# Patient Record
Sex: Female | Born: 1950 | Race: White | Hispanic: No | Smoking: Never smoker
Health system: Southern US, Community
[De-identification: ages and names within clinical notes are randomized; demographics above are authoritative.]

## PROBLEM LIST (undated history)

## (undated) DIAGNOSIS — K589 Irritable bowel syndrome without diarrhea: Secondary | ICD-10-CM

## (undated) DIAGNOSIS — E7800 Pure hypercholesterolemia, unspecified: Secondary | ICD-10-CM

## (undated) DIAGNOSIS — K209 Esophagitis, unspecified without bleeding: Secondary | ICD-10-CM

## (undated) DIAGNOSIS — G4730 Sleep apnea, unspecified: Secondary | ICD-10-CM

## (undated) DIAGNOSIS — R011 Cardiac murmur, unspecified: Secondary | ICD-10-CM

## (undated) DIAGNOSIS — I739 Peripheral vascular disease, unspecified: Secondary | ICD-10-CM

## (undated) DIAGNOSIS — J45909 Unspecified asthma, uncomplicated: Secondary | ICD-10-CM

## (undated) DIAGNOSIS — T4145XA Adverse effect of unspecified anesthetic, initial encounter: Secondary | ICD-10-CM

## (undated) DIAGNOSIS — Z86010 Personal history of colonic polyps: Secondary | ICD-10-CM

## (undated) DIAGNOSIS — T8859XA Other complications of anesthesia, initial encounter: Secondary | ICD-10-CM

## (undated) DIAGNOSIS — D510 Vitamin B12 deficiency anemia due to intrinsic factor deficiency: Secondary | ICD-10-CM

## (undated) DIAGNOSIS — J449 Chronic obstructive pulmonary disease, unspecified: Secondary | ICD-10-CM

## (undated) DIAGNOSIS — I10 Essential (primary) hypertension: Secondary | ICD-10-CM

## (undated) DIAGNOSIS — R634 Abnormal weight loss: Secondary | ICD-10-CM

## (undated) DIAGNOSIS — H269 Unspecified cataract: Secondary | ICD-10-CM

## (undated) DIAGNOSIS — Z87442 Personal history of urinary calculi: Secondary | ICD-10-CM

## (undated) DIAGNOSIS — K529 Noninfective gastroenteritis and colitis, unspecified: Secondary | ICD-10-CM

## (undated) DIAGNOSIS — K219 Gastro-esophageal reflux disease without esophagitis: Secondary | ICD-10-CM

## (undated) DIAGNOSIS — L4050 Arthropathic psoriasis, unspecified: Secondary | ICD-10-CM

## (undated) DIAGNOSIS — Z8774 Personal history of (corrected) congenital malformations of heart and circulatory system: Secondary | ICD-10-CM

## (undated) DIAGNOSIS — D649 Anemia, unspecified: Secondary | ICD-10-CM

## (undated) DIAGNOSIS — E119 Type 2 diabetes mellitus without complications: Secondary | ICD-10-CM

## (undated) DIAGNOSIS — J189 Pneumonia, unspecified organism: Secondary | ICD-10-CM

## (undated) DIAGNOSIS — Z8719 Personal history of other diseases of the digestive system: Secondary | ICD-10-CM

## (undated) DIAGNOSIS — R945 Abnormal results of liver function studies: Secondary | ICD-10-CM

## (undated) DIAGNOSIS — G709 Myoneural disorder, unspecified: Secondary | ICD-10-CM

## (undated) DIAGNOSIS — T7840XA Allergy, unspecified, initial encounter: Secondary | ICD-10-CM

## (undated) DIAGNOSIS — N200 Calculus of kidney: Secondary | ICD-10-CM

## (undated) DIAGNOSIS — D689 Coagulation defect, unspecified: Secondary | ICD-10-CM

## (undated) HISTORY — DX: Abnormal results of liver function studies: R94.5

## (undated) HISTORY — DX: Personal history of colonic polyps: Z86.010

## (undated) HISTORY — PX: CHOLECYSTECTOMY: SHX55

## (undated) HISTORY — DX: Pure hypercholesterolemia, unspecified: E78.00

## (undated) HISTORY — DX: Noninfective gastroenteritis and colitis, unspecified: K52.9

## (undated) HISTORY — DX: Arthropathic psoriasis, unspecified: L40.50

## (undated) HISTORY — PX: UMBILICAL HERNIA REPAIR: SHX196

## (undated) HISTORY — DX: Chronic obstructive pulmonary disease, unspecified: J44.9

## (undated) HISTORY — PX: NOSE SURGERY: SHX723

## (undated) HISTORY — DX: Type 2 diabetes mellitus without complications: E11.9

## (undated) HISTORY — DX: Allergy, unspecified, initial encounter: T78.40XA

## (undated) HISTORY — DX: Personal history of (corrected) congenital malformations of heart and circulatory system: Z87.74

## (undated) HISTORY — DX: Unspecified cataract: H26.9

## (undated) HISTORY — PX: ABDOMINAL HYSTERECTOMY: SHX81

## (undated) HISTORY — DX: Myoneural disorder, unspecified: G70.9

## (undated) HISTORY — PX: HAND SURGERY: SHX662

## (undated) HISTORY — PX: BUNIONECTOMY: SHX129

## (undated) HISTORY — DX: Esophagitis, unspecified without bleeding: K20.90

## (undated) HISTORY — DX: Irritable bowel syndrome, unspecified: K58.9

## (undated) HISTORY — DX: Vitamin B12 deficiency anemia due to intrinsic factor deficiency: D51.0

## (undated) HISTORY — DX: Essential (primary) hypertension: I10

## (undated) HISTORY — DX: Calculus of kidney: N20.0

## (undated) HISTORY — PX: ANKLE SURGERY: SHX546

## (undated) HISTORY — PX: BRAIN SURGERY: SHX531

## (undated) HISTORY — PX: HERNIA REPAIR: SHX51

## (undated) HISTORY — DX: Coagulation defect, unspecified: D68.9

## (undated) HISTORY — DX: Personal history of other diseases of the digestive system: Z87.19

## (undated) HISTORY — DX: Anemia, unspecified: D64.9

## (undated) HISTORY — DX: Peripheral vascular disease, unspecified: I73.9

## (undated) HISTORY — DX: Esophagitis, unspecified: K20.9

## (undated) HISTORY — DX: Sleep apnea, unspecified: G47.30

## (undated) HISTORY — PX: HEEL SPUR SURGERY: SHX665

## (undated) HISTORY — DX: Abnormal weight loss: R63.4

## (undated) HISTORY — PX: EYE SURGERY: SHX253

## (undated) MED FILL — Ferric Derisomaltose (One Dose) IV Sol 1000 MG/10ML (Fe Eq): INTRAVENOUS | Qty: 10 | Status: AC

---

## 1979-11-03 HISTORY — PX: ABDOMINAL HYSTERECTOMY: SHX81

## 1987-11-03 HISTORY — PX: CHOLECYSTECTOMY: SHX55

## 2004-09-10 ENCOUNTER — Ambulatory Visit: Payer: Self-pay

## 2004-09-11 ENCOUNTER — Ambulatory Visit: Payer: Self-pay

## 2005-09-07 ENCOUNTER — Ambulatory Visit: Payer: Self-pay | Admitting: Internal Medicine

## 2006-04-30 ENCOUNTER — Ambulatory Visit: Payer: Self-pay | Admitting: Podiatry

## 2007-01-10 ENCOUNTER — Ambulatory Visit: Payer: Self-pay | Admitting: Internal Medicine

## 2007-01-20 ENCOUNTER — Ambulatory Visit: Payer: Self-pay | Admitting: Internal Medicine

## 2007-11-29 ENCOUNTER — Ambulatory Visit: Payer: Self-pay | Admitting: Ophthalmology

## 2007-12-05 ENCOUNTER — Ambulatory Visit: Payer: Self-pay | Admitting: Ophthalmology

## 2008-01-23 ENCOUNTER — Ambulatory Visit: Payer: Self-pay | Admitting: Internal Medicine

## 2008-05-22 ENCOUNTER — Ambulatory Visit: Payer: Self-pay | Admitting: Family Medicine

## 2008-05-24 ENCOUNTER — Ambulatory Visit: Payer: Self-pay | Admitting: Internal Medicine

## 2008-05-29 ENCOUNTER — Ambulatory Visit: Payer: Self-pay | Admitting: Rheumatology

## 2008-12-26 ENCOUNTER — Ambulatory Visit: Payer: Self-pay | Admitting: Internal Medicine

## 2009-03-18 ENCOUNTER — Ambulatory Visit: Payer: Self-pay | Admitting: Internal Medicine

## 2009-04-02 ENCOUNTER — Ambulatory Visit: Payer: Self-pay | Admitting: Internal Medicine

## 2010-03-31 ENCOUNTER — Ambulatory Visit: Payer: Self-pay | Admitting: Internal Medicine

## 2010-11-26 ENCOUNTER — Encounter: Payer: Self-pay | Admitting: Rheumatology

## 2010-12-03 ENCOUNTER — Encounter: Payer: Self-pay | Admitting: Rheumatology

## 2011-04-23 ENCOUNTER — Ambulatory Visit: Payer: Self-pay | Admitting: Internal Medicine

## 2012-04-28 ENCOUNTER — Ambulatory Visit: Payer: Self-pay | Admitting: Internal Medicine

## 2012-04-29 ENCOUNTER — Ambulatory Visit: Payer: Self-pay | Admitting: Specialist

## 2012-05-10 ENCOUNTER — Encounter: Payer: Self-pay | Admitting: Specialist

## 2012-06-02 ENCOUNTER — Encounter: Payer: Self-pay | Admitting: Specialist

## 2012-06-29 DIAGNOSIS — K8050 Calculus of bile duct without cholangitis or cholecystitis without obstruction: Secondary | ICD-10-CM

## 2012-06-29 DIAGNOSIS — L4050 Arthropathic psoriasis, unspecified: Secondary | ICD-10-CM | POA: Insufficient documentation

## 2012-06-29 DIAGNOSIS — R748 Abnormal levels of other serum enzymes: Secondary | ICD-10-CM | POA: Insufficient documentation

## 2012-06-29 HISTORY — DX: Calculus of bile duct without cholangitis or cholecystitis without obstruction: K80.50

## 2012-07-03 ENCOUNTER — Encounter: Payer: Self-pay | Admitting: Specialist

## 2012-09-28 ENCOUNTER — Telehealth: Payer: Self-pay | Admitting: Internal Medicine

## 2012-09-28 NOTE — Telephone Encounter (Signed)
Refill request for accu check comp drum tes #102 Sig: check blood sugar 2 to 3 times a day Patient has an appt on 10/06/12

## 2012-10-04 ENCOUNTER — Encounter: Payer: Self-pay | Admitting: Internal Medicine

## 2012-10-06 ENCOUNTER — Encounter: Payer: Self-pay | Admitting: Internal Medicine

## 2012-10-06 ENCOUNTER — Ambulatory Visit (INDEPENDENT_AMBULATORY_CARE_PROVIDER_SITE_OTHER): Payer: Medicare Other | Admitting: Internal Medicine

## 2012-10-06 VITALS — BP 109/74 | HR 90 | Temp 98.0°F | Ht 65.0 in | Wt 167.0 lb

## 2012-10-06 DIAGNOSIS — K589 Irritable bowel syndrome without diarrhea: Secondary | ICD-10-CM

## 2012-10-06 DIAGNOSIS — R945 Abnormal results of liver function studies: Secondary | ICD-10-CM

## 2012-10-06 DIAGNOSIS — E119 Type 2 diabetes mellitus without complications: Secondary | ICD-10-CM

## 2012-10-06 DIAGNOSIS — K5520 Angiodysplasia of colon without hemorrhage: Secondary | ICD-10-CM

## 2012-10-06 DIAGNOSIS — D649 Anemia, unspecified: Secondary | ICD-10-CM

## 2012-10-06 DIAGNOSIS — R7989 Other specified abnormal findings of blood chemistry: Secondary | ICD-10-CM

## 2012-10-06 DIAGNOSIS — I10 Essential (primary) hypertension: Secondary | ICD-10-CM

## 2012-10-06 DIAGNOSIS — M8580 Other specified disorders of bone density and structure, unspecified site: Secondary | ICD-10-CM

## 2012-10-06 DIAGNOSIS — E7800 Pure hypercholesterolemia, unspecified: Secondary | ICD-10-CM

## 2012-10-06 DIAGNOSIS — K219 Gastro-esophageal reflux disease without esophagitis: Secondary | ICD-10-CM

## 2012-10-06 DIAGNOSIS — Q2733 Arteriovenous malformation of digestive system vessel: Secondary | ICD-10-CM

## 2012-10-06 DIAGNOSIS — M899 Disorder of bone, unspecified: Secondary | ICD-10-CM

## 2012-10-06 HISTORY — DX: Abnormal results of liver function studies: R94.5

## 2012-10-06 HISTORY — DX: Other specified abnormal findings of blood chemistry: R79.89

## 2012-10-06 MED ORDER — TRIAMTERENE-HCTZ 37.5-25 MG PO TABS: 1.0000 | ORAL_TABLET | Freq: Every day | ORAL | Status: DC

## 2012-10-06 MED ORDER — PIOGLITAZONE HCL 15 MG PO TABS: 15.0000 mg | ORAL_TABLET | Freq: Every day | ORAL | Status: DC

## 2012-10-06 MED ORDER — METFORMIN HCL 1000 MG PO TABS: 1000.0000 mg | ORAL_TABLET | Freq: Two times a day (BID) | ORAL | Status: DC

## 2012-10-06 MED ORDER — COLESEVELAM HCL 625 MG PO TABS: ORAL_TABLET | ORAL | Status: DC

## 2012-10-06 MED ORDER — OMEPRAZOLE 20 MG PO CPDR: 20.0000 mg | DELAYED_RELEASE_CAPSULE | Freq: Every day | ORAL | Status: DC

## 2012-10-06 MED ORDER — AMLODIPINE BESYLATE 5 MG PO TABS: 5.0000 mg | ORAL_TABLET | Freq: Two times a day (BID) | ORAL | Status: DC

## 2012-10-06 MED ORDER — CITALOPRAM HYDROBROMIDE 10 MG PO TABS: 10.0000 mg | ORAL_TABLET | Freq: Every day | ORAL | Status: DC

## 2012-10-06 MED ORDER — LISINOPRIL 40 MG PO TABS: 40.0000 mg | ORAL_TABLET | Freq: Every day | ORAL | Status: DC

## 2012-10-06 MED ORDER — GLIPIZIDE ER 5 MG PO TB24: 5.0000 mg | ORAL_TABLET | Freq: Every day | ORAL | Status: DC

## 2012-10-06 NOTE — Assessment & Plan Note (Addendum)
Had abdominal ultrasound previously.  Discussed with Dr Wilson.  Follow liver panel.  Diagnosed with fatty liver.    

## 2012-10-06 NOTE — Patient Instructions (Addendum)
It was nice seeing you today.  I want you to decrease the glucotrol (glipizide) to 5mg  q day.  Record sugars and send to me in a few days.  Let me know if problems.

## 2012-10-06 NOTE — Progress Notes (Signed)
Subjective:    Patient ID: Ashley Gross, female    DOB: 03/24/1951, 61 y.o.   MRN: [id number]  HPI 61 year old female with past history of hypertension, diabetes and psoriatic arthritis who comes in today for a scheduled follow up.  She saw Dr Dickie La for an abnormal ultrasound finding.  Diagnosed with fatty liver.  No obstruction found.  Off Humira.  Starting a new medication for her arthritis (Aprava). She is trying to watch what she eats.  Sugars have been doing well.  (am sugars averaging 110-120 and pm sugars 120).  Some lows at times.  Breathing stable.  Bowels stable.  Eating and drinking ok.  No abdominal pain or cramping.    Past Medical History  Diagnosis Date  . Pure hypercholesterolemia   . Pernicious anemia   . Psoriatic arthritis     s/p penicillamine, plaquenil, MTX, sulfasalazine.  s/p Embrel, Humira,.  Iritis.    . Hypertension   . Anemia, unspecified   . IBS (irritable bowel syndrome)   . Nephrolithiasis   . H/O hiatal hernia   . Colitis   . Hx of arteriovenous malformation (AVM)   . Diabetes mellitus   . Esophagitis      Current Outpatient Prescriptions on File Prior to Visit  Medication Sig Dispense Refill  . albuterol (PROVENTIL HFA;VENTOLIN HFA) 108 (90 BASE) MCG/ACT inhaler Inhale 2 puffs into the lungs every 6 (six) hours as needed.      Marland Kitchen amLODipine (NORVASC) 5 MG tablet Take 1 tablet (5 mg total) by mouth 2 (two) times daily.  180 tablet  3  . Calcium Carbonate-Vitamin D (CALTRATE 600+D) 600-400 MG-UNIT per tablet Take 1 tablet by mouth 2 (two) times daily.      . citalopram (CELEXA) 10 MG tablet Take 1 tablet (10 mg total) by mouth daily.  90 tablet  3  . colesevelam (WELCHOL) 625 MG tablet Three tablets bid  540 tablet  3  . fluticasone (FLONASE) 50 MCG/ACT nasal spray Place 2 sprays into the nose daily.      . Fluticasone-Salmeterol (ADVAIR DISKUS) 250-50 MCG/DOSE AEPB Inhale 1 puff into the lungs every 12 (twelve) hours.      Marland Kitchen ipratropium (ATROVENT HFA)  17 MCG/ACT inhaler Inhale 2 puffs into the lungs every 6 (six) hours.      Marland Kitchen leflunomide (ARAVA) 10 MG tablet Take 10 mg by mouth daily.      Marland Kitchen lisinopril (PRINIVIL,ZESTRIL) 40 MG tablet Take 1 tablet (40 mg total) by mouth daily.  90 tablet  3  . metFORMIN (GLUCOPHAGE) 1000 MG tablet Take 1 tablet (1,000 mg total) by mouth 2 (two) times daily with a meal.  180 tablet  3  . omeprazole (PRILOSEC) 20 MG capsule Take 1 capsule (20 mg total) by mouth daily.  90 capsule  3  . pioglitazone (ACTOS) 15 MG tablet Take 1 tablet (15 mg total) by mouth daily.  90 tablet  3  . triamterene-hydrochlorothiazide (MAXZIDE-25) 37.5-25 MG per tablet Take 1 each (1 tablet total) by mouth daily.  90 tablet  3  . montelukast (SINGULAIR) 10 MG tablet Take 10 mg by mouth daily.        Review of Systems Patient denies any headache, lightheadedness or dizziness. No sinus or allergy symptoms.   No chest pain, tightness or palpitations.  No increased shortness of breath, cough or congestion.  No nausea or vomiting.  No abdominal pain or cramping.  No bowel change.  Bowels stable.  No  BRBPR or melana.  No urine change.        Objective:   Physical Exam Filed Vitals:   10/06/12 1403  BP: 109/74  Pulse: 90  Temp: 98 F (62.77 C)   61 year old female in no acute distress.   HEENT:  Nares - clear.  OP- without lesions or erythema.  NECK:  Supple, nontender.  No audible abdominal bruit.   HEART:  Appears to be regular. LUNGS:  Without crackles or wheezing audible.  Respirations even and unlabored.   RADIAL PULSE:  Equal bilaterally.  ABDOMEN:  Soft, nontender.  No audible abdominal bruit.   EXTREMITIES:  No increased edema to be present.                  Assessment & Plan:  CARDIOVASCULAR.  Stable.  Currently asymptomatic. Continue risk factor modification.   PULMONARY.  Breathing stable.  Follow.    HEALTH MAINTENANCE.  Physical 04/14/12.  She is s/p hysterectomy.  Need results of latest mammogram.  Sees Dr Andrey Campanile  and is up to date with her colonoscopy.

## 2012-10-06 NOTE — Assessment & Plan Note (Signed)
Follow cbc.  

## 2012-10-06 NOTE — Assessment & Plan Note (Signed)
Last bone density revealed osteopenia with no significant change form previous.  Continue calcium and vitamin D.   

## 2012-10-09 ENCOUNTER — Encounter: Payer: Self-pay | Admitting: Internal Medicine

## 2012-10-09 NOTE — Assessment & Plan Note (Signed)
Reflux controlled.  On omeprazole.  Follow.   

## 2012-10-09 NOTE — Assessment & Plan Note (Signed)
Some lows at times.  Will decrease Glipizide XL to 5mg  q day.  Follow sugars.  Eat regular meals.  Record sugars.

## 2012-10-09 NOTE — Assessment & Plan Note (Signed)
Stable.  Continue to follow up with Dr Andrey Campanile.  Currently stable.

## 2012-10-09 NOTE — Assessment & Plan Note (Signed)
Low cholesterol diet and exercise.  Follow lipid panel.   

## 2012-10-09 NOTE — Assessment & Plan Note (Signed)
Blood pressure under good control.  Same medication.  Follow metabolic panel.   

## 2012-10-09 NOTE — Assessment & Plan Note (Signed)
Stable.  Follow.   

## 2012-10-13 ENCOUNTER — Other Ambulatory Visit: Payer: Medicare Other

## 2012-10-14 ENCOUNTER — Encounter: Payer: Self-pay | Admitting: *Deleted

## 2012-10-14 ENCOUNTER — Other Ambulatory Visit: Payer: Self-pay | Admitting: *Deleted

## 2012-10-14 NOTE — Telephone Encounter (Signed)
Opened in error

## 2012-10-14 NOTE — Telephone Encounter (Signed)
Left message on husbands vm for patient to call office

## 2013-01-09 ENCOUNTER — Ambulatory Visit (INDEPENDENT_AMBULATORY_CARE_PROVIDER_SITE_OTHER): Payer: Medicare Other | Admitting: Internal Medicine

## 2013-01-09 ENCOUNTER — Encounter: Payer: Self-pay | Admitting: Internal Medicine

## 2013-01-09 VITALS — BP 112/64 | HR 93 | Temp 98.5°F | Ht 65.0 in | Wt 168.0 lb

## 2013-01-09 DIAGNOSIS — I10 Essential (primary) hypertension: Secondary | ICD-10-CM

## 2013-01-09 DIAGNOSIS — D649 Anemia, unspecified: Secondary | ICD-10-CM

## 2013-01-09 DIAGNOSIS — R197 Diarrhea, unspecified: Secondary | ICD-10-CM

## 2013-01-09 DIAGNOSIS — E119 Type 2 diabetes mellitus without complications: Secondary | ICD-10-CM

## 2013-01-09 DIAGNOSIS — R7989 Other specified abnormal findings of blood chemistry: Secondary | ICD-10-CM

## 2013-01-09 DIAGNOSIS — E7800 Pure hypercholesterolemia, unspecified: Secondary | ICD-10-CM

## 2013-01-09 DIAGNOSIS — M8580 Other specified disorders of bone density and structure, unspecified site: Secondary | ICD-10-CM

## 2013-01-09 DIAGNOSIS — K589 Irritable bowel syndrome without diarrhea: Secondary | ICD-10-CM

## 2013-01-09 DIAGNOSIS — Q2733 Arteriovenous malformation of digestive system vessel: Secondary | ICD-10-CM

## 2013-01-09 DIAGNOSIS — M949 Disorder of cartilage, unspecified: Secondary | ICD-10-CM

## 2013-01-09 DIAGNOSIS — R945 Abnormal results of liver function studies: Secondary | ICD-10-CM

## 2013-01-09 DIAGNOSIS — K219 Gastro-esophageal reflux disease without esophagitis: Secondary | ICD-10-CM

## 2013-01-09 DIAGNOSIS — M899 Disorder of bone, unspecified: Secondary | ICD-10-CM

## 2013-01-09 DIAGNOSIS — K5520 Angiodysplasia of colon without hemorrhage: Secondary | ICD-10-CM

## 2013-01-09 NOTE — Assessment & Plan Note (Addendum)
Decreased Glipizide XL to 5mg  q day last visit.  No problems with lows now.  Follow sugars.  Eat regular meals.  Check metabolic panel and a1c.

## 2013-01-09 NOTE — Progress Notes (Signed)
Subjective:    Patient ID: Ashley Gross, female    DOB: Aug 29, 1951, 62 y.o.   MRN: [id number]  HPI  62 year old female with past history of hypertension, diabetes and psoriatic arthritis who comes in today for a scheduled follow up.  She saw Dr Dickie La for an abnormal ultrasound finding.  Diagnosed with fatty liver.  No obstruction found.  Off Humira.  Starting a new medication for her arthritis (Aprava). She is trying to watch what she eats.  Sugars have been doing well.  (am sugars averaging 110-120 and pm sugars 120).  Some lows at times.  Breathing stable.  Bowels stable.  Eating and drinking ok.  No abdominal pain or cramping.    Past Medical History  Diagnosis Date  . Pure hypercholesterolemia   . Pernicious anemia   . Psoriatic arthritis     s/p penicillamine, plaquenil, MTX, sulfasalazine.  s/p Embrel, Humira,.  Iritis.    . Hypertension   . Anemia, unspecified   . IBS (irritable bowel syndrome)   . Nephrolithiasis   . H/O hiatal hernia   . Colitis   . Hx of arteriovenous malformation (AVM)   . Diabetes mellitus   . Esophagitis      Current Outpatient Prescriptions on File Prior to Visit  Medication Sig Dispense Refill  . albuterol (PROVENTIL HFA;VENTOLIN HFA) 108 (90 BASE) MCG/ACT inhaler Inhale 2 puffs into the lungs every 6 (six) hours as needed.      Marland Kitchen amLODipine (NORVASC) 5 MG tablet Take 1 tablet (5 mg total) by mouth 2 (two) times daily.  180 tablet  3  . Calcium Carbonate-Vitamin D (CALTRATE 600+D) 600-400 MG-UNIT per tablet Take 1 tablet by mouth 2 (two) times daily.      . Cholecalciferol (VITAMIN D-3) 1000 UNITS CAPS Take 1 capsule by mouth daily.      . citalopram (CELEXA) 10 MG tablet Take 1 tablet (10 mg total) by mouth daily.  90 tablet  3  . colesevelam (WELCHOL) 625 MG tablet Three tablets bid  540 tablet  3  . Fluticasone-Salmeterol (ADVAIR DISKUS) 250-50 MCG/DOSE AEPB Inhale 1 puff into the lungs every 12 (twelve) hours.      Marland Kitchen glipiZIDE (GLIPIZIDE XL) 5  MG 24 hr tablet Take 1 tablet (5 mg total) by mouth daily.  90 tablet  3  . glucose blood test strip accu-chek compact test strip Check blood sugar tid      . ipratropium (ATROVENT HFA) 17 MCG/ACT inhaler Inhale 2 puffs into the lungs every 6 (six) hours.      Marland Kitchen leflunomide (ARAVA) 10 MG tablet Take 10 mg by mouth daily.      Marland Kitchen lisinopril (PRINIVIL,ZESTRIL) 40 MG tablet Take 1 tablet (40 mg total) by mouth daily.  90 tablet  3  . metFORMIN (GLUCOPHAGE) 1000 MG tablet Take 1 tablet (1,000 mg total) by mouth 2 (two) times daily with a meal.  180 tablet  3  . omeprazole (PRILOSEC) 20 MG capsule Take 1 capsule (20 mg total) by mouth daily.  90 capsule  3  . pioglitazone (ACTOS) 15 MG tablet Take 1 tablet (15 mg total) by mouth daily.  90 tablet  3  . triamterene-hydrochlorothiazide (MAXZIDE-25) 37.5-25 MG per tablet Take 1 each (1 tablet total) by mouth daily.  90 tablet  3  . cyclobenzaprine (FLEXERIL) 10 MG tablet Take 10 mg by mouth 2 (two) times daily as needed.      . fluticasone (FLONASE) 50 MCG/ACT nasal spray  Place 2 sprays into the nose daily.      . montelukast (SINGULAIR) 10 MG tablet Take 10 mg by mouth daily.       No current facility-administered medications on file prior to visit.    Review of Systems Patient denies any headache, lightheadedness or dizziness. No sinus or allergy symptoms.   No chest pain, tightness or palpitations.  No increased shortness of breath, cough or congestion.  No nausea or vomiting.  Increased flares with her bowels.  Increased loose stool.  Multiple episodes of loose stool per day.  Taking immodium.   No BRBPR or melana.  No urine change.  Sugars doing well.  No significant lows.  AM sugar averaging 120-130 and PM sugars 120-130s.        Objective:   Physical Exam  Filed Vitals:   01/09/13 1426  BP: 112/64  Pulse: 93  Temp: 98.5 F (36.9 C)   Blood pressure recheck:  122/78, pulse 83  62 year old female in no acute distress.   HEENT:  Nares -  clear.  OP- without lesions or erythema.  NECK:  Supple, nontender.  No audible abdominal bruit.   HEART:  Appears to be regular. LUNGS:  Without crackles or wheezing audible.  Respirations even and unlabored.   RADIAL PULSE:  Equal bilaterally.  ABDOMEN:  Soft, nontender.  No audible abdominal bruit.   EXTREMITIES:  No increased edema to be present.                  Assessment & Plan:  CARDIOVASCULAR.  Stable.  Currently asymptomatic. Continue risk factor modification.   PULMONARY.  Breathing stable.  Follow.    HEALTH MAINTENANCE.  Physical 04/14/12.  She is s/p hysterectomy.  Need results of latest mammogram.  Sees Dr Andrey Campanile and states she is due a colonoscopy.  Schedule an appt.

## 2013-01-09 NOTE — Assessment & Plan Note (Signed)
Had abdominal ultrasound previously.  Discussed with Dr Wilson.  Follow liver panel.  Diagnosed with fatty liver.    

## 2013-01-09 NOTE — Assessment & Plan Note (Signed)
Reflux controlled.  On omeprazole.  Follow.   

## 2013-01-09 NOTE — Assessment & Plan Note (Signed)
Increased loose stool as outlined. States due a follow up colonoscopy.  Will schedule an appt with Dr Andrey Campanile.  Will also discuss with Dr Gavin Potters - regarding her arthritis treatment.  She thought bowels better on Humira.

## 2013-01-09 NOTE — Assessment & Plan Note (Addendum)
Last bone density revealed osteopenia with no significant change form previous.  Continue calcium and vitamin D.  Check vitamin D level.

## 2013-01-09 NOTE — Assessment & Plan Note (Signed)
Stable.  Follow.   

## 2013-01-09 NOTE — Assessment & Plan Note (Signed)
Low cholesterol diet and exercise.  Follow lipid panel.   

## 2013-01-09 NOTE — Assessment & Plan Note (Signed)
Follow cbc.  

## 2013-01-09 NOTE — Assessment & Plan Note (Signed)
Blood pressure under good control.  Same medication.  Follow metabolic panel.   

## 2013-01-11 ENCOUNTER — Other Ambulatory Visit (INDEPENDENT_AMBULATORY_CARE_PROVIDER_SITE_OTHER): Payer: Medicare Other

## 2013-01-11 DIAGNOSIS — E119 Type 2 diabetes mellitus without complications: Secondary | ICD-10-CM

## 2013-01-11 DIAGNOSIS — M8580 Other specified disorders of bone density and structure, unspecified site: Secondary | ICD-10-CM

## 2013-01-11 DIAGNOSIS — R945 Abnormal results of liver function studies: Secondary | ICD-10-CM

## 2013-01-11 DIAGNOSIS — D649 Anemia, unspecified: Secondary | ICD-10-CM

## 2013-01-11 DIAGNOSIS — R7989 Other specified abnormal findings of blood chemistry: Secondary | ICD-10-CM

## 2013-01-11 DIAGNOSIS — E7800 Pure hypercholesterolemia, unspecified: Secondary | ICD-10-CM

## 2013-01-11 DIAGNOSIS — M899 Disorder of bone, unspecified: Secondary | ICD-10-CM

## 2013-01-11 LAB — CBC WITH DIFFERENTIAL/PLATELET
Basophils Relative: 1.1 % (ref 0.0–3.0)
Eosinophils Absolute: 0.1 10*3/uL (ref 0.0–0.7)
Eosinophils Relative: 2.2 % (ref 0.0–5.0)
HCT: 36.9 % (ref 36.0–46.0)
Hemoglobin: 12.2 g/dL (ref 12.0–15.0)
Lymphs Abs: 1.3 10*3/uL (ref 0.7–4.0)
MCHC: 33 g/dL (ref 30.0–36.0)
MCV: 86.5 fl (ref 78.0–100.0)
Monocytes Absolute: 0.5 10*3/uL (ref 0.1–1.0)
Neutro Abs: 3.9 10*3/uL (ref 1.4–7.7)
Neutrophils Relative %: 65.8 % (ref 43.0–77.0)
RBC: 4.27 Mil/uL (ref 3.87–5.11)
WBC: 5.9 10*3/uL (ref 4.5–10.5)

## 2013-01-11 LAB — LIPID PANEL
HDL: 65.7 mg/dL (ref >39.00)
Total CHOL/HDL Ratio: 4
Triglycerides: 164 mg/dL — ABNORMAL HIGH (ref 0.0–149.0)
VLDL: 32.8 mg/dL (ref 0.0–40.0)

## 2013-01-11 LAB — BASIC METABOLIC PANEL
BUN: 17 mg/dL (ref 6–23)
CO2: 30 mEq/L (ref 19–32)
GFR: 52.94 mL/min — ABNORMAL LOW (ref >60.00)
Glucose, Bld: 170 mg/dL — ABNORMAL HIGH (ref 70–99)
Potassium: 4 mEq/L (ref 3.5–5.1)
Sodium: 139 mEq/L (ref 135–145)

## 2013-01-11 LAB — LDL CHOLESTEROL, DIRECT: Direct LDL: 171.3 mg/dL

## 2013-01-11 LAB — HEPATIC FUNCTION PANEL
Bilirubin, Direct: 0.1 mg/dL (ref 0.0–0.3)
Total Bilirubin: 0.8 mg/dL (ref 0.3–1.2)

## 2013-01-11 LAB — MICROALBUMIN / CREATININE URINE RATIO
Creatinine,U: 177.8 mg/dL
Microalb Creat Ratio: 0.3 mg/g (ref 0.0–30.0)

## 2013-01-19 ENCOUNTER — Telehealth: Payer: Self-pay | Admitting: Internal Medicine

## 2013-01-19 NOTE — Telephone Encounter (Signed)
Asking for Dr. Lorin Picket to call her.  Had an office visit recently and Dr. Lorin Picket was going to be getting in touch with her gastrologist at [facility].  Pt asking to talk to Dr. Lorin Picket about some ongoing problems.  Please advise.

## 2013-01-20 NOTE — Telephone Encounter (Signed)
Patient states that her diarrhea is worse. She said she has been going to the bathroom 12 to 13 times a day and it has been up to 20 times. First thing in the morning her stool is normal color then goes from dark green to black by evening.Patient states that stool has mucus in it but has not seen any blood.  Patient says that when she eats she is having stomach cramps. Patient has been using imodium and feels that it is not helping.

## 2013-01-20 NOTE — Telephone Encounter (Signed)
I wrote a prescription for stool studies for her to pick up the cups and take back to the hospital.  They will run at the hospital.  We can fax order.  She may want to wait until Monday to pick up the cups - so if there is a question they can call our office.  If any acute problems,  Needs eval over the weekend.

## 2013-01-20 NOTE — Telephone Encounter (Signed)
Please notify pt that I have not heard from Dr Andrey Campanile.  Please find out what her ongoing issues are.  Thanks.

## 2013-01-20 NOTE — Telephone Encounter (Signed)
Pt advised Dr. Lorin Picket has not spoken with Dr. Andrey Campanile at this time.  Pt states that her symptoms have gotten worse since she was seen on the 10th.

## 2013-01-20 NOTE — Telephone Encounter (Signed)
Let her know that I will let her know something when I hear from Dr Andrey Campanile.  If she is acutely worse or feels getting dehydrated - needs eval - would recommend acute care or ER.  Also can obtain stool studies - to confirm no other infectious cause.  I can place order for stool studies if she would like to do.  Would have her do these at the hopital.  I can send order to the hospital if desires.

## 2013-01-20 NOTE — Telephone Encounter (Signed)
Patient notified. Patient stated that she would like to have the stool studies.

## 2013-01-20 NOTE — Telephone Encounter (Signed)
Faxed order

## 2013-01-30 ENCOUNTER — Telehealth: Payer: Self-pay | Admitting: *Deleted

## 2013-01-30 NOTE — Telephone Encounter (Signed)
Called patient to let her know that stool studies order has been faxed. Gave patient directions to lab in [facility]. Also told her that Dr. Gavin Potters said that she can go back on Humira after she checks with her insurance company. Gave patient office number to Dr. Tawana Scale who have been trying to get in touch with her. [phone].

## 2013-02-02 ENCOUNTER — Other Ambulatory Visit: Payer: Self-pay | Admitting: Internal Medicine

## 2013-02-02 ENCOUNTER — Telehealth: Payer: Self-pay | Admitting: Internal Medicine

## 2013-02-02 NOTE — Telephone Encounter (Signed)
[facility] labs in box °

## 2013-02-03 ENCOUNTER — Telehealth: Payer: Self-pay | Admitting: *Deleted

## 2013-02-03 ENCOUNTER — Telehealth: Payer: Self-pay | Admitting: Internal Medicine

## 2013-02-03 LAB — WBCS, STOOL

## 2013-02-03 NOTE — Telephone Encounter (Signed)
Called patient with her c-diff results. Patient wanted you to know that she is due for colonoscopy 4/9 with Dr. Lucretia Roers.

## 2013-02-03 NOTE — Telephone Encounter (Signed)
Labs from [facility] in box °

## 2013-02-03 NOTE — Telephone Encounter (Signed)
noted 

## 2013-02-06 ENCOUNTER — Telehealth: Payer: Self-pay | Admitting: *Deleted

## 2013-02-06 ENCOUNTER — Telehealth: Payer: Self-pay | Admitting: Internal Medicine

## 2013-02-06 NOTE — Telephone Encounter (Signed)
[facility] labs in box °

## 2013-02-06 NOTE — Telephone Encounter (Signed)
Called patient with results for her stool study.

## 2013-02-06 NOTE — Telephone Encounter (Signed)
noted 

## 2013-02-10 ENCOUNTER — Encounter: Payer: Self-pay | Admitting: Internal Medicine

## 2013-02-10 DIAGNOSIS — Z860100 Personal history of colon polyps, unspecified: Secondary | ICD-10-CM | POA: Insufficient documentation

## 2013-02-10 HISTORY — DX: Personal history of colonic polyps: Z86.010

## 2013-02-10 HISTORY — DX: Personal history of colon polyps, unspecified: Z86.0100

## 2013-02-16 ENCOUNTER — Encounter: Payer: Self-pay | Admitting: Internal Medicine

## 2013-02-16 DIAGNOSIS — K529 Noninfective gastroenteritis and colitis, unspecified: Secondary | ICD-10-CM | POA: Insufficient documentation

## 2013-02-16 DIAGNOSIS — K5140 Inflammatory polyps of colon without complications: Secondary | ICD-10-CM | POA: Insufficient documentation

## 2013-02-16 HISTORY — DX: Noninfective gastroenteritis and colitis, unspecified: K52.9

## 2013-02-22 ENCOUNTER — Encounter: Payer: Self-pay | Admitting: Internal Medicine

## 2013-03-01 ENCOUNTER — Encounter: Payer: Self-pay | Admitting: Internal Medicine

## 2013-03-14 ENCOUNTER — Encounter: Payer: Self-pay | Admitting: Internal Medicine

## 2013-04-19 ENCOUNTER — Encounter: Payer: Medicare Other | Admitting: Internal Medicine

## 2013-05-15 ENCOUNTER — Other Ambulatory Visit: Payer: Self-pay | Admitting: *Deleted

## 2013-05-15 ENCOUNTER — Telehealth: Payer: Self-pay | Admitting: Internal Medicine

## 2013-05-15 MED ORDER — GLUCOSE BLOOD VI STRP: ORAL_STRIP | Status: DC

## 2013-05-15 NOTE — Telephone Encounter (Signed)
Pt called asking if we could call her in some refills on her accu-check strips. Please advise. Please use the [facility] on [address]

## 2013-05-15 NOTE — Telephone Encounter (Signed)
Refilled electronically 

## 2013-05-15 NOTE — Telephone Encounter (Signed)
rx written - in your box.

## 2013-05-23 ENCOUNTER — Ambulatory Visit (INDEPENDENT_AMBULATORY_CARE_PROVIDER_SITE_OTHER): Payer: Medicare Other | Admitting: Internal Medicine

## 2013-05-23 ENCOUNTER — Encounter: Payer: Self-pay | Admitting: Internal Medicine

## 2013-05-23 VITALS — BP 120/70 | HR 71 | Temp 98.4°F | Ht 64.0 in | Wt 159.2 lb

## 2013-05-23 DIAGNOSIS — R7989 Other specified abnormal findings of blood chemistry: Secondary | ICD-10-CM

## 2013-05-23 DIAGNOSIS — K589 Irritable bowel syndrome without diarrhea: Secondary | ICD-10-CM

## 2013-05-23 DIAGNOSIS — K219 Gastro-esophageal reflux disease without esophagitis: Secondary | ICD-10-CM

## 2013-05-23 DIAGNOSIS — Q2733 Arteriovenous malformation of digestive system vessel: Secondary | ICD-10-CM

## 2013-05-23 DIAGNOSIS — K769 Liver disease, unspecified: Secondary | ICD-10-CM

## 2013-05-23 DIAGNOSIS — M8580 Other specified disorders of bone density and structure, unspecified site: Secondary | ICD-10-CM

## 2013-05-23 DIAGNOSIS — R945 Abnormal results of liver function studies: Secondary | ICD-10-CM

## 2013-05-23 DIAGNOSIS — E119 Type 2 diabetes mellitus without complications: Secondary | ICD-10-CM

## 2013-05-23 DIAGNOSIS — D649 Anemia, unspecified: Secondary | ICD-10-CM

## 2013-05-23 DIAGNOSIS — Z86010 Personal history of colonic polyps: Secondary | ICD-10-CM

## 2013-05-23 DIAGNOSIS — I10 Essential (primary) hypertension: Secondary | ICD-10-CM

## 2013-05-23 DIAGNOSIS — E7800 Pure hypercholesterolemia, unspecified: Secondary | ICD-10-CM

## 2013-05-23 DIAGNOSIS — K5520 Angiodysplasia of colon without hemorrhage: Secondary | ICD-10-CM

## 2013-05-23 DIAGNOSIS — M899 Disorder of bone, unspecified: Secondary | ICD-10-CM

## 2013-05-24 ENCOUNTER — Encounter: Payer: Self-pay | Admitting: Internal Medicine

## 2013-05-24 NOTE — Assessment & Plan Note (Signed)
Blood pressure under good control.  Same medication.  Follow metabolic panel.   

## 2013-05-24 NOTE — Assessment & Plan Note (Signed)
Low cholesterol diet and exercise.  Follow lipid panel.   

## 2013-05-24 NOTE — Assessment & Plan Note (Signed)
Increased loose stool as outlined.  Just had colonoscopy as outlined.   Has follow up planned with Dr Andrey Campanile this week.

## 2013-05-24 NOTE — Assessment & Plan Note (Signed)
No significant problems with lows now.  Follow sugars.  Eat regular meals.  Check metabolic panel and a1c.

## 2013-05-24 NOTE — Assessment & Plan Note (Signed)
Colonoscopy as outlined.  Continue to follow up with Dr Wilson.   

## 2013-05-24 NOTE — Assessment & Plan Note (Signed)
Had abdominal ultrasound previously.  Discussed with Dr Wilson.  Follow liver panel.  Diagnosed with fatty liver.    

## 2013-05-24 NOTE — Assessment & Plan Note (Signed)
Last bone density revealed osteopenia with no significant change form previous.  Continue calcium and vitamin D.  Check vitamin D level.

## 2013-05-24 NOTE — Assessment & Plan Note (Signed)
Reflux controlled.  On omeprazole.  Follow.   

## 2013-05-24 NOTE — Assessment & Plan Note (Signed)
Follow cbc.  

## 2013-05-24 NOTE — Progress Notes (Signed)
Subjective:    Patient ID: Ashley Gross, female    DOB: 10/05/1951, 62 y.o.   MRN: [id number]  HPI 62 year old female with past history of hypertension, diabetes and psoriatic arthritis who comes in today to follow up on these issues as well as for a complete physical exam.  She saw Dr Dickie La for an abnormal ultrasound finding.  Diagnosed with fatty liver.  No obstruction found.  Off Humira.  She is trying to watch what she eats.  Sugars have been doing well.  Some lows at times, but states this appears to only occur if she does not eat and then gets active.   Breathing stable.  Eating and drinking ok.  She is still having the bowel issues.  Due to follow up with Dr Andrey Campanile this week.  Has had some weight loss.  She just recently performed a colonoscopy.  Waiting on breath test results.  If unrevealing, may need MRCP.  Has f/u planned with Dr Gavin Potters in 9/14.      Past Medical History  Diagnosis Date  . Pure hypercholesterolemia   . Pernicious anemia   . Psoriatic arthritis     s/p penicillamine, plaquenil, MTX, sulfasalazine.  s/p Embrel, Humira,.  Iritis.    . Hypertension   . Anemia, unspecified   . IBS (irritable bowel syndrome)   . Nephrolithiasis   . H/O hiatal hernia   . Colitis   . Hx of arteriovenous malformation (AVM)   . Diabetes mellitus   . Esophagitis      Current Outpatient Prescriptions on File Prior to Visit  Medication Sig Dispense Refill  . albuterol (PROVENTIL HFA;VENTOLIN HFA) 108 (90 BASE) MCG/ACT inhaler Inhale 2 puffs into the lungs every 6 (six) hours as needed.      Marland Kitchen amLODipine (NORVASC) 5 MG tablet Take 1 tablet (5 mg total) by mouth 2 (two) times daily.  180 tablet  3  . Calcium Carbonate-Vitamin D (CALTRATE 600+D) 600-400 MG-UNIT per tablet Take 1 tablet by mouth 2 (two) times daily.      . Cholecalciferol (VITAMIN D-3) 1000 UNITS CAPS Take 1 capsule by mouth daily.      . citalopram (CELEXA) 10 MG tablet Take 1 tablet (10 mg total) by mouth daily.  90  tablet  3  . fluticasone (FLONASE) 50 MCG/ACT nasal spray Place 2 sprays into the nose daily.      . Fluticasone-Salmeterol (ADVAIR DISKUS) 250-50 MCG/DOSE AEPB Inhale 1 puff into the lungs every 12 (twelve) hours.      Marland Kitchen glipiZIDE (GLIPIZIDE XL) 5 MG 24 hr tablet Take 1 tablet (5 mg total) by mouth daily.  90 tablet  3  . glucose blood test strip accu-chek compact test strip Check blood sugars daily  100 each  12  . ipratropium (ATROVENT HFA) 17 MCG/ACT inhaler Inhale 2 puffs into the lungs every 6 (six) hours.      Marland Kitchen lisinopril (PRINIVIL,ZESTRIL) 40 MG tablet Take 1 tablet (40 mg total) by mouth daily.  90 tablet  3  . metFORMIN (GLUCOPHAGE) 1000 MG tablet Take 1 tablet (1,000 mg total) by mouth 2 (two) times daily with a meal.  180 tablet  3  . omeprazole (PRILOSEC) 20 MG capsule Take 1 capsule (20 mg total) by mouth daily.  90 capsule  3  . pioglitazone (ACTOS) 15 MG tablet Take 1 tablet (15 mg total) by mouth daily.  90 tablet  3  . triamterene-hydrochlorothiazide (MAXZIDE-25) 37.5-25 MG per tablet Take 1 each (  1 tablet total) by mouth daily.  90 tablet  3   No current facility-administered medications on file prior to visit.    Review of Systems Patient denies any headache, lightheadedness or dizziness. No sinus or allergy symptoms.   No chest pain, tightness or palpitations.  No increased shortness of breath, cough or congestion.  No nausea or vomiting.  Increased flares with her bowels.  Increased loose stool.  Multiple episodes of loose stool per day.  Taking immodium.   No BRBPR or melana.  No urine change.  Sugars as outlined.       Objective:   Physical Exam  Filed Vitals:   05/23/13 1146  BP: 120/70  Pulse: 71  Temp: 98.4 F (36.9 C)   Blood pressure recheck:  122/78, pulse 87  62 year old female in no acute distress.   HEENT:  Nares- clear.  Oropharynx - without lesions. NECK:  Supple.  Nontender.  No audible bruit.  HEART:  Appears to be regular. LUNGS:  No crackles  or wheezing audible.  Respirations even and unlabored.  RADIAL PULSE:  Equal bilaterally.    BREASTS:  No nipple discharge or nipple retraction present.  Could not appreciate any distinct nodules or axillary adenopathy.  ABDOMEN:  Soft, nontender.  Bowel sounds present and normal.  No audible abdominal bruit.  GU:  Not performed.     EXTREMITIES:  No increased edema present.  DP pulses palpable and equal bilaterally.  SKIN:  Feet without lesions.           Assessment & Plan:  CARDIOVASCULAR.  Stable.  Currently asymptomatic. Continue risk factor modification.   PULMONARY.  Breathing stable.  Follow.    HEALTH MAINTENANCE.  Physical today.  She is s/p hysterectomy.  Just had colonoscopy.  See report.  Information given for her to schedule her mammogram.

## 2013-05-24 NOTE — Assessment & Plan Note (Signed)
Stable.  Follow.   

## 2013-06-01 ENCOUNTER — Other Ambulatory Visit (INDEPENDENT_AMBULATORY_CARE_PROVIDER_SITE_OTHER): Payer: Medicare Other

## 2013-06-01 DIAGNOSIS — R945 Abnormal results of liver function studies: Secondary | ICD-10-CM

## 2013-06-01 DIAGNOSIS — K769 Liver disease, unspecified: Secondary | ICD-10-CM

## 2013-06-01 DIAGNOSIS — E7800 Pure hypercholesterolemia, unspecified: Secondary | ICD-10-CM

## 2013-06-01 DIAGNOSIS — E119 Type 2 diabetes mellitus without complications: Secondary | ICD-10-CM

## 2013-06-01 LAB — MICROALBUMIN / CREATININE URINE RATIO: Microalb, Ur: 1.2 mg/dL (ref 0.0–1.9)

## 2013-06-01 LAB — HEPATIC FUNCTION PANEL
ALT: 19 U/L (ref 0–35)
AST: 22 U/L (ref 0–37)
Albumin: 3.5 g/dL (ref 3.5–5.2)
Alkaline Phosphatase: 57 U/L (ref 39–117)
Bilirubin, Direct: 0.1 mg/dL (ref 0.0–0.3)
Total Protein: 6.1 g/dL (ref 6.0–8.3)

## 2013-06-01 LAB — BASIC METABOLIC PANEL
BUN: 19 mg/dL (ref 6–23)
Calcium: 8.7 mg/dL (ref 8.4–10.5)
Creatinine, Ser: 1.1 mg/dL (ref 0.4–1.2)
GFR: 56.38 mL/min — ABNORMAL LOW (ref >60.00)
Glucose, Bld: 109 mg/dL — ABNORMAL HIGH (ref 70–99)

## 2013-06-01 LAB — LIPID PANEL
Cholesterol: 210 mg/dL — ABNORMAL HIGH (ref 0–200)
VLDL: 24 mg/dL (ref 0.0–40.0)

## 2013-06-02 ENCOUNTER — Encounter: Payer: Self-pay | Admitting: Internal Medicine

## 2013-06-02 LAB — HM DIABETES FOOT EXAM

## 2013-06-07 ENCOUNTER — Encounter: Payer: Self-pay | Admitting: *Deleted

## 2013-06-09 ENCOUNTER — Encounter: Payer: Self-pay | Admitting: *Deleted

## 2013-06-09 ENCOUNTER — Encounter: Payer: Self-pay | Admitting: Internal Medicine

## 2013-06-12 ENCOUNTER — Encounter: Payer: Self-pay | Admitting: Internal Medicine

## 2013-06-12 ENCOUNTER — Ambulatory Visit: Payer: Self-pay | Admitting: Ophthalmology

## 2013-06-12 ENCOUNTER — Ambulatory Visit: Payer: Self-pay | Admitting: Internal Medicine

## 2013-06-12 LAB — POTASSIUM: Potassium: 4.1 mmol/L (ref 3.5–5.1)

## 2013-06-19 ENCOUNTER — Ambulatory Visit: Payer: Self-pay | Admitting: Ophthalmology

## 2013-09-26 ENCOUNTER — Ambulatory Visit: Payer: Medicare Other | Admitting: Internal Medicine

## 2013-12-04 ENCOUNTER — Encounter: Payer: Self-pay | Admitting: Adult Health

## 2013-12-04 ENCOUNTER — Ambulatory Visit (INDEPENDENT_AMBULATORY_CARE_PROVIDER_SITE_OTHER): Payer: Medicare HMO | Admitting: Adult Health

## 2013-12-04 VITALS — BP 110/60 | HR 83 | Temp 99.2°F | Resp 12 | Wt 163.5 lb

## 2013-12-04 DIAGNOSIS — J209 Acute bronchitis, unspecified: Secondary | ICD-10-CM

## 2013-12-04 DIAGNOSIS — R509 Fever, unspecified: Secondary | ICD-10-CM

## 2013-12-04 LAB — POCT RAPID STREP A (OFFICE): RAPID STREP A SCREEN: NEGATIVE

## 2013-12-04 LAB — POCT INFLUENZA A/B
Influenza A, POC: NEGATIVE
Influenza B, POC: NEGATIVE

## 2013-12-04 MED ORDER — AMOXICILLIN-POT CLAVULANATE 875-125 MG PO TABS: 1.0000 | ORAL_TABLET | Freq: Two times a day (BID) | ORAL | Status: DC

## 2013-12-04 MED ORDER — GUAIFENESIN-CODEINE 100-10 MG/5ML PO SOLN: 5.0000 mL | Freq: Three times a day (TID) | ORAL | Status: DC | PRN

## 2013-12-04 NOTE — Patient Instructions (Signed)
  Augmentin 1 tablet twice a day for 10 days.   You may continue with Mucinex for your symptoms.  Robitussin a.c. with codeine for cough at bedtime. This medication may cause sedation. Do not drive while taking this medication.  Continue fluids to maintain hydration.

## 2013-12-04 NOTE — Progress Notes (Signed)
Subjective:    Patient ID: Ashley Gross, female    DOB: May 12, 1951, 63 y.o.   MRN: [id number]  HPI  Patient is a 63 year old female who presents to clinic with cough, general body aches, low-grade fever, some sinus congestion, sore throat, post nasal drip. She reports taking care of her granddaughter who recently had the flu and also her grandson with strep. She has been taking over the counter medication for her symptoms. Mucinex during the day and NyQuil at bedtime. Cough is keeping her up. She will get ~ 3 hours of sleep with the NyQuil but then she is up coughing.   Current Outpatient Prescriptions on File Prior to Visit  Medication Sig Dispense Refill  . albuterol (PROVENTIL HFA;VENTOLIN HFA) 108 (90 BASE) MCG/ACT inhaler Inhale 2 puffs into the lungs every 6 (six) hours as needed.      Marland Kitchen amLODipine (NORVASC) 5 MG tablet Take 1 tablet (5 mg total) by mouth 2 (two) times daily.  180 tablet  3  . Calcium Carbonate-Vitamin D (CALTRATE 600+D) 600-400 MG-UNIT per tablet Take 1 tablet by mouth 2 (two) times daily.      . Cholecalciferol (VITAMIN D-3) 1000 UNITS CAPS Take 1 capsule by mouth daily.      . citalopram (CELEXA) 10 MG tablet Take 1 tablet (10 mg total) by mouth daily.  90 tablet  3  . glipiZIDE (GLIPIZIDE XL) 5 MG 24 hr tablet Take 1 tablet (5 mg total) by mouth daily.  90 tablet  3  . glucose blood test strip accu-chek compact test strip Check blood sugars daily  100 each  12  . lisinopril (PRINIVIL,ZESTRIL) 40 MG tablet Take 1 tablet (40 mg total) by mouth daily.  90 tablet  3  . metFORMIN (GLUCOPHAGE) 1000 MG tablet Take 1 tablet (1,000 mg total) by mouth 2 (two) times daily with a meal.  180 tablet  3  . omeprazole (PRILOSEC) 20 MG capsule Take 1 capsule (20 mg total) by mouth daily.  90 capsule  3  . pioglitazone (ACTOS) 15 MG tablet Take 1 tablet (15 mg total) by mouth daily.  90 tablet  3  . triamterene-hydrochlorothiazide (MAXZIDE-25) 37.5-25 MG per tablet Take 1 each (1  tablet total) by mouth daily.  90 tablet  3  . ipratropium (ATROVENT HFA) 17 MCG/ACT inhaler Inhale 2 puffs into the lungs every 6 (six) hours.       No current facility-administered medications on file prior to visit.    Review of Systems  Constitutional: Positive for fever and chills.  HENT: Positive for congestion, postnasal drip and sore throat.   Respiratory: Positive for cough.   Cardiovascular: Negative.   Gastrointestinal: Negative.   Neurological: Negative.   Psychiatric/Behavioral: Negative.   All other systems reviewed and are negative.       Objective:   Physical Exam  Constitutional: She is oriented to person, place, and time.  63 year old female appears acutely ill  HENT:  Head: Normocephalic and atraumatic.  Right Ear: External ear normal.  Left Ear: External ear normal.  Pharyngeal erythema.  Cardiovascular: Normal rate, regular rhythm and normal heart sounds.  Exam reveals no gallop and no friction rub.   No murmur heard. Pulmonary/Chest: Breath sounds normal. No respiratory distress. She has no wheezes. She has no rales.  Neurological: She is alert and oriented to person, place, and time.  Skin: Skin is warm and dry.  Psychiatric: She has a normal mood and affect. Her behavior is normal. Judgment  and thought content normal.    BP 110/60  Pulse 83  Temp(Src) 99.2 F (37.3 C) (Oral)  Resp 12  Wt 163 lb 8 oz (74.163 kg)  SpO2 98%       Assessment & Plan:

## 2013-12-04 NOTE — Assessment & Plan Note (Addendum)
Negative for strep and influenza. Start Augmentin 1 tablet twice a day x10 days. Increase fluids. Robitussin a.c. for severe cough at bedtime. May continue Mucinex during the day for symptom control. RTC if symptoms are not improved within 4-5 days or sooner if necessary.

## 2013-12-04 NOTE — Progress Notes (Signed)
Pre visit review using our clinic review tool, if applicable. No additional management support is needed unless otherwise documented below in the visit note. 

## 2013-12-05 ENCOUNTER — Encounter: Payer: Self-pay | Admitting: Internal Medicine

## 2013-12-05 ENCOUNTER — Other Ambulatory Visit: Payer: Self-pay | Admitting: *Deleted

## 2013-12-05 MED ORDER — CITALOPRAM HYDROBROMIDE 10 MG PO TABS: 10.0000 mg | ORAL_TABLET | Freq: Every day | ORAL | Status: DC

## 2013-12-05 MED ORDER — TRIAMTERENE-HCTZ 37.5-25 MG PO TABS: 1.0000 | ORAL_TABLET | Freq: Every day | ORAL | Status: DC

## 2013-12-05 MED ORDER — OMEPRAZOLE 20 MG PO CPDR: 20.0000 mg | DELAYED_RELEASE_CAPSULE | Freq: Every day | ORAL | Status: DC

## 2013-12-05 MED ORDER — LISINOPRIL 40 MG PO TABS: 40.0000 mg | ORAL_TABLET | Freq: Every day | ORAL | Status: DC

## 2013-12-07 ENCOUNTER — Other Ambulatory Visit: Payer: Self-pay | Admitting: *Deleted

## 2013-12-07 ENCOUNTER — Encounter: Payer: Self-pay | Admitting: Internal Medicine

## 2013-12-07 MED ORDER — PIOGLITAZONE HCL 15 MG PO TABS: 15.0000 mg | ORAL_TABLET | Freq: Every day | ORAL | Status: DC

## 2013-12-07 MED ORDER — GLIPIZIDE ER 5 MG PO TB24: 5.0000 mg | ORAL_TABLET | Freq: Every day | ORAL | Status: DC

## 2013-12-07 MED ORDER — AMLODIPINE BESYLATE 5 MG PO TABS: 5.0000 mg | ORAL_TABLET | Freq: Two times a day (BID) | ORAL | Status: DC

## 2014-01-02 ENCOUNTER — Encounter: Payer: Self-pay | Admitting: Internal Medicine

## 2014-01-02 ENCOUNTER — Ambulatory Visit (INDEPENDENT_AMBULATORY_CARE_PROVIDER_SITE_OTHER)
Admission: RE | Admit: 2014-01-02 | Discharge: 2014-01-02 | Disposition: A | Discharge status: Home / Self Care | Payer: Commercial Managed Care - HMO | Source: Ambulatory Visit | Attending: Internal Medicine | Admitting: Internal Medicine

## 2014-01-02 ENCOUNTER — Ambulatory Visit (INDEPENDENT_AMBULATORY_CARE_PROVIDER_SITE_OTHER): Payer: Commercial Managed Care - HMO | Admitting: Internal Medicine

## 2014-01-02 VITALS — BP 126/70 | HR 91 | Temp 98.6°F | Resp 16 | Ht 64.0 in | Wt 168.5 lb

## 2014-01-02 DIAGNOSIS — J209 Acute bronchitis, unspecified: Secondary | ICD-10-CM

## 2014-01-02 DIAGNOSIS — R7989 Other specified abnormal findings of blood chemistry: Secondary | ICD-10-CM

## 2014-01-02 DIAGNOSIS — E119 Type 2 diabetes mellitus without complications: Secondary | ICD-10-CM

## 2014-01-02 DIAGNOSIS — R05 Cough: Secondary | ICD-10-CM

## 2014-01-02 DIAGNOSIS — R059 Cough, unspecified: Secondary | ICD-10-CM

## 2014-01-02 DIAGNOSIS — R0781 Pleurodynia: Secondary | ICD-10-CM

## 2014-01-02 DIAGNOSIS — R079 Chest pain, unspecified: Secondary | ICD-10-CM

## 2014-01-02 DIAGNOSIS — D649 Anemia, unspecified: Secondary | ICD-10-CM

## 2014-01-02 DIAGNOSIS — R945 Abnormal results of liver function studies: Secondary | ICD-10-CM

## 2014-01-02 DIAGNOSIS — E7800 Pure hypercholesterolemia, unspecified: Secondary | ICD-10-CM

## 2014-01-02 DIAGNOSIS — I10 Essential (primary) hypertension: Secondary | ICD-10-CM

## 2014-01-02 MED ORDER — PREDNISONE 10 MG PO TABS: ORAL_TABLET | ORAL | Status: DC

## 2014-01-02 MED ORDER — AMOXICILLIN-POT CLAVULANATE 875-125 MG PO TABS: 1.0000 | ORAL_TABLET | Freq: Two times a day (BID) | ORAL | Status: DC

## 2014-01-02 NOTE — Progress Notes (Signed)
Pre visit review using our clinic review tool, if applicable. No additional management support is needed unless otherwise documented below in the visit note. 

## 2014-01-06 ENCOUNTER — Encounter: Payer: Self-pay | Admitting: Internal Medicine

## 2014-01-06 DIAGNOSIS — R6889 Other general symptoms and signs: Secondary | ICD-10-CM | POA: Insufficient documentation

## 2014-01-06 NOTE — Progress Notes (Signed)
Subjective:    Patient ID: Ashley Gross, female    DOB: 1951/08/18, 63 y.o.   MRN: [id number]  Cough  62 year old female with past history of hypertension, diabetes and psoriatic arthritis who comes in today as a work in with concerns regarding persistent cough and congestion.  Saw Raquel a few weeks ago.  Was treated with augmentin.  Symptoms better.  The last week, she has noticed worsening symptoms.  Increased congestion and cough.  Hurts her left side when she coughs.  She is wheezing.  Some chest tightness  Has used the nebulizer machine 3-4x/two weeks.  Increased nasal congestion.  Productive cough - yellow mucus.  Some decreased appetite.  No vomiting.       Past Medical History  Diagnosis Date  . Pure hypercholesterolemia   . Pernicious anemia   . Psoriatic arthritis     s/p penicillamine, plaquenil, MTX, sulfasalazine.  s/p Embrel, Humira,.  Iritis.    . Hypertension   . Anemia, unspecified   . IBS (irritable bowel syndrome)   . Nephrolithiasis   . H/O hiatal hernia   . Colitis   . Hx of arteriovenous malformation (AVM)   . Diabetes mellitus   . Esophagitis      Current Outpatient Prescriptions on File Prior to Visit  Medication Sig Dispense Refill  . albuterol (ACCUNEB) 0.63 MG/3ML nebulizer solution Take 1 ampule by nebulization every 6 (six) hours as needed for wheezing.      Marland Kitchen albuterol (PROVENTIL HFA;VENTOLIN HFA) 108 (90 BASE) MCG/ACT inhaler Inhale 2 puffs into the lungs every 6 (six) hours as needed.      Marland Kitchen amLODipine (NORVASC) 5 MG tablet Take 1 tablet (5 mg total) by mouth 2 (two) times daily.  180 tablet  1  . Calcium Carbonate-Vitamin D (CALTRATE 600+D) 600-400 MG-UNIT per tablet Take 1 tablet by mouth 2 (two) times daily.      . Cholecalciferol (VITAMIN D-3) 1000 UNITS CAPS Take 1 capsule by mouth daily.      . citalopram (CELEXA) 10 MG tablet Take 1 tablet (10 mg total) by mouth daily.  90 tablet  0  . glipiZIDE (GLIPIZIDE XL) 5 MG 24 hr tablet Take 1 tablet  (5 mg total) by mouth daily.  90 tablet  1  . glucose blood test strip accu-chek compact test strip Check blood sugars daily  100 each  12  . ipratropium (ATROVENT HFA) 17 MCG/ACT inhaler Inhale 2 puffs into the lungs every 6 (six) hours.      Marland Kitchen lisinopril (PRINIVIL,ZESTRIL) 40 MG tablet Take 1 tablet (40 mg total) by mouth daily.  90 tablet  1  . metFORMIN (GLUCOPHAGE) 1000 MG tablet Take 1 tablet (1,000 mg total) by mouth 2 (two) times daily with a meal.  180 tablet  3  . omeprazole (PRILOSEC) 20 MG capsule Take 1 capsule (20 mg total) by mouth daily.  90 capsule  3  . Pancrelipase, Lip-Prot-Amyl, 24000 UNITS CPEP Take 1 capsule by mouth 4 (four) times daily.      . pioglitazone (ACTOS) 15 MG tablet Take 1 tablet (15 mg total) by mouth daily.  90 tablet  1  . triamterene-hydrochlorothiazide (MAXZIDE-25) 37.5-25 MG per tablet Take 1 tablet by mouth daily.  90 tablet  1   No current facility-administered medications on file prior to visit.    Review of Systems  Respiratory: Positive for cough.   Patient denies any headache, lightheadedness or dizziness.  Some nasal congestion.  No chest  pain, tightness or palpitations.  Increased cough and congestion.  Increased wheezing.  Cough productive - yellow mucus.   No nausea or vomiting.  Increased flares with her bowels.  Increased loose stool.  Multiple episodes of loose stool per day.  Taking immodium.   No BRBPR or melana.  No urine change.  Sugars doing well.  Highest reading - 140s.       Objective:   Physical Exam  Filed Vitals:   01/02/14 1113  BP: 126/70  Pulse: 91  Temp: 98.6 F (37 C)  Resp: 59   63 year old female in no acute distress.   HEENT:  Nares- slightly erythematous turbinates.  Oropharynx - without lesions.  No significant tenderness to palpation over the sinus.   NECK:  Supple.  Nontender.   HEART:  Appears to be regular. LUNGS:  No crackles.  Some increased cough with forced expiration.  Some increased congestion -  cleared with coughing.   RADIAL PULSE:  Equal bilaterally. ABDOMEN:  Soft, nontender.  Bowel sounds present and normal.  No audible abdominal bruit.            Assessment & Plan:  CARDIOVASCULAR.  Stable.  Currently asymptomatic. Continue risk factor modification.   HEALTH MAINTENANCE.  Physical 05/23/13.  She is s/p hysterectomy.  Just had colonoscopy.  See report.  Mammogram 06/12/13 - Birads I.

## 2014-01-06 NOTE — Assessment & Plan Note (Signed)
Blood pressure under good control.  Same medication.  Follow metabolic panel.   

## 2014-01-06 NOTE — Assessment & Plan Note (Signed)
Treat with augmentin as directed.  Prednisone as directed.  Continue inhalers as directed.  Mucinex DM in the am and robitussin DM in the evening.  Follow.  Check cxr.

## 2014-01-06 NOTE — Assessment & Plan Note (Signed)
Persistent rib pain.  Check rib xray.

## 2014-01-17 ENCOUNTER — Telehealth: Payer: Self-pay | Admitting: *Deleted

## 2014-01-17 ENCOUNTER — Other Ambulatory Visit (INDEPENDENT_AMBULATORY_CARE_PROVIDER_SITE_OTHER): Payer: Commercial Managed Care - HMO

## 2014-01-17 DIAGNOSIS — R945 Abnormal results of liver function studies: Secondary | ICD-10-CM

## 2014-01-17 DIAGNOSIS — E119 Type 2 diabetes mellitus without complications: Secondary | ICD-10-CM

## 2014-01-17 DIAGNOSIS — I10 Essential (primary) hypertension: Secondary | ICD-10-CM

## 2014-01-17 DIAGNOSIS — E7800 Pure hypercholesterolemia, unspecified: Secondary | ICD-10-CM

## 2014-01-17 DIAGNOSIS — R7989 Other specified abnormal findings of blood chemistry: Secondary | ICD-10-CM

## 2014-01-17 LAB — LIPID PANEL
CHOLESTEROL: 263 mg/dL — AB (ref 0–200)
HDL: 73.7 mg/dL (ref >39.00)
LDL CALC: 128 mg/dL — AB (ref 0–99)
TRIGLYCERIDES: 309 mg/dL — AB (ref 0.0–149.0)
Total CHOL/HDL Ratio: 4
VLDL: 61.8 mg/dL — ABNORMAL HIGH (ref 0.0–40.0)

## 2014-01-17 LAB — HEPATIC FUNCTION PANEL
ALT: 22 U/L (ref 0–35)
AST: 21 U/L (ref 0–37)
Albumin: 3.6 g/dL (ref 3.5–5.2)
Alkaline Phosphatase: 105 U/L (ref 39–117)
Bilirubin, Direct: 0 mg/dL (ref 0.0–0.3)
TOTAL PROTEIN: 6.4 g/dL (ref 6.0–8.3)
Total Bilirubin: 0.7 mg/dL (ref 0.3–1.2)

## 2014-01-17 LAB — BASIC METABOLIC PANEL
BUN: 29 mg/dL — ABNORMAL HIGH (ref 6–23)
CHLORIDE: 103 meq/L (ref 96–112)
CO2: 24 mEq/L (ref 19–32)
Calcium: 8.8 mg/dL (ref 8.4–10.5)
Creatinine, Ser: 1.2 mg/dL (ref 0.4–1.2)
GFR: 48.23 mL/min — ABNORMAL LOW (ref >60.00)
Glucose, Bld: 176 mg/dL — ABNORMAL HIGH (ref 70–99)
POTASSIUM: 4.3 meq/L (ref 3.5–5.1)
Sodium: 136 mEq/L (ref 135–145)

## 2014-01-17 LAB — TSH: TSH: 0.99 u[IU]/mL (ref 0.35–5.50)

## 2014-01-17 NOTE — Telephone Encounter (Signed)
Realized after pt left that i didn't get a purple top, I have tired calling pt but it goes straight to v-mail, everything will be resulted but the CBC and A1C, sorry about that

## 2014-01-17 NOTE — Telephone Encounter (Signed)
Noted.  Please try her again or leave her a message to return.  Thanks.    Dr Nicki Reaper

## 2014-01-22 ENCOUNTER — Other Ambulatory Visit: Payer: Self-pay | Admitting: *Deleted

## 2014-01-30 ENCOUNTER — Encounter: Payer: Self-pay | Admitting: Internal Medicine

## 2014-02-01 NOTE — Telephone Encounter (Signed)
Mailed unread message to pt  

## 2014-02-05 ENCOUNTER — Other Ambulatory Visit: Payer: Self-pay | Admitting: *Deleted

## 2014-02-05 MED ORDER — CITALOPRAM HYDROBROMIDE 10 MG PO TABS: 10.0000 mg | ORAL_TABLET | Freq: Every day | ORAL | Status: DC

## 2014-02-06 ENCOUNTER — Other Ambulatory Visit (INDEPENDENT_AMBULATORY_CARE_PROVIDER_SITE_OTHER): Payer: Commercial Managed Care - HMO

## 2014-02-06 DIAGNOSIS — D649 Anemia, unspecified: Secondary | ICD-10-CM

## 2014-02-06 DIAGNOSIS — E119 Type 2 diabetes mellitus without complications: Secondary | ICD-10-CM

## 2014-02-06 LAB — CBC WITH DIFFERENTIAL/PLATELET
BASOS ABS: 0 10*3/uL (ref 0.0–0.1)
Basophils Relative: 0.8 % (ref 0.0–3.0)
EOS ABS: 0.1 10*3/uL (ref 0.0–0.7)
Eosinophils Relative: 2.1 % (ref 0.0–5.0)
HCT: 35 % — ABNORMAL LOW (ref 36.0–46.0)
Hemoglobin: 11.6 g/dL — ABNORMAL LOW (ref 12.0–15.0)
Lymphocytes Relative: 33.6 % (ref 12.0–46.0)
Lymphs Abs: 1.9 10*3/uL (ref 0.7–4.0)
MCHC: 33.2 g/dL (ref 30.0–36.0)
MCV: 88.5 fl (ref 78.0–100.0)
MONO ABS: 0.5 10*3/uL (ref 0.1–1.0)
Monocytes Relative: 8.9 % (ref 3.0–12.0)
NEUTROS PCT: 54.6 % (ref 43.0–77.0)
Neutro Abs: 3.1 10*3/uL (ref 1.4–7.7)
PLATELETS: 300 10*3/uL (ref 150.0–400.0)
RBC: 3.96 Mil/uL (ref 3.87–5.11)
RDW: 14.7 % — AB (ref 11.5–14.6)
WBC: 5.7 10*3/uL (ref 4.5–10.5)

## 2014-02-06 LAB — HEMOGLOBIN A1C: HEMOGLOBIN A1C: 7 % — AB (ref 4.6–6.5)

## 2014-02-11 ENCOUNTER — Encounter: Payer: Self-pay | Admitting: Internal Medicine

## 2014-02-15 ENCOUNTER — Encounter: Payer: Self-pay | Admitting: Internal Medicine

## 2014-02-15 ENCOUNTER — Ambulatory Visit (INDEPENDENT_AMBULATORY_CARE_PROVIDER_SITE_OTHER): Payer: Commercial Managed Care - HMO | Admitting: Internal Medicine

## 2014-02-15 VITALS — BP 130/78 | HR 103 | Temp 98.3°F | Ht 64.0 in | Wt 170.8 lb

## 2014-02-15 DIAGNOSIS — K589 Irritable bowel syndrome without diarrhea: Secondary | ICD-10-CM

## 2014-02-15 DIAGNOSIS — D649 Anemia, unspecified: Secondary | ICD-10-CM

## 2014-02-15 DIAGNOSIS — K219 Gastro-esophageal reflux disease without esophagitis: Secondary | ICD-10-CM

## 2014-02-15 DIAGNOSIS — R7989 Other specified abnormal findings of blood chemistry: Secondary | ICD-10-CM

## 2014-02-15 DIAGNOSIS — K5520 Angiodysplasia of colon without hemorrhage: Secondary | ICD-10-CM

## 2014-02-15 DIAGNOSIS — J209 Acute bronchitis, unspecified: Secondary | ICD-10-CM

## 2014-02-15 DIAGNOSIS — Q2733 Arteriovenous malformation of digestive system vessel: Secondary | ICD-10-CM

## 2014-02-15 DIAGNOSIS — M949 Disorder of cartilage, unspecified: Secondary | ICD-10-CM

## 2014-02-15 DIAGNOSIS — E7800 Pure hypercholesterolemia, unspecified: Secondary | ICD-10-CM

## 2014-02-15 DIAGNOSIS — M899 Disorder of bone, unspecified: Secondary | ICD-10-CM

## 2014-02-15 DIAGNOSIS — E119 Type 2 diabetes mellitus without complications: Secondary | ICD-10-CM

## 2014-02-15 DIAGNOSIS — M8580 Other specified disorders of bone density and structure, unspecified site: Secondary | ICD-10-CM

## 2014-02-15 DIAGNOSIS — I10 Essential (primary) hypertension: Secondary | ICD-10-CM

## 2014-02-15 DIAGNOSIS — R945 Abnormal results of liver function studies: Secondary | ICD-10-CM

## 2014-02-15 DIAGNOSIS — Z86010 Personal history of colonic polyps: Secondary | ICD-10-CM

## 2014-02-18 ENCOUNTER — Encounter: Payer: Self-pay | Admitting: Internal Medicine

## 2014-02-18 NOTE — Assessment & Plan Note (Signed)
Last bone density revealed osteopenia with no significant change form previous.  Continue calcium and vitamin D.   

## 2014-02-18 NOTE — Assessment & Plan Note (Signed)
Low cholesterol diet and exercise.  Follow lipid panel.  Triglycerides just checked and elevated.  States she ate a lot of ice cream when sick.  Adjusting her diet now.  Follow.

## 2014-02-18 NOTE — Assessment & Plan Note (Signed)
No significant problems with lows now.  Follow sugars.  Eat regular meals.  Check metabolic panel and N8M.  We discussed adjusting medication.  She wants to work on diet and exercise.  Feels she can get it down.  Follow.

## 2014-02-18 NOTE — Progress Notes (Signed)
Subjective:    Patient ID: Ashley Gross, female    DOB: September 04, 1951, 63 y.o.   MRN: [id number]  HPI 63 year old female with past history of hypertension, diabetes and psoriatic arthritis who comes in today for a scheduled follow up.  She saw Dr Maurene Capes for an abnormal ultrasound finding.  Diagnosed with fatty liver.  No obstruction found.  Off Humira.  She is trying to watch what she eats.  Sugars are doing better now.  Over her respiratory infection.  Brought in no recorded sugar readings.  States averaging 118-140 in the am and 130 in the pm.  Breathing stable.  Eating and drinking ok.  She is still having the bowel issues.  Seeing Dr Redmond Pulling.  She just recently performed a colonoscopy.  Increased stress with her husband's medical issues.  Feels she is handling things relatively well.      Past Medical History  Diagnosis Date  . Pure hypercholesterolemia   . Pernicious anemia   . Psoriatic arthritis     s/p penicillamine, plaquenil, MTX, sulfasalazine.  s/p Embrel, Humira,.  Iritis.    . Hypertension   . Anemia, unspecified   . IBS (irritable bowel syndrome)   . Nephrolithiasis   . H/O hiatal hernia   . Colitis   . Hx of arteriovenous malformation (AVM)   . Diabetes mellitus   . Esophagitis      Current Outpatient Prescriptions on File Prior to Visit  Medication Sig Dispense Refill  . albuterol (ACCUNEB) 0.63 MG/3ML nebulizer solution Take 1 ampule by nebulization every 6 (six) hours as needed for wheezing.      Marland Kitchen albuterol (PROVENTIL HFA;VENTOLIN HFA) 108 (90 BASE) MCG/ACT inhaler Inhale 2 puffs into the lungs every 6 (six) hours as needed.      Marland Kitchen amLODipine (NORVASC) 5 MG tablet Take 1 tablet (5 mg total) by mouth 2 (two) times daily.  180 tablet  1  . Calcium Carbonate-Vitamin D (CALTRATE 600+D) 600-400 MG-UNIT per tablet Take 1 tablet by mouth 2 (two) times daily.      . Cholecalciferol (VITAMIN D-3) 1000 UNITS CAPS Take 1 capsule by mouth daily.      . citalopram (CELEXA) 10 MG  tablet Take 1 tablet (10 mg total) by mouth daily.  90 tablet  0  . glipiZIDE (GLIPIZIDE XL) 5 MG 24 hr tablet Take 1 tablet (5 mg total) by mouth daily.  90 tablet  1  . glucose blood test strip accu-chek compact test strip Check blood sugars daily  100 each  12  . ipratropium (ATROVENT HFA) 17 MCG/ACT inhaler Inhale 2 puffs into the lungs every 6 (six) hours.      Marland Kitchen lisinopril (PRINIVIL,ZESTRIL) 40 MG tablet Take 1 tablet (40 mg total) by mouth daily.  90 tablet  1  . metFORMIN (GLUCOPHAGE) 1000 MG tablet Take 1 tablet (1,000 mg total) by mouth 2 (two) times daily with a meal.  180 tablet  3  . omeprazole (PRILOSEC) 20 MG capsule Take 1 capsule (20 mg total) by mouth daily.  90 capsule  3  . Pancrelipase, Lip-Prot-Amyl, 24000 UNITS CPEP Take 1 capsule by mouth 4 (four) times daily.      . pioglitazone (ACTOS) 15 MG tablet Take 1 tablet (15 mg total) by mouth daily.  90 tablet  1  . triamterene-hydrochlorothiazide (MAXZIDE-25) 37.5-25 MG per tablet Take 1 tablet by mouth daily.  90 tablet  1   No current facility-administered medications on file prior to visit.  Review of Systems Patient denies any headache, lightheadedness or dizziness. No sinus or allergy symptoms.   No chest pain, tightness or palpitations.  No increased shortness of breath, cough or congestion.  No nausea or vomiting.  Increased flares with her bowels.  Increased loose stool.  Multiple episodes of loose stool per day.  Taking immodium.   No BRBPR or melana.  No urine change.  Sugars as outlined.       Objective:   Physical Exam  Filed Vitals:   02/15/14 1400  BP: 130/78  Pulse: 103  Temp: 98.3 F (36.8 C)   pulse 44  63 year old female in no acute distress.   HEENT:  Nares- clear.  Oropharynx - without lesions. NECK:  Supple.  Nontender.  No audible bruit.  HEART:  Appears to be regular. LUNGS:  No crackles or wheezing audible.  Respirations even and unlabored.  RADIAL PULSE:  Equal bilaterally.   ABDOMEN:   Soft, nontender.  Bowel sounds present and normal.  No audible abdominal bruit.     EXTREMITIES:  No increased edema present.  DP pulses palpable and equal bilaterally.  SKIN:  Feet without lesions.           Assessment & Plan:  CARDIOVASCULAR.  Stable.  Currently asymptomatic. Continue risk factor modification.   PULMONARY.  Breathing stable.  Follow.    HEALTH MAINTENANCE.  Physical 05/23/13.  She is s/p hysterectomy.  Just had colonoscopy.  See report.  Mammogram 06/12/13 - Birads I.    I spent 25 minutes with the patient and more than 50% of the time was spent in consultation regarding the above.

## 2014-02-18 NOTE — Assessment & Plan Note (Signed)
Reflux controlled.  On omeprazole.  Follow.   

## 2014-02-18 NOTE — Assessment & Plan Note (Signed)
Follow cbc.  

## 2014-02-18 NOTE — Assessment & Plan Note (Signed)
Stable.  Follow.   

## 2014-02-18 NOTE — Assessment & Plan Note (Signed)
Had abdominal ultrasound previously.  Discussed with Dr Wilson.  Follow liver panel.  Diagnosed with fatty liver.    

## 2014-02-18 NOTE — Assessment & Plan Note (Signed)
Colonoscopy as outlined.  Continue to follow up with Dr Wilson.   

## 2014-02-18 NOTE — Assessment & Plan Note (Signed)
Resolved

## 2014-02-18 NOTE — Assessment & Plan Note (Signed)
Blood pressure under good control.  Same medication.  Follow metabolic panel.   

## 2014-02-18 NOTE — Assessment & Plan Note (Signed)
Increased loose stool as outlined.  Just had colonoscopy as outlined.   Flares intermittently.  Continue to f/u with Dr Wilson.   

## 2014-03-14 ENCOUNTER — Telehealth: Payer: Self-pay | Admitting: Internal Medicine

## 2014-03-14 MED ORDER — METFORMIN HCL 1000 MG PO TABS: 1000.0000 mg | ORAL_TABLET | Freq: Two times a day (BID) | ORAL | Status: DC

## 2014-03-14 MED ORDER — PIOGLITAZONE HCL 15 MG PO TABS: 15.0000 mg | ORAL_TABLET | Freq: Every day | ORAL | Status: DC

## 2014-03-14 MED ORDER — TRIAMTERENE-HCTZ 37.5-25 MG PO TABS: 1.0000 | ORAL_TABLET | Freq: Every day | ORAL | Status: DC

## 2014-03-14 MED ORDER — AMLODIPINE BESYLATE 5 MG PO TABS: 5.0000 mg | ORAL_TABLET | Freq: Two times a day (BID) | ORAL | Status: DC

## 2014-03-14 MED ORDER — GLIPIZIDE ER 5 MG PO TB24: 5.0000 mg | ORAL_TABLET | Freq: Every day | ORAL | Status: DC

## 2014-03-14 MED ORDER — LISINOPRIL 40 MG PO TABS: 40.0000 mg | ORAL_TABLET | Freq: Every day | ORAL | Status: DC

## 2014-03-14 NOTE — Telephone Encounter (Signed)
Refilled amlodipine, glipizide, lisinopril, metformin, actos and triam/hctz.

## 2014-04-20 ENCOUNTER — Ambulatory Visit: Payer: Commercial Managed Care - HMO | Admitting: Internal Medicine

## 2014-05-25 ENCOUNTER — Telehealth: Payer: Self-pay | Admitting: *Deleted

## 2014-05-28 ENCOUNTER — Other Ambulatory Visit (INDEPENDENT_AMBULATORY_CARE_PROVIDER_SITE_OTHER): Payer: Commercial Managed Care - HMO

## 2014-05-28 DIAGNOSIS — R7989 Other specified abnormal findings of blood chemistry: Secondary | ICD-10-CM

## 2014-05-28 DIAGNOSIS — I10 Essential (primary) hypertension: Secondary | ICD-10-CM

## 2014-05-28 DIAGNOSIS — D649 Anemia, unspecified: Secondary | ICD-10-CM

## 2014-05-28 DIAGNOSIS — E7800 Pure hypercholesterolemia, unspecified: Secondary | ICD-10-CM

## 2014-05-28 DIAGNOSIS — R945 Abnormal results of liver function studies: Secondary | ICD-10-CM

## 2014-05-28 DIAGNOSIS — E119 Type 2 diabetes mellitus without complications: Secondary | ICD-10-CM

## 2014-05-28 LAB — FERRITIN: Ferritin: 20.6 ng/mL (ref 10.0–291.0)

## 2014-05-28 LAB — BASIC METABOLIC PANEL
BUN: 15 mg/dL (ref 6–23)
CO2: 26 mEq/L (ref 19–32)
Calcium: 9 mg/dL (ref 8.4–10.5)
Chloride: 102 mEq/L (ref 96–112)
Creatinine, Ser: 1.1 mg/dL (ref 0.4–1.2)
GFR: 53.83 mL/min — AB (ref >60.00)
Glucose, Bld: 98 mg/dL (ref 70–99)
Potassium: 4.2 mEq/L (ref 3.5–5.1)
SODIUM: 137 meq/L (ref 135–145)

## 2014-05-28 LAB — CBC WITH DIFFERENTIAL/PLATELET
BASOS ABS: 0 10*3/uL (ref 0.0–0.1)
BASOS PCT: 0.4 % (ref 0.0–3.0)
Eosinophils Absolute: 0.1 10*3/uL (ref 0.0–0.7)
Eosinophils Relative: 2.7 % (ref 0.0–5.0)
HCT: 34.3 % — ABNORMAL LOW (ref 36.0–46.0)
Hemoglobin: 11.2 g/dL — ABNORMAL LOW (ref 12.0–15.0)
LYMPHS PCT: 34.6 % (ref 12.0–46.0)
Lymphs Abs: 1.6 10*3/uL (ref 0.7–4.0)
MCHC: 32.6 g/dL (ref 30.0–36.0)
MCV: 86.8 fl (ref 78.0–100.0)
MONO ABS: 0.3 10*3/uL (ref 0.1–1.0)
Monocytes Relative: 7.1 % (ref 3.0–12.0)
NEUTROS PCT: 55.2 % (ref 43.0–77.0)
Neutro Abs: 2.6 10*3/uL (ref 1.4–7.7)
PLATELETS: 249 10*3/uL (ref 150.0–400.0)
RBC: 3.95 Mil/uL (ref 3.87–5.11)
RDW: 14.4 % (ref 11.5–15.5)
WBC: 4.7 10*3/uL (ref 4.0–10.5)

## 2014-05-28 LAB — MICROALBUMIN / CREATININE URINE RATIO
CREATININE, U: 204.6 mg/dL
MICROALB UR: 1.2 mg/dL (ref 0.0–1.9)
Microalb Creat Ratio: 0.6 mg/g (ref 0.0–30.0)

## 2014-05-28 LAB — HEPATIC FUNCTION PANEL
ALBUMIN: 3.5 g/dL (ref 3.5–5.2)
ALK PHOS: 81 U/L (ref 39–117)
ALT: 21 U/L (ref 0–35)
AST: 27 U/L (ref 0–37)
Bilirubin, Direct: 0.1 mg/dL (ref 0.0–0.3)
TOTAL PROTEIN: 6.1 g/dL (ref 6.0–8.3)
Total Bilirubin: 0.5 mg/dL (ref 0.2–1.2)

## 2014-05-28 LAB — IBC PANEL
Iron: 51 ug/dL (ref 42–145)
Saturation Ratios: 11.8 % — ABNORMAL LOW (ref 20.0–50.0)
Transferrin: 309.4 mg/dL (ref 212.0–360.0)

## 2014-05-28 LAB — LIPID PANEL
CHOLESTEROL: 189 mg/dL (ref 0–200)
HDL: 47.6 mg/dL (ref >39.00)
NonHDL: 141.4
TRIGLYCERIDES: 219 mg/dL — AB (ref 0.0–149.0)
Total CHOL/HDL Ratio: 4
VLDL: 43.8 mg/dL — ABNORMAL HIGH (ref 0.0–40.0)

## 2014-05-28 LAB — LDL CHOLESTEROL, DIRECT: LDL DIRECT: 106.1 mg/dL

## 2014-05-28 LAB — HEMOGLOBIN A1C: Hgb A1c MFr Bld: 6.3 % (ref 4.6–6.5)

## 2014-05-29 ENCOUNTER — Encounter: Payer: Self-pay | Admitting: Internal Medicine

## 2014-05-31 ENCOUNTER — Encounter: Payer: Self-pay | Admitting: Internal Medicine

## 2014-05-31 ENCOUNTER — Ambulatory Visit (INDEPENDENT_AMBULATORY_CARE_PROVIDER_SITE_OTHER): Payer: Commercial Managed Care - HMO | Admitting: Internal Medicine

## 2014-05-31 VITALS — BP 110/70 | HR 82 | Temp 98.2°F | Ht 64.0 in | Wt 163.5 lb

## 2014-05-31 DIAGNOSIS — Z860100 Personal history of colon polyps, unspecified: Secondary | ICD-10-CM

## 2014-05-31 DIAGNOSIS — R7989 Other specified abnormal findings of blood chemistry: Secondary | ICD-10-CM

## 2014-05-31 DIAGNOSIS — Z1239 Encounter for other screening for malignant neoplasm of breast: Secondary | ICD-10-CM

## 2014-05-31 DIAGNOSIS — R6889 Other general symptoms and signs: Secondary | ICD-10-CM

## 2014-05-31 DIAGNOSIS — K219 Gastro-esophageal reflux disease without esophagitis: Secondary | ICD-10-CM

## 2014-05-31 DIAGNOSIS — M949 Disorder of cartilage, unspecified: Secondary | ICD-10-CM

## 2014-05-31 DIAGNOSIS — E7800 Pure hypercholesterolemia, unspecified: Secondary | ICD-10-CM

## 2014-05-31 DIAGNOSIS — Z0000 Encounter for general adult medical examination without abnormal findings: Secondary | ICD-10-CM

## 2014-05-31 DIAGNOSIS — Q2733 Arteriovenous malformation of digestive system vessel: Secondary | ICD-10-CM

## 2014-05-31 DIAGNOSIS — M8580 Other specified disorders of bone density and structure, unspecified site: Secondary | ICD-10-CM

## 2014-05-31 DIAGNOSIS — R945 Abnormal results of liver function studies: Secondary | ICD-10-CM

## 2014-05-31 DIAGNOSIS — M899 Disorder of bone, unspecified: Secondary | ICD-10-CM

## 2014-05-31 DIAGNOSIS — K589 Irritable bowel syndrome without diarrhea: Secondary | ICD-10-CM

## 2014-05-31 DIAGNOSIS — D649 Anemia, unspecified: Secondary | ICD-10-CM

## 2014-05-31 DIAGNOSIS — E119 Type 2 diabetes mellitus without complications: Secondary | ICD-10-CM

## 2014-05-31 DIAGNOSIS — K5520 Angiodysplasia of colon without hemorrhage: Secondary | ICD-10-CM

## 2014-05-31 DIAGNOSIS — I10 Essential (primary) hypertension: Secondary | ICD-10-CM

## 2014-05-31 NOTE — Telephone Encounter (Signed)
Unread mychart message mailed to patient 

## 2014-05-31 NOTE — Patient Instructions (Signed)
Flonase nasal spray or nasacort nasal spray - 2 sprays each nostril one time per day.  Do this in the evening.

## 2014-05-31 NOTE — Progress Notes (Signed)
Pre visit review using our clinic review tool, if applicable. No additional management support is needed unless otherwise documented below in the visit note. 

## 2014-06-03 ENCOUNTER — Encounter: Payer: Self-pay | Admitting: Internal Medicine

## 2014-06-03 NOTE — Assessment & Plan Note (Signed)
Described the increased drainage and sore throat.  Started nyquil and mucinex.  Better.  No chest congestion or significant cough.  No sob.  Continue using inhalers regularly.  Continue mucinex and robitussin as directed.  nasacort or flonase as directed.  Follow.  Hold on abx.

## 2014-06-03 NOTE — Assessment & Plan Note (Signed)
Had abdominal ultrasound previously.  Discussed with Dr Redmond Pulling.  Follow liver panel.  Diagnosed with fatty liver.

## 2014-06-03 NOTE — Assessment & Plan Note (Signed)
Follow cbc.  

## 2014-06-03 NOTE — Assessment & Plan Note (Signed)
Reflux controlled.  On omeprazole.  Follow.

## 2014-06-03 NOTE — Assessment & Plan Note (Signed)
Increased loose stool as outlined.  Just had colonoscopy as outlined.   Flares intermittently.  Continue to f/u with Dr Redmond Pulling.

## 2014-06-03 NOTE — Progress Notes (Signed)
Subjective:    Patient ID: Ashley Gross, female    DOB: 1951-06-06, 62 y.o.   MRN: [id number]  HPI 63 year old female with past history of hypertension, diabetes and psoriatic arthritis who comes in today to follow up on these issues as well as for a complete physical exam.   She saw Dr Maurene Capes for an abnormal ultrasound finding.  Diagnosed with fatty liver.  No obstruction found.  Off Humira.  She is trying to watch what she eats.  Sugars have been doing better.  Brought in no recorded sugar readings.   Breathing stable.  Eating and drinking ok.  She is still having the bowel issues.  Seeing Dr Redmond Pulling.  She just recently performed a colonoscopy.  Increased stress with her husband's medical issues.  Feels she is handling things relatively well.  Did develop increased drainage and sore throat.  Started taking mucinex and nyquil.  Is better now.  No chest tightness or sob.  No chest congestion.  No fever.      Past Medical History  Diagnosis Date  . Pure hypercholesterolemia   . Pernicious anemia   . Psoriatic arthritis     s/p penicillamine, plaquenil, MTX, sulfasalazine.  s/p Embrel, Humira,.  Iritis.    . Hypertension   . Anemia, unspecified   . IBS (irritable bowel syndrome)   . Nephrolithiasis   . H/O hiatal hernia   . Colitis   . Hx of arteriovenous malformation (AVM)   . Diabetes mellitus   . Esophagitis      Current Outpatient Prescriptions on File Prior to Visit  Medication Sig Dispense Refill  . albuterol (ACCUNEB) 0.63 MG/3ML nebulizer solution Take 1 ampule by nebulization every 6 (six) hours as needed for wheezing.      Marland Kitchen albuterol (PROVENTIL HFA;VENTOLIN HFA) 108 (90 BASE) MCG/ACT inhaler Inhale 2 puffs into the lungs every 6 (six) hours as needed.      Marland Kitchen amLODipine (NORVASC) 5 MG tablet Take 1 tablet (5 mg total) by mouth 2 (two) times daily.  180 tablet  1  . Calcium Carbonate-Vitamin D (CALTRATE 600+D) 600-400 MG-UNIT per tablet Take 1 tablet by mouth 2 (two) times  daily.      . Cholecalciferol (VITAMIN D-3) 1000 UNITS CAPS Take 1 capsule by mouth daily.      . citalopram (CELEXA) 10 MG tablet Take 1 tablet (10 mg total) by mouth daily.  90 tablet  0  . glipiZIDE (GLIPIZIDE XL) 5 MG 24 hr tablet Take 1 tablet (5 mg total) by mouth daily.  90 tablet  1  . glucose blood test strip accu-chek compact test strip Check blood sugars daily  100 each  12  . ipratropium (ATROVENT HFA) 17 MCG/ACT inhaler Inhale 2 puffs into the lungs every 6 (six) hours.      Marland Kitchen lisinopril (PRINIVIL,ZESTRIL) 40 MG tablet Take 1 tablet (40 mg total) by mouth daily.  90 tablet  1  . metFORMIN (GLUCOPHAGE) 1000 MG tablet Take 1 tablet (1,000 mg total) by mouth 2 (two) times daily with a meal.  180 tablet  3  . omeprazole (PRILOSEC) 20 MG capsule Take 1 capsule (20 mg total) by mouth daily.  90 capsule  3  . Pancrelipase, Lip-Prot-Amyl, 24000 UNITS CPEP Take 1 capsule by mouth 4 (four) times daily.      . pioglitazone (ACTOS) 15 MG tablet Take 1 tablet (15 mg total) by mouth daily.  90 tablet  1  . triamterene-hydrochlorothiazide (MAXZIDE-25) 37.5-25  MG per tablet Take 1 tablet by mouth daily.  90 tablet  1   No current facility-administered medications on file prior to visit.    Review of Systems Patient denies any headache, lightheadedness or dizziness. No sinus pressure.  Some increased drainage.   No chest pain, tightness or palpitations.  No increased shortness of breath or significant cough or congestion.  No nausea or vomiting.  Increased flares with her bowels.  Increased loose stool.  Multiple episodes of loose stool per day.  overall bowels stable.  Taking immodium.   No BRBPR or melana.  No urine change.  Started mucinex and nyquil.  Better.       Objective:   Physical Exam  Filed Vitals:   05/31/14 1550  BP: 110/70  Pulse: 82  Temp: 98.2 F (36.8 C)   Pulse recheck:  29  63 year old female in no acute distress.   HEENT:  Nares- clear.  Oropharynx - without  lesions. NECK:  Supple.  Nontender.  No audible bruit.  HEART:  Appears to be regular. LUNGS:  No crackles or wheezing audible.  Respirations even and unlabored.  RADIAL PULSE:  Equal bilaterally.    BREASTS:  No nipple discharge or nipple retraction present.  Could not appreciate any distinct nodules or axillary adenopathy.  ABDOMEN:  Soft, nontender.  Bowel sounds present and normal.  No audible abdominal bruit.  GU:  Not performed.     EXTREMITIES:  No increased edema present.  DP pulses palpable and equal bilaterally.      FEET:  No lesions.        Assessment & Plan:  CARDIOVASCULAR.  Stable.  Currently asymptomatic. Continue risk factor modification.   PULMONARY.  Breathing stable.  Follow.    HEALTH MAINTENANCE.  Physical today.  She is s/p hysterectomy.  Just had colonoscopy.  See report.  Mammogram 06/12/13 - Birads I.  Schedule f/u mammogram.    I spent 25 minutes with the patient and more than 50% of the time was spent in consultation regarding the above.

## 2014-06-03 NOTE — Assessment & Plan Note (Signed)
Low cholesterol diet and exercise.  Follow lipid panel.  Adjusting her diet.  Follow.

## 2014-06-03 NOTE — Assessment & Plan Note (Signed)
No significant problems with lows now.  Follow sugars.  Eat regular meals.  Check metabolic panel and M2X.   Follow.  Sugars improved.

## 2014-06-03 NOTE — Assessment & Plan Note (Signed)
Stable.  Follow.   

## 2014-06-03 NOTE — Assessment & Plan Note (Signed)
Blood pressure under good control.  Same medication.  Follow metabolic panel.

## 2014-06-03 NOTE — Assessment & Plan Note (Signed)
Colonoscopy as outlined.  Continue to follow up with Dr Redmond Pulling.

## 2014-06-03 NOTE — Assessment & Plan Note (Signed)
Last bone density revealed osteopenia with no significant change form previous.  Continue calcium and vitamin D.

## 2014-06-13 NOTE — Telephone Encounter (Signed)
error 

## 2014-07-17 ENCOUNTER — Other Ambulatory Visit: Payer: Self-pay | Admitting: Internal Medicine

## 2014-12-05 ENCOUNTER — Encounter: Payer: Self-pay | Admitting: Internal Medicine

## 2014-12-11 ENCOUNTER — Ambulatory Visit (INDEPENDENT_AMBULATORY_CARE_PROVIDER_SITE_OTHER): Payer: PPO | Admitting: Internal Medicine

## 2014-12-11 ENCOUNTER — Encounter: Payer: Self-pay | Admitting: Internal Medicine

## 2014-12-11 VITALS — BP 120/70 | HR 71 | Temp 98.2°F | Ht 64.0 in | Wt 170.0 lb

## 2014-12-11 DIAGNOSIS — I10 Essential (primary) hypertension: Secondary | ICD-10-CM

## 2014-12-11 DIAGNOSIS — E7800 Pure hypercholesterolemia, unspecified: Secondary | ICD-10-CM

## 2014-12-11 DIAGNOSIS — Z658 Other specified problems related to psychosocial circumstances: Secondary | ICD-10-CM

## 2014-12-11 DIAGNOSIS — R945 Abnormal results of liver function studies: Secondary | ICD-10-CM

## 2014-12-11 DIAGNOSIS — K589 Irritable bowel syndrome without diarrhea: Secondary | ICD-10-CM

## 2014-12-11 DIAGNOSIS — Z86010 Personal history of colonic polyps: Secondary | ICD-10-CM

## 2014-12-11 DIAGNOSIS — D649 Anemia, unspecified: Secondary | ICD-10-CM

## 2014-12-11 DIAGNOSIS — R7989 Other specified abnormal findings of blood chemistry: Secondary | ICD-10-CM

## 2014-12-11 DIAGNOSIS — L989 Disorder of the skin and subcutaneous tissue, unspecified: Secondary | ICD-10-CM

## 2014-12-11 DIAGNOSIS — F439 Reaction to severe stress, unspecified: Secondary | ICD-10-CM

## 2014-12-11 DIAGNOSIS — E119 Type 2 diabetes mellitus without complications: Secondary | ICD-10-CM

## 2014-12-11 DIAGNOSIS — K219 Gastro-esophageal reflux disease without esophagitis: Secondary | ICD-10-CM

## 2014-12-11 DIAGNOSIS — Z0000 Encounter for general adult medical examination without abnormal findings: Secondary | ICD-10-CM

## 2014-12-11 MED ORDER — TRIAMTERENE-HCTZ 37.5-25 MG PO TABS: 1.0000 | ORAL_TABLET | Freq: Every day | ORAL | Status: DC

## 2014-12-11 MED ORDER — LISINOPRIL 40 MG PO TABS: 40.0000 mg | ORAL_TABLET | Freq: Every day | ORAL | Status: DC

## 2014-12-11 MED ORDER — AMLODIPINE BESYLATE 5 MG PO TABS: 5.0000 mg | ORAL_TABLET | Freq: Two times a day (BID) | ORAL | Status: DC

## 2014-12-11 MED ORDER — CITALOPRAM HYDROBROMIDE 10 MG PO TABS: 10.0000 mg | ORAL_TABLET | Freq: Every day | ORAL | Status: DC

## 2014-12-11 MED ORDER — PIOGLITAZONE HCL 15 MG PO TABS: 15.0000 mg | ORAL_TABLET | Freq: Every day | ORAL | Status: DC

## 2014-12-11 MED ORDER — GLIPIZIDE ER 5 MG PO TB24: 5.0000 mg | ORAL_TABLET | Freq: Every day | ORAL | Status: DC

## 2014-12-11 MED ORDER — OMEPRAZOLE 20 MG PO CPDR: 20.0000 mg | DELAYED_RELEASE_CAPSULE | Freq: Every day | ORAL | Status: DC

## 2014-12-11 MED ORDER — METFORMIN HCL 1000 MG PO TABS: 1000.0000 mg | ORAL_TABLET | Freq: Two times a day (BID) | ORAL | Status: DC

## 2014-12-11 NOTE — Progress Notes (Signed)
Pre visit review using our clinic review tool, if applicable. No additional management support is needed unless otherwise documented below in the visit note. 

## 2014-12-11 NOTE — Progress Notes (Signed)
Patient ID: Ashley Gross, female   DOB: [birth year], 64 y.o.   MRN: [id number]   Subjective:    Patient ID: Ashley Gross, female    DOB: [birth year], 64 y.o.   MRN: [id number]  HPI  Patient here for a scheduled follow up.  Has been under increased stress recently with her sister's health issues.  She has been in [facility] taking care of her.  She is now back home.  Also increased stress with her husband's medical issues.  Overall she feels she is handling things relatively well.  She does report increased pain in her left wrist.  Hurts with flexion of her wrist.  She has been out of her lisinopril and amlodipine for the last few days.  Out of actos for a few weeks.  States am sugars averaging 130s.  No cardiac symptoms with increased activity and exertion.  Persistent back lesion.     Past Medical History  Diagnosis Date  . Pure hypercholesterolemia   . Pernicious anemia   . Psoriatic arthritis     s/p penicillamine, plaquenil, MTX, sulfasalazine.  s/p Embrel, Humira,.  Iritis.    . Hypertension   . Anemia, unspecified   . IBS (irritable bowel syndrome)   . Nephrolithiasis   . H/O hiatal hernia   . Colitis   . Hx of arteriovenous malformation (AVM)   . Diabetes mellitus   . Esophagitis     Current Outpatient Prescriptions on File Prior to Visit  Medication Sig Dispense Refill  . ACCU-CHEK COMPACT PLUS test strip CHECK BLOOD SUGAR ONCE DAILY AS DIRECTED 100 each 5  . albuterol (ACCUNEB) 0.63 MG/3ML nebulizer solution Take 1 ampule by nebulization every 6 (six) hours as needed for wheezing.    Marland Kitchen albuterol (PROVENTIL HFA;VENTOLIN HFA) 108 (90 BASE) MCG/ACT inhaler Inhale 2 puffs into the lungs every 6 (six) hours as needed.    . Calcium Carbonate-Vitamin D (CALTRATE 600+D) 600-400 MG-UNIT per tablet Take 1 tablet by mouth 2 (two) times daily.    . Cholecalciferol (VITAMIN D-3) 1000 UNITS CAPS Take 1 capsule by mouth daily.    Marland Kitchen ipratropium (ATROVENT HFA) 17 MCG/ACT inhaler Inhale 2 puffs  into the lungs every 6 (six) hours.    . Pancrelipase, Lip-Prot-Amyl, 24000 UNITS CPEP Take 1 capsule by mouth 4 (four) times daily.     No current facility-administered medications on file prior to visit.    Review of Systems  Constitutional: Positive for fatigue (increased fatigue with the increased stress.  ). Negative for unexpected weight change.  HENT: Negative for congestion and sinus pressure.   Respiratory: Negative for cough, chest tightness and shortness of breath.   Cardiovascular: Negative for chest pain, palpitations and leg swelling.  Gastrointestinal: Positive for diarrhea (bowels stable.  still flares at times.  ). Negative for nausea, vomiting and abdominal pain.  Musculoskeletal: Negative for joint swelling.       Increased left wrist pain as outlined.  Increased pain with flexion of her left wrist.    Skin: Negative for color change and rash.  Neurological: Negative for dizziness, light-headedness and headaches.  Psychiatric/Behavioral:       Increased stress.  Feels she is coping relatively well.         Objective:    Physical Exam  HENT:  Nose: Nose normal.  Mouth/Throat: Oropharynx is clear and moist.  Neck: Neck supple. No thyromegaly present.  Cardiovascular: Normal rate and regular rhythm.   Pulmonary/Chest: Breath sounds normal. No respiratory distress.  She has no wheezes.  Abdominal: Soft. Bowel sounds are normal. There is no tenderness.  Musculoskeletal: She exhibits no edema or tenderness.  Lymphadenopathy:    She has no cervical adenopathy.  Skin: No erythema.  Persistent right side back lesion.     BP 120/70 mmHg  Pulse 71  Temp(Src) 98.2 F (36.8 C) (Oral)  Ht 5\' 4"  (1.626 m)  Wt 170 lb (77.111 kg)  BMI 29.17 kg/m2  SpO2 97% Wt Readings from Last 3 Encounters:  12/11/14 170 lb (77.111 kg)  05/31/14 163 lb 8 oz (74.163 kg)  02/15/14 170 lb 12 oz (77.452 kg)     Lab Results  Component Value Date   WBC 4.7 05/28/2014   HGB 11.2*  05/28/2014   HCT 34.3* 05/28/2014   PLT 249.0 05/28/2014   GLUCOSE 98 05/28/2014   CHOL 189 05/28/2014   TRIG 219.0* 05/28/2014   HDL 47.60 05/28/2014   LDLDIRECT 106.1 05/28/2014   LDLCALC 128* 01/17/2014   ALT 21 05/28/2014   AST 27 05/28/2014   NA 137 05/28/2014   K 4.2 05/28/2014   CL 102 05/28/2014   CREATININE 1.1 05/28/2014   BUN 15 05/28/2014   CO2 26 05/28/2014   TSH 0.99 01/17/2014   HGBA1C 6.3 05/28/2014   MICROALBUR 1.2 05/28/2014        Assessment & Plan:   Problem List Items Addressed This Visit    Abnormal liver function test    Has had abdominal ultrasound previously.  Discussed with Dr Redmond Pulling.  Follow liver panel.  Diagnosed with fatty liver.        Anemia    Check cbc.        Diabetes mellitus    Sugars as outlined.  Off actos.  Plans to restart.  Check metabolic panel and K9T.        Relevant Medications   glipiZIDE (GLUCOTROL XL) 24 hr tablet   lisinopril (PRINIVIL,ZESTRIL) tablet   metFORMIN (GLUCOPHAGE) tablet   pioglitazone (ACTOS) tablet   GERD (gastroesophageal reflux disease)    Symptoms controlled on current regimen.        Relevant Medications   omeprazole (PRILOSEC) capsule   Health care maintenance    Schedule mammogram.  Overdue.        History of colonic polyps    02/08/13 - colonoscopy as outlined in overview.  Bowels stable.  Follow.        Hypercholesterolemia    Low cholesterol diet and exercise.  Check lipid panel.        Relevant Medications   amLODIpine (NORVASC) tablet   lisinopril (PRINIVIL,ZESTRIL) tablet   triamterene-hydrochlorothiazide (MAXZIDE-25) 37.5-25 MG per tablet   Hypertension    Blood pressure has been doing well.  Same medication regimen.  Check metabolic panel.       Relevant Medications   amLODIpine (NORVASC) tablet   lisinopril (PRINIVIL,ZESTRIL) tablet   triamterene-hydrochlorothiazide (MAXZIDE-25) 37.5-25 MG per tablet   IBS (irritable bowel syndrome)    Flares intermittently.  Has  previously been followed by Dr Evalee Mutton.       Relevant Medications   omeprazole (PRILOSEC) capsule   Stress    She feels she is handling stress relatively well.  Follow.         Other Visit Diagnoses    Skin lesion of back    -  Primary    Relevant Orders    Ambulatory referral to Dermatology      I spent 25 minutes with the patient  and more than 50% of the time was spent in consultation regarding the above.     Einar Pheasant, MD

## 2014-12-12 ENCOUNTER — Encounter: Payer: Self-pay | Admitting: Internal Medicine

## 2014-12-12 ENCOUNTER — Ambulatory Visit: Payer: Self-pay | Admitting: Internal Medicine

## 2014-12-12 DIAGNOSIS — F439 Reaction to severe stress, unspecified: Secondary | ICD-10-CM | POA: Insufficient documentation

## 2014-12-12 LAB — HM MAMMOGRAPHY: HM Mammogram: NEGATIVE

## 2014-12-12 NOTE — Assessment & Plan Note (Signed)
Symptoms controlled on current regimen.   

## 2014-12-12 NOTE — Assessment & Plan Note (Signed)
02/08/13 - colonoscopy as outlined in overview.  Bowels stable.  Follow.

## 2014-12-12 NOTE — Assessment & Plan Note (Signed)
Check cbc 

## 2014-12-12 NOTE — Assessment & Plan Note (Signed)
Flares intermittently.  Has previously been followed by Dr Evalee Mutton.

## 2014-12-12 NOTE — Assessment & Plan Note (Signed)
Schedule mammogram.  Overdue.

## 2014-12-12 NOTE — Assessment & Plan Note (Signed)
Blood pressure has been doing well.  Same medication regimen.  Check metabolic panel.

## 2014-12-12 NOTE — Assessment & Plan Note (Signed)
Low cholesterol diet and exercise.  Check lipid panel.   

## 2014-12-12 NOTE — Assessment & Plan Note (Signed)
Has had abdominal ultrasound previously.  Discussed with Dr Redmond Pulling.  Follow liver panel.  Diagnosed with fatty liver.

## 2014-12-12 NOTE — Assessment & Plan Note (Signed)
She feels she is handling stress relatively well.  Follow.

## 2014-12-12 NOTE — Assessment & Plan Note (Signed)
Sugars as outlined.  Off actos.  Plans to restart.  Check metabolic panel and F1M.

## 2014-12-18 ENCOUNTER — Other Ambulatory Visit (INDEPENDENT_AMBULATORY_CARE_PROVIDER_SITE_OTHER): Payer: PPO

## 2014-12-18 DIAGNOSIS — R7989 Other specified abnormal findings of blood chemistry: Secondary | ICD-10-CM | POA: Diagnosis not present

## 2014-12-18 DIAGNOSIS — D649 Anemia, unspecified: Secondary | ICD-10-CM

## 2014-12-18 DIAGNOSIS — R945 Abnormal results of liver function studies: Secondary | ICD-10-CM

## 2014-12-18 DIAGNOSIS — E7800 Pure hypercholesterolemia, unspecified: Secondary | ICD-10-CM

## 2014-12-18 DIAGNOSIS — E119 Type 2 diabetes mellitus without complications: Secondary | ICD-10-CM

## 2014-12-18 LAB — BASIC METABOLIC PANEL
BUN: 17 mg/dL (ref 6–23)
CO2: 28 meq/L (ref 19–32)
CREATININE: 1.17 mg/dL (ref 0.40–1.20)
Calcium: 8.8 mg/dL (ref 8.4–10.5)
Chloride: 104 mEq/L (ref 96–112)
GFR: 49.51 mL/min — ABNORMAL LOW (ref >60.00)
GLUCOSE: 112 mg/dL — AB (ref 70–99)
Potassium: 4.4 mEq/L (ref 3.5–5.1)
SODIUM: 137 meq/L (ref 135–145)

## 2014-12-18 LAB — CBC WITH DIFFERENTIAL/PLATELET
BASOS ABS: 0 10*3/uL (ref 0.0–0.1)
Basophils Relative: 0.7 % (ref 0.0–3.0)
Eosinophils Absolute: 0.1 10*3/uL (ref 0.0–0.7)
Eosinophils Relative: 1.7 % (ref 0.0–5.0)
HCT: 39 % (ref 36.0–46.0)
HEMOGLOBIN: 12.9 g/dL (ref 12.0–15.0)
Lymphocytes Relative: 32.4 % (ref 12.0–46.0)
Lymphs Abs: 2 10*3/uL (ref 0.7–4.0)
MCHC: 33.1 g/dL (ref 30.0–36.0)
MCV: 88 fl (ref 78.0–100.0)
MONOS PCT: 6.7 % (ref 3.0–12.0)
Monocytes Absolute: 0.4 10*3/uL (ref 0.1–1.0)
NEUTROS PCT: 58.5 % (ref 43.0–77.0)
Neutro Abs: 3.7 10*3/uL (ref 1.4–7.7)
PLATELETS: 229 10*3/uL (ref 150.0–400.0)
RBC: 4.43 Mil/uL (ref 3.87–5.11)
RDW: 14.1 % (ref 11.5–15.5)
WBC: 6.3 10*3/uL (ref 4.0–10.5)

## 2014-12-18 LAB — HEPATIC FUNCTION PANEL
ALK PHOS: 82 U/L (ref 39–117)
ALT: 13 U/L (ref 0–35)
AST: 16 U/L (ref 0–37)
Albumin: 3.7 g/dL (ref 3.5–5.2)
BILIRUBIN DIRECT: 0 mg/dL (ref 0.0–0.3)
BILIRUBIN TOTAL: 0.4 mg/dL (ref 0.2–1.2)
TOTAL PROTEIN: 6.4 g/dL (ref 6.0–8.3)

## 2014-12-18 LAB — LIPID PANEL
Cholesterol: 265 mg/dL — ABNORMAL HIGH (ref 0–200)
HDL: 50.1 mg/dL (ref >39.00)
NonHDL: 214.9
TRIGLYCERIDES: 353 mg/dL — AB (ref 0.0–149.0)
Total CHOL/HDL Ratio: 5
VLDL: 70.6 mg/dL — ABNORMAL HIGH (ref 0.0–40.0)

## 2014-12-18 LAB — LDL CHOLESTEROL, DIRECT: Direct LDL: 129 mg/dL

## 2014-12-18 LAB — FERRITIN: FERRITIN: 12.2 ng/mL (ref 10.0–291.0)

## 2014-12-18 LAB — HEMOGLOBIN A1C: Hgb A1c MFr Bld: 6.3 % (ref 4.6–6.5)

## 2014-12-19 ENCOUNTER — Encounter: Payer: Self-pay | Admitting: Internal Medicine

## 2015-02-22 NOTE — Op Note (Signed)
PATIENT NAME:  Ashley Gross, Ashley Gross MR#:  [id number] DATE OF BIRTH:  06-27-1951  DATE OF PROCEDURE:  06/19/2013  PREOPERATIVE DIAGNOSIS:  Cataract, left eye.    POSTOPERATIVE DIAGNOSIS:  Cataract, left eye.  PROCEDURE PERFORMED:  Extracapsular cataract extraction using phacoemulsification with placement of an Alcon [id number], 19.5-diopter posterior chamber lens, serial [id number].  SURGEON:  Loura Back. Porshe Fleagle, MD  ASSISTANT:  None.  ANESTHESIA:  4% lidocaine and 0.75% Marcaine in a 50/50 mixture with 10 units/mL of Hylenex added, given as a peribulbar.   ANESTHESIOLOGIST:  Dr. Kayleen Memos.  COMPLICATIONS:  None.  ESTIMATED BLOOD LOSS:  Less than 1 ml.  DESCRIPTION OF PROCEDURE:  The patient was brought to the operating room and given a peribulbar block.  The patient was then prepped and draped in the usual fashion.  The vertical rectus muscles were imbricated using 5-0 silk sutures.  These sutures were then clamped to the sterile drapes as bridle sutures.  A limbal peritomy was performed extending two clock hours and hemostasis was obtained with cautery.  A partial thickness scleral groove was made at the surgical limbus and dissected anteriorly in a lamellar dissection using an Alcon crescent knife.  The anterior chamber was entered supero-temporally with a Superblade and through the lamellar dissection with a 2.6 mm keratome.  DisCoVisc was used to replace the aqueous and a continuous tear capsulorrhexis was carried out.  Hydrodissection and hydrodelineation were carried out with balanced salt and a 27 gauge canula.  The nucleus was rotated to confirm the effectiveness of the hydrodissection.  Phacoemulsification was carried out using a divide-and-conquer technique.  Total ultrasound time was 1 minute and 9 seconds with an average power of 22.4 percent and CDE of 31.87.  Irrigation/aspiration was used to remove the residual cortex.  DisCoVisc was used to inflate the capsule and the internal  incision was enlarged to 3 mm with the crescent knife.  The intraocular lens was folded and inserted into the capsular bag using the AcrySert delivery system.  Irrigation/aspiration was used to remove the residual DisCoVisc.  Miostat was injected into the anterior chamber through the paracentesis track to inflate the anterior chamber and induce miosis.  The wound was checked for leaks and none were found. The conjunctiva was closed with cautery and the bridle sutures were removed.  Two drops of 0.3% Vigamox were placed on the eye.   An eye shield was placed on the eye.  The patient was discharged to the recovery room in good condition. ____________________________ Loura Back Deforrest Bogle, MD sad:sb D: 06/19/2013 13:56:16 ET T: 06/19/2013 15:43:49 ET JOB#: [id number]  cc: Remo Lipps A. Benjiman Sedgwick, MD, <Dictator> Ashley Lee MD ELECTRONICALLY SIGNED 06/21/2013 15:23

## 2015-02-24 NOTE — Op Note (Signed)
PATIENT NAME:  Ashley Gross, Ashley Gross MR#:  [id number] DATE OF BIRTH:  Jan 17, 1951  DATE OF PROCEDURE:  04/29/2012  PREOPERATIVE DIAGNOSIS: Ruptured extensor tendon, right long finger.   POSTOPERATIVE DIAGNOSIS: Ruptured extensor tendon, right long finger.   PROCEDURES: 1. Tenolysis extensor tendon, right long finger.  2. Direct repair extensor tendon, right long finger.   SURGEON: Lucas Mallow, MD  ANESTHESIA: General.   COMPLICATIONS: None.   TOURNIQUET TIME: 76 minutes.   DESCRIPTION OF PROCEDURE: After adequate induction of general anesthesia and intravenous administration of 1 gram of Ancef, the right upper extremity is thoroughly prepped with alcohol and ChloraPrep and draped in standard sterile fashion. Extremity is wrapped out with the Esmarch bandage and pneumatic tourniquet elevated to 250 mmHg. Under loupe magnification curved incision is made over the dorsum of the MP joint of the right long finger extending proximally across the area of the tendon rupture. The dissection is carefully carried down with loupe magnification. There is seen to be extensive tenosynovium hypertrophy and this is all excised. There also is seen to be a cystic area in the hood of the MP joint extensor mechanism which is fluid filled and this all evacuated and debrided. The junctura from the adjacent tendons are cut off of the right long finger tendon and preserved. The area of scar tissue at the tendon rupture is completely excised. The normal tendon tissue is then advanced and sewn to the extensor tendon hood using a modified Kessler type suture with a 3-0 Mersilene suture.  The wings of the extensor hood are then repaired to the sides of the tendon using modified Kessler suture with 4-0 Mersilene suture. The junctura are repaired back anatomically to the tendon from the adjacent tendons using 5-0 Vicryl. The wound is thoroughly irrigated multiple times. Skin edges are infiltrated with 0.5% plain Marcaine.  The passive position of the tendon and the tension in the tendon is seen to be appropriate with slight tightness as compared to the opposite tendon. Skin is closed with multiple 4-0 nylon sutures. Soft bulky dressing is applied with a volar splint keeping the finger at the MP joint flexed approximately 20 degrees with about 40 degrees of wrist dorsiflexion. Tourniquet is released. Patient is returned to recovery room in satisfactory condition having tolerated the procedure quite well.    ____________________________ Lucas Mallow, MD ces:cms D: 04/29/2012 09:38:58 ET T: 04/29/2012 11:31:48 ET JOB#: [id number]  cc: Lucas Mallow, MD, <Dictator> Lucas Mallow MD ELECTRONICALLY SIGNED 05/05/2012 8:35

## 2015-03-06 ENCOUNTER — Other Ambulatory Visit: Payer: Self-pay | Admitting: *Deleted

## 2015-03-06 MED ORDER — CITALOPRAM HYDROBROMIDE 10 MG PO TABS: 10.0000 mg | ORAL_TABLET | Freq: Every day | ORAL | Status: DC

## 2015-03-12 ENCOUNTER — Ambulatory Visit (INDEPENDENT_AMBULATORY_CARE_PROVIDER_SITE_OTHER): Payer: PPO | Admitting: Internal Medicine

## 2015-03-12 ENCOUNTER — Encounter: Payer: Self-pay | Admitting: Internal Medicine

## 2015-03-12 ENCOUNTER — Ambulatory Visit (INDEPENDENT_AMBULATORY_CARE_PROVIDER_SITE_OTHER)
Admission: RE | Admit: 2015-03-12 | Discharge: 2015-03-12 | Disposition: A | Discharge status: Home / Self Care | Payer: PPO | Source: Ambulatory Visit | Attending: Internal Medicine | Admitting: Internal Medicine

## 2015-03-12 VITALS — BP 102/67 | HR 88 | Temp 98.1°F | Ht 64.0 in | Wt 174.5 lb

## 2015-03-12 DIAGNOSIS — R945 Abnormal results of liver function studies: Secondary | ICD-10-CM

## 2015-03-12 DIAGNOSIS — M25511 Pain in right shoulder: Secondary | ICD-10-CM

## 2015-03-12 DIAGNOSIS — K219 Gastro-esophageal reflux disease without esophagitis: Secondary | ICD-10-CM

## 2015-03-12 DIAGNOSIS — Z658 Other specified problems related to psychosocial circumstances: Secondary | ICD-10-CM

## 2015-03-12 DIAGNOSIS — M25571 Pain in right ankle and joints of right foot: Secondary | ICD-10-CM

## 2015-03-12 DIAGNOSIS — F439 Reaction to severe stress, unspecified: Secondary | ICD-10-CM

## 2015-03-12 DIAGNOSIS — I10 Essential (primary) hypertension: Secondary | ICD-10-CM

## 2015-03-12 DIAGNOSIS — M8580 Other specified disorders of bone density and structure, unspecified site: Secondary | ICD-10-CM

## 2015-03-12 DIAGNOSIS — K589 Irritable bowel syndrome without diarrhea: Secondary | ICD-10-CM

## 2015-03-12 DIAGNOSIS — Z86010 Personal history of colonic polyps: Secondary | ICD-10-CM

## 2015-03-12 DIAGNOSIS — Z0000 Encounter for general adult medical examination without abnormal findings: Secondary | ICD-10-CM

## 2015-03-12 DIAGNOSIS — D649 Anemia, unspecified: Secondary | ICD-10-CM

## 2015-03-12 DIAGNOSIS — R7989 Other specified abnormal findings of blood chemistry: Secondary | ICD-10-CM

## 2015-03-12 DIAGNOSIS — E7800 Pure hypercholesterolemia, unspecified: Secondary | ICD-10-CM

## 2015-03-12 DIAGNOSIS — E119 Type 2 diabetes mellitus without complications: Secondary | ICD-10-CM

## 2015-03-12 MED ORDER — HYDROCODONE-ACETAMINOPHEN 5-325 MG PO TABS: 1.0000 | ORAL_TABLET | Freq: Two times a day (BID) | ORAL | Status: DC | PRN

## 2015-03-12 MED ORDER — AMOXICILLIN-POT CLAVULANATE 875-125 MG PO TABS: 1.0000 | ORAL_TABLET | Freq: Two times a day (BID) | ORAL | Status: DC

## 2015-03-12 MED ORDER — GLUCOSE BLOOD VI STRP: ORAL_STRIP | Status: DC

## 2015-03-12 NOTE — Progress Notes (Signed)
Pre visit review using our clinic review tool, if applicable. No additional management support is needed unless otherwise documented below in the visit note. 

## 2015-03-12 NOTE — Progress Notes (Signed)
Patient ID: Ashley Gross, female   DOB: 25-Apr-1951, 64 y.o.   MRN: [id number]   Subjective:    Patient ID: Ashley Gross, female    DOB: February 24, 1951, 64 y.o.   MRN: [id number]  HPI  Patient here for a scheduled follow up.  States that 10 days ago, she fell.  Twisted her right ankle.  Right shoulder hit wall.  Still with pain. Limited rom of right shoulder - especially if above 90 degrees.  Ankle will swell if on.  Increased pain.  No head injury.  Also broke a tooth recently.  Planning to see the dentist next week.  Left cheek swollen.  Increased pain.  Has had chills and fever.  Some nausea yesterday.  Some decreased appetite since this started with her tooth.  States sugars have been doing well.  Am sugars 118 to highest 134.  No significant problems with low blood sugars.     Past Medical History  Diagnosis Date  . Pure hypercholesterolemia   . Pernicious anemia   . Psoriatic arthritis     s/p penicillamine, plaquenil, MTX, sulfasalazine.  s/p Embrel, Humira,.  Iritis.    . Hypertension   . Anemia, unspecified   . IBS (irritable bowel syndrome)   . Nephrolithiasis   . H/O hiatal hernia   . Colitis   . Hx of arteriovenous malformation (AVM)   . Diabetes mellitus   . Esophagitis     Current Outpatient Prescriptions on File Prior to Visit  Medication Sig Dispense Refill  . albuterol (ACCUNEB) 0.63 MG/3ML nebulizer solution Take 1 ampule by nebulization every 6 (six) hours as needed for wheezing.    Marland Kitchen albuterol (PROVENTIL HFA;VENTOLIN HFA) 108 (90 BASE) MCG/ACT inhaler Inhale 2 puffs into the lungs every 6 (six) hours as needed.    Marland Kitchen amLODipine (NORVASC) 5 MG tablet Take 1 tablet (5 mg total) by mouth 2 (two) times daily. 60 tablet 5  . Calcium Carbonate-Vitamin D (CALTRATE 600+D) 600-400 MG-UNIT per tablet Take 1 tablet by mouth 2 (two) times daily.    . Cholecalciferol (VITAMIN D-3) 1000 UNITS CAPS Take 1 capsule by mouth daily.    . citalopram (CELEXA) 10 MG tablet Take 1  tablet (10 mg total) by mouth daily. 30 tablet 2  . glipiZIDE (GLIPIZIDE XL) 5 MG 24 hr tablet Take 1 tablet (5 mg total) by mouth daily. 90 tablet 1  . ipratropium (ATROVENT HFA) 17 MCG/ACT inhaler Inhale 2 puffs into the lungs every 6 (six) hours.    Marland Kitchen lisinopril (PRINIVIL,ZESTRIL) 40 MG tablet Take 1 tablet (40 mg total) by mouth daily. 30 tablet 5  . metFORMIN (GLUCOPHAGE) 1000 MG tablet Take 1 tablet (1,000 mg total) by mouth 2 (two) times daily with a meal. 60 tablet 5  . omeprazole (PRILOSEC) 20 MG capsule Take 1 capsule (20 mg total) by mouth daily. 30 capsule 11  . Pancrelipase, Lip-Prot-Amyl, 24000 UNITS CPEP Take 1 capsule by mouth 4 (four) times daily.    . pioglitazone (ACTOS) 15 MG tablet Take 1 tablet (15 mg total) by mouth daily. 30 tablet 5  . triamterene-hydrochlorothiazide (MAXZIDE-25) 37.5-25 MG per tablet Take 1 tablet by mouth daily. 30 tablet 5   No current facility-administered medications on file prior to visit.    Review of Systems  Constitutional: Positive for fever, chills and appetite change. Negative for unexpected weight change.  HENT: Negative for congestion and sinus pressure.        Tooth pain and jaw swelling  as outlined.    Respiratory: Negative for cough, chest tightness and shortness of breath.   Cardiovascular: Negative for chest pain, palpitations and leg swelling.  Gastrointestinal: Positive for nausea. Negative for vomiting and abdominal pain.       Bowels still flare intermittently.    Musculoskeletal:       Persistent right shoulder pain with pain with rom - especially above 90 degrees.  Persistent right ankle pain and swelling.   Skin: Negative for color change and rash.  Neurological: Negative for dizziness, light-headedness and headaches.  Hematological: Negative for adenopathy. Does not bruise/bleed easily.  Psychiatric/Behavioral: Negative for dysphoric mood and agitation.       Objective:     Blood pressure recheck:  112/62  Physical  Exam  Constitutional: She appears well-developed and well-nourished. No distress.  HENT:  Nose: Nose normal.  Mouth/Throat: Oropharynx is clear and moist.  Increased swelling - left jaw.  Increased pain - gum/tooth and over left jaw.   Neck: Neck supple. No thyromegaly present.  Cardiovascular: Normal rate and regular rhythm.   Pulmonary/Chest: Breath sounds normal. No respiratory distress. She has no wheezes.  Abdominal: Soft. Bowel sounds are normal. There is no tenderness.  Musculoskeletal: She exhibits no edema.  Increased pain with abduction when approaching 90 degrees - right arm.  Also increased soft tissue swelling and pain to palpation - right ankle.  Increased pain with inversion of right foot.    Lymphadenopathy:    She has no cervical adenopathy.  Skin: No rash noted. No erythema.  Psychiatric: She has a normal mood and affect. Her behavior is normal.    BP 102/67 mmHg  Pulse 88  Temp(Src) 98.1 F (36.7 C) (Oral)  Ht '5\' 4"'  (1.626 m)  Wt 174 lb 8 oz (79.153 kg)  BMI 29.94 kg/m2  SpO2 97% Wt Readings from Last 3 Encounters:  03/12/15 174 lb 8 oz (79.153 kg)  12/11/14 170 lb (77.111 kg)  05/31/14 163 lb 8 oz (74.163 kg)     Lab Results  Component Value Date   WBC 6.3 12/18/2014   HGB 12.9 12/18/2014   HCT 39.0 12/18/2014   PLT 229.0 12/18/2014   GLUCOSE 112* 12/18/2014   CHOL 265* 12/18/2014   TRIG 353.0* 12/18/2014   HDL 50.10 12/18/2014   LDLDIRECT 129.0 12/18/2014   LDLCALC 128* 01/17/2014   ALT 13 12/18/2014   AST 16 12/18/2014   NA 137 12/18/2014   K 4.4 12/18/2014   CL 104 12/18/2014   CREATININE 1.17 12/18/2014   BUN 17 12/18/2014   CO2 28 12/18/2014   TSH 0.99 01/17/2014   HGBA1C 6.3 12/18/2014   MICROALBUR 1.2 05/28/2014       Assessment & Plan:   Problem List Items Addressed This Visit    Abnormal liver function test    Is s/p abdominal ultrasound.  Discussed with Dr Redmond Pulling.  Follow liver panel.  Diagnosed with fatty liver.         Relevant Orders   Hepatic function panel   Anemia    Follow cbc.       Diabetes mellitus    Sugars as outlined.  Follow met b and a1c.  Diabetic diet.        Relevant Orders   Hemoglobin A1c   GERD (gastroesophageal reflux disease)    Symptoms controlled.        Health care maintenance    Physical 05/31/14.  S/p hysterectomy.  Mammogram 12/12/14 - birads I.  History of colonic polyps    Colonoscopy 02/08/13 as outlined.        Hypercholesterolemia    Low cholesterol diet and exercise.  Follow lipid panel.       Relevant Orders   Lipid panel   Hypertension    Blood pressure has been doing well.  Same medication regimen.  Follow pressures.  Follow metabolic panel.       Relevant Orders   Basic metabolic panel   IBS (irritable bowel syndrome)    Sees Dr Evalee Mutton.  Flares intermittently.  Stable.  Follow.       Osteopenia    Last bone density - osteopenia - no significant change.  Continue calcium and vitamin D.       Relevant Orders   Vit D  25 hydroxy (rtn osteoporosis monitoring)   Right ankle pain    Persistent pain s/p injury.  Check xray.  May need ortho referral.  Laced ankle support.       Relevant Orders   DG Ankle Complete Right (Completed)   Right shoulder pain - Primary    Persistent pain and limited rom with abduction as outlined.  S/p fall/injury. Check xray.  May need ortho referral.       Relevant Orders   DG Shoulder Right (Completed)   Stress    Appears to be handling stress relatively well.  Follow.         I spent 25 minutes with the patient and more than 50% of the time was spent in consultation regarding the above.     Einar Pheasant, MD

## 2015-03-13 ENCOUNTER — Encounter: Payer: Self-pay | Admitting: *Deleted

## 2015-03-13 DIAGNOSIS — M25511 Pain in right shoulder: Secondary | ICD-10-CM

## 2015-03-13 DIAGNOSIS — S82891A Other fracture of right lower leg, initial encounter for closed fracture: Secondary | ICD-10-CM

## 2015-03-13 DIAGNOSIS — M25579 Pain in unspecified ankle and joints of unspecified foot: Secondary | ICD-10-CM

## 2015-03-13 NOTE — Addendum Note (Signed)
Addended by: Alisa Graff on: 03/13/2015 03:44 PM   Modules accepted: Orders

## 2015-03-13 NOTE — Telephone Encounter (Signed)
Order placed for ortho referral.   

## 2015-03-14 NOTE — Telephone Encounter (Signed)
Unread mychart message mailed to patient 

## 2015-03-17 ENCOUNTER — Encounter: Payer: Self-pay | Admitting: Internal Medicine

## 2015-03-17 NOTE — Assessment & Plan Note (Signed)
Sugars as outlined.  Follow met b and a1c.  Diabetic diet.

## 2015-03-17 NOTE — Assessment & Plan Note (Signed)
Persistent pain s/p injury.  Check xray.  May need ortho referral.  Laced ankle support.

## 2015-03-17 NOTE — Assessment & Plan Note (Signed)
Appears to be handling stress relatively well.  Follow.

## 2015-03-17 NOTE — Assessment & Plan Note (Signed)
Sees Dr Evalee Mutton.  Flares intermittently.  Stable.  Follow.

## 2015-03-17 NOTE — Assessment & Plan Note (Signed)
Symptoms controlled

## 2015-03-17 NOTE — Assessment & Plan Note (Signed)
Low cholesterol diet and exercise.  Follow lipid panel.   

## 2015-03-17 NOTE — Assessment & Plan Note (Signed)
Physical 05/31/14.  S/p hysterectomy.  Mammogram 12/12/14 - birads I.

## 2015-03-17 NOTE — Assessment & Plan Note (Signed)
Persistent pain and limited rom with abduction as outlined.  S/p fall/injury. Check xray.  May need ortho referral.

## 2015-03-17 NOTE — Assessment & Plan Note (Signed)
Last bone density - osteopenia - no significant change.  Continue calcium and vitamin D.

## 2015-03-17 NOTE — Assessment & Plan Note (Signed)
Blood pressure has been doing well.  Same medication regimen.  Follow pressures.  Follow metabolic panel.   

## 2015-03-17 NOTE — Assessment & Plan Note (Signed)
Is s/p abdominal ultrasound.  Discussed with Dr Redmond Pulling.  Follow liver panel.  Diagnosed with fatty liver.

## 2015-03-17 NOTE — Assessment & Plan Note (Signed)
Colonoscopy 02/08/13 as outlined.

## 2015-03-17 NOTE — Assessment & Plan Note (Signed)
Follow cbc.  

## 2015-03-19 NOTE — Telephone Encounter (Signed)
Order placed for podiatry referral.  Pt notified via my chart.  ?

## 2015-03-19 NOTE — Addendum Note (Signed)
Addended by: Alisa Graff on: 03/19/2015 01:05 PM   Modules accepted: Orders

## 2015-04-26 ENCOUNTER — Other Ambulatory Visit (INDEPENDENT_AMBULATORY_CARE_PROVIDER_SITE_OTHER): Payer: PPO

## 2015-04-26 DIAGNOSIS — M8580 Other specified disorders of bone density and structure, unspecified site: Secondary | ICD-10-CM | POA: Diagnosis not present

## 2015-04-26 DIAGNOSIS — E119 Type 2 diabetes mellitus without complications: Secondary | ICD-10-CM | POA: Diagnosis not present

## 2015-04-26 DIAGNOSIS — E7800 Pure hypercholesterolemia, unspecified: Secondary | ICD-10-CM

## 2015-04-26 DIAGNOSIS — I10 Essential (primary) hypertension: Secondary | ICD-10-CM | POA: Diagnosis not present

## 2015-04-26 DIAGNOSIS — R7989 Other specified abnormal findings of blood chemistry: Secondary | ICD-10-CM

## 2015-04-26 DIAGNOSIS — R945 Abnormal results of liver function studies: Secondary | ICD-10-CM

## 2015-04-26 LAB — BASIC METABOLIC PANEL
BUN: 14 mg/dL (ref 6–23)
CALCIUM: 8.7 mg/dL (ref 8.4–10.5)
CO2: 29 mEq/L (ref 19–32)
CREATININE: 1.05 mg/dL (ref 0.40–1.20)
Chloride: 103 mEq/L (ref 96–112)
GFR: 56.04 mL/min — AB (ref >60.00)
Glucose, Bld: 124 mg/dL — ABNORMAL HIGH (ref 70–99)
Potassium: 3.5 mEq/L (ref 3.5–5.1)
SODIUM: 139 meq/L (ref 135–145)

## 2015-04-26 LAB — HEPATIC FUNCTION PANEL
ALT: 14 U/L (ref 0–35)
AST: 16 U/L (ref 0–37)
Albumin: 3.7 g/dL (ref 3.5–5.2)
Alkaline Phosphatase: 72 U/L (ref 39–117)
BILIRUBIN DIRECT: 0.1 mg/dL (ref 0.0–0.3)
TOTAL PROTEIN: 6.1 g/dL (ref 6.0–8.3)
Total Bilirubin: 0.4 mg/dL (ref 0.2–1.2)

## 2015-04-26 LAB — LIPID PANEL
Cholesterol: 231 mg/dL — ABNORMAL HIGH (ref 0–200)
HDL: 48.4 mg/dL (ref >39.00)
NonHDL: 182.6
Total CHOL/HDL Ratio: 5
Triglycerides: 300 mg/dL — ABNORMAL HIGH (ref 0.0–149.0)
VLDL: 60 mg/dL — ABNORMAL HIGH (ref 0.0–40.0)

## 2015-04-26 LAB — VITAMIN D 25 HYDROXY (VIT D DEFICIENCY, FRACTURES): VITD: 12.28 ng/mL — ABNORMAL LOW (ref 30.00–100.00)

## 2015-04-26 LAB — LDL CHOLESTEROL, DIRECT: Direct LDL: 115 mg/dL

## 2015-04-26 LAB — HEMOGLOBIN A1C: Hgb A1c MFr Bld: 6.4 % (ref 4.6–6.5)

## 2015-04-29 ENCOUNTER — Other Ambulatory Visit: Payer: Self-pay | Admitting: Internal Medicine

## 2015-04-29 ENCOUNTER — Encounter: Payer: Self-pay | Admitting: Internal Medicine

## 2015-04-29 MED ORDER — VITAMIN D (ERGOCALCIFEROL) 1.25 MG (50000 UNIT) PO CAPS: 50000.0000 [IU] | ORAL_CAPSULE | ORAL | Status: DC

## 2015-04-29 NOTE — Progress Notes (Signed)
rx sent in for ergocalciferol 50,000 units q week #4 with one refill.

## 2015-04-30 ENCOUNTER — Encounter: Payer: Self-pay | Admitting: Internal Medicine

## 2015-04-30 ENCOUNTER — Ambulatory Visit (INDEPENDENT_AMBULATORY_CARE_PROVIDER_SITE_OTHER): Payer: PPO | Admitting: Internal Medicine

## 2015-04-30 VITALS — BP 110/78 | HR 86 | Temp 98.3°F | Ht 64.0 in | Wt 170.4 lb

## 2015-04-30 DIAGNOSIS — Z86010 Personal history of colonic polyps: Secondary | ICD-10-CM

## 2015-04-30 DIAGNOSIS — K589 Irritable bowel syndrome without diarrhea: Secondary | ICD-10-CM

## 2015-04-30 DIAGNOSIS — I10 Essential (primary) hypertension: Secondary | ICD-10-CM | POA: Diagnosis not present

## 2015-04-30 DIAGNOSIS — D649 Anemia, unspecified: Secondary | ICD-10-CM

## 2015-04-30 DIAGNOSIS — F439 Reaction to severe stress, unspecified: Secondary | ICD-10-CM

## 2015-04-30 DIAGNOSIS — E119 Type 2 diabetes mellitus without complications: Secondary | ICD-10-CM

## 2015-04-30 DIAGNOSIS — S82899D Other fracture of unspecified lower leg, subsequent encounter for closed fracture with routine healing: Secondary | ICD-10-CM

## 2015-04-30 DIAGNOSIS — Z0000 Encounter for general adult medical examination without abnormal findings: Secondary | ICD-10-CM

## 2015-04-30 DIAGNOSIS — K219 Gastro-esophageal reflux disease without esophagitis: Secondary | ICD-10-CM

## 2015-04-30 DIAGNOSIS — R945 Abnormal results of liver function studies: Secondary | ICD-10-CM

## 2015-04-30 DIAGNOSIS — R7989 Other specified abnormal findings of blood chemistry: Secondary | ICD-10-CM

## 2015-04-30 DIAGNOSIS — Z658 Other specified problems related to psychosocial circumstances: Secondary | ICD-10-CM

## 2015-04-30 DIAGNOSIS — E7800 Pure hypercholesterolemia, unspecified: Secondary | ICD-10-CM

## 2015-04-30 DIAGNOSIS — J209 Acute bronchitis, unspecified: Secondary | ICD-10-CM

## 2015-04-30 MED ORDER — PREDNISONE 10 MG PO TABS: ORAL_TABLET | ORAL | Status: DC

## 2015-04-30 MED ORDER — ALBUTEROL SULFATE HFA 108 (90 BASE) MCG/ACT IN AERS: 2.0000 | INHALATION_SPRAY | Freq: Four times a day (QID) | RESPIRATORY_TRACT | Status: DC | PRN

## 2015-04-30 MED ORDER — FLUTICASONE PROPIONATE HFA 110 MCG/ACT IN AERO: 2.0000 | INHALATION_SPRAY | Freq: Two times a day (BID) | RESPIRATORY_TRACT | Status: DC

## 2015-04-30 MED ORDER — AMOXICILLIN-POT CLAVULANATE 875-125 MG PO TABS: 1.0000 | ORAL_TABLET | Freq: Two times a day (BID) | ORAL | Status: DC

## 2015-04-30 MED ORDER — ALBUTEROL SULFATE 0.63 MG/3ML IN NEBU: 1.0000 | INHALATION_SOLUTION | Freq: Four times a day (QID) | RESPIRATORY_TRACT | Status: DC | PRN

## 2015-04-30 NOTE — Progress Notes (Signed)
Pre visit review using our clinic review tool, if applicable. No additional management support is needed unless otherwise documented below in the visit note. 

## 2015-04-30 NOTE — Progress Notes (Signed)
Patient ID: Ashley Gross, female   DOB: 03-May-1951, 64 y.o.   MRN: [id number]   Subjective:    Patient ID: Ashley Gross, female    DOB: 08/27/1951, 64 y.o.   MRN: [id number]  HPI  Patient here for a scheduled follow up.  Started having increased head fullness and cough within the last week.  Increased sinus pressure.  Increased chest congestion.  Increased cough.  Will have "coughing fits".  Fever - with tmax 101.4.  Decreased appetite.  No increased sob. Some wheezing.  Has been using her granddaughter's nebulizer.  Husband has been sick as well.   Decreased appetite.  Increased stress with family medical issues and the death of her mother-n-law.  Does not feel she needs any further intervention at this time.  She suffered a recent ankle fracture.  In ankle support now.  Seeing Dr Cleda Mccreedy.  Still some issues with her right shoulder.  Doing exercises.   Saw ortho.  Better.  Discussed diet and exercise.     Past Medical History  Diagnosis Date  . Pure hypercholesterolemia   . Pernicious anemia   . Psoriatic arthritis     s/p penicillamine, plaquenil, MTX, sulfasalazine.  s/p Embrel, Humira,.  Iritis.    . Hypertension   . Anemia, unspecified   . IBS (irritable bowel syndrome)   . Nephrolithiasis   . H/O hiatal hernia   . Colitis   . Hx of arteriovenous malformation (AVM)   . Diabetes mellitus   . Esophagitis     Current Outpatient Prescriptions on File Prior to Visit  Medication Sig Dispense Refill  . amLODipine (NORVASC) 5 MG tablet Take 1 tablet (5 mg total) by mouth 2 (two) times daily. 60 tablet 5  . Calcium Carbonate-Vitamin D (CALTRATE 600+D) 600-400 MG-UNIT per tablet Take 1 tablet by mouth 2 (two) times daily.    . Cholecalciferol (VITAMIN D-3) 1000 UNITS CAPS Take 1 capsule by mouth daily.    . citalopram (CELEXA) 10 MG tablet Take 1 tablet (10 mg total) by mouth daily. 30 tablet 2  . glipiZIDE (GLIPIZIDE XL) 5 MG 24 hr tablet Take 1 tablet (5 mg total) by mouth daily. 90  tablet 1  . glucose blood (ACCU-CHEK COMPACT PLUS) test strip CHECK BLOOD SUGAR ONCE DAILY AS DIRECTED 100 each 5  . ipratropium (ATROVENT HFA) 17 MCG/ACT inhaler Inhale 2 puffs into the lungs every 6 (six) hours.    Marland Kitchen lisinopril (PRINIVIL,ZESTRIL) 40 MG tablet Take 1 tablet (40 mg total) by mouth daily. 30 tablet 5  . metFORMIN (GLUCOPHAGE) 1000 MG tablet Take 1 tablet (1,000 mg total) by mouth 2 (two) times daily with a meal. 60 tablet 5  . omeprazole (PRILOSEC) 20 MG capsule Take 1 capsule (20 mg total) by mouth daily. 30 capsule 11  . Pancrelipase, Lip-Prot-Amyl, 24000 UNITS CPEP Take 1 capsule by mouth 4 (four) times daily.    . pioglitazone (ACTOS) 15 MG tablet Take 1 tablet (15 mg total) by mouth daily. 30 tablet 5  . triamterene-hydrochlorothiazide (MAXZIDE-25) 37.5-25 MG per tablet Take 1 tablet by mouth daily. 30 tablet 5  . Vitamin D, Ergocalciferol, (DRISDOL) 50000 UNITS CAPS capsule Take 1 capsule (50,000 Units total) by mouth every 7 (seven) days. 4 capsule 1   No current facility-administered medications on file prior to visit.    Review of Systems  Constitutional: Positive for fatigue. Negative for unexpected weight change.       Decreased appetite with the current infection.  HENT: Positive for congestion and sinus pressure.   Respiratory: Positive for cough (chest congestion and productive cough. ) and wheezing. Negative for chest tightness and shortness of breath.   Cardiovascular: Negative for chest pain, palpitations and leg swelling.  Gastrointestinal: Positive for diarrhea (chronic). Negative for nausea, vomiting and abdominal pain.  Skin: Negative for color change and rash.  Neurological: Negative for dizziness and light-headedness.  Psychiatric/Behavioral: Negative for dysphoric mood and agitation.       Increased stress       Objective:    Physical Exam  Constitutional: She appears well-developed and well-nourished. No distress.  HENT:  Mouth/Throat:  Oropharynx is clear and moist.  Nares - slightly erythematous turbinates.    Neck: Neck supple. No thyromegaly present.  Cardiovascular: Normal rate and regular rhythm.   Pulmonary/Chest: Breath sounds normal. No respiratory distress.  Increased cough with forced expiration.   Abdominal: Soft. Bowel sounds are normal. There is no tenderness.  Musculoskeletal: She exhibits no edema or tenderness.  Lymphadenopathy:    She has no cervical adenopathy.  Skin: No rash noted. No erythema.  Psychiatric: She has a normal mood and affect. Her behavior is normal.    BP 110/78 mmHg  Pulse 86  Temp(Src) 98.3 F (36.8 C) (Oral)  Ht '5\' 4"'  (1.626 m)  Wt 170 lb 6 oz (77.282 kg)  BMI 29.23 kg/m2  SpO2 97% Wt Readings from Last 3 Encounters:  04/30/15 170 lb 6 oz (77.282 kg)  03/12/15 174 lb 8 oz (79.153 kg)  12/11/14 170 lb (77.111 kg)     Lab Results  Component Value Date   WBC 6.3 12/18/2014   HGB 12.9 12/18/2014   HCT 39.0 12/18/2014   PLT 229.0 12/18/2014   GLUCOSE 124* 04/26/2015   CHOL 231* 04/26/2015   TRIG 300.0* 04/26/2015   HDL 48.40 04/26/2015   LDLDIRECT 115.0 04/26/2015   LDLCALC 128* 01/17/2014   ALT 14 04/26/2015   AST 16 04/26/2015   NA 139 04/26/2015   K 3.5 04/26/2015   CL 103 04/26/2015   CREATININE 1.05 04/26/2015   BUN 14 04/26/2015   CO2 29 04/26/2015   TSH 0.99 01/17/2014   HGBA1C 6.4 04/26/2015   MICROALBUR 1.2 05/28/2014    Dg Shoulder Right  03/12/2015   CLINICAL DATA:  Right shoulder pain, fell on right side 1 week ago  EXAM: RIGHT SHOULDER - 2+ VIEW  COMPARISON:  None.  FINDINGS: Three views of the right shoulder submitted. No acute fracture or subluxation. Moderate degenerative changes AC joint. There is spurring of distal clavicle.  IMPRESSION: No acute fracture or subluxation. Degenerative changes AC joint. There is spurring of distal clavicle.   Electronically Signed   By: Lahoma Crocker M.D.   On: 03/12/2015 15:28   Dg Ankle Complete  Right  03/12/2015   CLINICAL DATA:  Acute right ankle pain and swelling after twisting injury and fall 1 week ago. Initial encounter.  EXAM: RIGHT ANKLE - COMPLETE 3+ VIEW  COMPARISON:  None.  FINDINGS: Minimally displaced fracture is seen involving the lateral malleolus with overlying soft tissue swelling. This appears closed and posttraumatic. Talar dome appears intact. Distal tibia appears normal.  IMPRESSION: Minimally displaced fracture seen involving inferior tip of lateral malleolus.   Electronically Signed   By: Marijo Conception, M.D.   On: 03/12/2015 15:25       Assessment & Plan:   Problem List Items Addressed This Visit    Abnormal liver function test  S/p abdominal ultrasound.  Sees Dr Redmond Pulling.  Last check wnl.       Acute bronchitis    Increased cough and congestion as outlined.  "coughing fits".  Treat with augmentin as directed.  Prednisone taper.  Flovent inhaler and albuterol inhaler as directed.  Continue mucinex.  Follow closely.  Notify me if persistent problems.        Anemia    Follow cbc.       Ankle fracture    Wearing an ankle support.  Seeing Dr Cleda Mccreedy.  Follow up scheduled 05/13/15.        Diabetes mellitus    Sugars have been under good control.  A1c just checked 6.4.  Sees opthalmology.  Follow met b and a1c.       GERD (gastroesophageal reflux disease)    Symptoms controlled.       Health care maintenance    Physical 05/31/14.  S/p hysterectomy.  Mammogram 12/12/14 - Boirads I.       History of colonic polyps    Colonoscopy as outlined.  Sees Dr Redmond Pulling.       Hypercholesterolemia    Low cholesterol diet and exercise.  Follow lipid panel.        Hypertension - Primary    Blood pressure doing well.  Same medication regimen.  Follow pressures.  Follow metabolic panel.       IBS (irritable bowel syndrome)    Sees Dr Evalee Mutton.  Symptoms overall stable.       Stress    Does not feel she needs any further intervention at this point.  Follow.          I spent 25 minutes with the patient and more than 50% of the time was spent in consultation regarding the above.     Einar Pheasant, MD

## 2015-05-02 ENCOUNTER — Encounter: Payer: Self-pay | Admitting: Internal Medicine

## 2015-05-02 ENCOUNTER — Telehealth: Payer: Self-pay | Admitting: *Deleted

## 2015-05-02 NOTE — Assessment & Plan Note (Signed)
Sees Dr Evalee Mutton.  Symptoms overall stable.

## 2015-05-02 NOTE — Assessment & Plan Note (Signed)
Increased cough and congestion as outlined.  "coughing fits".  Treat with augmentin as directed.  Prednisone taper.  Flovent inhaler and albuterol inhaler as directed.  Continue mucinex.  Follow closely.  Notify me if persistent problems.

## 2015-05-02 NOTE — Assessment & Plan Note (Signed)
Colonoscopy as outlined.  Sees Dr Redmond Pulling.

## 2015-05-02 NOTE — Assessment & Plan Note (Signed)
Physical 05/31/14.  S/p hysterectomy.  Mammogram 12/12/14 - [facility] I.

## 2015-05-02 NOTE — Assessment & Plan Note (Signed)
Wearing an ankle support.  Seeing Dr Cleda Mccreedy.  Follow up scheduled 05/13/15.

## 2015-05-02 NOTE — Assessment & Plan Note (Signed)
Blood pressure doing well.  Same medication regimen.  Follow pressures.  Follow metabolic panel.   

## 2015-05-02 NOTE — Telephone Encounter (Signed)
Fax from pharmacy, needing PA for Albuterol nebulizer solution. Started online, pending response.

## 2015-05-02 NOTE — Assessment & Plan Note (Signed)
S/p abdominal ultrasound.  Sees Dr Redmond Pulling.  Last check wnl.

## 2015-05-02 NOTE — Assessment & Plan Note (Signed)
Does not feel she needs any further intervention at this point.  Follow.

## 2015-05-02 NOTE — Assessment & Plan Note (Signed)
Sugars have been under good control.  A1c just checked 6.4.  Sees opthalmology.  Follow met b and a1c.

## 2015-05-02 NOTE — Assessment & Plan Note (Signed)
Low cholesterol diet and exercise.  Follow lipid panel.   

## 2015-05-02 NOTE — Assessment & Plan Note (Signed)
Follow cbc.  

## 2015-05-02 NOTE — Assessment & Plan Note (Signed)
Symptoms controlled

## 2015-06-17 ENCOUNTER — Other Ambulatory Visit: Payer: Self-pay | Admitting: *Deleted

## 2015-06-17 MED ORDER — LISINOPRIL 40 MG PO TABS: 40.0000 mg | ORAL_TABLET | Freq: Every day | ORAL | Status: DC

## 2015-06-17 MED ORDER — PIOGLITAZONE HCL 15 MG PO TABS: 15.0000 mg | ORAL_TABLET | Freq: Every day | ORAL | Status: DC

## 2015-06-17 MED ORDER — TRIAMTERENE-HCTZ 37.5-25 MG PO TABS: 1.0000 | ORAL_TABLET | Freq: Every day | ORAL | Status: DC

## 2015-06-17 MED ORDER — METFORMIN HCL 1000 MG PO TABS: 1000.0000 mg | ORAL_TABLET | Freq: Two times a day (BID) | ORAL | Status: DC

## 2015-06-17 MED ORDER — GLIPIZIDE ER 5 MG PO TB24: 5.0000 mg | ORAL_TABLET | Freq: Every day | ORAL | Status: DC

## 2015-06-17 MED ORDER — CITALOPRAM HYDROBROMIDE 10 MG PO TABS: 10.0000 mg | ORAL_TABLET | Freq: Every day | ORAL | Status: DC

## 2015-06-17 MED ORDER — AMLODIPINE BESYLATE 5 MG PO TABS: 5.0000 mg | ORAL_TABLET | Freq: Two times a day (BID) | ORAL | Status: DC

## 2015-07-15 ENCOUNTER — Ambulatory Visit: Payer: PPO | Admitting: Internal Medicine

## 2015-07-30 ENCOUNTER — Other Ambulatory Visit: Payer: Self-pay | Admitting: Internal Medicine

## 2015-07-30 NOTE — Telephone Encounter (Signed)
Pt wants one touch test strip, but has a accu chek. Can i still refill?

## 2015-07-31 NOTE — Telephone Encounter (Signed)
Ok to refill if this is what she needs.  Ok to refill x 1 year.

## 2015-08-08 ENCOUNTER — Ambulatory Visit (INDEPENDENT_AMBULATORY_CARE_PROVIDER_SITE_OTHER): Payer: PPO | Admitting: Internal Medicine

## 2015-08-08 ENCOUNTER — Encounter: Payer: Self-pay | Admitting: Internal Medicine

## 2015-08-08 VITALS — BP 100/60 | HR 87 | Temp 98.2°F | Ht 64.0 in | Wt 166.2 lb

## 2015-08-08 DIAGNOSIS — R7989 Other specified abnormal findings of blood chemistry: Secondary | ICD-10-CM

## 2015-08-08 DIAGNOSIS — E119 Type 2 diabetes mellitus without complications: Secondary | ICD-10-CM | POA: Diagnosis not present

## 2015-08-08 DIAGNOSIS — K219 Gastro-esophageal reflux disease without esophagitis: Secondary | ICD-10-CM

## 2015-08-08 DIAGNOSIS — D649 Anemia, unspecified: Secondary | ICD-10-CM

## 2015-08-08 DIAGNOSIS — E559 Vitamin D deficiency, unspecified: Secondary | ICD-10-CM

## 2015-08-08 DIAGNOSIS — Z658 Other specified problems related to psychosocial circumstances: Secondary | ICD-10-CM

## 2015-08-08 DIAGNOSIS — R945 Abnormal results of liver function studies: Secondary | ICD-10-CM

## 2015-08-08 DIAGNOSIS — I10 Essential (primary) hypertension: Secondary | ICD-10-CM

## 2015-08-08 DIAGNOSIS — F439 Reaction to severe stress, unspecified: Secondary | ICD-10-CM

## 2015-08-08 DIAGNOSIS — Z86010 Personal history of colonic polyps: Secondary | ICD-10-CM

## 2015-08-08 DIAGNOSIS — K589 Irritable bowel syndrome without diarrhea: Secondary | ICD-10-CM | POA: Diagnosis not present

## 2015-08-08 DIAGNOSIS — E7800 Pure hypercholesterolemia, unspecified: Secondary | ICD-10-CM

## 2015-08-08 NOTE — Progress Notes (Signed)
Pre-visit discussion using our clinic review tool. No additional management support is needed unless otherwise documented below in the visit note.  

## 2015-08-08 NOTE — Progress Notes (Signed)
Patient ID: Ashley Gross, female   DOB: 1951-02-09, 64 y.o.   MRN: [id number]   Subjective:    Patient ID: Ashley Gross, female    DOB: Jul 30, 1951, 64 y.o.   MRN: [id number]  HPI  Patient with past history of hypertension, GERD, diabetes and hypercholesterolemia.  She comes in today to follow up on these issues.  She has been under increased stress recently.  Dealing with her husband's medical issues.  her father is in Hospice.  Sister passed away.  Also, they are moving into a new house.  She is trying to get things packed and trying to finalize things for closing.  Increased stress related to this.   She feels she is handling things relatively well.  Breathing overall stable.  No increased cough or congestion.  Uses inhalers.  Nebulizer used prn.  No chest pain or tightness.  No abdominal pain.  She does report persistent problems with her bowels.  Persistent loose stool.  She has been seeing Dr Evalee Mutton.  States needs to change to GI within [place] (secondary to insurance).  No blood.  She is eating and drinking.     Past Medical History  Diagnosis Date  . Pure hypercholesterolemia   . Pernicious anemia   . Psoriatic arthritis (HCC)     s/p penicillamine, plaquenil, MTX, sulfasalazine.  s/p Embrel, Humira,.  Iritis.    . Hypertension   . Anemia, unspecified   . IBS (irritable bowel syndrome)   . Nephrolithiasis   . H/O hiatal hernia   . Colitis   . Hx of arteriovenous malformation (AVM)   . Diabetes mellitus ([place])   . Esophagitis    Past Surgical History  Procedure Laterality Date  . Cholecystectomy  1989  . Heel spur surgery    . Umbilical hernia repair    . Bunionectomy    . Hand surgery      right  . Ankle surgery      right  . Nose surgery      x2  . Abdominal hysterectomy  1981    ovaries left in place   Family History  Problem Relation Age of Onset  . Diabetes Other   . Hypertension Other   . Breast cancer Neg Hx   . Colon cancer Neg Hx    Social  History   Social History  . Marital Status: Married    Spouse Name: N/A  . Number of Children: 2  . Years of Education: N/A   Social History Main Topics  . Smoking status: Never Smoker   . Smokeless tobacco: Never Used  . Alcohol Use: No  . Drug Use: No  . Sexual Activity: Not Asked   Other Topics Concern  . None   Social History Narrative   She is married and has two children    Outpatient Encounter Prescriptions as of 08/08/2015  Medication Sig  . albuterol (ACCUNEB) 0.63 MG/3ML nebulizer solution Take 3 mLs (0.63 mg total) by nebulization every 6 (six) hours as needed for wheezing.  Marland Kitchen albuterol (PROVENTIL HFA;VENTOLIN HFA) 108 (90 BASE) MCG/ACT inhaler Inhale 2 puffs into the lungs every 6 (six) hours as needed.  Marland Kitchen amLODipine (NORVASC) 5 MG tablet Take 1 tablet (5 mg total) by mouth 2 (two) times daily.  . Calcium Carbonate-Vitamin D (CALTRATE 600+D) 600-400 MG-UNIT per tablet Take 1 tablet by mouth 2 (two) times daily.  . Cholecalciferol (VITAMIN D-3) 1000 UNITS CAPS Take 1 capsule by mouth daily.  Marland Kitchen  citalopram (CELEXA) 10 MG tablet Take 1 tablet (10 mg total) by mouth daily.  . fluticasone (FLOVENT HFA) 110 MCG/ACT inhaler Inhale 2 puffs into the lungs 2 (two) times daily.  Marland Kitchen glipiZIDE (GLIPIZIDE XL) 5 MG 24 hr tablet Take 1 tablet (5 mg total) by mouth daily.  Marland Kitchen ipratropium (ATROVENT HFA) 17 MCG/ACT inhaler Inhale 2 puffs into the lungs every 6 (six) hours.  Marland Kitchen lisinopril (PRINIVIL,ZESTRIL) 40 MG tablet Take 1 tablet (40 mg total) by mouth daily.  . metFORMIN (GLUCOPHAGE) 1000 MG tablet Take 1 tablet (1,000 mg total) by mouth 2 (two) times daily with a meal.  . omeprazole (PRILOSEC) 20 MG capsule Take 1 capsule (20 mg total) by mouth daily.  . ONE TOUCH ULTRA TEST test strip PATIENT NEEDS NEW METER STRIPS AND LANCETS FOR ONE TOUCH. HER INSURANCE NO LONGER COVERS ACCUCHEK.  . Pancrelipase, Lip-Prot-Amyl, 24000 UNITS CPEP Take 1 capsule by mouth 4 (four) times daily.  .  pioglitazone (ACTOS) 15 MG tablet Take 1 tablet (15 mg total) by mouth daily.  Marland Kitchen triamterene-hydrochlorothiazide (MAXZIDE-25) 37.5-25 MG per tablet Take 1 tablet by mouth daily.  . Vitamin D, Ergocalciferol, (DRISDOL) 50000 UNITS CAPS capsule Take 1 capsule (50,000 Units total) by mouth every 7 (seven) days.  . [DISCONTINUED] amoxicillin-clavulanate (AUGMENTIN) 875-125 MG per tablet Take 1 tablet by mouth 2 (two) times daily.  . [DISCONTINUED] predniSONE (DELTASONE) 10 MG tablet Take 6 tablets x 1 day and then decreased by 1/2 tablet per day until down to zero mg.   No facility-administered encounter medications on file as of 08/08/2015.    Review of Systems  Constitutional: Negative for appetite change.       Has lost some weight.  Has adjusted her diet.    HENT: Negative for congestion and sinus pressure.   Eyes: Negative for discharge and visual disturbance.  Respiratory: Negative for cough, chest tightness and shortness of breath.   Cardiovascular: Negative for chest pain, palpitations and leg swelling.  Gastrointestinal: Positive for diarrhea. Negative for nausea, vomiting and abdominal pain.  Genitourinary: Negative for dysuria and difficulty urinating.  Musculoskeletal: Negative for back pain and joint swelling.  Skin: Negative for color change and rash.  Neurological: Negative for dizziness, light-headedness and headaches.  Psychiatric/Behavioral: Negative for dysphoric mood and agitation.       Objective:     Blood pressure rechecked by me:  118/68  Physical Exam  Constitutional: She appears well-developed and well-nourished. No distress.  HENT:  Nose: Nose normal.  Mouth/Throat: Oropharynx is clear and moist.  Eyes: Conjunctivae are normal. Right eye exhibits no discharge. Left eye exhibits no discharge.  Neck: Neck supple. No thyromegaly present.  Cardiovascular: Normal rate and regular rhythm.   Pulmonary/Chest: Breath sounds normal. No respiratory distress. She has no  wheezes.  Abdominal: Soft. Bowel sounds are normal. There is no tenderness.  Musculoskeletal: She exhibits no edema or tenderness.  Lymphadenopathy:    She has no cervical adenopathy.  Skin: No rash noted. No erythema.  Psychiatric: She has a normal mood and affect. Her behavior is normal.    BP 100/60 mmHg  Pulse 87  Temp(Src) 98.2 F (36.8 C) (Oral)  Ht '5\' 4"'  (1.626 m)  Wt 166 lb 4 oz (75.411 kg)  BMI 28.52 kg/m2  SpO2 97% Wt Readings from Last 3 Encounters:  08/08/15 166 lb 4 oz (75.411 kg)  04/30/15 170 lb 6 oz (77.282 kg)  03/12/15 174 lb 8 oz (79.153 kg)     Lab  Results  Component Value Date   WBC 6.3 12/18/2014   HGB 12.9 12/18/2014   HCT 39.0 12/18/2014   PLT 229.0 12/18/2014   GLUCOSE 124* 04/26/2015   CHOL 231* 04/26/2015   TRIG 300.0* 04/26/2015   HDL 48.40 04/26/2015   LDLDIRECT 115.0 04/26/2015   LDLCALC 128* 01/17/2014   ALT 14 04/26/2015   AST 16 04/26/2015   NA 139 04/26/2015   K 3.5 04/26/2015   CL 103 04/26/2015   CREATININE 1.05 04/26/2015   BUN 14 04/26/2015   CO2 29 04/26/2015   TSH 0.99 01/17/2014   HGBA1C 6.4 04/26/2015   MICROALBUR 1.2 05/28/2014       Assessment & Plan:   Problem List Items Addressed This Visit    Abnormal liver function test    Previous abdominal ultrasound.  Had been followed by Dr Redmond Pulling.  Recent liver panel wnl.        Relevant Orders   Hepatic function panel   Anemia    Hgb 12/2014 - 12.9.  Follow.       Relevant Orders   CBC with Differential/Platelet   Diabetes mellitus ([place]) - Primary    Sugars have been under good control.  She did not bring in any sugar readings.  She has adjusted her diet.  Check sugars.  Follow met b and a1c.  Up to date with eye exams.        Relevant Orders   Hemoglobin A1c   Microalbumin / creatinine urine ratio   GERD (gastroesophageal reflux disease)    Upper symptoms controlled.        History of colonic polyps    02/08/13 colonoscopy - as outlined in overview.  Has  been followed by Dr Evalee Mutton.  Request referral to [facility].  Follow.        Hypercholesterolemia    Low cholesterol diet and exercise.  Follow lipid panel.        Relevant Orders   Lipid panel   Hypertension    Blood pressure under good control.  Continue same medication regimen.  Follow pressures.  Follow metabolic panel.        Relevant Orders   TSH   Basic metabolic panel   IBS (irritable bowel syndrome)    Has been seeing Dr Evalee Mutton.  Intermittent flares of loose stool.  She request referral to GI in [place].  Has been worked up previously.  Symptoms persistent (possibly worsening) - refer to GI.        Relevant Orders   Ambulatory referral to Gastroenterology   Stress    Increased stress as outlined.  She does not feel she needs any further intervention at this time.  Follow.        Other Visit Diagnoses    Vitamin D deficiency        Relevant Orders    Vit D  25 hydroxy (rtn osteoporosis monitoring)        Einar Pheasant, MD

## 2015-08-09 ENCOUNTER — Encounter: Payer: Self-pay | Admitting: Internal Medicine

## 2015-08-09 ENCOUNTER — Telehealth: Payer: Self-pay | Admitting: *Deleted

## 2015-08-09 NOTE — Assessment & Plan Note (Signed)
Upper symptoms controlled.  

## 2015-08-09 NOTE — Assessment & Plan Note (Signed)
Has been seeing Dr Evalee Mutton.  Intermittent flares of loose stool.  She request referral to GI in [place].  Has been worked up previously.  Symptoms persistent (possibly worsening) - refer to GI.

## 2015-08-09 NOTE — Telephone Encounter (Signed)
I spoke with the insurance company & a PA was not required. They only needed a determination of which plan to file it under. They have already determined that it is to be filled under plan B. The copay will be $6.48 & they pharmacy should be aware that it can be filled. Pt notified of this as well.

## 2015-08-09 NOTE — Assessment & Plan Note (Signed)
Low cholesterol diet and exercise.  Follow lipid panel.   

## 2015-08-09 NOTE — Assessment & Plan Note (Signed)
Sugars have been under good control.  She did not bring in any sugar readings.  She has adjusted her diet.  Check sugars.  Follow met b and a1c.  Up to date with eye exams.

## 2015-08-09 NOTE — Assessment & Plan Note (Signed)
Hgb 12/2014 - 12.9.  Follow.

## 2015-08-09 NOTE — Assessment & Plan Note (Signed)
02/08/13 colonoscopy - as outlined in overview.  Has been followed by Dr Evalee Mutton.  Request referral to [facility].  Follow.

## 2015-08-09 NOTE — Assessment & Plan Note (Signed)
Blood pressure under good control.  Continue same medication regimen.  Follow pressures.  Follow metabolic panel.   

## 2015-08-09 NOTE — Assessment & Plan Note (Signed)
Previous abdominal ultrasound.  Had been followed by Dr Redmond Pulling.  Recent liver panel wnl.

## 2015-08-09 NOTE — Assessment & Plan Note (Signed)
Increased stress as outlined.  She does not feel she needs any further intervention at this time.  Follow.

## 2015-08-09 NOTE — Telephone Encounter (Signed)
-----   Message from Einar Pheasant, MD sent at 08/09/2015  6:19 AM EDT ----- Regarding: prior authorization Pt was inquiring about a prior authorization for her albuterol nebs.  It appears Jacqlyn Larsen started it previously.  Need to find out where we stand and if the process needs to be restarted.  Uses inhalers.  Still needs intermittent nebs.     Dr Nicki Reaper

## 2015-09-10 ENCOUNTER — Other Ambulatory Visit: Payer: PPO

## 2015-09-15 ENCOUNTER — Other Ambulatory Visit: Payer: Self-pay | Admitting: Internal Medicine

## 2015-09-16 ENCOUNTER — Other Ambulatory Visit: Payer: Self-pay | Admitting: Internal Medicine

## 2015-09-16 NOTE — Telephone Encounter (Signed)
Notify pt that given she is on citalopram that omeprazole can interact with this medication.  I would like to change her omeprazole to protonix 40mg  q day.  If agreeable, can send in rx for protonix and then ok to refill citalopram #30 with 2 refills.

## 2015-09-16 NOTE — Telephone Encounter (Signed)
Celexa last refilled 06/17/15 for #30 with 2 refills. Ok to refill this medication?

## 2015-09-17 NOTE — Telephone Encounter (Signed)
Left message for patient to return my call.

## 2015-09-23 ENCOUNTER — Encounter: Payer: Self-pay | Admitting: *Deleted

## 2015-09-23 NOTE — Telephone Encounter (Signed)
LMTCB & sent mychart message 

## 2015-09-25 NOTE — Telephone Encounter (Signed)
Pt states that she barely takes the Citalopram & would rather not change anything right now. She is going to try to stay off of the Citalopram & discuss this further at her next appt.  It looks like Tanya filled the medication prior to [employer] I getting in touch with her.

## 2015-11-18 ENCOUNTER — Ambulatory Visit (INDEPENDENT_AMBULATORY_CARE_PROVIDER_SITE_OTHER): Payer: PPO | Admitting: Internal Medicine

## 2015-11-18 ENCOUNTER — Encounter: Payer: Self-pay | Admitting: Internal Medicine

## 2015-11-18 VITALS — BP 110/70 | HR 97 | Temp 98.0°F | Resp 18 | Ht 63.0 in | Wt 165.0 lb

## 2015-11-18 DIAGNOSIS — Q2733 Arteriovenous malformation of digestive system vessel: Secondary | ICD-10-CM | POA: Diagnosis not present

## 2015-11-18 DIAGNOSIS — R945 Abnormal results of liver function studies: Secondary | ICD-10-CM

## 2015-11-18 DIAGNOSIS — K5520 Angiodysplasia of colon without hemorrhage: Secondary | ICD-10-CM

## 2015-11-18 DIAGNOSIS — K219 Gastro-esophageal reflux disease without esophagitis: Secondary | ICD-10-CM

## 2015-11-18 DIAGNOSIS — F439 Reaction to severe stress, unspecified: Secondary | ICD-10-CM

## 2015-11-18 DIAGNOSIS — Z1239 Encounter for other screening for malignant neoplasm of breast: Secondary | ICD-10-CM

## 2015-11-18 DIAGNOSIS — Z0000 Encounter for general adult medical examination without abnormal findings: Secondary | ICD-10-CM

## 2015-11-18 DIAGNOSIS — I10 Essential (primary) hypertension: Secondary | ICD-10-CM

## 2015-11-18 DIAGNOSIS — M8580 Other specified disorders of bone density and structure, unspecified site: Secondary | ICD-10-CM

## 2015-11-18 DIAGNOSIS — E119 Type 2 diabetes mellitus without complications: Secondary | ICD-10-CM

## 2015-11-18 DIAGNOSIS — Z658 Other specified problems related to psychosocial circumstances: Secondary | ICD-10-CM

## 2015-11-18 DIAGNOSIS — E7800 Pure hypercholesterolemia, unspecified: Secondary | ICD-10-CM

## 2015-11-18 DIAGNOSIS — R7989 Other specified abnormal findings of blood chemistry: Secondary | ICD-10-CM

## 2015-11-18 DIAGNOSIS — K589 Irritable bowel syndrome without diarrhea: Secondary | ICD-10-CM

## 2015-11-18 DIAGNOSIS — Z86010 Personal history of colonic polyps: Secondary | ICD-10-CM

## 2015-11-18 DIAGNOSIS — D649 Anemia, unspecified: Secondary | ICD-10-CM

## 2015-11-18 NOTE — Progress Notes (Signed)
Patient ID: Ashley Gross, female   DOB: August 24, 1951, 65 y.o.   MRN: [id number]   Subjective:    Patient ID: Ashley Gross, female    DOB: 20-Jun-1951, 65 y.o.   MRN: [id number]  HPI  Patient with past history of hypercholesterolemia, PA, diabetes, IBS and hypercholesterolemia.  She comes in today to follow up on these issues as well as for a complete physical exam.   Increased stress recently.  Doing better.  Trying to watch her diet.  No chest pain or tightness.  No increased sob.  Breathing overall stable.  No abdominal pain or cramping.  No acid reflux.  Still with intermittent bowel flares.     Past Medical History  Diagnosis Date  . Pure hypercholesterolemia   . Pernicious anemia   . Psoriatic arthritis (HCC)     s/p penicillamine, plaquenil, MTX, sulfasalazine.  s/p Embrel, Humira,.  Iritis.    . Hypertension   . Anemia, unspecified   . IBS (irritable bowel syndrome)   . Nephrolithiasis   . H/O hiatal hernia   . Colitis   . Hx of arteriovenous malformation (AVM)   . Diabetes mellitus ([place])   . Esophagitis    Past Surgical History  Procedure Laterality Date  . Cholecystectomy  1989  . Heel spur surgery    . Umbilical hernia repair    . Bunionectomy    . Hand surgery      right  . Ankle surgery      right  . Nose surgery      x2  . Abdominal hysterectomy  1981    ovaries left in place   Family History  Problem Relation Age of Onset  . Diabetes Other   . Hypertension Other   . Breast cancer Neg Hx   . Colon cancer Neg Hx    Social History   Social History  . Marital Status: Married    Spouse Name: N/A  . Number of Children: 2  . Years of Education: N/A   Social History Main Topics  . Smoking status: Never Smoker   . Smokeless tobacco: Never Used  . Alcohol Use: No  . Drug Use: No  . Sexual Activity: Not Asked   Other Topics Concern  . None   Social History Narrative   She is married and has two children    Outpatient Encounter Prescriptions  as of 11/18/2015  Medication Sig  . albuterol (ACCUNEB) 0.63 MG/3ML nebulizer solution Take 3 mLs (0.63 mg total) by nebulization every 6 (six) hours as needed for wheezing.  Marland Kitchen albuterol (PROVENTIL HFA;VENTOLIN HFA) 108 (90 BASE) MCG/ACT inhaler Inhale 2 puffs into the lungs every 6 (six) hours as needed.  Marland Kitchen amLODipine (NORVASC) 5 MG tablet Take 1 tablet (5 mg total) by mouth 2 (two) times daily.  . Calcium Carbonate-Vitamin D (CALTRATE 600+D) 600-400 MG-UNIT per tablet Take 1 tablet by mouth 2 (two) times daily.  . Cholecalciferol (VITAMIN D-3) 1000 UNITS CAPS Take 1 capsule by mouth daily.  . citalopram (CELEXA) 10 MG tablet take 1 tablet by mouth once daily  . fluticasone (FLOVENT HFA) 110 MCG/ACT inhaler Inhale 2 puffs into the lungs 2 (two) times daily.  Marland Kitchen glipiZIDE (GLIPIZIDE XL) 5 MG 24 hr tablet Take 1 tablet (5 mg total) by mouth daily.  Marland Kitchen ipratropium (ATROVENT HFA) 17 MCG/ACT inhaler Inhale 2 puffs into the lungs every 6 (six) hours.  Marland Kitchen lisinopril (PRINIVIL,ZESTRIL) 40 MG tablet Take 1 tablet (40 mg total) by  mouth daily.  . metFORMIN (GLUCOPHAGE) 1000 MG tablet Take 1 tablet (1,000 mg total) by mouth 2 (two) times daily with a meal.  . omeprazole (PRILOSEC) 20 MG capsule Take 1 capsule (20 mg total) by mouth daily.  . ONE TOUCH ULTRA TEST test strip PATIENT NEEDS NEW METER STRIPS AND LANCETS FOR ONE TOUCH. HER INSURANCE NO LONGER COVERS ACCUCHEK.  . Pancrelipase, Lip-Prot-Amyl, 24000 UNITS CPEP Take 1 capsule by mouth 4 (four) times daily.  . pioglitazone (ACTOS) 15 MG tablet Take 1 tablet (15 mg total) by mouth daily.  Marland Kitchen triamterene-hydrochlorothiazide (MAXZIDE-25) 37.5-25 MG per tablet Take 1 tablet by mouth daily.  . Vitamin D, Ergocalciferol, (DRISDOL) 50000 UNITS CAPS capsule Take 1 capsule (50,000 Units total) by mouth every 7 (seven) days.   No facility-administered encounter medications on file as of 11/18/2015.    Review of Systems  Constitutional: Negative for appetite  change and unexpected weight change.  HENT: Negative for congestion and sinus pressure.   Eyes: Negative for pain and visual disturbance.  Respiratory: Negative for cough, chest tightness and shortness of breath.   Cardiovascular: Negative for chest pain, palpitations and leg swelling.  Gastrointestinal: Negative for nausea, vomiting and abdominal pain.  Genitourinary: Negative for dysuria and difficulty urinating.  Musculoskeletal: Negative for back pain and joint swelling.  Skin: Negative for color change and rash.  Neurological: Negative for dizziness, light-headedness and headaches.  Hematological: Negative for adenopathy. Does not bruise/bleed easily.  Psychiatric/Behavioral: Negative for dysphoric mood and agitation.       Objective:    Physical Exam  Constitutional: She is oriented to person, place, and time. She appears well-developed and well-nourished. No distress.  HENT:  Nose: Nose normal.  Mouth/Throat: Oropharynx is clear and moist.  Eyes: Right eye exhibits no discharge. Left eye exhibits no discharge. No scleral icterus.  Neck: Neck supple. No thyromegaly present.  Cardiovascular: Normal rate and regular rhythm.   Pulmonary/Chest: Breath sounds normal. No accessory muscle usage. No tachypnea. No respiratory distress. She has no decreased breath sounds. She has no wheezes. She has no rhonchi. Right breast exhibits no inverted nipple, no mass, no nipple discharge and no tenderness (no axillary adenopathy). Left breast exhibits no inverted nipple, no mass, no nipple discharge and no tenderness (no axilarry adenopathy).  Abdominal: Soft. Bowel sounds are normal. There is no tenderness.  Musculoskeletal: She exhibits no edema or tenderness.  Lymphadenopathy:    She has no cervical adenopathy.  Neurological: She is alert and oriented to person, place, and time.  Skin: Skin is warm. No rash noted. No erythema.  Psychiatric: She has a normal mood and affect. Her behavior is  normal.    BP 110/70 mmHg  Pulse 97  Temp(Src) 98 F (36.7 C) (Oral)  Resp 18  Ht '5\' 3"'  (1.6 m)  Wt 165 lb (74.844 kg)  BMI 29.24 kg/m2  SpO2 96% Wt Readings from Last 3 Encounters:  11/18/15 165 lb (74.844 kg)  08/08/15 166 lb 4 oz (75.411 kg)  04/30/15 170 lb 6 oz (77.282 kg)     Lab Results  Component Value Date   WBC 6.3 12/18/2014   HGB 12.9 12/18/2014   HCT 39.0 12/18/2014   PLT 229.0 12/18/2014   GLUCOSE 124* 04/26/2015   CHOL 231* 04/26/2015   TRIG 300.0* 04/26/2015   HDL 48.40 04/26/2015   LDLDIRECT 115.0 04/26/2015   LDLCALC 128* 01/17/2014   ALT 14 04/26/2015   AST 16 04/26/2015   NA 139 04/26/2015  K 3.5 04/26/2015   CL 103 04/26/2015   CREATININE 1.05 04/26/2015   BUN 14 04/26/2015   CO2 29 04/26/2015   TSH 0.99 01/17/2014   HGBA1C 6.4 04/26/2015   MICROALBUR 1.2 05/28/2014    Dg Shoulder Right  03/12/2015  CLINICAL DATA:  Right shoulder pain, fell on right side 1 week ago EXAM: RIGHT SHOULDER - 2+ VIEW COMPARISON:  None. FINDINGS: Three views of the right shoulder submitted. No acute fracture or subluxation. Moderate degenerative changes AC joint. There is spurring of distal clavicle. IMPRESSION: No acute fracture or subluxation. Degenerative changes AC joint. There is spurring of distal clavicle. Electronically Signed   By: Lahoma Crocker M.D.   On: 03/12/2015 15:28   Dg Ankle Complete Right  03/12/2015  CLINICAL DATA:  Acute right ankle pain and swelling after twisting injury and fall 1 week ago. Initial encounter. EXAM: RIGHT ANKLE - COMPLETE 3+ VIEW COMPARISON:  None. FINDINGS: Minimally displaced fracture is seen involving the lateral malleolus with overlying soft tissue swelling. This appears closed and posttraumatic. Talar dome appears intact. Distal tibia appears normal. IMPRESSION: Minimally displaced fracture seen involving inferior tip of lateral malleolus. Electronically Signed   By: Marijo Conception, M.D.   On: 03/12/2015 15:25         Assessment & Plan:   Problem List Items Addressed This Visit    Abnormal liver function test    Had previous abdominal ultrasound.  Evaluated by Dr Redmond Pulling.  Last liver panel wnl.  Follow.        Anemia    Follow cbc.       AVM (arteriovenous malformation) of colon    Stable.  Follow.        Diabetes mellitus ([place])    Sugars have been under good control.  Follow met b and a1c.  Up to date with eye exam.        GERD (gastroesophageal reflux disease)    Upper symptoms controlled.  Follow.        Health care maintenance    Physical today 11/18/15.  S/p hysterectomy.  Mammogram 12/12/14 - Birads I.  Schedule f/u mammogram.  Colonoscopy 2014 as outlined.        History of colonic polyps    02/18/13 colonoscopy as outlined in overview.  Previously followed by Dr Evalee Mutton.  Request referral to [facility].        Hypercholesterolemia    Low cholesterol diet and exercise.  Follow lipid panel.        Hypertension    Blood pressure under good control.  Continue same medication regimen.  Follow pressures.  Follow metabolic panel.        IBS (irritable bowel syndrome)    Has seen Dr Evalee Mutton.  Intermittent flares of loose stool.  She desires to be followed at [facility].  Will schedule to get established with [facility].        Osteopenia    Continue vitamin D and calcium and weight bearing exercise.        Stress    Increased stress as outlined.  Doing better now.  Follow.  Does not feel needs any further intervention.         Other Visit Diagnoses    Screening breast examination    -  Primary    Relevant Orders    MM DIGITAL SCREENING BILATERAL        Einar Pheasant, MD

## 2015-11-18 NOTE — Progress Notes (Signed)
Pre-visit discussion using our clinic review tool. No additional management support is needed unless otherwise documented below in the visit note.  

## 2015-11-24 ENCOUNTER — Encounter: Payer: Self-pay | Admitting: Internal Medicine

## 2015-11-24 NOTE — Assessment & Plan Note (Signed)
02/18/13 colonoscopy as outlined in overview.  Previously followed by Dr Evalee Mutton.  Request referral to [facility].

## 2015-11-24 NOTE — Assessment & Plan Note (Signed)
Sugars have been under good control.  Follow met b and a1c.  Up to date with eye exam.

## 2015-11-24 NOTE — Assessment & Plan Note (Signed)
Stable.  Follow.   

## 2015-11-24 NOTE — Assessment & Plan Note (Signed)
Follow cbc.  

## 2015-11-24 NOTE — Assessment & Plan Note (Signed)
Blood pressure under good control.  Continue same medication regimen.  Follow pressures.  Follow metabolic panel.   

## 2015-11-24 NOTE — Assessment & Plan Note (Signed)
Continue vitamin D and calcium and weight bearing exercise.

## 2015-11-24 NOTE — Assessment & Plan Note (Signed)
Had previous abdominal ultrasound.  Evaluated by Dr Redmond Pulling.  Last liver panel wnl.  Follow.

## 2015-11-24 NOTE — Assessment & Plan Note (Signed)
Increased stress as outlined.  Doing better now.  Follow.  Does not feel needs any further intervention.

## 2015-11-24 NOTE — Assessment & Plan Note (Signed)
Upper symptoms controlled.  Follow.  

## 2015-11-24 NOTE — Assessment & Plan Note (Signed)
Low cholesterol diet and exercise.  Follow lipid panel.   

## 2015-11-24 NOTE — Assessment & Plan Note (Signed)
Physical today 11/18/15.  S/p hysterectomy.  Mammogram 12/12/14 - Birads I.  Schedule f/u mammogram.  Colonoscopy 2014 as outlined.

## 2015-11-24 NOTE — Assessment & Plan Note (Signed)
Has seen Dr Evalee Mutton.  Intermittent flares of loose stool.  She desires to be followed at [facility].  Will schedule to get established with [facility].

## 2015-11-28 ENCOUNTER — Other Ambulatory Visit (INDEPENDENT_AMBULATORY_CARE_PROVIDER_SITE_OTHER): Payer: PPO

## 2015-11-28 DIAGNOSIS — E119 Type 2 diabetes mellitus without complications: Secondary | ICD-10-CM

## 2015-11-28 DIAGNOSIS — I10 Essential (primary) hypertension: Secondary | ICD-10-CM

## 2015-11-28 DIAGNOSIS — R7989 Other specified abnormal findings of blood chemistry: Secondary | ICD-10-CM

## 2015-11-28 DIAGNOSIS — E7800 Pure hypercholesterolemia, unspecified: Secondary | ICD-10-CM | POA: Diagnosis not present

## 2015-11-28 DIAGNOSIS — E559 Vitamin D deficiency, unspecified: Secondary | ICD-10-CM | POA: Diagnosis not present

## 2015-11-28 DIAGNOSIS — D649 Anemia, unspecified: Secondary | ICD-10-CM | POA: Diagnosis not present

## 2015-11-28 DIAGNOSIS — R945 Abnormal results of liver function studies: Secondary | ICD-10-CM

## 2015-11-28 LAB — CBC WITH DIFFERENTIAL/PLATELET
BASOS ABS: 0 10*3/uL (ref 0.0–0.1)
Basophils Relative: 0.8 % (ref 0.0–3.0)
EOS ABS: 0.1 10*3/uL (ref 0.0–0.7)
Eosinophils Relative: 1.5 % (ref 0.0–5.0)
HEMATOCRIT: 39.7 % (ref 36.0–46.0)
HEMOGLOBIN: 12.8 g/dL (ref 12.0–15.0)
LYMPHS PCT: 30.1 % (ref 12.0–46.0)
Lymphs Abs: 1.9 10*3/uL (ref 0.7–4.0)
MCHC: 32.3 g/dL (ref 30.0–36.0)
MCV: 90 fl (ref 78.0–100.0)
MONO ABS: 0.3 10*3/uL (ref 0.1–1.0)
Monocytes Relative: 5.3 % (ref 3.0–12.0)
Neutro Abs: 3.9 10*3/uL (ref 1.4–7.7)
Neutrophils Relative %: 62.3 % (ref 43.0–77.0)
Platelets: 213 10*3/uL (ref 150.0–400.0)
RBC: 4.41 Mil/uL (ref 3.87–5.11)
RDW: 13.9 % (ref 11.5–15.5)
WBC: 6.3 10*3/uL (ref 4.0–10.5)

## 2015-11-28 LAB — HEPATIC FUNCTION PANEL
ALBUMIN: 3.8 g/dL (ref 3.5–5.2)
ALT: 12 U/L (ref 0–35)
AST: 17 U/L (ref 0–37)
Alkaline Phosphatase: 65 U/L (ref 39–117)
BILIRUBIN DIRECT: 0 mg/dL (ref 0.0–0.3)
TOTAL PROTEIN: 6.2 g/dL (ref 6.0–8.3)
Total Bilirubin: 0.5 mg/dL (ref 0.2–1.2)

## 2015-11-28 LAB — BASIC METABOLIC PANEL
BUN: 17 mg/dL (ref 6–23)
CALCIUM: 8.9 mg/dL (ref 8.4–10.5)
CO2: 28 mEq/L (ref 19–32)
Chloride: 103 mEq/L (ref 96–112)
Creatinine, Ser: 0.99 mg/dL (ref 0.40–1.20)
GFR: 59.86 mL/min — AB (ref >60.00)
GLUCOSE: 133 mg/dL — AB (ref 70–99)
Potassium: 3.7 mEq/L (ref 3.5–5.1)
SODIUM: 140 meq/L (ref 135–145)

## 2015-11-28 LAB — VITAMIN D 25 HYDROXY (VIT D DEFICIENCY, FRACTURES): VITD: 16.82 ng/mL — AB (ref 30.00–100.00)

## 2015-11-28 LAB — LIPID PANEL
CHOL/HDL RATIO: 4
CHOLESTEROL: 235 mg/dL — AB (ref 0–200)
HDL: 55.9 mg/dL (ref >39.00)
NonHDL: 179.03
TRIGLYCERIDES: 281 mg/dL — AB (ref 0.0–149.0)
VLDL: 56.2 mg/dL — AB (ref 0.0–40.0)

## 2015-11-28 LAB — MICROALBUMIN / CREATININE URINE RATIO
CREATININE, U: 223.7 mg/dL
Microalb Creat Ratio: 1.3 mg/g (ref 0.0–30.0)
Microalb, Ur: 2.9 mg/dL — ABNORMAL HIGH (ref 0.0–1.9)

## 2015-11-28 LAB — HEMOGLOBIN A1C: Hgb A1c MFr Bld: 6.7 % — ABNORMAL HIGH (ref 4.6–6.5)

## 2015-11-28 LAB — LDL CHOLESTEROL, DIRECT: Direct LDL: 126 mg/dL

## 2015-11-28 LAB — TSH: TSH: 2.1 u[IU]/mL (ref 0.35–4.50)

## 2015-11-29 ENCOUNTER — Other Ambulatory Visit: Payer: Self-pay | Admitting: Internal Medicine

## 2015-12-01 ENCOUNTER — Telehealth: Payer: Self-pay | Admitting: Internal Medicine

## 2015-12-01 MED ORDER — VITAMIN D (ERGOCALCIFEROL) 1.25 MG (50000 UNIT) PO CAPS: 50000.0000 [IU] | ORAL_CAPSULE | ORAL | Status: DC

## 2015-12-01 NOTE — Telephone Encounter (Signed)
Will start ergocalcerifol 50000 units q week x 3 months.  Then restart otc vitamin D3 as outlined.  rx sent if for prescription vitamin D.  Pt notified via my chart.

## 2015-12-04 ENCOUNTER — Other Ambulatory Visit: Payer: Self-pay | Admitting: Internal Medicine

## 2015-12-16 ENCOUNTER — Encounter: Payer: Self-pay | Admitting: Internal Medicine

## 2015-12-16 ENCOUNTER — Other Ambulatory Visit: Payer: Self-pay | Admitting: Internal Medicine

## 2015-12-17 ENCOUNTER — Ambulatory Visit
Admission: RE | Admit: 2015-12-17 | Discharge: 2015-12-17 | Disposition: A | Discharge status: Home / Self Care | Payer: PPO | Source: Ambulatory Visit | Attending: Internal Medicine | Admitting: Internal Medicine

## 2015-12-17 DIAGNOSIS — Z1231 Encounter for screening mammogram for malignant neoplasm of breast: Secondary | ICD-10-CM | POA: Insufficient documentation

## 2015-12-17 DIAGNOSIS — Z1239 Encounter for other screening for malignant neoplasm of breast: Secondary | ICD-10-CM

## 2015-12-18 ENCOUNTER — Other Ambulatory Visit: Payer: Self-pay | Admitting: Internal Medicine

## 2015-12-18 DIAGNOSIS — K589 Irritable bowel syndrome without diarrhea: Secondary | ICD-10-CM

## 2015-12-18 DIAGNOSIS — R195 Other fecal abnormalities: Secondary | ICD-10-CM

## 2015-12-18 NOTE — Progress Notes (Signed)
Order placed for referral - GI

## 2015-12-19 ENCOUNTER — Encounter: Payer: Self-pay | Admitting: Family Medicine

## 2015-12-19 ENCOUNTER — Ambulatory Visit (INDEPENDENT_AMBULATORY_CARE_PROVIDER_SITE_OTHER)
Admission: RE | Admit: 2015-12-19 | Discharge: 2015-12-19 | Disposition: A | Discharge status: Home / Self Care | Payer: PPO | Source: Ambulatory Visit | Attending: Family Medicine | Admitting: Family Medicine

## 2015-12-19 ENCOUNTER — Ambulatory Visit (INDEPENDENT_AMBULATORY_CARE_PROVIDER_SITE_OTHER): Payer: PPO | Admitting: Family Medicine

## 2015-12-19 VITALS — BP 122/72 | HR 94 | Temp 98.6°F | Wt 166.1 lb

## 2015-12-19 DIAGNOSIS — J0100 Acute maxillary sinusitis, unspecified: Secondary | ICD-10-CM | POA: Diagnosis not present

## 2015-12-19 DIAGNOSIS — R059 Cough, unspecified: Secondary | ICD-10-CM

## 2015-12-19 DIAGNOSIS — R05 Cough: Secondary | ICD-10-CM

## 2015-12-19 DIAGNOSIS — J019 Acute sinusitis, unspecified: Secondary | ICD-10-CM | POA: Insufficient documentation

## 2015-12-19 LAB — POCT INFLUENZA A/B
Influenza A, POC: NEGATIVE
Influenza B, POC: NEGATIVE

## 2015-12-19 MED ORDER — GUAIFENESIN-CODEINE 100-10 MG/5ML PO SOLN: 10.0000 mL | Freq: Three times a day (TID) | ORAL | Status: DC | PRN

## 2015-12-19 MED ORDER — PREDNISONE 20 MG PO TABS: 40.0000 mg | ORAL_TABLET | Freq: Every day | ORAL | Status: DC

## 2015-12-19 MED ORDER — ALBUTEROL SULFATE (2.5 MG/3ML) 0.083% IN NEBU
2.5000 mg | INHALATION_SOLUTION | Freq: Once | RESPIRATORY_TRACT | Status: AC
  Administered 2015-12-19: 2.5 mg via RESPIRATORY_TRACT

## 2015-12-19 MED ORDER — AMOXICILLIN-POT CLAVULANATE 875-125 MG PO TABS: 1.0000 | ORAL_TABLET | Freq: Two times a day (BID) | ORAL | Status: DC

## 2015-12-19 NOTE — Assessment & Plan Note (Signed)
Patient's symptoms most consistent with sinusitis, though could have some measure bronchitis. Doubt pneumonia given lack of focal lung findings, though with fever will obtain chest x-ray to rule out pneumonia as an underlying cause of this issue. We'll start on prednisone given wheezes. We will treat with Augmentin. Continue albuterol nebulizer. Given return precautions.

## 2015-12-19 NOTE — Progress Notes (Signed)
Patient ID: Ashley Gross, female   DOB: [birth year], 65 y.o.   MRN: [id number]  Tommi Rumps, MD Phone: [phone]  Ashley Gross is a 65 y.o. female who presents today for same-day visit.  Patient notes a week ago started with rhinorrhea and nasal and sinus congestion. Had a dry cough at that time. Progressively has gotten worse and yesterday felt poorly. Felt achy and had chills. Temperature of 100.29F and then 101.34F. Nonproductive cough. No shortness of breath. Mild gasping for air last night when laying flat, though this improved with sitting up and taking a nebulizer treatment. No chest pain. Mild soreness in her chest with cough. She has been around sick people for the last several months. No shortness of breath at this time.  PMH: nonsmoker.   ROS see history of present illness  Objective  Physical Exam Filed Vitals:   12/19/15 1419  BP: 122/72  Pulse: 94  Temp: 98.6 F (37 C)    BP Readings from Last 3 Encounters:  12/19/15 122/72  11/18/15 110/70  08/08/15 100/60   Wt Readings from Last 3 Encounters:  12/19/15 166 lb 2 oz (75.354 kg)  11/18/15 165 lb (74.844 kg)  08/08/15 166 lb 4 oz (75.411 kg)    Physical Exam  Constitutional: No distress.  HENT:  Head: Normocephalic and atraumatic.  Right Ear: External ear normal.  Left Ear: External ear normal.  Mouth/Throat: Oropharynx is clear and moist. No oropharyngeal exudate.  Eyes: Conjunctivae are normal. Pupils are equal, round, and reactive to light.  Neck: Neck supple.  Cardiovascular: Normal rate, regular rhythm and normal heart sounds.  Exam reveals no gallop and no friction rub.   No murmur heard. Pulmonary/Chest: Effort normal. No respiratory distress. She has wheezes ( mild scattered expiratory wheezes). She has no rales.  Coarse breath sounds throughout  Lymphadenopathy:    She has no cervical adenopathy.  Neurological: She is alert. Gait normal.  Skin: Skin is warm and dry. She is not  diaphoretic.     Assessment/Plan: Please see individual problem list.  Sinusitis, acute, maxillary Patient's symptoms most consistent with sinusitis, though could have some measure bronchitis. Doubt pneumonia given lack of focal lung findings, though with fever will obtain chest x-ray to rule out pneumonia as an underlying cause of this issue. We'll start on prednisone given wheezes. We will treat with Augmentin. Continue albuterol nebulizer. Given return precautions.   cough suppressant provided.  Orders Placed This Encounter  Procedures  . DG Chest 2 View    Standing Status: Future     Number of Occurrences:      Standing Expiration Date: 02/15/2017    Order Specific Question:  Reason for Exam (SYMPTOM  OR DIAGNOSIS REQUIRED)    Answer:  cough, fever    Order Specific Question:  Preferred imaging location?    Answer:  [facility]  . POCT Influenza A/B    Meds ordered this encounter  Medications  . albuterol (PROVENTIL) (2.5 MG/3ML) 0.083% nebulizer solution 2.5 mg    Sig:   . predniSONE (DELTASONE) 20 MG tablet    Sig: Take 2 tablets (40 mg total) by mouth daily with breakfast.    Dispense:  10 tablet    Refill:  0  . amoxicillin-clavulanate (AUGMENTIN) 875-125 MG tablet    Sig: Take 1 tablet by mouth 2 (two) times daily.    Dispense:  14 tablet    Refill:  0  . guaiFENesin-codeine 100-10 MG/5ML syrup    Sig:  Take 10 mLs by mouth 3 (three) times daily as needed for cough.    Dispense:  120 mL    Refill:  0     Tommi Rumps

## 2015-12-19 NOTE — Progress Notes (Signed)
Pre visit review using our clinic review tool, if applicable. No additional management support is needed unless otherwise documented below in the visit note. 

## 2015-12-19 NOTE — Patient Instructions (Signed)
Nice to meet you. You likely have a sinus infection and possible bronchitis. Please go get the chest x-ray to evaluate for pneumonia. We will start her on Augmentin and prednisone to treat this. Please continue to use her albuterol nebulizer every 6 hours as needed for wheezing and shortness of breath. If you develop chest pain, shortness of breath, cough productive of blood, persistent fevers, or any new or changing symptoms please seek medical attention.

## 2016-01-15 DIAGNOSIS — M7750 Other enthesopathy of unspecified foot: Secondary | ICD-10-CM | POA: Diagnosis not present

## 2016-01-15 DIAGNOSIS — Q6689 Other  specified congenital deformities of feet: Secondary | ICD-10-CM | POA: Diagnosis not present

## 2016-02-24 ENCOUNTER — Other Ambulatory Visit: Payer: Self-pay | Admitting: Internal Medicine

## 2016-03-17 ENCOUNTER — Encounter: Payer: Self-pay | Admitting: Internal Medicine

## 2016-03-17 ENCOUNTER — Ambulatory Visit (INDEPENDENT_AMBULATORY_CARE_PROVIDER_SITE_OTHER): Payer: PPO | Admitting: Internal Medicine

## 2016-03-17 VITALS — BP 112/60 | HR 85 | Temp 97.9°F | Resp 18 | Ht 63.0 in | Wt 162.5 lb

## 2016-03-17 DIAGNOSIS — I10 Essential (primary) hypertension: Secondary | ICD-10-CM

## 2016-03-17 DIAGNOSIS — K219 Gastro-esophageal reflux disease without esophagitis: Secondary | ICD-10-CM | POA: Diagnosis not present

## 2016-03-17 DIAGNOSIS — R7989 Other specified abnormal findings of blood chemistry: Secondary | ICD-10-CM

## 2016-03-17 DIAGNOSIS — K589 Irritable bowel syndrome without diarrhea: Secondary | ICD-10-CM

## 2016-03-17 DIAGNOSIS — E119 Type 2 diabetes mellitus without complications: Secondary | ICD-10-CM

## 2016-03-17 DIAGNOSIS — E7800 Pure hypercholesterolemia, unspecified: Secondary | ICD-10-CM

## 2016-03-17 DIAGNOSIS — M545 Low back pain, unspecified: Secondary | ICD-10-CM

## 2016-03-17 DIAGNOSIS — Z658 Other specified problems related to psychosocial circumstances: Secondary | ICD-10-CM

## 2016-03-17 DIAGNOSIS — E559 Vitamin D deficiency, unspecified: Secondary | ICD-10-CM

## 2016-03-17 DIAGNOSIS — F439 Reaction to severe stress, unspecified: Secondary | ICD-10-CM

## 2016-03-17 DIAGNOSIS — R945 Abnormal results of liver function studies: Secondary | ICD-10-CM

## 2016-03-17 MED ORDER — TIZANIDINE HCL 2 MG PO CAPS: 2.0000 mg | ORAL_CAPSULE | Freq: Every evening | ORAL | Status: DC | PRN

## 2016-03-17 NOTE — Progress Notes (Signed)
Patient ID: Ashley Gross, female   DOB: February 13, 1951, 65 y.o.   MRN: [id number]   Subjective:    Patient ID: Ashley Gross, female    DOB: 03-14-1951, 65 y.o.   MRN: [id number]  HPI  Patient here for a scheduled follow up.  She has been under increased stress recently with family medical issues.  Discussed with her today.  She feels she is handling things relatively well.  Does not feel needs anything more at this point.  Breathing stable.  No acid reflux.  Bowels stable.  Does report some right low back discomfort - feels like a pulled muscle.  Has taken flexeril previously and tolerated.     Past Medical History  Diagnosis Date  . Pure hypercholesterolemia   . Pernicious anemia   . Psoriatic arthritis (HCC)     s/p penicillamine, plaquenil, MTX, sulfasalazine.  s/p Embrel, Humira,.  Iritis.    . Hypertension   . Anemia, unspecified   . IBS (irritable bowel syndrome)   . Nephrolithiasis   . H/O hiatal hernia   . Colitis   . Hx of arteriovenous malformation (AVM)   . Diabetes mellitus ([place])   . Esophagitis    Past Surgical History  Procedure Laterality Date  . Cholecystectomy  1989  . Heel spur surgery    . Umbilical hernia repair    . Bunionectomy    . Hand surgery      right  . Ankle surgery      right  . Nose surgery      x2  . Abdominal hysterectomy  1981    ovaries left in place   Family History  Problem Relation Age of Onset  . Diabetes Other   . Hypertension Other   . Breast cancer Neg Hx   . Colon cancer Neg Hx    Social History   Social History  . Marital Status: Married    Spouse Name: N/A  . Number of Children: 2  . Years of Education: N/A   Social History Main Topics  . Smoking status: Never Smoker   . Smokeless tobacco: Never Used  . Alcohol Use: No  . Drug Use: No  . Sexual Activity: Not Asked   Other Topics Concern  . None   Social History Narrative   She is married and has two children    Outpatient Encounter Prescriptions as of  03/17/2016  Medication Sig  . albuterol (ACCUNEB) 0.63 MG/3ML nebulizer solution Take 3 mLs (0.63 mg total) by nebulization every 6 (six) hours as needed for wheezing.  Marland Kitchen albuterol (PROVENTIL HFA;VENTOLIN HFA) 108 (90 BASE) MCG/ACT inhaler Inhale 2 puffs into the lungs every 6 (six) hours as needed.  Marland Kitchen amLODipine (NORVASC) 5 MG tablet take 1 tablet by mouth twice a day  . Calcium Carbonate-Vitamin D (CALTRATE 600+D) 600-400 MG-UNIT per tablet Take 1 tablet by mouth 2 (two) times daily.  . Cholecalciferol (VITAMIN D-3) 1000 UNITS CAPS Take 1 capsule by mouth daily.  . citalopram (CELEXA) 10 MG tablet take 1 tablet by mouth once daily  . fluticasone (FLOVENT HFA) 110 MCG/ACT inhaler Inhale 2 puffs into the lungs 2 (two) times daily.  Marland Kitchen glipiZIDE (GLUCOTROL XL) 5 MG 24 hr tablet take 1 tablet by mouth once daily  . ipratropium (ATROVENT HFA) 17 MCG/ACT inhaler Inhale 2 puffs into the lungs every 6 (six) hours.  Marland Kitchen lisinopril (PRINIVIL,ZESTRIL) 40 MG tablet take 1 tablet by mouth once daily  . metFORMIN (GLUCOPHAGE) 1000 MG  tablet take 1 tablet by mouth twice a day with meals  . omeprazole (PRILOSEC) 20 MG capsule take 1 capsule by mouth once daily  . ONE TOUCH ULTRA TEST test strip PATIENT NEEDS NEW METER STRIPS AND LANCETS FOR ONE TOUCH. HER INSURANCE NO LONGER COVERS ACCUCHEK.  . pioglitazone (ACTOS) 15 MG tablet take 1 tablet by mouth once daily  . triamterene-hydrochlorothiazide (MAXZIDE-25) 37.5-25 MG tablet take 1 tablet by mouth once daily  . [DISCONTINUED] amoxicillin-clavulanate (AUGMENTIN) 875-125 MG tablet Take 1 tablet by mouth 2 (two) times daily.  . [DISCONTINUED] guaiFENesin-codeine 100-10 MG/5ML syrup Take 10 mLs by mouth 3 (three) times daily as needed for cough.  . [DISCONTINUED] Pancrelipase, Lip-Prot-Amyl, 24000 UNITS CPEP Take 1 capsule by mouth 4 (four) times daily.  . [DISCONTINUED] predniSONE (DELTASONE) 20 MG tablet Take 2 tablets (40 mg total) by mouth daily with breakfast.    . [DISCONTINUED] Vitamin D, Ergocalciferol, (DRISDOL) 50000 units CAPS capsule TAKE 1 CAPSULE BY MOUTH EVERY 7 DAYS  . [DISCONTINUED] tizanidine (ZANAFLEX) 2 MG capsule Take 1 capsule (2 mg total) by mouth at bedtime as needed for muscle spasms.   No facility-administered encounter medications on file as of 03/17/2016.    Review of Systems  Constitutional: Negative for appetite change and unexpected weight change.  HENT: Negative for congestion and sinus pressure.   Respiratory: Negative for cough, chest tightness and shortness of breath.   Cardiovascular: Negative for chest pain, palpitations and leg swelling.  Gastrointestinal: Negative for nausea, vomiting and abdominal pain.  Genitourinary: Negative for dysuria and difficulty urinating.  Musculoskeletal: Positive for back pain (right lower back pain. ). Negative for joint swelling.  Neurological: Negative for dizziness, light-headedness and headaches.  Psychiatric/Behavioral: Negative for dysphoric mood and agitation.       Objective:    Physical Exam  Constitutional: She appears well-developed and well-nourished. No distress.  HENT:  Nose: Nose normal.  Mouth/Throat: Oropharynx is clear and moist.  Neck: Neck supple. No thyromegaly present.  Cardiovascular: Normal rate and regular rhythm.   Pulmonary/Chest: Breath sounds normal. No respiratory distress. She has no wheezes.  Abdominal: Soft. Bowel sounds are normal. There is no tenderness.  Musculoskeletal: She exhibits no edema or tenderness.  Lymphadenopathy:    She has no cervical adenopathy.  Skin: No rash noted. No erythema.  Psychiatric: She has a normal mood and affect. Her behavior is normal.    BP 112/60 mmHg  Pulse 85  Temp(Src) 97.9 F (36.6 C) (Oral)  Resp 18  Ht _0  (1.6 m)  Wt 162 lb 8 oz (73.71 kg)  BMI 28.79 kg/m2  SpO2 96% Wt Readings from Last 3 Encounters:  03/17/16 162 lb 8 oz (73.71 kg)  12/19/15 166 lb 2 oz (75.354 kg)  11/18/15 165 lb  (74.844 kg)     Lab Results  Component Value Date   WBC 6.3 11/28/2015   HGB 12.8 11/28/2015   HCT 39.7 11/28/2015   PLT 213.0 11/28/2015   GLUCOSE 133* 11/28/2015   CHOL 235* 11/28/2015   TRIG 281.0* 11/28/2015   HDL 55.90 11/28/2015   LDLDIRECT 126.0 11/28/2015   LDLCALC 128* 01/17/2014   ALT 12 11/28/2015   AST 17 11/28/2015   NA 140 11/28/2015   K 3.7 11/28/2015   CL 103 11/28/2015   CREATININE 0.99 11/28/2015   BUN 17 11/28/2015   CO2 28 11/28/2015   TSH 2.10 11/28/2015   HGBA1C 6.7* 11/28/2015   MICROALBUR 2.9* 11/28/2015    Dg Chest 2  View  12/19/2015  CLINICAL DATA:  65 year old female with cough EXAM: CHEST  2 VIEW COMPARISON:  Prior chest x-ray 01/03/2012 FINDINGS: The lungs are clear and negative for focal airspace consolidation, pulmonary edema or suspicious pulmonary nodule. No pleural effusion or pneumothorax. Cardiac and mediastinal contours are within normal limits. Trace atherosclerotic calcification in the transverse aorta. No acute fracture or lytic or blastic osseous lesions. The visualized upper abdominal bowel gas pattern is unremarkable. Surgical clips in the right upper quadrant suggest prior cholecystectomy. IMPRESSION: No active cardiopulmonary disease. Electronically Signed   By: Jacqulynn Cadet M.D.   On: 12/19/2015 16:15       Assessment & Plan:   Problem List Items Addressed This Visit    Abnormal liver function test - Primary    Had previous abdominal ultrasound.  Evaluated by Dr Redmond Pulling.  Recheck liver panel with next labs.        Relevant Orders   Hepatic function panel   Diabetes mellitus ([place])    Low carb diet and exercise.  Follow met b and a1c.  Sugars have been doing well.        Relevant Orders   Hemoglobin A1c   GERD (gastroesophageal reflux disease)    Upper symptoms controlled.  On prilosec.       Hypercholesterolemia    Low cholesterol diet and exercise.  Follow lipid panel.        Relevant Orders   Lipid panel    Hypertension    Blood pressure under good control.  Continue same medication regimen.  Follow pressures.  Follow metabolic panel.        Relevant Orders   Basic metabolic panel   IBS (irritable bowel syndrome)    Bowels stable.  Sees Dr Redmond Pulling.       Low back pain    Low back pain as outlined.  No radicular symptoms.  Treat with zanaflex.  Follow.  Notify me if persistent.       Stress    Increased stress.  Discussed with her today.  She does not feel needs anything further at this point.  Follow.         Other Visit Diagnoses    Vitamin D deficiency        Relevant Orders    VITAMIN D 25 Hydroxy (Vit-D Deficiency, Fractures)        Einar Pheasant, MD

## 2016-03-17 NOTE — Progress Notes (Signed)
Pre-visit discussion using our clinic review tool. No additional management support is needed unless otherwise documented below in the visit note.  

## 2016-03-23 ENCOUNTER — Telehealth: Payer: Self-pay | Admitting: Internal Medicine

## 2016-03-23 MED ORDER — TIZANIDINE HCL 2 MG PO TABS: 2.0000 mg | ORAL_TABLET | Freq: Every evening | ORAL | Status: DC | PRN

## 2016-03-23 NOTE — Telephone Encounter (Signed)
Kasey from [facility] is calling. She wants the ok to change pt's tizanidine (ZANAFLEX) 2 MG capsule to tablets so pt's insurance covers the cost.

## 2016-03-23 NOTE — Telephone Encounter (Signed)
Tizanidine rx changed to tablets.

## 2016-03-29 ENCOUNTER — Encounter: Payer: Self-pay | Admitting: Internal Medicine

## 2016-03-29 DIAGNOSIS — M545 Low back pain, unspecified: Secondary | ICD-10-CM | POA: Insufficient documentation

## 2016-03-29 NOTE — Assessment & Plan Note (Signed)
Blood pressure under good control.  Continue same medication regimen.  Follow pressures.  Follow metabolic panel.   

## 2016-03-29 NOTE — Assessment & Plan Note (Signed)
Upper symptoms controlled.  On prilosec.   

## 2016-03-29 NOTE — Assessment & Plan Note (Signed)
Low cholesterol diet and exercise.  Follow lipid panel.   

## 2016-03-29 NOTE — Assessment & Plan Note (Signed)
Low back pain as outlined.  No radicular symptoms.  Treat with zanaflex.  Follow.  Notify me if persistent.

## 2016-03-29 NOTE — Assessment & Plan Note (Signed)
Low carb diet and exercise.  Follow met b and a1c.  Sugars have been doing well.

## 2016-03-29 NOTE — Assessment & Plan Note (Signed)
Increased stress.  Discussed with her today.  She does not feel needs anything further at this point.  Follow.

## 2016-03-29 NOTE — Assessment & Plan Note (Signed)
Had previous abdominal ultrasound.  Evaluated by Dr Redmond Pulling.  Recheck liver panel with next labs.

## 2016-03-29 NOTE — Assessment & Plan Note (Signed)
Bowels stable.  Sees Dr Redmond Pulling.

## 2016-04-15 ENCOUNTER — Other Ambulatory Visit (INDEPENDENT_AMBULATORY_CARE_PROVIDER_SITE_OTHER): Payer: PPO

## 2016-04-15 DIAGNOSIS — R7989 Other specified abnormal findings of blood chemistry: Secondary | ICD-10-CM

## 2016-04-15 DIAGNOSIS — E7800 Pure hypercholesterolemia, unspecified: Secondary | ICD-10-CM

## 2016-04-15 DIAGNOSIS — R945 Abnormal results of liver function studies: Secondary | ICD-10-CM

## 2016-04-15 DIAGNOSIS — E559 Vitamin D deficiency, unspecified: Secondary | ICD-10-CM

## 2016-04-15 DIAGNOSIS — E119 Type 2 diabetes mellitus without complications: Secondary | ICD-10-CM | POA: Diagnosis not present

## 2016-04-15 DIAGNOSIS — I10 Essential (primary) hypertension: Secondary | ICD-10-CM | POA: Diagnosis not present

## 2016-04-15 LAB — HEPATIC FUNCTION PANEL
ALT: 18 U/L (ref 0–35)
AST: 17 U/L (ref 0–37)
Albumin: 4 g/dL (ref 3.5–5.2)
Alkaline Phosphatase: 55 U/L (ref 39–117)
BILIRUBIN TOTAL: 0.4 mg/dL (ref 0.2–1.2)
Bilirubin, Direct: 0 mg/dL (ref 0.0–0.3)
Total Protein: 6.4 g/dL (ref 6.0–8.3)

## 2016-04-15 LAB — BASIC METABOLIC PANEL
BUN: 29 mg/dL — AB (ref 6–23)
CALCIUM: 9.1 mg/dL (ref 8.4–10.5)
CO2: 24 mEq/L (ref 19–32)
CREATININE: 1.25 mg/dL — AB (ref 0.40–1.20)
Chloride: 103 mEq/L (ref 96–112)
GFR: 45.68 mL/min — AB (ref >60.00)
Glucose, Bld: 147 mg/dL — ABNORMAL HIGH (ref 70–99)
POTASSIUM: 3.2 meq/L — AB (ref 3.5–5.1)
Sodium: 140 mEq/L (ref 135–145)

## 2016-04-15 LAB — LIPID PANEL
CHOL/HDL RATIO: 5
Cholesterol: 239 mg/dL — ABNORMAL HIGH (ref 0–200)
HDL: 48.1 mg/dL (ref >39.00)
NONHDL: 191.16
Triglycerides: 249 mg/dL — ABNORMAL HIGH (ref 0.0–149.0)
VLDL: 49.8 mg/dL — AB (ref 0.0–40.0)

## 2016-04-15 LAB — VITAMIN D 25 HYDROXY (VIT D DEFICIENCY, FRACTURES): VITD: 24.51 ng/mL — AB (ref 30.00–100.00)

## 2016-04-15 LAB — HEMOGLOBIN A1C: HEMOGLOBIN A1C: 6.1 % (ref 4.6–6.5)

## 2016-04-15 LAB — LDL CHOLESTEROL, DIRECT: Direct LDL: 142 mg/dL

## 2016-04-16 ENCOUNTER — Other Ambulatory Visit: Payer: Self-pay | Admitting: *Deleted

## 2016-04-16 ENCOUNTER — Encounter: Payer: Self-pay | Admitting: *Deleted

## 2016-04-16 MED ORDER — TRIAMTERENE-HCTZ 37.5-25 MG PO TABS
0.5000 | ORAL_TABLET | Freq: Every day | ORAL | Status: DC
Start: 2016-04-16 — End: 2019-02-06

## 2016-04-20 ENCOUNTER — Other Ambulatory Visit: Payer: PPO

## 2016-04-22 ENCOUNTER — Other Ambulatory Visit: Payer: Self-pay | Admitting: Internal Medicine

## 2016-04-22 ENCOUNTER — Other Ambulatory Visit (INDEPENDENT_AMBULATORY_CARE_PROVIDER_SITE_OTHER): Payer: PPO

## 2016-04-22 ENCOUNTER — Telehealth: Payer: Self-pay | Admitting: *Deleted

## 2016-04-22 DIAGNOSIS — R748 Abnormal levels of other serum enzymes: Secondary | ICD-10-CM | POA: Diagnosis not present

## 2016-04-22 DIAGNOSIS — R7989 Other specified abnormal findings of blood chemistry: Secondary | ICD-10-CM

## 2016-04-22 LAB — BASIC METABOLIC PANEL
BUN: 30 mg/dL — ABNORMAL HIGH (ref 6–23)
CALCIUM: 9 mg/dL (ref 8.4–10.5)
CHLORIDE: 104 meq/L (ref 96–112)
CO2: 26 mEq/L (ref 19–32)
CREATININE: 1.24 mg/dL — AB (ref 0.40–1.20)
GFR: 46.11 mL/min — ABNORMAL LOW (ref >60.00)
Glucose, Bld: 126 mg/dL — ABNORMAL HIGH (ref 70–99)
POTASSIUM: 3.5 meq/L (ref 3.5–5.1)
SODIUM: 140 meq/L (ref 135–145)

## 2016-04-22 NOTE — Telephone Encounter (Signed)
Labs and dx?  

## 2016-04-22 NOTE — Telephone Encounter (Signed)
Labs and dX?  

## 2016-04-22 NOTE — Progress Notes (Signed)
Orders placed for f/u met b 

## 2016-04-22 NOTE — Telephone Encounter (Signed)
Order placed for lab 

## 2016-04-23 ENCOUNTER — Encounter: Payer: Self-pay | Admitting: *Deleted

## 2016-05-04 ENCOUNTER — Other Ambulatory Visit (INDEPENDENT_AMBULATORY_CARE_PROVIDER_SITE_OTHER): Payer: PPO

## 2016-05-04 DIAGNOSIS — R748 Abnormal levels of other serum enzymes: Secondary | ICD-10-CM

## 2016-05-04 DIAGNOSIS — R7989 Other specified abnormal findings of blood chemistry: Secondary | ICD-10-CM

## 2016-05-04 LAB — BASIC METABOLIC PANEL
BUN: 15 mg/dL (ref 6–23)
CO2: 26 meq/L (ref 19–32)
Calcium: 8.6 mg/dL (ref 8.4–10.5)
Chloride: 111 mEq/L (ref 96–112)
Creatinine, Ser: 1.01 mg/dL (ref 0.40–1.20)
GFR: 58.42 mL/min — AB (ref >60.00)
GLUCOSE: 145 mg/dL — AB (ref 70–99)
POTASSIUM: 3.2 meq/L — AB (ref 3.5–5.1)
SODIUM: 137 meq/L (ref 135–145)

## 2016-05-06 ENCOUNTER — Encounter: Payer: Self-pay | Admitting: *Deleted

## 2016-05-06 ENCOUNTER — Other Ambulatory Visit: Payer: Self-pay | Admitting: Internal Medicine

## 2016-05-06 DIAGNOSIS — E876 Hypokalemia: Secondary | ICD-10-CM

## 2016-05-06 NOTE — Progress Notes (Signed)
Order placed for f/u potassium.  

## 2016-05-07 ENCOUNTER — Other Ambulatory Visit: Payer: PPO

## 2016-05-29 DIAGNOSIS — Q6689 Other  specified congenital deformities of feet: Secondary | ICD-10-CM | POA: Diagnosis not present

## 2016-05-29 DIAGNOSIS — M7750 Other enthesopathy of unspecified foot: Secondary | ICD-10-CM | POA: Diagnosis not present

## 2016-07-20 ENCOUNTER — Ambulatory Visit: Payer: PPO | Admitting: Internal Medicine

## 2016-08-06 ENCOUNTER — Ambulatory Visit: Payer: PPO | Admitting: Internal Medicine

## 2016-08-18 DIAGNOSIS — Q6689 Other  specified congenital deformities of feet: Secondary | ICD-10-CM | POA: Diagnosis not present

## 2016-08-18 DIAGNOSIS — M7750 Other enthesopathy of unspecified foot: Secondary | ICD-10-CM | POA: Diagnosis not present

## 2016-08-28 ENCOUNTER — Telehealth: Payer: Self-pay | Admitting: Internal Medicine

## 2016-08-28 ENCOUNTER — Encounter: Payer: Self-pay | Admitting: Family Medicine

## 2016-08-28 ENCOUNTER — Ambulatory Visit (INDEPENDENT_AMBULATORY_CARE_PROVIDER_SITE_OTHER): Payer: PPO | Admitting: Family Medicine

## 2016-08-28 ENCOUNTER — Ambulatory Visit
Admission: RE | Admit: 2016-08-28 | Discharge: 2016-08-28 | Disposition: A | Discharge status: Home / Self Care | Payer: PPO | Source: Ambulatory Visit | Attending: Family Medicine | Admitting: Family Medicine

## 2016-08-28 VITALS — BP 128/66 | HR 83 | Temp 97.8°F | Wt 156.2 lb

## 2016-08-28 DIAGNOSIS — R600 Localized edema: Secondary | ICD-10-CM | POA: Diagnosis not present

## 2016-08-28 LAB — CBC
HEMATOCRIT: 33.8 % — AB (ref 36.0–46.0)
HEMOGLOBIN: 11 g/dL — AB (ref 12.0–15.0)
MCHC: 32.7 g/dL (ref 30.0–36.0)
MCV: 89.9 fl (ref 78.0–100.0)
Platelets: 231 10*3/uL (ref 150.0–400.0)
RBC: 3.76 Mil/uL — ABNORMAL LOW (ref 3.87–5.11)
RDW: 14.6 % (ref 11.5–15.5)
WBC: 6.1 10*3/uL (ref 4.0–10.5)

## 2016-08-28 LAB — COMPREHENSIVE METABOLIC PANEL
ALBUMIN: 3.7 g/dL (ref 3.5–5.2)
ALK PHOS: 71 U/L (ref 39–117)
ALT: 9 U/L (ref 0–35)
AST: 15 U/L (ref 0–37)
BUN: 25 mg/dL — AB (ref 6–23)
CALCIUM: 9.1 mg/dL (ref 8.4–10.5)
CO2: 29 mEq/L (ref 19–32)
CREATININE: 1.28 mg/dL — AB (ref 0.40–1.20)
Chloride: 103 mEq/L (ref 96–112)
GFR: 44.4 mL/min — ABNORMAL LOW (ref >60.00)
Glucose, Bld: 118 mg/dL — ABNORMAL HIGH (ref 70–99)
POTASSIUM: 4.3 meq/L (ref 3.5–5.1)
SODIUM: 138 meq/L (ref 135–145)
TOTAL PROTEIN: 5.9 g/dL — AB (ref 6.0–8.3)
Total Bilirubin: 0.4 mg/dL (ref 0.2–1.2)

## 2016-08-28 NOTE — Telephone Encounter (Signed)
[facility] Patient Name: Ashley Gross DOB: [birth year] Initial Comment caller states she has extreme swelling in calves of her legs Nurse Assessment Nurse: Dimas Chyle, RN, Dellis Filbert Date/Time Eilene Ghazi Time): 08/28/2016 8:35:25 AM Confirm and document reason for call. If symptomatic, describe symptoms. You must click the next button to save text entered. ---Caller states she has extreme swelling in calves of her legs. Symptoms started on Sunday. Inside of calves are sore. Bilateral swelling in calves that goes down at night. Has the patient traveled out of the country within the last 30 days? ---No Does the patient have any new or worsening symptoms? ---Yes Will a triage be completed? ---Yes Related visit to physician within the last 2 weeks? ---No Does the PT have any chronic conditions? (i.e. diabetes, asthma, etc.) ---Yes List chronic conditions. ---Diabetes type 2, HTN Is this a behavioral health or substance abuse call? ---No Guidelines Guideline Title Affirmed Question Affirmed Notes Leg Swelling and Edema [1] MODERATE leg swelling (e.g., swelling extends up to knees) AND [2] new onset or worsening Final Disposition User See Physician within 24 Hours Dimas Chyle, RN, [employer] Referrals REFERRED TO PCP OFFICE Disagree/Comply: Leta Baptist

## 2016-08-28 NOTE — Telephone Encounter (Signed)
LM for patient to return call.

## 2016-08-28 NOTE — Telephone Encounter (Signed)
Noted thanks °

## 2016-08-28 NOTE — Telephone Encounter (Signed)
Phone call received from [place] at [facility] ultrasound regarding results for patient.  Results are NEGATIVE for DVT.  Verbal order received from lead physician for patient to go home. Ordering physician made aware.

## 2016-08-28 NOTE — Progress Notes (Signed)
  Tommi Rumps, MD Phone: [phone]  Ashley Gross is a 65 y.o. female who presents today for same day visit.  Bilateral leg swelling: patient notes for the last several days she's had swelling in her bilateral calves. She notes they go down at night though resumes swelling fairly quickly in the morning. She notes they're sore to touch on the inside. She notes some sock indentation. Notes by the end of the day they've swollen so much that feel like they are going to bust. She notes no chest pain, shortness of breath, orthopnea, PND, history of blood clot, recent trips, recent surgeries, recent hospitalizations, history of cancer, and fevers.  PMH: nonsmoker.   ROS see history of present illness  Objective  Physical Exam Vitals:   08/28/16 0947  BP: 128/66  Pulse: 83  Temp: 97.8 F (36.6 C)    BP Readings from Last 3 Encounters:  08/28/16 128/66  03/17/16 112/60  12/19/15 122/72   Wt Readings from Last 3 Encounters:  08/28/16 156 lb 3.2 oz (70.9 kg)  03/17/16 162 lb 8 oz (73.7 kg)  12/19/15 166 lb 2 oz (75.4 kg)    Physical Exam  Constitutional: She is well-developed, well-nourished, and in no distress.  Cardiovascular: Normal rate, regular rhythm and normal heart sounds.   Pulmonary/Chest: Effort normal and breath sounds normal.  Musculoskeletal:  Bilateral calves with mild swelling, right calf 38.5 cm, left calf 38 cm, there are mild venous stasis dermatitis changes bilateral medial calves, there is tenderness over the posterior medial calf bilaterally, there are no cords palpated and the legs are warm and well perfused  Neurological: She is alert. Gait normal.  Skin: Skin is warm and dry.     Assessment/Plan: Please see individual problem list.  Bilateral lower extremity edema New-onset bilateral lower extremity leg swelling. No CHF symptoms. Unlikely to be DVT given bilateral nature though with tenderness and swelling we will obtain an ultrasound to rule  out DVT. We'll obtain lab work to rule out other causes. If workup negative suspect venous insufficiency particularly given dermatitis changes. We will determine management once lab work and ultrasound returns.   Orders Placed This Encounter  Procedures  . US Venous Img Lower Bilateral    Standing Status:   Future    Standing Expiration Date:   10/28/2017    Order Specific Question:   Reason for Exam (SYMPTOM  OR DIAGNOSIS REQUIRED)    Answer:   bilateral calf swelling with tenderness    Order Specific Question:   Preferred imaging location?    Answer:   [facility]  . Comp Met (CMET)  . CBC  . TSH    Tommi Rumps, MD [place]

## 2016-08-28 NOTE — Telephone Encounter (Signed)
Notified patient of US results.

## 2016-08-28 NOTE — Assessment & Plan Note (Signed)
New-onset bilateral lower extremity leg swelling. No CHF symptoms. Unlikely to be DVT given bilateral nature though with tenderness and swelling we will obtain an ultrasound to rule out DVT. We'll obtain lab work to rule out other causes. If workup negative suspect venous insufficiency particularly given dermatitis changes. We will determine management once lab work and ultrasound returns.

## 2016-08-28 NOTE — Patient Instructions (Signed)
Nice to see you. We are going to get some lab work today to evaluate a cause of your swelling. We are also going to get an ultrasound of your legs. If you develop chest pain, shortness of breath, or any or change in symptoms please seek medical attention immediately.

## 2016-08-28 NOTE — Progress Notes (Signed)
Pre visit review using our clinic review tool, if applicable. No additional management support is needed unless otherwise documented below in the visit note. 

## 2016-08-28 NOTE — Telephone Encounter (Signed)
Pt called requesting an apportionment for excessive swelling in both calves. Transferred pt's call to [facility] triage. Thank you!

## 2016-08-28 NOTE — Telephone Encounter (Signed)
Please let the patient know her [place] was negative. See result note for other instructions.

## 2016-08-28 NOTE — Telephone Encounter (Signed)
Patient has appointment with you this morning

## 2016-08-31 LAB — TSH: TSH: 1.59 u[IU]/mL (ref 0.35–4.50)

## 2016-09-01 ENCOUNTER — Telehealth: Payer: Self-pay | Admitting: Internal Medicine

## 2016-09-01 NOTE — Telephone Encounter (Signed)
LM for patient to return call.

## 2016-09-01 NOTE — Telephone Encounter (Signed)
Pt called requesting lab results. Thank you!  Call pt @ [phone] if not home she requested you call her cell [phone]

## 2016-09-16 ENCOUNTER — Other Ambulatory Visit: Payer: Self-pay | Admitting: Internal Medicine

## 2016-09-16 ENCOUNTER — Other Ambulatory Visit (INDEPENDENT_AMBULATORY_CARE_PROVIDER_SITE_OTHER): Payer: PPO

## 2016-09-16 DIAGNOSIS — E876 Hypokalemia: Secondary | ICD-10-CM

## 2016-09-16 DIAGNOSIS — R7989 Other specified abnormal findings of blood chemistry: Secondary | ICD-10-CM

## 2016-09-16 LAB — POTASSIUM: Potassium: 4.3 mEq/L (ref 3.5–5.1)

## 2016-09-16 LAB — BASIC METABOLIC PANEL
BUN: 24 mg/dL — AB (ref 6–23)
CO2: 27 mEq/L (ref 19–32)
CREATININE: 1.24 mg/dL — AB (ref 0.40–1.20)
Calcium: 8.8 mg/dL (ref 8.4–10.5)
Chloride: 103 mEq/L (ref 96–112)
GFR: 46.05 mL/min — AB (ref >60.00)
GLUCOSE: 106 mg/dL — AB (ref 70–99)
Potassium: 4.4 mEq/L (ref 3.5–5.1)
Sodium: 140 mEq/L (ref 135–145)

## 2016-09-16 NOTE — Progress Notes (Signed)
Order placed for f/u met b.  

## 2016-09-22 ENCOUNTER — Ambulatory Visit (INDEPENDENT_AMBULATORY_CARE_PROVIDER_SITE_OTHER): Payer: PPO | Admitting: Internal Medicine

## 2016-09-22 ENCOUNTER — Encounter: Payer: Self-pay | Admitting: Internal Medicine

## 2016-09-22 VITALS — BP 120/62 | HR 84 | Temp 98.4°F | Wt 161.8 lb

## 2016-09-22 DIAGNOSIS — D649 Anemia, unspecified: Secondary | ICD-10-CM

## 2016-09-22 DIAGNOSIS — R945 Abnormal results of liver function studies: Secondary | ICD-10-CM

## 2016-09-22 DIAGNOSIS — E119 Type 2 diabetes mellitus without complications: Secondary | ICD-10-CM

## 2016-09-22 DIAGNOSIS — R7989 Other specified abnormal findings of blood chemistry: Secondary | ICD-10-CM | POA: Diagnosis not present

## 2016-09-22 DIAGNOSIS — K219 Gastro-esophageal reflux disease without esophagitis: Secondary | ICD-10-CM

## 2016-09-22 DIAGNOSIS — I10 Essential (primary) hypertension: Secondary | ICD-10-CM | POA: Diagnosis not present

## 2016-09-22 DIAGNOSIS — R600 Localized edema: Secondary | ICD-10-CM

## 2016-09-22 DIAGNOSIS — J209 Acute bronchitis, unspecified: Secondary | ICD-10-CM

## 2016-09-22 LAB — CBC WITH DIFFERENTIAL/PLATELET
BASOS ABS: 0 10*3/uL (ref 0.0–0.1)
Basophils Relative: 0.9 % (ref 0.0–3.0)
EOS ABS: 0.2 10*3/uL (ref 0.0–0.7)
EOS PCT: 3.1 % (ref 0.0–5.0)
HCT: 31.4 % — ABNORMAL LOW (ref 36.0–46.0)
HEMOGLOBIN: 10.5 g/dL — AB (ref 12.0–15.0)
LYMPHS ABS: 1.5 10*3/uL (ref 0.7–4.0)
Lymphocytes Relative: 28.1 % (ref 12.0–46.0)
MCHC: 33.3 g/dL (ref 30.0–36.0)
MCV: 90.1 fl (ref 78.0–100.0)
MONO ABS: 0.4 10*3/uL (ref 0.1–1.0)
Monocytes Relative: 8.5 % (ref 3.0–12.0)
NEUTROS PCT: 59.4 % (ref 43.0–77.0)
Neutro Abs: 3.1 10*3/uL (ref 1.4–7.7)
Platelets: 209 10*3/uL (ref 150.0–400.0)
RBC: 3.49 Mil/uL — AB (ref 3.87–5.11)
RDW: 14.9 % (ref 11.5–15.5)
WBC: 5.2 10*3/uL (ref 4.0–10.5)

## 2016-09-22 LAB — HEPATIC FUNCTION PANEL
ALBUMIN: 3.4 g/dL — AB (ref 3.5–5.2)
ALT: 10 U/L (ref 0–35)
AST: 17 U/L (ref 0–37)
Alkaline Phosphatase: 75 U/L (ref 39–117)
Bilirubin, Direct: 0 mg/dL (ref 0.0–0.3)
TOTAL PROTEIN: 5.8 g/dL — AB (ref 6.0–8.3)
Total Bilirubin: 0.3 mg/dL (ref 0.2–1.2)

## 2016-09-22 LAB — BASIC METABOLIC PANEL
BUN: 14 mg/dL (ref 6–23)
CALCIUM: 8.6 mg/dL (ref 8.4–10.5)
CHLORIDE: 104 meq/L (ref 96–112)
CO2: 29 meq/L (ref 19–32)
Creatinine, Ser: 1.17 mg/dL (ref 0.40–1.20)
GFR: 49.24 mL/min — ABNORMAL LOW (ref >60.00)
Glucose, Bld: 224 mg/dL — ABNORMAL HIGH (ref 70–99)
Potassium: 4.3 mEq/L (ref 3.5–5.1)
SODIUM: 139 meq/L (ref 135–145)

## 2016-09-22 MED ORDER — PREDNISONE 10 MG PO TABS: ORAL_TABLET | ORAL | 0 refills | Status: DC

## 2016-09-22 MED ORDER — AMOXICILLIN-POT CLAVULANATE 875-125 MG PO TABS
1.0000 | ORAL_TABLET | Freq: Two times a day (BID) | ORAL | 0 refills | Status: DC
Start: 2016-09-22 — End: 2016-10-09

## 2016-09-22 MED ORDER — ALBUTEROL SULFATE 0.63 MG/3ML IN NEBU: 1.0000 | INHALATION_SOLUTION | Freq: Four times a day (QID) | RESPIRATORY_TRACT | 1 refills | Status: DC | PRN

## 2016-09-22 NOTE — Progress Notes (Signed)
Patient ID: Ashley Gross, female   DOB: [birth year], 65 y.o.   MRN: [id number]   Subjective:    Patient ID: Ashley Gross, female    DOB: 1951/09/28, 65 y.o.   MRN: [id number]  HPI  Patient here as a work in with concerns regarding persistent problems with leg swelling and some congestion.  Saw Dr Caryl Bis on 08/28/16.  Had lower extremity swelling.  Lower extremity ultrasound negative for DVT.  She had been taking one whole triam/hctz per day.  Creatinine increased.  Had her hold her medication.  She has been spot checking her blood pressure.  Blood pressure averaging 116-130/60-70s.  She reports she has been working at [employer].  Up on her feet a lot.  Notices swelling worsens after she wakes up in the morning and is worse by the end of the day.  Better in the am.  She also reports recent fever - 101.4 - Tmax.   Low grade fever last night.  Increased head and chest congestion.  Left ear stopped up.  Hears a roaring sound.  Increased sinus congestion.  Productive cough.  No sore throat.  Some wheezing.  No nausea or vomiting.  Bowels stable.  On recent labs hgb decreased slightly.    Past Medical History:  Diagnosis Date  . Anemia, unspecified   . Colitis   . Diabetes mellitus ([place])   . Esophagitis   . H/O hiatal hernia   . Hx of arteriovenous malformation (AVM)   . Hypertension   . IBS (irritable bowel syndrome)   . Nephrolithiasis   . Pernicious anemia   . Psoriatic arthritis (H[facility])    s/p penicillamine, plaquenil, MTX, sulfasalazine.  s/p Embrel, Humira,.  Iritis.    . Pure hypercholesterolemia    Past Surgical History:  Procedure Laterality Date  . ABDOMINAL HYSTERECTOMY  1981   ovaries left in place  . ANKLE SURGERY     right  . BUNIONECTOMY    . CHOLECYSTECTOMY  1989  . HAND SURGERY     right  . HEEL SPUR SURGERY    . NOSE SURGERY     x2  . UMBILICAL HERNIA REPAIR     Family History  Problem Relation Age of Onset  . Diabetes Other   . Hypertension Other   . Breast  cancer Neg Hx   . Colon cancer Neg Hx    Social History   Social History  . Marital status: Married    Spouse name: N/A  . Number of children: 2  . Years of education: N/A   Social History Main Topics  . Smoking status: Never Smoker  . Smokeless tobacco: Never Used  . Alcohol use No  . Drug use: No  . Sexual activity: Not Asked   Other Topics Concern  . None   Social History Narrative   She is married and has two children    Outpatient Encounter Prescriptions as of 09/22/2016  Medication Sig  . albuterol (ACCUNEB) 0.63 MG/3ML nebulizer solution Take 3 mLs (0.63 mg total) by nebulization every 6 (six) hours as needed for wheezing.  Marland Kitchen albuterol (PROVENTIL HFA;VENTOLIN HFA) 108 (90 BASE) MCG/ACT inhaler Inhale 2 puffs into the lungs every 6 (six) hours as needed.  Marland Kitchen amLODipine (NORVASC) 5 MG tablet take 1 tablet by mouth twice a day  . Calcium Carbonate-Vitamin D (CALTRATE 600+D) 600-400 MG-UNIT per tablet Take 1 tablet by mouth 2 (two) times daily.  . Cholecalciferol (VITAMIN D-3) 1000 UNITS CAPS Take 1 capsule  by mouth daily.  . citalopram (CELEXA) 10 MG tablet take 1 tablet by mouth once daily  . fluticasone (FLOVENT HFA) 110 MCG/ACT inhaler Inhale 2 puffs into the lungs 2 (two) times daily.  Marland Kitchen glipiZIDE (GLUCOTROL XL) 5 MG 24 hr tablet take 1 tablet by mouth once daily  . ipratropium (ATROVENT HFA) 17 MCG/ACT inhaler Inhale 2 puffs into the lungs every 6 (six) hours.  Marland Kitchen lisinopril (PRINIVIL,ZESTRIL) 40 MG tablet take 1 tablet by mouth once daily  . metFORMIN (GLUCOPHAGE) 1000 MG tablet take 1 tablet by mouth twice a day with meals  . omeprazole (PRILOSEC) 20 MG capsule take 1 capsule by mouth once daily  . ONE TOUCH ULTRA TEST test strip PATIENT NEEDS NEW METER STRIPS AND LANCETS FOR ONE TOUCH. HER INSURANCE NO LONGER COVERS ACCUCHEK.  . pioglitazone (ACTOS) 15 MG tablet take 1 tablet by mouth once daily  . tiZANidine (ZANAFLEX) 2 MG tablet Take 1 tablet (2 mg total) by  mouth at bedtime as needed for muscle spasms.  Marland Kitchen triamterene-hydrochlorothiazide (MAXZIDE-25) 37.5-25 MG tablet Take 0.5 tablets by mouth daily.  . [DISCONTINUED] albuterol (ACCUNEB) 0.63 MG/3ML nebulizer solution Take 3 mLs (0.63 mg total) by nebulization every 6 (six) hours as needed for wheezing.  Marland Kitchen amoxicillin-clavulanate (AUGMENTIN) 875-125 MG tablet Take 1 tablet by mouth 2 (two) times daily.  . predniSONE (DELTASONE) 10 MG tablet Take 4 tablets x 1 day and then decreased by 1/2 tablet per day until down to zero mg.   No facility-administered encounter medications on file as of 09/22/2016.     Review of Systems  Constitutional: Positive for fever. Negative for appetite change and unexpected weight change.  HENT: Positive for congestion and sinus pressure.        Left ear fullness.   Respiratory: Positive for cough and wheezing. Negative for chest tightness and shortness of breath.   Cardiovascular: Positive for leg swelling. Negative for chest pain and palpitations.  Gastrointestinal: Negative for abdominal pain, nausea and vomiting.  Genitourinary: Negative for difficulty urinating and dysuria.  Musculoskeletal: Negative for joint swelling and myalgias.  Skin:       Some redness - lower extremities.    Neurological: Negative for dizziness, light-headedness and headaches.  Psychiatric/Behavioral: Negative for agitation and dysphoric mood.       Objective:    Physical Exam  Constitutional: She appears well-developed and well-nourished. No distress.  HENT:  Mouth/Throat: Oropharynx is clear and moist.  Increased tenderness to palpation over her sinuses.  Nares - slightly erythematous turbinates.    Neck: Neck supple.  Cardiovascular: Normal rate and regular rhythm.   Pulmonary/Chest: Breath sounds normal. No respiratory distress. She has no wheezes.  Increased cough with forced expiration.   Abdominal: Soft. Bowel sounds are normal. There is no tenderness.  Musculoskeletal:  She exhibits no tenderness.  Pedal and lower extremity edema.   Lymphadenopathy:    She has no cervical adenopathy.  Skin:  Some increased erythema - lower extremity.    Psychiatric: She has a normal mood and affect. Her behavior is normal.    BP 120/62   Pulse 84   Temp 98.4 F (36.9 C) (Oral)   Wt 161 lb 12.8 oz (73.4 kg)   SpO2 96%   BMI 28.66 kg/m  Wt Readings from Last 3 Encounters:  09/22/16 161 lb 12.8 oz (73.4 kg)  08/28/16 156 lb 3.2 oz (70.9 kg)  03/17/16 162 lb 8 oz (73.7 kg)     Lab Results  Component  Value Date   WBC 5.2 09/22/2016   HGB 10.5 (L) 09/22/2016   HCT 31.4 (L) 09/22/2016   PLT 209.0 09/22/2016   GLUCOSE 224 (H) 09/22/2016   CHOL 239 (H) 04/15/2016   TRIG 249.0 (H) 04/15/2016   HDL 48.10 04/15/2016   LDLDIRECT 142.0 04/15/2016   LDLCALC 128 (H) 01/17/2014   ALT 10 09/22/2016   AST 17 09/22/2016   NA 139 09/22/2016   K 4.3 09/22/2016   CL 104 09/22/2016   CREATININE 1.17 09/22/2016   BUN 14 09/22/2016   CO2 29 09/22/2016   TSH 1.59 08/28/2016   HGBA1C 6.1 04/15/2016   MICROALBUR 2.9 (H) 11/28/2015    US Venous Img Lower Bilateral  Result Date: 08/28/2016 CLINICAL DATA:  Bilateral lower extremity edema for 6 days EXAM: BILATERAL LOWER EXTREMITY VENOUS DUPLEX ULTRASOUND TECHNIQUE: Doppler venous assessment of the bilateral lower extremity deep venous system was performed, including characterization of spectral flow, compressibility, and phasicity. COMPARISON:  None. FINDINGS: There is complete compressibility of the bilateral common femoral, femoral, and popliteal veins. Doppler analysis demonstrates respiratory phasicity and augmentation of flow with calf compression. No obvious superficial vein or calf vein thrombosis. IMPRESSION: No evidence of DVT. Electronically Signed   By: Marybelle Killings M.D.   On: 08/28/2016 11:43       Assessment & Plan:   Problem List Items Addressed This Visit    Abnormal liver function test - Primary    Had  previous abdominal ultrasound.  Follow liver panel.        Relevant Orders   Hepatic function panel (Completed)   Acute bronchitis    Increased congestion, cough and fever as outlined.  Increased cough with expiration.  Also with sinus symptoms as well.  Treat with augmentin and prednisone taper as directed.  Saline nasal spray, steroid nasal spray as directed.  Continue inhalers and nebs as directed.  Rest.  Fluids.  Follow closely.        Anemia    Recent cbc with slightly decreased hgb.  Recheck to confirm stable.        Relevant Orders   CBC with Differential/Platelet (Completed)   Bilateral lower extremity edema    Had recent lower extremity ultrasound - negative for DVT.  Worsened recently with increased work and standing.  Is better in the am. Compression hose.  Follow.  If persistent, may get vascular surgery to evaluate.        Diabetes mellitus ([employer])    Sugars per her report have been doing well.  Follow.  Will need to monitor closely while on prednisone.  Stay hydrated.        GERD (gastroesophageal reflux disease)    Controlled on prilosec.        Hypertension    Blood pressure is doing well off triam/hctz.  Remain off.  Recheck metabolic panel today.         Other Visit Diagnoses    Elevated serum creatinine       Relevant Orders   Basic metabolic panel (Completed)       Einar Pheasant, MD

## 2016-09-22 NOTE — Progress Notes (Signed)
Pre visit review using our clinic review tool, if applicable. No additional management support is needed unless otherwise documented below in the visit note. 

## 2016-09-23 ENCOUNTER — Other Ambulatory Visit: Payer: Self-pay | Admitting: Internal Medicine

## 2016-09-23 ENCOUNTER — Other Ambulatory Visit (INDEPENDENT_AMBULATORY_CARE_PROVIDER_SITE_OTHER): Payer: PPO

## 2016-09-23 DIAGNOSIS — D649 Anemia, unspecified: Secondary | ICD-10-CM

## 2016-09-23 LAB — FERRITIN: FERRITIN: 31.7 ng/mL (ref 10.0–291.0)

## 2016-09-23 LAB — VITAMIN B12: VITAMIN B 12: 149 pg/mL — AB (ref 211–911)

## 2016-09-23 LAB — IBC PANEL
IRON: 29 ug/dL — AB (ref 42–145)
Saturation Ratios: 7.3 % — ABNORMAL LOW (ref 20.0–50.0)
TRANSFERRIN: 284 mg/dL (ref 212.0–360.0)

## 2016-09-23 NOTE — Progress Notes (Signed)
Order placed for f/u labs.  

## 2016-09-26 ENCOUNTER — Encounter: Payer: Self-pay | Admitting: Internal Medicine

## 2016-09-26 NOTE — Assessment & Plan Note (Signed)
Blood pressure is doing well off triam/hctz.  Remain off.  Recheck metabolic panel today.

## 2016-09-26 NOTE — Assessment & Plan Note (Signed)
Had recent lower extremity ultrasound - negative for DVT.  Worsened recently with increased work and standing.  Is better in the am. Compression hose.  Follow.  If persistent, may get vascular surgery to evaluate.

## 2016-09-26 NOTE — Assessment & Plan Note (Signed)
Recent cbc with slightly decreased hgb.  Recheck to confirm stable.

## 2016-09-26 NOTE — Assessment & Plan Note (Signed)
Increased congestion, cough and fever as outlined.  Increased cough with expiration.  Also with sinus symptoms as well.  Treat with augmentin and prednisone taper as directed.  Saline nasal spray, steroid nasal spray as directed.  Continue inhalers and nebs as directed.  Rest.  Fluids.  Follow closely.

## 2016-09-26 NOTE — Assessment & Plan Note (Signed)
Controlled on prilosec.   

## 2016-09-26 NOTE — Assessment & Plan Note (Signed)
Sugars per her report have been doing well.  Follow.  Will need to monitor closely while on prednisone.  Stay hydrated.

## 2016-09-26 NOTE — Assessment & Plan Note (Signed)
Had previous abdominal ultrasound.  Follow liver panel.

## 2016-09-28 ENCOUNTER — Other Ambulatory Visit: Payer: Self-pay | Admitting: Internal Medicine

## 2016-09-28 DIAGNOSIS — D649 Anemia, unspecified: Secondary | ICD-10-CM

## 2016-09-28 NOTE — Progress Notes (Signed)
Orders placed for f/u labs.  

## 2016-09-29 ENCOUNTER — Telehealth: Payer: Self-pay

## 2016-09-29 DIAGNOSIS — D509 Iron deficiency anemia, unspecified: Secondary | ICD-10-CM

## 2016-09-29 DIAGNOSIS — R198 Other specified symptoms and signs involving the digestive system and abdomen: Secondary | ICD-10-CM

## 2016-09-29 NOTE — Telephone Encounter (Signed)
-----   Message from Einar Pheasant, MD sent at 09/28/2016  5:28 AM EST ----- Notify pt that her B12 is low.  I would like to start B12 injections.  Start 1038mcg q week x 4 and then 1085mcg q month.  Will follow.  Her iron is also low.  Would like to have her start ferrous sulfate 325mg  q day.  If any problems taking this iron, we can change her to a prescription iron.  Recheck cbc in 3 weeks.  Also given iron deficient anemia, I would like to refer her back to GI for evaluation.  Let me know if agreeable and let me know who she wants to see.  Previously evaluated by Dr Evalee Mutton at [facility].

## 2016-09-29 NOTE — Telephone Encounter (Signed)
Patient notified, lab and nurse visit scheduled. Patient would like to see GI at [facility] if possible after the new year.

## 2016-09-30 NOTE — Telephone Encounter (Signed)
Order placed for GI referral.   

## 2016-10-05 ENCOUNTER — Ambulatory Visit (INDEPENDENT_AMBULATORY_CARE_PROVIDER_SITE_OTHER): Payer: PPO

## 2016-10-05 DIAGNOSIS — E538 Deficiency of other specified B group vitamins: Secondary | ICD-10-CM | POA: Diagnosis not present

## 2016-10-05 MED ORDER — CYANOCOBALAMIN 1000 MCG/ML IJ SOLN
1000.0000 ug | Freq: Once | INTRAMUSCULAR | Status: AC
  Administered 2016-10-05: 1000 ug via INTRAMUSCULAR

## 2016-10-05 NOTE — Progress Notes (Addendum)
Patient comes in for B 12 injection.  Injected left deltoid .  Patient tolerated injection well. Before administering injection patient denied allergy.   In ordering the B 12 injection I noticed where it flagged cobalamine as an allergy with respiratory symptoms at [facility].   I called and spoke with patient she stated she was doing fine and had taken B 12 injections in the past with no problems.     Please call pt and see if she has ever had problems taking B12 in the past.  Reviewed above.    Dr Nicki Reaper

## 2016-10-06 ENCOUNTER — Telehealth: Payer: Self-pay | Admitting: Internal Medicine

## 2016-10-06 NOTE — Progress Notes (Signed)
Patient states that she has taken B 12 injections  in the past you have ordered them per the patient while at [facility].  She states she did fine after injection.

## 2016-10-06 NOTE — Telephone Encounter (Signed)
Left message to return call 

## 2016-10-06 NOTE — Telephone Encounter (Signed)
Duplicate. See other note.

## 2016-10-06 NOTE — Telephone Encounter (Signed)
Pt called back returning your call. Thank you!  Call pt @ [phone]

## 2016-10-06 NOTE — Telephone Encounter (Signed)
If persistent increased symptoms, she needs to be reevaluated.  I can see her Friday pm or can see if someone can see her earlier if needed.  Let me know if a problem.

## 2016-10-06 NOTE — Telephone Encounter (Signed)
Patient is coming in [place] at 55

## 2016-10-06 NOTE — Telephone Encounter (Signed)
Patient states wheezing with activity and at bedtime  , cough is  non productive.   Still using nebulizer as directed please advise.  Taking Nyquil Cold and Cough. Please advise.

## 2016-10-06 NOTE — Telephone Encounter (Signed)
Pt called wanting to let Dr Nicki Reaper know that she has taken all of the medication and still using the nebulizer a lot. Pt is still coughing. Pt is feeling just a little better. Please advise? Pt wants to know if pt needs to be seen again or if something else will be called in?  Call pt @ [phone]. Thank you!

## 2016-10-06 NOTE — Telephone Encounter (Signed)
Please advise 

## 2016-10-09 ENCOUNTER — Encounter: Payer: Self-pay | Admitting: Internal Medicine

## 2016-10-09 ENCOUNTER — Ambulatory Visit (INDEPENDENT_AMBULATORY_CARE_PROVIDER_SITE_OTHER): Payer: PPO

## 2016-10-09 ENCOUNTER — Ambulatory Visit (INDEPENDENT_AMBULATORY_CARE_PROVIDER_SITE_OTHER): Payer: PPO | Admitting: Internal Medicine

## 2016-10-09 VITALS — BP 150/80 | HR 88 | Temp 98.9°F | Ht 63.0 in | Wt 167.6 lb

## 2016-10-09 DIAGNOSIS — I10 Essential (primary) hypertension: Secondary | ICD-10-CM | POA: Diagnosis not present

## 2016-10-09 DIAGNOSIS — R05 Cough: Secondary | ICD-10-CM

## 2016-10-09 DIAGNOSIS — R600 Localized edema: Secondary | ICD-10-CM

## 2016-10-09 DIAGNOSIS — E119 Type 2 diabetes mellitus without complications: Secondary | ICD-10-CM

## 2016-10-09 DIAGNOSIS — R059 Cough, unspecified: Secondary | ICD-10-CM

## 2016-10-09 DIAGNOSIS — R945 Abnormal results of liver function studies: Secondary | ICD-10-CM

## 2016-10-09 DIAGNOSIS — J90 Pleural effusion, not elsewhere classified: Secondary | ICD-10-CM | POA: Diagnosis not present

## 2016-10-09 DIAGNOSIS — J209 Acute bronchitis, unspecified: Secondary | ICD-10-CM

## 2016-10-09 DIAGNOSIS — R7989 Other specified abnormal findings of blood chemistry: Secondary | ICD-10-CM

## 2016-10-09 MED ORDER — GUAIFENESIN-CODEINE 100-10 MG/5ML PO SYRP: 5.0000 mL | ORAL_SOLUTION | Freq: Every evening | ORAL | 0 refills | Status: DC | PRN

## 2016-10-09 MED ORDER — PREDNISONE 10 MG PO TABS: ORAL_TABLET | ORAL | 0 refills | Status: DC

## 2016-10-09 MED ORDER — FLUTICASONE PROPIONATE HFA 110 MCG/ACT IN AERO: 2.0000 | INHALATION_SPRAY | Freq: Two times a day (BID) | RESPIRATORY_TRACT | 1 refills | Status: DC

## 2016-10-09 NOTE — Progress Notes (Signed)
Pre visit review using our clinic review tool, if applicable. No additional management support is needed unless otherwise documented below in the visit note. 

## 2016-10-09 NOTE — Progress Notes (Signed)
Patient ID: Ashley Gross, female   DOB: Aug 21, 1951, 65 y.o.   MRN: [id number]   Subjective:    Patient ID: Ashley Gross, female    DOB: [birth year], 65 y.o.   MRN: [id number]  HPI  Patient here as a work in with concerns regarding persistent cough and congestion.  She was sent recently and treated with prednisone and augmentin.  States may have noticed some slight improvement in her cough.  She still reports increased cough.  Keeping her up at night.  Using nebs regularly.  Sing her inhaler.  Has been taking nyquil.  Not helping.  Did take mucinex last night.  No chest pain.  No fever.  Eating.  No vomiting.  No abdominal pain or cramping.  Bowels stable.  Does report leg swelling persists.  Tried wearing the knee high compression hose.  Feels needs thigh high hose.     Past Medical History:  Diagnosis Date  . Anemia, unspecified   . Colitis   . Diabetes mellitus ([place])   . Esophagitis   . H/O hiatal hernia   . Hx of arteriovenous malformation (AVM)   . Hypertension   . IBS (irritable bowel syndrome)   . Nephrolithiasis   . Pernicious anemia   . Psoriatic arthritis ([facility])    s/p penicillamine, plaquenil, MTX, sulfasalazine.  s/p Embrel, Humira,.  Iritis.    . Pure hypercholesterolemia    Past Surgical History:  Procedure Laterality Date  . ABDOMINAL HYSTERECTOMY  1981   ovaries left in place  . ANKLE SURGERY     right  . BUNIONECTOMY    . CHOLECYSTECTOMY  1989  . HAND SURGERY     right  . HEEL SPUR SURGERY    . NOSE SURGERY     x2  . UMBILICAL HERNIA REPAIR     Family History  Problem Relation Age of Onset  . Diabetes Other   . Hypertension Other   . Breast cancer Neg Hx   . Colon cancer Neg Hx    Social History   Social History  . Marital status: Married    Spouse name: N/A  . Number of children: 2  . Years of education: N/A   Social History Main Topics  . Smoking status: Never Smoker  . Smokeless tobacco: Never Used  . Alcohol use No  . Drug use: No    . Sexual activity: Not Asked   Other Topics Concern  . None   Social History Narrative   She is married and has two children    Outpatient Encounter Prescriptions as of 10/09/2016  Medication Sig  . albuterol (ACCUNEB) 0.63 MG/3ML nebulizer solution Take 3 mLs (0.63 mg total) by nebulization every 6 (six) hours as needed for wheezing.  Marland Kitchen albuterol (PROVENTIL HFA;VENTOLIN HFA) 108 (90 BASE) MCG/ACT inhaler Inhale 2 puffs into the lungs every 6 (six) hours as needed.  Marland Kitchen amLODipine (NORVASC) 5 MG tablet take 1 tablet by mouth twice a day  . Calcium Carbonate-Vitamin D (CALTRATE 600+D) 600-400 MG-UNIT per tablet Take 1 tablet by mouth 2 (two) times daily.  . Cholecalciferol (VITAMIN D-3) 1000 UNITS CAPS Take 1 capsule by mouth daily.  . citalopram (CELEXA) 10 MG tablet take 1 tablet by mouth once daily  . fluticasone (FLOVENT HFA) 110 MCG/ACT inhaler Inhale 2 puffs into the lungs 2 (two) times daily.  Marland Kitchen glipiZIDE (GLUCOTROL XL) 5 MG 24 hr tablet take 1 tablet by mouth once daily  . ipratropium (ATROVENT HFA) 17 MCG/ACT  inhaler Inhale 2 puffs into the lungs every 6 (six) hours.  Marland Kitchen lisinopril (PRINIVIL,ZESTRIL) 40 MG tablet take 1 tablet by mouth once daily  . metFORMIN (GLUCOPHAGE) 1000 MG tablet take 1 tablet by mouth twice a day with meals  . omeprazole (PRILOSEC) 20 MG capsule take 1 capsule by mouth once daily  . ONE TOUCH ULTRA TEST test strip PATIENT NEEDS NEW METER STRIPS AND LANCETS FOR ONE TOUCH. HER INSURANCE NO LONGER COVERS ACCUCHEK.  . pioglitazone (ACTOS) 15 MG tablet take 1 tablet by mouth once daily  . tiZANidine (ZANAFLEX) 2 MG tablet Take 1 tablet (2 mg total) by mouth at bedtime as needed for muscle spasms.  Marland Kitchen triamterene-hydrochlorothiazide (MAXZIDE-25) 37.5-25 MG tablet Take 0.5 tablets by mouth daily.  . [DISCONTINUED] fluticasone (FLOVENT HFA) 110 MCG/ACT inhaler Inhale 2 puffs into the lungs 2 (two) times daily.  . fluticasone (FLOVENT HFA) 110 MCG/ACT inhaler Inhale 2  puffs into the lungs 2 (two) times daily. Rinse mouth after use  . guaiFENesin-codeine (CHERATUSSIN AC) 100-10 MG/5ML syrup Take 5 mLs by mouth at bedtime as needed for cough.  . predniSONE (DELTASONE) 10 MG tablet Take 6 tablets x 1 day and then decreased by 1/2 tablet per day until down to zero mg.  . [DISCONTINUED] amoxicillin-clavulanate (AUGMENTIN) 875-125 MG tablet Take 1 tablet by mouth 2 (two) times daily.  . [DISCONTINUED] predniSONE (DELTASONE) 10 MG tablet Take 4 tablets x 1 day and then decreased by 1/2 tablet per day until down to zero mg.   No facility-administered encounter medications on file as of 10/09/2016.     Review of Systems  Constitutional: Positive for fatigue. Negative for appetite change and unexpected weight change.  HENT: Positive for congestion. Negative for sinus pressure.   Respiratory: Positive for cough and wheezing. Negative for chest tightness and shortness of breath.   Cardiovascular: Positive for leg swelling. Negative for chest pain and palpitations.  Gastrointestinal: Negative for abdominal pain, diarrhea, nausea and vomiting.  Genitourinary: Negative for difficulty urinating and dysuria.  Musculoskeletal: Negative for back pain and joint swelling.  Skin: Negative for color change and rash.  Neurological: Negative for dizziness, light-headedness and headaches.  Psychiatric/Behavioral: Negative for agitation and dysphoric mood.       Objective:    Physical Exam  Constitutional: She appears well-developed and well-nourished. No distress.  HENT:  Nose: Nose normal.  Mouth/Throat: Oropharynx is clear and moist.  Neck: Neck supple. No thyromegaly present.  Cardiovascular: Normal rate and regular rhythm.   Pulmonary/Chest: Breath sounds normal. No respiratory distress.  Increased cough.  Increased cough with expiration.    Abdominal: Soft. Bowel sounds are normal. There is no tenderness.  Musculoskeletal:  Swelling - pedal and bilateral lower  extremity  Lymphadenopathy:    She has no cervical adenopathy.  Skin: No rash noted.  Psychiatric: She has a normal mood and affect. Her behavior is normal.    BP (!) 150/80   Pulse 88   Temp 98.9 F (37.2 C) (Oral)   Ht 5\' 3"  (1.6 m)   Wt 167 lb 9.6 oz (76 kg)   SpO2 98%   BMI 29.69 kg/m  Wt Readings from Last 3 Encounters:  10/09/16 167 lb 9.6 oz (76 kg)  09/22/16 161 lb 12.8 oz (73.4 kg)  08/28/16 156 lb 3.2 oz (70.9 kg)     Lab Results  Component Value Date   WBC 5.2 09/22/2016   HGB 10.5 (L) 09/22/2016   HCT 31.4 (L) 09/22/2016  PLT 209.0 09/22/2016   GLUCOSE 224 (H) 09/22/2016   CHOL 239 (H) 04/15/2016   TRIG 249.0 (H) 04/15/2016   HDL 48.10 04/15/2016   LDLDIRECT 142.0 04/15/2016   LDLCALC 128 (H) 01/17/2014   ALT 10 09/22/2016   AST 17 09/22/2016   NA 139 09/22/2016   K 4.3 09/22/2016   CL 104 09/22/2016   CREATININE 1.17 09/22/2016   BUN 14 09/22/2016   CO2 29 09/22/2016   TSH 1.59 08/28/2016   HGBA1C 6.1 04/15/2016   MICROALBUR 2.9 (H) 11/28/2015    US Venous Img Lower Bilateral  Result Date: 08/28/2016 CLINICAL DATA:  Bilateral lower extremity edema for 6 days EXAM: BILATERAL LOWER EXTREMITY VENOUS DUPLEX ULTRASOUND TECHNIQUE: Doppler venous assessment of the bilateral lower extremity deep venous system was performed, including characterization of spectral flow, compressibility, and phasicity. COMPARISON:  None. FINDINGS: There is complete compressibility of the bilateral common femoral, femoral, and popliteal veins. Doppler analysis demonstrates respiratory phasicity and augmentation of flow with calf compression. No obvious superficial vein or calf vein thrombosis. IMPRESSION: No evidence of DVT. Electronically Signed   By: Marybelle Killings M.D.   On: 08/28/2016 11:43       Assessment & Plan:   Problem List Items Addressed This Visit    Abnormal liver function test    Follow liver panel.        Acute bronchitis    Increased cough and congestion  persist.  Recently treated with abx and prednisone.  Hold on further abx.  Check cxr.  Start prednisone at 60mg  and decrease by 5mg  daily until off.  flovent inhaler and continue albuterol inhaler and nebs.  Continue mucinex.  Follow closely.  May need pulmonary evaluation.        Bilateral lower extremity edema    Had a recent lower extremity ultrasound that was negative for DVT.  Worsened recently with working and with her current illness.  Is on amlodipine and actos.  Has been on these medications for a long time. Not new.  Doubt related.  Will hold on changing.  Treat infection.  Elevate legs.  Thigh high compression hose.  Follow.        Diabetes mellitus ([place])    States sugars are doing well.  Continue to monitor.  Follow sugars.  Follow metabolic panel and [id number].        Elevated serum creatinine    Stopped her triam/hctz.  Renal function improved.  Follow.  Hold on restarting.        Hypertension    Blood pressure has been under good control.  Increased today.  recheck improved - 144/78.  Continue same medication regimen.  Follow pressures.  Follow metabolic panel.          Other Visit Diagnoses    Cough    -  Primary   Relevant Orders   DG Chest 2 View (Completed)       Einar Pheasant, MD

## 2016-10-11 ENCOUNTER — Encounter: Payer: Self-pay | Admitting: Internal Medicine

## 2016-10-11 NOTE — Assessment & Plan Note (Signed)
Blood pressure has been under good control.  Increased today.  recheck improved - 144/78.  Continue same medication regimen.  Follow pressures.  Follow metabolic panel.

## 2016-10-11 NOTE — Assessment & Plan Note (Signed)
Follow liver panel.  

## 2016-10-11 NOTE — Assessment & Plan Note (Signed)
Had a recent lower extremity ultrasound that was negative for DVT.  Worsened recently with working and with her current illness.  Is on amlodipine and actos.  Has been on these medications for a long time. Not new.  Doubt related.  Will hold on changing.  Treat infection.  Elevate legs.  Thigh high compression hose.  Follow.

## 2016-10-11 NOTE — Assessment & Plan Note (Signed)
States sugars are doing well.  Continue to monitor.  Follow sugars.  Follow metabolic panel and A999333.

## 2016-10-11 NOTE — Assessment & Plan Note (Signed)
Increased cough and congestion persist.  Recently treated with abx and prednisone.  Hold on further abx.  Check cxr.  Start prednisone at 60mg  and decrease by 5mg  daily until off.  flovent inhaler and continue albuterol inhaler and nebs.  Continue mucinex.  Follow closely.  May need pulmonary evaluation.

## 2016-10-11 NOTE — Assessment & Plan Note (Signed)
Stopped her triam/hctz.  Renal function improved.  Follow.  Hold on restarting.

## 2016-10-12 ENCOUNTER — Ambulatory Visit (INDEPENDENT_AMBULATORY_CARE_PROVIDER_SITE_OTHER): Payer: PPO

## 2016-10-12 DIAGNOSIS — E538 Deficiency of other specified B group vitamins: Secondary | ICD-10-CM

## 2016-10-12 MED ORDER — CYANOCOBALAMIN 1000 MCG/ML IJ SOLN
1000.0000 ug | Freq: Once | INTRAMUSCULAR | Status: AC
  Administered 2016-10-12: 1000 ug via INTRAMUSCULAR

## 2016-10-12 NOTE — Progress Notes (Addendum)
Patient comes in for B 12 injection.  Injected right deltoid.  Patient tolerated injection well.   Reviewed.  Dr Scott 

## 2016-10-14 ENCOUNTER — Other Ambulatory Visit: Payer: Self-pay | Admitting: Internal Medicine

## 2016-10-14 DIAGNOSIS — R05 Cough: Secondary | ICD-10-CM

## 2016-10-14 DIAGNOSIS — J90 Pleural effusion, not elsewhere classified: Secondary | ICD-10-CM

## 2016-10-14 DIAGNOSIS — R059 Cough, unspecified: Secondary | ICD-10-CM

## 2016-10-14 NOTE — Progress Notes (Signed)
Order placed for pulmonary referral.  

## 2016-10-19 ENCOUNTER — Ambulatory Visit (INDEPENDENT_AMBULATORY_CARE_PROVIDER_SITE_OTHER): Payer: PPO

## 2016-10-19 DIAGNOSIS — E538 Deficiency of other specified B group vitamins: Secondary | ICD-10-CM | POA: Diagnosis not present

## 2016-10-19 MED ORDER — CYANOCOBALAMIN 1000 MCG/ML IJ SOLN
1000.0000 ug | Freq: Once | INTRAMUSCULAR | Status: AC
  Administered 2016-10-19: 1000 ug via INTRAMUSCULAR

## 2016-10-19 NOTE — Progress Notes (Addendum)
Patient comes in for B 12.  Injected left deltoid.  Patient tolerated injection well.    Reviewed.   Dr Nicki Reaper

## 2016-10-20 ENCOUNTER — Ambulatory Visit (INDEPENDENT_AMBULATORY_CARE_PROVIDER_SITE_OTHER): Payer: PPO | Admitting: Internal Medicine

## 2016-10-20 ENCOUNTER — Encounter: Payer: Self-pay | Admitting: Internal Medicine

## 2016-10-20 VITALS — BP 140/82 | HR 72 | Ht 64.0 in | Wt 159.0 lb

## 2016-10-20 DIAGNOSIS — J4550 Severe persistent asthma, uncomplicated: Secondary | ICD-10-CM | POA: Diagnosis not present

## 2016-10-20 DIAGNOSIS — J4551 Severe persistent asthma with (acute) exacerbation: Secondary | ICD-10-CM

## 2016-10-20 MED ORDER — INDACATEROL-GLYCOPYRROLATE 27.5-15.6 MCG IN CAPS: 1.0000 | ORAL_CAPSULE | Freq: Every day | RESPIRATORY_TRACT | 0 refills | Status: AC

## 2016-10-20 MED ORDER — BENZONATATE 200 MG PO CAPS: 200.0000 mg | ORAL_CAPSULE | Freq: Three times a day (TID) | ORAL | 0 refills | Status: DC | PRN

## 2016-10-20 NOTE — Progress Notes (Signed)
[facility] Consultation      Assessment and Plan:  Asthmatic bronchitis.  -Acute bronchitis, likely viral in etiology, with prolonged episode of asthmatic bronchitis. -Continue Flovent inhaler, will add utibron inhaler to be used once daily for the duration of the sample.  -She is asked to call back in 3-4 weeks. If not doing better for earlier follow-up.  Cough. -Secondary to above, will add Tessalon 200 mg 3 times daily for 1 month. -I've advised her that this episode may last up to 3 months after the initial insult, but should be trailing off over the next few weeks.  Asthma.  -Overall her chronic asthma appears to be well controlled, however, she appears to have flareups with acute bronchitis a few times a year, we'll need to workup further. Once this acute episode is resolved. I have asked her to follow-up in approximately 3 months time.  Date: 10/20/2016  MRN# [id number] DERICK AVITIA 1951-09-16    [name] is a 65 y.o. old female seen in consultation for chief complaint of:    Chief Complaint  Patient presents with  . pulmonary consult    per Dr. Nicki Reaper. pt states she was dx with URI in 09/2016. pt has had 2 rounds prednisone and 1 round of abx. pt c/o lingering non prod cough & occ sob with coughing spell    HPI:   Patient presents with complaints of chronic cough. She has been treated with the course of prednisone and Augmentin with little improvement. Her history includes esophagitis with hiatal hernia, psoriatic arthritis. Patient notes that she coughs throughout the day, and is often coughing worse at night. She notes that the symptoms improved with inhaler, cough medicines, nebulizer. Symptoms are made worse by exertion and fragrances.  She started out with a chest cold about a month ago, she received abx and steroids as above. She was given another course of prednisone. She has a nebulizer because of asthma, she used to see Dr. Raul Del  more than 10 years ago.  Over the years she feels that her asthma has been well controlled, she occasionally gets a bout of bronchitis, can also be made worse by strong cologne. This can happen about 3 times per year.   Thus far she has been taking a codeine cough medicine at night which helps, she takes delsym during the day which helps. Her cough is better now than it was a week ago. She is currently taking flovent 110;  2 puffs twice daily and feels that it helps. She has taken tessalon in the past. Before she got sick she did not use the nebulizer.  She has no pets at home, non smoker.   In addition her husband has noted that she wakes up choking and takes a few seconds before she can catch her breath. This happens a few times per year. She had a sleep study in the past a few years ago, which was normal. Denies reflux or heartburn.     PMHX:   Past Medical History:  Diagnosis Date  . Anemia, unspecified   . Colitis   . Diabetes mellitus ([place])   . Esophagitis   . H/O hiatal hernia   . Hx of arteriovenous malformation (AVM)   . Hypertension   . IBS (irritable bowel syndrome)   . Nephrolithiasis   . Pernicious anemia   . Psoriatic arthritis ([facility])    s/p penicillamine, plaquenil, MTX, sulfasalazine.  s/p Embrel, Humira,.  Iritis.    Marland Kitchen  Pure hypercholesterolemia    Surgical Hx:  Past Surgical History:  Procedure Laterality Date  . ABDOMINAL HYSTERECTOMY  1981   ovaries left in place  . ANKLE SURGERY     right  . BUNIONECTOMY    . CHOLECYSTECTOMY  1989  . HAND SURGERY     right  . HEEL SPUR SURGERY    . NOSE SURGERY     x2  . UMBILICAL HERNIA REPAIR     Family Hx:  Family History  Problem Relation Age of Onset  . Diabetes Other   . Hypertension Other   . Breast cancer Neg Hx   . Colon cancer Neg Hx    Social Hx:   Social History  Substance Use Topics  . Smoking status: Never Smoker  . Smokeless tobacco: Never Used  . Alcohol use No   Medication:   Reviewed     Allergies:  Aspirin; Beta adrenergic blockers; Cobalamine combinations; Daypro [oxaprozin]; Enalapril maleate; Erythromycin; Indocin [indomethacin]; Mtx support [cobalamine combinations]; Vasotec [enalapril]; Verapamil; and Voltaren [diclofenac sodium]  Review of Systems: Gen:  Denies  fever, sweats, chills HEENT: Denies blurred vision, double vision. bleeds, sore throat Cvc:  No dizziness, chest pain. Resp:   Denies cough or sputum production, shortness of breath Gi: Denies swallowing difficulty, stomach pain. Gu:  Denies bladder incontinence, burning urine Ext:   No Joint pain, stiffness. Skin: No skin rash,  hives  Endoc:  No polyuria, polydipsia. Psych: No depression, insomnia. Other:  All other systems were reviewed with the patient and were negative other that what is mentioned in the HPI.   Physical Examination:   VS: There were no vitals taken for this visit.  General Appearance: No distress  Neuro:without focal findings,  speech normal,  HEENT: PERRLA, EOM intact.   Pulmonary: normal breath sounds, No wheezing.  CardiovascularNormal S1,S2.  No m/r/g.   Abdomen: Benign, Soft, non-tender. Renal:  No costovertebral tenderness  GU:  No performed at this time. Endoc: No evident thyromegaly, no signs of acromegaly. Skin:   warm, no rashes, no ecchymosis  Extremities: normal, no cyanosis, clubbing.  Other findings:    LABORATORY PANEL:   CBC No results for input(s): WBC, HGB, HCT, PLT in the last 168 hours. ------------------------------------------------------------------------------------------------------------------  Chemistries  No results for input(s): NA, K, CL, CO2, GLUCOSE, BUN, CREATININE, CALCIUM, MG, AST, ALT, ALKPHOS, BILITOT in the last 168 hours.  Invalid input(s): GFRCGP ------------------------------------------------------------------------------------------------------------------  Cardiac Enzymes No results for input(s): TROPONINI in the last  168 hours. ------------------------------------------------------------  RADIOLOGY:  No results found.     Thank  you for the consultation and for allowing [facility] to assist in the care of your patient. Our recommendations are noted above.  Please contact us if we can be of further service.   Marda Stalker, MD.  Board Certified in Internal Medicine, Pulmonary Medicine, [place], and Sleep Medicine.  [facility] Office Number: [phone]  Patricia Pesa, M.D.  Vilinda Boehringer, M.D.  Merton Border, M.D  10/20/2016

## 2016-10-20 NOTE — Patient Instructions (Addendum)
Start tessalon 200 mg 3 times daily for one month.   Start Utibron inhaler sample once daily.   --Follow up in 3 months, but call back in 3-4 weeks if not doing better.

## 2016-10-23 ENCOUNTER — Encounter: Payer: Self-pay | Admitting: Internal Medicine

## 2016-10-23 ENCOUNTER — Ambulatory Visit (INDEPENDENT_AMBULATORY_CARE_PROVIDER_SITE_OTHER): Payer: PPO | Admitting: Internal Medicine

## 2016-10-23 ENCOUNTER — Other Ambulatory Visit: Payer: PPO

## 2016-10-23 DIAGNOSIS — K589 Irritable bowel syndrome without diarrhea: Secondary | ICD-10-CM | POA: Diagnosis not present

## 2016-10-23 DIAGNOSIS — D649 Anemia, unspecified: Secondary | ICD-10-CM | POA: Diagnosis not present

## 2016-10-23 DIAGNOSIS — E7800 Pure hypercholesterolemia, unspecified: Secondary | ICD-10-CM | POA: Diagnosis not present

## 2016-10-23 DIAGNOSIS — K219 Gastro-esophageal reflux disease without esophagitis: Secondary | ICD-10-CM

## 2016-10-23 DIAGNOSIS — J209 Acute bronchitis, unspecified: Secondary | ICD-10-CM

## 2016-10-23 DIAGNOSIS — I10 Essential (primary) hypertension: Secondary | ICD-10-CM

## 2016-10-23 DIAGNOSIS — E119 Type 2 diabetes mellitus without complications: Secondary | ICD-10-CM

## 2016-10-23 DIAGNOSIS — R600 Localized edema: Secondary | ICD-10-CM

## 2016-10-23 LAB — CBC WITH DIFFERENTIAL/PLATELET
BASOS PCT: 0.7 % (ref 0.0–3.0)
Basophils Absolute: 0 10*3/uL (ref 0.0–0.1)
EOS ABS: 0.1 10*3/uL (ref 0.0–0.7)
EOS PCT: 1.5 % (ref 0.0–5.0)
HCT: 34.4 % — ABNORMAL LOW (ref 36.0–46.0)
Hemoglobin: 11.4 g/dL — ABNORMAL LOW (ref 12.0–15.0)
LYMPHS ABS: 1.7 10*3/uL (ref 0.7–4.0)
Lymphocytes Relative: 27 % (ref 12.0–46.0)
MCHC: 33.2 g/dL (ref 30.0–36.0)
MCV: 89.1 fl (ref 78.0–100.0)
MONO ABS: 0.4 10*3/uL (ref 0.1–1.0)
Monocytes Relative: 6.5 % (ref 3.0–12.0)
Neutro Abs: 4.1 10*3/uL (ref 1.4–7.7)
Neutrophils Relative %: 64.3 % (ref 43.0–77.0)
PLATELETS: 255 10*3/uL (ref 150.0–400.0)
RBC: 3.86 Mil/uL — ABNORMAL LOW (ref 3.87–5.11)
RDW: 14.3 % (ref 11.5–15.5)
WBC: 6.4 10*3/uL (ref 4.0–10.5)

## 2016-10-23 LAB — FERRITIN: FERRITIN: 33.1 ng/mL (ref 10.0–291.0)

## 2016-10-23 MED ORDER — AMOXICILLIN-POT CLAVULANATE 875-125 MG PO TABS: 1.0000 | ORAL_TABLET | Freq: Two times a day (BID) | ORAL | 0 refills | Status: DC

## 2016-10-23 NOTE — Progress Notes (Signed)
Pre-visit discussion using our clinic review tool. No additional management support is needed unless otherwise documented below in the visit note.  

## 2016-10-23 NOTE — Progress Notes (Signed)
Patient ID: Ashley Gross, female   DOB: 1951/03/01, 65 y.o.   MRN: [id number]   Subjective:    Patient ID: Ashley Gross, female    DOB: Nov 26, 1950, 65 y.o.   MRN: [id number]  HPI  Patient here for scheduled follow up.  Has been having issues with increased cough/congestion and lower extremity swelling recently.  See previous notes.  Has been treated with prednisone, abx, inhlaers and nebs.  Saw pulmonary 10/20/16.  Diagnosed with acute bronchitis felt to likely be viral in etiology.  Recommended continuing flovent and added utibron inhaler.  Sample given.  Also added tessalon perles.  Still with some coughing.  Worse at night.  Using delsym.  Did not feel the tessalon perles helped.  Increased drainage.  Some soreness over her left eye.  No vision change.  Using her inhalers.  Have not needed the nebs.  Eating.  No nausea or vomiting.  Bowels overall stable.  Still with swelling in her lower extremities.  Blood pressure has been elevated.    Past Medical History:  Diagnosis Date  . Anemia, unspecified   . Colitis   . Diabetes mellitus ([place])   . Esophagitis   . H/O hiatal hernia   . Hx of arteriovenous malformation (AVM)   . Hypertension   . IBS (irritable bowel syndrome)   . Nephrolithiasis   . Pernicious anemia   . Psoriatic arthritis ([facility])    s/p penicillamine, plaquenil, MTX, sulfasalazine.  s/p Embrel, Humira,.  Iritis.    . Pure hypercholesterolemia    Past Surgical History:  Procedure Laterality Date  . ABDOMINAL HYSTERECTOMY  1981   ovaries left in place  . ANKLE SURGERY     right  . BUNIONECTOMY    . CHOLECYSTECTOMY  1989  . HAND SURGERY     right  . HEEL SPUR SURGERY    . NOSE SURGERY     x2  . UMBILICAL HERNIA REPAIR     Family History  Problem Relation Age of Onset  . Diabetes Other   . Hypertension Other   . Breast cancer Neg Hx   . Colon cancer Neg Hx    Social History   Social History  . Marital status: Married    Spouse name: N/A  . Number of  children: 2  . Years of education: N/A   Social History Main Topics  . Smoking status: Never Smoker  . Smokeless tobacco: Never Used  . Alcohol use No  . Drug use: No  . Sexual activity: Not Asked   Other Topics Concern  . None   Social History Narrative   She is married and has two children    Outpatient Encounter Prescriptions as of 10/23/2016  Medication Sig  . albuterol (ACCUNEB) 0.63 MG/3ML nebulizer solution Take 3 mLs (0.63 mg total) by nebulization every 6 (six) hours as needed for wheezing.  Marland Kitchen albuterol (PROVENTIL HFA;VENTOLIN HFA) 108 (90 BASE) MCG/ACT inhaler Inhale 2 puffs into the lungs every 6 (six) hours as needed.  Marland Kitchen amLODipine (NORVASC) 5 MG tablet take 1 tablet by mouth twice a day  . benzonatate (TESSALON) 200 MG capsule Take 1 capsule (200 mg total) by mouth 3 (three) times daily as needed for cough.  . Calcium Carbonate-Vitamin D (CALTRATE 600+D) 600-400 MG-UNIT per tablet Take 1 tablet by mouth 2 (two) times daily.  . Cholecalciferol (VITAMIN D-3) 1000 UNITS CAPS Take 1 capsule by mouth daily.  . citalopram (CELEXA) 10 MG tablet take 1 tablet by  mouth once daily  . fluticasone (FLOVENT HFA) 110 MCG/ACT inhaler Inhale 2 puffs into the lungs 2 (two) times daily.  Marland Kitchen glipiZIDE (GLUCOTROL XL) 5 MG 24 hr tablet take 1 tablet by mouth once daily  . ipratropium (ATROVENT HFA) 17 MCG/ACT inhaler Inhale 2 puffs into the lungs every 6 (six) hours.  Marland Kitchen lisinopril (PRINIVIL,ZESTRIL) 40 MG tablet take 1 tablet by mouth once daily  . metFORMIN (GLUCOPHAGE) 1000 MG tablet take 1 tablet by mouth twice a day with meals  . omeprazole (PRILOSEC) 20 MG capsule take 1 capsule by mouth once daily  . ONE TOUCH ULTRA TEST test strip PATIENT NEEDS NEW METER STRIPS AND LANCETS FOR ONE TOUCH. HER INSURANCE NO LONGER COVERS ACCUCHEK.  . pioglitazone (ACTOS) 15 MG tablet take 1 tablet by mouth once daily  . predniSONE (DELTASONE) 10 MG tablet Take 6 tablets x 1 day and then decreased by 1/2  tablet per day until down to zero mg.  Marland Kitchen tiZANidine (ZANAFLEX) 2 MG tablet Take 1 tablet (2 mg total) by mouth at bedtime as needed for muscle spasms.  Marland Kitchen triamterene-hydrochlorothiazide (MAXZIDE-25) 37.5-25 MG tablet Take 0.5 tablets by mouth daily.  Marland Kitchen amoxicillin-clavulanate (AUGMENTIN) 875-125 MG tablet Take 1 tablet by mouth 2 (two) times daily.  . [DISCONTINUED] amoxicillin-clavulanate (AUGMENTIN) 875-125 MG tablet Take 1 tablet by mouth 2 (two) times daily.   No facility-administered encounter medications on file as of 10/23/2016.     Review of Systems  Constitutional: Negative for appetite change and unexpected weight change.  HENT: Positive for congestion and postnasal drip.   Eyes: Negative for redness.       Pain over left eye.   Respiratory: Positive for cough. Negative for chest tightness and shortness of breath.   Cardiovascular: Positive for leg swelling. Negative for chest pain and palpitations.  Gastrointestinal: Negative for abdominal pain, nausea and vomiting.  Genitourinary: Negative for difficulty urinating and dysuria.  Musculoskeletal: Negative for joint swelling and myalgias.  Skin: Negative for color change and rash.  Neurological: Negative for dizziness, light-headedness and headaches.  Psychiatric/Behavioral: Negative for agitation and dysphoric mood.       Objective:    Physical Exam  Constitutional: She appears well-developed and well-nourished. No distress.  HENT:  Nose: Nose normal.  Mouth/Throat: Oropharynx is clear and moist.  Eyes: EOM are normal. Right eye exhibits no discharge. Left eye exhibits no discharge.  Neck: Neck supple. No thyromegaly present.  Cardiovascular: Normal rate and regular rhythm.   Pulmonary/Chest: Breath sounds normal. No respiratory distress. She has no wheezes.  Increased air movement.  No increased cough with taking deep breath.    Abdominal: Soft. Bowel sounds are normal. There is no tenderness.  Musculoskeletal: She  exhibits no tenderness.  Some pedal and lower extremity edema.    Lymphadenopathy:    She has no cervical adenopathy.  Skin: No rash noted. No erythema.  Psychiatric: She has a normal mood and affect. Her behavior is normal.    BP (!) 142/84   Pulse 73   Temp 98.1 F (36.7 C) (Oral)   Resp 12   Ht _0  (1.626 m)   Wt 159 lb 8 oz (72.3 kg)   SpO2 98%   BMI 27.38 kg/m  Wt Readings from Last 3 Encounters:  10/23/16 159 lb 8 oz (72.3 kg)  10/20/16 159 lb (72.1 kg)  10/09/16 167 lb 9.6 oz (76 kg)     Lab Results  Component Value Date   WBC 6.4 10/23/2016  HGB 11.4 (L) 10/23/2016   HCT 34.4 (L) 10/23/2016   PLT 255.0 10/23/2016   GLUCOSE 224 (H) 09/22/2016   CHOL 239 (H) 04/15/2016   TRIG 249.0 (H) 04/15/2016   HDL 48.10 04/15/2016   LDLDIRECT 142.0 04/15/2016   LDLCALC 128 (H) 01/17/2014   ALT 10 09/22/2016   AST 17 09/22/2016   NA 139 09/22/2016   K 4.3 09/22/2016   CL 104 09/22/2016   CREATININE 1.17 09/22/2016   BUN 14 09/22/2016   CO2 29 09/22/2016   TSH 1.59 08/28/2016   HGBA1C 6.1 04/15/2016   MICROALBUR 2.9 (H) 11/28/2015    US Venous Img Lower Bilateral  Result Date: 08/28/2016 CLINICAL DATA:  Bilateral lower extremity edema for 6 days EXAM: BILATERAL LOWER EXTREMITY VENOUS DUPLEX ULTRASOUND TECHNIQUE: Doppler venous assessment of the bilateral lower extremity deep venous system was performed, including characterization of spectral flow, compressibility, and phasicity. COMPARISON:  None. FINDINGS: There is complete compressibility of the bilateral common femoral, femoral, and popliteal veins. Doppler analysis demonstrates respiratory phasicity and augmentation of flow with calf compression. No obvious superficial vein or calf vein thrombosis. IMPRESSION: No evidence of DVT. Electronically Signed   By: Marybelle Killings M.D.   On: 08/28/2016 11:43       Assessment & Plan:   Problem List Items Addressed This Visit    Acute bronchitis    Saw pulmonary as  outlined.  Still with some persistent cough.  Is better.  Continue inhalers.  Continue treatment for reflux.  Saline nasal spray and steroid nasal spray as directed.  Follow.        Anemia    Recheck cbc and ferritin.        Bilateral lower extremity edema    Persistent swelling.  Some improvement.  Compression hose.  Restart triam/hctz 1/2 tablet per day.  Follow.  Is on amlodipine and actos.  Has been on these medications for a while.  Doubt related.  Will hold on making changes.  Follow.        Diabetes mellitus ([place])    Low carb diet and exercise.  Follow met b and a1c.        GERD (gastroesophageal reflux disease)    On prilosec.  Appears to be controlled.        Hypercholesterolemia    Low cholesterol diet and exercise.  Follow lipid panel.        Hypertension    Blood pressure elevated recently.  Will restart triam/hctz - 1/2 tablet per day.  Follow pressures.  Follow metabolic panel.        IBS (irritable bowel syndrome)    Bowels overall stable.            Einar Pheasant, MD

## 2016-10-23 NOTE — Patient Instructions (Signed)
Use the nasal sprays and restart mucinex as we discussed.    Restart 1/2 of the fluid pill

## 2016-10-27 ENCOUNTER — Ambulatory Visit: Payer: PPO

## 2016-10-27 ENCOUNTER — Ambulatory Visit (INDEPENDENT_AMBULATORY_CARE_PROVIDER_SITE_OTHER): Payer: PPO

## 2016-10-27 ENCOUNTER — Encounter: Payer: Self-pay | Admitting: Internal Medicine

## 2016-10-27 DIAGNOSIS — E538 Deficiency of other specified B group vitamins: Secondary | ICD-10-CM

## 2016-10-27 MED ORDER — CYANOCOBALAMIN 1000 MCG/ML IJ SOLN
1000.0000 ug | Freq: Once | INTRAMUSCULAR | Status: AC
  Administered 2016-10-27: 1000 ug via INTRAMUSCULAR

## 2016-10-27 NOTE — Progress Notes (Signed)
Patient comes in for 4 th weekly B 12 injection.   Injected right deltoid.  Patient tolerated injection well.

## 2016-10-30 NOTE — Progress Notes (Signed)
I have reviewed the above note and agree.  Deisi Salonga, M.D.  

## 2016-11-03 ENCOUNTER — Encounter: Payer: Self-pay | Admitting: Internal Medicine

## 2016-11-03 NOTE — Assessment & Plan Note (Signed)
Low cholesterol diet and exercise.  Follow lipid panel.   

## 2016-11-03 NOTE — Assessment & Plan Note (Signed)
Bowels overall stable.

## 2016-11-03 NOTE — Assessment & Plan Note (Signed)
Low carb diet and exercise.  Follow met b and a1c.   

## 2016-11-03 NOTE — Assessment & Plan Note (Signed)
Recheck cbc and ferritin.   

## 2016-11-03 NOTE — Assessment & Plan Note (Signed)
On prilosec.  Appears to be controlled.

## 2016-11-03 NOTE — Assessment & Plan Note (Signed)
Saw pulmonary as outlined.  Still with some persistent cough.  Is better.  Continue inhalers.  Continue treatment for reflux.  Saline nasal spray and steroid nasal spray as directed.  Follow.

## 2016-11-03 NOTE — Assessment & Plan Note (Signed)
Blood pressure elevated recently.  Will restart triam/hctz - 1/2 tablet per day.  Follow pressures.  Follow metabolic panel.

## 2016-11-03 NOTE — Assessment & Plan Note (Signed)
Persistent swelling.  Some improvement.  Compression hose.  Restart triam/hctz 1/2 tablet per day.  Follow.  Is on amlodipine and actos.  Has been on these medications for a while.  Doubt related.  Will hold on making changes.  Follow.

## 2016-11-24 ENCOUNTER — Other Ambulatory Visit: Payer: Self-pay | Admitting: *Deleted

## 2016-11-24 MED ORDER — GLIPIZIDE ER 5 MG PO TB24: 5.0000 mg | ORAL_TABLET | Freq: Every day | ORAL | 3 refills | Status: DC

## 2016-12-02 ENCOUNTER — Ambulatory Visit (INDEPENDENT_AMBULATORY_CARE_PROVIDER_SITE_OTHER): Payer: PPO | Admitting: Internal Medicine

## 2016-12-02 ENCOUNTER — Encounter: Payer: Self-pay | Admitting: Internal Medicine

## 2016-12-02 VITALS — BP 124/68 | HR 66 | Temp 98.5°F | Resp 14 | Wt 165.0 lb

## 2016-12-02 DIAGNOSIS — I10 Essential (primary) hypertension: Secondary | ICD-10-CM

## 2016-12-02 DIAGNOSIS — E119 Type 2 diabetes mellitus without complications: Secondary | ICD-10-CM

## 2016-12-02 DIAGNOSIS — D649 Anemia, unspecified: Secondary | ICD-10-CM

## 2016-12-02 DIAGNOSIS — E7800 Pure hypercholesterolemia, unspecified: Secondary | ICD-10-CM

## 2016-12-02 DIAGNOSIS — K589 Irritable bowel syndrome without diarrhea: Secondary | ICD-10-CM

## 2016-12-02 DIAGNOSIS — F439 Reaction to severe stress, unspecified: Secondary | ICD-10-CM | POA: Diagnosis not present

## 2016-12-02 DIAGNOSIS — K219 Gastro-esophageal reflux disease without esophagitis: Secondary | ICD-10-CM

## 2016-12-02 DIAGNOSIS — E2839 Other primary ovarian failure: Secondary | ICD-10-CM

## 2016-12-02 DIAGNOSIS — R945 Abnormal results of liver function studies: Secondary | ICD-10-CM

## 2016-12-02 DIAGNOSIS — R600 Localized edema: Secondary | ICD-10-CM

## 2016-12-02 DIAGNOSIS — R7989 Other specified abnormal findings of blood chemistry: Secondary | ICD-10-CM

## 2016-12-02 DIAGNOSIS — Q6689 Other  specified congenital deformities of feet: Secondary | ICD-10-CM | POA: Diagnosis not present

## 2016-12-02 DIAGNOSIS — M7750 Other enthesopathy of unspecified foot: Secondary | ICD-10-CM | POA: Diagnosis not present

## 2016-12-02 LAB — CBC WITH DIFFERENTIAL/PLATELET
BASOS ABS: 0.1 10*3/uL (ref 0.0–0.1)
Basophils Relative: 1.3 % (ref 0.0–3.0)
Eosinophils Absolute: 0.2 10*3/uL (ref 0.0–0.7)
Eosinophils Relative: 2.6 % (ref 0.0–5.0)
HCT: 38.9 % (ref 36.0–46.0)
Hemoglobin: 12.7 g/dL (ref 12.0–15.0)
LYMPHS ABS: 2.2 10*3/uL (ref 0.7–4.0)
Lymphocytes Relative: 34.1 % (ref 12.0–46.0)
MCHC: 32.6 g/dL (ref 30.0–36.0)
MCV: 89.8 fl (ref 78.0–100.0)
Monocytes Absolute: 0.6 10*3/uL (ref 0.1–1.0)
Monocytes Relative: 9.6 % (ref 3.0–12.0)
NEUTROS ABS: 3.3 10*3/uL (ref 1.4–7.7)
NEUTROS PCT: 52.4 % (ref 43.0–77.0)
PLATELETS: 245 10*3/uL (ref 150.0–400.0)
RBC: 4.34 Mil/uL (ref 3.87–5.11)
RDW: 14.4 % (ref 11.5–15.5)
WBC: 6.3 10*3/uL (ref 4.0–10.5)

## 2016-12-02 LAB — BASIC METABOLIC PANEL
BUN: 22 mg/dL (ref 6–23)
CO2: 28 mEq/L (ref 19–32)
Calcium: 9.1 mg/dL (ref 8.4–10.5)
Chloride: 107 mEq/L (ref 96–112)
Creatinine, Ser: 1.03 mg/dL (ref 0.40–1.20)
GFR: 57.01 mL/min — AB (ref >60.00)
Glucose, Bld: 104 mg/dL — ABNORMAL HIGH (ref 70–99)
POTASSIUM: 3.7 meq/L (ref 3.5–5.1)
SODIUM: 141 meq/L (ref 135–145)

## 2016-12-02 LAB — FERRITIN: Ferritin: 27.9 ng/mL (ref 10.0–291.0)

## 2016-12-02 NOTE — Progress Notes (Signed)
Pre visit review using our clinic review tool, if applicable. No additional management support is needed unless otherwise documented below in the visit note. 

## 2016-12-02 NOTE — Progress Notes (Signed)
Patient ID: Ashley Gross, female   DOB: 03-Dec-1950, 66 y.o.   MRN: [id number]   Subjective:    Patient ID: Ashley Gross, female    DOB: 20-Feb-1951, 66 y.o.   MRN: [id number]  HPI  Patient here for a scheduled follow up.  She is feeling better.  Breathing better.  Decreased cough.  Swelling has resolved.  No chest pain.  No acid reflux.  No abdominal pain or cramping.  Bowels stable.  Back on triam/hctz.  Tolerating.  Blood pressure doing well.     Past Medical History:  Diagnosis Date  . Anemia, unspecified   . Colitis   . Diabetes mellitus ([place])   . Esophagitis   . H/O hiatal hernia   . Hx of arteriovenous malformation (AVM)   . Hypertension   . IBS (irritable bowel syndrome)   . Nephrolithiasis   . Pernicious anemia   . Psoriatic arthritis ([facility])    s/p penicillamine, plaquenil, MTX, sulfasalazine.  s/p Embrel, Humira,.  Iritis.    . Pure hypercholesterolemia    Past Surgical History:  Procedure Laterality Date  . ABDOMINAL HYSTERECTOMY  1981   ovaries left in place  . ANKLE SURGERY     right  . BUNIONECTOMY    . CHOLECYSTECTOMY  1989  . HAND SURGERY     right  . HEEL SPUR SURGERY    . NOSE SURGERY     x2  . UMBILICAL HERNIA REPAIR     Family History  Problem Relation Age of Onset  . Diabetes Other   . Hypertension Other   . Breast cancer Neg Hx   . Colon cancer Neg Hx    Social History   Social History  . Marital status: Married    Spouse name: N/A  . Number of children: 2  . Years of education: N/A   Social History Main Topics  . Smoking status: Never Smoker  . Smokeless tobacco: Never Used  . Alcohol use No  . Drug use: No  . Sexual activity: Not Asked   Other Topics Concern  . None   Social History Narrative   She is married and has two children    Outpatient Encounter Prescriptions as of 12/02/2016  Medication Sig  . albuterol (ACCUNEB) 0.63 MG/3ML nebulizer solution Take 3 mLs (0.63 mg total) by nebulization every 6 (six) hours as  needed for wheezing.  Marland Kitchen albuterol (PROVENTIL HFA;VENTOLIN HFA) 108 (90 BASE) MCG/ACT inhaler Inhale 2 puffs into the lungs every 6 (six) hours as needed.  Marland Kitchen amLODipine (NORVASC) 5 MG tablet take 1 tablet by mouth twice a day  . benzonatate (TESSALON) 200 MG capsule Take 1 capsule (200 mg total) by mouth 3 (three) times daily as needed for cough.  . Calcium Carbonate-Vitamin D (CALTRATE 600+D) 600-400 MG-UNIT per tablet Take 1 tablet by mouth 2 (two) times daily.  . Cholecalciferol (VITAMIN D-3) 1000 UNITS CAPS Take 1 capsule by mouth daily.  . citalopram (CELEXA) 10 MG tablet take 1 tablet by mouth once daily  . fluticasone (FLOVENT HFA) 110 MCG/ACT inhaler Inhale 2 puffs into the lungs 2 (two) times daily.  Marland Kitchen glipiZIDE (GLUCOTROL XL) 5 MG 24 hr tablet Take 1 tablet (5 mg total) by mouth daily.  Marland Kitchen ipratropium (ATROVENT HFA) 17 MCG/ACT inhaler Inhale 2 puffs into the lungs every 6 (six) hours.  Marland Kitchen lisinopril (PRINIVIL,ZESTRIL) 40 MG tablet take 1 tablet by mouth once daily  . metFORMIN (GLUCOPHAGE) 1000 MG tablet take 1 tablet by  mouth twice a day with meals  . omeprazole (PRILOSEC) 20 MG capsule take 1 capsule by mouth once daily  . ONE TOUCH ULTRA TEST test strip PATIENT NEEDS NEW METER STRIPS AND LANCETS FOR ONE TOUCH. HER INSURANCE NO LONGER COVERS ACCUCHEK.  . pioglitazone (ACTOS) 15 MG tablet take 1 tablet by mouth once daily  . predniSONE (DELTASONE) 10 MG tablet Take 6 tablets x 1 day and then decreased by 1/2 tablet per day until down to zero mg.  Marland Kitchen tiZANidine (ZANAFLEX) 2 MG tablet Take 1 tablet (2 mg total) by mouth at bedtime as needed for muscle spasms.  Marland Kitchen triamterene-hydrochlorothiazide (MAXZIDE-25) 37.5-25 MG tablet Take 0.5 tablets by mouth daily.  Marland Kitchen amoxicillin-clavulanate (AUGMENTIN) 875-125 MG tablet Take 1 tablet by mouth 2 (two) times daily. (Patient not taking: Reported on 12/02/2016)   No facility-administered encounter medications on file as of 12/02/2016.     Review of  Systems  Constitutional: Negative for appetite change and unexpected weight change.  HENT: Negative for congestion and sinus pressure.   Respiratory: Negative for chest tightness and shortness of breath.        Cough has improved.    Cardiovascular: Negative for chest pain, palpitations and leg swelling.  Gastrointestinal: Negative for abdominal pain, nausea and vomiting.  Genitourinary: Negative for difficulty urinating and dysuria.  Musculoskeletal: Negative for joint swelling and myalgias.  Skin: Negative for color change and rash.  Neurological: Negative for dizziness, light-headedness and headaches.  Psychiatric/Behavioral: Negative for agitation and dysphoric mood.       Objective:    Physical Exam  Constitutional: She appears well-developed and well-nourished. No distress.  HENT:  Nose: Nose normal.  Mouth/Throat: Oropharynx is clear and moist.  Neck: Neck supple. No thyromegaly present.  Cardiovascular: Normal rate and regular rhythm.   Pulmonary/Chest: Breath sounds normal. No respiratory distress. She has no wheezes.  Abdominal: Soft. Bowel sounds are normal. There is no tenderness.  Musculoskeletal: She exhibits no edema or tenderness.  Lymphadenopathy:    She has no cervical adenopathy.  Skin: No rash noted. No erythema.  Psychiatric: She has a normal mood and affect. Her behavior is normal.    BP 124/68 (BP Location: Left Arm, Patient Position: Sitting, Cuff Size: Normal)   Pulse 66   Temp 98.5 F (36.9 C) (Oral)   Resp 14   Wt 165 lb (74.8 kg)   SpO2 98%   BMI 28.32 kg/m  Wt Readings from Last 3 Encounters:  12/02/16 165 lb (74.8 kg)  10/23/16 159 lb 8 oz (72.3 kg)  10/20/16 159 lb (72.1 kg)     Lab Results  Component Value Date   WBC 6.3 12/02/2016   HGB 12.7 12/02/2016   HCT 38.9 12/02/2016   PLT 245.0 12/02/2016   GLUCOSE 104 (H) 12/02/2016   CHOL 239 (H) 04/15/2016   TRIG 249.0 (H) 04/15/2016   HDL 48.10 04/15/2016   LDLDIRECT 142.0  04/15/2016   LDLCALC 128 (H) 01/17/2014   ALT 10 09/22/2016   AST 17 09/22/2016   NA 141 12/02/2016   K 3.7 12/02/2016   CL 107 12/02/2016   CREATININE 1.03 12/02/2016   BUN 22 12/02/2016   CO2 28 12/02/2016   TSH 1.59 08/28/2016   HGBA1C 6.1 04/15/2016   MICROALBUR 2.9 (H) 11/28/2015    US Venous Img Lower Bilateral  Result Date: 08/28/2016 CLINICAL DATA:  Bilateral lower extremity edema for 6 days EXAM: BILATERAL LOWER EXTREMITY VENOUS DUPLEX ULTRASOUND TECHNIQUE: Doppler venous assessment of the  bilateral lower extremity deep venous system was performed, including characterization of spectral flow, compressibility, and phasicity. COMPARISON:  None. FINDINGS: There is complete compressibility of the bilateral common femoral, femoral, and popliteal veins. Doppler analysis demonstrates respiratory phasicity and augmentation of flow with calf compression. No obvious superficial vein or calf vein thrombosis. IMPRESSION: No evidence of DVT. Electronically Signed   By: Marybelle Killings M.D.   On: 08/28/2016 11:43       Assessment & Plan:   Problem List Items Addressed This Visit    Abnormal liver function test    Follow liver panel.        Anemia - Primary    Follow cbc and ferritin.        Relevant Orders   CBC with Differential/Platelet (Completed)   Ferritin (Completed)   Bilateral lower extremity edema    Swelling improved.  Follow.        Diabetes mellitus ([place])    Low carb diet and exercise.  Follow met b and a1c.        GERD (gastroesophageal reflux disease)    On prilosec.  Upper symptoms controlled.        Hypercholesterolemia    Low cholesterol diet and exercise.  Follow lipid panel.        Hypertension    Blood pressure under good control.  Continue same medication regimen.  Follow pressures.  Follow metabolic panel.        Relevant Orders   Basic metabolic panel (Completed)   IBS (irritable bowel syndrome)    Bowels stable.        Stress    Handling  stress.  Doing better.  Follow.         Other Visit Diagnoses    Estrogen deficiency       Relevant Orders   DG Bone Density       Einar Pheasant, MD

## 2016-12-03 ENCOUNTER — Encounter: Payer: Self-pay | Admitting: Internal Medicine

## 2016-12-04 ENCOUNTER — Telehealth: Payer: Self-pay | Admitting: *Deleted

## 2016-12-04 ENCOUNTER — Encounter: Payer: Self-pay | Admitting: Internal Medicine

## 2016-12-04 MED ORDER — CYCLOBENZAPRINE HCL 5 MG PO TABS: 5.0000 mg | ORAL_TABLET | Freq: Every evening | ORAL | 0 refills | Status: DC | PRN

## 2016-12-04 NOTE — Telephone Encounter (Signed)
Sent in rx for flexeril.  Please notify pt.  Thanks

## 2016-12-04 NOTE — Telephone Encounter (Signed)
Please advise regarding below script for Flexeril patient just seen 12/02/16.   Thanks.

## 2016-12-04 NOTE — Telephone Encounter (Signed)
The last muscle relaxer we gave her was zanaflex.  Would she be willing to take this muscle relaxer.  That is what we gave her in May.

## 2016-12-04 NOTE — Telephone Encounter (Signed)
Pt returned call, she would rather have the generic of flexeril Pt contact [phone]

## 2016-12-04 NOTE — Telephone Encounter (Signed)
Pt was to receive flexeril, however the pharmacy did not receive the Rx Pharmacy [facility] in [place]

## 2016-12-04 NOTE — Telephone Encounter (Signed)
Left message to call.

## 2016-12-04 NOTE — Telephone Encounter (Signed)
Patient advised.

## 2016-12-06 NOTE — Assessment & Plan Note (Signed)
Bowels stable.  

## 2016-12-06 NOTE — Assessment & Plan Note (Signed)
Follow liver panel.  

## 2016-12-06 NOTE — Assessment & Plan Note (Signed)
Handling stress.  Doing better.  Follow.   

## 2016-12-06 NOTE — Assessment & Plan Note (Signed)
Blood pressure under good control.  Continue same medication regimen.  Follow pressures.  Follow metabolic panel.   

## 2016-12-06 NOTE — Assessment & Plan Note (Signed)
Low cholesterol diet and exercise.  Follow lipid panel.   

## 2016-12-06 NOTE — Assessment & Plan Note (Signed)
On prilosec.  Upper symptoms controlled.   

## 2016-12-06 NOTE — Assessment & Plan Note (Signed)
Swelling improved.  Follow.   

## 2016-12-06 NOTE — Assessment & Plan Note (Signed)
Low carb diet and exercise.  Follow met b and a1c.   

## 2016-12-06 NOTE — Assessment & Plan Note (Signed)
Follow cbc and ferritin.  

## 2016-12-12 ENCOUNTER — Other Ambulatory Visit: Payer: Self-pay | Admitting: Internal Medicine

## 2017-01-05 ENCOUNTER — Ambulatory Visit (INDEPENDENT_AMBULATORY_CARE_PROVIDER_SITE_OTHER): Payer: PPO | Admitting: *Deleted

## 2017-01-05 DIAGNOSIS — E538 Deficiency of other specified B group vitamins: Secondary | ICD-10-CM

## 2017-01-05 MED ORDER — CYANOCOBALAMIN 1000 MCG/ML IJ SOLN
1000.0000 ug | Freq: Once | INTRAMUSCULAR | Status: AC
  Administered 2017-01-05: 1000 ug via INTRAMUSCULAR

## 2017-01-05 NOTE — Progress Notes (Addendum)
Patient presented for B 12 injection to right deltoid, patient tolerated well.  Reviewed.  Dr Nicki Reaper

## 2017-01-07 ENCOUNTER — Telehealth: Payer: Self-pay | Admitting: Internal Medicine

## 2017-01-07 NOTE — Telephone Encounter (Signed)
Left pt message asking to call Allison back directly at [phone] to schedule AWV. Thanks! °

## 2017-01-11 ENCOUNTER — Ambulatory Visit
Admission: RE | Admit: 2017-01-11 | Discharge: 2017-01-11 | Disposition: A | Discharge status: Home / Self Care | Payer: PPO | Source: Ambulatory Visit | Attending: Internal Medicine | Admitting: Internal Medicine

## 2017-01-11 ENCOUNTER — Other Ambulatory Visit: Payer: Self-pay | Admitting: Internal Medicine

## 2017-01-11 DIAGNOSIS — Z1231 Encounter for screening mammogram for malignant neoplasm of breast: Secondary | ICD-10-CM | POA: Diagnosis not present

## 2017-01-11 DIAGNOSIS — E2839 Other primary ovarian failure: Secondary | ICD-10-CM | POA: Diagnosis not present

## 2017-01-11 DIAGNOSIS — M810 Age-related osteoporosis without current pathological fracture: Secondary | ICD-10-CM | POA: Diagnosis not present

## 2017-01-11 DIAGNOSIS — M818 Other osteoporosis without current pathological fracture: Secondary | ICD-10-CM | POA: Diagnosis not present

## 2017-01-11 LAB — HM MAMMOGRAPHY

## 2017-02-08 DIAGNOSIS — M2041 Other hammer toe(s) (acquired), right foot: Secondary | ICD-10-CM | POA: Diagnosis not present

## 2017-02-08 DIAGNOSIS — M2042 Other hammer toe(s) (acquired), left foot: Secondary | ICD-10-CM | POA: Diagnosis not present

## 2017-02-08 DIAGNOSIS — M7750 Other enthesopathy of unspecified foot: Secondary | ICD-10-CM | POA: Diagnosis not present

## 2017-02-08 DIAGNOSIS — Q6689 Other  specified congenital deformities of feet: Secondary | ICD-10-CM | POA: Diagnosis not present

## 2017-02-09 ENCOUNTER — Ambulatory Visit (INDEPENDENT_AMBULATORY_CARE_PROVIDER_SITE_OTHER): Payer: PPO | Admitting: *Deleted

## 2017-02-09 DIAGNOSIS — E538 Deficiency of other specified B group vitamins: Secondary | ICD-10-CM | POA: Diagnosis not present

## 2017-02-09 MED ORDER — CYANOCOBALAMIN 1000 MCG/ML IJ SOLN
1000.0000 ug | Freq: Once | INTRAMUSCULAR | Status: AC
  Administered 2017-02-09: 1000 ug via INTRAMUSCULAR

## 2017-02-09 NOTE — Progress Notes (Addendum)
Patient presented for monthly B 12 injection to left deltoid patient voiced no concerns or showed any signs of distress during injection.  Reviewed.  Dr Nicki Reaper

## 2017-02-24 NOTE — Telephone Encounter (Signed)
Left pt message asking to call Allison back directly at [phone] to schedule AWV. Thanks! °

## 2017-03-01 ENCOUNTER — Encounter: Payer: PPO | Admitting: Internal Medicine

## 2017-03-02 ENCOUNTER — Encounter: Payer: Self-pay | Admitting: Internal Medicine

## 2017-03-02 ENCOUNTER — Ambulatory Visit (INDEPENDENT_AMBULATORY_CARE_PROVIDER_SITE_OTHER): Payer: PPO | Admitting: Internal Medicine

## 2017-03-02 VITALS — BP 124/68 | HR 78 | Temp 98.6°F | Resp 12 | Ht 64.0 in | Wt 172.4 lb

## 2017-03-02 DIAGNOSIS — E119 Type 2 diabetes mellitus without complications: Secondary | ICD-10-CM | POA: Diagnosis not present

## 2017-03-02 DIAGNOSIS — R945 Abnormal results of liver function studies: Secondary | ICD-10-CM | POA: Diagnosis not present

## 2017-03-02 DIAGNOSIS — M810 Age-related osteoporosis without current pathological fracture: Secondary | ICD-10-CM

## 2017-03-02 DIAGNOSIS — K219 Gastro-esophageal reflux disease without esophagitis: Secondary | ICD-10-CM | POA: Diagnosis not present

## 2017-03-02 DIAGNOSIS — F439 Reaction to severe stress, unspecified: Secondary | ICD-10-CM | POA: Diagnosis not present

## 2017-03-02 DIAGNOSIS — E7800 Pure hypercholesterolemia, unspecified: Secondary | ICD-10-CM

## 2017-03-02 DIAGNOSIS — R600 Localized edema: Secondary | ICD-10-CM | POA: Diagnosis not present

## 2017-03-02 DIAGNOSIS — Z0000 Encounter for general adult medical examination without abnormal findings: Secondary | ICD-10-CM | POA: Diagnosis not present

## 2017-03-02 DIAGNOSIS — R7989 Other specified abnormal findings of blood chemistry: Secondary | ICD-10-CM

## 2017-03-02 DIAGNOSIS — R21 Rash and other nonspecific skin eruption: Secondary | ICD-10-CM | POA: Diagnosis not present

## 2017-03-02 DIAGNOSIS — I10 Essential (primary) hypertension: Secondary | ICD-10-CM | POA: Diagnosis not present

## 2017-03-02 NOTE — Progress Notes (Signed)
Pre-visit discussion using our clinic review tool. No additional management support is needed unless otherwise documented below in the visit note.  

## 2017-03-02 NOTE — Progress Notes (Addendum)
Patient ID: Ashley Gross, female   DOB: [birth year], 66 y.o.   MRN: [id number]   Subjective:    Patient ID: Ashley Gross, female    DOB: 11-Oct-1951, 66 y.o.   MRN: [id number]  HPI  Patient with past history of hypercholesterolemia, psoriatic arthritis, hypertension and diabetes.  She comes in today to follow up on these issues as well as for a complete physical exam.  She reports she has been doing relatively well.  Increased stress recently.  Discussed with her today.  She feels she is handling things relatively well.  Does not feel needs any further intervention.  Tries to stay active.  Is working.  No chest pain.  No sob.  No acid reflux.  No abdominal pain.  Bowel stable.  Discussed bone density results.  Discussed treatment.  She is agreeable for reclast.  Discussed risk and possible side effects.     Past Medical History:  Diagnosis Date  . Anemia, unspecified   . Colitis   . Diabetes mellitus ([place])   . Esophagitis   . H/O hiatal hernia   . Hx of arteriovenous malformation (AVM)   . Hypertension   . IBS (irritable bowel syndrome)   . Nephrolithiasis   . Pernicious anemia   . Psoriatic arthritis ([facility])    s/p penicillamine, plaquenil, MTX, sulfasalazine.  s/p Embrel, Humira,.  Iritis.    . Pure hypercholesterolemia    Past Surgical History:  Procedure Laterality Date  . ABDOMINAL HYSTERECTOMY  1981   ovaries left in place  . ANKLE SURGERY     right  . BUNIONECTOMY    . CHOLECYSTECTOMY  1989  . HAND SURGERY     right  . HEEL SPUR SURGERY    . NOSE SURGERY     x2  . UMBILICAL HERNIA REPAIR     Family History  Problem Relation Age of Onset  . Diabetes Other   . Hypertension Other   . Breast cancer Neg Hx   . Colon cancer Neg Hx    Social History   Social History  . Marital status: Married    Spouse name: N/A  . Number of children: 2  . Years of education: N/A   Social History Main Topics  . Smoking status: Never Smoker  . Smokeless tobacco: Never Used    . Alcohol use No  . Drug use: No  . Sexual activity: Not Asked   Other Topics Concern  . None   Social History Narrative   She is married and has two children    Outpatient Encounter Prescriptions as of 03/02/2017  Medication Sig  . albuterol (ACCUNEB) 0.63 MG/3ML nebulizer solution Take 3 mLs (0.63 mg total) by nebulization every 6 (six) hours as needed for wheezing.  Marland Kitchen albuterol (PROVENTIL HFA;VENTOLIN HFA) 108 (90 BASE) MCG/ACT inhaler Inhale 2 puffs into the lungs every 6 (six) hours as needed.  Marland Kitchen amLODipine (NORVASC) 5 MG tablet take 1 tablet by mouth twice a day  . benzonatate (TESSALON) 200 MG capsule Take 1 capsule (200 mg total) by mouth 3 (three) times daily as needed for cough.  . Calcium Carbonate-Vitamin D (CALTRATE 600+D) 600-400 MG-UNIT per tablet Take 1 tablet by mouth 2 (two) times daily.  . Cholecalciferol (VITAMIN D-3) 1000 UNITS CAPS Take 1 capsule by mouth daily.  . citalopram (CELEXA) 10 MG tablet take 1 tablet by mouth once daily  . cyclobenzaprine (FLEXERIL) 5 MG tablet Take 1 tablet (5 mg total) by mouth at bedtime  as needed for muscle spasms.  . fluticasone (FLOVENT HFA) 110 MCG/ACT inhaler Inhale 2 puffs into the lungs 2 (two) times daily.  Marland Kitchen glipiZIDE (GLUCOTROL XL) 5 MG 24 hr tablet Take 1 tablet (5 mg total) by mouth daily.  Marland Kitchen ipratropium (ATROVENT HFA) 17 MCG/ACT inhaler Inhale 2 puffs into the lungs every 6 (six) hours.  Marland Kitchen lisinopril (PRINIVIL,ZESTRIL) 40 MG tablet take 1 tablet by mouth once daily  . metFORMIN (GLUCOPHAGE) 1000 MG tablet take 1 tablet by mouth twice a day with meals  . omeprazole (PRILOSEC) 20 MG capsule take 1 capsule by mouth once daily  . ONE TOUCH ULTRA TEST test strip PATIENT NEEDS NEW METER STRIPS AND LANCETS FOR ONE TOUCH. HER INSURANCE NO LONGER COVERS ACCUCHEK.  . pioglitazone (ACTOS) 15 MG tablet take 1 tablet by mouth once daily  . predniSONE (DELTASONE) 10 MG tablet Take 6 tablets x 1 day and then decreased by 1/2 tablet per  day until down to zero mg.  Marland Kitchen tiZANidine (ZANAFLEX) 2 MG tablet Take 1 tablet (2 mg total) by mouth at bedtime as needed for muscle spasms.  Marland Kitchen triamterene-hydrochlorothiazide (MAXZIDE-25) 37.5-25 MG tablet Take 0.5 tablets by mouth daily.  Marland Kitchen triamterene-hydrochlorothiazide (MAXZIDE-25) 37.5-25 MG tablet take 1 tablet by mouth once daily  . [DISCONTINUED] amoxicillin-clavulanate (AUGMENTIN) 875-125 MG tablet Take 1 tablet by mouth 2 (two) times daily.   No facility-administered encounter medications on file as of 03/02/2017.     Review of Systems  Constitutional: Negative for appetite change and unexpected weight change.  HENT: Negative for congestion and sinus pressure.   Eyes: Negative for pain and visual disturbance.  Respiratory: Negative for cough, chest tightness and shortness of breath.   Cardiovascular: Negative for chest pain, palpitations and leg swelling.  Gastrointestinal: Negative for abdominal pain, nausea and vomiting.  Genitourinary: Negative for difficulty urinating and dysuria.  Musculoskeletal: Negative for back pain and joint swelling.  Skin: Negative for color change and rash.  Neurological: Negative for dizziness, light-headedness and headaches.  Hematological: Negative for adenopathy. Does not bruise/bleed easily.  Psychiatric/Behavioral: Negative for agitation and dysphoric mood.       Objective:    Physical Exam  Constitutional: She is oriented to person, place, and time. She appears well-developed and well-nourished. No distress.  HENT:  Nose: Nose normal.  Mouth/Throat: Oropharynx is clear and moist.  Eyes: Right eye exhibits no discharge. Left eye exhibits no discharge. No scleral icterus.  Neck: Neck supple. No thyromegaly present.  Cardiovascular: Normal rate and regular rhythm.   Pulmonary/Chest: Breath sounds normal. No accessory muscle usage. No tachypnea. No respiratory distress. She has no decreased breath sounds. She has no wheezes. She has no  rhonchi. Right breast exhibits no inverted nipple, no mass, no nipple discharge and no tenderness (no axillary adenopathy). Left breast exhibits no inverted nipple, no mass, no nipple discharge and no tenderness (no axilarry adenopathy).  Abdominal: Soft. Bowel sounds are normal. There is no tenderness.  Musculoskeletal: She exhibits no edema or tenderness.  Lymphadenopathy:    She has no cervical adenopathy.  Neurological: She is alert and oriented to person, place, and time.  Skin: Skin is warm. No rash noted. No erythema.  Psychiatric: She has a normal mood and affect. Her behavior is normal.    BP 124/68 (BP Location: Left Arm, Patient Position: Sitting, Cuff Size: Normal)   Pulse 78   Temp 98.6 F (37 C) (Oral)   Resp 12   Ht _0  (1.626 m)  Wt 172 lb 6.4 oz (78.2 kg)   SpO2 96%   BMI 29.59 kg/m  Wt Readings from Last 3 Encounters:  03/02/17 172 lb 6.4 oz (78.2 kg)  12/02/16 165 lb (74.8 kg)  10/23/16 159 lb 8 oz (72.3 kg)     Lab Results  Component Value Date   WBC 6.3 12/02/2016   HGB 12.7 12/02/2016   HCT 38.9 12/02/2016   PLT 245.0 12/02/2016   GLUCOSE 104 (H) 12/02/2016   CHOL 239 (H) 04/15/2016   TRIG 249.0 (H) 04/15/2016   HDL 48.10 04/15/2016   LDLDIRECT 142.0 04/15/2016   LDLCALC 128 (H) 01/17/2014   ALT 10 09/22/2016   AST 17 09/22/2016   NA 141 12/02/2016   K 3.7 12/02/2016   CL 107 12/02/2016   CREATININE 1.03 12/02/2016   BUN 22 12/02/2016   CO2 28 12/02/2016   TSH 1.59 08/28/2016   HGBA1C 6.1 04/15/2016   MICROALBUR 2.9 (H) 11/28/2015    Dg Bone Density  Result Date: 01/11/2017 EXAM: DUAL X-RAY ABSORPTIOMETRY (DXA) FOR BONE MINERAL DENSITY IMPRESSION: Dear Dr. Nicki Reaper, Your patient Kliebert completed a BMD test on 01/11/2017 using the Steele (analysis version: 14.10) manufactured by [employer]. The following summarizes the results of our evaluation. PATIENT BIOGRAPHICAL: Name: Aeriel, Boulay Patient ID: [id number] Birth Date:  November 01, 1951 Height: 64.0 in. Gender: Female Exam Date: 01/11/2017 Weight: 171.0 lbs. Indications: asthma, Caucasian, Diabetic, History of Fracture (Adult), Hysterectomy, Postmenopausal Fractures: Right foot Treatments: actos, Albuterol, glipizide, Metformin, omeprazole, ventolin HFA, Vitamin D ASSESSMENT: The BMD measured at Femur Neck Left is 0.697 g/cm2 with a T-score of -2.5. This patient is considered osteoporotic according to [facility] criteria. Site Region Measured Measured WHO Young Adult BMD Date       Age      Classification T-score AP Spine L1-L4 01/11/2017 65.9 Osteopenia -1.8 0.976 g/cm2 DualFemur Neck Left 01/11/2017 65.9 Osteoporosis -2.5 0.697 g/cm2 World Health Organization [facility] criteria for post-menopausal, Caucasian Women: Normal:       T-score at or above -1 SD Osteopenia:   T-score between -1 and -2.5 SD Osteoporosis: T-score at or below -2.5 SD RECOMMENDATIONS: [place] recommends that FDA-approved medical therapies be considered in postmenopausal women and men age 56 or older with a: 1. Hip or vertebral (clinical or morphometric) fracture. 2. T-score of < -2.5 at the spine or hip. 3. Ten-year fracture probability by FRAX of 3% or greater for hip fracture or 20% or greater for major osteoporotic fracture. All treatment decisions require clinical judgment and consideration of individual patient factors, including patient preferences, co-morbidities, previous drug use, risk factors not captured in the FRAX model (e.g. falls, vitamin D deficiency, increased bone turnover, interval significant decline in bone density) and possible under - or over-estimation of fracture risk by FRAX. All patients should ensure an adequate intake of dietary calcium (1200 mg/d) and vitamin D (800 IU daily) unless contraindicated. FOLLOW-UP: People with diagnosed cases of osteoporosis or at high risk for fracture should have regular bone mineral density tests. For patients  eligible for Medicare, routine testing is allowed once every 2 years. The testing frequency can be increased to one year for patients who have rapidly progressing disease, those who are receiving or discontinuing medical therapy to restore bone mass, or have additional risk factors. I have reviewed this report, and agree with the above findings. [facility] Electronically Signed   By: Claudie Revering M.D.   On: 01/11/2017 09:45  Mm Digital Screening Bilateral  Result Date: 01/11/2017 CLINICAL DATA:  Screening. EXAM: DIGITAL SCREENING BILATERAL MAMMOGRAM WITH CAD COMPARISON:  Previous exam(s). ACR Breast Density Category b: There are scattered areas of fibroglandular density. FINDINGS: There are no findings suspicious for malignancy. Images were processed with CAD. IMPRESSION: No mammographic evidence of malignancy. A result letter of this screening mammogram will be mailed directly to the patient. RECOMMENDATION: Screening mammogram in one year. (Code:SM-B-01Y) BI-RADS CATEGORY  1: Negative. Electronically Signed   By: Lajean Manes M.D.   On: 01/11/2017 12:14       Assessment & Plan:   Problem List Items Addressed This Visit    Abnormal liver function test    Recheck liver panel with next labs.        Bilateral lower extremity edema    Swelling significantly improved.  Follow.        Diabetes mellitus ([place])    Low carb diet and exercise.  Follow met b and a1c.  She is up to date with eye checks.  Unsure when last.        Relevant Orders   Hemoglobin A1c   Microalbumin / creatinine urine ratio   GERD (gastroesophageal reflux disease)    Controlled on omeprazole.        Health care maintenance    Physical today 03/02/17.  s/p hysterectomy.  Mammogram 01/11/17 - Birads I.  Colonoscopy 2014 as outlined.        Hypercholesterolemia    Low cholesterol diet and exercise.  Follow lipid panel.        Relevant Orders   Lipid panel   Hepatic function panel   Hypertension    Blood  pressure under good control.  Continue same medication regimen.  Follow pressures.  Follow metabolic panel.        Relevant Orders   Basic metabolic panel   Osteoporosis    Discussed recent bone density results.  Discussed treatment options.  She agreed to reclast.  Will order labs and start PA.        Relevant Orders   VITAMIN D 25 Hydroxy (Vit-D Deficiency, Fractures)   Urinalysis, Routine w reflex microscopic   Stress    Discussed with her today.  Overall she feels she is handling things relatively well.  Follow.  Desires no further intervention.         Other Visit Diagnoses    Rash    -  Primary   persistent leg rash.  refer to Dr Phillip Heal.     Relevant Orders   Ambulatory referral to Dermatology       Einar Pheasant, MD

## 2017-03-02 NOTE — Assessment & Plan Note (Signed)
Physical today 03/02/17.  s/p hysterectomy.  Mammogram 01/11/17 - Birads I.  Colonoscopy 2014 as outlined.

## 2017-03-07 ENCOUNTER — Encounter: Payer: Self-pay | Admitting: Internal Medicine

## 2017-03-07 NOTE — Assessment & Plan Note (Signed)
Low cholesterol diet and exercise.  Follow lipid panel.   

## 2017-03-07 NOTE — Assessment & Plan Note (Signed)
Blood pressure under good control.  Continue same medication regimen.  Follow pressures.  Follow metabolic panel.   

## 2017-03-07 NOTE — Assessment & Plan Note (Signed)
Low carb diet and exercise.  Follow met b and a1c.  She is up to date with eye checks.  Unsure when last.

## 2017-03-07 NOTE — Assessment & Plan Note (Signed)
Discussed with her today.  Overall she feels she is handling things relatively well.  Follow.  Desires no further intervention.

## 2017-03-07 NOTE — Assessment & Plan Note (Signed)
Recheck liver panel with next labs.

## 2017-03-07 NOTE — Assessment & Plan Note (Signed)
Controlled on omeprazole.   

## 2017-03-07 NOTE — Addendum Note (Signed)
Addended by: Alisa Graff on: 03/07/2017 05:55 PM   Modules accepted: Orders

## 2017-03-07 NOTE — Assessment & Plan Note (Signed)
Swelling significantly improved.  Follow.

## 2017-03-07 NOTE — Assessment & Plan Note (Signed)
Discussed recent bone density results.  Discussed treatment options.  She agreed to reclast.  Will order labs and start PA.

## 2017-03-15 ENCOUNTER — Telehealth: Payer: Self-pay | Admitting: Internal Medicine

## 2017-03-15 NOTE — Telephone Encounter (Signed)
Pt called and stated that she has an appt tomorrow for labs an d a b12. Pt would like to know if Dr. Nicki Reaper would leave a stool card up front for her. Pt states that she has noticed some noticed some blood in her stool for several weeks. Please advise, thank you!  Call pt @ [phone]

## 2017-03-15 NOTE — Telephone Encounter (Signed)
If she is seeing blood, no need to do stool cards.  If noticing mucus stool and blood, needs evaluation.  If I do not have opening, schedule for acute visit or acute care.

## 2017-03-15 NOTE — Telephone Encounter (Signed)
Patient wanting stool cards when she comes in for labs, B 12 injection she is noticing blood bright red blood a  tablespoon amount  in the water 3-4 a week , noticing bloody  mucus when she wipes.  History of IBS and anemia.   Chronic abdominal pain not worsened.   Please advise.

## 2017-03-15 NOTE — Telephone Encounter (Signed)
Left voice mail to call back 

## 2017-03-15 NOTE — Telephone Encounter (Signed)
Spoke with patient advised of below. Advised she could schedule visit here with one of our providers or go to acute care.  She will call back and let us know.

## 2017-03-16 ENCOUNTER — Other Ambulatory Visit (INDEPENDENT_AMBULATORY_CARE_PROVIDER_SITE_OTHER): Payer: PPO

## 2017-03-16 ENCOUNTER — Ambulatory Visit (INDEPENDENT_AMBULATORY_CARE_PROVIDER_SITE_OTHER): Payer: PPO | Admitting: *Deleted

## 2017-03-16 DIAGNOSIS — D518 Other vitamin B12 deficiency anemias: Secondary | ICD-10-CM | POA: Diagnosis not present

## 2017-03-16 DIAGNOSIS — I10 Essential (primary) hypertension: Secondary | ICD-10-CM | POA: Diagnosis not present

## 2017-03-16 DIAGNOSIS — R7989 Other specified abnormal findings of blood chemistry: Secondary | ICD-10-CM

## 2017-03-16 DIAGNOSIS — E7800 Pure hypercholesterolemia, unspecified: Secondary | ICD-10-CM

## 2017-03-16 DIAGNOSIS — M810 Age-related osteoporosis without current pathological fracture: Secondary | ICD-10-CM

## 2017-03-16 DIAGNOSIS — E119 Type 2 diabetes mellitus without complications: Secondary | ICD-10-CM | POA: Diagnosis not present

## 2017-03-16 LAB — URINALYSIS, ROUTINE W REFLEX MICROSCOPIC
Bilirubin Urine: NEGATIVE
Hgb urine dipstick: NEGATIVE
Ketones, ur: NEGATIVE
Nitrite: NEGATIVE
PH: 6 (ref 5.0–8.0)
SPECIFIC GRAVITY, URINE: 1.02 (ref 1.000–1.030)
TOTAL PROTEIN, URINE-UPE24: NEGATIVE
Urine Glucose: NEGATIVE
Urobilinogen, UA: 0.2 (ref 0.0–1.0)

## 2017-03-16 LAB — LIPID PANEL
CHOL/HDL RATIO: 4
CHOLESTEROL: 213 mg/dL — AB (ref 0–200)
HDL: 52.4 mg/dL (ref >39.00)
NonHDL: 160.56
TRIGLYCERIDES: 213 mg/dL — AB (ref 0.0–149.0)
VLDL: 42.6 mg/dL — AB (ref 0.0–40.0)

## 2017-03-16 LAB — LDL CHOLESTEROL, DIRECT: Direct LDL: 112 mg/dL

## 2017-03-16 LAB — BASIC METABOLIC PANEL
BUN: 17 mg/dL (ref 6–23)
CALCIUM: 8.7 mg/dL (ref 8.4–10.5)
CO2: 29 meq/L (ref 19–32)
Chloride: 106 mEq/L (ref 96–112)
Creatinine, Ser: 1.07 mg/dL (ref 0.40–1.20)
GFR: 54.51 mL/min — ABNORMAL LOW (ref >60.00)
GLUCOSE: 138 mg/dL — AB (ref 70–99)
POTASSIUM: 3.7 meq/L (ref 3.5–5.1)
SODIUM: 140 meq/L (ref 135–145)

## 2017-03-16 LAB — MICROALBUMIN / CREATININE URINE RATIO
Creatinine,U: 122.6 mg/dL
MICROALB/CREAT RATIO: 2.9 mg/g (ref 0.0–30.0)
Microalb, Ur: 3.5 mg/dL — ABNORMAL HIGH (ref 0.0–1.9)

## 2017-03-16 LAB — HEPATIC FUNCTION PANEL
ALT: 13 U/L (ref 0–35)
AST: 19 U/L (ref 0–37)
Albumin: 3.7 g/dL (ref 3.5–5.2)
Alkaline Phosphatase: 94 U/L (ref 39–117)
Bilirubin, Direct: 0 mg/dL (ref 0.0–0.3)
Total Bilirubin: 0.3 mg/dL (ref 0.2–1.2)
Total Protein: 6.2 g/dL (ref 6.0–8.3)

## 2017-03-16 LAB — VITAMIN D 25 HYDROXY (VIT D DEFICIENCY, FRACTURES): VITD: 18.67 ng/mL — ABNORMAL LOW (ref 30.00–100.00)

## 2017-03-16 LAB — HEMOGLOBIN A1C: HEMOGLOBIN A1C: 6.9 % — AB (ref 4.6–6.5)

## 2017-03-16 MED ORDER — CYANOCOBALAMIN 1000 MCG/ML IJ SOLN
1000.0000 ug | Freq: Once | INTRAMUSCULAR | Status: AC
  Administered 2017-03-16: 1000 ug via INTRAMUSCULAR

## 2017-03-16 NOTE — Progress Notes (Addendum)
Patient presented for B 12 injection to right deltoid, Patient voiced no concern during injection or showed any sign of distress.   Reviewed.  Dr Scott 

## 2017-03-18 ENCOUNTER — Other Ambulatory Visit: Payer: Self-pay

## 2017-03-18 DIAGNOSIS — E7800 Pure hypercholesterolemia, unspecified: Secondary | ICD-10-CM

## 2017-03-18 MED ORDER — ROSUVASTATIN CALCIUM 5 MG PO TABS: 5.0000 mg | ORAL_TABLET | Freq: Every day | ORAL | 0 refills | Status: DC

## 2017-03-18 NOTE — Addendum Note (Signed)
Addended by: Francella Solian on: 03/18/2017 08:59 AM   Modules accepted: Orders

## 2017-04-10 ENCOUNTER — Other Ambulatory Visit: Payer: Self-pay | Admitting: Internal Medicine

## 2017-04-12 ENCOUNTER — Other Ambulatory Visit (INDEPENDENT_AMBULATORY_CARE_PROVIDER_SITE_OTHER): Payer: PPO

## 2017-04-12 ENCOUNTER — Other Ambulatory Visit: Payer: Self-pay | Admitting: Internal Medicine

## 2017-04-12 DIAGNOSIS — E7800 Pure hypercholesterolemia, unspecified: Secondary | ICD-10-CM

## 2017-04-12 LAB — HEPATIC FUNCTION PANEL
ALK PHOS: 83 U/L (ref 39–117)
ALT: 13 U/L (ref 0–35)
AST: 18 U/L (ref 0–37)
Albumin: 3.8 g/dL (ref 3.5–5.2)
BILIRUBIN DIRECT: 0 mg/dL (ref 0.0–0.3)
BILIRUBIN TOTAL: 0.4 mg/dL (ref 0.2–1.2)
TOTAL PROTEIN: 6.2 g/dL (ref 6.0–8.3)

## 2017-04-12 LAB — LIPID PANEL
CHOL/HDL RATIO: 3
Cholesterol: 157 mg/dL (ref 0–200)
HDL: 62.4 mg/dL (ref >39.00)
LDL CALC: 61 mg/dL (ref 0–99)
NonHDL: 94.16
TRIGLYCERIDES: 168 mg/dL — AB (ref 0.0–149.0)
VLDL: 33.6 mg/dL (ref 0.0–40.0)

## 2017-04-12 NOTE — Progress Notes (Signed)
Order placed for lab 

## 2017-04-13 ENCOUNTER — Other Ambulatory Visit: Payer: Self-pay | Admitting: Internal Medicine

## 2017-04-13 DIAGNOSIS — E7800 Pure hypercholesterolemia, unspecified: Secondary | ICD-10-CM

## 2017-04-13 NOTE — Progress Notes (Signed)
Order placed for f/u liver panel.  

## 2017-04-20 ENCOUNTER — Ambulatory Visit: Payer: PPO

## 2017-04-21 ENCOUNTER — Ambulatory Visit (INDEPENDENT_AMBULATORY_CARE_PROVIDER_SITE_OTHER): Payer: PPO

## 2017-04-21 DIAGNOSIS — E538 Deficiency of other specified B group vitamins: Secondary | ICD-10-CM | POA: Diagnosis not present

## 2017-04-21 MED ORDER — CYANOCOBALAMIN 1000 MCG/ML IJ SOLN
1000.0000 ug | Freq: Once | INTRAMUSCULAR | Status: AC
  Administered 2017-04-21: 1000 ug via INTRAMUSCULAR

## 2017-04-21 NOTE — Progress Notes (Signed)
Patient presented for B 12 injection to Left deltoid, Patient voiced no concern during injection or showed any sign of distress.   Please advise in the absence of PCP

## 2017-04-22 NOTE — Progress Notes (Signed)
I don't think I can sign since not in the office, but if I am able - just send back to me and I will.  Thanks

## 2017-05-17 ENCOUNTER — Ambulatory Visit (INDEPENDENT_AMBULATORY_CARE_PROVIDER_SITE_OTHER): Payer: PPO

## 2017-05-17 ENCOUNTER — Other Ambulatory Visit (INDEPENDENT_AMBULATORY_CARE_PROVIDER_SITE_OTHER): Payer: PPO

## 2017-05-17 VITALS — BP 110/70 | HR 80 | Temp 98.7°F | Resp 14 | Ht 64.0 in | Wt 173.4 lb

## 2017-05-17 DIAGNOSIS — E7800 Pure hypercholesterolemia, unspecified: Secondary | ICD-10-CM

## 2017-05-17 DIAGNOSIS — Z0000 Encounter for general adult medical examination without abnormal findings: Secondary | ICD-10-CM | POA: Diagnosis not present

## 2017-05-17 DIAGNOSIS — Z23 Encounter for immunization: Secondary | ICD-10-CM

## 2017-05-17 LAB — HEPATIC FUNCTION PANEL
ALBUMIN: 3.7 g/dL (ref 3.5–5.2)
ALK PHOS: 78 U/L (ref 39–117)
ALT: 19 U/L (ref 0–35)
AST: 24 U/L (ref 0–37)
Bilirubin, Direct: 0.1 mg/dL (ref 0.0–0.3)
TOTAL PROTEIN: 6.1 g/dL (ref 6.0–8.3)
Total Bilirubin: 0.4 mg/dL (ref 0.2–1.2)

## 2017-05-17 NOTE — Patient Instructions (Addendum)
  Ashley Gross , Thank you for taking time to come for your Medicare Wellness Visit. I appreciate your ongoing commitment to your health goals. Please review the following plan we discussed and let me know if I can assist you in the future.   Follow up with Dr. Nicki Reaper as needed.    Have a great day!  These are the goals we discussed: Goals    . Healthy Lifestyle          Low carb foods Stay hydrated and drink plenty of water Stay active and walk for exercise       This is a list of the screening recommended for you and due dates:  Health Maintenance  Topic Date Due  .  Hepatitis C: One time screening is recommended by Center for Disease Control  (CDC) for  adults born from [birth year] through 1965.   [birth year]  . Tetanus Vaccine  01/24/1970  . Eye exam for diabetics  05/17/2014  . Flu Shot  06/02/2017  . Hemoglobin A1C  09/16/2017  . Complete foot exam   02/16/2018  . Pneumonia vaccines (2 of 2 - PPSV23) 05/17/2018  . Mammogram  01/12/2019  . Colon Cancer Screening  02/09/2023  . DEXA scan (bone density measurement)  Completed

## 2017-05-17 NOTE — Progress Notes (Signed)
Subjective:   Ashley Gross is a 66 y.o. female who presents for an Initial Medicare Annual Wellness Visit.  Review of Systems    No ROS.  Medicare Wellness Visit. Additional risk factors are reflected in the social history.  Cardiac Risk Factors include: hypertension;diabetes mellitus     Objective:    Today's Vitals   05/17/17 1107  BP: 110/70  Pulse: 80  Resp: 14  Temp: 98.7 F (37.1 C)  TempSrc: Oral  SpO2: 98%  Weight: 173 lb 6.4 oz (78.7 kg)  Height: 5\' 4"  (1.626 m)   Body mass index is 29.76 kg/m.   Current Medications (verified) Outpatient Encounter Prescriptions as of 05/17/2017  Medication Sig  . albuterol (ACCUNEB) 0.63 MG/3ML nebulizer solution Take 3 mLs (0.63 mg total) by nebulization every 6 (six) hours as needed for wheezing.  Marland Kitchen albuterol (PROVENTIL HFA;VENTOLIN HFA) 108 (90 BASE) MCG/ACT inhaler Inhale 2 puffs into the lungs every 6 (six) hours as needed.  Marland Kitchen amLODipine (NORVASC) 5 MG tablet take 1 tablet by mouth twice a day  . Calcium Carbonate-Vitamin D (CALTRATE 600+D) 600-400 MG-UNIT per tablet Take 1 tablet by mouth 2 (two) times daily.  . Cholecalciferol (VITAMIN D-3) 1000 UNITS CAPS Take 1 capsule by mouth daily.  . citalopram (CELEXA) 10 MG tablet take 1 tablet by mouth once daily  . cyclobenzaprine (FLEXERIL) 5 MG tablet Take 1 tablet (5 mg total) by mouth at bedtime as needed for muscle spasms.  . fluticasone (FLOVENT HFA) 110 MCG/ACT inhaler Inhale 2 puffs into the lungs 2 (two) times daily.  Marland Kitchen glipiZIDE (GLUCOTROL XL) 5 MG 24 hr tablet Take 1 tablet (5 mg total) by mouth daily.  Marland Kitchen ipratropium (ATROVENT HFA) 17 MCG/ACT inhaler Inhale 2 puffs into the lungs every 6 (six) hours.  Marland Kitchen lisinopril (PRINIVIL,ZESTRIL) 40 MG tablet take 1 tablet by mouth once daily  . metFORMIN (GLUCOPHAGE) 1000 MG tablet take 1 tablet by mouth twice a day with meals  . omeprazole (PRILOSEC) 20 MG capsule take 1 capsule by mouth once daily  . ONE TOUCH ULTRA TEST  test strip PATIENT NEEDS NEW METER STRIPS AND LANCETS FOR ONE TOUCH. HER INSURANCE NO LONGER COVERS ACCUCHEK.  . pioglitazone (ACTOS) 15 MG tablet take 1 tablet by mouth once daily  . rosuvastatin (CRESTOR) 5 MG tablet Take 1 tablet (5 mg total) by mouth daily.  Marland Kitchen tiZANidine (ZANAFLEX) 2 MG tablet Take 1 tablet (2 mg total) by mouth at bedtime as needed for muscle spasms.  Marland Kitchen triamterene-hydrochlorothiazide (MAXZIDE-25) 37.5-25 MG tablet Take 0.5 tablets by mouth daily.  Marland Kitchen triamterene-hydrochlorothiazide (MAXZIDE-25) 37.5-25 MG tablet take 1 tablet by mouth once daily  . benzonatate (TESSALON) 200 MG capsule Take 1 capsule (200 mg total) by mouth 3 (three) times daily as needed for cough. (Patient not taking: Reported on 05/17/2017)  . predniSONE (DELTASONE) 10 MG tablet Take 6 tablets x 1 day and then decreased by 1/2 tablet per day until down to zero mg. (Patient not taking: Reported on 05/17/2017)   No facility-administered encounter medications on file as of 05/17/2017.     Allergies (verified) Aspirin; Beta adrenergic blockers; Cobalamine combinations; Daypro [oxaprozin]; Enalapril maleate; Erythromycin; Indocin [indomethacin]; Mtx support [cobalamine combinations]; Vasotec [enalapril]; Verapamil; and Voltaren [diclofenac sodium]   History: Past Medical History:  Diagnosis Date  . Anemia, unspecified   . Colitis   . Diabetes mellitus ([place])   . Esophagitis   . H/O hiatal hernia   . Hx of arteriovenous malformation (AVM)   .  Hypertension   . IBS (irritable bowel syndrome)   . Nephrolithiasis   . Pernicious anemia   . Psoriatic arthritis ([facility])    s/p penicillamine, plaquenil, MTX, sulfasalazine.  s/p Embrel, Humira,.  Iritis.    . Pure hypercholesterolemia    Past Surgical History:  Procedure Laterality Date  . ABDOMINAL HYSTERECTOMY  1981   ovaries left in place  . ANKLE SURGERY     right  . BUNIONECTOMY    . CHOLECYSTECTOMY  1989  . HAND SURGERY     right  . HEEL SPUR  SURGERY    . NOSE SURGERY     x2  . UMBILICAL HERNIA REPAIR     Family History  Problem Relation Age of Onset  . Diabetes Other   . Hypertension Other   . Breast cancer Neg Hx   . Colon cancer Neg Hx    Social History   Occupational History  . Not on file.   Social History Main Topics  . Smoking status: Never Smoker  . Smokeless tobacco: Never Used  . Alcohol use No  . Drug use: No  . Sexual activity: Not on file    Tobacco Counseling Counseling given: Not Answered   Activities of Daily Living In your present state of health, do you have any difficulty performing the following activities: 05/17/2017  Hearing? N  Vision? N  Difficulty concentrating or making decisions? N  Walking or climbing stairs? N  Dressing or bathing? N  Doing errands, shopping? N  Preparing Food and eating ? N  Using the Toilet? N  In the past six months, have you accidently leaked urine? N  Do you have problems with loss of bowel control? N  Managing your Medications? N  Managing your Finances? N  Housekeeping or managing your Housekeeping? N  Some recent data might be hidden    Immunizations and Health Maintenance Immunization History  Administered Date(s) Administered  . Influenza Whole 07/03/2012  . Influenza-Unspecified 07/17/2014, 07/31/2015, 07/15/2016  . Pneumococcal Conjugate-13 05/17/2017   Health Maintenance Due  Topic Date Due  . Hepatitis C Screening  [birth year]  . TETANUS/TDAP  01/24/1970  . OPHTHALMOLOGY EXAM  05/17/2014    Patient Care Team: Einar Pheasant, MD as PCP - General (Internal Medicine)  Indicate any recent Medical Services you may have received from other than [facility] providers in the past year (date may be approximate).     Assessment:   This is a routine wellness examination for Ashley Gross. The goal of the wellness visit is to assist the patient how to close the gaps in care and create a preventative care plan for the patient.   The roster of all  physicians providing medical care to patient is listed in the Snapshot section of the chart.  Taking calcium VIT D as appropriate/Osteoporosis reviewed.    Taking B12 injections as directed.  Safety issues reviewed; lives with daughter and husband.  Smoke and carbon monoxide detectors in the home. No firearms in the home. Wears seatbelts when driving or riding with others. Patient does wear sunscreen or protective clothing when in direct sunlight. No violence in the home.  Patient is alert, normal appearance, oriented to person/place/and time.  Correctly identified the president of the [place], recall of 3/3 words, and performing simple calculations. Displays appropriate judgement and can read correct time from watch face.   No new identified risk were noted.  No failures at ADL's or IADL's.    BMI- discussed the importance of a  healthy diet, water intake and the benefits of aerobic exercise. Educational material provided.   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  Rotates from sitting to standing as needed.  24 hour diet recall: Breakfast: Cheese nip crackers Lunch: Vegetables, lean meat Dinner: Pasta  Daily fluid intake: 1 cups of caffeine,  2 cups of water  Dental- every 6 months. Dr. Ruthy Dick.  Sleep patterns- Sleeps 6-7 hours at night. Nocturia 1-2. Takes melatonin as directed.   Prevnar 13 administered L deltoid, tolerated well. Educational material provided.  TDAP vaccine deferred per patient preference.  Follow up with insurance.  Educational material provided.  Foot exam- Every 3 months, Sharlotte Alamo.  Hepatitis C Screening discussed.  Future order placed. Educational material provided.    Patient Concerns: None at this time. Follow up with PCP as needed.  Hearing/Vision screen Hearing Screening Comments: Patient is able to hear conversational tones without difficulty.  No issues reported.   Vision Screening Comments: Followed by [facility] (Dr.  Jeni Salles) Wears corrective lenses Last OV 2017 Cataract extraction, bilateral Visual acuity not assessed per patient preference since they have regular follow up with the ophthalmologist  Dietary issues and exercise activities discussed: Current Exercise Habits: Home exercise routine, Time (Minutes): 20, Frequency (Times/Week): 4, Weekly Exercise (Minutes/Week): 80, Intensity: Mild  Goals    . Healthy Lifestyle          Low carb foods Stay hydrated and drink plenty of water Stay active and walk for exercise      Depression Screen PHQ 2/9 Scores 05/17/2017 10/09/2016 09/22/2016 11/18/2015 04/30/2015 05/31/2014  PHQ - 2 Score 0 0 0 0 0 0    Fall Risk Fall Risk  05/17/2017 10/09/2016 09/22/2016 11/18/2015 04/30/2015  Falls in the past year? No No No Yes Yes  Number falls in past yr: - - - 1 1  Injury with Fall? - - - Yes Yes    Cognitive Function: MMSE - Mini Mental State Exam 05/17/2017  Orientation to time 5  Orientation to Place 5  Registration 3  Attention/ Calculation 5  Recall 3  Language- name 2 objects 2  Language- repeat 1  Language- follow 3 step command 3  Language- read & follow direction 1  Write a sentence 1  Copy design 1  Total score 30        Screening Tests Health Maintenance  Topic Date Due  . Hepatitis C Screening  1951/10/12  . TETANUS/TDAP  01/24/1970  . OPHTHALMOLOGY EXAM  05/17/2014  . INFLUENZA VACCINE  06/02/2017  . HEMOGLOBIN A1C  09/16/2017  . FOOT EXAM  02/16/2018  . PNA vac Low Risk Adult (2 of 2 - PPSV23) 05/17/2018  . MAMMOGRAM  01/12/2019  . COLONOSCOPY  02/09/2023  . DEXA SCAN  Completed      Plan:   End of life planning; Advanced aging; Advanced directives discussed.  No HCPOA/Living Will.  Additional information declined at this time.  I have personally reviewed and noted the following in the patient's chart:   . Medical and social history . Use of alcohol, tobacco or illicit drugs  . Current medications and  supplements . Functional ability and status . Nutritional status . Physical activity . Advanced directives . List of other physicians . Hospitalizations, surgeries, and ER visits in previous 12 months . Vitals . Screenings to include cognitive, depression, and falls . Referrals and appointments  In addition, I have reviewed and discussed with patient certain preventive protocols, quality metrics, and best  practice recommendations. A written personalized care plan for preventive services as well as general preventive health recommendations were provided to patient.     Varney Biles, LPN   07/17/412

## 2017-05-17 NOTE — Progress Notes (Signed)
Care was provided under my supervision. I agree with the management as indicated in the note.  Omunique Pederson DO  

## 2017-05-18 ENCOUNTER — Other Ambulatory Visit: Payer: Self-pay | Admitting: Internal Medicine

## 2017-05-18 ENCOUNTER — Encounter: Payer: Self-pay | Admitting: *Deleted

## 2017-05-18 DIAGNOSIS — Q6689 Other  specified congenital deformities of feet: Secondary | ICD-10-CM | POA: Diagnosis not present

## 2017-05-18 DIAGNOSIS — I10 Essential (primary) hypertension: Secondary | ICD-10-CM

## 2017-05-18 DIAGNOSIS — E7800 Pure hypercholesterolemia, unspecified: Secondary | ICD-10-CM

## 2017-05-18 DIAGNOSIS — E119 Type 2 diabetes mellitus without complications: Secondary | ICD-10-CM

## 2017-05-18 DIAGNOSIS — M7750 Other enthesopathy of unspecified foot: Secondary | ICD-10-CM | POA: Diagnosis not present

## 2017-05-18 NOTE — Progress Notes (Signed)
Order placed for f/u labs.  

## 2017-05-25 ENCOUNTER — Ambulatory Visit (INDEPENDENT_AMBULATORY_CARE_PROVIDER_SITE_OTHER): Payer: PPO | Admitting: *Deleted

## 2017-05-25 DIAGNOSIS — E538 Deficiency of other specified B group vitamins: Secondary | ICD-10-CM

## 2017-05-25 MED ORDER — CYANOCOBALAMIN 1000 MCG/ML IJ SOLN
1000.0000 ug | Freq: Once | INTRAMUSCULAR | Status: AC
  Administered 2017-05-25: 1000 ug via INTRAMUSCULAR

## 2017-05-25 NOTE — Progress Notes (Addendum)
Patient presented for B 12 injection to left deltoid, patient voiced no concerns nor showed any signs of distress during injection.  Reviewed.  Dr Scott 

## 2017-06-14 ENCOUNTER — Other Ambulatory Visit: Payer: Self-pay | Admitting: Internal Medicine

## 2017-06-15 NOTE — Telephone Encounter (Signed)
Patient was last seen 10/2016 1st visit. Patient was to f/u in 3 mos. Patient needs appt. Tessalon Perles denied.

## 2017-06-29 ENCOUNTER — Ambulatory Visit (INDEPENDENT_AMBULATORY_CARE_PROVIDER_SITE_OTHER): Payer: PPO

## 2017-06-29 DIAGNOSIS — E538 Deficiency of other specified B group vitamins: Secondary | ICD-10-CM

## 2017-06-29 MED ORDER — CYANOCOBALAMIN 1000 MCG/ML IJ SOLN
1000.0000 ug | Freq: Once | INTRAMUSCULAR | Status: AC
  Administered 2017-06-29: 1000 ug via INTRAMUSCULAR

## 2017-06-29 NOTE — Progress Notes (Signed)
Patient comes in for B 12 injection.  Injection left deltoid.  Patient tolerated injection well.     Reviewed.  Dr Scott 

## 2017-07-02 DIAGNOSIS — Q6689 Other  specified congenital deformities of feet: Secondary | ICD-10-CM | POA: Diagnosis not present

## 2017-07-02 DIAGNOSIS — M7750 Other enthesopathy of unspecified foot: Secondary | ICD-10-CM | POA: Diagnosis not present

## 2017-07-08 ENCOUNTER — Other Ambulatory Visit (INDEPENDENT_AMBULATORY_CARE_PROVIDER_SITE_OTHER): Payer: PPO

## 2017-07-08 DIAGNOSIS — E119 Type 2 diabetes mellitus without complications: Secondary | ICD-10-CM

## 2017-07-08 DIAGNOSIS — E7800 Pure hypercholesterolemia, unspecified: Secondary | ICD-10-CM

## 2017-07-08 DIAGNOSIS — I10 Essential (primary) hypertension: Secondary | ICD-10-CM | POA: Diagnosis not present

## 2017-07-08 LAB — TSH: TSH: 2.68 u[IU]/mL (ref 0.35–4.50)

## 2017-07-08 LAB — BASIC METABOLIC PANEL
BUN: 24 mg/dL — AB (ref 6–23)
CO2: 26 mEq/L (ref 19–32)
CREATININE: 1.08 mg/dL (ref 0.40–1.20)
Calcium: 8.8 mg/dL (ref 8.4–10.5)
Chloride: 106 mEq/L (ref 96–112)
GFR: 53.87 mL/min — AB (ref >60.00)
Glucose, Bld: 152 mg/dL — ABNORMAL HIGH (ref 70–99)
POTASSIUM: 3.3 meq/L — AB (ref 3.5–5.1)
SODIUM: 140 meq/L (ref 135–145)

## 2017-07-08 LAB — HEPATIC FUNCTION PANEL
ALBUMIN: 3.6 g/dL (ref 3.5–5.2)
ALK PHOS: 77 U/L (ref 39–117)
ALT: 16 U/L (ref 0–35)
AST: 22 U/L (ref 0–37)
BILIRUBIN DIRECT: 0.1 mg/dL (ref 0.0–0.3)
TOTAL PROTEIN: 6 g/dL (ref 6.0–8.3)
Total Bilirubin: 0.4 mg/dL (ref 0.2–1.2)

## 2017-07-08 LAB — LIPID PANEL
CHOLESTEROL: 215 mg/dL — AB (ref 0–200)
HDL: 50.4 mg/dL (ref >39.00)
LDL Cholesterol: 124 mg/dL — ABNORMAL HIGH (ref 0–99)
NonHDL: 164.18
TRIGLYCERIDES: 199 mg/dL — AB (ref 0.0–149.0)
Total CHOL/HDL Ratio: 4
VLDL: 39.8 mg/dL (ref 0.0–40.0)

## 2017-07-08 LAB — HEMOGLOBIN A1C: Hgb A1c MFr Bld: 7 % — ABNORMAL HIGH (ref 4.6–6.5)

## 2017-07-12 ENCOUNTER — Ambulatory Visit (INDEPENDENT_AMBULATORY_CARE_PROVIDER_SITE_OTHER): Payer: PPO | Admitting: Internal Medicine

## 2017-07-12 ENCOUNTER — Encounter: Payer: Self-pay | Admitting: Internal Medicine

## 2017-07-12 VITALS — BP 118/76 | HR 64 | Temp 98.6°F | Resp 14 | Ht 64.0 in | Wt 175.6 lb

## 2017-07-12 DIAGNOSIS — K589 Irritable bowel syndrome without diarrhea: Secondary | ICD-10-CM

## 2017-07-12 DIAGNOSIS — I10 Essential (primary) hypertension: Secondary | ICD-10-CM

## 2017-07-12 DIAGNOSIS — M810 Age-related osteoporosis without current pathological fracture: Secondary | ICD-10-CM

## 2017-07-12 DIAGNOSIS — D518 Other vitamin B12 deficiency anemias: Secondary | ICD-10-CM

## 2017-07-12 DIAGNOSIS — K625 Hemorrhage of anus and rectum: Secondary | ICD-10-CM

## 2017-07-12 DIAGNOSIS — E7800 Pure hypercholesterolemia, unspecified: Secondary | ICD-10-CM | POA: Diagnosis not present

## 2017-07-12 DIAGNOSIS — K219 Gastro-esophageal reflux disease without esophagitis: Secondary | ICD-10-CM

## 2017-07-12 DIAGNOSIS — R945 Abnormal results of liver function studies: Secondary | ICD-10-CM | POA: Diagnosis not present

## 2017-07-12 DIAGNOSIS — Z23 Encounter for immunization: Secondary | ICD-10-CM

## 2017-07-12 DIAGNOSIS — E119 Type 2 diabetes mellitus without complications: Secondary | ICD-10-CM | POA: Diagnosis not present

## 2017-07-12 DIAGNOSIS — E876 Hypokalemia: Secondary | ICD-10-CM | POA: Diagnosis not present

## 2017-07-12 DIAGNOSIS — F439 Reaction to severe stress, unspecified: Secondary | ICD-10-CM

## 2017-07-12 DIAGNOSIS — M79645 Pain in left finger(s): Secondary | ICD-10-CM

## 2017-07-12 DIAGNOSIS — R7989 Other specified abnormal findings of blood chemistry: Secondary | ICD-10-CM

## 2017-07-12 LAB — CBC WITH DIFFERENTIAL/PLATELET
BASOS PCT: 1 % (ref 0.0–3.0)
Basophils Absolute: 0.1 10*3/uL (ref 0.0–0.1)
EOS ABS: 0.1 10*3/uL (ref 0.0–0.7)
EOS PCT: 1.8 % (ref 0.0–5.0)
HEMATOCRIT: 37.4 % (ref 36.0–46.0)
HEMOGLOBIN: 12.3 g/dL (ref 12.0–15.0)
LYMPHS PCT: 29.8 % (ref 12.0–46.0)
Lymphs Abs: 1.6 10*3/uL (ref 0.7–4.0)
MCHC: 32.8 g/dL (ref 30.0–36.0)
MCV: 93 fl (ref 78.0–100.0)
Monocytes Absolute: 0.4 10*3/uL (ref 0.1–1.0)
Monocytes Relative: 7.1 % (ref 3.0–12.0)
Neutro Abs: 3.2 10*3/uL (ref 1.4–7.7)
Neutrophils Relative %: 60.3 % (ref 43.0–77.0)
Platelets: 238 10*3/uL (ref 150.0–400.0)
RBC: 4.03 Mil/uL (ref 3.87–5.11)
RDW: 14 % (ref 11.5–15.5)
WBC: 5.3 10*3/uL (ref 4.0–10.5)

## 2017-07-12 LAB — VITAMIN B12: VITAMIN B 12: 648 pg/mL (ref 211–911)

## 2017-07-12 LAB — IBC PANEL
Iron: 178 ug/dL — ABNORMAL HIGH (ref 42–145)
SATURATION RATIOS: 48 % (ref 20.0–50.0)
Transferrin: 265 mg/dL (ref 212.0–360.0)

## 2017-07-12 LAB — FERRITIN: Ferritin: 47.7 ng/mL (ref 10.0–291.0)

## 2017-07-12 LAB — POTASSIUM: POTASSIUM: 3.6 meq/L (ref 3.5–5.1)

## 2017-07-12 MED ORDER — ROSUVASTATIN CALCIUM 5 MG PO TABS: 5.0000 mg | ORAL_TABLET | Freq: Every day | ORAL | 0 refills | Status: DC

## 2017-07-12 NOTE — Progress Notes (Signed)
Patient ID: Ashley Gross, female   DOB: January 17, 1951, 66 y.o.   MRN: [id number]   Subjective:    Patient ID: Ashley Gross, female    DOB: 29-Mar-1951, 66 y.o.   MRN: [id number]  HPI  Patient here for a scheduled follow up.  She reports has been under increased stress recently.  Has moved.  Still trying to get unpacked, etc.  Also, still dealing with her husband's health issues. Discussed with her today.  Overall she feels she is handling things relatively well.  Does not feel needs any further intervention.  No chest pain.  No sob.  No acid reflux.  She does report persistent intermittent rectal bleeding.  Worse when she has been lifting.  Occurs at different times.  No clear triggers.  She also reports left thumb pain - base of thumb.  Splint not helping.  Request referral to ortho.  Discussed labs.  Discussed diet and exercise.     Past Medical History:  Diagnosis Date  . Anemia, unspecified   . Colitis   . Diabetes mellitus ([place])   . Esophagitis   . H/O hiatal hernia   . Hx of arteriovenous malformation (AVM)   . Hypertension   . IBS (irritable bowel syndrome)   . Nephrolithiasis   . Pernicious anemia   . Psoriatic arthritis ([facility])    s/p penicillamine, plaquenil, MTX, sulfasalazine.  s/p Embrel, Humira,.  Iritis.    . Pure hypercholesterolemia    Past Surgical History:  Procedure Laterality Date  . ABDOMINAL HYSTERECTOMY  1981   ovaries left in place  . ANKLE SURGERY     right  . BUNIONECTOMY    . CHOLECYSTECTOMY  1989  . HAND SURGERY     right  . HEEL SPUR SURGERY    . NOSE SURGERY     x2  . UMBILICAL HERNIA REPAIR     Family History  Problem Relation Age of Onset  . Diabetes Other   . Hypertension Other   . Breast cancer Neg Hx   . Colon cancer Neg Hx    Social History   Social History  . Marital status: Married    Spouse name: N/A  . Number of children: 2  . Years of education: N/A   Social History Main Topics  . Smoking status: Never Smoker  .  Smokeless tobacco: Never Used  . Alcohol use No  . Drug use: No  . Sexual activity: Not Asked   Other Topics Concern  . None   Social History Narrative   She is married and has two children    Outpatient Encounter Prescriptions as of 07/12/2017  Medication Sig  . albuterol (ACCUNEB) 0.63 MG/3ML nebulizer solution Take 3 mLs (0.63 mg total) by nebulization every 6 (six) hours as needed for wheezing.  Marland Kitchen albuterol (PROVENTIL HFA;VENTOLIN HFA) 108 (90 BASE) MCG/ACT inhaler Inhale 2 puffs into the lungs every 6 (six) hours as needed.  Marland Kitchen amLODipine (NORVASC) 5 MG tablet take 1 tablet by mouth twice a day  . benzonatate (TESSALON) 200 MG capsule Take 1 capsule (200 mg total) by mouth 3 (three) times daily as needed for cough.  . Calcium Carbonate-Vitamin D (CALTRATE 600+D) 600-400 MG-UNIT per tablet Take 1 tablet by mouth 2 (two) times daily.  . Cholecalciferol (VITAMIN D-3) 1000 UNITS CAPS Take 1 capsule by mouth daily.  . citalopram (CELEXA) 10 MG tablet take 1 tablet by mouth once daily  . cyclobenzaprine (FLEXERIL) 5 MG tablet Take 1 tablet (  5 mg total) by mouth at bedtime as needed for muscle spasms.  . fluticasone (FLOVENT HFA) 110 MCG/ACT inhaler Inhale 2 puffs into the lungs 2 (two) times daily.  Marland Kitchen glipiZIDE (GLUCOTROL XL) 5 MG 24 hr tablet Take 1 tablet (5 mg total) by mouth daily.  Marland Kitchen ipratropium (ATROVENT HFA) 17 MCG/ACT inhaler Inhale 2 puffs into the lungs every 6 (six) hours.  Marland Kitchen lisinopril (PRINIVIL,ZESTRIL) 40 MG tablet take 1 tablet by mouth once daily  . metFORMIN (GLUCOPHAGE) 1000 MG tablet take 1 tablet by mouth twice a day with meals  . omeprazole (PRILOSEC) 20 MG capsule take 1 capsule by mouth once daily  . ONE TOUCH ULTRA TEST test strip PATIENT NEEDS NEW METER STRIPS AND LANCETS FOR ONE TOUCH. HER INSURANCE NO LONGER COVERS ACCUCHEK.  . pioglitazone (ACTOS) 15 MG tablet take 1 tablet by mouth once daily  . rosuvastatin (CRESTOR) 5 MG tablet Take 1 tablet (5 mg total) by  mouth daily.  Marland Kitchen tiZANidine (ZANAFLEX) 2 MG tablet Take 1 tablet (2 mg total) by mouth at bedtime as needed for muscle spasms.  Marland Kitchen triamterene-hydrochlorothiazide (MAXZIDE-25) 37.5-25 MG tablet Take 0.5 tablets by mouth daily.  Marland Kitchen triamterene-hydrochlorothiazide (MAXZIDE-25) 37.5-25 MG tablet take 1 tablet by mouth once daily  . [DISCONTINUED] predniSONE (DELTASONE) 10 MG tablet Take 6 tablets x 1 day and then decreased by 1/2 tablet per day until down to zero mg.  . [DISCONTINUED] rosuvastatin (CRESTOR) 5 MG tablet Take 1 tablet (5 mg total) by mouth daily.   No facility-administered encounter medications on file as of 07/12/2017.     Review of Systems  Constitutional: Negative for appetite change and unexpected weight change.  HENT: Negative for congestion and sinus pressure.   Respiratory: Negative for cough, chest tightness and shortness of breath.   Cardiovascular: Negative for chest pain, palpitations and leg swelling.  Gastrointestinal: Positive for blood in stool. Negative for abdominal pain, nausea and vomiting.  Genitourinary: Negative for difficulty urinating and dysuria.  Musculoskeletal: Negative for myalgias.       Left thumb pain as outlined.    Skin: Negative for color change and rash.  Neurological: Negative for dizziness, light-headedness and headaches.  Psychiatric/Behavioral: Negative for agitation and dysphoric mood.       Increased stress as outlined.         Objective:    Physical Exam  Constitutional: She appears well-developed and well-nourished. No distress.  HENT:  Nose: Nose normal.  Mouth/Throat: Oropharynx is clear and moist.  Neck: Neck supple. No thyromegaly present.  Cardiovascular: Normal rate and regular rhythm.   Pulmonary/Chest: Breath sounds normal. No respiratory distress. She has no wheezes.  Abdominal: Soft. Bowel sounds are normal. There is no tenderness.  Musculoskeletal: She exhibits no edema or tenderness.  Pain - base of left thumb.      Lymphadenopathy:    She has no cervical adenopathy.  Skin: No rash noted. No erythema.  Psychiatric: She has a normal mood and affect. Her behavior is normal.    BP 118/76 (BP Location: Left Arm, Patient Position: Sitting, Cuff Size: Normal)   Pulse 64   Temp 98.6 F (37 C) (Oral)   Resp 14   Ht '5\' 4"'  (1.626 m)   Wt 175 lb 9.6 oz (79.7 kg)   SpO2 96%   BMI 30.14 kg/m  Wt Readings from Last 3 Encounters:  07/12/17 175 lb 9.6 oz (79.7 kg)  05/17/17 173 lb 6.4 oz (78.7 kg)  03/02/17 172 lb 6.4  oz (78.2 kg)     Lab Results  Component Value Date   WBC 5.3 07/12/2017   HGB 12.3 07/12/2017   HCT 37.4 07/12/2017   PLT 238.0 07/12/2017   GLUCOSE 152 (H) 07/08/2017   CHOL 215 (H) 07/08/2017   TRIG 199.0 (H) 07/08/2017   HDL 50.40 07/08/2017   LDLDIRECT 112.0 03/16/2017   LDLCALC 124 (H) 07/08/2017   ALT 16 07/08/2017   AST 22 07/08/2017   NA 140 07/08/2017   K 3.6 07/12/2017   CL 106 07/08/2017   CREATININE 1.08 07/08/2017   BUN 24 (H) 07/08/2017   CO2 26 07/08/2017   TSH 2.68 07/08/2017   HGBA1C 7.0 (H) 07/08/2017   MICROALBUR 3.5 (H) 03/16/2017    Dg Bone Density  Result Date: 01/11/2017 EXAM: DUAL X-RAY ABSORPTIOMETRY (DXA) FOR BONE MINERAL DENSITY IMPRESSION: Dear Dr. Nicki Reaper, Your patient Castleman completed a BMD test on 01/11/2017 using the [place] (analysis version: 14.10) manufactured by [employer]. The following summarizes the results of our evaluation. PATIENT BIOGRAPHICAL: Name: Syerra, Abdelrahman Patient ID: [id number] Birth Date: August 12, 1951 Height: 64.0 in. Gender: Female Exam Date: 01/11/2017 Weight: 171.0 lbs. Indications: asthma, Caucasian, Diabetic, History of Fracture (Adult), Hysterectomy, Postmenopausal Fractures: Right foot Treatments: actos, Albuterol, glipizide, Metformin, omeprazole, ventolin HFA, Vitamin D ASSESSMENT: The BMD measured at Femur Neck Left is 0.697 g/cm2 with a T-score of -2.5. This patient is considered osteoporotic according to  [facility]) criteria. Site Region Measured Measured WHO Young Adult BMD Date       Age      Classification T-score AP Spine L1-L4 01/11/2017 65.9 Osteopenia -1.8 0.976 g/cm2 DualFemur Neck Left 01/11/2017 65.9 Osteoporosis -2.5 0.697 g/cm2 World Health Organization [facility]) criteria for post-menopausal, Caucasian Women: Normal:       T-score at or above -1 SD Osteopenia:   T-score between -1 and -2.5 SD Osteoporosis: T-score at or below -2.5 SD RECOMMENDATIONS: [place] recommends that FDA-approved medical therapies be considered in postmenopausal women and men age 81 or older with a: 1. Hip or vertebral (clinical or morphometric) fracture. 2. T-score of < -2.5 at the spine or hip. 3. Ten-year fracture probability by FRAX of 3% or greater for hip fracture or 20% or greater for major osteoporotic fracture. All treatment decisions require clinical judgment and consideration of individual patient factors, including patient preferences, co-morbidities, previous drug use, risk factors not captured in the FRAX model (e.g. falls, vitamin D deficiency, increased bone turnover, interval significant decline in bone density) and possible under - or over-estimation of fracture risk by FRAX. All patients should ensure an adequate intake of dietary calcium (1200 mg/d) and vitamin D (800 IU daily) unless contraindicated. FOLLOW-UP: People with diagnosed cases of osteoporosis or at high risk for fracture should have regular bone mineral density tests. For patients eligible for Medicare, routine testing is allowed once every 2 years. The testing frequency can be increased to one year for patients who have rapidly progressing disease, those who are receiving or discontinuing medical therapy to restore bone mass, or have additional risk factors. I have reviewed this report, and agree with the above findings. [facility] Radiology Electronically Signed   By: Claudie Revering M.D.   On: 01/11/2017  09:45   Mm Digital Screening Bilateral  Result Date: 01/11/2017 CLINICAL DATA:  Screening. EXAM: DIGITAL SCREENING BILATERAL MAMMOGRAM WITH CAD COMPARISON:  Previous exam(s). ACR Breast Density Category b: There are scattered areas of fibroglandular density. FINDINGS: There are  no findings suspicious for malignancy. Images were processed with CAD. IMPRESSION: No mammographic evidence of malignancy. A result letter of this screening mammogram will be mailed directly to the patient. RECOMMENDATION: Screening mammogram in one year. (Code:SM-B-01Y) BI-RADS CATEGORY  1: Negative. Electronically Signed   By: Lajean Manes M.D.   On: 01/11/2017 12:14       Assessment & Plan:   Problem List Items Addressed This Visit    Abnormal liver function test    Recent liver panel wnl.        Anemia - Primary    With the rectal bleeding as outlined.  Check cbc and ferritin.        Relevant Orders   CBC with Differential/Platelet (Completed)   Ferritin (Completed)   Vitamin B12 (Completed)   IBC panel (Completed)   Diabetes mellitus ([place])    Low carb diet and exercise.  She has been out of her regular routine with moving, etc.  Will hold on making changes in medication.  Discussed low carb diet and exercise.  Follow met b and a1c.  Up to date with eye exam.        Relevant Medications   rosuvastatin (CRESTOR) 5 MG tablet   GERD (gastroesophageal reflux disease)    Controlled on omeprazole.        Hypercholesterolemia    On crestor.  Low cholesterol diet and exercise.  LDL 124.  Follow lipid panel and liver function tests.        Relevant Medications   rosuvastatin (CRESTOR) 5 MG tablet   Hypertension    Blood pressure under good control.  Continue same medication regimen.  Follow pressures.  Follow metabolic panel.        Relevant Medications   rosuvastatin (CRESTOR) 5 MG tablet   IBS (irritable bowel syndrome)    Has a history of IBS.  Now with persistent intermittent bleeding.  Refer to  GI as outlined.  Check cbc.       Osteoporosis    Recent bone density as outlined.  Reclast.       Rectal bleeding    Persistent intermittent bleeding as outlined.  Discussed with her today.  Eating.  No vomiting.  Check cbc.  Refer to GI as outlined.        Relevant Orders   Ambulatory referral to Gastroenterology   Stress    Discussed with her today.  She does not feel needs anything more at this time.  Follow         Other Visit Diagnoses    Encounter for immunization       Relevant Medications   rosuvastatin (CRESTOR) 5 MG tablet   Other Relevant Orders   Flu vaccine HIGH DOSE PF (Completed)   CBC with Differential/Platelet (Completed)   Ferritin (Completed)   Vitamin B12 (Completed)   IBC panel (Completed)   Potassium (Completed)   Ambulatory referral to Orthopedic Surgery   Hypokalemia       Relevant Orders   Potassium (Completed)   Thumb pain, left       Pain base of left thumb.  Persistent.  Splint not helping.  Refer to ortho.     Relevant Orders   Ambulatory referral to Orthopedic Surgery       Einar Pheasant, MD

## 2017-07-13 ENCOUNTER — Encounter: Payer: Self-pay | Admitting: Internal Medicine

## 2017-07-15 ENCOUNTER — Encounter: Payer: Self-pay | Admitting: Internal Medicine

## 2017-07-15 DIAGNOSIS — K625 Hemorrhage of anus and rectum: Secondary | ICD-10-CM

## 2017-07-15 HISTORY — DX: Hemorrhage of anus and rectum: K62.5

## 2017-07-15 NOTE — Assessment & Plan Note (Signed)
Recent bone density as outlined.  Reclast.

## 2017-07-15 NOTE — Assessment & Plan Note (Signed)
Persistent intermittent bleeding as outlined.  Discussed with her today.  Eating.  No vomiting.  Check cbc.  Refer to GI as outlined.

## 2017-07-15 NOTE — Assessment & Plan Note (Signed)
Blood pressure under good control.  Continue same medication regimen.  Follow pressures.  Follow metabolic panel.   

## 2017-07-15 NOTE — Assessment & Plan Note (Signed)
On crestor.  Low cholesterol diet and exercise.  LDL 124.  Follow lipid panel and liver function tests.

## 2017-07-15 NOTE — Assessment & Plan Note (Signed)
Recent liver panel wnl.  

## 2017-07-15 NOTE — Assessment & Plan Note (Signed)
With the rectal bleeding as outlined.  Check cbc and ferritin.

## 2017-07-15 NOTE — Assessment & Plan Note (Signed)
Has a history of IBS.  Now with persistent intermittent bleeding.  Refer to GI as outlined.  Check cbc.

## 2017-07-15 NOTE — Assessment & Plan Note (Signed)
Low carb diet and exercise.  She has been out of her regular routine with moving, etc.  Will hold on making changes in medication.  Discussed low carb diet and exercise.  Follow met b and a1c.  Up to date with eye exam.

## 2017-07-15 NOTE — Assessment & Plan Note (Signed)
Discussed with her today.  She does not feel needs anything more at this time.  Follow.   

## 2017-07-15 NOTE — Assessment & Plan Note (Signed)
Controlled on omeprazole.   

## 2017-07-26 DIAGNOSIS — M19032 Primary osteoarthritis, left wrist: Secondary | ICD-10-CM | POA: Diagnosis not present

## 2017-07-26 DIAGNOSIS — M19039 Primary osteoarthritis, unspecified wrist: Secondary | ICD-10-CM | POA: Insufficient documentation

## 2017-08-03 ENCOUNTER — Ambulatory Visit (INDEPENDENT_AMBULATORY_CARE_PROVIDER_SITE_OTHER): Payer: PPO

## 2017-08-03 DIAGNOSIS — E538 Deficiency of other specified B group vitamins: Secondary | ICD-10-CM | POA: Diagnosis not present

## 2017-08-03 MED ORDER — CYANOCOBALAMIN 1000 MCG/ML IJ SOLN
1000.0000 ug | Freq: Once | INTRAMUSCULAR | Status: AC
  Administered 2017-08-03: 1000 ug via INTRAMUSCULAR

## 2017-08-03 NOTE — Progress Notes (Signed)
Patient comes in for B 12 injection.  Injected right deltoid.  Patient tolerated injection well.   Reviewed.  Dr Scott 

## 2017-08-10 ENCOUNTER — Encounter: Payer: Self-pay | Admitting: Gastroenterology

## 2017-08-10 ENCOUNTER — Ambulatory Visit (INDEPENDENT_AMBULATORY_CARE_PROVIDER_SITE_OTHER): Payer: PPO | Admitting: Gastroenterology

## 2017-08-10 VITALS — BP 119/74 | HR 66 | Temp 98.5°F | Ht 64.0 in | Wt 177.0 lb

## 2017-08-10 DIAGNOSIS — K625 Hemorrhage of anus and rectum: Secondary | ICD-10-CM

## 2017-08-10 DIAGNOSIS — R1084 Generalized abdominal pain: Secondary | ICD-10-CM

## 2017-08-10 DIAGNOSIS — R197 Diarrhea, unspecified: Secondary | ICD-10-CM

## 2017-08-10 DIAGNOSIS — K909 Intestinal malabsorption, unspecified: Secondary | ICD-10-CM

## 2017-08-10 MED ORDER — DICYCLOMINE HCL 20 MG PO TABS: 20.0000 mg | ORAL_TABLET | Freq: Four times a day (QID) | ORAL | 0 refills | Status: DC

## 2017-08-10 MED ORDER — PANCRELIPASE (LIP-PROT-AMYL) 36000-114000 UNITS PO CPEP
36000.0000 [IU] | ORAL_CAPSULE | Freq: Three times a day (TID) | ORAL | 0 refills | Status: DC
Start: 2017-08-10 — End: 2017-09-13

## 2017-08-10 NOTE — Progress Notes (Signed)
Cephas Darby, MD [address]  [place], [place] [address]  Main: [phone]  Fax: [phone]    Gastroenterology Consultation  Referring Provider:     Einar Pheasant, MD Primary Care Physician:  Einar Pheasant, MD Primary Gastroenterologist:  Dr. Cephas Darby Reason for Consultation:     Rectal bleeding        HPI:   Ashley Gross is a 66 y.o. white female referred by Dr. Einar Pheasant, MD  for consultation & management of chronic diarrhea and rectal bleeding. She has known h/o moderate exocrine EPI, IBS-D, inflammatory polyps in colon was originally seeing Dr Redmond Pulling at [facility] until 2014. She is referred here due to change in her insurance. Has not seen Dr Redmond Pulling since 2014. She had extensive work up including EGD/colonoscopy/VCE and unremarkable biopsies. TTG IgA negative, stool WBCs negative, infectious work up negative, normal serum gastrin. Pancreatic elastase levels were 149 in 01/2013. She was on creon in the past which helped, stopped several years ago. Other medications she tried were cholestyramine, imodium, levsin, dicyclomine 20mg .  She had cholecystectomy several years ago. She had MRI pancreas protocol in 05/2013 which showed normal pancreas  She reports several years h/o chronic non-bloody diarrhea anywhere from 5to10BMs/day or upto 20/day during exacerbations. Denies nocturnal diarrhea or leakage or incontinence. She has been experiencing rectal bleeding since Feb or march 2018, once every 1-2 weeks. Currently, experiencing rectal bleeding every day since 3 months. Seeing blood mixed with stools and sometimes only with mucus. She showed pictures of these episodes on her phone. Her BMs are always loose, 5-6 times/day or even more. She does have severe abdominal cramps a/w diarrhea and rectal bleeding. She lost weight in the past, stable in last 1-2 yrs. She denies n/v/f/c. Her diabetes is controlled. She can't tolerate most of the foods, has BM  immediately after eating. She denies bloating. She has not tried low-FODMAPs diet She had symptomatic hemorrhoids in the past, previously itchy, painful. She denies these symptoms at present other than internal hemorrhoids. She takes PPI as needed only. She denies tobacco, ETOH, NSAID use She does not have anemia  She has h/o psoriatic arthritis, previously on remicade (developed severe myalgias), plaquenil, pencillamine, humira, embrel. Off therapy since 04/2013  GI Procedures:  Colonoscopy 04/06/2007 Indications:last colonoscopy 2001, anemia. Impression: -Congested mucosa in the entire colon. -Nodules in the proximal ascending colon, biopsied. -Nodule in the distal ascending colon, biopsied. -Congested erythematous and friable (with contact bleeding) mucosa in the  ascending colon and in the cecum. . "CECUM" (ENDOSCOPIC BIOPSY) COLONIC MUCOSA WITH NO PATHOLOGIC DIAGNOSIS. NO ACTIVE OR CHRONIC COLITIS IS SEEN. NO EVIDENCE OF LYMPHOCYTIC OR COLLAGENOUS COLITIS IS SEEN. B. "RIGHT COLON NODULES" (ENDOSCOPIC BIOPSY): INFLAMMATORY POLYPS. C. "RECTOSIGMOID COLON" (ENDOSCOPIC BIOPSY): COLONIC MUCOSA WITH NO PATHOLOGIC DIAGNOSIS. NO ACTIVE OR CHRONIC COLITIS IS SEEN. NO EVIDENCE OF LYMPHOCYTIC OR COLLAGENOUS COLITIS IS SEEN. D. "DISTAL ASCENDING COLON" (ENDOSCOPIC BIOPSY): INFLAMMATORY POLYPS. ------------------------------------------------------------------------------------------------- Colonoscopy 2014  A. "ILEUM" (ENDOSCOPIC BIOPSY): SMALL INTESTINAL MUCOSA WITH NO PATHOLOGIC DIAGNOSIS. NO ACTIVE OR CHRONIC ENTERITIS IS SEEN. NO GRANULOMATOUS INFLAMMATION IS SEEN. B. "COLON, ILEOCECAL VALVE NODULE" (BIOPSY): COLONIC MUCOSA, WITH REACTIVE CHANGES. NO ADENOMATOUS MUCOSA IS IDENTIFIED. C. "RIGHT COLON" (ENDOSCOPIC BIOPSY): COLONIC MUCOSA WITH NO PATHOLOGIC DIAGNOSIS. NO ACTIVE OR CHRONIC COLITIS IS SEEN. NO EVIDENCE OF  LYMPHOCYTIC OR COLLAGENOUS COLITIS IS SEEN. D. "HEPATIC FLEXURE MASS" (ENDOSCOPIC BIOPSY): INFLAMMATORY POLYP. NO ADENOMATOUS MUCOSA IS SEEN. E. "TRANSVERSE COLON" (ENDOSCOPIC BIOPSY): COLONIC MUCOSA WITH NO  PATHOLOGIC DIAGNOSIS. NO ACTIVE OR CHRONIC COLITIS IS SEEN. NO EVIDENCE OF LYMPHOCYTIC OR COLLAGENOUS COLITIS IS SEEN. F. "LEFT COLON" (ENDOSCOPIC BIOPSY): COLONIC MUCOSA WITH NO PATHOLOGIC DIAGNOSIS. NO ACTIVE OR CHRONIC COLITIS IS SEEN. NO EVIDENCE OF LYMPHOCYTIC OR COLLAGENOUS COLITIS IS SEEN. A MINUTE HYPERPLASTIC POLYP IS ALSO PRESENT. G. "LEFT COLON POLYP(S)" (ENDOSCOPIC POLYPECTOMY): TUBULAR ADENOMA. NO HIGH GRADE DYSPLASIA OR CARCINOMA IS SEEN. Past Medical History:  Diagnosis Date  . Anemia, unspecified   . Colitis   . Diabetes mellitus ([place])   . Esophagitis   . H/O hiatal hernia   . Hx of arteriovenous malformation (AVM)   . Hypertension   . IBS (irritable bowel syndrome)   . Nephrolithiasis   . Pernicious anemia   . Psoriatic arthritis ([facility])    s/p penicillamine, plaquenil, MTX, sulfasalazine.  s/p Embrel, Humira,.  Iritis.    . Pure hypercholesterolemia     Past Surgical History:  Procedure Laterality Date  . ABDOMINAL HYSTERECTOMY  1981   ovaries left in place  . ANKLE SURGERY     right  . BUNIONECTOMY    . CHOLECYSTECTOMY  1989  . HAND SURGERY     right  . HEEL SPUR SURGERY    . NOSE SURGERY     x2  . UMBILICAL HERNIA REPAIR      Prior to Admission medications   Medication Sig Start Date End Date Taking? Authorizing Provider  albuterol (ACCUNEB) 0.63 MG/3ML nebulizer solution Take 3 mLs (0.63 mg total) by nebulization every 6 (six) hours as needed for wheezing. 09/22/16  Yes Einar Pheasant, MD  albuterol (PROVENTIL HFA;VENTOLIN HFA) 108 (90 BASE) MCG/ACT inhaler Inhale 2 puffs into the lungs every 6 (six) hours as needed. 04/30/15  Yes Einar Pheasant, MD  amLODipine (NORVASC) 5 MG tablet take 1 tablet  by mouth twice a day 12/14/16  Yes Einar Pheasant, MD  benzonatate (TESSALON) 200 MG capsule Take 1 capsule (200 mg total) by mouth 3 (three) times daily as needed for cough. 10/20/16  Yes Laverle Hobby, MD  Calcium Carbonate-Vitamin D (CALTRATE 600+D) 600-400 MG-UNIT per tablet Take 1 tablet by mouth 2 (two) times daily.   Yes [provider]  Cholecalciferol (VITAMIN D-3) 1000 UNITS CAPS Take 1 capsule by mouth daily.   Yes [provider]  cyclobenzaprine (FLEXERIL) 5 MG tablet Take 1 tablet (5 mg total) by mouth at bedtime as needed for muscle spasms. 12/04/16  Yes Einar Pheasant, MD  glipiZIDE (GLUCOTROL XL) 5 MG 24 hr tablet Take 1 tablet (5 mg total) by mouth daily. 11/24/16  Yes Einar Pheasant, MD  ipratropium (ATROVENT HFA) 17 MCG/ACT inhaler Inhale 2 puffs into the lungs every 6 (six) hours.   Yes [provider]  lisinopril (PRINIVIL,ZESTRIL) 40 MG tablet take 1 tablet by mouth once daily 04/12/17  Yes Einar Pheasant, MD  metFORMIN (GLUCOPHAGE) 1000 MG tablet take 1 tablet by mouth twice a day with meals 12/14/16  Yes Einar Pheasant, MD  omeprazole (PRILOSEC) 20 MG capsule take 1 capsule by mouth once daily 12/14/16  Yes Einar Pheasant, MD  ONE TOUCH ULTRA TEST test strip PATIENT NEEDS NEW METER STRIPS AND LANCETS FOR ONE TOUCH. HER INSURANCE NO LONGER COVERS ACCUCHEK. 07/31/15  Yes Einar Pheasant, MD  pioglitazone (ACTOS) 15 MG tablet take 1 tablet by mouth once daily 12/14/16  Yes Einar Pheasant, MD  rosuvastatin (CRESTOR) 5 MG tablet Take 1 tablet (5 mg total) by mouth daily. 07/12/17  Yes Einar Pheasant, MD  tiZANidine (ZANAFLEX)  2 MG tablet Take 1 tablet (2 mg total) by mouth at bedtime as needed for muscle spasms. 03/23/16  Yes Einar Pheasant, MD  triamterene-hydrochlorothiazide (MAXZIDE-25) 37.5-25 MG tablet Take 0.5 tablets by mouth daily. 04/16/16  Yes Einar Pheasant, MD  triamterene-hydrochlorothiazide [facility]) 37.5-25 MG tablet take 1 tablet  by mouth once daily 12/14/16  Yes Einar Pheasant, MD    Family History  Problem Relation Age of Onset  . Diabetes Other   . Hypertension Other   . Breast cancer Neg Hx   . Colon cancer Neg Hx      Social History  Substance Use Topics  . Smoking status: Never Smoker  . Smokeless tobacco: Never Used  . Alcohol use No    Allergies as of 08/10/2017 - Review Complete 08/10/2017  Allergen Reaction Noted  . Aspirin Other (See Comments) 10/06/2012  . Beta adrenergic blockers  10/06/2012  . Cobalamine combinations Other (See Comments) 11/18/2015  . Daypro [oxaprozin]  10/06/2012  . Enalapril maleate Other (See Comments) 11/18/2015  . Erythromycin Other (See Comments) 10/06/2012  . Indocin [indomethacin]  10/06/2012  . Mtx support [cobalamine combinations] Other (See Comments) 10/06/2012  . Vasotec [enalapril] Cough 10/06/2012  . Verapamil Other (See Comments) 10/06/2012  . Voltaren [diclofenac sodium]  10/06/2012    Review of Systems:    All systems reviewed and negative except where noted in HPI.   Physical Exam:  BP 119/74   Pulse 66   Temp 98.5 F (36.9 C) (Oral)   Ht 5\' 4"  (1.626 m)   Wt 177 lb (80.3 kg)   BMI 30.38 kg/m  No LMP recorded. Patient has had a hysterectomy.  General:   Alert,  Well-developed, well-nourished, pleasant and cooperative in NAD Head:  Normocephalic and atraumatic. Eyes:  Sclera clear, no icterus.   Conjunctiva pink. Ears:  Normal auditory acuity. Nose:  No deformity, discharge, or lesions. Mouth:  No deformity or lesions,oropharynx pink & moist. Neck:  Supple; no masses or thyromegaly. Lungs:  Respirations even and unlabored.  Clear throughout to auscultation.   No wheezes, crackles, or rhonchi. No acute distress. Heart:  Regular rate and rhythm; no murmurs, clicks, rubs, or gallops. Abdomen:  Normal bowel sounds.  Obese, Soft, non-tender and non-distended without masses, hepatosplenomegaly or hernias noted.  No guarding or rebound  tenderness.   Rectal: revealed large left lateral internal hemorrhoid, brown stool on glove Msk:  Symmetrical without gross deformities. Good, equal movement & strength bilaterally. Pulses:  Normal pulses noted. Extremities:  No clubbing or edema.  No cyanosis. Neurologic:  Alert and oriented x3;  grossly normal neurologically. Skin:  Intact without significant lesions or rashes. No jaundice. Psych:  Alert and cooperative. Normal mood and affect.  Imaging Studies: Reviewed  Assessment and Plan:   CAMRI MOLLOY is a 66 y.o. white female with h/o psoriatic arthritis, moderate EPI, IBS-D, inflammatory polyps in colon presents with 3 months h/o rectal bleeding and chronic diarrhea a/w severe abdominal cramps. She does have large internal hemorrhoid. Had extensive workup in the past which is negative for gastrinoma, IBD, microscopic colitis, celiac disease, chronic infections.   - Check C Diff and GI pathogen panel - CRP - Start creon 36K, 2 pills with major meals, 1pill with snack - Levsin not covered by her insurance, prescribed dicyclomine - Schedule hemorrhoid clinic visit for hemorrhoid banding due to significant rectal bleeding after stool studies are negative   Follow up in 4weeks   Cephas Darby, MD

## 2017-08-11 ENCOUNTER — Other Ambulatory Visit: Payer: Self-pay

## 2017-08-11 DIAGNOSIS — M810 Age-related osteoporosis without current pathological fracture: Secondary | ICD-10-CM | POA: Insufficient documentation

## 2017-08-11 DIAGNOSIS — K909 Intestinal malabsorption, unspecified: Secondary | ICD-10-CM

## 2017-08-11 DIAGNOSIS — R197 Diarrhea, unspecified: Secondary | ICD-10-CM

## 2017-08-11 DIAGNOSIS — K625 Hemorrhage of anus and rectum: Secondary | ICD-10-CM

## 2017-08-13 ENCOUNTER — Ambulatory Visit (INDEPENDENT_AMBULATORY_CARE_PROVIDER_SITE_OTHER): Payer: PPO | Admitting: Internal Medicine

## 2017-08-13 ENCOUNTER — Encounter: Payer: Self-pay | Admitting: Internal Medicine

## 2017-08-13 ENCOUNTER — Ambulatory Visit (INDEPENDENT_AMBULATORY_CARE_PROVIDER_SITE_OTHER): Payer: PPO

## 2017-08-13 VITALS — BP 110/84 | HR 100 | Temp 101.7°F | Resp 14 | Wt 174.2 lb

## 2017-08-13 DIAGNOSIS — R059 Cough, unspecified: Secondary | ICD-10-CM

## 2017-08-13 DIAGNOSIS — R509 Fever, unspecified: Secondary | ICD-10-CM

## 2017-08-13 DIAGNOSIS — R05 Cough: Secondary | ICD-10-CM

## 2017-08-13 DIAGNOSIS — R6889 Other general symptoms and signs: Secondary | ICD-10-CM

## 2017-08-13 LAB — POCT INFLUENZA A/B
Influenza A, POC: NEGATIVE
Influenza B, POC: NEGATIVE

## 2017-08-13 MED ORDER — DOXYCYCLINE HYCLATE 100 MG PO TABS: 100.0000 mg | ORAL_TABLET | Freq: Two times a day (BID) | ORAL | 0 refills | Status: DC

## 2017-08-13 MED ORDER — OSELTAMIVIR PHOSPHATE 75 MG PO CAPS: 75.0000 mg | ORAL_CAPSULE | Freq: Two times a day (BID) | ORAL | 0 refills | Status: DC

## 2017-08-13 NOTE — Progress Notes (Signed)
Patient ID: [name], female   DOB: [birth year], 66 y.o.   MRN: [id number]   Subjective:    Patient ID: [name], female    DOB: 08-24-1951, 66 y.o.   MRN: [id number]  HPI  Patient here as a work in with concerns regarding increased cough, congestion, aching and fever.  She reports that symptoms started over the last two days.  First developed chills and felt cold.  Chills persistent yesterday.  Last pm - fever - 102.8.  Took tylenol.  100.3 this am.  Emesis.  Headache.  Some drainage.  No sinus pressure.  Scratchy throat.  Increased cough - non productive.  Increased tightness in her chest.  Decreased appetite.  Is drinking liquids.  Saw GI for her bowels.  Planning to have hemorrhoid procedure 09/13/17.  No bites.  No rash.    Past Medical History:  Diagnosis Date  . Anemia, unspecified   . Colitis   . Diabetes mellitus ([place])   . Esophagitis   . H/O hiatal hernia   . Hx of arteriovenous malformation (AVM)   . Hypertension   . IBS (irritable bowel syndrome)   . Nephrolithiasis   . Pernicious anemia   . Psoriatic arthritis ([facility])    s/p penicillamine, plaquenil, MTX, sulfasalazine.  s/p Embrel, Humira,.  Iritis.    . Pure hypercholesterolemia    Past Surgical History:  Procedure Laterality Date  . ABDOMINAL HYSTERECTOMY  1981   ovaries left in place  . ANKLE SURGERY     right  . BUNIONECTOMY    . CHOLECYSTECTOMY  1989  . HAND SURGERY     right  . HEEL SPUR SURGERY    . NOSE SURGERY     x2  . UMBILICAL HERNIA REPAIR     Family History  Problem Relation Age of Onset  . Diabetes Other   . Hypertension Other   . Breast cancer Neg Hx   . Colon cancer Neg Hx    Social History   Social History  . Marital status: Married    Spouse name: N/A  . Number of children: 2  . Years of education: N/A   Social History Main Topics  . Smoking status: Never Smoker  . Smokeless tobacco: Never Used  . Alcohol use No  . Drug use: No  . Sexual activity: Not Asked    Other Topics Concern  . None   Social History Narrative   She is married and has two children    Outpatient Encounter Prescriptions as of 08/13/2017  Medication Sig  . albuterol (ACCUNEB) 0.63 MG/3ML nebulizer solution Take 3 mLs (0.63 mg total) by nebulization every 6 (six) hours as needed for wheezing.  Marland Kitchen albuterol (PROVENTIL HFA;VENTOLIN HFA) 108 (90 BASE) MCG/ACT inhaler Inhale 2 puffs into the lungs every 6 (six) hours as needed.  Marland Kitchen amLODipine (NORVASC) 5 MG tablet take 1 tablet by mouth twice a day  . benzonatate (TESSALON) 200 MG capsule Take 1 capsule (200 mg total) by mouth 3 (three) times daily as needed for cough.  . Calcium Carbonate-Vitamin D (CALTRATE 600+D) 600-400 MG-UNIT per tablet Take 1 tablet by mouth 2 (two) times daily.  . Cholecalciferol (VITAMIN D-3) 1000 UNITS CAPS Take 1 capsule by mouth daily.  . cyclobenzaprine (FLEXERIL) 5 MG tablet Take 1 tablet (5 mg total) by mouth at bedtime as needed for muscle spasms.  Marland Kitchen dicyclomine (BENTYL) 20 MG tablet Take 1 tablet (20 mg total) by mouth every 6 (six) hours.  Marland Kitchen  glipiZIDE (GLUCOTROL XL) 5 MG 24 hr tablet Take 1 tablet (5 mg total) by mouth daily.  Marland Kitchen ipratropium (ATROVENT HFA) 17 MCG/ACT inhaler Inhale 2 puffs into the lungs every 6 (six) hours.  . lipase/protease/amylase (CREON) 36000 UNITS CPEP capsule Take 1 capsule (36,000 Units total) by mouth 3 (three) times daily with meals. Take additional 1 capsule with snack  . lisinopril (PRINIVIL,ZESTRIL) 40 MG tablet take 1 tablet by mouth once daily  . metFORMIN (GLUCOPHAGE) 1000 MG tablet take 1 tablet by mouth twice a day with meals  . omeprazole (PRILOSEC) 20 MG capsule take 1 capsule by mouth once daily  . ONE TOUCH ULTRA TEST test strip PATIENT NEEDS NEW METER STRIPS AND LANCETS FOR ONE TOUCH. HER INSURANCE NO LONGER COVERS ACCUCHEK.  . pioglitazone (ACTOS) 15 MG tablet take 1 tablet by mouth once daily  . rosuvastatin (CRESTOR) 5 MG tablet Take 1 tablet (5 mg total)  by mouth daily.  Marland Kitchen tiZANidine (ZANAFLEX) 2 MG tablet Take 1 tablet (2 mg total) by mouth at bedtime as needed for muscle spasms.  Marland Kitchen triamterene-hydrochlorothiazide (MAXZIDE-25) 37.5-25 MG tablet Take 0.5 tablets by mouth daily.  Marland Kitchen triamterene-hydrochlorothiazide (MAXZIDE-25) 37.5-25 MG tablet take 1 tablet by mouth once daily  . doxycycline (VIBRA-TABS) 100 MG tablet Take 1 tablet (100 mg total) by mouth 2 (two) times daily.  Marland Kitchen oseltamivir (TAMIFLU) 75 MG capsule Take 1 capsule (75 mg total) by mouth 2 (two) times daily.   No facility-administered encounter medications on file as of 08/13/2017.     Review of Systems  Constitutional: Positive for fever.       Decreased appetite.    HENT: Positive for congestion, postnasal drip and sore throat.   Respiratory: Positive for cough and chest tightness. Negative for shortness of breath.   Cardiovascular: Negative for chest pain, palpitations and leg swelling.  Gastrointestinal: Positive for nausea and vomiting. Negative for abdominal pain.  Genitourinary: Negative for dysuria.  Skin: Negative for color change and rash.  Neurological: Positive for headaches. Negative for dizziness.       Objective:    Physical Exam  Constitutional: She appears well-developed and well-nourished. No distress.  HENT:  Nose: Nose normal.  Mouth/Throat: Oropharynx is clear and moist.  Neck: Neck supple.  Cardiovascular: Normal rate and regular rhythm.   Pulmonary/Chest: Breath sounds normal. No respiratory distress. She has no wheezes.  Some increased cough with forced expiration.    Abdominal: Soft. Bowel sounds are normal. There is no tenderness.  Musculoskeletal: She exhibits no edema or tenderness.  Lymphadenopathy:    She has no cervical adenopathy.  Skin: No rash noted. No erythema.  Psychiatric: She has a normal mood and affect. Her behavior is normal.    BP 110/84 (BP Location: Left Arm, Patient Position: Sitting, Cuff Size: Normal)   Pulse 100    Temp (!) 101.7 F (38.7 C) (Oral)   Resp 14   Wt 174 lb 3.2 oz (79 kg)   SpO2 96%   BMI 29.90 kg/m  Wt Readings from Last 3 Encounters:  08/13/17 174 lb 3.2 oz (79 kg)  08/10/17 177 lb (80.3 kg)  07/12/17 175 lb 9.6 oz (79.7 kg)     Lab Results  Component Value Date   WBC 5.3 07/12/2017   HGB 12.3 07/12/2017   HCT 37.4 07/12/2017   PLT 238.0 07/12/2017   GLUCOSE 152 (H) 07/08/2017   CHOL 215 (H) 07/08/2017   TRIG 199.0 (H) 07/08/2017   HDL 50.40 07/08/2017  LDLDIRECT 112.0 03/16/2017   LDLCALC 124 (H) 07/08/2017   ALT 16 07/08/2017   AST 22 07/08/2017   NA 140 07/08/2017   K 3.6 07/12/2017   CL 106 07/08/2017   CREATININE 1.08 07/08/2017   BUN 24 (H) 07/08/2017   CO2 26 07/08/2017   TSH 2.68 07/08/2017   HGBA1C 7.0 (H) 07/08/2017   MICROALBUR 3.5 (H) 03/16/2017    Mm Digital Screening Bilateral  Result Date: 01/11/2017 CLINICAL DATA:  Screening. EXAM: DIGITAL SCREENING BILATERAL MAMMOGRAM WITH CAD COMPARISON:  Previous exam(s). ACR Breast Density Category b: There are scattered areas of fibroglandular density. FINDINGS: There are no findings suspicious for malignancy. Images were processed with CAD. IMPRESSION: No mammographic evidence of malignancy. A result letter of this screening mammogram will be mailed directly to the patient. RECOMMENDATION: Screening mammogram in one year. (Code:SM-B-01Y) BI-RADS CATEGORY  1: Negative. Electronically Signed   By: Lajean Manes M.D.   On: 01/11/2017 12:14       Assessment & Plan:   Problem List Items Addressed This Visit    Acute febrile illness    Flu swab negative.  Concern regarding acute onset, fever, aching - regarding acute influenza.  Treat with tamiflu as directed.  She also has a history of frequent respiratory infections.  Will also cover with doxycycline to cover for possible acute bacterial infection as well.  Check cxr.  Use inhalers as discussed.  Nasal sprays as directed.  Rest. Fluids.  Follow closely.  If  any change or worsening symptoms, will be reevaluated.  Call with update.         Other Visit Diagnoses    Flu-like symptoms    -  Primary   Relevant Orders   POCT Influenza A/B (Completed)   Fever, unspecified fever cause       Relevant Orders   DG Chest 2 View (Completed)   Cough       Relevant Orders   DG Chest 2 View (Completed)       Einar Pheasant, MD

## 2017-08-13 NOTE — Patient Instructions (Signed)
Start your inhalers.    Robitussin DM as directed.    Take a probiotic daily while you are on your antibiotics and for two weeks after completing the antibiotic.

## 2017-08-15 ENCOUNTER — Encounter: Payer: Self-pay | Admitting: Internal Medicine

## 2017-08-15 DIAGNOSIS — R509 Fever, unspecified: Secondary | ICD-10-CM | POA: Insufficient documentation

## 2017-08-15 NOTE — Assessment & Plan Note (Signed)
Flu swab negative.  Concern regarding acute onset, fever, aching - regarding acute influenza.  Treat with tamiflu as directed.  She also has a history of frequent respiratory infections.  Will also cover with doxycycline to cover for possible acute bacterial infection as well.  Check cxr.  Use inhalers as discussed.  Nasal sprays as directed.  Rest. Fluids.  Follow closely.  If any change or worsening symptoms, will be reevaluated.  Call with update.

## 2017-09-07 ENCOUNTER — Ambulatory Visit (INDEPENDENT_AMBULATORY_CARE_PROVIDER_SITE_OTHER): Payer: PPO

## 2017-09-07 DIAGNOSIS — E538 Deficiency of other specified B group vitamins: Secondary | ICD-10-CM | POA: Diagnosis not present

## 2017-09-07 MED ORDER — CYANOCOBALAMIN 1000 MCG/ML IJ SOLN
1000.0000 ug | Freq: Once | INTRAMUSCULAR | Status: AC
  Administered 2017-09-07: 1000 ug via INTRAMUSCULAR

## 2017-09-07 NOTE — Progress Notes (Addendum)
Patient comes in for B 12 injection,.  Injected left deltoid.  Patient tolerated injection well.   Reviewed.  Dr Scott 

## 2017-09-10 ENCOUNTER — Other Ambulatory Visit: Payer: Self-pay

## 2017-09-13 ENCOUNTER — Other Ambulatory Visit: Payer: Self-pay

## 2017-09-13 ENCOUNTER — Encounter: Payer: Self-pay | Admitting: Gastroenterology

## 2017-09-13 ENCOUNTER — Ambulatory Visit (INDEPENDENT_AMBULATORY_CARE_PROVIDER_SITE_OTHER): Payer: PPO | Admitting: Gastroenterology

## 2017-09-13 VITALS — BP 131/82 | HR 81 | Temp 98.2°F | Ht 64.0 in | Wt 174.4 lb

## 2017-09-13 DIAGNOSIS — K648 Other hemorrhoids: Secondary | ICD-10-CM | POA: Insufficient documentation

## 2017-09-13 DIAGNOSIS — K529 Noninfective gastroenteritis and colitis, unspecified: Secondary | ICD-10-CM

## 2017-09-13 DIAGNOSIS — K625 Hemorrhage of anus and rectum: Secondary | ICD-10-CM

## 2017-09-13 DIAGNOSIS — K8681 Exocrine pancreatic insufficiency: Secondary | ICD-10-CM

## 2017-09-13 DIAGNOSIS — K641 Second degree hemorrhoids: Secondary | ICD-10-CM

## 2017-09-13 HISTORY — DX: Other hemorrhoids: K64.8

## 2017-09-13 MED ORDER — HYDROCORTISONE ACETATE 25 MG RE SUPP: 25.0000 mg | Freq: Two times a day (BID) | RECTAL | 0 refills | Status: DC

## 2017-09-13 MED ORDER — HYDROCORTISONE 2.5 % RE CREA: 1.0000 "application " | TOPICAL_CREAM | Freq: Two times a day (BID) | RECTAL | 1 refills | Status: DC

## 2017-09-13 MED ORDER — PANCRELIPASE (LIP-PROT-AMYL) 36000-114000 UNITS PO CPEP: ORAL_CAPSULE | ORAL | 2 refills | Status: DC

## 2017-09-13 NOTE — Progress Notes (Signed)
Rubber band ligation for symptomatic hemorrhoids was performed.  Please refer to the scanned visit note under media.  She has ongoing diarrhea, apparently she has been taking Creon 36,000 units with each meal.  Therefore, I suggested her to increase to 72,000 units with each meal and 30,000 units with snack  Cephas Darby, MD [address]  [place],  [address]  Main: [phone]  Fax: [phone] Pager: [phone]

## 2017-09-14 ENCOUNTER — Other Ambulatory Visit: Payer: Self-pay | Admitting: Gastroenterology

## 2017-09-20 DIAGNOSIS — Q6689 Other  specified congenital deformities of feet: Secondary | ICD-10-CM | POA: Diagnosis not present

## 2017-09-20 DIAGNOSIS — M7752 Other enthesopathy of left foot: Secondary | ICD-10-CM | POA: Diagnosis not present

## 2017-09-20 DIAGNOSIS — M7751 Other enthesopathy of right foot: Secondary | ICD-10-CM | POA: Diagnosis not present

## 2017-09-21 ENCOUNTER — Encounter: Payer: Self-pay | Admitting: Internal Medicine

## 2017-09-21 ENCOUNTER — Ambulatory Visit: Payer: PPO | Admitting: Internal Medicine

## 2017-09-21 VITALS — BP 124/68 | HR 86 | Temp 98.6°F | Resp 16 | Wt 176.2 lb

## 2017-09-21 DIAGNOSIS — R509 Fever, unspecified: Secondary | ICD-10-CM

## 2017-09-21 DIAGNOSIS — J069 Acute upper respiratory infection, unspecified: Secondary | ICD-10-CM | POA: Diagnosis not present

## 2017-09-21 DIAGNOSIS — E119 Type 2 diabetes mellitus without complications: Secondary | ICD-10-CM

## 2017-09-21 LAB — POC INFLUENZA A&B (BINAX/QUICKVUE)
INFLUENZA A, POC: NEGATIVE
Influenza B, POC: NEGATIVE

## 2017-09-21 MED ORDER — PREDNISONE 10 MG PO TABS
ORAL_TABLET | ORAL | 0 refills | Status: DC
Start: 2017-09-21 — End: 2017-12-02

## 2017-09-21 MED ORDER — DOXYCYCLINE HYCLATE 100 MG PO TABS: 100.0000 mg | ORAL_TABLET | Freq: Two times a day (BID) | ORAL | 0 refills | Status: DC

## 2017-09-21 NOTE — Patient Instructions (Signed)
Saline nasal spray - flush nose at least 2-3x/day  nasacort (or flonase) nasal spray - 2 sprays each nostril one time per day.  Do this in the evening.    mucinex in the am and robitussin DM in the pm.  Take the probiotic as we discussed.

## 2017-09-21 NOTE — Progress Notes (Signed)
Patient ID: Ashley Gross, female   DOB: Jul 19, 1951, 67 y.o.   MRN: [id number]   Subjective:    Patient ID: Ashley Gross, female    DOB: 02-23-1951, 66 y.o.   MRN: [id number]  HPI  Patient here as a work in with concerns regarding increased cough and congestion.  She was seen 08/13/17 and treated for flu like symptoms.  See note.  She reports symptoms completely resolved.  Over this past week, she started having some head fullness and congestion.  Chest feels tight.  Increased cough.  Tmax last night - 100.1.  Some sore throat from coughing.  Eating.  Some dry heaves with increased cough.  No other vomiting.  Diarrhea persistent and unchanged.  Saw Dr Marius Ditch.  Took nyquil last night.  Used neb this am.  Using inhalers regularly.     Past Medical History:  Diagnosis Date  . Anemia, unspecified   . Colitis   . Diabetes mellitus ([place])   . Esophagitis   . H/O hiatal hernia   . Hx of arteriovenous malformation (AVM)   . Hypertension   . IBS (irritable bowel syndrome)   . Nephrolithiasis   . Pernicious anemia   . Psoriatic arthritis ([facility])    s/p penicillamine, plaquenil, MTX, sulfasalazine.  s/p Embrel, Humira,.  Iritis.    . Pure hypercholesterolemia    Past Surgical History:  Procedure Laterality Date  . ABDOMINAL HYSTERECTOMY  1981   ovaries left in place  . ANKLE SURGERY     right  . BUNIONECTOMY    . CHOLECYSTECTOMY  1989  . HAND SURGERY     right  . HEEL SPUR SURGERY    . NOSE SURGERY     x2  . UMBILICAL HERNIA REPAIR     Family History  Problem Relation Age of Onset  . Diabetes Other   . Hypertension Other   . Breast cancer Neg Hx   . Colon cancer Neg Hx    Social History   Socioeconomic History  . Marital status: Married    Spouse name: None  . Number of children: 2  . Years of education: None  . Highest education level: None  Social Needs  . Financial resource strain: None  . Food insecurity - worry: None  . Food insecurity - inability: None  .  Transportation needs - medical: None  . Transportation needs - non-medical: None  Occupational History  . None  Tobacco Use  . Smoking status: Never Smoker  . Smokeless tobacco: Never Used  Substance and Sexual Activity  . Alcohol use: No    Alcohol/week: 0.0 oz  . Drug use: No  . Sexual activity: None  Other Topics Concern  . None  Social History Narrative   She is married and has two children    Outpatient Encounter Medications as of 09/21/2017  Medication Sig  . albuterol (ACCUNEB) 0.63 MG/3ML nebulizer solution Take 3 mLs (0.63 mg total) by nebulization every 6 (six) hours as needed for wheezing.  Marland Kitchen albuterol (PROVENTIL HFA;VENTOLIN HFA) 108 (90 BASE) MCG/ACT inhaler Inhale 2 puffs into the lungs every 6 (six) hours as needed.  Marland Kitchen amLODipine (NORVASC) 5 MG tablet take 1 tablet by mouth twice a day  . Calcium Carbonate-Vit D-Min (CALCIUM 600+D PLUS MINERALS) 600-400 MG-UNIT TABS Take by mouth.  . Cholecalciferol (VITAMIN D-3) 1000 UNITS CAPS Take 1 capsule by mouth daily.  . citalopram (CELEXA) 10 MG tablet TK 1 T PO ONCE D  . cyclobenzaprine (FLEXERIL)  5 MG tablet Take 1 tablet (5 mg total) by mouth at bedtime as needed for muscle spasms.  . diphenhydrAMINE (BENADRYL) 25 mg capsule Take by mouth.  . DOCOSAHEXAENOIC ACID PO Take by mouth.  . Fluticasone-Salmeterol (ADVAIR DISKUS) 250-50 MCG/DOSE AEPB Inhale into the lungs.  Marland Kitchen glipiZIDE (GLUCOTROL XL) 5 MG 24 hr tablet Take 1 tablet (5 mg total) by mouth daily.  Marland Kitchen ipratropium (ATROVENT HFA) 17 MCG/ACT inhaler Inhale 2 puffs into the lungs every 6 (six) hours.  . lipase/protease/amylase (CREON) 36000 UNITS CPEP capsule 2 capsules TID with meals 1 with snack  . lisinopril (PRINIVIL,ZESTRIL) 40 MG tablet take 1 tablet by mouth once daily  . loperamide (IMODIUM) 2 MG capsule Take by mouth.  . metFORMIN (GLUCOPHAGE) 1000 MG tablet take 1 tablet by mouth twice a day with meals  . omeprazole (PRILOSEC) 20 MG capsule take 1 capsule by  mouth once daily  . ONE TOUCH ULTRA TEST test strip PATIENT NEEDS NEW METER STRIPS AND LANCETS FOR ONE TOUCH. HER INSURANCE NO LONGER COVERS ACCUCHEK.  . pioglitazone (ACTOS) 15 MG tablet take 1 tablet by mouth once daily  . rosuvastatin (CRESTOR) 5 MG tablet Take 1 tablet (5 mg total) by mouth daily.  Marland Kitchen tiZANidine (ZANAFLEX) 2 MG tablet Take 1 tablet (2 mg total) by mouth at bedtime as needed for muscle spasms.  Marland Kitchen triamterene-hydrochlorothiazide (MAXZIDE-25) 37.5-25 MG tablet Take 0.5 tablets by mouth daily.  . [DISCONTINUED] hydrocortisone (ANUSOL-HC) 2.5 % rectal cream Place 1 application 2 (two) times daily rectally.  . dicyclomine (BENTYL) 20 MG tablet Take 1 tablet (20 mg total) by mouth every 6 (six) hours.  Marland Kitchen doxycycline (VIBRA-TABS) 100 MG tablet Take 1 tablet (100 mg total) 2 (two) times daily by mouth.  . predniSONE (DELTASONE) 10 MG tablet Take 6 tablets x 1 day and then decrease by 1/2 tablet per day until down to zero mg.  . [DISCONTINUED] benzonatate (TESSALON) 200 MG capsule Take 1 capsule (200 mg total) by mouth 3 (three) times daily as needed for cough.  . [DISCONTINUED] Calcium Carbonate-Vitamin D (CALTRATE 600+D) 600-400 MG-UNIT per tablet Take 1 tablet by mouth 2 (two) times daily.  . [DISCONTINUED] doxycycline (VIBRA-TABS) 100 MG tablet Take 1 tablet (100 mg total) by mouth 2 (two) times daily.  . [DISCONTINUED] hydrocortisone (ANUSOL-HC) 25 MG suppository Place 1 suppository (25 mg total) 2 (two) times daily rectally.  . [DISCONTINUED] oseltamivir (TAMIFLU) 75 MG capsule Take 1 capsule (75 mg total) by mouth 2 (two) times daily.  . [DISCONTINUED] triamterene-hydrochlorothiazide (MAXZIDE-25) 37.5-25 MG tablet take 1 tablet by mouth once daily   No facility-administered encounter medications on file as of 09/21/2017.     Review of Systems  Constitutional: Positive for fever. Negative for appetite change and unexpected weight change.  HENT: Positive for congestion, postnasal  drip, sinus pressure and sore throat.   Respiratory: Positive for cough and chest tightness. Negative for shortness of breath.   Cardiovascular: Negative for chest pain, palpitations and leg swelling.  Gastrointestinal: Positive for diarrhea. Negative for abdominal pain, nausea and vomiting.  Skin: Negative for color change and rash.  Neurological: Negative for dizziness.       Head fullness with increased congestion.         Objective:    Physical Exam  Constitutional: She appears well-developed and well-nourished. No distress.  HENT:  Mouth/Throat: Oropharynx is clear and moist.  Nares:  Slightly erythematous turbinates.    Neck: Neck supple.  Cardiovascular: Normal rate and  regular rhythm.  Pulmonary/Chest: Breath sounds normal. No respiratory distress. She has no wheezes.  Increased cough with expiration.    Abdominal: Soft. Bowel sounds are normal. There is no tenderness.  Musculoskeletal: She exhibits no edema or tenderness.  Lymphadenopathy:    She has no cervical adenopathy.  Skin: No rash noted. No erythema.  Psychiatric: She has a normal mood and affect. Her behavior is normal.    BP 124/68 (BP Location: Left Arm, Patient Position: Sitting, Cuff Size: Large)   Pulse 86   Temp 98.6 F (37 C) (Oral)   Resp 16   Wt 176 lb 3.2 oz (79.9 kg)   SpO2 98%   BMI 30.24 kg/m  Wt Readings from Last 3 Encounters:  09/21/17 176 lb 3.2 oz (79.9 kg)  09/13/17 174 lb 6.4 oz (79.1 kg)  08/13/17 174 lb 3.2 oz (79 kg)     Lab Results  Component Value Date   WBC 5.3 07/12/2017   HGB 12.3 07/12/2017   HCT 37.4 07/12/2017   PLT 238.0 07/12/2017   GLUCOSE 152 (H) 07/08/2017   CHOL 215 (H) 07/08/2017   TRIG 199.0 (H) 07/08/2017   HDL 50.40 07/08/2017   LDLDIRECT 112.0 03/16/2017   LDLCALC 124 (H) 07/08/2017   ALT 16 07/08/2017   AST 22 07/08/2017   NA 140 07/08/2017   K 3.6 07/12/2017   CL 106 07/08/2017   CREATININE 1.08 07/08/2017   BUN 24 (H) 07/08/2017   CO2 26  07/08/2017   TSH 2.68 07/08/2017   HGBA1C 7.0 (H) 07/08/2017   MICROALBUR 3.5 (H) 03/16/2017       Assessment & Plan:   Problem List Items Addressed This Visit    Diabetes mellitus ([place])    Discussed elevated sugars with prednisone.  Stay hydrated.  Follow sugars.         Other Visit Diagnoses    Fever, unspecified fever cause    -  Primary   Relevant Orders   POC Influenza A&B (Binax test) (Completed)   Upper respiratory tract infection, unspecified type       Symptoms as outlined.  Treat with prednisone taper and doxycycline as directed.  Continue nasal sprays and inhalers.  Neb as needed.  Mucinex.        Einar Pheasant, MD

## 2017-09-24 ENCOUNTER — Encounter: Payer: Self-pay | Admitting: Internal Medicine

## 2017-09-24 NOTE — Assessment & Plan Note (Signed)
Discussed elevated sugars with prednisone.  Stay hydrated.  Follow sugars.

## 2017-10-04 ENCOUNTER — Telehealth: Payer: Self-pay

## 2017-10-04 ENCOUNTER — Ambulatory Visit (INDEPENDENT_AMBULATORY_CARE_PROVIDER_SITE_OTHER): Payer: PPO | Admitting: Gastroenterology

## 2017-10-04 ENCOUNTER — Encounter: Payer: Self-pay | Admitting: Gastroenterology

## 2017-10-04 VITALS — BP 117/78 | HR 89 | Temp 99.2°F | Ht 64.0 in | Wt 177.0 lb

## 2017-10-04 DIAGNOSIS — K8681 Exocrine pancreatic insufficiency: Secondary | ICD-10-CM

## 2017-10-04 DIAGNOSIS — K529 Noninfective gastroenteritis and colitis, unspecified: Secondary | ICD-10-CM | POA: Diagnosis not present

## 2017-10-04 MED ORDER — DIPHENOXYLATE-ATROPINE 2.5-0.025 MG PO TABS: 2.0000 | ORAL_TABLET | Freq: Four times a day (QID) | ORAL | 0 refills | Status: AC

## 2017-10-04 NOTE — Telephone Encounter (Signed)
Patient has been given samples of zenpep (Purple Box) 2 boxes to try to see if this helps with her diarrhea.   Rx for Lomotil has been called to pharmacy 2 tablets 4 times qty 240.  Rx for Nitroglycerin has been called to [facility].  Thanks [employer]

## 2017-10-04 NOTE — Progress Notes (Signed)
PROCEDURE NOTE: The patient presents with symptomatic grade 2 hemorrhoids, unresponsive to maximal medical therapy, requesting rubber band ligation of his/her hemorrhoidal disease.  All risks, benefits and alternative forms of therapy were described and informed consent was obtained.   The decision was made to band the RA internal hemorrhoid, and the [place] was used to perform band ligation without complication.  Digital anorectal examination was then performed to assure proper positioning of the band, and to adjust the banded tissue as required.  The patient was discharged home without pain or other issues.  Dietary and behavioral recommendations were given and (if necessary - prescriptions were given), along with follow-up instructions.  The patient will return 6 weeks for follow-up and possible additional banding as required.  No complications were encountered and the patient tolerated the procedure well.  Cephas Darby, MD [address]  [place]  [place], [place] [address]  Main: [phone]  Fax: [phone] Pager: [phone]

## 2017-10-04 NOTE — Progress Notes (Signed)
Pt increased creon to 72K TID with meal and 36K with snack. She is still c/o diarrhea. Following low fat diet  Will try zenpep Start lomotil 2pills QID Last colonoscopy with biopsies normal F/u in 6weeks  Cephas Darby, MD [address]  [place]  [place], [place] [address]  Main: [phone]  Fax: [phone] Pager: [phone]

## 2017-10-07 ENCOUNTER — Other Ambulatory Visit: Payer: Self-pay | Admitting: Internal Medicine

## 2017-10-12 ENCOUNTER — Ambulatory Visit: Payer: PPO

## 2017-10-13 ENCOUNTER — Other Ambulatory Visit: Payer: Self-pay

## 2017-10-13 ENCOUNTER — Other Ambulatory Visit: Payer: Self-pay | Admitting: Internal Medicine

## 2017-10-13 MED ORDER — PANCRELIPASE (LIP-PROT-AMYL) 40000-126000 UNITS PO CPEP: 1.0000 | ORAL_CAPSULE | ORAL | 2 refills | Status: DC

## 2017-10-19 ENCOUNTER — Ambulatory Visit (INDEPENDENT_AMBULATORY_CARE_PROVIDER_SITE_OTHER): Payer: PPO

## 2017-10-19 DIAGNOSIS — E538 Deficiency of other specified B group vitamins: Secondary | ICD-10-CM | POA: Diagnosis not present

## 2017-10-19 MED ORDER — CYANOCOBALAMIN 1000 MCG/ML IJ SOLN
1000.0000 ug | Freq: Once | INTRAMUSCULAR | Status: AC
  Administered 2017-10-19: 1000 ug via INTRAMUSCULAR

## 2017-10-19 NOTE — Progress Notes (Addendum)
Pt presented today for a B12 injection. Right deltoid. Pt voiced no concerns nor showed any signs of distress during the injection.   Reviewed.  Dr Nicki Reaper

## 2017-10-20 ENCOUNTER — Ambulatory Visit: Payer: PPO | Admitting: Internal Medicine

## 2017-11-15 NOTE — Progress Notes (Deleted)
[facility] Consultation      Assessment and Plan:  Asthmatic bronchitis.  -Acute bronchitis, likely viral in etiology, with prolonged episode of asthmatic bronchitis. -Continue Flovent inhaler, will add utibron inhaler to be used once daily for the duration of the sample.  -She is asked to call back in 3-4 weeks. If not doing better for earlier follow-up.  Cough. -Secondary to above, will add Tessalon 200 mg 3 times daily for 1 month. -I've advised her that this episode may last up to 3 months after the initial insult, but should be trailing off over the next few weeks.  Asthma.  -Overall her chronic asthma appears to be well controlled, however, she appears to have flareups with acute bronchitis a few times a year, we'll need to workup further. Once this acute episode is resolved. I have asked her to follow-up in approximately 3 months time.  Excessive daytime sleepiness --Symptoms and signs of obstructive sleep apnea.   Date: 11/15/2017  MRN# [id number] Ashley Gross 1951/03/02    Ashley Gross is a 67 y.o. old female seen in consultation for chief complaint of:    No chief complaint on file.   HPI:  The patient is a 67 yo female who was last seen with symptoms of acute on chronic bronchitis. She was having cough, taking codeine cough syrup at night for sleep. She is currently taking flovent 110;  2 puffs twice daily and feels that it helps. She has taken tessalon in the past. Before she got sick she did not use the nebulizer.  She has no pets at home, non smoker.   In addition her husband has noted that she wakes up choking and takes a few seconds before she can catch her breath. This happens a few times per year. She had a sleep study in the past a few years ago, which was normal. Denies reflux or heartburn.   Social Hx:   Social History   Tobacco Use  . Smoking status: Never Smoker  . Smokeless tobacco: Never Used  Substance Use Topics  .  Alcohol use: No    Alcohol/week: 0.0 oz  . Drug use: No   Medication:   Reviewed    Allergies:  Aspirin; Beta adrenergic blockers; Cobalamine combinations; Daypro [oxaprozin]; Enalapril maleate; Erythromycin; Indocin [indomethacin]; Mtx support [cobalamine combinations]; Vasotec [enalapril]; Verapamil; and Voltaren [diclofenac sodium]  Review of Systems: Gen:  Denies  fever, sweats, chills HEENT: Denies blurred vision, double vision. bleeds, sore throat Cvc:  No dizziness, chest pain. Resp:   Denies cough or sputum production, shortness of breath Gi: Denies swallowing difficulty, stomach pain. Gu:  Denies bladder incontinence, burning urine Ext:   No Joint pain, stiffness. Skin: No skin rash,  hives  Endoc:  No polyuria, polydipsia. Psych: No depression, insomnia. Other:  All other systems were reviewed with the patient and were negative other that what is mentioned in the HPI.   Physical Examination:   VS: There were no vitals taken for this visit.  General Appearance: No distress  Neuro:without focal findings,  speech normal,  HEENT: PERRLA, EOM intact.   Pulmonary: normal breath sounds, No wheezing.  CardiovascularNormal S1,S2.  No m/r/g.   Abdomen: Benign, Soft, non-tender. Renal:  No costovertebral tenderness  GU:  No performed at this time. Endoc: No evident thyromegaly, no signs of acromegaly. Skin:   warm, no rashes, no ecchymosis  Extremities: normal, no cyanosis, clubbing.  Other findings:    LABORATORY PANEL:  CBC No results for input(s): WBC, HGB, HCT, PLT in the last 168 hours. ------------------------------------------------------------------------------------------------------------------  Chemistries  No results for input(s): NA, K, CL, CO2, GLUCOSE, BUN, CREATININE, CALCIUM, MG, AST, ALT, ALKPHOS, BILITOT in the last 168 hours.  Invalid input(s):  GFRCGP ------------------------------------------------------------------------------------------------------------------  Cardiac Enzymes No results for input(s): TROPONINI in the last 168 hours. ------------------------------------------------------------  RADIOLOGY:  No results found.     Thank  you for the consultation and for allowing [facility] to assist in the care of your patient. Our recommendations are noted above.  Please contact us if we can be of further service.   Marda Stalker, MD.  Board Certified in Internal Medicine, Pulmonary Medicine, [place], and Sleep Medicine.  [facility] Office Number: [phone]  Patricia Pesa, M.D.  Merton Border, M.D  11/15/2017

## 2017-11-16 ENCOUNTER — Ambulatory Visit: Payer: PPO | Admitting: Internal Medicine

## 2017-11-18 ENCOUNTER — Other Ambulatory Visit: Payer: Self-pay

## 2017-11-19 ENCOUNTER — Encounter: Payer: Self-pay | Admitting: Gastroenterology

## 2017-11-19 ENCOUNTER — Other Ambulatory Visit: Payer: Self-pay

## 2017-11-19 ENCOUNTER — Ambulatory Visit: Payer: PPO | Admitting: Gastroenterology

## 2017-11-19 VITALS — BP 123/85 | HR 81 | Temp 98.2°F | Ht 64.0 in | Wt 177.6 lb

## 2017-11-19 DIAGNOSIS — R197 Diarrhea, unspecified: Secondary | ICD-10-CM

## 2017-11-19 DIAGNOSIS — K8681 Exocrine pancreatic insufficiency: Secondary | ICD-10-CM | POA: Diagnosis not present

## 2017-11-19 DIAGNOSIS — K642 Third degree hemorrhoids: Secondary | ICD-10-CM | POA: Diagnosis not present

## 2017-11-19 DIAGNOSIS — K625 Hemorrhage of anus and rectum: Secondary | ICD-10-CM

## 2017-11-19 MED ORDER — PANCRELIPASE (LIP-PROT-AMYL) 40000-126000 UNITS PO CPEP: 2.0000 | ORAL_CAPSULE | ORAL | 2 refills | Status: DC

## 2017-11-19 NOTE — Progress Notes (Signed)
PROCEDURE NOTE: The patient presents with symptomatic grade 3 hemorrhoids, unresponsive to maximal medical therapy, requesting rubber band ligation of his/her hemorrhoidal disease.  All risks, benefits and alternative forms of therapy were described and informed consent was obtained.   The decision was made to band the RP internal hemorrhoid, and the [place] was used to perform band ligation without complication.  Digital anorectal examination was then performed to assure proper positioning of the band, and to adjust the banded tissue as required.  The patient was discharged home without pain or other issues.  Dietary and behavioral recommendations were given and (if necessary - prescriptions were given), along with follow-up instructions.  The patient will return 4 weeks for follow-up and possible additional banding as required.  No complications were encountered and the patient tolerated the procedure well.   Cephas Darby, MD [address]  [place]  [place], [place] [address]  Main: [phone]  Fax: [phone] Pager: [phone]

## 2017-11-19 NOTE — Progress Notes (Signed)
Cephas Darby, MD [address]  [place]  [place], [place] [address]  Main: [phone]  Fax: [phone]    Gastroenterology Consultation  Referring Provider:     Einar Pheasant, MD Primary Care Physician:  Einar Pheasant, MD Primary Gastroenterologist:  Dr. Cephas Darby Reason for Consultation:     Rectal bleeding and chronic diarrhea        HPI:   Ashley Gross is a 67 y.o. white female referred by Dr. Einar Pheasant, MD  for consultation & management of chronic diarrhea and rectal bleeding. She has known h/o moderate exocrine EPI, IBS-D, inflammatory polyps in colon was originally seeing Dr Redmond Pulling at [facility] until 2014. She is referred here due to change in her insurance. Has not seen Dr Redmond Pulling since 2014. She had extensive work up including EGD/colonoscopy/VCE and unremarkable biopsies. TTG IgA negative, stool WBCs negative, infectious work up negative, normal serum gastrin. Pancreatic elastase levels were 149 in 01/2013. She was on creon in the past which helped, stopped several years ago. Other medications she tried were cholestyramine, imodium, levsin, dicyclomine 20mg .  She had cholecystectomy several years ago. She had MRI pancreas protocol in 05/2013 which showed normal pancreas  She reports several years h/o chronic non-bloody diarrhea anywhere from 5to10BMs/day or upto 20/day during exacerbations. Denies nocturnal diarrhea or leakage or incontinence. She has been experiencing rectal bleeding since Feb or march 2018, once every 1-2 weeks. Currently, experiencing rectal bleeding every day since 3 months. Seeing blood mixed with stools and sometimes only with mucus. She showed pictures of these episodes on her phone. Her BMs are always loose, 5-6 times/day or even more. She does have severe abdominal cramps a/w diarrhea and rectal bleeding. She lost weight in the past, stable in last 1-2 yrs. She denies n/v/f/c. Her diabetes is controlled. She can't tolerate most of the  foods, has BM immediately after eating. She denies bloating. She has not tried low-FODMAPs diet She had symptomatic hemorrhoids in the past, previously itchy, painful. She denies these symptoms at present other than internal hemorrhoids. She takes PPI as needed only. She denies tobacco, ETOH, NSAID use She does not have anemia  She has h/o psoriatic arthritis, previously on remicade (developed severe myalgias), plaquenil, pencillamine, humira, embrel. Off therapy since 04/2013  Follow-up visit 11/19/2017: She is currently being treated for rectal bleeding with ligation of internal hemorrhoids. I changed from Creon to zenpep, also added Lomotil 2pills 4 times daily. She is here today for follow-up of banding. She continues to have about 5-8 times of nonbloody bowel movements per day. Reports that rectal bleeding is significantly better, still occurs about twice daily.  GI Procedures:  Colonoscopy 04/06/2007 Indications:last colonoscopy 2001, anemia. Impression: -Congested mucosa in the entire colon. -Nodules in the proximal ascending colon, biopsied. -Nodule in the distal ascending colon, biopsied. -Congested erythematous and friable (with contact bleeding) mucosa in the  ascending colon and in the cecum. . "CECUM" (ENDOSCOPIC BIOPSY) COLONIC MUCOSA WITH NO PATHOLOGIC DIAGNOSIS. NO ACTIVE OR CHRONIC COLITIS IS SEEN. NO EVIDENCE OF LYMPHOCYTIC OR COLLAGENOUS COLITIS IS SEEN. B. "RIGHT COLON NODULES" (ENDOSCOPIC BIOPSY): INFLAMMATORY POLYPS. C. "RECTOSIGMOID COLON" (ENDOSCOPIC BIOPSY): COLONIC MUCOSA WITH NO PATHOLOGIC DIAGNOSIS. NO ACTIVE OR CHRONIC COLITIS IS SEEN. NO EVIDENCE OF LYMPHOCYTIC OR COLLAGENOUS COLITIS IS SEEN. D. "DISTAL ASCENDING COLON" (ENDOSCOPIC BIOPSY): INFLAMMATORY POLYPS. ------------------------------------------------------------------------------------------------- Colonoscopy 2014  A. "ILEUM" (ENDOSCOPIC BIOPSY): SMALL  INTESTINAL MUCOSA WITH NO PATHOLOGIC DIAGNOSIS. NO ACTIVE OR CHRONIC ENTERITIS IS SEEN. NO GRANULOMATOUS INFLAMMATION IS  SEEN. B. "COLON, ILEOCECAL VALVE NODULE" (BIOPSY): COLONIC MUCOSA, WITH REACTIVE CHANGES. NO ADENOMATOUS MUCOSA IS IDENTIFIED. C. "RIGHT COLON" (ENDOSCOPIC BIOPSY): COLONIC MUCOSA WITH NO PATHOLOGIC DIAGNOSIS. NO ACTIVE OR CHRONIC COLITIS IS SEEN. NO EVIDENCE OF LYMPHOCYTIC OR COLLAGENOUS COLITIS IS SEEN. D. "HEPATIC FLEXURE MASS" (ENDOSCOPIC BIOPSY): INFLAMMATORY POLYP. NO ADENOMATOUS MUCOSA IS SEEN. E. "TRANSVERSE COLON" (ENDOSCOPIC BIOPSY): COLONIC MUCOSA WITH NO PATHOLOGIC DIAGNOSIS. NO ACTIVE OR CHRONIC COLITIS IS SEEN. NO EVIDENCE OF LYMPHOCYTIC OR COLLAGENOUS COLITIS IS SEEN. F. "LEFT COLON" (ENDOSCOPIC BIOPSY): COLONIC MUCOSA WITH NO PATHOLOGIC DIAGNOSIS. NO ACTIVE OR CHRONIC COLITIS IS SEEN. NO EVIDENCE OF LYMPHOCYTIC OR COLLAGENOUS COLITIS IS SEEN. A MINUTE HYPERPLASTIC POLYP IS ALSO PRESENT. G. "LEFT COLON POLYP(S)" (ENDOSCOPIC POLYPECTOMY): TUBULAR ADENOMA. NO HIGH GRADE DYSPLASIA OR CARCINOMA IS SEEN. Past Medical History:  Diagnosis Date  . Anemia, unspecified   . Colitis   . Diabetes mellitus ([place])   . Esophagitis   . H/O hiatal hernia   . Hx of arteriovenous malformation (AVM)   . Hypertension   . IBS (irritable bowel syndrome)   . Nephrolithiasis   . Pernicious anemia   . Psoriatic arthritis ([facility])    s/p penicillamine, plaquenil, MTX, sulfasalazine.  s/p Embrel, Humira,.  Iritis.    . Pure hypercholesterolemia     Past Surgical History:  Procedure Laterality Date  . ABDOMINAL HYSTERECTOMY  1981   ovaries left in place  . ANKLE SURGERY     right  . BUNIONECTOMY    . CHOLECYSTECTOMY  1989  . HAND SURGERY     right  . HEEL SPUR SURGERY    . NOSE SURGERY     x2  . UMBILICAL HERNIA REPAIR       Current Outpatient Medications:  .  albuterol (ACCUNEB) 0.63  MG/3ML nebulizer solution, Take 3 mLs (0.63 mg total) by nebulization every 6 (six) hours as needed for wheezing., Disp: 75 mL, Rfl: 1 .  amLODipine (NORVASC) 5 MG tablet, take 1 tablet by mouth twice a day, Disp: 60 tablet, Rfl: 11 .  Calcium Carbonate-Vit D-Min (CALCIUM 600+D PLUS MINERALS) 600-400 MG-UNIT TABS, Take by mouth., Disp: , Rfl:  .  Cholecalciferol (VITAMIN D-3) 1000 UNITS CAPS, Take 1 capsule by mouth daily., Disp: , Rfl:  .  citalopram (CELEXA) 10 MG tablet, TK 1 T PO ONCE D, Disp: , Rfl: 1 .  cyclobenzaprine (FLEXERIL) 5 MG tablet, Take 1 tablet (5 mg total) by mouth at bedtime as needed for muscle spasms., Disp: 30 tablet, Rfl: 0 .  dicyclomine (BENTYL) 20 MG tablet, Take 1 tablet (20 mg total) by mouth every 6 (six) hours., Disp: 120 tablet, Rfl: 0 .  diphenhydrAMINE (BENADRYL) 25 mg capsule, Take by mouth., Disp: , Rfl:  .  DOCOSAHEXAENOIC ACID PO, Take by mouth., Disp: , Rfl:  .  doxycycline (VIBRA-TABS) 100 MG tablet, Take 1 tablet (100 mg total) 2 (two) times daily by mouth., Disp: 20 tablet, Rfl: 0 .  Fluticasone-Salmeterol (ADVAIR DISKUS) 250-50 MCG/DOSE AEPB, Inhale into the lungs., Disp: , Rfl:  .  glipiZIDE (GLUCOTROL XL) 5 MG 24 hr tablet, Take 1 tablet (5 mg total) by mouth daily., Disp: 90 tablet, Rfl: 3 .  ipratropium (ATROVENT HFA) 17 MCG/ACT inhaler, Inhale 2 puffs into the lungs every 6 (six) hours., Disp: , Rfl:  .  lisinopril (PRINIVIL,ZESTRIL) 40 MG tablet, TAKE 1 TABLET BY MOUTH ONCE DAILY, Disp: 30 tablet, Rfl: 0 .  loperamide (IMODIUM) 2 MG capsule, Take by mouth., Disp: , Rfl:  .  metFORMIN (GLUCOPHAGE) 1000 MG tablet, take 1 tablet by mouth twice a day with meals, Disp: 60 tablet, Rfl: 11 .  omeprazole (PRILOSEC) 20 MG capsule, take 1 capsule by mouth once daily, Disp: 30 capsule, Rfl: 11 .  ONE TOUCH ULTRA TEST test strip, PATIENT NEEDS NEW METER STRIPS AND LANCETS FOR ONE TOUCH. HER INSURANCE NO LONGER COVERS ACCUCHEK., Disp: 100 each, Rfl: PRN .   Pancrelipase, Lip-Prot-Amyl, 40000-126000 units CPEP, Take 2 capsules by mouth as directed. 2 cap with first bite, 1 cap with snack, Disp: 200 capsule, Rfl: 2 .  pioglitazone (ACTOS) 15 MG tablet, take 1 tablet by mouth once daily, Disp: 30 tablet, Rfl: 11 .  predniSONE (DELTASONE) 10 MG tablet, Take 6 tablets x 1 day and then decrease by 1/2 tablet per day until down to zero mg., Disp: 39 tablet, Rfl: 0 .  PROCTOZONE-HC 2.5 % rectal cream, , Disp: , Rfl:  .  rosuvastatin (CRESTOR) 5 MG tablet, TAKE 1 TABLET(5 MG) BY MOUTH DAILY, Disp: 90 tablet, Rfl: 0 .  tiZANidine (ZANAFLEX) 2 MG tablet, Take 1 tablet (2 mg total) by mouth at bedtime as needed for muscle spasms., Disp: 30 tablet, Rfl: 0 .  triamterene-hydrochlorothiazide (MAXZIDE-25) 37.5-25 MG tablet, Take 0.5 tablets by mouth daily., Disp: 30 tablet, Rfl: 11   Family History  Problem Relation Age of Onset  . Diabetes Other   . Hypertension Other   . Breast cancer Neg Hx   . Colon cancer Neg Hx      Social History   Tobacco Use  . Smoking status: Never Smoker  . Smokeless tobacco: Never Used  Substance Use Topics  . Alcohol use: No    Alcohol/week: 0.0 oz  . Drug use: No    Allergies as of 11/19/2017 - Review Complete 11/19/2017  Allergen Reaction Noted  . Aspirin Other (See Comments) 10/06/2012  . Beta adrenergic blockers  10/06/2012  . Cobalamine combinations Other (See Comments) 11/18/2015  . Daypro [oxaprozin]  10/06/2012  . Enalapril maleate Other (See Comments) 11/18/2015  . Erythromycin Other (See Comments) 10/06/2012  . Indocin [indomethacin]  10/06/2012  . Mtx support [cobalamine combinations] Other (See Comments) 10/06/2012  . Vasotec [enalapril] Cough 10/06/2012  . Verapamil Other (See Comments) 10/06/2012  . Voltaren [diclofenac sodium]  10/06/2012    Review of Systems:    All systems reviewed and negative except where noted in HPI.   Physical Exam:  BP 123/85   Pulse 81   Temp 98.2 F (36.8 C) (Oral)    Ht 5\' 4"  (1.626 m)   Wt 177 lb 9.6 oz (80.6 kg)   BMI 30.48 kg/m  No LMP recorded. Patient has had a hysterectomy.  General:   Alert,  Well-developed, well-nourished, pleasant and cooperative in NAD Head:  Normocephalic and atraumatic. Eyes:  Sclera clear, no icterus.   Conjunctiva pink. Ears:  Normal auditory acuity. Nose:  No deformity, discharge, or lesions. Mouth:  No deformity or lesions,oropharynx pink & moist. Neck:  Supple; no masses or thyromegaly. Lungs:  Respirations even and unlabored.  Clear throughout to auscultation.   No wheezes, crackles, or rhonchi. No acute distress. Heart:  Regular rate and rhythm; no murmurs, clicks, rubs, or gallops. Abdomen:  Normal bowel sounds.  Obese, Soft, non-tender and non-distended without masses, hepatosplenomegaly or hernias noted.  No guarding or rebound tenderness.   Rectal: revealed large internal hemorrhoids, brown stool on glove Msk:  Symmetrical without gross deformities. Good, equal movement & strength bilaterally. Pulses:  Normal  pulses noted. Extremities:  No clubbing or edema.  No cyanosis. Neurologic:  Alert and oriented x3;  grossly normal neurologically. Skin:  Intact without significant lesions or rashes. No jaundice. Psych:  Alert and cooperative. Normal mood and affect.  Imaging Studies: Reviewed  Assessment and Plan:   Ashley Gross is a 67 y.o. white female with h/o psoriatic arthritis, moderate EPI, IBS-D, inflammatory polyps in colon presents for f/u of rectal bleeding and chronic diarrhea. She does have large internal hemorrhoids for which she is undergoing treatment. Had extensive workup in the past which is negative for gastrinoma, IBD, microscopic colitis, celiac disease, chronic infections. Still with ongoing diarrhea, slightly improved since changing from creon to zenpep and added lomotil  - Recheck C Diff and GI pathogen panel - CRP - Zenpep 80K TID and 40K with snack - Continue lomotil 2pills 3-4  times daily - Dicyclomine as needed - If stool studies are negative, I'll give her empiric trial of rifaximin 550 mg 3 times daily for 2 weeks for possible SIBO - Perform hemorrhoid banding today   Follow up in 4weeks   Cephas Darby, MD

## 2017-11-19 NOTE — Progress Notes (Deleted)
Cephas Darby, MD [address]  [place]  [place], [place] [address]  Main: [phone]  Fax: [phone] Pager: [phone]   Primary Care Physician: Einar Pheasant, MD  Primary Gastroenterologist:  Dr. Cephas Darby  Chief Complaint  Patient presents with  . Hemorrhoids    Banding procedure #3    HPI: KRISSI WILLAIMS is a 67 y.o. female  Current Outpatient Medications  Medication Sig Dispense Refill  . albuterol (ACCUNEB) 0.63 MG/3ML nebulizer solution Take 3 mLs (0.63 mg total) by nebulization every 6 (six) hours as needed for wheezing. 75 mL 1  . amLODipine (NORVASC) 5 MG tablet take 1 tablet by mouth twice a day 60 tablet 11  . Calcium Carbonate-Vit D-Min (CALCIUM 600+D PLUS MINERALS) 600-400 MG-UNIT TABS Take by mouth.    . Cholecalciferol (VITAMIN D-3) 1000 UNITS CAPS Take 1 capsule by mouth daily.    . citalopram (CELEXA) 10 MG tablet TK 1 T PO ONCE D  1  . cyclobenzaprine (FLEXERIL) 5 MG tablet Take 1 tablet (5 mg total) by mouth at bedtime as needed for muscle spasms. 30 tablet 0  . dicyclomine (BENTYL) 20 MG tablet Take 1 tablet (20 mg total) by mouth every 6 (six) hours. 120 tablet 0  . diphenhydrAMINE (BENADRYL) 25 mg capsule Take by mouth.    . DOCOSAHEXAENOIC ACID PO Take by mouth.    . doxycycline (VIBRA-TABS) 100 MG tablet Take 1 tablet (100 mg total) 2 (two) times daily by mouth. 20 tablet 0  . Fluticasone-Salmeterol (ADVAIR DISKUS) 250-50 MCG/DOSE AEPB Inhale into the lungs.    Marland Kitchen glipiZIDE (GLUCOTROL XL) 5 MG 24 hr tablet Take 1 tablet (5 mg total) by mouth daily. 90 tablet 3  . ipratropium (ATROVENT HFA) 17 MCG/ACT inhaler Inhale 2 puffs into the lungs every 6 (six) hours.    Marland Kitchen lisinopril (PRINIVIL,ZESTRIL) 40 MG tablet TAKE 1 TABLET BY MOUTH ONCE DAILY 30 tablet 0  . loperamide (IMODIUM) 2 MG capsule Take by mouth.    . metFORMIN (GLUCOPHAGE) 1000 MG tablet take 1 tablet by mouth twice a day with meals 60 tablet 11  . omeprazole (PRILOSEC)  20 MG capsule take 1 capsule by mouth once daily 30 capsule 11  . ONE TOUCH ULTRA TEST test strip PATIENT NEEDS NEW METER STRIPS AND LANCETS FOR ONE TOUCH. HER INSURANCE NO LONGER COVERS ACCUCHEK. 100 each PRN  . Pancrelipase, Lip-Prot-Amyl, 40000-126000 units CPEP Take 1 capsule by mouth as directed. 1 cap Before meals, 1/2 tab. Before snacks 120 capsule 2  . pioglitazone (ACTOS) 15 MG tablet take 1 tablet by mouth once daily 30 tablet 11  . predniSONE (DELTASONE) 10 MG tablet Take 6 tablets x 1 day and then decrease by 1/2 tablet per day until down to zero mg. 39 tablet 0  . PROCTOZONE-HC 2.5 % rectal cream     . rosuvastatin (CRESTOR) 5 MG tablet TAKE 1 TABLET(5 MG) BY MOUTH DAILY 90 tablet 0  . tiZANidine (ZANAFLEX) 2 MG tablet Take 1 tablet (2 mg total) by mouth at bedtime as needed for muscle spasms. 30 tablet 0  . triamterene-hydrochlorothiazide (MAXZIDE-25) 37.5-25 MG tablet Take 0.5 tablets by mouth daily. 30 tablet 11   No current facility-administered medications for this visit.     Allergies as of 11/19/2017 - Review Complete 11/19/2017  Allergen Reaction Noted  . Aspirin Other (See Comments) 10/06/2012  . Beta adrenergic blockers  10/06/2012  . Cobalamine combinations Other (See Comments) 11/18/2015  . Daypro [  oxaprozin]  10/06/2012  . Enalapril maleate Other (See Comments) 11/18/2015  . Erythromycin Other (See Comments) 10/06/2012  . Indocin [indomethacin]  10/06/2012  . Mtx support [cobalamine combinations] Other (See Comments) 10/06/2012  . Vasotec [enalapril] Cough 10/06/2012  . Verapamil Other (See Comments) 10/06/2012  . Voltaren [diclofenac sodium]  10/06/2012    NSAIDs: ***  Antiplts/Anticoagulants/Anti thrombotics: ***  GI procedures: ***  ROS:  General: Negative for anorexia, weight loss, fever, chills, fatigue, weakness. ENT: Negative for hoarseness, difficulty swallowing , nasal congestion. CV: Negative for chest pain, angina, palpitations, dyspnea on  exertion, peripheral edema.  Respiratory: Negative for dyspnea at rest, dyspnea on exertion, cough, sputum, wheezing.  GI: See history of present illness. GU:  Negative for dysuria, hematuria, urinary incontinence, urinary frequency, nocturnal urination.  Endo: Negative for unusual weight change.    Physical Examination:   BP 123/85   Pulse 81   Temp 98.2 F (36.8 C) (Oral)   Ht 5\' 4"  (1.626 m)   Wt 177 lb 9.6 oz (80.6 kg)   BMI 30.48 kg/m   General: Well-nourished, well-developed in no acute distress.  Eyes: No icterus. Conjunctivae pink. Mouth: Oropharyngeal mucosa moist and pink , no lesions erythema or exudate. Lungs: Clear to auscultation bilaterally. Non-labored. Heart: Regular rate and rhythm, no murmurs rubs or gallops.  Abdomen: Bowel sounds are normal, nontender, nondistended, no hepatosplenomegaly or masses, no hernia , no rebound or guarding.   Extremities: No lower extremity edema. No clubbing or deformities. Neuro: Alert and oriented x 3.  Grossly intact. Skin: Warm and dry, no jaundice.   Psych: Alert and cooperative, normal mood and affect.   Imaging Studies: No results found.  Assessment and Plan:   SENTORIA BRENT is a 67 y.o. female ***  Follow up in ***   Dr Sherri Sear, MD

## 2017-11-23 ENCOUNTER — Ambulatory Visit (INDEPENDENT_AMBULATORY_CARE_PROVIDER_SITE_OTHER): Payer: PPO | Admitting: *Deleted

## 2017-11-23 DIAGNOSIS — E538 Deficiency of other specified B group vitamins: Secondary | ICD-10-CM | POA: Diagnosis not present

## 2017-11-23 MED ORDER — CYANOCOBALAMIN 1000 MCG/ML IJ SOLN
1000.0000 ug | Freq: Once | INTRAMUSCULAR | Status: AC
  Administered 2017-11-23: 1000 ug via INTRAMUSCULAR

## 2017-11-23 NOTE — Progress Notes (Addendum)
Patient presented for B 12 injection to left deltoid, patient voiced no concerns nor showed any signs of distress during injection.  Reviewed.  Dr Scott 

## 2017-11-29 DIAGNOSIS — R197 Diarrhea, unspecified: Secondary | ICD-10-CM | POA: Diagnosis not present

## 2017-11-30 ENCOUNTER — Other Ambulatory Visit: Payer: Self-pay | Admitting: Gastroenterology

## 2017-11-30 DIAGNOSIS — Z7689 Persons encountering health services in other specified circumstances: Secondary | ICD-10-CM | POA: Diagnosis not present

## 2017-12-01 LAB — C-REACTIVE PROTEIN: CRP: 3.2 mg/L (ref 0.0–4.9)

## 2017-12-02 ENCOUNTER — Telehealth: Payer: Self-pay | Admitting: Gastroenterology

## 2017-12-02 ENCOUNTER — Encounter: Payer: Self-pay | Admitting: Gastroenterology

## 2017-12-02 DIAGNOSIS — K580 Irritable bowel syndrome with diarrhea: Secondary | ICD-10-CM

## 2017-12-02 LAB — CLOSTRIDIUM DIFFICILE EIA: C DIFFICILE TOXINS A+ B, EIA: NEGATIVE

## 2017-12-02 LAB — PANCREATIC ELASTASE, FECAL: Pancreatic Elastase, Fecal: >500 ug Elast./g (ref >200)

## 2017-12-02 MED ORDER — RIFAXIMIN 550 MG PO TABS: 550.0000 mg | ORAL_TABLET | Freq: Three times a day (TID) | ORAL | 0 refills | Status: AC

## 2017-12-02 NOTE — Telephone Encounter (Addendum)
Called patient to discuss about results. Her pancreatic fecal elastase came back normal. That means she does not have exocrine pancreatic insufficiency. Her pancreatic fecal elastase was indeed within normal limits back in 2014. CRP, stool for C. Difficile came back normal. She continues to have diarrhea with abdominal cramps as well as rectal bleeding. She is taking probiotics, Lomotil, dicyclomine, pancreatic enzymes to control diarrhea. She stopped artificial sweeteners. she is on glipizide and pioglitazone for diabetes. She stopped metformin long time ago.  My recommendations are -Stop pancreatic enzymes -stop probiotics -increase Lomotil to 4 times a day -Start Imodium and alternate with Lomotil -Continue dicyclomine at least twice daily -Avoid pork, asked her to continue chicken and fish -I will prescribe her rifaximin 550 mg 3 times daily for 2 weeks for IBS-diarrhea -perform EGD with gastric and duodenal biopsies -Performed colonoscopy given history of 40 mm inflammatory polyp in right colon that was not resected and history of tubular adenoma in 2014.  Patient voiced understanding of my recommendations  Sherri Sear, M.D.

## 2017-12-03 ENCOUNTER — Telehealth: Payer: Self-pay

## 2017-12-03 DIAGNOSIS — R945 Abnormal results of liver function studies: Secondary | ICD-10-CM

## 2017-12-03 DIAGNOSIS — E7800 Pure hypercholesterolemia, unspecified: Secondary | ICD-10-CM

## 2017-12-03 DIAGNOSIS — E119 Type 2 diabetes mellitus without complications: Secondary | ICD-10-CM

## 2017-12-03 DIAGNOSIS — R7989 Other specified abnormal findings of blood chemistry: Secondary | ICD-10-CM

## 2017-12-03 DIAGNOSIS — I10 Essential (primary) hypertension: Secondary | ICD-10-CM

## 2017-12-03 DIAGNOSIS — D518 Other vitamin B12 deficiency anemias: Secondary | ICD-10-CM

## 2017-12-03 NOTE — Telephone Encounter (Signed)
Copied from [place]. Topic: General - Other >> Dec 03, 2017 11:52 AM Synthia Innocent wrote: Reason for CRM: Patient scheduled to see Dr Nicki Reaper on 12/06/17 @2 :31, requesting to come in the AM for lab work before appt. Dr Marius Ditch, GI dr is requesting hemoglobin. Please advise

## 2017-12-06 ENCOUNTER — Ambulatory Visit (INDEPENDENT_AMBULATORY_CARE_PROVIDER_SITE_OTHER): Payer: PPO | Admitting: Internal Medicine

## 2017-12-06 ENCOUNTER — Other Ambulatory Visit: Payer: Self-pay | Admitting: Internal Medicine

## 2017-12-06 ENCOUNTER — Encounter: Payer: Self-pay | Admitting: Internal Medicine

## 2017-12-06 ENCOUNTER — Other Ambulatory Visit (INDEPENDENT_AMBULATORY_CARE_PROVIDER_SITE_OTHER): Payer: PPO

## 2017-12-06 VITALS — BP 114/70 | HR 87 | Temp 98.4°F | Resp 16 | Wt 176.0 lb

## 2017-12-06 DIAGNOSIS — E119 Type 2 diabetes mellitus without complications: Secondary | ICD-10-CM

## 2017-12-06 DIAGNOSIS — K648 Other hemorrhoids: Secondary | ICD-10-CM | POA: Diagnosis not present

## 2017-12-06 DIAGNOSIS — K219 Gastro-esophageal reflux disease without esophagitis: Secondary | ICD-10-CM | POA: Diagnosis not present

## 2017-12-06 DIAGNOSIS — R7989 Other specified abnormal findings of blood chemistry: Secondary | ICD-10-CM

## 2017-12-06 DIAGNOSIS — I10 Essential (primary) hypertension: Secondary | ICD-10-CM | POA: Diagnosis not present

## 2017-12-06 DIAGNOSIS — D518 Other vitamin B12 deficiency anemias: Secondary | ICD-10-CM | POA: Diagnosis not present

## 2017-12-06 DIAGNOSIS — K529 Noninfective gastroenteritis and colitis, unspecified: Secondary | ICD-10-CM | POA: Diagnosis not present

## 2017-12-06 DIAGNOSIS — Z1231 Encounter for screening mammogram for malignant neoplasm of breast: Secondary | ICD-10-CM

## 2017-12-06 DIAGNOSIS — F439 Reaction to severe stress, unspecified: Secondary | ICD-10-CM

## 2017-12-06 DIAGNOSIS — R945 Abnormal results of liver function studies: Secondary | ICD-10-CM

## 2017-12-06 DIAGNOSIS — L4050 Arthropathic psoriasis, unspecified: Secondary | ICD-10-CM

## 2017-12-06 DIAGNOSIS — R600 Localized edema: Secondary | ICD-10-CM | POA: Diagnosis not present

## 2017-12-06 DIAGNOSIS — E7800 Pure hypercholesterolemia, unspecified: Secondary | ICD-10-CM

## 2017-12-06 DIAGNOSIS — M19039 Primary osteoarthritis, unspecified wrist: Secondary | ICD-10-CM | POA: Diagnosis not present

## 2017-12-06 DIAGNOSIS — Z1239 Encounter for other screening for malignant neoplasm of breast: Secondary | ICD-10-CM

## 2017-12-06 LAB — BASIC METABOLIC PANEL
BUN: 14 mg/dL (ref 6–23)
CALCIUM: 8.4 mg/dL (ref 8.4–10.5)
CO2: 26 mEq/L (ref 19–32)
Chloride: 104 mEq/L (ref 96–112)
Creatinine, Ser: 0.97 mg/dL (ref 0.40–1.20)
GFR: 60.91 mL/min (ref >60.00)
Glucose, Bld: 198 mg/dL — ABNORMAL HIGH (ref 70–99)
Potassium: 2.9 mEq/L — ABNORMAL LOW (ref 3.5–5.1)
Sodium: 140 mEq/L (ref 135–145)

## 2017-12-06 LAB — LDL CHOLESTEROL, DIRECT: Direct LDL: 120 mg/dL

## 2017-12-06 LAB — LIPID PANEL
CHOLESTEROL: 233 mg/dL — AB (ref 0–200)
HDL: 60.1 mg/dL (ref >39.00)
NONHDL: 173.22
TRIGLYCERIDES: 327 mg/dL — AB (ref 0.0–149.0)
Total CHOL/HDL Ratio: 4
VLDL: 65.4 mg/dL — ABNORMAL HIGH (ref 0.0–40.0)

## 2017-12-06 LAB — CBC WITH DIFFERENTIAL/PLATELET
Basophils Absolute: 0.1 10*3/uL (ref 0.0–0.1)
Basophils Relative: 1.2 % (ref 0.0–3.0)
EOS PCT: 2.6 % (ref 0.0–5.0)
Eosinophils Absolute: 0.2 10*3/uL (ref 0.0–0.7)
HEMATOCRIT: 40.2 % (ref 36.0–46.0)
Hemoglobin: 13.2 g/dL (ref 12.0–15.0)
LYMPHS ABS: 2.4 10*3/uL (ref 0.7–4.0)
LYMPHS PCT: 35.4 % (ref 12.0–46.0)
MCHC: 32.9 g/dL (ref 30.0–36.0)
MCV: 92.2 fl (ref 78.0–100.0)
MONOS PCT: 6.2 % (ref 3.0–12.0)
Monocytes Absolute: 0.4 10*3/uL (ref 0.1–1.0)
NEUTROS ABS: 3.7 10*3/uL (ref 1.4–7.7)
NEUTROS PCT: 54.6 % (ref 43.0–77.0)
PLATELETS: 245 10*3/uL (ref 150.0–400.0)
RBC: 4.36 Mil/uL (ref 3.87–5.11)
RDW: 13.4 % (ref 11.5–15.5)
WBC: 6.9 10*3/uL (ref 4.0–10.5)

## 2017-12-06 LAB — HEPATIC FUNCTION PANEL
ALK PHOS: 65 U/L (ref 39–117)
ALT: 22 U/L (ref 0–35)
AST: 25 U/L (ref 0–37)
Albumin: 3.7 g/dL (ref 3.5–5.2)
BILIRUBIN DIRECT: 0.1 mg/dL (ref 0.0–0.3)
BILIRUBIN TOTAL: 0.4 mg/dL (ref 0.2–1.2)
Total Protein: 6.1 g/dL (ref 6.0–8.3)

## 2017-12-06 LAB — HM DIABETES FOOT EXAM

## 2017-12-06 LAB — FERRITIN: Ferritin: 31.8 ng/mL (ref 10.0–291.0)

## 2017-12-06 LAB — HEMOGLOBIN A1C: Hgb A1c MFr Bld: 8.3 % — ABNORMAL HIGH (ref 4.6–6.5)

## 2017-12-06 NOTE — Telephone Encounter (Signed)
Order placed for labs.

## 2017-12-06 NOTE — Telephone Encounter (Signed)
See last unrouted msg 

## 2017-12-06 NOTE — Telephone Encounter (Signed)
Pt came in for labs with no orders. Pt was drawn for basic labs. Please place future orders. Thank you

## 2017-12-06 NOTE — Telephone Encounter (Signed)
Spoke with patient. She stated she had not had anything to eat or drink today and would like to come in before her appt to have labs done. Please place orders and add hgb as well. Thanks.

## 2017-12-06 NOTE — Telephone Encounter (Signed)
OK for patient to come in for labs

## 2017-12-06 NOTE — Progress Notes (Signed)
Patient ID: Ashley Gross, female   DOB: 02-16-1951, 67 y.o.   MRN: [id number]   Subjective:    Patient ID: Ashley Gross, female    DOB: 1951/06/14, 67 y.o.   MRN: [id number]  HPI  Patient here for a scheduled follow up.  Is being evaluated by GI for chronic diarrhea and rectal bleeding.  Seeing Dr Marius Ditch.  W/up in progress.  S/p ligation of hemorrhoids.  Was told did not have enzyme deficiency.  Planning for EGD/colonoscopy.  Started on rifaximin.  Off iron.  Still with persistent loose stool.  Plans to f/u with GI regarding the persistent symptoms.  She is eating.  Trying to watch her sugar.  No chest pain.  Breathing stable.  No acid reflux reported.  Increased stress with her husband's medical issues.  Overall she feels she is handling things relatively well.  Does not feel needs any further intervention.     Past Medical History:  Diagnosis Date  . Anemia, unspecified   . Colitis   . Diabetes mellitus ([place])   . Esophagitis   . H/O hiatal hernia   . Hx of arteriovenous malformation (AVM)   . Hypertension   . IBS (irritable bowel syndrome)   . Nephrolithiasis   . Pernicious anemia   . Psoriatic arthritis ([facility])    s/p penicillamine, plaquenil, MTX, sulfasalazine.  s/p Embrel, Humira,.  Iritis.    . Pure hypercholesterolemia    Past Surgical History:  Procedure Laterality Date  . ABDOMINAL HYSTERECTOMY  1981   ovaries left in place  . ANKLE SURGERY     right  . BUNIONECTOMY    . CHOLECYSTECTOMY  1989  . HAND SURGERY     right  . HEEL SPUR SURGERY    . NOSE SURGERY     x2  . UMBILICAL HERNIA REPAIR     Family History  Problem Relation Age of Onset  . Diabetes Other   . Hypertension Other   . Breast cancer Neg Hx   . Colon cancer Neg Hx    Social History   Socioeconomic History  . Marital status: Married    Spouse name: None  . Number of children: 2  . Years of education: None  . Highest education level: None  Social Needs  . Financial resource strain: None   . Food insecurity - worry: None  . Food insecurity - inability: None  . Transportation needs - medical: None  . Transportation needs - non-medical: None  Occupational History  . None  Tobacco Use  . Smoking status: Never Smoker  . Smokeless tobacco: Never Used  Substance and Sexual Activity  . Alcohol use: No    Alcohol/week: 0.0 oz  . Drug use: No  . Sexual activity: Not Currently  Other Topics Concern  . None  Social History Narrative   She is married and has two children    Outpatient Encounter Medications as of 12/06/2017  Medication Sig  . albuterol (ACCUNEB) 0.63 MG/3ML nebulizer solution Take 3 mLs (0.63 mg total) by nebulization every 6 (six) hours as needed for wheezing.  Marland Kitchen amLODipine (NORVASC) 5 MG tablet take 1 tablet by mouth twice a day  . Calcium Carbonate-Vit D-Min (CALCIUM 600+D PLUS MINERALS) 600-400 MG-UNIT TABS Take by mouth.  . Cholecalciferol (VITAMIN D-3) 1000 UNITS CAPS Take 1 capsule by mouth daily.  . citalopram (CELEXA) 10 MG tablet TK 1 T PO ONCE D  . dicyclomine (BENTYL) 20 MG tablet Take 1 tablet (20 mg  total) by mouth every 6 (six) hours.  . diphenhydrAMINE (BENADRYL) 25 mg capsule Take by mouth.  . DOCOSAHEXAENOIC ACID PO Take by mouth.  . Fluticasone-Salmeterol (ADVAIR DISKUS) 250-50 MCG/DOSE AEPB Inhale into the lungs.  Marland Kitchen glipiZIDE (GLUCOTROL XL) 5 MG 24 hr tablet Take 1 tablet (5 mg total) by mouth daily.  Marland Kitchen ipratropium (ATROVENT HFA) 17 MCG/ACT inhaler Inhale 2 puffs into the lungs every 6 (six) hours.  Marland Kitchen lisinopril (PRINIVIL,ZESTRIL) 40 MG tablet TAKE 1 TABLET BY MOUTH ONCE DAILY  . loperamide (IMODIUM) 2 MG capsule Take by mouth.  Marland Kitchen omeprazole (PRILOSEC) 20 MG capsule take 1 capsule by mouth once daily  . ONE TOUCH ULTRA TEST test strip PATIENT NEEDS NEW METER STRIPS AND LANCETS FOR ONE TOUCH. HER INSURANCE NO LONGER COVERS ACCUCHEK.  Marland Kitchen PROCTOZONE-HC 2.5 % rectal cream   . rifaximin (XIFAXAN) 550 MG TABS tablet Take 1 tablet (550 mg total) by  mouth 3 (three) times daily for 14 days.  Marland Kitchen triamterene-hydrochlorothiazide (MAXZIDE-25) 37.5-25 MG tablet Take 0.5 tablets by mouth daily.  . [DISCONTINUED] cyclobenzaprine (FLEXERIL) 5 MG tablet Take 1 tablet (5 mg total) by mouth at bedtime as needed for muscle spasms.  . [DISCONTINUED] pioglitazone (ACTOS) 15 MG tablet take 1 tablet by mouth once daily  . [DISCONTINUED] rosuvastatin (CRESTOR) 5 MG tablet TAKE 1 TABLET(5 MG) BY MOUTH DAILY  . [DISCONTINUED] tiZANidine (ZANAFLEX) 2 MG tablet Take 1 tablet (2 mg total) by mouth at bedtime as needed for muscle spasms.   No facility-administered encounter medications on file as of 12/06/2017.     Review of Systems  Constitutional: Negative for appetite change and unexpected weight change.  HENT: Negative for congestion and sinus pressure.   Respiratory: Negative for cough, chest tightness and shortness of breath.   Cardiovascular: Negative for chest pain, palpitations and leg swelling.  Gastrointestinal: Negative for nausea and vomiting.       Persistent loose stool as outlined.  Persistent rectal bleeding.  Some abdominal discomfort.   Genitourinary: Negative for difficulty urinating and dysuria.  Musculoskeletal: Negative for myalgias.       S/p injection of wrist - ortho.   Skin: Negative for color change and rash.  Neurological: Negative for dizziness, light-headedness and headaches.  Psychiatric/Behavioral: Negative for agitation and dysphoric mood.       Objective:     Blood pressure rechecked by me:  122/76  Physical Exam  Constitutional: She appears well-developed and well-nourished. No distress.  HENT:  Nose: Nose normal.  Mouth/Throat: Oropharynx is clear and moist.  Neck: Neck supple. No thyromegaly present.  Cardiovascular: Normal rate and regular rhythm.  Pulmonary/Chest: Breath sounds normal. No respiratory distress. She has no wheezes.  Abdominal: Soft. Bowel sounds are normal.  No increased tenderness to palpation.    Musculoskeletal: She exhibits no edema or tenderness.  Feet:  No lesions.  Intact to light touch and pin prick.   Lymphadenopathy:    She has no cervical adenopathy.  Skin: No rash noted. No erythema.  Psychiatric: She has a normal mood and affect. Her behavior is normal.    BP 114/70 (BP Location: Left Arm, Patient Position: Sitting, Cuff Size: Normal)   Pulse 87   Temp 98.4 F (36.9 C) (Oral)   Resp 16   Wt 176 lb (79.8 kg)   SpO2 96%   BMI 30.21 kg/m  Wt Readings from Last 3 Encounters:  12/06/17 176 lb (79.8 kg)  11/19/17 177 lb 9.6 oz (80.6 kg)  10/04/17 177  lb (80.3 kg)     Lab Results  Component Value Date   WBC 6.9 12/06/2017   HGB 13.2 12/06/2017   HCT 40.2 12/06/2017   PLT 245.0 12/06/2017   GLUCOSE 198 (H) 12/06/2017   CHOL 233 (H) 12/06/2017   TRIG 327.0 (H) 12/06/2017   HDL 60.10 12/06/2017   LDLDIRECT 120.0 12/06/2017   LDLCALC 124 (H) 07/08/2017   ALT 22 12/06/2017   AST 25 12/06/2017   NA 140 12/06/2017   K 2.9 (L) 12/06/2017   CL 104 12/06/2017   CREATININE 0.97 12/06/2017   BUN 14 12/06/2017   CO2 26 12/06/2017   TSH 2.68 07/08/2017   HGBA1C 8.3 (H) 12/06/2017   MICROALBUR 3.5 (H) 03/16/2017    Dg Bone Density  Result Date: 01/11/2017 EXAM: DUAL X-RAY ABSORPTIOMETRY (DXA) FOR BONE MINERAL DENSITY IMPRESSION: Dear Dr. Nicki Reaper, Your patient Hostler completed a BMD test on 01/11/2017 using the [place] (analysis version: 14.10) manufactured by [employer]. The following summarizes the results of our evaluation. PATIENT BIOGRAPHICAL: Name: Ashley Gross, Ashley Gross Patient ID: [id number] Birth Date: Oct 24, 1951 Height: 64.0 in. Gender: Female Exam Date: 01/11/2017 Weight: 171.0 lbs. Indications: asthma, Caucasian, Diabetic, History of Fracture (Adult), Hysterectomy, Postmenopausal Fractures: Right foot Treatments: actos, Albuterol, glipizide, Metformin, omeprazole, ventolin HFA, Vitamin D ASSESSMENT: The BMD measured at Femur Neck Left is 0.697 g/cm2  with a T-score of -2.5. This patient is considered osteoporotic according to [facility]) criteria. Site Region Measured Measured WHO Young Adult BMD Date       Age      Classification T-score AP Spine L1-L4 01/11/2017 65.9 Osteopenia -1.8 0.976 g/cm2 DualFemur Neck Left 01/11/2017 65.9 Osteoporosis -2.5 0.697 g/cm2 World Health Organization [facility]) criteria for post-menopausal, Caucasian Women: Normal:       T-score at or above -1 SD Osteopenia:   T-score between -1 and -2.5 SD Osteoporosis: T-score at or below -2.5 SD RECOMMENDATIONS: [facility] recommends that FDA-approved medical therapies be considered in postmenopausal women and men age 56 or older with a: 1. Hip or vertebral (clinical or morphometric) fracture. 2. T-score of < -2.5 at the spine or hip. 3. Ten-year fracture probability by FRAX of 3% or greater for hip fracture or 20% or greater for major osteoporotic fracture. All treatment decisions require clinical judgment and consideration of individual patient factors, including patient preferences, co-morbidities, previous drug use, risk factors not captured in the FRAX model (e.g. falls, vitamin D deficiency, increased bone turnover, interval significant decline in bone density) and possible under - or over-estimation of fracture risk by FRAX. All patients should ensure an adequate intake of dietary calcium (1200 mg/d) and vitamin D (800 IU daily) unless contraindicated. FOLLOW-UP: People with diagnosed cases of osteoporosis or at high risk for fracture should have regular bone mineral density tests. For patients eligible for Medicare, routine testing is allowed once every 2 years. The testing frequency can be increased to one year for patients who have rapidly progressing disease, those who are receiving or discontinuing medical therapy to restore bone mass, or have additional risk factors. I have reviewed this report, and agree with the above findings. [facility]  Radiology Electronically Signed   By: Claudie Revering M.D.   On: 01/11/2017 09:45   Mm Digital Screening Bilateral  Result Date: 01/11/2017 CLINICAL DATA:  Screening. EXAM: DIGITAL SCREENING BILATERAL MAMMOGRAM WITH CAD COMPARISON:  Previous exam(s). ACR Breast Density Category b: There are scattered areas of fibroglandular density. FINDINGS: There are  no findings suspicious for malignancy. Images were processed with CAD. IMPRESSION: No mammographic evidence of malignancy. A result letter of this screening mammogram will be mailed directly to the patient. RECOMMENDATION: Screening mammogram in one year. (Code:SM-B-01Y) BI-RADS CATEGORY  1: Negative. Electronically Signed   By: Lajean Manes M.D.   On: 01/11/2017 12:14       Assessment & Plan:   Problem List Items Addressed This Visit    Abnormal liver function test    Recent liver panel wnl.       Anemia    hgb 12/06/17 - wnl.       Bilateral lower extremity edema    Improved.        Bleeding internal hemorrhoids    S/p recent ligation.        Chronic diarrhea    Undergoing GI w/up as outlined.  planning for EGD and colonoscopy.        Diabetes mellitus ([place])    Low carb diet and exercise.  Off metformin.  Follow met b and a1c. Discussed the need for eye exam.        GERD (gastroesophageal reflux disease)    Upper symptoms controlled.        Hypercholesterolemia    On crestor.  Low cholesterol diet and exercise.  Follow.       Hypertension    Blood pressure under good control.  Continue same medication regimen.  Follow pressures.  Follow metabolic panel.        Osteoarthritis of wrist    Saw ortho.  S/p injection.       Psoriatic arthritis ([place])    Followed by Dr Jefm Bryant.       Stress    Overall she feels she is handling things relatively well.  Follow.  Does not feel needs any further intervention.         Other Visit Diagnoses    Breast cancer screening    -  Primary   Relevant Orders   MM DIGITAL  SCREENING BILATERAL       Einar Pheasant, MD

## 2017-12-06 NOTE — Telephone Encounter (Signed)
She is actually due for fasting labs also.  Is she wanting to just get the hgb checked and then come back in for fasting labs?  I do not mind her coming in early for lab appt, but just wanted to make sure she was aware would need fasting labs.  Actually this original message was from three days ago. She may have wanted to come in fasting.  Just need to clarify.

## 2017-12-07 ENCOUNTER — Other Ambulatory Visit: Payer: Self-pay | Admitting: Internal Medicine

## 2017-12-07 ENCOUNTER — Telehealth: Payer: Self-pay | Admitting: Internal Medicine

## 2017-12-07 MED ORDER — POTASSIUM CHLORIDE ER 10 MEQ PO TBCR: EXTENDED_RELEASE_TABLET | ORAL | 0 refills | Status: DC

## 2017-12-07 MED ORDER — ROSUVASTATIN CALCIUM 10 MG PO TABS: 10.0000 mg | ORAL_TABLET | Freq: Every day | ORAL | 3 refills | Status: DC

## 2017-12-07 NOTE — Progress Notes (Signed)
rx sent in for kcl 56meq bid x  3 days and then q day and crestor 10mg .

## 2017-12-07 NOTE — Telephone Encounter (Signed)
FYI

## 2017-12-07 NOTE — Telephone Encounter (Signed)
Copied from [place] [id number]. Topic: Quick Communication - See Telephone Encounter >> Dec 07, 2017  9:45 AM Bea Graff, NT wrote: CRM for notification. See Telephone encounter for: Pt calling and states she would not like a referral to a endocrinologist at this time. She would like to try to get her sugars under control on her own at this time.   12/07/17.

## 2017-12-08 ENCOUNTER — Other Ambulatory Visit: Payer: Self-pay

## 2017-12-08 DIAGNOSIS — Z860100 Personal history of colon polyps, unspecified: Secondary | ICD-10-CM

## 2017-12-08 DIAGNOSIS — K529 Noninfective gastroenteritis and colitis, unspecified: Secondary | ICD-10-CM

## 2017-12-09 ENCOUNTER — Encounter: Payer: Self-pay | Admitting: Internal Medicine

## 2017-12-09 NOTE — Assessment & Plan Note (Signed)
Improved

## 2017-12-09 NOTE — Assessment & Plan Note (Signed)
Overall she feels she is handling things relatively well.  Follow.  Does not feel needs any further intervention.

## 2017-12-09 NOTE — Assessment & Plan Note (Signed)
Upper symptoms controlled.  

## 2017-12-09 NOTE — Assessment & Plan Note (Signed)
S/p recent ligation.

## 2017-12-09 NOTE — Assessment & Plan Note (Signed)
Undergoing GI w/up as outlined.  planning for EGD and colonoscopy.

## 2017-12-09 NOTE — Assessment & Plan Note (Signed)
hgb 12/06/17 - wnl.

## 2017-12-09 NOTE — Assessment & Plan Note (Signed)
On crestor.  Low cholesterol diet and exercise.  Follow.

## 2017-12-09 NOTE — Assessment & Plan Note (Signed)
Low carb diet and exercise.  Off metformin.  Follow met b and a1c. Discussed the need for eye exam.

## 2017-12-09 NOTE — Assessment & Plan Note (Signed)
Followed by Dr Kernodle.   

## 2017-12-09 NOTE — Assessment & Plan Note (Signed)
Blood pressure under good control.  Continue same medication regimen.  Follow pressures.  Follow metabolic panel.   

## 2017-12-09 NOTE — Assessment & Plan Note (Signed)
Recent liver panel wnl.  

## 2017-12-09 NOTE — Assessment & Plan Note (Signed)
Saw ortho.  S/p injection.  

## 2017-12-10 ENCOUNTER — Other Ambulatory Visit: Payer: Self-pay | Admitting: Internal Medicine

## 2017-12-13 ENCOUNTER — Telehealth: Payer: Self-pay | Admitting: Radiology

## 2017-12-13 ENCOUNTER — Other Ambulatory Visit: Payer: Self-pay | Admitting: Internal Medicine

## 2017-12-13 ENCOUNTER — Ambulatory Visit: Payer: PPO | Admitting: Gastroenterology

## 2017-12-13 DIAGNOSIS — E876 Hypokalemia: Secondary | ICD-10-CM

## 2017-12-13 NOTE — Progress Notes (Signed)
Order placed for f/u potassium.  

## 2017-12-13 NOTE — Telephone Encounter (Signed)
Pt coming in for labs tomorrow, please place future orders. Thank you.  

## 2017-12-13 NOTE — Telephone Encounter (Signed)
Order placed for f/u potassium.  

## 2017-12-14 ENCOUNTER — Other Ambulatory Visit: Payer: Self-pay

## 2017-12-14 ENCOUNTER — Other Ambulatory Visit (INDEPENDENT_AMBULATORY_CARE_PROVIDER_SITE_OTHER): Payer: PPO

## 2017-12-14 DIAGNOSIS — E876 Hypokalemia: Secondary | ICD-10-CM

## 2017-12-14 LAB — POTASSIUM: POTASSIUM: 3.1 meq/L — AB (ref 3.5–5.1)

## 2017-12-15 ENCOUNTER — Other Ambulatory Visit: Payer: Self-pay | Admitting: Internal Medicine

## 2017-12-15 DIAGNOSIS — E876 Hypokalemia: Secondary | ICD-10-CM

## 2017-12-15 DIAGNOSIS — K529 Noninfective gastroenteritis and colitis, unspecified: Secondary | ICD-10-CM

## 2017-12-15 NOTE — Progress Notes (Signed)
Order placed for f/u met b.  

## 2017-12-24 ENCOUNTER — Encounter: Payer: Self-pay | Admitting: Student

## 2017-12-27 ENCOUNTER — Ambulatory Visit: Payer: PPO | Admitting: Anesthesiology

## 2017-12-27 ENCOUNTER — Ambulatory Visit
Admission: RE | Admit: 2017-12-27 | Discharge: 2017-12-27 | Disposition: A | Discharge status: Home / Self Care | Payer: PPO | Source: Ambulatory Visit | Attending: Gastroenterology | Admitting: Gastroenterology

## 2017-12-27 ENCOUNTER — Other Ambulatory Visit: Payer: Self-pay | Admitting: Internal Medicine

## 2017-12-27 ENCOUNTER — Encounter
Admission: RE | Disposition: A | Discharge status: Home / Self Care | Payer: Self-pay | Source: Ambulatory Visit | Attending: Gastroenterology

## 2017-12-27 ENCOUNTER — Encounter: Payer: Self-pay | Admitting: *Deleted

## 2017-12-27 ENCOUNTER — Telehealth: Payer: Self-pay

## 2017-12-27 DIAGNOSIS — Z79899 Other long term (current) drug therapy: Secondary | ICD-10-CM | POA: Insufficient documentation

## 2017-12-27 DIAGNOSIS — K921 Melena: Secondary | ICD-10-CM | POA: Insufficient documentation

## 2017-12-27 DIAGNOSIS — K2960 Other gastritis without bleeding: Secondary | ICD-10-CM | POA: Diagnosis not present

## 2017-12-27 DIAGNOSIS — Z7984 Long term (current) use of oral hypoglycemic drugs: Secondary | ICD-10-CM | POA: Diagnosis not present

## 2017-12-27 DIAGNOSIS — Z7951 Long term (current) use of inhaled steroids: Secondary | ICD-10-CM | POA: Diagnosis not present

## 2017-12-27 DIAGNOSIS — J45909 Unspecified asthma, uncomplicated: Secondary | ICD-10-CM | POA: Insufficient documentation

## 2017-12-27 DIAGNOSIS — L4050 Arthropathic psoriasis, unspecified: Secondary | ICD-10-CM | POA: Insufficient documentation

## 2017-12-27 DIAGNOSIS — R197 Diarrhea, unspecified: Secondary | ICD-10-CM | POA: Diagnosis not present

## 2017-12-27 DIAGNOSIS — D125 Benign neoplasm of sigmoid colon: Secondary | ICD-10-CM | POA: Diagnosis not present

## 2017-12-27 DIAGNOSIS — K219 Gastro-esophageal reflux disease without esophagitis: Secondary | ICD-10-CM | POA: Insufficient documentation

## 2017-12-27 DIAGNOSIS — I10 Essential (primary) hypertension: Secondary | ICD-10-CM | POA: Diagnosis not present

## 2017-12-27 DIAGNOSIS — K2950 Unspecified chronic gastritis without bleeding: Secondary | ICD-10-CM | POA: Insufficient documentation

## 2017-12-27 DIAGNOSIS — D123 Benign neoplasm of transverse colon: Secondary | ICD-10-CM

## 2017-12-27 DIAGNOSIS — Z86010 Personal history of colonic polyps: Secondary | ICD-10-CM | POA: Insufficient documentation

## 2017-12-27 DIAGNOSIS — K580 Irritable bowel syndrome with diarrhea: Secondary | ICD-10-CM | POA: Insufficient documentation

## 2017-12-27 DIAGNOSIS — E119 Type 2 diabetes mellitus without complications: Secondary | ICD-10-CM | POA: Insufficient documentation

## 2017-12-27 DIAGNOSIS — K635 Polyp of colon: Secondary | ICD-10-CM | POA: Insufficient documentation

## 2017-12-27 DIAGNOSIS — K3189 Other diseases of stomach and duodenum: Secondary | ICD-10-CM | POA: Diagnosis not present

## 2017-12-27 DIAGNOSIS — E7800 Pure hypercholesterolemia, unspecified: Secondary | ICD-10-CM | POA: Insufficient documentation

## 2017-12-27 DIAGNOSIS — K529 Noninfective gastroenteritis and colitis, unspecified: Secondary | ICD-10-CM | POA: Diagnosis not present

## 2017-12-27 DIAGNOSIS — D649 Anemia, unspecified: Secondary | ICD-10-CM | POA: Diagnosis not present

## 2017-12-27 DIAGNOSIS — K5790 Diverticulosis of intestine, part unspecified, without perforation or abscess without bleeding: Secondary | ICD-10-CM | POA: Diagnosis not present

## 2017-12-27 DIAGNOSIS — K51418 Inflammatory polyps of colon with other complication: Secondary | ICD-10-CM | POA: Diagnosis not present

## 2017-12-27 DIAGNOSIS — D122 Benign neoplasm of ascending colon: Secondary | ICD-10-CM | POA: Diagnosis not present

## 2017-12-27 DIAGNOSIS — L539 Erythematous condition, unspecified: Secondary | ICD-10-CM | POA: Diagnosis not present

## 2017-12-27 HISTORY — PX: ESOPHAGOGASTRODUODENOSCOPY (EGD) WITH PROPOFOL: SHX5813

## 2017-12-27 HISTORY — PX: COLONOSCOPY WITH PROPOFOL: SHX5780

## 2017-12-27 HISTORY — DX: Unspecified asthma, uncomplicated: J45.909

## 2017-12-27 LAB — GLUCOSE, CAPILLARY: Glucose-Capillary: 134 mg/dL — ABNORMAL HIGH (ref 65–99)

## 2017-12-27 SURGERY — COLONOSCOPY WITH PROPOFOL
Anesthesia: General

## 2017-12-27 MED ORDER — PROPOFOL 500 MG/50ML IV EMUL
INTRAVENOUS | Status: DC | PRN
  Administered 2017-12-27: 75 ug/kg/min via INTRAVENOUS

## 2017-12-27 MED ORDER — LIDOCAINE HCL (PF) 1 % IJ SOLN
INTRAMUSCULAR | Status: AC
  Administered 2017-12-27: 0.3 mL via INTRADERMAL
  Filled 2017-12-27: qty 2

## 2017-12-27 MED ORDER — PROPOFOL 500 MG/50ML IV EMUL
INTRAVENOUS | Status: AC
  Filled 2017-12-27: qty 50

## 2017-12-27 MED ORDER — IPRATROPIUM-ALBUTEROL 0.5-2.5 (3) MG/3ML IN SOLN
3.0000 mL | Freq: Once | RESPIRATORY_TRACT | Status: AC
  Administered 2017-12-27: 3 mL via RESPIRATORY_TRACT

## 2017-12-27 MED ORDER — IPRATROPIUM-ALBUTEROL 0.5-2.5 (3) MG/3ML IN SOLN
RESPIRATORY_TRACT | Status: AC
  Administered 2017-12-27: 3 mL via RESPIRATORY_TRACT
  Filled 2017-12-27: qty 3

## 2017-12-27 MED ORDER — LIDOCAINE HCL (PF) 1 % IJ SOLN
2.0000 mL | Freq: Once | INTRAMUSCULAR | Status: AC
  Administered 2017-12-27: 0.3 mL via INTRADERMAL

## 2017-12-27 MED ORDER — SODIUM CHLORIDE 0.9 % IV SOLN
INTRAVENOUS | Status: DC
  Administered 2017-12-27: 1000 mL via INTRAVENOUS
  Administered 2017-12-27: 08:00:00 via INTRAVENOUS

## 2017-12-27 MED ORDER — PHENYLEPHRINE HCL 10 MG/ML IJ SOLN
INTRAMUSCULAR | Status: DC | PRN
  Administered 2017-12-27 (×3): 100 ug via INTRAVENOUS

## 2017-12-27 MED ORDER — PROPOFOL 10 MG/ML IV BOLUS
INTRAVENOUS | Status: DC | PRN
  Administered 2017-12-27: 20 mg via INTRAVENOUS
  Administered 2017-12-27: 30 mg via INTRAVENOUS
  Administered 2017-12-27: 20 mg via INTRAVENOUS

## 2017-12-27 NOTE — H&P (Signed)
Ashley Darby, MD [address]  [place]  [place], [place] [address]  Main: [phone]  Fax: [phone] Pager: [phone]  Primary Care Physician:  Ashley Pheasant, MD Primary Gastroenterologist:  Dr. Cephas Gross  Pre-Procedure History & Physical: HPI:  Ashley Gross is a 67 y.o. female is here for an endoscopy and colonoscopy.   Past Medical History:  Diagnosis Date  . Anemia, unspecified   . Asthma   . Colitis   . Diabetes mellitus ([place])   . Esophagitis   . H/O hiatal hernia   . Hx of arteriovenous malformation (AVM)   . Hypertension   . IBS (irritable bowel syndrome)   . Nephrolithiasis   . Pernicious anemia   . Psoriatic arthritis ([facility])    s/p penicillamine, plaquenil, MTX, sulfasalazine.  s/p Embrel, Humira,.  Iritis.    . Pure hypercholesterolemia     Past Surgical History:  Procedure Laterality Date  . ABDOMINAL HYSTERECTOMY  1981   ovaries left in place  . ANKLE SURGERY     right  . BUNIONECTOMY    . CHOLECYSTECTOMY  1989  . HAND SURGERY     right  . HEEL SPUR SURGERY    . NOSE SURGERY     x2  . UMBILICAL HERNIA REPAIR      Prior to Admission medications   Medication Sig Start Date End Date Taking? Authorizing Provider  albuterol (ACCUNEB) 0.63 MG/3ML nebulizer solution Take 3 mLs (0.63 mg total) by nebulization every 6 (six) hours as needed for wheezing. 09/22/16   Ashley Pheasant, MD  amLODipine (NORVASC) 5 MG tablet take 1 tablet by mouth twice a day 12/14/16   Ashley Pheasant, MD  Calcium Carbonate-Vit D-Min (CALCIUM 600+D PLUS MINERALS) 600-400 MG-UNIT TABS Take by mouth.    [provider]  Cholecalciferol (VITAMIN D-3) 1000 UNITS CAPS Take 1 capsule by mouth daily.    [provider]  citalopram (CELEXA) 10 MG tablet TAKE 1 TABLET BY MOUTH ONCE DAILY 12/10/17   Ashley Pheasant, MD  dicyclomine (BENTYL) 20 MG tablet Take 1 tablet (20 mg total) by mouth every 6 (six) hours. 08/10/17 09/09/17  Ashley Landsman, MD    diphenhydrAMINE (BENADRYL) 25 mg capsule Take by mouth.    [provider]  DOCOSAHEXAENOIC ACID PO Take by mouth.    [provider]  Fluticasone-Salmeterol (ADVAIR DISKUS) 250-50 MCG/DOSE AEPB Inhale into the lungs.    [provider]  glipiZIDE (GLUCOTROL XL) 5 MG 24 hr tablet TAKE 1 TABLET BY MOUTH EVERY DAY 12/13/17   Ashley Pheasant, MD  ipratropium (ATROVENT HFA) 17 MCG/ACT inhaler Inhale 2 puffs into the lungs every 6 (six) hours.    [provider]  lisinopril (PRINIVIL,ZESTRIL) 40 MG tablet TAKE 1 TABLET BY MOUTH ONCE DAILY 10/13/17   Ashley Pheasant, MD  loperamide (IMODIUM) 2 MG capsule Take by mouth.    [provider]  metFORMIN (GLUCOPHAGE) 1000 MG tablet TAKE 1 TABLET BY MOUTH TWICE A DAY WITH MEALS 12/10/17   Ashley Pheasant, MD  omeprazole (PRILOSEC) 20 MG capsule TAKE 1 CAPSULE BY MOUTH ONCE DAILY 12/10/17   Ashley Pheasant, MD  ONE TOUCH ULTRA TEST test strip PATIENT NEEDS NEW METER STRIPS AND LANCETS FOR ONE TOUCH. HER INSURANCE NO LONGER COVERS ACCUCHEK. 07/31/15   Ashley Pheasant, MD  pioglitazone (ACTOS) 15 MG tablet TAKE 1 TABLET BY MOUTH ONCE DAILY 12/08/17   Ashley Pheasant, MD  potassium chloride (K-DUR) 10 MEQ tablet Take 1 tablet bid  x 3 days and then q day. 12/07/17   Ashley Pheasant, MD  PROCTOZONE-HC 2.5 % rectal cream  09/21/17   [provider]  rosuvastatin (CRESTOR) 10 MG tablet Take 1 tablet (10 mg total) by mouth daily. 12/07/17   Ashley Pheasant, MD  triamterene-hydrochlorothiazide (MAXZIDE-25) 37.5-25 MG tablet Take 0.5 tablets by mouth daily. 04/16/16   Ashley Pheasant, MD    Allergies as of 12/08/2017 - Review Complete 12/06/2017  Allergen Reaction Noted  . Aspirin Other (See Comments) 10/06/2012  . Beta adrenergic blockers  10/06/2012  . Cobalamine combinations Other (See Comments) 11/18/2015  . Daypro [oxaprozin]  10/06/2012  . Enalapril maleate Other (See Comments) 11/18/2015  . Erythromycin Other (See  Comments) 10/06/2012  . Indocin [indomethacin]  10/06/2012  . Mtx support [cobalamine combinations] Other (See Comments) 10/06/2012  . Vasotec [enalapril] Cough 10/06/2012  . Verapamil Other (See Comments) 10/06/2012  . Voltaren [diclofenac sodium]  10/06/2012    Family History  Problem Relation Age of Onset  . Diabetes Other   . Hypertension Other   . Breast cancer Neg Hx   . Colon cancer Neg Hx     Social History   Socioeconomic History  . Marital status: Married    Spouse name: Not on file  . Number of children: 2  . Years of education: Not on file  . Highest education level: Not on file  Social Needs  . Financial resource strain: Not on file  . Food insecurity - worry: Not on file  . Food insecurity - inability: Not on file  . Transportation needs - medical: Not on file  . Transportation needs - non-medical: Not on file  Occupational History  . Not on file  Tobacco Use  . Smoking status: Never Smoker  . Smokeless tobacco: Never Used  Substance and Sexual Activity  . Alcohol use: No    Alcohol/week: 0.0 oz  . Drug use: No  . Sexual activity: Not Currently  Other Topics Concern  . Not on file  Social History Narrative   She is married and has two children    Review of Systems: See HPI, otherwise negative ROS  Physical Exam: BP 115/65   Pulse 76   Temp (!) 97 F (36.1 C) (Tympanic)   Resp 17   Ht 5\' 4"  (1.626 m)   Wt 176 lb (79.8 kg)   SpO2 97%   BMI 30.21 kg/m  General:   Alert,  pleasant and cooperative in NAD Head:  Normocephalic and atraumatic. Neck:  Supple; no masses or thyromegaly. Lungs:  Clear throughout to auscultation.    Heart:  Regular rate and rhythm. Abdomen:  Soft, nontender and nondistended. Normal bowel sounds, without guarding, and without rebound.   Neurologic:  Alert and  oriented x4;  grossly normal neurologically.  Impression/Plan: Ashley Gross is here for an endoscopy and colonoscopy to be performed for chronic  diarrhea, and h/o adenoma  Risks, benefits, limitations, and alternatives regarding  endoscopy and colonoscopy have been reviewed with the patient.  Questions have been answered.  All parties agreeable.   Ashley Sear, MD  12/27/2017, 8:04 AM

## 2017-12-27 NOTE — Anesthesia Postprocedure Evaluation (Signed)
Anesthesia Post Note  Patient: Ashley Gross  Procedure(s) Performed: COLONOSCOPY WITH PROPOFOL (N/A ) ESOPHAGOGASTRODUODENOSCOPY (EGD) WITH PROPOFOL (N/A )  Patient location during evaluation: Endoscopy Anesthesia Type: General Level of consciousness: awake and alert Pain management: pain level controlled Vital Signs Assessment: post-procedure vital signs reviewed and stable Respiratory status: spontaneous breathing, nonlabored ventilation, respiratory function stable and patient connected to nasal cannula oxygen Cardiovascular status: blood pressure returned to baseline and stable Postop Assessment: no apparent nausea or vomiting Anesthetic complications: no     Last Vitals:  Vitals:   12/27/17 0940 12/27/17 0950  BP: (!) 110/44 90/75  Pulse: 80 74  Resp: 17 11  Temp:    SpO2: 97% 100%    Last Pain:  Vitals:   12/27/17 0930  TempSrc: Tympanic                 Precious Haws Alishah Schulte

## 2017-12-27 NOTE — Op Note (Signed)
[facility] Gastroenterology Patient Name: Ashley Gross Procedure Date: 12/27/2017 8:12 AM MRN: [id number] Account #: [id number] Date of Birth: 08-Jun-1951 Admit Type: Outpatient Age: 67 Room: [facility] ENDO ROOM 2 Gender: Female Note Status: Finalized Procedure:            Colonoscopy Indications:          Chronic diarrhea, Hematochezia, h/o tubular adenoma and                        inflammatory polyps Providers:            Lin Landsman MD, MD Referring MD:         Einar Pheasant, MD (Referring MD) Medicines:            Monitored Anesthesia Care Complications:        No immediate complications. Estimated blood loss:                        Minimal. Procedure:            Pre-Anesthesia Assessment:                       - Prior to the procedure, a History and Physical was                        performed, and patient medications and allergies were                        reviewed. The patient is competent. The risks and                        benefits of the procedure and the sedation options and                        risks were discussed with the patient. All questions                        were answered and informed consent was obtained.                        Patient identification and proposed procedure were                        verified by the physician, the nurse, the                        anesthesiologist, the anesthetist and the technician in                        the pre-procedure area in the procedure room in the                        endoscopy suite. Mental Status Examination: alert and                        oriented. Airway Examination: normal oropharyngeal                        airway and neck mobility. Respiratory Examination:  clear to auscultation. CV Examination: normal.                        Prophylactic Antibiotics: The patient does not require                        prophylactic antibiotics. Prior Anticoagulants:  The                        patient has taken no previous anticoagulant or                        antiplatelet agents. ASA Grade Assessment: III - A                        patient with severe systemic disease. After reviewing                        the risks and benefits, the patient was deemed in                        satisfactory condition to undergo the procedure. The                        anesthesia plan was to use monitored anesthesia care                        (MAC). Immediately prior to administration of                        medications, the patient was re-assessed for adequacy                        to receive sedatives. The heart rate, respiratory rate,                        oxygen saturations, blood pressure, adequacy of                        pulmonary ventilation, and response to care were                        monitored throughout the procedure. The physical status                        of the patient was re-assessed after the procedure.                       After obtaining informed consent, the colonoscope was                        passed under direct vision. Throughout the procedure,                        the patient's blood pressure, pulse, and oxygen                        saturations were monitored continuously. The                        Colonoscope  was introduced through the anus and                        advanced to the 5 cm into the ileum. The colonoscopy                        was performed without difficulty. The patient tolerated                        the procedure well. The quality of the bowel                        preparation was evaluated using the BBPS [facility] Bowel                        Preparation Scale) with scores of: Right Colon = 3,                        Transverse Colon = 3 and Left Colon = 3 (entire mucosa                        seen well with no residual staining, small fragments of                        stool or opaque liquid). The  total BBPS score equals 9. Findings:      The perianal and digital rectal examinations were normal. Pertinent       negatives include normal sphincter tone and no palpable rectal lesions.      The terminal ileum appeared normal. Biopsies were taken with a cold       forceps for histology.      A 12 mm polyp was found in the ascending colon. The polyp was       semi-pedunculated. The polyp was removed with a hot snare. Resection and       retrieval were complete.      A 40 mm polyp was found in the transverse colon. The polyp was       pedunculated. The polyp was removed with a hot snare. Polyp resection       was incomplete. The resected tissue was retrieved. Previously placed       tattoo is noticed      Random colon biopsies were performed      A few semi-pedunculated, non-bleeding polyps were found in the sigmoid       colon. The polyps were 8 to 15 mm in size. Polypectomy was not attempted       due to the patient's condition which required stopping the procedure.      The retroflexed view of the distal rectum and anal verge was normal and       showed no anal or rectal abnormalities. Impression:           - The examined portion of the ileum was normal.                        Biopsied.                       - One 12 mm polyp in the ascending colon, removed with  a hot snare. Resected and retrieved.                       - One 40 mm polyp in the transverse colon, removed with                        a hot snare. Incomplete resection. Resected tissue                        retrieved.                       - A few 8 to 15 mm, non-bleeding polyps in the sigmoid                        colon. Resection not attempted.                       - The distal rectum and anal verge are normal on                        retroflexion view. Recommendation:       - Discharge patient to home.                       - Await pathology results.                       - Repeat  colonoscopy in 43month - 1 year for                        retreatment.                       - Return to my office as previously scheduled. Procedure Code(s):    --- Professional ---                       [id number] Colonoscopy, flexible; with removal of tumor(s),                        polyp(s), or other lesion(s) by snare technique                       45380, 576 Colonoscopy, flexible; with biopsy, single                        or multiple Diagnosis Code(s):    --- Professional ---                       D12.2, Benign neoplasm of ascending colon                       D12.3, Benign neoplasm of transverse colon (hepatic                        flexure or splenic flexure)                       D12.5, Benign neoplasm of sigmoid colon                       K52.9, Noninfective  gastroenteritis and colitis,                        unspecified                       K92.1, Melena (includes Hematochezia) CPT copyright 2016 American Medical Association. All rights reserved. The codes documented in this report are preliminary and upon coder review may  be revised to meet current compliance requirements. Dr. Ulyess Mort Lin Landsman MD, MD 12/27/2017 9:35:16 AM This report has been signed electronically. Number of Addenda: 0 Note Initiated On: 12/27/2017 8:12 AM Scope Withdrawal Time: 0 hours 58 minutes 6 seconds  Total Procedure Duration: 1 hour 1 minute 24 seconds       [facility]

## 2017-12-27 NOTE — Telephone Encounter (Signed)
Tried to reach patient by phone no answer and no voicemail full.

## 2017-12-27 NOTE — Telephone Encounter (Signed)
Copied from [place] [phone]. Topic: Inquiry >> Dec 27, 2017  2:28 PM Pricilla Handler wrote: Reason for CRM: Patient called stating that she has not received her new prescriptions for Potassium chloride (K-DUR) 10 MEQ tablet and Rosuvastatin (CRESTOR) 10 MG tablet. Patient states that both were increased by Dr. Nicki Reaper recently. Patient's preferred pharmacy is [facility], [place] [phone] (Phone) [phone] (Fax).

## 2017-12-27 NOTE — Anesthesia Preprocedure Evaluation (Signed)
Anesthesia Evaluation  Patient identified by MRN, date of birth, ID band Patient awake    Reviewed: Allergy & Precautions, H&P , NPO status , Patient's Chart, lab work & pertinent test results  History of Anesthesia Complications Negative for: history of anesthetic complications  Airway Mallampati: III  TM Distance: <3 FB Neck ROM: full    Dental  (+) Chipped, Poor Dentition, Missing   Pulmonary neg shortness of breath, asthma ,           Cardiovascular Exercise Tolerance: Good hypertension, (-) angina(-) Past MI and (-) DOE      Neuro/Psych negative neurological ROS  negative psych ROS   GI/Hepatic Neg liver ROS, hiatal hernia, GERD  Medicated and Controlled,  Endo/Other  diabetes, Type 2  Renal/GU Renal disease  negative genitourinary   Musculoskeletal   Abdominal   Peds  Hematology negative hematology ROS (+) anemia ,   Anesthesia Other Findings Past Medical History: No date: Anemia, unspecified No date: Asthma No date: Colitis No date: Diabetes mellitus (HCC) No date: Esophagitis No date: H/O hiatal hernia No date: Hx of arteriovenous malformation (AVM) No date: Hypertension No date: IBS (irritable bowel syndrome) No date: Nephrolithiasis No date: Pernicious anemia No date: Psoriatic arthritis (HCC)     Comment:  s/p penicillamine, plaquenil, MTX, sulfasalazine.  s/p               Embrel, Humira,.  Iritis.   No date: Pure hypercholesterolemia  Past Surgical History: 1981: ABDOMINAL HYSTERECTOMY     Comment:  ovaries left in place No date: ANKLE SURGERY     Comment:  right No date: BUNIONECTOMY 1989: CHOLECYSTECTOMY No date: HAND SURGERY     Comment:  right No date: HEEL SPUR SURGERY No date: NOSE SURGERY     Comment:  x2 No date: UMBILICAL HERNIA REPAIR  BMI    Body Mass Index:  30.21 kg/m      Reproductive/Obstetrics negative OB ROS                              Anesthesia Physical Anesthesia Plan  ASA: III  Anesthesia Plan: General   Post-op Pain Management:    Induction: Intravenous  PONV Risk Score and Plan: Propofol infusion and TIVA  Airway Management Planned: Natural Airway and Nasal Cannula  Additional Equipment:   Intra-op Plan:   Post-operative Plan:   Informed Consent: I have reviewed the patients History and Physical, chart, labs and discussed the procedure including the risks, benefits and alternatives for the proposed anesthesia with the patient or authorized representative who has indicated his/her understanding and acceptance.   Dental Advisory Given  Plan Discussed with: Anesthesiologist, CRNA and Surgeon  Anesthesia Plan Comments: (Patient consented for risks of anesthesia including but not limited to:  - adverse reactions to medications - risk of intubation if required - damage to teeth, lips or other oral mucosa - sore throat or hoarseness - Damage to heart, brain, lungs or loss of life  Patient voiced understanding.)        Anesthesia Quick Evaluation

## 2017-12-27 NOTE — Op Note (Signed)
[facility] Gastroenterology Patient Name: Ashley Gross Procedure Date: 12/27/2017 8:13 AM MRN: [id number] Account #: [id number] Date of Birth: 1951-02-13 Admit Type: Outpatient Age: 67 Room: [facility] ENDO ROOM 2 Gender: Female Note Status: Finalized Procedure:            Upper GI endoscopy Indications:          Diarrhea Providers:            Lin Landsman MD, MD Referring MD:         Einar Pheasant, MD (Referring MD) Medicines:            Monitored Anesthesia Care Complications:        No immediate complications. Estimated blood loss:                        Minimal. Procedure:            Pre-Anesthesia Assessment:                       - Prior to the procedure, a History and Physical was                        performed, and patient medications and allergies were                        reviewed. The patient is competent. The risks and                        benefits of the procedure and the sedation options and                        risks were discussed with the patient. All questions                        were answered and informed consent was obtained.                        Patient identification and proposed procedure were                        verified by the physician, the nurse, the                        anesthesiologist, the anesthetist and the technician in                        the pre-procedure area in the procedure room in the                        endoscopy suite. Mental Status Examination: alert and                        oriented. Airway Examination: normal oropharyngeal                        airway and neck mobility. Respiratory Examination:                        clear to auscultation. CV Examination: normal.  Prophylactic Antibiotics: The patient does not require                        prophylactic antibiotics. Prior Anticoagulants: The                        patient has taken no previous anticoagulant or                  antiplatelet agents. ASA Grade Assessment: III - A                        patient with severe systemic disease. After reviewing                        the risks and benefits, the patient was deemed in                        satisfactory condition to undergo the procedure. The                        anesthesia plan was to use monitored anesthesia care                        (MAC). Immediately prior to administration of                        medications, the patient was re-assessed for adequacy                        to receive sedatives. The heart rate, respiratory rate,                        oxygen saturations, blood pressure, adequacy of                        pulmonary ventilation, and response to care were                        monitored throughout the procedure. The physical status                        of the patient was re-assessed after the procedure.                       After obtaining informed consent, the endoscope was                        passed under direct vision. Throughout the procedure,                        the patient's blood pressure, pulse, and oxygen                        saturations were monitored continuously. The Endoscope                        was introduced through the mouth, and advanced to the                        second part of duodenum.  The upper GI endoscopy was                        accomplished without difficulty. The patient tolerated                        the procedure fairly well. Findings:      The duodenal bulb and second portion of the duodenum were normal.       Biopsies for histology were taken with a cold forceps for evaluation of       celiac disease.      Diffuse mildly erythematous mucosa without bleeding was found in the       gastric body and in the gastric antrum.      The cardia, gastric fundus and incisura were normal. Biopsies were taken       with a cold forceps for Helicobacter pylori testing. Estimated  blood       loss was minimal.      The gastroesophageal junction and examined esophagus were normal. Impression:           - Normal duodenal bulb and second portion of the                        duodenum. Biopsied.                       - Erythematous mucosa in the gastric body and antrum.                       - Normal cardia, gastric fundus and incisura. Biopsied.                       - Normal gastroesophageal junction and esophagus. Recommendation:       - Await pathology results.                       - Proceed with colonoscopy as scheduled                       See colonoscopy report Procedure Code(s):    --- Professional ---                       [phone], Esophagogastroduodenoscopy, flexible, transoral;                        with biopsy, single or multiple Diagnosis Code(s):    --- Professional ---                       K31.89, Other diseases of stomach and duodenum                       R19.7, Diarrhea, unspecified CPT copyright 2016 American Medical Association. All rights reserved. The codes documented in this report are preliminary and upon coder review may  be revised to meet current compliance requirements. Dr. Ulyess Mort Lin Landsman MD, MD 12/27/2017 8:24:10 AM This report has been signed electronically. Number of Addenda: 0 Note Initiated On: 12/27/2017 8:13 AM      [facility]

## 2017-12-27 NOTE — Anesthesia Post-op Follow-up Note (Signed)
Anesthesia QCDR form completed.        

## 2017-12-27 NOTE — Transfer of Care (Signed)
Immediate Anesthesia Transfer of Care Note  Patient: Ashley Gross  Procedure(s) Performed: COLONOSCOPY WITH PROPOFOL (N/A ) ESOPHAGOGASTRODUODENOSCOPY (EGD) WITH PROPOFOL (N/A )  Patient Location: PACU and Endoscopy Unit  Anesthesia Type:General  Level of Consciousness: awake, alert  and oriented  Airway & Oxygen Therapy: Patient Spontanous Breathing  Post-op Assessment: Report given to RN and Post -op Vital signs reviewed and stable  Post vital signs: Reviewed and stable  Last Vitals:  Vitals:   12/27/17 0738 12/27/17 0930  BP: 115/65 (!) 102/40  Pulse: 76 77  Resp: 17 17  Temp: (!) 36.1 C (!) 35.8 C  SpO2: 97% 97%    Last Pain:  Vitals:   12/27/17 0930  TempSrc: Tympanic         Complications: No apparent anesthesia complications

## 2017-12-27 NOTE — Telephone Encounter (Signed)
Copied from [place] [phone]. Topic: Inquiry >> Dec 27, 2017  2:28 PM Pricilla Handler wrote: Reason for CRM: Patient called stating that she has not received her new prescriptions for Potassium chloride (K-DUR) 10 MEQ tablet and Rosuvastatin (CRESTOR) 10 MG tablet. Patient states that both were increased by Dr. Nicki Reaper recently. Patient's preferred pharmacy is [facility], [place] [phone] (Phone) [phone] (Fax).

## 2017-12-28 ENCOUNTER — Encounter: Payer: Self-pay | Admitting: Gastroenterology

## 2017-12-28 ENCOUNTER — Other Ambulatory Visit (INDEPENDENT_AMBULATORY_CARE_PROVIDER_SITE_OTHER): Payer: PPO

## 2017-12-28 ENCOUNTER — Other Ambulatory Visit: Payer: Self-pay

## 2017-12-28 ENCOUNTER — Ambulatory Visit (INDEPENDENT_AMBULATORY_CARE_PROVIDER_SITE_OTHER): Payer: PPO | Admitting: *Deleted

## 2017-12-28 DIAGNOSIS — E538 Deficiency of other specified B group vitamins: Secondary | ICD-10-CM

## 2017-12-28 DIAGNOSIS — E876 Hypokalemia: Secondary | ICD-10-CM

## 2017-12-28 DIAGNOSIS — K529 Noninfective gastroenteritis and colitis, unspecified: Secondary | ICD-10-CM | POA: Diagnosis not present

## 2017-12-28 LAB — BASIC METABOLIC PANEL
BUN: 14 mg/dL (ref 6–23)
CHLORIDE: 109 meq/L (ref 96–112)
CO2: 24 meq/L (ref 19–32)
CREATININE: 1.11 mg/dL (ref 0.40–1.20)
Calcium: 8.8 mg/dL (ref 8.4–10.5)
GFR: 52.12 mL/min — ABNORMAL LOW (ref >60.00)
GLUCOSE: 118 mg/dL — AB (ref 70–99)
Potassium: 3.3 mEq/L — ABNORMAL LOW (ref 3.5–5.1)
SODIUM: 141 meq/L (ref 135–145)

## 2017-12-28 MED ORDER — CYANOCOBALAMIN 1000 MCG/ML IJ SOLN
1000.0000 ug | Freq: Once | INTRAMUSCULAR | Status: AC
  Administered 2017-12-28: 1000 ug via INTRAMUSCULAR

## 2017-12-28 MED ORDER — ROSUVASTATIN CALCIUM 10 MG PO TABS: 10.0000 mg | ORAL_TABLET | Freq: Every day | ORAL | 3 refills | Status: DC

## 2017-12-28 MED ORDER — POTASSIUM CHLORIDE ER 10 MEQ PO TBCR: EXTENDED_RELEASE_TABLET | ORAL | 0 refills | Status: DC

## 2017-12-28 NOTE — Telephone Encounter (Signed)
[facility] reports did not receive K-Dur or Crestor - resent.

## 2017-12-28 NOTE — Telephone Encounter (Signed)
Spoke with pt and will update med list while pt is here tomorrow. Pt has picked up medication

## 2017-12-28 NOTE — Progress Notes (Addendum)
Patient presented for B 12 injection to right deltoid, patient voiced no concerns nor showed any signs of distress during injection.  Reviewed.  Dr Scott 

## 2017-12-29 ENCOUNTER — Ambulatory Visit (INDEPENDENT_AMBULATORY_CARE_PROVIDER_SITE_OTHER): Payer: PPO | Admitting: Internal Medicine

## 2017-12-29 ENCOUNTER — Other Ambulatory Visit: Payer: Self-pay | Admitting: Internal Medicine

## 2017-12-29 DIAGNOSIS — R7989 Other specified abnormal findings of blood chemistry: Secondary | ICD-10-CM

## 2017-12-29 DIAGNOSIS — E876 Hypokalemia: Secondary | ICD-10-CM

## 2017-12-29 DIAGNOSIS — E7800 Pure hypercholesterolemia, unspecified: Secondary | ICD-10-CM

## 2017-12-29 DIAGNOSIS — F439 Reaction to severe stress, unspecified: Secondary | ICD-10-CM | POA: Diagnosis not present

## 2017-12-29 DIAGNOSIS — I10 Essential (primary) hypertension: Secondary | ICD-10-CM

## 2017-12-29 DIAGNOSIS — D518 Other vitamin B12 deficiency anemias: Secondary | ICD-10-CM | POA: Diagnosis not present

## 2017-12-29 DIAGNOSIS — K219 Gastro-esophageal reflux disease without esophagitis: Secondary | ICD-10-CM | POA: Diagnosis not present

## 2017-12-29 DIAGNOSIS — R945 Abnormal results of liver function studies: Secondary | ICD-10-CM

## 2017-12-29 DIAGNOSIS — L4050 Arthropathic psoriasis, unspecified: Secondary | ICD-10-CM | POA: Diagnosis not present

## 2017-12-29 DIAGNOSIS — K529 Noninfective gastroenteritis and colitis, unspecified: Secondary | ICD-10-CM | POA: Diagnosis not present

## 2017-12-29 DIAGNOSIS — E119 Type 2 diabetes mellitus without complications: Secondary | ICD-10-CM | POA: Diagnosis not present

## 2017-12-29 LAB — SURGICAL PATHOLOGY

## 2017-12-29 MED ORDER — POTASSIUM CHLORIDE ER 10 MEQ PO TBCR: 10.0000 meq | EXTENDED_RELEASE_TABLET | Freq: Two times a day (BID) | ORAL | 0 refills | Status: DC

## 2017-12-29 MED ORDER — ROSUVASTATIN CALCIUM 10 MG PO TABS: 10.0000 mg | ORAL_TABLET | Freq: Every day | ORAL | 0 refills | Status: DC

## 2017-12-29 NOTE — Progress Notes (Signed)
Patient ID: Ashley Gross, female   DOB: [birth year], 67 y.o.   MRN: [id number]   Subjective:    Patient ID: Ashley Gross, female    DOB: Apr 15, 1951, 66 y.o.   MRN: [id number]  HPI  Patient here for a scheduled follow up.  She is feeling better.  No increased cough or congestion.  No acid reflux.  No chest pain.  Breathing stable.  Still having bowel issues.  Still with diarrhea.  Seeing GI.  Colonoscopy recently as outlined.  Planning to f/u regarding results.  Recent a1c elevated.  She has adjusted her diet.  Sugars much improved.  Reviewed attached readings.  Sugars now averaging 100-130s.  Overall feels she is doing better.     Past Medical History:  Diagnosis Date  . Anemia, unspecified   . Asthma   . Colitis   . Diabetes mellitus ([place])   . Esophagitis   . H/O hiatal hernia   . Hx of arteriovenous malformation (AVM)   . Hypertension   . IBS (irritable bowel syndrome)   . Nephrolithiasis   . Pernicious anemia   . Psoriatic arthritis ([facility])    s/p penicillamine, plaquenil, MTX, sulfasalazine.  s/p Embrel, Humira,.  Iritis.    . Pure hypercholesterolemia    Past Surgical History:  Procedure Laterality Date  . ABDOMINAL HYSTERECTOMY  1981   ovaries left in place  . ANKLE SURGERY     right  . BUNIONECTOMY    . CHOLECYSTECTOMY  1989  . COLONOSCOPY WITH PROPOFOL N/A 12/27/2017   Procedure: COLONOSCOPY WITH PROPOFOL;  Surgeon: Lin Landsman, MD;  Location: [facility] ENDOSCOPY;  Service: Gastroenterology;  Laterality: N/A;  . ESOPHAGOGASTRODUODENOSCOPY (EGD) WITH PROPOFOL N/A 12/27/2017   Procedure: ESOPHAGOGASTRODUODENOSCOPY (EGD) WITH PROPOFOL;  Surgeon: Lin Landsman, MD;  Location: [facility] ENDOSCOPY;  Service: Gastroenterology;  Laterality: N/A;  . HAND SURGERY     right  . HEEL SPUR SURGERY    . NOSE SURGERY     x2  . UMBILICAL HERNIA REPAIR     Family History  Problem Relation Age of Onset  . Diabetes Other   . Hypertension Other   . Breast cancer Neg Hx   .  Colon cancer Neg Hx    Social History   Socioeconomic History  . Marital status: Married    Spouse name: None  . Number of children: 2  . Years of education: None  . Highest education level: None  Social Needs  . Financial resource strain: None  . Food insecurity - worry: None  . Food insecurity - inability: None  . Transportation needs - medical: None  . Transportation needs - non-medical: None  Occupational History  . None  Tobacco Use  . Smoking status: Never Smoker  . Smokeless tobacco: Never Used  Substance and Sexual Activity  . Alcohol use: No    Alcohol/week: 0.0 oz  . Drug use: No  . Sexual activity: Not Currently  Other Topics Concern  . None  Social History Narrative   She is married and has two children    Outpatient Encounter Medications as of 12/29/2017  Medication Sig  . albuterol (ACCUNEB) 0.63 MG/3ML nebulizer solution Take 3 mLs (0.63 mg total) by nebulization every 6 (six) hours as needed for wheezing.  Marland Kitchen amLODipine (NORVASC) 5 MG tablet take 1 tablet by mouth twice a day  . Calcium Carbonate-Vit D-Min (CALCIUM 600+D PLUS MINERALS) 600-400 MG-UNIT TABS Take by mouth.  . Cholecalciferol (VITAMIN D-3) 1000 UNITS CAPS  Take 1 capsule by mouth daily.  . citalopram (CELEXA) 10 MG tablet TAKE 1 TABLET BY MOUTH ONCE DAILY  . dicyclomine (BENTYL) 20 MG tablet Take 1 tablet (20 mg total) by mouth every 6 (six) hours.  . diphenhydrAMINE (BENADRYL) 25 mg capsule Take by mouth.  . DOCOSAHEXAENOIC ACID PO Take by mouth.  . Fluticasone-Salmeterol (ADVAIR DISKUS) 250-50 MCG/DOSE AEPB Inhale into the lungs.  Marland Kitchen glipiZIDE (GLUCOTROL XL) 5 MG 24 hr tablet TAKE 1 TABLET BY MOUTH EVERY DAY  . ipratropium (ATROVENT HFA) 17 MCG/ACT inhaler Inhale 2 puffs into the lungs every 6 (six) hours.  Marland Kitchen lisinopril (PRINIVIL,ZESTRIL) 40 MG tablet TAKE 1 TABLET BY MOUTH ONCE DAILY  . loperamide (IMODIUM) 2 MG capsule Take by mouth.  . metFORMIN (GLUCOPHAGE) 1000 MG tablet TAKE 1 TABLET BY  MOUTH TWICE A DAY WITH MEALS  . omeprazole (PRILOSEC) 20 MG capsule TAKE 1 CAPSULE BY MOUTH ONCE DAILY  . ONE TOUCH ULTRA TEST test strip PATIENT NEEDS NEW METER STRIPS AND LANCETS FOR ONE TOUCH. HER INSURANCE NO LONGER COVERS ACCUCHEK.  . pioglitazone (ACTOS) 15 MG tablet TAKE 1 TABLET BY MOUTH ONCE DAILY  . potassium chloride (K-DUR) 10 MEQ tablet Take 1 tablet (10 mEq total) by mouth 2 (two) times daily.  Marland Kitchen PROCTOZONE-HC 2.5 % rectal cream   . rosuvastatin (CRESTOR) 10 MG tablet Take 1 tablet (10 mg total) by mouth daily.  Marland Kitchen triamterene-hydrochlorothiazide (MAXZIDE-25) 37.5-25 MG tablet Take 0.5 tablets by mouth daily.  . [DISCONTINUED] potassium chloride (K-DUR) 10 MEQ tablet Take 1 tablet bid x 3 days and then q day. (Patient taking differently: Take 10 mEq by mouth daily. Take 1 tablet daily.)  . [DISCONTINUED] rosuvastatin (CRESTOR) 10 MG tablet Take 1 tablet (10 mg total) by mouth daily. (Patient taking differently: Take 20 mg by mouth daily. )   No facility-administered encounter medications on file as of 12/29/2017.     Review of Systems  Constitutional: Negative for appetite change and unexpected weight change.  HENT: Negative for congestion and sinus pressure.   Respiratory: Negative for cough, chest tightness and shortness of breath.   Cardiovascular: Negative for chest pain, palpitations and leg swelling.  Gastrointestinal: Positive for diarrhea. Negative for nausea and vomiting.  Genitourinary: Negative for difficulty urinating and dysuria.  Musculoskeletal: Negative for joint swelling and myalgias.  Skin: Negative for color change and rash.  Neurological: Negative for dizziness, light-headedness and headaches.  Psychiatric/Behavioral: Negative for agitation and dysphoric mood.       Objective:    Physical Exam  Constitutional: She appears well-developed and well-nourished. No distress.  HENT:  Nose: Nose normal.  Mouth/Throat: Oropharynx is clear and moist.  Neck:  Neck supple. No thyromegaly present.  Cardiovascular: Normal rate and regular rhythm.  Pulmonary/Chest: Breath sounds normal. No respiratory distress. She has no wheezes.  Abdominal: Soft. Bowel sounds are normal. There is no tenderness.  Musculoskeletal: She exhibits no edema or tenderness.  Lymphadenopathy:    She has no cervical adenopathy.  Skin: No rash noted. No erythema.  Psychiatric: She has a normal mood and affect. Her behavior is normal.    BP 118/68 (BP Location: Left Arm, Patient Position: Sitting, Cuff Size: Normal)   Pulse 72   Temp 98.4 F (36.9 C) (Oral)   Resp 20   Wt 175 lb 6.4 oz (79.6 kg)   SpO2 98%   BMI 30.11 kg/m  Wt Readings from Last 3 Encounters:  12/29/17 175 lb 6.4 oz (79.6 kg)  12/27/17  176 lb (79.8 kg)  12/06/17 176 lb (79.8 kg)     Lab Results  Component Value Date   WBC 6.9 12/06/2017   HGB 13.2 12/06/2017   HCT 40.2 12/06/2017   PLT 245.0 12/06/2017   GLUCOSE 118 (H) 12/28/2017   CHOL 233 (H) 12/06/2017   TRIG 327.0 (H) 12/06/2017   HDL 60.10 12/06/2017   LDLDIRECT 120.0 12/06/2017   LDLCALC 124 (H) 07/08/2017   ALT 22 12/06/2017   AST 25 12/06/2017   NA 141 12/28/2017   K 3.3 (L) 12/28/2017   CL 109 12/28/2017   CREATININE 1.11 12/28/2017   BUN 14 12/28/2017   CO2 24 12/28/2017   TSH 2.68 07/08/2017   HGBA1C 8.3 (H) 12/06/2017   MICROALBUR 3.5 (H) 03/16/2017       Assessment & Plan:   Problem List Items Addressed This Visit    Abnormal liver function test    Follow liver function tests.        Anemia    Follow cbc.       Chronic diarrhea    Persistent.  Seeing GI.  Just had colonoscopy.  Planning for f/u.        Diabetes mellitus ([place])    Low carb diet and exercise.  Follow met b and a1c.  Recent a1c elevated.  She has adjusted her diet.  Sugars much improved.  Hold on making any changes.  Follow.       Relevant Medications   rosuvastatin (CRESTOR) 10 MG tablet   GERD (gastroesophageal reflux disease)     Upper symptoms controlled.        Hypercholesterolemia    On crestor.  Low cholesterol diet and exercise.  Follow lipid panel and liver function tests.        Relevant Medications   rosuvastatin (CRESTOR) 10 MG tablet   Hypertension    Blood pressure under good control.  Continue same medication regimen.  Follow pressures.  Follow metabolic panel.        Relevant Medications   rosuvastatin (CRESTOR) 10 MG tablet   Psoriatic arthritis ([place])    Followed by Dr Jefm Bryant.        Stress    Overall handling things well.  Follow.  Does not feel needs anything more at this time.  Follow.            Einar Pheasant, MD

## 2017-12-29 NOTE — Progress Notes (Signed)
Order placed for f/u labs.  

## 2018-01-01 ENCOUNTER — Encounter: Payer: Self-pay | Admitting: Internal Medicine

## 2018-01-01 NOTE — Assessment & Plan Note (Signed)
Follow cbc.  

## 2018-01-01 NOTE — Assessment & Plan Note (Signed)
Upper symptoms controlled.  

## 2018-01-01 NOTE — Assessment & Plan Note (Signed)
Low carb diet and exercise.  Follow met b and a1c.  Recent a1c elevated.  She has adjusted her diet.  Sugars much improved.  Hold on making any changes.  Follow.

## 2018-01-01 NOTE — Assessment & Plan Note (Signed)
On crestor.  Low cholesterol diet and exercise.  Follow lipid panel and liver function tests.   

## 2018-01-01 NOTE — Assessment & Plan Note (Signed)
Blood pressure under good control.  Continue same medication regimen.  Follow pressures.  Follow metabolic panel.   

## 2018-01-01 NOTE — Assessment & Plan Note (Signed)
Follow liver function tests.   

## 2018-01-01 NOTE — Assessment & Plan Note (Signed)
Persistent.  Seeing GI.  Just had colonoscopy.  Planning for f/u.

## 2018-01-01 NOTE — Assessment & Plan Note (Signed)
Overall handling things well.  Follow.  Does not feel needs anything more at this time.  Follow.

## 2018-01-01 NOTE — Assessment & Plan Note (Signed)
Followed by Dr Kernodle.   

## 2018-01-03 ENCOUNTER — Ambulatory Visit: Payer: Self-pay | Admitting: *Deleted

## 2018-01-03 ENCOUNTER — Encounter: Payer: Self-pay | Admitting: Internal Medicine

## 2018-01-03 DIAGNOSIS — R6889 Other general symptoms and signs: Secondary | ICD-10-CM | POA: Diagnosis not present

## 2018-01-03 DIAGNOSIS — R05 Cough: Secondary | ICD-10-CM | POA: Diagnosis not present

## 2018-01-03 DIAGNOSIS — J45901 Unspecified asthma with (acute) exacerbation: Secondary | ICD-10-CM | POA: Diagnosis not present

## 2018-01-03 DIAGNOSIS — J209 Acute bronchitis, unspecified: Secondary | ICD-10-CM | POA: Diagnosis not present

## 2018-01-03 NOTE — Telephone Encounter (Signed)
Patient stated she would go to [facility] to be evaluated.

## 2018-01-03 NOTE — Telephone Encounter (Signed)
If temp 101,with her history, she needs to go ahead and be evaluated.

## 2018-01-03 NOTE — Telephone Encounter (Signed)
Pt evaluated at [facility].

## 2018-01-03 NOTE — Telephone Encounter (Signed)
Patient was triaged by [facility] and scheduled for an appointment with Almyra Free on Thursday.

## 2018-01-03 NOTE — Telephone Encounter (Signed)
If having fever of 101.8, needs to be evaluated.  Recommend acute care today - then can f/u here if needed.

## 2018-01-03 NOTE — Telephone Encounter (Signed)
PEC may triage patient and advise PCP of symptoms

## 2018-01-03 NOTE — Telephone Encounter (Signed)
Tried to reach patient by phone no answer

## 2018-01-03 NOTE — Telephone Encounter (Signed)
Pt states she has been having an elevated temp that happens at night and then she has night sweats. She also states she has a cough that seems to be worst at night. Also she has been having a headache. She has no n/v. No other symptoms. She is a diabetic and stated her blood sugars have been good.  She did have a colonoscopy on Monday and the fever started on Wednesday evening.  Appointment made for Thursday.  Home care advice given to patient with verbal understanding.  Reason for Disposition . [1] Intermittent fever > 100.0 F (37.8 C) AND [2] lasts > 3 weeks  Answer Assessment - Initial Assessment Questions 1. TEMPERATURE: "What is the most recent temperature?"  "How was it measured?"      98.1 with an oral thermometer, usually starts climbing when 4:30 and 5:30 2. ONSET: "When did the fever start?"      Last Wednesday 3. SYMPTOMS: "Do you have any other symptoms besides the fever?"  (e.g., colds, headache, sore throat, earache, cough, rash, diarrhea, vomiting, abdominal pain)     Headache, cough 4. CAUSE: If there are no symptoms, ask: "What do you think is causing the fever?"      Not sure 5. CONTACTS: "Does anyone else in the family have an infection?"     no 6. TREATMENT: "What have you done so far to treat this fever?" (e.g., medications)     Took tylenol 7. IMMUNOCOMPROMISE: "Do you have of the following: diabetes, HIV positive, splenectomy, cancer chemotherapy, chronic steroid treatment, transplant patient, etc."     Diabetes 8. PREGNANCY: "Is there any chance you are pregnant?" "When was your last menstrual period?"     no 9. TRAVEL: "Have you traveled out of the country in the last month?" (e.g., travel history, exposures)     no  Protocols used: FEVER-A-AH

## 2018-01-06 ENCOUNTER — Ambulatory Visit: Payer: PPO | Admitting: Family Medicine

## 2018-01-10 ENCOUNTER — Other Ambulatory Visit: Payer: Self-pay | Admitting: Internal Medicine

## 2018-01-11 ENCOUNTER — Ambulatory Visit: Payer: Self-pay

## 2018-01-11 ENCOUNTER — Encounter: Payer: Self-pay | Admitting: Family Medicine

## 2018-01-11 ENCOUNTER — Ambulatory Visit (INDEPENDENT_AMBULATORY_CARE_PROVIDER_SITE_OTHER): Payer: PPO | Admitting: Family Medicine

## 2018-01-11 VITALS — BP 124/80 | HR 92 | Temp 98.0°F | Wt 177.8 lb

## 2018-01-11 DIAGNOSIS — J4520 Mild intermittent asthma, uncomplicated: Secondary | ICD-10-CM | POA: Diagnosis not present

## 2018-01-11 MED ORDER — ALBUTEROL SULFATE 0.63 MG/3ML IN NEBU: 1.0000 | INHALATION_SOLUTION | Freq: Four times a day (QID) | RESPIRATORY_TRACT | 1 refills | Status: DC | PRN

## 2018-01-11 MED ORDER — HYDROCODONE-HOMATROPINE 5-1.5 MG/5ML PO SYRP
5.0000 mL | ORAL_SOLUTION | Freq: Three times a day (TID) | ORAL | 0 refills | Status: DC | PRN
Start: 2018-01-11 — End: 2018-03-14

## 2018-01-11 NOTE — Patient Instructions (Addendum)
Please refill Advair. Cough syrup has been provided for use at night. Please use this sparingly as this can make you sleepy as we discussed.  You can use your nebulizer if you develop wheezing.  If symptoms do not improve with treatment, worsen, or you develop new symptoms, please follow up for further evaluation and treatment.  Please keep your scheduled follow up appointment with Dr. Nicki Reaper.   Acute Bronchitis, Adult Acute bronchitis is when air tubes (bronchi) in the lungs suddenly get swollen. The condition can make it hard to breathe. It can also cause these symptoms:  A cough.  Coughing up clear, yellow, or green mucus.  Wheezing.  Chest congestion.  Shortness of breath.  A fever.  Body aches.  Chills.  A sore throat.  Follow these instructions at home: Medicines  Take over-the-counter and prescription medicines only as told by your doctor.  If you were prescribed an antibiotic medicine, take it as told by your doctor. Do not stop taking the antibiotic even if you start to feel better. General instructions  Rest.  Drink enough fluids to keep your pee (urine) clear or pale yellow.  Avoid smoking and secondhand smoke. If you smoke and you need help quitting, ask your doctor. Quitting will help your lungs heal faster.  Use an inhaler, cool mist vaporizer, or humidifier as told by your doctor.  Keep all follow-up visits as told by your doctor. This is important. How is this prevented? To lower your risk of getting this condition again:  Wash your hands often with soap and water. If you cannot use soap and water, use hand sanitizer.  Avoid contact with people who have cold symptoms.  Try not to touch your hands to your mouth, nose, or eyes.  Make sure to get the flu shot every year.  Contact a doctor if:  Your symptoms do not get better in 2 weeks. Get help right away if:  You cough up blood.  You have chest pain.  You have very bad shortness of  breath.  You become dehydrated.  You faint (pass out) or keep feeling like you are going to pass out.  You keep throwing up (vomiting).  You have a very bad headache.  Your fever or chills gets worse. This information is not intended to replace advice given to you by your health care provider. Make sure you discuss any questions you have with your health care provider. Document Released: 04/06/2008 Document Revised: 05/27/2016 Document Reviewed: 04/08/2016 Elsevier Interactive Patient Education  [employer].

## 2018-01-11 NOTE — Progress Notes (Signed)
Patient ID: Ashley Gross, female   DOB: [birth year], 67 y.o.   MRN: [id number]  PCP: Einar Pheasant, MD  Subjective:  Ashley Gross is a 67 y.o. year old very pleasant female patient who presents with  symptoms including nasal congestion, productive cough, and  chest congestion.  - does not have wheeze today; reports one episode of wheezing last night. -started: 2 weeks ago , symptoms are improving however she reported one episode of wheezing that was relieved with one nebulizer treatment. -previous treatments: Prednisone taper, Robitussin day and night have provided moderate benefit. She reports using her albuterol inhaler 2 times/day. She has completed Augmentin also -sick contacts/travel/risks: denies flu exposure. No sick contact exposure. Influenza is UTD.  She was evaluated and treated on 01/03/18,  which related to an asthma exacerbation. She was provided Augmentin, and prednisone which have provided benefit  She has not been taking her Atrovent stating she needs to get a refill and will do so today. She stated she was not sure if she should continue this medication with those that were prescribed on 01/03/18.   ROS-denies fever, NVD, abdominal pain, tooth pain, chest pain, palpitations, stridor. Denies significant shortness of breath.   Pertinent Past Medical History- Asthma Patient Active Problem List   Diagnosis Date Noted  . Bleeding internal hemorrhoids 09/13/2017  . 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 Rectal bleeding 07/15/2017  . Elevated serum creatinine 10/11/2016  . Bilateral lower extremity edema 08/28/2016  . Low back pain 03/29/2016  . Ankle fracture 05/02/2015  . Right shoulder pain 03/12/2015  . Right ankle pain 03/12/2015  . Stress 12/12/2014  . Health care maintenance 12/12/2014  . Exocrine pancreatic insufficiency 05/25/2013  . Weight loss, non-intentional 05/25/2013  . Abdominal pain 02/16/2013  . Chronic  diarrhea 02/16/2013  . 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 Personal history of colonic polyps 02/10/2013  . Osteoporosis 10/06/2012  . Anemia 10/06/2012  . Abnormal liver function test 10/06/2012  . Hypertension 10/06/2012  . GERD (gastroesophageal reflux disease) 10/06/2012  . AVM (arteriovenous malformation) of colon 10/06/2012  . Hypercholesterolemia 10/06/2012  . Diabetes mellitus ([place]) 10/06/2012  . Choledocholithiasis 06/29/2012  . Elevated liver enzymes 06/29/2012  . Psoriatic arthritis ([place]) 06/29/2012    Medications- reviewed  Current Outpatient Medications  Medication Sig Dispense Refill  . albuterol (ACCUNEB) 0.63 MG/3ML nebulizer solution Take 3 mLs (0.63 mg total) by nebulization every 6 (six) hours as needed for wheezing. 75 mL 1  . amLODipine (NORVASC) 5 MG tablet take 1 tablet by mouth twice a day 60 tablet 11  . Calcium Carbonate-Vit D-Min (CALCIUM 600+D PLUS MINERALS) 600-400 MG-UNIT TABS Take by mouth.    . Cholecalciferol (VITAMIN D-3) 1000 UNITS CAPS Take 1 capsule by mouth daily.    . citalopram (CELEXA) 10 MG tablet TAKE 1 TABLET BY MOUTH ONCE DAILY 90 tablet 0  . diphenhydrAMINE (BENADRYL) 25 mg capsule Take by mouth.    . DOCOSAHEXAENOIC ACID PO Take by mouth.    . Fluticasone-Salmeterol (ADVAIR DISKUS) 250-50 MCG/DOSE AEPB Inhale into the lungs.    Marland Kitchen glipiZIDE (GLUCOTROL XL) 5 MG 24 hr tablet TAKE 1 TABLET BY MOUTH EVERY DAY 90 tablet 0  . ipratropium (ATROVENT HFA) 17 MCG/ACT inhaler Inhale 2 puffs into the lungs every 6 (six) hours.    Marland Kitchen lisinopril (PRINIVIL,ZESTRIL) 40 MG tablet TAKE 1 TABLET BY MOUTH ONCE DAILY 30 tablet 0  . loperamide (IMODIUM) 2 MG capsule Take by  mouth.    . metFORMIN (GLUCOPHAGE) 1000 MG tablet TAKE 1 TABLET BY MOUTH TWICE A DAY WITH MEALS 180 tablet 0  . omeprazole (PRILOSEC) 20 MG capsule TAKE 1 CAPSULE BY MOUTH ONCE DAILY 90 capsule 0  . ONE TOUCH ULTRA TEST test strip PATIENT NEEDS NEW METER STRIPS AND LANCETS FOR  ONE TOUCH. HER INSURANCE NO LONGER COVERS ACCUCHEK. 100 each PRN  . pioglitazone (ACTOS) 15 MG tablet TAKE 1 TABLET BY MOUTH ONCE DAILY 90 tablet 0  . potassium chloride (K-DUR) 10 MEQ tablet Take 1 tablet (10 mEq total) by mouth 2 (two) times daily. 60 tablet 0  . PROCTOZONE-HC 2.5 % rectal cream     . rosuvastatin (CRESTOR) 10 MG tablet Take 1 tablet (10 mg total) by mouth daily. 90 tablet 0  . triamterene-hydrochlorothiazide (MAXZIDE-25) 37.5-25 MG tablet Take 0.5 tablets by mouth daily. 30 tablet 11  . dicyclomine (BENTYL) 20 MG tablet Take 1 tablet (20 mg total) by mouth every 6 (six) hours. 120 tablet 0   No current facility-administered medications for this visit.     Objective: BP 124/80 (BP Location: Left Arm, Patient Position: Sitting, Cuff Size: Normal)   Pulse 92   Temp 98 F (36.7 C) (Oral)   Wt 177 lb 12.8 oz (80.6 kg)   BMI 30.52 kg/m  Gen: NAD, resting comfortably HEENT: oropharynx clear and moist, no sinus tenderness CV: RRR no murmurs rubs or gallops Lungs: CTAB no crackles, wheeze, rhonchi  Ext: no edema Skin: warm, dry, no rash  Assessment/Plan: 1. Mild intermittent asthmatic bronchitis without complication Symptoms have improved with prednisone taper and Augmentin.  VSS, symptoms improving, and benign lung exam make pneumonia unlikely.  Suspect that her cough and one episode of wheezing is likely related to her discontinuing Advair. Reviewed mechanism of action of Advair and daily use today. She will refill and restart Advair. Bronchitis is improving. Provided refill for nebulizer to use if needed. Further reviewed the importance of close monitoring and follow up. If she is using albuterol more than twice weekly, advise follow up with PCP. Provided cough suppressant and advised to use this sparingly as this causes drowsiness.  Return precautions provided. She will keep follow up appointment with PCP    - albuterol (ACCUNEB) 0.63 MG/3ML nebulizer solution; Take 3  mLs (0.63 mg total) by nebulization every 6 (six) hours as needed for wheezing.  Dispense: 75 mL; Refill: 1 - HYDROcodone-homatropine (HYCODAN) 5-1.5 MG/5ML syrup; Take 5 mLs by mouth every 8 (eight) hours as needed for cough.  Dispense: 120 mL; Refill: [place], FNP

## 2018-01-11 NOTE — Telephone Encounter (Signed)
Pt calling with c/o persistent wheezing despite Albuterol nebulizer every four hours. Pt denies chest pain  Pt c/o frequent cough due to chest tightness. She denies fever. Pt was seen at [facility] on 01/03/18 and was rx Augnmentin and tessalon Perles and prednisone. Due to the wheezing, protocol advises the UCC. Called Flow Coordinator  Kerin Salen to discuss UCC vs OV. Juliann Pulse was able to work pt in with Almira Coaster PA today at 4:00 pm.  Reason for Disposition . Wheezing is present    Soke with Kerin Salen at PCP office who would like her to come to PCP for OV. Appt made today at 4:00 pm.  Answer Assessment - Initial Assessment Questions 1. ONSET: "When did the cough begin?"      12/31/17 2. SEVERITY: "How bad is the cough today?"  Frequent cough 3. RESPIRATORY DISTRESS: "Describe your breathing."      Wheezing, pt feels that her chest is tight 4. FEVER: "Do you have a fever?" If so, ask: "What is your temperature, how was it measured, and when did it start?"     No fever today 5. SPUTUM: "Describe the color of your sputum" (clear, white, yellow, green)      white 6. HEMOPTYSIS: "Are you coughing up any blood?" If so ask: "How much?" (flecks, streaks, tablespoons, etc.)     no 7. CARDIAC HISTORY: "Do you have any history of heart disease?" (e.g., heart attack, congestive heart failure)      HTN, h/o tachycardia 8. LUNG HISTORY: "Do you have any history of lung disease?"  (e.g., pulmonary embolus, asthma, emphysema)     asthma 9. PE RISK FACTORS: "Do you have a history of blood clots?" (or: recent major surgery, recent prolonged travel, bedridden ) no 10. OTHER SYMPTOMS: "Do you have any other symptoms?" (e.g., runny nose, wheezing, chest pain)       Wheezing using Albuterol neb Q 4 hours 11. PREGNANCY: "Is there any chance you are pregnant?" "When was your last menstrual period?"       n/a 12. TRAVEL: "Have you traveled out of the country in the last month?" (e.g., travel history,  exposures)       no  Protocols used: [place]

## 2018-01-12 ENCOUNTER — Encounter: Payer: Self-pay | Admitting: Internal Medicine

## 2018-01-12 ENCOUNTER — Telehealth: Payer: Self-pay | Admitting: Internal Medicine

## 2018-01-12 NOTE — Telephone Encounter (Signed)
Copied from [place] [id number]. Topic: Quick Communication - Rx Refill/Question >> Jan 12, 2018 10:14 AM Ahmed Prima L wrote: Medication: Advair 250 inhaler  Has the patient contacted their pharmacy? yes   (Agent: If no, request that the patient contact the pharmacy for the refill.)   Preferred Pharmacy (with phone number or street name): [id number] - , [place] - [address]   Agent: Please be advised that RX refills may take up to 3 business days. We ask that you follow-up with your pharmacy.

## 2018-01-12 NOTE — Telephone Encounter (Signed)
Patient instructed to refill Advair at appointment yesterday- Rx is historical and does not have current provider  LOV: 01/11/18     PCP: [place]: verified

## 2018-01-13 MED ORDER — FLUTICASONE-SALMETEROL 250-50 MCG/DOSE IN AEPB: 1.0000 | INHALATION_SPRAY | Freq: Two times a day (BID) | RESPIRATORY_TRACT | 3 refills | Status: DC

## 2018-01-13 NOTE — Telephone Encounter (Signed)
Pt checking status on Advair being called in to the pharmacy per request of Delano Metz.

## 2018-01-13 NOTE — Telephone Encounter (Signed)
I have sent in rx for advair to [facility].

## 2018-01-14 NOTE — Telephone Encounter (Signed)
Agree with evaluation, especially given persistent fever after colonoscopy.

## 2018-01-14 NOTE — Telephone Encounter (Signed)
Patient agreed to go be re-evaluated at the walk-in. Patient also stated that she has been running low grade fevers since 2 days after her colonoscopy. Advised patient to go be re-evaluated and I would follow up with her on Monday to see how she is feeling.

## 2018-01-14 NOTE — Telephone Encounter (Signed)
Given persistent symptoms (fever, etc), I feel she needs to be reevaluated.  Today is Friday and I am unable to work in today due to schedule.  I can work her in next week, but given fever, etc - would recommend reevaluation now and confirm no persistent infection or if something more needed.

## 2018-01-14 NOTE — Telephone Encounter (Signed)
Spoke with patient. She has picked up her advair and is using it but wanted you to be aware that she is still running fevers every night over 100. She is taking the cough syrup that was given to her by Almyra Free. States she is not having any new symptoms. She says the cough syrup is helping but is a little concerned about why she is running fevers every night. She was seen on 01/11/2018.

## 2018-01-17 ENCOUNTER — Ambulatory Visit
Admission: RE | Admit: 2018-01-17 | Discharge: 2018-01-17 | Disposition: A | Discharge status: Home / Self Care | Payer: PPO | Source: Ambulatory Visit | Attending: Internal Medicine | Admitting: Internal Medicine

## 2018-01-17 DIAGNOSIS — Z1239 Encounter for other screening for malignant neoplasm of breast: Secondary | ICD-10-CM

## 2018-01-17 DIAGNOSIS — Z1231 Encounter for screening mammogram for malignant neoplasm of breast: Secondary | ICD-10-CM | POA: Diagnosis not present

## 2018-01-17 NOTE — Telephone Encounter (Signed)
Called patient to follow up. She did not go to UC over the weekend. NO new symptoms. Still only running a fever at night. Advised patient that I would call her back and let her know if Dr. Nicki Reaper wanted her to be seen. Advised patient that it could be in the morning and she should follow up with the MD that did her colonoscopy due to fevers.

## 2018-01-17 NOTE — Telephone Encounter (Signed)
Yes.  Agree with need for reevaluation and needs to call GI back to inform of fevers.  Respiratory symptoms improved.  No other symptoms.  Low grade fever.  Agree with need to let GI know and agree with need for evaluation as outlined.

## 2018-01-18 NOTE — Telephone Encounter (Signed)
LMTCB

## 2018-02-01 ENCOUNTER — Ambulatory Visit (INDEPENDENT_AMBULATORY_CARE_PROVIDER_SITE_OTHER): Payer: PPO

## 2018-02-01 DIAGNOSIS — D518 Other vitamin B12 deficiency anemias: Secondary | ICD-10-CM | POA: Diagnosis not present

## 2018-02-01 MED ORDER — CYANOCOBALAMIN 1000 MCG/ML IJ SOLN
1000.0000 ug | Freq: Once | INTRAMUSCULAR | Status: AC
  Administered 2018-02-01: 1000 ug via INTRAMUSCULAR

## 2018-02-01 NOTE — Progress Notes (Addendum)
b 12 injection given in left arm Patient tolerated well.  Reviewed.  Dr Nicki Reaper

## 2018-02-07 ENCOUNTER — Encounter: Payer: Self-pay | Admitting: Gastroenterology

## 2018-02-08 DIAGNOSIS — E119 Type 2 diabetes mellitus without complications: Secondary | ICD-10-CM | POA: Diagnosis not present

## 2018-02-09 ENCOUNTER — Other Ambulatory Visit: Payer: Self-pay | Admitting: Internal Medicine

## 2018-02-11 ENCOUNTER — Other Ambulatory Visit: Payer: Self-pay | Admitting: Internal Medicine

## 2018-02-15 DIAGNOSIS — M7752 Other enthesopathy of left foot: Secondary | ICD-10-CM | POA: Diagnosis not present

## 2018-02-15 DIAGNOSIS — Q6689 Other  specified congenital deformities of feet: Secondary | ICD-10-CM | POA: Diagnosis not present

## 2018-02-15 DIAGNOSIS — M7751 Other enthesopathy of right foot: Secondary | ICD-10-CM | POA: Diagnosis not present

## 2018-02-15 LAB — HM DIABETES EYE EXAM

## 2018-02-17 ENCOUNTER — Other Ambulatory Visit: Payer: Self-pay | Admitting: Internal Medicine

## 2018-03-07 ENCOUNTER — Encounter: Payer: Self-pay | Admitting: Internal Medicine

## 2018-03-07 ENCOUNTER — Other Ambulatory Visit (INDEPENDENT_AMBULATORY_CARE_PROVIDER_SITE_OTHER): Payer: PPO

## 2018-03-07 DIAGNOSIS — R7989 Other specified abnormal findings of blood chemistry: Secondary | ICD-10-CM

## 2018-03-07 DIAGNOSIS — E876 Hypokalemia: Secondary | ICD-10-CM | POA: Diagnosis not present

## 2018-03-07 LAB — BASIC METABOLIC PANEL
BUN: 21 mg/dL (ref 6–23)
CHLORIDE: 107 meq/L (ref 96–112)
CO2: 26 mEq/L (ref 19–32)
Calcium: 8.5 mg/dL (ref 8.4–10.5)
Creatinine, Ser: 1.12 mg/dL (ref 0.40–1.20)
GFR: 51.56 mL/min — ABNORMAL LOW (ref >60.00)
GLUCOSE: 150 mg/dL — AB (ref 70–99)
POTASSIUM: 3.7 meq/L (ref 3.5–5.1)
SODIUM: 142 meq/L (ref 135–145)

## 2018-03-08 ENCOUNTER — Ambulatory Visit (INDEPENDENT_AMBULATORY_CARE_PROVIDER_SITE_OTHER): Payer: PPO

## 2018-03-08 ENCOUNTER — Encounter: Payer: PPO | Admitting: Internal Medicine

## 2018-03-08 ENCOUNTER — Telehealth: Payer: Self-pay

## 2018-03-08 DIAGNOSIS — E538 Deficiency of other specified B group vitamins: Secondary | ICD-10-CM | POA: Diagnosis not present

## 2018-03-08 MED ORDER — CYANOCOBALAMIN 1000 MCG/ML IJ SOLN
1000.0000 ug | Freq: Once | INTRAMUSCULAR | Status: AC
Start: 2018-03-08 — End: 2018-03-08
  Administered 2018-03-08: 1000 ug via INTRAMUSCULAR

## 2018-03-08 NOTE — Progress Notes (Signed)
Pt came into for monthly B12 injection. Given in Right Deltoid. Tolerated well.

## 2018-03-08 NOTE — Telephone Encounter (Signed)
Called and spoke to pt and scheduled nurse visit for today

## 2018-03-08 NOTE — Telephone Encounter (Signed)
Copied from [place] [id number]. Topic: Quick Communication - See Telephone Encounter >> Mar 08, 2018  8:09 AM Hewitt Shorts wrote: Pt states that she was talking with Caryl Pina and has more questions for her   Best number [phone]

## 2018-03-09 ENCOUNTER — Encounter: Payer: Self-pay | Admitting: Internal Medicine

## 2018-03-09 ENCOUNTER — Telehealth: Payer: Self-pay | Admitting: Internal Medicine

## 2018-03-09 NOTE — Telephone Encounter (Signed)
3 attempts to schedule fu appt from recall list.   Deleting recall.  °Mailed Letter  °

## 2018-03-10 ENCOUNTER — Other Ambulatory Visit: Payer: Self-pay | Admitting: Internal Medicine

## 2018-03-14 ENCOUNTER — Other Ambulatory Visit: Payer: Self-pay | Admitting: Internal Medicine

## 2018-03-14 ENCOUNTER — Ambulatory Visit (INDEPENDENT_AMBULATORY_CARE_PROVIDER_SITE_OTHER): Payer: PPO | Admitting: Internal Medicine

## 2018-03-14 VITALS — BP 118/76 | HR 75 | Temp 98.9°F | Resp 18 | Ht 64.0 in | Wt 180.8 lb

## 2018-03-14 DIAGNOSIS — M810 Age-related osteoporosis without current pathological fracture: Secondary | ICD-10-CM

## 2018-03-14 DIAGNOSIS — Z860100 Personal history of colon polyps, unspecified: Secondary | ICD-10-CM

## 2018-03-14 DIAGNOSIS — E7800 Pure hypercholesterolemia, unspecified: Secondary | ICD-10-CM | POA: Diagnosis not present

## 2018-03-14 DIAGNOSIS — Z0000 Encounter for general adult medical examination without abnormal findings: Secondary | ICD-10-CM | POA: Diagnosis not present

## 2018-03-14 DIAGNOSIS — K219 Gastro-esophageal reflux disease without esophagitis: Secondary | ICD-10-CM | POA: Diagnosis not present

## 2018-03-14 DIAGNOSIS — E119 Type 2 diabetes mellitus without complications: Secondary | ICD-10-CM | POA: Diagnosis not present

## 2018-03-14 DIAGNOSIS — R945 Abnormal results of liver function studies: Secondary | ICD-10-CM | POA: Diagnosis not present

## 2018-03-14 DIAGNOSIS — L4050 Arthropathic psoriasis, unspecified: Secondary | ICD-10-CM | POA: Diagnosis not present

## 2018-03-14 DIAGNOSIS — K529 Noninfective gastroenteritis and colitis, unspecified: Secondary | ICD-10-CM

## 2018-03-14 DIAGNOSIS — D518 Other vitamin B12 deficiency anemias: Secondary | ICD-10-CM

## 2018-03-14 DIAGNOSIS — R7989 Other specified abnormal findings of blood chemistry: Secondary | ICD-10-CM

## 2018-03-14 MED ORDER — POTASSIUM CHLORIDE ER 10 MEQ PO TBCR: 10.0000 meq | EXTENDED_RELEASE_TABLET | Freq: Two times a day (BID) | ORAL | 1 refills | Status: DC

## 2018-03-14 NOTE — Assessment & Plan Note (Signed)
Physical today 03/14/18.  S/p hysterectomy.  Mammogram 01/17/18 - birads I.

## 2018-03-14 NOTE — Progress Notes (Signed)
Patient ID: Ashley Gross, female   DOB: October 15, 1951, 67 y.o.   MRN: [id number]   Subjective:    Patient ID: Ashley Gross, female    DOB: August 04, 1951, 67 y.o.   MRN: [id number]  HPI  Patient with past history of diabetes, asthma, anemia and hypertension.  She comes in today to follow up on these issues as well as for a complete physical exam.  She reports she is doing relatively well.  Some increased stress with her husband's and her granddaughter's health issues.  Overall she feels she is doing relatively well.  No chest pain.  Breathing overall stable.  Some allergy issues.  Generic benadryl helps.  No acid reflux.  No abdominal pain.  No vomiting.  Persistent bowel issue. Discussed recent labs.  Blood sugars averaging - 120-130s.    Past Medical History:  Diagnosis Date  . Anemia, unspecified   . Asthma   . Colitis   . Diabetes mellitus ([place])   . Esophagitis   . H/O hiatal hernia   . Hx of arteriovenous malformation (AVM)   . Hypertension   . IBS (irritable bowel syndrome)   . Nephrolithiasis   . Pernicious anemia   . Psoriatic arthritis ([facility])    s/p penicillamine, plaquenil, MTX, sulfasalazine.  s/p Embrel, Humira,.  Iritis.    . Pure hypercholesterolemia    Past Surgical History:  Procedure Laterality Date  . ABDOMINAL HYSTERECTOMY  1981   ovaries left in place  . ANKLE SURGERY     right  . BUNIONECTOMY    . CHOLECYSTECTOMY  1989  . COLONOSCOPY WITH PROPOFOL N/A 12/27/2017   Procedure: COLONOSCOPY WITH PROPOFOL;  Surgeon: Lin Landsman, MD;  Location: [facility] ENDOSCOPY;  Service: Gastroenterology;  Laterality: N/A;  . ESOPHAGOGASTRODUODENOSCOPY (EGD) WITH PROPOFOL N/A 12/27/2017   Procedure: ESOPHAGOGASTRODUODENOSCOPY (EGD) WITH PROPOFOL;  Surgeon: Lin Landsman, MD;  Location: [facility] ENDOSCOPY;  Service: Gastroenterology;  Laterality: N/A;  . HAND SURGERY     right  . HEEL SPUR SURGERY    . NOSE SURGERY     x2  . UMBILICAL HERNIA REPAIR     Family History    Problem Relation Age of Onset  . Diabetes Other   . Hypertension Other   . Breast cancer Neg Hx   . Colon cancer Neg Hx    Social History   Socioeconomic History  . Marital status: Married    Spouse name: Not on file  . Number of children: 2  . Years of education: Not on file  . Highest education level: Not on file  Occupational History  . Not on file  Social Needs  . Financial resource strain: Not on file  . Food insecurity:    Worry: Not on file    Inability: Not on file  . Transportation needs:    Medical: Not on file    Non-medical: Not on file  Tobacco Use  . Smoking status: Never Smoker  . Smokeless tobacco: Never Used  Substance and Sexual Activity  . Alcohol use: No    Alcohol/week: 0.0 oz  . Drug use: No  . Sexual activity: Not Currently  Lifestyle  . Physical activity:    Days per week: Not on file    Minutes per session: Not on file  . Stress: Not on file  Relationships  . Social connections:    Talks on phone: Not on file    Gets together: Not on file    Attends religious service: Not  on file    Active member of club or organization: Not on file    Attends meetings of clubs or organizations: Not on file    Relationship status: Not on file  Other Topics Concern  . Not on file  Social History Narrative   She is married and has two children    Outpatient Encounter Medications as of 03/14/2018  Medication Sig  . albuterol (ACCUNEB) 0.63 MG/3ML nebulizer solution Take 3 mLs (0.63 mg total) by nebulization every 6 (six) hours as needed for wheezing.  Marland Kitchen amLODipine (NORVASC) 5 MG tablet TAKE 1 TABLET BY MOUTH TWICE DAILY  . Calcium Carbonate-Vit D-Min (CALCIUM 600+D PLUS MINERALS) 600-400 MG-UNIT TABS Take by mouth.  . Cholecalciferol (VITAMIN D-3) 1000 UNITS CAPS Take 1 capsule by mouth daily.  . citalopram (CELEXA) 10 MG tablet TAKE 1 TABLET BY MOUTH ONCE DAILY  . dicyclomine (BENTYL) 20 MG tablet Take 1 tablet (20 mg total) by mouth every 6 (six)  hours.  . diphenhydrAMINE (BENADRYL) 25 mg capsule Take by mouth.  . DOCOSAHEXAENOIC ACID PO Take by mouth.  . Fluticasone-Salmeterol (ADVAIR DISKUS) 250-50 MCG/DOSE AEPB Inhale 1 puff into the lungs 2 (two) times daily.  Marland Kitchen ipratropium (ATROVENT HFA) 17 MCG/ACT inhaler Inhale 2 puffs into the lungs every 6 (six) hours.  Marland Kitchen loperamide (IMODIUM) 2 MG capsule Take by mouth.  Marland Kitchen omeprazole (PRILOSEC) 20 MG capsule TAKE 1 CAPSULE BY MOUTH ONCE DAILY  . ONE TOUCH ULTRA TEST test strip PATIENT NEEDS NEW METER STRIPS AND LANCETS FOR ONE TOUCH. HER INSURANCE NO LONGER COVERS ACCUCHEK.  . pioglitazone (ACTOS) 15 MG tablet TAKE 1 TABLET BY MOUTH ONCE DAILY  . potassium chloride (K-DUR) 10 MEQ tablet Take 1 tablet (10 mEq total) by mouth 2 (two) times daily.  . rosuvastatin (CRESTOR) 10 MG tablet Take 1 tablet (10 mg total) by mouth daily.  . rosuvastatin (CRESTOR) 10 MG tablet TAKE 1 TABLET(10 MG) BY MOUTH DAILY  . triamterene-hydrochlorothiazide (MAXZIDE-25) 37.5-25 MG tablet Take 0.5 tablets by mouth daily.  . [DISCONTINUED] glipiZIDE (GLUCOTROL XL) 5 MG 24 hr tablet TAKE 1 TABLET BY MOUTH EVERY DAY  . [DISCONTINUED] HYDROcodone-homatropine (HYCODAN) 5-1.5 MG/5ML syrup Take 5 mLs by mouth every 8 (eight) hours as needed for cough.  . [DISCONTINUED] lisinopril (PRINIVIL,ZESTRIL) 40 MG tablet TAKE 1 TABLET BY MOUTH EVERY DAY  . [DISCONTINUED] metFORMIN (GLUCOPHAGE) 1000 MG tablet TAKE 1 TABLET BY MOUTH TWICE A DAY WITH MEALS  . [DISCONTINUED] potassium chloride (K-DUR) 10 MEQ tablet Take 1 tablet (10 mEq total) by mouth 2 (two) times daily.  . [DISCONTINUED] PROCTOZONE-HC 2.5 % rectal cream    No facility-administered encounter medications on file as of 03/14/2018.     Review of Systems  Constitutional: Negative for appetite change and unexpected weight change.  HENT: Negative for sinus pressure.        Some allergy issues.    Eyes: Negative for pain and visual disturbance.  Respiratory: Negative for  cough, chest tightness and shortness of breath.   Cardiovascular: Negative for chest pain, palpitations and leg swelling.  Gastrointestinal: Negative for abdominal pain, nausea and vomiting.       Persistent bowel issues.    Genitourinary: Negative for difficulty urinating and dysuria.  Musculoskeletal: Negative for joint swelling and myalgias.  Skin: Negative for color change and rash.  Neurological: Negative for dizziness, light-headedness and headaches.  Hematological: Negative for adenopathy. Does not bruise/bleed easily.  Psychiatric/Behavioral: Negative for agitation and dysphoric mood.  Objective:    Physical Exam  Constitutional: She is oriented to person, place, and time. She appears well-developed and well-nourished. No distress.  HENT:  Nose: Nose normal.  Mouth/Throat: Oropharynx is clear and moist.  Eyes: Right eye exhibits no discharge. Left eye exhibits no discharge. No scleral icterus.  Neck: Neck supple. No thyromegaly present.  Cardiovascular: Normal rate and regular rhythm.  Pulmonary/Chest: Breath sounds normal. No accessory muscle usage. No tachypnea. No respiratory distress. She has no decreased breath sounds. She has no wheezes. She has no rhonchi. Right breast exhibits no inverted nipple, no mass, no nipple discharge and no tenderness (no axillary adenopathy). Left breast exhibits no inverted nipple, no mass, no nipple discharge and no tenderness (no axilarry adenopathy).  Abdominal: Soft. Bowel sounds are normal. There is no tenderness.  Musculoskeletal: She exhibits no edema or tenderness.  Lymphadenopathy:    She has no cervical adenopathy.  Neurological: She is alert and oriented to person, place, and time.  Skin: No rash noted. No erythema.  Psychiatric: She has a normal mood and affect. Her behavior is normal.    BP 118/76 (BP Location: Left Arm, Patient Position: Sitting, Cuff Size: Large)   Pulse 75   Temp 98.9 F (37.2 C) (Oral)   Resp 18    Ht '5\' 4"'  (1.626 m)   Wt 180 lb 12.8 oz (82 kg)   SpO2 98%   BMI 31.03 kg/m  Wt Readings from Last 3 Encounters:  03/14/18 180 lb 12.8 oz (82 kg)  01/11/18 177 lb 12.8 oz (80.6 kg)  12/29/17 175 lb 6.4 oz (79.6 kg)     Lab Results  Component Value Date   WBC 6.9 12/06/2017   HGB 13.2 12/06/2017   HCT 40.2 12/06/2017   PLT 245.0 12/06/2017   GLUCOSE 150 (H) 03/07/2018   CHOL 233 (H) 12/06/2017   TRIG 327.0 (H) 12/06/2017   HDL 60.10 12/06/2017   LDLDIRECT 120.0 12/06/2017   LDLCALC 124 (H) 07/08/2017   ALT 22 12/06/2017   AST 25 12/06/2017   NA 142 03/07/2018   K 3.7 03/07/2018   CL 107 03/07/2018   CREATININE 1.12 03/07/2018   BUN 21 03/07/2018   CO2 26 03/07/2018   TSH 2.68 07/08/2017   HGBA1C 8.3 (H) 12/06/2017   MICROALBUR 3.5 (H) 03/16/2017    Mm Digital Screening Bilateral  Result Date: 01/17/2018 CLINICAL DATA:  Screening. EXAM: DIGITAL SCREENING BILATERAL MAMMOGRAM WITH CAD COMPARISON:  Previous exam(s). ACR Breast Density Category b: There are scattered areas of fibroglandular density. FINDINGS: There are no findings suspicious for malignancy. Images were processed with CAD. IMPRESSION: No mammographic evidence of malignancy. A result letter of this screening mammogram will be mailed directly to the patient. RECOMMENDATION: Screening mammogram in one year. (Code:SM-B-01Y) BI-RADS CATEGORY  1: Negative. Electronically Signed   By: Nolon Nations M.D.   On: 01/17/2018 09:52       Assessment & Plan:   Problem List Items Addressed This Visit    Abnormal liver function test    Follow liver function tests.        Anemia    Follow cbc.        Chronic diarrhea    Being followed by GI.  Still with persistent loose stool.        Diabetes mellitus ([place])    Low carb diet and exercise.  Follow met b and a1c.  Last a1c increased as outlined.  Reports sugars better - as outlined.  Follow.  Schedule for f/u labs.   Lab Results  Component Value Date   HGBA1C 8.3  (H) 12/06/2017        Relevant Orders   Hemoglobin H8I   Basic metabolic panel   Microalbumin / creatinine urine ratio   GERD (gastroesophageal reflux disease)    Upper symptoms controlled.        Health care maintenance    Physical today 03/14/18.  S/p hysterectomy.  Mammogram 01/17/18 - birads I.       Hypercholesterolemia    On crestor.  Low cholesterol diet and exercise.  Follow lipid panel and liver function tests.        Relevant Orders   Hepatic function panel   Lipid panel   TSH   Osteoporosis    Reclast.  Check vitamin D level.       Relevant Orders   VITAMIN D 25 Hydroxy (Vit-D Deficiency, Fractures)   Personal history of colonic polyps    Colonoscopy 12/2017.  Polyps.  Seeing GI.        Psoriatic arthritis ([place])    Followed by Dr Jefm Bryant. Stable.        Other Visit Diagnoses    Routine general medical examination at a health care facility    -  Primary       Einar Pheasant, MD

## 2018-03-15 ENCOUNTER — Telehealth: Payer: Self-pay | Admitting: Internal Medicine

## 2018-03-15 ENCOUNTER — Other Ambulatory Visit: Payer: Self-pay | Admitting: *Deleted

## 2018-03-15 MED ORDER — LISINOPRIL 40 MG PO TABS: 40.0000 mg | ORAL_TABLET | Freq: Every day | ORAL | 0 refills | Status: DC

## 2018-03-15 MED ORDER — GLIPIZIDE ER 5 MG PO TB24: 5.0000 mg | ORAL_TABLET | Freq: Every day | ORAL | 0 refills | Status: DC

## 2018-03-15 NOTE — Telephone Encounter (Signed)
Rx refilled per protocol. Patient in office 03/14/18.

## 2018-03-15 NOTE — Telephone Encounter (Signed)
Copied from [place] [id number]. Topic: Quick Communication - Rx Refill/Question >> Mar 15, 2018 11:17 AM Ahmed Prima L wrote: Medication: glipiZIDE (GLUCOTROL XL) 5 MG 24 hr tablet lisinopril (PRINIVIL,ZESTRIL) 40 MG tablet 90 days on both Has the patient contacted their pharmacy? yes (Agent: If no, request that the patient contact the pharmacy for the refill.) Preferred Pharmacy (with phone number or street name): [facility] [phone] - [place], [place] AT [facility] [address] [place] Agent: Please be advised that RX refills may take up to 3 business days. We ask that you follow-up with your pharmacy.

## 2018-03-21 ENCOUNTER — Encounter: Payer: Self-pay | Admitting: Internal Medicine

## 2018-03-21 ENCOUNTER — Other Ambulatory Visit: Payer: Self-pay | Admitting: Internal Medicine

## 2018-03-21 NOTE — Assessment & Plan Note (Signed)
Reclast.  Check vitamin D level.

## 2018-03-21 NOTE — Assessment & Plan Note (Signed)
Follow liver function tests.   

## 2018-03-21 NOTE — Assessment & Plan Note (Signed)
Blood pressure under good control.  Continue same medication regimen.  Follow pressures.  Follow metabolic panel.   

## 2018-03-21 NOTE — Assessment & Plan Note (Signed)
Being followed by GI.  Still with persistent loose stool.

## 2018-03-21 NOTE — Assessment & Plan Note (Signed)
Followed by Dr Kernodle.  Stable.  

## 2018-03-21 NOTE — Assessment & Plan Note (Signed)
Follow cbc.  

## 2018-03-21 NOTE — Assessment & Plan Note (Signed)
Colonoscopy 12/2017.  Polyps.  Seeing GI.   

## 2018-03-21 NOTE — Assessment & Plan Note (Addendum)
Low carb diet and exercise.  Follow met b and a1c.  Last a1c increased as outlined.  Reports sugars better - as outlined.  Follow.  Schedule for f/u labs.   Lab Results  Component Value Date   HGBA1C 8.3 (H) 12/06/2017

## 2018-03-21 NOTE — Assessment & Plan Note (Signed)
Upper symptoms controlled.  

## 2018-03-21 NOTE — Assessment & Plan Note (Signed)
On crestor.  Low cholesterol diet and exercise.  Follow lipid panel and liver function tests.   

## 2018-04-12 ENCOUNTER — Ambulatory Visit (INDEPENDENT_AMBULATORY_CARE_PROVIDER_SITE_OTHER): Payer: PPO | Admitting: *Deleted

## 2018-04-12 DIAGNOSIS — D518 Other vitamin B12 deficiency anemias: Secondary | ICD-10-CM

## 2018-04-12 MED ORDER — CYANOCOBALAMIN 1000 MCG/ML IJ SOLN
1000.0000 ug | Freq: Once | INTRAMUSCULAR | Status: AC
  Administered 2018-04-12: 1000 ug via INTRAMUSCULAR

## 2018-04-12 NOTE — Progress Notes (Signed)
Patient presented for B 12 injection to right deltoid, patient voiced no concerns nor showed any signs of distress during injection. 

## 2018-04-20 ENCOUNTER — Other Ambulatory Visit: Payer: Self-pay | Admitting: Internal Medicine

## 2018-04-26 DIAGNOSIS — H04203 Unspecified epiphora, bilateral lacrimal glands: Secondary | ICD-10-CM | POA: Diagnosis not present

## 2018-05-09 ENCOUNTER — Other Ambulatory Visit: Payer: Self-pay | Admitting: Internal Medicine

## 2018-05-09 DIAGNOSIS — Q6689 Other  specified congenital deformities of feet: Secondary | ICD-10-CM | POA: Diagnosis not present

## 2018-05-09 DIAGNOSIS — M779 Enthesopathy, unspecified: Secondary | ICD-10-CM | POA: Diagnosis not present

## 2018-05-10 ENCOUNTER — Other Ambulatory Visit: Payer: Self-pay | Admitting: Ophthalmology

## 2018-05-10 DIAGNOSIS — H04203 Unspecified epiphora, bilateral lacrimal glands: Secondary | ICD-10-CM | POA: Diagnosis not present

## 2018-05-17 ENCOUNTER — Ambulatory Visit
Admission: RE | Admit: 2018-05-17 | Discharge: 2018-05-17 | Disposition: A | Discharge status: Home / Self Care | Payer: PPO | Source: Ambulatory Visit | Attending: Ophthalmology | Admitting: Ophthalmology

## 2018-05-17 ENCOUNTER — Ambulatory Visit (INDEPENDENT_AMBULATORY_CARE_PROVIDER_SITE_OTHER): Payer: PPO | Admitting: *Deleted

## 2018-05-17 DIAGNOSIS — D518 Other vitamin B12 deficiency anemias: Secondary | ICD-10-CM

## 2018-05-17 DIAGNOSIS — H04203 Unspecified epiphora, bilateral lacrimal glands: Secondary | ICD-10-CM | POA: Insufficient documentation

## 2018-05-17 MED ORDER — SODIUM PERTECHNETATE TC 99M INJECTION
371.4000 | Freq: Once | INTRAVENOUS | Status: AC | PRN
  Administered 2018-05-17: 371.4 via INTRAVENOUS

## 2018-05-17 MED ORDER — CYANOCOBALAMIN 1000 MCG/ML IJ SOLN
1000.0000 ug | Freq: Once | INTRAMUSCULAR | Status: AC
  Administered 2018-05-17: 1000 ug via INTRAMUSCULAR

## 2018-05-17 NOTE — Progress Notes (Signed)
Patient presented for B 12 injection to left deltoid, patient voiced no concerns nor showed any signs of distress during injection. 

## 2018-05-18 ENCOUNTER — Ambulatory Visit: Payer: PPO

## 2018-06-09 ENCOUNTER — Other Ambulatory Visit: Payer: Self-pay | Admitting: Internal Medicine

## 2018-06-12 ENCOUNTER — Other Ambulatory Visit: Payer: Self-pay | Admitting: Internal Medicine

## 2018-06-14 ENCOUNTER — Ambulatory Visit (INDEPENDENT_AMBULATORY_CARE_PROVIDER_SITE_OTHER): Payer: PPO | Admitting: Internal Medicine

## 2018-06-14 ENCOUNTER — Ambulatory Visit (INDEPENDENT_AMBULATORY_CARE_PROVIDER_SITE_OTHER): Payer: PPO

## 2018-06-14 ENCOUNTER — Ambulatory Visit: Payer: PPO | Admitting: Internal Medicine

## 2018-06-14 ENCOUNTER — Encounter: Payer: Self-pay | Admitting: Internal Medicine

## 2018-06-14 VITALS — BP 136/82 | HR 77 | Temp 98.8°F | Ht 64.0 in | Wt 185.4 lb

## 2018-06-14 DIAGNOSIS — K625 Hemorrhage of anus and rectum: Secondary | ICD-10-CM

## 2018-06-14 DIAGNOSIS — I10 Essential (primary) hypertension: Secondary | ICD-10-CM | POA: Diagnosis not present

## 2018-06-14 DIAGNOSIS — M25519 Pain in unspecified shoulder: Secondary | ICD-10-CM | POA: Diagnosis not present

## 2018-06-14 DIAGNOSIS — L4050 Arthropathic psoriasis, unspecified: Secondary | ICD-10-CM

## 2018-06-14 DIAGNOSIS — D518 Other vitamin B12 deficiency anemias: Secondary | ICD-10-CM

## 2018-06-14 DIAGNOSIS — E119 Type 2 diabetes mellitus without complications: Secondary | ICD-10-CM

## 2018-06-14 DIAGNOSIS — R05 Cough: Secondary | ICD-10-CM

## 2018-06-14 DIAGNOSIS — M19012 Primary osteoarthritis, left shoulder: Secondary | ICD-10-CM | POA: Diagnosis not present

## 2018-06-14 DIAGNOSIS — K219 Gastro-esophageal reflux disease without esophagitis: Secondary | ICD-10-CM | POA: Diagnosis not present

## 2018-06-14 DIAGNOSIS — E7800 Pure hypercholesterolemia, unspecified: Secondary | ICD-10-CM | POA: Diagnosis not present

## 2018-06-14 DIAGNOSIS — R600 Localized edema: Secondary | ICD-10-CM | POA: Diagnosis not present

## 2018-06-14 DIAGNOSIS — R945 Abnormal results of liver function studies: Secondary | ICD-10-CM | POA: Diagnosis not present

## 2018-06-14 DIAGNOSIS — R7989 Other specified abnormal findings of blood chemistry: Secondary | ICD-10-CM

## 2018-06-14 DIAGNOSIS — M25512 Pain in left shoulder: Secondary | ICD-10-CM | POA: Diagnosis not present

## 2018-06-14 DIAGNOSIS — R059 Cough, unspecified: Secondary | ICD-10-CM

## 2018-06-14 MED ORDER — LOSARTAN POTASSIUM 100 MG PO TABS: 100.0000 mg | ORAL_TABLET | Freq: Every day | ORAL | 2 refills | Status: DC

## 2018-06-14 NOTE — Progress Notes (Signed)
Patient ID: Ashley Gross, female   DOB: Dec 08, 1950, 67 y.o.   MRN: [id number]   Subjective:    Patient ID: Ashley Gross, female    DOB: 02-27-1951, 67 y.o.   MRN: [id number]  HPI  Patient here for a scheduled follow up.  Has been having problems with her tear ducts being blocked.  Has appt for evaluation 09/01/18.  Reports occasional sinus drainage, but nothing significant.  Some cough.  Persistent cough.  No chest congestion.  No sob.  No fever.  Taking cough drops.  No chest pain.  No acid reflux.  No abdominal pain.  Bowels moving.  States sugars mostly averaging 120s.  Also reports left shoulder pain after lifting microwave last week.  Has had problems with lifting her arm.     Past Medical History:  Diagnosis Date  . Anemia, unspecified   . Asthma   . Colitis   . Diabetes mellitus ([place])   . Esophagitis   . H/O hiatal hernia   . Hx of arteriovenous malformation (AVM)   . Hypertension   . IBS (irritable bowel syndrome)   . Nephrolithiasis   . Pernicious anemia   . Psoriatic arthritis ([facility])    s/p penicillamine, plaquenil, MTX, sulfasalazine.  s/p Embrel, Humira,.  Iritis.    . Pure hypercholesterolemia    Past Surgical History:  Procedure Laterality Date  . ABDOMINAL HYSTERECTOMY  1981   ovaries left in place  . ANKLE SURGERY     right  . BUNIONECTOMY    . CHOLECYSTECTOMY  1989  . COLONOSCOPY WITH PROPOFOL N/A 12/27/2017   Procedure: COLONOSCOPY WITH PROPOFOL;  Surgeon: Lin Landsman, MD;  Location: [facility] ENDOSCOPY;  Service: Gastroenterology;  Laterality: N/A;  . ESOPHAGOGASTRODUODENOSCOPY (EGD) WITH PROPOFOL N/A 12/27/2017   Procedure: ESOPHAGOGASTRODUODENOSCOPY (EGD) WITH PROPOFOL;  Surgeon: Lin Landsman, MD;  Location: [facility] ENDOSCOPY;  Service: Gastroenterology;  Laterality: N/A;  . HAND SURGERY     right  . HEEL SPUR SURGERY    . NOSE SURGERY     x2  . UMBILICAL HERNIA REPAIR     Family History  Problem Relation Age of Onset  . Diabetes Other     . Hypertension Other   . Breast cancer Neg Hx   . Colon cancer Neg Hx    Social History   Socioeconomic History  . Marital status: Married    Spouse name: Not on file  . Number of children: 2  . Years of education: Not on file  . Highest education level: Not on file  Occupational History  . Not on file  Social Needs  . Financial resource strain: Not on file  . Food insecurity:    Worry: Not on file    Inability: Not on file  . Transportation needs:    Medical: Not on file    Non-medical: Not on file  Tobacco Use  . Smoking status: Never Smoker  . Smokeless tobacco: Never Used  Substance and Sexual Activity  . Alcohol use: No    Alcohol/week: 0.0 standard drinks  . Drug use: No  . Sexual activity: Not Currently  Lifestyle  . Physical activity:    Days per week: Not on file    Minutes per session: Not on file  . Stress: Not on file  Relationships  . Social connections:    Talks on phone: Not on file    Gets together: Not on file    Attends religious service: Not on file  Active member of club or organization: Not on file    Attends meetings of clubs or organizations: Not on file    Relationship status: Not on file  Other Topics Concern  . Not on file  Social History Narrative   She is married and has two children    Outpatient Encounter Medications as of 06/14/2018  Medication Sig  . albuterol (ACCUNEB) 0.63 MG/3ML nebulizer solution Take 3 mLs (0.63 mg total) by nebulization every 6 (six) hours as needed for wheezing.  Marland Kitchen amLODipine (NORVASC) 5 MG tablet TAKE 1 TABLET BY MOUTH TWICE DAILY  . Calcium Carbonate-Vit D-Min (CALCIUM 600+D PLUS MINERALS) 600-400 MG-UNIT TABS Take by mouth.  . Cholecalciferol (VITAMIN D-3) 1000 UNITS CAPS Take 1 capsule by mouth daily.  . citalopram (CELEXA) 10 MG tablet TAKE 1 TABLET BY MOUTH ONCE DAILY  . diphenhydrAMINE (BENADRYL) 25 mg capsule Take by mouth.  . DOCOSAHEXAENOIC ACID PO Take by mouth.  . Fluticasone-Salmeterol  (ADVAIR DISKUS) 250-50 MCG/DOSE AEPB Inhale 1 puff into the lungs 2 (two) times daily.  Marland Kitchen glipiZIDE (GLUCOTROL XL) 5 MG 24 hr tablet Take 1 tablet (5 mg total) by mouth daily.  Marland Kitchen glipiZIDE (GLUCOTROL XL) 5 MG 24 hr tablet TAKE 1 TABLET BY MOUTH EVERY DAY  . ipratropium (ATROVENT HFA) 17 MCG/ACT inhaler Inhale 2 puffs into the lungs every 6 (six) hours.  Marland Kitchen loperamide (IMODIUM) 2 MG capsule Take by mouth.  Marland Kitchen omeprazole (PRILOSEC) 20 MG capsule TAKE 1 CAPSULE BY MOUTH ONCE DAILY  . ONE TOUCH ULTRA TEST test strip PATIENT NEEDS NEW METER STRIPS AND LANCETS FOR ONE TOUCH. HER INSURANCE NO LONGER COVERS ACCUCHEK.  . pioglitazone (ACTOS) 15 MG tablet TAKE 1 TABLET BY MOUTH ONCE DAILY  . potassium chloride (K-DUR) 10 MEQ tablet TAKE 1 TABLET(10 MEQ) BY MOUTH TWICE DAILY  . rosuvastatin (CRESTOR) 10 MG tablet Take 1 tablet (10 mg total) by mouth daily.  . rosuvastatin (CRESTOR) 10 MG tablet TAKE 1 TABLET(10 MG) BY MOUTH DAILY  . triamterene-hydrochlorothiazide (MAXZIDE-25) 37.5-25 MG tablet Take 0.5 tablets by mouth daily.  . [DISCONTINUED] lisinopril (PRINIVIL,ZESTRIL) 40 MG tablet TAKE 1 TABLET BY MOUTH DAILY  . dicyclomine (BENTYL) 20 MG tablet Take 1 tablet (20 mg total) by mouth every 6 (six) hours.  Marland Kitchen losartan (COZAAR) 100 MG tablet Take 1 tablet (100 mg total) by mouth daily.   No facility-administered encounter medications on file as of 06/14/2018.     Review of Systems  Constitutional: Negative for appetite change and unexpected weight change.  HENT: Negative for congestion and sinus pressure.   Respiratory: Positive for cough. Negative for chest tightness and shortness of breath.   Cardiovascular: Negative for chest pain, palpitations and leg swelling.  Gastrointestinal: Negative for abdominal pain, nausea and vomiting.  Genitourinary: Negative for difficulty urinating and dysuria.  Musculoskeletal: Negative for joint swelling and myalgias.       Left shoulder pain as outlined.    Skin:  Negative for color change and rash.  Neurological: Negative for dizziness, light-headedness and headaches.  Psychiatric/Behavioral: Negative for agitation and dysphoric mood.       Objective:    Physical Exam  Constitutional: She appears well-developed and well-nourished. No distress.  HENT:  Nose: Nose normal.  Mouth/Throat: Oropharynx is clear and moist.  Neck: Neck supple. No thyromegaly present.  Cardiovascular: Normal rate and regular rhythm.  Pulmonary/Chest: Breath sounds normal. No respiratory distress. She has no wheezes.  Abdominal: Soft. Bowel sounds are normal. There is no tenderness.  Musculoskeletal: She exhibits no edema or tenderness.  Increased pain shoulder - increased with abducting her arm at or above 90 degrees.  Increased pain with reaching over and posteriorly.    Lymphadenopathy:    She has no cervical adenopathy.  Skin: No rash noted. No erythema.  Psychiatric: She has a normal mood and affect. Her behavior is normal.    BP 136/82   Pulse 77   Temp 98.8 F (37.1 C) (Oral)   Ht _0  (1.626 m)   Wt 185 lb 6.4 oz (84.1 kg)   SpO2 98%   BMI 31.82 kg/m  Wt Readings from Last 3 Encounters:  06/14/18 185 lb 6.4 oz (84.1 kg)  03/14/18 180 lb 12.8 oz (82 kg)  01/11/18 177 lb 12.8 oz (80.6 kg)     Lab Results  Component Value Date   WBC 6.9 12/06/2017   HGB 13.2 12/06/2017   HCT 40.2 12/06/2017   PLT 245.0 12/06/2017   GLUCOSE 150 (H) 03/07/2018   CHOL 233 (H) 12/06/2017   TRIG 327.0 (H) 12/06/2017   HDL 60.10 12/06/2017   LDLDIRECT 120.0 12/06/2017   LDLCALC 124 (H) 07/08/2017   ALT 22 12/06/2017   AST 25 12/06/2017   NA 142 03/07/2018   K 3.7 03/07/2018   CL 107 03/07/2018   CREATININE 1.12 03/07/2018   BUN 21 03/07/2018   CO2 26 03/07/2018   TSH 2.68 07/08/2017   HGBA1C 8.3 (H) 12/06/2017   MICROALBUR 3.5 (H) 03/16/2017    Nm Dacryocystogram  Result Date: 05/17/2018 CLINICAL DATA:  BILATERAL epiphora for 4-5 months, finished a 10  day course of antibiotics 1 week ago, no previous eye surgery EXAM: NUCLEAR MEDICINE DACROCYSTOGRAM TECHNIQUE: Three drops of tracer replaced in each eye. Imaging was performed for 15 minutes. RADIOPHARMACEUTICALS:  371.4 microcuries Tc-47mpertechnetate O.U. COMPARISON:  None. FINDINGS: Tracer moved to medial canthus of both eyes. On the RIGHT, a small amount of tracer is seen at the proximal portion of the nasolacrimal duct but does not pass caudally into the nasal passage. On the LEFT, no tracer is seen entering the nasolacrimal duct or nasal passage. 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 Electronically Signed   By: MLavonia DanaM.D.   On: 05/17/2018 13:35       Assessment & Plan:   Problem List Items Addressed This Visit    Abnormal liver function test    Follow liver function tests.        Anemia    Follow cbc.       Bilateral lower extremity edema    Improved.        Cough    Persistent cough.  Dry.  No chest congestion.  Does not feel bad.  Will stop ace inhibitor.  Start losartan.  Lungs clear.  Follow.        Diabetes mellitus ([place]    Low carb diet and exercise.  Sugars as outlined.  Follow met b and a1c.         Relevant Medications   losartan (COZAAR) 100 MG tablet   GERD (gastroesophageal reflux disease)    Upper symptoms controlled.        Hypercholesterolemia    On crestor.  Low cholesterol diet and exercise.  Follow lipid panel and liver function tests.        Relevant Medications   losartan (COZAAR) 100 MG  tablet   Hypertension    Blood pressure under good control.  With the cough, need to confirm not related to the ace inhibitor.  Will stop lisinopril.  start losartan 126m q day.  Follow pressures.  Follow metabolic panel.        Relevant Medications   losartan (COZAAR) 100 MG tablet    Psoriatic arthritis ([place]    Followed by Dr KJefm Bryant  Stable.        Rectal bleeding    Persistent intermittent bleeding.  Seeing GI.        Shoulder pain - Primary    Persistent shoulder pain s/p injury.  Limited rom. Will xray.  May need ortho referral.        Relevant Orders   DG Shoulder Left (Completed)       CEinar Pheasant MD

## 2018-06-14 NOTE — Patient Instructions (Signed)
Stop lisinopril.  Start losartan 100mg  per day

## 2018-06-15 ENCOUNTER — Encounter: Payer: Self-pay | Admitting: *Deleted

## 2018-06-15 ENCOUNTER — Other Ambulatory Visit: Payer: Self-pay | Admitting: Internal Medicine

## 2018-06-15 DIAGNOSIS — M25512 Pain in left shoulder: Secondary | ICD-10-CM

## 2018-06-15 NOTE — Progress Notes (Signed)
Order placed for ortho referral.   

## 2018-06-19 ENCOUNTER — Other Ambulatory Visit: Payer: Self-pay | Admitting: Internal Medicine

## 2018-06-19 ENCOUNTER — Encounter: Payer: Self-pay | Admitting: Internal Medicine

## 2018-06-19 NOTE — Assessment & Plan Note (Signed)
On crestor.  Low cholesterol diet and exercise.  Follow lipid panel and liver function tests.   

## 2018-06-19 NOTE — Assessment & Plan Note (Signed)
Follow cbc.  

## 2018-06-19 NOTE — Assessment & Plan Note (Signed)
Persistent shoulder pain s/p injury.  Limited rom. Will xray.  May need ortho referral.

## 2018-06-19 NOTE — Assessment & Plan Note (Addendum)
Blood pressure under good control.  With the cough, need to confirm not related to the ace inhibitor.  Will stop lisinopril.  start losartan 100mg  q day.  Follow pressures.  Follow metabolic panel.

## 2018-06-19 NOTE — Assessment & Plan Note (Addendum)
Persistent cough.  Dry.  No chest congestion.  Does not feel bad.  Will stop ace inhibitor.  Start losartan.  Lungs clear.  Follow.

## 2018-06-19 NOTE — Assessment & Plan Note (Signed)
Persistent intermittent bleeding.  Seeing GI.

## 2018-06-19 NOTE — Assessment & Plan Note (Addendum)
Low carb diet and exercise.  Sugars as outlined.  Follow met b and a1c.   

## 2018-06-19 NOTE — Assessment & Plan Note (Signed)
Upper symptoms controlled.  

## 2018-06-19 NOTE — Assessment & Plan Note (Signed)
Followed by Dr Kernodle.  Stable.  

## 2018-06-19 NOTE — Assessment & Plan Note (Signed)
Follow liver function tests.   

## 2018-06-19 NOTE — Assessment & Plan Note (Signed)
Improved

## 2018-06-21 ENCOUNTER — Other Ambulatory Visit (INDEPENDENT_AMBULATORY_CARE_PROVIDER_SITE_OTHER): Payer: PPO

## 2018-06-21 ENCOUNTER — Ambulatory Visit (INDEPENDENT_AMBULATORY_CARE_PROVIDER_SITE_OTHER): Payer: PPO

## 2018-06-21 DIAGNOSIS — D518 Other vitamin B12 deficiency anemias: Secondary | ICD-10-CM | POA: Diagnosis not present

## 2018-06-21 DIAGNOSIS — E119 Type 2 diabetes mellitus without complications: Secondary | ICD-10-CM

## 2018-06-21 DIAGNOSIS — E7800 Pure hypercholesterolemia, unspecified: Secondary | ICD-10-CM

## 2018-06-21 DIAGNOSIS — M810 Age-related osteoporosis without current pathological fracture: Secondary | ICD-10-CM | POA: Diagnosis not present

## 2018-06-21 LAB — BASIC METABOLIC PANEL
BUN: 28 mg/dL — AB (ref 6–23)
CHLORIDE: 104 meq/L (ref 96–112)
CO2: 28 meq/L (ref 19–32)
CREATININE: 1.15 mg/dL (ref 0.40–1.20)
Calcium: 9 mg/dL (ref 8.4–10.5)
GFR: 49.96 mL/min — ABNORMAL LOW (ref >60.00)
Glucose, Bld: 226 mg/dL — ABNORMAL HIGH (ref 70–99)
POTASSIUM: 3.6 meq/L (ref 3.5–5.1)
Sodium: 139 mEq/L (ref 135–145)

## 2018-06-21 LAB — TSH: TSH: 2.83 u[IU]/mL (ref 0.35–4.50)

## 2018-06-21 LAB — HEPATIC FUNCTION PANEL
ALBUMIN: 3.8 g/dL (ref 3.5–5.2)
ALT: 14 U/L (ref 0–35)
AST: 16 U/L (ref 0–37)
Alkaline Phosphatase: 66 U/L (ref 39–117)
Bilirubin, Direct: 0.1 mg/dL (ref 0.0–0.3)
TOTAL PROTEIN: 6.5 g/dL (ref 6.0–8.3)
Total Bilirubin: 0.4 mg/dL (ref 0.2–1.2)

## 2018-06-21 LAB — MICROALBUMIN / CREATININE URINE RATIO
Creatinine,U: 146.9 mg/dL
Microalb Creat Ratio: 0.6 mg/g (ref 0.0–30.0)
Microalb, Ur: 0.9 mg/dL (ref 0.0–1.9)

## 2018-06-21 LAB — LIPID PANEL
Cholesterol: 143 mg/dL (ref 0–200)
HDL: 55 mg/dL (ref >39.00)
LDL CALC: 53 mg/dL (ref 0–99)
NONHDL: 87.62
Total CHOL/HDL Ratio: 3
Triglycerides: 175 mg/dL — ABNORMAL HIGH (ref 0.0–149.0)
VLDL: 35 mg/dL (ref 0.0–40.0)

## 2018-06-21 LAB — VITAMIN D 25 HYDROXY (VIT D DEFICIENCY, FRACTURES): VITD: 8.96 ng/mL — ABNORMAL LOW (ref 30.00–100.00)

## 2018-06-21 LAB — HEMOGLOBIN A1C: Hgb A1c MFr Bld: 8.2 % — ABNORMAL HIGH (ref 4.6–6.5)

## 2018-06-21 MED ORDER — CYANOCOBALAMIN 1000 MCG/ML IJ SOLN
1000.0000 ug | Freq: Once | INTRAMUSCULAR | Status: AC
  Administered 2018-06-21: 1000 ug via INTRAMUSCULAR

## 2018-06-21 NOTE — Progress Notes (Signed)
Patient presented for B 12 injection to left deltoid, patient voiced no concerns nor showed any signs of distress during injection. 

## 2018-06-24 ENCOUNTER — Other Ambulatory Visit: Payer: Self-pay | Admitting: Internal Medicine

## 2018-06-24 ENCOUNTER — Other Ambulatory Visit: Payer: Self-pay

## 2018-06-24 DIAGNOSIS — R944 Abnormal results of kidney function studies: Secondary | ICD-10-CM

## 2018-06-24 MED ORDER — VITAMIN D (ERGOCALCIFEROL) 1.25 MG (50000 UNIT) PO CAPS: 50000.0000 [IU] | ORAL_CAPSULE | ORAL | 0 refills | Status: DC

## 2018-06-24 NOTE — Progress Notes (Signed)
Order placed for f/u labs.  

## 2018-06-26 ENCOUNTER — Encounter: Payer: Self-pay | Admitting: Gastroenterology

## 2018-06-29 NOTE — Telephone Encounter (Signed)
Please schedule her an appt to see Katie (pharmacy) here in the office.  See notes.

## 2018-06-30 DIAGNOSIS — M779 Enthesopathy, unspecified: Secondary | ICD-10-CM | POA: Diagnosis not present

## 2018-06-30 DIAGNOSIS — L600 Ingrowing nail: Secondary | ICD-10-CM | POA: Diagnosis not present

## 2018-06-30 DIAGNOSIS — Q6689 Other  specified congenital deformities of feet: Secondary | ICD-10-CM | POA: Diagnosis not present

## 2018-07-01 DIAGNOSIS — M7502 Adhesive capsulitis of left shoulder: Secondary | ICD-10-CM | POA: Diagnosis not present

## 2018-07-13 DIAGNOSIS — M6281 Muscle weakness (generalized): Secondary | ICD-10-CM | POA: Diagnosis not present

## 2018-07-13 DIAGNOSIS — M25512 Pain in left shoulder: Secondary | ICD-10-CM | POA: Diagnosis not present

## 2018-07-13 DIAGNOSIS — M7502 Adhesive capsulitis of left shoulder: Secondary | ICD-10-CM | POA: Diagnosis not present

## 2018-07-13 DIAGNOSIS — M25612 Stiffness of left shoulder, not elsewhere classified: Secondary | ICD-10-CM | POA: Diagnosis not present

## 2018-07-14 ENCOUNTER — Telehealth: Payer: Self-pay

## 2018-07-14 NOTE — Telephone Encounter (Signed)
OK to place orders for lab?

## 2018-07-14 NOTE — Telephone Encounter (Signed)
Patient says she needs her hemoglobin checked due to rectal bleeding. She has polyps that are bleeding. She is staying tired and wondered if her hemoglobin was low. Patient stated that Dr. Marius Ditch is aware that she has bleeding due to inflammed polyps.

## 2018-07-14 NOTE — Telephone Encounter (Signed)
Copied from [place] [id number]. Topic: General - Other >> Jul 14, 2018  8:59 AM Valla Leaver wrote: Reason for CRM: Patient neds lab orders for hemoglobin check. Please call once they are placed.

## 2018-07-14 NOTE — Telephone Encounter (Signed)
Need more information.  Per lab note, she was to come back for met b which is ordered.  She is mentioning a hemoglobin.  Need to know why hemoglobin needs to be checked.  Was she told needed to be checked?

## 2018-07-15 ENCOUNTER — Other Ambulatory Visit (INDEPENDENT_AMBULATORY_CARE_PROVIDER_SITE_OTHER): Payer: PPO

## 2018-07-15 ENCOUNTER — Other Ambulatory Visit: Payer: Self-pay | Admitting: Internal Medicine

## 2018-07-15 DIAGNOSIS — K625 Hemorrhage of anus and rectum: Secondary | ICD-10-CM

## 2018-07-15 DIAGNOSIS — R944 Abnormal results of kidney function studies: Secondary | ICD-10-CM | POA: Diagnosis not present

## 2018-07-15 LAB — CBC WITH DIFFERENTIAL/PLATELET
Basophils Absolute: 0.1 10*3/uL (ref 0.0–0.1)
Basophils Relative: 0.6 % (ref 0.0–3.0)
EOS ABS: 0.1 10*3/uL (ref 0.0–0.7)
Eosinophils Relative: 1.5 % (ref 0.0–5.0)
HEMATOCRIT: 37.6 % (ref 36.0–46.0)
HEMOGLOBIN: 12.2 g/dL (ref 12.0–15.0)
LYMPHS PCT: 23.7 % (ref 12.0–46.0)
Lymphs Abs: 2.2 10*3/uL (ref 0.7–4.0)
MCHC: 32.6 g/dL (ref 30.0–36.0)
MCV: 90.2 fl (ref 78.0–100.0)
Monocytes Absolute: 0.5 10*3/uL (ref 0.1–1.0)
Monocytes Relative: 5.4 % (ref 3.0–12.0)
NEUTROS ABS: 6.4 10*3/uL (ref 1.4–7.7)
Neutrophils Relative %: 68.8 % (ref 43.0–77.0)
PLATELETS: 250 10*3/uL (ref 150.0–400.0)
RBC: 4.17 Mil/uL (ref 3.87–5.11)
RDW: 13.5 % (ref 11.5–15.5)
WBC: 9.2 10*3/uL (ref 4.0–10.5)

## 2018-07-15 LAB — BASIC METABOLIC PANEL
BUN: 17 mg/dL (ref 6–23)
CALCIUM: 8.4 mg/dL (ref 8.4–10.5)
CO2: 25 mEq/L (ref 19–32)
Chloride: 106 mEq/L (ref 96–112)
Creatinine, Ser: 1.16 mg/dL (ref 0.40–1.20)
GFR: 49.46 mL/min — ABNORMAL LOW (ref >60.00)
Glucose, Bld: 202 mg/dL — ABNORMAL HIGH (ref 70–99)
Potassium: 3.3 mEq/L — ABNORMAL LOW (ref 3.5–5.1)
SODIUM: 141 meq/L (ref 135–145)

## 2018-07-15 LAB — FERRITIN: Ferritin: 17.4 ng/mL (ref 10.0–291.0)

## 2018-07-15 NOTE — Telephone Encounter (Signed)
Orders placed for labs.  Please schedule non fasting lab appt.

## 2018-07-15 NOTE — Addendum Note (Signed)
Addended by: Leeanne Rio on: 07/15/2018 02:11 PM   Modules accepted: Orders

## 2018-07-15 NOTE — Progress Notes (Signed)
Orders placed for labs

## 2018-07-15 NOTE — Telephone Encounter (Signed)
Pt was already scheduled

## 2018-07-18 ENCOUNTER — Other Ambulatory Visit: Payer: Self-pay | Admitting: Internal Medicine

## 2018-07-18 DIAGNOSIS — E876 Hypokalemia: Secondary | ICD-10-CM

## 2018-07-18 NOTE — Progress Notes (Signed)
Order placed for f/u potassium.  

## 2018-07-20 DIAGNOSIS — M6281 Muscle weakness (generalized): Secondary | ICD-10-CM | POA: Diagnosis not present

## 2018-07-20 DIAGNOSIS — M7502 Adhesive capsulitis of left shoulder: Secondary | ICD-10-CM | POA: Diagnosis not present

## 2018-07-20 DIAGNOSIS — M25512 Pain in left shoulder: Secondary | ICD-10-CM | POA: Diagnosis not present

## 2018-07-20 DIAGNOSIS — M25612 Stiffness of left shoulder, not elsewhere classified: Secondary | ICD-10-CM | POA: Diagnosis not present

## 2018-07-25 ENCOUNTER — Ambulatory Visit (INDEPENDENT_AMBULATORY_CARE_PROVIDER_SITE_OTHER): Payer: PPO | Admitting: Pharmacist

## 2018-07-25 ENCOUNTER — Encounter: Payer: Self-pay | Admitting: Pharmacist

## 2018-07-25 VITALS — BP 125/80 | HR 67 | Wt 182.8 lb

## 2018-07-25 DIAGNOSIS — E7800 Pure hypercholesterolemia, unspecified: Secondary | ICD-10-CM

## 2018-07-25 DIAGNOSIS — I10 Essential (primary) hypertension: Secondary | ICD-10-CM | POA: Diagnosis not present

## 2018-07-25 DIAGNOSIS — E1121 Type 2 diabetes mellitus with diabetic nephropathy: Secondary | ICD-10-CM

## 2018-07-25 MED ORDER — INSULIN PEN NEEDLE 32G X 4 MM MISC: 3 refills | Status: DC

## 2018-07-25 MED ORDER — INSULIN DEGLUDEC 100 UNIT/ML ~~LOC~~ SOPN: 10.0000 [IU] | PEN_INJECTOR | Freq: Every day | SUBCUTANEOUS | 1 refills | Status: DC

## 2018-07-25 NOTE — Patient Instructions (Addendum)
It was great to meet you today!   We are going to start a daily insulin: Tresiba (insulin degludec) 10 units daily. Increase by 1 unit every 4 days if fasting blood sugars are still greater than 130 mg/dL. Once fasting sugars are less than 130, stay on that dose.   Once we see how you tolerate this medication and we find a stable dose, we can submit to the manufacturer [employer]) for Patient Assistance.   Discontinue glipizide. Continue taking pioglitazone 15 mg daily.    Keep checking fasting blood sugars daily. A few days a week, check 2 hours after the largest meal.    Feel free to contact me with any questions. Please schedule follow up in ~3-4 weeks.    [name] Darnelle Maffucci, PharmD

## 2018-07-25 NOTE — Assessment & Plan Note (Signed)
#  ASCVD risk - primary prevention in patient aged 67-75 with DM; 10 year ASCVD risk <20%; moderate intensity statin indicated; LDL at goal on rosuvastatin 10 mg daily. - Continue rosuvastatin 10 mg daily

## 2018-07-25 NOTE — Assessment & Plan Note (Addendum)
#  Diabetes - Currently uncontrolled, most recent A1c 8.2% on 06/21/2018, stable from 8.3% on 12/06/2017; Goal A1c <7% Patient denies hypoglycemic events. Patient denies adherence with medication. Though GLP1 agents would be a preferred option, patient's underlying GI disease limits tolerability. Current renal function does not allow for SGLT2 initiation. Basal insulin is preferred. Patient is concerned about brand name medications putting her into the donut hole; Patient Assistance Programs can be utilized to reduce financial burden once a stable dose is determined - Initiate Tresiba (insulin degludec) 10 units daily. Increase by 1 unit every 4 days if fasting SMBG >130. Once SMBG consistently <130, maintain that dose - Discontinue glipizide to minimize hypoglycemia, as patient is taking it in the morning and not eating breakfast - Continue pioglitazone 15 mg daily - Encouraged patient to incorporate 2 hour post prandial SMBG checks

## 2018-07-25 NOTE — Assessment & Plan Note (Signed)
#  Hypertension currently controlled. Goal BP <140/90. Patient reports adherence with medication. - Continue losartan 100 mg daily, triamterene-HCTZ 37.5/25 mg 1/2 tablet daily, amlodipine 5 mg daily.

## 2018-07-25 NOTE — Progress Notes (Addendum)
S:     Chief Complaint  Patient presents with  . Medication Management    Diabetes    Patient arrives in good spirits, ambulating without assisatnce.  Presents for diabetes evaluation, education, and management at the request of Dr. Nicki Reaper  (referred in patient message on 06/26/2018). Last seen by primary care provider on 06/14/2018.   Patient reports diabetes was diagnosed in her late 57s. She notes that she had been well controlled on metformin + glipizide + pioglitazone for years, until her renal function declined and metformin was discontinued. She notes that she consistently has diarrhea daily d/t concurrent GI disease. She expresses concerns about taking medications that will put her into the donut hole, as she previously had this issue and had a difficult time affording all of her medications.   Family/Social History: Works at [employer]; Husband is on dialysis, does at [employer] coverage/medication affordability:   Patient reports adherence with medications.  - Was on metformin previously, but renal fx decline Current diabetes medications include: Glipizide XL 5 mg daily, pioglitazone 15 mg daily Current hypertension medications include: losartan 100 mg daily, triamterene-HCTZ 37.5/25 mg 1/2 tablet daily, amlodipine 5 mg daily.   Patient denies hypoglycemic s/sx including dizziness, shakiness, sweating. Patient reports hyperglycemic symptoms including nocturia.  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  Vitals reviewed.   Review of Systems  All other systems reviewed and are negative.   Lab Results  Component Value Date   HGBA1C 8.2 (H) 06/21/2018   Vitals:   07/25/18 1033  BP: 125/80  Pulse: 67    Basic Metabolic Panel BMP Latest Ref Rng & Units 07/15/2018 06/21/2018 03/07/2018  Glucose 70 - 99 mg/dL 202(H) 226(H) 150(H)  BUN 6 - 23 mg/dL 17 28(H) 21  Creatinine 0.40 - 1.20 mg/dL 1.16 1.15 1.12  Sodium 135 - 145 mEq/L 141 139 142  Potassium 3.5 - 5.1 mEq/L 3.3(L) 3.6 3.7  Chloride 96 - 112 mEq/L 106 104 107  CO2 19 - 32 mEq/L 25 28 26   Calcium 8.4 - 10.5 mg/dL 8.4 9.0 8.5    Lipid Panel     Component Value Date/Time   CHOL 143 06/21/2018 0958   TRIG 175.0 (H) 06/21/2018 0958   HDL 55.00 06/21/2018 0958   CHOLHDL 3 06/21/2018 0958   VLDL 35.0 06/21/2018 0958   LDLCALC 53 06/21/2018 0958   LDLDIRECT 120.0 12/06/2017 1344    Fasting SMBG: 180s-low 200s  Clinical ASCVD: No ;  ASCVD risk factors: age 65-75 10 year ASCVD risk: <15%   A/P: Following discussion and approval by Dr. Nicki Reaper, the following medication changes were made:   #Diabetes - Currently uncontrolled, most recent A1c 8.2% on 06/21/2018, stable from 8.3% on 12/06/2017; Goal A1c <7% Patient denies hypoglycemic events. Patient denies adherence with medication. Though GLP1 agents would be a preferred option, patient's underlying GI disease limits tolerability. Current renal function does not allow for SGLT2 initiation. Basal insulin is preferred. Patient is concerned about brand name medications putting her into the donut hole;  Patient Assistance Programs can be utilized to reduce financial burden once a stable dose is determined - Initiate Tresiba (insulin degludec) 10 units daily. Increase by 1 unit every 4 days if fasting SMBG >130. Once SMBG consistently <130, maintain that dose - Discontinue glipizide to minimize hypoglycemia, as patient is taking it in the morning and not eating  breakfast - Continue pioglitazone 15 mg daily - Encouraged patient to incorporate 2 hour post prandial SMBG checks  #Hypertension currently controlled. Goal BP <140/90. Patient reports adherence with medication. - Continue losartan 100 mg daily, triamterene-HCTZ 37.5/25 mg 1/2 tablet daily, amlodipine 5 mg daily.  #ASCVD risk - primary prevention in patient aged 70-75 with DM; 10 year ASCVD risk <20%; moderate intensity statin indicated; LDL at goal on rosuvastatin 10 mg daily. - Continue rosuvastatin 10 mg daily  Written patient instructions provided.  Total time in face to face counseling 40 minutes.    Follow up in Pharmacist Clinic Visit 4 weeks.   De Hollingshead, PharmD PGY2 [facility] Pharmacy Resident Phone: [phone]   Reviewed above information.  Agree with assessment and plan.    Dr Nicki Reaper

## 2018-07-26 ENCOUNTER — Ambulatory Visit (INDEPENDENT_AMBULATORY_CARE_PROVIDER_SITE_OTHER): Payer: PPO | Admitting: *Deleted

## 2018-07-26 DIAGNOSIS — D518 Other vitamin B12 deficiency anemias: Secondary | ICD-10-CM | POA: Diagnosis not present

## 2018-07-26 DIAGNOSIS — M6281 Muscle weakness (generalized): Secondary | ICD-10-CM | POA: Diagnosis not present

## 2018-07-26 DIAGNOSIS — M25612 Stiffness of left shoulder, not elsewhere classified: Secondary | ICD-10-CM | POA: Diagnosis not present

## 2018-07-26 DIAGNOSIS — M7502 Adhesive capsulitis of left shoulder: Secondary | ICD-10-CM | POA: Diagnosis not present

## 2018-07-26 MED ORDER — CYANOCOBALAMIN 1000 MCG/ML IJ SOLN
1000.0000 ug | Freq: Once | INTRAMUSCULAR | Status: AC
  Administered 2018-07-26: 1000 ug via INTRAMUSCULAR

## 2018-07-26 NOTE — Progress Notes (Signed)
Patient presented for B 12 injection to right deltoid, patient voiced no concerns nor showed any signs of distress during injection. 

## 2018-07-29 DIAGNOSIS — M7502 Adhesive capsulitis of left shoulder: Secondary | ICD-10-CM | POA: Diagnosis not present

## 2018-08-01 ENCOUNTER — Ambulatory Visit (INDEPENDENT_AMBULATORY_CARE_PROVIDER_SITE_OTHER): Payer: PPO

## 2018-08-01 DIAGNOSIS — Z23 Encounter for immunization: Secondary | ICD-10-CM | POA: Diagnosis not present

## 2018-08-02 DIAGNOSIS — M7502 Adhesive capsulitis of left shoulder: Secondary | ICD-10-CM | POA: Diagnosis not present

## 2018-08-07 ENCOUNTER — Other Ambulatory Visit: Payer: Self-pay | Admitting: Internal Medicine

## 2018-08-08 ENCOUNTER — Other Ambulatory Visit (INDEPENDENT_AMBULATORY_CARE_PROVIDER_SITE_OTHER): Payer: PPO

## 2018-08-08 ENCOUNTER — Encounter: Payer: Self-pay | Admitting: Internal Medicine

## 2018-08-08 DIAGNOSIS — E876 Hypokalemia: Secondary | ICD-10-CM | POA: Diagnosis not present

## 2018-08-08 LAB — POTASSIUM: POTASSIUM: 3.9 meq/L (ref 3.5–5.1)

## 2018-08-09 ENCOUNTER — Other Ambulatory Visit: Payer: PPO

## 2018-08-09 DIAGNOSIS — M25512 Pain in left shoulder: Secondary | ICD-10-CM | POA: Diagnosis not present

## 2018-08-09 DIAGNOSIS — M6281 Muscle weakness (generalized): Secondary | ICD-10-CM | POA: Diagnosis not present

## 2018-08-09 DIAGNOSIS — M7502 Adhesive capsulitis of left shoulder: Secondary | ICD-10-CM | POA: Diagnosis not present

## 2018-08-09 DIAGNOSIS — M25612 Stiffness of left shoulder, not elsewhere classified: Secondary | ICD-10-CM | POA: Diagnosis not present

## 2018-08-15 ENCOUNTER — Other Ambulatory Visit: Payer: Self-pay | Admitting: Student

## 2018-08-15 DIAGNOSIS — M7522 Bicipital tendinitis, left shoulder: Secondary | ICD-10-CM | POA: Diagnosis not present

## 2018-08-15 DIAGNOSIS — M7582 Other shoulder lesions, left shoulder: Secondary | ICD-10-CM | POA: Diagnosis not present

## 2018-08-15 DIAGNOSIS — M7502 Adhesive capsulitis of left shoulder: Secondary | ICD-10-CM | POA: Diagnosis not present

## 2018-08-22 ENCOUNTER — Ambulatory Visit (INDEPENDENT_AMBULATORY_CARE_PROVIDER_SITE_OTHER): Payer: PPO | Admitting: Pharmacist

## 2018-08-22 ENCOUNTER — Encounter: Payer: Self-pay | Admitting: Pharmacist

## 2018-08-22 VITALS — BP 114/73 | HR 68 | Wt 182.6 lb

## 2018-08-22 DIAGNOSIS — E1121 Type 2 diabetes mellitus with diabetic nephropathy: Secondary | ICD-10-CM

## 2018-08-22 MED ORDER — GLIPIZIDE 5 MG PO TABS: 5.0000 mg | ORAL_TABLET | Freq: Every day | ORAL | 1 refills | Status: DC

## 2018-08-22 NOTE — Assessment & Plan Note (Signed)
#  Diabetes - Currently uncontrolled but significantly improved per SMBG, most recent A1c 8.2% on 06/21/2018, stable from 8.3% on 12/06/2017; Goal A1c <7%. Though GLP1 agents would be a preferred option, patient's underlying GI disease limits tolerability. Current renal function does not allow for SGLT2 initiation. - Continue Tresiba titration. Increase by 1 unit every 4 days if fasting SMBG >130. Once SMBG consistently <130, maintain that dose - Restart glipizide 5 mg with lunch or supper, whichever meal will be bigger - Continue pioglitazone 15 mg daily - Encouraged patient to increase 2 hour post prandial SMBG checks

## 2018-08-22 NOTE — Patient Instructions (Addendum)
It was great to see you today! Thank you for all of the great data on blood sugars.   1) Keep increasing your [name] as directed. Increase by 1 unit every 4 days until fasting blood sugars are consistently less than 130.  2) Restart glipizide 5 mg. Take this ~30 minutes before whichever you plan to be your biggest meal. Continue checking your blood sugars about 2 hours after meals and recording these values to bring to clinic.    I will reach out to Dr. Nicki Reaper and see if she needs to see you on Wednesday. Larena Glassman will be in touch.    For these patient assistance programs, I will need you to bring in a copy of your 2018 tax returns. I can get your out of pocket spend amount directly from [facility].    Feel free to reach out with any questions or concerns. Schedule follow up with me in 5-6 weeks.    [name] Darnelle Maffucci, PharmD

## 2018-08-22 NOTE — Progress Notes (Addendum)
S:     Chief Complaint  Patient presents with  . Medication Management    Diabetes    Patient arrives in good spirits, ambulating without assisatnce.  Presents for diabetes evaluation, education, and management at the request of Dr. Nicki Reaper  (referred in patient message on 06/26/2018). Last seen by primary care provider on 06/14/2018. Last Pharmacy visit on 07/25/2018 - at that time, we started [name] with instructions to titrate to fastings <130.  Patient reports diabetes was diagnosed in her late 76s. She notes that she had been well controlled on metformin + glipizide + pioglitazone for years, until her renal function declined and metformin was discontinued. She notes that she consistently has diarrhea daily d/t concurrent GI disease.   Family/Social History: Works at [employer]; Husband is on home dialysis.  Insurance coverage/medication affordability: [employer];  - Expresses concerns with medications that will force her into the coverage gap  Patient reports adherence with medications.  - Was on metformin previously, but renal fx decline Current diabetes medications include: Pioglitazone 15 mg daily, Tresiba 16 units daily  Current hypertension medications include: losartan 100 mg daily, triamterene-HCTZ 37.5/25 mg 1/2 tablet daily, amlodipine 5 mg daily.   Patient denies hypoglycemic s/sx including dizziness, shakiness, sweating. Patient reports hyperglycemic symptoms including nocturia, but improvement.   Patient reported dietary habits: Eats 2 meals/day Breakfast: Usually doesn't eat breakfast Lunch: typically 12:30-1 pm; occasionally eats some samples at work ([employer]); sometimes [employer] as she has ~30 minutes for lunch and cannot bring lunch to work; When she's off, they will eat a bigger meal for lunch Dinner: typically ~ 6:30 pm; panko-battered fish, pork chops, chicken in air fryer; adheres to kidney function diet; will occasionally eat salads but  has a hard time digesting      O:  Physical Exam  Constitutional: She appears well-developed and well-nourished.  Vitals reviewed.   Review of Systems  All other systems reviewed and are negative.   Lab Results  Component Value Date   HGBA1C 8.2 (H) 06/21/2018   Vitals:   08/22/18 1051  BP: 114/73  Pulse: 68    Basic Metabolic Panel BMP Latest Ref Rng & Units 08/08/2018 07/15/2018 06/21/2018  Glucose 70 - 99 mg/dL - 202(H) 226(H)  BUN 6 - 23 mg/dL - 17 28(H)  Creatinine 0.40 - 1.20 mg/dL - 1.16 1.15  Sodium 135 - 145 mEq/L - 141 139  Potassium 3.5 - 5.1 mEq/L 3.9 3.3(L) 3.6  Chloride 96 - 112 mEq/L - 106 104  CO2 19 - 32 mEq/L - 25 28  Calcium 8.4 - 10.5 mg/dL - 8.4 9.0    Lipid Panel     Component Value Date/Time   CHOL 143 06/21/2018 0958   TRIG 175.0 (H) 06/21/2018 0958   HDL 55.00 06/21/2018 0958   CHOLHDL 3 06/21/2018 0958   VLDL 35.0 06/21/2018 0958   LDLCALC 53 06/21/2018 0958   LDLDIRECT 120.0 12/06/2017 1344    Fasting SMBG on 16 units: 123, 130, 132, 125 2 hour post prandial on 15 units: 289, 278   Clinical ASCVD: No ;  ASCVD risk factors: age 61-75 10 year ASCVD risk: <15%  A/P: Following discussion and approval by Dr. Derrel Nip, the following medication changes were made:   #Diabetes - Currently uncontrolled but significantly improved per SMBG, most recent A1c 8.2% on 06/21/2018, stable from 8.3% on 12/06/2017; Goal A1c <7%. Though GLP1 agents would be a preferred option, patient's underlying GI disease limits  tolerability. Current renal function does not allow for SGLT2 initiation. - Continue Tresiba titration. Increase by 1 unit every 4 days if fasting SMBG >130. Once SMBG consistently <130, maintain that dose - Restart glipizide 5 mg with lunch or supper, whichever meal will be bigger - Continue pioglitazone 15 mg daily - Encouraged patient to increase 2 hour post prandial SMBG checks  #Patient Assistance - Completing applications for Atrovent  ([employer]), Advair (GSK, requires $600 out of pocket spend), and Tresiba [employer], requires $1000 out of pocket spend).  - Instructed patient to bring copy of 2018 tax return by clinic.  - I will reach out to [employer] for out of pocket spend total.  Written patient instructions provided.  Total time in face to face counseling 40 minutes.    Follow up in Pharmacist Clinic Visit 5-6 weeks.   De Hollingshead, PharmD PGY2 Ambulatory Care Pharmacy Resident Phone: [phone]   I have reviewed the above information and agree with above.   Deborra Medina, MD

## 2018-08-23 ENCOUNTER — Encounter: Payer: Self-pay | Admitting: Internal Medicine

## 2018-08-24 ENCOUNTER — Other Ambulatory Visit: Payer: Self-pay | Admitting: Internal Medicine

## 2018-08-24 ENCOUNTER — Ambulatory Visit: Payer: PPO | Admitting: Internal Medicine

## 2018-08-24 MED ORDER — FLUTICASONE-SALMETEROL 250-50 MCG/DOSE IN AEPB
1.0000 | INHALATION_SPRAY | Freq: Two times a day (BID) | RESPIRATORY_TRACT | 1 refills | Status: DC
Start: 2018-08-24 — End: 2019-09-13

## 2018-08-24 NOTE — Progress Notes (Signed)
rx ok'd for advair #180 with 1 refill.

## 2018-08-24 NOTE — Telephone Encounter (Signed)
Patient did not come in for appt today, was just seen by [name] and meds were adjusted. Advised patient that she did not have to come in today unless she felt necessary. How soon would you like for me to reschedule her?

## 2018-08-24 NOTE — Telephone Encounter (Signed)
She sees [name] in several weeks, so schedule with me in 2 months.  Thanks

## 2018-08-29 NOTE — Telephone Encounter (Signed)
Pt scheduled  

## 2018-08-30 ENCOUNTER — Ambulatory Visit (INDEPENDENT_AMBULATORY_CARE_PROVIDER_SITE_OTHER): Payer: PPO

## 2018-08-30 DIAGNOSIS — D518 Other vitamin B12 deficiency anemias: Secondary | ICD-10-CM | POA: Diagnosis not present

## 2018-08-30 MED ORDER — CYANOCOBALAMIN 1000 MCG/ML IJ SOLN
1000.0000 ug | Freq: Once | INTRAMUSCULAR | Status: AC
Start: 2018-08-30 — End: 2018-08-30
  Administered 2018-08-30: 1000 ug via INTRAMUSCULAR

## 2018-08-30 NOTE — Progress Notes (Signed)
Pt was seen today for a NV for a b-12 shot given in RD. Pt tolerated well.

## 2018-09-01 DIAGNOSIS — H02132 Senile ectropion of right lower eyelid: Secondary | ICD-10-CM | POA: Diagnosis not present

## 2018-09-01 DIAGNOSIS — H02135 Senile ectropion of left lower eyelid: Secondary | ICD-10-CM | POA: Diagnosis not present

## 2018-09-03 ENCOUNTER — Ambulatory Visit
Admission: RE | Admit: 2018-09-03 | Discharge: 2018-09-03 | Disposition: A | Discharge status: Home / Self Care | Payer: PPO | Source: Ambulatory Visit | Attending: Student | Admitting: Student

## 2018-09-03 DIAGNOSIS — M7522 Bicipital tendinitis, left shoulder: Secondary | ICD-10-CM | POA: Insufficient documentation

## 2018-09-03 DIAGNOSIS — M7552 Bursitis of left shoulder: Secondary | ICD-10-CM | POA: Diagnosis not present

## 2018-09-03 DIAGNOSIS — M19012 Primary osteoarthritis, left shoulder: Secondary | ICD-10-CM | POA: Insufficient documentation

## 2018-09-03 DIAGNOSIS — S46012A Strain of muscle(s) and tendon(s) of the rotator cuff of left shoulder, initial encounter: Secondary | ICD-10-CM | POA: Insufficient documentation

## 2018-09-03 DIAGNOSIS — M7502 Adhesive capsulitis of left shoulder: Secondary | ICD-10-CM | POA: Insufficient documentation

## 2018-09-03 DIAGNOSIS — X58XXXA Exposure to other specified factors, initial encounter: Secondary | ICD-10-CM | POA: Diagnosis not present

## 2018-09-03 DIAGNOSIS — M7582 Other shoulder lesions, left shoulder: Secondary | ICD-10-CM | POA: Insufficient documentation

## 2018-09-05 ENCOUNTER — Telehealth: Payer: Self-pay | Admitting: Pharmacist

## 2018-09-05 ENCOUNTER — Telehealth: Payer: Self-pay | Admitting: Internal Medicine

## 2018-09-05 NOTE — Telephone Encounter (Signed)
Received patient's financial statements and out of pocket spend. Unfortunately, she will not qualify for [name] or Advair patient assistance due to her low out of pocket spend this calendar year. However, Atrovent ([employer]gle[employer]) does not require and out of pocket spend. Have faxed this in to [facility], and will follow up in 7-10 business days on status.    Contacted patient; for 2020, we may consider switching within class to medications whose manufacturers do not require out of pocket spend, to prevent patient from going into the Medicare Coverage Gap. Patient verbalized understanding.   Diabetes f/u appointment scheduled later this month.   [name] Darnelle Maffucci, PharmD PGY2 Ambulatory Care Pharmacy Resident, [place] [facility] Phone: [phone]

## 2018-09-05 NOTE — Telephone Encounter (Signed)
Copied from [place] [phone]. Topic: Referral - Medical Records >> Sep 05, 2018 12:03 PM Scherrie Gerlach wrote: Reason for CRM: Ashley Gross With Dr Jefm Bryant office calling to ask for the pt's last labs for  Vit D,  Calcium, and creatine. It is time for her reclast infusion Fax: [phone]

## 2018-09-05 NOTE — Telephone Encounter (Signed)
Reviewed.    Dr Torian Quintero 

## 2018-09-06 NOTE — Telephone Encounter (Signed)
Labs faxed to [facility]. 

## 2018-09-08 ENCOUNTER — Other Ambulatory Visit: Payer: Self-pay | Admitting: Internal Medicine

## 2018-09-09 DIAGNOSIS — M19012 Primary osteoarthritis, left shoulder: Secondary | ICD-10-CM | POA: Diagnosis not present

## 2018-09-09 DIAGNOSIS — M7582 Other shoulder lesions, left shoulder: Secondary | ICD-10-CM | POA: Diagnosis not present

## 2018-09-09 DIAGNOSIS — S46012D Strain of muscle(s) and tendon(s) of the rotator cuff of left shoulder, subsequent encounter: Secondary | ICD-10-CM | POA: Diagnosis not present

## 2018-09-09 DIAGNOSIS — S46012A Strain of muscle(s) and tendon(s) of the rotator cuff of left shoulder, initial encounter: Secondary | ICD-10-CM | POA: Insufficient documentation

## 2018-09-11 IMAGING — DX DG SHOULDER 2+V*L*
3 series · 3 of 3 positions shown · non-contrast
Comparison: Chest radiograph 08/13/2017

CLINICAL DATA: Left shoulder pain after picking up a heavy object
on 06/08/2018.

EXAM:
LEFT SHOULDER - 2+ VIEW

[shoulder (grashey) ap]
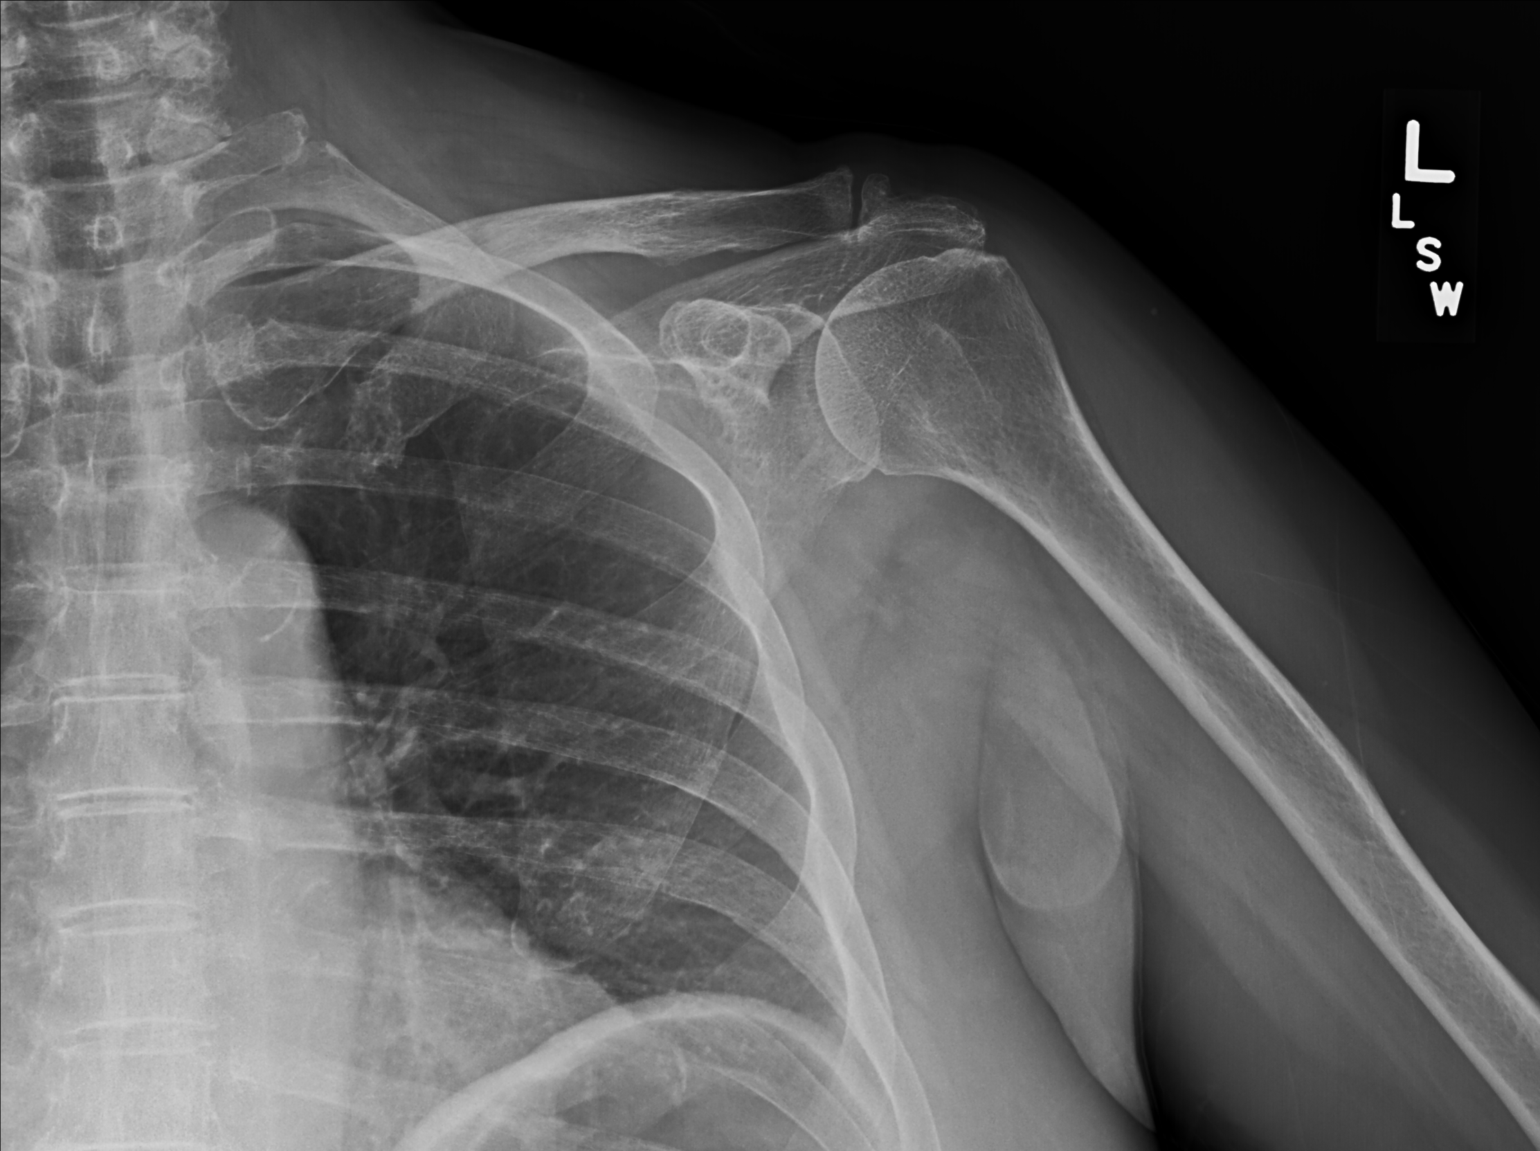

[shoulder y view]
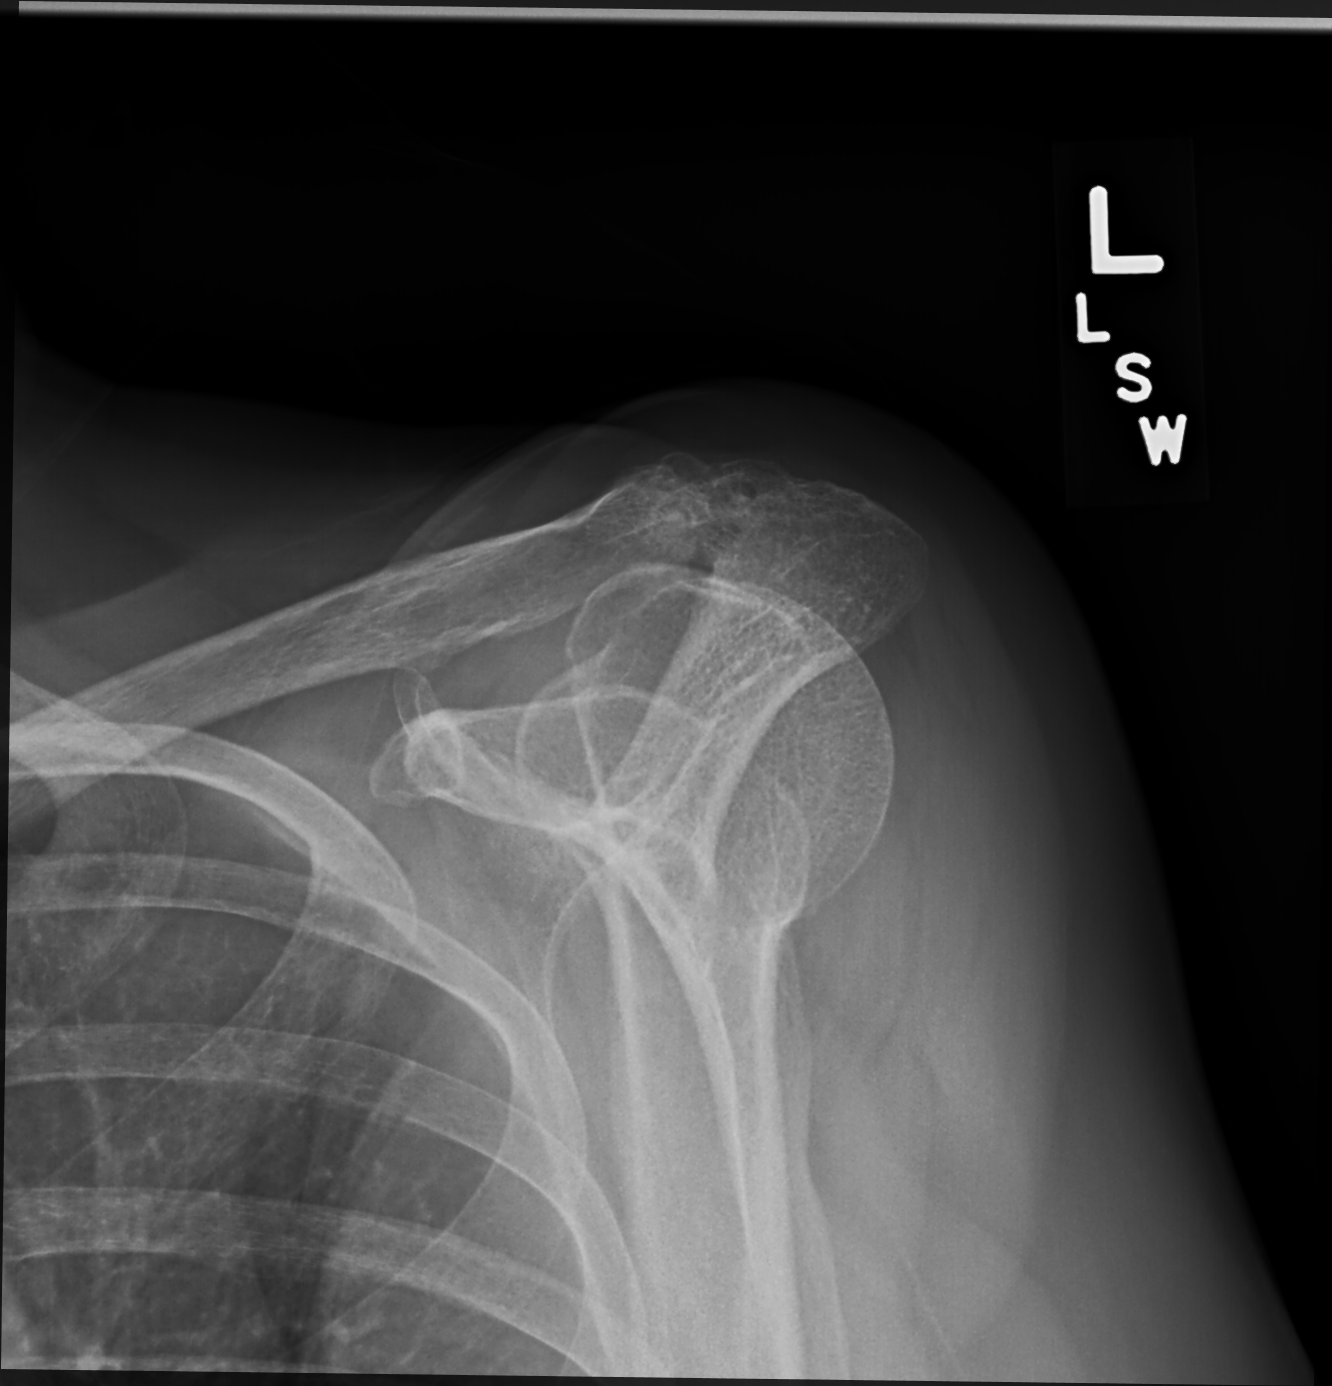

[shoulder axillary pa]
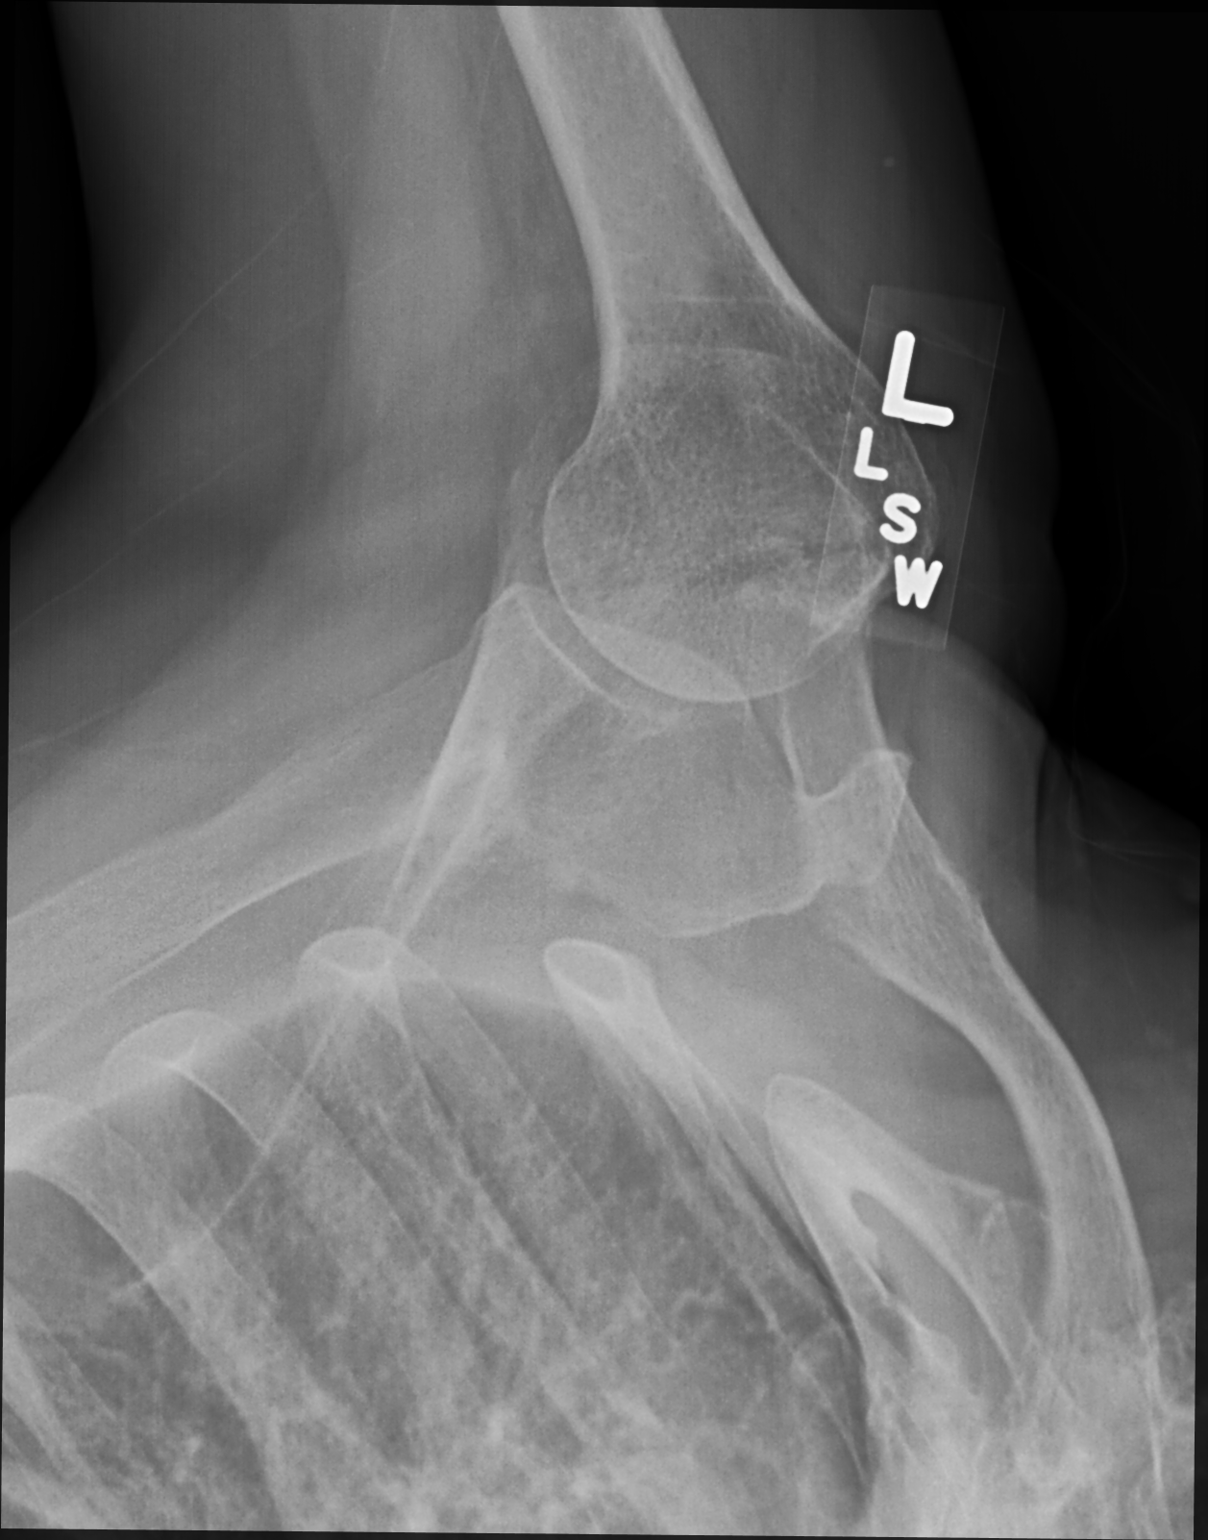

[3 of 3 positions shown; findings below may reference images not displayed]

FINDINGS: Left shoulder is located without a fracture. Degenerative changes at
the left AC joint. Alignment of the left AC joint is normal.
Visualized left ribs are intact.
IMPRESSION: No acute abnormality to left shoulder.

Degenerative disease at the left AC joint.

## 2018-09-13 ENCOUNTER — Other Ambulatory Visit: Payer: Self-pay | Admitting: Internal Medicine

## 2018-09-15 DIAGNOSIS — E785 Hyperlipidemia, unspecified: Secondary | ICD-10-CM | POA: Diagnosis not present

## 2018-09-15 DIAGNOSIS — I10 Essential (primary) hypertension: Secondary | ICD-10-CM | POA: Diagnosis not present

## 2018-09-15 DIAGNOSIS — E118 Type 2 diabetes mellitus with unspecified complications: Secondary | ICD-10-CM | POA: Diagnosis not present

## 2018-09-15 DIAGNOSIS — F320 Major depressive disorder, single episode, mild: Secondary | ICD-10-CM | POA: Insufficient documentation

## 2018-09-15 DIAGNOSIS — Z794 Long term (current) use of insulin: Secondary | ICD-10-CM | POA: Insufficient documentation

## 2018-09-15 DIAGNOSIS — J4520 Mild intermittent asthma, uncomplicated: Secondary | ICD-10-CM | POA: Insufficient documentation

## 2018-09-15 DIAGNOSIS — K210 Gastro-esophageal reflux disease with esophagitis: Secondary | ICD-10-CM | POA: Diagnosis not present

## 2018-09-15 DIAGNOSIS — F32A Depression, unspecified: Secondary | ICD-10-CM | POA: Insufficient documentation

## 2018-09-15 DIAGNOSIS — E119 Type 2 diabetes mellitus without complications: Secondary | ICD-10-CM | POA: Insufficient documentation

## 2018-09-15 DIAGNOSIS — F329 Major depressive disorder, single episode, unspecified: Secondary | ICD-10-CM | POA: Diagnosis not present

## 2018-09-15 DIAGNOSIS — E1165 Type 2 diabetes mellitus with hyperglycemia: Secondary | ICD-10-CM | POA: Insufficient documentation

## 2018-09-16 ENCOUNTER — Encounter
Admission: RE | Admit: 2018-09-16 | Discharge: 2018-09-16 | Disposition: A | Discharge status: Home / Self Care | Payer: PPO | Source: Ambulatory Visit | Attending: Surgery | Admitting: Surgery

## 2018-09-16 ENCOUNTER — Other Ambulatory Visit: Payer: Self-pay

## 2018-09-16 ENCOUNTER — Telehealth: Payer: Self-pay | Admitting: Pharmacist

## 2018-09-16 DIAGNOSIS — I440 Atrioventricular block, first degree: Secondary | ICD-10-CM | POA: Diagnosis not present

## 2018-09-16 DIAGNOSIS — J45909 Unspecified asthma, uncomplicated: Secondary | ICD-10-CM | POA: Diagnosis not present

## 2018-09-16 DIAGNOSIS — Z01810 Encounter for preprocedural cardiovascular examination: Secondary | ICD-10-CM | POA: Insufficient documentation

## 2018-09-16 DIAGNOSIS — I10 Essential (primary) hypertension: Secondary | ICD-10-CM | POA: Diagnosis not present

## 2018-09-16 DIAGNOSIS — E119 Type 2 diabetes mellitus without complications: Secondary | ICD-10-CM | POA: Diagnosis not present

## 2018-09-16 HISTORY — DX: Cardiac murmur, unspecified: R01.1

## 2018-09-16 HISTORY — DX: Pneumonia, unspecified organism: J18.9

## 2018-09-16 NOTE — Patient Instructions (Addendum)
  Your procedure is scheduled on: Tuesday September 20, 2018 Report to Same Day Surgery 2nd floor [facility] Entrance-take elevator on left to 2nd floor.  Check in with surgery information desk.) To find out your arrival time, call [phone] 1:00-3:00 PM on Monday September 19, 2018  Remember: Instructions that are not followed completely may result in serious medical risk, up to and including death, or upon the discretion of your surgeon and anesthesiologist your surgery may need to be rescheduled.    __x__ 1. Do not eat food (including mints, candies, chewing gum) after midnight the night before your procedure. You may drink water up to 2 hours before you are scheduled to arrive at the hospital for your procedure.  Do not drink anything within 2 hours of your scheduled arrival to the hospital.    __x__ 2. No Alcohol for 24 hours before or after surgery.   __x__ 3. No Smoking or e-cigarettes for 24 hours before surgery.  Do not use any chewable tobacco products for at least 6 hours before surgery.   __x__ 4. Notify your doctor if there is any change in your medical condition (cold, fever, infections).   __x__ 5. On the morning of surgery brush your teeth with toothpaste and water.  You may rinse your mouth with mouthwash if you wish.  Do not swallow any toothpaste or mouthwash.  Please read over the following fact sheets that you were given:   [facility] Preparing for Surgery and/or MRSA Information    __x__ Use CHG Soap or Sage wipes as directed on instruction sheet   Do not wear jewelry, make-up, hairpins, clips or nail polish on the day of surgery.  Do not wear lotions, powders, deodorant, or perfumes.   Do not shave below the face/neck 48 hours prior to surgery.   Do not bring valuables to the hospital.    [facility] is not responsible for any belongings or valuables.               Contacts, dentures or bridgework may not be worn into surgery.  For patients  discharged on the day of surgery, you will NOT be permitted to drive yourself home.  You must have a responsible adult with you for 24 hours after surgery.  __x__ Take anti-hypertensive listed below, cardiac, seizure, asthma, anti-reflux and psychiatric medicines. These include:  1. Amlodipine/Norvasc on the morning of surgery  2. Inhalers- Advair  __x__ Take no insulin on the morning of surgery.   __x__ Follow recommendations from Cardiologist, Pulmonologist or PCP regarding stopping Aspirin, Coumadin, Plavix, Eliquis, Effient, Pradaxa, and Pletal.  __x__  Stop Anti-inflammatories such as Advil, Ibuprofen, Motrin, Aleve, Naproxen, Naprosyn, BC/Goodies powders or aspirin products. You may continue to take Tylenol and Celebrex.   __x__  Stop supplements until after surgery. You may continue to take Vitamin D, Vitamin B, and multivitamin.

## 2018-09-16 NOTE — Telephone Encounter (Signed)
Contacted [employer] patient assistance. Unfortunately, patient was denied for Atrovent assistance due to being over the income limit.   Contacted patient, left HIPAA compliant message informing of this. We have an appointment scheduled later this month for diabetes f/u.   [name] Darnelle Maffucci, PharmD PGY2 Ambulatory Care Pharmacy Resident, [place] [facility] Phone: [phone]

## 2018-09-19 MED ORDER — CEFAZOLIN SODIUM-DEXTROSE 2-4 GM/100ML-% IV SOLN
2.0000 g | Freq: Once | INTRAVENOUS | Status: AC
  Administered 2018-09-20: 2 g via INTRAVENOUS

## 2018-09-20 ENCOUNTER — Encounter
Admission: RE | Disposition: A | Discharge status: Home / Self Care | Payer: Self-pay | Source: Ambulatory Visit | Attending: Surgery

## 2018-09-20 ENCOUNTER — Ambulatory Visit: Payer: PPO | Admitting: Anesthesiology

## 2018-09-20 ENCOUNTER — Ambulatory Visit
Admission: RE | Admit: 2018-09-20 | Discharge: 2018-09-20 | Disposition: A | Discharge status: Home / Self Care | Payer: PPO | Source: Ambulatory Visit | Attending: Surgery | Admitting: Surgery

## 2018-09-20 ENCOUNTER — Other Ambulatory Visit: Payer: Self-pay

## 2018-09-20 DIAGNOSIS — M25512 Pain in left shoulder: Secondary | ICD-10-CM | POA: Diagnosis not present

## 2018-09-20 DIAGNOSIS — L4050 Arthropathic psoriasis, unspecified: Secondary | ICD-10-CM | POA: Insufficient documentation

## 2018-09-20 DIAGNOSIS — E119 Type 2 diabetes mellitus without complications: Secondary | ICD-10-CM | POA: Diagnosis not present

## 2018-09-20 DIAGNOSIS — M7522 Bicipital tendinitis, left shoulder: Secondary | ICD-10-CM | POA: Diagnosis not present

## 2018-09-20 DIAGNOSIS — M7582 Other shoulder lesions, left shoulder: Secondary | ICD-10-CM | POA: Diagnosis not present

## 2018-09-20 DIAGNOSIS — D649 Anemia, unspecified: Secondary | ICD-10-CM | POA: Insufficient documentation

## 2018-09-20 DIAGNOSIS — Y990 Civilian activity done for income or pay: Secondary | ICD-10-CM | POA: Insufficient documentation

## 2018-09-20 DIAGNOSIS — S46012A Strain of muscle(s) and tendon(s) of the rotator cuff of left shoulder, initial encounter: Secondary | ICD-10-CM | POA: Insufficient documentation

## 2018-09-20 DIAGNOSIS — J45909 Unspecified asthma, uncomplicated: Secondary | ICD-10-CM | POA: Diagnosis not present

## 2018-09-20 DIAGNOSIS — Z886 Allergy status to analgesic agent status: Secondary | ICD-10-CM | POA: Diagnosis not present

## 2018-09-20 DIAGNOSIS — M7542 Impingement syndrome of left shoulder: Secondary | ICD-10-CM | POA: Diagnosis not present

## 2018-09-20 DIAGNOSIS — Y9289 Other specified places as the place of occurrence of the external cause: Secondary | ICD-10-CM | POA: Insufficient documentation

## 2018-09-20 DIAGNOSIS — M779 Enthesopathy, unspecified: Secondary | ICD-10-CM | POA: Diagnosis not present

## 2018-09-20 DIAGNOSIS — X509XXA Other and unspecified overexertion or strenuous movements or postures, initial encounter: Secondary | ICD-10-CM | POA: Diagnosis not present

## 2018-09-20 DIAGNOSIS — E7800 Pure hypercholesterolemia, unspecified: Secondary | ICD-10-CM | POA: Diagnosis not present

## 2018-09-20 DIAGNOSIS — M19012 Primary osteoarthritis, left shoulder: Secondary | ICD-10-CM | POA: Insufficient documentation

## 2018-09-20 DIAGNOSIS — Z79899 Other long term (current) drug therapy: Secondary | ICD-10-CM | POA: Diagnosis not present

## 2018-09-20 DIAGNOSIS — Z7951 Long term (current) use of inhaled steroids: Secondary | ICD-10-CM | POA: Insufficient documentation

## 2018-09-20 DIAGNOSIS — Z888 Allergy status to other drugs, medicaments and biological substances status: Secondary | ICD-10-CM | POA: Diagnosis not present

## 2018-09-20 DIAGNOSIS — I10 Essential (primary) hypertension: Secondary | ICD-10-CM | POA: Insufficient documentation

## 2018-09-20 DIAGNOSIS — Z881 Allergy status to other antibiotic agents status: Secondary | ICD-10-CM | POA: Insufficient documentation

## 2018-09-20 DIAGNOSIS — Z794 Long term (current) use of insulin: Secondary | ICD-10-CM | POA: Insufficient documentation

## 2018-09-20 DIAGNOSIS — G8918 Other acute postprocedural pain: Secondary | ICD-10-CM | POA: Diagnosis not present

## 2018-09-20 HISTORY — PX: SHOULDER ARTHROSCOPY WITH OPEN ROTATOR CUFF REPAIR: SHX6092

## 2018-09-20 LAB — GLUCOSE, CAPILLARY
GLUCOSE-CAPILLARY: 92 mg/dL (ref 70–99)
Glucose-Capillary: 83 mg/dL (ref 70–99)
Glucose-Capillary: 118 mg/dL — ABNORMAL HIGH (ref 70–99)

## 2018-09-20 SURGERY — ARTHROSCOPY, SHOULDER WITH REPAIR, ROTATOR CUFF, OPEN
Anesthesia: General | Site: Shoulder | Laterality: Left

## 2018-09-20 MED ORDER — DEXAMETHASONE SODIUM PHOSPHATE 4 MG/ML IJ SOLN
INTRAMUSCULAR | Status: DC | PRN
  Administered 2018-09-20: 5 mg via INTRAVENOUS

## 2018-09-20 MED ORDER — GLYCOPYRROLATE 0.2 MG/ML IJ SOLN
INTRAMUSCULAR | Status: DC | PRN
  Administered 2018-09-20: .2 mg via INTRAVENOUS
  Administered 2018-09-20: 0.2 mg via INTRAVENOUS

## 2018-09-20 MED ORDER — GLYCOPYRROLATE 0.2 MG/ML IJ SOLN
INTRAMUSCULAR | Status: AC
  Filled 2018-09-20: qty 1

## 2018-09-20 MED ORDER — PROPOFOL 10 MG/ML IV BOLUS
INTRAVENOUS | Status: AC
  Filled 2018-09-20: qty 20

## 2018-09-20 MED ORDER — VASOPRESSIN 20 UNIT/ML IV SOLN
INTRAVENOUS | Status: AC
  Filled 2018-09-20: qty 1

## 2018-09-20 MED ORDER — BUPIVACAINE-EPINEPHRINE 0.5% -1:200000 IJ SOLN
INTRAMUSCULAR | Status: DC | PRN
  Administered 2018-09-20: 30 mL

## 2018-09-20 MED ORDER — BUPIVACAINE LIPOSOME 1.3 % IJ SUSP
INTRAMUSCULAR | Status: DC | PRN
  Administered 2018-09-20: 7 mL via PERINEURAL
  Administered 2018-09-20: 13 mL via PERINEURAL

## 2018-09-20 MED ORDER — LIDOCAINE HCL (PF) 1 % IJ SOLN
INTRAMUSCULAR | Status: AC
  Filled 2018-09-20: qty 5

## 2018-09-20 MED ORDER — LACTATED RINGERS IV SOLN
INTRAVENOUS | Status: DC | PRN
  Administered 2018-09-20: 2 mL

## 2018-09-20 MED ORDER — BUPIVACAINE-EPINEPHRINE (PF) 0.5% -1:200000 IJ SOLN
INTRAMUSCULAR | Status: AC
  Filled 2018-09-20: qty 30

## 2018-09-20 MED ORDER — BUPIVACAINE HCL (PF) 0.5 % IJ SOLN
INTRAMUSCULAR | Status: AC
  Filled 2018-09-20: qty 10

## 2018-09-20 MED ORDER — IPRATROPIUM-ALBUTEROL 0.5-2.5 (3) MG/3ML IN SOLN
3.0000 mL | Freq: Once | RESPIRATORY_TRACT | Status: AC
  Administered 2018-09-20: 3 mL via RESPIRATORY_TRACT

## 2018-09-20 MED ORDER — EPINEPHRINE PF 1 MG/ML IJ SOLN
INTRAMUSCULAR | Status: AC
  Filled 2018-09-20: qty 2

## 2018-09-20 MED ORDER — POTASSIUM CHLORIDE IN NACL 20-0.9 MEQ/L-% IV SOLN
INTRAVENOUS | Status: DC
  Filled 2018-09-20: qty 1000

## 2018-09-20 MED ORDER — OXYCODONE HCL 5 MG PO TABS: 5.0000 mg | ORAL_TABLET | ORAL | Status: DC | PRN

## 2018-09-20 MED ORDER — OXYCODONE HCL 5 MG PO TABS: 5.0000 mg | ORAL_TABLET | ORAL | 0 refills | Status: DC | PRN

## 2018-09-20 MED ORDER — FENTANYL CITRATE (PF) 100 MCG/2ML IJ SOLN
25.0000 ug | INTRAMUSCULAR | Status: DC | PRN
  Administered 2018-09-20 (×2): 50 ug via INTRAVENOUS

## 2018-09-20 MED ORDER — MIDAZOLAM HCL 2 MG/2ML IJ SOLN
INTRAMUSCULAR | Status: AC
  Administered 2018-09-20: 1 mg
  Filled 2018-09-20: qty 2

## 2018-09-20 MED ORDER — ROCURONIUM BROMIDE 100 MG/10ML IV SOLN
INTRAVENOUS | Status: DC | PRN
  Administered 2018-09-20: 75 mg via INTRAVENOUS

## 2018-09-20 MED ORDER — PROPOFOL 10 MG/ML IV BOLUS
INTRAVENOUS | Status: DC | PRN
  Administered 2018-09-20: 150 mg via INTRAVENOUS

## 2018-09-20 MED ORDER — BUPIVACAINE HCL (PF) 0.5 % IJ SOLN
INTRAMUSCULAR | Status: DC | PRN
  Administered 2018-09-20: 7 mL via PERINEURAL
  Administered 2018-09-20: 3 mL via PERINEURAL

## 2018-09-20 MED ORDER — FENTANYL CITRATE (PF) 100 MCG/2ML IJ SOLN
INTRAMUSCULAR | Status: DC | PRN
  Administered 2018-09-20 (×4): 50 ug via INTRAVENOUS

## 2018-09-20 MED ORDER — ACETAMINOPHEN 10 MG/ML IV SOLN
INTRAVENOUS | Status: AC
  Filled 2018-09-20: qty 100

## 2018-09-20 MED ORDER — FENTANYL CITRATE (PF) 250 MCG/5ML IJ SOLN
INTRAMUSCULAR | Status: AC
  Filled 2018-09-20: qty 5

## 2018-09-20 MED ORDER — FENTANYL CITRATE (PF) 100 MCG/2ML IJ SOLN
INTRAMUSCULAR | Status: AC
  Administered 2018-09-20: 50 ug
  Filled 2018-09-20: qty 2

## 2018-09-20 MED ORDER — OXYCODONE HCL 5 MG PO TABS
ORAL_TABLET | ORAL | Status: AC
  Filled 2018-09-20: qty 1

## 2018-09-20 MED ORDER — IPRATROPIUM-ALBUTEROL 0.5-2.5 (3) MG/3ML IN SOLN
RESPIRATORY_TRACT | Status: AC
  Filled 2018-09-20: qty 3

## 2018-09-20 MED ORDER — ONDANSETRON HCL 4 MG/2ML IJ SOLN: 4.0000 mg | Freq: Four times a day (QID) | INTRAMUSCULAR | Status: DC | PRN

## 2018-09-20 MED ORDER — FENTANYL CITRATE (PF) 100 MCG/2ML IJ SOLN: 25.0000 ug | Freq: Once | INTRAMUSCULAR | Status: DC

## 2018-09-20 MED ORDER — ACETAMINOPHEN 10 MG/ML IV SOLN
INTRAVENOUS | Status: DC | PRN
  Administered 2018-09-20: 1000 mg via INTRAVENOUS

## 2018-09-20 MED ORDER — LIDOCAINE HCL (CARDIAC) PF 100 MG/5ML IV SOSY
PREFILLED_SYRINGE | INTRAVENOUS | Status: DC | PRN
  Administered 2018-09-20: 100 mg via INTRAVENOUS

## 2018-09-20 MED ORDER — FENTANYL CITRATE (PF) 100 MCG/2ML IJ SOLN
INTRAMUSCULAR | Status: AC
  Administered 2018-09-20: 50 ug via INTRAVENOUS
  Filled 2018-09-20: qty 2

## 2018-09-20 MED ORDER — LACTATED RINGERS IV SOLN
INTRAVENOUS | Status: DC | PRN
  Administered 2018-09-20: 12:00:00 via INTRAVENOUS

## 2018-09-20 MED ORDER — METOCLOPRAMIDE HCL 10 MG PO TABS: 5.0000 mg | ORAL_TABLET | Freq: Three times a day (TID) | ORAL | Status: DC | PRN

## 2018-09-20 MED ORDER — OXYCODONE HCL 5 MG/5ML PO SOLN: 5.0000 mg | Freq: Once | ORAL | Status: AC | PRN

## 2018-09-20 MED ORDER — OXYCODONE HCL 5 MG PO TABS
5.0000 mg | ORAL_TABLET | Freq: Once | ORAL | Status: AC | PRN
  Administered 2018-09-20: 5 mg via ORAL

## 2018-09-20 MED ORDER — BUPIVACAINE LIPOSOME 1.3 % IJ SUSP
INTRAMUSCULAR | Status: AC
  Filled 2018-09-20: qty 20

## 2018-09-20 MED ORDER — CEFAZOLIN SODIUM-DEXTROSE 2-4 GM/100ML-% IV SOLN
INTRAVENOUS | Status: AC
  Filled 2018-09-20: qty 100

## 2018-09-20 MED ORDER — SUGAMMADEX SODIUM 200 MG/2ML IV SOLN
INTRAVENOUS | Status: DC | PRN
  Administered 2018-09-20: 200 mg via INTRAVENOUS

## 2018-09-20 MED ORDER — MIDAZOLAM HCL 2 MG/2ML IJ SOLN: 1.0000 mg | Freq: Once | INTRAMUSCULAR | Status: DC

## 2018-09-20 MED ORDER — METOCLOPRAMIDE HCL 5 MG/ML IJ SOLN: 5.0000 mg | Freq: Three times a day (TID) | INTRAMUSCULAR | Status: DC | PRN

## 2018-09-20 MED ORDER — ROCURONIUM BROMIDE 50 MG/5ML IV SOLN
INTRAVENOUS | Status: AC
  Filled 2018-09-20: qty 2

## 2018-09-20 MED ORDER — ONDANSETRON HCL 4 MG/2ML IJ SOLN
INTRAMUSCULAR | Status: DC | PRN
  Administered 2018-09-20: 4 mg via INTRAVENOUS

## 2018-09-20 MED ORDER — LIDOCAINE HCL (PF) 2 % IJ SOLN
INTRAMUSCULAR | Status: AC
  Filled 2018-09-20: qty 10

## 2018-09-20 MED ORDER — PHENYLEPHRINE HCL 10 MG/ML IJ SOLN
INTRAMUSCULAR | Status: DC | PRN
  Administered 2018-09-20: 300 ug via INTRAVENOUS
  Administered 2018-09-20: 200 ug via INTRAVENOUS
  Administered 2018-09-20: 100 ug via INTRAVENOUS

## 2018-09-20 MED ORDER — SODIUM CHLORIDE 0.9 % IV SOLN
INTRAVENOUS | Status: DC
  Administered 2018-09-20: 09:00:00 via INTRAVENOUS

## 2018-09-20 MED ORDER — PHENYLEPHRINE HCL 10 MG/ML IJ SOLN
INTRAVENOUS | Status: DC | PRN
  Administered 2018-09-20: 40 ug/min via INTRAVENOUS

## 2018-09-20 MED ORDER — ONDANSETRON HCL 4 MG PO TABS: 4.0000 mg | ORAL_TABLET | Freq: Four times a day (QID) | ORAL | Status: DC | PRN

## 2018-09-20 SURGICAL SUPPLY — 44 items
ANCHOR JUGGERKNOT WTAP NDL 2.9 (Anchor) ×4 IMPLANT
ANCHOR SUT QUATTRO KNTLS 4.5 (Anchor) ×2 IMPLANT
BIT DRILL JUGRKNT W/NDL BIT2.9 (DRILL) ×1 IMPLANT
BLADE FULL RADIUS 3.5 (BLADE) ×2 IMPLANT
BUR ACROMIONIZER 4.0 (BURR) ×2 IMPLANT
CANNULA SHAVER 8MMX76MM (CANNULA) ×2 IMPLANT
CHLORAPREP W/TINT 26ML (MISCELLANEOUS) ×2 IMPLANT
COVER MAYO STAND STRL (DRAPES) ×2 IMPLANT
COVER WAND RF STERILE (DRAPES) IMPLANT
DRAPE IMP U-DRAPE 54X76 (DRAPES) ×4 IMPLANT
DRILL JUGGERKNOT W/NDL BIT 2.9 (DRILL) ×2
ELECT REM PT RETURN 9FT ADLT (ELECTROSURGICAL) ×2
ELECTRODE REM PT RTRN 9FT ADLT (ELECTROSURGICAL) ×1 IMPLANT
GAUZE PETRO XEROFOAM 1X8 (MISCELLANEOUS) ×2 IMPLANT
GAUZE SPONGE 4X4 12PLY STRL (GAUZE/BANDAGES/DRESSINGS) ×2 IMPLANT
GLOVE BIO SURGEON STRL SZ7.5 (GLOVE) ×4 IMPLANT
GLOVE BIO SURGEON STRL SZ8 (GLOVE) ×4 IMPLANT
GLOVE BIOGEL PI IND STRL 8 (GLOVE) ×1 IMPLANT
GLOVE BIOGEL PI INDICATOR 8 (GLOVE) ×1
GLOVE INDICATOR 8.0 STRL GRN (GLOVE) ×2 IMPLANT
GOWN STRL REUS W/ TWL LRG LVL3 (GOWN DISPOSABLE) ×1 IMPLANT
GOWN STRL REUS W/ TWL XL LVL3 (GOWN DISPOSABLE) ×1 IMPLANT
GOWN STRL REUS W/TWL LRG LVL3 (GOWN DISPOSABLE) ×1
GOWN STRL REUS W/TWL XL LVL3 (GOWN DISPOSABLE) ×1
GRASPER SUT 15 45D LOW PRO (SUTURE) IMPLANT
IV LACTATED RINGER IRRG 3000ML (IV SOLUTION) ×2
IV LR IRRIG 3000ML ARTHROMATIC (IV SOLUTION) ×2 IMPLANT
MANIFOLD NEPTUNE II (INSTRUMENTS) ×2 IMPLANT
MASK FACE SPIDER DISP (MASK) ×2 IMPLANT
MAT ABSORB  FLUID 56X50 GRAY (MISCELLANEOUS) ×1
MAT ABSORB FLUID 56X50 GRAY (MISCELLANEOUS) ×1 IMPLANT
PACK ARTHROSCOPY SHOULDER (MISCELLANEOUS) ×2 IMPLANT
SLING ARM LRG DEEP (SOFTGOODS) IMPLANT
SLING ULTRA II LG (MISCELLANEOUS) IMPLANT
STAPLER SKIN PROX 35W (STAPLE) ×2 IMPLANT
STRAP SAFETY 5IN WIDE (MISCELLANEOUS) ×2 IMPLANT
SUT ETHIBOND 0 MO6 C/R (SUTURE) ×2 IMPLANT
SUT VIC AB 2-0 CT1 27 (SUTURE) ×2
SUT VIC AB 2-0 CT1 TAPERPNT 27 (SUTURE) ×2 IMPLANT
TAPE MICROFOAM 4IN (TAPE) ×2 IMPLANT
TUBING ARTHRO INFLOW-ONLY STRL (TUBING) ×2 IMPLANT
TUBING CONNECTING 10 (TUBING) ×2 IMPLANT
WAND HAND CNTRL MULTIVAC 90 (MISCELLANEOUS) ×2 IMPLANT
WAND WEREWOLF FLOW 90D (MISCELLANEOUS) ×2 IMPLANT

## 2018-09-20 NOTE — Anesthesia Preprocedure Evaluation (Signed)
Anesthesia Evaluation  Patient identified by MRN, date of birth, ID band Patient awake    Reviewed: Allergy & Precautions, H&P , NPO status , Patient's Chart, lab work & pertinent test results  History of Anesthesia Complications Negative for: history of anesthetic complications  Airway Mallampati: III  TM Distance: <3 FB Neck ROM: limited    Dental  (+) Chipped   Pulmonary neg shortness of breath, asthma , pneumonia,           Cardiovascular Exercise Tolerance: Good hypertension, (-) Past MI and (-) DOE + Valvular Problems/Murmurs      Neuro/Psych negative neurological ROS  negative psych ROS   GI/Hepatic Neg liver ROS, hiatal hernia, GERD  Medicated and Controlled,  Endo/Other  diabetes, Type 2, Insulin Dependent  Renal/GU Renal disease     Musculoskeletal  (+) Arthritis ,   Abdominal   Peds  Hematology negative hematology ROS (+)   Anesthesia Other Findings Past Medical History: No date: Anemia, unspecified No date: Asthma No date: Colitis No date: Diabetes mellitus (HCC) No date: Esophagitis No date: H/O hiatal hernia No date: Heart murmur No date: Hx of arteriovenous malformation (AVM) No date: Hypertension No date: IBS (irritable bowel syndrome) No date: Nephrolithiasis No date: Pernicious anemia No date: Pneumonia No date: Psoriatic arthritis (HCC)     Comment:  s/p penicillamine, plaquenil, MTX, sulfasalazine.  s/p               Embrel, Humira,.  Iritis.   No date: Pure hypercholesterolemia  Past Surgical History: 1981: ABDOMINAL HYSTERECTOMY     Comment:  ovaries left in place No date: ANKLE SURGERY     Comment:  right No date: BUNIONECTOMY 1989: CHOLECYSTECTOMY 12/27/2017: COLONOSCOPY WITH PROPOFOL; N/A     Comment:  Procedure: COLONOSCOPY WITH PROPOFOL;  Surgeon: Lin Landsman, MD;  Location: [facility] ENDOSCOPY;  Service:               Gastroenterology;  Laterality:  N/A; 12/27/2017: ESOPHAGOGASTRODUODENOSCOPY (EGD) WITH PROPOFOL; N/A     Comment:  Procedure: ESOPHAGOGASTRODUODENOSCOPY (EGD) WITH               PROPOFOL;  Surgeon: Lin Landsman, MD;  Location:               [facility] ENDOSCOPY;  Service: Gastroenterology;  Laterality:               N/A; No date: HAND SURGERY     Comment:  right No date: HEEL SPUR SURGERY No date: NOSE SURGERY     Comment:  x2 No date: UMBILICAL HERNIA REPAIR  BMI    Body Mass Index:  31.48 kg/m      Reproductive/Obstetrics negative OB ROS                             Anesthesia Physical Anesthesia Plan  ASA: III  Anesthesia Plan: General ETT   Post-op Pain Management: GA combined w/ Regional for post-op pain   Induction: Intravenous  PONV Risk Score and Plan: Ondansetron, Dexamethasone, Midazolam and Treatment may vary due to age or medical condition  Airway Management Planned: Oral ETT  Additional Equipment:   Intra-op Plan:   Post-operative Plan: Extubation in OR  Informed Consent: I have reviewed the patients History and Physical, chart, labs and discussed the procedure including the risks, benefits and alternatives for the  proposed anesthesia with the patient or authorized representative who has indicated his/her understanding and acceptance.   Dental Advisory Given  Plan Discussed with: Anesthesiologist, CRNA and Surgeon  Anesthesia Plan Comments: (Patient consented for risks of anesthesia including but not limited to:  - adverse reactions to medications - damage to teeth, lips or other oral mucosa - sore throat or hoarseness - Damage to heart, brain, lungs or loss of life  Patient voiced understanding.)        Anesthesia Quick Evaluation

## 2018-09-20 NOTE — Anesthesia Procedure Notes (Signed)
Anesthesia Regional Block: Interscalene brachial plexus block   Pre-Anesthetic Checklist: ,, timeout performed, Correct Patient, Correct Site, Correct Laterality, Correct Procedure, Correct Position, site marked, Risks and benefits discussed,  Surgical consent,  Pre-op evaluation,  At surgeon's request and post-op pain management  Laterality: Upper and Left  Prep: chloraprep       Needles:  Injection technique: Single-shot  Needle Type: Stimiplex     Needle Length: 5cm  Needle Gauge: 22     Additional Needles:   Procedures:,,,, ultrasound used (permanent image in chart),,,,  Narrative:  Start time: 09/20/2018 9:10 AM End time: 09/20/2018 9:14 AM Injection made incrementally with aspirations every 5 mL.  Performed by: Personally  Anesthesiologist: Josealberto Montalto, Precious Haws, MD  Additional Notes: Functioning IV was confirmed and monitors were applied.  A 90mm 22ga Stimuplex needle was used. Sterile prep,hand hygiene and sterile gloves were used.  Minimal sedation used for procedure.  No paresthesia endorsed by patient during the procedure.  Negative aspiration and negative test dose prior to incremental administration of local anesthetic. The patient tolerated the procedure well with no immediate complications.

## 2018-09-20 NOTE — Transfer of Care (Signed)
Immediate Anesthesia Transfer of Care Note  Patient: Ashley Gross  Procedure(s) Performed: SHOULDER ARTHROSCOPY WITH OPEN ROTATOR CUFF REPAIR (Left Shoulder)  Patient Location: PACU  Anesthesia Type:General  Level of Consciousness: awake, alert , oriented and patient cooperative  Airway & Oxygen Therapy: Patient Spontanous Breathing and Patient connected to nasal cannula oxygen  Post-op Assessment: Report given to RN and Post -op Vital signs reviewed and stable  Post vital signs: Reviewed and stable  Last Vitals:  Vitals Value Taken Time  BP 106/57 09/20/2018 12:26 PM  Temp    Pulse 89 09/20/2018 12:27 PM  Resp 27 09/20/2018 12:27 PM  SpO2 99 % 09/20/2018 12:27 PM  Vitals shown include unvalidated device data.  Last Pain:  Vitals:   09/20/18 0817  TempSrc: Temporal  PainSc: 0-No pain         Complications: No apparent anesthesia complications

## 2018-09-20 NOTE — Progress Notes (Signed)
Patient having complaints of shortness of breath, informed Dr. Amie Critchley; ordered a duoneb and at bedside with patient. No concerns at this time, send patient home if O2 sats on room air above 92.

## 2018-09-20 NOTE — Anesthesia Postprocedure Evaluation (Signed)
Anesthesia Post Note  Patient: Ashley Gross  Procedure(s) Performed: SHOULDER ARTHROSCOPY WITH OPEN ROTATOR CUFF REPAIR (Left Shoulder)  Patient location during evaluation: PACU Anesthesia Type: General Level of consciousness: awake and alert Pain management: pain level controlled Vital Signs Assessment: post-procedure vital signs reviewed and stable Respiratory status: spontaneous breathing, nonlabored ventilation, respiratory function stable and patient connected to nasal cannula oxygen Cardiovascular status: blood pressure returned to baseline and stable Postop Assessment: no apparent nausea or vomiting Anesthetic complications: no     Last Vitals:  Vitals:   09/20/18 1305 09/20/18 1311  BP:  (!) 102/55  Pulse:  85  Resp:  10  Temp:    SpO2: 100% 97%    Last Pain:  Vitals:   09/20/18 1305  TempSrc:   PainSc: 3                  Precious Haws Jari Carollo

## 2018-09-20 NOTE — Anesthesia Post-op Follow-up Note (Signed)
Anesthesia QCDR form completed.        

## 2018-09-20 NOTE — H&P (Signed)
Paper H&P to be scanned into permanent record. H&P reviewed and patient re-examined. No changes. 

## 2018-09-20 NOTE — Discharge Instructions (Addendum)
Orthopedic discharge instructions: Keep dressing dry and intact.  May shower after dressing changed on post-op day #4 (Saturday).  Cover staples with Band-Aids after drying off. Apply ice frequently to shoulder. Take oxycodone as prescribed when needed.  May supplement with ES Tylenol if necessary. Keep shoulder immobilizer on at all times except may remove for bathing purposes. Follow-up in 10-14 days or as scheduled.    Interscalene Nerve Block, Care After This sheet gives you information about how to care for yourself after your procedure. Your health care provider may also give you more specific instructions. If you have problems or questions, contact your health care provider. What can I expect after the procedure? After the procedure, it is common to have:  Soreness or tenderness in your neck.  Numbness in your shoulder, upper arm, and some fingers.  Weakness in your shoulder and arm muscles.  The feeling and strength in your shoulder, arm, and fingers should return to normal within hours after your procedure. Follow these instructions at home: For at least 24 hours after the procedure:  Do not: ? Participate in activities in which you could fall or become injured. ? Drive. ? Use heavy machinery. ? Drink alcohol. ? Take sleeping pills or medicines that cause drowsiness. ? Make important decisions or sign legal documents. ? Take care of children on your own.  Rest. Eating and drinking  If you vomit, drink water, juice, or soup when you can drink without vomiting.  Make sure you have little or no nausea before eating solid foods.  Follow the diet that is recommended by your health care provider. If you have a sling:  Wear it as told by your health care provider. Remove it only as told by your health care provider.  Loosen the sling if your fingers tingle, become numb, or turn cold and blue.  Make sure that your entire arm, including your wrist, is supported. Do  not allow your wrist to dangle over the end of the sling.  Do not let your sling get wet if it is not waterproof.  Keep the sling clean. Bathing  Do not take baths, swim, or use a hot tub until your health care provider approves.  If you have a nerve block catheter in place, keep the incision site and tubing dry. Injection site care  Wash your hands with soap and water before you change your bandage (dressing). If soap and water are not available, use hand sanitizer.  Change your dressing as told by your health care provider.  Keep your dressing dry.  Check your nerve block injection site every day for signs of infection. Check for: ? Redness, swelling, or pain. ? Fluid or blood. ? Warmth. Activity  Do not perform complex or risky activities while taking prescription pain medicine and until you have fully recovered.  Return to your normal activities as told by your health care provider and as you can tolerate them. Ask your health care provider what activities are safe for you.  Rest and take it easy. This will help you heal and recover more quickly and fully.  Be very cautious until you have regained strength and sensation. General instructions  Have a responsible adult stay with you until you are awake and alert.  Do not drive or use heavy machinery while taking prescription pain medicine and until you have fully recovered. Ask your health care provider when it is safe to drive.  Take over-the-counter and prescription medicines only as told by your  health care provider.  If you smoke, do not smoke without supervision.  Do not expose your arm or shoulder to very cold or very hot temperatures until you have full feeling back.  If you have a nerve block catheter in place: ? Try to keep the catheter from getting kinked or pinched. ? Avoid pulling or tugging on the catheter.  Keep all follow-up visits as told by your health care provider. This is important. Contact a  health care provider if:  You have chills or fever.  You have redness, swelling, or pain around your injection site.  You have fluid or blood coming from the injection site.  The skin around the injection site is warm to the touch.  There is a bad smell coming from your dressing.  You have hoarseness or a drooping or dry eye that lasts more than a few days.  You have pain that is poorly controlled with the block or with pain medicine.  You have numbness, tingling, or weakness in your shoulder or arm that lasts for more than one week. Get help right away if:  You have severe pain.  You lose or do not regain strength and sensation in your arm even after the nerve block medicine has stopped.  You have trouble breathing.  You have a nerve block catheter still in place and you begin to shiver.  You have a nerve block catheter still in place and you are getting more and more numb or weak. This information is not intended to replace advice given to you by your health care provider. Make sure you discuss any questions you have with your health care provider. Document Released: 10/11/2015 Document Revised: 06/19/2016 Document Reviewed: 06/19/2016 Elsevier Interactive Patient Education  2018 [place]   1) The drugs that you were given will stay in your system until tomorrow so for the next 24 hours you should not:  A) Drive an automobile B) Make any legal decisions C) Drink any alcoholic beverage   2) You may resume regular meals tomorrow.  Today it is better to start with liquids and gradually work up to solid foods.  You may eat anything you prefer, but it is better to start with liquids, then soup and crackers, and gradually work up to solid foods.   3) Please notify your doctor immediately if you have any unusual bleeding, trouble breathing, redness and pain at the surgery site, drainage, fever, or pain not relieved by  medication.    4) Additional Instructions:        Please contact your physician with any problems or Same Day Surgery at [phone], Monday through Friday 6 am to 4 pm, or [place] at [facility] number at [phone].

## 2018-09-20 NOTE — Anesthesia Procedure Notes (Signed)
Procedure Name: Intubation Date/Time: 09/20/2018 10:38 AM Performed by: Bernardo Heater, CRNA Pre-anesthesia Checklist: Patient identified, Emergency Drugs available and Suction available Patient Re-evaluated:Patient Re-evaluated prior to induction Oxygen Delivery Method: Circle system utilized Preoxygenation: Pre-oxygenation with 100% oxygen Induction Type: IV induction Laryngoscope Size: Mac and 3 Grade View: Grade I Tube size: 7.0 mm Number of attempts: 1 Placement Confirmation: ETT inserted through vocal cords under direct vision,  positive ETCO2 and breath sounds checked- equal and bilateral Secured at: 21 cm Tube secured with: Tape Dental Injury: Teeth and Oropharynx as per pre-operative assessment

## 2018-09-20 NOTE — Op Note (Signed)
09/20/2018  12:28 PM  Patient:   Ashley Gross  Pre-Op Diagnosis:   Impingement/tendinopathy with partial-thickness rotator cuff tear, left shoulder.  Post-Op Diagnosis:   Impingement/tendinopathy with full-thickness rotator cuff tear, degenerative joint disease, and biceps tendinopathy, left shoulder.  Procedure:   Extensive arthroscopic debridement with abrasion chondroplasty of grade 3 chondromalacial changes of humeral head, arthroscopic subacromial decompression, mini-open rotator cuff repair, and mini-open biceps tenodesis, left shoulder.  Anesthesia:   General endotracheal with interscalene block placed preoperatively by the anesthesiologist.  Surgeon:   Pascal Lux, MD  Assistant:   Phoebe Sharps, PA-S  Findings:   As above. There was mild labral fraying superiorly without frank detachment of the labrum from the glenoid. There was a focal near full-thickness bursal surface tear involving the anterior insertional fibers of the supra spinatus tendon. The remainder of the rotator cuff was in excellent condition. There were focal grade 3 chondromalacial changes involving the mid-articular surface of the humeral head. The articular surface of the glenoid was in satisfactory condition. The biceps tendon demonstrated moderate inflammation involving the intra-articular portion without fraying or partial tearing.  Complications:   None  Fluids:   1200 cc  Estimated blood loss:   5 cc  Tourniquet time:   None  Drains:   None  Closure:   Staples      Brief clinical note:   The patient is a 67 year old female with a history of left shoulder pain. The patient's symptoms have progressed despite medications, activity modification, etc. The patient's history and examination are consistent with impingement/tendinopathy with a rotator cuff tear. These findings were confirmed by MRI scan. The patient presents at this time for definitive management of these shoulder  symptoms.  Procedure:   The patient underwent placement of an interscalene block by the anesthesiologist in the preoperative holding area before being brought into the operating room and lain in the supine position. The patient then underwent general endotracheal intubation and anesthesia before being repositioned in the beach chair position using the beach chair positioner. The left shoulder and upper extremity were prepped with ChloraPrep solution before being draped sterilely. Preoperative antibiotics were administered. A timeout was performed to confirm the proper surgical site before the expected portal sites and incision site were injected with 0.5% Sensorcaine with epinephrine. A posterior portal was created and the glenohumeral joint thoroughly inspected with the findings as described above. An anterior portal was created using an outside-in technique. The labrum and rotator cuff were further probed, again confirming the above-noted findings. The areas of labral fraying, synovitis, and loose humeral articular cartilage each were debrided back to stable margins using the full-radius resector. The ArthroCare wand was inserted and used to release the biceps from its labral anchor.  It also was used to obtain hemostasis as well as to "anneal" the labrum superiorly and anteriorly. The instruments were removed from the joint after suctioning the excess fluid.  The camera was repositioned through the posterior portal into the subacromial space. A separate lateral portal was created using an outside-in technique. The 3.5 mm full-radius resector was introduced and used to perform a subtotal bursectomy. The ArthroCare wand was then inserted and used to remove the periosteal tissue off the undersurface of the anterior third of the acromion as well as to recess the coracoacromial ligament from its attachment along the anterior and lateral margins of the acromion. The 4.0 mm acromionizing bur was introduced and used  to complete the decompression by removing the undersurface of  the anterior third of the acromion. The full radius resector was reintroduced to remove any residual bony debris before the ArthroCare wand was reintroduced to obtain hemostasis. The instruments were then removed from the subacromial space after suctioning the excess fluid.  An approximately 4-5 cm incision was made over the anterolateral aspect of the shoulder beginning at the anterolateral corner of the acromion and extending distally in line with the bicipital groove. This incision was carried down through the subcutaneous tissues to expose the deltoid fascia. The raphae between the anterior and middle thirds was identified and this plane developed to provide access into the subacromial space. Additional bursal tissues were debrided sharply using Metzenbaum scissors. The rotator cuff was carefully inspected. A focal bursal surface tear involving the anterior insertional fibers of the supraspinatus tendon was identified. The tear was carefully completed with a #15 blade and the exposed greater tuberosity roughened with a rongeur. The tear was repaired using a single Biomet 2.9 mm JuggerKnot anchor. These sutures were then brought back laterally and secured using one [place] QuatroLink anchor to create a two-layer closure. An apparent watertight closure was obtained.  The bicipital groove was identified by palpation and opened for 1-1.5 cm. The biceps tendon stump was retrieved through this defect. The floor of the bicipital groove was roughened with a curet before another Biomet 2.9 mm JuggerKnot anchor was inserted. Both sets of sutures were passed through the biceps tendon and tied securely to effect the tenodesis. The bicipital sheath was reapproximated using two #0 Ethibond interrupted sutures, incorporating the biceps tendon to further reinforce the tenodesis.  The wound was copiously irrigated with sterile saline solution before the deltoid  raphae was reapproximated using 2-0 Vicryl interrupted sutures. The subcutaneous tissues were closed in two layers using 2-0 Vicryl interrupted sutures before the skin was closed using staples. The portal sites also were closed using staples. A sterile bulky dressing was applied to the shoulder before the arm was placed into a shoulder immobilizer. The patient was then awakened, extubated, and returned to the recovery room in satisfactory condition after tolerating the procedure well.

## 2018-09-21 ENCOUNTER — Encounter: Payer: Self-pay | Admitting: Surgery

## 2018-09-26 ENCOUNTER — Ambulatory Visit: Payer: PPO | Admitting: Pharmacist

## 2018-09-26 DIAGNOSIS — M25612 Stiffness of left shoulder, not elsewhere classified: Secondary | ICD-10-CM | POA: Diagnosis not present

## 2018-09-26 DIAGNOSIS — M6281 Muscle weakness (generalized): Secondary | ICD-10-CM | POA: Diagnosis not present

## 2018-09-26 DIAGNOSIS — Z9889 Other specified postprocedural states: Secondary | ICD-10-CM | POA: Diagnosis not present

## 2018-09-26 DIAGNOSIS — M25512 Pain in left shoulder: Secondary | ICD-10-CM | POA: Diagnosis not present

## 2018-10-03 DIAGNOSIS — M25512 Pain in left shoulder: Secondary | ICD-10-CM | POA: Diagnosis not present

## 2018-10-04 ENCOUNTER — Ambulatory Visit (INDEPENDENT_AMBULATORY_CARE_PROVIDER_SITE_OTHER): Payer: PPO

## 2018-10-04 DIAGNOSIS — D518 Other vitamin B12 deficiency anemias: Secondary | ICD-10-CM

## 2018-10-04 MED ORDER — CYANOCOBALAMIN 1000 MCG/ML IJ SOLN
1000.0000 ug | Freq: Once | INTRAMUSCULAR | Status: AC
  Administered 2018-10-04: 1000 ug via INTRAMUSCULAR

## 2018-10-04 NOTE — Progress Notes (Signed)
Pt was seen today for NV for B-12 shot. Shot was given IM in RD as request by patient due to left arm being in a brace. Pt tolerated well.

## 2018-10-07 DIAGNOSIS — E119 Type 2 diabetes mellitus without complications: Secondary | ICD-10-CM | POA: Diagnosis not present

## 2018-10-07 DIAGNOSIS — H02132 Senile ectropion of right lower eyelid: Secondary | ICD-10-CM | POA: Diagnosis not present

## 2018-10-07 DIAGNOSIS — Z7951 Long term (current) use of inhaled steroids: Secondary | ICD-10-CM | POA: Diagnosis not present

## 2018-10-07 DIAGNOSIS — H02135 Senile ectropion of left lower eyelid: Secondary | ICD-10-CM | POA: Diagnosis not present

## 2018-10-07 DIAGNOSIS — K219 Gastro-esophageal reflux disease without esophagitis: Secondary | ICD-10-CM | POA: Diagnosis not present

## 2018-10-07 DIAGNOSIS — H02102 Unspecified ectropion of right lower eyelid: Secondary | ICD-10-CM | POA: Diagnosis not present

## 2018-10-07 DIAGNOSIS — Z79899 Other long term (current) drug therapy: Secondary | ICD-10-CM | POA: Diagnosis not present

## 2018-10-07 DIAGNOSIS — E785 Hyperlipidemia, unspecified: Secondary | ICD-10-CM | POA: Diagnosis not present

## 2018-10-07 DIAGNOSIS — J45909 Unspecified asthma, uncomplicated: Secondary | ICD-10-CM | POA: Diagnosis not present

## 2018-10-07 DIAGNOSIS — F329 Major depressive disorder, single episode, unspecified: Secondary | ICD-10-CM | POA: Diagnosis not present

## 2018-10-07 DIAGNOSIS — I10 Essential (primary) hypertension: Secondary | ICD-10-CM | POA: Diagnosis not present

## 2018-10-07 DIAGNOSIS — H02105 Unspecified ectropion of left lower eyelid: Secondary | ICD-10-CM | POA: Diagnosis not present

## 2018-10-09 ENCOUNTER — Other Ambulatory Visit: Payer: Self-pay | Admitting: Internal Medicine

## 2018-10-10 DIAGNOSIS — Z9889 Other specified postprocedural states: Secondary | ICD-10-CM | POA: Diagnosis not present

## 2018-10-10 DIAGNOSIS — M25612 Stiffness of left shoulder, not elsewhere classified: Secondary | ICD-10-CM | POA: Diagnosis not present

## 2018-10-10 DIAGNOSIS — M6281 Muscle weakness (generalized): Secondary | ICD-10-CM | POA: Diagnosis not present

## 2018-10-11 ENCOUNTER — Telehealth: Payer: Self-pay | Admitting: Internal Medicine

## 2018-10-11 NOTE — Telephone Encounter (Signed)
Copied from [place] [id number]. Topic: Quick Communication - See Telephone Encounter >> Oct 11, 2018 11:29 AM Bea Graff, NT wrote: CRM for notification. See Telephone encounter for: 10/11/18. Pt states she is needing her glipiZIDE (GLUCOTROL) 5 MG tablet changed from a ER to a regular glipizide. She states she has a few days left of the ER but is needing the new rx sent in.  [facility] [place], [place] [facility] [address] Shari Prows [phone] (Phone) [phone] (Fax)

## 2018-10-12 ENCOUNTER — Other Ambulatory Visit: Payer: Self-pay

## 2018-10-12 DIAGNOSIS — E1121 Type 2 diabetes mellitus with diabetic nephropathy: Secondary | ICD-10-CM

## 2018-10-12 MED ORDER — GLIPIZIDE 5 MG PO TABS: 5.0000 mg | ORAL_TABLET | Freq: Every day | ORAL | 1 refills | Status: DC

## 2018-10-12 NOTE — Telephone Encounter (Signed)
rx sent in. Discontinued glucotrol xl

## 2018-10-18 DIAGNOSIS — M25612 Stiffness of left shoulder, not elsewhere classified: Secondary | ICD-10-CM | POA: Diagnosis not present

## 2018-10-18 DIAGNOSIS — M6281 Muscle weakness (generalized): Secondary | ICD-10-CM | POA: Diagnosis not present

## 2018-10-18 DIAGNOSIS — M25512 Pain in left shoulder: Secondary | ICD-10-CM | POA: Diagnosis not present

## 2018-10-18 DIAGNOSIS — Z9889 Other specified postprocedural states: Secondary | ICD-10-CM | POA: Diagnosis not present

## 2018-10-19 ENCOUNTER — Telehealth: Payer: Self-pay | Admitting: *Deleted

## 2018-10-19 NOTE — Telephone Encounter (Signed)
Copied from [place] [id number]. Topic: General - Other >> Oct 19, 2018  9:04 AM Carolyn Stare wrote:  RX was written for 10 units and she increased every 4 days and now she is taking 20 units and need a new RX that say 20 units    nsulin degludec (TRESIBA FLEXTOUCH) 100 UNIT/ML SOPN FlexTouch Pen   [facility]

## 2018-10-20 ENCOUNTER — Other Ambulatory Visit: Payer: Self-pay | Admitting: Internal Medicine

## 2018-10-20 NOTE — Telephone Encounter (Signed)
LMTCB

## 2018-10-21 ENCOUNTER — Other Ambulatory Visit: Payer: Self-pay

## 2018-10-21 DIAGNOSIS — E1121 Type 2 diabetes mellitus with diabetic nephropathy: Secondary | ICD-10-CM

## 2018-10-21 MED ORDER — POTASSIUM CHLORIDE ER 10 MEQ PO TBCR: EXTENDED_RELEASE_TABLET | ORAL | 1 refills | Status: DC

## 2018-10-21 MED ORDER — INSULIN DEGLUDEC 100 UNIT/ML ~~LOC~~ SOPN: 20.0000 [IU] | PEN_INJECTOR | Freq: Every day | SUBCUTANEOUS | 2 refills | Status: DC

## 2018-10-21 NOTE — Telephone Encounter (Signed)
Rx sent in for Tresiba and Potassium

## 2018-10-21 NOTE — Telephone Encounter (Signed)
Patient is returning [place] call. She states to contact her cell phone if can not reach on home number.   Cell# [phone]

## 2018-10-27 DIAGNOSIS — M25512 Pain in left shoulder: Secondary | ICD-10-CM | POA: Diagnosis not present

## 2018-10-27 DIAGNOSIS — M25612 Stiffness of left shoulder, not elsewhere classified: Secondary | ICD-10-CM | POA: Diagnosis not present

## 2018-11-01 ENCOUNTER — Ambulatory Visit (INDEPENDENT_AMBULATORY_CARE_PROVIDER_SITE_OTHER): Payer: PPO | Admitting: Internal Medicine

## 2018-11-01 ENCOUNTER — Encounter: Payer: Self-pay | Admitting: Internal Medicine

## 2018-11-01 VITALS — BP 118/66 | HR 83 | Temp 98.9°F | Ht 64.0 in | Wt 186.2 lb

## 2018-11-01 DIAGNOSIS — R945 Abnormal results of liver function studies: Secondary | ICD-10-CM

## 2018-11-01 DIAGNOSIS — E7800 Pure hypercholesterolemia, unspecified: Secondary | ICD-10-CM | POA: Diagnosis not present

## 2018-11-01 DIAGNOSIS — F439 Reaction to severe stress, unspecified: Secondary | ICD-10-CM | POA: Diagnosis not present

## 2018-11-01 DIAGNOSIS — L4050 Arthropathic psoriasis, unspecified: Secondary | ICD-10-CM | POA: Diagnosis not present

## 2018-11-01 DIAGNOSIS — E1121 Type 2 diabetes mellitus with diabetic nephropathy: Secondary | ICD-10-CM

## 2018-11-01 DIAGNOSIS — K529 Noninfective gastroenteritis and colitis, unspecified: Secondary | ICD-10-CM | POA: Diagnosis not present

## 2018-11-01 DIAGNOSIS — M810 Age-related osteoporosis without current pathological fracture: Secondary | ICD-10-CM | POA: Diagnosis not present

## 2018-11-01 DIAGNOSIS — E559 Vitamin D deficiency, unspecified: Secondary | ICD-10-CM | POA: Diagnosis not present

## 2018-11-01 DIAGNOSIS — I10 Essential (primary) hypertension: Secondary | ICD-10-CM | POA: Diagnosis not present

## 2018-11-01 DIAGNOSIS — D518 Other vitamin B12 deficiency anemias: Secondary | ICD-10-CM

## 2018-11-01 DIAGNOSIS — R7989 Other specified abnormal findings of blood chemistry: Secondary | ICD-10-CM

## 2018-11-01 DIAGNOSIS — K219 Gastro-esophageal reflux disease without esophagitis: Secondary | ICD-10-CM

## 2018-11-01 NOTE — Progress Notes (Signed)
Pre visit review using our clinic review tool, if applicable. No additional management support is needed unless otherwise documented below in the visit note. 

## 2018-11-01 NOTE — Progress Notes (Signed)
Patient ID: Ashley Gross, female   DOB: [birth year], 67 y.o.   MRN: [id number]   Subjective:    Patient ID: Ashley Gross, female    DOB: 04/10/1951, 67 y.o.   MRN: [id number]  HPI  Patient here for a scheduled follow up.  She is s/p shoulder surgery (left).  Still going to therapy.  Still with increased pain and limited rom.  She also recently had eye surgery - bilateral.  Has f/u with ophthalmology next week.  F/u with ortho this week.  No chest pain.  Breathing stable.  No acid reflux.  No abdominal pain.  Still issues with her bowels.  Taking imodium.  States sugars have been doing well.  Reports morning sugars averaging 80-90s and 2 hours after eating (pm) - 140s.  Increased stress with her husband's health issues and her health issues.  Discussed with her today.  Overall she feels she is handling things relatively well.     Past Medical History:  Diagnosis Date  . Anemia, unspecified   . Asthma   . Colitis   . Diabetes mellitus ([place])   . Esophagitis   . H/O hiatal hernia   . Heart murmur   . Hx of arteriovenous malformation (AVM)   . Hypertension   . IBS (irritable bowel syndrome)   . Nephrolithiasis   . Pernicious anemia   . Pneumonia   . Psoriatic arthritis ([facility])    s/p penicillamine, plaquenil, MTX, sulfasalazine.  s/p Embrel, Humira,.  Iritis.    . Pure hypercholesterolemia    Past Surgical History:  Procedure Laterality Date  . ABDOMINAL HYSTERECTOMY  1981   ovaries left in place  . ANKLE SURGERY     right  . BUNIONECTOMY    . CHOLECYSTECTOMY  1989  . COLONOSCOPY WITH PROPOFOL N/A 12/27/2017   Procedure: COLONOSCOPY WITH PROPOFOL;  Surgeon: Lin Landsman, MD;  Location: [facility] ENDOSCOPY;  Service: Gastroenterology;  Laterality: N/A;  . ESOPHAGOGASTRODUODENOSCOPY (EGD) WITH PROPOFOL N/A 12/27/2017   Procedure: ESOPHAGOGASTRODUODENOSCOPY (EGD) WITH PROPOFOL;  Surgeon: Lin Landsman, MD;  Location: [facility] ENDOSCOPY;  Service: Gastroenterology;  Laterality:  N/A;  . HAND SURGERY     right  . HEEL SPUR SURGERY    . NOSE SURGERY     x2  . SHOULDER ARTHROSCOPY WITH OPEN ROTATOR CUFF REPAIR Left 09/20/2018   Procedure: SHOULDER ARTHROSCOPY WITH OPEN ROTATOR CUFF REPAIR;  Surgeon: Corky Mull, MD;  Location: [facility] ORS;  Service: Orthopedics;  Laterality: Left;  . UMBILICAL HERNIA REPAIR     Family History  Problem Relation Age of Onset  . Diabetes Other   . Hypertension Other   . Breast cancer Neg Hx   . Colon cancer Neg Hx    Social History   Socioeconomic History  . Marital status: Married    Spouse name: Not on file  . Number of children: 2  . Years of education: Not on file  . Highest education level: Not on file  Occupational History  . Not on file  Social Needs  . Financial resource strain: Not on file  . Food insecurity:    Worry: Not on file    Inability: Not on file  . Transportation needs:    Medical: Not on file    Non-medical: Not on file  Tobacco Use  . Smoking status: Never Smoker  . Smokeless tobacco: Never Used  Substance and Sexual Activity  . Alcohol use: No    Alcohol/week: 0.0 standard drinks  .  Drug use: No  . Sexual activity: Not Currently  Lifestyle  . Physical activity:    Days per week: Not on file    Minutes per session: Not on file  . Stress: Not on file  Relationships  . Social connections:    Talks on phone: Not on file    Gets together: Not on file    Attends religious service: Not on file    Active member of club or organization: Not on file    Attends meetings of clubs or organizations: Not on file    Relationship status: Not on file  Other Topics Concern  . Not on file  Social History Narrative   She is married and has two children    Outpatient Encounter Medications as of 11/01/2018  Medication Sig  . albuterol (ACCUNEB) 0.63 MG/3ML nebulizer solution Take 3 mLs (0.63 mg total) by nebulization every 6 (six) hours as needed for wheezing.  Marland Kitchen amLODipine (NORVASC) 5 MG tablet TAKE  1 TABLET BY MOUTH TWICE DAILY  . Calcium Carbonate-Vit D-Min (CALCIUM 600+D PLUS MINERALS) 600-400 MG-UNIT TABS Take by mouth.  . Cholecalciferol (VITAMIN D-3) 1000 UNITS CAPS Take 1 capsule by mouth daily.  . citalopram (CELEXA) 10 MG tablet TAKE 1 TABLET BY MOUTH ONCE DAILY  . diphenhydrAMINE (BENADRYL) 25 mg capsule Take by mouth.  . DOCOSAHEXAENOIC ACID PO Take by mouth.  . Fluticasone-Salmeterol (ADVAIR DISKUS) 250-50 MCG/DOSE AEPB Inhale 1 puff into the lungs 2 (two) times daily.  Marland Kitchen glipiZIDE (GLUCOTROL) 5 MG tablet Take 1 tablet (5 mg total) by mouth daily.  . insulin degludec (TRESIBA FLEXTOUCH) 100 UNIT/ML SOPN FlexTouch Pen Inject 0.2 mLs (20 Units total) into the skin daily.  . Insulin Pen Needle (BD PEN NEEDLE NANO U/F) 32G X 4 MM MISC Use to inject insulin daily  . ipratropium (ATROVENT HFA) 17 MCG/ACT inhaler Inhale 2 puffs into the lungs every 6 (six) hours.  Marland Kitchen loperamide (IMODIUM) 2 MG capsule Take by mouth.  . losartan (COZAAR) 100 MG tablet TAKE 1 TABLET(100 MG) BY MOUTH DAILY  . omeprazole (PRILOSEC) 20 MG capsule TAKE 1 CAPSULE BY MOUTH ONCE DAILY  . ONE TOUCH ULTRA TEST test strip PATIENT NEEDS NEW METER STRIPS AND LANCETS FOR ONE TOUCH. HER INSURANCE NO LONGER COVERS ACCUCHEK.  Marland Kitchen oxyCODONE (ROXICODONE) 5 MG immediate release tablet Take 1-2 tablets (5-10 mg total) by mouth every 4 (four) hours as needed for moderate pain or severe pain.  . pioglitazone (ACTOS) 15 MG tablet TAKE 1 TABLET BY MOUTH ONCE DAILY  . potassium chloride (K-DUR) 10 MEQ tablet TAKE 1 TABLET(10 MEQ) BY MOUTH TWICE DAILY  . rosuvastatin (CRESTOR) 10 MG tablet Take 1 tablet (10 mg total) by mouth daily.  Marland Kitchen triamterene-hydrochlorothiazide (MAXZIDE-25) 37.5-25 MG tablet Take 0.5 tablets by mouth daily.  . Vitamin D, Ergocalciferol, (DRISDOL) 50000 units CAPS capsule Take 1 capsule (50,000 Units total) by mouth every 7 (seven) days.  Marland Kitchen dicyclomine (BENTYL) 20 MG tablet Take 1 tablet (20 mg total) by mouth  every 6 (six) hours.   No facility-administered encounter medications on file as of 11/01/2018.     Review of Systems  Constitutional: Negative for appetite change and unexpected weight change.  HENT: Negative for congestion and sinus pressure.   Respiratory: Negative for cough, chest tightness and shortness of breath.   Cardiovascular: Negative for chest pain, palpitations and leg swelling.  Gastrointestinal: Negative for abdominal pain, diarrhea, nausea and vomiting.  Genitourinary: Negative for difficulty urinating and dysuria.  Musculoskeletal: Negative for myalgias.       Shoulder pain as outlined.    Skin: Negative for color change and rash.  Neurological: Negative for dizziness, light-headedness and headaches.  Psychiatric/Behavioral: Negative for agitation and dysphoric mood.       Objective:    Physical Exam Constitutional:      General: She is not in acute distress.    Appearance: Normal appearance.  HENT:     Nose: Nose normal. No congestion.     Mouth/Throat:     Pharynx: No oropharyngeal exudate or posterior oropharyngeal erythema.  Neck:     Musculoskeletal: Neck supple. No muscular tenderness.     Thyroid: No thyromegaly.  Cardiovascular:     Rate and Rhythm: Normal rate and regular rhythm.  Pulmonary:     Effort: No respiratory distress.     Breath sounds: Normal breath sounds. No wheezing.  Abdominal:     General: Bowel sounds are normal.     Palpations: Abdomen is soft.     Tenderness: There is no abdominal tenderness.  Musculoskeletal:        General: No swelling or tenderness.  Lymphadenopathy:     Cervical: No cervical adenopathy.  Skin:    Findings: No erythema or rash.  Neurological:     Mental Status: She is alert.  Psychiatric:        Mood and Affect: Mood normal.        Behavior: Behavior normal.     BP 118/66   Pulse 83   Temp 98.9 F (37.2 C) (Oral)   Ht '5\' 4"'  (1.626 m)   Wt 186 lb 3.2 oz (84.5 kg)   SpO2 97%   BMI 31.96 kg/m   Wt Readings from Last 3 Encounters:  11/01/18 186 lb 3.2 oz (84.5 kg)  09/20/18 183 lb 6.8 oz (83.2 kg)  09/16/18 183 lb 8 oz (83.2 kg)     Lab Results  Component Value Date   WBC 9.2 07/15/2018   HGB 12.2 07/15/2018   HCT 37.6 07/15/2018   PLT 250.0 07/15/2018   GLUCOSE 173 (H) 11/01/2018   CHOL 130 11/01/2018   TRIG 134 11/01/2018   HDL 52 11/01/2018   LDLDIRECT 120.0 12/06/2017   LDLCALC 56 11/01/2018   ALT 12 11/01/2018   AST 17 11/01/2018   NA 140 11/01/2018   K 3.7 11/01/2018   CL 107 11/01/2018   CREATININE 1.00 (H) 11/01/2018   BUN 18 11/01/2018   CO2 22 11/01/2018   TSH 2.83 06/21/2018   HGBA1C 7.1 (H) 11/01/2018   MICROALBUR 0.9 06/21/2018       Assessment & Plan:   Problem List Items Addressed This Visit    Abnormal liver function test    Follow liver function tests.        Anemia    Follow cbc.       Chronic diarrhea    Being followed by GI.  Planning for colonoscopy once recovered from surgery.        Diabetes mellitus ([place])    Sugars as outlined.  Doing better.  [name] - doing well.  Follow met b and a1c.        Relevant Orders   Hemoglobin A1c (Completed)   Basic metabolic panel (Completed)   GERD (gastroesophageal reflux disease)    Upper symptoms controlled.        Hypercholesterolemia - Primary    On crestor.  Low cholesterol diet and exercise.  Follow lipid panel and liver  function tests.        Relevant Orders   Lipid panel (Completed)   Hepatic function panel (Completed)   Hypertension    Blood pressure under good control.  Continue same medication regimen.  Follow pressures.  Follow metabolic panel.        Osteoporosis    Reclast.  Follow vitamin D level.        Psoriatic arthritis ([place])    Followed by Dr Jefm Bryant.  Stable.        Stress    Increased stress as outlined.  Discussed with her today.  She does not feel she needs anything more at this time.  Follow.        Vitamin D deficiency    Follow vitamin D  level.        Relevant Orders   VITAMIN D 25 Hydroxy (Vit-D Deficiency, Fractures) (Completed)       Einar Pheasant, MD

## 2018-11-02 ENCOUNTER — Encounter: Payer: Self-pay | Admitting: Internal Medicine

## 2018-11-02 LAB — LIPID PANEL
CHOLESTEROL: 130 mg/dL (ref <200)
HDL: 52 mg/dL (ref >50)
LDL Cholesterol (Calc): 56 mg/dL (calc)
NON-HDL CHOLESTEROL (CALC): 78 mg/dL (ref <130)
TRIGLYCERIDES: 134 mg/dL (ref <150)
Total CHOL/HDL Ratio: 2.5 (calc) (ref <5.0)

## 2018-11-02 LAB — HEMOGLOBIN A1C
EAG (MMOL/L): 8.7 (calc)
Hgb A1c MFr Bld: 7.1 % of total Hgb — ABNORMAL HIGH (ref <5.7)
Mean Plasma Glucose: 157 (calc)

## 2018-11-02 LAB — HEPATIC FUNCTION PANEL
AG RATIO: 1.5 (calc) (ref 1.0–2.5)
ALBUMIN MSPROF: 3.5 g/dL — AB (ref 3.6–5.1)
ALT: 12 U/L (ref 6–29)
AST: 17 U/L (ref 10–35)
Alkaline phosphatase (APISO): 61 U/L (ref 33–130)
BILIRUBIN DIRECT: 0.1 mg/dL (ref 0.0–0.2)
BILIRUBIN INDIRECT: 0.2 mg/dL (ref 0.2–1.2)
BILIRUBIN TOTAL: 0.3 mg/dL (ref 0.2–1.2)
GLOBULIN: 2.3 g/dL (ref 1.9–3.7)
Total Protein: 5.8 g/dL — ABNORMAL LOW (ref 6.1–8.1)

## 2018-11-02 LAB — BASIC METABOLIC PANEL
BUN / CREAT RATIO: 18 (calc) (ref 6–22)
BUN: 18 mg/dL (ref 7–25)
CALCIUM: 8.3 mg/dL — AB (ref 8.6–10.4)
CHLORIDE: 107 mmol/L (ref 98–110)
CO2: 22 mmol/L (ref 20–32)
Creat: 1 mg/dL — ABNORMAL HIGH (ref 0.50–0.99)
Glucose, Bld: 173 mg/dL — ABNORMAL HIGH (ref 65–99)
POTASSIUM: 3.7 mmol/L (ref 3.5–5.3)
SODIUM: 140 mmol/L (ref 135–146)

## 2018-11-02 LAB — VITAMIN D 25 HYDROXY (VIT D DEFICIENCY, FRACTURES): Vit D, 25-Hydroxy: 24 ng/mL — ABNORMAL LOW (ref 30–100)

## 2018-11-02 NOTE — Assessment & Plan Note (Signed)
Reclast.  Follow vitamin D level.

## 2018-11-02 NOTE — Assessment & Plan Note (Signed)
Upper symptoms controlled.  

## 2018-11-02 NOTE — Assessment & Plan Note (Signed)
Followed by Dr Kernodle.  Stable.  

## 2018-11-02 NOTE — Assessment & Plan Note (Signed)
Increased stress as outlined.  Discussed with her today.  She does not feel she needs anything more at this time.  Follow.

## 2018-11-02 NOTE — Assessment & Plan Note (Signed)
Follow vitamin D level.  

## 2018-11-02 NOTE — Assessment & Plan Note (Signed)
Sugars as outlined.  Doing better.  Ashley Gross - doing well.  Follow met b and a1c.

## 2018-11-02 NOTE — Assessment & Plan Note (Signed)
Blood pressure under good control.  Continue same medication regimen.  Follow pressures.  Follow metabolic panel.   

## 2018-11-02 NOTE — Assessment & Plan Note (Signed)
Follow cbc.  

## 2018-11-02 NOTE — Assessment & Plan Note (Signed)
Follow liver function tests.   

## 2018-11-02 NOTE — Assessment & Plan Note (Signed)
On crestor.  Low cholesterol diet and exercise.  Follow lipid panel and liver function tests.   

## 2018-11-02 NOTE — Assessment & Plan Note (Signed)
Being followed by GI.  Planning for colonoscopy once recovered from surgery.

## 2018-11-03 DIAGNOSIS — M6281 Muscle weakness (generalized): Secondary | ICD-10-CM | POA: Diagnosis not present

## 2018-11-03 DIAGNOSIS — Z9889 Other specified postprocedural states: Secondary | ICD-10-CM | POA: Diagnosis not present

## 2018-11-03 DIAGNOSIS — M25612 Stiffness of left shoulder, not elsewhere classified: Secondary | ICD-10-CM | POA: Diagnosis not present

## 2018-11-03 DIAGNOSIS — M25512 Pain in left shoulder: Secondary | ICD-10-CM | POA: Diagnosis not present

## 2018-11-07 ENCOUNTER — Other Ambulatory Visit: Payer: Self-pay | Admitting: Internal Medicine

## 2018-11-07 DIAGNOSIS — M25512 Pain in left shoulder: Secondary | ICD-10-CM | POA: Diagnosis not present

## 2018-11-08 ENCOUNTER — Ambulatory Visit (INDEPENDENT_AMBULATORY_CARE_PROVIDER_SITE_OTHER): Payer: PPO

## 2018-11-08 ENCOUNTER — Ambulatory Visit: Payer: Self-pay

## 2018-11-08 DIAGNOSIS — D518 Other vitamin B12 deficiency anemias: Secondary | ICD-10-CM | POA: Diagnosis not present

## 2018-11-08 MED ORDER — CYANOCOBALAMIN 1000 MCG/ML IJ SOLN
1000.0000 ug | Freq: Once | INTRAMUSCULAR | Status: AC
  Administered 2018-11-08: 1000 ug via INTRAMUSCULAR

## 2018-11-08 NOTE — Progress Notes (Addendum)
Patient presents for monthly B12 injection per doctor's order. Administered RD per patient request; notes recent surgery to L arm.  No grimacing or verbal complaint during or post receiving. Schedule next injection as directed.   Reviewed.  Dr Nicki Reaper

## 2018-11-11 DIAGNOSIS — M6281 Muscle weakness (generalized): Secondary | ICD-10-CM | POA: Diagnosis not present

## 2018-11-11 DIAGNOSIS — M25512 Pain in left shoulder: Secondary | ICD-10-CM | POA: Diagnosis not present

## 2018-11-11 DIAGNOSIS — M25612 Stiffness of left shoulder, not elsewhere classified: Secondary | ICD-10-CM | POA: Diagnosis not present

## 2018-11-11 DIAGNOSIS — Z9889 Other specified postprocedural states: Secondary | ICD-10-CM | POA: Diagnosis not present

## 2018-11-13 ENCOUNTER — Other Ambulatory Visit: Payer: Self-pay | Admitting: Internal Medicine

## 2018-11-14 DIAGNOSIS — Z9889 Other specified postprocedural states: Secondary | ICD-10-CM | POA: Diagnosis not present

## 2018-11-14 DIAGNOSIS — M6281 Muscle weakness (generalized): Secondary | ICD-10-CM | POA: Diagnosis not present

## 2018-11-14 DIAGNOSIS — M25512 Pain in left shoulder: Secondary | ICD-10-CM | POA: Diagnosis not present

## 2018-11-14 DIAGNOSIS — M25612 Stiffness of left shoulder, not elsewhere classified: Secondary | ICD-10-CM | POA: Diagnosis not present

## 2018-11-17 DIAGNOSIS — M25512 Pain in left shoulder: Secondary | ICD-10-CM | POA: Diagnosis not present

## 2018-11-17 DIAGNOSIS — M6281 Muscle weakness (generalized): Secondary | ICD-10-CM | POA: Diagnosis not present

## 2018-11-17 DIAGNOSIS — Z9889 Other specified postprocedural states: Secondary | ICD-10-CM | POA: Diagnosis not present

## 2018-11-17 DIAGNOSIS — M25612 Stiffness of left shoulder, not elsewhere classified: Secondary | ICD-10-CM | POA: Diagnosis not present

## 2018-11-24 DIAGNOSIS — M6281 Muscle weakness (generalized): Secondary | ICD-10-CM | POA: Diagnosis not present

## 2018-11-24 DIAGNOSIS — M25512 Pain in left shoulder: Secondary | ICD-10-CM | POA: Diagnosis not present

## 2018-11-24 DIAGNOSIS — Z9889 Other specified postprocedural states: Secondary | ICD-10-CM | POA: Diagnosis not present

## 2018-11-24 DIAGNOSIS — M25612 Stiffness of left shoulder, not elsewhere classified: Secondary | ICD-10-CM | POA: Diagnosis not present

## 2018-11-28 DIAGNOSIS — M25512 Pain in left shoulder: Secondary | ICD-10-CM | POA: Diagnosis not present

## 2018-11-30 DIAGNOSIS — Z9889 Other specified postprocedural states: Secondary | ICD-10-CM | POA: Diagnosis not present

## 2018-11-30 DIAGNOSIS — M6281 Muscle weakness (generalized): Secondary | ICD-10-CM | POA: Diagnosis not present

## 2018-11-30 DIAGNOSIS — M25512 Pain in left shoulder: Secondary | ICD-10-CM | POA: Diagnosis not present

## 2018-11-30 DIAGNOSIS — M25612 Stiffness of left shoulder, not elsewhere classified: Secondary | ICD-10-CM | POA: Diagnosis not present

## 2018-12-05 DIAGNOSIS — Z9889 Other specified postprocedural states: Secondary | ICD-10-CM | POA: Diagnosis not present

## 2018-12-05 DIAGNOSIS — M6281 Muscle weakness (generalized): Secondary | ICD-10-CM | POA: Diagnosis not present

## 2018-12-05 DIAGNOSIS — M25612 Stiffness of left shoulder, not elsewhere classified: Secondary | ICD-10-CM | POA: Diagnosis not present

## 2018-12-05 DIAGNOSIS — M25512 Pain in left shoulder: Secondary | ICD-10-CM | POA: Diagnosis not present

## 2018-12-07 ENCOUNTER — Other Ambulatory Visit: Payer: Self-pay | Admitting: Internal Medicine

## 2018-12-07 DIAGNOSIS — M25512 Pain in left shoulder: Secondary | ICD-10-CM | POA: Diagnosis not present

## 2018-12-07 DIAGNOSIS — M25612 Stiffness of left shoulder, not elsewhere classified: Secondary | ICD-10-CM | POA: Diagnosis not present

## 2018-12-07 DIAGNOSIS — M6281 Muscle weakness (generalized): Secondary | ICD-10-CM | POA: Diagnosis not present

## 2018-12-07 DIAGNOSIS — Z9889 Other specified postprocedural states: Secondary | ICD-10-CM | POA: Diagnosis not present

## 2018-12-10 ENCOUNTER — Other Ambulatory Visit: Payer: Self-pay | Admitting: Internal Medicine

## 2018-12-12 DIAGNOSIS — M7502 Adhesive capsulitis of left shoulder: Secondary | ICD-10-CM | POA: Insufficient documentation

## 2018-12-13 ENCOUNTER — Telehealth: Payer: Self-pay | Admitting: Internal Medicine

## 2018-12-13 ENCOUNTER — Ambulatory Visit (INDEPENDENT_AMBULATORY_CARE_PROVIDER_SITE_OTHER): Payer: PPO

## 2018-12-13 DIAGNOSIS — D518 Other vitamin B12 deficiency anemias: Secondary | ICD-10-CM | POA: Diagnosis not present

## 2018-12-13 DIAGNOSIS — M25512 Pain in left shoulder: Secondary | ICD-10-CM | POA: Diagnosis not present

## 2018-12-13 MED ORDER — CYANOCOBALAMIN 1000 MCG/ML IJ SOLN
1000.0000 ug | Freq: Once | INTRAMUSCULAR | Status: AC
  Administered 2018-12-13: 1000 ug via INTRAMUSCULAR

## 2018-12-13 NOTE — Progress Notes (Signed)
Pt was seen today for a B-12 shot given Im in the RD. Pt tolerated well.

## 2018-12-15 ENCOUNTER — Encounter: Payer: Self-pay | Admitting: *Deleted

## 2018-12-15 ENCOUNTER — Other Ambulatory Visit: Payer: Self-pay

## 2018-12-21 ENCOUNTER — Other Ambulatory Visit: Payer: Self-pay | Admitting: Gastroenterology

## 2018-12-21 ENCOUNTER — Ambulatory Visit
Admission: RE | Admit: 2018-12-21 | Discharge: 2018-12-21 | Disposition: A | Discharge status: Home / Self Care | Payer: PPO | Attending: Surgery | Admitting: Surgery

## 2018-12-21 ENCOUNTER — Ambulatory Visit: Payer: PPO | Admitting: Anesthesiology

## 2018-12-21 ENCOUNTER — Encounter
Admission: RE | Disposition: A | Discharge status: Home / Self Care | Payer: Self-pay | Source: Home / Self Care | Attending: Surgery

## 2018-12-21 DIAGNOSIS — I442 Atrioventricular block, complete: Secondary | ICD-10-CM | POA: Insufficient documentation

## 2018-12-21 DIAGNOSIS — M069 Rheumatoid arthritis, unspecified: Secondary | ICD-10-CM | POA: Insufficient documentation

## 2018-12-21 DIAGNOSIS — G8918 Other acute postprocedural pain: Secondary | ICD-10-CM | POA: Diagnosis not present

## 2018-12-21 DIAGNOSIS — Z888 Allergy status to other drugs, medicaments and biological substances status: Secondary | ICD-10-CM | POA: Insufficient documentation

## 2018-12-21 DIAGNOSIS — Z8249 Family history of ischemic heart disease and other diseases of the circulatory system: Secondary | ICD-10-CM | POA: Insufficient documentation

## 2018-12-21 DIAGNOSIS — M19012 Primary osteoarthritis, left shoulder: Secondary | ICD-10-CM | POA: Diagnosis not present

## 2018-12-21 DIAGNOSIS — E119 Type 2 diabetes mellitus without complications: Secondary | ICD-10-CM | POA: Diagnosis not present

## 2018-12-21 DIAGNOSIS — M25512 Pain in left shoulder: Secondary | ICD-10-CM | POA: Diagnosis not present

## 2018-12-21 DIAGNOSIS — K219 Gastro-esophageal reflux disease without esophagitis: Secondary | ICD-10-CM | POA: Insufficient documentation

## 2018-12-21 DIAGNOSIS — L4050 Arthropathic psoriasis, unspecified: Secondary | ICD-10-CM | POA: Insufficient documentation

## 2018-12-21 DIAGNOSIS — Z886 Allergy status to analgesic agent status: Secondary | ICD-10-CM | POA: Diagnosis not present

## 2018-12-21 DIAGNOSIS — Z881 Allergy status to other antibiotic agents status: Secondary | ICD-10-CM | POA: Insufficient documentation

## 2018-12-21 DIAGNOSIS — K589 Irritable bowel syndrome without diarrhea: Secondary | ICD-10-CM | POA: Insufficient documentation

## 2018-12-21 DIAGNOSIS — Z7951 Long term (current) use of inhaled steroids: Secondary | ICD-10-CM | POA: Insufficient documentation

## 2018-12-21 DIAGNOSIS — M7582 Other shoulder lesions, left shoulder: Secondary | ICD-10-CM | POA: Diagnosis not present

## 2018-12-21 DIAGNOSIS — Z90710 Acquired absence of both cervix and uterus: Secondary | ICD-10-CM | POA: Insufficient documentation

## 2018-12-21 DIAGNOSIS — I10 Essential (primary) hypertension: Secondary | ICD-10-CM | POA: Diagnosis not present

## 2018-12-21 DIAGNOSIS — Z794 Long term (current) use of insulin: Secondary | ICD-10-CM | POA: Insufficient documentation

## 2018-12-21 DIAGNOSIS — M7502 Adhesive capsulitis of left shoulder: Secondary | ICD-10-CM | POA: Diagnosis not present

## 2018-12-21 DIAGNOSIS — J45909 Unspecified asthma, uncomplicated: Secondary | ICD-10-CM | POA: Diagnosis not present

## 2018-12-21 DIAGNOSIS — Z9049 Acquired absence of other specified parts of digestive tract: Secondary | ICD-10-CM | POA: Insufficient documentation

## 2018-12-21 DIAGNOSIS — Z79899 Other long term (current) drug therapy: Secondary | ICD-10-CM | POA: Diagnosis not present

## 2018-12-21 DIAGNOSIS — K449 Diaphragmatic hernia without obstruction or gangrene: Secondary | ICD-10-CM | POA: Diagnosis not present

## 2018-12-21 HISTORY — DX: Adverse effect of unspecified anesthetic, initial encounter: T41.45XA

## 2018-12-21 HISTORY — PX: SHOULDER CLOSED REDUCTION: SHX1051

## 2018-12-21 HISTORY — DX: Other complications of anesthesia, initial encounter: T88.59XA

## 2018-12-21 LAB — GLUCOSE, CAPILLARY
Glucose-Capillary: 122 mg/dL — ABNORMAL HIGH (ref 70–99)
Glucose-Capillary: 110 mg/dL — ABNORMAL HIGH (ref 70–99)

## 2018-12-21 SURGERY — MANIPULATION, JOINT, SHOULDER, WITH ANESTHESIA
Anesthesia: General | Site: Shoulder | Laterality: Left

## 2018-12-21 MED ORDER — ROPIVACAINE HCL 5 MG/ML IJ SOLN
INTRAMUSCULAR | Status: DC | PRN
  Administered 2018-12-21: 30 mL via EPIDURAL

## 2018-12-21 MED ORDER — TRIAMCINOLONE ACETONIDE 40 MG/ML IJ SUSP
INTRAMUSCULAR | Status: DC | PRN
  Administered 2018-12-21: 40 mg

## 2018-12-21 MED ORDER — OXYCODONE HCL 5 MG PO TABS: 5.0000 mg | ORAL_TABLET | ORAL | Status: DC | PRN

## 2018-12-21 MED ORDER — PROPOFOL 10 MG/ML IV BOLUS
INTRAVENOUS | Status: DC | PRN
  Administered 2018-12-21: 70 mg via INTRAVENOUS

## 2018-12-21 MED ORDER — LACTATED RINGERS IV SOLN
INTRAVENOUS | Status: DC
  Administered 2018-12-21: 10:00:00 via INTRAVENOUS

## 2018-12-21 MED ORDER — LIDOCAINE HCL (CARDIAC) PF 100 MG/5ML IV SOSY
PREFILLED_SYRINGE | INTRAVENOUS | Status: DC | PRN
  Administered 2018-12-21: 30 mg via INTRAVENOUS

## 2018-12-21 MED ORDER — ONDANSETRON HCL 4 MG/2ML IJ SOLN: 4.0000 mg | Freq: Four times a day (QID) | INTRAMUSCULAR | Status: DC | PRN

## 2018-12-21 MED ORDER — ACETAMINOPHEN 160 MG/5ML PO SOLN: 325.0000 mg | ORAL | Status: DC | PRN

## 2018-12-21 MED ORDER — ONDANSETRON HCL 4 MG PO TABS: 4.0000 mg | ORAL_TABLET | Freq: Four times a day (QID) | ORAL | Status: DC | PRN

## 2018-12-21 MED ORDER — MIDAZOLAM HCL 2 MG/2ML IJ SOLN
INTRAMUSCULAR | Status: DC | PRN
  Administered 2018-12-21: 2 mg via INTRAVENOUS

## 2018-12-21 MED ORDER — BUPIVACAINE HCL 0.25 % IJ SOLN
INTRAMUSCULAR | Status: DC | PRN
  Administered 2018-12-21: 9 mL

## 2018-12-21 MED ORDER — METOCLOPRAMIDE HCL 5 MG PO TABS: 5.0000 mg | ORAL_TABLET | Freq: Three times a day (TID) | ORAL | Status: DC | PRN

## 2018-12-21 MED ORDER — ACETAMINOPHEN 325 MG PO TABS: 325.0000 mg | ORAL_TABLET | ORAL | Status: DC | PRN

## 2018-12-21 MED ORDER — FENTANYL CITRATE (PF) 100 MCG/2ML IJ SOLN
INTRAMUSCULAR | Status: DC | PRN
  Administered 2018-12-21: 50 ug via INTRAVENOUS

## 2018-12-21 MED ORDER — POTASSIUM CHLORIDE IN NACL 20-0.9 MEQ/L-% IV SOLN: INTRAVENOUS | Status: DC

## 2018-12-21 MED ORDER — METOCLOPRAMIDE HCL 5 MG/ML IJ SOLN: 5.0000 mg | Freq: Three times a day (TID) | INTRAMUSCULAR | Status: DC | PRN

## 2018-12-21 SURGICAL SUPPLY — 9 items
BNDG ADH 2 X3.75 FABRIC TAN LF (GAUZE/BANDAGES/DRESSINGS) ×2 IMPLANT
KIT TURNOVER KIT A (KITS) ×2 IMPLANT
NEEDLE HYPO 21X1.5 SAFETY (NEEDLE) ×2 IMPLANT
PAD ALCOHOL SWAB (MISCELLANEOUS) ×2 IMPLANT
SLING ARM LRG DEEP (SOFTGOODS) IMPLANT
SLING ARM M TX990204 (SOFTGOODS) ×2 IMPLANT
SLING ARM S TX990203 (SOFTGOODS) IMPLANT
STRAP BODY AND KNEE 60X3 (MISCELLANEOUS) IMPLANT
SYR 10ML LL (SYRINGE) ×2 IMPLANT

## 2018-12-21 NOTE — Transfer of Care (Signed)
Immediate Anesthesia Transfer of Care Note  Patient: Ashley Gross  Procedure(s) Performed: MANIPULATION UNDER ANESTHESIA WITH STEROID INJECTION (Left Shoulder)  Patient Location: PACU  Anesthesia Type: General  Level of Consciousness: awake, alert  and patient cooperative  Airway and Oxygen Therapy: Patient Spontanous Breathing and Patient connected to supplemental oxygen  Post-op Assessment: Post-op Vital signs reviewed, Patient's Cardiovascular Status Stable, Respiratory Function Stable, Patent Airway and No signs of Nausea or vomiting  Post-op Vital Signs: Reviewed and stable  Complications: No apparent anesthesia complications

## 2018-12-21 NOTE — Anesthesia Procedure Notes (Signed)
Anesthesia Regional Block: Interscalene brachial plexus block   Pre-Anesthetic Checklist: ,, timeout performed, Correct Patient, Correct Site, Correct Laterality, Correct Procedure, Correct Position, site marked, Risks and benefits discussed,  Surgical consent,  Pre-op evaluation,  At surgeon's request and post-op pain management  Laterality: Left  Prep: chloraprep       Needles:  Injection technique: Single-shot  Needle Type: Stimiplex     Needle Length: 10cm  Needle Gauge: 21     Additional Needles:   Procedures:,,,, ultrasound used (permanent image in chart),,,,  Narrative:  Start time: 12/21/2018 9:55 AM End time: 12/21/2018 10:00 AM Injection made incrementally with aspirations every 5 mL.  Performed by: Personally  Anesthesiologist: Rochel Brome, MD  Additional Notes: Functioning IV was confirmed and monitors applied. Ultrasound guidance: relevant anatomy identified, needle position confirmed, local anesthetic spread visualized around nerve(s)., vascular puncture avoided.  Image printed for medical record.  Negative aspiration and no paresthesias; incremental administration of local anesthetic. The patient tolerated the procedure well. Vitals signes recorded in RN notes.

## 2018-12-21 NOTE — Progress Notes (Signed)
Assisted Rochel Brome ANMD with left, ultrasound guided interscalene block. Side rails up, monitors on throughout procedure. See vital signs in flow sheet. Tolerated Procedure well.

## 2018-12-21 NOTE — Anesthesia Procedure Notes (Signed)
Date/Time: 12/21/2018 10:54 AM Performed by: Cameron Ali, CRNA Pre-anesthesia Checklist: Patient identified, Emergency Drugs available, Suction available, Timeout performed and Patient being monitored Patient Re-evaluated:Patient Re-evaluated prior to induction Oxygen Delivery Method: Nasal cannula Placement Confirmation: positive ETCO2

## 2018-12-21 NOTE — H&P (Signed)
Paper H&P to be scanned into permanent record. H&P reviewed and patient re-examined. No changes. 

## 2018-12-21 NOTE — Anesthesia Preprocedure Evaluation (Addendum)
Anesthesia Evaluation  Patient identified by MRN, date of birth, ID band Patient awake    Reviewed: Allergy & Precautions, H&P , NPO status , Patient's Chart, lab work & pertinent test results, reviewed documented beta blocker date and time   History of Anesthesia Complications (+) history of anesthetic complications  Airway Mallampati: I  TM Distance: >3 FB Neck ROM: full    Dental no notable dental hx.    Pulmonary asthma ,    Pulmonary exam normal breath sounds clear to auscultation       Cardiovascular Exercise Tolerance: Good hypertension, Normal cardiovascular exam Rhythm:regular Rate:Normal     Neuro/Psych negative neurological ROS  negative psych ROS   GI/Hepatic Neg liver ROS, hiatal hernia, GERD  ,  Endo/Other  diabetes  Renal/GU negative Renal ROS  negative genitourinary   Musculoskeletal   Abdominal   Peds  Hematology negative hematology ROS (+)   Anesthesia Other Findings   Reproductive/Obstetrics negative OB ROS                           Anesthesia Physical Anesthesia Plan  ASA: II  Anesthesia Plan: General   Post-op Pain Management:  Regional for Post-op pain   Induction:   PONV Risk Score and Plan:   Airway Management Planned:   Additional Equipment:   Intra-op Plan:   Post-operative Plan:   Informed Consent: I have reviewed the patients History and Physical, chart, labs and discussed the procedure including the risks, benefits and alternatives for the proposed anesthesia with the patient or authorized representative who has indicated his/her understanding and acceptance.     Dental Advisory Given  Plan Discussed with: CRNA and Anesthesiologist  Anesthesia Plan Comments:         Anesthesia Quick Evaluation

## 2018-12-21 NOTE — Anesthesia Postprocedure Evaluation (Signed)
Anesthesia Post Note  Patient: Ashley Gross  Procedure(s) Performed: MANIPULATION UNDER ANESTHESIA WITH STEROID INJECTION (Left Shoulder)  Patient location during evaluation: PACU Anesthesia Type: General Level of consciousness: awake and alert Pain management: pain level controlled Vital Signs Assessment: post-procedure vital signs reviewed and stable Respiratory status: spontaneous breathing, nonlabored ventilation, respiratory function stable and patient connected to nasal cannula oxygen Cardiovascular status: blood pressure returned to baseline and stable Postop Assessment: no apparent nausea or vomiting Anesthetic complications: no    Trecia Rogers

## 2018-12-21 NOTE — Op Note (Signed)
12/21/2018  11:05 AM  Patient:   Ashley Gross  Pre-Op Diagnosis:   Secondary adhesive capsulitis, left shoulder.  Post-Op Diagnosis:   Same  Procedure:   Manipulation under anesthesia with steroid injection, left shoulder.  Surgeon:   Pascal Lux, MD  Assistant:   None  Anesthesia:   IV sedation with interscalene block placed preoperatively by anesthesiologist.  Findings:   As above. Prior to manipulation, the left shoulder could be forward flexed to 135 and abducted to 130. At 90 of abduction, the shoulder could be externally rotated to 60 and internally rotated to 50. Following manipulation, the shoulder could be forward flexed to 165, abducted to 160 and, at 90 of abduction, externally rotated to 90 and internally rotated to 75.  Complications:   None  EBL:   0 cc  Fluids:   250 cc crystalloid  TT:   None  Drains:   None  Closure:   None  Brief Clinical Note:   The patient is a 68 year old female who is now 3 months status post a left shoulder arthroscopy with debridement, decompression, and rotator cuff repair. Despite extensive physical therapy, the patient continues to have difficulty regaining shoulder range of motion. The patient's history and examination are consistent with adhesive capsulitis. The patient presents at this time for a manipulation under anesthesia with steroid injection of the left shoulder.  Procedure:   The patient underwent placement of an interscalene block in the preoperative holding area before being brought into the operating room and lain in the supine position. After adequate IV sedation was achieved, a timeout was performed to verify the correct surgical site. The left shoulder was gently manipulated in both abduction and external rotation, as well as adduction and internal rotation. Several palpable and audible pops were heard as the scar tissue released, permitting full range of motion of the shoulder. The glenohumeral joint was  injected sterilely using 1 cc of Kenalog-40 and 9 cc of 0.25% Sensorcaine with epinephrine before the patient was placed into a sling. The patient was then awakened and returned to the recovery room in satisfactory condition after tolerating the procedure well.

## 2018-12-21 NOTE — Discharge Instructions (Signed)
General Anesthesia, Adult, Care After °This sheet gives you information about how to care for yourself after your procedure. Your health care provider may also give you more specific instructions. If you have problems or questions, contact your health care provider. °What can I expect after the procedure? °After the procedure, the following side effects are common: °· Pain or discomfort at the IV site. °· Nausea. °· Vomiting. °· Sore throat. °· Trouble concentrating. °· Feeling cold or chills. °· Weak or tired. °· Sleepiness and fatigue. °· Soreness and body aches. These side effects can affect parts of the body that were not involved in surgery. °Follow these instructions at home: ° °For at least 24 hours after the procedure: °· Have a responsible adult stay with you. It is important to have someone help care for you until you are awake and alert. °· Rest as needed. °· Do not: °? Participate in activities in which you could fall or become injured. °? Drive. °? Use heavy machinery. °? Drink alcohol. °? Take sleeping pills or medicines that cause drowsiness. °? Make important decisions or sign legal documents. °? Take care of children on your own. °Eating and drinking °· Follow any instructions from your health care provider about eating or drinking restrictions. °· When you feel hungry, start by eating small amounts of foods that are soft and easy to digest (bland), such as toast. Gradually return to your regular diet. °· Drink enough fluid to keep your urine pale yellow. °· If you vomit, rehydrate by drinking water, juice, or clear broth. °General instructions °· If you have sleep apnea, surgery and certain medicines can increase your risk for breathing problems. Follow instructions from your health care provider about wearing your sleep device: °? Anytime you are sleeping, including during daytime naps. °? While taking prescription pain medicines, sleeping medicines, or medicines that make you drowsy. °· Return to  your normal activities as told by your health care provider. Ask your health care provider what activities are safe for you. °· Take over-the-counter and prescription medicines only as told by your health care provider. °· If you smoke, do not smoke without supervision. °· Keep all follow-up visits as told by your health care provider. This is important. °Contact a health care provider if: °· You have nausea or vomiting that does not get better with medicine. °· You cannot eat or drink without vomiting. °· You have pain that does not get better with medicine. °· You are unable to pass urine. °· You develop a skin rash. °· You have a fever. °· You have redness around your IV site that gets worse. °Get help right away if: °· You have difficulty breathing. °· You have chest pain. °· You have blood in your urine or stool, or you vomit blood. °Summary °· After the procedure, it is common to have a sore throat or nausea. It is also common to feel tired. °· Have a responsible adult stay with you for the first 24 hours after general anesthesia. It is important to have someone help care for you until you are awake and alert. °· When you feel hungry, start by eating small amounts of foods that are soft and easy to digest (bland), such as toast. Gradually return to your regular diet. °· Drink enough fluid to keep your urine pale yellow. °· Return to your normal activities as told by your health care provider. Ask your health care provider what activities are safe for you. °This information is not   intended to replace advice given to you by your health care provider. Make sure you discuss any questions you have with your health care provider. Document Released: 01/25/2001 Document Revised: 06/04/2017 Document Reviewed: 06/04/2017 Elsevier Interactive Patient Education  2019 [place] discharge instructions: Use sling only until block wears off then remove sling and resume normal daily activities. May  shower tomorrow once block has worn off.  Apply ice frequently to shoulder. Take ibuprofen 600 mg TID with meals for 5-7 days, then as necessary. Take extra strength Tylenol or pain medication as necessary. Begin physical therapy tomorrow as scheduled. Follow-up in 10-14 days or as scheduled.

## 2018-12-22 ENCOUNTER — Other Ambulatory Visit: Payer: Self-pay | Admitting: Internal Medicine

## 2018-12-22 ENCOUNTER — Telehealth: Payer: Self-pay | Admitting: Gastroenterology

## 2018-12-22 DIAGNOSIS — M25512 Pain in left shoulder: Secondary | ICD-10-CM | POA: Diagnosis not present

## 2018-12-22 DIAGNOSIS — M25612 Stiffness of left shoulder, not elsewhere classified: Secondary | ICD-10-CM | POA: Diagnosis not present

## 2018-12-22 DIAGNOSIS — M6281 Muscle weakness (generalized): Secondary | ICD-10-CM | POA: Diagnosis not present

## 2018-12-22 MED ORDER — LOSARTAN POTASSIUM 100 MG PO TABS: ORAL_TABLET | ORAL | 0 refills | Status: DC

## 2018-12-22 NOTE — Telephone Encounter (Signed)
Copied from [place] [id number]. Topic: Quick Communication - Rx Refill/Question >> Dec 22, 2018 10:44 AM Rayann Heman wrote: Medication: losartan (COZAAR) 100 MG tablet [[id number]]  would like 90 day supply   Has the patient contacted their pharmacy? yes Preferred Pharmacy (with phone number or street name): [id number] - [address], [phone] (Phone) [phone] (Fax)   Agent: Please be advised that RX refills may take up to 3 business days. We ask that you follow-up with your pharmacy.

## 2018-12-22 NOTE — Telephone Encounter (Signed)
Patient called in stating Dr Marius Ditch told her she needed to have a repeat colonoscopy in a yr her last one was done 12-27-17. She also needs a refill on dicyclomine (BENTYL) 20 MG tablet  Medication please call into [facility] on [address] in [place]. She called the pharmacy 1st & they advised her to call doctor's office. Please call patient.

## 2018-12-22 NOTE — Telephone Encounter (Signed)
Patient is requesting 90 day supply of medication. Sent for PCP review

## 2018-12-23 ENCOUNTER — Other Ambulatory Visit: Payer: Self-pay

## 2018-12-23 DIAGNOSIS — Z860100 Personal history of colon polyps, unspecified: Secondary | ICD-10-CM

## 2018-12-23 NOTE — Telephone Encounter (Signed)
Patient has been scheduled for colonoscopy on 01/04/2019 in [place], pt has been notified and verbalized understanding

## 2018-12-26 ENCOUNTER — Encounter: Payer: Self-pay | Admitting: *Deleted

## 2018-12-26 ENCOUNTER — Other Ambulatory Visit: Payer: Self-pay

## 2018-12-26 ENCOUNTER — Telehealth: Payer: Self-pay | Admitting: Gastroenterology

## 2018-12-26 DIAGNOSIS — M25512 Pain in left shoulder: Secondary | ICD-10-CM | POA: Diagnosis not present

## 2018-12-26 DIAGNOSIS — M25612 Stiffness of left shoulder, not elsewhere classified: Secondary | ICD-10-CM | POA: Diagnosis not present

## 2018-12-26 DIAGNOSIS — Z9889 Other specified postprocedural states: Secondary | ICD-10-CM | POA: Diagnosis not present

## 2018-12-26 DIAGNOSIS — M6281 Muscle weakness (generalized): Secondary | ICD-10-CM | POA: Diagnosis not present

## 2018-12-26 NOTE — Telephone Encounter (Signed)
Called Ms. Mohammad about her concerns regarding ongoing rectal bleeding.  She wanted to discuss about colonoscopy and removal of rest of the inflammatory polyps in her colon that may help to minimize rectal bleeding.  She mailed me a letter requesting a follow-up appointment prior to the colonoscopy.  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  The treatment options are limited including colonoscopy with removal of large symptomatic polyps and trial of immunosuppressive therapy.  I will discuss with her about vedolizumab in addition to colonoscopy with removal of large polyps We will also recheck CBC, iron studies, vitamin B12 and folate levels due to ongoing rectal bleeding  Cephas Darby, MD [place]  [place], [place] [address]  Main: [phone]  Fax: [phone] Pager: [phone]

## 2018-12-28 ENCOUNTER — Ambulatory Visit (INDEPENDENT_AMBULATORY_CARE_PROVIDER_SITE_OTHER): Payer: PPO | Admitting: Gastroenterology

## 2018-12-28 ENCOUNTER — Encounter: Payer: Self-pay | Admitting: Gastroenterology

## 2018-12-28 ENCOUNTER — Other Ambulatory Visit: Payer: Self-pay

## 2018-12-28 VITALS — BP 120/75 | HR 78 | Resp 17 | Ht 64.0 in | Wt 185.0 lb

## 2018-12-28 DIAGNOSIS — M25612 Stiffness of left shoulder, not elsewhere classified: Secondary | ICD-10-CM | POA: Diagnosis not present

## 2018-12-28 DIAGNOSIS — R197 Diarrhea, unspecified: Secondary | ICD-10-CM

## 2018-12-28 DIAGNOSIS — M6281 Muscle weakness (generalized): Secondary | ICD-10-CM | POA: Diagnosis not present

## 2018-12-28 DIAGNOSIS — K625 Hemorrhage of anus and rectum: Secondary | ICD-10-CM

## 2018-12-28 DIAGNOSIS — Z9889 Other specified postprocedural states: Secondary | ICD-10-CM | POA: Diagnosis not present

## 2018-12-28 DIAGNOSIS — J45909 Unspecified asthma, uncomplicated: Secondary | ICD-10-CM | POA: Insufficient documentation

## 2018-12-28 DIAGNOSIS — M25512 Pain in left shoulder: Secondary | ICD-10-CM | POA: Diagnosis not present

## 2018-12-28 MED ORDER — FUSION PLUS PO CAPS: 1.0000 | ORAL_CAPSULE | Freq: Every day | ORAL | 2 refills | Status: AC

## 2018-12-28 MED ORDER — DICYCLOMINE HCL 20 MG PO TABS: 20.0000 mg | ORAL_TABLET | Freq: Three times a day (TID) | ORAL | 0 refills | Status: DC

## 2018-12-28 NOTE — Progress Notes (Signed)
Cephas Darby, MD [address]  [place]  [place], [place] [address]  Main: [phone]  Fax: [phone]    Gastroenterology Consultation  Referring Provider:     Einar Pheasant, MD Primary Care Physician:  Einar Pheasant, MD Primary Gastroenterologist:  Dr. Cephas Darby Reason for Consultation:     Rectal bleeding and chronic diarrhea, inflammatory polyposis of the colon        HPI:   Ashley Gross is a 68 y.o. white female referred by Dr. Einar Pheasant, MD  for consultation & management of chronic diarrhea and rectal bleeding. She has known h/o moderate exocrine EPI, IBS-D, inflammatory polyps in colon was originally seeing Dr Redmond Pulling at [facility] until 2014. She is referred here due to change in her insurance. Has not seen Dr Redmond Pulling since 2014. She had extensive work up including EGD/colonoscopy/VCE and unremarkable biopsies. TTG IgA negative, stool WBCs negative, infectious work up negative, normal serum gastrin. Pancreatic elastase levels were 149 in 01/2013. She was on creon in the past which helped, stopped several years ago. Other medications she tried were cholestyramine, imodium, levsin, dicyclomine 20mg .  She had cholecystectomy several years ago. She had MRI pancreas protocol in 05/2013 which showed normal pancreas  She reports several years h/o chronic non-bloody diarrhea anywhere from 5to10BMs/day or upto 20/day during exacerbations. Denies nocturnal diarrhea or leakage or incontinence. She has been experiencing rectal bleeding since Feb or march 2018, once every 1-2 weeks. Currently, experiencing rectal bleeding every day since 3 months. Seeing blood mixed with stools and sometimes only with mucus. She showed pictures of these episodes on her phone. Her BMs are always loose, 5-6 times/day or even more. She does have severe abdominal cramps a/w diarrhea and rectal bleeding. She lost weight in the past, stable in last 1-2 yrs. She denies n/v/f/c. Her diabetes is  controlled. She can't tolerate most of the foods, has BM immediately after eating. She denies bloating. She has not tried low-FODMAPs diet She had symptomatic hemorrhoids in the past, previously itchy, painful. She denies these symptoms at present other than internal hemorrhoids. She takes PPI as needed only. She denies tobacco, ETOH, NSAID use She does not have anemia  She has h/o psoriatic arthritis, previously on remicade (developed severe myalgias), plaquenil, pencillamine, humira, embrel. Off therapy since 04/2013  Follow-up visit 11/19/2017: She is currently being treated for rectal bleeding with ligation of internal hemorrhoids. I changed from Creon to zenpep, also added Lomotil 2pills 4 times daily. She is here today for follow-up of banding. She continues to have about 5-8 times of nonbloody bowel movements per day. Reports that rectal bleeding is significantly better, still occurs about twice daily.  Follow-up visit 12/28/2018 She has ongoing rectal bleeding with or without a bowel movement, she does have few episodes even without warning.  This has been very frustrating and affecting her quality of life.  I did her colonoscopy last year and removed large inflammatory polyp from the right colon.  She does have few inflammatory large polyps in her left colon.  She is scheduled for colonoscopy to remove most of the polyps.  She continues to have diarrhea which is manageable with Imodium.  She is taking Bentyl as needed for cramps.  She also underwent outpatient hemorrhoid ligation of all 3 hemorrhoids.  She does not have pancreatic insufficiency, therefore I stopped her pancreatic enzymes.  GI Procedures:  Colonoscopy 04/06/2007 Indications:last colonoscopy 2001, anemia. Impression: -Congested mucosa in the entire colon. -Nodules in the  proximal ascending colon, biopsied. -Nodule in the distal ascending colon, biopsied. -Congested erythematous and friable (with contact bleeding) mucosa in  the  ascending colon and in the cecum. . "CECUM" (ENDOSCOPIC BIOPSY) COLONIC MUCOSA WITH NO PATHOLOGIC DIAGNOSIS. NO ACTIVE OR CHRONIC COLITIS IS SEEN. NO EVIDENCE OF LYMPHOCYTIC OR COLLAGENOUS COLITIS IS SEEN. B. "RIGHT COLON NODULES" (ENDOSCOPIC BIOPSY): INFLAMMATORY POLYPS. C. "RECTOSIGMOID COLON" (ENDOSCOPIC BIOPSY): COLONIC MUCOSA WITH NO PATHOLOGIC DIAGNOSIS. NO ACTIVE OR CHRONIC COLITIS IS SEEN. NO EVIDENCE OF LYMPHOCYTIC OR COLLAGENOUS COLITIS IS SEEN. D. "DISTAL ASCENDING COLON" (ENDOSCOPIC BIOPSY): INFLAMMATORY POLYPS. ------------------------------------------------------------------------------------------------- Colonoscopy 2014  A. "ILEUM" (ENDOSCOPIC BIOPSY): SMALL INTESTINAL MUCOSA WITH NO PATHOLOGIC DIAGNOSIS. NO ACTIVE OR CHRONIC ENTERITIS IS SEEN. NO GRANULOMATOUS INFLAMMATION IS SEEN. B. "COLON, ILEOCECAL VALVE NODULE" (BIOPSY): COLONIC MUCOSA, WITH REACTIVE CHANGES. NO ADENOMATOUS MUCOSA IS IDENTIFIED. C. "RIGHT COLON" (ENDOSCOPIC BIOPSY): COLONIC MUCOSA WITH NO PATHOLOGIC DIAGNOSIS. NO ACTIVE OR CHRONIC COLITIS IS SEEN. NO EVIDENCE OF LYMPHOCYTIC OR COLLAGENOUS COLITIS IS SEEN. D. "HEPATIC FLEXURE MASS" (ENDOSCOPIC BIOPSY): INFLAMMATORY POLYP. NO ADENOMATOUS MUCOSA IS SEEN. E. "TRANSVERSE COLON" (ENDOSCOPIC BIOPSY): COLONIC MUCOSA WITH NO PATHOLOGIC DIAGNOSIS. NO ACTIVE OR CHRONIC COLITIS IS SEEN. NO EVIDENCE OF LYMPHOCYTIC OR COLLAGENOUS COLITIS IS SEEN. F. "LEFT COLON" (ENDOSCOPIC BIOPSY): COLONIC MUCOSA WITH NO PATHOLOGIC DIAGNOSIS. NO ACTIVE OR CHRONIC COLITIS IS SEEN. NO EVIDENCE OF LYMPHOCYTIC OR COLLAGENOUS COLITIS IS SEEN. A MINUTE HYPERPLASTIC POLYP IS ALSO PRESENT. G. "LEFT COLON POLYP(S)" (ENDOSCOPIC POLYPECTOMY): TUBULAR ADENOMA. NO HIGH GRADE DYSPLASIA OR CARCINOMA IS SEEN.  Colonoscopy 12/27/2017 - The examined portion of the ileum  was normal. Biopsied. - One 12 mm polyp in the ascending colon, removed with a hot snare. Resected and retrieved. - One 40 mm polyp in the transverse colon, removed with a hot snare. Incomplete resection. Resected tissue retrieved. - A few 8 to 15 mm, non-bleeding polyps in the sigmoid colon. Resection not attempted. - The distal rectum and anal verge are normal on retroflexion view.   Upper endoscopy 12/27/2017 - Normal duodenal bulb and second portion of the duodenum. Biopsied. - Erythematous mucosa in the gastric body and antrum. - Normal cardia, gastric fundus and incisura. Biopsied. - Normal gastroesophageal junction and esophagus.  DIAGNOSIS:  A. DUODENUM; COLD BIOPSY:  - DUODENAL MUCOSA WITH PRESERVED VILLOUS ARCHITECTURE AND BRUNNER'S  GLAND HYPERPLASIA.  - NEGATIVE FOR INTRA-EPITHELIAL LYMPHOCYTOSIS, DYSPLASIA AND MALIGNANCY.   B. STOMACH; COLD BIOPSY:  - ANTRAL AND OXYNTIC MUCOSA WITH MILD CHRONIC GASTRITIS.  - NEGATIVE FOR H. PYLORI, DYSPLASIA AND MALIGNANCY.   C. TERMINAL ILEUM; COLD BIOPSY:  - SMALL BOWEL MUCOSA WITH INTACT VILLOUS ARCHITECTURE.  - NEGATIVEFOR INTRAEPITHELIAL LYMPHOCYTOSIS, DYSPLASIA AND MALIGNANCY.   D. RANDOM RIGHT COLON; COLD BIOPSY:  - COLONIC MUCOSA NEGATIVE FOR MICROSCOPIC COLITIS, DYSPLASIA AND  MALIGNANCY.    E. COLON POLYP, ASCENDING; HOT SNARE:  - INFLAMMATORY-TYPE POLYP.   F. COLON POLYP, TRANSVERSE; HOT SNARE:  - INFLAMMATORY-TYPE POLYP.   G. RANDOM TRANSVERSE COLON; COLD BIOPSY:  - COLONIC MUCOSA NEGATIVE FOR MICROSCOPIC COLITIS, DYSPLASIA AND  MALIGNANCY.   Past Medical History:  Diagnosis Date  . Anemia, unspecified   . Asthma   . Colitis   . Complication of anesthesia    asthma attack after shoulder surgery  . Diabetes mellitus ([place])   . Esophagitis   . H/O hiatal hernia   . Heart murmur   . Hx of arteriovenous malformation (AVM)   . Hypertension   . IBS (irritable bowel syndrome)   . Nephrolithiasis   .  Pernicious  anemia   . Pneumonia   . Psoriatic arthritis ([facility])    s/p penicillamine, plaquenil, MTX, sulfasalazine.  s/p Embrel, Humira,.  Iritis.    . Pure hypercholesterolemia     Past Surgical History:  Procedure Laterality Date  . ABDOMINAL HYSTERECTOMY  1981   ovaries left in place  . ANKLE SURGERY     right  . BUNIONECTOMY    . CHOLECYSTECTOMY  1989  . COLONOSCOPY WITH PROPOFOL N/A 12/27/2017   Procedure: COLONOSCOPY WITH PROPOFOL;  Surgeon: Lin Landsman, MD;  Location: [facility] ENDOSCOPY;  Service: Gastroenterology;  Laterality: N/A;  . ESOPHAGOGASTRODUODENOSCOPY (EGD) WITH PROPOFOL N/A 12/27/2017   Procedure: ESOPHAGOGASTRODUODENOSCOPY (EGD) WITH PROPOFOL;  Surgeon: Lin Landsman, MD;  Location: [facility] ENDOSCOPY;  Service: Gastroenterology;  Laterality: N/A;  . HAND SURGERY     right  . HEEL SPUR SURGERY    . NOSE SURGERY     x2  . SHOULDER ARTHROSCOPY WITH OPEN ROTATOR CUFF REPAIR Left 09/20/2018   Procedure: SHOULDER ARTHROSCOPY WITH OPEN ROTATOR CUFF REPAIR;  Surgeon: Corky Mull, MD;  Location: [facility] ORS;  Service: Orthopedics;  Laterality: Left;  . SHOULDER CLOSED REDUCTION Left 12/21/2018   Procedure: MANIPULATION UNDER ANESTHESIA WITH STEROID INJECTION;  Surgeon: Corky Mull, MD;  Location: [place];  Service: Orthopedics;  Laterality: Left;  Diabetic - insulin and oral meds  . UMBILICAL HERNIA REPAIR       Current Outpatient Medications:  .  albuterol (ACCUNEB) 0.63 MG/3ML nebulizer solution, Take 3 mLs (0.63 mg total) by nebulization every 6 (six) hours as needed for wheezing., Disp: 75 mL, Rfl: 1 .  amLODipine (NORVASC) 5 MG tablet, TAKE 1 TABLET BY MOUTH TWICE DAILY, Disp: 180 tablet, Rfl: 0 .  Calcium Carbonate-Vit D-Min (CALCIUM 600+D PLUS MINERALS) 600-400 MG-UNIT TABS, Take by mouth., Disp: , Rfl:  .  diphenhydrAMINE (BENADRYL) 25 mg capsule, Take by mouth., Disp: , Rfl:  .  Ergocalciferol (VITAMIN D2) 50 MCG (2000 UT) TABS, Take by mouth  daily., Disp: , Rfl:  .  erythromycin ophthalmic ointment, , Disp: , Rfl:  .  Fluticasone-Salmeterol (ADVAIR DISKUS) 250-50 MCG/DOSE AEPB, Inhale 1 puff into the lungs 2 (two) times daily., Disp: 180 each, Rfl: 1 .  glipiZIDE (GLUCOTROL) 5 MG tablet, Take 1 tablet (5 mg total) by mouth daily., Disp: 90 tablet, Rfl: 1 .  insulin degludec (TRESIBA FLEXTOUCH) 100 UNIT/ML SOPN FlexTouch Pen, Inject 0.2 mLs (20 Units total) into the skin daily., Disp: 12 mL, Rfl: 2 .  Insulin Pen Needle (BD PEN NEEDLE NANO U/F) 32G X 4 MM MISC, Use to inject insulin daily, Disp: 100 each, Rfl: 3 .  ipratropium (ATROVENT HFA) 17 MCG/ACT inhaler, Inhale 2 puffs into the lungs every 6 (six) hours., Disp: , Rfl:  .  loperamide (IMODIUM) 2 MG capsule, Take by mouth., Disp: , Rfl:  .  losartan (COZAAR) 100 MG tablet, TAKE 1 TABLET(100 MG) BY MOUTH DAILY, Disp: 90 tablet, Rfl: 0 .  omeprazole (PRILOSEC) 20 MG capsule, TAKE 1 CAPSULE BY MOUTH ONCE DAILY, Disp: 90 capsule, Rfl: 0 .  ONE TOUCH ULTRA TEST test strip, PATIENT NEEDS NEW METER STRIPS AND LANCETS FOR ONE TOUCH. HER INSURANCE NO LONGER COVERS ACCUCHEK., Disp: 100 each, Rfl: PRN .  pioglitazone (ACTOS) 15 MG tablet, TAKE 1 TABLET BY MOUTH ONCE DAILY, Disp: 90 tablet, Rfl: 0 .  potassium chloride (K-DUR) 10 MEQ tablet, TAKE 1 TABLET(10 MEQ) BY MOUTH TWICE DAILY, Disp: 180 tablet, Rfl: 1 .  rosuvastatin (CRESTOR) 10 MG tablet, Take 1 tablet (10 mg total) by mouth daily., Disp: 90 tablet, Rfl: 0 .  triamterene-hydrochlorothiazide (MAXZIDE-25) 37.5-25 MG tablet, Take 0.5 tablets by mouth daily., Disp: 30 tablet, Rfl: 11 .  Cholecalciferol (VITAMIN D-3) 1000 UNITS CAPS, Take 1 capsule by mouth daily., Disp: , Rfl:  .  citalopram (CELEXA) 10 MG tablet, Take by mouth., Disp: , Rfl:  .  dicyclomine (BENTYL) 20 MG tablet, Take 1 tablet (20 mg total) by mouth 4 (four) times daily -  before meals and at bedtime for 30 days., Disp: 120 tablet, Rfl: 0 .  Iron-FA-B  Cmp-C-Biot-Probiotic (FUSION PLUS) CAPS, Take 1 capsule by mouth daily for 30 days., Disp: 30 capsule, Rfl: 2   Family History  Problem Relation Age of Onset  . Diabetes Other   . Hypertension Other   . Breast cancer Neg Hx   . Colon cancer Neg Hx      Social History   Tobacco Use  . Smoking status: Never Smoker  . Smokeless tobacco: Never Used  Substance Use Topics  . Alcohol use: No    Alcohol/week: 0.0 standard drinks  . Drug use: No    Allergies as of 12/28/2018 - Review Complete 12/28/2018  Allergen Reaction Noted  . Aspirin Other (See Comments) 10/06/2012  . Beta adrenergic blockers  10/06/2012  . Lisinopril  12/21/2018  . Mtx support [cobalamine combinations] Other (See Comments) 10/06/2012  . Vasotec [enalapril] Cough 10/06/2012  . Verapamil Other (See Comments) 10/06/2012  . Voltaren [diclofenac sodium]  10/06/2012  . Erythromycin Other (See Comments) 10/06/2012  . Indocin [indomethacin]  10/06/2012    Review of Systems:    All systems reviewed and negative except where noted in HPI.   Physical Exam:  BP 120/75 (BP Location: Left Arm, Patient Position: Sitting, Cuff Size: Large)   Pulse 78   Resp 17   Ht 5\' 4"  (1.626 m)   Wt 185 lb (83.9 kg)   BMI 31.76 kg/m  No LMP recorded. Patient has had a hysterectomy.  General:   Alert,  Well-developed, well-nourished, pleasant and cooperative in NAD Head:  Normocephalic and atraumatic. Eyes:  Sclera clear, no icterus.   Conjunctiva pink. Ears:  Normal auditory acuity. Nose:  No deformity, discharge, or lesions. Mouth:  No deformity or lesions,oropharynx pink & moist. Neck:  Supple; no masses or thyromegaly. Lungs:  Respirations even and unlabored.  Clear throughout to auscultation.   No wheezes, crackles, or rhonchi. No acute distress. Heart:  Regular rate and rhythm; no murmurs, clicks, rubs, or gallops. Abdomen:  Normal bowel sounds.  Obese, Soft, non-tender and non-distended without masses,  hepatosplenomegaly or hernias noted.  No guarding or rebound tenderness.   Rectal: Not done today Msk:  Symmetrical without gross deformities. Good, equal movement & strength bilaterally. Pulses:  Normal pulses noted. Extremities:  No clubbing or edema.  No cyanosis. Neurologic:  Alert and oriented x3;  grossly normal neurologically. Skin:  Intact without significant lesions or rashes. No jaundice. Psych:  Alert and cooperative. Normal mood and affect.  Imaging Studies: Reviewed  Assessment and Plan:   ZOIE SARIN is a 68 y.o. white female with h/o psoriatic arthritis, chronic diarrhea, inflammatory polyps in colon presents for f/u of rectal bleeding and chronic diarrhea. She underwent ligation of RA, RP. LL internal hemorrhoids. Had extensive workup in the past which is negative for gastrinoma, IBD, microscopic colitis, celiac disease, chronic infections.  Rectal bleeding has improved temporarily after removing large right-sided  inflammatory polyps.  She continues to have rectal bleeding and chronic diarrhea secondary to inflammatory polyposis of the colon.  Inflammatory polyposis of the colon are inflammatory Polyposis is a rare phenomenon which can lead to rectal bleeding and chronic diarrhea.  This may or may not be associated with inflammatory bowel disease.  Her colon biopsies were negative for inflammatory bowel disease.  Few case reports have been mentioned in the literature.  In addition to removal of the large polyps, there might be a role for immunosuppressive therapy such as Biologics.  Given her age, I recommend trial of vedolizumab and patient is agreeable.  I will start the paperwork after colonoscopy next week   Check CBC, iron studies, B12 and folate levels Recommend to start oral iron as her serum ferritin was <30 in 07/2018 with ongoing rectal bleeding Colonoscopy scheduled for next week to remove left-sided large inflammatory polyps Will have pathologist review the slides  to evaluate for inflammatory cap Polyposis  Follow up in 2 to 3 months   Cephas Darby, MD

## 2018-12-29 LAB — IRON AND TIBC
Iron Saturation: 17 % (ref 15–55)
Iron: 73 ug/dL (ref 27–139)
Total Iron Binding Capacity: 420 ug/dL (ref 250–450)
UIBC: 347 ug/dL (ref 118–369)

## 2018-12-29 LAB — B12 AND FOLATE PANEL
Folate: 10.1 ng/mL (ref >3.0)
Vitamin B-12: 679 pg/mL (ref 232–1245)

## 2018-12-29 LAB — CBC
Hematocrit: 37.6 % (ref 34.0–46.6)
Hemoglobin: 12.2 g/dL (ref 11.1–15.9)
MCH: 29.5 pg (ref 26.6–33.0)
MCHC: 32.4 g/dL (ref 31.5–35.7)
MCV: 91 fL (ref 79–97)
Platelets: 291 10*3/uL (ref 150–450)
RBC: 4.14 x10E6/uL (ref 3.77–5.28)
RDW: 13.6 % (ref 11.7–15.4)
WBC: 9.9 10*3/uL (ref 3.4–10.8)

## 2018-12-29 LAB — FERRITIN: Ferritin: 17 ng/mL (ref 15–150)

## 2018-12-30 ENCOUNTER — Ambulatory Visit: Payer: Self-pay | Admitting: Gastroenterology

## 2019-01-02 DIAGNOSIS — M25612 Stiffness of left shoulder, not elsewhere classified: Secondary | ICD-10-CM | POA: Diagnosis not present

## 2019-01-02 DIAGNOSIS — Z9889 Other specified postprocedural states: Secondary | ICD-10-CM | POA: Diagnosis not present

## 2019-01-02 DIAGNOSIS — M25512 Pain in left shoulder: Secondary | ICD-10-CM | POA: Diagnosis not present

## 2019-01-03 NOTE — Discharge Instructions (Signed)
General Anesthesia, Adult, Care After  This sheet gives you information about how to care for yourself after your procedure. Your health care provider may also give you more specific instructions. If you have problems or questions, contact your health care provider.  What can I expect after the procedure?  After the procedure, the following side effects are common:  Pain or discomfort at the IV site.  Nausea.  Vomiting.  Sore throat.  Trouble concentrating.  Feeling cold or chills.  Weak or tired.  Sleepiness and fatigue.  Soreness and body aches. These side effects can affect parts of the body that were not involved in surgery.  Follow these instructions at home:    For at least 24 hours after the procedure:  Have a responsible adult stay with you. It is important to have someone help care for you until you are awake and alert.  Rest as needed.  Do not:  Participate in activities in which you could fall or become injured.  Drive.  Use heavy machinery.  Drink alcohol.  Take sleeping pills or medicines that cause drowsiness.  Make important decisions or sign legal documents.  Take care of children on your own.  Eating and drinking  Follow any instructions from your health care provider about eating or drinking restrictions.  When you feel hungry, start by eating small amounts of foods that are soft and easy to digest (bland), such as toast. Gradually return to your regular diet.  Drink enough fluid to keep your urine pale yellow.  If you vomit, rehydrate by drinking water, juice, or clear broth.  General instructions  If you have sleep apnea, surgery and certain medicines can increase your risk for breathing problems. Follow instructions from your health care provider about wearing your sleep device:  Anytime you are sleeping, including during daytime naps.  While taking prescription pain medicines, sleeping medicines, or medicines that make you drowsy.  Return to your normal activities as told by your health care  provider. Ask your health care provider what activities are safe for you.  Take over-the-counter and prescription medicines only as told by your health care provider.  If you smoke, do not smoke without supervision.  Keep all follow-up visits as told by your health care provider. This is important.  Contact a health care provider if:  You have nausea or vomiting that does not get better with medicine.  You cannot eat or drink without vomiting.  You have pain that does not get better with medicine.  You are unable to pass urine.  You develop a skin rash.  You have a fever.  You have redness around your IV site that gets worse.  Get help right away if:  You have difficulty breathing.  You have chest pain.  You have blood in your urine or stool, or you vomit blood.  Summary  After the procedure, it is common to have a sore throat or nausea. It is also common to feel tired.  Have a responsible adult stay with you for the first 24 hours after general anesthesia. It is important to have someone help care for you until you are awake and alert.  When you feel hungry, start by eating small amounts of foods that are soft and easy to digest (bland), such as toast. Gradually return to your regular diet.  Drink enough fluid to keep your urine pale yellow.  Return to your normal activities as told by your health care provider. Ask your health care   provider what activities are safe for you.  This information is not intended to replace advice given to you by your health care provider. Make sure you discuss any questions you have with your health care provider.  Document Released: 01/25/2001 Document Revised: 06/04/2017 Document Reviewed: 06/04/2017  [employer]vier Interactive Patient Education  2019 [employer]

## 2019-01-04 ENCOUNTER — Ambulatory Visit: Payer: PPO | Admitting: Anesthesiology

## 2019-01-04 ENCOUNTER — Ambulatory Visit
Admission: RE | Admit: 2019-01-04 | Discharge: 2019-01-04 | Disposition: A | Discharge status: Home / Self Care | Payer: PPO | Attending: Gastroenterology | Admitting: Gastroenterology

## 2019-01-04 ENCOUNTER — Encounter
Admission: RE | Disposition: A | Discharge status: Home / Self Care | Payer: Self-pay | Source: Home / Self Care | Attending: Gastroenterology

## 2019-01-04 DIAGNOSIS — Z7951 Long term (current) use of inhaled steroids: Secondary | ICD-10-CM | POA: Diagnosis not present

## 2019-01-04 DIAGNOSIS — K449 Diaphragmatic hernia without obstruction or gangrene: Secondary | ICD-10-CM | POA: Insufficient documentation

## 2019-01-04 DIAGNOSIS — D124 Benign neoplasm of descending colon: Secondary | ICD-10-CM | POA: Diagnosis not present

## 2019-01-04 DIAGNOSIS — Z8249 Family history of ischemic heart disease and other diseases of the circulatory system: Secondary | ICD-10-CM | POA: Diagnosis not present

## 2019-01-04 DIAGNOSIS — K648 Other hemorrhoids: Secondary | ICD-10-CM | POA: Diagnosis not present

## 2019-01-04 DIAGNOSIS — Z881 Allergy status to other antibiotic agents status: Secondary | ICD-10-CM | POA: Diagnosis not present

## 2019-01-04 DIAGNOSIS — Z7982 Long term (current) use of aspirin: Secondary | ICD-10-CM | POA: Insufficient documentation

## 2019-01-04 DIAGNOSIS — Z79899 Other long term (current) drug therapy: Secondary | ICD-10-CM | POA: Diagnosis not present

## 2019-01-04 DIAGNOSIS — Z90710 Acquired absence of both cervix and uterus: Secondary | ICD-10-CM | POA: Insufficient documentation

## 2019-01-04 DIAGNOSIS — Z886 Allergy status to analgesic agent status: Secondary | ICD-10-CM | POA: Insufficient documentation

## 2019-01-04 DIAGNOSIS — Z860100 Personal history of colon polyps, unspecified: Secondary | ICD-10-CM

## 2019-01-04 DIAGNOSIS — Z888 Allergy status to other drugs, medicaments and biological substances status: Secondary | ICD-10-CM | POA: Insufficient documentation

## 2019-01-04 DIAGNOSIS — J45909 Unspecified asthma, uncomplicated: Secondary | ICD-10-CM | POA: Insufficient documentation

## 2019-01-04 DIAGNOSIS — Z791 Long term (current) use of non-steroidal anti-inflammatories (NSAID): Secondary | ICD-10-CM | POA: Insufficient documentation

## 2019-01-04 DIAGNOSIS — L4050 Arthropathic psoriasis, unspecified: Secondary | ICD-10-CM | POA: Diagnosis not present

## 2019-01-04 DIAGNOSIS — K649 Unspecified hemorrhoids: Secondary | ICD-10-CM | POA: Diagnosis not present

## 2019-01-04 DIAGNOSIS — D125 Benign neoplasm of sigmoid colon: Secondary | ICD-10-CM | POA: Diagnosis not present

## 2019-01-04 DIAGNOSIS — K589 Irritable bowel syndrome without diarrhea: Secondary | ICD-10-CM | POA: Insufficient documentation

## 2019-01-04 DIAGNOSIS — Z794 Long term (current) use of insulin: Secondary | ICD-10-CM | POA: Diagnosis not present

## 2019-01-04 DIAGNOSIS — E7800 Pure hypercholesterolemia, unspecified: Secondary | ICD-10-CM | POA: Insufficient documentation

## 2019-01-04 DIAGNOSIS — Z9049 Acquired absence of other specified parts of digestive tract: Secondary | ICD-10-CM | POA: Diagnosis not present

## 2019-01-04 DIAGNOSIS — E119 Type 2 diabetes mellitus without complications: Secondary | ICD-10-CM | POA: Insufficient documentation

## 2019-01-04 DIAGNOSIS — K5140 Inflammatory polyps of colon without complications: Secondary | ICD-10-CM | POA: Diagnosis not present

## 2019-01-04 DIAGNOSIS — Z833 Family history of diabetes mellitus: Secondary | ICD-10-CM | POA: Diagnosis not present

## 2019-01-04 DIAGNOSIS — I10 Essential (primary) hypertension: Secondary | ICD-10-CM | POA: Insufficient documentation

## 2019-01-04 DIAGNOSIS — K51411 Inflammatory polyps of colon with rectal bleeding: Secondary | ICD-10-CM | POA: Diagnosis not present

## 2019-01-04 DIAGNOSIS — K625 Hemorrhage of anus and rectum: Secondary | ICD-10-CM | POA: Diagnosis present

## 2019-01-04 HISTORY — PX: COLONOSCOPY WITH PROPOFOL: SHX5780

## 2019-01-04 HISTORY — PX: POLYPECTOMY: SHX5525

## 2019-01-04 LAB — GLUCOSE, CAPILLARY
Glucose-Capillary: 107 mg/dL — ABNORMAL HIGH (ref 70–99)
Glucose-Capillary: 177 mg/dL — ABNORMAL HIGH (ref 70–99)

## 2019-01-04 SURGERY — COLONOSCOPY WITH PROPOFOL
Anesthesia: General | Site: Rectum

## 2019-01-04 MED ORDER — GLYCOPYRROLATE 0.2 MG/ML IJ SOLN
INTRAMUSCULAR | Status: DC | PRN
  Administered 2019-01-04: 0.2 mg via INTRAVENOUS

## 2019-01-04 MED ORDER — SODIUM CHLORIDE 0.9 % IV SOLN: INTRAVENOUS | Status: DC

## 2019-01-04 MED ORDER — OXYCODONE HCL 5 MG/5ML PO SOLN: 5.0000 mg | Freq: Once | ORAL | Status: DC | PRN

## 2019-01-04 MED ORDER — OXYCODONE HCL 5 MG PO TABS: 5.0000 mg | ORAL_TABLET | Freq: Once | ORAL | Status: DC | PRN

## 2019-01-04 MED ORDER — STERILE WATER FOR IRRIGATION IR SOLN
Status: DC | PRN
  Administered 2019-01-04: .05 mL

## 2019-01-04 MED ORDER — LACTATED RINGERS IV SOLN
INTRAVENOUS | Status: DC
  Administered 2019-01-04: 07:00:00 via INTRAVENOUS

## 2019-01-04 MED ORDER — LIDOCAINE HCL (CARDIAC) PF 100 MG/5ML IV SOSY
PREFILLED_SYRINGE | INTRAVENOUS | Status: DC | PRN
  Administered 2019-01-04: 40 mg via INTRAVENOUS

## 2019-01-04 MED ORDER — DEXAMETHASONE SODIUM PHOSPHATE 10 MG/ML IJ SOLN
INTRAMUSCULAR | Status: DC | PRN
  Administered 2019-01-04: 4 mg via INTRAVENOUS

## 2019-01-04 MED ORDER — ONDANSETRON HCL 4 MG/2ML IJ SOLN
INTRAMUSCULAR | Status: DC | PRN
  Administered 2019-01-04: 4 mg via INTRAVENOUS

## 2019-01-04 MED ORDER — PROPOFOL 10 MG/ML IV BOLUS
INTRAVENOUS | Status: DC | PRN
  Administered 2019-01-04: 80 mg via INTRAVENOUS
  Administered 2019-01-04: 100 mg via INTRAVENOUS
  Administered 2019-01-04: 50 mg via INTRAVENOUS
  Administered 2019-01-04: 20 mg via INTRAVENOUS

## 2019-01-04 SURGICAL SUPPLY — 12 items
CANISTER SUCT 1200ML W/VALVE (MISCELLANEOUS) ×3 IMPLANT
COLOWRAP REGULAR (MISCELLANEOUS) ×3
COMPRESSION COLOWRAP REGULAR (MISCELLANEOUS) ×2 IMPLANT
ELECT REM PT RETURN 9FT ADLT (ELECTROSURGICAL) ×3
ELECTRODE REM PT RTRN 9FT ADLT (ELECTROSURGICAL) ×2 IMPLANT
GOWN CVR UNV OPN BCK APRN NK (MISCELLANEOUS) ×4 IMPLANT
GOWN ISOL THUMB LOOP REG UNIV (MISCELLANEOUS) ×2
KIT ENDO PROCEDURE OLY (KITS) ×3 IMPLANT
RETRIEVER NET ROTH 2.5X230 LF (MISCELLANEOUS) ×3 IMPLANT
SNARE LASSO HEX 3 IN 1 (INSTRUMENTS) ×3 IMPLANT
TRAP ETRAP POLY (MISCELLANEOUS) ×3 IMPLANT
WATER STERILE IRR 250ML POUR (IV SOLUTION) ×3 IMPLANT

## 2019-01-04 NOTE — Transfer of Care (Signed)
Immediate Anesthesia Transfer of Care Note  Patient: Ashley Gross  Procedure(s) Performed: COLONOSCOPY WITH PROPOFOL (N/A Rectum) POLYPECTOMY (Rectum)  Patient Location: PACU  Anesthesia Type: General  Level of Consciousness: awake, alert  and patient cooperative  Airway and Oxygen Therapy: Patient Spontanous Breathing and Patient connected to supplemental oxygen  Post-op Assessment: Post-op Vital signs reviewed, Patient's Cardiovascular Status Stable, Respiratory Function Stable, Patent Airway and No signs of Nausea or vomiting  Post-op Vital Signs: Reviewed and stable  Complications: No apparent anesthesia complications

## 2019-01-04 NOTE — H&P (Signed)
Cephas Darby, MD [address]  [place]  [place], [place] [address]  Main: [phone]  Fax: [phone] Pager: [phone]  Primary Care Physician:  Einar Pheasant, MD Primary Gastroenterologist:  Dr. Cephas Darby  Pre-Procedure History & Physical: HPI:  Ashley Gross is a 68 y.o. female is here for an colonoscopy.   Past Medical History:  Diagnosis Date  . Abnormal liver function test 10/06/2012  . Anemia, unspecified   . Asthma   . Colitis   . Complication of anesthesia    asthma attack after shoulder surgery  . Diabetes mellitus ([place])   . Esophagitis   . H/O hiatal hernia   . Heart murmur   . Hx of arteriovenous malformation (AVM)   . Hypertension   . IBS (irritable bowel syndrome)   . Nephrolithiasis   . Pernicious anemia   . Personal history of colonic polyps 02/10/2013   02/08/13 colonoscopy - question of rectal varices, two 3-69mm polyps in the sigmoid colon, mass at the hepatic flexure, one 26mm polyp at the hepatic flexure, nodule at the ileocecal valve, congested mucusa in the entire colon, flattened villi mucosa in the terminal ileum.    . Pneumonia   . Psoriatic arthritis (HCC)    s/p penicillamine, plaquenil, MTX, sulfasalazine.  s/p Embrel, Humira,.  Iritis.    . Pure hypercholesterolemia     Past Surgical History:  Procedure Laterality Date  . ABDOMINAL HYSTERECTOMY  1981   ovaries left in place  . ANKLE SURGERY     right  . BUNIONECTOMY    . CHOLECYSTECTOMY  1989  . COLONOSCOPY WITH PROPOFOL N/A 12/27/2017   Procedure: COLONOSCOPY WITH PROPOFOL;  Surgeon: Lin Landsman, MD;  Location: [facility] ENDOSCOPY;  Service: Gastroenterology;  Laterality: N/A;  . ESOPHAGOGASTRODUODENOSCOPY (EGD) WITH PROPOFOL N/A 12/27/2017   Procedure: ESOPHAGOGASTRODUODENOSCOPY (EGD) WITH PROPOFOL;  Surgeon: Lin Landsman, MD;  Location: [facility] ENDOSCOPY;  Service: Gastroenterology;  Laterality: N/A;  . HAND SURGERY     right  . HEEL SPUR SURGERY    . NOSE  SURGERY     x2  . SHOULDER ARTHROSCOPY WITH OPEN ROTATOR CUFF REPAIR Left 09/20/2018   Procedure: SHOULDER ARTHROSCOPY WITH OPEN ROTATOR CUFF REPAIR;  Surgeon: Corky Mull, MD;  Location: [facility] ORS;  Service: Orthopedics;  Laterality: Left;  . SHOULDER CLOSED REDUCTION Left 12/21/2018   Procedure: MANIPULATION UNDER ANESTHESIA WITH STEROID INJECTION;  Surgeon: Corky Mull, MD;  Location: [facility];  Service: Orthopedics;  Laterality: Left;  Diabetic - insulin and oral meds  . UMBILICAL HERNIA REPAIR      Prior to Admission medications   Medication Sig Start Date End Date Taking? Authorizing Provider  albuterol (ACCUNEB) 0.63 MG/3ML nebulizer solution Take 3 mLs (0.63 mg total) by nebulization every 6 (six) hours as needed for wheezing. 01/11/18  Yes Kordsmeier, Gregary Signs, FNP  amLODipine (NORVASC) 5 MG tablet TAKE 1 TABLET BY MOUTH TWICE DAILY 11/08/18  Yes Einar Pheasant, MD  Calcium Carbonate-Vit D-Min (CALCIUM 600+D PLUS MINERALS) 600-400 MG-UNIT TABS Take by mouth.   Yes [provider]  Cholecalciferol (VITAMIN D-3) 1000 UNITS CAPS Take 1 capsule by mouth daily.   Yes [provider]  dicyclomine (BENTYL) 20 MG tablet Take 1 tablet (20 mg total) by mouth 4 (four) times daily -  before meals and at bedtime for 30 days. 12/28/18 01/27/19 Yes , Tally Due, MD  diphenhydrAMINE (BENADRYL) 25 mg capsule Take by mouth.   Yes [provider]  Ergocalciferol (VITAMIN D2) 50 MCG (2000 UT) TABS Take by mouth daily.   Yes [provider]  Fluticasone-Salmeterol (ADVAIR DISKUS) 250-50 MCG/DOSE AEPB Inhale 1 puff into the lungs 2 (two) times daily. 08/24/18  Yes Einar Pheasant, MD  glipiZIDE (GLUCOTROL) 5 MG tablet Take 1 tablet (5 mg total) by mouth daily. 10/12/18  Yes Einar Pheasant, MD  insulin degludec (TRESIBA FLEXTOUCH) 100 UNIT/ML SOPN FlexTouch Pen Inject 0.2 mLs (20 Units total) into the skin daily. 10/21/18  Yes Einar Pheasant, MD  ipratropium  (ATROVENT HFA) 17 MCG/ACT inhaler Inhale 2 puffs into the lungs every 6 (six) hours.   Yes [provider]  loperamide (IMODIUM) 2 MG capsule Take by mouth.   Yes [provider]  losartan (COZAAR) 100 MG tablet TAKE 1 TABLET(100 MG) BY MOUTH DAILY 12/22/18  Yes Einar Pheasant, MD  omeprazole (PRILOSEC) 20 MG capsule TAKE 1 CAPSULE BY MOUTH ONCE DAILY 12/07/18  Yes Einar Pheasant, MD  pioglitazone (ACTOS) 15 MG tablet TAKE 1 TABLET BY MOUTH ONCE DAILY 12/07/18  Yes Einar Pheasant, MD  potassium chloride (K-DUR) 10 MEQ tablet TAKE 1 TABLET(10 MEQ) BY MOUTH TWICE DAILY 10/21/18  Yes Einar Pheasant, MD  rosuvastatin (CRESTOR) 10 MG tablet Take 1 tablet (10 mg total) by mouth daily. 12/29/17  Yes Einar Pheasant, MD  triamterene-hydrochlorothiazide (MAXZIDE-25) 37.5-25 MG tablet Take 0.5 tablets by mouth daily. 04/16/16  Yes Einar Pheasant, MD  erythromycin ophthalmic ointment  10/07/18   [provider]  Insulin Pen Needle (BD PEN NEEDLE NANO U/F) 32G X 4 MM MISC Use to inject insulin daily 07/25/18   Einar Pheasant, MD  Iron-FA-B Cmp-C-Biot-Probiotic (FUSION PLUS) CAPS Take 1 capsule by mouth daily for 30 days. 12/28/18 01/27/19  Lin Landsman, MD  ONE TOUCH ULTRA TEST test strip PATIENT NEEDS NEW METER STRIPS AND LANCETS FOR ONE TOUCH. HER INSURANCE NO LONGER COVERS ACCUCHEK. 07/31/15   Einar Pheasant, MD    Allergies as of 12/23/2018 - Review Complete 12/21/2018  Allergen Reaction Noted  . Aspirin Other (See Comments) 10/06/2012  . Beta adrenergic blockers  10/06/2012  . Lisinopril  12/21/2018  . Mtx support [cobalamine combinations] Other (See Comments) 10/06/2012  . Vasotec [enalapril] Cough 10/06/2012  . Verapamil Other (See Comments) 10/06/2012  . Voltaren [diclofenac sodium]  10/06/2012  . Erythromycin Other (See Comments) 10/06/2012  . Indocin [indomethacin]  10/06/2012    Family History  Problem Relation Age of Onset  . Diabetes Other   . Hypertension  Other   . Breast cancer Neg Hx   . Colon cancer Neg Hx     Social History   Socioeconomic History  . Marital status: Married    Spouse name: Not on file  . Number of children: 2  . Years of education: Not on file  . Highest education level: Not on file  Occupational History  . Not on file  Social Needs  . Financial resource strain: Not on file  . Food insecurity:    Worry: Not on file    Inability: Not on file  . Transportation needs:    Medical: Not on file    Non-medical: Not on file  Tobacco Use  . Smoking status: Never Smoker  . Smokeless tobacco: Never Used  Substance and Sexual Activity  . Alcohol use: No    Alcohol/week: 0.0 standard drinks  . Drug use: No  . Sexual activity: Not Currently  Lifestyle  . Physical activity:    Days per week: Not on  file    Minutes per session: Not on file  . Stress: Not on file  Relationships  . Social connections:    Talks on phone: Not on file    Gets together: Not on file    Attends religious service: Not on file    Active member of club or organization: Not on file    Attends meetings of clubs or organizations: Not on file    Relationship status: Not on file  . Intimate partner violence:    Fear of current or ex partner: Not on file    Emotionally abused: Not on file    Physically abused: Not on file    Forced sexual activity: Not on file  Other Topics Concern  . Not on file  Social History Narrative   She is married and has two children    Review of Systems: See HPI, otherwise negative ROS  Physical Exam: BP 126/67   Pulse 78   Temp 97.9 F (36.6 C) (Temporal)   Resp 16   Ht 5\' 4"  (1.626 m)   Wt 81.6 kg   SpO2 96%   BMI 30.90 kg/m  General:   Alert,  pleasant and cooperative in NAD Head:  Normocephalic and atraumatic. Neck:  Supple; no masses or thyromegaly. Lungs:  Clear throughout to auscultation.    Heart:  Regular rate and rhythm. Abdomen:  Soft, nontender and nondistended. Normal bowel sounds,  without guarding, and without rebound.   Neurologic:  Alert and  oriented x4;  grossly normal neurologically.  Impression/Plan: Ashley Gross is here for an colonoscopy to be performed for h/o inflammatory colon polyps  Risks, benefits, limitations, and alternatives regarding  colonoscopy have been reviewed with the patient.  Questions have been answered.  All parties agreeable.   Sherri Sear, MD  01/04/2019, 7:38 AM

## 2019-01-04 NOTE — Anesthesia Postprocedure Evaluation (Signed)
Anesthesia Post Note  Patient: MURL ZOGG  Procedure(s) Performed: COLONOSCOPY WITH PROPOFOL (N/A Rectum) POLYPECTOMY (Rectum)  Patient location during evaluation: PACU Anesthesia Type: General Level of consciousness: awake and alert Pain management: pain level controlled Vital Signs Assessment: post-procedure vital signs reviewed and stable Respiratory status: spontaneous breathing Cardiovascular status: stable Anesthetic complications: no    Fernado Brigante, III,  Ahmya Bernick D

## 2019-01-04 NOTE — Op Note (Signed)
[facility] Gastroenterology Patient Name: Ashley Gross Procedure Date: 01/04/2019 7:39 AM MRN: [id number] Account #: [id number] Date of Birth: 1951/07/23 Admit Type: Outpatient Age: 68 Room: [facility] OR ROOM 01 Gender: Female Note Status: Finalized Procedure:            Colonoscopy Indications:          Last colonoscopy: February 2019, Inflammatory polyps in                        the colon, For therapy of inflammatory polyps in the                        colon, chronic diarrhea and rectal bleeding Providers:            Lin Landsman MD, MD Referring MD:         Einar Pheasant, MD (Referring MD) Medicines:            General Anesthesia Complications:        No immediate complications. Estimated blood loss:                        Minimal. Procedure:            Pre-Anesthesia Assessment:                       - Prior to the procedure, a History and Physical was                        performed, and patient medications and allergies were                        reviewed. The patient is competent. The risks and                        benefits of the procedure and the sedation options and                        risks were discussed with the patient. All questions                        were answered and informed consent was obtained.                        Patient identification and proposed procedure were                        verified by the physician, the nurse, the                        anesthesiologist, the anesthetist and the technician in                        the pre-procedure area in the procedure room in the                        endoscopy suite. Mental Status Examination: alert and                        oriented. Airway Examination: normal oropharyngeal  airway and neck mobility. Respiratory Examination:                        clear to auscultation. CV Examination: normal.                        Prophylactic Antibiotics: The  patient does not require                        prophylactic antibiotics. Prior Anticoagulants: The                        patient has taken no previous anticoagulant or                        antiplatelet agents. ASA Grade Assessment: II - A                        patient with mild systemic disease. After reviewing the                        risks and benefits, the patient was deemed in                        satisfactory condition to undergo the procedure. The                        anesthesia plan was to use general anesthesia.                        Immediately prior to administration of medications, the                        patient was re-assessed for adequacy to receive                        sedatives. The heart rate, respiratory rate, oxygen                        saturations, blood pressure, adequacy of pulmonary                        ventilation, and response to care were monitored                        throughout the procedure. The physical status of the                        patient was re-assessed after the procedure.                       After obtaining informed consent, the colonoscope was                        passed under direct vision. Throughout the procedure,                        the patient's blood pressure, pulse, and oxygen                        saturations were  monitored continuously. The was                        introduced through the anus and advanced to the the                        terminal ileum, with identification of the appendiceal                        orifice and IC valve. The colonoscopy was performed                        without difficulty. The patient tolerated the procedure                        well. The quality of the bowel preparation was good.                        The quality of the bowel preparation was evaluated                        using the BBPS [facility] Bowel Preparation Scale) with                        scores of: Right  Colon = 3, Transverse Colon = 3 and                        Left Colon = 3 (entire mucosa seen well with no                        residual staining, small fragments of stool or opaque                        liquid). The total BBPS score equals 9. Findings:      Hemorrhoids were found on perianal exam.      The terminal ileum appeared normal.      Multiple inflammtory pedunculated and semi-pedunculated, recently       bleeding polyps (based on stigmata of bleeding) were found in the       sigmoid colon and descending colon. The polyps were 10 to 40 mm in size.       These polyps were removed with a hot snare. Polyp resection was       complete, majorty of the polyps were retrieved wth roth net and had the       patient defecate the polyps in a stool specimen collection hat. Impression:           - Hemorrhoids found on perianal exam.                       - The examined portion of the ileum was normal.                       - Multiple 10 to 30 mm, recently bleeding polyps in the                        sigmoid colon and in the descending colon, removed with  a hot snare. Polyp resection was incomplete, and the                        resected tissue was partially retrieved. Recommendation:       - Discharge patient to home (with escort).                       - Clear liquid diet today.                       - Continue present medications.                       - Await pathology results.                       - Return to my office as previously scheduled.                       - Plan for entivyo                       - Expect some rectal bleeding Procedure Code(s):    --- Professional ---                       [phone], Colonoscopy, flexible; with removal of tumor(s),                        polyp(s), or other lesion(s) by snare technique Diagnosis Code(s):    --- Professional ---                       K64.9, Unspecified hemorrhoids                       D12.5, Benign  neoplasm of sigmoid colon                       D12.4, Benign neoplasm of descending colon                       K51.40, Inflammatory polyps of colon without                        complications CPT copyright 2018 American Medical Association. All rights reserved. The codes documented in this report are preliminary and upon coder review may  be revised to meet current compliance requirements. Dr. Ulyess Mort Lin Landsman MD, MD 01/04/2019 9:55:47 AM This report has been signed electronically. Number of Addenda: 0 Note Initiated On: 01/04/2019 7:39 AM Scope Withdrawal Time: 1 hour 31 minutes 1 second  Total Procedure Duration: 1 hour 34 minutes 35 seconds       [facility]

## 2019-01-04 NOTE — Anesthesia Procedure Notes (Signed)
Procedure Name: LMA Insertion Date/Time: 01/04/2019 8:12 AM Performed by: Janna Arch, CRNA Pre-anesthesia Checklist: Patient identified, Emergency Drugs available, Suction available, Timeout performed and Patient being monitored Patient Re-evaluated:Patient Re-evaluated prior to induction Oxygen Delivery Method: Circle system utilized Preoxygenation: Pre-oxygenation with 100% oxygen Induction Type: IV induction LMA: LMA inserted LMA Size: 4.0 Number of attempts: 1 Placement Confirmation: positive ETCO2 and breath sounds checked- equal and bilateral Tube secured with: Tape

## 2019-01-04 NOTE — Anesthesia Preprocedure Evaluation (Signed)
Anesthesia Evaluation  Patient identified by MRN, date of birth, ID band Patient awake    Reviewed: Allergy & Precautions, H&P , NPO status , Patient's Chart, lab work & pertinent test results  History of Anesthesia Complications Negative for: history of anesthetic complications  Airway Mallampati: II  TM Distance: >3 FB Neck ROM: full    Dental no notable dental hx.    Pulmonary asthma ,    Pulmonary exam normal breath sounds clear to auscultation       Cardiovascular hypertension, Normal cardiovascular exam Rhythm:regular Rate:Normal     Neuro/Psych negative neurological ROS     GI/Hepatic Neg liver ROS, hiatal hernia, Medicated,  Endo/Other  diabetes, Well Controlled, Type 2, Insulin Dependent  Renal/GU   negative genitourinary   Musculoskeletal   Abdominal   Peds  Hematology   Anesthesia Other Findings   Reproductive/Obstetrics                             Anesthesia Physical Anesthesia Plan  ASA: II  Anesthesia Plan: General   Post-op Pain Management:    Induction:   PONV Risk Score and Plan:   Airway Management Planned:   Additional Equipment:   Intra-op Plan:   Post-operative Plan:   Informed Consent: I have reviewed the patients History and Physical, chart, labs and discussed the procedure including the risks, benefits and alternatives for the proposed anesthesia with the patient or authorized representative who has indicated his/her understanding and acceptance.       Plan Discussed with:   Anesthesia Plan Comments:         Anesthesia Quick Evaluation

## 2019-01-04 NOTE — Anesthesia Procedure Notes (Signed)
Procedure Name: MAC Date/Time: 01/04/2019 7:55 AM Performed by: Janna Arch, CRNA Pre-anesthesia Checklist: Patient identified, Emergency Drugs available, Suction available and Patient being monitored Oxygen Delivery Method: Nasal cannula

## 2019-01-05 ENCOUNTER — Encounter: Payer: Self-pay | Admitting: Gastroenterology

## 2019-01-05 DIAGNOSIS — K635 Polyp of colon: Secondary | ICD-10-CM | POA: Diagnosis not present

## 2019-01-06 ENCOUNTER — Encounter: Payer: Self-pay | Admitting: Gastroenterology

## 2019-01-06 DIAGNOSIS — M25612 Stiffness of left shoulder, not elsewhere classified: Secondary | ICD-10-CM | POA: Diagnosis not present

## 2019-01-06 DIAGNOSIS — Z9889 Other specified postprocedural states: Secondary | ICD-10-CM | POA: Diagnosis not present

## 2019-01-06 DIAGNOSIS — M6281 Muscle weakness (generalized): Secondary | ICD-10-CM | POA: Diagnosis not present

## 2019-01-10 DIAGNOSIS — M6281 Muscle weakness (generalized): Secondary | ICD-10-CM | POA: Diagnosis not present

## 2019-01-10 DIAGNOSIS — M25512 Pain in left shoulder: Secondary | ICD-10-CM | POA: Diagnosis not present

## 2019-01-10 DIAGNOSIS — M25612 Stiffness of left shoulder, not elsewhere classified: Secondary | ICD-10-CM | POA: Diagnosis not present

## 2019-01-10 DIAGNOSIS — Z9889 Other specified postprocedural states: Secondary | ICD-10-CM | POA: Diagnosis not present

## 2019-01-12 ENCOUNTER — Ambulatory Visit: Payer: Self-pay

## 2019-01-17 DIAGNOSIS — M25512 Pain in left shoulder: Secondary | ICD-10-CM | POA: Diagnosis not present

## 2019-01-17 DIAGNOSIS — M25612 Stiffness of left shoulder, not elsewhere classified: Secondary | ICD-10-CM | POA: Diagnosis not present

## 2019-01-17 DIAGNOSIS — Z9889 Other specified postprocedural states: Secondary | ICD-10-CM | POA: Diagnosis not present

## 2019-01-17 DIAGNOSIS — M6281 Muscle weakness (generalized): Secondary | ICD-10-CM | POA: Diagnosis not present

## 2019-02-06 ENCOUNTER — Encounter: Payer: Self-pay | Admitting: Internal Medicine

## 2019-02-06 ENCOUNTER — Ambulatory Visit (INDEPENDENT_AMBULATORY_CARE_PROVIDER_SITE_OTHER): Payer: PPO | Admitting: Internal Medicine

## 2019-02-06 DIAGNOSIS — L4050 Arthropathic psoriasis, unspecified: Secondary | ICD-10-CM

## 2019-02-06 DIAGNOSIS — E7800 Pure hypercholesterolemia, unspecified: Secondary | ICD-10-CM | POA: Diagnosis not present

## 2019-02-06 DIAGNOSIS — M7502 Adhesive capsulitis of left shoulder: Secondary | ICD-10-CM | POA: Diagnosis not present

## 2019-02-06 DIAGNOSIS — K5140 Inflammatory polyps of colon without complications: Secondary | ICD-10-CM | POA: Diagnosis not present

## 2019-02-06 DIAGNOSIS — E611 Iron deficiency: Secondary | ICD-10-CM | POA: Diagnosis not present

## 2019-02-06 DIAGNOSIS — K648 Other hemorrhoids: Secondary | ICD-10-CM | POA: Diagnosis not present

## 2019-02-06 DIAGNOSIS — I10 Essential (primary) hypertension: Secondary | ICD-10-CM

## 2019-02-06 DIAGNOSIS — R0981 Nasal congestion: Secondary | ICD-10-CM

## 2019-02-06 DIAGNOSIS — E1121 Type 2 diabetes mellitus with diabetic nephropathy: Secondary | ICD-10-CM | POA: Diagnosis not present

## 2019-02-06 DIAGNOSIS — E538 Deficiency of other specified B group vitamins: Secondary | ICD-10-CM

## 2019-02-06 DIAGNOSIS — K625 Hemorrhage of anus and rectum: Secondary | ICD-10-CM | POA: Diagnosis not present

## 2019-02-06 DIAGNOSIS — K529 Noninfective gastroenteritis and colitis, unspecified: Secondary | ICD-10-CM | POA: Diagnosis not present

## 2019-02-06 DIAGNOSIS — K219 Gastro-esophageal reflux disease without esophagitis: Secondary | ICD-10-CM

## 2019-02-06 MED ORDER — AMLODIPINE BESYLATE 5 MG PO TABS: 5.0000 mg | ORAL_TABLET | Freq: Two times a day (BID) | ORAL | 1 refills | Status: DC

## 2019-02-06 MED ORDER — PIOGLITAZONE HCL 15 MG PO TABS: 15.0000 mg | ORAL_TABLET | Freq: Every day | ORAL | 1 refills | Status: DC

## 2019-02-06 MED ORDER — TRIAMTERENE-HCTZ 37.5-25 MG PO TABS: 0.5000 | ORAL_TABLET | Freq: Every day | ORAL | 1 refills | Status: DC

## 2019-02-06 MED ORDER — POTASSIUM CHLORIDE ER 10 MEQ PO TBCR: EXTENDED_RELEASE_TABLET | ORAL | 1 refills | Status: DC

## 2019-02-06 MED ORDER — LOSARTAN POTASSIUM 100 MG PO TABS: ORAL_TABLET | ORAL | 1 refills | Status: DC

## 2019-02-06 MED ORDER — OMEPRAZOLE 20 MG PO CPDR: 20.0000 mg | DELAYED_RELEASE_CAPSULE | Freq: Two times a day (BID) | ORAL | 1 refills | Status: DC

## 2019-02-06 MED ORDER — GLIPIZIDE 5 MG PO TABS: 5.0000 mg | ORAL_TABLET | Freq: Every day | ORAL | 1 refills | Status: DC

## 2019-02-06 MED ORDER — ROSUVASTATIN CALCIUM 10 MG PO TABS: 10.0000 mg | ORAL_TABLET | Freq: Every day | ORAL | 1 refills | Status: DC

## 2019-02-06 NOTE — Progress Notes (Addendum)
Patient ID: Ashley Gross, female   DOB: 04-04-1951, 68 y.o.   MRN: [id number] Virtual Visit via Video Note  I connected with Candyce Churn on 02/06/19 at 10:30 AM EDT by a video enabled telemedicine application and verified that I am speaking with the correct person using two identifiers.  Location patient: home Location provider:work Persons participating in the virtual visit: patient, provider  I discussed the limitations of evaluation and management by telemedicine.  This visit type was conducted due to national recommendations for restrictions regarding the COVID-19 pandemic.  This format is felt to be most appropriate for this patient at this time.  The patient expressed understanding and agreed to proceed.   HPI: Scheduled for a follow up visit.  She reports she is doing relatively well.  Is s/p shoulder surgery and subsequent shoulder manipulation.  Had been doing physical therapy.  Doing exercises at home now.  Also s/p eye surgery.  No tearing now.  She has been checking her sugars.  States her am sugars are averaging 120s.  Two hours after eating - 130.  No low sugars.  Discussed diet and exercise.  No chest pain.  Does report having issues with her glands being sore.  Some cough with previous tightness.  Using her inhaler.  Drainage. No sob.  Is better.  Cough is dry.  No chest congestion.  Using flonase.  No acid reflux.  No abdominal pain.  Has seen GI.  S/p hemorrhoid ligation.  Started on oral iron.  Had colonoscopy.  Decreased bleeding.  Decreased diarrhea.  Receiving B12 injections.     ROS: See pertinent positives and negatives per HPI.  Past Medical History:  Diagnosis Date  . Abnormal liver function test 10/06/2012  . Anemia, unspecified   . Asthma   . Colitis   . Complication of anesthesia    asthma attack after shoulder surgery  . Diabetes mellitus ([place])   . Esophagitis   . H/O hiatal hernia   . Heart murmur   . Hx of arteriovenous malformation (AVM)   .  Hypertension   . IBS (irritable bowel syndrome)   . Nephrolithiasis   . Pernicious anemia   . Personal history of colonic polyps 02/10/2013   02/08/13 colonoscopy - question of rectal varices, two 3-52mm polyps in the sigmoid colon, mass at the hepatic flexure, one 12mm polyp at the hepatic flexure, nodule at the ileocecal valve, congested mucusa in the entire colon, flattened villi mucosa in the terminal ileum.    . Pneumonia   . Psoriatic arthritis (HCC)    s/p penicillamine, plaquenil, MTX, sulfasalazine.  s/p Embrel, Humira,.  Iritis.    . Pure hypercholesterolemia     Past Surgical History:  Procedure Laterality Date  . ABDOMINAL HYSTERECTOMY  1981   ovaries left in place  . ANKLE SURGERY     right  . BUNIONECTOMY    . CHOLECYSTECTOMY  1989  . COLONOSCOPY WITH PROPOFOL N/A 12/27/2017   Procedure: COLONOSCOPY WITH PROPOFOL;  Surgeon: Lin Landsman, MD;  Location: [facility] END[facility]COPY;  Service: Gastroenterology;  Laterality: N/A;  . COLONOSCOPY WITH PROPOFOL N/A 01/04/2019   Procedure: COLONOSCOPY WITH PROPOFOL;  Surgeon: Lin Landsman, MD;  Location: [place];  Service: Endoscopy;  Laterality: N/A;  Diabetic - oral and injectable  . ESOPHAGOGASTRODUODENOSCOPY (EGD) WITH PROPOFOL N/A 12/27/2017   Procedure: ESOPHAGOGASTRODUODENOSCOPY (EGD) WITH PROPOFOL;  Surgeon: Lin Landsman, MD;  Location: [place];  Service: Gastroenterology;  Laterality: N/A;  . HAND SURGERY  right  . HEEL SPUR SURGERY    . NOSE SURGERY     x2  . POLYPECTOMY  01/04/2019   Procedure: POLYPECTOMY;  Surgeon: Lin Landsman, MD;  Location: [place];  Service: Endoscopy;;  . SHOULDER ARTHROSCOPY WITH OPEN ROTATOR CUFF REPAIR Left 09/20/2018   Procedure: SHOULDER ARTHROSCOPY WITH OPEN ROTATOR CUFF REPAIR;  Surgeon: Corky Mull, MD;  Location: [facility];  Service: Orthopedics;  Laterality: Left;  . SHOULDER CLOSED REDUCTION Left 12/21/2018   Procedure: MANIPULATION UNDER  ANESTHESIA WITH STEROID INJECTION;  Surgeon: Corky Mull, MD;  Location: [place];  Service: Orthopedics;  Laterality: Left;  Diabetic - insulin and oral meds  . UMBILICAL HERNIA REPAIR      Family History  Problem Relation Age of Onset  . Diabetes Other   . Hypertension Other   . Breast cancer Neg Hx   . Colon cancer Neg Hx     SOCIAL HX: reviewed.    Current Outpatient Medications:  .  albuterol (ACCUNEB) 0.63 MG/3ML nebulizer solution, Take 3 mLs (0.63 mg total) by nebulization every 6 (six) hours as needed for wheezing., Disp: 75 mL, Rfl: 1 .  amLODipine (NORVASC) 5 MG tablet, Take 1 tablet (5 mg total) by mouth 2 (two) times daily., Disp: 180 tablet, Rfl: 1 .  Calcium Carbonate-Vit D-Min (CALCIUM 600+D PLUS MINERALS) 600-400 MG-UNIT TABS, Take by mouth., Disp: , Rfl:  .  Cholecalciferol (VITAMIN D-3) 1000 UNITS CAPS, Take 1 capsule by mouth daily., Disp: , Rfl:  .  dicyclomine (BENTYL) 20 MG tablet, Take 1 tablet (20 mg total) by mouth 4 (four) times daily -  before meals and at bedtime for 30 days., Disp: 120 tablet, Rfl: 0 .  diphenhydrAMINE (BENADRYL) 25 mg capsule, Take by mouth., Disp: , Rfl:  .  Ergocalciferol (VITAMIN D2) 50 MCG (2000 UT) TABS, Take by mouth daily., Disp: , Rfl:  .  erythromycin ophthalmic ointment, , Disp: , Rfl:  .  Fluticasone-Salmeterol (ADVAIR DISKUS) 250-50 MCG/DOSE AEPB, Inhale 1 puff into the lungs 2 (two) times daily., Disp: 180 each, Rfl: 1 .  glipiZIDE (GLUCOTROL) 5 MG tablet, Take 1 tablet (5 mg total) by mouth daily., Disp: 90 tablet, Rfl: 1 .  insulin degludec (TRESIBA FLEXTOUCH) 100 UNIT/ML SOPN FlexTouch Pen, Inject 0.2 mLs (20 Units total) into the skin daily., Disp: 12 mL, Rfl: 2 .  Insulin Pen Needle (BD PEN NEEDLE NANO U/F) 32G X 4 MM MISC, Use to inject insulin daily, Disp: 100 each, Rfl: 3 .  ipratropium (ATROVENT HFA) 17 MCG/ACT inhaler, Inhale 2 puffs into the lungs every 6 (six) hours., Disp: , Rfl:  .  loperamide  (IMODIUM) 2 MG capsule, Take by mouth., Disp: , Rfl:  .  losartan (COZAAR) 100 MG tablet, TAKE 1 TABLET(100 MG) BY MOUTH DAILY, Disp: 90 tablet, Rfl: 1 .  omeprazole (PRILOSEC) 20 MG capsule, Take 1 capsule (20 mg total) by mouth 2 (two) times daily., Disp: 180 capsule, Rfl: 1 .  ONE TOUCH ULTRA TEST test strip, PATIENT NEEDS NEW METER STRIPS AND LANCETS FOR ONE TOUCH. HER INSURANCE NO LONGER COVERS ACCUCHEK., Disp: 100 each, Rfl: PRN .  pioglitazone (ACTOS) 15 MG tablet, Take 1 tablet (15 mg total) by mouth daily., Disp: 90 tablet, Rfl: 1 .  potassium chloride (K-DUR) 10 MEQ tablet, TAKE 1 TABLET(10 MEQ) BY MOUTH TWICE DAILY, Disp: 180 tablet, Rfl: 1 .  rosuvastatin (CRESTOR) 10 MG tablet, Take 1 tablet (10 mg total) by mouth  daily., Disp: 90 tablet, Rfl: 1 .  triamterene-hydrochlorothiazide (MAXZIDE-25) 37.5-25 MG tablet, Take 0.5 tablets by mouth daily., Disp: 45 tablet, Rfl: 1  EXAM:  VITALS per patient if applicable:  250/03 (checked by pt)  GENERAL: alert, oriented, appears well and in no acute distress  HEENT: atraumatic, conjunttiva clear, no obvious abnormalities on inspection of external nose and ears  NECK: normal movements of the head and neck  LUNGS: on inspection no signs of respiratory distress, breathing rate appears normal, no obvious gross SOB, gasping or wheezing  CV: no obvious cyanosis  PSYCH/NEURO: pleasant and cooperative, no obvious depression or anxiety, speech and thought processing grossly intact  ASSESSMENT AND PLAN:  Discussed the following assessment and plan:  Iron deficiency - Plan: CBC with Differential/Platelet, Ferritin  Type 2 diabetes mellitus with diabetic nephropathy, without long-term current use of insulin ([place]) - Plan: Hemoglobin B0W, Basic metabolic panel  Adhesive capsulitis of left shoulder  Bleeding internal hemorrhoids  Chronic diarrhea  Gastroesophageal reflux disease, esophagitis presence not specified  Hypercholesterolemia -  Plan: Hepatic function panel, Lipid panel  Essential hypertension  Pseudopolyposis of colon, unspecified complication status, unspecified part of colon (HCC)  Psoriatic arthritis ([place])  Rectal bleeding  Sinus congestion  B12 deficiency     I discussed the assessment and treatment plan with the patient. The patient was provided an opportunity to ask questions and all were answered. The patient agreed with the plan and demonstrated an understanding of the instructions.   The patient was advised to call back or seek an in-person evaluation if the symptoms worsen or if the condition fails to improve as anticipated.    Einar Pheasant, MD

## 2019-02-11 ENCOUNTER — Encounter: Payer: Self-pay | Admitting: Internal Medicine

## 2019-02-11 DIAGNOSIS — E538 Deficiency of other specified B group vitamins: Secondary | ICD-10-CM | POA: Insufficient documentation

## 2019-02-11 DIAGNOSIS — R0981 Nasal congestion: Secondary | ICD-10-CM | POA: Insufficient documentation

## 2019-02-11 HISTORY — DX: Deficiency of other specified B group vitamins: E53.8

## 2019-02-11 NOTE — Assessment & Plan Note (Signed)
Continue b12 injections.  

## 2019-02-11 NOTE — Assessment & Plan Note (Signed)
S/p recent colonoscopy.  Multiple polyps removed.  In process of trying to get approval for entivyo.

## 2019-02-11 NOTE — Assessment & Plan Note (Signed)
On crestor.  Low cholesterol diet and exercise.  Follow lipid panel and liver function tests.   

## 2019-02-11 NOTE — Assessment & Plan Note (Signed)
Symptoms as outlined.  Flonase, mucinex/robitussin as directed.  Continue inhalers.  Follow.

## 2019-02-11 NOTE — Assessment & Plan Note (Signed)
S/p colonoscopy with removal of multiple polyps as outlined.  S/p hemorrhoid ligation.  Decreased bleeding.

## 2019-02-11 NOTE — Assessment & Plan Note (Signed)
Followed by Dr Kernodle.  Stable.  

## 2019-02-11 NOTE — Assessment & Plan Note (Signed)
S/p shoulder surgery and then s/p subsequent shoulder manipulation.  Therapy.  Continue exercise.

## 2019-02-11 NOTE — Assessment & Plan Note (Signed)
Saw Dr Marius Ditch.  Recent colonoscopy.  On oral iron.  Decreased diarrhea and decreased bleeding recently.  Follow.

## 2019-02-11 NOTE — Assessment & Plan Note (Signed)
Controlled.  No acid reflux reported.

## 2019-02-11 NOTE — Assessment & Plan Note (Signed)
S/p ligation.

## 2019-02-11 NOTE — Assessment & Plan Note (Signed)
Blood pressure under good control.  Continue same medication regimen.  Follow pressures.  Follow metabolic panel.   

## 2019-02-14 ENCOUNTER — Ambulatory Visit (INDEPENDENT_AMBULATORY_CARE_PROVIDER_SITE_OTHER): Payer: PPO

## 2019-02-14 ENCOUNTER — Other Ambulatory Visit: Payer: Self-pay

## 2019-02-14 DIAGNOSIS — E538 Deficiency of other specified B group vitamins: Secondary | ICD-10-CM

## 2019-02-14 MED ORDER — CYANOCOBALAMIN 1000 MCG/ML IJ SOLN
1000.0000 ug | Freq: Once | INTRAMUSCULAR | Status: AC
  Administered 2019-02-14: 1000 ug via INTRAMUSCULAR

## 2019-02-14 NOTE — Progress Notes (Addendum)
Patient presented today for B-12 injection.  Administered IM in left deltoid.  Patient tolerated well.    Reviewed.  Dr Nicki Reaper

## 2019-03-16 DIAGNOSIS — B351 Tinea unguium: Secondary | ICD-10-CM | POA: Diagnosis not present

## 2019-03-16 DIAGNOSIS — M79674 Pain in right toe(s): Secondary | ICD-10-CM | POA: Diagnosis not present

## 2019-03-16 DIAGNOSIS — M79675 Pain in left toe(s): Secondary | ICD-10-CM | POA: Diagnosis not present

## 2019-03-21 ENCOUNTER — Ambulatory Visit: Payer: Self-pay

## 2019-03-23 ENCOUNTER — Encounter: Payer: Self-pay | Admitting: Gastroenterology

## 2019-04-13 ENCOUNTER — Other Ambulatory Visit: Payer: Self-pay

## 2019-04-13 ENCOUNTER — Ambulatory Visit (INDEPENDENT_AMBULATORY_CARE_PROVIDER_SITE_OTHER): Payer: PPO

## 2019-04-13 DIAGNOSIS — E538 Deficiency of other specified B group vitamins: Secondary | ICD-10-CM | POA: Diagnosis not present

## 2019-04-13 MED ORDER — CYANOCOBALAMIN 1000 MCG/ML IJ SOLN
1000.0000 ug | Freq: Once | INTRAMUSCULAR | Status: AC
  Administered 2019-04-13: 1000 ug via INTRAMUSCULAR

## 2019-04-13 NOTE — Progress Notes (Addendum)
Patient presented today for B12 injection.  Administered IM in right deltoid.  Patient tolerated well with no signs of distress.  Reviewed.  Dr Scott    

## 2019-04-19 ENCOUNTER — Other Ambulatory Visit (INDEPENDENT_AMBULATORY_CARE_PROVIDER_SITE_OTHER): Payer: PPO

## 2019-04-19 ENCOUNTER — Other Ambulatory Visit: Payer: Self-pay

## 2019-04-19 DIAGNOSIS — E611 Iron deficiency: Secondary | ICD-10-CM | POA: Diagnosis not present

## 2019-04-19 DIAGNOSIS — E7800 Pure hypercholesterolemia, unspecified: Secondary | ICD-10-CM | POA: Diagnosis not present

## 2019-04-19 DIAGNOSIS — E1121 Type 2 diabetes mellitus with diabetic nephropathy: Secondary | ICD-10-CM

## 2019-04-19 LAB — BASIC METABOLIC PANEL
BUN: 24 mg/dL — ABNORMAL HIGH (ref 6–23)
CO2: 24 mEq/L (ref 19–32)
Calcium: 8.8 mg/dL (ref 8.4–10.5)
Chloride: 105 mEq/L (ref 96–112)
Creatinine, Ser: 1.13 mg/dL (ref 0.40–1.20)
GFR: 47.85 mL/min — ABNORMAL LOW (ref >60.00)
Glucose, Bld: 159 mg/dL — ABNORMAL HIGH (ref 70–99)
Potassium: 3.6 mEq/L (ref 3.5–5.1)
Sodium: 139 mEq/L (ref 135–145)

## 2019-04-19 LAB — HEPATIC FUNCTION PANEL
ALT: 18 U/L (ref 0–35)
AST: 23 U/L (ref 0–37)
Albumin: 4 g/dL (ref 3.5–5.2)
Alkaline Phosphatase: 92 U/L (ref 39–117)
Bilirubin, Direct: 0.1 mg/dL (ref 0.0–0.3)
Total Bilirubin: 0.5 mg/dL (ref 0.2–1.2)
Total Protein: 6.2 g/dL (ref 6.0–8.3)

## 2019-04-19 LAB — HEMOGLOBIN A1C: Hgb A1c MFr Bld: 7.5 % — ABNORMAL HIGH (ref 4.6–6.5)

## 2019-04-19 LAB — LIPID PANEL
Cholesterol: 169 mg/dL (ref 0–200)
HDL: 58.3 mg/dL (ref >39.00)
NonHDL: 110.53
Total CHOL/HDL Ratio: 3
Triglycerides: 216 mg/dL — ABNORMAL HIGH (ref 0.0–149.0)
VLDL: 43.2 mg/dL — ABNORMAL HIGH (ref 0.0–40.0)

## 2019-04-19 LAB — CBC WITH DIFFERENTIAL/PLATELET
Basophils Absolute: 0.1 10*3/uL (ref 0.0–0.1)
Basophils Relative: 0.9 % (ref 0.0–3.0)
Eosinophils Absolute: 0.1 10*3/uL (ref 0.0–0.7)
Eosinophils Relative: 2 % (ref 0.0–5.0)
HCT: 39.2 % (ref 36.0–46.0)
Hemoglobin: 12.9 g/dL (ref 12.0–15.0)
Lymphocytes Relative: 27 % (ref 12.0–46.0)
Lymphs Abs: 1.6 10*3/uL (ref 0.7–4.0)
MCHC: 32.8 g/dL (ref 30.0–36.0)
MCV: 91.4 fl (ref 78.0–100.0)
Monocytes Absolute: 0.4 10*3/uL (ref 0.1–1.0)
Monocytes Relative: 7.2 % (ref 3.0–12.0)
Neutro Abs: 3.8 10*3/uL (ref 1.4–7.7)
Neutrophils Relative %: 62.9 % (ref 43.0–77.0)
Platelets: 240 10*3/uL (ref 150.0–400.0)
RBC: 4.29 Mil/uL (ref 3.87–5.11)
RDW: 14.4 % (ref 11.5–15.5)
WBC: 6 10*3/uL (ref 4.0–10.5)

## 2019-04-19 LAB — FERRITIN: Ferritin: 29.8 ng/mL (ref 10.0–291.0)

## 2019-04-19 LAB — LDL CHOLESTEROL, DIRECT: Direct LDL: 74 mg/dL

## 2019-05-03 ENCOUNTER — Other Ambulatory Visit: Payer: PPO

## 2019-05-16 ENCOUNTER — Ambulatory Visit (INDEPENDENT_AMBULATORY_CARE_PROVIDER_SITE_OTHER): Payer: PPO

## 2019-05-16 ENCOUNTER — Other Ambulatory Visit: Payer: Self-pay

## 2019-05-16 DIAGNOSIS — E538 Deficiency of other specified B group vitamins: Secondary | ICD-10-CM | POA: Diagnosis not present

## 2019-05-16 MED ORDER — CYANOCOBALAMIN 1000 MCG/ML IJ SOLN
1000.0000 ug | Freq: Once | INTRAMUSCULAR | Status: AC
  Administered 2019-05-16: 1000 ug via INTRAMUSCULAR

## 2019-05-16 NOTE — Progress Notes (Signed)
Patient presented today for B12 injection.  Administered IM in right deltoid per pt preference.  Patient tolerated well with no signs of distress.

## 2019-05-19 ENCOUNTER — Telehealth: Payer: Self-pay | Admitting: Internal Medicine

## 2019-05-19 MED ORDER — TRIAMTERENE-HCTZ 37.5-25 MG PO TABS: 0.5000 | ORAL_TABLET | Freq: Every day | ORAL | 1 refills | Status: DC

## 2019-05-19 NOTE — Telephone Encounter (Signed)
Pharmacy did not receive RX for triamterene-hydrochlorothiazide (MAXZIDE-25) 37.5-25 MG tablet [[id number]]    Please resend.

## 2019-05-19 NOTE — Telephone Encounter (Signed)
Medication has been filled. Script was sent initially no print resent normal.

## 2019-06-20 ENCOUNTER — Ambulatory Visit (INDEPENDENT_AMBULATORY_CARE_PROVIDER_SITE_OTHER): Payer: PPO

## 2019-06-20 ENCOUNTER — Other Ambulatory Visit: Payer: Self-pay

## 2019-06-20 DIAGNOSIS — E538 Deficiency of other specified B group vitamins: Secondary | ICD-10-CM

## 2019-06-20 MED ORDER — CYANOCOBALAMIN 1000 MCG/ML IJ SOLN
1000.0000 ug | Freq: Once | INTRAMUSCULAR | Status: AC
  Administered 2019-06-20: 1000 ug via INTRAMUSCULAR

## 2019-06-20 NOTE — Progress Notes (Addendum)
Pt presents for B12 injection. Right deltoid(per pt request), IM. Pt voiced no concerns nor showed any signs of distress during injection.   Reviewed.  Dr Nicki Reaper

## 2019-06-23 ENCOUNTER — Other Ambulatory Visit: Payer: Self-pay | Admitting: Internal Medicine

## 2019-06-23 DIAGNOSIS — E1121 Type 2 diabetes mellitus with diabetic nephropathy: Secondary | ICD-10-CM

## 2019-06-23 MED ORDER — TRESIBA FLEXTOUCH 100 UNIT/ML ~~LOC~~ SOPN: 20.0000 [IU] | PEN_INJECTOR | Freq: Every day | SUBCUTANEOUS | 2 refills | Status: DC

## 2019-06-23 NOTE — Telephone Encounter (Signed)
err

## 2019-06-23 NOTE — Telephone Encounter (Signed)
Refill sent left message for patient.

## 2019-06-23 NOTE — Telephone Encounter (Signed)
Pt called in to follow up on her refill request for insulin degludec (TRESIBA FLEXTOUCH) 100 UNIT/ML SOPN FlexTouch Pen  Pt says that she has been requesting through her pharmacy but is having a time. Pt says that she is out of her insulin now.   Pt would like this sent in to :  [facility], [place] [phone] (Phone) [phone] (Fax)

## 2019-06-26 DIAGNOSIS — M779 Enthesopathy, unspecified: Secondary | ICD-10-CM | POA: Diagnosis not present

## 2019-06-26 DIAGNOSIS — Q6689 Other  specified congenital deformities of feet: Secondary | ICD-10-CM | POA: Diagnosis not present

## 2019-06-26 DIAGNOSIS — L600 Ingrowing nail: Secondary | ICD-10-CM | POA: Diagnosis not present

## 2019-07-18 ENCOUNTER — Encounter: Payer: Self-pay | Admitting: Internal Medicine

## 2019-07-20 ENCOUNTER — Encounter: Payer: Self-pay | Admitting: Internal Medicine

## 2019-07-20 ENCOUNTER — Ambulatory Visit (INDEPENDENT_AMBULATORY_CARE_PROVIDER_SITE_OTHER): Payer: PPO | Admitting: Internal Medicine

## 2019-07-20 ENCOUNTER — Other Ambulatory Visit: Payer: Self-pay

## 2019-07-20 VITALS — BP 118/70 | HR 60 | Temp 97.7°F | Ht 64.0 in | Wt 193.0 lb

## 2019-07-20 DIAGNOSIS — L4050 Arthropathic psoriasis, unspecified: Secondary | ICD-10-CM | POA: Diagnosis not present

## 2019-07-20 DIAGNOSIS — E538 Deficiency of other specified B group vitamins: Secondary | ICD-10-CM

## 2019-07-20 DIAGNOSIS — E7800 Pure hypercholesterolemia, unspecified: Secondary | ICD-10-CM

## 2019-07-20 DIAGNOSIS — Z1239 Encounter for other screening for malignant neoplasm of breast: Secondary | ICD-10-CM | POA: Diagnosis not present

## 2019-07-20 DIAGNOSIS — E118 Type 2 diabetes mellitus with unspecified complications: Secondary | ICD-10-CM

## 2019-07-20 DIAGNOSIS — Z23 Encounter for immunization: Secondary | ICD-10-CM

## 2019-07-20 DIAGNOSIS — F439 Reaction to severe stress, unspecified: Secondary | ICD-10-CM | POA: Diagnosis not present

## 2019-07-20 DIAGNOSIS — Z794 Long term (current) use of insulin: Secondary | ICD-10-CM

## 2019-07-20 DIAGNOSIS — K529 Noninfective gastroenteritis and colitis, unspecified: Secondary | ICD-10-CM

## 2019-07-20 DIAGNOSIS — K219 Gastro-esophageal reflux disease without esophagitis: Secondary | ICD-10-CM | POA: Diagnosis not present

## 2019-07-20 DIAGNOSIS — I10 Essential (primary) hypertension: Secondary | ICD-10-CM | POA: Diagnosis not present

## 2019-07-20 DIAGNOSIS — Z0000 Encounter for general adult medical examination without abnormal findings: Secondary | ICD-10-CM | POA: Diagnosis not present

## 2019-07-20 DIAGNOSIS — K5140 Inflammatory polyps of colon without complications: Secondary | ICD-10-CM

## 2019-07-20 MED ORDER — CYANOCOBALAMIN 1000 MCG/ML IJ SOLN
1000.0000 ug | Freq: Once | INTRAMUSCULAR | Status: AC
  Administered 2019-07-20: 1000 ug via INTRAMUSCULAR

## 2019-07-20 NOTE — Progress Notes (Signed)
Patient ID: Ashley Gross, female   DOB: 28-Jul-1951, 68 y.o.   MRN: [id number]   Subjective:    Patient ID: Ashley Gross, female    DOB: 05/18/1951, 68 y.o.   MRN: [id number]  HPI  Patient here for her physical exam.  She reports she is doing relatively well.  Trying to stay active.  No chest pain.  Breathing stable.  No increased cough or congestion.  No acid reflux.  Has been seeing Dr Marius Ditch.  S/p colonoscopy 01/2019.  Multiple polyps removed.  States bowels have been overall better.  Eating well.  Trying to watch what she eats.  States sugars averaging 100-130.  Due mammogram.     Past Medical History:  Diagnosis Date  . Abnormal liver function test 10/06/2012  . Anemia, unspecified   . Asthma   . Colitis   . Complication of anesthesia    asthma attack after shoulder surgery  . Diabetes mellitus ([place])   . Esophagitis   . H/O hiatal hernia   . Heart murmur   . Hx of arteriovenous malformation (AVM)   . Hypertension   . IBS (irritable bowel syndrome)   . Nephrolithiasis   . Pernicious anemia   . Personal history of colonic polyps 02/10/2013   02/08/13 colonoscopy - question of rectal varices, two 3-39m polyps in the sigmoid colon, mass at the hepatic flexure, one 572mpolyp at the hepatic flexure, nodule at the ileocecal valve, congested mucusa in the entire colon, flattened villi mucosa in the terminal ileum.    . Pneumonia   . Psoriatic arthritis (HCC)    s/p penicillamine, plaquenil, MTX, sulfasalazine.  s/p Embrel, Humira,.  Iritis.    . Pure hypercholesterolemia    Past Surgical History:  Procedure Laterality Date  . ABDOMINAL HYSTERECTOMY  1981   ovaries left in place  . ANKLE SURGERY     right  . BUNIONECTOMY    . CHOLECYSTECTOMY  1989  . COLONOSCOPY WITH PROPOFOL N/A 12/27/2017   Procedure: COLONOSCOPY WITH PROPOFOL;  Surgeon: VaLin LandsmanMD;  [facility];  Service: Gastroenterology;  Laterality: N/A;  . COLONOSCOPY WITH PROPOFOL N/A 01/04/2019    Procedure: COLONOSCOPY WITH PROPOFOL;  Surgeon: VaLin LandsmanMD;  [place] [facility] Service: [place];  Laterality: N/A;  Diabetic - oral and injectable  . ESOPHAGOGASTRODUODENOSCOPY (EGD) WITH PROPOFOL N/A 12/27/2017   Procedure: ESOPHAGOGASTRODUODENOSCOPY (EGD) WITH PROPOFOL;  Surgeon: VaLin LandsmanMD;  [place] [facility] Service: G[facility];  Laterality: N/A;  . HAND SURGERY     right  . HEEL SPUR SURGERY    . NOSE SURGERY     x2  . POLYPECTOMY  01/04/2019   Procedure: POLYPECTOMY;  Surgeon: VaLin LandsmanMD;  [place] Service: [place]oscopy;;  . SHOULDER ARTHROSCOPY WITH OPEN ROTATOR CUFF REPAIR Left 09/20/2018   Procedure: SHOULDER ARTHROSCOPY WITH OPEN ROTATOR CUFF REPAIR;  Surgeon: PoCorky MullMD;  Location: [facility];  Service: Orthopedics;  Laterality: Left;  . SHOULDER CLOSED REDUCTION Left 12/21/2018   Procedure: MANIPULATION UNDER ANESTHESIA WITH STEROID INJECTION;  Surgeon: PoCorky MullMD;  [facility]: [facility];  Laterality: Left;  Diabetic - insulin and oral meds  . UMBILICAL HERNIA REPAIR     Family History  Problem Relation Age of Onset  . Diabetes Other   . Hypertension Other   . Breast cancer Neg Hx   . Colon cancer Neg Hx  Social History   Socioeconomic History  . Marital status: Married    Spouse name: Not on file  . Number of children: 2  . Years of education: Not on file  . Highest education level: Not on file  Occupational History  . Not on file  Social Needs  . Financial resource strain: Not on file  . Food insecurity    Worry: Not on file    Inability: Not on file  . Transportation needs    Medical: Not on file    Non-medical: Not on file  Tobacco Use  . Smoking status: Never Smoker  . Smokeless tobacco: Never Used  Substance and Sexual Activity  . Alcohol use: No    Alcohol/week: 0.0 standard drinks  . Drug use: No  . Sexual activity:  Not Currently  Lifestyle  . Physical activity    Days per week: Not on file    Minutes per session: Not on file  . Stress: Not on file  Relationships  . Social Herbalist on phone: Not on file    Gets together: Not on file    Attends religious service: Not on file    Active member of club or organization: Not on file    Attends meetings of clubs or organizations: Not on file    Relationship status: Not on file  Other Topics Concern  . Not on file  Social History Narrative   She is married and has two children    Outpatient Encounter Medications as of 07/20/2019  Medication Sig  . albuterol (ACCUNEB) 0.63 MG/3ML nebulizer solution Take 3 mLs (0.63 mg total) by nebulization every 6 (six) hours as needed for wheezing.  Marland Kitchen amLODipine (NORVASC) 5 MG tablet Take 1 tablet (5 mg total) by mouth 2 (two) times daily.  . Calcium Carbonate-Vit D-Min (CALCIUM 600+D PLUS MINERALS) 600-400 MG-UNIT TABS Take by mouth.  . Cholecalciferol (VITAMIN D-3) 1000 UNITS CAPS Take 1 capsule by mouth daily.  . diphenhydrAMINE (BENADRYL) 25 mg capsule Take by mouth.  . Ergocalciferol (VITAMIN D2) 50 MCG (2000 UT) TABS Take by mouth daily.  . Fluticasone-Salmeterol (ADVAIR DISKUS) 250-50 MCG/DOSE AEPB Inhale 1 puff into the lungs 2 (two) times daily.  Marland Kitchen glipiZIDE (GLUCOTROL) 5 MG tablet Take 1 tablet (5 mg total) by mouth daily.  . insulin degludec (TRESIBA FLEXTOUCH) 100 UNIT/ML SOPN FlexTouch Pen Inject 0.2 mLs (20 Units total) into the skin daily.  . Insulin Pen Needle (BD PEN NEEDLE NANO U/F) 32G X 4 MM MISC Use to inject insulin daily  . ipratropium (ATROVENT HFA) 17 MCG/ACT inhaler Inhale 2 puffs into the lungs every 6 (six) hours.  Marland Kitchen loperamide (IMODIUM) 2 MG capsule Take by mouth.  . losartan (COZAAR) 100 MG tablet TAKE 1 TABLET(100 MG) BY MOUTH DAILY  . omeprazole (PRILOSEC) 20 MG capsule Take 1 capsule (20 mg total) by mouth 2 (two) times daily.  . ONE TOUCH ULTRA TEST test strip PATIENT  NEEDS NEW METER STRIPS AND LANCETS FOR ONE TOUCH. HER INSURANCE NO LONGER COVERS ACCUCHEK.  . pioglitazone (ACTOS) 15 MG tablet Take 1 tablet (15 mg total) by mouth daily.  . potassium chloride (K-DUR) 10 MEQ tablet TAKE 1 TABLET(10 MEQ) BY MOUTH TWICE DAILY  . rosuvastatin (CRESTOR) 10 MG tablet Take 1 tablet (10 mg total) by mouth daily.  Marland Kitchen triamterene-hydrochlorothiazide (MAXZIDE-25) 37.5-25 MG tablet Take 0.5 tablets by mouth daily.  Marland Kitchen dicyclomine (BENTYL) 20 MG tablet Take 1 tablet (20 mg total) by  mouth 4 (four) times daily -  before meals and at bedtime for 30 days.  Marland Kitchen erythromycin ophthalmic ointment   . [EXPIRED] cyanocobalamin ((VITAMIN B-12)) injection 1,000 mcg    No facility-administered encounter medications on file as of 07/20/2019.     Review of Systems  Constitutional: Negative for appetite change and unexpected weight change.  HENT: Negative for congestion and sinus pressure.   Eyes: Negative for pain and visual disturbance.  Respiratory: Negative for cough, chest tightness and shortness of breath.   Cardiovascular: Negative for chest pain, palpitations and leg swelling.  Gastrointestinal: Negative for abdominal pain, diarrhea, nausea and vomiting.  Genitourinary: Negative for difficulty urinating and dysuria.  Musculoskeletal: Negative for joint swelling and myalgias.  Skin: Negative for color change and rash.  Neurological: Negative for dizziness, light-headedness and headaches.  Hematological: Negative for adenopathy. Does not bruise/bleed easily.  Psychiatric/Behavioral: Negative for agitation and dysphoric mood.       Objective:    Physical Exam Constitutional:      General: She is not in acute distress.    Appearance: Normal appearance. She is well-developed.  HENT:     Right Ear: External ear normal.     Left Ear: External ear normal.  Eyes:     General: No scleral icterus.       Right eye: No discharge.        Left eye: No discharge.      Conjunctiva/sclera: Conjunctivae normal.  Neck:     Musculoskeletal: Neck supple. No muscular tenderness.     Thyroid: No thyromegaly.  Cardiovascular:     Rate and Rhythm: Normal rate and regular rhythm.  Pulmonary:     Effort: No tachypnea, accessory muscle usage or respiratory distress.     Breath sounds: Normal breath sounds. No decreased breath sounds or wheezing.  Chest:     Breasts:        Right: No inverted nipple, mass, nipple discharge or tenderness (no axillary adenopathy).        Left: No inverted nipple, mass, nipple discharge or tenderness (no axilarry adenopathy).  Abdominal:     General: Bowel sounds are normal.     Palpations: Abdomen is soft.     Tenderness: There is no abdominal tenderness.  Musculoskeletal:        General: No swelling or tenderness.  Lymphadenopathy:     Cervical: No cervical adenopathy.  Skin:    Findings: No erythema or rash.  Neurological:     Mental Status: She is alert and oriented to person, place, and time.  Psychiatric:        Mood and Affect: Mood normal.        Behavior: Behavior normal.     BP 118/70   Pulse 60   Temp 97.7 F (36.5 C) (Temporal)   Ht '5\' 4"'  (1.626 m)   Wt 193 lb (87.5 kg)   SpO2 99%   BMI 33.13 kg/m  Wt Readings from Last 3 Encounters:  07/20/19 193 lb (87.5 kg)  01/04/19 180 lb (81.6 kg)  12/28/18 185 lb (83.9 kg)     Lab Results  Component Value Date   WBC 6.0 04/19/2019   HGB 12.9 04/19/2019   HCT 39.2 04/19/2019   PLT 240.0 04/19/2019   GLUCOSE 159 (H) 04/19/2019   CHOL 169 04/19/2019   TRIG 216.0 (H) 04/19/2019   HDL 58.30 04/19/2019   LDLDIRECT 74.0 04/19/2019   LDLCALC 56 11/01/2018   ALT 18 04/19/2019   AST 23  04/19/2019   NA 139 04/19/2019   K 3.6 04/19/2019   CL 105 04/19/2019   CREATININE 1.13 04/19/2019   BUN 24 (H) 04/19/2019   CO2 24 04/19/2019   TSH 2.83 06/21/2018   HGBA1C 7.5 (H) 04/19/2019   MICROALBUR 0.9 06/21/2018       Assessment & Plan:   Problem List Items  Addressed This Visit    B12 deficiency    Continue B12 injections.        Chronic diarrhea    Seeing Dr Marius Ditch.  S/p colonoscopy 01/2019.  Multiple polyps removed.  Bowels have been doing better overall.  Follow.        Controlled type 2 diabetes mellitus with complication, with long-term current use of insulin (HCC)    Low carb diet and exercise. It appears sugars are doing better.  Blood sugars averaging 100-130.  Follow met b and a1c.       Relevant Orders   Hemoglobin Z9C   Basic metabolic panel   Microalbumin / creatinine urine ratio   GERD (gastroesophageal reflux disease)    Controlled on current regimen.        Hypercholesterolemia    On crestor.  Low cholesterol diet and exercise.  Follow lipid panel and liver function tests.       Relevant Orders   Hepatic function panel   Lipid panel   Hypertension    Blood pressure under good control.  Continue same medication regimen.  Follow pressures.  Follow metabolic panel.        Relevant Orders   TSH   Inflammatory polyps of colon ([place])    S/p colonoscopy 01/2019.  Multiple polyps removed.  Bowels are better.       Psoriatic arthritis ([place])    Stable.  Followed by Dr Jefm Bryant.       Stress    Overall handling things well.  Follow.         Other Visit Diagnoses    Breast cancer screening       Relevant Orders   MM 3D SCREEN BREAST BILATERAL   Need for immunization against influenza       Relevant Orders   Flu Vaccine QUAD High Dose(Fluad) (Completed)       Einar Pheasant, MD

## 2019-07-23 NOTE — Assessment & Plan Note (Signed)
Seeing Dr Marius Ditch.  S/p colonoscopy 01/2019.  Multiple polyps removed.  Bowels have been doing better overall.  Follow.

## 2019-07-23 NOTE — Assessment & Plan Note (Signed)
Controlled on current regimen.   

## 2019-07-23 NOTE — Assessment & Plan Note (Signed)
Low carb diet and exercise. It appears sugars are doing better.  Blood sugars averaging 100-130.  Follow met b and a1c.

## 2019-07-23 NOTE — Assessment & Plan Note (Signed)
Stable.  Followed by Dr Kernodle.   

## 2019-07-23 NOTE — Assessment & Plan Note (Signed)
On crestor.  Low cholesterol diet and exercise.  Follow lipid panel and liver function tests.   

## 2019-07-23 NOTE — Assessment & Plan Note (Signed)
Overall handling things well.  Follow.   

## 2019-07-23 NOTE — Assessment & Plan Note (Signed)
S/p colonoscopy 01/2019.  Multiple polyps removed.  Bowels are better.

## 2019-07-23 NOTE — Assessment & Plan Note (Signed)
Blood pressure under good control.  Continue same medication regimen.  Follow pressures.  Follow metabolic panel.   

## 2019-07-23 NOTE — Assessment & Plan Note (Signed)
Continue B12 injections.   

## 2019-08-04 ENCOUNTER — Encounter: Payer: Self-pay | Admitting: Internal Medicine

## 2019-08-21 ENCOUNTER — Other Ambulatory Visit: Payer: Self-pay

## 2019-08-21 ENCOUNTER — Other Ambulatory Visit (INDEPENDENT_AMBULATORY_CARE_PROVIDER_SITE_OTHER): Payer: PPO

## 2019-08-21 ENCOUNTER — Ambulatory Visit (INDEPENDENT_AMBULATORY_CARE_PROVIDER_SITE_OTHER): Payer: PPO | Admitting: *Deleted

## 2019-08-21 DIAGNOSIS — E538 Deficiency of other specified B group vitamins: Secondary | ICD-10-CM

## 2019-08-21 DIAGNOSIS — E118 Type 2 diabetes mellitus with unspecified complications: Secondary | ICD-10-CM

## 2019-08-21 DIAGNOSIS — E7800 Pure hypercholesterolemia, unspecified: Secondary | ICD-10-CM

## 2019-08-21 DIAGNOSIS — I10 Essential (primary) hypertension: Secondary | ICD-10-CM | POA: Diagnosis not present

## 2019-08-21 DIAGNOSIS — Z794 Long term (current) use of insulin: Secondary | ICD-10-CM | POA: Diagnosis not present

## 2019-08-21 LAB — HEPATIC FUNCTION PANEL
ALT: 18 U/L (ref 0–35)
AST: 24 U/L (ref 0–37)
Albumin: 4.2 g/dL (ref 3.5–5.2)
Alkaline Phosphatase: 78 U/L (ref 39–117)
Bilirubin, Direct: 0.1 mg/dL (ref 0.0–0.3)
Total Bilirubin: 0.6 mg/dL (ref 0.2–1.2)
Total Protein: 6.8 g/dL (ref 6.0–8.3)

## 2019-08-21 LAB — BASIC METABOLIC PANEL
BUN: 18 mg/dL (ref 6–23)
CO2: 26 mEq/L (ref 19–32)
Calcium: 8.9 mg/dL (ref 8.4–10.5)
Chloride: 103 mEq/L (ref 96–112)
Creatinine, Ser: 1.27 mg/dL — ABNORMAL HIGH (ref 0.40–1.20)
GFR: 41.78 mL/min — ABNORMAL LOW (ref >60.00)
Glucose, Bld: 167 mg/dL — ABNORMAL HIGH (ref 70–99)
Potassium: 3.4 mEq/L — ABNORMAL LOW (ref 3.5–5.1)
Sodium: 140 mEq/L (ref 135–145)

## 2019-08-21 LAB — MICROALBUMIN / CREATININE URINE RATIO
Creatinine,U: 257.3 mg/dL
Microalb Creat Ratio: 7.1 mg/g (ref 0.0–30.0)
Microalb, Ur: 18.2 mg/dL — ABNORMAL HIGH (ref 0.0–1.9)

## 2019-08-21 LAB — LIPID PANEL
Cholesterol: 133 mg/dL (ref 0–200)
HDL: 47.6 mg/dL (ref >39.00)
LDL Cholesterol: 50 mg/dL (ref 0–99)
NonHDL: 84.97
Total CHOL/HDL Ratio: 3
Triglycerides: 176 mg/dL — ABNORMAL HIGH (ref 0.0–149.0)
VLDL: 35.2 mg/dL (ref 0.0–40.0)

## 2019-08-21 LAB — HEMOGLOBIN A1C: Hgb A1c MFr Bld: 7.3 % — ABNORMAL HIGH (ref 4.6–6.5)

## 2019-08-21 LAB — TSH: TSH: 2.35 u[IU]/mL (ref 0.35–4.50)

## 2019-08-21 MED ORDER — CYANOCOBALAMIN 1000 MCG/ML IJ SOLN
1000.0000 ug | Freq: Once | INTRAMUSCULAR | Status: AC
  Administered 2019-08-21: 1000 ug via INTRAMUSCULAR

## 2019-08-21 NOTE — Progress Notes (Addendum)
Patient presented for B 12 injection to right deltoid, patient voiced no concerns nor showed any signs of distress during injection.  Reviewed.  Dr Scott 

## 2019-09-07 ENCOUNTER — Telehealth: Payer: Self-pay | Admitting: Internal Medicine

## 2019-09-07 NOTE — Telephone Encounter (Signed)
Medication Refill - Medication: Pt stated she needs all of her oral medications refilled and sent to pharmacy. Pt has extensive med list and did not give specific names. Please advise.  Has the patient contacted their pharmacy? Yes.   (Agent: If no, request that the patient contact the pharmacy for the refill.) (Agent: If yes, when and what did the pharmacy advise?)  Preferred Pharmacy (with phone number or street name): [facility] [id number], [place] [phone] (Phone) [phone] (Fax)     Agent: Please be advised that RX refills may take up to 3 business days. We ask that you follow-up with your pharmacy.

## 2019-09-11 NOTE — Telephone Encounter (Signed)
Pt returned call. Attempted to reach office. Please advise.

## 2019-09-11 NOTE — Telephone Encounter (Signed)
LMTCB

## 2019-09-13 ENCOUNTER — Other Ambulatory Visit: Payer: Self-pay

## 2019-09-13 ENCOUNTER — Ambulatory Visit
Admission: RE | Admit: 2019-09-13 | Discharge: 2019-09-13 | Disposition: A | Discharge status: Home / Self Care | Payer: PPO | Source: Ambulatory Visit | Attending: Internal Medicine | Admitting: Internal Medicine

## 2019-09-13 ENCOUNTER — Encounter: Payer: Self-pay | Admitting: Internal Medicine

## 2019-09-13 DIAGNOSIS — Z0000 Encounter for general adult medical examination without abnormal findings: Secondary | ICD-10-CM | POA: Insufficient documentation

## 2019-09-13 DIAGNOSIS — Z1231 Encounter for screening mammogram for malignant neoplasm of breast: Secondary | ICD-10-CM | POA: Insufficient documentation

## 2019-09-13 DIAGNOSIS — Z1239 Encounter for other screening for malignant neoplasm of breast: Secondary | ICD-10-CM

## 2019-09-13 MED ORDER — FLUTICASONE-SALMETEROL 250-50 MCG/DOSE IN AEPB: 1.0000 | INHALATION_SPRAY | Freq: Two times a day (BID) | RESPIRATORY_TRACT | 1 refills | Status: DC

## 2019-09-13 MED ORDER — LOSARTAN POTASSIUM 100 MG PO TABS: ORAL_TABLET | ORAL | 1 refills | Status: DC

## 2019-09-13 MED ORDER — PIOGLITAZONE HCL 15 MG PO TABS: 15.0000 mg | ORAL_TABLET | Freq: Every day | ORAL | 1 refills | Status: DC

## 2019-09-13 MED ORDER — OMEPRAZOLE 20 MG PO CPDR: 20.0000 mg | DELAYED_RELEASE_CAPSULE | Freq: Two times a day (BID) | ORAL | 1 refills | Status: DC

## 2019-09-13 MED ORDER — ROSUVASTATIN CALCIUM 10 MG PO TABS: 10.0000 mg | ORAL_TABLET | Freq: Every day | ORAL | 1 refills | Status: DC

## 2019-09-13 MED ORDER — AMLODIPINE BESYLATE 5 MG PO TABS: 5.0000 mg | ORAL_TABLET | Freq: Two times a day (BID) | ORAL | 1 refills | Status: DC

## 2019-09-13 MED ORDER — GLIPIZIDE 5 MG PO TABS: 5.0000 mg | ORAL_TABLET | Freq: Every day | ORAL | 1 refills | Status: DC

## 2019-09-13 MED ORDER — POTASSIUM CHLORIDE ER 10 MEQ PO TBCR: EXTENDED_RELEASE_TABLET | ORAL | 1 refills | Status: DC

## 2019-09-13 NOTE — Telephone Encounter (Signed)
Patient is checking status of medications.  Patient is confused on why this has not been taking care of.  Patient is out of medications now.  Call back [place], [place] [phone] (Phone) [phone] (Fax)

## 2019-09-13 NOTE — Telephone Encounter (Signed)
LMTCB. Sent in everything except tresiba and triam-hctz. Need to clarify

## 2019-09-20 DIAGNOSIS — B351 Tinea unguium: Secondary | ICD-10-CM | POA: Diagnosis not present

## 2019-09-20 DIAGNOSIS — L600 Ingrowing nail: Secondary | ICD-10-CM | POA: Diagnosis not present

## 2019-09-20 DIAGNOSIS — M79675 Pain in left toe(s): Secondary | ICD-10-CM | POA: Diagnosis not present

## 2019-09-20 DIAGNOSIS — M778 Other enthesopathies, not elsewhere classified: Secondary | ICD-10-CM | POA: Diagnosis not present

## 2019-09-20 DIAGNOSIS — M79674 Pain in right toe(s): Secondary | ICD-10-CM | POA: Diagnosis not present

## 2019-09-25 ENCOUNTER — Other Ambulatory Visit: Payer: Self-pay

## 2019-09-25 ENCOUNTER — Ambulatory Visit (INDEPENDENT_AMBULATORY_CARE_PROVIDER_SITE_OTHER): Payer: PPO

## 2019-09-25 DIAGNOSIS — E538 Deficiency of other specified B group vitamins: Secondary | ICD-10-CM | POA: Diagnosis not present

## 2019-09-25 MED ORDER — CYANOCOBALAMIN 1000 MCG/ML IJ SOLN
1000.0000 ug | Freq: Once | INTRAMUSCULAR | Status: AC
  Administered 2019-09-25: 1000 ug via INTRAMUSCULAR

## 2019-09-25 NOTE — Progress Notes (Addendum)
Patient presented today for B12 injection.  Administered IM in the left deltoid.  Patient tolerated well with no signs of distress.  Reviewed.  Dr Scott  

## 2019-10-25 ENCOUNTER — Other Ambulatory Visit: Payer: Self-pay

## 2019-10-25 ENCOUNTER — Ambulatory Visit (INDEPENDENT_AMBULATORY_CARE_PROVIDER_SITE_OTHER): Payer: PPO

## 2019-10-25 DIAGNOSIS — E538 Deficiency of other specified B group vitamins: Secondary | ICD-10-CM | POA: Diagnosis not present

## 2019-10-25 MED ORDER — CYANOCOBALAMIN 1000 MCG/ML IJ SOLN
1000.0000 ug | Freq: Once | INTRAMUSCULAR | Status: AC
  Administered 2019-10-25: 14:00:00 1000 ug via INTRAMUSCULAR

## 2019-10-25 NOTE — Progress Notes (Addendum)
Patient presented for B 12 injection to left deltoid, patient voiced no concerns nor showed any signs of distress during injection.  Reviewed.  Dr Scott 

## 2019-11-21 ENCOUNTER — Ambulatory Visit (INDEPENDENT_AMBULATORY_CARE_PROVIDER_SITE_OTHER): Payer: PPO | Admitting: Internal Medicine

## 2019-11-21 DIAGNOSIS — R6889 Other general symptoms and signs: Secondary | ICD-10-CM | POA: Diagnosis not present

## 2019-11-21 DIAGNOSIS — K625 Hemorrhage of anus and rectum: Secondary | ICD-10-CM | POA: Diagnosis not present

## 2019-11-21 DIAGNOSIS — E538 Deficiency of other specified B group vitamins: Secondary | ICD-10-CM | POA: Diagnosis not present

## 2019-11-21 DIAGNOSIS — M7989 Other specified soft tissue disorders: Secondary | ICD-10-CM

## 2019-11-21 DIAGNOSIS — K219 Gastro-esophageal reflux disease without esophagitis: Secondary | ICD-10-CM

## 2019-11-21 DIAGNOSIS — I10 Essential (primary) hypertension: Secondary | ICD-10-CM

## 2019-11-21 DIAGNOSIS — K5140 Inflammatory polyps of colon without complications: Secondary | ICD-10-CM | POA: Diagnosis not present

## 2019-11-21 DIAGNOSIS — F439 Reaction to severe stress, unspecified: Secondary | ICD-10-CM | POA: Diagnosis not present

## 2019-11-21 DIAGNOSIS — K529 Noninfective gastroenteritis and colitis, unspecified: Secondary | ICD-10-CM | POA: Diagnosis not present

## 2019-11-21 DIAGNOSIS — E7800 Pure hypercholesterolemia, unspecified: Secondary | ICD-10-CM | POA: Diagnosis not present

## 2019-11-21 DIAGNOSIS — E118 Type 2 diabetes mellitus with unspecified complications: Secondary | ICD-10-CM

## 2019-11-21 DIAGNOSIS — L4050 Arthropathic psoriasis, unspecified: Secondary | ICD-10-CM

## 2019-11-21 DIAGNOSIS — Z794 Long term (current) use of insulin: Secondary | ICD-10-CM

## 2019-11-21 NOTE — Progress Notes (Addendum)
Patient ID: Ashley Gross, female   DOB: [birth year], 69 y.o.   MRN: [id number]   Virtual Visit telephone Note  This visit type was conducted due to national recommendations for restrictions regarding the COVID-19 pandemic (e.g. social distancing).  This format is felt to be most appropriate for this patient at this time.  All issues noted in this document were discussed and addressed.  No physical exam was performed (except for noted visual exam findings with Video Visits).   I connected with Ashley Gross by telephone and verified that I am speaking with the correct person using two identifiers. Location patient: home Location provider: work  Persons participating in the telephone visit: patient, provider  The limitations, risks, security and privacy concerns of performing an evaluation and management service by telephone and the availability of in person appointments have been discussed. The patient expressed understanding and agreed to proceed.   Reason for visit: scheduled follow up.   HPI: She reports she is doing relatively well.  Persistent bowel issues.  Seeing Dr Marius Ditch.  Has noticed some blood recently.  Wants to monitor.  Desires no further intervention at this time.  Some congestion - persistent.  Taking mucinex/robitussin.  No fever.  Using nebulizer.  Taking prilosec.  No chest pain or sob reported. Last a1c 7.3.  Discussed low carb diet/exercise.  Taking crestor.  Has noticed feet/ankles - swelling (bilateral).  Increased swelling if stands or on feet for a long time.  Triam/hctz - 1/2 tablet per day - discussed. AM sugars averaging 110-120.  Highest 141.  Increased stress.  Discussed with her today.  Overall feels she is handling things relatively well.      ROS: See pertinent positives and negatives per HPI.  Past Medical History:  Diagnosis Date  . Abnormal liver function test 10/06/2012  . Anemia, unspecified   . Asthma   . Colitis   . Complication of anesthesia    asthma attack after shoulder surgery  . Diabetes mellitus ([place])   . Esophagitis   . H/O hiatal hernia   . Heart murmur   . Hx of arteriovenous malformation (AVM)   . Hypertension   . IBS (irritable bowel syndrome)   . Nephrolithiasis   . Pernicious anemia   . Personal history of colonic polyps 02/10/2013   02/08/13 colonoscopy - question of rectal varices, two 3-52m polyps in the sigmoid colon, mass at the hepatic flexure, one 510mpolyp at the hepatic flexure, nodule at the ileocecal valve, congested mucusa in the entire colon, flattened villi mucosa in the terminal ileum.    . Pneumonia   . Psoriatic arthritis (HCC)    s/p penicillamine, plaquenil, MTX, sulfasalazine.  s/p Embrel, Humira,.  Iritis.    . Pure hypercholesterolemia     Past Surgical History:  Procedure Laterality Date  . ABDOMINAL HYSTERECTOMY  1981   ovaries left in place  . ANKLE SURGERY     right  . BUNIONECTOMY    . CHOLECYSTECTOMY  1989  . COLONOSCOPY WITH PROPOFOL N/A 12/27/2017   Procedure: COLONOSCOPY WITH PROPOFOL;  Surgeon: VaLin LandsmanMD;  [facility];  Service: Gastroenterology;  Laterality: N/A;  . COLONOSCOPY WITH PROPOFOL N/A 01/04/2019   Procedure: COLONOSCOPY WITH PROPOFOL;  Surgeon: VaLin LandsmanMD;  [facility] Service: Endoscopy;  Laterality: N/A;  Diabetic - oral and injectable  . ESOPHAGOGASTRODUODENOSCOPY (EGD) WITH PROPOFOL N/A 12/27/2017   Procedure: ESOPHAGOGASTRODUODENOSCOPY (EGD) WITH PROPOFOL;  Surgeon: VaLin LandsmanMD;  Location: [facility];  Service: Gastroenterology;  Laterality: N/A;  . HAND SURGERY     right  . HEEL SPUR SURGERY    . NOSE SURGERY     x2  . POLYPECTOMY  01/04/2019   Procedure: POLYPECTOMY;  Surgeon: Lin Landsman, MD;  Location: [place];  Service: Endoscopy;;  . SHOULDER ARTHROSCOPY WITH OPEN ROTATOR CUFF REPAIR Left 09/20/2018   Procedure: SHOULDER ARTHROSCOPY WITH OPEN ROTATOR CUFF REPAIR;   Surgeon: Corky Mull, MD;  Location: [facility];  Service: Orthopedics;  Laterality: Left;  . SHOULDER CLOSED REDUCTION Left 12/21/2018   Procedure: MANIPULATION UNDER ANESTHESIA WITH STEROID INJECTION;  Surgeon: Corky Mull, MD;  Location: [place];  Service: Orthopedics;  Laterality: Left;  Diabetic - insulin and oral meds  . UMBILICAL HERNIA REPAIR      Family History  Problem Relation Age of Onset  . Diabetes Other   . Hypertension Other   . Breast cancer Neg Hx   . Colon cancer Neg Hx     SOCIAL HX: reviewed.    Current Outpatient Medications:  .  albuterol (ACCUNEB) 0.63 MG/3ML nebulizer solution, Take 3 mLs (0.63 mg total) by nebulization every 6 (six) hours as needed for wheezing., Disp: 75 mL, Rfl: 1 .  amLODipine (NORVASC) 5 MG tablet, Take 1 tablet (5 mg total) by mouth 2 (two) times daily., Disp: 180 tablet, Rfl: 1 .  Calcium Carbonate-Vit D-Min (CALCIUM 600+D PLUS MINERALS) 600-400 MG-UNIT TABS, Take by mouth., Disp: , Rfl:  .  Cholecalciferol (VITAMIN D-3) 1000 UNITS CAPS, Take 1 capsule by mouth daily., Disp: , Rfl:  .  dicyclomine (BENTYL) 20 MG tablet, Take 1 tablet (20 mg total) by mouth 4 (four) times daily -  before meals and at bedtime for 30 days., Disp: 120 tablet, Rfl: 0 .  diphenhydrAMINE (BENADRYL) 25 mg capsule, Take by mouth., Disp: , Rfl:  .  Ergocalciferol (VITAMIN D2) 50 MCG (2000 UT) TABS, Take by mouth daily., Disp: , Rfl:  .  erythromycin ophthalmic ointment, , Disp: , Rfl:  .  Fluticasone-Salmeterol (ADVAIR DISKUS) 250-50 MCG/DOSE AEPB, Inhale 1 puff into the lungs 2 (two) times daily., Disp: 180 each, Rfl: 1 .  glipiZIDE (GLUCOTROL) 5 MG tablet, Take 1 tablet (5 mg total) by mouth daily., Disp: 90 tablet, Rfl: 1 .  insulin degludec (TRESIBA FLEXTOUCH) 100 UNIT/ML SOPN FlexTouch Pen, Inject 0.2 mLs (20 Units total) into the skin daily., Disp: 12 mL, Rfl: 2 .  Insulin Pen Needle (BD PEN NEEDLE NANO U/F) 32G X 4 MM MISC, Use to inject insulin  daily, Disp: 100 each, Rfl: 3 .  ipratropium (ATROVENT HFA) 17 MCG/ACT inhaler, Inhale 2 puffs into the lungs every 6 (six) hours., Disp: , Rfl:  .  loperamide (IMODIUM) 2 MG capsule, Take by mouth., Disp: , Rfl:  .  losartan (COZAAR) 100 MG tablet, TAKE 1 TABLET(100 MG) BY MOUTH DAILY, Disp: 90 tablet, Rfl: 1 .  omeprazole (PRILOSEC) 20 MG capsule, Take 1 capsule (20 mg total) by mouth 2 (two) times daily., Disp: 180 capsule, Rfl: 1 .  ONE TOUCH ULTRA TEST test strip, PATIENT NEEDS NEW METER STRIPS AND LANCETS FOR ONE TOUCH. HER INSURANCE NO LONGER COVERS ACCUCHEK., Disp: 100 each, Rfl: PRN .  pioglitazone (ACTOS) 15 MG tablet, Take 1 tablet (15 mg total) by mouth daily., Disp: 90 tablet, Rfl: 1 .  potassium chloride (KLOR-CON) 10 MEQ tablet, TAKE 1 TABLET(10 MEQ) BY MOUTH TWICE DAILY, Disp: 180 tablet,  Rfl: 1 .  rosuvastatin (CRESTOR) 10 MG tablet, Take 1 tablet (10 mg total) by mouth daily., Disp: 90 tablet, Rfl: 1 .  triamterene-hydrochlorothiazide (MAXZIDE-25) 37.5-25 MG tablet, Take 0.5 tablets by mouth daily., Disp: 45 tablet, Rfl: 1  EXAM:  GENERAL: alert.  Sounds to be in no acute distress.  Answering qeustions appropriately.   PSYCH/NEURO: pleasant and cooperative, no obvious depression or anxiety, speech and thought processing grossly intact  ASSESSMENT AND PLAN:  Discussed the following assessment and plan:  B12 deficiency Continue B12 injections.    Chronic diarrhea Followed by Dr Marius Ditch.  S/p colonoscopy 01/2019.  Multiple polyps removed.  Overall relatively stable.  Some bleeding.  Continue f/u with GI.  She desires no further intervention at this time.    Controlled type 2 diabetes mellitus with complication, with long-term current use of insulin (HCC) Low carb diet and exercise.  Follow met b and a1c.   GERD (gastroesophageal reflux disease) Controlled.   Hypercholesterolemia On crestor.  Low cholesterol diet and exercise.  Follow lipid panel and liver function tests.     Hypertension Blood pressure has been under good control.  Continue same medication regimen.  Follow pressures.  Follow metabolic panel.    Inflammatory polyps of colon [facility]) S/p colonoscopy with removal of multiple polyps (01/2019).  Continue f/u with GI.   Psoriatic arthritis ([place]) Followed by Dr Jefm Bryant.  Stable.   Rectal bleeding S/p colonoscopy with removal of multiple polyps as otlined.  S/p hemorrhoid ligation.  Some bleeding still noticed.  Continue f/u with GI.  Desires no further intervention at this time.    Stress Discussed with her today.  Overall handling things relatively well.  Follow.    Congestion of throat Some congestin as outlined.  Continue mucinex/robitussin.  States if takes mucinex regularly - controls.  Given persistent symptoms, will check cxr.  Continue prilosec to treat reflux.    Swelling of lower extremity Feet/ankle swelling.  Elevate legs.  Compression hose.  Check metabolic panel.     Orders Placed This Encounter  Procedures  . DG Chest 2 View    Standing Status:   Future    Standing Expiration Date:   01/23/2021    Order Specific Question:   Reason for Exam (SYMPTOM  OR DIAGNOSIS REQUIRED)    Answer:   persistent congestion    Order Specific Question:   Preferred imaging location?    Answer:   [facility]    Order Specific Question:   Radiology Contrast Protocol - do NOT remove file path    Answer:   _0 [email or web address]  . Hemoglobin A1c    Standing Status:   Future    Standing Expiration Date:   11/25/2020  . Hepatic function panel    Standing Status:   Future    Standing Expiration Date:   11/25/2020  . Lipid panel    Standing Status:   Future    Standing Expiration Date:   11/25/2020  . Basic metabolic panel    Standing Status:   Future    Standing Expiration Date:   11/25/2020     I discussed the assessment and treatment plan with the patient. The patient was provided an opportunity to  ask questions and all were answered. The patient agreed with the plan and demonstrated an understanding of the instructions.   The patient was advised to call back or seek an in-person evaluation if the symptoms worsen or if the condition fails to improve as anticipated.  I  spent 23 minutes of non face to face time with patient during this visit.    Einar Pheasant, MD

## 2019-11-22 ENCOUNTER — Other Ambulatory Visit: Payer: Self-pay

## 2019-11-26 ENCOUNTER — Encounter: Payer: Self-pay | Admitting: Internal Medicine

## 2019-11-26 DIAGNOSIS — M7989 Other specified soft tissue disorders: Secondary | ICD-10-CM | POA: Insufficient documentation

## 2019-11-26 NOTE — Assessment & Plan Note (Signed)
Continue B12 injections.   

## 2019-11-26 NOTE — Assessment & Plan Note (Signed)
On crestor.  Low cholesterol diet and exercise.  Follow lipid panel and liver function tests.   

## 2019-11-26 NOTE — Assessment & Plan Note (Signed)
Low carb diet and exercise.  Follow met b and a1c.  

## 2019-11-26 NOTE — Assessment & Plan Note (Signed)
Discussed with her today.  Overall handling things relatively well.  Follow.

## 2019-11-26 NOTE — Assessment & Plan Note (Signed)
Controlled.  

## 2019-11-26 NOTE — Assessment & Plan Note (Signed)
Followed by Dr Jefm Bryant.  Stable.

## 2019-11-26 NOTE — Assessment & Plan Note (Addendum)
Some congestin as outlined.  Continue mucinex/robitussin.  States if takes mucinex regularly - controls.  Given persistent symptoms, will check cxr.  Continue prilosec to treat reflux.

## 2019-11-26 NOTE — Assessment & Plan Note (Signed)
Blood pressure has been under good control.  Continue same medication regimen.  Follow pressures.  Follow metabolic panel.   

## 2019-11-26 NOTE — Assessment & Plan Note (Signed)
S/p colonoscopy with removal of multiple polyps as otlined.  S/p hemorrhoid ligation.  Some bleeding still noticed.  Continue f/u with GI.  Desires no further intervention at this time.

## 2019-11-26 NOTE — Assessment & Plan Note (Signed)
Feet/ankle swelling.  Elevate legs.  Compression hose.  Check metabolic panel.

## 2019-11-26 NOTE — Assessment & Plan Note (Signed)
Followed by Dr Marius Ditch.  S/p colonoscopy 01/2019.  Multiple polyps removed.  Overall relatively stable.  Some bleeding.  Continue f/u with GI.  She desires no further intervention at this time.

## 2019-11-26 NOTE — Assessment & Plan Note (Signed)
S/p colonoscopy with removal of multiple polyps (01/2019).  Continue f/u with GI.

## 2019-11-28 ENCOUNTER — Other Ambulatory Visit
Admission: RE | Admit: 2019-11-28 | Discharge: 2019-11-28 | Disposition: A | Discharge status: Home / Self Care | Payer: PPO | Source: Ambulatory Visit | Attending: Internal Medicine | Admitting: Internal Medicine

## 2019-11-28 ENCOUNTER — Ambulatory Visit
Admission: RE | Admit: 2019-11-28 | Discharge: 2019-11-28 | Disposition: A | Discharge status: Home / Self Care | Payer: PPO | Source: Ambulatory Visit | Attending: Internal Medicine | Admitting: Internal Medicine

## 2019-11-28 ENCOUNTER — Other Ambulatory Visit: Payer: Self-pay

## 2019-11-28 ENCOUNTER — Ambulatory Visit (INDEPENDENT_AMBULATORY_CARE_PROVIDER_SITE_OTHER): Payer: PPO

## 2019-11-28 DIAGNOSIS — R05 Cough: Secondary | ICD-10-CM | POA: Diagnosis not present

## 2019-11-28 DIAGNOSIS — R6889 Other general symptoms and signs: Secondary | ICD-10-CM | POA: Insufficient documentation

## 2019-11-28 DIAGNOSIS — Z794 Long term (current) use of insulin: Secondary | ICD-10-CM

## 2019-11-28 DIAGNOSIS — E7800 Pure hypercholesterolemia, unspecified: Secondary | ICD-10-CM

## 2019-11-28 DIAGNOSIS — I10 Essential (primary) hypertension: Secondary | ICD-10-CM

## 2019-11-28 DIAGNOSIS — E538 Deficiency of other specified B group vitamins: Secondary | ICD-10-CM | POA: Diagnosis not present

## 2019-11-28 DIAGNOSIS — E118 Type 2 diabetes mellitus with unspecified complications: Secondary | ICD-10-CM

## 2019-11-28 LAB — LIPID PANEL
Cholesterol: 119 mg/dL (ref 0–200)
HDL: 48 mg/dL (ref >40)
LDL Cholesterol: 48 mg/dL (ref 0–99)
Total CHOL/HDL Ratio: 2.5 RATIO
Triglycerides: 113 mg/dL (ref <150)
VLDL: 23 mg/dL (ref 0–40)

## 2019-11-28 LAB — HEPATIC FUNCTION PANEL
ALT: 20 U/L (ref 0–44)
AST: 26 U/L (ref 15–41)
Albumin: 3.7 g/dL (ref 3.5–5.0)
Alkaline Phosphatase: 86 U/L (ref 38–126)
Bilirubin, Direct: <0.1 mg/dL (ref 0.0–0.2)
Total Bilirubin: 0.6 mg/dL (ref 0.3–1.2)
Total Protein: 7.1 g/dL (ref 6.5–8.1)

## 2019-11-28 LAB — BASIC METABOLIC PANEL
Anion gap: 9 (ref 5–15)
BUN: 18 mg/dL (ref 8–23)
CO2: 26 mmol/L (ref 22–32)
Calcium: 8.8 mg/dL — ABNORMAL LOW (ref 8.9–10.3)
Chloride: 107 mmol/L (ref 98–111)
Creatinine, Ser: 1.03 mg/dL — ABNORMAL HIGH (ref 0.44–1.00)
GFR calc Af Amer: >60 mL/min (ref >60)
GFR calc non Af Amer: 56 mL/min — ABNORMAL LOW (ref >60)
Glucose, Bld: 104 mg/dL — ABNORMAL HIGH (ref 70–99)
Potassium: 4 mmol/L (ref 3.5–5.1)
Sodium: 142 mmol/L (ref 135–145)

## 2019-11-28 LAB — HEMOGLOBIN A1C
Hgb A1c MFr Bld: 7.4 % — ABNORMAL HIGH (ref 4.8–5.6)
Mean Plasma Glucose: 165.68 mg/dL

## 2019-11-28 MED ORDER — CYANOCOBALAMIN 1000 MCG/ML IJ SOLN
1000.0000 ug | Freq: Once | INTRAMUSCULAR | Status: AC
  Administered 2019-11-28: 1000 ug via INTRAMUSCULAR

## 2019-11-30 ENCOUNTER — Other Ambulatory Visit: Payer: Self-pay | Admitting: Internal Medicine

## 2019-11-30 DIAGNOSIS — Z794 Long term (current) use of insulin: Secondary | ICD-10-CM

## 2019-11-30 DIAGNOSIS — R05 Cough: Secondary | ICD-10-CM

## 2019-11-30 DIAGNOSIS — R053 Chronic cough: Secondary | ICD-10-CM

## 2019-11-30 DIAGNOSIS — E118 Type 2 diabetes mellitus with unspecified complications: Secondary | ICD-10-CM

## 2019-11-30 NOTE — Progress Notes (Signed)
Orders placed for CCM referral and pulmonary referral.

## 2019-12-01 ENCOUNTER — Telehealth: Payer: Self-pay | Admitting: Internal Medicine

## 2019-12-01 NOTE — Chronic Care Management (AMB) (Signed)
  Chronic Care Management   Note  12/01/2019 Name: DEAUNNA OLARTE MRN: [id number] DOB: Jan 23, 1951  [name] is a 69 y.o. year old female who is a primary care patient of Einar Pheasant, MD. I reached out to [name] by phone today in response to a referral sent by Ms. Mora Bellman Rane's PCP, Dr. Einar Pheasant     Ms. Harpham was given information about Chronic Care Management services today including:  1. CCM service includes personalized support from designated clinical staff supervised by her physician, including individualized plan of care and coordination with other care providers 2. 24/7 contact phone numbers for assistance for urgent and routine care needs. 3. Service will only be billed when office clinical staff spend 20 minutes or more in a month to coordinate care. 4. Only one practitioner may furnish and bill the service in a calendar month. 5. The patient may stop CCM services at any time (effective at the end of the month) by phone call to the office staff. 6. The patient will be responsible for cost sharing (co-pay) of up to 20% of the service fee (after annual deductible is met).  Patient agreed to services and verbal consent obtained.   Follow up plan: Telephone appointment with care management team member scheduled for:01/04/2020  Glenna Durand, LPN Health Advisor, [facility] ??[email or web address] ??[phone]

## 2019-12-04 ENCOUNTER — Other Ambulatory Visit: Payer: Self-pay | Admitting: Lab

## 2019-12-04 ENCOUNTER — Telehealth: Payer: Self-pay | Admitting: Internal Medicine

## 2019-12-04 DIAGNOSIS — E1121 Type 2 diabetes mellitus with diabetic nephropathy: Secondary | ICD-10-CM

## 2019-12-04 MED ORDER — BD PEN NEEDLE NANO U/F 32G X 4 MM MISC: 3 refills | Status: DC

## 2019-12-04 MED ORDER — TRIAMTERENE-HCTZ 37.5-25 MG PO TABS: 0.5000 | ORAL_TABLET | Freq: Every day | ORAL | 1 refills | Status: DC

## 2019-12-04 MED ORDER — TRESIBA FLEXTOUCH 100 UNIT/ML ~~LOC~~ SOPN: 20.0000 [IU] | PEN_INJECTOR | Freq: Every day | SUBCUTANEOUS | 2 refills | Status: DC

## 2019-12-04 NOTE — Telephone Encounter (Signed)
Pt medication Refill sent in.

## 2019-12-04 NOTE — Telephone Encounter (Signed)
Patient had a phone visit with Dr. Nicki Reaper on 12/01/2019. Patient said that office was suppose to call [facility], in [place] to get her insulin refilled and triamterene-hydrochlorothiazide (MAXZIDE-25) 37.5-25 MG tablet, patient only has one day remaining, and 1 pen of her insulin.

## 2020-01-02 ENCOUNTER — Ambulatory Visit (INDEPENDENT_AMBULATORY_CARE_PROVIDER_SITE_OTHER): Payer: PPO

## 2020-01-02 ENCOUNTER — Other Ambulatory Visit: Payer: Self-pay

## 2020-01-02 DIAGNOSIS — E538 Deficiency of other specified B group vitamins: Secondary | ICD-10-CM | POA: Diagnosis not present

## 2020-01-02 MED ORDER — CYANOCOBALAMIN 1000 MCG/ML IJ SOLN
1000.0000 ug | Freq: Once | INTRAMUSCULAR | Status: AC
  Administered 2020-01-02: 1000 ug via INTRAMUSCULAR

## 2020-01-02 NOTE — Progress Notes (Addendum)
Patient presented for B 12 injection to left deltoid, patient voiced no concerns nor showed any signs of distress during injection.  Reviewed.  Dr Scott 

## 2020-01-04 ENCOUNTER — Other Ambulatory Visit: Payer: Self-pay | Admitting: Pharmacy Technician

## 2020-01-04 ENCOUNTER — Ambulatory Visit (INDEPENDENT_AMBULATORY_CARE_PROVIDER_SITE_OTHER): Payer: PPO | Admitting: Pharmacist

## 2020-01-04 DIAGNOSIS — Z794 Long term (current) use of insulin: Secondary | ICD-10-CM | POA: Diagnosis not present

## 2020-01-04 DIAGNOSIS — E1121 Type 2 diabetes mellitus with diabetic nephropathy: Secondary | ICD-10-CM

## 2020-01-04 DIAGNOSIS — E118 Type 2 diabetes mellitus with unspecified complications: Secondary | ICD-10-CM

## 2020-01-04 DIAGNOSIS — I10 Essential (primary) hypertension: Secondary | ICD-10-CM

## 2020-01-04 MED ORDER — BUDESONIDE-FORMOTEROL FUMARATE 160-4.5 MCG/ACT IN AERO: 2.0000 | INHALATION_SPRAY | Freq: Two times a day (BID) | RESPIRATORY_TRACT | 2 refills | Status: DC

## 2020-01-04 MED ORDER — JARDIANCE 10 MG PO TABS: 10.0000 mg | ORAL_TABLET | Freq: Every day | ORAL | 2 refills | Status: DC

## 2020-01-04 NOTE — Chronic Care Management (AMB) (Signed)
Chronic Care Management   Note  01/04/2020 Name: Ashley Gross MRN: [id number] DOB: 15-Nov-1950   Subjective:  Ashley Gross is a 69 y.o. year old female who is a primary care patient of Einar Pheasant, MD. The CCM team was consulted for assistance with chronic disease management and care coordination needs.    Contacted patient for medication management review.   Review of patient status, including review of consultants reports, laboratory and other test data, was performed as part of comprehensive evaluation and provision of chronic care management services.   SDOH (Social Determinants of Health) assessments and interventions performed:  SDOH Interventions     Most Recent Value  SDOH Interventions  SDOH Interventions for the Following Domains  Financial Strain  Financial Strain Interventions  Other (Comment) [patient assistance]       Objective:  Lab Results  Component Value Date   CREATININE 1.03 (H) 11/28/2019   CREATININE 1.27 (H) 08/21/2019   CREATININE 1.13 04/19/2019    Lab Results  Component Value Date   HGBA1C 7.4 (H) 11/28/2019       Component Value Date/Time   CHOL 119 11/28/2019 0953   TRIG 113 11/28/2019 0953   HDL 48 11/28/2019 0953   CHOLHDL 2.5 11/28/2019 0953   VLDL 23 11/28/2019 0953   LDLCALC 48 11/28/2019 0953   LDLCALC 56 11/01/2018 1035   LDLDIRECT 74.0 04/19/2019 1056    Clinical ASCVD: No  The ASCVD Risk score (Goff DC Jr., et al., 2013) failed to calculate for the following reasons:   The valid total cholesterol range is 130 to 320 mg/dL    BP Readings from Last 3 Encounters:  07/20/19 118/70  01/04/19 (!) 125/57  12/28/18 120/75    Allergies  Allergen Reactions  . Aspirin Other (See Comments)    Increased bleeding  . Beta Adrenergic Blockers   . Lisinopril     cough  . Mtx Support [Cobalamine Combinations] Other (See Comments)    Respiratory symptoms  . Vasotec [Enalapril] Cough  . Verapamil Other (See Comments)   Heart block  . Voltaren [Diclofenac Sodium]   . Erythromycin Other (See Comments)    Gi intolerance  . Indocin [Indomethacin]     Medications Reviewed Today    Reviewed by De Hollingshead, [facility] (Pharmacist) on 01/04/20 at 1114  Med List Status: <None>  Medication Order Taking? Sig Documenting Provider Last Dose Status Informant  albuterol (ACCUNEB) 0.63 MG/3ML nebulizer solution [id number] No Take 3 mLs (0.63 mg total) by nebulization every 6 (six) hours as needed for wheezing.  Patient not taking: Reported on 01/04/2020   Delano Metz, Taylor Not Taking Active            Med Note [facility], Willey Blade   Wed Dec 21, 2018  9:38 AM) PRN, last used several months ago  amLODipine (NORVASC) 5 MG tablet [id number] Yes Take 1 tablet (5 mg total) by mouth 2 (two) times daily. Einar Pheasant, MD Taking Active   Calcium Carbonate-Vit D-Min (CALCIUM 600+D PLUS MINERALS) 600-400 MG-UNIT TABS [id number] Yes Take by mouth. [provider] Taking Active   cetirizine (ZYRTEC) 10 MG tablet [id number] Yes Take 10 mg by mouth daily. [provider] Taking Active   Cholecalciferol (VITAMIN D-3) 1000 UNITS CAPS [id number] Yes Take 1 capsule by mouth daily. [provider] Taking Active   dicyclomine (BENTYL) 20 MG tablet [id number] Yes Take 1 tablet (20 mg total) by mouth 4 (four) times daily -  before meals and at  bedtime for 30 days. Lin Landsman, MD Taking Active            Med Note Nat Christen Jan 04, 2020 11:11 AM) PRN stomach spasms  diphenhydrAMINE (BENADRYL) 25 mg capsule [id number] No Take by mouth. [provider] Not Taking Active            Med Note Nat Christen Jan 04, 2020 11:12 AM)    Fluticasone-Salmeterol (ADVAIR DISKUS) 250-50 MCG/DOSE AEPB [id number] Yes Inhale 1 puff into the lungs 2 (two) times daily. Einar Pheasant, MD Taking Active   glipiZIDE (GLUCOTROL) 5 MG tablet [id number] Yes Take 1 tablet (5 mg total) by mouth daily.  Einar Pheasant, MD Taking Active            Med Note Nat Christen Jan 04, 2020 11:05 AM) Taking QPM  insulin degludec (TRESIBA FLEXTOUCH) 100 UNIT/ML SOPN FlexTouch Pen [id number] Yes Inject 0.2 mLs (20 Units total) into the skin daily. Einar Pheasant, MD Taking Active   Insulin Pen Needle (BD PEN NEEDLE NANO U/F) 32G X 4 MM MISC [id number] Yes Use to inject insulin daily Einar Pheasant, MD Taking Active   ipratropium (ATROVENT HFA) 17 MCG/ACT inhaler [id number] No Inhale 2 puffs into the lungs every 6 (six) hours. [provider] Not Taking Active            Med Note Nat Christen Jan 04, 2020 11:12 AM) Using PRN  loperamide (IMODIUM) 2 MG capsule [id number] Yes Take by mouth. [provider] Taking Active            Med Note Nat Christen Jan 04, 2020 11:14 AM) Taking 1 QAM  losartan (COZAAR) 100 MG tablet [id number] Yes TAKE 1 TABLET(100 MG) BY MOUTH DAILY Einar Pheasant, MD Taking Active   omeprazole (PRILOSEC) 20 MG capsule [id number] Yes Take 1 capsule (20 mg total) by mouth 2 (two) times daily. Einar Pheasant, MD Taking Active   ONE TOUCH ULTRA TEST test strip [id number] Yes PATIENT NEEDS NEW METER STRIPS AND LANCETS FOR ONE TOUCH. HER INSURANCE NO LONGER COVERS ACCUCHEK. Einar Pheasant, MD Taking Active   pioglitazone (ACTOS) 15 MG tablet [id number] Yes Take 1 tablet (15 mg total) by mouth daily. Einar Pheasant, MD Taking Active   potassium chloride (KLOR-CON) 10 MEQ tablet [id number] Yes TAKE 1 TABLET(10 MEQ) BY MOUTH TWICE DAILY Einar Pheasant, MD Taking Active   rosuvastatin (CRESTOR) 10 MG tablet [id number] Yes Take 1 tablet (10 mg total) by mouth daily. Einar Pheasant, MD Taking Active   triamterene-hydrochlorothiazide [facility]) 37.5-25 MG tablet [id number] Yes Take 0.5 tablets by mouth daily. Einar Pheasant, MD Taking Active            Assessment:   Goals Addressed            This Visit's Progress     Patient  Stated   . "I want to work on sugars" (pt-stated)       CARE PLAN ENTRY (see longtitudinal plan of care for additional care plan information)  Current Barriers:  . Diabetes: uncontrolled; complicated by chronic medical conditions including CKD, HTN, asthma, most recent A1c 7.4% o Reports lower extremity edema o Wonders if we can increase triamterene/HCTZ to 1 tablet, as splitting the tablet is difficult and she often feels she looses some of the dose in powder o Notes that since starting daily Zyrtec, she has not had the  congestion/cough tickle that she was discussing previously w/ Dr. Nicki Reaper, so she decided not to see pulomonary  o Does endorse financial concerns with medication costs, as her husband has multiple medical conditions and does home peritoneal dialysis . Most recent eGFR: 56 . Current antihyperglycemic regimen: Tresiba 20 units daily, glipizide 5 mg daily, pioglitazone 15 mg daily (taking all at bedtime) . Reports 1 fasting readings of 72, but no other episodes or symptoms of hypoglycemia . Current blood glucose readings: o Fasting: 110-120s o Pre supper: 200-300s . Cardiovascular risk reduction: o Current hypertensive regimen: amlodipine 5 mg BID, losartan 100 mg QPM, triamterene/HCTZ 37.5/25 1/2 tab QAM o Current hyperlipidemia regimen: rosuvastatin 10 mg daily; las LDL at goal <70 o Current antiplatelet regimen: none . Asthma/congestion: cetirizine 10 mg daily; Advair 250/50 mcg BID, PRN use of albuterol in nebulizer and Atrovent inhaler. Has not needed either of these PRN medications in a few weeks . Potassium 10 mEq BID. Last K level 4.0 mEq  Pharmacist Clinical Goal(s):  Marland Kitchen Over the next 90 days, patient will work with PharmD and primary care provider to address optimized medication management  Interventions: . Comprehensive medication review performed, medication list updated in electronic medical record . Reviewed current glucose readings. Qpm glipizide is not  covering meal time elevations, however, better options available. Pioglitazone likely contributing to LEE. Given patient's CKD, SGLT2 w/ renal protection data is an appropriate option. Start Jardiance 10 mg daily; stop glipizide and pioglitazone. Recommended to take in the morning. Counseled on s/sx genitourinary infections and to stay well hydrated. Will pursue patient assistance once tolerability is determined . Continue Tresiba 20 units daily. Patient qualifies for [employer] patient assistance. Will collaborate w/ CphT to mail patient her portion of application. Counseled patient that we will need copy of 2020 tax return for application. We have copy of insurance card on file. Patient will include relevant financial information as above. Will collaborate w Dr. Nicki Reaper for provider signature.   . Reviewed current drug spend. Patient will not qualify for Advair assistance from [employer], but will qualify for Symbicort assistance through [employer]. Patient amenable to the switch. Will collaborate w/ Susy Frizzle, CPhT to mail patient her portion of application. Patient will include relevant financial information as above. Will collaborate w Dr. Nicki Reaper for provider signature.   . Reviewed hx triamterene/HCTZ dosing. Appears it was originally held d/t renal function, then re-initiated at half tablet. Will continue to monitor at this time. If able, could eventually increase to 1 tablet for easiness of administration, can reduce amlodipine to 5 mg daily to balance BP, if needed. Will avoid adjusting multiple diuretic medications (while initiating SGLT2)  Patient Self Care Activities:  . Patient will check blood glucose BID, document, and provide at future appointments . Patient will take medications as prescribed . Patient will report any questions or concerns to provider   Initial goal documentation        Plan: - Will collaborate w/ patient and provider as above - Scheduled f/u call 02/08/20  [name]  Darnelle Maffucci, PharmD, BCACP, [place] Pharmacist [facility] [place] [phone]

## 2020-01-04 NOTE — Patient Outreach (Addendum)
[facility]) Care Management  01/04/2020  Ashley Gross [birth year] [id number]                                       Medication Assistance Referral  Referral From: [name] Alvia Grove D at [facility]  Medication/Company: Symbicort / [employer] Patient application portion:  Mailed Provider application portion:  N/A embedded pharmacist to have signed while in clinic to [place] Provider address/fax verified via: Office website  Medication/Company: [name] / [employer] Patient application portion:  Education officer, museum portion:  N/A embedded pharmacist to have signed while in clinic to Dr. Einar Pheasant Provider address/fax verified via: Office website    Follow up:  Will follow up with patient in 10-15 business days to confirm application(s) have been received.  Isaid Salvia P. Shareen Capwell, [place]  [phone]

## 2020-01-04 NOTE — Patient Instructions (Addendum)
Visit Information  Goals Addressed            This Visit's Progress     Patient Stated   . "I want to work on sugars" (pt-stated)       CARE PLAN ENTRY (see longtitudinal plan of care for additional care plan information)  Current Barriers:  . Diabetes: uncontrolled; complicated by chronic medical conditions including CKD, HTN, asthma, most recent A1c 7.4% o Reports lower extremity edema o Wonders if we can increase triamterene/HCTZ to 1 tablet, as splitting the tablet is difficult and she often feels she looses some of the dose in powder o Notes that since starting daily Zyrtec, she has not had the congestion/cough tickle that she was discussing previously w/ Dr. Nicki Reaper, so she decided not to see pulomonary  o Does endorse financial concerns with medication costs, as her husband has multiple medical conditions and does home peritoneal dialysis . Most recent eGFR: 56 . Current antihyperglycemic regimen: Tresiba 20 units daily, glipizide 5 mg daily, pioglitazone 15 mg daily (taking all at bedtime) . Reports 1 fasting readings of 72, but no other episodes or symptoms of hypoglycemia . Current blood glucose readings: o Fasting: 110-120s o Pre supper: 200-300s . Cardiovascular risk reduction: o Current hypertensive regimen: amlodipine 5 mg BID, losartan 100 mg QPM, triamterene/HCTZ 37.5/25 1/2 tab QAM o Current hyperlipidemia regimen: rosuvastatin 10 mg daily; las LDL at goal <70 o Current antiplatelet regimen: none . Asthma/congestion: cetirizine 10 mg daily; Advair 250/50 mcg BID, PRN use of albuterol in nebulizer and Atrovent inhaler. Has not needed either of these PRN medications in a few weeks . Potassium 10 mEq BID. Last K level 4.0 mEq  Pharmacist Clinical Goal(s):  Marland Kitchen Over the next 90 days, patient will work with PharmD and primary care provider to address optimized medication management  Interventions: . Comprehensive medication review performed, medication list updated in  electronic medical record . Reviewed current glucose readings. Qpm glipizide is not covering meal time elevations, however, better options available. Pioglitazone likely contributing to LEE. Given patient's CKD, SGLT2 w/ renal protection data is an appropriate option. Start Jardiance 10 mg daily; stop glipizide and pioglitazone. Recommended to take in the morning. Counseled on s/sx genitourinary infections and to stay well hydrated. Will pursue patient assistance once tolerability is determined . Continue Tresiba 20 units daily. Patient qualifies for [employer] patient assistance. Will collaborate w/ CphT to mail patient her portion of application. Counseled patient that we will need copy of 2020 tax return for application. We have copy of insurance card on file. Patient will include relevant financial information as above. Will collaborate w Dr. Nicki Reaper for provider signature.   . Reviewed current drug spend. Patient will not qualify for Advair assistance from [place], but will qualify for Symbicort assistance through [employer]. Patient amenable to the switch. Will collaborate w/ Susy Frizzle, CPhT to mail patient her portion of application. Patient will include relevant financial information as above. Will collaborate w Dr. Nicki Reaper for provider signature.   . Reviewed hx triamterene/HCTZ dosing. Appears it was originally held d/t renal function, then re-initiated at half tablet. Will continue to monitor at this time. If able, could eventually increase to 1 tablet for easiness of administration, can reduce amlodipine to 5 mg daily to balance BP, if needed. Will avoid adjusting multiple diuretic medications (while initiating SGLT2)  Patient Self Care Activities:  . Patient will check blood glucose BID, document, and provide at future appointments . Patient will take medications  as prescribed . Patient will report any questions or concerns to provider   Initial goal documentation        Patient  verbalizes understanding of instructions provided today.   Plan: - Will collaborate w/ patient and provider as above - Scheduled f/u call 02/08/20  [name] Darnelle Maffucci, PharmD, BCACP, [place] Pharmacist [place] [phone]

## 2020-01-05 NOTE — Progress Notes (Signed)
Reviewed information.  Agree with plan.    Dr Tyrees Chopin 

## 2020-01-17 ENCOUNTER — Ambulatory Visit: Payer: Self-pay | Admitting: Pharmacist

## 2020-01-17 DIAGNOSIS — Z794 Long term (current) use of insulin: Secondary | ICD-10-CM

## 2020-01-17 DIAGNOSIS — E118 Type 2 diabetes mellitus with unspecified complications: Secondary | ICD-10-CM

## 2020-01-17 NOTE — Patient Instructions (Signed)
Visit Information  Goals Addressed            This Visit's Progress     Patient Stated   . "I want to work on sugars" (pt-stated)       CARE PLAN ENTRY (see longtitudinal plan of care for additional care plan information)  Current Barriers:  . Diabetes: uncontrolled; complicated by chronic medical conditions including CKD, HTN, asthma, most recent A1c 7.4% o Calls today to report a yeast infection since starting Jardiance. Using OTC monistat for tx o Notes that when she gets up at night to go to the bathroom, she wears a panty liner to catch any leakage. Could be that this is increasing risk for yeast infection w/ SGLT2 initiation . Most recent eGFR: 56 . Current antihyperglycemic regimen: Tresiba 20 units daily, Jardiance 10 mg daily . Cardiovascular risk reduction: o Current hypertensive regimen: amlodipine 5 mg BID, losartan 100 mg QPM, triamterene/HCTZ 37.5/25 1/2 tab QAM o Current hyperlipidemia regimen: rosuvastatin 10 mg daily; las LDL at goal <70 o Current antiplatelet regimen: none . Asthma/congestion: cetirizine 10 mg daily; Advair 250/50 mcg BID, PRN use of albuterol in nebulizer and Atrovent inhaler. Has not needed either of these PRN medications in a few weeks . Potassium 10 mEq BID. Last K level 4.0 mEq  Pharmacist Clinical Goal(s):  Marland Kitchen Over the next 90 days, patient will work with PharmD and primary care provider to address optimized medication management  Interventions: . Counseled patient to hold Jardiance until resolution of yeast infection. Continue to remain well hydrated. Counseled that she can re-try one more time after resolution of yeast infection. If symptoms re-start, will d/c Jardiance and discuss next steps. She verbalized understanding  Patient Self Care Activities:  . Patient will check blood glucose BID, document, and provide at future appointments . Patient will take medications as prescribed . Patient will report any questions or concerns to  provider   Please see past updates related to this goal by clicking on the "Past Updates" button in the selected goal         Patient verbalizes understanding of instructions provided today.   Plan:  - Will outreach as previously scheduled  [name] Darnelle Maffucci, PharmD, [place], [place] Pharmacist [place] [phone]

## 2020-01-17 NOTE — Chronic Care Management (AMB) (Signed)
Chronic Care Management   Follow Up Note   01/17/2020 Name: Ashley Gross MRN: [id number] DOB: 03-Sep-1951  Referred by: Einar Pheasant, MD Reason for referral : Chronic Care Management (Medication Management)   Ashley Gross is a 69 y.o. year old female who is a primary care patient of Einar Pheasant, MD. The CCM team was consulted for assistance with chronic disease management and care coordination needs.    Received call from patient regarding medication management.  Review of patient status, including review of consultants reports, relevant laboratory and other test results, and collaboration with appropriate care team members and the patient's provider was performed as part of comprehensive patient evaluation and provision of chronic care management services.    SDOH (Social Determinants of Health) assessments performed: Yes See Care Plan activities for detailed interventions related to [facility])     Outpatient Encounter Medications as of 01/17/2020  Medication Sig Note  . albuterol (ACCUNEB) 0.63 MG/3ML nebulizer solution Take 3 mLs (0.63 mg total) by nebulization every 6 (six) hours as needed for wheezing. (Patient not taking: Reported on 01/04/2020) 12/21/2018: PRN, last used several months ago  . amLODipine (NORVASC) 5 MG tablet Take 1 tablet (5 mg total) by mouth 2 (two) times daily.   . budesonide-formoterol (SYMBICORT) 160-4.5 MCG/ACT inhaler Inhale 2 puffs into the lungs 2 (two) times daily.   . Calcium Carbonate-Vit D-Min (CALCIUM 600+D PLUS MINERALS) 600-400 MG-UNIT TABS Take by mouth.   . cetirizine (ZYRTEC) 10 MG tablet Take 10 mg by mouth daily.   . Cholecalciferol (VITAMIN D-3) 1000 UNITS CAPS Take 1 capsule by mouth daily.   Marland Kitchen dicyclomine (BENTYL) 20 MG tablet Take 1 tablet (20 mg total) by mouth 4 (four) times daily -  before meals and at bedtime for 30 days. 01/04/2020: PRN stomach spasms  . diphenhydrAMINE (BENADRYL) 25 mg capsule Take by mouth.   . empagliflozin  (JARDIANCE) 10 MG TABS tablet Take 10 mg by mouth daily before breakfast.   . Fluticasone-Salmeterol (ADVAIR DISKUS) 250-50 MCG/DOSE AEPB Inhale 1 puff into the lungs 2 (two) times daily.   . insulin degludec (TRESIBA FLEXTOUCH) 100 UNIT/ML SOPN FlexTouch Pen Inject 0.2 mLs (20 Units total) into the skin daily.   . Insulin Pen Needle (BD PEN NEEDLE NANO U/F) 32G X 4 MM MISC Use to inject insulin daily   . ipratropium (ATROVENT HFA) 17 MCG/ACT inhaler Inhale 2 puffs into the lungs every 6 (six) hours. 01/04/2020: Using PRN  . loperamide (IMODIUM) 2 MG capsule Take by mouth. 01/04/2020: Taking 1 QAM  . losartan (COZAAR) 100 MG tablet TAKE 1 TABLET(100 MG) BY MOUTH DAILY   . omeprazole (PRILOSEC) 20 MG capsule Take 1 capsule (20 mg total) by mouth 2 (two) times daily.   . ONE TOUCH ULTRA TEST test strip PATIENT NEEDS NEW METER STRIPS AND LANCETS FOR ONE TOUCH. HER INSURANCE NO LONGER COVERS ACCUCHEK.   Marland Kitchen potassium chloride (KLOR-CON) 10 MEQ tablet TAKE 1 TABLET(10 MEQ) BY MOUTH TWICE DAILY   . rosuvastatin (CRESTOR) 10 MG tablet Take 1 tablet (10 mg total) by mouth daily.   Marland Kitchen triamterene-hydrochlorothiazide (MAXZIDE-25) 37.5-25 MG tablet Take 0.5 tablets by mouth daily.    No facility-administered encounter medications on file as of 01/17/2020.     Objective:   Goals Addressed            This Visit's Progress     Patient Stated   . "I want to work on sugars" (pt-stated)  CARE PLAN ENTRY (see longtitudinal plan of care for additional care plan information)  Current Barriers:  . Diabetes: uncontrolled; complicated by chronic medical conditions including CKD, HTN, asthma, most recent A1c 7.4% o Calls today to report a yeast infection since starting Jardiance. Using OTC monistat for tx o Notes that when she gets up at night to go to the bathroom, she wears a panty liner to catch any leakage. Could be that this is increasing risk for yeast infection w/ SGLT2 initiation . Most recent eGFR:  56 . Current antihyperglycemic regimen: Tresiba 20 units daily, Jardiance 10 mg daily . Cardiovascular risk reduction: o Current hypertensive regimen: amlodipine 5 mg BID, losartan 100 mg QPM, triamterene/HCTZ 37.5/25 1/2 tab QAM o Current hyperlipidemia regimen: rosuvastatin 10 mg daily; las LDL at goal <70 o Current antiplatelet regimen: none . Asthma/congestion: cetirizine 10 mg daily; Advair 250/50 mcg BID, PRN use of albuterol in nebulizer and Atrovent inhaler. Has not needed either of these PRN medications in a few weeks . Potassium 10 mEq BID. Last K level 4.0 mEq  Pharmacist Clinical Goal(s):  Marland Kitchen Over the next 90 days, patient will work with PharmD and primary care provider to address optimized medication management  Interventions: . Counseled patient to hold Jardiance until resolution of yeast infection. Continue to remain well hydrated. Counseled that she can re-try one more time after resolution of yeast infection. If symptoms re-start, will d/c Jardiance and discuss next steps. She verbalized understanding  Patient Self Care Activities:  . Patient will check blood glucose BID, document, and provide at future appointments . Patient will take medications as prescribed . Patient will report any questions or concerns to provider   Please see past updates related to this goal by clicking on the "Past Updates" button in the selected goal          Plan:  - Will outreach as previously scheduled  [name] Darnelle Maffucci, PharmD, [place], [place] Pharmacist [place] [phone]

## 2020-01-18 DIAGNOSIS — B351 Tinea unguium: Secondary | ICD-10-CM | POA: Diagnosis not present

## 2020-01-18 DIAGNOSIS — L600 Ingrowing nail: Secondary | ICD-10-CM | POA: Diagnosis not present

## 2020-01-18 DIAGNOSIS — M79675 Pain in left toe(s): Secondary | ICD-10-CM | POA: Diagnosis not present

## 2020-01-18 DIAGNOSIS — M79674 Pain in right toe(s): Secondary | ICD-10-CM | POA: Diagnosis not present

## 2020-01-18 DIAGNOSIS — M778 Other enthesopathies, not elsewhere classified: Secondary | ICD-10-CM | POA: Diagnosis not present

## 2020-01-18 NOTE — Progress Notes (Signed)
Reviewed information.  Agree with plan.  Pt to call back if problems after restarting jardiance.    Dr Nicki Reaper

## 2020-01-23 ENCOUNTER — Other Ambulatory Visit: Payer: Self-pay | Admitting: Pharmacy Technician

## 2020-01-23 NOTE — Patient Outreach (Signed)
[place] [facility]) Care Management  01/23/2020  ACELIN CAMPILLO Nov 23, 1950 [id number]   ADDENDUM  Incoming call received from patient, HIPPA identifiers verified.  Patient informed she found the Medicare AB number and was given permission to write it in on her application.  Will submit application once provider's portion is received. In basket message sent to embedded [place] for assistance in getting the provider's portion signed.  Ezmae Speers P. Jolynn Bajorek, [place]  [phone]

## 2020-01-23 NOTE — Progress Notes (Signed)
Ashley Gross, I have the forms for Dr Nicki Reaper to sign but the form has the wrong office name on it.

## 2020-01-23 NOTE — Patient Outreach (Addendum)
[place] [facility]) Care Management  01/23/2020  KENTERIA LIPPE 1951/02/28 [id number]   Successful outreach call placed to patient in regards to [employer] application for Tresiba.  Spoke to patient, HIPAA identifiers verified.  Informed patient that her applications were received. However, she left off her Medicare AB number. Inquired if I could fill that in for the patient. Patient informed her Medicare AB card has been misplaced for some time. She informed she does not recall getting a new card in the past few years. Inquired if patient had an EOB she could locate that may contain the number. She informs she does not get EOBs from Medicare. Patient informed she would try to get it online thru the social security website.  Will await a return call from patient.  Radley Barto P. Lanora Reveron, [place]  [phone]

## 2020-02-06 ENCOUNTER — Other Ambulatory Visit: Payer: Self-pay

## 2020-02-06 ENCOUNTER — Ambulatory Visit (INDEPENDENT_AMBULATORY_CARE_PROVIDER_SITE_OTHER): Payer: PPO

## 2020-02-06 DIAGNOSIS — E538 Deficiency of other specified B group vitamins: Secondary | ICD-10-CM | POA: Diagnosis not present

## 2020-02-06 MED ORDER — CYANOCOBALAMIN 1000 MCG/ML IJ SOLN
1000.0000 ug | Freq: Once | INTRAMUSCULAR | Status: AC
  Administered 2020-02-06: 1000 ug via INTRAMUSCULAR

## 2020-02-06 NOTE — Progress Notes (Addendum)
Patient presented for B 12 injection to right deltoid, patient voiced no concerns nor showed any signs of distress during injection.  Reviewed.  Dr Scott 

## 2020-02-07 ENCOUNTER — Other Ambulatory Visit: Payer: Self-pay | Admitting: Pharmacy Technician

## 2020-02-07 NOTE — Patient Outreach (Signed)
[name] [facility]) Care Management  02/07/2020  Ashley Gross 1951-04-10 [id number]   Received both patient and provider portion(s) of patient assistance application(s) for Symbicort with [employer] and Tresiba with [employer]. Faxed completed application and required documents into [employer] and [employer].  Will follow up with company(ies) in 5-10 business days to check status of application(s).  [name] P. Josphine Laffey, [place]  [phone]

## 2020-02-08 ENCOUNTER — Ambulatory Visit: Payer: Self-pay | Admitting: Pharmacist

## 2020-02-08 ENCOUNTER — Telehealth: Payer: PPO

## 2020-02-08 NOTE — Chronic Care Management (AMB) (Signed)
  Chronic Care Management   Note  02/08/2020 Name: Ashley Gross MRN: [id number] DOB: [birth year]  Ashley Gross is a 69 y.o. year old female who is a primary care patient of Einar Pheasant, MD. The CCM team was consulted for assistance with chronic disease management and care coordination needs.    Attempted to contact patient for medication management review. Left HIPAA compliant message for patient to return my call at their convenience.   Plan: - Will collaborate with Care Guide to outreach to schedule follow up with me  [name] Darnelle Maffucci, PharmD, [place], [place] Pharmacist [place] [phone]

## 2020-02-09 ENCOUNTER — Other Ambulatory Visit: Payer: Self-pay | Admitting: Pharmacy Technician

## 2020-02-09 ENCOUNTER — Ambulatory Visit: Payer: Self-pay | Admitting: Pharmacist

## 2020-02-09 NOTE — Progress Notes (Signed)
Reviewed information.  Agree with plan.    Dr Anslee Micheletti 

## 2020-02-09 NOTE — Progress Notes (Signed)
Reviewed.  Agree with plan   Dr Malaia Buchta 

## 2020-02-09 NOTE — Chronic Care Management (AMB) (Signed)
  Chronic Care Management   Note  02/09/2020 Name: Ashley Gross MRN: [id number] DOB: [birth year]  [name] is a 69 y.o. year old female who is a primary care patient of Einar Pheasant, MD. The CCM team was consulted for assistance with chronic disease management and care coordination needs.    Received message from scheduling care guide Noreene Larsson that patient mentioned that she had developed another yeast infection while on Jardiance, so she discontinued the medication as we previously discussed.   Attempted to contact patient, left HIPAA compliant message for her to return my call next week if she would like to speak sooner about her antihyperglycemic regimen, but that if she feels she is OK, we can talk about next steps in therapy at our next scheduled appointment.   [name] Darnelle Maffucci, PharmD, [place], CPP Clinical Pharmacist [place] [phone]

## 2020-02-09 NOTE — Patient Outreach (Signed)
[place] [facility]) Care Management  02/09/2020  Ashley Gross [birth year] [id number]    Care coordination call placed to both [employer] in regards to patient's Symbicort application and to [place] in regards to patient's [name] application.  Spoke to TIsh at [place] and patient is APPROVED 02/09/2020-11/01/2020. The medication order was sent to pharmacy today and Tish informed it should arrive to patient's home in the next 5-7 business days.  Spoke to [place] at [employer] and patient is APPROVED 02/07/2020-11/01/2020. Patient will be receiving 2 boxes delivered to the providers' office in the next 10-14 business days.  Will follow up with patient in 10-14 business days to confirm both medications were received and to discuss refill process.  Enzio Buchler P. Caterin Tabares, [place]  [phone]

## 2020-02-23 ENCOUNTER — Telehealth: Payer: Self-pay

## 2020-02-23 ENCOUNTER — Other Ambulatory Visit: Payer: Self-pay | Admitting: Pharmacy Technician

## 2020-02-23 NOTE — Patient Outreach (Signed)
[place] [facility]) Care Management  02/23/2020  Ashley Gross July 30, 1951 [id number]    Successful call placed to patient regarding patient assistance medication delivery of Symbicort from [employer]  and Tresiba from [employer], HIPAA identifiers verified.   Patient informed she received the 3 Symbicort inhalers. Discussed with patient the refill procedure which requires patient to phone the patient assistance company of [employer]  when she has around a 2-3 week supply of medication remaining to re order her medication. Patient verbalized understanding.  Informed patient she was approved with [employer] and hopefully her medication would be arriving to the provider's office soon, Informed her that a call from the provider's office would be made to her when it arrives. Patient verbalized understanding.  Follow up:  Will follow up with patient in 5-10 business days to confirm reciept of medicaiton.  Buckley Bradly P. Axcel Horsch, [place]  [phone]

## 2020-02-23 NOTE — Telephone Encounter (Signed)
tresiba received, labeled and placed in refrigerator for pick up. Left message for patient to call back.

## 2020-02-25 IMAGING — CR DG CHEST 2V
2 series · 2 of 2 positions shown · non-contrast
Comparison: Radiographs 08/13/2017. CT 09/11/2004.

CLINICAL DATA: Persistent cough and congestion for 6-7 months.
History of hypertension.

EXAM:
CHEST - 2 VIEW

[chest pa]
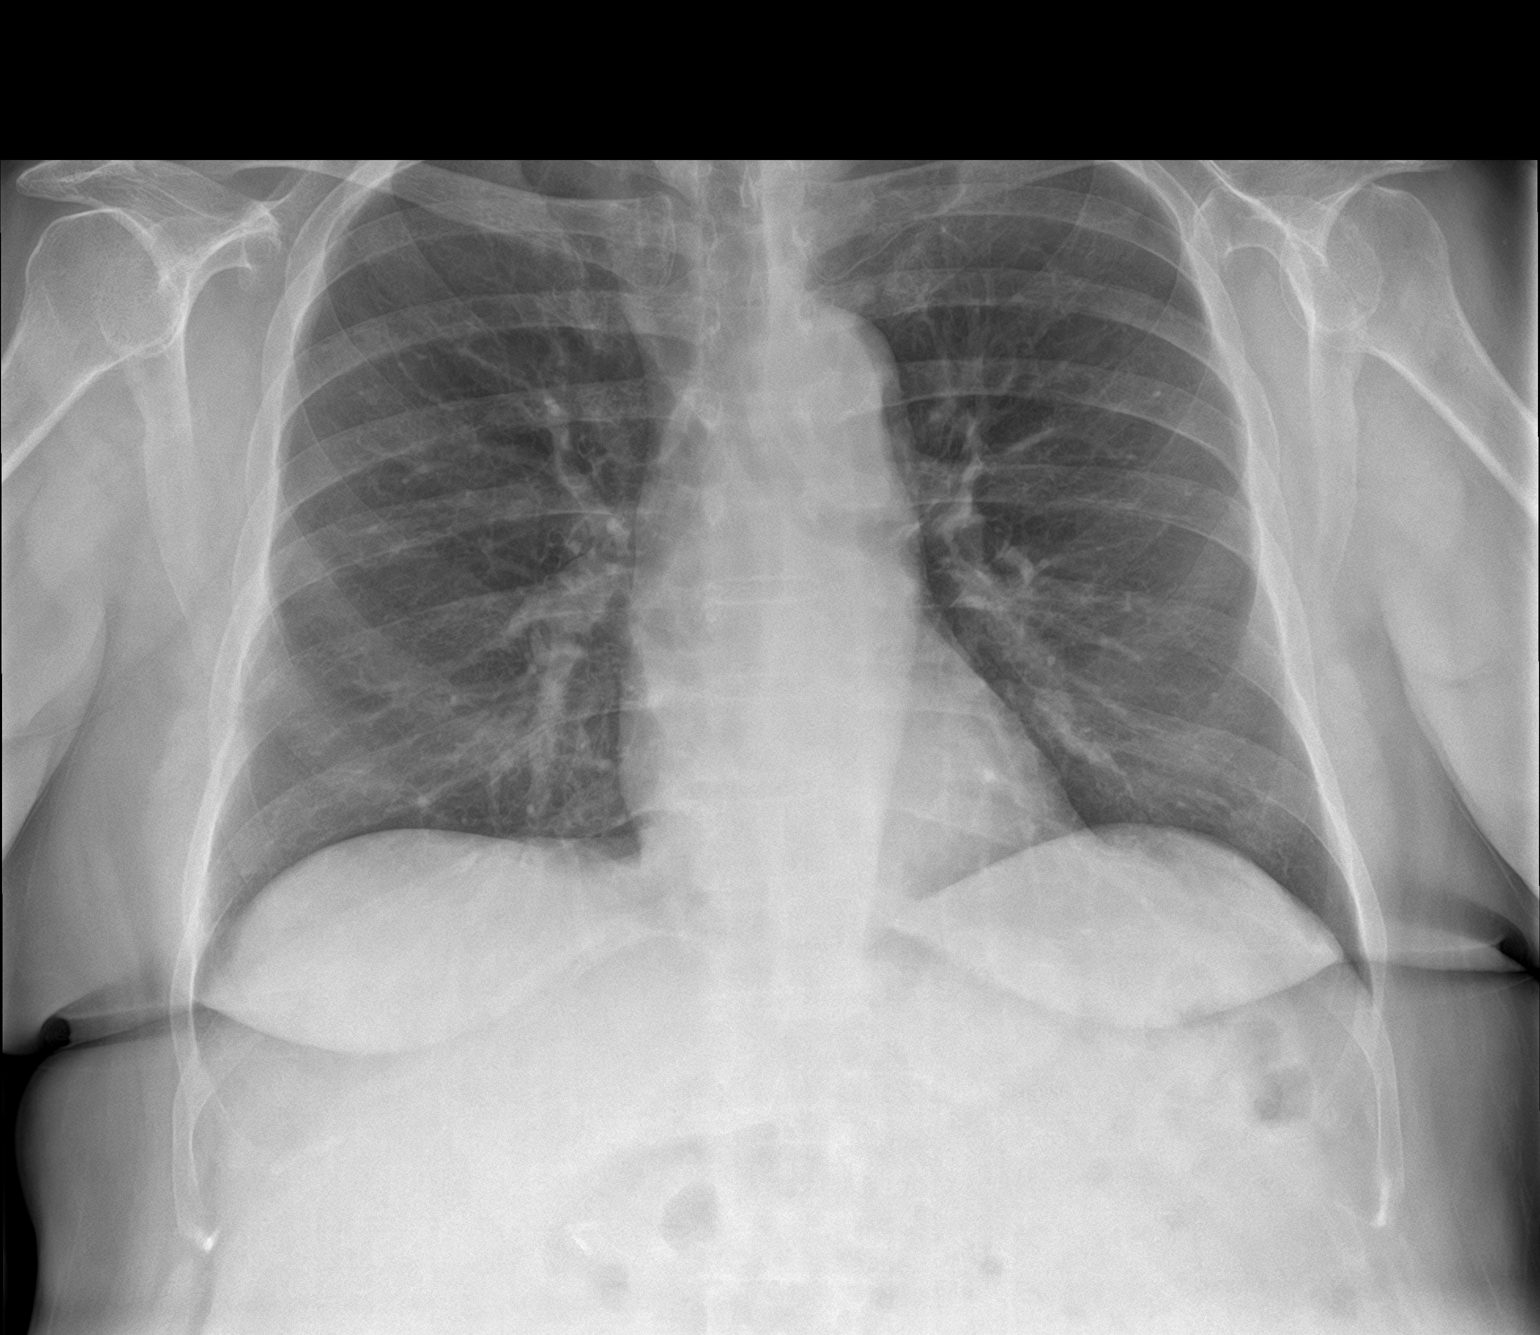

[chest lat]
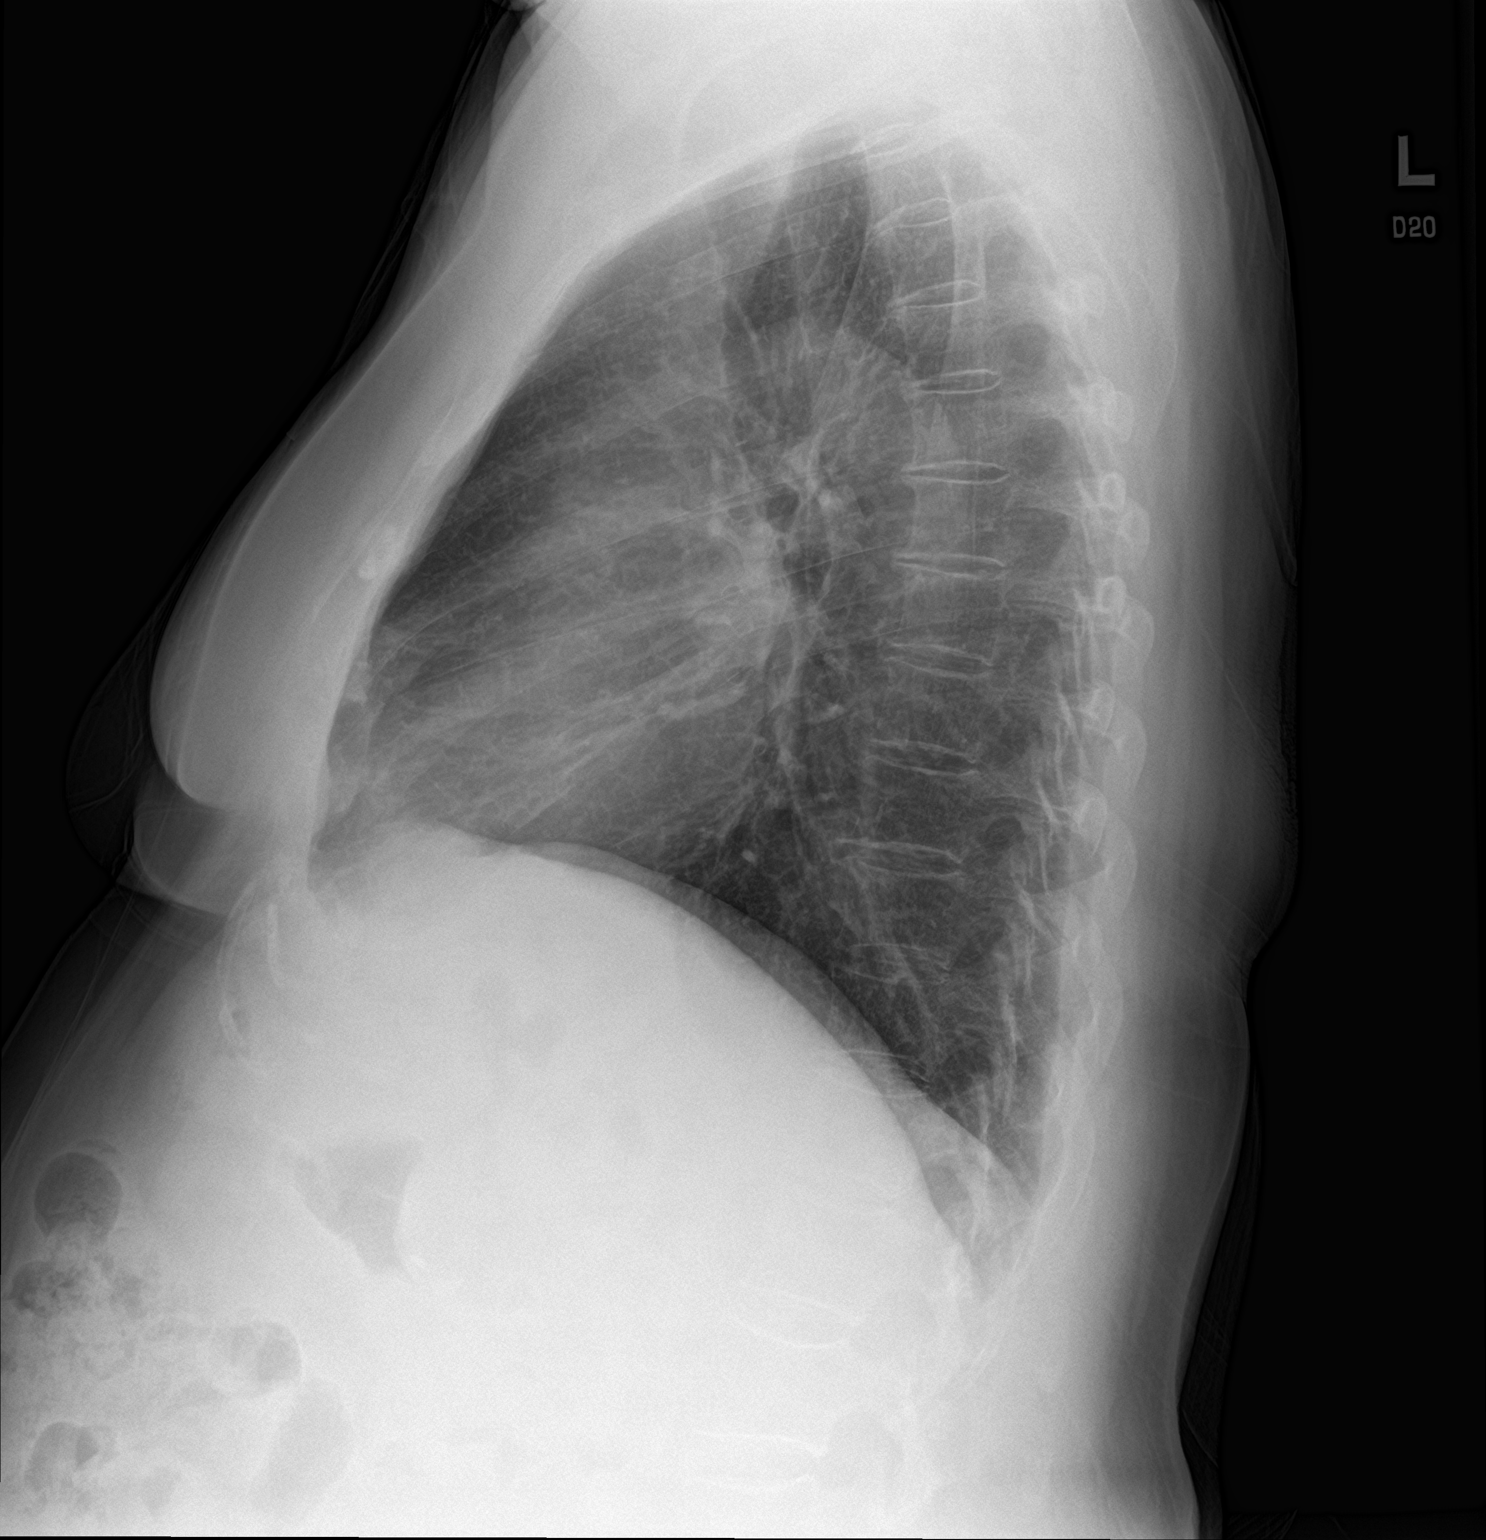

[2 of 2 positions shown; findings below may reference images not displayed]

FINDINGS: The heart size and mediastinal contours are normal. The lungs are
clear. There is no pleural effusion or pneumothorax. No acute
osseous findings are identified.
IMPRESSION: Stable chest.  No active cardiopulmonary process.

## 2020-02-26 ENCOUNTER — Other Ambulatory Visit: Payer: Self-pay | Admitting: Pharmacy Technician

## 2020-02-26 ENCOUNTER — Ambulatory Visit (INDEPENDENT_AMBULATORY_CARE_PROVIDER_SITE_OTHER): Payer: PPO | Admitting: Pharmacist

## 2020-02-26 DIAGNOSIS — E1121 Type 2 diabetes mellitus with diabetic nephropathy: Secondary | ICD-10-CM

## 2020-02-26 MED ORDER — OZEMPIC (0.25 OR 0.5 MG/DOSE) 2 MG/1.5ML ~~LOC~~ SOPN: PEN_INJECTOR | SUBCUTANEOUS | 2 refills | Status: DC

## 2020-02-26 NOTE — Patient Outreach (Signed)
[facility]) Care Management  02/26/2020  LAKEESHIA CARLEY Oct 22, 1951 [id number]  Successful call placed to patient regarding patient assistance medication delivery of Tresiba with [employer], HIPAA identifiers verified.   Informed patient that the medication was at the office ready for her to pick it up. Patient verbalized understanding.  Also discussed with patient the refill process. Informed patient to outreach provider's office when she has around 2-3 weeks supply of medication remaining to have them submit the needed paperwork to complete the refill process. Informed patient that [employer] will not accept refill requests form patients, the requests must be made by the provider's office. Patient verbalized understanding. Confirmed patient had name and number as she had no other questions or concerns.  Follow up:  Will route note to embedded pharmacist [name] Darnelle Maffucci for case closure as patient assistance is completed. Will remove myself form care team.  Luiz Ochoa. Bayan Kushnir, [place]  [phone]

## 2020-02-26 NOTE — Telephone Encounter (Signed)
Patient picked up [name] U200, 2 boxes of 5 pens each.

## 2020-02-26 NOTE — Chronic Care Management (AMB) (Signed)
Chronic Care Management   Follow Up Note   02/26/2020 Name: Ashley Gross MRN: [id number] DOB: 1951/07/31  Referred by: Einar Pheasant, MD Reason for referral : Chronic Care Management (Medication Management)   Ashley Gross is a 69 y.o. year old female who is a primary care patient of Einar Pheasant, MD. The CCM team was consulted for assistance with chronic disease management and care coordination needs.    Contacted patient for medication management review.  Review of patient status, including review of consultants reports, relevant laboratory and other test results, and collaboration with appropriate care team members and the patient's provider was performed as part of comprehensive patient evaluation and provision of chronic care management services.    SDOH (Social Determinants of Health) assessments performed: Yes See Care Plan activities for detailed interventions related to [facility])     Outpatient Encounter Medications as of 02/26/2020  Medication Sig Note  . insulin degludec (TRESIBA FLEXTOUCH) 100 UNIT/ML SOPN FlexTouch Pen Inject 0.2 mLs (20 Units total) into the skin daily.   Marland Kitchen albuterol (ACCUNEB) 0.63 MG/3ML nebulizer solution Take 3 mLs (0.63 mg total) by nebulization every 6 (six) hours as needed for wheezing. (Patient not taking: Reported on 01/04/2020) 12/21/2018: PRN, last used several months ago  . amLODipine (NORVASC) 5 MG tablet Take 1 tablet (5 mg total) by mouth 2 (two) times daily.   . budesonide-formoterol (SYMBICORT) 160-4.5 MCG/ACT inhaler Inhale 2 puffs into the lungs 2 (two) times daily.   . Calcium Carbonate-Vit D-Min (CALCIUM 600+D PLUS MINERALS) 600-400 MG-UNIT TABS Take by mouth.   . cetirizine (ZYRTEC) 10 MG tablet Take 10 mg by mouth daily.   . Cholecalciferol (VITAMIN D-3) 1000 UNITS CAPS Take 1 capsule by mouth daily.   Marland Kitchen dicyclomine (BENTYL) 20 MG tablet Take 1 tablet (20 mg total) by mouth 4 (four) times daily -  before meals and at bedtime for  30 days. 01/04/2020: PRN stomach spasms  . diphenhydrAMINE (BENADRYL) 25 mg capsule Take by mouth.   . Fluticasone-Salmeterol (ADVAIR DISKUS) 250-50 MCG/DOSE AEPB Inhale 1 puff into the lungs 2 (two) times daily.   . Insulin Pen Needle (BD PEN NEEDLE NANO U/F) 32G X 4 MM MISC Use to inject insulin daily   . ipratropium (ATROVENT HFA) 17 MCG/ACT inhaler Inhale 2 puffs into the lungs every 6 (six) hours. 01/04/2020: Using PRN  . loperamide (IMODIUM) 2 MG capsule Take by mouth. 01/04/2020: Taking 1 QAM  . losartan (COZAAR) 100 MG tablet TAKE 1 TABLET(100 MG) BY MOUTH DAILY   . omeprazole (PRILOSEC) 20 MG capsule Take 1 capsule (20 mg total) by mouth 2 (two) times daily.   . ONE TOUCH ULTRA TEST test strip PATIENT NEEDS NEW METER STRIPS AND LANCETS FOR ONE TOUCH. HER INSURANCE NO LONGER COVERS ACCUCHEK.   Marland Kitchen potassium chloride (KLOR-CON) 10 MEQ tablet TAKE 1 TABLET(10 MEQ) BY MOUTH TWICE DAILY   . rosuvastatin (CRESTOR) 10 MG tablet Take 1 tablet (10 mg total) by mouth daily.   . Semaglutide,0.25 or 0.5MG/DOS, (OZEMPIC, 0.25 OR 0.5 MG/DOSE,) 2 MG/1.5ML SOPN Inject 0.25 mg once weekly for 4 weeks then increase to 0.5 mg weekly   . triamterene-hydrochlorothiazide (MAXZIDE-25) 37.5-25 MG tablet Take 0.5 tablets by mouth daily.   . [DISCONTINUED] empagliflozin (JARDIANCE) 10 MG TABS tablet Take 10 mg by mouth daily before breakfast.    No facility-administered encounter medications on file as of 02/26/2020.     Objective:   Goals Addressed  This Visit's Progress     Patient Stated   . "I want to work on sugars" (pt-stated)       CARE PLAN ENTRY (see longtitudinal plan of care for additional care plan information)  Current Barriers:  . Diabetes: uncontrolled; complicated by chronic medical conditions including CKD, HTN, asthma, most recent A1c 7.4% o Self d/c Jardiance d/t recurrent yeast infections . Most recent eGFR: 56 . Current antihyperglycemic regimen: Tresiba 20 units daily o  APPROVED for Tresiba assistance through 10/01/20 . Cardiovascular risk reduction: o Current hypertensive regimen: amlodipine 5 mg BID, losartan 100 mg QPM, triamterene/HCTZ 37.5/25 1/2 tab QAM o Current hyperlipidemia regimen: rosuvastatin 10 mg daily; las LDL at goal <70 o Current antiplatelet regimen: none . Asthma/congestion: cetirizine 10 mg daily; Advair 250/50 mcg BID, PRN use of albuterol in nebulizer and Atrovent inhaler. Has not needed either of these PRN medications in a few weeks . Potassium 10 mEq BID. Last K level 4.0 mEq  Pharmacist Clinical Goal(s):  Marland Kitchen Over the next 90 days, patient will work with PharmD and primary care provider to address optimized medication management  Interventions: . Comprehensive medication review performed, medication list updated in electronic medical record . Inter-disciplinary care team collaboration (see longitudinal plan of care) . Reviewed GLP1 mechanism, benefits. Patient amenable to trial. Start Ozempic 0.25 mg once weekly x 4 weeks, then increase to 0.5 mg weekly. Provided with sample x 1 pen.  . Patient also picking up [name] patient assistance supply today. If Ozempic is tolerated, we will plan to send prescription to [employer] assistance as well.  Patient Self Care Activities:  . Patient will check blood glucose BID, document, and provide at future appointments . Patient will take medications as prescribed . Patient will report any questions or concerns to provider   Please see past updates related to this goal by clicking on the "Past Updates" button in the selected goal          Plan:  - Scheduled f/u call in ~4 weeks  [name] Darnelle Maffucci, PharmD, [place], [place] Pharmacist [place] [phone]

## 2020-02-26 NOTE — Patient Instructions (Addendum)
Ashley Gross,   It was great talking to you!  Continue Tresiba 20 units daily.   Start Ozempic 0.25 mg once weekly for 4 weeks, then increase to 0.5 mg weekly.   Please call me if you have significant nausea, vomiting, or diarrhea.   Visit Information  Goals Addressed            This Visit's Progress     Patient Stated   . "I want to work on sugars" (pt-stated)       CARE PLAN ENTRY (see longtitudinal plan of care for additional care plan information)  Current Barriers:  . Diabetes: uncontrolled; complicated by chronic medical conditions including CKD, HTN, asthma, most recent A1c 7.4% o Self d/c Jardiance d/t recurrent yeast infections . Most recent eGFR: 56 . Current antihyperglycemic regimen: Tresiba 20 units daily o APPROVED for Tresiba assistance through 10/01/20 . Cardiovascular risk reduction: o Current hypertensive regimen: amlodipine 5 mg BID, losartan 100 mg QPM, triamterene/HCTZ 37.5/25 1/2 tab QAM o Current hyperlipidemia regimen: rosuvastatin 10 mg daily; las LDL at goal <70 o Current antiplatelet regimen: none . Asthma/congestion: cetirizine 10 mg daily; Advair 250/50 mcg BID, PRN use of albuterol in nebulizer and Atrovent inhaler. Has not needed either of these PRN medications in a few weeks . Potassium 10 mEq BID. Last K level 4.0 mEq  Pharmacist Clinical Goal(s):  Marland Kitchen Over the next 90 days, patient will work with PharmD and primary care provider to address optimized medication management  Interventions: . Comprehensive medication review performed, medication list updated in electronic medical record . Inter-disciplinary care team collaboration (see longitudinal plan of care) . Reviewed GLP1 mechanism, benefits. Patient amenable to trial. Start Ozempic 0.25 mg once weekly x 4 weeks, then increase to 0.5 mg weekly. Provided with sample x 1 pen.  . Patient also picking up [name] patient assistance supply today. If Ozempic is tolerated, we will plan to send  prescription to [employer] assistance as well.  Patient Self Care Activities:  . Patient will check blood glucose BID, document, and provide at future appointments . Patient will take medications as prescribed . Patient will report any questions or concerns to provider   Please see past updates related to this goal by clicking on the "Past Updates" button in the selected goal         Patient verbalizes understanding of instructions provided today.   Plan:  - Scheduled f/u call in ~4 weeks  [name] Darnelle Maffucci, PharmD, [place], [place] Pharmacist [place] [phone]

## 2020-02-27 NOTE — Progress Notes (Signed)
Reviewed information.  Agree with plan.    Dr Taniya Dasher 

## 2020-03-04 ENCOUNTER — Other Ambulatory Visit: Payer: Self-pay | Admitting: Internal Medicine

## 2020-03-05 ENCOUNTER — Encounter: Payer: Self-pay | Admitting: Internal Medicine

## 2020-03-05 ENCOUNTER — Ambulatory Visit (INDEPENDENT_AMBULATORY_CARE_PROVIDER_SITE_OTHER): Payer: PPO | Admitting: Internal Medicine

## 2020-03-05 ENCOUNTER — Other Ambulatory Visit: Payer: Self-pay

## 2020-03-05 VITALS — BP 124/70 | HR 85 | Temp 97.6°F | Resp 16 | Ht 64.0 in | Wt 190.0 lb

## 2020-03-05 DIAGNOSIS — E559 Vitamin D deficiency, unspecified: Secondary | ICD-10-CM

## 2020-03-05 DIAGNOSIS — F439 Reaction to severe stress, unspecified: Secondary | ICD-10-CM | POA: Diagnosis not present

## 2020-03-05 DIAGNOSIS — I10 Essential (primary) hypertension: Secondary | ICD-10-CM | POA: Diagnosis not present

## 2020-03-05 DIAGNOSIS — E7800 Pure hypercholesterolemia, unspecified: Secondary | ICD-10-CM | POA: Diagnosis not present

## 2020-03-05 DIAGNOSIS — K529 Noninfective gastroenteritis and colitis, unspecified: Secondary | ICD-10-CM

## 2020-03-05 DIAGNOSIS — K219 Gastro-esophageal reflux disease without esophagitis: Secondary | ICD-10-CM | POA: Diagnosis not present

## 2020-03-05 DIAGNOSIS — Z794 Long term (current) use of insulin: Secondary | ICD-10-CM

## 2020-03-05 DIAGNOSIS — Z0000 Encounter for general adult medical examination without abnormal findings: Secondary | ICD-10-CM

## 2020-03-05 DIAGNOSIS — L4050 Arthropathic psoriasis, unspecified: Secondary | ICD-10-CM | POA: Diagnosis not present

## 2020-03-05 DIAGNOSIS — E118 Type 2 diabetes mellitus with unspecified complications: Secondary | ICD-10-CM | POA: Diagnosis not present

## 2020-03-05 DIAGNOSIS — K5140 Inflammatory polyps of colon without complications: Secondary | ICD-10-CM

## 2020-03-05 NOTE — Assessment & Plan Note (Addendum)
Physical today 03/05/20.  Colonoscopy 01/2019  - as outlined.  Mammogram 11/11/220 - Birads I.

## 2020-03-05 NOTE — Progress Notes (Signed)
Patient ID: Ashley Gross, female   DOB: [birth year], 69 y.o.   MRN: [id number]   Subjective:    Patient ID: Ashley Gross, female    DOB: 1951-07-07, 69 y.o.   MRN: [id number]  HPI This visit occurred during the SARS-CoV-2 public health emergency.  Safety protocols were in place, including screening questions prior to the visit, additional usage of staff PPE, and extensive cleaning of exam room while observing appropriate contact time as indicated for disinfecting solutions.  Patient here for her physical exam.  States she has been doing relatively well.  Some increased stress related to her husband's health issues.  Overall appears to be handling things relatively well.  Just started ozempic.  Took firs dose last Tuesday am. After ate her evening meal, noted nausea - which last overnight. Next am - vomiting x 1.  Felt better after.  Would like to change her day of taking to Thursday.  Does not have to work Friday.  Given was her first dose, she does want to try to stay on ozempic and follow GI symptoms.  Will notify me if persistent.  No chest pain reported.  Breathing stable.  Still intermittent bowel issues.  Sees GI.  Salads aggravate.  Takes imodium.  Blood sugars have been running a little higher - started ozempic as outlined.    Past Medical History:  Diagnosis Date  . Abnormal liver function test 10/06/2012  . Anemia, unspecified   . Asthma   . Colitis   . Complication of anesthesia    asthma attack after shoulder surgery  . Diabetes mellitus ([place])   . Esophagitis   . H/O hiatal hernia   . Heart murmur   . Hx of arteriovenous malformation (AVM)   . Hypertension   . IBS (irritable bowel syndrome)   . Nephrolithiasis   . Pernicious anemia   . Personal history of colonic polyps 02/10/2013   02/08/13 colonoscopy - question of rectal varices, two 3-12m polyps in the sigmoid colon, mass at the hepatic flexure, one 538mpolyp at the hepatic flexure, nodule at the ileocecal valve,  congested mucusa in the entire colon, flattened villi mucosa in the terminal ileum.    . Pneumonia   . Psoriatic arthritis (HCC)    s/p penicillamine, plaquenil, MTX, sulfasalazine.  s/p Embrel, Humira,.  Iritis.    . Pure hypercholesterolemia    Past Surgical History:  Procedure Laterality Date  . ABDOMINAL HYSTERECTOMY  1981   ovaries left in place  . ANKLE SURGERY     right  . BUNIONECTOMY    . CHOLECYSTECTOMY  1989  . COLONOSCOPY WITH PROPOFOL N/A 12/27/2017   Procedure: COLONOSCOPY WITH PROPOFOL;  Surgeon: VaLin LandsmanMD;  [facility];  Service: Gastroenterology;  Laterality: N/A;  . COLONOSCOPY WITH PROPOFOL N/A 01/04/2019   Procedure: COLONOSCOPY WITH PROPOFOL;  Surgeon: VaLin LandsmanMD;  [place];  Laterality: N/A;  Diabetic - oral and injectable  . ESOPHAGOGASTRODUODENOSCOPY (EGD) WITH PROPOFOL N/A 12/27/2017   Procedure: ESOPHAGOGASTRODUODENOSCOPY (EGD) WITH PROPOFOL;  Surgeon: VaLin LandsmanMD;  [place] [facility] [place]: [place]ology;  Laterality: N/A;  . HAND SURGERY     right  . HEEL SPUR SURGERY    . NOSE SURGERY     x2  . POLYPECTOMY  01/04/2019   Procedure: POLYPECTOMY;  Surgeon: VaLin LandsmanMD;  [facility] Service: [facility];;  . SHOULDER ARTHROSCOPY WITH OPEN ROTATOR CUFF REPAIR  Left 09/20/2018   Procedure: SHOULDER ARTHROSCOPY WITH OPEN ROTATOR CUFF REPAIR;  Surgeon: Corky Mull, MD;  [facility];  Service: Orthopedics;  Laterality: Left;  . SHOULDER CLOSED REDUCTION Left 12/21/2018   Procedure: MANIPULATION UNDER ANESTHESIA WITH STEROID INJECTION;  Surgeon: Corky Mull, MD;  Location: [place];  Service: Orthopedics;  Laterality: Left;  Diabetic - insulin and oral meds  . UMBILICAL HERNIA REPAIR     Family History  Problem Relation Age of Onset  . Diabetes Other   . Hypertension Other   . Breast cancer Neg Hx   . Colon  cancer Neg Hx    Social History   Socioeconomic History  . Marital status: Married    Spouse name: Not on file  . Number of children: 2  . Years of education: Not on file  . Highest education level: Not on file  Occupational History  . Not on file  Tobacco Use  . Smoking status: Never Smoker  . Smokeless tobacco: Never Used  Substance and Sexual Activity  . Alcohol use: No    Alcohol/week: 0.0 standard drinks  . Drug use: No  . Sexual activity: Not Currently  Other Topics Concern  . Not on file  Social History Narrative   She is married and has two children   Social Determinants of Health   Financial Resource Strain: High Risk  . Difficulty of Paying Living Expenses: Hard  Food Insecurity:   . Worried About Charity fundraiser in the Last Year:   . Arboriculturist in the Last Year:   Transportation Needs:   . Film/video editor (Medical):   Marland Kitchen Lack of Transportation (Non-Medical):   Physical Activity:   . Days of Exercise per Week:   . Minutes of Exercise per Session:   Stress:   . Feeling of Stress :   Social Connections:   . Frequency of Communication with Friends and Family:   . Frequency of Social Gatherings with Friends and Family:   . Attends Religious Services:   . Active Member of Clubs or Organizations:   . Attends Archivist Meetings:   Marland Kitchen Marital Status:     Outpatient Encounter Medications as of 03/05/2020  Medication Sig  . albuterol (ACCUNEB) 0.63 MG/3ML nebulizer solution Take 3 mLs (0.63 mg total) by nebulization every 6 (six) hours as needed for wheezing. (Patient not taking: Reported on 01/04/2020)  . amLODipine (NORVASC) 5 MG tablet TAKE 1 TABLET(5 MG) BY MOUTH TWICE DAILY  . budesonide-formoterol (SYMBICORT) 160-4.5 MCG/ACT inhaler Inhale 2 puffs into the lungs 2 (two) times daily.  . Calcium Carbonate-Vit D-Min (CALCIUM 600+D PLUS MINERALS) 600-400 MG-UNIT TABS Take by mouth.  . cetirizine (ZYRTEC) 10 MG tablet Take 10 mg by mouth  daily.  . Cholecalciferol (VITAMIN D-3) 1000 UNITS CAPS Take 1 capsule by mouth daily.  Marland Kitchen dicyclomine (BENTYL) 20 MG tablet Take 1 tablet (20 mg total) by mouth 4 (four) times daily -  before meals and at bedtime for 30 days.  . diphenhydrAMINE (BENADRYL) 25 mg capsule Take by mouth.  . Fluticasone-Salmeterol (ADVAIR DISKUS) 250-50 MCG/DOSE AEPB Inhale 1 puff into the lungs 2 (two) times daily.  . insulin degludec (TRESIBA FLEXTOUCH) 100 UNIT/ML SOPN FlexTouch Pen Inject 0.2 mLs (20 Units total) into the skin daily.  . Insulin Pen Needle (BD PEN NEEDLE NANO U/F) 32G X 4 MM MISC Use to inject insulin daily  . ipratropium (ATROVENT HFA) 17 MCG/ACT inhaler Inhale  2 puffs into the lungs every 6 (six) hours.  Marland Kitchen loperamide (IMODIUM) 2 MG capsule Take by mouth.  . losartan (COZAAR) 100 MG tablet TAKE 1 TABLET BY MOUTH EVERY DAY  . omeprazole (PRILOSEC) 20 MG capsule Take 1 capsule (20 mg total) by mouth 2 (two) times daily.  . ONE TOUCH ULTRA TEST test strip PATIENT NEEDS NEW METER STRIPS AND LANCETS FOR ONE TOUCH. HER INSURANCE NO LONGER COVERS ACCUCHEK.  Marland Kitchen potassium chloride (KLOR-CON) 10 MEQ tablet TAKE 1 TABLET(10 MEQ) BY MOUTH TWICE DAILY  . Semaglutide,0.25 or 0.5MG/DOS, (OZEMPIC, 0.25 OR 0.5 MG/DOSE,) 2 MG/1.5ML SOPN Inject 0.25 mg once weekly for 4 weeks then increase to 0.5 mg weekly  . triamterene-hydrochlorothiazide (MAXZIDE-25) 37.5-25 MG tablet Take 0.5 tablets by mouth daily.  . [DISCONTINUED] rosuvastatin (CRESTOR) 10 MG tablet Take 1 tablet (10 mg total) by mouth daily.   No facility-administered encounter medications on file as of 03/05/2020.    Review of Systems  Constitutional: Negative for appetite change and unexpected weight change.  HENT: Negative for congestion and sinus pressure.   Eyes: Negative for pain and visual disturbance.  Respiratory: Negative for cough, chest tightness and shortness of breath.   Cardiovascular: Negative for chest pain, palpitations and leg swelling.    Gastrointestinal:       Had the nausea and vomiting related to ozempic as outlined.  No increased abdominal pain.  Intermittent flares - bowel change.   Genitourinary: Negative for difficulty urinating and dysuria.  Musculoskeletal: Negative for joint swelling and myalgias.  Skin: Negative for color change and rash.  Neurological: Negative for dizziness, light-headedness and headaches.  Hematological: Negative for adenopathy. Does not bruise/bleed easily.  Psychiatric/Behavioral: Negative for agitation and dysphoric mood.       Objective:    Physical Exam Vitals reviewed.  Constitutional:      General: She is not in acute distress.    Appearance: Normal appearance. She is well-developed.  HENT:     Head: Normocephalic and atraumatic.     Right Ear: External ear normal.     Left Ear: External ear normal.  Eyes:     General: No scleral icterus.       Right eye: No discharge.        Left eye: No discharge.     Conjunctiva/sclera: Conjunctivae normal.  Neck:     Thyroid: No thyromegaly.  Cardiovascular:     Rate and Rhythm: Normal rate and regular rhythm.  Pulmonary:     Effort: No tachypnea, accessory muscle usage or respiratory distress.     Breath sounds: Normal breath sounds. No decreased breath sounds or wheezing.  Chest:     Breasts:        Right: No inverted nipple, mass, nipple discharge or tenderness (no axillary adenopathy).        Left: No inverted nipple, mass, nipple discharge or tenderness (no axilarry adenopathy).  Abdominal:     General: Bowel sounds are normal.     Palpations: Abdomen is soft.     Tenderness: There is no abdominal tenderness.  Musculoskeletal:        General: No swelling or tenderness.     Cervical back: Neck supple. No tenderness.  Lymphadenopathy:     Cervical: No cervical adenopathy.  Skin:    Findings: No erythema or rash.  Neurological:     Mental Status: She is alert and oriented to person, place, and time.  Psychiatric:         Behavior: Behavior  normal.     BP 124/70   Pulse 85   Temp 97.6 F (36.4 C)   Resp 16   Ht '5\' 4"'  (1.626 m)   Wt 190 lb (86.2 kg)   SpO2 98%   BMI 32.61 kg/m  Wt Readings from Last 3 Encounters:  03/05/20 190 lb (86.2 kg)  11/21/19 178 lb (80.7 kg)  07/20/19 193 lb (87.5 kg)     Lab Results  Component Value Date   WBC 6.0 04/19/2019   HGB 12.9 04/19/2019   HCT 39.2 04/19/2019   PLT 240.0 04/19/2019   GLUCOSE 104 (H) 11/28/2019   CHOL 119 11/28/2019   TRIG 113 11/28/2019   HDL 48 11/28/2019   LDLDIRECT 74.0 04/19/2019   LDLCALC 48 11/28/2019   ALT 20 11/28/2019   AST 26 11/28/2019   NA 142 11/28/2019   K 4.0 11/28/2019   CL 107 11/28/2019   CREATININE 1.03 (H) 11/28/2019   BUN 18 11/28/2019   CO2 26 11/28/2019   TSH 2.35 08/21/2019   HGBA1C 7.4 (H) 11/28/2019   MICROALBUR 18.2 (H) 08/21/2019    DG Chest 2 View  Result Date: 11/28/2019 CLINICAL DATA:  Persistent cough and congestion for 6-7 months. History of hypertension. EXAM: CHEST - 2 VIEW COMPARISON:  Radiographs 08/13/2017. CT 09/11/2004. FINDINGS: The heart size and mediastinal contours are normal. The lungs are clear. There is no pleural effusion or pneumothorax. No acute osseous findings are identified. IMPRESSION: Stable chest.  No active cardiopulmonary process. Electronically Signed   By: Richardean Sale M.D.   On: 11/28/2019 10:03       Assessment & Plan:   Problem List Items Addressed This Visit    Chronic diarrhea    Followed by Dr Marius Ditch.  S/p colonoscopy 01/2019.  Multiple polyps removed.  Takes imodium prn.  Stable.        Controlled type 2 diabetes mellitus with complication, with long-term current use of insulin (HCC)    Low carb diet and exercise.  Sugars have been elevated.  Just started on ozempic.  Follow for tolerance.  Follow met b and a1c.       Relevant Orders   Hemoglobin A1c   GERD (gastroesophageal reflux disease)    Upper symptoms appear to be controlled on omeprazole.   Follow.        Healthcare maintenance    Physical today 03/05/20.  Colonoscopy 01/2019  - as outlined.  Mammogram 11/11/220 - Birads I.       Hypercholesterolemia    On crestor.  Low cholesterol diet and exercise.  Follow lipid panel and liver function tests.        Relevant Orders   Lipid panel   Hepatic function panel   Hypertension    Blood pressure doing well on triam/hctz, amlodipine and losartan.  Follow pressures.  Follow metabolic panel.       Relevant Orders   CBC with Differential/Platelet   Basic metabolic panel   Inflammatory polyps of colon [facility])    S/p colonoscopy with removal of multiple polyps (01/2019).  Continue f/u with GI.        Psoriatic arthritis ([place])    Followed by Dr Jefm Bryant.  Stable.       Stress    Increased stress as outlined.  Overall appears to be handling things well.  Follow.  Working a new job.  Stress better.        Vitamin D deficiency    Check vitamin D level with  next labs.        Relevant Orders   VITAMIN D 25 Hydroxy (Vit-D Deficiency, Fractures)       Einar Pheasant, MD

## 2020-03-07 ENCOUNTER — Telehealth: Payer: Self-pay | Admitting: Internal Medicine

## 2020-03-07 MED ORDER — ROSUVASTATIN CALCIUM 10 MG PO TABS: 10.0000 mg | ORAL_TABLET | Freq: Every day | ORAL | 1 refills | Status: DC

## 2020-03-07 NOTE — Addendum Note (Signed)
Addended by: Elpidio Galea T on: 03/07/2020 02:42 PM   Modules accepted: Orders

## 2020-03-07 NOTE — Telephone Encounter (Signed)
Pt needs a refill on rosuvastatin (CRESTOR) 10 MG tablet sent to [facility]

## 2020-03-11 ENCOUNTER — Encounter: Payer: Self-pay | Admitting: Internal Medicine

## 2020-03-11 NOTE — Assessment & Plan Note (Signed)
Check vitamin D level with next labs.  ?

## 2020-03-11 NOTE — Assessment & Plan Note (Signed)
Followed by Dr Kernodle.  Stable.  

## 2020-03-11 NOTE — Assessment & Plan Note (Signed)
Low carb diet and exercise.  Sugars have been elevated.  Just started on ozempic.  Follow for tolerance.  Follow met b and a1c.

## 2020-03-11 NOTE — Assessment & Plan Note (Signed)
Upper symptoms appear to be controlled on omeprazole.  Follow.

## 2020-03-11 NOTE — Assessment & Plan Note (Signed)
Increased stress as outlined.  Overall appears to be handling things well.  Follow.  Working a new job.  Stress better.

## 2020-03-11 NOTE — Assessment & Plan Note (Signed)
Followed by Dr Marius Ditch.  S/p colonoscopy 01/2019.  Multiple polyps removed.  Takes imodium prn.  Stable.

## 2020-03-11 NOTE — Assessment & Plan Note (Signed)
Blood pressure doing well on triam/hctz, amlodipine and losartan.  Follow pressures.  Follow metabolic panel.  

## 2020-03-11 NOTE — Assessment & Plan Note (Signed)
On crestor.  Low cholesterol diet and exercise.  Follow lipid panel and liver function tests.   

## 2020-03-11 NOTE — Assessment & Plan Note (Signed)
S/p colonoscopy with removal of multiple polyps (01/2019).  Continue f/u with GI.

## 2020-03-12 ENCOUNTER — Other Ambulatory Visit: Payer: Self-pay

## 2020-03-12 ENCOUNTER — Ambulatory Visit (INDEPENDENT_AMBULATORY_CARE_PROVIDER_SITE_OTHER): Payer: PPO

## 2020-03-12 DIAGNOSIS — E538 Deficiency of other specified B group vitamins: Secondary | ICD-10-CM | POA: Diagnosis not present

## 2020-03-12 MED ORDER — CYANOCOBALAMIN 1000 MCG/ML IJ SOLN
1000.0000 ug | Freq: Once | INTRAMUSCULAR | Status: AC
  Administered 2020-03-12: 1000 ug via INTRAMUSCULAR

## 2020-03-12 NOTE — Progress Notes (Addendum)
Patient presented for B 12 injection to right deltoid, patient voiced no concerns nor showed any signs of distress during injection.  Reviewed.  Dr Scott 

## 2020-03-21 ENCOUNTER — Telehealth: Payer: Self-pay | Admitting: Internal Medicine

## 2020-03-21 ENCOUNTER — Ambulatory Visit (INDEPENDENT_AMBULATORY_CARE_PROVIDER_SITE_OTHER): Payer: PPO | Admitting: Pharmacist

## 2020-03-21 DIAGNOSIS — Z794 Long term (current) use of insulin: Secondary | ICD-10-CM | POA: Diagnosis not present

## 2020-03-21 DIAGNOSIS — E1165 Type 2 diabetes mellitus with hyperglycemia: Secondary | ICD-10-CM

## 2020-03-21 DIAGNOSIS — E1121 Type 2 diabetes mellitus with diabetic nephropathy: Secondary | ICD-10-CM

## 2020-03-21 MED ORDER — POTASSIUM CHLORIDE ER 10 MEQ PO TBCR: EXTENDED_RELEASE_TABLET | ORAL | 1 refills | Status: DC

## 2020-03-21 NOTE — Telephone Encounter (Signed)
rx ok'd for kcl #180 with one refill.

## 2020-03-21 NOTE — Patient Instructions (Signed)
Visit Information  Goals Addressed            This Visit's Progress     Patient Stated   . "I want to work on sugars" (pt-stated)       CARE PLAN ENTRY (see longtitudinal plan of care for additional care plan information)  Current Barriers:  . Diabetes: uncontrolled; complicated by chronic medical conditions including CKD, HTN, asthma, most recent A1c 7.4% o Reports she feels like she has lost weight (tried on a pair of pants yesterday that she couldn't fit into last year) . Most recent eGFR: 56 mL/min . Current antihyperglycemic regimen: Tresiba 20 units daily, Ozempic 0.25 mg weekly (x3 doses) o APPROVED for Tresiba assistance through 10/01/20 o Hx Jardiance, recurrent UTIs . Current glucose readings:  o Fastings: ranging from 110-160s . Cardiovascular risk reduction: o Current hypertensive regimen: amlodipine 5 mg BID, losartan 100 mg QPM, triamterene/HCTZ 37.5/25 1/2 tab QAM; BP at goal on last visit  o Current hyperlipidemia regimen: rosuvastatin 10 mg daily; last LDL at goal <70 o Current antiplatelet regimen: none . Asthma/congestion: cetirizine 10 mg daily; Advair 250/50 mcg BID, PRN use of albuterol in nebulizer and Atrovent inhaler. Has not needed either of these PRN medications in a few weeks . Potassium 10 mEq BID. Last K level 4.0 mEq  Pharmacist Clinical Goal(s):  Marland Kitchen Over the next 90 days, patient will work with PharmD and primary care provider to address optimized medication management  Interventions: . Comprehensive medication review performed, medication list updated in electronic medical record . Inter-disciplinary care team collaboration (see longitudinal plan of care) . Continue Ozempic as prescribed. Continue Tresiba 20 units at this time.  . Printed provider portion for [employer] through [employer]. Will fax in to add Ozempic to her already prescribed [employer] approval for [name].   Patient Self Care Activities:  . Patient will check blood glucose  BID, document, and provide at future appointments . Patient will take medications as prescribed . Patient will report any questions or concerns to provider   Please see past updates related to this goal by clicking on the "Past Updates" button in the selected goal         Patient verbalizes understanding of instructions provided today.    Plan:  - Scheduled f/u call in ~ 5 weeks  [name] Darnelle Maffucci, PharmD, [place], [place] Pharmacist [place] [phone]

## 2020-03-21 NOTE — Telephone Encounter (Signed)
Refill request for potassium, last seen 03-05-20, last filled 09-13-19.  Please advise. Potassium last checked: 11-28-19 Results: 4.0

## 2020-03-21 NOTE — Telephone Encounter (Signed)
Pt needs a refill on potassium chloride (KLOR-CON) 10 MEQ tablet sent to [facility]

## 2020-03-21 NOTE — Chronic Care Management (AMB) (Signed)
Chronic Care Management   Follow Up Note   03/21/2020 Name: Ashley Gross MRN: [id number] DOB: [birth year]  Referred by: Einar Pheasant, MD Reason for referral : Chronic Care Management (Medication Management)   Ashley Gross is a 69 y.o. year old female who is a primary care patient of Einar Pheasant, MD. The CCM team was consulted for assistance with chronic disease management and care coordination needs.    Contacted patient for medication management review.   Review of patient status, including review of consultants reports, relevant laboratory and other test results, and collaboration with appropriate care team members and the patient's provider was performed as part of comprehensive patient evaluation and provision of chronic care management services.    SDOH (Social Determinants of Health) assessments performed: Yes See Care Plan activities for detailed interventions related to [facility])     Outpatient Encounter Medications as of 03/21/2020  Medication Sig Note  . Semaglutide,0.25 or 0.5MG/DOS, (OZEMPIC, 0.25 OR 0.5 MG/DOSE,) 2 MG/1.5ML SOPN Inject 0.25 mg once weekly for 4 weeks then increase to 0.5 mg weekly   . albuterol (ACCUNEB) 0.63 MG/3ML nebulizer solution Take 3 mLs (0.63 mg total) by nebulization every 6 (six) hours as needed for wheezing. (Patient not taking: Reported on 01/04/2020) 12/21/2018: PRN, last used several months ago  . amLODipine (NORVASC) 5 MG tablet TAKE 1 TABLET(5 MG) BY MOUTH TWICE DAILY   . budesonide-formoterol (SYMBICORT) 160-4.5 MCG/ACT inhaler Inhale 2 puffs into the lungs 2 (two) times daily.   . Calcium Carbonate-Vit D-Min (CALCIUM 600+D PLUS MINERALS) 600-400 MG-UNIT TABS Take by mouth.   . cetirizine (ZYRTEC) 10 MG tablet Take 10 mg by mouth daily.   . Cholecalciferol (VITAMIN D-3) 1000 UNITS CAPS Take 1 capsule by mouth daily.   Marland Kitchen dicyclomine (BENTYL) 20 MG tablet Take 1 tablet (20 mg total) by mouth 4 (four) times daily -  before meals and at  bedtime for 30 days. 01/04/2020: PRN stomach spasms  . diphenhydrAMINE (BENADRYL) 25 mg capsule Take by mouth.   . Fluticasone-Salmeterol (ADVAIR DISKUS) 250-50 MCG/DOSE AEPB Inhale 1 puff into the lungs 2 (two) times daily.   . insulin degludec (TRESIBA FLEXTOUCH) 100 UNIT/ML SOPN FlexTouch Pen Inject 0.2 mLs (20 Units total) into the skin daily.   . Insulin Pen Needle (BD PEN NEEDLE NANO U/F) 32G X 4 MM MISC Use to inject insulin daily   . ipratropium (ATROVENT HFA) 17 MCG/ACT inhaler Inhale 2 puffs into the lungs every 6 (six) hours. 01/04/2020: Using PRN  . loperamide (IMODIUM) 2 MG capsule Take by mouth. 01/04/2020: Taking 1 QAM  . losartan (COZAAR) 100 MG tablet TAKE 1 TABLET BY MOUTH EVERY DAY   . omeprazole (PRILOSEC) 20 MG capsule Take 1 capsule (20 mg total) by mouth 2 (two) times daily.   . ONE TOUCH ULTRA TEST test strip PATIENT NEEDS NEW METER STRIPS AND LANCETS FOR ONE TOUCH. HER INSURANCE NO LONGER COVERS ACCUCHEK.   Marland Kitchen potassium chloride (KLOR-CON) 10 MEQ tablet TAKE 1 TABLET(10 MEQ) BY MOUTH TWICE DAILY   . rosuvastatin (CRESTOR) 10 MG tablet Take 1 tablet (10 mg total) by mouth daily.   Marland Kitchen triamterene-hydrochlorothiazide (MAXZIDE-25) 37.5-25 MG tablet Take 0.5 tablets by mouth daily.    No facility-administered encounter medications on file as of 03/21/2020.     Objective:   Goals Addressed            This Visit's Progress     Patient Stated   . "I want to work on  sugars" (pt-stated)       CARE PLAN ENTRY (see longtitudinal plan of care for additional care plan information)  Current Barriers:  . Diabetes: uncontrolled; complicated by chronic medical conditions including CKD, HTN, asthma, most recent A1c 7.4% o Reports she feels like she has lost weight (tried on a pair of pants yesterday that she couldn't fit into last year) . Most recent eGFR: 56 mL/min . Current antihyperglycemic regimen: Tresiba 20 units daily, Ozempic 0.25 mg weekly (x3 doses) o APPROVED for Tresiba  assistance through 10/01/20 o Hx Jardiance, recurrent UTIs . Current glucose readings:  o Fastings: ranging from 110-160s . Cardiovascular risk reduction: o Current hypertensive regimen: amlodipine 5 mg BID, losartan 100 mg QPM, triamterene/HCTZ 37.5/25 1/2 tab QAM; BP at goal on last visit  o Current hyperlipidemia regimen: rosuvastatin 10 mg daily; last LDL at goal <70 o Current antiplatelet regimen: none . Asthma/congestion: cetirizine 10 mg daily; Advair 250/50 mcg BID, PRN use of albuterol in nebulizer and Atrovent inhaler. Has not needed either of these PRN medications in a few weeks . Potassium 10 mEq BID. Last K level 4.0 mEq  Pharmacist Clinical Goal(s):  Marland Kitchen Over the next 90 days, patient will work with PharmD and primary care provider to address optimized medication management  Interventions: . Comprehensive medication review performed, medication list updated in electronic medical record . Inter-disciplinary care team collaboration (see longitudinal plan of care) . Continue Ozempic as prescribed. Continue Tresiba 20 units at this time.  . Printed provider portion for [employer] through [employer]. Will fax in to add Ozempic to her already prescribed [employer] approval for [name].   Patient Self Care Activities:  . Patient will check blood glucose BID, document, and provide at future appointments . Patient will take medications as prescribed . Patient will report any questions or concerns to provider   Please see past updates related to this goal by clicking on the "Past Updates" button in the selected goal          Plan:  - Scheduled f/u call in ~ 5 weeks  [name] Darnelle Maffucci, PharmD, [place], [place] Pharmacist [place] [phone]

## 2020-03-22 ENCOUNTER — Other Ambulatory Visit: Payer: PPO

## 2020-03-22 NOTE — Progress Notes (Signed)
Reviewed information.  Agree with plan.    Dr Salif Tay 

## 2020-03-28 ENCOUNTER — Ambulatory Visit: Payer: Self-pay | Admitting: Pharmacist

## 2020-03-28 DIAGNOSIS — Z794 Long term (current) use of insulin: Secondary | ICD-10-CM

## 2020-03-28 NOTE — Progress Notes (Signed)
Reviewed information.  Agree with plan.    Dr Kerri Asche 

## 2020-03-28 NOTE — Patient Instructions (Signed)
Visit Information  Goals Addressed            This Visit's Progress     Patient Stated   . "I want to work on sugars" (pt-stated)       CARE PLAN ENTRY (see longtitudinal plan of care for additional care plan information)  Current Barriers:  . Diabetes: uncontrolled; complicated by chronic medical conditions including CKD, HTN, asthma, most recent A1c 7.4% . Most recent eGFR: 56 mL/min . Current antihyperglycemic regimen: Tresiba 20 units daily, Ozempic 0.25 mg weekly o APPROVED for Tresiba assistance through 10/01/20 o Hx Jardiance, recurrent UTIs . Current glucose readings:  o Fastings: ranging from 110-160s . Cardiovascular risk reduction: o Current hypertensive regimen: amlodipine 5 mg BID, losartan 100 mg QPM, triamterene/HCTZ 37.5/25 1/2 tab QAM; BP at goal on last visit  o Current hyperlipidemia regimen: rosuvastatin 10 mg daily; last LDL at goal <70 o Current antiplatelet regimen: none . Asthma/congestion: cetirizine 10 mg daily; Advair 250/50 mcg BID, PRN use of albuterol in nebulizer and Atrovent inhaler. Has not needed either of these PRN medications in a few weeks . Potassium 10 mEq BID. Last K level 4.0 mEq  Pharmacist Clinical Goal(s):  Marland Kitchen Over the next 90 days, patient will work with PharmD and primary care provider to address optimized medication management  Interventions: . Faxed Ozempic prescription into [employer]. Will f/u with company in ~5-7 business days  Patient Self Care Activities:  . Patient will check blood glucose BID, document, and provide at future appointments . Patient will take medications as prescribed . Patient will report any questions or concerns to provider   Please see past updates related to this goal by clicking on the "Past Updates" button in the selected goal         Patient verbalizes understanding of instructions provided today.   Plan:  - Will outreach as previously scheduled.  [name] Darnelle Maffucci, PharmD, [place],  CPP Clinical Pharmacist [place] [phone]

## 2020-03-28 NOTE — Chronic Care Management (AMB) (Signed)
Chronic Care Management   Follow Up Note   03/28/2020 Name: Ashley Gross MRN: [id number] DOB: 07-Jan-1951  Referred by: Einar Pheasant, MD Reason for referral : Chronic Care Management (Medication Management)   Ashley Gross is a 69 y.o. year old female who is a primary care patient of Einar Pheasant, MD. The CCM team was consulted for assistance with chronic disease management and care coordination needs.    Care coordination completed today.   Review of patient status, including review of consultants reports, relevant laboratory and other test results, and collaboration with appropriate care team members and the patient's provider was performed as part of comprehensive patient evaluation and provision of chronic care management services.    SDOH (Social Determinants of Health) assessments performed: No See Care Plan activities for detailed interventions related to [facility])     Outpatient Encounter Medications as of 03/28/2020  Medication Sig Note  . albuterol (ACCUNEB) 0.63 MG/3ML nebulizer solution Take 3 mLs (0.63 mg total) by nebulization every 6 (six) hours as needed for wheezing. (Patient not taking: Reported on 01/04/2020) 12/21/2018: PRN, last used several months ago  . amLODipine (NORVASC) 5 MG tablet TAKE 1 TABLET(5 MG) BY MOUTH TWICE DAILY   . budesonide-formoterol (SYMBICORT) 160-4.5 MCG/ACT inhaler Inhale 2 puffs into the lungs 2 (two) times daily.   . Calcium Carbonate-Vit D-Min (CALCIUM 600+D PLUS MINERALS) 600-400 MG-UNIT TABS Take by mouth.   . cetirizine (ZYRTEC) 10 MG tablet Take 10 mg by mouth daily.   . Cholecalciferol (VITAMIN D-3) 1000 UNITS CAPS Take 1 capsule by mouth daily.   Marland Kitchen dicyclomine (BENTYL) 20 MG tablet Take 1 tablet (20 mg total) by mouth 4 (four) times daily -  before meals and at bedtime for 30 days. 01/04/2020: PRN stomach spasms  . diphenhydrAMINE (BENADRYL) 25 mg capsule Take by mouth.   . Fluticasone-Salmeterol (ADVAIR DISKUS) 250-50 MCG/DOSE  AEPB Inhale 1 puff into the lungs 2 (two) times daily.   . insulin degludec (TRESIBA FLEXTOUCH) 100 UNIT/ML SOPN FlexTouch Pen Inject 0.2 mLs (20 Units total) into the skin daily.   . Insulin Pen Needle (BD PEN NEEDLE NANO U/F) 32G X 4 MM MISC Use to inject insulin daily   . ipratropium (ATROVENT HFA) 17 MCG/ACT inhaler Inhale 2 puffs into the lungs every 6 (six) hours. 01/04/2020: Using PRN  . loperamide (IMODIUM) 2 MG capsule Take by mouth. 01/04/2020: Taking 1 QAM  . losartan (COZAAR) 100 MG tablet TAKE 1 TABLET BY MOUTH EVERY DAY   . omeprazole (PRILOSEC) 20 MG capsule Take 1 capsule (20 mg total) by mouth 2 (two) times daily.   . ONE TOUCH ULTRA TEST test strip PATIENT NEEDS NEW METER STRIPS AND LANCETS FOR ONE TOUCH. HER INSURANCE NO LONGER COVERS ACCUCHEK.   Marland Kitchen potassium chloride (KLOR-CON) 10 MEQ tablet TAKE 1 TABLET(10 MEQ) BY MOUTH TWICE DAILY   . rosuvastatin (CRESTOR) 10 MG tablet Take 1 tablet (10 mg total) by mouth daily.   . Semaglutide,0.25 or 0.5MG/DOS, (OZEMPIC, 0.25 OR 0.5 MG/DOSE,) 2 MG/1.5ML SOPN Inject 0.25 mg once weekly for 4 weeks then increase to 0.5 mg weekly   . triamterene-hydrochlorothiazide (MAXZIDE-25) 37.5-25 MG tablet Take 0.5 tablets by mouth daily.    No facility-administered encounter medications on file as of 03/28/2020.     Objective:   Goals Addressed            This Visit's Progress     Patient Stated   . "I want to work on sugars" (pt-stated)  CARE PLAN ENTRY (see longtitudinal plan of care for additional care plan information)  Current Barriers:  . Diabetes: uncontrolled; complicated by chronic medical conditions including CKD, HTN, asthma, most recent A1c 7.4% . Most recent eGFR: 56 mL/min . Current antihyperglycemic regimen: Tresiba 20 units daily, Ozempic 0.25 mg weekly o APPROVED for Tresiba assistance through 10/01/20 o Hx Jardiance, recurrent UTIs . Current glucose readings:  o Fastings: ranging from 110-160s . Cardiovascular risk  reduction: o Current hypertensive regimen: amlodipine 5 mg BID, losartan 100 mg QPM, triamterene/HCTZ 37.5/25 1/2 tab QAM; BP at goal on last visit  o Current hyperlipidemia regimen: rosuvastatin 10 mg daily; last LDL at goal <70 o Current antiplatelet regimen: none . Asthma/congestion: cetirizine 10 mg daily; Advair 250/50 mcg BID, PRN use of albuterol in nebulizer and Atrovent inhaler. Has not needed either of these PRN medications in a few weeks . Potassium 10 mEq BID. Last K level 4.0 mEq  Pharmacist Clinical Goal(s):  Marland Kitchen Over the next 90 days, patient will work with PharmD and primary care provider to address optimized medication management  Interventions: . Faxed Ozempic prescription into [employer]. Will f/u with company in ~5-7 business days  Patient Self Care Activities:  . Patient will check blood glucose BID, document, and provide at future appointments . Patient will take medications as prescribed . Patient will report any questions or concerns to provider   Please see past updates related to this goal by clicking on the "Past Updates" button in the selected goal          Plan:  - Will outreach as previously scheduled.  [name] Darnelle Maffucci, PharmD, [place], CPP Clinical Pharmacist [place] [phone]

## 2020-03-29 ENCOUNTER — Other Ambulatory Visit (INDEPENDENT_AMBULATORY_CARE_PROVIDER_SITE_OTHER): Payer: PPO

## 2020-03-29 ENCOUNTER — Other Ambulatory Visit: Payer: Self-pay

## 2020-03-29 DIAGNOSIS — E118 Type 2 diabetes mellitus with unspecified complications: Secondary | ICD-10-CM

## 2020-03-29 DIAGNOSIS — E7800 Pure hypercholesterolemia, unspecified: Secondary | ICD-10-CM

## 2020-03-29 DIAGNOSIS — E559 Vitamin D deficiency, unspecified: Secondary | ICD-10-CM

## 2020-03-29 DIAGNOSIS — Z794 Long term (current) use of insulin: Secondary | ICD-10-CM | POA: Diagnosis not present

## 2020-03-29 DIAGNOSIS — I10 Essential (primary) hypertension: Secondary | ICD-10-CM | POA: Diagnosis not present

## 2020-03-29 LAB — LIPID PANEL
Cholesterol: 130 mg/dL (ref 0–200)
HDL: 49.3 mg/dL (ref >39.00)
LDL Cholesterol: 55 mg/dL (ref 0–99)
NonHDL: 80.26
Total CHOL/HDL Ratio: 3
Triglycerides: 125 mg/dL (ref 0.0–149.0)
VLDL: 25 mg/dL (ref 0.0–40.0)

## 2020-03-29 LAB — HEPATIC FUNCTION PANEL
ALT: 12 U/L (ref 0–35)
AST: 18 U/L (ref 0–37)
Albumin: 4 g/dL (ref 3.5–5.2)
Alkaline Phosphatase: 88 U/L (ref 39–117)
Bilirubin, Direct: 0.1 mg/dL (ref 0.0–0.3)
Total Bilirubin: 0.4 mg/dL (ref 0.2–1.2)
Total Protein: 6.5 g/dL (ref 6.0–8.3)

## 2020-03-29 LAB — CBC WITH DIFFERENTIAL/PLATELET
Basophils Absolute: 0.1 10*3/uL (ref 0.0–0.1)
Basophils Relative: 1 % (ref 0.0–3.0)
Eosinophils Absolute: 0.1 10*3/uL (ref 0.0–0.7)
Eosinophils Relative: 2 % (ref 0.0–5.0)
HCT: 37.8 % (ref 36.0–46.0)
Hemoglobin: 12.3 g/dL (ref 12.0–15.0)
Lymphocytes Relative: 32.9 % (ref 12.0–46.0)
Lymphs Abs: 1.9 10*3/uL (ref 0.7–4.0)
MCHC: 32.5 g/dL (ref 30.0–36.0)
MCV: 90.3 fl (ref 78.0–100.0)
Monocytes Absolute: 0.4 10*3/uL (ref 0.1–1.0)
Monocytes Relative: 7.4 % (ref 3.0–12.0)
Neutro Abs: 3.2 10*3/uL (ref 1.4–7.7)
Neutrophils Relative %: 56.7 % (ref 43.0–77.0)
Platelets: 241 10*3/uL (ref 150.0–400.0)
RBC: 4.19 Mil/uL (ref 3.87–5.11)
RDW: 13.8 % (ref 11.5–15.5)
WBC: 5.7 10*3/uL (ref 4.0–10.5)

## 2020-03-29 LAB — BASIC METABOLIC PANEL
BUN: 18 mg/dL (ref 6–23)
CO2: 29 mEq/L (ref 19–32)
Calcium: 9 mg/dL (ref 8.4–10.5)
Chloride: 104 mEq/L (ref 96–112)
Creatinine, Ser: 1.06 mg/dL (ref 0.40–1.20)
GFR: 51.37 mL/min — ABNORMAL LOW (ref >60.00)
Glucose, Bld: 104 mg/dL — ABNORMAL HIGH (ref 70–99)
Potassium: 4.1 mEq/L (ref 3.5–5.1)
Sodium: 140 mEq/L (ref 135–145)

## 2020-03-29 LAB — HEMOGLOBIN A1C: Hgb A1c MFr Bld: 7.8 % — ABNORMAL HIGH (ref 4.6–6.5)

## 2020-03-29 LAB — VITAMIN D 25 HYDROXY (VIT D DEFICIENCY, FRACTURES): VITD: 24.7 ng/mL — ABNORMAL LOW (ref 30.00–100.00)

## 2020-04-04 ENCOUNTER — Other Ambulatory Visit: Payer: Self-pay

## 2020-04-04 DIAGNOSIS — E1121 Type 2 diabetes mellitus with diabetic nephropathy: Secondary | ICD-10-CM

## 2020-04-04 MED ORDER — OZEMPIC (0.25 OR 0.5 MG/DOSE) 2 MG/1.5ML ~~LOC~~ SOPN: PEN_INJECTOR | SUBCUTANEOUS | 2 refills | Status: DC

## 2020-04-04 NOTE — Addendum Note (Signed)
Addended by: Ezequiel Ganser on: 04/04/2020 09:41 AM   Modules accepted: Orders

## 2020-04-09 ENCOUNTER — Telehealth: Payer: Self-pay | Admitting: Pharmacist

## 2020-04-09 ENCOUNTER — Ambulatory Visit: Payer: Self-pay | Admitting: Pharmacist

## 2020-04-09 DIAGNOSIS — E1121 Type 2 diabetes mellitus with diabetic nephropathy: Secondary | ICD-10-CM

## 2020-04-09 MED ORDER — OZEMPIC (0.25 OR 0.5 MG/DOSE) 2 MG/1.5ML ~~LOC~~ SOPN: PEN_INJECTOR | SUBCUTANEOUS | 0 refills | Status: DC

## 2020-04-09 NOTE — Patient Instructions (Signed)
Visit Information  Goals Addressed            This Visit's Progress     Patient Stated   . "I want to work on sugars" (pt-stated)       CARE PLAN ENTRY (see longtitudinal plan of care for additional care plan information)  Current Barriers:  . Diabetes: uncontrolled; complicated by chronic medical conditions including CKD, HTN, asthma, most recent A1c 7.4% o Received call from patient to follow up on addition of Ozempic to her approval for [employer] assistance . Most recent eGFR: 56 mL/min . Current antihyperglycemic regimen: Tresiba 20 units daily, Ozempic 0.5 mg weekly o APPROVED for Tresiba assistance through 10/01/20 o Hx Jardiance, recurrent UTIs . Cardiovascular risk reduction: o Current hypertensive regimen: amlodipine 5 mg BID, losartan 100 mg QPM, triamterene/HCTZ 37.5/25 1/2 tab QAM; BP at goal on last visit  o Current hyperlipidemia regimen: rosuvastatin 10 mg daily; last LDL at goal <70 o Current antiplatelet regimen: none . Asthma/congestion: cetirizine 10 mg daily; Advair 250/50 mcg BID, PRN use of albuterol in nebulizer and Atrovent inhaler . Potassium 10 mEq BID. Last K level 4.0 mEq  Pharmacist Clinical Goal(s):  Marland Kitchen Over the next 90 days, patient will work with PharmD and primary care provider to address optimized medication management  Interventions: . [employer]. They show the Ozempic order placed on 5/31. They note it can take 10-14 business days for the medication to arrive to the office.  Marland Kitchen Contacted patient, informed of the above. Will collaborate w/ office staff to pull sample of Ozempic to tide patient over until medication arrives  Patient Self Care Activities:  . Patient will check blood glucose BID, document, and provide at future appointments . Patient will take medications as prescribed . Patient will report any questions or concerns to provider   Please see past updates related to this goal by clicking on the "Past Updates" button  in the selected goal         Patient verbalizes understanding of instructions provided today.   Plan:  - Will outreach as previously scheduled  [name] Darnelle Maffucci, PharmD, [place], [place] Pharmacist [place] [phone]

## 2020-04-09 NOTE — Progress Notes (Signed)
I have reviewed the above note and agree. I was available to the pharmacist for consultation.  Michael Walrath, MD 

## 2020-04-09 NOTE — Telephone Encounter (Signed)
Patient awaiting delivery of Ozempic from [employer] assistance, however, will likely run out of medication before it arrives.   Can we please prepare 1 sample Ozempic pen for patient?

## 2020-04-09 NOTE — Chronic Care Management (AMB) (Signed)
Chronic Care Management   Follow Up Note   04/09/2020 Name: Ashley Gross MRN: [id number] DOB: October 12, 1951  Referred by: Einar Pheasant, MD Reason for referral : Chronic Care Management (Medication Management)   Ashley Gross is a 69 y.o. year old female who is a primary care patient of Einar Pheasant, MD. The CCM team was consulted for assistance with chronic disease management and care coordination needs.    Care coordination completed today.   Review of patient status, including review of consultants reports, relevant laboratory and other test results, and collaboration with appropriate care team members and the patient's provider was performed as part of comprehensive patient evaluation and provision of chronic care management services.    SDOH (Social Determinants of Health) assessments performed: Yes See Care Plan activities for detailed interventions related to [facility])     Outpatient Encounter Medications as of 04/09/2020  Medication Sig Note   albuterol (ACCUNEB) 0.63 MG/3ML nebulizer solution Take 3 mLs (0.63 mg total) by nebulization every 6 (six) hours as needed for wheezing. (Patient not taking: Reported on 01/04/2020) 12/21/2018: PRN, last used several months ago   amLODipine (NORVASC) 5 MG tablet TAKE 1 TABLET(5 MG) BY MOUTH TWICE DAILY    budesonide-formoterol (SYMBICORT) 160-4.5 MCG/ACT inhaler Inhale 2 puffs into the lungs 2 (two) times daily.    Calcium Carbonate-Vit D-Min (CALCIUM 600+D PLUS MINERALS) 600-400 MG-UNIT TABS Take by mouth.    cetirizine (ZYRTEC) 10 MG tablet Take 10 mg by mouth daily.    Cholecalciferol (VITAMIN D-3) 1000 UNITS CAPS Take 1 capsule by mouth daily.    dicyclomine (BENTYL) 20 MG tablet Take 1 tablet (20 mg total) by mouth 4 (four) times daily -  before meals and at bedtime for 30 days. 01/04/2020: PRN stomach spasms   diphenhydrAMINE (BENADRYL) 25 mg capsule Take by mouth.    Fluticasone-Salmeterol (ADVAIR DISKUS) 250-50 MCG/DOSE  AEPB Inhale 1 puff into the lungs 2 (two) times daily.    insulin degludec (TRESIBA FLEXTOUCH) 100 UNIT/ML SOPN FlexTouch Pen Inject 0.2 mLs (20 Units total) into the skin daily.    Insulin Pen Needle (BD PEN NEEDLE NANO U/F) 32G X 4 MM MISC Use to inject insulin daily    ipratropium (ATROVENT HFA) 17 MCG/ACT inhaler Inhale 2 puffs into the lungs every 6 (six) hours. 01/04/2020: Using PRN   loperamide (IMODIUM) 2 MG capsule Take by mouth. 01/04/2020: Taking 1 QAM   losartan (COZAAR) 100 MG tablet TAKE 1 TABLET BY MOUTH EVERY DAY    omeprazole (PRILOSEC) 20 MG capsule Take 1 capsule (20 mg total) by mouth 2 (two) times daily.    ONE TOUCH ULTRA TEST test strip PATIENT NEEDS NEW METER STRIPS AND LANCETS FOR ONE TOUCH. HER INSURANCE NO LONGER COVERS ACCUCHEK.    potassium chloride (KLOR-CON) 10 MEQ tablet TAKE 1 TABLET(10 MEQ) BY MOUTH TWICE DAILY    rosuvastatin (CRESTOR) 10 MG tablet Take 1 tablet (10 mg total) by mouth daily.    Semaglutide,0.25 or 0.5MG/DOS, (OZEMPIC, 0.25 OR 0.5 MG/DOSE,) 2 MG/1.5ML SOPN Inject 0.5 mg weekly    triamterene-hydrochlorothiazide (MAXZIDE-25) 37.5-25 MG tablet Take 0.5 tablets by mouth daily.    [DISCONTINUED] Semaglutide,0.25 or 0.5MG/DOS, (OZEMPIC, 0.25 OR 0.5 MG/DOSE,) 2 MG/1.5ML SOPN Inject 0.25 mg once weekly for 4 weeks then increase to 0.5 mg weekly    No facility-administered encounter medications on file as of 04/09/2020.     Objective:   Goals Addressed  This Visit's Progress     Patient Stated    "I want to work on sugars" (pt-stated)       CARE PLAN ENTRY (see longtitudinal plan of care for additional care plan information)  Current Barriers:   Diabetes: uncontrolled; complicated by chronic medical conditions including CKD, HTN, asthma, most recent A1c 7.4% o Received call from patient to follow up on addition of Ozempic to her approval for [employer] assistance  Most recent eGFR: 56 mL/min  Current antihyperglycemic  regimen: Tresiba 20 units daily, Ozempic 0.5 mg weekly o APPROVED for [name] assistance through 10/01/20 o Hx Jardiance, recurrent UTIs  Cardiovascular risk reduction: o Current hypertensive regimen: amlodipine 5 mg BID, losartan 100 mg QPM, triamterene/HCTZ 37.5/25 1/2 tab QAM; BP at goal on last visit  o Current hyperlipidemia regimen: rosuvastatin 10 mg daily; last LDL at goal <70 o Current antiplatelet regimen: none  Asthma/congestion: cetirizine 10 mg daily; Advair 250/50 mcg BID, PRN use of albuterol in nebulizer and Atrovent inhaler  Potassium 10 mEq BID. Last K level 4.0 mEq  Pharmacist Clinical Goal(s):   Over the next 90 days, patient will work with PharmD and primary care provider to address optimized medication management  Interventions:  [employer]. They show the Ozempic order placed on 5/31. They note it can take 10-14 business days for the medication to arrive to the office.   Contacted patient, informed of the above. Will collaborate w/ office staff to pull sample of Ozempic to tide patient over until medication arrives  Patient Self Care Activities:   Patient will check blood glucose BID, document, and provide at future appointments  Patient will take medications as prescribed  Patient will report any questions or concerns to provider   Please see past updates related to this goal by clicking on the "Past Updates" button in the selected goal          Plan:  - Will outreach as previously scheduled  [name] Darnelle Maffucci, PharmD, [place], [place] Pharmacist [place] [phone]

## 2020-04-10 NOTE — Telephone Encounter (Addendum)
Medication Samples have been provided to the patient.  Drug name: Ozempic   Strength: 2 Mg/1.5 mL       Qty: 1  LOT: [id number]  Exp.Date:03/2022  Dosing instructions: Sig: Inject 0.5 mg weekly  The patient has been instructed regarding the correct time, dose, and frequency of taking this medication, including desired effects and most common side effects.   Kerin Salen 4:08 PM 04/10/2020

## 2020-04-10 NOTE — Telephone Encounter (Addendum)
Yes, we can give one pen ,notified patient and she will pick up today.

## 2020-04-11 NOTE — Telephone Encounter (Signed)
Patient has picked up ozempic 

## 2020-04-16 ENCOUNTER — Telehealth: Payer: Self-pay | Admitting: Internal Medicine

## 2020-04-16 MED ORDER — OMEPRAZOLE 20 MG PO CPDR: 20.0000 mg | DELAYED_RELEASE_CAPSULE | Freq: Two times a day (BID) | ORAL | 1 refills | Status: DC

## 2020-04-16 NOTE — Telephone Encounter (Signed)
Patient needs the following refill; omeprazole (PRILOSEC) 20 MG capsule. Patient is out of her medication.

## 2020-04-17 ENCOUNTER — Ambulatory Visit (INDEPENDENT_AMBULATORY_CARE_PROVIDER_SITE_OTHER): Payer: PPO

## 2020-04-17 ENCOUNTER — Other Ambulatory Visit: Payer: Self-pay

## 2020-04-17 ENCOUNTER — Telehealth: Payer: Self-pay | Admitting: Internal Medicine

## 2020-04-17 DIAGNOSIS — E538 Deficiency of other specified B group vitamins: Secondary | ICD-10-CM | POA: Diagnosis not present

## 2020-04-17 MED ORDER — CYANOCOBALAMIN 1000 MCG/ML IJ SOLN
1000.0000 ug | Freq: Once | INTRAMUSCULAR | Status: AC
  Administered 2020-04-17: 1000 ug via INTRAMUSCULAR

## 2020-04-17 NOTE — Telephone Encounter (Signed)
Blood sugars placed in results folder to review when you return

## 2020-04-17 NOTE — Telephone Encounter (Signed)
Pt dropped off blood sugar results. Placed in folder up front

## 2020-04-17 NOTE — Progress Notes (Signed)
Patient presented for B 12 injection to left deltoid, patient voiced no concerns nor showed any signs of distress during injection. 

## 2020-04-18 NOTE — Telephone Encounter (Signed)
Sugar range in AM- 107-165. PM range: 116-185. One reading of 214. Readings started being recorded on 03/08/20.

## 2020-04-18 NOTE — Telephone Encounter (Signed)
Since I am not in the office, can you review her sugars and give me an am and pm range  or average.

## 2020-04-18 NOTE — Telephone Encounter (Signed)
Notify pt - continue to check sugars bid.  Sugars are a little higher than what I would like to see. Continue low carb diet and exercise.  Continue to spot check sugars.  Per review of appts, she has a f/u coming up soon with [name].  May need to increase ozempic.

## 2020-04-19 NOTE — Telephone Encounter (Signed)
Pt returned your call and said she is going out so if you don't get her on home number call her cell and it is ok to leave a detailed message.

## 2020-04-19 NOTE — Telephone Encounter (Signed)
LMTCB

## 2020-04-19 NOTE — Telephone Encounter (Signed)
Left detailed message for patient.

## 2020-04-22 DIAGNOSIS — M778 Other enthesopathies, not elsewhere classified: Secondary | ICD-10-CM | POA: Diagnosis not present

## 2020-04-22 DIAGNOSIS — B351 Tinea unguium: Secondary | ICD-10-CM | POA: Diagnosis not present

## 2020-04-22 DIAGNOSIS — M79675 Pain in left toe(s): Secondary | ICD-10-CM | POA: Diagnosis not present

## 2020-04-22 DIAGNOSIS — M79674 Pain in right toe(s): Secondary | ICD-10-CM | POA: Diagnosis not present

## 2020-04-22 DIAGNOSIS — Q6689 Other  specified congenital deformities of feet: Secondary | ICD-10-CM | POA: Diagnosis not present

## 2020-04-22 DIAGNOSIS — L600 Ingrowing nail: Secondary | ICD-10-CM | POA: Diagnosis not present

## 2020-04-22 LAB — HM DIABETES FOOT EXAM

## 2020-04-25 ENCOUNTER — Ambulatory Visit (INDEPENDENT_AMBULATORY_CARE_PROVIDER_SITE_OTHER): Payer: PPO | Admitting: Pharmacist

## 2020-04-25 DIAGNOSIS — E1121 Type 2 diabetes mellitus with diabetic nephropathy: Secondary | ICD-10-CM | POA: Diagnosis not present

## 2020-04-25 MED ORDER — TRESIBA FLEXTOUCH 100 UNIT/ML ~~LOC~~ SOPN: 22.0000 [IU] | PEN_INJECTOR | Freq: Every day | SUBCUTANEOUS | 2 refills | Status: DC

## 2020-04-25 NOTE — Progress Notes (Signed)
I have reviewed the above note and agree. I was available to the pharmacist for consultation.  Zaydan Papesh, MD 

## 2020-04-25 NOTE — Patient Instructions (Addendum)
Ms. Chaudhari,   It was great talking with you today!  Remember, our goal is an A1c <7%. This is going to correspond to fasting sugars always <130 and 2 hour post meal sugars <180.   Keep taking Ozempic 0.5 mg weekly. We could increase this to 1 mg weekly, but I don't want to decrease your appetite any more than it is (I don't want you to feel sick!)  Increase Tresiba to 22 units daily. If, after about a week, you still have many of your fasting readings >130, you can increase to 24 units of Tresiba.   Call with any questions! Let us know when you need [place] to submit the refill paperwork for [name] or Novolog to [employer].  Take care,  [name] Darnelle Maffucci, PharmD [phone]  Visit Information  Goals Addressed              This Visit's Progress     Patient Stated   .  "I want to work on sugars" (pt-stated)        CARE PLAN ENTRY (see longtitudinal plan of care for additional care plan information)  Current Barriers:  . Diabetes: uncontrolled; complicated by chronic medical conditions including CKD, HTN, asthma, most recent A1c 7.8% o Reports that she has a very decreased appetite and fills up quickly. Unable to eat both a side and a meal, only able to eat one or the other. Worries about appetite decreasing any further . Most recent eGFR: 56 mL/min . Current antihyperglycemic regimen: Tresiba 20 units daily, Ozempic 0.5 mg weekly o APPROVED for Ozempic and Tresiba assistance through 10/01/20 o Hx Jardiance, recurrent UTIs . Denies any episodes of hypoglycemia, no readings <70 . Current glucose readings:   Fasting Before Lunch Before Supper Bedtime  10-Jun 124     11-Jun 133  119   12-Jun 137   126  13-Jun 122     14-Jun  124    15-Jun 124     16-Jun 151  109   17-Jun 145     18-Jun    154  19-Jun 114     20-Jun 127     21-Jun    124  22-Jun 116     23-Jun 134   172  24-Jun 115     AVG 129 124 114 144   . Cardiovascular risk reduction: o Current hypertensive regimen:  amlodipine 5 mg BID, losartan 100 mg QPM, triamterene/HCTZ 37.5/25 1/2 tab QAM; BP at goal on last visit  o Current hyperlipidemia regimen: rosuvastatin 10 mg daily; last LDL at goal <70 o Current antiplatelet regimen: none . Asthma/congestion: cetirizine 10 mg daily; Advair 250/50 mcg BID, PRN use of albuterol in nebulizer and Atrovent inhaler . Potassium 10 mEq BID. Last K level 4.0 mEq  Pharmacist Clinical Goal(s):  Marland Kitchen Over the next 90 days, patient will work with PharmD and primary care provider to address optimized medication management  Interventions: . Comprehensive medication review performed, medication list updated in electronic medical record . Inter-disciplinary care team collaboration (see longitudinal plan of care) . Discussed goal A1c, goal fasting, and goal 2 hour post prandial readings . Discussed maximizing Ozempic, however, patient uncomfortable with any further reduction in appetite. Will continue Ozempic 0.5 mg weekly . Increase Tresiba to 22 units daily. Patient counseled that if after 1 week, she still has many fastings >130, she can increase to 24 units daily    Patient Self Care Activities:  . Patient will check blood glucose BID, document,  and provide at future appointments . Patient will take medications as prescribed . Patient will report any questions or concerns to provider   Please see past updates related to this goal by clicking on the "Past Updates" button in the selected goal         The patient verbalized understanding of instructions provided today and agreed to receive a mailed copy of patient instruction and/or educational materials.  Plan:  - Scheduled f/u call in ~ 12 weeks  [name] Darnelle Maffucci, PharmD, [place], [place] Pharmacist [place] [phone]

## 2020-04-25 NOTE — Chronic Care Management (AMB) (Signed)
Chronic Care Management   Follow Up Note   04/25/2020 Name: Ashley Gross MRN: [id number] DOB: 14-Jan-1951  Referred by: Einar Pheasant, MD Reason for referral : Chronic Care Management (Medication Management)   Ashley Gross is a 69 y.o. year old female who is a primary care patient of Einar Pheasant, MD. The CCM team was consulted for assistance with chronic disease management and care coordination needs.    Contacted patient for medication management review.   Review of patient status, including review of consultants reports, relevant laboratory and other test results, and collaboration with appropriate care team members and the patient's provider was performed as part of comprehensive patient evaluation and provision of chronic care management services.    SDOH (Social Determinants of Health) assessments performed: No See Care Plan activities for detailed interventions related to [facility])     Outpatient Encounter Medications as of 04/25/2020  Medication Sig Note  . insulin degludec (TRESIBA FLEXTOUCH) 100 UNIT/ML FlexTouch Pen Inject 0.22 mLs (22 Units total) into the skin daily.   . Semaglutide,0.25 or 0.5MG/DOS, (OZEMPIC, 0.25 OR 0.5 MG/DOSE,) 2 MG/1.5ML SOPN Inject 0.5 mg weekly   . [DISCONTINUED] insulin degludec (TRESIBA FLEXTOUCH) 100 UNIT/ML SOPN FlexTouch Pen Inject 0.2 mLs (20 Units total) into the skin daily.   Marland Kitchen albuterol (ACCUNEB) 0.63 MG/3ML nebulizer solution Take 3 mLs (0.63 mg total) by nebulization every 6 (six) hours as needed for wheezing. (Patient not taking: Reported on 01/04/2020) 12/21/2018: PRN, last used several months ago  . amLODipine (NORVASC) 5 MG tablet TAKE 1 TABLET(5 MG) BY MOUTH TWICE DAILY   . budesonide-formoterol (SYMBICORT) 160-4.5 MCG/ACT inhaler Inhale 2 puffs into the lungs 2 (two) times daily.   . Calcium Carbonate-Vit D-Min (CALCIUM 600+D PLUS MINERALS) 600-400 MG-UNIT TABS Take by mouth.   . cetirizine (ZYRTEC) 10 MG tablet Take 10 mg by  mouth daily.   . Cholecalciferol (VITAMIN D-3) 1000 UNITS CAPS Take 1 capsule by mouth daily.   Marland Kitchen dicyclomine (BENTYL) 20 MG tablet Take 1 tablet (20 mg total) by mouth 4 (four) times daily -  before meals and at bedtime for 30 days. 01/04/2020: PRN stomach spasms  . diphenhydrAMINE (BENADRYL) 25 mg capsule Take by mouth.   . Fluticasone-Salmeterol (ADVAIR DISKUS) 250-50 MCG/DOSE AEPB Inhale 1 puff into the lungs 2 (two) times daily.   . Insulin Pen Needle (BD PEN NEEDLE NANO U/F) 32G X 4 MM MISC Use to inject insulin daily   . ipratropium (ATROVENT HFA) 17 MCG/ACT inhaler Inhale 2 puffs into the lungs every 6 (six) hours. 01/04/2020: Using PRN  . loperamide (IMODIUM) 2 MG capsule Take by mouth. 01/04/2020: Taking 1 QAM  . losartan (COZAAR) 100 MG tablet TAKE 1 TABLET BY MOUTH EVERY DAY   . omeprazole (PRILOSEC) 20 MG capsule Take 1 capsule (20 mg total) by mouth 2 (two) times daily.   . ONE TOUCH ULTRA TEST test strip PATIENT NEEDS NEW METER STRIPS AND LANCETS FOR ONE TOUCH. HER INSURANCE NO LONGER COVERS ACCUCHEK.   Marland Kitchen potassium chloride (KLOR-CON) 10 MEQ tablet TAKE 1 TABLET(10 MEQ) BY MOUTH TWICE DAILY   . rosuvastatin (CRESTOR) 10 MG tablet Take 1 tablet (10 mg total) by mouth daily.   Marland Kitchen triamterene-hydrochlorothiazide (MAXZIDE-25) 37.5-25 MG tablet Take 0.5 tablets by mouth daily.    No facility-administered encounter medications on file as of 04/25/2020.     Objective:   Goals Addressed              This Visit's Progress  Patient Stated   .  "I want to work on sugars" (pt-stated)        CARE PLAN ENTRY (see longtitudinal plan of care for additional care plan information)  Current Barriers:  . Diabetes: uncontrolled; complicated by chronic medical conditions including CKD, HTN, asthma, most recent A1c 7.8% o Reports that she has a very decreased appetite and fills up quickly. Unable to eat both a side and a meal, only able to eat one or the other. Worries about appetite decreasing  any further . Most recent eGFR: 56 mL/min . Current antihyperglycemic regimen: Tresiba 20 units daily, Ozempic 0.5 mg weekly o APPROVED for Ozempic and Tresiba assistance through 10/01/20 o Hx Jardiance, recurrent UTIs . Denies any episodes of hypoglycemia, no readings <70 . Current glucose readings:   Fasting Before Lunch Before Supper Bedtime  10-Jun 124     11-Jun 133  119   12-Jun 137   126  13-Jun 122     14-Jun  124    15-Jun 124     16-Jun 151  109   17-Jun 145     18-Jun    154  19-Jun 114     20-Jun 127     21-Jun    124  22-Jun 116     23-Jun 134   172  24-Jun 115     AVG 129 124 114 144   . Cardiovascular risk reduction: o Current hypertensive regimen: amlodipine 5 mg BID, losartan 100 mg QPM, triamterene/HCTZ 37.5/25 1/2 tab QAM; BP at goal on last visit  o Current hyperlipidemia regimen: rosuvastatin 10 mg daily; last LDL at goal <70 o Current antiplatelet regimen: none . Asthma/congestion: cetirizine 10 mg daily; Advair 250/50 mcg BID, PRN use of albuterol in nebulizer and Atrovent inhaler . Potassium 10 mEq BID. Last K level 4.0 mEq  Pharmacist Clinical Goal(s):  Marland Kitchen Over the next 90 days, patient will work with PharmD and primary care provider to address optimized medication management  Interventions: . Comprehensive medication review performed, medication list updated in electronic medical record . Inter-disciplinary care team collaboration (see longitudinal plan of care) . Discussed goal A1c, goal fasting, and goal 2 hour post prandial readings . Discussed maximizing Ozempic, however, patient uncomfortable with any further reduction in appetite. Will continue Ozempic 0.5 mg weekly . Increase Tresiba to 22 units daily. Patient counseled that if after 1 week, she still has many fastings >130, she can increase to 24 units daily    Patient Self Care Activities:  . Patient will check blood glucose BID, document, and provide at future appointments . Patient will  take medications as prescribed . Patient will report any questions or concerns to provider   Please see past updates related to this goal by clicking on the "Past Updates" button in the selected goal          Plan:  - Scheduled f/u call in ~ 12 weeks  [name] Darnelle Maffucci, PharmD, [place], [place] Pharmacist [place] [phone]

## 2020-04-29 ENCOUNTER — Telehealth: Payer: Self-pay

## 2020-04-29 NOTE — Telephone Encounter (Signed)
Called patient to let her know that her ozempic is ready for pick up. No answer. Left detailed message.

## 2020-05-21 ENCOUNTER — Ambulatory Visit (INDEPENDENT_AMBULATORY_CARE_PROVIDER_SITE_OTHER): Payer: PPO

## 2020-05-21 ENCOUNTER — Other Ambulatory Visit: Payer: Self-pay

## 2020-05-21 DIAGNOSIS — D518 Other vitamin B12 deficiency anemias: Secondary | ICD-10-CM

## 2020-05-21 MED ORDER — CYANOCOBALAMIN 1000 MCG/ML IJ SOLN
1000.0000 ug | Freq: Once | INTRAMUSCULAR | Status: AC
  Administered 2020-05-21: 1000 ug via INTRAMUSCULAR

## 2020-05-21 NOTE — Progress Notes (Addendum)
Patient presented for B 12 injection to right deltoid, patient voiced no concerns nor showed any signs of distress during injection.  Reviewed.  Dr Scott 

## 2020-05-30 ENCOUNTER — Telehealth: Payer: Self-pay | Admitting: Internal Medicine

## 2020-05-30 NOTE — Telephone Encounter (Signed)
Left message for patient to call back and schedule Medicare Annual Wellness Visit (AWV)   This should be a telephone visit only=30 minutes.  Last AWV 05/17/17; please schedule at anytime with Denisa O'Brien-Blaney at [facility].

## 2020-05-31 LAB — HM DIABETES EYE EXAM

## 2020-06-01 ENCOUNTER — Other Ambulatory Visit: Payer: Self-pay | Admitting: Internal Medicine

## 2020-06-06 ENCOUNTER — Other Ambulatory Visit: Payer: Self-pay

## 2020-06-06 ENCOUNTER — Ambulatory Visit (INDEPENDENT_AMBULATORY_CARE_PROVIDER_SITE_OTHER): Payer: PPO | Admitting: Internal Medicine

## 2020-06-06 ENCOUNTER — Encounter: Payer: Self-pay | Admitting: Internal Medicine

## 2020-06-06 DIAGNOSIS — Z794 Long term (current) use of insulin: Secondary | ICD-10-CM

## 2020-06-06 DIAGNOSIS — K5140 Inflammatory polyps of colon without complications: Secondary | ICD-10-CM

## 2020-06-06 DIAGNOSIS — K219 Gastro-esophageal reflux disease without esophagitis: Secondary | ICD-10-CM | POA: Diagnosis not present

## 2020-06-06 DIAGNOSIS — E559 Vitamin D deficiency, unspecified: Secondary | ICD-10-CM | POA: Diagnosis not present

## 2020-06-06 DIAGNOSIS — G479 Sleep disorder, unspecified: Secondary | ICD-10-CM

## 2020-06-06 DIAGNOSIS — I10 Essential (primary) hypertension: Secondary | ICD-10-CM

## 2020-06-06 DIAGNOSIS — F439 Reaction to severe stress, unspecified: Secondary | ICD-10-CM

## 2020-06-06 DIAGNOSIS — E7800 Pure hypercholesterolemia, unspecified: Secondary | ICD-10-CM | POA: Diagnosis not present

## 2020-06-06 DIAGNOSIS — E118 Type 2 diabetes mellitus with unspecified complications: Secondary | ICD-10-CM

## 2020-06-06 DIAGNOSIS — L4050 Arthropathic psoriasis, unspecified: Secondary | ICD-10-CM | POA: Diagnosis not present

## 2020-06-06 MED ORDER — TRAZODONE HCL 50 MG PO TABS: 25.0000 mg | ORAL_TABLET | Freq: Every evening | ORAL | 1 refills | Status: DC | PRN

## 2020-06-06 NOTE — Progress Notes (Signed)
Patient ID: Ashley Gross, female   DOB: Apr 03, 1951, 69 y.o.   MRN: [id number]   Subjective:    Patient ID: Ashley Gross, female    DOB: 01-Jul-1951, 69 y.o.   MRN: [id number]  HPI This visit occurred during the SARS-CoV-2 public health emergency.  Safety protocols were in place, including screening questions prior to the visit, additional usage of staff PPE, and extensive cleaning of exam room while observing appropriate contact time as indicated for disinfecting solutions.  Patient here for a scheduled follow up.  She reports she is doing better.  Has adjusted her diet.  Decreased portion sizes.  Working - now more in a routine.  Has lost weight.  Trying to stay active.  No chest pain or sob reported.  No abdominal pain or bowel change.  Stable. She is taking melatonin and tylenol pm to help her sleep.  Using nasal strips.  Still has issues with sleep.  Feels needs something to help her sleep.  Blood sugars appear to be doing better.  On ozempic and tresiba.  Sugars reviewed and attached.    Past Medical History:  Diagnosis Date   Abnormal liver function test 10/06/2012   Anemia, unspecified    Asthma    Colitis    Complication of anesthesia    asthma attack after shoulder surgery   Diabetes mellitus ([place])    Esophagitis    H/O hiatal hernia    Heart murmur    Hx of arteriovenous malformation (AVM)    Hypertension    IBS (irritable bowel syndrome)    Nephrolithiasis    Pernicious anemia    Personal history of colonic polyps 02/10/2013   02/08/13 colonoscopy - question of rectal varices, two 3-62m polyps in the sigmoid colon, mass at the hepatic flexure, one 564mpolyp at the hepatic flexure, nodule at the ileocecal valve, congested mucusa in the entire colon, flattened villi mucosa in the terminal ileum.     Pneumonia    [place]oriatic [place]   s/p penicillamine, plaquenil, MTX, sulfasalazine.  s/p Embrel, Humira,.  Iritis.     Pure hypercholesterolemia     Past Surgical History:  Procedure Laterality Date   ABDOMINAL HYSTERECTOMY  1981   ovaries left in place   ANKLE SURGERY     right   BUNIONECTOMY     CHOLECYSTECTOMY  1989   COLONOSCOPY WITH PROPOFOL N/A 12/27/2017   Procedure: COLONOSCOPY WITH PROPOFOL;  Surgeon: VaLin LandsmanMD;  [facility];  Service: Gastroenterology;  Laterality: N/A;   COLONOSCOPY WITH PROPOFOL N/A 01/04/2019   Procedure: COLONOSCOPY WITH PROPOFOL;  Surgeon: VaLin LandsmanMD;  [place];  Laterality: N/A;  Diabetic - oral and injectable   ESOPHAGOGASTRODUODENOSCOPY (EGD) WITH PROPOFOL N/A 12/27/2017   Procedure: ESOPHAGOGASTRODUODENOSCOPY (EGD) WITH PROPOFOL;  Surgeon: VaLin LandsmanMD;  [facility];  Service: Gastroenterology;  Laterality: N/A;   HAND SURGERY     right   HEEL SPUR SURGERY     NOSE SURGERY     x2   POLYPECTOMY  01/04/2019   Procedure: POLYPECTOMY;  Surgeon: VaLin LandsmanMD;  [place];;   SHOULDER ARTHROSCOPY WITH OPEN ROTATOR CUFF REPAIR Left 09/20/2018   Procedure: SHOULDER ARTHROSCOPY WITH OPEN ROTATOR CUFF REPAIR;  Surgeon: PoCorky MullMD;  [facility];  Service: Orthopedics;  Laterality: Left;   SHOULDER CLOSED REDUCTION Left 12/21/2018   Procedure: MANIPULATION UNDER ANESTHESIA  WITH STEROID INJECTION;  Surgeon: Corky Mull, MD;  Location: [place];  Service: Orthopedics;  Laterality: Left;  Diabetic - insulin and oral meds   UMBILICAL HERNIA REPAIR     Family History  Problem Relation Age of Onset   Diabetes Other    Hypertension Other    Breast cancer Neg Hx    Colon cancer Neg Hx    Social History   Socioeconomic History   Marital status: Married    Spouse name: Not on file   Number of children: 2   Years of education: Not on file   Highest education level: Not on file  Occupational History   Not on  file  Tobacco Use   Smoking status: Never Smoker   Smokeless tobacco: Never Used  Vaping Use   Vaping Use: Never used  Substance and Sexual Activity   Alcohol use: No    Alcohol/week: 0.0 standard drinks   Drug use: No   Sexual activity: Not Currently  Other Topics Concern   Not on file  Social History Narrative   She is married and has two children   Social Determinants of Radio broadcast assistant Strain: High Risk   Difficulty of Paying Living Expenses: Hard  Food Insecurity:    Worried About Charity fundraiser in the Last Year:    Arboriculturist in the Last Year:   Transportation Needs:    Film/video editor (Medical):    Lack of Transportation (Non-Medical):   Physical Activity:    Days of Exercise per Week:    Minutes of Exercise per Session:   Stress:    Feeling of Stress :   Social Connections:    Frequency of Communication with Friends and Family:    Frequency of Social Gatherings with Friends and Family:    Attends Religious Services:    Active Member of Clubs or Organizations:    Attends Archivist Meetings:    Marital Status:     Outpatient Encounter Medications as of 06/06/2020  Medication Sig   albuterol (ACCUNEB) 0.63 MG/3ML nebulizer solution Take 3 mLs (0.63 mg total) by nebulization every 6 (six) hours as needed for wheezing. (Patient not taking: Reported on 01/04/2020)   amLODipine (NORVASC) 5 MG tablet TAKE 1 TABLET(5 MG) BY MOUTH TWICE DAILY   budesonide-formoterol (SYMBICORT) 160-4.5 MCG/ACT inhaler Inhale 2 puffs into the lungs 2 (two) times daily.   Calcium Carbonate-Vit D-Min (CALCIUM 600+D PLUS MINERALS) 600-400 MG-UNIT TABS Take by mouth.   cetirizine (ZYRTEC) 10 MG tablet Take 10 mg by mouth daily.   Cholecalciferol (VITAMIN D-3) 1000 UNITS CAPS Take 1 capsule by mouth daily.   dicyclomine (BENTYL) 20 MG tablet Take 1 tablet (20 mg total) by mouth 4 (four) times daily -  before meals and at bedtime  for 30 days.   diphenhydrAMINE (BENADRYL) 25 mg capsule Take by mouth.   insulin degludec (TRESIBA FLEXTOUCH) 100 UNIT/ML FlexTouch Pen Inject 0.22 mLs (22 Units total) into the skin daily.   Insulin Pen Needle (BD PEN NEEDLE NANO U/F) 32G X 4 MM MISC Use to inject insulin daily   ipratropium (ATROVENT HFA) 17 MCG/ACT inhaler Inhale 2 puffs into the lungs every 6 (six) hours.   loperamide (IMODIUM) 2 MG capsule Take by mouth.   losartan (COZAAR) 100 MG tablet TAKE 1 TABLET BY MOUTH EVERY DAY   omeprazole (PRILOSEC) 20 MG capsule Take 1 capsule (20 mg total) by mouth 2 (two) times  daily.   ONE TOUCH ULTRA TEST test strip PATIENT NEEDS NEW METER STRIPS AND LANCETS FOR ONE TOUCH. HER INSURANCE NO LONGER COVERS ACCUCHEK.   potassium chloride (KLOR-CON) 10 MEQ tablet TAKE 1 TABLET(10 MEQ) BY MOUTH TWICE DAILY   rosuvastatin (CRESTOR) 10 MG tablet Take 1 tablet (10 mg total) by mouth daily.   Semaglutide,0.25 or 0.5MG/DOS, (OZEMPIC, 0.25 OR 0.5 MG/DOSE,) 2 MG/1.5ML SOPN Inject 0.5 mg weekly   traZODone (DESYREL) 50 MG tablet Take 0.5-1 tablets (25-50 mg total) by mouth at bedtime as needed for sleep.   triamterene-hydrochlorothiazide (MAXZIDE-25) 37.5-25 MG tablet TAKE 1/2 TABLET BY MOUTH DAILY   [DISCONTINUED] Fluticasone-Salmeterol (ADVAIR DISKUS) 250-50 MCG/DOSE AEPB Inhale 1 puff into the lungs 2 (two) times daily.   No facility-administered encounter medications on file as of 06/06/2020.    Review of Systems  Constitutional: Negative for appetite change and unexpected weight change.  HENT: Negative for congestion and sinus pressure.   Respiratory: Negative for cough, chest tightness and shortness of breath.   Cardiovascular: Negative for chest pain, palpitations and leg swelling.  Gastrointestinal: Negative for abdominal pain, diarrhea, nausea and vomiting.  Genitourinary: Negative for difficulty urinating and dysuria.  Musculoskeletal: Negative for joint swelling and myalgias.    Skin: Negative for color change and rash.  Neurological: Negative for dizziness, light-headedness and headaches.  Psychiatric/Behavioral: Positive for sleep disturbance. Negative for agitation and dysphoric mood.       Objective:    Physical Exam Vitals reviewed.  Constitutional:      General: She is not in acute distress.    Appearance: Normal appearance.  HENT:     Head: Normocephalic and atraumatic.     Right Ear: External ear normal.     Left Ear: External ear normal.  Eyes:     General: No scleral icterus.       Right eye: No discharge.        Left eye: No discharge.     Conjunctiva/sclera: Conjunctivae normal.  Neck:     Thyroid: No thyromegaly.  Cardiovascular:     Rate and Rhythm: Normal rate and regular rhythm.  Pulmonary:     Effort: No respiratory distress.     Breath sounds: Normal breath sounds. No wheezing.  Abdominal:     General: Bowel sounds are normal.     Palpations: Abdomen is soft.     Tenderness: There is no abdominal tenderness.  Musculoskeletal:        General: No swelling or tenderness.     Cervical back: Neck supple. No tenderness.  Lymphadenopathy:     Cervical: No cervical adenopathy.  Skin:    Findings: No erythema or rash.  Neurological:     Mental Status: She is alert.  Psychiatric:        Mood and Affect: Mood normal.        Behavior: Behavior normal.     BP 110/70    Pulse 89    Temp 98.1 F (36.7 C) (Oral)    Resp 16    Ht 5' 4" (1.626 m)    Wt 173 lb (78.5 kg)    SpO2 97%    BMI 29.70 kg/m  Wt Readings from Last 3 Encounters:  06/06/20 173 lb (78.5 kg)  03/05/20 190 lb (86.2 kg)  11/21/19 178 lb (80.7 kg)     Lab Results  Component Value Date   WBC 5.7 03/29/2020   HGB 12.3 03/29/2020   HCT 37.8 03/29/2020   PLT 241.0 03/29/2020  GLUCOSE 104 (H) 03/29/2020   CHOL 130 03/29/2020   TRIG 125.0 03/29/2020   HDL 49.30 03/29/2020   LDLDIRECT 74.0 04/19/2019   LDLCALC 55 03/29/2020   ALT 12 03/29/2020   AST 18  03/29/2020   NA 140 03/29/2020   K 4.1 03/29/2020   CL 104 03/29/2020   CREATININE 1.06 03/29/2020   BUN 18 03/29/2020   CO2 29 03/29/2020   TSH 2.35 08/21/2019   HGBA1C 7.8 (H) 03/29/2020   MICROALBUR 18.2 (H) 08/21/2019    DG Chest 2 View  Result Date: 11/28/2019 CLINICAL DATA:  Persistent cough and congestion for 6-7 months. History of hypertension. EXAM: CHEST - 2 VIEW COMPARISON:  Radiographs 08/13/2017. CT 09/11/2004. FINDINGS: The heart size and mediastinal contours are normal. The lungs are clear. There is no pleural effusion or pneumothorax. No acute osseous findings are identified. IMPRESSION: Stable chest.  No active cardiopulmonary process. Electronically Signed   By: Richardean Sale M.D.   On: 11/28/2019 10:03       Assessment & Plan:   Problem List Items Addressed This Visit    Controlled type 2 diabetes mellitus with complication, with long-term current use of insulin ([place])    Low carb diet and exercise.  Outside sugars reviewed.  Improved.  AM sugars 100-130 and 120 - 150 pm sugars.  Continue tresiba and ozempic.  Follow met b and a1c.       Relevant Orders   Hemoglobin A1c   GERD (gastroesophageal reflux disease)    Upper symptoms controlled on omeprazole.  Follow.        Hypercholesterolemia    On crestor.  Low cholesterol diet and exercise.  Follow lipid panel and liver function tests.        Relevant Orders   Hepatic function panel   Lipid panel   Hypertension    Blood pressure doing well on triam/hctz, amlodipine and losartan.  Follow pressures.  Follow metabolic panel.       Relevant Orders   Basic metabolic panel   Inflammatory polyps of colon ([place])    Followed by GI.  S/p colonoscopy with removal of multiple polyps (01/2019).       Psoriatic arthritis ([place])    Followed by Dr Jefm Bryant.  Stable.       Sleep difficulties    Trouble sleeping.  Discussed with her today.  Stop melatonin and tylenol pm.  Trial of trazodone as directed.  Follow.         Stress    Overall appears to be handling things well.  Follow. Sleep is an issue.  Trial of trazodone as directed.  Stop melatonin and tylenol pm.        Vitamin D deficiency    Follow vitamin d level.           Einar Pheasant, MD

## 2020-06-08 ENCOUNTER — Encounter: Payer: Self-pay | Admitting: Internal Medicine

## 2020-06-08 DIAGNOSIS — G479 Sleep disorder, unspecified: Secondary | ICD-10-CM | POA: Insufficient documentation

## 2020-06-08 NOTE — Assessment & Plan Note (Signed)
On crestor.  Low cholesterol diet and exercise.  Follow lipid panel and liver function tests.   

## 2020-06-08 NOTE — Assessment & Plan Note (Signed)
Trouble sleeping.  Discussed with her today.  Stop melatonin and tylenol pm.  Trial of trazodone as directed.  Follow.

## 2020-06-08 NOTE — Assessment & Plan Note (Signed)
Followed by Dr Kernodle.  Stable.  

## 2020-06-08 NOTE — Assessment & Plan Note (Signed)
Upper symptoms controlled on omeprazole.  Follow.   

## 2020-06-08 NOTE — Assessment & Plan Note (Signed)
Followed by GI.  S/p colonoscopy with removal of multiple polyps (01/2019).

## 2020-06-08 NOTE — Assessment & Plan Note (Signed)
Blood pressure doing well on triam/hctz, amlodipine and losartan.  Follow pressures.  Follow metabolic panel.

## 2020-06-08 NOTE — Assessment & Plan Note (Signed)
Low carb diet and exercise.  Outside sugars reviewed.  Improved.  AM sugars 100-130 and 120 - 150 pm sugars.  Continue tresiba and ozempic.  Follow met b and a1c.  

## 2020-06-08 NOTE — Assessment & Plan Note (Signed)
Follow vitamin d level.   

## 2020-06-08 NOTE — Assessment & Plan Note (Signed)
Overall appears to be handling things well.  Follow. Sleep is an issue.  Trial of trazodone as directed.  Stop melatonin and tylenol pm.

## 2020-06-25 ENCOUNTER — Other Ambulatory Visit: Payer: Self-pay

## 2020-06-25 ENCOUNTER — Ambulatory Visit (INDEPENDENT_AMBULATORY_CARE_PROVIDER_SITE_OTHER): Payer: PPO

## 2020-06-25 DIAGNOSIS — E538 Deficiency of other specified B group vitamins: Secondary | ICD-10-CM | POA: Diagnosis not present

## 2020-06-25 MED ORDER — CYANOCOBALAMIN 1000 MCG/ML IJ SOLN
1000.0000 ug | Freq: Once | INTRAMUSCULAR | Status: AC
  Administered 2020-06-25: 1000 ug via INTRAMUSCULAR

## 2020-06-25 NOTE — Progress Notes (Addendum)
Patient presented for B 12 injection to right deltoid, patient voiced no concerns nor showed any signs of distress during injection.  Reviewed.  Dr Scott 

## 2020-07-18 ENCOUNTER — Ambulatory Visit (INDEPENDENT_AMBULATORY_CARE_PROVIDER_SITE_OTHER): Payer: PPO | Admitting: Pharmacist

## 2020-07-18 ENCOUNTER — Telehealth: Payer: PPO

## 2020-07-18 DIAGNOSIS — E1165 Type 2 diabetes mellitus with hyperglycemia: Secondary | ICD-10-CM | POA: Diagnosis not present

## 2020-07-18 DIAGNOSIS — E7800 Pure hypercholesterolemia, unspecified: Secondary | ICD-10-CM | POA: Diagnosis not present

## 2020-07-18 DIAGNOSIS — I10 Essential (primary) hypertension: Secondary | ICD-10-CM

## 2020-07-18 DIAGNOSIS — G479 Sleep disorder, unspecified: Secondary | ICD-10-CM

## 2020-07-18 DIAGNOSIS — Z794 Long term (current) use of insulin: Secondary | ICD-10-CM

## 2020-07-18 NOTE — Patient Instructions (Signed)
Visit Information  Goals Addressed              This Visit's Progress     Patient Stated   .  "I want to work on sugars" (pt-stated)        CARE PLAN ENTRY (see longtitudinal plan of care for additional care plan information)  Current Barriers:  . Social, financial, and community barriers:  o Reports that her biggest struggle today is sleep. Has not perceived any benefit from trazodone. Is back to taking Tylenol PM. Notes she woke up at 1:30 am this morning and couldn't go back to sleep . Diabetes: uncontrolled; complicated by chronic medical conditions including CKD, HTN, asthma, most recent A1c 7.8% . Most recent eGFR: 56 mL/min . Current antihyperglycemic regimen: Tresiba 20 units daily, Ozempic 0.5 mg weekly o APPROVED for Ozempic and Tresiba assistance through 10/01/20 o Hx Jardiance, recurrent UTIs . Denies any episodes of hypoglycemia . Current glucose readings:  o Fastings: 90-130s o Post prandial: ~140-150s . Cardiovascular risk reduction: o Current hypertensive regimen: amlodipine 5 mg BID, losartan 100 mg QPM, triamterene/HCTZ 37.5/25 1/2 tab QAM, potassium 10 mEq BID; BP at goal on last clinic o Current hyperlipidemia regimen: rosuvastatin 10 mg daily; last LDL at goal <70 o Current antiplatelet regimen: none . Asthma/congestion: Symbicort 160/4.5 mcg 2 puffs BID; PRN use of albuterol in nebulizer and Atrovent inhaler. Reports that she feels her lung disease is well controlled at this time o APPROVED for Symbicort assistance through [place] through 11/01/20 . Insomnia: trazodone 25 mg QPM; melatonin QPM; o Wakes up at 6:30 am on work days, 5 am on husband's dialysis days. Denies caffeine in the afternoon, screen time before bed. Notes that she will occasionally take naps.   Pharmacist Clinical Goal(s):  . Over the next 90 days, patient will work with PharmD and primary care provider to address optimized medication management  Interventions: . Comprehensive medication  review performed, medication list updated in electronic medical record . Inter-disciplinary care team collaboration (see longitudinal plan of care) . Reviewed goal A1c, goal fasting, and goal 2 hour post prandial. Lab work tomorrow. Follow A1c.  . Discussed nonpharmacology good sleep hygiene. Reviewed risk of anticholinergics w/ taking Benadryl QPM. Encouraged to discuss sleep concerns w/ Dr. Scott at next appointment. Patient's variable sleep/wake times due to work schedule and husband's HD schedule are negatively contributing.  . BP and lipids well controlled. Fill history indicates adherence to RAAS and statin.    Patient Self Care Activities:  . Patient will check blood glucose BID, document, and provide at future appointments . Patient will take medications as prescribed . Patient will report any questions or concerns to provider   Please see past updates related to this goal by clicking on the "Past Updates" button in the selected goal         The patient verbalized understanding of instructions provided today and declined a print copy of patient instruction materials.   Plan:  - Scheduled f/u call in ~ 10-12 weeks  [name] , PharmD, BCACP, CPP Clinical Pharmacist [facility] [place] [facility] [phone]   

## 2020-07-18 NOTE — Chronic Care Management (AMB) (Signed)
Chronic Care Management   Follow Up Note   07/18/2020 Name: DANNE SCARDINA MRN: [id number] DOB: 1951-03-17  Referred by: Einar Pheasant, MD Reason for referral : Chronic Care Management (Medication Management)   SYLINA HENION is a 69 y.o. year old female who is a primary care patient of Einar Pheasant, MD. The CCM team was consulted for assistance with chronic disease management and care coordination needs.    Contacted patient for medication management review.   Review of patient status, including review of consultants reports, relevant laboratory and other test results, and collaboration with appropriate care team members and the patient's provider was performed as part of comprehensive patient evaluation and provision of chronic care management services.    SDOH (Social Determinants of Health) assessments performed: No See Care Plan activities for detailed interventions related to SDOH)  SDOH Interventions     Most Recent Value  SDOH Interventions  Financial Strain Interventions Other (Comment)  [manufacturer assistance]       Outpatient Encounter Medications as of 07/18/2020  Medication Sig Note  . amLODipine (NORVASC) 5 MG tablet TAKE 1 TABLET(5 MG) BY MOUTH TWICE DAILY   . budesonide-formoterol (SYMBICORT) 160-4.5 MCG/ACT inhaler Inhale 2 puffs into the lungs 2 (two) times daily.   . Calcium Carbonate-Vit D-Min (CALCIUM 600+D PLUS MINERALS) 600-400 MG-UNIT TABS Take by mouth.   . cetirizine (ZYRTEC) 10 MG tablet Take 10 mg by mouth daily.   . Cholecalciferol (VITAMIN D-3) 1000 UNITS CAPS Take 1 capsule by mouth daily.   . diphenhydrAMINE (BENADRYL) 25 mg capsule Take by mouth.   . insulin degludec (TRESIBA FLEXTOUCH) 100 UNIT/ML FlexTouch Pen Inject 0.22 mLs (22 Units total) into the skin daily.   . Insulin Pen Needle (BD PEN NEEDLE NANO U/F) 32G X 4 MM MISC Use to inject insulin daily   . ipratropium (ATROVENT HFA) 17 MCG/ACT inhaler Inhale 2 puffs into the lungs  every 6 (six) hours. 01/04/2020: Using PRN  . loperamide (IMODIUM) 2 MG capsule Take 2 mg by mouth as needed.  01/04/2020: Taking 1 QAM  . losartan (COZAAR) 100 MG tablet TAKE 1 TABLET BY MOUTH EVERY DAY   . omeprazole (PRILOSEC) 20 MG capsule Take 1 capsule (20 mg total) by mouth 2 (two) times daily.   . ONE TOUCH ULTRA TEST test strip PATIENT NEEDS NEW METER STRIPS AND LANCETS FOR ONE TOUCH. HER INSURANCE NO LONGER COVERS ACCUCHEK.   Marland Kitchen potassium chloride (KLOR-CON) 10 MEQ tablet TAKE 1 TABLET(10 MEQ) BY MOUTH TWICE DAILY   . rosuvastatin (CRESTOR) 10 MG tablet Take 1 tablet (10 mg total) by mouth daily.   . Semaglutide,0.25 or 0.5MG/DOS, (OZEMPIC, 0.25 OR 0.5 MG/DOSE,) 2 MG/1.5ML SOPN Inject 0.5 mg weekly   . traZODone (DESYREL) 50 MG tablet Take 0.5-1 tablets (25-50 mg total) by mouth at bedtime as needed for sleep.   Marland Kitchen triamterene-hydrochlorothiazide (MAXZIDE-25) 37.5-25 MG tablet TAKE 1/2 TABLET BY MOUTH DAILY   . albuterol (ACCUNEB) 0.63 MG/3ML nebulizer solution Take 3 mLs (0.63 mg total) by nebulization every 6 (six) hours as needed for wheezing. (Patient not taking: Reported on 07/18/2020) 12/21/2018: PRN, last used several months ago  . dicyclomine (BENTYL) 20 MG tablet Take 1 tablet (20 mg total) by mouth 4 (four) times daily -  before meals and at bedtime for 30 days. (Patient not taking: Reported on 07/18/2020) 01/04/2020: PRN stomach spasms   No facility-administered encounter medications on file as of 07/18/2020.     Objective:   Goals Addressed  This Visit's Progress     Patient Stated   .  "I want to work on sugars" (pt-stated)        CARE PLAN ENTRY (see longtitudinal plan of care for additional care plan information)  Current Barriers:  . Social, financial, and community barriers:  o Reports that her biggest struggle today is sleep. Has not perceived any benefit from trazodone. Is back to taking Tylenol PM. Notes she woke up at 1:30 am this morning and couldn't  go back to sleep . Diabetes: uncontrolled; complicated by chronic medical conditions including CKD, HTN, asthma, most recent A1c 7.8% . Most recent eGFR: 56 mL/min . Current antihyperglycemic regimen: Tresiba 20 units daily, Ozempic 0.5 mg weekly o APPROVED for Ozempic and Tresiba assistance through 10/01/20 o Hx Jardiance, recurrent UTIs . Denies any episodes of hypoglycemia . Current glucose readings:  o Fastings: 90-130s o Post prandial: ~140-150s . Cardiovascular risk reduction: o Current hypertensive regimen: amlodipine 5 mg BID, losartan 100 mg QPM, triamterene/HCTZ 37.5/25 1/2 tab QAM, potassium 10 mEq BID; BP at goal on last clinic o Current hyperlipidemia regimen: rosuvastatin 10 mg daily; last LDL at goal <70 o Current antiplatelet regimen: none . Asthma/congestion: Symbicort 160/4.5 mcg 2 puffs BID; PRN use of albuterol in nebulizer and Atrovent inhaler. Reports that she feels her lung disease is well controlled at this time o APPROVED for Symbicort assistance through [place] through 11/01/20 . Insomnia: trazodone 25 mg QPM; melatonin QPM; o Wakes up at 6:30 am on work days, 5 am on husband's dialysis days. Denies caffeine in the afternoon, screen time before bed. Notes that she will occasionally take naps.   Pharmacist Clinical Goal(s):  Marland Kitchen Over the next 90 days, patient will work with PharmD and primary care provider to address optimized medication management  Interventions: . Comprehensive medication review performed, medication list updated in electronic medical record . Inter-disciplinary care team collaboration (see longitudinal plan of care) . Reviewed goal A1c, goal fasting, and goal 2 hour post prandial. Lab work tomorrow. Follow A1c.  . Discussed nonpharmacology good sleep hygiene. Reviewed risk of anticholinergics w/ taking Benadryl QPM. Encouraged to discuss sleep concerns w/ Dr. Nicki Reaper at next appointment. Patient's variable sleep/wake times due to work schedule and  husband's HD schedule are negatively contributing.  . BP and lipids well controlled. Fill history indicates adherence to RAAS and statin.    Patient Self Care Activities:  . Patient will check blood glucose BID, document, and provide at future appointments . Patient will take medications as prescribed . Patient will report any questions or concerns to provider   Please see past updates related to this goal by clicking on the "Past Updates" button in the selected goal          Plan:  - Scheduled f/u call in ~ 10-12 weeks  [name] Darnelle Maffucci, PharmD, [place], [place] Pharmacist [place] [phone]

## 2020-07-19 ENCOUNTER — Other Ambulatory Visit: Payer: Self-pay

## 2020-07-19 ENCOUNTER — Other Ambulatory Visit (INDEPENDENT_AMBULATORY_CARE_PROVIDER_SITE_OTHER): Payer: PPO

## 2020-07-19 DIAGNOSIS — I10 Essential (primary) hypertension: Secondary | ICD-10-CM | POA: Diagnosis not present

## 2020-07-19 DIAGNOSIS — E118 Type 2 diabetes mellitus with unspecified complications: Secondary | ICD-10-CM

## 2020-07-19 DIAGNOSIS — Z794 Long term (current) use of insulin: Secondary | ICD-10-CM

## 2020-07-19 DIAGNOSIS — E7800 Pure hypercholesterolemia, unspecified: Secondary | ICD-10-CM | POA: Diagnosis not present

## 2020-07-19 LAB — BASIC METABOLIC PANEL
BUN: 14 mg/dL (ref 6–23)
CO2: 30 mEq/L (ref 19–32)
Calcium: 8.8 mg/dL (ref 8.4–10.5)
Chloride: 101 mEq/L (ref 96–112)
Creatinine, Ser: 1.15 mg/dL (ref 0.40–1.20)
GFR: 46.72 mL/min — ABNORMAL LOW (ref >60.00)
Glucose, Bld: 76 mg/dL (ref 70–99)
Potassium: 3.8 mEq/L (ref 3.5–5.1)
Sodium: 139 mEq/L (ref 135–145)

## 2020-07-19 LAB — LIPID PANEL
Cholesterol: 108 mg/dL (ref 0–200)
HDL: 48.6 mg/dL (ref >39.00)
LDL Cholesterol: 38 mg/dL (ref 0–99)
NonHDL: 59.62
Total CHOL/HDL Ratio: 2
Triglycerides: 110 mg/dL (ref 0.0–149.0)
VLDL: 22 mg/dL (ref 0.0–40.0)

## 2020-07-19 LAB — HEPATIC FUNCTION PANEL
ALT: 11 U/L (ref 0–35)
AST: 16 U/L (ref 0–37)
Albumin: 3.7 g/dL (ref 3.5–5.2)
Alkaline Phosphatase: 96 U/L (ref 39–117)
Bilirubin, Direct: 0.1 mg/dL (ref 0.0–0.3)
Total Bilirubin: 0.5 mg/dL (ref 0.2–1.2)
Total Protein: 5.9 g/dL — ABNORMAL LOW (ref 6.0–8.3)

## 2020-07-19 LAB — HEMOGLOBIN A1C: Hgb A1c MFr Bld: 6.2 % (ref 4.6–6.5)

## 2020-07-23 ENCOUNTER — Ambulatory Visit (INDEPENDENT_AMBULATORY_CARE_PROVIDER_SITE_OTHER): Payer: PPO | Admitting: Internal Medicine

## 2020-07-23 ENCOUNTER — Encounter: Payer: Self-pay | Admitting: Internal Medicine

## 2020-07-23 ENCOUNTER — Other Ambulatory Visit: Payer: Self-pay

## 2020-07-23 VITALS — BP 128/72 | HR 86 | Temp 98.6°F | Resp 16 | Ht 64.0 in | Wt 166.0 lb

## 2020-07-23 DIAGNOSIS — Z794 Long term (current) use of insulin: Secondary | ICD-10-CM

## 2020-07-23 DIAGNOSIS — I10 Essential (primary) hypertension: Secondary | ICD-10-CM | POA: Diagnosis not present

## 2020-07-23 DIAGNOSIS — E559 Vitamin D deficiency, unspecified: Secondary | ICD-10-CM

## 2020-07-23 DIAGNOSIS — E7800 Pure hypercholesterolemia, unspecified: Secondary | ICD-10-CM

## 2020-07-23 DIAGNOSIS — L4050 Arthropathic psoriasis, unspecified: Secondary | ICD-10-CM | POA: Diagnosis not present

## 2020-07-23 DIAGNOSIS — K219 Gastro-esophageal reflux disease without esophagitis: Secondary | ICD-10-CM | POA: Diagnosis not present

## 2020-07-23 DIAGNOSIS — Z23 Encounter for immunization: Secondary | ICD-10-CM | POA: Diagnosis not present

## 2020-07-23 DIAGNOSIS — G479 Sleep disorder, unspecified: Secondary | ICD-10-CM | POA: Diagnosis not present

## 2020-07-23 DIAGNOSIS — E538 Deficiency of other specified B group vitamins: Secondary | ICD-10-CM

## 2020-07-23 DIAGNOSIS — F439 Reaction to severe stress, unspecified: Secondary | ICD-10-CM

## 2020-07-23 DIAGNOSIS — E118 Type 2 diabetes mellitus with unspecified complications: Secondary | ICD-10-CM

## 2020-07-23 DIAGNOSIS — K5140 Inflammatory polyps of colon without complications: Secondary | ICD-10-CM | POA: Diagnosis not present

## 2020-07-23 MED ORDER — CYANOCOBALAMIN 1000 MCG/ML IJ SOLN
1000.0000 ug | Freq: Once | INTRAMUSCULAR | Status: AC
  Administered 2020-07-23: 1000 ug via INTRAMUSCULAR

## 2020-07-23 MED ORDER — CYCLOBENZAPRINE HCL 5 MG PO TABS: ORAL_TABLET | ORAL | 0 refills | Status: DC

## 2020-07-23 NOTE — Progress Notes (Signed)
Patient ID: Ashley Gross, female   DOB: [birth year], 69 y.o.   MRN: [id number]   Subjective:    Patient ID: Ashley Gross, female    DOB: 1951-08-05, 69 y.o.   MRN: [id number]  HPI This visit occurred during the SARS-CoV-2 public health emergency.  Safety protocols were in place, including screening questions prior to the visit, additional usage of staff PPE, and extensive cleaning of exam room while observing appropriate contact time as indicated for disinfecting solutions.  Patient here for a scheduled follow up.  She reports increased stress with her husband's health issues.  Trying to work and take him to dialysis and take care of him.  Discussed with her today.  Not sleeping well, but appears to be related to getting up with her husband.  Did take muscle relaxer which is helping her with her msk issues.  Helped her sleep as well.  Discussed using this short term.  Discussed counseling,etc.  Wants to follow.  Tries to stay active.  No chest pain or sob reported.  No abdominal pain or bowel change reported.  Blood pressure doing well.  Working with [name] - sugars/diabetes.    Past Medical History:  Diagnosis Date  . Abnormal liver function test 10/06/2012  . Anemia, unspecified   . Asthma   . Colitis   . Complication of anesthesia    asthma attack after shoulder surgery  . Diabetes mellitus ([place])   . Esophagitis   . H/O hiatal hernia   . Heart murmur   . Hx of arteriovenous malformation (AVM)   . Hypertension   . IBS (irritable bowel syndrome)   . Nephrolithiasis   . Pernicious anemia   . Personal history of colonic polyps 02/10/2013   02/08/13 colonoscopy - question of rectal varices, two 3-47mm polyps in the sigmoid colon, mass at the hepatic flexure, one 14mm polyp at the hepatic flexure, nodule at the ileocecal valve, congested mucusa in the entire colon, flattened villi mucosa in the terminal ileum.    . Pneumonia   . Psoriatic arthritis (HCC)    s/p penicillamine, plaquenil,  MTX, sulfasalazine.  s/p Embrel, Humira,.  Iritis.    . Pure hypercholesterolemia    Past Surgical History:  Procedure Laterality Date  . ABDOMINAL HYSTERECTOMY  1981   ovaries left in place  . ANKLE SURGERY     right  . BUNIONECTOMY    . CHOLECYSTECTOMY  1989  . COLONOSCOPY WITH PROPOFOL N/A 12/27/2017   Procedure: COLONOSCOPY WITH PROPOFOL;  Surgeon: Lin Landsman, MD;  Location: [facility] ENDOSCOPY;  Service: Gastroenterology;  Laterality: N/A;  . COLONOSCOPY WITH PROPOFOL N/A 01/04/2019   Procedure: COLONOSCOPY WITH PROPOFOL;  Surgeon: Lin Landsman, MD;  Location: Home;  Service: Endoscopy;  Laterality: N/A;  Diabetic - oral and injectable  . ESOPHAGOGASTRODUODENOSCOPY (EGD) WITH PROPOFOL N/A 12/27/2017   Procedure: ESOPHAGOGASTRODUODENOSCOPY (EGD) WITH PROPOFOL;  Surgeon: Lin Landsman, MD;  Location: [place];  Service: Gastroenterology;  Laterality: N/A;  . HAND SURGERY     right  . HEEL SPUR SURGERY    . NOSE SURGERY     x2  . POLYPECTOMY  01/04/2019   Procedure: POLYPECTOMY;  Surgeon: Lin Landsman, MD;  Location: [place];  Service: Endoscopy;;  . SHOULDER ARTHROSCOPY WITH OPEN ROTATOR CUFF REPAIR Left 09/20/2018   Procedure: SHOULDER ARTHROSCOPY WITH OPEN ROTATOR CUFF REPAIR;  Surgeon: Corky Mull, MD;  Location: [facility];  Service: Orthopedics;  Laterality: Left;  .  SHOULDER CLOSED REDUCTION Left 12/21/2018   Procedure: MANIPULATION UNDER ANESTHESIA WITH STEROID INJECTION;  Surgeon: Corky Mull, MD;  Location: [place];  Service: Orthopedics;  Laterality: Left;  Diabetic - insulin and oral meds  . UMBILICAL HERNIA REPAIR     Family History  Problem Relation Age of Onset  . Diabetes Other   . Hypertension Other   . Breast cancer Neg Hx   . Colon cancer Neg Hx    Social History   Socioeconomic History  . Marital status: Married    Spouse name: Not on file  . Number of children: 2  . Years of education:  Not on file  . Highest education level: Not on file  Occupational History  . Not on file  Tobacco Use  . Smoking status: Never Smoker  . Smokeless tobacco: Never Used  Vaping Use  . Vaping Use: Never used  Substance and Sexual Activity  . Alcohol use: No    Alcohol/week: 0.0 standard drinks  . Drug use: No  . Sexual activity: Not Currently  Other Topics Concern  . Not on file  Social History Narrative   She is married and has two children   Social Determinants of Health   Financial Resource Strain: Low Risk   . Difficulty of Paying Living Expenses: Not very hard  Food Insecurity:   . Worried About Charity fundraiser in the Last Year: Not on file  . Ran Out of Food in the Last Year: Not on file  Transportation Needs:   . Lack of Transportation (Medical): Not on file  . Lack of Transportation (Non-Medical): Not on file  Physical Activity:   . Days of Exercise per Week: Not on file  . Minutes of Exercise per Session: Not on file  Stress:   . Feeling of Stress : Not on file  Social Connections:   . Frequency of Communication with Friends and Family: Not on file  . Frequency of Social Gatherings with Friends and Family: Not on file  . Attends Religious Services: Not on file  . Active Member of Clubs or Organizations: Not on file  . Attends Archivist Meetings: Not on file  . Marital Status: Not on file    Outpatient Encounter Medications as of 07/23/2020  Medication Sig  . albuterol (ACCUNEB) 0.63 MG/3ML nebulizer solution Take 3 mLs (0.63 mg total) by nebulization every 6 (six) hours as needed for wheezing. (Patient not taking: Reported on 07/18/2020)  . amLODipine (NORVASC) 5 MG tablet TAKE 1 TABLET(5 MG) BY MOUTH TWICE DAILY  . budesonide-formoterol (SYMBICORT) 160-4.5 MCG/ACT inhaler Inhale 2 puffs into the lungs 2 (two) times daily.  . Calcium Carbonate-Vit D-Min (CALCIUM 600+D PLUS MINERALS) 600-400 MG-UNIT TABS Take by mouth.  . cetirizine (ZYRTEC) 10 MG  tablet Take 10 mg by mouth daily.  . Cholecalciferol (VITAMIN D-3) 1000 UNITS CAPS Take 1 capsule by mouth daily.  . cyclobenzaprine (FLEXERIL) 5 MG tablet Take one tablet q pm prn  . dicyclomine (BENTYL) 20 MG tablet Take 1 tablet (20 mg total) by mouth 4 (four) times daily -  before meals and at bedtime for 30 days. (Patient not taking: Reported on 07/18/2020)  . diphenhydrAMINE (BENADRYL) 25 mg capsule Take by mouth.  . insulin degludec (TRESIBA FLEXTOUCH) 100 UNIT/ML FlexTouch Pen Inject 0.22 mLs (22 Units total) into the skin daily.  . Insulin Pen Needle (BD PEN NEEDLE NANO U/F) 32G X 4 MM MISC Use to inject insulin daily  .  ipratropium (ATROVENT HFA) 17 MCG/ACT inhaler Inhale 2 puffs into the lungs every 6 (six) hours.  Marland Kitchen loperamide (IMODIUM) 2 MG capsule Take 2 mg by mouth as needed.   Marland Kitchen losartan (COZAAR) 100 MG tablet TAKE 1 TABLET BY MOUTH EVERY DAY  . omeprazole (PRILOSEC) 20 MG capsule Take 1 capsule (20 mg total) by mouth 2 (two) times daily.  . ONE TOUCH ULTRA TEST test strip PATIENT NEEDS NEW METER STRIPS AND LANCETS FOR ONE TOUCH. HER INSURANCE NO LONGER COVERS ACCUCHEK.  Marland Kitchen potassium chloride (KLOR-CON) 10 MEQ tablet TAKE 1 TABLET(10 MEQ) BY MOUTH TWICE DAILY  . rosuvastatin (CRESTOR) 10 MG tablet Take 1 tablet (10 mg total) by mouth daily.  . Semaglutide,0.25 or 0.5MG /DOS, (OZEMPIC, 0.25 OR 0.5 MG/DOSE,) 2 MG/1.5ML SOPN Inject 0.5 mg weekly  . triamterene-hydrochlorothiazide (MAXZIDE-25) 37.5-25 MG tablet TAKE 1/2 TABLET BY MOUTH DAILY  . [DISCONTINUED] traZODone (DESYREL) 50 MG tablet Take 0.5-1 tablets (25-50 mg total) by mouth at bedtime as needed for sleep.  . [EXPIRED] cyanocobalamin ((VITAMIN B-12)) injection 1,000 mcg    No facility-administered encounter medications on file as of 07/23/2020.   Review of Systems  Constitutional: Positive for fatigue. Negative for appetite change and unexpected weight change.  HENT: Negative for congestion and sinus pressure.     Respiratory: Negative for cough, chest tightness and shortness of breath.   Cardiovascular: Negative for chest pain, palpitations and leg swelling.  Gastrointestinal: Negative for abdominal pain, diarrhea, nausea and vomiting.  Genitourinary: Negative for difficulty urinating and dysuria.  Musculoskeletal: Negative for joint swelling and myalgias.  Skin: Negative for color change and rash.  Neurological: Negative for dizziness, light-headedness and headaches.  Psychiatric/Behavioral: Negative for agitation.       Increased stress as outlined.         Objective:    Physical Exam Vitals reviewed.  Constitutional:      General: She is not in acute distress.    Appearance: Normal appearance.  HENT:     Head: Normocephalic and atraumatic.     Right Ear: External ear normal.     Left Ear: External ear normal.  Eyes:     General: No scleral icterus.       Right eye: No discharge.        Left eye: No discharge.     Conjunctiva/sclera: Conjunctivae normal.  Neck:     Thyroid: No thyromegaly.  Cardiovascular:     Rate and Rhythm: Normal rate and regular rhythm.  Pulmonary:     Effort: No respiratory distress.     Breath sounds: Normal breath sounds. No wheezing.  Abdominal:     General: Bowel sounds are normal.     Palpations: Abdomen is soft.     Tenderness: There is no abdominal tenderness.  Musculoskeletal:        General: No swelling or tenderness.     Cervical back: Neck supple. No tenderness.  Lymphadenopathy:     Cervical: No cervical adenopathy.  Skin:    Findings: No erythema or rash.  Neurological:     Mental Status: She is alert.  Psychiatric:        Mood and Affect: Mood normal.        Behavior: Behavior normal.     BP 128/72   Pulse 86   Temp 98.6 F (37 C) (Oral)   Resp 16   Ht 5\' 4"  (1.626 m)   Wt 166 lb (75.3 kg)   SpO2 98%   BMI 28.49 kg/m  Wt Readings from Last 3 Encounters:  07/23/20 166 lb (75.3 kg)  06/06/20 173 lb (78.5 kg)  03/05/20  190 lb (86.2 kg)     Lab Results  Component Value Date   WBC 5.7 03/29/2020   HGB 12.3 03/29/2020   HCT 37.8 03/29/2020   PLT 241.0 03/29/2020   GLUCOSE 76 07/19/2020   CHOL 108 07/19/2020   TRIG 110.0 07/19/2020   HDL 48.60 07/19/2020   LDLDIRECT 74.0 04/19/2019   LDLCALC 38 07/19/2020   ALT 11 07/19/2020   AST 16 07/19/2020   NA 139 07/19/2020   K 3.8 07/19/2020   CL 101 07/19/2020   CREATININE 1.15 07/19/2020   BUN 14 07/19/2020   CO2 30 07/19/2020   TSH 2.35 08/21/2019   HGBA1C 6.2 07/19/2020   MICROALBUR 18.2 (H) 08/21/2019    DG Chest 2 View  Result Date: 11/28/2019 CLINICAL DATA:  Persistent cough and congestion for 6-7 months. History of hypertension. EXAM: CHEST - 2 VIEW COMPARISON:  Radiographs 08/13/2017. CT 09/11/2004. FINDINGS: The heart size and mediastinal contours are normal. The lungs are clear. There is no pleural effusion or pneumothorax. No acute osseous findings are identified. IMPRESSION: Stable chest.  No active cardiopulmonary process. Electronically Signed   By: Richardean Sale M.D.   On: 11/28/2019 10:03       Assessment & Plan:   Problem List Items Addressed This Visit    Vitamin D deficiency    Follow vitamin D level.       Stress    Increased stress as outlined.  Discussed.  Sleeping is an issue.  Getting up with her husband.  With msk issues.  Flexeril helps.  Short trial to help with neck/msk issues and may help with sleep.  Follow.  Hold on additional medication since on flexeril.        Sleep difficulties    Trial of flexeril as outlined.  Follow.  Stop benadryl.       Psoriatic arthritis ([place])    Followed by Dr Jefm Bryant.       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  S/p colonoscopy with removal of multiple polyps.        Hypertension    Blood pressure doing well on current medication regimen.  Continue triam/hctz, amoldipine and losartan.   Follow pressures.  Follow metabolic panel.       Relevant Orders   Basic metabolic panel   TSH   Hypercholesterolemia    On crestor.  Low cholesterol diet and exercise.  Follow lipid panel and liver function tests.        Relevant Orders   Hepatic function panel   Lipid panel   GERD (gastroesophageal reflux disease)    No upper symptoms reported. On omeprazole.       Controlled type 2 diabetes mellitus with complication, with long-term current use of insulin (HCC)    Sugars doing well on ozempic.  a1c 6.2.  having GI issues - not wanting to eat, etc.  Did not have this issue on the lower dose.  Decrease back to .25 dose.  Follow GI symptoms.  Follow sugars.       Relevant Orders   Microalbumin / creatinine urine ratio   Hemoglobin A1c   B12 deficiency    Continue b12 injections.        Other Visit Diagnoses    Need for immunization against influenza    -  Primary   Relevant Orders   Flu Vaccine QUAD High Dose(Fluad) (Completed)       Einar Pheasant, MD

## 2020-07-28 ENCOUNTER — Encounter: Payer: Self-pay | Admitting: Internal Medicine

## 2020-07-28 NOTE — Assessment & Plan Note (Signed)
Increased stress as outlined.  Discussed.  Sleeping is an issue.  Getting up with her husband.  With msk issues.  Flexeril helps.  Short trial to help with neck/msk issues and may help with sleep.  Follow.  Hold on additional medication since on flexeril.

## 2020-07-28 NOTE — Assessment & Plan Note (Signed)
Followed by GI.  S/p colonoscopy with removal of multiple polyps.

## 2020-07-28 NOTE — Assessment & Plan Note (Signed)
Blood pressure doing well on current medication regimen.  Continue triam/hctz, amoldipine and losartan.  Follow pressures.  Follow metabolic panel.

## 2020-07-28 NOTE — Assessment & Plan Note (Addendum)
Sugars doing well on ozempic.  a1c 6.2.  having GI issues - not wanting to eat, etc.  Did not have this issue on the lower dose.  Decrease back to .25 dose.  Follow GI symptoms.  Follow sugars.

## 2020-07-28 NOTE — Assessment & Plan Note (Signed)
Follow vitamin D level.  

## 2020-07-28 NOTE — Assessment & Plan Note (Signed)
Continue b12 injections.  

## 2020-07-28 NOTE — Assessment & Plan Note (Signed)
Trial of flexeril as outlined.  Follow.  Stop benadryl.

## 2020-07-28 NOTE — Assessment & Plan Note (Signed)
On crestor.  Low cholesterol diet and exercise.  Follow lipid panel and liver function tests.   

## 2020-07-28 NOTE — Assessment & Plan Note (Signed)
Followed by Dr Kernodle.   

## 2020-07-28 NOTE — Assessment & Plan Note (Signed)
No upper symptoms reported.  On omeprazole.  

## 2020-07-31 ENCOUNTER — Other Ambulatory Visit: Payer: Self-pay | Admitting: Internal Medicine

## 2020-08-01 ENCOUNTER — Encounter: Payer: Self-pay | Admitting: Internal Medicine

## 2020-08-02 ENCOUNTER — Telehealth: Payer: Self-pay

## 2020-08-02 NOTE — Telephone Encounter (Signed)
Confirmed with patient no chest pain, chest tightness, SOB, etc. She feels like she has a pulled muscle. Pain is in the rib area and is worse with deep breath, coughing, sneezing, etc. Confirmed with patient that she is not having any respiratory symptoms. Requested to be scheduled with NP on Monday. Agreed to go to ED if symptoms worsen or develops new symptoms.

## 2020-08-02 NOTE — Telephone Encounter (Signed)
Pt called and states that she went to the walk-in clinic at [facility] clinic for pain under left breast. They told her to go to the ED because of the wait time and she refused. She states that she thinks she pulled something or has a broken rib. She wants orders for a xray. Please call pt back at your earliest convenience.

## 2020-08-05 ENCOUNTER — Other Ambulatory Visit: Payer: Self-pay

## 2020-08-05 ENCOUNTER — Encounter: Payer: Self-pay | Admitting: Nurse Practitioner

## 2020-08-05 ENCOUNTER — Ambulatory Visit (INDEPENDENT_AMBULATORY_CARE_PROVIDER_SITE_OTHER): Payer: PPO | Admitting: Nurse Practitioner

## 2020-08-05 ENCOUNTER — Ambulatory Visit (INDEPENDENT_AMBULATORY_CARE_PROVIDER_SITE_OTHER): Payer: PPO

## 2020-08-05 VITALS — BP 120/68 | HR 66 | Temp 98.2°F | Ht 64.0 in | Wt 163.0 lb

## 2020-08-05 DIAGNOSIS — R0789 Other chest pain: Secondary | ICD-10-CM | POA: Insufficient documentation

## 2020-08-05 DIAGNOSIS — R0781 Pleurodynia: Secondary | ICD-10-CM | POA: Diagnosis not present

## 2020-08-05 NOTE — Progress Notes (Signed)
Established Patient Office Visit  Subjective:  Patient ID: Ashley Gross, female    DOB: [birth year]  Age: 69 y.o. MRN: [id number]  CC:  Chief Complaint  Patient presents with  . Acute Visit    left rib pain    HPI Ashley Gross presents for left rib pain onset Fri onset 0400- woke her out of a sound sleep. She went to bed the night before and feeling fine.  When she went to get up and felt stabbing pain. Worse if sneeze, cough or make a sudden move- it grabs her. She sees a bulge of some kind in that area in the mirror. No injury or trauma. She has been helping her husband with getting up and on out of chair and commode. She has been doing lifting. No  SOB , DOE, dizzy or lightheadedness.  She is taking Flexeril at [facility] and that helps. Taking Tylenol during the day is OK. She has not pain unless she sneezes or takes a deep breath or laughs. She  works as Education officer, community at [employer].  Known RA and steroid use. Dexa in 2018: osteopenia.   Past Medical History:  Diagnosis Date  . Abnormal liver function test 10/06/2012  . Anemia, unspecified   . Asthma   . Colitis   . Complication of anesthesia    asthma attack after shoulder surgery  . Diabetes mellitus ([place] )   . Esophagitis   . H/O hiatal hernia   . Heart murmur   . Hx of arteriovenous malformation (AVM)   . Hypertension   . IBS (irritable bowel syndrome)   . Nephrolithiasis   . Pernicious anemia   . Personal history of colonic polyps 02/10/2013   02/08/13 colonoscopy - question of rectal varices, two 3-67mm polyps in the sigmoid colon, mass at the hepatic flexure, one 12mm polyp at the hepatic flexure, nodule at the ileocecal valve, congested mucusa in the entire colon, flattened villi mucosa in the terminal ileum.    . Pneumonia   . Psoriatic arthritis (HCC)    s/p penicillamine, plaquenil, MTX, sulfasalazine.  s/p Embrel, Humira,.  Iritis.    . Pure hypercholesterolemia     Past Surgical History:  Procedure Laterality  Date  . ABDOMINAL HYSTERECTOMY  1981   ovaries left in place  . ANKLE SURGERY     right  . BUNIONECTOMY    . CHOLECYSTECTOMY  1989  . COLONOSCOPY WITH PROPOFOL N/A 12/27/2017   Procedure: COLONOSCOPY WITH PROPOFOL;  Surgeon: Lin Landsman, MD;  Location: [facility];  Service: Gastroenterology;  Laterality: N/A;  . COLONOSCOPY WITH PROPOFOL N/A 01/04/2019   Procedure: COLONOSCOPY WITH PROPOFOL;  Surgeon: Lin Landsman, MD;  Location: [place];  Service: Endoscopy;  Laterality: N/A;  Diabetic - oral and injectable  . ESOPHAGOGASTRODUODENOSCOPY (EGD) WITH PROPOFOL N/A 12/27/2017   Procedure: ESOPHAGOGASTRODUODENOSCOPY (EGD) WITH PROPOFOL;  Surgeon: Lin Landsman, MD;  Location: [place];  Service: Gastroenterology;  Laterality: N/A;  . HAND SURGERY     right  . HEEL SPUR SURGERY    . NOSE SURGERY     x2  . POLYPECTOMY  01/04/2019   Procedure: POLYPECTOMY;  Surgeon: Lin Landsman, MD;  Location: [place];  Service: Endoscopy;;  . SHOULDER ARTHROSCOPY WITH OPEN ROTATOR CUFF REPAIR Left 09/20/2018   Procedure: SHOULDER ARTHROSCOPY WITH OPEN ROTATOR CUFF REPAIR;  Surgeon: Corky Mull, MD;  Location: [facility];  Service: Orthopedics;  Laterality: Left;  . SHOULDER CLOSED REDUCTION  Left 12/21/2018   Procedure: MANIPULATION UNDER ANESTHESIA WITH STEROID INJECTION;  Surgeon: Corky Mull, MD;  Location: [place];  Service: Orthopedics;  Laterality: Left;  Diabetic - insulin and oral meds  . UMBILICAL HERNIA REPAIR      Family History  Problem Relation Age of Onset  . Diabetes Other   . Hypertension Other   . Breast cancer Neg Hx   . Colon cancer Neg Hx     Social History   Socioeconomic History  . Marital status: Married    Spouse name: Not on file  . Number of children: 2  . Years of education: Not on file  . Highest education level: Not on file  Occupational History  . Not on file  Tobacco Use  . Smoking status: Never  Smoker  . Smokeless tobacco: Never Used  Vaping Use  . Vaping Use: Never used  Substance and Sexual Activity  . Alcohol use: No    Alcohol/week: 0.0 standard drinks  . Drug use: No  . Sexual activity: Not Currently  Other Topics Concern  . Not on file  Social History Narrative   She is married and has two children   Social Determinants of Health   Financial Resource Strain: Low Risk   . Difficulty of Paying Living Expenses: Not very hard  Food Insecurity:   . Worried About Charity fundraiser in the Last Year: Not on file  . Ran Out of Food in the Last Year: Not on file  Transportation Needs:   . Lack of Transportation (Medical): Not on file  . Lack of Transportation (Non-Medical): Not on file  Physical Activity:   . Days of Exercise per Week: Not on file  . Minutes of Exercise per Session: Not on file  Stress:   . Feeling of Stress : Not on file  Social Connections:   . Frequency of Communication with Friends and Family: Not on file  . Frequency of Social Gatherings with Friends and Family: Not on file  . Attends Religious Services: Not on file  . Active Member of Clubs or Organizations: Not on file  . Attends Archivist Meetings: Not on file  . Marital Status: Not on file  Intimate Partner Violence:   . Fear of Current or Ex-Partner: Not on file  . Emotionally Abused: Not on file  . Physically Abused: Not on file  . Sexually Abused: Not on file    Outpatient Medications Prior to Visit  Medication Sig Dispense Refill  . albuterol (ACCUNEB) 0.63 MG/3ML nebulizer solution Take 3 mLs (0.63 mg total) by nebulization every 6 (six) hours as needed for wheezing. 75 mL 1  . amLODipine (NORVASC) 5 MG tablet TAKE 1 TABLET(5 MG) BY MOUTH TWICE DAILY 180 tablet 1  . budesonide-formoterol (SYMBICORT) 160-4.5 MCG/ACT inhaler Inhale 2 puffs into the lungs 2 (two) times daily. 3 Inhaler 2  . Calcium Carbonate-Vit D-Min (CALCIUM 600+D PLUS MINERALS) 600-400 MG-UNIT TABS Take  by mouth.    . cetirizine (ZYRTEC) 10 MG tablet Take 10 mg by mouth daily.    . Cholecalciferol (VITAMIN D-3) 1000 UNITS CAPS Take 1 capsule by mouth daily.    . cyclobenzaprine (FLEXERIL) 5 MG tablet Take one tablet q pm prn 30 tablet 0  . diphenhydrAMINE (BENADRYL) 25 mg capsule Take by mouth.    . insulin degludec (TRESIBA FLEXTOUCH) 100 UNIT/ML FlexTouch Pen Inject 0.22 mLs (22 Units total) into the skin daily. 12 mL 2  . Insulin  Pen Needle (BD PEN NEEDLE NANO U/F) 32G X 4 MM MISC Use to inject insulin daily 100 each 3  . ipratropium (ATROVENT HFA) 17 MCG/ACT inhaler Inhale 2 puffs into the lungs every 6 (six) hours.    Marland Kitchen loperamide (IMODIUM) 2 MG capsule Take 2 mg by mouth as needed.     Marland Kitchen losartan (COZAAR) 100 MG tablet TAKE 1 TABLET BY MOUTH EVERY DAY 90 tablet 1  . omeprazole (PRILOSEC) 20 MG capsule Take 1 capsule (20 mg total) by mouth 2 (two) times daily. 180 capsule 1  . ONE TOUCH ULTRA TEST test strip PATIENT NEEDS NEW METER STRIPS AND LANCETS FOR ONE TOUCH. HER INSURANCE NO LONGER COVERS ACCUCHEK. 100 each PRN  . potassium chloride (KLOR-CON) 10 MEQ tablet TAKE 1 TABLET(10 MEQ) BY MOUTH TWICE DAILY 180 tablet 1  . rosuvastatin (CRESTOR) 10 MG tablet Take 1 tablet (10 mg total) by mouth daily. 90 tablet 1  . Semaglutide,0.25 or 0.5MG /DOS, (OZEMPIC, 0.25 OR 0.5 MG/DOSE,) 2 MG/1.5ML SOPN Inject 0.5 mg weekly 1 pen 0  . triamterene-hydrochlorothiazide (MAXZIDE-25) 37.5-25 MG tablet TAKE 1/2 TABLET BY MOUTH DAILY 45 tablet 1  . dicyclomine (BENTYL) 20 MG tablet Take 1 tablet (20 mg total) by mouth 4 (four) times daily -  before meals and at bedtime for 30 days. (Patient not taking: Reported on 07/18/2020) 120 tablet 0   No facility-administered medications prior to visit.    Allergies  Allergen Reactions  . Aspirin Other (See Comments)    Increased bleeding  . Beta Adrenergic Blockers   . Lisinopril     cough  . Mtx Support [Cobalamine Combinations] Other (See Comments)     Respiratory symptoms  . Vasotec [Enalapril] Cough  . Verapamil Other (See Comments)    Heart block  . Voltaren [Diclofenac Sodium]   . Erythromycin Other (See Comments)    Gi intolerance  . Indocin [Indomethacin]   . Jardiance [Empagliflozin] Other (See Comments)    Recurrent yeast infections, even with washout and rechallenge    Review of Systems  Constitutional: Negative for chills, fatigue and fever.  HENT: Negative for congestion and sinus pressure.   Respiratory: Negative for cough, chest tightness, shortness of breath and wheezing.   Cardiovascular: Negative for chest pain, palpitations and leg swelling.  Gastrointestinal: Negative for abdominal pain and constipation.  Musculoskeletal:       Left inferior rib pain.  Neurological: Negative for dizziness, syncope, weakness, light-headedness and headaches.  Hematological: Negative for adenopathy. Does not bruise/bleed easily.      Objective:    Physical Exam Vitals reviewed.  Constitutional:      Appearance: Normal appearance. She is normal weight.  HENT:     Head: Normocephalic and atraumatic.  Cardiovascular:     Rate and Rhythm: Normal rate and regular rhythm.     Pulses: Normal pulses.     Heart sounds: Normal heart sounds.  Pulmonary:     Effort: Pulmonary effort is normal.     Breath sounds: Normal breath sounds. No wheezing, rhonchi or rales.  Chest:     Chest wall: Tenderness present.  Abdominal:     Palpations: Abdomen is soft.     Tenderness: There is no abdominal tenderness.  Musculoskeletal:        General: Normal range of motion.     Cervical back: Normal range of motion and neck supple.     Comments: Positive left anterior rib tender T10-T12 level with palpation- reproduces pain. Twisting causes pain left  rib area.   Skin:    General: Skin is warm and dry.  Neurological:     Mental Status: She is alert.  Psychiatric:        Mood and Affect: Mood normal.        Behavior: Behavior normal.    BP  120/68 (BP Location: Left Arm, Patient Position: Sitting, Cuff Size: Normal)   Pulse 66   Temp 98.2 F (36.8 C) (Oral)   Ht 5\' 4"  (1.626 m)   Wt 163 lb (73.9 kg)   SpO2 99%   BMI 27.98 kg/m  Wt Readings from Last 3 Encounters:  08/05/20 163 lb (73.9 kg)  07/23/20 166 lb (75.3 kg)  06/06/20 173 lb (78.5 kg)   Pulse Readings from Last 3 Encounters:  08/05/20 66  07/23/20 86  06/06/20 89    BP Readings from Last 3 Encounters:  08/05/20 120/68  07/23/20 128/72  06/06/20 110/70    Lab Results  Component Value Date   CHOL 108 07/19/2020   HDL 48.60 07/19/2020   LDLCALC 38 07/19/2020   LDLDIRECT 74.0 04/19/2019   TRIG 110.0 07/19/2020   CHOLHDL 2 07/19/2020      Health Maintenance Due  Topic Date Due  . Hepatitis C Screening  Never done  . TETANUS/TDAP  Never done  . PNA vac Low Risk Adult (2 of 2 - PPSV23) 05/17/2018  . COLONOSCOPY  01/04/2020    There are no preventive care reminders to display for this patient.  Lab Results  Component Value Date   TSH 2.35 08/21/2019   Lab Results  Component Value Date   WBC 5.7 03/29/2020   HGB 12.3 03/29/2020   HCT 37.8 03/29/2020   MCV 90.3 03/29/2020   PLT 241.0 03/29/2020   Lab Results  Component Value Date   NA 139 07/19/2020   K 3.8 07/19/2020   CO2 30 07/19/2020   GLUCOSE 76 07/19/2020   BUN 14 07/19/2020   CREATININE 1.15 07/19/2020   BILITOT 0.5 07/19/2020   ALKPHOS 96 07/19/2020   AST 16 07/19/2020   ALT 11 07/19/2020   PROT 5.9 (L) 07/19/2020   ALBUMIN 3.7 07/19/2020   CALCIUM 8.8 07/19/2020   ANIONGAP 9 11/28/2019   GFR 46.72 (L) 07/19/2020   Lab Results  Component Value Date   CHOL 108 07/19/2020   Lab Results  Component Value Date   HDL 48.60 07/19/2020   Lab Results  Component Value Date   LDLCALC 38 07/19/2020   Lab Results  Component Value Date   TRIG 110.0 07/19/2020   Lab Results  Component Value Date   CHOLHDL 2 07/19/2020   Lab Results  Component Value Date   HGBA1C 6.2  07/19/2020      Assessment & Plan:   Problem List Items Addressed This Visit      Other   Rib pain on left side - Primary   Relevant Orders   DG Ribs Unilateral Left (Completed)   DG Chest 2 View      No orders of the defined types were placed in this encounter. History of rheumatoid arthritis with steroid use in past and osteopenia. She has left rib pain that is clearly reproducible with palpation and with sudden movements. She has no SOB/DOE,  cough, URI symptoms, pulse ox is 99% and lung auscultation is within normal. She has been lifting and repositioning her husband.  Differential diagnosis to include costochondritis, rib fracture, and doubt pulmonary etiology.  Begin work up with left  rib x-ray now.  We will call you with results.  For the left rib pain continue with Tylenol.  You may apply heat alternating ice whichever feels better.  You could purchase a pain patch over-the-counter called salon pass and apply that over the tender area.  Addendum: Unilateral left lower rib x-ray shows no rib fracture.  There is slight elevation of the left hemidiaphragm.  Minimal density in the left lung base may reflect atelectasis or scarring.  Plan: Patient needs to have a chest x-ray.  Attempting to reach her to advise.  Follow-up: Return in about 1 week (around 08/12/2020).   This visit occurred during the SARS-CoV-2 public health emergency.  Safety protocols were in place, including screening questions prior to the visit, additional usage of staff PPE, and extensive cleaning of exam room while observing appropriate contact time as indicated for disinfecting solutions.    Denice Paradise, NP

## 2020-08-05 NOTE — Patient Instructions (Addendum)
Please go to the x-ray now.  We will call you with results.  For the left rib pain continue with Tylenol.  You may apply heat alternating ice whichever feels better.  You could purchase a pain patch over-the-counter called salon pass and apply that over the tender area.    Chest Wall Pain Chest wall pain is pain in or around the bones and muscles of your chest. Sometimes, an injury causes this pain. Excessive coughing or overuse of arm and chest muscles may also cause chest wall pain. Sometimes, the cause may not be known. This pain may take several weeks or longer to get better. Follow these instructions at home: Managing pain, stiffness, and swelling   If directed, put ice on the painful area: ? Put ice in a plastic bag. ? Place a towel between your skin and the bag. ? Leave the ice on for 20 minutes, 2-3 times per day. Activity  Rest as told by your health care provider.  Avoid activities that cause pain. These include any activities that use your chest muscles or your abdominal and side muscles to lift heavy items. Ask your health care provider what activities are safe for you. General instructions   Take over-the-counter and prescription medicines only as told by your health care provider.  Do not use any products that contain nicotine or tobacco, such as cigarettes, e-cigarettes, and chewing tobacco. These can delay healing after injury. If you need help quitting, ask your health care provider.  Keep all follow-up visits as told by your health care provider. This is important. Contact a health care provider if:  You have a fever.  Your chest pain becomes worse.  You have new symptoms. Get help right away if:  You have nausea or vomiting.  You feel sweaty or light-headed.  You have a cough with mucus from your lungs (sputum) or you cough up blood.  You develop shortness of breath. These symptoms may represent a serious problem that is an emergency. Do not wait to see  if the symptoms will go away. Get medical help right away. Call your local emergency services (911 in the [place] Do not drive yourself to the hospital. Summary  Chest wall pain is pain in or around the bones and muscles of your chest.  Depending on the cause, it may be treated with ice, rest, medicines, and avoiding activities that cause pain.  Contact a health care provider if you have a fever, worsening chest pain, or new symptoms.  Get help right away if you feel light-headed or you develop shortness of breath. These symptoms may be an emergency. This information is not intended to replace advice given to you by your health care provider. Make sure you discuss any questions you have with your health care provider. Document Revised: 04/21/2018 Document Reviewed: 04/21/2018 Elsevier Patient Education  2020 [employer].

## 2020-08-06 ENCOUNTER — Telehealth: Payer: Self-pay | Admitting: Internal Medicine

## 2020-08-06 NOTE — Telephone Encounter (Signed)
Pt called to get xray results

## 2020-08-06 NOTE — Telephone Encounter (Signed)
Yes I am ok with PA

## 2020-08-07 NOTE — Telephone Encounter (Signed)
Sent patient a Therapist, music per Maudie Mercury to have patient come in for chest x ray

## 2020-08-07 NOTE — Telephone Encounter (Signed)
Patient is scheduled to come in tomorrow for chest xray

## 2020-08-07 NOTE — Telephone Encounter (Signed)
LMTCB

## 2020-08-08 ENCOUNTER — Other Ambulatory Visit: Payer: Self-pay

## 2020-08-08 ENCOUNTER — Ambulatory Visit (INDEPENDENT_AMBULATORY_CARE_PROVIDER_SITE_OTHER): Payer: PPO

## 2020-08-08 ENCOUNTER — Other Ambulatory Visit: Payer: PPO

## 2020-08-08 DIAGNOSIS — R0781 Pleurodynia: Secondary | ICD-10-CM

## 2020-08-13 NOTE — Addendum Note (Signed)
Addended by: Ezequiel Ganser on: 08/13/2020 10:21 AM   Modules accepted: Orders

## 2020-08-13 NOTE — Telephone Encounter (Addendum)
Prior Authorization for Cyclobenzaprine 10 mg tablets has been submitted to CoverMyMeds and has been approved.   [id number]  I have pended the 10mg  dose of Cyclobenzaprine. Ok to [employer]?

## 2020-08-14 MED ORDER — CYCLOBENZAPRINE HCL 10 MG PO TABS
ORAL_TABLET | ORAL | 0 refills | Status: DC
Start: 2020-08-14 — End: 2020-09-30

## 2020-08-14 NOTE — Telephone Encounter (Signed)
rx sent in for flexeril.  Please notify pt.

## 2020-08-14 NOTE — Telephone Encounter (Signed)
Pt aware.

## 2020-08-14 NOTE — Addendum Note (Signed)
Addended by: Alisa Graff on: 08/14/2020 04:55 AM   Modules accepted: Orders

## 2020-08-30 DIAGNOSIS — M778 Other enthesopathies, not elsewhere classified: Secondary | ICD-10-CM | POA: Diagnosis not present

## 2020-08-30 DIAGNOSIS — B351 Tinea unguium: Secondary | ICD-10-CM | POA: Diagnosis not present

## 2020-08-30 DIAGNOSIS — M79674 Pain in right toe(s): Secondary | ICD-10-CM | POA: Diagnosis not present

## 2020-08-30 DIAGNOSIS — M79675 Pain in left toe(s): Secondary | ICD-10-CM | POA: Diagnosis not present

## 2020-08-30 DIAGNOSIS — Q6689 Other  specified congenital deformities of feet: Secondary | ICD-10-CM | POA: Diagnosis not present

## 2020-08-30 DIAGNOSIS — L600 Ingrowing nail: Secondary | ICD-10-CM | POA: Diagnosis not present

## 2020-08-31 ENCOUNTER — Other Ambulatory Visit: Payer: Self-pay | Admitting: Internal Medicine

## 2020-09-30 ENCOUNTER — Ambulatory Visit: Payer: PPO | Admitting: Pharmacist

## 2020-09-30 ENCOUNTER — Other Ambulatory Visit: Payer: Self-pay | Admitting: Internal Medicine

## 2020-09-30 DIAGNOSIS — G479 Sleep disorder, unspecified: Secondary | ICD-10-CM

## 2020-09-30 DIAGNOSIS — Z794 Long term (current) use of insulin: Secondary | ICD-10-CM

## 2020-09-30 DIAGNOSIS — E7800 Pure hypercholesterolemia, unspecified: Secondary | ICD-10-CM

## 2020-09-30 DIAGNOSIS — R0781 Pleurodynia: Secondary | ICD-10-CM

## 2020-09-30 DIAGNOSIS — I10 Essential (primary) hypertension: Secondary | ICD-10-CM

## 2020-09-30 NOTE — Patient Instructions (Signed)
Visit Information  Patient Care Plan: Medication Management    Problem Identified: Diabetes, Hypertension, Hyperlipidemia     Long-Range Goal: Disease Progression Prevention   This Visit's Progress: On track  Priority: High  Note:   Current Barriers:  . Unable to independently afford treatment regimen  Pharmacist Clinical Goal(s):  Marland Kitchen Over the next 90 days, patient will verbalize ability to afford treatment regimen. . Over the next 90 days, patient will maintain control of diabetes as evidenced by improvement in A1c through collaboration with PharmD and provider.   Interventions: . Inter-disciplinary care team collaboration (see longitudinal plan of care) . Comprehensive medication review performed; medication list updated in electronic medical record  Diabetes: . Controlled; current treatment: Ozempic 0.25 mg weekly (reduced from 0.5 mg d/t GI upset), Tresiba 22 units daily; receiving these medications from [employer] patient assistance. Requests Ozempic refill today.  o Hx recurrent UTIs on Jardiance,  . Current glucose readings: denies having checked recently  . Endorses ~15 lbs weight loss since starting Ozempic therapy . Reports occasional hypoglycemic episodes (BG <70), but no discernable pattern of fasting vs post prandial . Discussed that we could use clicks in between 0.17 and 0.5 mg dose to find an in between dose with greater efficacy but improved tolerability if needed. Will collaborate w/ clinical staff to call patient and schedule f/u with Dr Nicki Reaper.  . Will collaborate w/ patient, provider, and CPhT for patient assistance reapplication  Hypertension: . Controlled; current treatment: amlodipine 5 mg BID, losartan 100 mg QPM, triamterene/HCTZ 37.5/25 1/2 tab QAM, potassium 10 mEq BID; BP at goal on last clinic . Current home readings: 115-120/70s . Denies hypotensive symptoms . Recommended to continue current regimen.  Encouraged continued home BP checks, especially if  she continues to lose weight, to monitor for need to reduce antihypertensive regimen  Hyperlipidemia: . Controlled; current treatment: rosuvastatin 10 mg daily . Recommended to continue current regimen  Asthma: . Controlled; current regimen: Symbicort 160/4.5 mcg 2 puffs BID. Denies need for rescue inhaler or nebulizer therapy in months . Recommended to continue current regimen.  . Assisted patient in online [employer] application request for Symbicort patient assistance for 2022. Will collaborate w/ CPhT for follow up.  Sleep/Pain: . Uncontrolled; reports having to provide more physical care for her husband recently (s/p hospitalization d/t GI bleed, resulting in weakness per her report), notes she may have pulled something in her back. Improved w/ heating pad and cyclobenzaprine. This was refilled today.  . Encouraged continued regimen and collaboration w/ PCP. Encouraged to consider outside help to help w/ husband's physical care, if it does not improve  . Denies benefit from trazodone. Occasionally taking diphenhydramine for sleep if she does not feel she needs cyclobenzaprine. Reviewed risk of sedation, CNS depression  Patient Goals/Self-Care Activities . Over the next 90 days, patient will:  - take medications as prescribed check blood glucose periodically, document, and provide at future appointments check blood pressure periodically, document, and provide at future appointments collaborate with provider on medication access solutions  Follow Up Plan: Telephone follow up appointment with care management team member scheduled for:~ 10 weeks     The patient verbalized understanding of instructions, educational materials, and care plan provided today and declined offer to receive copy of patient instructions, educational materials, and care plan.    Plan: Telephone follow up appointment with care management team member scheduled for:~ 10 weeks  [name] Darnelle Maffucci, PharmD, [place],  [place] [facility] [phone]

## 2020-09-30 NOTE — Chronic Care Management (AMB) (Signed)
Chronic Care Management   Pharmacy Note  09/30/2020 Name: Ashley Gross MRN: [id number] DOB: 01/11/1951   Subjective:  Ashley Gross is a 69 y.o. year old female who is a primary care patient of Einar Pheasant, MD. The CCM team was consulted for assistance with chronic disease management and care coordination needs.    Engaged with patient by telephone for follow up visit in response to provider referral for pharmacy case management and/or care coordination services.   Consent to Services:  @M @ @LNAME @ was given information about Chronic Care Management services, agreed to services, and gave verbal consent prior to initiation of services on 01/04/20. Please see initial visit note for detailed documentation.   SDOH (Social Determinants of Health) assessments and interventions performed:  SDOH Interventions     Most Recent Value  SDOH Interventions  Financial Strain Interventions Other (Comment)  [manufacturer assistance]  Stress Interventions Provide Counseling       Objective:  Lab Results  Component Value Date   CREATININE 1.15 07/19/2020   CREATININE 1.06 03/29/2020   CREATININE 1.03 (H) 11/28/2019    Lab Results  Component Value Date   HGBA1C 6.2 07/19/2020       Component Value Date/Time   CHOL 108 07/19/2020 0913   TRIG 110.0 07/19/2020 0913   HDL 48.60 07/19/2020 0913   CHOLHDL 2 07/19/2020 0913   VLDL 22.0 07/19/2020 0913   LDLCALC 38 07/19/2020 0913   LDLCALC 56 11/01/2018 1035   LDLDIRECT 74.0 04/19/2019 1056      BP Readings from Last 3 Encounters:  08/05/20 120/68  07/23/20 128/72  06/06/20 110/70    Assessment/Interventions: Review of patient past medical history, allergies, medications, health status, including review of consultants reports, laboratory and other test data, was performed as part of comprehensive evaluation and provision of chronic care management services.   Allergies  Allergen Reactions  . Aspirin Other (See Comments)      Increased bleeding  . Beta Adrenergic Blockers   . Lisinopril     cough  . Mtx Support [Cobalamine Combinations] Other (See Comments)    Respiratory symptoms  . Vasotec [Enalapril] Cough  . Verapamil Other (See Comments)    Heart block  . Voltaren [Diclofenac Sodium]   . Erythromycin Other (See Comments)    Gi intolerance  . Indocin [Indomethacin]   . Jardiance [Empagliflozin] Other (See Comments)    Recurrent yeast infections, even with washout and rechallenge    Medications Reviewed Today    Reviewed by De Hollingshead, RPH-CPP (Pharmacist) on 09/30/20 at 1541  Med List Status: <None>  Medication Order Taking? Sig Documenting Provider Last Dose Status Informant  albuterol (ACCUNEB) 0.63 MG/3ML nebulizer solution [id number] No Take 3 mLs (0.63 mg total) by nebulization every 6 (six) hours as needed for wheezing.  Patient not taking: Reported on 09/30/2020   Delano Metz, [place] Not Taking Active            Med Note [facility], Willey Blade   Wed Dec 21, 2018  9:38 AM) PRN, last used several months ago  amLODipine (NORVASC) 5 MG tablet [id number] Yes TAKE 1 TABLET(5 MG) BY MOUTH TWICE DAILY Einar Pheasant, MD Taking Active   budesonide-formoterol [facility]) 160-4.5 MCG/ACT inhaler [id number] Yes Inhale 2 puffs into the lungs 2 (two) times daily. Einar Pheasant, MD Taking Active   Calcium Carbonate-Vit D-Min (CALCIUM 600+D PLUS MINERALS) 600-400 MG-UNIT TABS [id number] Yes Take by mouth. [provider] Taking Active   cetirizine (ZYRTEC) 10 MG tablet  [id number] Yes Take 10 mg by mouth daily. [provider] Taking Active   Cholecalciferol (VITAMIN D-3) 1000 UNITS CAPS [id number] Yes Take 1 capsule by mouth daily. [provider] Taking Active   cyclobenzaprine (FLEXERIL) 10 MG tablet [id number] Yes TAKE 1 TABLET BY MOUTH EVERY NIGHT AS NEEDED Einar Pheasant, MD Taking Active   diphenhydrAMINE (BENADRYL) 25 mg capsule [id number] No Take by mouth.  Patient not  taking: Reported on 09/30/2020   [provider] Not Taking Active            Med Note Darnelle Maffucci, Maralyn Sago Jan 04, 2020 11:12 AM)    insulin degludec (TRESIBA FLEXTOUCH) 100 UNIT/ML FlexTouch Pen [id number] Yes Inject 0.22 mLs (22 Units total) into the skin daily. Einar Pheasant, MD Taking Active   Insulin Pen Needle (BD PEN NEEDLE NANO U/F) 32G X 4 MM MISC [id number] Yes Use to inject insulin daily Einar Pheasant, MD Taking Active   ipratropium (ATROVENT HFA) 17 MCG/ACT inhaler [id number] No Inhale 2 puffs into the lungs every 6 (six) hours.  Patient not taking: Reported on 09/30/2020   [provider] Not Taking Active            Med Note De Hollingshead   Thu Jan 04, 2020 11:12 AM) Using PRN  loperamide (IMODIUM) 2 MG capsule [id number] Yes Take 2 mg by mouth as needed.  [provider] Taking Active            Med Note Nat Christen Jan 04, 2020 11:14 AM) Taking 1 QAM  losartan (COZAAR) 100 MG tablet [id number] Yes TAKE 1 TABLET BY MOUTH EVERY DAY Einar Pheasant, MD Taking Active   omeprazole (PRILOSEC) 20 MG capsule [id number] Yes TAKE 1 CAPSULE(20 MG) BY MOUTH TWICE DAILY Einar Pheasant, MD Taking Active   ONE TOUCH ULTRA TEST test strip [id number] Yes PATIENT NEEDS NEW METER STRIPS AND LANCETS FOR ONE TOUCH. HER INSURANCE NO LONGER COVERS ACCUCHEK. Einar Pheasant, MD Taking Active   potassium chloride (KLOR-CON) 10 MEQ tablet [id number] Yes TAKE 1 TABLET(10 MEQ) BY MOUTH TWICE DAILY Einar Pheasant, MD Taking Active   rosuvastatin (CRESTOR) 10 MG tablet [id number] Yes TAKE 1 TABLET(10 MG) BY MOUTH DAILY Einar Pheasant, MD Taking Active   Semaglutide,0.25 or 0.5MG /DOS, (OZEMPIC, 0.25 OR 0.5 MG/DOSE,) 2 MG/1.5ML SOPN [id number] Yes Inject 0.5 mg weekly Einar Pheasant, MD Taking Active            Med Note Mayo Ao Sep 30, 2020  3:34 PM) 0.25 mg weekly  triamterene-hydrochlorothiazide (MAXZIDE-25) 37.5-25 MG tablet [id number] Yes  TAKE 1/2 TABLET BY MOUTH DAILY Einar Pheasant, MD Taking Active           Patient Active Problem List   Diagnosis Date Noted  . Rib pain on left side 08/05/2020  . Sleep difficulties 06/08/2020  . Swelling of lower extremity 11/26/2019  . Healthcare maintenance 09/13/2019  . Sinus congestion 02/11/2019  . B12 deficiency 02/11/2019  . Asthma 12/28/2018  . 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 Vitamin D deficiency 11/01/2018  . Tendinitis of upper biceps tendon of left shoulder 09/20/2018  . Depression 09/15/2018  . Mild intermittent asthma without complication 70/48/8891  . Controlled type 2 diabetes mellitus with complication, with long-term current use of insulin ([place]) 09/15/2018  . 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 Traumatic incomplete tear of left rotator cuff 09/09/2018  . Bleeding internal hemorrhoids 09/13/2017  .  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 Rectal bleeding 07/15/2017  . Low back pain 03/29/2016  . Stress 12/12/2014  . Congestion of throat 01/06/2014  . Chronic diarrhea 02/16/2013  . Inflammatory polyps of colon ([place]) 02/16/2013  . Hypertension 10/06/2012  . GERD (gastroesophageal reflux disease) 10/06/2012  . Hypercholesterolemia 10/06/2012  . Psoriatic arthritis ([place]) 06/29/2012    Medication Assistance: Application for Ozempic, Tresiba, and Symbicort medication assistance program in process. Anticipated assistance start date TBD. See plan of care below for additional detail.     Patient Care Plan: Medication Management    Problem Identified: Diabetes, Hypertension, Hyperlipidemia     Long-Range Goal: Disease Progression Prevention   This Visit's Progress: On track  Priority: High  Note:   Current Barriers:  . Unable to independently afford treatment regimen  Pharmacist Clinical Goal(s):  Marland Kitchen Over the next 90 days, patient will verbalize ability to afford treatment  regimen. . Over the next 90 days, patient will maintain control of diabetes as evidenced by improvement in A1c through collaboration with PharmD and provider.   Interventions: . Inter-disciplinary care team collaboration (see longitudinal plan of care) . Comprehensive medication review performed; medication list updated in electronic medical record  Diabetes: . Controlled; current treatment: Ozempic 0.25 mg weekly (reduced from 0.5 mg d/t GI upset), Tresiba 22 units daily; receiving these medications from [employer] patient assistance. Requests Ozempic refill today.  o Hx recurrent UTIs on Jardiance,  . Current glucose readings: denies having checked recently  . Endorses ~15 lbs weight loss since starting Ozempic therapy . Reports occasional hypoglycemic episodes (BG <70), but no discernable pattern of fasting vs post prandial . Discussed that we could use clicks in between 5.46 and 0.5 mg dose to find an in between dose with greater efficacy but improved tolerability if needed. Will collaborate w/ clinical staff to call patient and schedule f/u with Dr Nicki Reaper.  . Will collaborate w/ patient, provider, and CPhT for patient assistance reapplication  Hypertension: . Controlled; current treatment: amlodipine 5 mg BID, losartan 100 mg QPM, triamterene/HCTZ 37.5/25 1/2 tab QAM, potassium 10 mEq BID; BP at goal on last clinic . Current home readings: 115-120/70s . Denies hypotensive symptoms . Recommended to continue current regimen.  Encouraged continued home BP checks, especially if she continues to lose weight, to monitor for need to reduce antihypertensive regimen  Hyperlipidemia: . Controlled; current treatment: rosuvastatin 10 mg daily . Recommended to continue current regimen  Asthma: . Controlled; current regimen: Symbicort 160/4.5 mcg 2 puffs BID. Denies need for rescue inhaler or nebulizer therapy in months . Recommended to continue current regimen.  . Assisted patient in [place] application request for Symbicort patient assistance for 2022. Will collaborate w/ CPhT for follow up.  Sleep/Pain: . Uncontrolled; reports having to provide more physical care for her husband recently (s/p hospitalization d/t GI bleed, resulting in weakness per her report), notes she may have pulled something in her back. Improved w/ heating pad and cyclobenzaprine. This was refilled today.  . Encouraged continued regimen and collaboration w/ PCP. Encouraged to consider outside help to help w/ husband's physical care, if it does not improve  . Denies benefit from trazodone. Occasionally taking diphenhydramine for sleep if she does not feel she needs cyclobenzaprine. Reviewed risk of sedation, CNS depression  Patient Goals/Self-Care Activities . Over the next 90 days, patient will:  - take medications as prescribed check blood glucose periodically, document, and provide at future appointments  check blood pressure periodically, document, and provide at future appointments collaborate with provider on medication access solutions  Follow Up Plan: Telephone follow up appointment with care management team member scheduled for:~ 10 weeks      Plan: Telephone follow up appointment with care management team member scheduled for:~ 10 weeks  [name] Darnelle Maffucci, PharmD, [place], [place] Pharmacist [place] [phone]

## 2020-10-01 NOTE — Progress Notes (Signed)
LMTCB and schedule

## 2020-10-02 ENCOUNTER — Ambulatory Visit: Payer: PPO | Admitting: Pharmacist

## 2020-10-02 ENCOUNTER — Telehealth: Payer: Self-pay

## 2020-10-02 DIAGNOSIS — Z794 Long term (current) use of insulin: Secondary | ICD-10-CM

## 2020-10-02 DIAGNOSIS — F439 Reaction to severe stress, unspecified: Secondary | ICD-10-CM

## 2020-10-02 DIAGNOSIS — E7800 Pure hypercholesterolemia, unspecified: Secondary | ICD-10-CM

## 2020-10-02 DIAGNOSIS — I10 Essential (primary) hypertension: Secondary | ICD-10-CM

## 2020-10-02 NOTE — Telephone Encounter (Signed)
LMTCB. Pt needs appt with PCP to discuss sleep

## 2020-10-02 NOTE — Chronic Care Management (AMB) (Signed)
Chronic Care Management   Pharmacy Note  10/02/2020 Name: Ashley Gross MRN: [id number] DOB: [birth year]   Subjective:  Ashley Gross is a 69 y.o. year old female who is a primary care patient of Einar Pheasant, MD. The CCM team was consulted for assistance with chronic disease management and care coordination needs.    Care coordination provided in response to provider referral for pharmacy case management and/or care coordination services.   Consent to Services:  [name] was given information about Chronic Care Management services, agreed to services, and gave verbal consent prior to initiation of services on 01/04/2020. Please see initial visit note for detailed documentation.   SDOH (Social Determinants of Health) assessments and interventions performed:  SDOH Interventions     Most Recent Value  SDOH Interventions  Financial Strain Interventions Other (Comment)  [manufacturer assistance]       Objective:  Lab Results  Component Value Date   CREATININE 1.15 07/19/2020   CREATININE 1.06 03/29/2020   CREATININE 1.03 (H) 11/28/2019    Lab Results  Component Value Date   HGBA1C 6.2 07/19/2020       Component Value Date/Time   CHOL 108 07/19/2020 0913   TRIG 110.0 07/19/2020 0913   HDL 48.60 07/19/2020 0913   CHOLHDL 2 07/19/2020 0913   VLDL 22.0 07/19/2020 0913   LDLCALC 38 07/19/2020 0913   LDLCALC 56 11/01/2018 1035   LDLDIRECT 74.0 04/19/2019 1056    BP Readings from Last 3 Encounters:  08/05/20 120/68  07/23/20 128/72  06/06/20 110/70    Assessment/Interventions: Review of patient past medical history, allergies, medications, health status, including review of consultants reports, laboratory and other test data, was performed as part of comprehensive evaluation and provision of chronic care management services.   Allergies  Allergen Reactions   Aspirin Other (See Comments)    Increased bleeding   Beta Adrenergic Blockers    Lisinopril      cough   Mtx Support [Cobalamine Combinations] Other (See Comments)    Respiratory symptoms   Vasotec [Enalapril] Cough   Verapamil Other (See Comments)    Heart block   Voltaren [Diclofenac Sodium]    Erythromycin Other (See Comments)    Gi intolerance   Indocin [Indomethacin]    Jardiance [Empagliflozin] Other (See Comments)    Recurrent yeast infections, even with washout and rechallenge    Medications Reviewed Today    Reviewed by De Hollingshead, RPH-CPP (Pharmacist) on 09/30/20 at 1541  Med List Status: <None>  Medication Order Taking? Sig Documenting Provider Last Dose Status Informant  albuterol (ACCUNEB) 0.63 MG/3ML nebulizer solution [id number] No Take 3 mLs (0.63 mg total) by nebulization every 6 (six) hours as needed for wheezing.  Patient not taking: Reported on 09/30/2020   Delano Metz, [place] Not Taking Active            Med Note [facility], Willey Blade   Wed Dec 21, 2018  9:38 AM) PRN, last used several months ago  amLODipine (NORVASC) 5 MG tablet [id number] Yes TAKE 1 TABLET(5 MG) BY MOUTH TWICE DAILY Einar Pheasant, MD Taking Active   budesonide-formoterol [facility]) 160-4.5 MCG/ACT inhaler [id number] Yes Inhale 2 puffs into the lungs 2 (two) times daily. Einar Pheasant, MD Taking Active   Calcium Carbonate-Vit D-Min (CALCIUM 600+D PLUS MINERALS) 600-400 MG-UNIT TABS [id number] Yes Take by mouth. [provider] Taking Active   cetirizine (ZYRTEC) 10 MG tablet [id number] Yes Take 10 mg by mouth daily. [provider] Taking Active  Cholecalciferol (VITAMIN D-3) 1000 UNITS CAPS [id number] Yes Take 1 capsule by mouth daily. [provider] Taking Active   cyclobenzaprine (FLEXERIL) 10 MG tablet [id number] Yes TAKE 1 TABLET BY MOUTH EVERY NIGHT AS NEEDED Einar Pheasant, MD Taking Active   diphenhydrAMINE (BENADRYL) 25 mg capsule [id number] No Take by mouth.  Patient not taking: Reported on 09/30/2020   [provider] Not Taking  Active            Med Note Darnelle Maffucci, Maralyn Sago Jan 04, 2020 11:12 AM)    insulin degludec (TRESIBA FLEXTOUCH) 100 UNIT/ML FlexTouch Pen [id number] Yes Inject 0.22 mLs (22 Units total) into the skin daily. Einar Pheasant, MD Taking Active   Insulin Pen Needle (BD PEN NEEDLE NANO U/F) 32G X 4 MM MISC [id number] Yes Use to inject insulin daily Einar Pheasant, MD Taking Active   ipratropium (ATROVENT HFA) 17 MCG/ACT inhaler [id number] No Inhale 2 puffs into the lungs every 6 (six) hours.  Patient not taking: Reported on 09/30/2020   [provider] Not Taking Active            Med Note De Hollingshead   Thu Jan 04, 2020 11:12 AM) Using PRN  loperamide (IMODIUM) 2 MG capsule [id number] Yes Take 2 mg by mouth as needed.  [provider] Taking Active            Med Note Nat Christen Jan 04, 2020 11:14 AM) Taking 1 QAM  losartan (COZAAR) 100 MG tablet [id number] Yes TAKE 1 TABLET BY MOUTH EVERY DAY Einar Pheasant, MD Taking Active   omeprazole (PRILOSEC) 20 MG capsule [id number] Yes TAKE 1 CAPSULE(20 MG) BY MOUTH TWICE DAILY Einar Pheasant, MD Taking Active   ONE TOUCH ULTRA TEST test strip [id number] Yes PATIENT NEEDS NEW METER STRIPS AND LANCETS FOR ONE TOUCH. HER INSURANCE NO LONGER COVERS ACCUCHEK. Einar Pheasant, MD Taking Active   potassium chloride (KLOR-CON) 10 MEQ tablet [id number] Yes TAKE 1 TABLET(10 MEQ) BY MOUTH TWICE DAILY Einar Pheasant, MD Taking Active   rosuvastatin (CRESTOR) 10 MG tablet [id number] Yes TAKE 1 TABLET(10 MG) BY MOUTH DAILY Einar Pheasant, MD Taking Active   Semaglutide,0.25 or 0.5MG /DOS, (OZEMPIC, 0.25 OR 0.5 MG/DOSE,) 2 MG/1.5ML SOPN [id number] Yes Inject 0.5 mg weekly Einar Pheasant, MD Taking Active            Med Note Mayo Ao Sep 30, 2020  3:34 PM) 0.25 mg weekly  triamterene-hydrochlorothiazide (MAXZIDE-25) 37.5-25 MG tablet [id number] Yes TAKE 1/2 TABLET BY MOUTH DAILY Einar Pheasant, MD Taking Active             Patient Active Problem List   Diagnosis Date Noted   Rib pain on left side 08/05/2020   Sleep difficulties 06/08/2020   Swelling of lower extremity 11/26/2019   Healthcare maintenance 09/13/2019   Sinus congestion 02/11/2019   B12 deficiency 02/11/2019   Asthma 12/28/2018   Adhesive capsulitis of left shoulder 12/12/2018   Vitamin D deficiency 11/01/2018   Tendinitis of upper biceps tendon of left shoulder 09/20/2018   Depression 09/15/2018   Mild intermittent asthma without complication 83/41/9622   Controlled type 2 diabetes mellitus with complication, with long-term current use of insulin ([place]) 09/15/2018   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   Traumatic incomplete tear of left rotator cuff 09/09/2018   Bleeding internal hemorrhoids 09/13/2017   Osteoporosis, 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 Anticipated assistance start date TBD. See plan of care below for additional detail.   Patient Care Plan: Medication Management    Problem Identified: Diabetes, Hypertension, Hyperlipidemia     Long-Range Goal: Disease Progression Prevention   Recent Progress: On track  Priority: High  Note:   Current Barriers:   Unable to independently afford treatment regimen  Pharmacist Clinical Goal(s):   Over the next 90 days, patient will verbalize ability to afford treatment regimen.  Over the next 90 days, patient will maintain control of diabetes as  evidenced by improvement in A1c through collaboration with PharmD and provider.   Interventions:  Inter-disciplinary care team collaboration (see longitudinal plan of care)  Comprehensive medication review performed; medication list updated in electronic medical record  Diabetes:  Controlled; current treatment: Ozempic 0.25 mg weekly (reduced from 0.5 mg d/t GI upset), Tresiba 22 units daily; receiving these medications from [employer] patient assistance. o Hx recurrent UTIs on [place],   [place] to follow up on Ozempic refill. Processed today, will ship to the office in 10-14 business days. Will call [facility] on or after 12/22 if we have not received the medication  Hypertension:  Controlled; current treatment: amlodipine 5 mg BID, losartan 100 mg QPM, triamterene/HCTZ 37.5/25 1/2 tab QAM, potassium 10 mEq BID; BP at goal on last clinic  Recommended to continue current regimen.   Hyperlipidemia:  Controlled; current treatment: rosuvastatin 10 mg daily  Recommended to continue current regimen  Asthma:  Controlled; current regimen: Symbicort 160/4.5 mcg 2 puffs BID. Denies need for rescue inhaler or nebulizer therapy in months  [place] previously. [place] application after 02/05/84  Sleep/Pain:  Uncontrolled; reports having to provide more physical care for her husband recently (s/p hospitalization d/t GI bleed, resulting in weakness per her report), notes she may have pulled something in her back. Improved w/ heating pad and cyclobenzaprine.   Appt w/ PCP next week to discuss these concerns  Patient Goals/Self-Care Activities  Over the next 90 days, patient will:  - take medications as prescribed check blood glucose periodically, document, and provide at future appointments check blood pressure periodically, document, and provide at future appointments collaborate with provider on medication access  solutions  Follow Up Plan: Telephone follow up appointment with care management team member scheduled for:~ 9 weeks      Plan: Telephone follow up appointment with care management team member scheduled for:~9 weeks as previously scheduled  [name] Darnelle Maffucci, PharmD, [place], [place] Pharmacist [place] [phone]

## 2020-10-02 NOTE — Patient Instructions (Signed)
Visit Information  Patient Care Plan: Medication Management    Problem Identified: Diabetes, Hypertension, Hyperlipidemia     Long-Range Goal: Disease Progression Prevention   Recent Progress: On track  Priority: High  Note:   Current Barriers:  . Unable to independently afford treatment regimen  Pharmacist Clinical Goal(s):  Marland Kitchen Over the next 90 days, patient will verbalize ability to afford treatment regimen. . Over the next 90 days, patient will maintain control of diabetes as evidenced by improvement in A1c through collaboration with PharmD and provider.   Interventions: . Inter-disciplinary care team collaboration (see longitudinal plan of care) . Comprehensive medication review performed; medication list updated in electronic medical record  Diabetes: . Controlled; current treatment: Ozempic 0.25 mg weekly (reduced from 0.5 mg d/t GI upset), Tresiba 22 units daily; receiving these medications from [employer] patient assistance. o Hx recurrent UTIs on [place],  . [place] to follow up on Ozempic refill. Processed today, will ship to the office in 10-14 business days. Will call Nov[employer] on or after 12/22 if we have not received the medication  Hypertension: . Controlled; current treatment: amlodipine 5 mg BID, losartan 100 mg QPM, triamterene/HCTZ 37.5/25 1/2 tab QAM, potassium 10 mEq BID; BP at goal on last clinic . Recommended to continue current regimen.   Hyperlipidemia: . Controlled; current treatment: rosuvastatin 10 mg daily . Recommended to continue current regimen  Asthma: . Controlled; current regimen: Symbicort 160/4.5 mcg 2 puffs BID. Denies need for rescue inhaler or nebulizer therapy in months . [place]. [place] after 05/06/15  Sleep/Pain: . Uncontrolled; reports having to provide more physical care for her husband recently (s/p hospitalization d/t GI bleed, resulting in weakness per  her report), notes she may have pulled something in her back. Improved w/ heating pad and cyclobenzaprine.  Marland Kitchen Appt w/ PCP next week to discuss these concerns  Patient Goals/Self-Care Activities . Over the next 90 days, patient will:  - take medications as prescribed check blood glucose periodically, document, and provide at future appointments check blood pressure periodically, document, and provide at future appointments collaborate with provider on medication access solutions  Follow Up Plan: Telephone follow up appointment with care management team member scheduled for:~ 9 weeks     The patient verbalized understanding of instructions, educational materials, and care plan provided today and declined offer to receive copy of patient instructions, educational materials, and care plan.   Plan: Telephone follow up appointment with care management team member scheduled for:~9 weeks as previously scheduled  [name] Darnelle Maffucci, PharmD, [place], [place] Pharmacist [place] [phone]

## 2020-10-02 NOTE — Telephone Encounter (Signed)
Pt would like a call back to discuss b12 injections and she also would like something called into the pharmacy for sleep. She states that she has not been able to get sleep in days. Please advise

## 2020-10-10 ENCOUNTER — Telehealth: Payer: Self-pay | Admitting: Internal Medicine

## 2020-10-11 ENCOUNTER — Emergency Department: Payer: PPO

## 2020-10-11 ENCOUNTER — Emergency Department
Admission: EM | Admit: 2020-10-11 | Discharge: 2020-10-11 | Disposition: A | Discharge status: Home / Self Care | Payer: PPO | Attending: Emergency Medicine | Admitting: Emergency Medicine

## 2020-10-11 ENCOUNTER — Other Ambulatory Visit: Payer: Self-pay

## 2020-10-11 ENCOUNTER — Encounter: Payer: Self-pay | Admitting: Emergency Medicine

## 2020-10-11 DIAGNOSIS — S42295A Other nondisplaced fracture of upper end of left humerus, initial encounter for closed fracture: Secondary | ICD-10-CM | POA: Insufficient documentation

## 2020-10-11 DIAGNOSIS — Z79899 Other long term (current) drug therapy: Secondary | ICD-10-CM | POA: Insufficient documentation

## 2020-10-11 DIAGNOSIS — J4520 Mild intermittent asthma, uncomplicated: Secondary | ICD-10-CM | POA: Insufficient documentation

## 2020-10-11 DIAGNOSIS — Z794 Long term (current) use of insulin: Secondary | ICD-10-CM | POA: Diagnosis not present

## 2020-10-11 DIAGNOSIS — S4992XA Unspecified injury of left shoulder and upper arm, initial encounter: Secondary | ICD-10-CM | POA: Diagnosis present

## 2020-10-11 DIAGNOSIS — S62324A Displaced fracture of shaft of fourth metacarpal bone, right hand, initial encounter for closed fracture: Secondary | ICD-10-CM | POA: Diagnosis not present

## 2020-10-11 DIAGNOSIS — S62354A Nondisplaced fracture of shaft of fourth metacarpal bone, right hand, initial encounter for closed fracture: Secondary | ICD-10-CM | POA: Insufficient documentation

## 2020-10-11 DIAGNOSIS — Z9889 Other specified postprocedural states: Secondary | ICD-10-CM | POA: Diagnosis not present

## 2020-10-11 DIAGNOSIS — Y92007 Garden or yard of unspecified non-institutional (private) residence as the place of occurrence of the external cause: Secondary | ICD-10-CM | POA: Diagnosis not present

## 2020-10-11 DIAGNOSIS — S42252A Displaced fracture of greater tuberosity of left humerus, initial encounter for closed fracture: Secondary | ICD-10-CM | POA: Diagnosis not present

## 2020-10-11 DIAGNOSIS — K219 Gastro-esophageal reflux disease without esophagitis: Secondary | ICD-10-CM | POA: Diagnosis not present

## 2020-10-11 DIAGNOSIS — M19012 Primary osteoarthritis, left shoulder: Secondary | ICD-10-CM | POA: Diagnosis not present

## 2020-10-11 DIAGNOSIS — W010XXA Fall on same level from slipping, tripping and stumbling without subsequent striking against object, initial encounter: Secondary | ICD-10-CM | POA: Insufficient documentation

## 2020-10-11 DIAGNOSIS — E119 Type 2 diabetes mellitus without complications: Secondary | ICD-10-CM | POA: Diagnosis not present

## 2020-10-11 DIAGNOSIS — W19XXXA Unspecified fall, initial encounter: Secondary | ICD-10-CM

## 2020-10-11 MED ORDER — HYDROCODONE-ACETAMINOPHEN 5-325 MG PO TABS: 1.0000 | ORAL_TABLET | ORAL | 0 refills | Status: DC | PRN

## 2020-10-11 MED ORDER — HYDROCODONE-ACETAMINOPHEN 5-325 MG PO TABS
1.0000 | ORAL_TABLET | ORAL | Status: AC
  Administered 2020-10-11: 1 via ORAL
  Filled 2020-10-11: qty 1

## 2020-10-11 NOTE — ED Provider Notes (Signed)
[facility] EMERGENCY DEPARTMENT Provider Note   CSN: [id number] Arrival date & time: 10/11/20  1657     History Chief Complaint  Patient presents with  . Fall  . Shoulder Pain    Ashley Gross is a 69 y.o. female presents to the emergency department for evaluation of a fall that occurred just over 4 PM today.  Patient states she tripped, no dizziness or lightheadedness.  She was going to put her Christmas tree up when she tripped over the tree, landing onto her left shoulder and injuring her right hand.  She complains of severe left shoulder pain and moderate right hand pain.  She denies any head injury, neck pain, nausea, vomiting, chest pain or shortness of breath.  No lower back or lower extremity discomfort.  Pain is located on the left proximal humerus and the ulnar aspect of the right hand.  No numbness or tingling in the upper or lower extremities.  She has not had a medications for pain.   Patient with prior rotator cuff repair by Dr. Dorien Chihuahua HPI     Past Medical History:  Diagnosis Date  . Abnormal liver function test 10/06/2012  . Anemia, unspecified   . Asthma   . Colitis   . Complication of anesthesia    asthma attack after shoulder surgery  . Diabetes mellitus ([place])   . Esophagitis   . H/O hiatal hernia   . Heart murmur   . Hx of arteriovenous malformation (AVM)   . Hypertension   . IBS (irritable bowel syndrome)   . Nephrolithiasis   . Pernicious anemia   . Personal history of colonic polyps 02/10/2013   02/08/13 colonoscopy - question of rectal varices, two 3-57mm polyps in the sigmoid colon, mass at the hepatic flexure, one 28mm polyp at the hepatic flexure, nodule at the ileocecal valve, congested mucusa in the entire colon, flattened villi mucosa in the terminal ileum.    . Pneumonia   . Psoriatic arthritis (H[facility])    s/p penicillamine, plaquenil, MTX, sulfasalazine.  s/p Embrel, Humira,.  Iritis.    . Pure hypercholesterolemia      Patient Active Problem List   Diagnosis Date Noted  . Rib pain on left side 08/05/2020  . Sleep difficulties 06/08/2020  . Swelling of lower extremity 11/26/2019  . Healthcare maintenance 09/13/2019  . Sinus congestion 02/11/2019  . B12 deficiency 02/11/2019  . Asthma 12/28/2018  . 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 Vitamin D deficiency 11/01/2018  . Tendinitis of upper biceps tendon of left shoulder 09/20/2018  . Depression 09/15/2018  . Mild intermittent asthma without complication 40/06/6760  . Controlled type 2 diabetes mellitus with complication, with long-term current use of insulin ([place]) 09/15/2018  . 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 Traumatic incomplete tear of left rotator cuff 09/09/2018  . Bleeding internal hemorrhoids 09/13/2017  . 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 Rectal bleeding 07/15/2017  . Low back pain 03/29/2016  . Stress 12/12/2014  . Congestion of throat 01/06/2014  . Chronic diarrhea 02/16/2013  . Inflammatory polyps of colon ([place]) 02/16/2013  . Hypertension 10/06/2012  . GERD (gastroesophageal reflux disease) 10/06/2012  . Hypercholesterolemia 10/06/2012  . Psoriatic arthritis ([place]) 06/29/2012    Past Surgical History:  Procedure Laterality Date  . ABDOMINAL HYSTERECTOMY  1981   ovaries left in place  . ANKLE SURGERY     right  . BUNIONECTOMY    . CHOLECYSTECTOMY  1989  . COLONOSCOPY WITH PROPOFOL N/A 12/27/2017   Procedure: COLONOSCOPY WITH PROPOFOL;  Surgeon: Lin Landsman, MD;  Location[facility] ENDOSCOPY;  Service: Gastroenterology;  Laterality: N/A;  . COLONOSCOPY WITH PROPOFOL N/A 01/04/2019   Procedure: COLONOSCOPY WITH PROPOFOL;  Surgeon: Lin Landsman, MD;  Location: [place];  Service: Endoscopy;  Laterality: N/A;  Diabetic - oral and injectable  . ESOPHAGOGASTRODUODENOSCOPY (EGD) WITH PROPOFOL N/A 12/27/2017   Procedure:  ESOPHAGOGASTRODUODENOSCOPY (EGD) WITH PROPOFOL;  Surgeon: Lin Landsman, MD;  Location: [facility];  Service: Gastroenterology;  Laterality: N/A;  . HAND SURGERY     right  . HEEL SPUR SURGERY    . NOSE SURGERY     x2  . POLYPECTOMY  01/04/2019   Procedure: POLYPECTOMY;  Surgeon: Lin Landsman, MD;  Location: [facility];  Service: Endoscopy;;  . SHOULDER ARTHROSCOPY WITH OPEN ROTATOR CUFF REPAIR Left 09/20/2018   Procedure: SHOULDER ARTHROSCOPY WITH OPEN ROTATOR CUFF REPAIR;  Surgeon: Corky Mull, MD;  Location: [facility];  Service: Orthopedics;  Laterality: Left;  . SHOULDER CLOSED REDUCTION Left 12/21/2018   Procedure: MANIPULATION UNDER ANESTHESIA WITH STEROID INJECTION;  Surgeon: Corky Mull, MD;  Location: [place];  Service: Orthopedics;  Laterality: Left;  Diabetic - insulin and oral meds  . UMBILICAL HERNIA REPAIR       OB History   No obstetric history on file.     Family History  Problem Relation Age of Onset  . Diabetes Other   . Hypertension Other   . Breast cancer Neg Hx   . Colon cancer Neg Hx     Social History   Tobacco Use  . Smoking status: Never Smoker  . Smokeless tobacco: Never Used  Vaping Use  . Vaping Use: Never used  Substance Use Topics  . Alcohol use: No    Alcohol/week: 0.0 standard drinks  . Drug use: No    Home Medications Prior to Admission medications   Medication Sig Start Date End Date Taking? Authorizing Provider  albuterol (ACCUNEB) 0.63 MG/3ML nebulizer solution Take 3 mLs (0.63 mg total) by nebulization every 6 (six) hours as needed for wheezing. Patient not taking: Reported on 09/30/2020 01/11/18   Delano Metz, FNP  amLODipine (NORVASC) 5 MG tablet TAKE 1 TABLET(5 MG) BY MOUTH TWICE DAILY 09/02/20   Einar Pheasant, MD  budesonide-formoterol [facility]) 160-4.5 MCG/ACT inhaler Inhale 2 puffs into the lungs 2 (two) times daily. 01/04/20   Einar Pheasant, MD  Calcium Carbonate-Vit D-Min  (CALCIUM 600+D PLUS MINERALS) 600-400 MG-UNIT TABS Take by mouth.    [provider]  cetirizine (ZYRTEC) 10 MG tablet Take 10 mg by mouth daily. Patient not taking: Reported on 09/30/2020    [provider]  Cholecalciferol (VITAMIN D-3) 1000 UNITS CAPS Take 1 capsule by mouth daily.    [provider]  cyclobenzaprine (FLEXERIL) 10 MG tablet TAKE 1 TABLET BY MOUTH EVERY NIGHT AS NEEDED 09/30/20   Einar Pheasant, MD  diphenhydrAMINE (BENADRYL) 25 mg capsule Take by mouth. Patient not taking: Reported on 09/30/2020    [provider]  HYDROcodone-acetaminophen (NORCO) 5-325 MG tablet Take 1 tablet by mouth every 4 (four) hours as needed for moderate pain. 10/11/20   Duanne Guess, PA-C  insulin degludec (TRESIBA FLEXTOUCH) 100 UNIT/ML FlexTouch Pen Inject 0.22 mLs (22 Units total) into the skin daily. 04/25/20   Einar Pheasant, MD  Insulin Pen Needle (BD PEN NEEDLE NANO U/F) 32G X 4 MM MISC Use  to inject insulin daily 12/04/19   Einar Pheasant, MD  ipratropium (ATROVENT HFA) 17 MCG/ACT inhaler Inhale 2 puffs into the lungs every 6 (six) hours. Patient not taking: Reported on 09/30/2020    [provider]  loperamide (IMODIUM) 2 MG capsule Take 2 mg by mouth as needed.     [provider]  losartan (COZAAR) 100 MG tablet TAKE 1 TABLET BY MOUTH EVERY DAY 09/02/20   Einar Pheasant, MD  omeprazole (PRILOSEC) 20 MG capsule TAKE 1 CAPSULE(20 MG) BY MOUTH TWICE DAILY 09/30/20   Einar Pheasant, MD  ONE TOUCH ULTRA TEST test strip PATIENT NEEDS NEW METER STRIPS AND LANCETS FOR ONE TOUCH. HER INSURANCE NO LONGER COVERS ACCUCHEK. 07/31/15   Einar Pheasant, MD  potassium chloride (KLOR-CON) 10 MEQ tablet TAKE 1 TABLET(10 MEQ) BY MOUTH TWICE DAILY 03/21/20   Einar Pheasant, MD  rosuvastatin (CRESTOR) 10 MG tablet TAKE 1 TABLET(10 MG) BY MOUTH DAILY 09/02/20   Einar Pheasant, MD  Semaglutide,0.25 or 0.5MG /DOS, (OZEMPIC, 0.25 OR 0.5 MG/DOSE,) 2 MG/1.5ML  SOPN Inject 0.5 mg weekly 04/09/20   Einar Pheasant, MD  triamterene-hydrochlorothiazide (MAXZIDE-25) 37.5-25 MG tablet TAKE 1/2 TABLET BY MOUTH DAILY 06/03/20   Einar Pheasant, MD    Allergies    Aspirin, Beta adrenergic blockers, Lisinopril, Mtx support [cobalamine combinations], Vasotec [enalapril], Verapamil, Voltaren [diclofenac sodium], Erythromycin, Indocin [indomethacin], and Jardiance [empagliflozin]  Review of Systems   Review of Systems  Constitutional: Negative for fever.  Respiratory: Negative for shortness of breath.   Cardiovascular: Negative for chest pain.  Gastrointestinal: Negative for nausea and vomiting.  Genitourinary: Negative for flank pain.  Musculoskeletal: Positive for arthralgias. Negative for back pain, gait problem, joint swelling, neck pain and neck stiffness.  Skin: Negative for color change, pallor, rash and wound.  Neurological: Negative for dizziness, light-headedness and headaches.    Physical Exam Updated Vital Signs BP 136/73 (BP Location: Left Arm)   Pulse 79   Temp 98.4 F (36.9 C) (Oral)   Resp 16   Ht 5\' 4"  (1.626 m)   Wt 69.4 kg   SpO2 99%   BMI 26.26 kg/m   Physical Exam Constitutional:      Appearance: She is well-developed and well-nourished.  HENT:     Head: Normocephalic and atraumatic.  Eyes:     Conjunctiva/sclera: Conjunctivae normal.  Cardiovascular:     Rate and Rhythm: Normal rate.  Pulmonary:     Effort: Pulmonary effort is normal. No respiratory distress.  Musculoskeletal:        General: Normal range of motion.     Cervical back: Normal range of motion.     Comments: Examination of the left shoulder shows tenderness to the proximal humerus.  No swelling warmth erythema or edema.  No skin right now noted left upper extremity.  She is nontender along the clavicles.  Nontender along the cervical thoracic or lumbar spine.  Able to logroll and move both lower extremities with no discomfort.  She has some tenderness to the  fourth and fifth metacarpal of the right hand, no deformity or angulation or rotation of the digits.  No tendon deficits.  Skin:    General: Skin is warm.     Findings: No rash.  Neurological:     Mental Status: She is alert and oriented to person, place, and time.  Psychiatric:        Mood and Affect: Mood and affect normal.        Behavior: Behavior normal.  Thought Content: Thought content normal.     ED Results / Procedures / Treatments   Labs (all labs ordered are listed, but only abnormal results are displayed) Labs Reviewed - No data to display  EKG None  Radiology DG Shoulder Left  Result Date: 10/11/2020 CLINICAL DATA:  Fall EXAM: LEFT SHOULDER - 2+ VIEW COMPARISON:  None. FINDINGS: There is a left humeral neck fracture. Fracture also extends toward the greater tuberosity. No subluxation or dislocation. Degenerative changes in the left AC joint. IMPRESSION: Left humeral neck fracture, nondisplaced. Minimally displaced fracture extends toward the greater tuberosity. Electronically Signed   By: Rolm Baptise M.D.   On: 10/11/2020 19:00   DG Hand Complete Right  Result Date: 10/11/2020 CLINICAL DATA:  Fall EXAM: RIGHT HAND - COMPLETE 3+ VIEW COMPARISON:  None. FINDINGS: There is a fracture through the midshaft of the 4th metacarpal. There appears to be volar subluxation of the MCP joints diffusely, likely related to arthritic change and chronic. No dislocation seen. Degenerative changes throughout the IP joints, MCP joints and 1st carpometacarpal joint. IMPRESSION: Oblique nondisplaced fracture through the midshaft of the right 4th metacarpal. Electronically Signed   By: Rolm Baptise M.D.   On: 10/11/2020 19:05    Procedures .Ortho Injury Treatment  Date/Time: 10/11/2020 8:26 PM Performed by: Duanne Guess, PA-C Authorized by: Duanne Guess, PA-C   Consent:    Consent obtained:  Verbal   Consent given by:  PatientInjury location: hand Location details:  right hand Injury type: fracture Fracture type: fourth metacarpal Pre-procedure distal perfusion: normal Pre-procedure neurological function: normal Pre-procedure range of motion: normal Immobilization: splint Splint type: ulnar gutter Supplies used: cotton padding,  elastic bandage and Ortho-Glass Post-procedure neurovascular assessment: post-procedure neurovascularly intact Post-procedure distal perfusion: normal Post-procedure neurological function: normal Post-procedure range of motion: normal    (including critical care time)  Medications Ordered in ED Medications  HYDROcodone-acetaminophen (NORCO/VICODIN) 5-325 MG per tablet 1 tablet (1 tablet Oral Given 10/11/20 2020)    ED Course  I have reviewed the triage vital signs and the nursing notes.  Pertinent labs & imaging results that were available during my care of the patient were reviewed by me and considered in my medical decision making (see chart for details).    MDM Rules/Calculators/A&P                          69 year old female with a fall earlier today.  She landed on her left shoulder.  She also complains of some right hand pain.  X-rays of the left shoulder show nondisplaced proximal humerus fracture.  X-rays of the right hand show a nondisplaced fourth metacarpal shaft fracture.  She denies any other pain or injury throughout her body.  Physical exam indicates no other injury throughout the body.  Her vital signs are stable.  Patient placed into a right hand ulnar gutter splint, left upper extremity sling.  She is given Norco for pain.  She will follow-up with her orthopedic surgeon Dr. Daphene Calamity G who has performed previous rotator cuff repair on her. Final Clinical Impression(s) / ED Diagnoses Final diagnoses:  Fall, initial encounter  Other closed nondisplaced fracture of proximal end of left humerus, initial encounter  Closed nondisplaced fracture of shaft of fourth metacarpal bone of right hand, initial encounter     Rx / DC Orders ED Discharge Orders         Ordered    HYDROcodone-acetaminophen (NORCO) 5-325 MG tablet  Every 4 hours PRN        10/11/20 2021           Renata Caprice 10/11/20 2028    Arta Silence, MD 10/11/20 2320

## 2020-10-11 NOTE — Discharge Instructions (Addendum)
Please take Norco as needed for moderate to severe pain.  You may use Tylenol for mild pain.  You may apply ice to the left shoulder as needed for discomfort, 20 minutes every hour.  Call orthopedic office Monday morning to schedule follow-up appointment.  Please let them know you have suffered a left proximal humerus fracture and a right fourth metacarpal fracture

## 2020-10-11 NOTE — ED Triage Notes (Signed)
Pt to ED via POV, pt states that she was getting her Christmas tree out and fell in her front yard. Pt reports landing on her left shoulder and jamming her right hand. Pt states that she did not hit her head. Pt is in NAD.

## 2020-10-11 NOTE — ED Notes (Signed)
PT fell with LUE injury, R hand 4th and 5th finger injury. PT unable to move LUE. PT can move fingers and hand LUE.

## 2020-10-14 ENCOUNTER — Ambulatory Visit: Payer: PPO | Admitting: Internal Medicine

## 2020-10-15 DIAGNOSIS — S42202A Unspecified fracture of upper end of left humerus, initial encounter for closed fracture: Secondary | ICD-10-CM | POA: Diagnosis not present

## 2020-10-15 DIAGNOSIS — S62354A Nondisplaced fracture of shaft of fourth metacarpal bone, right hand, initial encounter for closed fracture: Secondary | ICD-10-CM | POA: Diagnosis not present

## 2020-10-16 ENCOUNTER — Telehealth: Payer: Self-pay

## 2020-10-16 NOTE — Telephone Encounter (Signed)
Received two boxes of ozempic- patient assistance meds. Labeled and placed in sample refrigerator. Patient is aware and will pick up tomorrow.

## 2020-10-31 DIAGNOSIS — S42202A Unspecified fracture of upper end of left humerus, initial encounter for closed fracture: Secondary | ICD-10-CM | POA: Diagnosis not present

## 2020-10-31 DIAGNOSIS — S62354A Nondisplaced fracture of shaft of fourth metacarpal bone, right hand, initial encounter for closed fracture: Secondary | ICD-10-CM | POA: Diagnosis not present

## 2020-11-08 ENCOUNTER — Ambulatory Visit: Payer: PPO | Admitting: Pharmacist

## 2020-11-08 DIAGNOSIS — Z794 Long term (current) use of insulin: Secondary | ICD-10-CM

## 2020-11-08 DIAGNOSIS — I10 Essential (primary) hypertension: Secondary | ICD-10-CM

## 2020-11-08 DIAGNOSIS — E1165 Type 2 diabetes mellitus with hyperglycemia: Secondary | ICD-10-CM

## 2020-11-08 DIAGNOSIS — E7800 Pure hypercholesterolemia, unspecified: Secondary | ICD-10-CM

## 2020-11-08 DIAGNOSIS — R053 Chronic cough: Secondary | ICD-10-CM

## 2020-11-08 MED ORDER — BUDESONIDE-FORMOTEROL FUMARATE 160-4.5 MCG/ACT IN AERO: 2.0000 | INHALATION_SPRAY | Freq: Two times a day (BID) | RESPIRATORY_TRACT | 3 refills | Status: DC

## 2020-11-08 NOTE — Chronic Care Management (AMB) (Signed)
Chronic Care Management   Pharmacy Note  11/08/2020 Name: Ashley Gross MRN: [id number] DOB: 1950-12-26  Subjective:  Ashley Gross is a 70 y.o. year old female who is a primary care patient of Einar Pheasant, MD. The CCM team was consulted for assistance with chronic disease management and care coordination needs.    Collaboration with CPhT for medication access in response to provider referral for pharmacy case management and/or care coordination services.   Consent to Services:  Patient was given information about Chronic Care Management services, agreed to services, and gave verbal consent prior to initiation of services on 01/04/2020. Please see initial visit note for detailed documentation.   Objective:  Lab Results  Component Value Date   CREATININE 1.15 07/19/2020   CREATININE 1.06 03/29/2020   CREATININE 1.03 (H) 11/28/2019    Lab Results  Component Value Date   HGBA1C 6.2 07/19/2020       Component Value Date/Time   CHOL 108 07/19/2020 0913   TRIG 110.0 07/19/2020 0913   HDL 48.60 07/19/2020 0913   CHOLHDL 2 07/19/2020 0913   VLDL 22.0 07/19/2020 0913   LDLCALC 38 07/19/2020 0913   LDLCALC 56 11/01/2018 1035   LDLDIRECT 74.0 04/19/2019 1056     BP Readings from Last 3 Encounters:  10/11/20 107/67  08/05/20 120/68  07/23/20 128/72    Assessment/Interventions: Review of patient past medical history, allergies, medications, health status, including review of consultants reports, laboratory and other test data, was performed as part of comprehensive evaluation and provision of chronic care management services.   SDOH (Social Determinants of Health) assessments and interventions performed:    CCM Care Plan  Allergies  Allergen Reactions  . Aspirin Other (See Comments)    Increased bleeding  . Beta Adrenergic Blockers   . Lisinopril     cough  . Mtx Support [Cobalamine Combinations] Other (See Comments)    Respiratory symptoms  . Vasotec  [Enalapril] Cough  . Verapamil Other (See Comments)    Heart block  . Voltaren [Diclofenac Sodium]   . Erythromycin Other (See Comments)    Gi intolerance  . Indocin [Indomethacin]   . Jardiance [Empagliflozin] Other (See Comments)    Recurrent yeast infections, even with washout and rechallenge    Medications Reviewed Today    Reviewed by De Hollingshead, RPH-CPP (Pharmacist) on 09/30/20 at 1541  Med List Status: <None>  Medication Order Taking? Sig Documenting Provider Last Dose Status Informant  albuterol (ACCUNEB) 0.63 MG/3ML nebulizer solution [id number] No Take 3 mLs (0.63 mg total) by nebulization every 6 (six) hours as needed for wheezing.  Patient not taking: Reported on 09/30/2020   Delano Metz, [place] Not Taking Active            Med Note [facility], Willey Blade   Wed Dec 21, 2018  9:38 AM) PRN, last used several months ago  amLODipine (NORVASC) 5 MG tablet [id number] Yes TAKE 1 TABLET(5 MG) BY MOUTH TWICE DAILY Einar Pheasant, MD Taking Active   budesonide-formoterol [facility]) 160-4.5 MCG/ACT inhaler [id number] Yes Inhale 2 puffs into the lungs 2 (two) times daily. Einar Pheasant, MD Taking Active   Calcium Carbonate-Vit D-Min (CALCIUM 600+D PLUS MINERALS) 600-400 MG-UNIT TABS [id number] Yes Take by mouth. [provider] Taking Active   cetirizine (ZYRTEC) 10 MG tablet [id number] Yes Take 10 mg by mouth daily. [provider] Taking Active   Cholecalciferol (VITAMIN D-3) 1000 UNITS CAPS [id number] Yes Take 1 capsule by mouth daily. [provider] Taking  Active   cyclobenzaprine (FLEXERIL) 10 MG tablet [id number] Yes TAKE 1 TABLET BY MOUTH EVERY NIGHT AS NEEDED Einar Pheasant, MD Taking Active   diphenhydrAMINE (BENADRYL) 25 mg capsule [id number] No Take by mouth.  Patient not taking: Reported on 09/30/2020   [provider] Not Taking Active            Med Note Darnelle Maffucci, Maralyn Sago Jan 04, 2020 11:12 AM)    insulin degludec (TRESIBA  FLEXTOUCH) 100 UNIT/ML FlexTouch Pen [id number] Yes Inject 0.22 mLs (22 Units total) into the skin daily. Einar Pheasant, MD Taking Active   Insulin Pen Needle (BD PEN NEEDLE NANO U/F) 32G X 4 MM MISC [id number] Yes Use to inject insulin daily Einar Pheasant, MD Taking Active   ipratropium (ATROVENT HFA) 17 MCG/ACT inhaler [id number] No Inhale 2 puffs into the lungs every 6 (six) hours.  Patient not taking: Reported on 09/30/2020   [provider] Not Taking Active            Med Note De Hollingshead   Thu Jan 04, 2020 11:12 AM) Using PRN  loperamide (IMODIUM) 2 MG capsule [id number] Yes Take 2 mg by mouth as needed.  [provider] Taking Active            Med Note Nat Christen Jan 04, 2020 11:14 AM) Taking 1 QAM  losartan (COZAAR) 100 MG tablet [id number] Yes TAKE 1 TABLET BY MOUTH EVERY DAY Einar Pheasant, MD Taking Active   omeprazole (PRILOSEC) 20 MG capsule [id number] Yes TAKE 1 CAPSULE(20 MG) BY MOUTH TWICE DAILY Einar Pheasant, MD Taking Active   ONE TOUCH ULTRA TEST test strip [id number] Yes PATIENT NEEDS NEW METER STRIPS AND LANCETS FOR ONE TOUCH. HER INSURANCE NO LONGER COVERS ACCUCHEK. Einar Pheasant, MD Taking Active   potassium chloride (KLOR-CON) 10 MEQ tablet [id number] Yes TAKE 1 TABLET(10 MEQ) BY MOUTH TWICE DAILY Einar Pheasant, MD Taking Active   rosuvastatin (CRESTOR) 10 MG tablet [id number] Yes TAKE 1 TABLET(10 MG) BY MOUTH DAILY Einar Pheasant, MD Taking Active   Semaglutide,0.25 or 0.5MG /DOS, (OZEMPIC, 0.25 OR 0.5 MG/DOSE,) 2 MG/1.5ML SOPN [id number] Yes Inject 0.5 mg weekly Einar Pheasant, MD Taking Active            Med Note Mayo Ao Sep 30, 2020  3:34 PM) 0.25 mg weekly  triamterene-hydrochlorothiazide (MAXZIDE-25) 37.5-25 MG tablet [id number] Yes TAKE 1/2 TABLET BY MOUTH DAILY Einar Pheasant, MD Taking Active           Patient Active Problem List   Diagnosis Date Noted  . Rib pain on left side 08/05/2020  .  Sleep difficulties 06/08/2020  . Swelling of lower extremity 11/26/2019  . Healthcare maintenance 09/13/2019  . Sinus congestion 02/11/2019  . B12 deficiency 02/11/2019  . Asthma 12/28/2018  . 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 Vitamin D deficiency 11/01/2018  . Tendinitis of upper biceps tendon of left shoulder 09/20/2018  . Depression 09/15/2018  . Mild intermittent asthma without complication [id number]  . Controlled type 2 diabetes mellitus with complication, with long-term current use of insulin ([place]) 09/15/2018  . 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 Traumatic incomplete tear of left rotator cuff 09/09/2018  . Bleeding internal hemorrhoids 09/13/2017  . 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 Rectal bleeding 07/15/2017  . Low back pain 03/29/2016  . Stress 12/12/2014  . Congestion of throat 01/06/2014  . Chronic  diarrhea 02/16/2013  . Inflammatory polyps of colon ([place]) 02/16/2013  . Hypertension 10/06/2012  . GERD (gastroesophageal reflux disease) 10/06/2012  . Hypercholesterolemia 10/06/2012  . Psoriatic arthritis ([place]) 06/29/2012    Conditions to be addressed/monitored: HTN, HLD and DM  Patient Care Plan: Medication Management    Problem Identified: Diabetes, Hypertension, Hyperlipidemia     Long-Range Goal: Disease Progression Prevention   Recent Progress: On track  Priority: High  Note:   Current Barriers:  . Unable to independently afford treatment regimen  Pharmacist Clinical Goal(s):  Marland Kitchen Over the next 90 days, patient will verbalize ability to afford treatment regimen. . Over the next 90 days, patient will maintain control of diabetes as evidenced by improvement in A1c through collaboration with PharmD and provider.   Interventions: . Inter-disciplinary care team collaboration (see longitudinal plan of care) . Comprehensive medication review performed; medication list updated in  electronic medical record  Diabetes: . Controlled; current treatment: Ozempic 0.25 mg weekly (reduced from 0.5 mg d/t GI upset), Tresiba 22 units daily; receiving these medications from [employer] patient assistance. o Hx recurrent UTIs on [place],  . Pending submission of 5003 reapplication for [employer]. Have not received patient portion of application back. LVM for patient to get these back to me/CPhT at her convenience. Will have additional copies of paperwork for patient to sign at her upcoming appt w/ Dr. Nicki Reaper next wek  Hypertension: . Controlled; current treatment: amlodipine 5 mg BID, losartan 100 mg QPM, triamterene/HCTZ 37.5/25 1/2 tab QAM, potassium 10 mEq BID; BP at goal on last clinic . Recommended to continue current regimen.   Hyperlipidemia: . Controlled; current treatment: rosuvastatin 10 mg daily . Recommended to continue current regimen  Asthma: . Controlled; current regimen: Symbicort 160/4.5 mcg 2 puffs BID. Denies need for rescue inhaler or nebulizer therapy in months . Approved for Symbicort assistance through 11/01/21. Updated script required. Faxing in today.    Patient Goals/Self-Care Activities . Over the next 90 days, patient will:  - take medications as prescribed check blood glucose periodically, document, and provide at future appointments check blood pressure periodically, document, and provide at future appointments collaborate with provider on medication access solutions  Follow Up Plan: Telephone follow up appointment with care management team member scheduled for: ~ 4 weeks     Medication Assistance: Symbicort obtained through [place] medication assistance program through 11/01/21. Pending reapplication for Ozempic and [name] through [employer].  Plan: Telephone follow up appointment with care management team member scheduled for:~ 4 weeks as previously scheduled  [name] Darnelle Maffucci, PharmD, [place], [place] Pharmacist [employer]  at [employer] [phone]

## 2020-11-08 NOTE — Patient Instructions (Addendum)
Visit Information  Patient Care Plan: Medication Management    Problem Identified: Diabetes, Hypertension, Hyperlipidemia     Long-Range Goal: Disease Progression Prevention   Recent Progress: On track  Priority: High  Note:   Current Barriers:  . Unable to independently afford treatment regimen  Pharmacist Clinical Goal(s):  Marland Kitchen Over the next 90 days, patient will verbalize ability to afford treatment regimen. . Over the next 90 days, patient will maintain control of diabetes as evidenced by improvement in A1c through collaboration with PharmD and provider.   Interventions: . Inter-disciplinary care team collaboration (see longitudinal plan of care) . Comprehensive medication review performed; medication list updated in electronic medical record  Diabetes: . Controlled; current treatment: Ozempic 0.25 mg weekly (reduced from 0.5 mg d/t GI upset), Tresiba 22 units daily; receiving these medications from [employer] patient assistance. o Hx recurrent UTIs on [place],  . Pending submission of 3016 reapplication for [employer]. Have not received patient portion of application back. LVM for patient to get these back to me/CPhT at her convenience. Will have additional copies of paperwork for patient to sign at her upcoming appt w/ Dr. Nicki Reaper next wek  Hypertension: . Controlled; current treatment: amlodipine 5 mg BID, losartan 100 mg QPM, triamterene/HCTZ 37.5/25 1/2 tab QAM, potassium 10 mEq BID; BP at goal on last clinic . Recommended to continue current regimen.   Hyperlipidemia: . Controlled; current treatment: rosuvastatin 10 mg daily . Recommended to continue current regimen  Asthma: . Controlled; current regimen: Symbicort 160/4.5 mcg 2 puffs BID. Denies need for rescue inhaler or nebulizer therapy in months . Approved for Symbicort assistance through 11/01/21. Updated script required. Faxing in today.    Patient Goals/Self-Care Activities . Over the next 90 days, patient  will:  - take medications as prescribed check blood glucose periodically, document, and provide at future appointments check blood pressure periodically, document, and provide at future appointments collaborate with provider on medication access solutions  Follow Up Plan: Telephone follow up appointment with care management team member scheduled for: ~ 4 weeks     The patient verbalized understanding of instructions, educational materials, and care plan provided today and declined offer to receive copy of patient instructions, educational materials, and care plan.   Plan: Telephone follow up appointment with care management team member scheduled for:~ 4 weeks as previously scheduled  [name] Darnelle Maffucci, PharmD, [place], [place] Pharmacist [employer] at [employer] [phone]

## 2020-11-13 ENCOUNTER — Ambulatory Visit: Payer: PPO | Admitting: Pharmacist

## 2020-11-13 ENCOUNTER — Other Ambulatory Visit: Payer: Self-pay

## 2020-11-13 ENCOUNTER — Telehealth: Payer: PPO

## 2020-11-13 ENCOUNTER — Ambulatory Visit (INDEPENDENT_AMBULATORY_CARE_PROVIDER_SITE_OTHER): Payer: PPO | Admitting: Internal Medicine

## 2020-11-13 VITALS — BP 116/70 | HR 87 | Temp 98.6°F | Resp 16 | Ht 64.0 in | Wt 154.2 lb

## 2020-11-13 DIAGNOSIS — E7800 Pure hypercholesterolemia, unspecified: Secondary | ICD-10-CM | POA: Diagnosis not present

## 2020-11-13 DIAGNOSIS — E538 Deficiency of other specified B group vitamins: Secondary | ICD-10-CM | POA: Diagnosis not present

## 2020-11-13 DIAGNOSIS — I10 Essential (primary) hypertension: Secondary | ICD-10-CM

## 2020-11-13 DIAGNOSIS — F439 Reaction to severe stress, unspecified: Secondary | ICD-10-CM

## 2020-11-13 DIAGNOSIS — Z23 Encounter for immunization: Secondary | ICD-10-CM

## 2020-11-13 DIAGNOSIS — S42202D Unspecified fracture of upper end of left humerus, subsequent encounter for fracture with routine healing: Secondary | ICD-10-CM | POA: Diagnosis not present

## 2020-11-13 DIAGNOSIS — E118 Type 2 diabetes mellitus with unspecified complications: Secondary | ICD-10-CM

## 2020-11-13 DIAGNOSIS — L4050 Arthropathic psoriasis, unspecified: Secondary | ICD-10-CM

## 2020-11-13 DIAGNOSIS — Z794 Long term (current) use of insulin: Secondary | ICD-10-CM

## 2020-11-13 DIAGNOSIS — R053 Chronic cough: Secondary | ICD-10-CM | POA: Diagnosis not present

## 2020-11-13 DIAGNOSIS — K5140 Inflammatory polyps of colon without complications: Secondary | ICD-10-CM | POA: Diagnosis not present

## 2020-11-13 DIAGNOSIS — E1165 Type 2 diabetes mellitus with hyperglycemia: Secondary | ICD-10-CM

## 2020-11-13 DIAGNOSIS — K219 Gastro-esophageal reflux disease without esophagitis: Secondary | ICD-10-CM

## 2020-11-13 LAB — HEPATIC FUNCTION PANEL
ALT: 8 U/L (ref 0–35)
AST: 16 U/L (ref 0–37)
Albumin: 4 g/dL (ref 3.5–5.2)
Alkaline Phosphatase: 105 U/L (ref 39–117)
Bilirubin, Direct: 0.1 mg/dL (ref 0.0–0.3)
Total Bilirubin: 0.6 mg/dL (ref 0.2–1.2)
Total Protein: 6 g/dL (ref 6.0–8.3)

## 2020-11-13 LAB — HEMOGLOBIN A1C: Hgb A1c MFr Bld: 5.4 % (ref 4.6–6.5)

## 2020-11-13 LAB — BASIC METABOLIC PANEL
BUN: 12 mg/dL (ref 6–23)
CO2: 31 mEq/L (ref 19–32)
Calcium: 8.6 mg/dL (ref 8.4–10.5)
Chloride: 103 mEq/L (ref 96–112)
Creatinine, Ser: 0.97 mg/dL (ref 0.40–1.20)
GFR: 59.5 mL/min — ABNORMAL LOW (ref >60.00)
Glucose, Bld: 96 mg/dL (ref 70–99)
Potassium: 3.3 mEq/L — ABNORMAL LOW (ref 3.5–5.1)
Sodium: 140 mEq/L (ref 135–145)

## 2020-11-13 LAB — MICROALBUMIN / CREATININE URINE RATIO
Creatinine,U: 59.9 mg/dL
Microalb Creat Ratio: 3.4 mg/g (ref 0.0–30.0)
Microalb, Ur: 2 mg/dL — ABNORMAL HIGH (ref 0.0–1.9)

## 2020-11-13 LAB — LIPID PANEL
Cholesterol: 114 mg/dL (ref 0–200)
HDL: 53.7 mg/dL (ref >39.00)
LDL Cholesterol: 38 mg/dL (ref 0–99)
NonHDL: 59.95
Total CHOL/HDL Ratio: 2
Triglycerides: 108 mg/dL (ref 0.0–149.0)
VLDL: 21.6 mg/dL (ref 0.0–40.0)

## 2020-11-13 LAB — TSH: TSH: 1.83 u[IU]/mL (ref 0.35–4.50)

## 2020-11-13 LAB — B12 AND FOLATE PANEL
Folate: 7.2 ng/mL (ref >5.9)
Vitamin B-12: 400 pg/mL (ref 211–911)

## 2020-11-13 MED ORDER — BUDESONIDE-FORMOTEROL FUMARATE 160-4.5 MCG/ACT IN AERO: 2.0000 | INHALATION_SPRAY | Freq: Two times a day (BID) | RESPIRATORY_TRACT | 3 refills | Status: DC

## 2020-11-13 NOTE — Progress Notes (Signed)
Patient ID: Ashley Gross, female   DOB: 10/21/1951, 70 y.o.   MRN: [id number]   Subjective:    Patient ID: Ashley Gross, female    DOB: 1951/01/23, 70 y.o.   MRN: [id number]  HPI This visit occurred during the SARS-CoV-2 public health emergency.  Safety protocols were in place, including screening questions prior to the visit, additional usage of staff PPE, and extensive cleaning of exam room while observing appropriate contact time as indicated for disinfecting solutions.  Patient here for a scheduled follow up.  Here to follow up regarding her blood sugar, blood pressure and cholesterol.  Increased stress with her husband's medical issues. Discussed today.  Also recent fall - while getting her Christmas tree in.  S/p humerus fracture and fourth metatarsal fracture.  Seeing ortho. Has f/u planned 11/18/20.  PT to start 11/25/20.  Breathing doing well. No increased cough or congestion.  No sob.  Bowels better.  AM sugars - low 100s.  Trying to watch diet.     Past Medical History:  Diagnosis Date  . Abnormal liver function test 10/06/2012  . Anemia, unspecified   . Asthma   . Colitis   . Complication of anesthesia    asthma attack after shoulder surgery  . Diabetes mellitus ([place])   . Esophagitis   . H/O hiatal hernia   . Heart murmur   . Hx of arteriovenous malformation (AVM)   . Hypertension   . IBS (irritable bowel syndrome)   . Nephrolithiasis   . Pernicious anemia   . Personal history of colonic polyps 02/10/2013   02/08/13 colonoscopy - question of rectal varices, two 3-48mm polyps in the sigmoid colon, mass at the hepatic flexure, one 69mm polyp at the hepatic flexure, nodule at the ileocecal valve, congested mucusa in the entire colon, flattened villi mucosa in the terminal ileum.    . Pneumonia   . Psoriatic arthritis (HCC)    s/p penicillamine, plaquenil, MTX, sulfasalazine.  s/p Embrel, Humira,.  Iritis.    . Pure hypercholesterolemia    Past Surgical History:  Procedure  Laterality Date  . ABDOMINAL HYSTERECTOMY  1981   ovaries left in place  . ANKLE SURGERY     right  . BUNIONECTOMY    . CHOLECYSTECTOMY  1989  . COLONOSCOPY WITH PROPOFOL N/A 12/27/2017   Procedure: COLONOSCOPY WITH PROPOFOL;  Surgeon: Lin Landsman, MD;  Location: [facility] ENDOSCOPY;  Service: Gastroenterology;  Laterality: N/A;  . COLONOSCOPY WITH PROPOFOL N/A 01/04/2019   Procedure: COLONOSCOPY WITH PROPOFOL;  Surgeon: Lin Landsman, MD;  Location: [place];  Service: Endoscopy;  Laterality: N/A;  Diabetic - oral and injectable  . ESOPHAGOGASTRODUODENOSCOPY (EGD) WITH PROPOFOL N/A 12/27/2017   Procedure: ESOPHAGOGASTRODUODENOSCOPY (EGD) WITH PROPOFOL;  Surgeon: Lin Landsman, MD;  Location: [place];  Service: Gastroenterology;  Laterality: N/A;  . HAND SURGERY     right  . HEEL SPUR SURGERY    . NOSE SURGERY     x2  . POLYPECTOMY  01/04/2019   Procedure: POLYPECTOMY;  Surgeon: Lin Landsman, MD;  Location: [place];  Service: Endoscopy;;  . SHOULDER ARTHROSCOPY WITH OPEN ROTATOR CUFF REPAIR Left 09/20/2018   Procedure: SHOULDER ARTHROSCOPY WITH OPEN ROTATOR CUFF REPAIR;  Surgeon: Corky Mull, MD;  Location: [facility] OR[facility];  Service: Orthopedics;  Laterality: Left;  . SHOULDER CLOSED REDUCTION Left 12/21/2018   Procedure: MANIPULATION UNDER ANESTHESIA WITH STEROID INJECTION;  Surgeon: Corky Mull, MD;  Location: [place];  Service: Orthopedics;  Laterality: Left;  Diabetic - insulin and oral meds  . UMBILICAL HERNIA REPAIR     Family History  Problem Relation Age of Onset  . Diabetes Other   . Hypertension Other   . Breast cancer Neg Hx   . Colon cancer Neg Hx    Social History   Socioeconomic History  . Marital status: Married    Spouse name: Not on file  . Number of children: 2  . Years of education: Not on file  . Highest education level: Not on file  Occupational History  . Not on file  Tobacco Use  . Smoking status:  Never Smoker  . Smokeless tobacco: Never Used  Vaping Use  . Vaping Use: Never used  Substance and Sexual Activity  . Alcohol use: No    Alcohol/week: 0.0 standard drinks  . Drug use: No  . Sexual activity: Not Currently  Other Topics Concern  . Not on file  Social History Narrative   She is married and has two children   Social Determinants of Health   Financial Resource Strain: Medium Risk  . Difficulty of Paying Living Expenses: Somewhat hard  Food Insecurity: Not on file  Transportation Needs: Not on file  Physical Activity: Not on file  Stress: Stress Concern Present  . Feeling of Stress : To some extent  Social Connections: Not on file    Outpatient Encounter Medications as of 11/13/2020  Medication Sig  . albuterol (ACCUNEB) 0.63 MG/3ML nebulizer solution Take 3 mLs (0.63 mg total) by nebulization every 6 (six) hours as needed for wheezing. (Patient not taking: Reported on 09/30/2020)  . amLODipine (NORVASC) 5 MG tablet TAKE 1 TABLET(5 MG) BY MOUTH TWICE DAILY  . budesonide-formoterol (SYMBICORT) 160-4.5 MCG/ACT inhaler Inhale 2 puffs into the lungs 2 (two) times daily.  . Calcium Carbonate-Vit D-Min (CALCIUM 600+D PLUS MINERALS) 600-400 MG-UNIT TABS Take by mouth.  . cetirizine (ZYRTEC) 10 MG tablet Take 10 mg by mouth daily. (Patient not taking: Reported on 09/30/2020)  . Cholecalciferol (VITAMIN D-3) 1000 UNITS CAPS Take 1 capsule by mouth daily.  . cyclobenzaprine (FLEXERIL) 10 MG tablet TAKE 1 TABLET BY MOUTH EVERY NIGHT AS NEEDED  . diphenhydrAMINE (BENADRYL) 25 mg capsule Take by mouth. (Patient not taking: Reported on 09/30/2020)  . HYDROcodone-acetaminophen (NORCO) 5-325 MG tablet Take 1 tablet by mouth every 4 (four) hours as needed for moderate pain.  Marland Kitchen insulin degludec (TRESIBA FLEXTOUCH) 100 UNIT/ML FlexTouch Pen Inject 0.22 mLs (22 Units total) into the skin daily.  . Insulin Pen Needle (BD PEN NEEDLE NANO U/F) 32G X 4 MM MISC Use to inject insulin daily  .  ipratropium (ATROVENT HFA) 17 MCG/ACT inhaler Inhale 2 puffs into the lungs every 6 (six) hours. (Patient not taking: Reported on 09/30/2020)  . loperamide (IMODIUM) 2 MG capsule Take 2 mg by mouth as needed.   Marland Kitchen losartan (COZAAR) 100 MG tablet TAKE 1 TABLET BY MOUTH EVERY DAY  . omeprazole (PRILOSEC) 20 MG capsule TAKE 1 CAPSULE(20 MG) BY MOUTH TWICE DAILY  . ONE TOUCH ULTRA TEST test strip PATIENT NEEDS NEW METER STRIPS AND LANCETS FOR ONE TOUCH. HER INSURANCE NO LONGER COVERS ACCUCHEK.  Marland Kitchen potassium chloride (KLOR-CON) 10 MEQ tablet TAKE 1 TABLET(10 MEQ) BY MOUTH TWICE DAILY  . rosuvastatin (CRESTOR) 10 MG tablet TAKE 1 TABLET(10 MG) BY MOUTH DAILY  . Semaglutide,0.25 or 0.5MG /DOS, (OZEMPIC, 0.25 OR 0.5 MG/DOSE,) 2 MG/1.5ML SOPN Inject 0.5 mg weekly  . triamterene-hydrochlorothiazide (MAXZIDE-25) 37.5-25 MG  tablet TAKE 1/2 TABLET BY MOUTH DAILY  . [DISCONTINUED] budesonide-formoterol (SYMBICORT) 160-4.5 MCG/ACT inhaler Inhale 2 puffs into the lungs 2 (two) times daily.   No facility-administered encounter medications on file as of 11/13/2020.    Review of Systems  Constitutional: Negative for appetite change and unexpected weight change.  HENT: Negative for congestion and sinus pressure.   Respiratory: Negative for cough, chest tightness and shortness of breath.   Cardiovascular: Negative for chest pain, palpitations and leg swelling.  Gastrointestinal: Negative for abdominal pain, diarrhea, nausea and vomiting.  Genitourinary: Negative for difficulty urinating and dysuria.  Musculoskeletal: Negative for joint swelling and myalgias.       S/p right humerus fracture and left fourth metatarsal fracture.   Skin: Negative for color change and rash.  Neurological: Negative for dizziness, light-headedness and headaches.  Psychiatric/Behavioral: Negative for agitation and dysphoric mood.       Increased stress as outlined.         Objective:    Physical Exam Constitutional:      General:  She is not in acute distress.    Appearance: Normal appearance.  HENT:     Head: Normocephalic and atraumatic.     Right Ear: External ear normal.     Left Ear: External ear normal.     Mouth/Throat:     Mouth: Oropharynx is clear and moist.  Eyes:     General: No scleral icterus.       Right eye: No discharge.        Left eye: No discharge.     Conjunctiva/sclera: Conjunctivae normal.  Neck:     Thyroid: No thyromegaly.  Cardiovascular:     Rate and Rhythm: Normal rate and regular rhythm.  Pulmonary:     Effort: No respiratory distress.     Breath sounds: Normal breath sounds. No wheezing.  Abdominal:     General: Bowel sounds are normal.     Palpations: Abdomen is soft.     Tenderness: There is no abdominal tenderness.  Musculoskeletal:        General: No swelling, tenderness or edema.     Cervical back: Neck supple.     Comments: Left shoulder immobilizer in place.    Lymphadenopathy:     Cervical: No cervical adenopathy.  Skin:    Findings: No erythema or rash.  Neurological:     Mental Status: She is alert.  Psychiatric:        Mood and Affect: Mood normal.        Behavior: Behavior normal.     BP 116/70   Pulse 87   Temp 98.6 F (37 C) (Oral)   Resp 16   Ht 5\' 4"  (1.626 m)   Wt 154 lb 3.2 oz (69.9 kg)   SpO2 99%   BMI 26.47 kg/m  Wt Readings from Last 3 Encounters:  11/13/20 154 lb 3.2 oz (69.9 kg)  10/11/20 153 lb (69.4 kg)  08/05/20 163 lb (73.9 kg)     Lab Results  Component Value Date   WBC 5.7 03/29/2020   HGB 12.3 03/29/2020   HCT 37.8 03/29/2020   PLT 241.0 03/29/2020   GLUCOSE 96 11/13/2020   CHOL 114 11/13/2020   TRIG 108.0 11/13/2020   HDL 53.70 11/13/2020   LDLDIRECT 74.0 04/19/2019   LDLCALC 38 11/13/2020   ALT 8 11/13/2020   AST 16 11/13/2020   NA 140 11/13/2020   K 3.3 (L) 11/13/2020   CL 103 11/13/2020   CREATININE  0.97 11/13/2020   BUN 12 11/13/2020   CO2 31 11/13/2020   TSH 1.83 11/13/2020   HGBA1C 5.4 11/13/2020    MICROALBUR 2.0 (H) 11/13/2020    DG Shoulder Left  Result Date: 10/11/2020 CLINICAL DATA:  Fall EXAM: LEFT SHOULDER - 2+ VIEW COMPARISON:  None. FINDINGS: There is a left humeral neck fracture. Fracture also extends toward the greater tuberosity. No subluxation or dislocation. Degenerative changes in the left AC joint. IMPRESSION: Left humeral neck fracture, nondisplaced. Minimally displaced fracture extends toward the greater tuberosity. Electronically Signed   By: Rolm Baptise M.D.   On: 10/11/2020 19:00   DG Hand Complete Right  Result Date: 10/11/2020 CLINICAL DATA:  Fall EXAM: RIGHT HAND - COMPLETE 3+ VIEW COMPARISON:  None. FINDINGS: There is a fracture through the midshaft of the 4th metacarpal. There appears to be volar subluxation of the MCP joints diffusely, likely related to arthritic change and chronic. No dislocation seen. Degenerative changes throughout the IP joints, MCP joints and 1st carpometacarpal joint. IMPRESSION: Oblique nondisplaced fracture through the midshaft of the right 4th metacarpal. Electronically Signed   By: Rolm Baptise M.D.   On: 10/11/2020 19:05       Assessment & Plan:   Problem List Items Addressed This Visit    B12 deficiency - Primary    Continue b12 injections.  Check b12 level today.       Relevant Orders   B12 and Folate Panel (Completed)   Controlled type 2 diabetes mellitus with complication, with long-term current use of insulin (HCC)    Low carb diet and exercise.  On ozempic and tresiba.  Sugars as outlined. Discussed may need to decrease insulin - if low sugars.  Follow.       GERD (gastroesophageal reflux disease)    No upper symptoms reported. On omeprazole.       Humerus fracture    Followed by ortho. Discussed bone density.        Hypercholesterolemia    On crestor.  Low cholesterol diet and exercise.  Follow lipid panel and liver function tests.        Hypertension    Blood pressure doing well on current medication  regimen.  Continue triam/hctz, amlodipine and losartan.  Follow pressures.  Follow metabolic panel.       Inflammatory polyps of colon ([place])    Followed by GI.  S/p colonoscopy with removal of multiple polyps.        Psoriatic arthritis ([place])    Followed by Dr Jefm Bryant.  Stable.       Stress    Increased stress as outlined.  Discussed.  Follow.  Notify me if feels needs any further intervention.        Other Visit Diagnoses    Persistent cough       Relevant Medications   budesonide-formoterol (SYMBICORT) 160-4.5 MCG/ACT inhaler   Essential hypertension       Need for 23-polyvalent pneumococcal polysaccharide vaccine       Relevant Orders   Pneumococcal polysaccharide vaccine 23-valent greater than or equal to 2yo subcutaneous/IM (Completed)       Einar Pheasant, MD

## 2020-11-13 NOTE — Chronic Care Management (AMB) (Signed)
Chronic Care Management Pharmacy Note  11/13/2020 Name:  Ashley Gross MRN:  [id number] DOB:  [birth year]  Subjective: Ashley Gross is an 70 y.o. year old female who is a primary patient of Einar Pheasant, MD.  The CCM team was consulted for assistance with disease management and care coordination needs.    Collaboration with PCP, LPN, CPhT for medication access in response to provider referral for pharmacy case management and/or care coordination services.   Consent to Services:  The patient was given information about Chronic Care Management services, agreed to services, and gave verbal consent prior to initiation of services.  Please see initial visit note for detailed documentation.   Objective:  Lab Results  Component Value Date   CREATININE 1.15 07/19/2020   CREATININE 1.06 03/29/2020   CREATININE 1.03 (H) 11/28/2019    Lab Results  Component Value Date   HGBA1C 6.2 07/19/2020       Component Value Date/Time   CHOL 108 07/19/2020 0913   TRIG 110.0 07/19/2020 0913   HDL 48.60 07/19/2020 0913   CHOLHDL 2 07/19/2020 0913   VLDL 22.0 07/19/2020 0913   LDLCALC 38 07/19/2020 0913   LDLCALC 56 11/01/2018 1035   LDLDIRECT 74.0 04/19/2019 1056   BP Readings from Last 3 Encounters:  11/13/20 116/70  10/11/20 107/67  08/05/20 120/68    Assessment/Interventions: Review of patient past medical history, allergies, medications, health status, including review of consultants reports, laboratory and other test data, was performed as part of comprehensive evaluation and provision of chronic care management services.   SDOH:  (Social Determinants of Health) assessments and interventions performed:    CCM Care Plan  Allergies  Allergen Reactions  . Aspirin Other (See Comments)    Increased bleeding  . Beta Adrenergic Blockers   . Lisinopril     cough  . Mtx Support [Cobalamine Combinations] Other (See Comments)    Respiratory symptoms  . Vasotec [Enalapril]  Cough  . Verapamil Other (See Comments)    Heart block  . Voltaren [Diclofenac Sodium]   . Erythromycin Other (See Comments)    Gi intolerance  . Indocin [Indomethacin]   . Jardiance [Empagliflozin] Other (See Comments)    Recurrent yeast infections, even with washout and rechallenge    Medications Reviewed Today    Reviewed by De Hollingshead, RPH-CPP (Pharmacist) on 09/30/20 at 1541  Med List Status: <None>  Medication Order Taking? Sig Documenting Provider Last Dose Status Informant  albuterol (ACCUNEB) 0.63 MG/3ML nebulizer solution [id number] No Take 3 mLs (0.63 mg total) by nebulization every 6 (six) hours as needed for wheezing.  Patient not taking: Reported on 09/30/2020   Delano Metz, [place] Not Taking Active            Med Note [facility], Willey Blade   Wed Dec 21, 2018  9:38 AM) PRN, last used several months ago  amLODipine (NORVASC) 5 MG tablet [id number] Yes TAKE 1 TABLET(5 MG) BY MOUTH TWICE DAILY Einar Pheasant, MD Taking Active   budesonide-formoterol [facility]) 160-4.5 MCG/ACT inhaler [id number] Yes Inhale 2 puffs into the lungs 2 (two) times daily. Einar Pheasant, MD Taking Active   Calcium Carbonate-Vit D-Min (CALCIUM 600+D PLUS MINERALS) 600-400 MG-UNIT TABS [id number] Yes Take by mouth. [provider] Taking Active   cetirizine (ZYRTEC) 10 MG tablet [id number] Yes Take 10 mg by mouth daily. [provider] Taking Active   Cholecalciferol (VITAMIN D-3) 1000 UNITS CAPS [id number] Yes Take 1 capsule by mouth daily. [provider]  Taking Active   cyclobenzaprine (FLEXERIL) 10 MG tablet [id number] Yes TAKE 1 TABLET BY MOUTH EVERY NIGHT AS NEEDED Einar Pheasant, MD Taking Active   diphenhydrAMINE (BENADRYL) 25 mg capsule [id number] No Take by mouth.  Patient not taking: Reported on 09/30/2020   [provider] Not Taking Active            Med Note Darnelle Maffucci, Maralyn Sago Jan 04, 2020 11:12 AM)    insulin degludec (TRESIBA FLEXTOUCH)  100 UNIT/ML FlexTouch Pen [id number] Yes Inject 0.22 mLs (22 Units total) into the skin daily. Einar Pheasant, MD Taking Active   Insulin Pen Needle (BD PEN NEEDLE NANO U/F) 32G X 4 MM MISC [id number] Yes Use to inject insulin daily Einar Pheasant, MD Taking Active   ipratropium (ATROVENT HFA) 17 MCG/ACT inhaler [id number] No Inhale 2 puffs into the lungs every 6 (six) hours.  Patient not taking: Reported on 09/30/2020   [provider] Not Taking Active            Med Note De Hollingshead   Thu Jan 04, 2020 11:12 AM) Using PRN  loperamide (IMODIUM) 2 MG capsule [id number] Yes Take 2 mg by mouth as needed.  [provider] Taking Active            Med Note Nat Christen Jan 04, 2020 11:14 AM) Taking 1 QAM  losartan (COZAAR) 100 MG tablet [id number] Yes TAKE 1 TABLET BY MOUTH EVERY DAY Einar Pheasant, MD Taking Active   omeprazole (PRILOSEC) 20 MG capsule [id number] Yes TAKE 1 CAPSULE(20 MG) BY MOUTH TWICE DAILY Einar Pheasant, MD Taking Active   ONE TOUCH ULTRA TEST test strip [id number] Yes PATIENT NEEDS NEW METER STRIPS AND LANCETS FOR ONE TOUCH. HER INSURANCE NO LONGER COVERS ACCUCHEK. Einar Pheasant, MD Taking Active   potassium chloride (KLOR-CON) 10 MEQ tablet [id number] Yes TAKE 1 TABLET(10 MEQ) BY MOUTH TWICE DAILY Einar Pheasant, MD Taking Active   rosuvastatin (CRESTOR) 10 MG tablet [id number] Yes TAKE 1 TABLET(10 MG) BY MOUTH DAILY Einar Pheasant, MD Taking Active   Semaglutide,0.25 or 0.5MG /DOS, (OZEMPIC, 0.25 OR 0.5 MG/DOSE,) 2 MG/1.5ML SOPN [id number] Yes Inject 0.5 mg weekly Einar Pheasant, MD Taking Active            Med Note Mayo Ao Sep 30, 2020  3:34 PM) 0.25 mg weekly  triamterene-hydrochlorothiazide (MAXZIDE-25) 37.5-25 MG tablet [id number] Yes TAKE 1/2 TABLET BY MOUTH DAILY Einar Pheasant, MD Taking Active           Patient Active Problem List   Diagnosis Date Noted  . Rib pain on left side 08/05/2020  . Sleep  difficulties 06/08/2020  . Swelling of lower extremity 11/26/2019  . Healthcare maintenance 09/13/2019  . Sinus congestion 02/11/2019  . B12 deficiency 02/11/2019  . Asthma 12/28/2018  . 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 Vitamin D deficiency 11/01/2018  . Tendinitis of upper biceps tendon of left shoulder 09/20/2018  . Depression 09/15/2018  . Mild intermittent asthma without complication [id number]  . Controlled type 2 diabetes mellitus with complication, with long-term current use of insulin ([place]) 09/15/2018  . 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 Traumatic incomplete tear of left rotator cuff 09/09/2018  . Bleeding internal hemorrhoids 09/13/2017  . 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 Rectal bleeding 07/15/2017  . Low back pain 03/29/2016  . Stress 12/12/2014  . Congestion of throat 01/06/2014  .  Chronic diarrhea 02/16/2013  . Inflammatory polyps of colon ([place]) 02/16/2013  . Hypertension 10/06/2012  . GERD (gastroesophageal reflux disease) 10/06/2012  . Hypercholesterolemia 10/06/2012  . Psoriatic arthritis ([place]) 06/29/2012    Conditions to be addressed/monitored: HTN, HLD and DMII  Care Plan : Medication Management  Updates made by De Hollingshead, RPH-CPP since 11/13/2020 12:00 AM    Problem: Diabetes, Hypertension, Hyperlipidemia     Long-Range Goal: Disease Progression Prevention   Recent Progress: On track  Priority: High  Note:   Current Barriers:  . Unable to independently afford treatment regimen  Pharmacist Clinical Goal(s):  Marland Kitchen Over the next 90 days, patient will verbalize ability to afford treatment regimen. . Over the next 90 days, patient will maintain control of diabetes as evidenced by improvement in A1c through collaboration with PharmD and provider.   Interventions: . Inter-disciplinary care team collaboration (see longitudinal plan of care) . Comprehensive  medication review performed; medication list updated in electronic medical record  Diabetes: . Controlled; current treatment: Ozempic 0.25 mg weekly (reduced from 0.5 mg d/t GI upset), Tresiba 22 units daily; receiving these medications from [employer] patient assistance. o Hx recurrent UTIs on [place],  . Received patient portion of [employer] application to reapply. Submitted today on behalf of patient. Will collaborate w/ CPhT for follow up.  Hypertension: . Controlled; current treatment: amlodipine 5 mg BID, losartan 100 mg QPM, triamterene/HCTZ 37.5/25 1/2 tab QAM, potassium 10 mEq BID; . Recommended to continue current regimen with collaboration with PCP   Hyperlipidemia: . Controlled; current treatment: rosuvastatin 10 mg daily . Recommended to continue current regimen with PCP  Asthma: . Controlled; current regimen: Symbicort 160/4.5 mcg 2 puffs BID. Marland Kitchen Approved for Symbicort assistance through 11/01/21. Updated script required. Faxing in today.   Patient Goals/Self-Care Activities . Over the next 90 days, patient will:  - take medications as prescribed check blood glucose periodically, document, and provide at future appointments check blood pressure periodically, document, and provide at future appointments collaborate with provider on medication access solutions  Follow Up Plan: Telephone follow up appointment with care management team member scheduled for: ~ 4 weeks     Medication Assistance: Application for Ozempic, Tresiba medication assistance program in process. Anticipated assistance start date TBD. See plan of care below for additional detail. Approved for Symbicort assistance from [employer] through 11/01/21  Follow Up:  Patient agrees to Care Plan and Follow-up.  Plan: Telephone follow up appointment with care management team member scheduled for: ~ 4 weeks as previously scheduled  [name] Darnelle Maffucci, PharmD, [place], [place] Clinical Pharmacist [employer]  at [employer] [phone]

## 2020-11-13 NOTE — Patient Instructions (Signed)
Visit Information  Patient Care Plan: Medication Management    Problem Identified: Diabetes, Hypertension, Hyperlipidemia     Long-Range Goal: Disease Progression Prevention   Recent Progress: On track  Priority: High  Note:   Current Barriers:  . Unable to independently afford treatment regimen  Pharmacist Clinical Goal(s):  Marland Kitchen Over the next 90 days, patient will verbalize ability to afford treatment regimen. . Over the next 90 days, patient will maintain control of diabetes as evidenced by improvement in A1c through collaboration with PharmD and provider.   Interventions: . Inter-disciplinary care team collaboration (see longitudinal plan of care) . Comprehensive medication review performed; medication list updated in electronic medical record  Diabetes: . Controlled; current treatment: Ozempic 0.25 mg weekly (reduced from 0.5 mg d/t GI upset), Tresiba 22 units daily; receiving these medications from [employer] patient assistance. o Hx recurrent UTIs on [place],  . Received patient portion of [employer] application to reapply. Submitted today on behalf of patient. Will collaborate w/ CPhT for follow up.  Hypertension: . Controlled; current treatment: amlodipine 5 mg BID, losartan 100 mg QPM, triamterene/HCTZ 37.5/25 1/2 tab QAM, potassium 10 mEq BID; . Recommended to continue current regimen with collaboration with PCP   Hyperlipidemia: . Controlled; current treatment: rosuvastatin 10 mg daily . Recommended to continue current regimen with PCP  Asthma: . Controlled; current regimen: Symbicort 160/4.5 mcg 2 puffs BID. Marland Kitchen Approved for Symbicort assistance through 11/01/21. Updated script required. Faxing in today.   Patient Goals/Self-Care Activities . Over the next 90 days, patient will:  - take medications as prescribed check blood glucose periodically, document, and provide at future appointments check blood pressure periodically, document, and provide at future  appointments collaborate with provider on medication access solutions  Follow Up Plan: Telephone follow up appointment with care management team member scheduled for: ~ 4 weeks     The patient verbalized understanding of instructions, educational materials, and care plan provided today and declined offer to receive copy of patient instructions, educational materials, and care plan.  Plan: Telephone follow up appointment with care management team member scheduled for: ~ 4 weeks as previously scheduled  [name] Darnelle Maffucci, PharmD, [place], [place] Clinical Pharmacist [employer] [facility] [employer] [phone]

## 2020-11-14 ENCOUNTER — Other Ambulatory Visit: Payer: Self-pay | Admitting: Internal Medicine

## 2020-11-14 DIAGNOSIS — E876 Hypokalemia: Secondary | ICD-10-CM

## 2020-11-14 NOTE — Progress Notes (Signed)
Order placed for f/u potassium check.  

## 2020-11-17 ENCOUNTER — Encounter: Payer: Self-pay | Admitting: Internal Medicine

## 2020-11-17 DIAGNOSIS — S42309A Unspecified fracture of shaft of humerus, unspecified arm, initial encounter for closed fracture: Secondary | ICD-10-CM | POA: Insufficient documentation

## 2020-11-17 NOTE — Assessment & Plan Note (Signed)
Followed by GI.  S/p colonoscopy with removal of multiple polyps.   

## 2020-11-17 NOTE — Assessment & Plan Note (Signed)
No upper symptoms reported.  On omeprazole.  

## 2020-11-17 NOTE — Assessment & Plan Note (Signed)
Low carb diet and exercise.  On ozempic and tresiba.  Sugars as outlined. Discussed may need to decrease insulin - if low sugars.  Follow.

## 2020-11-17 NOTE — Assessment & Plan Note (Signed)
On crestor.  Low cholesterol diet and exercise.  Follow lipid panel and liver function tests.   

## 2020-11-17 NOTE — Assessment & Plan Note (Signed)
Followed by Dr Kernodle.  Stable.  

## 2020-11-17 NOTE — Assessment & Plan Note (Signed)
Continue b12 injections.  Check b12 level today.

## 2020-11-17 NOTE — Assessment & Plan Note (Signed)
Increased stress as outlined.  Discussed.  Follow.  Notify me if feels needs any further intervention.

## 2020-11-17 NOTE — Assessment & Plan Note (Signed)
Blood pressure doing well on current medication regimen.  Continue triam/hctz, amlodipine and losartan.  Follow pressures.  Follow metabolic panel.

## 2020-11-17 NOTE — Assessment & Plan Note (Signed)
Followed by ortho. Discussed bone density.

## 2020-11-18 DIAGNOSIS — M79641 Pain in right hand: Secondary | ICD-10-CM | POA: Diagnosis not present

## 2020-11-18 DIAGNOSIS — S42202A Unspecified fracture of upper end of left humerus, initial encounter for closed fracture: Secondary | ICD-10-CM | POA: Diagnosis not present

## 2020-11-20 ENCOUNTER — Telehealth: Payer: Self-pay | Admitting: Internal Medicine

## 2020-11-20 NOTE — Telephone Encounter (Signed)
Patient was returning call about appt she stated that she could not do Thursday at 1:30 her husband funeral is at that time

## 2020-11-21 NOTE — Telephone Encounter (Signed)
Pt scheduled for tomorrow with Dr Nicki Reaper

## 2020-11-22 ENCOUNTER — Telehealth (INDEPENDENT_AMBULATORY_CARE_PROVIDER_SITE_OTHER): Payer: PPO | Admitting: Internal Medicine

## 2020-11-22 DIAGNOSIS — F320 Major depressive disorder, single episode, mild: Secondary | ICD-10-CM | POA: Diagnosis not present

## 2020-11-22 DIAGNOSIS — G479 Sleep disorder, unspecified: Secondary | ICD-10-CM

## 2020-11-22 DIAGNOSIS — F32A Depression, unspecified: Secondary | ICD-10-CM

## 2020-11-22 MED ORDER — TRAZODONE HCL 50 MG PO TABS: ORAL_TABLET | ORAL | 1 refills | Status: DC

## 2020-11-22 NOTE — Progress Notes (Signed)
Patient ID: Ashley Gross, female   DOB: 05-14-1951, 70 y.o.   MRN: [id number]   Virtual Visit via telephone Note  This visit type was conducted due to national recommendations for restrictions regarding the COVID-19 pandemic (e.g. social distancing).  This format is felt to be most appropriate for this patient at this time.  All issues noted in this document were discussed and addressed.  No physical exam was performed (except for noted visual exam findings with Video Visits).   I connected with Ashley Gross by telephone and verified that I am speaking with the correct person using two identifiers. Location patient: home Location provider: home office Persons participating in the telephone visit: patient, provider  The limitations, risks, security and privacy concerns of performing an evaluation and management service by telephone and the availability of in person appointments have been discussed.  It has also been discussed with the patient that there may be a patient responsible charge related to this service. The patient expressed understanding and agreed to proceed.   Reason for visit: work in appt  HPI: Work in for increased stress and difficulty sleeping.  Husband recently passed.  Had been dealing with increased stress related to his medical issues and now with his passing.  Her daughter is staying with her right now. She has good support.  Does report some decreased appetite, but is trying to make herself eat.  No vomiting.  Breathing stable.  Did have some diarrhea this week. Taking imodium to control.  Stays has problems staying asleep.  Will cry herself to sleep.  Taking melatonin and tylenol pm.  Tried trazodone previously.  Did not feel helped.  Started low dose.  Did not taper dose.     ROS: See pertinent positives and negatives per HPI.  Past Medical History:  Diagnosis Date  . Abnormal liver function test 10/06/2012  . Anemia, unspecified   . Asthma   . Colitis   .  Complication of anesthesia    asthma attack after shoulder surgery  . Diabetes mellitus ([place])   . Esophagitis   . H/O hiatal hernia   . Heart murmur   . Hx of arteriovenous malformation (AVM)   . Hypertension   . IBS (irritable bowel syndrome)   . Nephrolithiasis   . Pernicious anemia   . Personal history of colonic polyps 02/10/2013   02/08/13 colonoscopy - question of rectal varices, two 3-60mm polyps in the sigmoid colon, mass at the hepatic flexure, one 74mm polyp at the hepatic flexure, nodule at the ileocecal valve, congested mucusa in the entire colon, flattened villi mucosa in the terminal ileum.    . Pneumonia   . Psoriatic arthritis (H[facility])    s/p penicillamine, plaquenil, MTX, sulfasalazine.  s/p Embrel, Humira,.  Iritis.    . Pure hypercholesterolemia     Past Surgical History:  Procedure Laterality Date  . ABDOMINAL HYSTERECTOMY  1981   ovaries left in place  . ANKLE SURGERY     right  . BUNIONECTOMY    . CHOLECYSTECTOMY  1989  . COLONOSCOPY WITH PROPOFOL N/A 12/27/2017   Procedure: COLONOSCOPY WITH PROPOFOL;  Surgeon: Lin Landsman, MD;  [facility] ENDOSCOPY;  Service: Gastroenterology;  Laterality: N/A;  . COLONOSCOPY WITH PROPOFOL N/A 01/04/2019   Procedure: COLONOSCOPY WITH PROPOFOL;  Surgeon: Lin Landsman, MD;  Location: [place];  Service: Endoscopy;  Laterality: N/A;  Diabetic - oral and injectable  . ESOPHAGOGASTRODUODENOSCOPY (EGD) WITH PROPOFOL N/A 12/27/2017   Procedure: ESOPHAGOGASTRODUODENOSCOPY (  EGD) WITH PROPOFOL;  Surgeon: Lin Landsman, MD;  Location: [facility] ENDOSCOPY;  Service: Gastroenterology;  Laterality: N/A;  . HAND SURGERY     right  . HEEL SPUR SURGERY    . NOSE SURGERY     x2  . POLYPECTOMY  01/04/2019   Procedure: POLYPECTOMY;  Surgeon: Lin Landsman, MD;  Location: [place];  Service: Endoscopy;;  . SHOULDER ARTHROSCOPY WITH OPEN ROTATOR CUFF REPAIR Left 09/20/2018   Procedure: SHOULDER ARTHROSCOPY  WITH OPEN ROTATOR CUFF REPAIR;  Surgeon: Corky Mull, MD;  Location: [facility] ORS;  Service: Orthopedics;  Laterality: Left;  . SHOULDER CLOSED REDUCTION Left 12/21/2018   Procedure: MANIPULATION UNDER ANESTHESIA WITH STEROID INJECTION;  Surgeon: Corky Mull, MD;  Location: [place];  Service: Orthopedics;  Laterality: Left;  Diabetic - insulin and oral meds  . UMBILICAL HERNIA REPAIR      Family History  Problem Relation Age of Onset  . Diabetes Other   . Hypertension Other   . Breast cancer Neg Hx   . Colon cancer Neg Hx     SOCIAL HX: reviewed.    Current Outpatient Medications:  .  traZODone (DESYREL) 50 MG tablet, 1-2 tablets q hs, Disp: 60 tablet, Rfl: 1 .  amLODipine (NORVASC) 5 MG tablet, TAKE 1 TABLET(5 MG) BY MOUTH TWICE DAILY, Disp: 180 tablet, Rfl: 1 .  budesonide-formoterol (SYMBICORT) 160-4.5 MCG/ACT inhaler, Inhale 2 puffs into the lungs 2 (two) times daily., Disp: 3 each, Rfl: 3 .  Calcium Carbonate-Vit D-Min (CALCIUM 600+D PLUS MINERALS) 600-400 MG-UNIT TABS, Take by mouth., Disp: , Rfl:  .  Cholecalciferol (VITAMIN D-3) 1000 UNITS CAPS, Take 1 capsule by mouth daily., Disp: , Rfl:  .  cyclobenzaprine (FLEXERIL) 10 MG tablet, TAKE 1 TABLET BY MOUTH EVERY NIGHT AS NEEDED, Disp: 30 tablet, Rfl: 0 .  HYDROcodone-acetaminophen (NORCO) 5-325 MG tablet, Take 1 tablet by mouth every 4 (four) hours as needed for moderate pain., Disp: 20 tablet, Rfl: 0 .  insulin degludec (TRESIBA FLEXTOUCH) 100 UNIT/ML FlexTouch Pen, Inject 0.22 mLs (22 Units total) into the skin daily., Disp: 12 mL, Rfl: 2 .  Insulin Pen Needle (BD PEN NEEDLE NANO U/F) 32G X 4 MM MISC, Use to inject insulin daily, Disp: 100 each, Rfl: 3 .  loperamide (IMODIUM) 2 MG capsule, Take 2 mg by mouth as needed. , Disp: , Rfl:  .  losartan (COZAAR) 100 MG tablet, TAKE 1 TABLET BY MOUTH EVERY DAY, Disp: 90 tablet, Rfl: 1 .  omeprazole (PRILOSEC) 20 MG capsule, TAKE 1 CAPSULE(20 MG) BY MOUTH TWICE DAILY, Disp:  180 capsule, Rfl: 1 .  ONE TOUCH ULTRA TEST test strip, PATIENT NEEDS NEW METER STRIPS AND LANCETS FOR ONE TOUCH. HER INSURANCE NO LONGER COVERS ACCUCHEK., Disp: 100 each, Rfl: PRN .  potassium chloride (KLOR-CON) 10 MEQ tablet, TAKE 1 TABLET(10 MEQ) BY MOUTH TWICE DAILY, Disp: 180 tablet, Rfl: 1 .  rosuvastatin (CRESTOR) 10 MG tablet, TAKE 1 TABLET(10 MG) BY MOUTH DAILY, Disp: 90 tablet, Rfl: 1 .  Semaglutide,0.25 or 0.5MG /DOS, (OZEMPIC, 0.25 OR 0.5 MG/DOSE,) 2 MG/1.5ML SOPN, Inject 0.5 mg weekly, Disp: 1 pen, Rfl: 0 .  triamterene-hydrochlorothiazide (MAXZIDE-25) 37.5-25 MG tablet, TAKE 1/2 TABLET BY MOUTH DAILY, Disp: 45 tablet, Rfl: 1  EXAM:  GENERAL: alert. Sounds to be in no acute distress.  Answering questions appropriately.   PSYCH/NEURO: pleasant and cooperative, no obvious depression or anxiety, speech and thought processing grossly intact  ASSESSMENT AND PLAN:  Discussed the  following assessment and plan:  Problem List Items Addressed This Visit    Mild depression ([place])    Increased stress and some sadness - husband recently passed.  Trazodone to help with sleep.  Has good support.  Does not feel she needs any further intervention.  Follow.       Relevant Medications   traZODone (DESYREL) 50 MG tablet   Sleep difficulties    Discussed with her today.  Increased stress as outlined.  Has good support. Denies any significant depression.  Having difficulty sleeping as outlined.  Discussed treatment options.  She has tried melatonin and tylenol pm.  Will stop the tylenol pm.  Trial of trazodone. Can titrate dose.  Pt agreeable.  Will contact me with f/u regarding the medication, her sleep, etc.            I discussed the assessment and treatment plan with the patient. The patient was provided an opportunity to ask questions and all were answered. The patient agreed with the plan and demonstrated an understanding of the instructions.   The patient was advised to call back or  seek an in-person evaluation if the symptoms worsen or if the condition fails to improve as anticipated.  I provided 23 minutes of non-face-to-face time during this encounter.   Einar Pheasant, MD

## 2020-11-23 ENCOUNTER — Encounter: Payer: Self-pay | Admitting: Internal Medicine

## 2020-11-23 NOTE — Assessment & Plan Note (Signed)
Increased stress and some sadness - husband recently passed.  Trazodone to help with sleep.  Has good support.  Does not feel she needs any further intervention.  Follow.

## 2020-11-23 NOTE — Assessment & Plan Note (Signed)
Discussed with her today.  Increased stress as outlined.  Has good support. Denies any significant depression.  Having difficulty sleeping as outlined.  Discussed treatment options.  She has tried melatonin and tylenol pm.  Will stop the tylenol pm.  Trial of trazodone. Can titrate dose.  Pt agreeable.  Will contact me with f/u regarding the medication, her sleep, etc.

## 2020-11-25 DIAGNOSIS — M25612 Stiffness of left shoulder, not elsewhere classified: Secondary | ICD-10-CM | POA: Diagnosis not present

## 2020-11-25 DIAGNOSIS — M25512 Pain in left shoulder: Secondary | ICD-10-CM | POA: Diagnosis not present

## 2020-11-26 ENCOUNTER — Other Ambulatory Visit (INDEPENDENT_AMBULATORY_CARE_PROVIDER_SITE_OTHER): Payer: PPO

## 2020-11-26 ENCOUNTER — Other Ambulatory Visit: Payer: Self-pay

## 2020-11-26 DIAGNOSIS — E876 Hypokalemia: Secondary | ICD-10-CM | POA: Diagnosis not present

## 2020-11-26 LAB — POTASSIUM: Potassium: 4.4 mEq/L (ref 3.5–5.1)

## 2020-11-27 ENCOUNTER — Ambulatory Visit: Payer: PPO | Admitting: Pharmacist

## 2020-11-27 ENCOUNTER — Other Ambulatory Visit: Payer: Self-pay | Admitting: Internal Medicine

## 2020-11-27 DIAGNOSIS — E876 Hypokalemia: Secondary | ICD-10-CM

## 2020-11-27 DIAGNOSIS — I10 Essential (primary) hypertension: Secondary | ICD-10-CM

## 2020-11-27 DIAGNOSIS — E1121 Type 2 diabetes mellitus with diabetic nephropathy: Secondary | ICD-10-CM

## 2020-11-27 DIAGNOSIS — E7800 Pure hypercholesterolemia, unspecified: Secondary | ICD-10-CM

## 2020-11-27 DIAGNOSIS — F439 Reaction to severe stress, unspecified: Secondary | ICD-10-CM

## 2020-11-27 MED ORDER — TRESIBA FLEXTOUCH 100 UNIT/ML ~~LOC~~ SOPN: 18.0000 [IU] | PEN_INJECTOR | Freq: Every day | SUBCUTANEOUS | 2 refills | Status: DC

## 2020-11-27 NOTE — Patient Instructions (Signed)
Visit Information  Goals Addressed              This Visit's Progress     Patient Stated   .  Medication Monitoring (pt-stated)        Patient Goals/Self-Care Activities . Over the next 90 days, patient will:  - take medications as prescribed check blood glucose periodically, document, and provide at future appointments check blood pressure periodically, document, and provide at future appointments collaborate with provider on medication access solutions         Patient verbalizes understanding of instructions provided today and agrees to view in [place].   Plan: Telephone follow up appointment with care management team member scheduled for:  ~ 4 weeks  [name] Darnelle Maffucci, PharmD, [place], [place] Clinical Pharmacist [employer] at [employer] [phone]

## 2020-11-27 NOTE — Chronic Care Management (AMB) (Signed)
Chronic Care Management Pharmacy Note  11/27/2020 Name:  Ashley Gross MRN:  [id number] DOB:  Nov 09, 1950  Subjective: Ashley Gross is an 70 y.o. year old female who is a primary patient of Einar Pheasant, MD.  The CCM team was consulted for assistance with disease management and care coordination needs.    Engaged with patient by telephone for follow up visit in response to provider referral for pharmacy case management and/or care coordination services.   Consent to Services:  The patient was given information about Chronic Care Management services, agreed to services, and gave verbal consent prior to initiation of services.  Please see initial visit note for detailed documentation.   Objective:  Lab Results  Component Value Date   CREATININE 0.97 11/13/2020   CREATININE 1.15 07/19/2020   CREATININE 1.06 03/29/2020    Lab Results  Component Value Date   HGBA1C 5.4 11/13/2020       Component Value Date/Time   CHOL 114 11/13/2020 0859   TRIG 108.0 11/13/2020 0859   HDL 53.70 11/13/2020 0859   CHOLHDL 2 11/13/2020 0859   VLDL 21.6 11/13/2020 0859   LDLCALC 38 11/13/2020 0859   LDLCALC 56 11/01/2018 1035   LDLDIRECT 74.0 04/19/2019 1056     BP Readings from Last 3 Encounters:  11/13/20 116/70  10/11/20 107/67  08/05/20 120/68    Assessment: Review of patient past medical history, allergies, medications, health status, including review of consultants reports, laboratory and other test data, was performed as part of comprehensive evaluation and provision of chronic care management services.   SDOH:  (Social Determinants of Health) assessments and interventions performed:  SDOH Interventions   Flowsheet Row Most Recent Value  SDOH Interventions   SDOH Interventions for the Following Domains Depression  Financial Strain Interventions Other (Comment)  [manufacturer assistance]  Stress Interventions Provide Counseling      CCM Care Plan  Allergies   Allergen Reactions  . Aspirin Other (See Comments)    Increased bleeding  . Beta Adrenergic Blockers   . Lisinopril     cough  . Mtx Support [Cobalamine Combinations] Other (See Comments)    Respiratory symptoms  . Vasotec [Enalapril] Cough  . Verapamil Other (See Comments)    Heart block  . Voltaren [Diclofenac Sodium]   . Erythromycin Other (See Comments)    Gi intolerance  . Indocin [Indomethacin]   . Jardiance [Empagliflozin] Other (See Comments)    Recurrent yeast infections, even with washout and rechallenge    Medications Reviewed Today    Reviewed by De Hollingshead, RPH-CPP (Pharmacist) on 11/27/20 at 1532  Med List Status: <None>  Medication Order Taking? Sig Documenting Provider Last Dose Status Informant  amLODipine (NORVASC) 5 MG tablet [id number] Yes TAKE 1 TABLET(5 MG) BY MOUTH TWICE DAILY Einar Pheasant, MD Taking Active   budesonide-formoterol [facility]) 160-4.5 MCG/ACT inhaler [id number] Yes Inhale 2 puffs into the lungs 2 (two) times daily. Einar Pheasant, MD Taking Active   Calcium Carbonate-Vit D-Min (CALCIUM 600+D PLUS MINERALS) 600-400 MG-UNIT TABS [id number] Yes Take by mouth. [provider] Taking Active   Cholecalciferol (VITAMIN D-3) 1000 UNITS CAPS [id number] Yes Take 1 capsule by mouth daily. [provider] Taking Active   cyclobenzaprine (FLEXERIL) 10 MG tablet [id number] Yes TAKE 1 TABLET BY MOUTH EVERY NIGHT AS NEEDED Einar Pheasant, MD Taking Active   HYDROcodone-acetaminophen [facility]) 5-325 MG tablet [id number] Yes Take 1 tablet by mouth every 4 (four) hours as needed for moderate pain. Duanne Guess,  PA-C Taking Active   insulin degludec (TRESIBA FLEXTOUCH) 100 UNIT/ML FlexTouch Pen [id number] Yes Inject 0.22 mLs (22 Units total) into the skin daily. Dale [place], MD Taking Active   Insulin Pen Needle (BD PEN NEEDLE NANO U/F) 32G X 4 MM MISC [id number] Yes Use to inject insulin daily Dale [place], MD Taking Active    loperamide (IMODIUM) 2 MG capsule [id number] Yes Take 2 mg by mouth as needed.  [provider] Taking Active            Med Note Tyrone Nine Jan 04, 2020 11:14 AM) Taking 1 QAM  losartan (COZAAR) 100 MG tablet [id number] Yes TAKE 1 TABLET BY MOUTH EVERY DAY Dale [place], MD Taking Active   omeprazole (PRILOSEC) 20 MG capsule [id number] Yes TAKE 1 CAPSULE(20 MG) BY MOUTH TWICE DAILY Dale [place], MD Taking Active   ONE TOUCH ULTRA TEST test strip [id number] Yes PATIENT NEEDS NEW METER STRIPS AND LANCETS FOR ONE TOUCH. HER INSURANCE NO LONGER COVERS ACCUCHEK. Dale [place], MD Taking Active   potassium chloride (KLOR-CON) 10 MEQ tablet [id number] Yes TAKE 1 TABLET(10 MEQ) BY MOUTH TWICE DAILY Dale [place], MD Taking Active            Med Note Feliz Beam, Martin Majestic Nov 27, 2020  3:31 PM) Taking daily   rosuvastatin (CRESTOR) 10 MG tablet [id number] Yes TAKE 1 TABLET(10 MG) BY MOUTH DAILY Dale [place], MD Taking Active   Semaglutide,0.25 or 0.5MG /DOS, (OZEMPIC, 0.25 OR 0.5 MG/DOSE,) 2 MG/1.5ML SOPN [id number] Yes Inject 0.5 mg weekly Dale [place], MD Taking Active            Med Note Edwin Cap Nov 27, 2020  3:32 PM) 0.25 mg + 5 clicks  traZODone (DESYREL) 50 MG tablet [id number] Yes 1-2 tablets q hs Dale [place], MD Taking Active   triamterene-hydrochlorothiazide (MAXZIDE-25) 37.5-25 MG tablet [id number] Yes TAKE 1/2 TABLET BY MOUTH DAILY Dale [place], MD Taking Active           Patient Active Problem List   Diagnosis Date Noted  . Humerus fracture 11/17/2020  . Rib pain on left side 08/05/2020  . Sleep difficulties 06/08/2020  . Swelling of lower extremity 11/26/2019  . Healthcare maintenance 09/13/2019  . Sinus congestion 02/11/2019  . B12 deficiency 02/11/2019  . Asthma 12/28/2018  . 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 Vitamin D deficiency 11/01/2018  . Tendinitis of upper biceps tendon of left shoulder  09/20/2018  . Mild depression (HCC) 09/15/2018  . Mild intermittent asthma without complication 09/15/2018  . Controlled type 2 diabetes mellitus with complication, with long-term current use of insulin (HCC) 09/15/2018  . 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 Traumatic incomplete tear of left rotator cuff 09/09/2018  . Bleeding internal hemorrhoids 09/13/2017  . 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 Rectal bleeding 07/15/2017  . Low back pain 03/29/2016  . Stress 12/12/2014  . Congestion of throat 01/06/2014  . Chronic diarrhea 02/16/2013  . Inflammatory polyps of colon (HCC) 02/16/2013  . Hypertension 10/06/2012  . GERD (gastroesophageal reflux disease) 10/06/2012  . Hypercholesterolemia 10/06/2012  . Psoriatic arthritis (HCC) 06/29/2012    Conditions to be addressed/monitored: HTN, HLD, DMII and CKD  Care Plan : Medication Management  Updates made by Lourena Simmonds, RPH-CPP since 11/27/2020 12:00 AM    Problem: Diabetes, Hypertension, Hyperlipidemia     Long-Range Goal: Disease Progression Prevention  This Visit's Progress: On track  Recent Progress: On track  Priority: High  Note:   Current Barriers:  . Unable to independently afford treatment regimen  Pharmacist Clinical Goal(s):  Marland Kitchen Over the next 90 days, patient will verbalize ability to afford treatment regimen. . Over the next 90 days, patient will maintain control of diabetes as evidenced by improvement in A1c through collaboration with PharmD and provider.   Interventions: . 1:1 collaboration with Einar Pheasant, MD regarding development and update of comprehensive plan of care as evidenced by provider attestation and co-signature . Inter-disciplinary care team collaboration (see longitudinal plan of care) . Comprehensive medication review performed; medication list updated in electronic medical record  Health Maintenance: . Husband passed away  last week. Patient having difficulty sleeping.   Diabetes: . Controlled; current treatment: Ozempic [id number] mg weekly + 5 clicks (reduced from 0.5 mg d/t GI upset), Tresiba 22 units daily; o Hx recurrent UTIs on Jardiance,  . Denies any concerns with hypoglycemia. Wonder if overnight asymptomatic hypoglycemia is occurring, given very well controlled A1c.  . Notes that she has not been eating as much lately, combination of grief and Ozempic dose increase.  . Decrease Tresiba to 18 units daily to reduce risk of hypoglycemia.  . Patient approved for [employer] patient assistance. Awaiting shipment.   Hypertension: . Controlled; current treatment: amlodipine 5 mg BID, losartan 100 mg QPM, triamterene/HCTZ 37.5/25 1/2 tab QAM, potassium 10 mEq BID; . Missed a few doses of potassium, resulted in hypokalemia. Recheck WNL, patient advised to continue potassium BID. Reitreated that to her today  . Recommended to continue current regimen with collaboration with PCP   Hyperlipidemia: . Controlled per last lipid panel; current treatment: rosuvastatin 10 mg daily . Recommended to continue current regimen with PCP  Asthma: . Controlled; current regimen: Symbicort 160/4.5 mcg 2 puffs BID. Marland Kitchen Approved for Symbicort assistance through 11/01/21. Updated script required. Faxing in today.   Grief/Insomnia: . Uncontrolled; reports that she had been taking trazodone 50-100 mg QPM, but last night was a "bad night". She took 200 mg at bedtime and took another 100 mg at 3 am when she couldn't fall asleep. Overslept her alarm and was late for work.  . Previously tried: hx melatonin with no benefit. Had been taking Tylenol PM prior to trazodone prescription from PCP on Friday . Reviewed that trazodone has greater sleep potential at lower doses, and no benefit expected from high PRN doses. Discussed risk of arrhythmias (especially in patient at with history of hypokalemia).  . Recommend to limit to 50-100 mg QPM and  give at least 2 weeks to determine full benefit. Discussed retrial of melatonin vs PRN benadryl (only for short period of time). Could consider small dose and short course of benzodiazapine, given acute grief. Strongly recommend counseling.   Patient Goals/Self-Care Activities . Over the next 90 days, patient will:  - take medications as prescribed check blood glucose periodically, document, and provide at future appointments check blood pressure periodically, document, and provide at future appointments collaborate with provider on medication access solutions  Follow Up Plan: Telephone follow up appointment with care management team member scheduled for: ~ 4 weeks     Medication Assistance: Ozempic, Tresiba obtained through [employer] medication assistance program.  Enrollment ends 10/01/21. Symbicort obtained through [employer] assistance program. Enrollment ends 11/01/21.  Follow Up:  Patient agrees to Care Plan and Follow-up.  Plan: Telephone follow up appointment with care management team member scheduled for:  ~  4 weeks  [name] Darnelle Maffucci, PharmD, [place], [place] Clinical Pharmacist [employer] at [place]

## 2020-11-27 NOTE — Progress Notes (Signed)
Order placed for f/u potassium check.  

## 2020-11-29 ENCOUNTER — Other Ambulatory Visit: Payer: Self-pay | Admitting: Internal Medicine

## 2020-11-29 DIAGNOSIS — M25512 Pain in left shoulder: Secondary | ICD-10-CM | POA: Diagnosis not present

## 2020-11-29 DIAGNOSIS — M25612 Stiffness of left shoulder, not elsewhere classified: Secondary | ICD-10-CM | POA: Diagnosis not present

## 2020-12-04 DIAGNOSIS — M25512 Pain in left shoulder: Secondary | ICD-10-CM | POA: Diagnosis not present

## 2020-12-04 DIAGNOSIS — M25612 Stiffness of left shoulder, not elsewhere classified: Secondary | ICD-10-CM | POA: Diagnosis not present

## 2020-12-06 DIAGNOSIS — M25512 Pain in left shoulder: Secondary | ICD-10-CM | POA: Diagnosis not present

## 2020-12-06 DIAGNOSIS — M25612 Stiffness of left shoulder, not elsewhere classified: Secondary | ICD-10-CM | POA: Diagnosis not present

## 2020-12-12 ENCOUNTER — Other Ambulatory Visit: Payer: PPO

## 2020-12-13 DIAGNOSIS — S42202A Unspecified fracture of upper end of left humerus, initial encounter for closed fracture: Secondary | ICD-10-CM | POA: Diagnosis not present

## 2020-12-16 ENCOUNTER — Telehealth: Payer: Self-pay

## 2020-12-16 DIAGNOSIS — B351 Tinea unguium: Secondary | ICD-10-CM | POA: Diagnosis not present

## 2020-12-16 DIAGNOSIS — M778 Other enthesopathies, not elsewhere classified: Secondary | ICD-10-CM | POA: Diagnosis not present

## 2020-12-16 DIAGNOSIS — Q6689 Other  specified congenital deformities of feet: Secondary | ICD-10-CM | POA: Diagnosis not present

## 2020-12-16 DIAGNOSIS — L600 Ingrowing nail: Secondary | ICD-10-CM | POA: Diagnosis not present

## 2020-12-16 NOTE — Telephone Encounter (Signed)
Called patient to let her know that 2 boxes of her tresiba had arrived. LM for her to pick up at earliest convenience. 2 boxes of tresiba labled and placed in sample refrigerator for pick up.

## 2020-12-17 DIAGNOSIS — M25612 Stiffness of left shoulder, not elsewhere classified: Secondary | ICD-10-CM | POA: Diagnosis not present

## 2020-12-17 DIAGNOSIS — M25512 Pain in left shoulder: Secondary | ICD-10-CM | POA: Diagnosis not present

## 2020-12-20 ENCOUNTER — Telehealth (INDEPENDENT_AMBULATORY_CARE_PROVIDER_SITE_OTHER): Payer: PPO | Admitting: Internal Medicine

## 2020-12-20 ENCOUNTER — Encounter: Payer: Self-pay | Admitting: Internal Medicine

## 2020-12-20 DIAGNOSIS — F439 Reaction to severe stress, unspecified: Secondary | ICD-10-CM

## 2020-12-20 DIAGNOSIS — J4520 Mild intermittent asthma, uncomplicated: Secondary | ICD-10-CM

## 2020-12-20 DIAGNOSIS — G479 Sleep disorder, unspecified: Secondary | ICD-10-CM | POA: Diagnosis not present

## 2020-12-20 DIAGNOSIS — M25512 Pain in left shoulder: Secondary | ICD-10-CM | POA: Diagnosis not present

## 2020-12-20 DIAGNOSIS — M25612 Stiffness of left shoulder, not elsewhere classified: Secondary | ICD-10-CM | POA: Diagnosis not present

## 2020-12-20 MED ORDER — MIRTAZAPINE 15 MG PO TABS
ORAL_TABLET | ORAL | 1 refills | Status: DC
Start: 2020-12-20 — End: 2020-12-26

## 2020-12-20 NOTE — Telephone Encounter (Signed)
Can work in - schedule a virtual appt to discuss.

## 2020-12-20 NOTE — Progress Notes (Signed)
Patient ID: Ashley Gross, female   DOB: Apr 20, 1951, 70 y.o.   MRN: [id number]   Virtual Visit via video Note  This visit type was conducted due to national recommendations for restrictions regarding the COVID-19 pandemic (e.g. social distancing).  This format is felt to be most appropriate for this patient at this time.  All issues noted in this document were discussed and addressed.  No physical exam was performed (except for noted visual exam findings with Video Visits).   I connected with Lajuana Ripple by a video enabled telemedicine application and verified that I am speaking with the correct person using two identifiers. Location patient: home Location provider: work Persons participating in the virtual visit: patient, provider  The limitations, risks, security and privacy concerns of performing an evaluation and management service by video and the availability of in person appointments have been discussed.  It has also been discussed with the patient that there may be a patient responsible charge related to this service. The patient expressed understanding and agreed to proceed.   Reason for visit: work in appt  HPI: Work in for concerns regarding sleep issues. Increased stress - husband recently passed. Was started on trazodone.  Taking two q hs.  Stopped taking beginning of this week.  Feels is not working for her.  Goes to bed aroun 8:30 - 9:00 pm.  May then sleep 4-5 hours and then is awake the rest of the night.  Does not sleep during the day.  Is back at work.  Has good support.  She is eating, but not eating as much.  No problems with low blood sugars.  Weight down - states weighed 140 pounds today.  Discussed remeron.  Breathing stable.    ROS: See pertinent positives and negatives per HPI.  Past Medical History:  Diagnosis Date  . Abnormal liver function test 10/06/2012  . Anemia, unspecified   . Asthma   . Colitis   . Complication of anesthesia    asthma attack after  shoulder surgery  . Diabetes mellitus ([place])   . Esophagitis   . H/O hiatal hernia   . Heart murmur   . Hx of arteriovenous malformation (AVM)   . Hypertension   . IBS (irritable bowel syndrome)   . Nephrolithiasis   . Pernicious anemia   . Personal history of colonic polyps 02/10/2013   02/08/13 colonoscopy - question of rectal varices, two 3-18mm polyps in the sigmoid colon, mass at the hepatic flexure, one 67mm polyp at the hepatic flexure, nodule at the ileocecal valve, congested mucusa in the entire colon, flattened villi mucosa in the terminal ileum.    . Pneumonia   . Psoriatic arthritis (HCC)    s/p penicillamine, plaquenil, MTX, sulfasalazine.  s/p Embrel, Humira,.  Iritis.    . Pure hypercholesterolemia     Past Surgical History:  Procedure Laterality Date  . ABDOMINAL HYSTERECTOMY  1981   ovaries left in place  . ANKLE SURGERY     right  . BUNIONECTOMY    . CHOLECYSTECTOMY  1989  . COLONOSCOPY WITH PROPOFOL N/A 12/27/2017   Procedure: COLONOSCOPY WITH PROPOFOL;  Surgeon: Lin Landsman, MD;  Location: [facility] ENDOSCOPY;  Service: Gastroenterology;  Laterality: N/A;  . COLONOSCOPY WITH PROPOFOL N/A 01/04/2019   Procedure: COLONOSCOPY WITH PROPOFOL;  Surgeon: Lin Landsman, MD;  Location: [place];  Service: Endoscopy;  Laterality: N/A;  Diabetic - oral and injectable  . ESOPHAGOGASTRODUODENOSCOPY (EGD) WITH PROPOFOL N/A 12/27/2017   Procedure:  ESOPHAGOGASTRODUODENOSCOPY (EGD) WITH PROPOFOL;  Surgeon: Lin Landsman, MD;  Location: [facility] ENDOSCOPY;  Service: Gastroenterology;  Laterality: N/A;  . HAND SURGERY     right  . HEEL SPUR SURGERY    . NOSE SURGERY     x2  . POLYPECTOMY  01/04/2019   Procedure: POLYPECTOMY;  Surgeon: Lin Landsman, MD;  Location: [place];  Service: Endoscopy;;  . SHOULDER ARTHROSCOPY WITH OPEN ROTATOR CUFF REPAIR Left 09/20/2018   Procedure: SHOULDER ARTHROSCOPY WITH OPEN ROTATOR CUFF REPAIR;  Surgeon: Corky Mull, MD;  Location: [facility] ORS;  Service: Orthopedics;  Laterality: Left;  . SHOULDER CLOSED REDUCTION Left 12/21/2018   Procedure: MANIPULATION UNDER ANESTHESIA WITH STEROID INJECTION;  Surgeon: Corky Mull, MD;  Location: [place];  Service: Orthopedics;  Laterality: Left;  Diabetic - insulin and oral meds  . UMBILICAL HERNIA REPAIR      Family History  Problem Relation Age of Onset  . Diabetes Other   . Hypertension Other   . Breast cancer Neg Hx   . Colon cancer Neg Hx     SOCIAL HX: reviewed.    Current Outpatient Medications:  .  mirtazapine (REMERON) 15 MG tablet, 1/2 tablet q hs prn, Disp: 15 tablet, Rfl: 1 .  amLODipine (NORVASC) 5 MG tablet, TAKE 1 TABLET(5 MG) BY MOUTH TWICE DAILY, Disp: 180 tablet, Rfl: 1 .  budesonide-formoterol (SYMBICORT) 160-4.5 MCG/ACT inhaler, Inhale 2 puffs into the lungs 2 (two) times daily., Disp: 3 each, Rfl: 3 .  Calcium Carbonate-Vit D-Min (CALCIUM 600+D PLUS MINERALS) 600-400 MG-UNIT TABS, Take by mouth., Disp: , Rfl:  .  Cholecalciferol (VITAMIN D-3) 1000 UNITS CAPS, Take 1 capsule by mouth daily., Disp: , Rfl:  .  cyclobenzaprine (FLEXERIL) 10 MG tablet, TAKE 1 TABLET BY MOUTH EVERY NIGHT AS NEEDED, Disp: 30 tablet, Rfl: 0 .  HYDROcodone-acetaminophen (NORCO) 5-325 MG tablet, Take 1 tablet by mouth every 4 (four) hours as needed for moderate pain., Disp: 20 tablet, Rfl: 0 .  insulin degludec (TRESIBA FLEXTOUCH) 100 UNIT/ML FlexTouch Pen, Inject 18 Units into the skin daily., Disp: 12 mL, Rfl: 2 .  Insulin Pen Needle (BD PEN NEEDLE NANO U/F) 32G X 4 MM MISC, Use to inject insulin daily, Disp: 100 each, Rfl: 3 .  loperamide (IMODIUM) 2 MG capsule, Take 2 mg by mouth as needed. , Disp: , Rfl:  .  losartan (COZAAR) 100 MG tablet, TAKE 1 TABLET BY MOUTH EVERY DAY, Disp: 90 tablet, Rfl: 1 .  omeprazole (PRILOSEC) 20 MG capsule, TAKE 1 CAPSULE(20 MG) BY MOUTH TWICE DAILY, Disp: 180 capsule, Rfl: 1 .  ONE TOUCH ULTRA TEST test strip, PATIENT  NEEDS NEW METER STRIPS AND LANCETS FOR ONE TOUCH. HER INSURANCE NO LONGER COVERS ACCUCHEK., Disp: 100 each, Rfl: PRN .  potassium chloride (KLOR-CON) 10 MEQ tablet, TAKE 1 TABLET(10 MEQ) BY MOUTH TWICE DAILY, Disp: 180 tablet, Rfl: 1 .  rosuvastatin (CRESTOR) 10 MG tablet, TAKE 1 TABLET(10 MG) BY MOUTH DAILY, Disp: 90 tablet, Rfl: 1 .  Semaglutide,0.25 or 0.5MG /DOS, (OZEMPIC, 0.25 OR 0.5 MG/DOSE,) 2 MG/1.5ML SOPN, Inject 0.5 mg weekly, Disp: 1 pen, Rfl: 0 .  triamterene-hydrochlorothiazide (MAXZIDE-25) 37.5-25 MG tablet, TAKE 1/2 TABLET BY MOUTH DAILY, Disp: 45 tablet, Rfl: 1  EXAM:  VITALS per patient if applicable: 884 pounds  GENERAL: alert, oriented, appears well and in no acute distress  HEENT: atraumatic, conjunttiva clear, no obvious abnormalities on inspection of external nose and ears  NECK: normal movements of  the head and neck  LUNGS: on inspection no signs of respiratory distress, breathing rate appears normal, no obvious gross SOB, gasping or wheezing  CV: no obvious cyanosis  PSYCH/NEURO: pleasant and cooperative, no obvious depression or anxiety, speech and thought processing grossly intact  ASSESSMENT AND PLAN:  Discussed the following assessment and plan:  Problem List Items Addressed This Visit    Asthma    Breathing stable.        Sleep difficulties    Has good support.  Off trazodone.  With the increased stress, decreased po intake and sleep issues, discussed remeron.  Remain off trazodone.  Start 7.5mg  remeron.  Increase if needed.  Follow closely.  Call with update.        Stress    Increased stress as outlined.  Start remeron.  Follow.           I discussed the assessment and treatment plan with the patient. The patient was provided an opportunity to ask questions and all were answered. The patient agreed with the plan and demonstrated an understanding of the instructions.   The patient was advised to call back or seek an in-person evaluation if the  symptoms worsen or if the condition fails to improve as anticipated.   Einar Pheasant, MD

## 2020-12-21 ENCOUNTER — Encounter: Payer: Self-pay | Admitting: Internal Medicine

## 2020-12-21 NOTE — Assessment & Plan Note (Signed)
Has good support.  Off trazodone.  With the increased stress, decreased po intake and sleep issues, discussed remeron.  Remain off trazodone.  Start 7.5mg  remeron.  Increase if needed.  Follow closely.  Call with update.

## 2020-12-21 NOTE — Assessment & Plan Note (Signed)
Breathing stable.

## 2020-12-21 NOTE — Assessment & Plan Note (Signed)
Increased stress as outlined.  Start remeron.  Follow.

## 2020-12-24 ENCOUNTER — Encounter: Payer: Self-pay | Admitting: Internal Medicine

## 2020-12-24 DIAGNOSIS — M25612 Stiffness of left shoulder, not elsewhere classified: Secondary | ICD-10-CM | POA: Diagnosis not present

## 2020-12-24 DIAGNOSIS — M25512 Pain in left shoulder: Secondary | ICD-10-CM | POA: Diagnosis not present

## 2020-12-26 ENCOUNTER — Ambulatory Visit (INDEPENDENT_AMBULATORY_CARE_PROVIDER_SITE_OTHER): Payer: PPO | Admitting: Pharmacist

## 2020-12-26 ENCOUNTER — Other Ambulatory Visit: Payer: Self-pay | Admitting: Internal Medicine

## 2020-12-26 DIAGNOSIS — E7800 Pure hypercholesterolemia, unspecified: Secondary | ICD-10-CM | POA: Diagnosis not present

## 2020-12-26 DIAGNOSIS — E1121 Type 2 diabetes mellitus with diabetic nephropathy: Secondary | ICD-10-CM

## 2020-12-26 DIAGNOSIS — J4520 Mild intermittent asthma, uncomplicated: Secondary | ICD-10-CM | POA: Diagnosis not present

## 2020-12-26 DIAGNOSIS — F439 Reaction to severe stress, unspecified: Secondary | ICD-10-CM

## 2020-12-26 DIAGNOSIS — I10 Essential (primary) hypertension: Secondary | ICD-10-CM | POA: Diagnosis not present

## 2020-12-26 DIAGNOSIS — G479 Sleep disorder, unspecified: Secondary | ICD-10-CM

## 2020-12-26 MED ORDER — MIRTAZAPINE 15 MG PO TABS: 15.0000 mg | ORAL_TABLET | Freq: Every day | ORAL | 1 refills | Status: DC

## 2020-12-26 NOTE — Progress Notes (Signed)
rx sent in for remeron 15mg  #30 with one refill.

## 2020-12-26 NOTE — Patient Instructions (Signed)
Visit Information  PATIENT GOALS: Goals Addressed              This Visit's Progress     Patient Stated   .  Medication Monitoring (pt-stated)        Patient Goals/Self-Care Activities . Over the next 90 days, patient will:  - take medications as prescribed check blood glucose periodically, document, and provide at future appointments check blood pressure periodically, document, and provide at future appointments collaborate with provider on medication access solutions          Patient verbalizes understanding of instructions provided today and agrees to view in [place].  Plan: Telephone follow up appointment with care management team member scheduled for:  ~ 8 weeks  [name] Darnelle Maffucci, PharmD, [place], [place] Clinical Pharmacist [employer] at [employer] [phone]

## 2020-12-26 NOTE — Progress Notes (Signed)
Chronic Care Management Pharmacy Note  12/26/2020 Name:  Ashley Gross MRN:  [id number] DOB:  September 06, 1951  Subjective: Ashley Gross is an 70 y.o. year old female who is a primary patient of Einar Pheasant, MD.  The CCM team was consulted for assistance with disease management and care coordination needs.    Engaged with patient by telephone for follow up visit in response to provider referral for pharmacy case management and/or care coordination services.   Consent to Services:  The patient was given information about Chronic Care Management services, agreed to services, and gave verbal consent prior to initiation of services.  Please see initial visit note for detailed documentation.   Patient Care Team: Einar Pheasant, MD as PCP - General (Internal Medicine) De Hollingshead, RPH-CPP as Pharmacist (Pharmacist)  Recent office visits:  12/20/20- PCP visit for insomnia, mirtazapine started  Recent consult visits:  12/16/20- podiatry for foot care  Hospital visits: None in previous 6 months  Objective:  Lab Results  Component Value Date   CREATININE 0.97 11/13/2020   BUN 12 11/13/2020   GFR 59.50 (L) 11/13/2020   GFRNONAA 56 (L) 11/28/2019   GFRAA >60 11/28/2019   NA 140 11/13/2020   K 4.4 11/26/2020   CALCIUM 8.6 11/13/2020   CO2 31 11/13/2020    Lab Results  Component Value Date/Time   HGBA1C 5.4 11/13/2020 08:59 AM   HGBA1C 6.2 07/19/2020 09:13 AM   GFR 59.50 (L) 11/13/2020 08:59 AM   GFR 46.72 (L) 07/19/2020 09:13 AM   MICROALBUR 2.0 (H) 11/13/2020 08:59 AM   MICROALBUR 18.2 (H) 08/21/2019 08:30 AM    Last diabetic Eye exam:  Lab Results  Component Value Date/Time   HMDIABEYEEXA No Retinopathy 05/31/2020 12:00 AM    Last diabetic Foot exam:  Lab Results  Component Value Date/Time   HMDIABFOOTEX podiatry 04/22/2020 12:00 AM     Lab Results  Component Value Date   CHOL 114 11/13/2020   HDL 53.70 11/13/2020   LDLCALC 38 11/13/2020    LDLDIRECT 74.0 04/19/2019   TRIG 108.0 11/13/2020   CHOLHDL 2 11/13/2020    Hepatic Function Latest Ref Rng & Units 11/13/2020 07/19/2020 03/29/2020  Total Protein 6.0 - 8.3 g/dL 6.0 5.9(L) 6.5  Albumin 3.5 - 5.2 g/dL 4.0 3.7 4.0  AST 0 - 37 U/L _0 ALT 0 - 35 U/L _1 Alk Phosphatase 39 - 117 U/L 105 96 88  Total Bilirubin 0.2 - 1.2 mg/dL 0.6 0.5 0.4  Bilirubin, Direct 0.0 - 0.3 mg/dL 0.1 0.1 0.1    Lab Results  Component Value Date/Time   TSH 1.83 11/13/2020 08:59 AM   TSH 2.35 08/21/2019 08:30 AM    CBC Latest Ref Rng & Units 03/29/2020 04/19/2019 12/28/2018  WBC 4.0 - 10.5 K/uL 5.7 6.0 9.9  Hemoglobin 12.0 - 15.0 g/dL 12.3 12.9 12.2  Hematocrit 36.0 - 46.0 % 37.8 39.2 37.6  Platelets 150.0 - 400.0 K/uL 241.0 240.0 291    Lab Results  Component Value Date/Time   VD25OH 24.70 (L) 03/29/2020 09:41 AM   VD25OH 24 (L) 11/01/2018 10:35 AM   VD25OH 8.96 (L) 06/21/2018 09:58 AM    Clinical ASCVD: No  The ASCVD Risk score Mikey Bussing DC Jr., et al., 2013) failed to calculate for the following reasons:   The valid total cholesterol range is 130 to 320 mg/dL    Depression screen [facility] 2/9 03/05/2020 11/21/2019 07/20/2019  Decreased Interest 0 0 0  Down, Depressed, Hopeless 0 0 0  PHQ - 2 Score 0 0 0  Altered sleeping 0 0 -  Tired, decreased energy 0 0 -  Change in appetite 0 0 -  Feeling bad or failure about yourself  0 0 -  Trouble concentrating 0 0 -  Moving slowly or fidgety/restless 0 0 -  Suicidal thoughts 0 0 -  PHQ-9 Score 0 0 -  Difficult doing work/chores Not difficult at all Not difficult at all -  Some recent data might be hidden     Social History   Tobacco Use  Smoking Status Never Smoker  Smokeless Tobacco Never Used   BP Readings from Last 3 Encounters:  11/13/20 116/70  10/11/20 107/67  08/05/20 120/68   Pulse Readings from Last 3 Encounters:  11/13/20 87  10/11/20 70  08/05/20 66   Wt Readings from Last 3 Encounters:  12/20/20 154 lb (69.9 kg)   11/13/20 154 lb 3.2 oz (69.9 kg)  10/11/20 153 lb (69.4 kg)    Assessment/Interventions: Review of patient past medical history, allergies, medications, health status, including review of consultants reports, laboratory and other test data, was performed as part of comprehensive evaluation and provision of chronic care management services.   SDOH:  (Social Determinants of Health) assessments and interventions performed: Yes SDOH Interventions   Flowsheet Row Most Recent Value  SDOH Interventions   Financial Strain Interventions Other (Comment)  [manufacturer assistance]  Stress Interventions Other (Comment)  [provided empathetic listening]      CCM Care Plan  Allergies  Allergen Reactions  . Aspirin Other (See Comments)    Increased bleeding  . Beta Adrenergic Blockers   . Lisinopril     cough  . Mtx Support [Cobalamine Combinations] Other (See Comments)    Respiratory symptoms  . Vasotec [Enalapril] Cough  . Verapamil Other (See Comments)    Heart block  . Voltaren [Diclofenac Sodium]   . Erythromycin Other (See Comments)    Gi intolerance  . Indocin [Indomethacin]   . Jardiance [Empagliflozin] Other (See Comments)    Recurrent yeast infections, even with washout and rechallenge    Medications Reviewed Today    Reviewed by De Hollingshead, RPH-CPP (Pharmacist) on 12/26/20 at 40  Med List Status: <None>  Medication Order Taking? Sig Documenting Provider Last Dose Status Informant  amLODipine (NORVASC) 5 MG tablet [id number] Yes TAKE 1 TABLET(5 MG) BY MOUTH TWICE DAILY Einar Pheasant, MD Taking Active   budesonide-formoterol [facility]) 160-4.5 MCG/ACT inhaler [id number] Yes Inhale 2 puffs into the lungs 2 (two) times daily. Einar Pheasant, MD Taking Active   Calcium Carbonate-Vit D-Min (CALCIUM 600+D PLUS MINERALS) 600-400 MG-UNIT TABS [id number] Yes Take by mouth. [provider] Taking Active   Cholecalciferol (VITAMIN D-3) 1000 UNITS CAPS [id number] Yes  Take 1 capsule by mouth daily. [provider] Taking Active   cyclobenzaprine (FLEXERIL) 10 MG tablet [id number] Yes TAKE 1 TABLET BY MOUTH EVERY NIGHT AS NEEDED Einar Pheasant, MD Taking Active   insulin degludec (TRESIBA FLEXTOUCH) 100 UNIT/ML FlexTouch Pen [id number] Yes Inject 18 Units into the skin daily. Einar Pheasant, MD Taking Active   loperamide (IMODIUM) 2 MG capsule [id number] No Take 2 mg by mouth as needed.   Patient not taking: Reported on 12/26/2020   [provider] Not Taking Active            Med Note Nat Christen Dec 26, 2020  3:55 PM)    losartan (COZAAR) 100 MG  tablet [id number] Yes TAKE 1 TABLET BY MOUTH EVERY DAY Einar Pheasant, MD Taking Active   mirtazapine (REMERON) 15 MG tablet [id number] Yes 1/2 tablet q hs prn Einar Pheasant, MD Taking Active   omeprazole (PRILOSEC) 20 MG capsule [id number] Yes TAKE 1 CAPSULE(20 MG) BY MOUTH TWICE DAILY Einar Pheasant, MD Taking Active   ONE TOUCH ULTRA TEST test strip [id number] Yes PATIENT NEEDS NEW METER STRIPS AND LANCETS FOR ONE TOUCH. HER INSURANCE NO LONGER COVERS ACCUCHEK. Einar Pheasant, MD Taking Active   potassium chloride (KLOR-CON) 10 MEQ tablet [id number] Yes TAKE 1 TABLET(10 MEQ) BY MOUTH TWICE DAILY Einar Pheasant, MD Taking Active   rosuvastatin (CRESTOR) 10 MG tablet [id number] Yes TAKE 1 TABLET(10 MG) BY MOUTH DAILY Einar Pheasant, MD Taking Active   Semaglutide,0.25 or 0.5MG/DOS, (OZEMPIC, 0.25 OR 0.5 MG/DOSE,) 2 MG/1.5ML SOPN [id number] Yes Inject 0.5 mg weekly Einar Pheasant, MD Taking Active            Med Note Nat Christen Dec 26, 2020  6:75 PM) 4.49 + 10 clicks  triamterene-hydrochlorothiazide (MAXZIDE-25) 37.5-25 MG tablet [id number] Yes TAKE 1/2 TABLET BY MOUTH DAILY Einar Pheasant, MD Taking Active           Patient Active Problem List   Diagnosis Date Noted  . Humerus fracture 11/17/2020  . Rib pain on left side 08/05/2020  . Sleep difficulties  06/08/2020  . Swelling of lower extremity 11/26/2019  . Healthcare maintenance 09/13/2019  . Sinus congestion 02/11/2019  . B12 deficiency 02/11/2019  . Asthma 12/28/2018  . 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 Vitamin D deficiency 11/01/2018  . Tendinitis of upper biceps tendon of left shoulder 09/20/2018  . Mild depression ([place]) 09/15/2018  . Mild intermittent asthma without complication 97/58/8325  . Controlled type 2 diabetes mellitus with complication, with long-term current use of insulin ([place]) 09/15/2018  . 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 Traumatic incomplete tear of left rotator cuff 09/09/2018  . Bleeding internal hemorrhoids 09/13/2017  . 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 Rectal bleeding 07/15/2017  . Low back pain 03/29/2016  . Stress 12/12/2014  . Congestion of throat 01/06/2014  . Chronic diarrhea 02/16/2013  . Inflammatory polyps of colon ([place]) 02/16/2013  . Hypertension 10/06/2012  . GERD (gastroesophageal reflux disease) 10/06/2012  . Hypercholesterolemia 10/06/2012  . Psoriatic arthritis ([place]) 06/29/2012    Immunization History  Administered Date(s) Administered  . Fluad Quad(high Dose 65+) 07/20/2019, 07/23/2020  . Influenza Whole 07/03/2012  . Influenza, High Dose Seasonal PF 07/12/2017, 08/01/2018  . Influenza, Seasonal, Injecte, Preservative Fre 09/04/2013  . Influenza-Unspecified 07/17/2014, 07/31/2015, 07/15/2016  . PFIZER(Purple Top)SARS-COV-2 Vaccination 12/13/2019, 01/03/2020, 08/15/2020  . Pneumococcal Conjugate-13 05/17/2017  . Pneumococcal Polysaccharide-23 11/13/2020    Conditions to be addressed/monitored:  Hypertension, Hyperlipidemia and Diabetes  Care Plan : Medication Management  Updates made by De Hollingshead, RPH-CPP since 12/26/2020 12:00 AM    Problem: Diabetes, Hypertension, Hyperlipidemia     Long-Range Goal: Disease Progression Prevention    Recent Progress: On track  Priority: High  Note:   Current Barriers:  . Unable to independently afford treatment regimen  Pharmacist Clinical Goal(s):  Marland Kitchen Over the next 90 days, patient will verbalize ability to afford treatment regimen. . Over the next 90 days, patient will maintain control of diabetes as evidenced by improvement in A1c through collaboration with PharmD and provider.   Interventions: . 1:1 collaboration with Einar Pheasant, MD regarding  development and update of comprehensive plan of care as evidenced by provider attestation and co-signature . Inter-disciplinary care team collaboration (see longitudinal plan of care) . Comprehensive medication review performed; medication list updated in electronic medical record  SDOH: . Continuing to grieve her husband's passing. Endorses supportive family.   Diabetes: . Controlled; current treatment: Ozempic 0.25 mg weekly + 10 clicks (reduced from 0.5 mg d/t GI upset), Tresiba 18 units daily; o Hx recurrent UTIs on Jardiance,  . Denies any signs/symptoms of hypoglycemia.  . Current glucose readings: fastings 77-110s . Notes that she has not been eating as much lately. Recent addition of mirtazapine may help appetite.  . Continue current regimen at this time. Moving forward, if Ozempic creates too much of an impact on reducing appetite, consider change to Trulicity instead.  Marland Kitchen Approved for [employer] patient assistance through 2022.  Hypertension: . Controlled per last clinic reading; current treatment: amlodipine 5 mg BID, losartan 100 mg QPM, triamterene/HCTZ 37.5/25 1/2 tab QAM, potassium 10 mEq BID . Not checking at home. Continue to support benefit of occasional home BP monitoring. . Recommended to continue current regimen with collaboration with PCP   Hyperlipidemia: . Controlled per last lipid panel; current treatment: rosuvastatin 10 mg daily . Recommended to continue current regimen at this  time  Asthma: . Controlled per patient symptom report; current regimen: Symbicort 160/4.5 mcg 2 puffs BID. Marland Kitchen Approved for Symbicort assistance through 11/01/21. Marland Kitchen Continue current regimen at this time.  Grief/Insomnia: . Improved; current regimen: mirtazapine 15 mg daily (increased from 7.5 mg daily).  . Previously tried: hx melatonin, trazodone - no benefit . Endorses getting 6 hours of sleep nightly. Notes that her daughter was started on mirtazapine and her doctor is titrating to 8 hours of sleep nightly. Discussed that I would endorse continuing current regimen at this time, given recent dose increase and risk of combined sedation due to concomitant cyclobenzaprine use QPM. Patient verbalized understanding . Continue current regimen at this time. Will collaborate w/ provider to send updated script to reflect current dosing . Continue to follow for need for social/grief support  Patient Goals/Self-Care Activities . Over the next 90 days, patient will:  - take medications as prescribed check blood glucose periodically, document, and provide at future appointments check blood pressure periodically, document, and provide at future appointments collaborate with provider on medication access solutions  Follow Up Plan: Telephone follow up appointment with care management team member scheduled for: ~ 8 weeks      Medication Assistance: Ozempic, Tresiba obtained through [employer] medication assistance program.  Enrollment ends 10/01/21. Symbicort obtained through [employer] medication assistance program.  Enrollment ends 11/01/21  Patient's preferred pharmacy is:  [id number] - [address], [address] [place] [address] Phone: [phone] Fax: [phone]   Care Plan and Follow Up Patient Decision:  Patient agrees to Care Plan and Follow-up.  Plan: Telephone follow up appointment with care management team  member scheduled for:  ~ 8 weeks  [name] Darnelle Maffucci, PharmD, [place], [place] Clinical Pharmacist [employer] at [employer] [phone]

## 2020-12-27 DIAGNOSIS — M25612 Stiffness of left shoulder, not elsewhere classified: Secondary | ICD-10-CM | POA: Diagnosis not present

## 2020-12-27 DIAGNOSIS — M25512 Pain in left shoulder: Secondary | ICD-10-CM | POA: Diagnosis not present

## 2020-12-31 ENCOUNTER — Encounter: Payer: Self-pay | Admitting: Internal Medicine

## 2021-01-01 NOTE — Telephone Encounter (Signed)
LMTCB

## 2021-01-01 NOTE — Telephone Encounter (Signed)
If she is taking remeron 15mg  and taking 1 1/2 tablet q hs and tolerating, I am ok to increase to 30mg  q hs, but I want her to avoid taking the cyclobenzaprine (muscle relaxer or any other sedating medication -  with it). I would hold (in her) increasing past 30mg  before bed.   I can send in rx if she desires.

## 2021-01-02 ENCOUNTER — Other Ambulatory Visit: Payer: Self-pay

## 2021-01-02 MED ORDER — MIRTAZAPINE 15 MG PO TABS: ORAL_TABLET | ORAL | 1 refills | Status: DC

## 2021-01-02 NOTE — Telephone Encounter (Signed)
Pt aware and rx sent in

## 2021-01-07 ENCOUNTER — Ambulatory Visit (INDEPENDENT_AMBULATORY_CARE_PROVIDER_SITE_OTHER): Payer: PPO

## 2021-01-07 VITALS — Ht 64.0 in | Wt 154.0 lb

## 2021-01-07 DIAGNOSIS — Z0000 Encounter for general adult medical examination without abnormal findings: Secondary | ICD-10-CM

## 2021-01-07 NOTE — Patient Instructions (Addendum)
Ashley Gross , Thank you for taking time to come for your Medicare Wellness Visit. I appreciate your ongoing commitment to your health goals. Please review the following plan we discussed and let me know if I can assist you in the future.   These are the goals we discussed:  Goals: Follow up with pcp as scheduled.    This is a list of the screening recommended for you and due dates:  Health Maintenance  Topic Date Due  . Tetanus Vaccine  Never done  . Colon Cancer Screening  01/04/2020  .  Hepatitis C: One time screening is recommended by Center for Disease Control  (CDC) for  adults born from [birth year] through 1965.   01/07/2022*  . Complete foot exam   04/22/2021  . Hemoglobin A1C  05/13/2021  . Eye exam for diabetics  05/31/2021  . Mammogram  09/12/2021  . Flu Shot  Completed  . DEXA scan (bone density measurement)  Completed  . COVID-19 Vaccine  Completed  . Pneumonia vaccines  Completed  . HPV Vaccine  Aged Out  *Topic was postponed. The date shown is not the original due date.    Immunizations Immunization History  Administered Date(s) Administered  . Fluad Quad(high Dose 65+) 07/20/2019, 07/23/2020  . Influenza Whole 07/03/2012  . Influenza, High Dose Seasonal PF 07/12/2017, 08/01/2018  . Influenza, Seasonal, Injecte, Preservative Fre 09/04/2013  . Influenza-Unspecified 07/17/2014, 07/31/2015, 07/15/2016  . PFIZER(Purple Top)SARS-COV-2 Vaccination 12/13/2019, 01/03/2020, 08/15/2020  . Pneumococcal Conjugate-13 05/17/2017  . Pneumococcal Polysaccharide-23 11/13/2020   Keep all routine maintenance appointments.   CCM 02/21/21 @ 1:00  Follow up 03/13/21 @ 2:00  Advanced directives: not yet completed  Conditions/risks identified: none new  Follow up in one year for your annual wellness visit   Preventive Care 65 Years and Older, Female Preventive care refers to lifestyle choices and visits with your health care provider that can promote health and wellness. What does  preventive care include?  A yearly physical exam. This is also called an annual well check.  Dental exams once or twice a year.  Routine eye exams. Ask your health care provider how often you should have your eyes checked.  Personal lifestyle choices, including:  Daily care of your teeth and gums.  Regular physical activity.  Eating a healthy diet.  Avoiding tobacco and drug use.  Limiting alcohol use.  Practicing safe sex.  Taking low-dose aspirin every day.  Taking vitamin and mineral supplements as recommended by your health care provider. What happens during an annual well check? The services and screenings done by your health care provider during your annual well check will depend on your age, overall health, lifestyle risk factors, and family history of disease. Counseling  Your health care provider may ask you questions about your:  Alcohol use.  Tobacco use.  Drug use.  Emotional well-being.  Home and relationship well-being.  Sexual activity.  Eating habits.  History of falls.  Memory and ability to understand (cognition).  Work and work Statistician.  Reproductive health. Screening  You may have the following tests or measurements:  Height, weight, and BMI.  Blood pressure.  Lipid and cholesterol levels. These may be checked every 5 years, or more frequently if you are over 66 years old.  Skin check.  Lung cancer screening. You may have this screening every year starting at age 22 if you have a 30-pack-year history of smoking and currently smoke or have quit within the past 15 years.  Fecal  occult blood test (FOBT) of the stool. You may have this test every year starting at age 58.  Flexible sigmoidoscopy or colonoscopy. You may have a sigmoidoscopy every 5 years or a colonoscopy every 10 years starting at age 71.  Hepatitis C blood test.  Hepatitis B blood test.  Sexually transmitted disease (STD) testing.  Diabetes screening. This  is done by checking your blood sugar (glucose) after you have not eaten for a while (fasting). You may have this done every 1-3 years.  Bone density scan. This is done to screen for osteoporosis. You may have this done starting at age 46.  Mammogram. This may be done every 1-2 years. Talk to your health care provider about how often you should have regular mammograms. Talk with your health care provider about your test results, treatment options, and if necessary, the need for more tests. Vaccines  Your health care provider may recommend certain vaccines, such as:  Influenza vaccine. This is recommended every year.  Tetanus, diphtheria, and acellular pertussis (Tdap, Td) vaccine. You may need a Td booster every 10 years.  Zoster vaccine. You may need this after age 39.  Pneumococcal 13-valent conjugate (PCV13) vaccine. One dose is recommended after age 58.  Pneumococcal polysaccharide (PPSV23) vaccine. One dose is recommended after age 68. Talk to your health care provider about which screenings and vaccines you need and how often you need them. This information is not intended to replace advice given to you by your health care provider. Make sure you discuss any questions you have with your health care provider. Document Released: 11/15/2015 Document Revised: 07/08/2016 Document Reviewed: 08/20/2015 Elsevier Interactive Patient Education  2017 [place] Prevention in the Home Falls can cause injuries. They can happen to people of all ages. There are many things you can do to make your home safe and to help prevent falls. What can I do on the outside of my home?  Regularly fix the edges of walkways and driveways and fix any cracks.  Remove anything that might make you trip as you walk through a door, such as a raised step or threshold.  Trim any bushes or trees on the path to your home.  Use bright outdoor lighting.  Clear any walking paths of anything that might make  someone trip, such as rocks or tools.  Regularly check to see if handrails are loose or broken. Make sure that both sides of any steps have handrails.  Any raised decks and porches should have guardrails on the edges.  Have any leaves, snow, or ice cleared regularly.  Use sand or salt on walking paths during winter.  Clean up any spills in your garage right away. This includes oil or grease spills. What can I do in the bathroom?  Use night lights.  Install grab bars by the toilet and in the tub and shower. Do not use towel bars as grab bars.  Use non-skid mats or decals in the tub or shower.  If you need to sit down in the shower, use a plastic, non-slip stool.  Keep the floor dry. Clean up any water that spills on the floor as soon as it happens.  Remove soap buildup in the tub or shower regularly.  Attach bath mats securely with double-sided non-slip rug tape.  Do not have throw rugs and other things on the floor that can make you trip. What can I do in the bedroom?  Use night lights.  Make sure that you  have a light by your bed that is easy to reach.  Do not use any sheets or blankets that are too big for your bed. They should not hang down onto the floor.  Have a firm chair that has side arms. You can use this for support while you get dressed.  Do not have throw rugs and other things on the floor that can make you trip. What can I do in the kitchen?  Clean up any spills right away.  Avoid walking on wet floors.  Keep items that you use a lot in easy-to-reach places.  If you need to reach something above you, use a strong step stool that has a grab bar.  Keep electrical cords out of the way.  Do not use floor polish or wax that makes floors slippery. If you must use wax, use non-skid floor wax.  Do not have throw rugs and other things on the floor that can make you trip. What can I do with my stairs?  Do not leave any items on the stairs.  Make sure that  there are handrails on both sides of the stairs and use them. Fix handrails that are broken or loose. Make sure that handrails are as long as the stairways.  Check any carpeting to make sure that it is firmly attached to the stairs. Fix any carpet that is loose or worn.  Avoid having throw rugs at the top or bottom of the stairs. If you do have throw rugs, attach them to the floor with carpet tape.  Make sure that you have a light switch at the top of the stairs and the bottom of the stairs. If you do not have them, ask someone to add them for you. What else can I do to help prevent falls?  Wear shoes that:  Do not have high heels.  Have rubber bottoms.  Are comfortable and fit you well.  Are closed at the toe. Do not wear sandals.  If you use a stepladder:  Make sure that it is fully opened. Do not climb a closed stepladder.  Make sure that both sides of the stepladder are locked into place.  Ask someone to hold it for you, if possible.  Clearly mark and make sure that you can see:  Any grab bars or handrails.  First and last steps.  Where the edge of each step is.  Use tools that help you move around (mobility aids) if they are needed. These include:  Canes.  Walkers.  Scooters.  Crutches.  Turn on the lights when you go into a dark area. Replace any light bulbs as soon as they burn out.  Set up your furniture so you have a clear path. Avoid moving your furniture around.  If any of your floors are uneven, fix them.  If there are any pets around you, be aware of where they are.  Review your medicines with your doctor. Some medicines can make you feel dizzy. This can increase your chance of falling. Ask your doctor what other things that you can do to help prevent falls. This information is not intended to replace advice given to you by your health care provider. Make sure you discuss any questions you have with your health care provider. Document Released:  08/15/2009 Document Revised: 03/26/2016 Document Reviewed: 11/23/2014 Elsevier Interactive Patient Education  2017 [employer].

## 2021-01-07 NOTE — Progress Notes (Signed)
Subjective:   Ashley Gross is a 70 y.o. female who presents for Medicare Annual (Subsequent) preventive examination.  Review of Systems    No ROS.  Medicare Wellness Virtual Visit.         Objective:    Today's Vitals   01/07/21 1407  Weight: 154 lb (69.9 kg)  Height: 5\' 4"  (1.626 m)   Body mass index is 26.43 kg/m.  Advanced Directives 01/07/2021 10/11/2020 01/04/2019 12/21/2018 09/20/2018 09/16/2018 12/27/2017  Does Patient Have a Medical Advance Directive? No No No No No No No  Would patient like information on creating a medical advance directive? No - Patient declined No - Patient declined No - Patient declined No - Patient declined No - Patient declined No - Patient declined No - Patient declined    Current Medications (verified) Outpatient Encounter Medications as of 01/07/2021  Medication Sig  . acetaminophen (TYLENOL) 325 MG tablet Take 650 mg by mouth every 6 (six) hours as needed.  Marland Kitchen amLODipine (NORVASC) 5 MG tablet TAKE 1 TABLET(5 MG) BY MOUTH TWICE DAILY  . budesonide-formoterol (SYMBICORT) 160-4.5 MCG/ACT inhaler Inhale 2 puffs into the lungs 2 (two) times daily.  . Calcium Carbonate-Vit D-Min (CALCIUM 600+D PLUS MINERALS) 600-400 MG-UNIT TABS Take by mouth.  . Cholecalciferol (VITAMIN D-3) 1000 UNITS CAPS Take 1 capsule by mouth daily.  . cyclobenzaprine (FLEXERIL) 10 MG tablet TAKE 1 TABLET BY MOUTH EVERY NIGHT AS NEEDED  . insulin degludec (TRESIBA FLEXTOUCH) 100 UNIT/ML FlexTouch Pen Inject 18 Units into the skin daily.  Marland Kitchen loperamide (IMODIUM) 2 MG capsule Take 2 mg by mouth as needed.  (Patient not taking: Reported on 12/26/2020)  . losartan (COZAAR) 100 MG tablet TAKE 1 TABLET BY MOUTH EVERY DAY  . mirtazapine (REMERON) 15 MG tablet Take 1-2 tablets by mouth q hs  . omeprazole (PRILOSEC) 20 MG capsule TAKE 1 CAPSULE(20 MG) BY MOUTH TWICE DAILY  . ONE TOUCH ULTRA TEST test strip PATIENT NEEDS NEW METER STRIPS AND LANCETS FOR ONE TOUCH. HER INSURANCE NO LONGER  COVERS ACCUCHEK.  Marland Kitchen potassium chloride (KLOR-CON) 10 MEQ tablet TAKE 1 TABLET(10 MEQ) BY MOUTH TWICE DAILY  . rosuvastatin (CRESTOR) 10 MG tablet TAKE 1 TABLET(10 MG) BY MOUTH DAILY  . Semaglutide,0.25 or 0.5MG /DOS, (OZEMPIC, 0.25 OR 0.5 MG/DOSE,) 2 MG/1.5ML SOPN Inject 0.5 mg weekly  . triamterene-hydrochlorothiazide (MAXZIDE-25) 37.5-25 MG tablet TAKE 1/2 TABLET BY MOUTH DAILY   No facility-administered encounter medications on file as of 01/07/2021.    Allergies (verified) Aspirin, Beta adrenergic blockers, Lisinopril, Mtx support [cobalamine combinations], Vasotec [enalapril], Verapamil, Voltaren [diclofenac sodium], Erythromycin, Indocin [indomethacin], and Jardiance [empagliflozin]   History: Past Medical History:  Diagnosis Date  . Abnormal liver function test 10/06/2012  . Anemia, unspecified   . Asthma   . Colitis   . Complication of anesthesia    asthma attack after shoulder surgery  . Diabetes mellitus ([place])   . Esophagitis   . H/O hiatal hernia   . Heart murmur   . Hx of arteriovenous malformation (AVM)   . Hypertension   . IBS (irritable bowel syndrome)   . Nephrolithiasis   . Pernicious anemia   . Personal history of colonic polyps 02/10/2013   02/08/13 colonoscopy - question of rectal varices, two 3-51mm polyps in the sigmoid colon, mass at the hepatic flexure, one 32mm polyp at the hepatic flexure, nodule at the ileocecal valve, congested mucusa in the entire colon, flattened villi mucosa in the terminal ileum.    . Pneumonia   .  Psoriatic arthritis (HCC)    s/p penicillamine, plaquenil, MTX, sulfasalazine.  s/p Embrel, Humira,.  Iritis.    . Pure hypercholesterolemia    Past Surgical History:  Procedure Laterality Date  . ABDOMINAL HYSTERECTOMY  1981   ovaries left in place  . ANKLE SURGERY     right  . BUNIONECTOMY    . CHOLECYSTECTOMY  1989  . COLONOSCOPY WITH PROPOFOL N/A 12/27/2017   Procedure: COLONOSCOPY WITH PROPOFOL;  Surgeon: Lin Landsman, MD;   Location: [facility] ENDOSCOPY;  Service: Gastroenterology;  Laterality: N/A;  . COLONOSCOPY WITH PROPOFOL N/A 01/04/2019   Procedure: COLONOSCOPY WITH PROPOFOL;  Surgeon: Lin Landsman, MD;  Location: [facility];  Service: Endoscopy;  Laterality: N/A;  Diabetic - oral and injectable  . ESOPHAGOGASTRODUODENOSCOPY (EGD) WITH PROPOFOL N/A 12/27/2017   Procedure: ESOPHAGOGASTRODUODENOSCOPY (EGD) WITH PROPOFOL;  Surgeon: Lin Landsman, MD;  Location: [place];  Service: Gastroenterology;  Laterality: N/A;  . HAND SURGERY     right  . HEEL SPUR SURGERY    . NOSE SURGERY     x2  . POLYPECTOMY  01/04/2019   Procedure: POLYPECTOMY;  Surgeon: Lin Landsman, MD;  Location: [place];  Service: Endoscopy;;  . SHOULDER ARTHROSCOPY WITH OPEN ROTATOR CUFF REPAIR Left 09/20/2018   Procedure: SHOULDER ARTHROSCOPY WITH OPEN ROTATOR CUFF REPAIR;  Surgeon: Corky Mull, MD;  Location: [facility];  Service: Orthopedics;  Laterality: Left;  . SHOULDER CLOSED REDUCTION Left 12/21/2018   Procedure: MANIPULATION UNDER ANESTHESIA WITH STEROID INJECTION;  Surgeon: Corky Mull, MD;  Location: [place];  Service: Orthopedics;  Laterality: Left;  Diabetic - insulin and oral meds  . UMBILICAL HERNIA REPAIR     Family History  Problem Relation Age of Onset  . Diabetes Other   . Hypertension Other   . Breast cancer Neg Hx   . Colon cancer Neg Hx    Social History   Socioeconomic History  . Marital status: Married    Spouse name: Not on file  . Number of children: 2  . Years of education: Not on file  . Highest education level: Not on file  Occupational History  . Not on file  Tobacco Use  . Smoking status: Never Smoker  . Smokeless tobacco: Never Used  Vaping Use  . Vaping Use: Never used  Substance and Sexual Activity  . Alcohol use: No    Alcohol/week: 0.0 standard drinks  . Drug use: No  . Sexual activity: Not Currently  Other Topics Concern  . Not on file   Social History Narrative   She is married and has two children   Social Determinants of Health   Financial Resource Strain: Medium Risk  . Difficulty of Paying Living Expenses: Somewhat hard  Food Insecurity: No Food Insecurity  . Worried About Charity fundraiser in the Last Year: Never true  . Ran Out of Food in the Last Year: Never true  Transportation Needs: No Transportation Needs  . Lack of Transportation (Medical): No  . Lack of Transportation (Non-Medical): No  Physical Activity: Not on file  Stress: Stress Concern Present  . Feeling of Stress : To some extent  Social Connections: Unknown  . Frequency of Communication with Friends and Family: More than three times a week  . Frequency of Social Gatherings with Friends and Family: Not on file  . Attends Religious Services: Not on file  . Active Member of Clubs or Organizations: Not on file  .  Attends Archivist Meetings: Not on file  . Marital Status: Not on file    Tobacco Counseling Counseling given: Not Answered   Clinical Intake:  Pre-visit preparation completed: Yes        Diabetes: Yes (Followed by pcp)  How often do you need to have someone help you when you read instructions, pamphlets, or other written materials from your doctor or pharmacy?: 1 - Never    Interpreter Needed?: No      Activities of Daily Living In your present state of health, do you have any difficulty performing the following activities: 01/07/2021  Hearing? N  Vision? N  Difficulty concentrating or making decisions? N  Walking or climbing stairs? N  Dressing or bathing? N  Doing errands, shopping? N  Preparing Food and eating ? N  Using the Toilet? N  In the past six months, have you accidently leaked urine? N  Do you have problems with loss of bowel control? N  Managing your Medications? N  Managing your Finances? N  Housekeeping or managing your Housekeeping? N  Some recent data might be hidden    Patient  Care Team: Einar Pheasant, MD as PCP - General (Internal Medicine) De Hollingshead, RPH-CPP as Pharmacist (Pharmacist)  Indicate any recent Medical Services you may have received from other than [facility] providers in the past year (date may be approximate).     Assessment:   This is a routine wellness examination for Ashley Gross.  I connected with Ashley Gross today by telephone and verified that I am speaking with the correct person using two identifiers. Location patient: home Location provider: work Persons participating in the virtual visit: patient, Marine scientist.    I discussed the limitations, risks, security and privacy concerns of performing an evaluation and management service by telephone and the availability of in person appointments. I also discussed with the patient that there may be a patient responsible charge related to this service. The patient expressed understanding and verbally consented to this telephonic visit.    Interactive audio and video telecommunications were attempted between this provider and patient, however failed, due to patient having technical difficulties OR patient did not have access to video capability.  We continued and completed visit with audio only.  Some vital signs may be absent or patient reported.   Hearing/Vision screen  Hearing Screening   125Hz  250Hz  500Hz  1000Hz  2000Hz  3000Hz  4000Hz  6000Hz  8000Hz   Right ear:           Left ear:           Comments: Patient is able to hear conversational tones without difficulty.  No issues reported.  Vision Screening Comments: Followed by [facility] (Dr. Jeni Salles)  Wears corrective lenses  Cataract extraction, bilateral Virtual visit   Dietary issues and exercise activities discussed: Current Exercise Habits: Home exercise routine  Goals: Follow up with pcp as scheduled.   Depression Screen PHQ 2/9 Scores 01/07/2021 03/05/2020 11/21/2019 07/20/2019 05/17/2017 10/09/2016 09/22/2016  PHQ - 2 Score 0 0 0 0 0  0 0  PHQ- 9 Score - 0 0 - - - -    Fall Risk Fall Risk  01/07/2021 07/23/2020 07/20/2019 05/17/2017 10/09/2016  Falls in the past year? 0 0 0 No No  Number falls in past yr: 0 - 0 - -  Injury with Fall? 0 - - - -  Follow up Falls evaluation completed - Falls evaluation completed - -    FALL RISK PREVENTION PERTAINING TO THE HOME:  Handrails in use when climbing stairs? Yes Home free of loose throw rugs in walkways, pet beds, electrical cords, etc? Yes  Adequate lighting in your home to reduce risk of falls? Yes   ASSISTIVE DEVICES UTILIZED TO PREVENT FALLS: Use of a cane, walker or w/c? No   TIMED UP AND GO: Was the test performed? No . Virtual visit.  Cognitive Function: Patient is alert and oriented x3. Denies difficulty, making decisions, memory loss.  MMSE/6CIT deferred. Normal by direct communication/observation. MMSE - Mini Mental State Exam 05/17/2017  Orientation to time 5  Orientation to Place 5  Registration 3  Attention/ Calculation 5  Recall 3  Language- name 2 objects 2  Language- repeat 1  Language- follow 3 step command 3  Language- read & follow direction 1  Write a sentence 1  Copy design 1  Total score 30       Immunizations Immunization History  Administered Date(s) Administered  . Fluad Quad(high Dose 65+) 07/20/2019, 07/23/2020  . Influenza Whole 07/03/2012  . Influenza, High Dose Seasonal PF 07/12/2017, 08/01/2018  . Influenza, Seasonal, Injecte, Preservative Fre 09/04/2013  . Influenza-Unspecified 07/17/2014, 07/31/2015, 07/15/2016  . PFIZER(Purple Top)SARS-COV-2 Vaccination 12/13/2019, 01/03/2020, 08/15/2020  . Pneumococcal Conjugate-13 05/17/2017  . Pneumococcal Polysaccharide-23 11/13/2020   TDAP status: Due, Education has been provided regarding the importance of this vaccine. Advised may receive this vaccine at local pharmacy or Health Dept. Aware to provide a copy of the vaccination record if obtained from local pharmacy or Health Dept.  Verbalized acceptance and understanding. Deferred.   Health Maintenance Health Maintenance  Topic Date Due  . TETANUS/TDAP  Never done  . COLONOSCOPY (Pts 45-47yrs Insurance coverage will need to be confirmed)  01/04/2020  . Hepatitis C Screening  01/07/2022 (Originally Mar 30, 1951)  . FOOT EXAM  04/22/2021  . HEMOGLOBIN A1C  05/13/2021  . OPHTHALMOLOGY EXAM  05/31/2021  . MAMMOGRAM  09/12/2021  . INFLUENZA VACCINE  Completed  . DEXA SCAN  Completed  . COVID-19 Vaccine  Completed  . PNA vac Low Risk Adult  Completed  . HPV VACCINES  Aged Out   Colonoscopy- ordering deferred per patient preference to speak with pcp.  Mammogram status: Completed 09/13/19. Repeat every year  Lung Cancer Screening: (Low Dose CT Chest recommended if Age 67-80 years, 30 pack-year currently smoking OR have quit w/in 15years.) does not qualify.    Hepatitis C Screening: deferred by patient.   Vision Screening: Recommended annual ophthalmology exams for early detection of glaucoma and other disorders of the eye. Is the patient up to date with their annual eye exam?  Yes   Dental Screening: Recommended annual dental exams for proper oral hygiene.  Community Resource Referral / Chronic Care Management: CRR required this visit?  No   CCM required this visit?  No      Plan:   Keep all routine maintenance appointments.   CCM 02/21/21 @ 1:00  Follow up 03/13/21 @ 2:00  I have personally reviewed and noted the following in the patient's chart:   . Medical and social history . Use of alcohol, tobacco or illicit drugs  . Current medications and supplements . Functional ability and status . Nutritional status . Physical activity . Advanced directives . List of other physicians . Hospitalizations, surgeries, and ER visits in previous 12 months . Vitals . Screenings to include cognitive, depression, and falls . Referrals and appointments  In addition, I have reviewed and discussed with patient  certain preventive protocols, quality metrics, and  best practice recommendations. A written personalized care plan for preventive services as well as general preventive health recommendations were provided to patient via mychart.     Varney Biles, LPN   02/07/760

## 2021-01-08 DIAGNOSIS — M25512 Pain in left shoulder: Secondary | ICD-10-CM | POA: Diagnosis not present

## 2021-01-08 DIAGNOSIS — M25612 Stiffness of left shoulder, not elsewhere classified: Secondary | ICD-10-CM | POA: Diagnosis not present

## 2021-01-09 ENCOUNTER — Telehealth: Payer: Self-pay

## 2021-01-09 NOTE — Telephone Encounter (Signed)
Ozempic received, labeled and placed In fridge for pick up. Patient is aware.

## 2021-01-10 DIAGNOSIS — M25612 Stiffness of left shoulder, not elsewhere classified: Secondary | ICD-10-CM | POA: Diagnosis not present

## 2021-01-10 DIAGNOSIS — M25512 Pain in left shoulder: Secondary | ICD-10-CM | POA: Diagnosis not present

## 2021-01-13 ENCOUNTER — Telehealth: Payer: Self-pay | Admitting: *Deleted

## 2021-01-13 NOTE — Chronic Care Management (AMB) (Deleted)
°  Care Management   Outreach Note  01/13/2021 Name: Ashley Gross MRN: [id number] DOB: [birth year]  Referred by: Einar Pheasant, MD Reason for referral : Care Coordination (Initial outreach to reschedule referral with Pharm D)   An unsuccessful telephone outreach was attempted today. The patient was referred to the case management team for assistance with care management and care coordination.   Follow Up Plan: We have been unable to make contact with the patient for follow up. The care management team is available to follow up with the patient after provider conversation with the patient regarding recommendation for care management engagement and subsequent re-referral to the care management team.  A HIPAA compliant phone message was left for the patient providing contact information and requesting a return call.  If patient returns call to provider office, please advise to call [place] [facility] Morales at [facility]

## 2021-01-13 NOTE — Chronic Care Management (AMB) (Signed)
  Care Management   Note  01/13/2021 Name: [name] MRN: [id number] DOB: 1951-02-13  Ashley Gross is a 70 y.o. year old female who is a primary care patient of Einar Pheasant, MD and is actively engaged with the care management team. I reached out to Ashley Gross by phone today to assist with re-scheduling a follow up visit with the Pharmacist  Follow up plan: Unsuccessful telephone outreach attempt made. A HIPAA compliant phone message was left for the patient providing contact information and requesting a return call.  The care management team will reach out to the patient again over the next 7 days.  If patient returns call to provider office, please advise to call [place] [facility] Morales at [facility]

## 2021-01-17 NOTE — Chronic Care Management (AMB) (Signed)
  Care Management   Note  01/17/2021 Name: Ashley Gross MRN: [id number] DOB: 13-May-1951  Ashley Gross is a 70 y.o. year old female who is a primary care patient of Einar Pheasant, MD and is actively engaged with the care management team. I reached out to Ashley Gross by phone today to assist with re-scheduling a follow up visit with the Pharmacist  Follow up plan: Telephone appointment with care management team member scheduled for: 02/24/2021  [facility]

## 2021-01-28 ENCOUNTER — Other Ambulatory Visit: Payer: Self-pay | Admitting: Internal Medicine

## 2021-02-18 ENCOUNTER — Telehealth: Payer: Self-pay

## 2021-02-18 NOTE — Telephone Encounter (Addendum)
Received patient assistance medication. Pt is aware that it is ready to be picked up.   Ashley Gross: 2 boxes

## 2021-02-20 NOTE — Telephone Encounter (Signed)
Pt picked up medication.

## 2021-02-21 ENCOUNTER — Telehealth: Payer: PPO

## 2021-02-23 NOTE — Progress Notes (Signed)
DM controlled - BG? Lows? Appetite better? Added mirtazapine Weight? Insomnia better since increasing mirtazapine to 30 mg daily? BP readings? Not checking at home  Breathing issues? Asthma attacks? Controlled?

## 2021-02-24 ENCOUNTER — Ambulatory Visit (INDEPENDENT_AMBULATORY_CARE_PROVIDER_SITE_OTHER): Payer: PPO | Admitting: Pharmacist

## 2021-02-24 DIAGNOSIS — E7800 Pure hypercholesterolemia, unspecified: Secondary | ICD-10-CM | POA: Diagnosis not present

## 2021-02-24 DIAGNOSIS — J4520 Mild intermittent asthma, uncomplicated: Secondary | ICD-10-CM

## 2021-02-24 DIAGNOSIS — E1121 Type 2 diabetes mellitus with diabetic nephropathy: Secondary | ICD-10-CM

## 2021-02-24 DIAGNOSIS — F32A Depression, unspecified: Secondary | ICD-10-CM

## 2021-02-24 DIAGNOSIS — F320 Major depressive disorder, single episode, mild: Secondary | ICD-10-CM | POA: Diagnosis not present

## 2021-02-24 DIAGNOSIS — I10 Essential (primary) hypertension: Secondary | ICD-10-CM

## 2021-02-24 NOTE — Chronic Care Management (AMB) (Signed)
Chronic Care Management Pharmacy Note  02/24/2021 Name:  Ashley Gross MRN:  [id number] DOB:  Mar 01, 1951  Subjective: Ashley Gross is an 70 y.o. year old female who is a primary patient of Einar Pheasant, MD.  The CCM team was consulted for assistance with disease management and care coordination needs.    Engaged with patient by telephone for follow up visit in response to provider referral for pharmacy case management and/or care coordination services.   Consent to Services:  The patient was given information about Chronic Care Management services, agreed to services, and gave verbal consent prior to initiation of services.  Please see initial visit note for detailed documentation.   Patient Care Team: Einar Pheasant, MD as PCP - General (Internal Medicine) De Hollingshead, RPH-CPP as Pharmacist (Pharmacist)  Recent office visits: None since our last call  Recent consult visits: None since our last call  Hospital visits: None in previous 6 months  Objective:  Lab Results  Component Value Date   CREATININE 0.97 11/13/2020   CREATININE 1.15 07/19/2020   CREATININE 1.06 03/29/2020    Lab Results  Component Value Date   HGBA1C 5.4 11/13/2020   Last diabetic Eye exam:  Lab Results  Component Value Date/Time   HMDIABEYEEXA No Retinopathy 05/31/2020 12:00 AM    Last diabetic Foot exam:  Lab Results  Component Value Date/Time   HMDIABFOOTEX podiatry 04/22/2020 12:00 AM        Component Value Date/Time   CHOL 114 11/13/2020 0859   TRIG 108.0 11/13/2020 0859   HDL 53.70 11/13/2020 0859   CHOLHDL 2 11/13/2020 0859   VLDL 21.6 11/13/2020 0859   LDLCALC 38 11/13/2020 0859   LDLCALC 56 11/01/2018 1035   LDLDIRECT 74.0 04/19/2019 1056    Hepatic Function Latest Ref Rng & Units 11/13/2020 07/19/2020 03/29/2020  Total Protein 6.0 - 8.3 g/dL 6.0 5.9(L) 6.5  Albumin 3.5 - 5.2 g/dL 4.0 3.7 4.0  AST 0 - 37 U/L '16 16 18  ' ALT 0 - 35 U/L '8 11 12  ' Alk  Phosphatase 39 - 117 U/L 105 96 88  Total Bilirubin 0.2 - 1.2 mg/dL 0.6 0.5 0.4  Bilirubin, Direct 0.0 - 0.3 mg/dL 0.1 0.1 0.1    Lab Results  Component Value Date/Time   TSH 1.83 11/13/2020 08:59 AM   TSH 2.35 08/21/2019 08:30 AM    CBC Latest Ref Rng & Units 03/29/2020 04/19/2019 12/28/2018  WBC 4.0 - 10.5 K/uL 5.7 6.0 9.9  Hemoglobin 12.0 - 15.0 g/dL 12.3 12.9 12.2  Hematocrit 36.0 - 46.0 % 37.8 39.2 37.6  Platelets 150.0 - 400.0 K/uL 241.0 240.0 291    Lab Results  Component Value Date/Time   VD25OH 24.70 (L) 03/29/2020 09:41 AM   VD25OH 24 (L) 11/01/2018 10:35 AM   VD25OH 8.96 (L) 06/21/2018 09:58 AM    Clinical ASCVD: No  The ASCVD Risk score ([facility]., et al., 2013) failed to calculate for the following reasons:   The valid total cholesterol range is 130 to 320 mg/dL     Social History   Tobacco Use  Smoking Status Never Smoker  Smokeless Tobacco Never Used   BP Readings from Last 3 Encounters:  11/13/20 116/70  10/11/20 107/67  08/05/20 120/68   Pulse Readings from Last 3 Encounters:  11/13/20 87  10/11/20 70  08/05/20 66   Wt Readings from Last 3 Encounters:  01/07/21 154 lb (69.9 kg)  12/20/20 154 lb (69.9 kg)  11/13/20  154 lb 3.2 oz (69.9 kg)    Assessment: Review of patient past medical history, allergies, medications, health status, including review of consultants reports, laboratory and other test data, was performed as part of comprehensive evaluation and provision of chronic care management services.   SDOH:  (Social Determinants of Health) assessments and interventions performed:  SDOH Interventions   Flowsheet Row Most Recent Value  SDOH Interventions   Financial Strain Interventions Other (Comment)  [patient assistance]  Stress Interventions Other (Comment)  [provided empathetic listening]      CCM Care Plan  Allergies  Allergen Reactions  . Aspirin Other (See Comments)    Increased bleeding  . Beta Adrenergic Blockers   .  Lisinopril     cough  . Mtx Support [Cobalamine Combinations] Other (See Comments)    Respiratory symptoms  . Vasotec [Enalapril] Cough  . Verapamil Other (See Comments)    Heart block  . Voltaren [Diclofenac Sodium]   . Erythromycin Other (See Comments)    Gi intolerance  . Indocin [Indomethacin]   . Jardiance [Empagliflozin] Other (See Comments)    Recurrent yeast infections, even with washout and rechallenge    Medications Reviewed Today    Reviewed by De Hollingshead, RPH-CPP (Pharmacist) on 02/24/21 at [facility] List Status: <None>  Medication Order Taking? Sig Documenting Provider Last Dose Status Informant  acetaminophen (TYLENOL) 325 MG tablet [id number] Yes Take 650 mg by mouth every 6 (six) hours as needed. [provider] Taking Active            Med Note Nat Christen Dec 26, 2020  3:57 PM) Using while at work  amLODipine (NORVASC) 5 MG tablet [id number] Yes TAKE 1 TABLET(5 MG) BY MOUTH TWICE DAILY Einar Pheasant, MD Taking Active   budesonide-formoterol [facility]) 160-4.5 MCG/ACT inhaler [id number] Yes Inhale 2 puffs into the lungs 2 (two) times daily. Einar Pheasant, MD Taking Active   Calcium Carbonate-Vit D-Min (CALCIUM 600+D PLUS MINERALS) 600-400 MG-UNIT TABS [id number] Yes Take by mouth. [provider] Taking Active   Cholecalciferol (VITAMIN D-3) 1000 UNITS CAPS [id number] Yes Take 2 capsules by mouth daily. [provider] Taking Active   cyclobenzaprine (FLEXERIL) 10 MG tablet [id number] Yes TAKE 1 TABLET BY MOUTH EVERY NIGHT AS NEEDED Einar Pheasant, MD Taking Active            Med Note Darnelle Maffucci, Waynette Buttery Feb 24, 2021  3:27 PM) Takes more often on work days  insulin degludec (TRESIBA FLEXTOUCH) 100 UNIT/ML FlexTouch Pen [id number] Yes Inject 18 Units into the skin daily. Einar Pheasant, MD Taking Active   loperamide (IMODIUM) 2 MG capsule [id number] No Take 2 mg by mouth as needed.   Patient not taking: No sig  reported   [provider] Not Taking Active            Med Note Nat Christen Dec 26, 2020  3:55 PM)    losartan (COZAAR) 100 MG tablet [id number] Yes TAKE 1 TABLET BY MOUTH EVERY DAY Einar Pheasant, MD Taking Active   mirtazapine (REMERON) 15 MG tablet [id number] Yes Take 1-2 tablets by mouth q hs Einar Pheasant, MD Taking Active            Med Note Mayo Ao Feb 24, 2021  3:28 PM) Taking 2 tablets QPM  omeprazole (PRILOSEC) 20 MG capsule [id number] Yes TAKE 1 CAPSULE(20 MG) BY MOUTH TWICE DAILY Einar Pheasant, MD  Taking Active   ONE TOUCH ULTRA TEST test strip [id number] Yes PATIENT NEEDS NEW METER STRIPS AND LANCETS FOR ONE TOUCH. HER INSURANCE NO LONGER COVERS ACCUCHEK. Einar Pheasant, MD Taking Active   potassium chloride (KLOR-CON) 10 MEQ tablet [id number] Yes TAKE 1 TABLET(10 MEQ) BY MOUTH TWICE DAILY Einar Pheasant, MD Taking Active   rosuvastatin (CRESTOR) 10 MG tablet [id number] Yes TAKE 1 TABLET(10 MG) BY MOUTH DAILY Einar Pheasant, MD Taking Active   Semaglutide,0.25 or 0.5MG/DOS, (OZEMPIC, 0.25 OR 0.5 MG/DOSE,) 2 MG/1.5ML SOPN [id number] Yes Inject 0.5 mg weekly Einar Pheasant, MD Taking Active            Med Note Mayo Ao Feb 24, 2021  3:29 PM)    triamterene-hydrochlorothiazide [facility]) 37.5-25 MG tablet [id number] Yes TAKE 1/2 TABLET BY MOUTH DAILY Einar Pheasant, MD Taking Active           Patient Active Problem List   Diagnosis Date Noted  . Humerus fracture 11/17/2020  . Rib pain on left side 08/05/2020  . Sleep difficulties 06/08/2020  . Swelling of lower extremity 11/26/2019  . Healthcare maintenance 09/13/2019  . Sinus congestion 02/11/2019  . B12 deficiency 02/11/2019  . Asthma 12/28/2018  . 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 Vitamin D deficiency 11/01/2018  . Tendinitis of upper biceps tendon of left shoulder 09/20/2018  . Mild depression ([place]) 09/15/2018  . Mild intermittent  asthma without complication 29/92/4268  . Controlled type 2 diabetes mellitus with complication, with long-term current use of insulin ([place]) 09/15/2018  . 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 Traumatic incomplete tear of left rotator cuff 09/09/2018  . Bleeding internal hemorrhoids 09/13/2017  . 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 Rectal bleeding 07/15/2017  . Low back pain 03/29/2016  . Stress 12/12/2014  . Congestion of throat 01/06/2014  . Chronic diarrhea 02/16/2013  . Inflammatory polyps of colon ([place]) 02/16/2013  . Hypertension 10/06/2012  . GERD (gastroesophageal reflux disease) 10/06/2012  . Hypercholesterolemia 10/06/2012  . Psoriatic arthritis ([place]) 06/29/2012    Immunization History  Administered Date(s) Administered  . Fluad Quad(high Dose 65+) 07/20/2019, 07/23/2020  . Influenza Whole 07/03/2012  . Influenza, High Dose Seasonal PF 07/12/2017, 08/01/2018  . Influenza, Seasonal, Injecte, Preservative Fre 09/04/2013  . Influenza-Unspecified 07/17/2014, 07/31/2015, 07/15/2016  . PFIZER(Purple Top)SARS-COV-2 Vaccination 12/13/2019, 01/03/2020, 08/15/2020  . Pneumococcal Conjugate-13 05/17/2017  . Pneumococcal Polysaccharide-23 11/13/2020    Conditions to be addressed/monitored: HTN, HLD and DMII  Care Plan : Medication Management  Updates made by De Hollingshead, RPH-CPP since 02/24/2021 12:00 AM    Problem: Diabetes, Hypertension, Hyperlipidemia     Long-Range Goal: Disease Progression Prevention   This Visit's Progress: On track  Recent Progress: On track  Priority: High  Note:   Current Barriers:  . Unable to independently afford treatment regimen  Pharmacist Clinical Goal(s):  Marland Kitchen Over the next 90 days, patient will verbalize ability to afford treatment regimen. . Over the next 90 days, patient will maintain control of diabetes as evidenced by improvement in A1c through collaboration with  PharmD and provider.   Interventions: . 1:1 collaboration with Einar Pheasant, MD regarding development and update of comprehensive plan of care as evidenced by provider attestation and co-signature . Inter-disciplinary care team collaboration (see longitudinal plan of care) . Comprehensive medication review performed; medication list updated in electronic medical record  SDOH: . Continuing to grieve her husband's passing. Reports she's just taking it "  day by day".    Diabetes: . Controlled; current treatment: Ozempic 0.5 mg weekly, Tresiba 18 units daily . Does report a change in bowel habits due to eating smaller portion sizes. No more diarrhea. o Hx recurrent UTIs on Jardiance,  . Denies any signs/symptoms of hypoglycemia.  . Current glucose readings: fastings 80-120; post prandial also in low 100s . Current meal patterns: breakfast: banana, peanut butter, diet ginger ale; lunch: 2-3:30 pm after work: went to [employer], ate half of hamburger steak, vegetables; supper: left overs and diet ginger ale. Not a frequent water drinker. Snacks/Desserts: sometimes in the afternoon has a tiny peanut butter cup . Current exercise: works part time . Approved for [employer] patient assistance through 2022. Reviewed automatic refills.  . Recommend to continue current regimen at this time. . Discussed increasing water consumption. Patient verbalized understanding.   Hypertension: . Controlled per last clinic reading; current treatment: amlodipine 5 mg BID, losartan 100 mg QPM, triamterene/HCTZ 37.5/25 1/2 tab QAM, potassium 10 mEq BID . Home BP readings: 90-100s/70s. Reports some lightheadedness if she stands up too quickly, but denies any near falls or falls recently. . Recommended patient continue to take home readings, document, and bring with her to next PCP appointment in ~ 2 weeks. Encouraged focus on hydration with water.  . Recommended to continue current regimen with collaboration with  PCP   Hyperlipidemia: . Controlled per last lipid panel; current treatment: rosuvastatin 10 mg daily . Recommended to continue current regimen at this time  Asthma: . Controlled per patient symptom report; current regimen: Symbicort 160/4.5 mcg 2 puffs BID . Approved for Symbicort assistance through 11/01/21. Reviewed refill procedure . Continue current regimen at this time.  Grief/Insomnia: . Improved; current regimen: mirtazapine 30 mg daily  o Previously tried: hx melatonin, trazodone - no benefit . Endorses getting 6 hours of sleep nightly. Had a hard time last night, but most nights have been better.  . Discussed non pharmacologic treatment of insomnia including increased physical activity, reduced caffeine (she is drinking non caffeine) . Encouraged continued adherence to current regimen and continued focus on non-pharmacologic methods of improvement in sleep.  Patient Goals/Self-Care Activities . Over the next 90 days, patient will:  - take medications as prescribed check blood glucose periodically, document, and provide at future appointments check blood pressure periodically, document, and provide at future appointments collaborate with provider on medication access solutions  Follow Up Plan: Telephone follow up appointment with care management team member scheduled for: ~12 weeks     Medication Assistance: Joni Reining obtained through [employer] medication assistance program.  Enrollment ends 10/01/21. Symbicort obtained through [employer] medication assistance program.  Enrollment ends 11/01/21.  Patient's preferred pharmacy is:  [id number] [address] Heal, [place] AT [facility] OF [address] [place] [address] Phone: [phone] Fax: [phone]   Follow Up:  Patient agrees to Care Plan and Follow-up.  Plan: Telephone follow up appointment with care management team member scheduled for:  ~ 12 weeks  [name]  Darnelle Maffucci, PharmD, [place], [place] Clinical Pharmacist [employer] at [employer] [phone]

## 2021-02-24 NOTE — Patient Instructions (Signed)
Visit Information  PATIENT GOALS: Goals Addressed              This Visit's Progress     Patient Stated   .  Medication Monitoring (pt-stated)        Patient Goals/Self-Care Activities . Over the next 90 days, patient will:  - take medications as prescribed check blood glucose periodically, document, and provide at future appointments check blood pressure periodically, document, and provide at future appointments collaborate with provider on medication access solutions         The patient verbalized understanding of instructions, educational materials, and care plan provided today and declined offer to receive copy of patient instructions, educational materials, and care plan.   Plan: Telephone follow up appointment with care management team member scheduled for:  ~ 12 weeks  [name] Darnelle Maffucci, PharmD, [place], [place] Clinical Pharmacist [employer] at [employer] [phone]

## 2021-02-25 ENCOUNTER — Telehealth: Payer: Self-pay | Admitting: Internal Medicine

## 2021-02-25 ENCOUNTER — Telehealth: Payer: PPO

## 2021-02-25 NOTE — Telephone Encounter (Signed)
Have her decrease the triam/hctz to 1/2 tablet per day (instead of one whole tablet per day)

## 2021-02-25 NOTE — Telephone Encounter (Signed)
I would like her to stop and follow pressures.  We will see how she does off the triam/hctz.  Given we are stopping her triam/hctz, I would also like her to stop her potassium.  We will recheck potassium at her upcoming appt.

## 2021-02-25 NOTE — Telephone Encounter (Signed)
Please call Ashley Gross.  I received note from [name]/[name] that [name] blood pressure was running low.  Please confirm her current blood pressure regimen and confirm readings.  Will need to adjust medication if blood pressures are remaining low.  (per note - on losartan, triamhctz and amlodipine).  Need to confirm doses.

## 2021-02-25 NOTE — Telephone Encounter (Signed)
Her triam-hctz is already 1/2 tablet q day. Do you want her to stop?

## 2021-02-25 NOTE — Telephone Encounter (Signed)
BP readings low 100s/70s-80s. No acute symptoms. BP regimen: losartan 100 mg once daily, amlodipine 5 mg bid, triam-hctz 37.5-25mg  once daily. Patient confirmed she is ok with waiting until appt in May to discuss.

## 2021-02-26 NOTE — Telephone Encounter (Signed)
LMTCB

## 2021-02-26 NOTE — Telephone Encounter (Signed)
Patient aware of changes below will monitor BP and bring readings to appt. Will let me know if problems prior to appt.

## 2021-02-27 ENCOUNTER — Other Ambulatory Visit: Payer: Self-pay | Admitting: Internal Medicine

## 2021-03-03 ENCOUNTER — Other Ambulatory Visit: Payer: Self-pay | Admitting: Internal Medicine

## 2021-03-12 DIAGNOSIS — M778 Other enthesopathies, not elsewhere classified: Secondary | ICD-10-CM | POA: Diagnosis not present

## 2021-03-12 DIAGNOSIS — L600 Ingrowing nail: Secondary | ICD-10-CM | POA: Diagnosis not present

## 2021-03-12 DIAGNOSIS — L909 Atrophic disorder of skin, unspecified: Secondary | ICD-10-CM | POA: Diagnosis not present

## 2021-03-12 DIAGNOSIS — Q6689 Other  specified congenital deformities of feet: Secondary | ICD-10-CM | POA: Diagnosis not present

## 2021-03-13 ENCOUNTER — Other Ambulatory Visit: Payer: Self-pay

## 2021-03-13 ENCOUNTER — Ambulatory Visit (INDEPENDENT_AMBULATORY_CARE_PROVIDER_SITE_OTHER): Payer: PPO | Admitting: Internal Medicine

## 2021-03-13 VITALS — BP 122/70 | HR 74 | Temp 96.2°F | Resp 16 | Ht 64.0 in | Wt 135.0 lb

## 2021-03-13 DIAGNOSIS — G479 Sleep disorder, unspecified: Secondary | ICD-10-CM | POA: Diagnosis not present

## 2021-03-13 DIAGNOSIS — Z0001 Encounter for general adult medical examination with abnormal findings: Secondary | ICD-10-CM | POA: Diagnosis not present

## 2021-03-13 DIAGNOSIS — E7800 Pure hypercholesterolemia, unspecified: Secondary | ICD-10-CM | POA: Diagnosis not present

## 2021-03-13 DIAGNOSIS — K5140 Inflammatory polyps of colon without complications: Secondary | ICD-10-CM

## 2021-03-13 DIAGNOSIS — F320 Major depressive disorder, single episode, mild: Secondary | ICD-10-CM

## 2021-03-13 DIAGNOSIS — F439 Reaction to severe stress, unspecified: Secondary | ICD-10-CM

## 2021-03-13 DIAGNOSIS — J4520 Mild intermittent asthma, uncomplicated: Secondary | ICD-10-CM | POA: Diagnosis not present

## 2021-03-13 DIAGNOSIS — K219 Gastro-esophageal reflux disease without esophagitis: Secondary | ICD-10-CM | POA: Diagnosis not present

## 2021-03-13 DIAGNOSIS — Z1231 Encounter for screening mammogram for malignant neoplasm of breast: Secondary | ICD-10-CM

## 2021-03-13 DIAGNOSIS — Z794 Long term (current) use of insulin: Secondary | ICD-10-CM

## 2021-03-13 DIAGNOSIS — E118 Type 2 diabetes mellitus with unspecified complications: Secondary | ICD-10-CM | POA: Diagnosis not present

## 2021-03-13 DIAGNOSIS — F32A Depression, unspecified: Secondary | ICD-10-CM

## 2021-03-13 DIAGNOSIS — I10 Essential (primary) hypertension: Secondary | ICD-10-CM

## 2021-03-13 DIAGNOSIS — L4050 Arthropathic psoriasis, unspecified: Secondary | ICD-10-CM | POA: Diagnosis not present

## 2021-03-13 DIAGNOSIS — L989 Disorder of the skin and subcutaneous tissue, unspecified: Secondary | ICD-10-CM

## 2021-03-13 DIAGNOSIS — Z0000 Encounter for general adult medical examination without abnormal findings: Secondary | ICD-10-CM

## 2021-03-13 NOTE — Assessment & Plan Note (Signed)
phyisical today 03/13/21.  Colonoscopy 01/2019.

## 2021-03-13 NOTE — Progress Notes (Signed)
Patient ID: Ashley Gross, female   DOB: Aug 26, 1951, 70 y.o.   MRN: [id number]   Subjective:    Patient ID: Ashley Gross, female    DOB: [birth year], 70 y.o.   MRN: [id number]  HPI This visit occurred during the SARS-CoV-2 public health emergency.  Safety protocols were in place, including screening questions prior to the visit, additional usage of staff PPE, and extensive cleaning of exam room while observing appropriate contact time as indicated for disinfecting solutions.  Patient here for her physical exam.  Has continued weight loss.  Increased stress.  Still trying to cope with her husband's death.  Feels up fast.  Does not have as much diarrhea now, but feels related to not eating as much.  Occasional blood.  Eats small amounts throughout the day.  Overdue colonoscopy.  Discussed f/u with GI.  No chest pain.  Breathing stable.  No vomiting.  No abdominal pain.  Moles  - left side face and hairline.  Request referral to dermatology.    Past Medical History:  Diagnosis Date  . Abnormal liver function test 10/06/2012  . Anemia, unspecified   . Asthma   . Colitis   . Complication of anesthesia    asthma attack after shoulder surgery  . Diabetes mellitus ([place])   . Esophagitis   . H/O hiatal hernia   . Heart murmur   . Hx of arteriovenous malformation (AVM)   . Hypertension   . IBS (irritable bowel syndrome)   . Nephrolithiasis   . Pernicious anemia   . Personal history of colonic polyps 02/10/2013   02/08/13 colonoscopy - question of rectal varices, two 3-73m polyps in the sigmoid colon, mass at the hepatic flexure, one 557mpolyp at the hepatic flexure, nodule at the ileocecal valve, congested mucusa in the entire colon, flattened villi mucosa in the terminal ileum.    . Pneumonia   . Psoriatic arthritis (HCC)    s/p penicillamine, plaquenil, MTX, sulfasalazine.  s/p Embrel, Humira,.  Iritis.    . Pure hypercholesterolemia    Past Surgical History:  Procedure Laterality Date  .  ABDOMINAL HYSTERECTOMY  1981   ovaries left in place  . ANKLE SURGERY     right  . BUNIONECTOMY    . CHOLECYSTECTOMY  1989  . COLONOSCOPY WITH PROPOFOL N/A 12/27/2017   Procedure: COLONOSCOPY WITH PROPOFOL;  Surgeon: VaLin LandsmanMD;  [facility];  Service: Gastroenterology;  Laterality: N/A;  . COLONOSCOPY WITH PROPOFOL N/A 01/04/2019   Procedure: COLONOSCOPY WITH PROPOFOL;  Surgeon: VaLin LandsmanMD;  [facility]: [place];  Laterality: N/A;  Diabetic - oral and injectable  . ESOPHAGOGASTRODUODENOSCOPY (EGD) WITH PROPOFOL N/A 12/27/2017   Procedure: ESOPHAGOGASTRODUODENOSCOPY (EGD) WITH PROPOFOL;  Surgeon: VaLin LandsmanMD;  [place] [facility]: G[facility]ology;  Laterality: N/A;  . HAND SURGERY     right  . HEEL SPUR SURGERY    . NOSE SURGERY     x2  . POLYPECTOMY  01/04/2019   Procedure: POLYPECTOMY;  Surgeon: VaLin LandsmanMD;  [place] [facility]: [place];;  . SHOULDER ARTHROSCOPY WITH OPEN ROTATOR CUFF REPAIR Left 09/20/2018   Procedure: SHOULDER ARTHROSCOPY WITH OPEN ROTATOR CUFF REPAIR;  Surgeon: PoCorky MullMD;  Location[facility];  Service: Orthopedics;  Laterality: Left;  . SHOULDER CLOSED REDUCTION Left 12/21/2018   Procedure: MANIPULATION UNDER ANESTHESIA WITH STEROID INJECTION;  Surgeon: PoCorky MullMD;  [place] [facility]  Service: Orthopedics;  Laterality: Left;  Diabetic - insulin and oral meds  . UMBILICAL HERNIA REPAIR     Family History  Problem Relation Age of Onset  . Diabetes Other   . Hypertension Other   . Breast cancer Neg Hx   . Colon cancer Neg Hx    Social History   Socioeconomic History  . Marital status: Married    Spouse name: Not on file  . Number of children: 2  . Years of education: Not on file  . Highest education level: Not on file  Occupational History  . Not on file  Tobacco Use  . Smoking status: Never Smoker  .  Smokeless tobacco: Never Used  Vaping Use  . Vaping Use: Never used  Substance and Sexual Activity  . Alcohol use: No    Alcohol/week: 0.0 standard drinks  . Drug use: No  . Sexual activity: Not Currently  Other Topics Concern  . Not on file  Social History Narrative   She is married and has two children   Social Determinants of Health   Financial Resource Strain: Medium Risk  . Difficulty of Paying Living Expenses: Somewhat hard  Food Insecurity: No Food Insecurity  . Worried About Charity fundraiser in the Last Year: Never true  . Ran Out of Food in the Last Year: Never true  Transportation Needs: No Transportation Needs  . Lack of Transportation (Medical): No  . Lack of Transportation (Non-Medical): No  Physical Activity: Not on file  Stress: No Stress Concern Present  . Feeling of Stress : Only a little  Social Connections: Unknown  . Frequency of Communication with Friends and Family: More than three times a week  . Frequency of Social Gatherings with Friends and Family: Not on file  . Attends Religious Services: Not on file  . Active Member of Clubs or Organizations: Not on file  . Attends Archivist Meetings: Not on file  . Marital Status: Not on file    Outpatient Encounter Medications as of 03/13/2021  Medication Sig  . acetaminophen (TYLENOL) 325 MG tablet Take 650 mg by mouth every 6 (six) hours as needed.  Marland Kitchen amLODipine (NORVASC) 5 MG tablet TAKE 1 TABLET(5 MG) BY MOUTH TWICE DAILY  . budesonide-formoterol (SYMBICORT) 160-4.5 MCG/ACT inhaler Inhale 2 puffs into the lungs 2 (two) times daily.  . Calcium Carbonate-Vit D-Min (CALCIUM 600+D PLUS MINERALS) 600-400 MG-UNIT TABS Take by mouth.  . Cholecalciferol (VITAMIN D-3) 1000 UNITS CAPS Take 2 capsules by mouth daily.  . cyclobenzaprine (FLEXERIL) 10 MG tablet TAKE 1 TABLET BY MOUTH EVERY NIGHT AS NEEDED  . insulin degludec (TRESIBA FLEXTOUCH) 100 UNIT/ML FlexTouch Pen Inject 18 Units into the skin daily.   Marland Kitchen losartan (COZAAR) 100 MG tablet TAKE 1 TABLET BY MOUTH EVERY DAY  . mirtazapine (REMERON) 15 MG tablet TAKE 1 TO 2 TABLETS BY MOUTH AT BEDTIME  . omeprazole (PRILOSEC) 20 MG capsule TAKE 1 CAPSULE(20 MG) BY MOUTH TWICE DAILY  . ONE TOUCH ULTRA TEST test strip PATIENT NEEDS NEW METER STRIPS AND LANCETS FOR ONE TOUCH. HER INSURANCE NO LONGER COVERS ACCUCHEK.  Marland Kitchen potassium chloride (KLOR-CON) 10 MEQ tablet TAKE 1 TABLET(10 MEQ) BY MOUTH TWICE DAILY  . rosuvastatin (CRESTOR) 10 MG tablet TAKE 1 TABLET(10 MG) BY MOUTH DAILY  . Semaglutide,0.25 or 0.5MG/DOS, (OZEMPIC, 0.25 OR 0.5 MG/DOSE,) 2 MG/1.5ML SOPN Inject 0.5 mg weekly  . triamterene-hydrochlorothiazide (MAXZIDE-25) 37.5-25 MG tablet TAKE 1/2 TABLET BY MOUTH DAILY  . [DISCONTINUED] loperamide (  IMODIUM) 2 MG capsule Take 2 mg by mouth as needed.  (Patient not taking: No sig reported)   No facility-administered encounter medications on file as of 03/13/2021.    Review of Systems  Constitutional: Negative for appetite change and unexpected weight change.  HENT: Negative for congestion, sinus pressure and sore throat.   Eyes: Negative for pain and visual disturbance.  Respiratory: Negative for cough, chest tightness and shortness of breath.   Cardiovascular: Negative for chest pain, palpitations and leg swelling.  Gastrointestinal: Negative for abdominal pain, diarrhea, nausea and vomiting.       Early satiety.  Occasional diarrhea.   Genitourinary: Negative for difficulty urinating and dysuria.  Musculoskeletal: Negative for joint swelling and myalgias.  Skin: Negative for color change and rash.  Neurological: Negative for dizziness and headaches.  Hematological: Negative for adenopathy. Does not bruise/bleed easily.  Psychiatric/Behavioral: Negative for agitation and dysphoric mood.       Objective:    Physical Exam Vitals reviewed.  Constitutional:      General: She is not in acute distress.    Appearance: Normal appearance. She  is well-developed.  HENT:     Head: Normocephalic and atraumatic.     Right Ear: External ear normal.     Left Ear: External ear normal.  Eyes:     General: No scleral icterus.       Right eye: No discharge.        Left eye: No discharge.     Conjunctiva/sclera: Conjunctivae normal.  Neck:     Thyroid: No thyromegaly.  Cardiovascular:     Rate and Rhythm: Normal rate and regular rhythm.  Pulmonary:     Effort: No tachypnea, accessory muscle usage or respiratory distress.     Breath sounds: Normal breath sounds. No decreased breath sounds or wheezing.  Chest:  Breasts:     Right: No inverted nipple, mass, nipple discharge or tenderness (no axillary adenopathy).     Left: No inverted nipple, mass, nipple discharge or tenderness (no axilarry adenopathy).    Abdominal:     General: Bowel sounds are normal.     Palpations: Abdomen is soft.     Tenderness: There is no abdominal tenderness.  Musculoskeletal:        General: No swelling or tenderness.     Cervical back: Neck supple. No tenderness.  Lymphadenopathy:     Cervical: No cervical adenopathy.  Skin:    Findings: No erythema or rash.  Neurological:     Mental Status: She is alert and oriented to person, place, and time.  Psychiatric:        Mood and Affect: Mood normal.        Behavior: Behavior normal.     BP 122/70   Pulse 74   Temp (!) 96.2 F (35.7 C) (Temporal)   Resp 16   Ht '5\' 4"'  (1.626 m)   Wt 135 lb (61.2 kg)   SpO2 98%   BMI 23.17 kg/m  Wt Readings from Last 3 Encounters:  03/13/21 135 lb (61.2 kg)  01/07/21 154 lb (69.9 kg)  12/20/20 154 lb (69.9 kg)     Lab Results  Component Value Date   WBC 5.7 03/29/2020   HGB 12.3 03/29/2020   HCT 37.8 03/29/2020   PLT 241.0 03/29/2020   GLUCOSE 96 11/13/2020   CHOL 114 11/13/2020   TRIG 108.0 11/13/2020   HDL 53.70 11/13/2020   LDLDIRECT 74.0 04/19/2019   LDLCALC 38 11/13/2020   ALT  8 11/13/2020   AST 16 11/13/2020   NA 140 11/13/2020   K 4.4  11/26/2020   CL 103 11/13/2020   CREATININE 0.97 11/13/2020   BUN 12 11/13/2020   CO2 31 11/13/2020   TSH 1.83 11/13/2020   HGBA1C 5.4 11/13/2020   MICROALBUR 2.0 (H) 11/13/2020    DG Shoulder Left  Result Date: 10/11/2020 CLINICAL DATA:  Fall EXAM: LEFT SHOULDER - 2+ VIEW COMPARISON:  None. FINDINGS: There is a left humeral neck fracture. Fracture also extends toward the greater tuberosity. No subluxation or dislocation. Degenerative changes in the left AC joint. IMPRESSION: Left humeral neck fracture, nondisplaced. Minimally displaced fracture extends toward the greater tuberosity. Electronically Signed   By: Rolm Baptise M.D.   On: 10/11/2020 19:00   DG Hand Complete Right  Result Date: 10/11/2020 CLINICAL DATA:  Fall EXAM: RIGHT HAND - COMPLETE 3+ VIEW COMPARISON:  None. FINDINGS: There is a fracture through the midshaft of the 4th metacarpal. There appears to be volar subluxation of the MCP joints diffusely, likely related to arthritic change and chronic. No dislocation seen. Degenerative changes throughout the IP joints, MCP joints and 1st carpometacarpal joint. IMPRESSION: Oblique nondisplaced fracture through the midshaft of the right 4th metacarpal. Electronically Signed   By: Rolm Baptise M.D.   On: 10/11/2020 19:05       Assessment & Plan:   Problem List Items Addressed This Visit    Asthma    Breathing stable        Controlled type 2 diabetes mellitus with complication, with long-term current use of insulin ([place])    Low carb diet and exercise.  On ozempic and tresiba.  Check met b and a1c..       Relevant Orders   Hemoglobin A1c   GERD (gastroesophageal reflux disease)    No upper symptoms.  On omeprazole.        Healthcare maintenance    phyisical today 03/13/21.  Colonoscopy 01/2019.        Hypercholesterolemia    Continue crestor. Low cholesterol diet and exercise.  Follow lipid panel and liver function tests.        Relevant Orders   Hepatic function  panel   Lipid panel   Hypertension    Continues on triam/hctz, amlodipine and losartan.  Blood pressure as outlined.  Recheck 120/68.  Follow pressures.  Follow metabolic panel.       Relevant Orders   CBC with Differential/Platelet   Basic metabolic panel   Inflammatory polyps of colon ([place])    Overdue colonoscopy.  Previous colonoscopy with removal of multiple polyps.  Refer back to GI.       Mild depression (HCC)    Trying to cope with her husband's death.  Continue remeron.  Follow.        Psoriatic arthritis ([place])    Stable.  Followed by Dr Jefm Bryant.       Skin lesion    Skin lesions:  Scalp/hairline and left side face.  Request referral to dermatology.       Relevant Orders   Ambulatory referral to Dermatology   Sleep difficulties    On remeron.  Stable.        Stress    Continue remeron.  Discussed.  Has good support.  Does not feel needs any further intervention.  Follow.         Other Visit Diagnoses    Routine general medical examination at a health care facility    -  Primary   Visit for screening mammogram       Relevant Orders   MM 3D SCREEN BREAST BILATERAL       Einar Pheasant, MD

## 2021-03-15 ENCOUNTER — Encounter: Payer: Self-pay | Admitting: Internal Medicine

## 2021-03-15 ENCOUNTER — Telehealth: Payer: Self-pay | Admitting: Internal Medicine

## 2021-03-15 DIAGNOSIS — L989 Disorder of the skin and subcutaneous tissue, unspecified: Secondary | ICD-10-CM | POA: Insufficient documentation

## 2021-03-15 NOTE — Assessment & Plan Note (Signed)
No upper symptoms.  On omeprazole.  

## 2021-03-15 NOTE — Assessment & Plan Note (Signed)
Low carb diet and exercise.  On ozempic and tresiba.  Check met b and a1c.Marland Kitchen

## 2021-03-15 NOTE — Assessment & Plan Note (Signed)
Continue remeron.  Discussed.  Has good support.  Does not feel needs any further intervention.  Follow.

## 2021-03-15 NOTE — Telephone Encounter (Signed)
She has seen GI previously - Dr Marius Ditch, but it has been >2 years since seen.  She needs f/u appt - overdue for colonoscopy and needs evaluation for persistent weight loss.  Do I need to place an order for new referral?  Thanks for your help.

## 2021-03-15 NOTE — Assessment & Plan Note (Signed)
Stable.  Followed by Dr Kernodle.   

## 2021-03-15 NOTE — Assessment & Plan Note (Signed)
Skin lesions:  Scalp/hairline and left side face.  Request referral to dermatology.

## 2021-03-15 NOTE — Assessment & Plan Note (Signed)
Trying to cope with her husband's death.  Continue remeron.  Follow.

## 2021-03-15 NOTE — Assessment & Plan Note (Signed)
On remeron.  Stable.   

## 2021-03-15 NOTE — Assessment & Plan Note (Signed)
Continue crestor.  Low cholesterol diet and exercise. Follow lipid panel and liver function tests.   

## 2021-03-15 NOTE — Assessment & Plan Note (Signed)
Continues on triam/hctz, amlodipine and losartan.  Blood pressure as outlined.  Recheck 120/68.  Follow pressures.  Follow metabolic panel.

## 2021-03-15 NOTE — Assessment & Plan Note (Signed)
Breathing stable.

## 2021-03-15 NOTE — Assessment & Plan Note (Signed)
Overdue colonoscopy.  Previous colonoscopy with removal of multiple polyps.  Refer back to GI.

## 2021-03-18 DIAGNOSIS — L821 Other seborrheic keratosis: Secondary | ICD-10-CM | POA: Diagnosis not present

## 2021-03-21 ENCOUNTER — Ambulatory Visit
Admission: RE | Admit: 2021-03-21 | Discharge: 2021-03-21 | Disposition: A | Discharge status: Home / Self Care | Payer: PPO | Source: Ambulatory Visit | Attending: Internal Medicine | Admitting: Internal Medicine

## 2021-03-21 ENCOUNTER — Other Ambulatory Visit (INDEPENDENT_AMBULATORY_CARE_PROVIDER_SITE_OTHER): Payer: PPO

## 2021-03-21 ENCOUNTER — Other Ambulatory Visit: Payer: Self-pay

## 2021-03-21 DIAGNOSIS — E7800 Pure hypercholesterolemia, unspecified: Secondary | ICD-10-CM

## 2021-03-21 DIAGNOSIS — I10 Essential (primary) hypertension: Secondary | ICD-10-CM

## 2021-03-21 DIAGNOSIS — Z794 Long term (current) use of insulin: Secondary | ICD-10-CM | POA: Diagnosis not present

## 2021-03-21 DIAGNOSIS — E876 Hypokalemia: Secondary | ICD-10-CM

## 2021-03-21 DIAGNOSIS — Z1231 Encounter for screening mammogram for malignant neoplasm of breast: Secondary | ICD-10-CM | POA: Diagnosis not present

## 2021-03-21 DIAGNOSIS — E118 Type 2 diabetes mellitus with unspecified complications: Secondary | ICD-10-CM | POA: Diagnosis not present

## 2021-03-21 LAB — LIPID PANEL
Cholesterol: 114 mg/dL (ref 0–200)
HDL: 44.3 mg/dL (ref >39.00)
LDL Cholesterol: 54 mg/dL (ref 0–99)
NonHDL: 69.59
Total CHOL/HDL Ratio: 3
Triglycerides: 79 mg/dL (ref 0.0–149.0)
VLDL: 15.8 mg/dL (ref 0.0–40.0)

## 2021-03-21 LAB — CBC WITH DIFFERENTIAL/PLATELET
Basophils Absolute: 0 10*3/uL (ref 0.0–0.1)
Basophils Relative: 0.7 % (ref 0.0–3.0)
Eosinophils Absolute: 0 10*3/uL (ref 0.0–0.7)
Eosinophils Relative: 0.5 % (ref 0.0–5.0)
HCT: 34.9 % — ABNORMAL LOW (ref 36.0–46.0)
Hemoglobin: 11.4 g/dL — ABNORMAL LOW (ref 12.0–15.0)
Lymphocytes Relative: 30.7 % (ref 12.0–46.0)
Lymphs Abs: 1.9 10*3/uL (ref 0.7–4.0)
MCHC: 32.8 g/dL (ref 30.0–36.0)
MCV: 87.9 fl (ref 78.0–100.0)
Monocytes Absolute: 0.4 10*3/uL (ref 0.1–1.0)
Monocytes Relative: 6.3 % (ref 3.0–12.0)
Neutro Abs: 3.8 10*3/uL (ref 1.4–7.7)
Neutrophils Relative %: 61.8 % (ref 43.0–77.0)
Platelets: 277 10*3/uL (ref 150.0–400.0)
RBC: 3.97 Mil/uL (ref 3.87–5.11)
RDW: 14.3 % (ref 11.5–15.5)
WBC: 6.2 10*3/uL (ref 4.0–10.5)

## 2021-03-21 LAB — BASIC METABOLIC PANEL
BUN: 10 mg/dL (ref 6–23)
CO2: 30 mEq/L (ref 19–32)
Calcium: 8.3 mg/dL — ABNORMAL LOW (ref 8.4–10.5)
Chloride: 104 mEq/L (ref 96–112)
Creatinine, Ser: 0.92 mg/dL (ref 0.40–1.20)
GFR: 63.24 mL/min (ref >60.00)
Glucose, Bld: 52 mg/dL — ABNORMAL LOW (ref 70–99)
Potassium: 3.4 mEq/L — ABNORMAL LOW (ref 3.5–5.1)
Sodium: 142 mEq/L (ref 135–145)

## 2021-03-21 LAB — HEPATIC FUNCTION PANEL
ALT: 12 U/L (ref 0–35)
AST: 18 U/L (ref 0–37)
Albumin: 3.5 g/dL (ref 3.5–5.2)
Alkaline Phosphatase: 84 U/L (ref 39–117)
Bilirubin, Direct: 0.1 mg/dL (ref 0.0–0.3)
Total Bilirubin: 0.5 mg/dL (ref 0.2–1.2)
Total Protein: 5.4 g/dL — ABNORMAL LOW (ref 6.0–8.3)

## 2021-03-21 LAB — HEMOGLOBIN A1C: Hgb A1c MFr Bld: 5.4 % (ref 4.6–6.5)

## 2021-03-23 ENCOUNTER — Other Ambulatory Visit: Payer: Self-pay | Admitting: Internal Medicine

## 2021-03-23 DIAGNOSIS — D649 Anemia, unspecified: Secondary | ICD-10-CM

## 2021-03-23 DIAGNOSIS — E876 Hypokalemia: Secondary | ICD-10-CM

## 2021-03-23 NOTE — Progress Notes (Signed)
Order placed for f/u labs.  

## 2021-03-24 ENCOUNTER — Telehealth: Payer: Self-pay

## 2021-03-24 NOTE — Telephone Encounter (Signed)
For the patient to call back for lab results.  Tamara Kenyon,cma

## 2021-03-24 NOTE — Telephone Encounter (Signed)
-----   Message from Einar Pheasant, MD sent at 03/23/2021  7:14 AM EDT ----- Notify pt that her potassium is slightly decreased.  Confirm she is on KCL bid.  Encourage increased po food intake.  Can send information on foods with increased potassium.  These labs also reveal her spot check of sugar - low. Please confirm with her - no problem with low blood sugars.  Cholesterol levels look good.  Overall sugar control in the normal range.  She is currently on ozempic and tresiba.  Please document her insulin dosing.  Hgb has decreased.  Will need to recheck soon to confirm stable.  Will also check iron stores.  She has seen Dr Marius Ditch and is overdue f/u.  If she does not have appt scheduled, need to schedule f/u appt - needs f/u given her weight loss, GI issues and anemia.  Recheck potassium, cbc and iron studies in 7-10 days.  Kidney function ok - improved.  Liver function tests wnl.

## 2021-03-27 NOTE — Telephone Encounter (Signed)
Please call Dr Verlin Grills office and schedule Ms Alcaraz for a f/u appt with Dr Marius Ditch.  She is having persistent weight loss and due colonoscopy. Thanks.

## 2021-03-28 NOTE — Telephone Encounter (Signed)
Called [facility] to schedule appt. Office was closed. Will try again.

## 2021-03-29 ENCOUNTER — Other Ambulatory Visit: Payer: Self-pay | Admitting: Internal Medicine

## 2021-04-01 ENCOUNTER — Telehealth: Payer: Self-pay | Admitting: Internal Medicine

## 2021-04-01 NOTE — Telephone Encounter (Signed)
Transfer to Nurse Line  PT called in to advise she has had a fever of over 102 since last Thursday. PT also stated she is coughing up greenish-yellowish mucus and has a upper respiratory infection. She took 2 covid tests that were both negatitive

## 2021-04-01 NOTE — Telephone Encounter (Signed)
Saw first note where patient was scheduled, patient called back and was transferred to nurse line and disposition of Access Nurse was ER so called patient back no immediate distress still having fever chills, reports she doe snot feel SOB just has coughing episodes and cough is productive with yellow to green mucus . Patient prefers to keep virtual appointment scheduled for 04/02/21 but if symptoms worsen she will go to ER

## 2021-04-01 NOTE — Telephone Encounter (Signed)
Noted  

## 2021-04-01 NOTE — Telephone Encounter (Signed)
Patient has been scheduled for a virtual on 04/02/21. Since Friday she has been running a fever and coughing. Now she has head congestion along with symptoms. Patient has tested herself at home for covid and results are negative. Patient has been taking Mucinex.

## 2021-04-02 ENCOUNTER — Telehealth (INDEPENDENT_AMBULATORY_CARE_PROVIDER_SITE_OTHER): Payer: PPO | Admitting: Internal Medicine

## 2021-04-02 ENCOUNTER — Other Ambulatory Visit: Payer: Self-pay

## 2021-04-02 ENCOUNTER — Encounter: Payer: Self-pay | Admitting: Internal Medicine

## 2021-04-02 VITALS — Ht 64.0 in | Wt 126.7 lb

## 2021-04-02 DIAGNOSIS — J019 Acute sinusitis, unspecified: Secondary | ICD-10-CM

## 2021-04-02 DIAGNOSIS — R059 Cough, unspecified: Secondary | ICD-10-CM | POA: Diagnosis not present

## 2021-04-02 DIAGNOSIS — J4521 Mild intermittent asthma with (acute) exacerbation: Secondary | ICD-10-CM | POA: Diagnosis not present

## 2021-04-02 DIAGNOSIS — H1032 Unspecified acute conjunctivitis, left eye: Secondary | ICD-10-CM

## 2021-04-02 DIAGNOSIS — R509 Fever, unspecified: Secondary | ICD-10-CM | POA: Diagnosis not present

## 2021-04-02 MED ORDER — HYDROCODONE BIT-HOMATROP MBR 5-1.5 MG/5ML PO SOLN: 5.0000 mL | Freq: Every evening | ORAL | 0 refills | Status: DC | PRN

## 2021-04-02 MED ORDER — AZITHROMYCIN 250 MG PO TABS: ORAL_TABLET | ORAL | 0 refills | Status: AC

## 2021-04-02 MED ORDER — SALINE SPRAY 0.65 % NA SOLN: 2.0000 | NASAL | 2 refills | Status: DC | PRN

## 2021-04-02 MED ORDER — ALBUTEROL SULFATE (2.5 MG/3ML) 0.083% IN NEBU: 2.5000 mg | INHALATION_SOLUTION | RESPIRATORY_TRACT | 12 refills | Status: DC | PRN

## 2021-04-02 MED ORDER — PREDNISONE 20 MG PO TABS: ORAL_TABLET | ORAL | 0 refills | Status: DC

## 2021-04-02 MED ORDER — ERYTHROMYCIN 5 MG/GM OP OINT: 1.0000 "application " | TOPICAL_OINTMENT | Freq: Four times a day (QID) | OPHTHALMIC | 0 refills | Status: DC

## 2021-04-02 NOTE — Telephone Encounter (Signed)
Appt scheduled for 06/20/21 at 10:30. Patient aware of appt date and time.

## 2021-04-02 NOTE — Progress Notes (Signed)
Virtual Visit via Video Note  I connected with Ashley Gross   on 04/02/21 at  3:47 PM EDT by a video enabled telemedicine application and verified that I am speaking with the correct person using two identifiers.  Location patient: home, [place] Location provider:work or home office Persons participating in the virtual visit: patient, provider  I discussed the limitations of evaluation and management by telemedicine and the availability of in person appointments. The patient expressed understanding and agreed to proceed.   HPI:  Acute telemedicine visit for : Sick visit  She had pfizer 4th shot 03/25/21 and Thursday had h/a fever 103, cough neg covid test Sunday at [facility] and home test, having left eye red and vomiting x 1 which is now resolved. Has tried albuterol neb 0.083 1-2 x per day. Cough is productive with yellow/green phelgm and feele chest congestion no wheezing, or sob or chest tightness. She tried mucinex Thursday she takes zyrtec but sx's still persisting. On symbicort 160-4.5 2 puffs bid reviewed cxr 08/08/20 and GB is absent.  She has h/o asthma    -COVID-19 vaccine status: 4/4 pfizer   ROS: See pertinent positives and negatives per HPI.  Past Medical History:  Diagnosis Date  . Abnormal liver function test 10/06/2012  . Anemia, unspecified   . Asthma   . Colitis   . Complication of anesthesia    asthma attack after shoulder surgery  . Diabetes mellitus ([place])   . Esophagitis   . H/O hiatal hernia   . Heart murmur   . Hx of arteriovenous malformation (AVM)   . Hypertension   . IBS (irritable bowel syndrome)   . Nephrolithiasis   . Pernicious anemia   . Personal history of colonic polyps 02/10/2013   02/08/13 colonoscopy - question of rectal varices, two 3-6mm polyps in the sigmoid colon, mass at the hepatic flexure, one 30mm polyp at the hepatic flexure, nodule at the ileocecal valve, congested mucusa in the entire colon, flattened villi mucosa in the terminal ileum.    .  Pneumonia   . Psoriatic arthritis (HCC)    s/p penicillamine, plaquenil, MTX, sulfasalazine.  s/p Embrel, Humira,.  Iritis.    . Pure hypercholesterolemia     Past Surgical History:  Procedure Laterality Date  . ABDOMINAL HYSTERECTOMY  1981   ovaries left in place  . ANKLE SURGERY     right  . BUNIONECTOMY    . CHOLECYSTECTOMY  1989  . COLONOSCOPY WITH PROPOFOL N/A 12/27/2017   Procedure: COLONOSCOPY WITH PROPOFOL;  Surgeon: Lin Landsman, MD;  Location[facility] ENDOSCOPY;  Service: Gastroenterology;  Laterality: N/A;  . COLONOSCOPY WITH PROPOFOL N/A 01/04/2019   Procedure: COLONOSCOPY WITH PROPOFOL;  Surgeon: Lin Landsman, MD;  Location: [place];  Service: Endoscopy;  Laterality: N/A;  Diabetic - oral and injectable  . ESOPHAGOGASTRODUODENOSCOPY (EGD) WITH PROPOFOL N/A 12/27/2017   Procedure: ESOPHAGOGASTRODUODENOSCOPY (EGD) WITH PROPOFOL;  Surgeon: Lin Landsman, MD;  Location: [place];  Service: Gastroenterology;  Laterality: N/A;  . HAND SURGERY     right  . HEEL SPUR SURGERY    . NOSE SURGERY     x2  . POLYPECTOMY  01/04/2019   Procedure: POLYPECTOMY;  Surgeon: Lin Landsman, MD;  Location: [place];  Service: Endoscopy;;  . SHOULDER ARTHROSCOPY WITH OPEN ROTATOR CUFF REPAIR Left 09/20/2018   Procedure: SHOULDER ARTHROSCOPY WITH OPEN ROTATOR CUFF REPAIR;  Surgeon: Corky Mull, MD;  Location: [facility];  Service: Orthopedics;  Laterality: Left;  .  SHOULDER CLOSED REDUCTION Left 12/21/2018   Procedure: MANIPULATION UNDER ANESTHESIA WITH STEROID INJECTION;  Surgeon: Corky Mull, MD;  Location: [place];  Service: Orthopedics;  Laterality: Left;  Diabetic - insulin and oral meds  . UMBILICAL HERNIA REPAIR       Current Outpatient Medications:  .  acetaminophen (TYLENOL) 325 MG tablet, Take 650 mg by mouth every 6 (six) hours as needed., Disp: , Rfl:  .  albuterol (PROVENTIL) (2.5 MG/3ML) 0.083% nebulizer solution, Take 3  mLs (2.5 mg total) by nebulization every 4 (four) hours as needed for wheezing or shortness of breath., Disp: 360 mL, Rfl: 12 .  amLODipine (NORVASC) 5 MG tablet, TAKE 1 TABLET(5 MG) BY MOUTH TWICE DAILY, Disp: 180 tablet, Rfl: 1 .  azithromycin (ZITHROMAX) 250 MG tablet, With food Take 2 tablets on day 1, then 1 tablet daily on days 2 through 5, Disp: 6 tablet, Rfl: 0 .  budesonide-formoterol (SYMBICORT) 160-4.5 MCG/ACT inhaler, Inhale 2 puffs into the lungs 2 (two) times daily., Disp: 3 each, Rfl: 3 .  Calcium Carbonate-Vit D-Min (CALCIUM 600+D PLUS MINERALS) 600-400 MG-UNIT TABS, Take by mouth., Disp: , Rfl:  .  Cholecalciferol (VITAMIN D-3) 1000 UNITS CAPS, Take 2 capsules by mouth daily., Disp: , Rfl:  .  cyclobenzaprine (FLEXERIL) 10 MG tablet, TAKE 1 TABLET BY MOUTH EVERY NIGHT AS NEEDED, Disp: 30 tablet, Rfl: 0 .  erythromycin ophthalmic ointment, Place 1 application into the left eye 4 (four) times daily. X 4-7 days, Disp: 3.5 g, Rfl: 0 .  HYDROcodone bit-homatropine (HYCODAN) 5-1.5 MG/5ML syrup, Take 5 mLs by mouth at bedtime as needed for cough., Disp: 120 mL, Rfl: 0 .  insulin degludec (TRESIBA FLEXTOUCH) 100 UNIT/ML FlexTouch Pen, Inject 18 Units into the skin daily., Disp: 12 mL, Rfl: 2 .  losartan (COZAAR) 100 MG tablet, TAKE 1 TABLET BY MOUTH EVERY DAY, Disp: 90 tablet, Rfl: 1 .  mirtazapine (REMERON) 15 MG tablet, TAKE 1 TO 2 TABLETS BY MOUTH AT BEDTIME, Disp: 60 tablet, Rfl: 1 .  omeprazole (PRILOSEC) 20 MG capsule, TAKE 1 CAPSULE(20 MG) BY MOUTH TWICE DAILY, Disp: 180 capsule, Rfl: 1 .  ONE TOUCH ULTRA TEST test strip, PATIENT NEEDS NEW METER STRIPS AND LANCETS FOR ONE TOUCH. HER INSURANCE NO LONGER COVERS ACCUCHEK., Disp: 100 each, Rfl: PRN .  predniSONE (DELTASONE) 20 MG tablet, 40 mg x 4 days, 20 mg x 3 days, 10 mg x 2 days with breakfast in the am, Disp: 12 tablet, Rfl: 0 .  rosuvastatin (CRESTOR) 10 MG tablet, TAKE 1 TABLET(10 MG) BY MOUTH DAILY, Disp: 90 tablet, Rfl: 1 .   Semaglutide,0.25 or 0.5MG /DOS, (OZEMPIC, 0.25 OR 0.5 MG/DOSE,) 2 MG/1.5ML SOPN, Inject 0.5 mg weekly, Disp: 1 pen, Rfl: 0 .  potassium chloride (KLOR-CON) 10 MEQ tablet, TAKE 1 TABLET(10 MEQ) BY MOUTH TWICE DAILY (Patient not taking: Reported on 04/02/2021), Disp: 180 tablet, Rfl: 1 .  triamterene-hydrochlorothiazide (MAXZIDE-25) 37.5-25 MG tablet, TAKE 1/2 TABLET BY MOUTH DAILY (Patient not taking: Reported on 04/02/2021), Disp: 45 tablet, Rfl: 1  EXAM:  VITALS per patient if applicable:  GENERAL: alert, oriented, appears well and in no acute distress  HEENT: atraumatic, conjunttiva clear, no obvious abnormalities on inspection of external nose and ears  NECK: normal movements of the head and neck  LUNGS: on inspection no signs of respiratory distress, breathing rate appears normal, no obvious gross SOB, gasping or wheezing +cough on exam no resp. Distress  CV: no obvious cyanosis  MS: moves  all visible extremities without noticeable abnormality  PSYCH/NEURO: pleasant and cooperative, no obvious depression or anxiety, speech and thought processing grossly intact  ASSESSMENT AND PLAN:  Discussed the following assessment and plan:  Mild intermittent asthma with acute exacerbation likely with post vaccine covid shot 4th dose 03/25/21 fever ?sinusitis- Plan: albuterol (PROVENTIL) (2.5 MG/3ML) 0.083% nebulizer solution q4 hrs prn with  symbicort 160-4.5 2 puffs bid,  HYDROcodone bit-homatropine (HYCODAN) 5-1.5 MG/5ML syrup azithromycin (ZITHROMAX) 250 MG tablet, predniSONE (DELTASONE) 40-20-10 taper over 9 days Add nasal saline  Contin zyrtec  Pt states can tolerate zpack orally CXR if not better at [facility] x [facility] and other home on 03/30/21  Acute conjunctivitis of left eye, unspecified acute conjunctivitis type - Plan: erythromycin ophthalmic ointment   Fever, unspecified fever cause - Plan: azithromycin (ZITHROMAX) 250 MG tablet Monitor temp prn tylenol  cxr if not  better ordered [facility]   Of note pt wants to [facility] I.e chronic meds  -we discussed possible serious and likely etiologies, options for evaluation and workup, limitations of telemedicine visit vs in person visit, treatment, treatment risks and precautions.    Advised to schedule follow up visit with PCP or [facility] if any further questions or concerns to avoid delays in care.   I discussed the assessment and treatment plan with the patient. The patient was provided an opportunity to ask questions and all were answered. The patient agreed with the plan and demonstrated an understanding of the instructions.    Time spent 20 min Delorise Jackson, MD

## 2021-04-02 NOTE — Progress Notes (Signed)
Symptom onset last Thursday. No one around the Patient is sick. Last Tuesday Patient took her second booster shot.   Patient has history of upper respiratory infections. Has been using nebulizer treatments.   Headaches, fever(highest 100.6), cough, head congestion. Left eye having drainage and was stuck shut. Yellowish/green phlegm. Threw up once last Wednesday.   Negative COVID test Sunday at [place] a negative home test on Sunday as well.

## 2021-04-08 ENCOUNTER — Telehealth: Payer: Self-pay

## 2021-04-08 NOTE — Telephone Encounter (Signed)
Ozempic patient assistance meds received, labeled and placed in refrigerator. Ready for pick up. LM for patient.

## 2021-04-09 ENCOUNTER — Other Ambulatory Visit (INDEPENDENT_AMBULATORY_CARE_PROVIDER_SITE_OTHER): Payer: PPO

## 2021-04-09 ENCOUNTER — Other Ambulatory Visit: Payer: Self-pay

## 2021-04-09 DIAGNOSIS — D649 Anemia, unspecified: Secondary | ICD-10-CM | POA: Diagnosis not present

## 2021-04-09 DIAGNOSIS — E876 Hypokalemia: Secondary | ICD-10-CM

## 2021-04-10 LAB — IBC + FERRITIN
Ferritin: 52.8 ng/mL (ref 10.0–291.0)
Iron: 33 ug/dL — ABNORMAL LOW (ref 42–145)
Saturation Ratios: 10.4 % — ABNORMAL LOW (ref 20.0–50.0)
Transferrin: 227 mg/dL (ref 212.0–360.0)

## 2021-04-10 LAB — CBC WITH DIFFERENTIAL/PLATELET
Basophils Absolute: 0.1 10*3/uL (ref 0.0–0.1)
Basophils Relative: 0.9 % (ref 0.0–3.0)
Eosinophils Absolute: 0.1 10*3/uL (ref 0.0–0.7)
Eosinophils Relative: 0.5 % (ref 0.0–5.0)
HCT: 35.5 % — ABNORMAL LOW (ref 36.0–46.0)
Hemoglobin: 11.6 g/dL — ABNORMAL LOW (ref 12.0–15.0)
Lymphocytes Relative: 20.9 % (ref 12.0–46.0)
Lymphs Abs: 2.3 10*3/uL (ref 0.7–4.0)
MCHC: 32.6 g/dL (ref 30.0–36.0)
MCV: 87.4 fl (ref 78.0–100.0)
Monocytes Absolute: 0.7 10*3/uL (ref 0.1–1.0)
Monocytes Relative: 6.6 % (ref 3.0–12.0)
Neutro Abs: 7.9 10*3/uL — ABNORMAL HIGH (ref 1.4–7.7)
Neutrophils Relative %: 71.1 % (ref 43.0–77.0)
Platelets: 397 10*3/uL (ref 150.0–400.0)
RBC: 4.07 Mil/uL (ref 3.87–5.11)
RDW: 14.1 % (ref 11.5–15.5)
WBC: 11.1 10*3/uL — ABNORMAL HIGH (ref 4.0–10.5)

## 2021-04-10 LAB — VITAMIN B12: Vitamin B-12: 620 pg/mL (ref 211–911)

## 2021-04-10 LAB — POTASSIUM: Potassium: 3.4 mEq/L — ABNORMAL LOW (ref 3.5–5.1)

## 2021-04-11 ENCOUNTER — Telehealth: Payer: Self-pay

## 2021-04-11 NOTE — Telephone Encounter (Signed)
PT called to return phone call

## 2021-04-11 NOTE — Telephone Encounter (Signed)
Left message to return call for lab results.

## 2021-04-11 NOTE — Telephone Encounter (Signed)
LMTCB

## 2021-04-14 NOTE — Telephone Encounter (Signed)
LMTCB

## 2021-04-15 NOTE — Telephone Encounter (Signed)
LM for patient to call back on both numbers.

## 2021-04-16 NOTE — Telephone Encounter (Signed)
LMTCB

## 2021-04-16 NOTE — Telephone Encounter (Signed)
See result note.  

## 2021-04-16 NOTE — Telephone Encounter (Signed)
Patient returned a call to speak with [place].

## 2021-04-17 NOTE — Addendum Note (Signed)
Addended by: Lars Masson on: 04/17/2021 03:19 PM   Modules accepted: Orders

## 2021-04-22 ENCOUNTER — Other Ambulatory Visit (INDEPENDENT_AMBULATORY_CARE_PROVIDER_SITE_OTHER): Payer: PPO

## 2021-04-22 ENCOUNTER — Other Ambulatory Visit: Payer: Self-pay

## 2021-04-22 DIAGNOSIS — E876 Hypokalemia: Secondary | ICD-10-CM | POA: Diagnosis not present

## 2021-04-22 LAB — POTASSIUM: Potassium: 3.8 mEq/L (ref 3.5–5.1)

## 2021-04-28 ENCOUNTER — Other Ambulatory Visit: Payer: Self-pay | Admitting: Internal Medicine

## 2021-05-03 ENCOUNTER — Telehealth: Payer: Self-pay | Admitting: Internal Medicine

## 2021-05-13 ENCOUNTER — Telehealth: Payer: Self-pay

## 2021-05-13 NOTE — Telephone Encounter (Signed)
Placed call to [place] to let her know her Zeb Comfort flextouch pens are ready for her to pick. Pt didn't answer, left message to call back.

## 2021-05-15 NOTE — Telephone Encounter (Signed)
PT needs a refill of their mirtazapine (REMERON) 15 MG tablet and they stated they are currently out of medicine.

## 2021-05-16 NOTE — Telephone Encounter (Signed)
LMTCB. Need to let pt know that we spoke with pharmacy and they have a refill on file for pt to get filled.

## 2021-05-16 NOTE — Telephone Encounter (Signed)
Pt returned call and stated that a PA was needed. I have submitted the PA and it states that medication is available without authorization. Called [facility] to let them know they stated that they were trying to refill the old rx that states take half a tablet daily. Explained to them that a new rx was sent in on 05/06/2021 with different directions of taking 1 to 2 tablets daily. She stated that she would speak with the pharmacist and get this medication filled for pt. Called pt back to let her know. Pt gave a verbal understanding.

## 2021-05-23 ENCOUNTER — Other Ambulatory Visit: Payer: Self-pay

## 2021-05-23 ENCOUNTER — Ambulatory Visit (INDEPENDENT_AMBULATORY_CARE_PROVIDER_SITE_OTHER): Payer: PPO | Admitting: Internal Medicine

## 2021-05-23 VITALS — BP 114/68 | HR 88 | Temp 97.8°F | Resp 16 | Ht 64.0 in | Wt 121.0 lb

## 2021-05-23 DIAGNOSIS — J4520 Mild intermittent asthma, uncomplicated: Secondary | ICD-10-CM

## 2021-05-23 DIAGNOSIS — E118 Type 2 diabetes mellitus with unspecified complications: Secondary | ICD-10-CM

## 2021-05-23 DIAGNOSIS — K5140 Inflammatory polyps of colon without complications: Secondary | ICD-10-CM | POA: Diagnosis not present

## 2021-05-23 DIAGNOSIS — E7800 Pure hypercholesterolemia, unspecified: Secondary | ICD-10-CM

## 2021-05-23 DIAGNOSIS — K219 Gastro-esophageal reflux disease without esophagitis: Secondary | ICD-10-CM

## 2021-05-23 DIAGNOSIS — I10 Essential (primary) hypertension: Secondary | ICD-10-CM | POA: Diagnosis not present

## 2021-05-23 DIAGNOSIS — F439 Reaction to severe stress, unspecified: Secondary | ICD-10-CM

## 2021-05-23 DIAGNOSIS — K625 Hemorrhage of anus and rectum: Secondary | ICD-10-CM

## 2021-05-23 DIAGNOSIS — H938X9 Other specified disorders of ear, unspecified ear: Secondary | ICD-10-CM

## 2021-05-23 DIAGNOSIS — F32A Depression, unspecified: Secondary | ICD-10-CM

## 2021-05-23 DIAGNOSIS — Z794 Long term (current) use of insulin: Secondary | ICD-10-CM

## 2021-05-23 DIAGNOSIS — L4050 Arthropathic psoriasis, unspecified: Secondary | ICD-10-CM | POA: Diagnosis not present

## 2021-05-23 DIAGNOSIS — R7309 Other abnormal glucose: Secondary | ICD-10-CM

## 2021-05-23 DIAGNOSIS — E162 Hypoglycemia, unspecified: Secondary | ICD-10-CM | POA: Diagnosis not present

## 2021-05-23 DIAGNOSIS — F320 Major depressive disorder, single episode, mild: Secondary | ICD-10-CM | POA: Diagnosis not present

## 2021-05-23 LAB — HM DIABETES FOOT EXAM

## 2021-05-23 LAB — GLUCOSE, POCT (MANUAL RESULT ENTRY): POC Glucose: 70 mg/dl (ref 70–99)

## 2021-05-23 NOTE — Patient Instructions (Signed)
Decrease tresiba as discussed.   Decrease ozempic - .25  Call with update over the next 1-2 weeks.

## 2021-05-23 NOTE — Progress Notes (Signed)
Patient ID: Ashley Gross, female   DOB: October 29, 1951, 70 y.o.   MRN: [id number]   Subjective:    Patient ID: Ashley Gross, female    DOB: [birth year], 70 y.o.   MRN: [id number]  HPI This visit occurred during the SARS-CoV-2 public health emergency.  Safety protocols were in place, including screening questions prior to the visit, additional usage of staff PPE, and extensive cleaning of exam room while observing appropriate contact time as indicated for disinfecting solutions.   Patient here for a scheduled follow up.  She is here to follow up regarding her blood sugar, cholesterol and blood pressure.  Also increased stress.  Still trying to cope with her husband's passing.  She has good support.  Weight continues to decreased.  She is eating, but does not eat much.  Gets full fast.  No vomiting.  Certain foods will cause nausea (fried foods, salad).  She is on ozempic.  Discussed sugars.  Blood sugar 71 this am.  Most am readings 89-100.  Taking tresiba.  Given weight loss, discussed decrease in dosing.  No chest pain.  Breathing stable.  No increased cough or congestion.  Does report intermittent stopped up left (and right occasionally) ear.  No pain. No overall hearing change.  No ringing.  No increased sinus congestion.  No acid reflux reported.  No increased abdominal pain.  Still notices occasional blood - with bm.  Seeing Dr Marius Ditch.   Past Medical History:  Diagnosis Date   Abnormal liver function test 10/06/2012   Anemia, unspecified    Asthma    Colitis    Complication of anesthesia    asthma attack after shoulder surgery   Diabetes mellitus ([place])    Esophagitis    H/O hiatal hernia    Heart murmur    Hx of arteriovenous malformation (AVM)    Hypertension    IBS (irritable bowel syndrome)    Nephrolithiasis    Pernicious anemia    Personal history of colonic polyps 02/10/2013   02/08/13 colonoscopy - question of rectal varices, two 3-62m polyps in the sigmoid colon, mass at the  hepatic flexure, one 543mpolyp at the hepatic flexure, nodule at the ileocecal valve, congested mucusa in the entire colon, flattened villi mucosa in the terminal ileum.     Pneumonia    [place]   s/p penicillamine, plaquenil, MTX, sulfasalazine.  s/p Embrel, Humira,.  Iritis.     Pure hypercholesterolemia    Past Surgical History:  Procedure Laterality Date   ABDOMINAL HYSTERECTOMY  1981   ovaries left in place   ANKLE SURGERY     right   BUNIONECTOMY     CHOLECYSTECTOMY  1989   COLONOSCOPY WITH PROPOFOL N/A 12/27/2017   Procedure: COLONOSCOPY WITH PROPOFOL;  Surgeon: VaLin LandsmanMD;  [facility];  Service: Gastroenterology;  Laterality: N/A;   COLONOSCOPY WITH PROPOFOL N/A 01/04/2019   Procedure: COLONOSCOPY WITH PROPOFOL;  Surgeon: VaLin LandsmanMD;  [facility];  Laterality: N/A;  Diabetic - oral and injectable   ESOPHAGOGASTRODUODENOSCOPY (EGD) WITH PROPOFOL N/A 12/27/2017   Procedure: ESOPHAGOGASTRODUODENOSCOPY (EGD) WITH PROPOFOL;  Surgeon: VaLin LandsmanMD;  [facility];  Service: Gastroenterology;  Laterality: N/A;   HAND SURGERY     right   HEEL SPUR SURGERY     NOSE SURGERY     x2   POLYPECTOMY  01/04/2019   Procedure: POLYPECTOMY;  Surgeon: VaLin LandsmanMD;  Location: [place];  Service: Endoscopy;;   SHOULDER ARTHROSCOPY WITH OPEN ROTATOR CUFF REPAIR Left 09/20/2018   Procedure: SHOULDER ARTHROSCOPY WITH OPEN ROTATOR CUFF REPAIR;  Surgeon: Corky Mull, MD;  Location[facility];  Service: Orthopedics;  Laterality: Left;   SHOULDER CLOSED REDUCTION Left 12/21/2018   Procedure: MANIPULATION UNDER ANESTHESIA WITH STEROID INJECTION;  Surgeon: Corky Mull, MD;  Location: [place];  Service: Orthopedics;  Laterality: Left;  Diabetic - insulin and oral meds   UMBILICAL HERNIA REPAIR     Family History  Problem Relation Age of Onset   Diabetes  Other    Hypertension Other    Breast cancer Neg Hx    Colon cancer Neg Hx    Social History   Socioeconomic History   Marital status: Married    Spouse name: Not on file   Number of children: 2   Years of education: Not on file   Highest education level: Not on file  Occupational History   Not on file  Tobacco Use   Smoking status: Never   Smokeless tobacco: Never  Vaping Use   Vaping Use: Never used  Substance and Sexual Activity   Alcohol use: No    Alcohol/week: 0.0 standard drinks   Drug use: No   Sexual activity: Not Currently  Other Topics Concern   Not on file  Social History Narrative   She is married and has two children   Social Determinants of Health   Financial Resource Strain: Medium Risk   Difficulty of Paying Living Expenses: Somewhat hard  Food Insecurity: No Food Insecurity   Worried About Charity fundraiser in the Last Year: Never true   Ran Out of Food in the Last Year: Never true  Transportation Needs: No Transportation Needs   Lack of Transportation (Medical): No   Lack of Transportation (Non-Medical): No  Physical Activity: Not on file  Stress: No Stress Concern Present   Feeling of Stress : Only a little  Social Connections: Unknown   Frequency of Communication with Friends and Family: More than three times a week   Frequency of Social Gatherings with Friends and Family: Not on file   Attends Religious Services: Not on file   Active Member of Clubs or Organizations: Not on file   Attends Archivist Meetings: Not on file   Marital Status: Not on file    Review of Systems  Constitutional:        Decreased weight.  Gets full fast.    HENT:  Negative for congestion and sinus pressure.        Intermittent ear fullness as outlined.   Respiratory:  Negative for cough, chest tightness and shortness of breath.   Cardiovascular:  Negative for chest pain, palpitations and leg swelling.  Gastrointestinal:  Negative for abdominal pain,  nausea and vomiting.  Genitourinary:  Negative for difficulty urinating and dysuria.  Musculoskeletal:  Negative for joint swelling and myalgias.  Skin:  Negative for color change and rash.  Neurological:  Negative for dizziness, light-headedness and headaches.  Psychiatric/Behavioral:  Negative for agitation and dysphoric mood.        Increased stress as outlined.       Objective:    Physical Exam Vitals reviewed.  Constitutional:      General: She is not in acute distress.    Appearance: Normal appearance.  HENT:     Head: Normocephalic and atraumatic.     Right Ear: Tympanic membrane,  ear canal and external ear normal. There is no impacted cerumen.     Left Ear: Tympanic membrane, ear canal and external ear normal. There is no impacted cerumen.  Eyes:     General: No scleral icterus.       Right eye: No discharge.        Left eye: No discharge.     Conjunctiva/sclera: Conjunctivae normal.  Neck:     Thyroid: No thyromegaly.  Cardiovascular:     Rate and Rhythm: Normal rate and regular rhythm.  Pulmonary:     Effort: No respiratory distress.     Breath sounds: Normal breath sounds. No wheezing.  Abdominal:     General: Bowel sounds are normal.     Palpations: Abdomen is soft.     Tenderness: There is no abdominal tenderness.  Musculoskeletal:        General: No swelling or tenderness.     Cervical back: Neck supple. No tenderness.  Lymphadenopathy:     Cervical: No cervical adenopathy.  Skin:    Findings: No erythema or rash.  Neurological:     Mental Status: She is alert.  Psychiatric:        Mood and Affect: Mood normal.        Behavior: Behavior normal.    BP 114/68   Pulse 88   Temp 97.8 F (36.6 C)   Resp 16   Ht '5\' 4"'$  (1.626 m)   Wt 121 lb (54.9 kg)   SpO2 99%   BMI 20.77 kg/m  Wt Readings from Last 3 Encounters:  05/23/21 121 lb (54.9 kg)  04/02/21 126 lb 11.2 oz (57.5 kg)  03/13/21 135 lb (61.2 kg)    Outpatient Encounter Medications as of  05/23/2021  Medication Sig   acetaminophen (TYLENOL) 325 MG tablet Take 650 mg by mouth every 6 (six) hours as needed.   albuterol (PROVENTIL) (2.5 MG/3ML) 0.083% nebulizer solution Take 3 mLs (2.5 mg total) by nebulization every 4 (four) hours as needed for wheezing or shortness of breath.   amLODipine (NORVASC) 5 MG tablet TAKE 1 TABLET(5 MG) BY MOUTH TWICE DAILY   budesonide-formoterol (SYMBICORT) 160-4.5 MCG/ACT inhaler Inhale 2 puffs into the lungs 2 (two) times daily.   Calcium Carbonate-Vit D-Min (CALCIUM 600+D PLUS MINERALS) 600-400 MG-UNIT TABS Take by mouth.   Cholecalciferol (VITAMIN D-3) 1000 UNITS CAPS Take 2 capsules by mouth daily.   cyclobenzaprine (FLEXERIL) 10 MG tablet TAKE 1 TABLET BY MOUTH EVERY NIGHT AS NEEDED   erythromycin ophthalmic ointment Place 1 application into the left eye 4 (four) times daily. X 4-7 days   HYDROcodone bit-homatropine (HYCODAN) 5-1.5 MG/5ML syrup Take 5 mLs by mouth at bedtime as needed for cough.   insulin degludec (TRESIBA FLEXTOUCH) 100 UNIT/ML FlexTouch Pen Inject 18 Units into the skin daily.   losartan (COZAAR) 100 MG tablet TAKE 1 TABLET BY MOUTH EVERY DAY   mirtazapine (REMERON) 15 MG tablet TAKE 1 TO 2 TABLETS BY MOUTH AT BEDTIME   omeprazole (PRILOSEC) 20 MG capsule TAKE 1 CAPSULE(20 MG) BY MOUTH TWICE DAILY   ONE TOUCH ULTRA TEST test strip PATIENT NEEDS NEW METER STRIPS AND LANCETS FOR ONE TOUCH. HER INSURANCE NO LONGER COVERS ACCUCHEK.   predniSONE (DELTASONE) 20 MG tablet 40 mg x 4 days, 20 mg x 3 days, 10 mg x 2 days with breakfast in the am   rosuvastatin (CRESTOR) 10 MG tablet TAKE 1 TABLET(10 MG) BY MOUTH DAILY   Semaglutide,0.25 or 0.'5MG'$ /DOS, (OZEMPIC, 0.25 OR  0.5 MG/DOSE,) 2 MG/1.5ML SOPN Inject 0.5 mg weekly   sodium chloride (OCEAN) 0.65 % SOLN nasal spray Place 2 sprays into both nostrils as needed for congestion.   [DISCONTINUED] potassium chloride (KLOR-CON) 10 MEQ tablet TAKE 1 TABLET(10 MEQ) BY MOUTH TWICE DAILY (Patient  not taking: Reported on 04/02/2021)   [DISCONTINUED] triamterene-hydrochlorothiazide (MAXZIDE-25) 37.5-25 MG tablet TAKE 1/2 TABLET BY MOUTH DAILY (Patient not taking: Reported on 04/02/2021)   No facility-administered encounter medications on file as of 05/23/2021.     Lab Results  Component Value Date   WBC 11.1 (H) 04/09/2021   HGB 11.6 (L) 04/09/2021   HCT 35.5 (L) 04/09/2021   PLT 397.0 04/09/2021   GLUCOSE 52 (L) 03/21/2021   CHOL 114 03/21/2021   TRIG 79.0 03/21/2021   HDL 44.30 03/21/2021   LDLDIRECT 74.0 04/19/2019   LDLCALC 54 03/21/2021   ALT 12 03/21/2021   AST 18 03/21/2021   NA 142 03/21/2021   K 3.8 04/22/2021   CL 104 03/21/2021   CREATININE 0.92 03/21/2021   BUN 10 03/21/2021   CO2 30 03/21/2021   TSH 1.83 11/13/2020   HGBA1C 5.4 03/21/2021   MICROALBUR 2.0 (H) 11/13/2020    MM 3D SCREEN BREAST BILATERAL  Result Date: 03/24/2021 CLINICAL DATA:  Screening. EXAM: DIGITAL SCREENING BILATERAL MAMMOGRAM WITH TOMOSYNTHESIS AND CAD TECHNIQUE: Bilateral screening digital craniocaudal and mediolateral oblique mammograms were obtained. Bilateral screening digital breast tomosynthesis was performed. The images were evaluated with computer-aided detection. COMPARISON:  Previous exam(s). ACR Breast Density Category b: There are scattered areas of fibroglandular density. FINDINGS: There are no findings suspicious for malignancy. The images were evaluated with computer-aided detection. IMPRESSION: No mammographic evidence of malignancy. A result letter of this screening mammogram will be mailed directly to the patient. RECOMMENDATION: Screening mammogram in one year. (Code:SM-B-01Y) BI-RADS CATEGORY  1: Negative. Electronically Signed   By: Ammie Ferrier M.D.   On: 03/24/2021 14:41       Assessment & Plan:   Problem List Items Addressed This Visit     Asthma    Breathing stable.        Controlled type 2 diabetes mellitus with complication, with long-term current use of  insulin ([place])    She is currently on ozempic and tresiba.  Weight continues to decrease. Sugars as outlined.  71 this am. Most readings 89-100.  Given decreased appetite and continued weight loss, will decrease ozempic to .25.  Also decrease tresiba by 2 units.  Follow sugars.  Notify me of readings.  Discussed continued decrease of tresiba - by 2 units - pending sugar readings.  Follow closely.        Ear fullness    No cerumen impaction.  TM appears normal.  Flonase.  Follow.        GERD (gastroesophageal reflux disease)    No upper symptoms.  On omeprazole.         Hypercholesterolemia    Continue crestor. Low cholesterol diet and exercise.  Follow lipid panel and liver function tests.         Hypertension    Continue amlodipine and losartan.  Blood pressure doing well off triam/hctz.  Follow pressures.  Follow metabolic panel.        Inflammatory polyps of colon ([place])    Had previous colonoscopy with removal of multiple polyps.  Has f/u with Dr Marius Ditch 06/20/21.         Mild depression ([facility])    Trying to cope with her husband's  passing.  Continue remeron.        Psoriatic arthritis ([place])    Stable.  Followed by Dr Jefm Bryant.        Rectal bleeding    S/p colonoscopy with removal of multiple polyps.  S/p hemorrhoid ligation.  Still with some intermittent bleeding.  Continue f/u with GI. Follow cbc.        Stress    Continues on remeron.  Has good support.  Follow.        Other Visit Diagnoses     Low glucose level    -  Primary   Relevant Orders   POCT Glucose (CBG) (Completed)        Einar Pheasant, MD

## 2021-05-25 ENCOUNTER — Encounter: Payer: Self-pay | Admitting: Internal Medicine

## 2021-05-25 DIAGNOSIS — H938X9 Other specified disorders of ear, unspecified ear: Secondary | ICD-10-CM | POA: Insufficient documentation

## 2021-05-25 NOTE — Assessment & Plan Note (Signed)
Trying to cope with her husband's passing.  Continue remeron.

## 2021-05-25 NOTE — Assessment & Plan Note (Signed)
Breathing stable.

## 2021-05-25 NOTE — Assessment & Plan Note (Signed)
No cerumen impaction.  TM appears normal.  Flonase.  Follow.

## 2021-05-25 NOTE — Assessment & Plan Note (Signed)
Had previous colonoscopy with removal of multiple polyps.  Has f/u with Dr Marius Ditch 06/20/21.

## 2021-05-25 NOTE — Assessment & Plan Note (Signed)
No upper symptoms.  On omeprazole.  

## 2021-05-25 NOTE — Assessment & Plan Note (Signed)
Stable.  Followed by Dr Kernodle.   

## 2021-05-25 NOTE — Assessment & Plan Note (Signed)
Continue amlodipine and losartan.  Blood pressure doing well off triam/hctz.  Follow pressures.  Follow metabolic panel.

## 2021-05-25 NOTE — Assessment & Plan Note (Signed)
S/p colonoscopy with removal of multiple polyps.  S/p hemorrhoid ligation.  Still with some intermittent bleeding.  Continue f/u with GI. Follow cbc.

## 2021-05-25 NOTE — Assessment & Plan Note (Signed)
Continues on remeron.  Has good support.  Follow.

## 2021-05-25 NOTE — Assessment & Plan Note (Signed)
She is currently on ozempic and tresiba.  Weight continues to decrease. Sugars as outlined.  71 this am. Most readings 89-100.  Given decreased appetite and continued weight loss, will decrease ozempic to .25.  Also decrease tresiba by 2 units.  Follow sugars.  Notify me of readings.  Discussed continued decrease of tresiba - by 2 units - pending sugar readings.  Follow closely.

## 2021-05-25 NOTE — Assessment & Plan Note (Signed)
Continue crestor.  Low cholesterol diet and exercise. Follow lipid panel and liver function tests.   

## 2021-05-26 ENCOUNTER — Telehealth: Payer: Self-pay | Admitting: Pharmacist

## 2021-05-26 ENCOUNTER — Telehealth: Payer: PPO

## 2021-05-26 NOTE — Telephone Encounter (Signed)
  Chronic Care Management   Note  05/26/2021 Name: Ashley Gross MRN: [id number] DOB: June 12, 1951   Attempted to contact patient for scheduled appointment for medication management support. Unable to leave voicemail. She saw PCP last week and had medications adjusted. Will reschedule our follow up phone call for ~ 4 weeks from now.    [name] Darnelle Maffucci, PharmD, [place], [place] Clinical Pharmacist [employer] at [place]

## 2021-05-28 ENCOUNTER — Other Ambulatory Visit: Payer: Self-pay | Admitting: Internal Medicine

## 2021-06-04 DIAGNOSIS — M778 Other enthesopathies, not elsewhere classified: Secondary | ICD-10-CM | POA: Diagnosis not present

## 2021-06-04 DIAGNOSIS — L909 Atrophic disorder of skin, unspecified: Secondary | ICD-10-CM | POA: Diagnosis not present

## 2021-06-04 DIAGNOSIS — Q6689 Other  specified congenital deformities of feet: Secondary | ICD-10-CM | POA: Diagnosis not present

## 2021-06-09 ENCOUNTER — Encounter: Payer: Self-pay | Admitting: Internal Medicine

## 2021-06-09 DIAGNOSIS — H938X3 Other specified disorders of ear, bilateral: Secondary | ICD-10-CM

## 2021-06-10 NOTE — Telephone Encounter (Signed)
Order placed for ENT referral.   

## 2021-06-19 ENCOUNTER — Telehealth: Payer: Self-pay

## 2021-06-19 DIAGNOSIS — E1121 Type 2 diabetes mellitus with diabetic nephropathy: Secondary | ICD-10-CM

## 2021-06-19 MED ORDER — OZEMPIC (0.25 OR 0.5 MG/DOSE) 2 MG/1.5ML ~~LOC~~ SOPN: PEN_INJECTOR | SUBCUTANEOUS | Status: DC

## 2021-06-19 NOTE — Telephone Encounter (Signed)
Patient assistance ozempic received, labeled and placed in fridge. LM for patient to let her know.  Per last note from Dr Nicki Reaper, Ashley Gross was decreased to 0.'25mg'$  weekly. New directions listed.

## 2021-06-20 ENCOUNTER — Ambulatory Visit: Payer: PPO | Admitting: Gastroenterology

## 2021-06-24 ENCOUNTER — Telehealth: Payer: Self-pay | Admitting: Pharmacist

## 2021-06-24 ENCOUNTER — Telehealth: Payer: PPO

## 2021-06-24 NOTE — Telephone Encounter (Signed)
  Chronic Care Management   Note  06/24/2021 Name: KHALISE BIZZLE MRN: [id number] DOB: 01-12-1951   Attempted to contact patient for scheduled appointment for medication management support. Left HIPAA compliant message for patient to return my call at their convenience.    Plan: - If I do not hear back from the patient by end of business today, will collaborate with Care Guide to outreach to schedule follow up with me   [name] Darnelle Maffucci, PharmD, [place], [place] Pharmacist [employer] at [employer] [phone]

## 2021-06-26 ENCOUNTER — Telehealth: Payer: PPO

## 2021-06-27 ENCOUNTER — Telehealth: Payer: Self-pay

## 2021-06-27 ENCOUNTER — Other Ambulatory Visit: Payer: Self-pay | Admitting: Internal Medicine

## 2021-06-27 NOTE — Chronic Care Management (AMB) (Signed)
  Care Management   Note  06/27/2021 Name: STACI DOONAN MRN: [id number] DOB: December 27, 1950  [name] is a 70 y.o. year old female who is a primary care patient of Einar Pheasant, MD and is actively engaged with the care management team. I reached out to [name] by phone today to assist with re-scheduling a follow up visit with the Pharmacist  Follow up plan: Unsuccessful telephone outreach attempt made. A HIPAA compliant phone message was left for the patient providing contact information and requesting a return call.  The care management team will reach out to the patient again over the next 7 days.  If patient returns call to provider office, please advise to call [facility]  at [place], [place] [address], [place], [place] [address] Direct Dial: [phone] [email or web address] Website: [email or web address]

## 2021-06-30 NOTE — Telephone Encounter (Signed)
Patient has picked up ozempic

## 2021-07-03 NOTE — Chronic Care Management (AMB) (Signed)
  Care Management   Note  07/03/2021 Name: Ashley Gross MRN: [id number] DOB: 04-Jul-1951  Ashley Gross is a 70 y.o. year old female who is a primary care patient of Einar Pheasant, MD and is actively engaged with the care management team. I reached out to Ashley Gross by phone today to assist with re-scheduling a follow up visit with the Pharmacist  Follow up plan: Unsuccessful telephone outreach attempt made. A HIPAA compliant phone message was left for the patient providing contact information and requesting a return call.  The care management team will reach out to the patient again over the next 7 days.  If patient returns call to provider office, please advise to call [facility]  at [place], [address], [place], [place] [address] Direct Dial: [phone] [email or web address] Website: [email or web address]

## 2021-07-04 DIAGNOSIS — R42 Dizziness and giddiness: Secondary | ICD-10-CM | POA: Diagnosis not present

## 2021-07-04 DIAGNOSIS — H903 Sensorineural hearing loss, bilateral: Secondary | ICD-10-CM | POA: Diagnosis not present

## 2021-07-11 NOTE — Telephone Encounter (Signed)
Pt has been rescheduled. 

## 2021-07-11 NOTE — Chronic Care Management (AMB) (Signed)
  Care Management   Note  07/11/2021 Name: Ashley Gross MRN: [id number] DOB: September 11, 1951  Ashley Gross is a 70 y.o. year old female who is a primary care patient of Einar Pheasant, MD and is actively engaged with the care management team. I reached out to Ashley Gross by phone today to assist with re-scheduling a follow up visit with the Pharmacist  Follow up plan: Telephone appointment with care management team member scheduled for:08/08/2021   Noreene Larsson, [place], [place], [place] [address] Direct Dial: [phone] [email or web address] Website: [email or web address]

## 2021-07-13 ENCOUNTER — Other Ambulatory Visit: Payer: Self-pay | Admitting: Internal Medicine

## 2021-07-17 ENCOUNTER — Ambulatory Visit: Payer: PPO | Admitting: Gastroenterology

## 2021-07-17 ENCOUNTER — Encounter: Payer: Self-pay | Admitting: Gastroenterology

## 2021-07-17 ENCOUNTER — Telehealth: Payer: Self-pay | Admitting: Internal Medicine

## 2021-07-17 ENCOUNTER — Other Ambulatory Visit: Payer: Self-pay

## 2021-07-17 VITALS — BP 119/73 | HR 86 | Temp 87.9°F | Ht 64.0 in | Wt 121.2 lb

## 2021-07-17 DIAGNOSIS — R634 Abnormal weight loss: Secondary | ICD-10-CM | POA: Diagnosis not present

## 2021-07-17 DIAGNOSIS — K51419 Inflammatory polyps of colon with unspecified complications: Secondary | ICD-10-CM

## 2021-07-17 DIAGNOSIS — Z86010 Personal history of colonic polyps: Secondary | ICD-10-CM

## 2021-07-17 MED ORDER — CLENPIQ 10-3.5-12 MG-GM -GM/160ML PO SOLN: 320.0000 mL | Freq: Once | ORAL | 0 refills | Status: AC

## 2021-07-17 NOTE — Telephone Encounter (Signed)
Please notify Ms Smothermon that I reviewed notes from GI and given her persistent GI issues and weight loss, I would like for her to stop her ozempic.  Follow sugars.  Keep [place] posted on how she is doing.

## 2021-07-17 NOTE — Patient Instructions (Signed)
Your CT scan is scheduled for 08/06/2021 arrive to medical mall  at [facility] at 7:45am for a 8:00am scan. Nothing to eat or drink after midnight. Please pick up oral contrast before the scan. They will give you instructions on how to drink it.

## 2021-07-17 NOTE — Progress Notes (Signed)
Cephas Darby, MD [address]  [place]  [place], [place] [address]  Main: [phone]  Fax: [phone]    Gastroenterology Consultation  Referring Provider:     Einar Pheasant, MD Primary Care Physician:  Einar Pheasant, MD Primary Gastroenterologist:  Dr. Cephas Darby Reason for Consultation:   Unexplained weight loss, loose stools, inflammatory polyposis of the colon        HPI:   Ashley Gross is a 70 y.o. white female referred by Dr. Einar Pheasant, MD  for consultation & management of chronic diarrhea and rectal bleeding. She has known h/o moderate exocrine EPI, IBS-D, inflammatory polyps in colon was originally seeing Dr Redmond Pulling at [facility] until 2014. She is referred here due to change in her insurance. Has not seen Dr Redmond Pulling since 2014. She had extensive work up including EGD/colonoscopy/VCE and unremarkable biopsies. TTG IgA negative, stool WBCs negative, infectious work up negative, normal serum gastrin. Pancreatic elastase levels were 149 in 01/2013. She was on creon in the past which helped, stopped several years ago. Other medications she tried were cholestyramine, imodium, levsin, dicyclomine '20mg'$ .  She had cholecystectomy several years ago. She had MRI pancreas protocol in 05/2013 which showed normal pancreas  She reports several years h/o chronic non-bloody diarrhea anywhere from 5to10BMs/day or upto 20/day during exacerbations. Denies nocturnal diarrhea or leakage or incontinence. She has been experiencing rectal bleeding since Feb or march 2018, once every 1-2 weeks. Currently, experiencing rectal bleeding every day since 3 months. Seeing blood mixed with stools and sometimes only with mucus. She showed pictures of these episodes on her phone. Her BMs are always loose, 5-6 times/day or even more. She does have severe abdominal cramps a/w diarrhea and rectal bleeding. She lost weight in the past, stable in last 1-2 yrs. She denies n/v/f/c. Her diabetes is  controlled. She can't tolerate most of the foods, has BM immediately after eating. She denies bloating. She has not tried low-FODMAPs diet She had symptomatic hemorrhoids in the past, previously itchy, painful. She denies these symptoms at present other than internal hemorrhoids. She takes PPI as needed only. She denies tobacco, ETOH, NSAID use She does not have anemia  She has h/o psoriatic arthritis, previously on remicade (developed severe myalgias), plaquenil, pencillamine, humira, embrel. Off therapy since 04/2013 She also underwent outpatient hemorrhoid ligation of all 3 hemorrhoids.  She does not have pancreatic insufficiency based on her pancreatic fecal elastase levels, therefore I stopped her pancreatic enzymes.  Follow-up visit 07/17/2021 Patient is here for follow-up of unexplained weight loss and to discuss about colonoscopy for history of inflammatory polyposis which has led to iron deficiency anemia and chronic diarrhea.  There is no evidence of underlying malignancy or inflammatory bowel disease.Marland Kitchen  Her last colonoscopy in 2020, a large inflammatory polyp was completely resected.  Patient has not seen me for last 2 years.  Unfortunately, her husband passed away in 11-22-2020, since then patient has been grieving and has lost a significant amount of weight.  Patient weighed 157 pounds in 11/22/2020, current weight is 121 pounds.  She thinks her weight loss is mostly from demise of her husband.  Her weight has been stable for last 4 weeks.  She reports that her appetite is okay and she does have early satiety.  Her 15 year old granddaughter lives with her currently.  She just joined the program to cope up with her husband's loss, she is also started on Ozempic for her diabetes.  Her hemoglobin  A1c is 5.4.  Normal TSH.  Mild anemia, no evidence of iron, B12 or folate deficiency  GI Procedures:  Colonoscopy 01/04/2019 - Hemorrhoids found on perianal exam. - The examined portion of the ileum was  normal. - Multiple 10 to 30 mm, recently bleeding polyps in the sigmoid colon and in the descending colon, removed with a hot snare. Polyp resection was incomplete, and the resected tissue was partially retrieved.     Colonoscopy 04/06/2007 Indications:  last colonoscopy 2001, anemia. Impression: -Congested mucosa in the entire colon. -Nodules in the proximal ascending colon, biopsied. -Nodule in the distal ascending colon, biopsied. -Congested erythematous and friable (with contact bleeding) mucosa in the  ascending colon and in the cecum. . "CECUM" (ENDOSCOPIC BIOPSY)     COLONIC MUCOSA WITH NO PATHOLOGIC DIAGNOSIS.     NO ACTIVE OR CHRONIC COLITIS IS SEEN.     NO EVIDENCE OF LYMPHOCYTIC OR COLLAGENOUS COLITIS IS SEEN. B. "RIGHT COLON NODULES" (ENDOSCOPIC BIOPSY):     INFLAMMATORY POLYPS. C. "RECTOSIGMOID COLON" (ENDOSCOPIC BIOPSY):     COLONIC MUCOSA WITH NO PATHOLOGIC DIAGNOSIS.     NO ACTIVE OR CHRONIC COLITIS IS SEEN.     NO EVIDENCE OF LYMPHOCYTIC OR COLLAGENOUS COLITIS IS SEEN. D. "DISTAL ASCENDING COLON" (ENDOSCOPIC BIOPSY):     INFLAMMATORY POLYPS. ------------------------------------------------------------------------------------------------- Colonoscopy 2014  A. "ILEUM" (ENDOSCOPIC BIOPSY):     SMALL INTESTINAL MUCOSA WITH NO PATHOLOGIC DIAGNOSIS.     NO ACTIVE OR CHRONIC ENTERITIS IS SEEN.     NO GRANULOMATOUS INFLAMMATION IS SEEN. B. "COLON, ILEOCECAL VALVE NODULE" (BIOPSY):     COLONIC MUCOSA, WITH REACTIVE CHANGES.     NO ADENOMATOUS MUCOSA IS IDENTIFIED. C. "RIGHT COLON" (ENDOSCOPIC BIOPSY):     COLONIC MUCOSA WITH NO PATHOLOGIC DIAGNOSIS.     NO ACTIVE OR CHRONIC COLITIS IS SEEN.     NO EVIDENCE OF LYMPHOCYTIC OR COLLAGENOUS COLITIS IS SEEN. D. "HEPATIC FLEXURE MASS" (ENDOSCOPIC BIOPSY):     INFLAMMATORY POLYP.     NO ADENOMATOUS MUCOSA IS SEEN. E. "TRANSVERSE COLON" (ENDOSCOPIC BIOPSY):     COLONIC MUCOSA WITH NO PATHOLOGIC DIAGNOSIS.     NO ACTIVE OR  CHRONIC COLITIS IS SEEN.     NO EVIDENCE OF LYMPHOCYTIC OR COLLAGENOUS COLITIS IS SEEN. F. "LEFT COLON" (ENDOSCOPIC BIOPSY):     COLONIC MUCOSA WITH NO PATHOLOGIC DIAGNOSIS.     NO ACTIVE OR CHRONIC COLITIS IS SEEN.     NO EVIDENCE OF LYMPHOCYTIC OR COLLAGENOUS COLITIS IS SEEN.     A MINUTE HYPERPLASTIC POLYP IS ALSO PRESENT. G. "LEFT COLON POLYP(S)" (ENDOSCOPIC POLYPECTOMY):     TUBULAR ADENOMA.     NO HIGH GRADE DYSPLASIA OR CARCINOMA IS SEEN.  Colonoscopy 12/27/2017 - The examined portion of the ileum was normal. Biopsied. - One 12 mm polyp in the ascending colon, removed with a hot snare. Resected and retrieved. - One 40 mm polyp in the transverse colon, removed with a hot snare. Incomplete resection. Resected tissue retrieved. - A few 8 to 15 mm, non-bleeding polyps in the sigmoid colon. Resection not attempted. - The distal rectum and anal verge are normal on retroflexion view.   Upper endoscopy 12/27/2017 - Normal duodenal bulb and second portion of the duodenum. Biopsied. - Erythematous mucosa in the gastric body and antrum. - Normal cardia, gastric fundus and incisura. Biopsied. - Normal gastroesophageal junction and esophagus.  DIAGNOSIS:  A. DUODENUM; COLD BIOPSY:  - DUODENAL MUCOSA WITH PRESERVED VILLOUS ARCHITECTURE AND BRUNNER'S  GLAND HYPERPLASIA.  - NEGATIVE  FOR INTRA-EPITHELIAL LYMPHOCYTOSIS, DYSPLASIA AND MALIGNANCY.   B.  STOMACH; COLD BIOPSY:  - ANTRAL AND OXYNTIC MUCOSA WITH MILD CHRONIC GASTRITIS.  - NEGATIVE FOR H. PYLORI, DYSPLASIA AND MALIGNANCY.   C.  TERMINAL ILEUM; COLD BIOPSY:  - SMALL BOWEL MUCOSA WITH INTACT VILLOUS ARCHITECTURE.  - NEGATIVE FOR INTRAEPITHELIAL LYMPHOCYTOSIS, DYSPLASIA AND MALIGNANCY.   D.  RANDOM RIGHT COLON; COLD BIOPSY:  - COLONIC MUCOSA NEGATIVE FOR MICROSCOPIC COLITIS, DYSPLASIA AND  MALIGNANCY.    E.  COLON POLYP, ASCENDING; HOT SNARE:  - INFLAMMATORY-TYPE POLYP.   F.  COLON POLYP, TRANSVERSE; HOT SNARE:  -  INFLAMMATORY-TYPE POLYP.   G.  RANDOM TRANSVERSE COLON; COLD BIOPSY:  - COLONIC MUCOSA NEGATIVE FOR MICROSCOPIC COLITIS, DYSPLASIA AND  MALIGNANCY.   Past Medical History:  Diagnosis Date   Abnormal liver function test 10/06/2012   Anemia, unspecified    Asthma    Colitis    Complication of anesthesia    asthma attack after shoulder surgery   Diabetes mellitus ([place])    Esophagitis    H/O hiatal hernia    Heart murmur    Hx of arteriovenous malformation (AVM)    Hypertension    IBS (irritable bowel syndrome)    Nephrolithiasis    Pernicious anemia    Personal history of colonic polyps 02/10/2013   02/08/13 colonoscopy - question of rectal varices, two 3-26m polyps in the sigmoid colon, mass at the hepatic flexure, one 584mpolyp at the hepatic flexure, nodule at the ileocecal valve, congested mucusa in the entire colon, flattened villi mucosa in the terminal ileum.     Pneumonia    Psoriatic arthritis ([place]   s/p penicillamine, plaquenil, MTX, sulfasalazine.  s/p Embrel, Humira,.  Iritis.     Pure hypercholesterolemia     Past Surgical History:  Procedure Laterality Date   ABDOMINAL HYSTERECTOMY  1981   ovaries left in place   ANKLE SURGERY     right   BUNIONECTOMY     CHOLECYSTECTOMY  1989   COLONOSCOPY WITH PROPOFOL N/A 12/27/2017   Procedure: COLONOSCOPY WITH PROPOFOL;  Surgeon: VaLin LandsmanMD;  [facility];  Service: Gastroenterology;  Laterality: N/A;   COLONOSCOPY WITH PROPOFOL N/A 01/04/2019   Procedure: COLONOSCOPY WITH PROPOFOL;  Surgeon: VaLin LandsmanMD;  [facility];  Laterality: N/A;  Diabetic - oral and injectable   ESOPHAGOGASTRODUODENOSCOPY (EGD) WITH PROPOFOL N/A 12/27/2017   Procedure: ESOPHAGOGASTRODUODENOSCOPY (EGD) WITH PROPOFOL;  Surgeon: VaLin LandsmanMD;  [facility];  Service: Gastroenterology;  Laterality: N/A;   HAND SURGERY     right   HEEL SPUR SURGERY     NOSE  SURGERY     x2   POLYPECTOMY  01/04/2019   Procedure: POLYPECTOMY;  Surgeon: VaLin LandsmanMD;  [place];;   SHOULDER ARTHROSCOPY WITH OPEN ROTATOR CUFF REPAIR Left 09/20/2018   Procedure: SHOULDER ARTHROSCOPY WITH OPEN ROTATOR CUFF REPAIR;  Surgeon: PoCorky MullMD;  Location[facility];  Service: Orthopedics;  Laterality: Left;   SHOULDER CLOSED REDUCTION Left 12/21/2018   Procedure: MANIPULATION UNDER ANESTHESIA WITH STEROID INJECTION;  Surgeon: PoCorky MullMD;  [facility];  Laterality: Left;  Diabetic - insulin and oral meds   UMBILICAL HERNIA REPAIR       Current Outpatient Medications:    acetaminophen (TYLENOL) 325 MG tablet, Take 650 mg by mouth every 6 (six) hours as needed.,  Disp: , Rfl:    albuterol (PROVENTIL) (2.5 MG/3ML) 0.083% nebulizer solution, Take 3 mLs (2.5 mg total) by nebulization every 4 (four) hours as needed for wheezing or shortness of breath., Disp: 360 mL, Rfl: 12   amLODipine (NORVASC) 5 MG tablet, TAKE 1 TABLET(5 MG) BY MOUTH TWICE DAILY, Disp: 180 tablet, Rfl: 1   budesonide-formoterol (SYMBICORT) 160-4.5 MCG/ACT inhaler, Inhale 2 puffs into the lungs 2 (two) times daily., Disp: 3 each, Rfl: 3   Cholecalciferol (VITAMIN D-3) 1000 UNITS CAPS, Take 2 capsules by mouth daily., Disp: , Rfl:    cyclobenzaprine (FLEXERIL) 10 MG tablet, TAKE 1 TABLET BY MOUTH EVERY NIGHT AS NEEDED, Disp: 30 tablet, Rfl: 0   erythromycin ophthalmic ointment, Place 1 application into the left eye 4 (four) times daily. X 4-7 days, Disp: 3.5 g, Rfl: 0   HYDROcodone bit-homatropine (HYCODAN) 5-1.5 MG/5ML syrup, Take 5 mLs by mouth at bedtime as needed for cough., Disp: 120 mL, Rfl: 0   insulin degludec (TRESIBA FLEXTOUCH) 100 UNIT/ML FlexTouch Pen, Inject 18 Units into the skin daily., Disp: 12 mL, Rfl: 2   losartan (COZAAR) 100 MG tablet, TAKE 1 TABLET BY MOUTH EVERY DAY, Disp: 90 tablet, Rfl: 1    mirtazapine (REMERON) 15 MG tablet, TAKE 1 TO 2 TABLETS BY MOUTH AT BEDTIME, Disp: 60 tablet, Rfl: 1   omeprazole (PRILOSEC) 20 MG capsule, TAKE 1 CAPSULE(20 MG) BY MOUTH TWICE DAILY, Disp: 180 capsule, Rfl: 3   ONE TOUCH ULTRA TEST test strip, PATIENT NEEDS NEW METER STRIPS AND LANCETS FOR ONE TOUCH. HER INSURANCE NO LONGER COVERS ACCUCHEK., Disp: 100 each, Rfl: PRN   rosuvastatin (CRESTOR) 10 MG tablet, TAKE 1 TABLET(10 MG) BY MOUTH DAILY, Disp: 90 tablet, Rfl: 1   Semaglutide,0.25 or 0.'5MG'$ /DOS, (OZEMPIC, 0.25 OR 0.5 MG/DOSE,) 2 MG/1.5ML SOPN, Inject 0.25 mg weekly, Disp: , Rfl:    Sod Picosulfate-Mag Ox-Cit Acd (CLENPIQ) 10-3.5-12 MG-GM -GM/160ML SOLN, Take 320 mLs by mouth once for 1 dose., Disp: 320 mL, Rfl: 0   sodium chloride (OCEAN) 0.65 % SOLN nasal spray, Place 2 sprays into both nostrils as needed for congestion., Disp: 30 mL, Rfl: 2   Calcium Carbonate-Vit D-Min (CALCIUM 600+D PLUS MINERALS) 600-400 MG-UNIT TABS, Take by mouth. (Patient not taking: Reported on 07/17/2021), Disp: , Rfl:    predniSONE (DELTASONE) 20 MG tablet, 40 mg x 4 days, 20 mg x 3 days, 10 mg x 2 days with breakfast in the am (Patient not taking: Reported on 07/17/2021), Disp: 12 tablet, Rfl: 0   Family History  Problem Relation Age of Onset   Diabetes Other    Hypertension Other    Breast cancer Neg Hx    Colon cancer Neg Hx      Social History   Tobacco Use   Smoking status: Never   Smokeless tobacco: Never  Vaping Use   Vaping Use: Never used  Substance Use Topics   Alcohol use: No    Alcohol/week: 0.0 standard drinks   Drug use: No    Allergies as of 07/17/2021 - Review Complete 07/17/2021  Allergen Reaction Noted   Aspirin Other (See Comments) 10/06/2012   Beta adrenergic blockers  10/06/2012   Lisinopril  12/21/2018   Mtx support [cobalamine combinations] Other (See Comments) 10/06/2012   Vasotec [enalapril] Cough 10/06/2012   Verapamil Other (See Comments) 10/06/2012   Voltaren [diclofenac  sodium]  10/06/2012   Erythromycin Other (See Comments) 10/06/2012   Indocin [indomethacin]  10/06/2012   Jardiance [empagliflozin] Other (See  Comments) 02/26/2020    Review of Systems:    All systems reviewed and negative except where noted in HPI.   Physical Exam:  BP 119/73 (BP Location: Right Arm, Patient Position: Sitting, Cuff Size: Normal)   Pulse 86   Temp (!) 87.9 F (31.1 C)   Ht '5\' 4"'$  (1.626 m)   Wt 121 lb 3.2 oz (55 kg)   BMI 20.80 kg/m  No LMP recorded. Patient has had a hysterectomy.  General:   Alert,  Well-developed, well-nourished, pleasant and cooperative in NAD Head:  Normocephalic and atraumatic. Eyes:  Sclera clear, no icterus.   Conjunctiva pink. Ears:  Normal auditory acuity. Nose:  No deformity, discharge, or lesions. Mouth:  No deformity or lesions,oropharynx pink & moist. Neck:  Supple; no masses or thyromegaly. Lungs:  Respirations even and unlabored.  Clear throughout to auscultation.   No wheezes, crackles, or rhonchi. No acute distress. Heart:  Regular rate and rhythm; no murmurs, clicks, rubs, or gallops. Abdomen:  Normal bowel sounds.  Obese, Soft, non-tender and non-distended without masses, hepatosplenomegaly or hernias noted.  No guarding or rebound tenderness.   Rectal: Not done today Msk:  Symmetrical without gross deformities. Good, equal movement & strength bilaterally. Pulses:  Normal pulses noted. Extremities:  No clubbing or edema.  No cyanosis. Neurologic:  Alert and oriented x3;  grossly normal neurologically. Skin:  Intact without significant lesions or rashes. No jaundice. Psych:  Alert and cooperative. Normal mood and affect.  Imaging Studies: Reviewed  Assessment and Plan:   Ashley Gross is a 70 y.o. white female with h/o psoriatic arthritis, chronic diarrhea, inflammatory polyps in colon presents for f/u of rectal bleeding and chronic diarrhea. She underwent ligation of RA, RP. LL internal hemorrhoids. Had extensive  workup in the past which is negative for gastrinoma, IBD, microscopic colitis, celiac disease, chronic infections, EPI.  Rectal bleeding has resolved after resection of large inflammatory polyps.  Patient is no longer iron deficient  Inflammatory polyposis of the colon are inflammatory Polyposis is a rare phenomenon which can lead to rectal bleeding and chronic diarrhea.  This may or may not be associated with inflammatory bowel disease.  Her colon biopsies were negative for inflammatory bowel disease.  Few case reports have been mentioned in the literature.  In addition to removal of the large polyps, there might be a role for immunosuppressive therapy such as Biologics.     Patient is now here for follow-up of unexplained weight loss and to discuss about colonoscopy for history of inflammatory polyposis  Unexplained weight loss ?  Ozempic and grief reaction from loss of her husband or ?  Occult malignancy Recommend repeat colonoscopy Recommend CT abdomen and pelvis with contrast Recheck pancreatic fecal elastase levels due to presence of loose stools Encouraged high-protein diet, protein supplements Discuss with PCP about discontinuation of Ozempic given well-controlled diabetes  Follow up in 3 months   Cephas Darby, MD

## 2021-07-18 DIAGNOSIS — R634 Abnormal weight loss: Secondary | ICD-10-CM | POA: Diagnosis not present

## 2021-07-18 LAB — BUN+CREAT
BUN/Creatinine Ratio: 14 (ref 12–28)
BUN: 15 mg/dL (ref 8–27)
Creatinine, Ser: 1.04 mg/dL — ABNORMAL HIGH (ref 0.57–1.00)
eGFR: 58 mL/min/{1.73_m2} — ABNORMAL LOW (ref >59)

## 2021-07-18 NOTE — Telephone Encounter (Signed)
Patient notified and voiced understanding with repeating stop Ozempic.

## 2021-07-21 ENCOUNTER — Encounter: Payer: Self-pay | Admitting: Gastroenterology

## 2021-07-23 LAB — PANCREATIC ELASTASE, FECAL: Pancreatic Elastase, Fecal: 64 ug Elast./g — ABNORMAL LOW (ref >200)

## 2021-07-24 ENCOUNTER — Ambulatory Visit: Payer: PPO | Admitting: Anesthesiology

## 2021-07-24 ENCOUNTER — Ambulatory Visit
Admission: RE | Admit: 2021-07-24 | Discharge: 2021-07-24 | Disposition: A | Discharge status: Home / Self Care | Payer: PPO | Attending: Gastroenterology | Admitting: Gastroenterology

## 2021-07-24 ENCOUNTER — Telehealth: Payer: Self-pay | Admitting: Internal Medicine

## 2021-07-24 ENCOUNTER — Encounter: Payer: Self-pay | Admitting: Gastroenterology

## 2021-07-24 ENCOUNTER — Other Ambulatory Visit: Payer: Self-pay

## 2021-07-24 ENCOUNTER — Encounter
Admission: RE | Disposition: A | Discharge status: Home / Self Care | Payer: Self-pay | Source: Home / Self Care | Attending: Gastroenterology

## 2021-07-24 ENCOUNTER — Ambulatory Visit (INDEPENDENT_AMBULATORY_CARE_PROVIDER_SITE_OTHER): Payer: PPO | Admitting: Internal Medicine

## 2021-07-24 ENCOUNTER — Telehealth: Payer: Self-pay

## 2021-07-24 VITALS — BP 118/78 | HR 92 | Temp 99.6°F | Ht 64.0 in | Wt 121.8 lb

## 2021-07-24 DIAGNOSIS — Z888 Allergy status to other drugs, medicaments and biological substances status: Secondary | ICD-10-CM | POA: Insufficient documentation

## 2021-07-24 DIAGNOSIS — E7800 Pure hypercholesterolemia, unspecified: Secondary | ICD-10-CM

## 2021-07-24 DIAGNOSIS — E118 Type 2 diabetes mellitus with unspecified complications: Secondary | ICD-10-CM | POA: Diagnosis not present

## 2021-07-24 DIAGNOSIS — Z79899 Other long term (current) drug therapy: Secondary | ICD-10-CM | POA: Insufficient documentation

## 2021-07-24 DIAGNOSIS — Z7951 Long term (current) use of inhaled steroids: Secondary | ICD-10-CM | POA: Insufficient documentation

## 2021-07-24 DIAGNOSIS — F320 Major depressive disorder, single episode, mild: Secondary | ICD-10-CM

## 2021-07-24 DIAGNOSIS — J4520 Mild intermittent asthma, uncomplicated: Secondary | ICD-10-CM | POA: Diagnosis not present

## 2021-07-24 DIAGNOSIS — K219 Gastro-esophageal reflux disease without esophagitis: Secondary | ICD-10-CM

## 2021-07-24 DIAGNOSIS — K51419 Inflammatory polyps of colon with unspecified complications: Secondary | ICD-10-CM

## 2021-07-24 DIAGNOSIS — G479 Sleep disorder, unspecified: Secondary | ICD-10-CM | POA: Diagnosis not present

## 2021-07-24 DIAGNOSIS — R197 Diarrhea, unspecified: Secondary | ICD-10-CM | POA: Diagnosis not present

## 2021-07-24 DIAGNOSIS — L4050 Arthropathic psoriasis, unspecified: Secondary | ICD-10-CM

## 2021-07-24 DIAGNOSIS — I10 Essential (primary) hypertension: Secondary | ICD-10-CM

## 2021-07-24 DIAGNOSIS — Z9049 Acquired absence of other specified parts of digestive tract: Secondary | ICD-10-CM | POA: Insufficient documentation

## 2021-07-24 DIAGNOSIS — K529 Noninfective gastroenteritis and colitis, unspecified: Secondary | ICD-10-CM | POA: Diagnosis not present

## 2021-07-24 DIAGNOSIS — K5140 Inflammatory polyps of colon without complications: Secondary | ICD-10-CM | POA: Diagnosis not present

## 2021-07-24 DIAGNOSIS — Z860100 Personal history of colon polyps, unspecified: Secondary | ICD-10-CM

## 2021-07-24 DIAGNOSIS — F439 Reaction to severe stress, unspecified: Secondary | ICD-10-CM | POA: Diagnosis not present

## 2021-07-24 DIAGNOSIS — Z881 Allergy status to other antibiotic agents status: Secondary | ICD-10-CM | POA: Insufficient documentation

## 2021-07-24 DIAGNOSIS — Z794 Long term (current) use of insulin: Secondary | ICD-10-CM

## 2021-07-24 DIAGNOSIS — Z886 Allergy status to analgesic agent status: Secondary | ICD-10-CM | POA: Diagnosis not present

## 2021-07-24 DIAGNOSIS — F32A Depression, unspecified: Secondary | ICD-10-CM

## 2021-07-24 DIAGNOSIS — Z23 Encounter for immunization: Secondary | ICD-10-CM

## 2021-07-24 DIAGNOSIS — R634 Abnormal weight loss: Secondary | ICD-10-CM

## 2021-07-24 DIAGNOSIS — K635 Polyp of colon: Secondary | ICD-10-CM | POA: Diagnosis not present

## 2021-07-24 DIAGNOSIS — K580 Irritable bowel syndrome with diarrhea: Secondary | ICD-10-CM | POA: Diagnosis not present

## 2021-07-24 DIAGNOSIS — Z86018 Personal history of other benign neoplasm: Secondary | ICD-10-CM | POA: Diagnosis not present

## 2021-07-24 HISTORY — PX: COLONOSCOPY WITH PROPOFOL: SHX5780

## 2021-07-24 HISTORY — PX: POLYPECTOMY: SHX5525

## 2021-07-24 LAB — GLUCOSE, CAPILLARY
Glucose-Capillary: 70 mg/dL (ref 70–99)
Glucose-Capillary: 64 mg/dL — ABNORMAL LOW (ref 70–99)

## 2021-07-24 SURGERY — COLONOSCOPY WITH PROPOFOL
Anesthesia: General | Site: Rectum

## 2021-07-24 MED ORDER — ONDANSETRON HCL 4 MG/2ML IJ SOLN: 4.0000 mg | Freq: Once | INTRAMUSCULAR | Status: DC | PRN

## 2021-07-24 MED ORDER — SODIUM CHLORIDE 0.9 % IV SOLN: INTRAVENOUS | Status: DC

## 2021-07-24 MED ORDER — STERILE WATER FOR IRRIGATION IR SOLN
Status: DC | PRN
  Administered 2021-07-24: 250 mL

## 2021-07-24 MED ORDER — PROPOFOL 10 MG/ML IV BOLUS
INTRAVENOUS | Status: DC | PRN
  Administered 2021-07-24: 20 mg via INTRAVENOUS
  Administered 2021-07-24: 50 mg via INTRAVENOUS

## 2021-07-24 MED ORDER — PANCRELIPASE (LIP-PROT-AMYL) 36000-114000 UNITS PO CPEP: ORAL_CAPSULE | ORAL | 3 refills | Status: DC

## 2021-07-24 MED ORDER — PROPOFOL 500 MG/50ML IV EMUL
INTRAVENOUS | Status: DC | PRN
Start: 2021-07-24 — End: 2021-07-24
  Administered 2021-07-24: 100 ug/kg/min via INTRAVENOUS

## 2021-07-24 MED ORDER — LACTATED RINGERS IV SOLN: INTRAVENOUS | Status: DC

## 2021-07-24 MED ORDER — LIDOCAINE HCL (CARDIAC) PF 100 MG/5ML IV SOSY
PREFILLED_SYRINGE | INTRAVENOUS | Status: DC | PRN
  Administered 2021-07-24: 60 mg via INTRAVENOUS

## 2021-07-24 SURGICAL SUPPLY — 9 items
FORCEPS BIOP RAD 4 LRG CAP 4 (CUTTING FORCEPS) ×2 IMPLANT
GOWN CVR UNV OPN BCK APRN NK (MISCELLANEOUS) ×2 IMPLANT
GOWN ISOL THUMB LOOP REG UNIV (MISCELLANEOUS) ×4
KIT PRC NS LF DISP ENDO (KITS) ×1 IMPLANT
KIT PROCEDURE OLYMPUS (KITS) ×2
MANIFOLD NEPTUNE II (INSTRUMENTS) ×2 IMPLANT
SNARE COLD EXACTO (MISCELLANEOUS) ×2 IMPLANT
TRAP ETRAP POLY (MISCELLANEOUS) ×2 IMPLANT
WATER STERILE IRR 250ML POUR (IV SOLUTION) ×2 IMPLANT

## 2021-07-24 NOTE — Anesthesia Procedure Notes (Signed)
Date/Time: 07/24/2021 8:27 AM Performed by: Dionne Bucy, CRNA Pre-anesthesia Checklist: Patient identified, Emergency Drugs available, Suction available, Patient being monitored and Timeout performed Patient Re-evaluated:Patient Re-evaluated prior to induction Oxygen Delivery Method: Nasal cannula Induction Type: IV induction Placement Confirmation: positive ETCO2

## 2021-07-24 NOTE — H&P (Signed)
Cephas Darby, MD [address]  [place]  [place], [place] [address]  Main: [phone]  Fax: [phone] Pager: [phone]  Primary Care Physician:  Einar Pheasant, MD Primary Gastroenterologist:  Dr. Cephas Darby  Pre-Procedure History & Physical: HPI:  Ashley Gross is a 70 y.o. female is here for an colonoscopy.   Past Medical History:  Diagnosis Date   Abnormal liver function test 10/06/2012   Anemia, unspecified    Asthma    B12 deficiency 02/11/2019   Bleeding internal hemorrhoids 09/13/2017   Choledocholithiasis 06/29/2012   Formatting of this note might be different from the original. Dilated CBD on abdominal imaging 04/2012   Chronic diarrhea 02/16/2013   Colitis    Complication of anesthesia    asthma attack after shoulder surgery   Diabetes mellitus ([place])    Esophagitis    H/O hiatal hernia    Heart murmur    Hx of arteriovenous malformation (AVM)    Hypertension    IBS (irritable bowel syndrome)    Nephrolithiasis    Pernicious anemia    Personal history of colonic polyps 02/10/2013   02/08/13 colonoscopy - question of rectal varices, two 3-35mm polyps in the sigmoid colon, mass at the hepatic flexure, one 14mm polyp at the hepatic flexure, nodule at the ileocecal valve, congested mucusa in the entire colon, flattened villi mucosa in the terminal ileum.     Pneumonia    Psoriatic arthritis ([place])    s/p penicillamine, plaquenil, MTX, sulfasalazine.  s/p Embrel, Humira,.  Iritis.     Pure hypercholesterolemia    Rectal bleeding 07/15/2017    Past Surgical History:  Procedure Laterality Date   ABDOMINAL HYSTERECTOMY  1981   ovaries left in place   ANKLE SURGERY     right   BUNIONECTOMY     CHOLECYSTECTOMY  1989   COLONOSCOPY WITH PROPOFOL N/A 12/27/2017   Procedure: COLONOSCOPY WITH PROPOFOL;  Surgeon: Lin Landsman, MD;  Location: [facility] ENDOSCOPY;  Service: Gastroenterology;  Laterality: N/A;   COLONOSCOPY WITH PROPOFOL N/A 01/04/2019    Procedure: COLONOSCOPY WITH PROPOFOL;  Surgeon: Lin Landsman, MD;  Location: [place];  Service: Endoscopy;  Laterality: N/A;  Diabetic - oral and injectable   ESOPHAGOGASTRODUODENOSCOPY (EGD) WITH PROPOFOL N/A 12/27/2017   Procedure: ESOPHAGOGASTRODUODENOSCOPY (EGD) WITH PROPOFOL;  Surgeon: Lin Landsman, MD;  Location: [facility] ENDOSCOPY;  Service: Gastroenterology;  Laterality: N/A;   HAND SURGERY     right   HEEL SPUR SURGERY     NOSE SURGERY     x2   POLYPECTOMY  01/04/2019   Procedure: POLYPECTOMY;  Surgeon: Lin Landsman, MD;  Location: [place];  Service: Endoscopy;;   SHOULDER ARTHROSCOPY WITH OPEN ROTATOR CUFF REPAIR Left 09/20/2018   Procedure: SHOULDER ARTHROSCOPY WITH OPEN ROTATOR CUFF REPAIR;  Surgeon: Corky Mull, MD;  Location: [facility] ORS;  Service: Orthopedics;  Laterality: Left;   SHOULDER CLOSED REDUCTION Left 12/21/2018   Procedure: MANIPULATION UNDER ANESTHESIA WITH STEROID INJECTION;  Surgeon: Corky Mull, MD;  Location: [place];  Service: Orthopedics;  Laterality: Left;  Diabetic - insulin and oral meds   UMBILICAL HERNIA REPAIR      Prior to Admission medications   Medication Sig Start Date End Date Taking? Authorizing Provider  acetaminophen (TYLENOL) 325 MG tablet Take 650 mg by mouth every 6 (six) hours as needed.   Yes [provider]  albuterol (PROVENTIL) (2.5 MG/3ML) 0.083% nebulizer solution Take 3 mLs (2.5  mg total) by nebulization every 4 (four) hours as needed for wheezing or shortness of breath. 04/02/21  Yes McLean-Scocuzza, Nino Glow, MD  amLODipine (NORVASC) 5 MG tablet TAKE 1 TABLET(5 MG) BY MOUTH TWICE DAILY 02/27/21  Yes Einar Pheasant, MD  budesonide-formoterol [facility]) 160-4.5 MCG/ACT inhaler Inhale 2 puffs into the lungs 2 (two) times daily. 11/13/20  Yes Einar Pheasant, MD  Calcium Carbonate-Vit D-Min (CALCIUM 600+D PLUS MINERALS) 600-400 MG-UNIT TABS Take by mouth.   Yes [provider]  Cholecalciferol (VITAMIN D-3) 1000 UNITS CAPS Take 2 capsules by mouth daily.   Yes [provider]  cyclobenzaprine (FLEXERIL) 10 MG tablet TAKE 1 TABLET BY MOUTH EVERY NIGHT AS NEEDED 06/27/21  Yes Einar Pheasant, MD  losartan (COZAAR) 100 MG tablet TAKE 1 TABLET BY MOUTH EVERY DAY 02/27/21  Yes Einar Pheasant, MD  mirtazapine (REMERON) 15 MG tablet TAKE 1 TO 2 TABLETS BY MOUTH AT BEDTIME 07/14/21  Yes Einar Pheasant, MD  omeprazole (PRILOSEC) 20 MG capsule TAKE 1 CAPSULE(20 MG) BY MOUTH TWICE DAILY 06/27/21  Yes Einar Pheasant, MD  rosuvastatin (CRESTOR) 10 MG tablet TAKE 1 TABLET(10 MG) BY MOUTH DAILY 02/27/21  Yes Einar Pheasant, MD  sodium chloride (OCEAN) 0.65 % SOLN nasal spray Place 2 sprays into both nostrils as needed for congestion. 04/02/21  Yes McLean-Scocuzza, Nino Glow, MD  erythromycin ophthalmic ointment Place 1 application into the left eye 4 (four) times daily. X 4-7 days 04/02/21   McLean-Scocuzza, Nino Glow, MD  HYDROcodone bit-homatropine (HYCODAN) 5-1.5 MG/5ML syrup Take 5 mLs by mouth at bedtime as needed for cough. Patient not taking: Reported on 07/21/2021 04/02/21   McLean-Scocuzza, Nino Glow, MD  insulin degludec (TRESIBA FLEXTOUCH) 100 UNIT/ML FlexTouch Pen Inject 18 Units into the skin daily. 11/27/20   Einar Pheasant, MD  ONE TOUCH ULTRA TEST test strip PATIENT NEEDS NEW METER STRIPS AND LANCETS FOR ONE TOUCH. HER INSURANCE NO LONGER COVERS ACCUCHEK. 07/31/15   Einar Pheasant, MD  predniSONE (DELTASONE) 20 MG tablet 40 mg x 4 days, 20 mg x 3 days, 10 mg x 2 days with breakfast in the am Patient not taking: No sig reported 04/02/21   McLean-Scocuzza, Nino Glow, MD  Semaglutide,0.25 or 0.5MG /DOS, (OZEMPIC, 0.25 OR 0.5 MG/DOSE,) 2 MG/1.5ML SOPN Inject 0.25 mg weekly Patient not taking: Reported on 07/21/2021 06/19/21   Einar Pheasant, MD    Allergies as of 07/17/2021 - Review Complete 07/17/2021  Allergen Reaction Noted   Aspirin Other (See Comments) 10/06/2012   Beta  adrenergic blockers  10/06/2012   Lisinopril  12/21/2018   Mtx support [cobalamine combinations] Other (See Comments) 10/06/2012   Vasotec [enalapril] Cough 10/06/2012   Verapamil Other (See Comments) 10/06/2012   Voltaren [diclofenac sodium]  10/06/2012   Erythromycin Other (See Comments) 10/06/2012   Indocin [indomethacin]  10/06/2012   Jardiance [empagliflozin] Other (See Comments) 02/26/2020    Family History  Problem Relation Age of Onset   Diabetes Other    Hypertension Other    Breast cancer Neg Hx    Colon cancer Neg Hx     Social History   Socioeconomic History   Marital status: Married    Spouse name: Not on file   Number of children: 2   Years of education: Not on file   Highest education level: Not on file  Occupational History   Not on file  Tobacco Use   Smoking status: Never   Smokeless tobacco: Never  Vaping Use   Vaping Use: Never used  Substance and Sexual Activity   Alcohol use: No    Alcohol/week: 0.0 standard drinks   Drug use: No   Sexual activity: Not Currently  Other Topics Concern   Not on file  Social History Narrative   She is married and has two children   Social Determinants of Health   Financial Resource Strain: Medium Risk   Difficulty of Paying Living Expenses: Somewhat hard  Food Insecurity: No Food Insecurity   Worried About Charity fundraiser in the Last Year: Never true   Ran Out of Food in the Last Year: Never true  Transportation Needs: No Transportation Needs   Lack of Transportation (Medical): No   Lack of Transportation (Non-Medical): No  Physical Activity: Not on file  Stress: No Stress Concern Present   Feeling of Stress : Only a little  Social Connections: Unknown   Frequency of Communication with Friends and Family: More than three times a week   Frequency of Social Gatherings with Friends and Family: Not on file   Attends Religious Services: Not on Electrical engineer or Organizations: Not on file    Attends Archivist Meetings: Not on file   Marital Status: Not on file  Intimate Partner Violence: Not At Risk   Fear of Current or Ex-Partner: No   Emotionally Abused: No   Physically Abused: No   Sexually Abused: No    Review of Systems: See HPI, otherwise negative ROS  Physical Exam: BP 134/67   Pulse 80   Temp (!) 97.5 F (36.4 C)   Ht 5\' 4"  (1.626 m)   Wt 54.7 kg   SpO2 98%   BMI 20.68 kg/m  General:   Alert,  pleasant and cooperative in NAD Head:  Normocephalic and atraumatic. Neck:  Supple; no masses or thyromegaly. Lungs:  Clear throughout to auscultation.    Heart:  Regular rate and rhythm. Abdomen:  Soft, nontender and nondistended. Normal bowel sounds, without guarding, and without rebound.   Neurologic:  Alert and  oriented x4;  grossly normal neurologically.  Impression/Plan: Ashley Gross is here for an colonoscopy to be performed for h/o colon polyps, weight loss, diarrhea  Risks, benefits, limitations, and alternatives regarding  colonoscopy have been reviewed with the patient.  Questions have been answered.  All parties agreeable.   Sherri Sear, MD  07/24/2021, 7:38 AM

## 2021-07-24 NOTE — Progress Notes (Signed)
Patient ID: Ashley Gross, female   DOB: 08/25/1951, 70 y.o.   MRN: [id number]   Subjective:    Patient ID: Ashley Gross, female    DOB: 08/28/1951, 70 y.o.   MRN: [id number]  This visit occurred during the SARS-CoV-2 public health emergency.  Safety protocols were in place, including screening questions prior to the visit, additional usage of staff PPE, and extensive cleaning of exam room while observing appropriate contact time as indicated for disinfecting solutions.   Patient here for scheduled follow up.   Chief Complaint  Patient presents with   Follow-up    7 week    .   HPI Here to follow up regarding her diabetes, blood pressure and cholesterol.  No chest pain.  Breathing stable.  No increased cough or congestion.  Eating. Increased stress. Having difficulty sleeping.  Wakes up in the middle of the night and stays awake.  Trouble falling back asleep.     Past Medical History:  Diagnosis Date   Abnormal liver function test 10/06/2012   Anemia, unspecified    Asthma    B12 deficiency 02/11/2019   Bleeding internal hemorrhoids 09/13/2017   Choledocholithiasis 06/29/2012   Formatting of this note might be different from the original. Dilated CBD on abdominal imaging 04/2012   Chronic diarrhea 02/16/2013   Colitis    Complication of anesthesia    asthma attack after shoulder surgery   Diabetes mellitus ([place])    Esophagitis    H/O hiatal hernia    Heart murmur    Hx of arteriovenous malformation (AVM)    Hypertension    IBS (irritable bowel syndrome)    Nephrolithiasis    Pernicious anemia    Personal history of colonic polyps 02/10/2013   02/08/13 colonoscopy - question of rectal varices, two 3-38m polyps in the sigmoid colon, mass at the hepatic flexure, one 564mpolyp at the hepatic flexure, nodule at the ileocecal valve, congested mucusa in the entire colon, flattened villi mucosa in the terminal ileum.     Pneumonia    [place]atic [place] ([place]   s/p penicillamine,  plaquenil, MTX, sulfasalazine.  s/p Embrel, Humira,.  Iritis.     Pure hypercholesterolemia    Rectal bleeding 07/15/2017   Past Surgical History:  Procedure Laterality Date   ABDOMINAL HYSTERECTOMY  1981   ovaries left in place   ANKLE SURGERY     right   BUNIONECTOMY     CHOLECYSTECTOMY  1989   COLONOSCOPY WITH PROPOFOL N/A 12/27/2017   Procedure: COLONOSCOPY WITH PROPOFOL;  Surgeon: VaLin LandsmanMD;  [facility];  Service: Gastroenterology;  Laterality: N/A;   COLONOSCOPY WITH PROPOFOL N/A 01/04/2019   Procedure: COLONOSCOPY WITH PROPOFOL;  Surgeon: VaLin LandsmanMD;  [place]: [place];  Laterality: N/A;  Diabetic - oral and injectable   COLONOSCOPY WITH PROPOFOL N/A 07/24/2021   Procedure: COLONOSCOPY WITH PROPOFOL;  Surgeon: VaLin LandsmanMD;  [place]: [place];  Laterality: N/A;  Diabetic   ESOPHAGOGASTRODUODENOSCOPY (EGD) WITH PROPOFOL N/A 12/27/2017   Procedure: ESOPHAGOGASTRODUODENOSCOPY (EGD) WITH PROPOFOL;  Surgeon: VaLin LandsmanMD;  [facility];  Service: Gastroenterology;  Laterality: N/A;   HAND SURGERY     right   HEEL SPUR SURGERY     NOSE SURGERY     x2   POLYPECTOMY  01/04/2019   Procedure: POLYPECTOMY;  Surgeon: VaLin LandsmanMD;  [place]: [place]oscopy;;  POLYPECTOMY N/A 07/24/2021   Procedure: POLYPECTOMY;  Surgeon: Lin Landsman, MD;  Location: [place];  Service: Endoscopy;  Laterality: N/A;   SHOULDER ARTHROSCOPY WITH OPEN ROTATOR CUFF REPAIR Left 09/20/2018   Procedure: SHOULDER ARTHROSCOPY WITH OPEN ROTATOR CUFF REPAIR;  Surgeon: Corky Mull, MD;  Location: [facility];  Service: Orthopedics;  Laterality: Left;   SHOULDER CLOSED REDUCTION Left 12/21/2018   Procedure: MANIPULATION UNDER ANESTHESIA WITH STEROID INJECTION;  Surgeon: Corky Mull, MD;  Location: [place];  Service:  Orthopedics;  Laterality: Left;  Diabetic - insulin and oral meds   UMBILICAL HERNIA REPAIR     Family History  Problem Relation Age of Onset   Diabetes Other    Hypertension Other    Breast cancer Neg Hx    Colon cancer Neg Hx    Social History   Socioeconomic History   Marital status: Married    Spouse name: Not on file   Number of children: 2   Years of education: Not on file   Highest education level: Not on file  Occupational History   Not on file  Tobacco Use   Smoking status: Never   Smokeless tobacco: Never  Vaping Use   Vaping Use: Never used  Substance and Sexual Activity   Alcohol use: No    Alcohol/week: 0.0 standard drinks   Drug use: No   Sexual activity: Not Currently  Other Topics Concern   Not on file  Social History Narrative   She is married and has two children   Social Determinants of Health   Financial Resource Strain: Medium Risk   Difficulty of Paying Living Expenses: Somewhat hard  Food Insecurity: No Food Insecurity   Worried About Charity fundraiser in the Last Year: Never true   Ran Out of Food in the Last Year: Never true  Transportation Needs: No Transportation Needs   Lack of Transportation (Medical): No   Lack of Transportation (Non-Medical): No  Physical Activity: Not on file  Stress: No Stress Concern Present   Feeling of Stress : Only a little  Social Connections: Unknown   Frequency of Communication with Friends and Family: More than three times a week   Frequency of Social Gatherings with Friends and Family: Not on file   Attends Religious Services: Not on file   Active Member of Clubs or Organizations: Not on file   Attends Archivist Meetings: Not on file   Marital Status: Not on file     Review of Systems  Constitutional:        Weight loss as outlined.  Is eating.    HENT:  Negative for congestion and sinus pressure.   Respiratory:  Negative for cough, chest tightness and shortness of breath.    Cardiovascular:  Negative for chest pain, palpitations and leg swelling.  Gastrointestinal:  Negative for abdominal pain, diarrhea, nausea and vomiting.  Genitourinary:  Negative for difficulty urinating and dysuria.  Musculoskeletal:  Negative for joint swelling and myalgias.  Skin:  Negative for color change and rash.  Neurological:  Negative for dizziness, light-headedness and headaches.  Psychiatric/Behavioral:  Negative for agitation and dysphoric mood.        Increased stress as outlined.        Objective:     BP 118/78   Pulse 92   Temp 99.6 F (37.6 C) (Oral)   Ht '5\' 4"'  (1.626 m)   Wt 121 lb 12.8 oz (55.2 kg)  SpO2 98%   BMI 20.91 kg/m  Wt Readings from Last 3 Encounters:  07/24/21 121 lb 12.8 oz (55.2 kg)  07/24/21 120 lb 8 oz (54.7 kg)  07/17/21 121 lb 3.2 oz (55 kg)    Physical Exam Vitals reviewed.  Constitutional:      General: She is not in acute distress.    Appearance: Normal appearance.  HENT:     Head: Normocephalic and atraumatic.     Right Ear: External ear normal.     Left Ear: External ear normal.  Eyes:     General: No scleral icterus.       Right eye: No discharge.        Left eye: No discharge.     Conjunctiva/sclera: Conjunctivae normal.  Neck:     Thyroid: No thyromegaly.  Cardiovascular:     Rate and Rhythm: Normal rate and regular rhythm.  Pulmonary:     Effort: No respiratory distress.     Breath sounds: Normal breath sounds. No wheezing.  Abdominal:     General: Bowel sounds are normal.     Palpations: Abdomen is soft.     Tenderness: There is no abdominal tenderness.  Musculoskeletal:        General: No swelling or tenderness.     Cervical back: Neck supple. No tenderness.  Lymphadenopathy:     Cervical: No cervical adenopathy.  Skin:    Findings: No erythema or rash.  Neurological:     Mental Status: She is alert.  Psychiatric:        Mood and Affect: Mood normal.        Behavior: Behavior normal.     Outpatient  Encounter Medications as of 07/24/2021  Medication Sig   acetaminophen (TYLENOL) 325 MG tablet Take 650 mg by mouth every 6 (six) hours as needed.   albuterol (PROVENTIL) (2.5 MG/3ML) 0.083% nebulizer solution Take 3 mLs (2.5 mg total) by nebulization every 4 (four) hours as needed for wheezing or shortness of breath.   amLODipine (NORVASC) 5 MG tablet TAKE 1 TABLET(5 MG) BY MOUTH TWICE DAILY   budesonide-formoterol (SYMBICORT) 160-4.5 MCG/ACT inhaler Inhale 2 puffs into the lungs 2 (two) times daily.   Calcium Carbonate-Vit D-Min (CALCIUM 600+D PLUS MINERALS) 600-400 MG-UNIT TABS Take by mouth.   Cholecalciferol (VITAMIN D-3) 1000 UNITS CAPS Take 2 capsules by mouth daily.   cyclobenzaprine (FLEXERIL) 10 MG tablet TAKE 1 TABLET BY MOUTH EVERY NIGHT AS NEEDED   erythromycin ophthalmic ointment Place 1 application into the left eye 4 (four) times daily. X 4-7 days   insulin degludec (TRESIBA FLEXTOUCH) 100 UNIT/ML FlexTouch Pen Inject 18 Units into the skin daily.   losartan (COZAAR) 100 MG tablet TAKE 1 TABLET BY MOUTH EVERY DAY   mirtazapine (REMERON) 15 MG tablet TAKE 1 TO 2 TABLETS BY MOUTH AT BEDTIME   omeprazole (PRILOSEC) 20 MG capsule TAKE 1 CAPSULE(20 MG) BY MOUTH TWICE DAILY   ONE TOUCH ULTRA TEST test strip PATIENT NEEDS NEW METER STRIPS AND LANCETS FOR ONE TOUCH. HER INSURANCE NO LONGER COVERS ACCUCHEK.   rosuvastatin (CRESTOR) 10 MG tablet TAKE 1 TABLET(10 MG) BY MOUTH DAILY   sodium chloride (OCEAN) 0.65 % SOLN nasal spray Place 2 sprays into both nostrils as needed for congestion.   [DISCONTINUED] 0.9 %  sodium chloride infusion    [DISCONTINUED] lactated ringers infusion    [DISCONTINUED] ondansetron (ZOFRAN) injection 4 mg    No facility-administered encounter medications on file as of 07/24/2021.  Lab Results  Component Value Date   WBC 11.1 (H) 04/09/2021   HGB 11.6 (L) 04/09/2021   HCT 35.5 (L) 04/09/2021   PLT 397.0 04/09/2021   GLUCOSE 52 (L) 03/21/2021   CHOL 114  03/21/2021   TRIG 79.0 03/21/2021   HDL 44.30 03/21/2021   LDLDIRECT 74.0 04/19/2019   LDLCALC 54 03/21/2021   ALT 12 03/21/2021   AST 18 03/21/2021   NA 142 03/21/2021   K 3.8 04/22/2021   CL 104 03/21/2021   CREATININE 1.04 (H) 07/17/2021   BUN 15 07/17/2021   CO2 30 03/21/2021   TSH 1.83 11/13/2020   HGBA1C 5.4 03/21/2021   MICROALBUR 2.0 (H) 11/13/2020       Assessment & Plan:   Problem List Items Addressed This Visit     Asthma    Breathing stable.       Controlled type 2 diabetes mellitus with complication, with long-term current use of insulin (HCC)    Sugars around 70 yesterday and today (am sugar).  She has lost weight.  Weight stable from last check.  Off ozempic. Just stopped.  Follow met b and a1c.        Relevant Orders   Hemoglobin A1c   Hepatic function panel   Lipid panel   GERD (gastroesophageal reflux disease)    No upper symptoms.  On omeprazole.        Hypercholesterolemia    Continue crestor. Low cholesterol diet and exercise.  Follow lipid panel and liver function tests.        Hypertension    Blood pressure as outlined.  On amlodipine and losartan.  Consider decrease amlodipine to 2.17m q day.  Follow pressures.  If remains low, may be able to stop amlodipine.  Follow pressures.  Follow metabolic panel.       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  Continue f/u with GI.        Loss of weight    Off ozempic.  Weight stable on recent check.  Just diagnosed with pancreatic insufficiency.  Started on creon.  Follow.  Just had colonoscopy.  Planning for CT.        Mild depression ([place]    Continuing to try to cope with her husband's passing.  On remeron now.  Follow.        Psoriatic arthritis ([place]    Stable. Followed by Dr KJefm Bryant       Sleep difficulties    Worsening problems with sleep.  Taking remeron.  Has tried trazodone.   Discuss with psychiatry regarding further treatment options.       Stress    Has been on remeron.  Was helping at first.  Has good family support.  Still with problems sleeping.  Has tried remeron and trazodone.  D/w psych regarding treatment options.        Other Visit Diagnoses     Need for immunization against influenza    -  Primary   Relevant Orders   Flu Vaccine QUAD High Dose(Fluad) (Completed)        CEinar Pheasant MD

## 2021-07-24 NOTE — Telephone Encounter (Signed)
-----   Message from Lin Landsman, MD sent at 07/24/2021  3:50 PM EDT ----- Caryl Pina  Please inform patient that she has severe pancreatic insufficiency.  Recommend to start Creon or Zenpep 2 capsules with first bite of each meal and 1 with snack.  This certainly could explain her weight loss along with Ozempic.  I told her son-in-law today that they could pick up samples from [facility] office  I will see her for follow-up as scheduled  Rohini Vanga

## 2021-07-24 NOTE — Telephone Encounter (Signed)
Patient scheduled for labs 10/2021 as requested by check out note.   Needing orders placed.

## 2021-07-24 NOTE — Telephone Encounter (Signed)
Patient verbalized understanding of results sent creon to the pharmacy

## 2021-07-24 NOTE — Anesthesia Preprocedure Evaluation (Signed)
Anesthesia Evaluation  Patient identified by MRN, date of birth, ID band Patient awake    Reviewed: Allergy & Precautions, NPO status   Airway Mallampati: II  TM Distance: >3 FB     Dental   Pulmonary asthma ,    Pulmonary exam normal        Cardiovascular hypertension,  Rhythm:Regular Rate:Normal  HLD   Neuro/Psych PSYCHIATRIC DISORDERS Depression    GI/Hepatic hiatal hernia, GERD  ,IBS   Endo/Other  diabetes, Type 2  Renal/GU      Musculoskeletal  (+) Arthritis  (psoriatic),   Abdominal   Peds  Hematology   Anesthesia Other Findings   Reproductive/Obstetrics                            Anesthesia Physical Anesthesia Plan  ASA: 3  Anesthesia Plan: General   Post-op Pain Management:    Induction: Intravenous  PONV Risk Score and Plan: Propofol infusion, TIVA and Treatment may vary due to age or medical condition  Airway Management Planned: Natural Airway and Nasal Cannula  Additional Equipment:   Intra-op Plan:   Post-operative Plan:   Informed Consent: I have reviewed the patients History and Physical, chart, labs and discussed the procedure including the risks, benefits and alternatives for the proposed anesthesia with the patient or authorized representative who has indicated his/her understanding and acceptance.       Plan Discussed with: CRNA  Anesthesia Plan Comments:         Anesthesia Quick Evaluation

## 2021-07-24 NOTE — Anesthesia Postprocedure Evaluation (Addendum)
Anesthesia Post Note  Patient: Ashley Gross  Procedure(s) Performed: COLONOSCOPY WITH PROPOFOL (Rectum) POLYPECTOMY (Rectum)     Patient location during evaluation: PACU Anesthesia Type: General Level of consciousness: awake Pain management: pain level controlled Vital Signs Assessment: post-procedure vital signs reviewed and stable Respiratory status: respiratory function stable Cardiovascular status: stable Postop Assessment: no signs of nausea or vomiting Anesthetic complications: no   No notable events documented.  Veda Canning

## 2021-07-24 NOTE — Op Note (Signed)
[facility] Gastroenterology Patient Name: Ashley Gross Procedure Date: 07/24/2021 8:20 AM MRN: [id number] Account #: [id number] Date of Birth: 21-Jan-1951 Admit Type: Outpatient Age: 70 Room: [facility] OR ROOM 01 Gender: Female Note Status: Finalized Instrument Name: [id number] Procedure:             Colonoscopy Indications:           Last colonoscopy: March 2020, Clinically significant                         diarrhea of unexplained origin, Follow-up for history                         of inflammatory polyps in the rectum Providers:             Lin Landsman MD, MD Referring MD:          Einar Pheasant, MD (Referring MD) Medicines:             General Anesthesia Complications:         No immediate complications. Estimated blood loss:                         Minimal. Procedure:             Pre-Anesthesia Assessment:                        - Prior to the procedure, a History and Physical was                         performed, and patient medications and allergies were                         reviewed. The patient is competent. The risks and                         benefits of the procedure and the sedation options and                         risks were discussed with the patient. All questions                         were answered and informed consent was obtained.                         Patient identification and proposed procedure were                         verified by the physician, the nurse, the                         anesthesiologist, the anesthetist and the technician                         in the pre-procedure area in the procedure room in the                         endoscopy suite. Mental Status Examination: alert and  oriented. Airway Examination: normal oropharyngeal                         airway and neck mobility. Respiratory Examination:                         clear to auscultation. CV Examination: normal.                          Prophylactic Antibiotics: The patient does not require                         prophylactic antibiotics. Prior Anticoagulants: The                         patient has taken no previous anticoagulant or                         antiplatelet agents. ASA Grade Assessment: III - A                         patient with severe systemic disease. After reviewing                         the risks and benefits, the patient was deemed in                         satisfactory condition to undergo the procedure. The                         anesthesia plan was to use general anesthesia.                         Immediately prior to administration of medications,                         the patient was re-assessed for adequacy to receive                         sedatives. The heart rate, respiratory rate, oxygen                         saturations, blood pressure, adequacy of pulmonary                         ventilation, and response to care were monitored                         throughout the procedure. The physical status of the                         patient was re-assessed after the procedure.                        After obtaining informed consent, the colonoscope was                         passed under direct vision. Throughout the procedure,  the patient's blood pressure, pulse, and oxygen                         saturations were monitored continuously. The                         Colonoscope was introduced through the anus and                         advanced to the 10 cm into the ileum. The colonoscopy                         was performed without difficulty. The patient                         tolerated the procedure well. The quality of the bowel                         preparation was evaluated using the BBPS [facility] Bowel                         Preparation Scale) with scores of: Right Colon = 3,                         Transverse Colon = 3 and Left Colon = 3  (entire mucosa                         seen well with no residual staining, small fragments                         of stool or opaque liquid). The total BBPS score                         equals 9. Findings:      The perianal and digital rectal examinations were normal. Pertinent       negatives include normal sphincter tone and no palpable rectal lesions.      The terminal ileum appeared normal.      Three sessile polyps were found in the transverse colon, ascending colon       and cecum. The polyps were 5 to 7 mm in size. These polyps were removed       with a cold snare. Resection and retrieval were complete. Estimated       blood loss was minimal.      Normal mucosa was found in the entire colon. Biopsies were taken with a       cold forceps for histology.      Multiple multi-lobulated, pedunculated and sessile and semi-pedunculated       polyps of varying sizes from small to large were found in the       recto-sigmoid colon, in the sigmoid colon and in the ascending colon,       worse in left colon, with known h/o inflammtory polyposis. No maneuver       was performed Impression:            - The examined portion of the ileum was normal.                        -  Three 5 to 7 mm polyps in the transverse colon, in                         the ascending colon and in the cecum, removed with a                         cold snare. Resected and retrieved.                        - Normal mucosa in the entire examined colon. Biopsied.                        - Polyposis at the recto-sigmoid colon, in the sigmoid                         colon and in the ascending colon. Recommendation:        - Discharge patient to home (with escort).                        - Resume previous diet today.                        - Continue present medications.                        - Await pathology results.                        - Return to my office as previously scheduled. Procedure Code(s):     ---  Professional ---                        [phone], Colonoscopy, flexible; with removal of                         tumor(s), polyp(s), or other lesion(s) by snare                         technique                        45380, 75, Colonoscopy, flexible; with biopsy, single                         or multiple Diagnosis Code(s):     --- Professional ---                        K63.5, Polyp of colon                        R19.7, Diarrhea, unspecified                        Z86.018, Personal history of other benign neoplasm CPT copyright 2019 American Medical Association. All rights reserved. The codes documented in this report are preliminary and upon coder review may  be revised to meet current compliance requirements. Dr. Ulyess Mort Lin Landsman MD, MD 07/24/2021 9:04:00 AM This report has been signed electronically. Number of Addenda: 0 Note Initiated On: 07/24/2021 8:20 AM Scope Withdrawal Time: 0  hours 15 minutes 42 seconds  Total Procedure Duration: 0 hours 23 minutes 4 seconds  Estimated Blood Loss:  Estimated blood loss was minimal. Estimated blood loss                         was minimal.      [facility]

## 2021-07-24 NOTE — Transfer of Care (Signed)
Immediate Anesthesia Transfer of Care Note  Patient: Ashley Gross  Procedure(s) Performed: COLONOSCOPY WITH PROPOFOL (Rectum) POLYPECTOMY (Rectum)  Patient Location: PACU  Anesthesia Type: General  Level of Consciousness: awake, alert  and patient cooperative  Airway and Oxygen Therapy: Patient Spontanous Breathing and Patient connected to supplemental oxygen  Post-op Assessment: Post-op Vital signs reviewed, Patient's Cardiovascular Status Stable, Respiratory Function Stable, Patent Airway and No signs of Nausea or vomiting  Post-op Vital Signs: Reviewed and stable  Complications: No notable events documented.

## 2021-07-25 ENCOUNTER — Encounter: Payer: Self-pay | Admitting: Gastroenterology

## 2021-07-27 ENCOUNTER — Other Ambulatory Visit: Payer: Self-pay | Admitting: Internal Medicine

## 2021-07-27 ENCOUNTER — Encounter: Payer: Self-pay | Admitting: Internal Medicine

## 2021-07-27 NOTE — Assessment & Plan Note (Signed)
Just had colonoscopy.  Continue f/u with GI.

## 2021-07-27 NOTE — Assessment & Plan Note (Signed)
Stable.  Followed by Dr Kernodle.   

## 2021-07-27 NOTE — Assessment & Plan Note (Addendum)
Off ozempic.  Weight stable on recent check.  Just diagnosed with pancreatic insufficiency.  Started on creon.  Follow.  Just had colonoscopy.  Planning for CT.

## 2021-07-27 NOTE — Assessment & Plan Note (Signed)
Worsening problems with sleep.  Taking remeron.  Has tried trazodone.  Discuss with psychiatry regarding further treatment options.

## 2021-07-27 NOTE — Assessment & Plan Note (Signed)
No upper symptoms.  On omeprazole.  

## 2021-07-27 NOTE — Assessment & Plan Note (Signed)
Breathing stable.

## 2021-07-27 NOTE — Assessment & Plan Note (Signed)
Continuing to try to cope with her husband's passing.  On remeron now.  Follow.

## 2021-07-27 NOTE — Assessment & Plan Note (Signed)
Blood pressure as outlined.  On amlodipine and losartan.  Consider decrease amlodipine to 2.5mg  q day.  Follow pressures.  If remains low, may be able to stop amlodipine.  Follow pressures.  Follow metabolic panel.

## 2021-07-27 NOTE — Assessment & Plan Note (Signed)
Continue crestor.  Low cholesterol diet and exercise. Follow lipid panel and liver function tests.   

## 2021-07-27 NOTE — Assessment & Plan Note (Signed)
Sugars around 70 yesterday and today (am sugar).  She has lost weight.  Weight stable from last check.  Off ozempic. Just stopped.  Follow met b and a1c.

## 2021-07-27 NOTE — Assessment & Plan Note (Signed)
Has been on remeron.  Was helping at first.  Has good family support.  Still with problems sleeping.  Has tried remeron and trazodone.  D/w psych regarding treatment options.

## 2021-07-28 LAB — SURGICAL PATHOLOGY

## 2021-07-28 NOTE — Telephone Encounter (Signed)
Labs ordered.

## 2021-08-04 ENCOUNTER — Telehealth: Payer: Self-pay | Admitting: Internal Medicine

## 2021-08-04 ENCOUNTER — Telehealth: Payer: Self-pay | Admitting: Gastroenterology

## 2021-08-04 MED ORDER — ZENPEP 40000-126000 UNITS PO CPEP: ORAL_CAPSULE | ORAL | 2 refills | Status: DC

## 2021-08-04 NOTE — Telephone Encounter (Signed)
Patient left a detail message informing patient of the change

## 2021-08-04 NOTE — Telephone Encounter (Signed)
Pt. Is unable to afford the copay for Creon she is requesting a call back to see what her options are.

## 2021-08-04 NOTE — Telephone Encounter (Signed)
Sent Zenpep to the pharmacy it looks like this will be cheaper.

## 2021-08-04 NOTE — Telephone Encounter (Signed)
Patient states she was informed that Dr Nicki Reaper was going to talk to someone about her starting a new sleep medication. States she has not heard back and her lack of sleep is increasing.   Please advise

## 2021-08-05 DIAGNOSIS — H9393 Unspecified disorder of ear, bilateral: Secondary | ICD-10-CM | POA: Diagnosis not present

## 2021-08-05 DIAGNOSIS — H903 Sensorineural hearing loss, bilateral: Secondary | ICD-10-CM | POA: Diagnosis not present

## 2021-08-06 ENCOUNTER — Other Ambulatory Visit: Payer: Self-pay

## 2021-08-06 ENCOUNTER — Other Ambulatory Visit: Payer: Self-pay | Admitting: Otolaryngology

## 2021-08-06 ENCOUNTER — Ambulatory Visit
Admission: RE | Admit: 2021-08-06 | Discharge: 2021-08-06 | Disposition: A | Discharge status: Home / Self Care | Payer: PPO | Source: Ambulatory Visit | Attending: Gastroenterology | Admitting: Gastroenterology

## 2021-08-06 DIAGNOSIS — R42 Dizziness and giddiness: Secondary | ICD-10-CM

## 2021-08-06 DIAGNOSIS — H903 Sensorineural hearing loss, bilateral: Secondary | ICD-10-CM

## 2021-08-06 DIAGNOSIS — K529 Noninfective gastroenteritis and colitis, unspecified: Secondary | ICD-10-CM | POA: Diagnosis not present

## 2021-08-06 DIAGNOSIS — R634 Abnormal weight loss: Secondary | ICD-10-CM | POA: Diagnosis not present

## 2021-08-06 MED ORDER — IOHEXOL 350 MG/ML SOLN
75.0000 mL | Freq: Once | INTRAVENOUS | Status: AC | PRN
  Administered 2021-08-06: 75 mL via INTRAVENOUS

## 2021-08-06 NOTE — Telephone Encounter (Signed)
Notify Ashley Gross that I did speak with psychiatry.  Given that she is on remeron , she can add melatonin 3-5mg  to be taken with the remeron.  Trial of remeron and melatonin combined.  If persistent problems, let me know.

## 2021-08-07 NOTE — Telephone Encounter (Signed)
LMTCB

## 2021-08-08 ENCOUNTER — Ambulatory Visit (INDEPENDENT_AMBULATORY_CARE_PROVIDER_SITE_OTHER): Payer: PPO | Admitting: Pharmacist

## 2021-08-08 DIAGNOSIS — E1121 Type 2 diabetes mellitus with diabetic nephropathy: Secondary | ICD-10-CM

## 2021-08-08 DIAGNOSIS — E7800 Pure hypercholesterolemia, unspecified: Secondary | ICD-10-CM

## 2021-08-08 DIAGNOSIS — E118 Type 2 diabetes mellitus with unspecified complications: Secondary | ICD-10-CM

## 2021-08-08 DIAGNOSIS — I10 Essential (primary) hypertension: Secondary | ICD-10-CM

## 2021-08-08 DIAGNOSIS — Z794 Long term (current) use of insulin: Secondary | ICD-10-CM

## 2021-08-08 MED ORDER — TRESIBA FLEXTOUCH 100 UNIT/ML ~~LOC~~ SOPN: 14.0000 [IU] | PEN_INJECTOR | Freq: Every day | SUBCUTANEOUS | 2 refills | Status: DC

## 2021-08-08 NOTE — Telephone Encounter (Signed)
After reviewing with psychiatry, also recommended (if that was not working) , could try lexapro 5mg  q pm.  Sometimes this can help with sleep.

## 2021-08-08 NOTE — Patient Instructions (Signed)
Visit Information  PATIENT GOALS:  Goals Addressed               This Visit's Progress     Patient Stated     Medication Monitoring (pt-stated)        Patient Goals/Self-Care Activities Over the next 90 days, patient will:  - take medications as prescribed check blood glucose periodically, document, and provide at future appointments check blood pressure periodically, document, and provide at future appointments collaborate with provider on medication access solutions           Patient verbalizes understanding of instructions provided today and agrees to view in [place].   Plan: Telephone follow up appointment with care management team member scheduled for:  10 weeks   [name] Darnelle Maffucci, PharmD, [place], [place] Clinical Pharmacist [employer] [facility] [employer] [phone]

## 2021-08-08 NOTE — Telephone Encounter (Signed)
Patient had visit with [name] this morning. She is already taking remeron 30 mg qhs with melatonin 10 mg qhs.

## 2021-08-08 NOTE — Chronic Care Management (AMB) (Signed)
Chronic Care Management Pharmacy Note  08/08/2021 Name:  KIP KAUTZMAN MRN:  [id number] DOB:  [birth year]    Subjective: Ashley Gross is an 70 y.o. year old female who is a primary patient of Einar Pheasant, MD.  The CCM team was consulted for assistance with disease management and care coordination needs.    Engaged with patient by telephone for follow up visit in response to provider referral for pharmacy case management and/or care coordination services.   Consent to Services:  The patient was given information about Chronic Care Management services, agreed to services, and gave verbal consent prior to initiation of services.  Please see initial visit note for detailed documentation.   Patient Care Team: Einar Pheasant, MD as PCP - General (Internal Medicine) De Hollingshead, RPH-CPP as Pharmacist (Pharmacist)   Objective:  Lab Results  Component Value Date   CREATININE 1.04 (H) 07/17/2021   CREATININE 0.92 03/21/2021   CREATININE 0.97 11/13/2020    Lab Results  Component Value Date   HGBA1C 5.4 03/21/2021   Last diabetic Eye exam:  Lab Results  Component Value Date/Time   HMDIABEYEEXA No Retinopathy 05/31/2020 12:00 AM    Last diabetic Foot exam:  Lab Results  Component Value Date/Time   HMDIABFOOTEX on my exam.  05/23/2021 12:00 AM        Component Value Date/Time   CHOL 114 03/21/2021 1038   TRIG 79.0 03/21/2021 1038   HDL 44.30 03/21/2021 1038   CHOLHDL 3 03/21/2021 1038   VLDL 15.8 03/21/2021 1038   LDLCALC 54 03/21/2021 1038   LDLCALC 56 11/01/2018 1035   LDLDIRECT 74.0 04/19/2019 1056    Hepatic Function Latest Ref Rng & Units 03/21/2021 11/13/2020 07/19/2020  Total Protein 6.0 - 8.3 g/dL 5.4(L) 6.0 5.9(L)  Albumin 3.5 - 5.2 g/dL 3.5 4.0 3.7  AST 0 - 37 U/L _0 ALT 0 - 35 U/L _1 Alk Phosphatase 39 - 117 U/L 84 105 96  Total Bilirubin 0.2 - 1.2 mg/dL 0.5 0.6 0.5  Bilirubin, Direct 0.0 - 0.3 mg/dL 0.1 0.1 0.1    Lab  Results  Component Value Date/Time   TSH 1.83 11/13/2020 08:59 AM   TSH 2.35 08/21/2019 08:30 AM    CBC Latest Ref Rng & Units 04/09/2021 03/21/2021 03/29/2020  WBC 4.0 - 10.5 K/uL 11.1(H) 6.2 5.7  Hemoglobin 12.0 - 15.0 g/dL 11.6(L) 11.4(L) 12.3  Hematocrit 36.0 - 46.0 % 35.5(L) 34.9(L) 37.8  Platelets 150.0 - 400.0 K/uL 397.0 277.0 241.0    Lab Results  Component Value Date/Time   VD25OH 24.70 (L) 03/29/2020 09:41 AM   VD25OH 24 (L) 11/01/2018 10:35 AM   VD25OH 8.96 (L) 06/21/2018 09:58 AM    Social History   Tobacco Use  Smoking Status Never  Smokeless Tobacco Never   BP Readings from Last 3 Encounters:  07/27/21 118/78  07/24/21 121/62  07/17/21 119/73   Pulse Readings from Last 3 Encounters:  07/24/21 92  07/24/21 75  07/17/21 86   Wt Readings from Last 3 Encounters:  07/24/21 121 lb 12.8 oz (55.2 kg)  07/24/21 120 lb 8 oz (54.7 kg)  07/17/21 121 lb 3.2 oz (55 kg)    Assessment: Review of patient past medical history, allergies, medications, health status, including review of consultants reports, laboratory and other test data, was performed as part of comprehensive evaluation and provision of chronic care management services.   SDOH:  (Social Determinants of Health) assessments and  interventions performed:    CCM Care Plan  Allergies  Allergen Reactions   Aspirin Other (See Comments)    Increased bleeding   Beta Adrenergic Blockers    Lisinopril     cough   Mtx Support [Cobalamine Combinations] Other (See Comments)    Respiratory symptoms   Vasotec [Enalapril] Cough   Verapamil Other (See Comments)    Heart block   Voltaren [Diclofenac Sodium]    Erythromycin Other (See Comments)    Gi intolerance   Indocin [Indomethacin]    Jardiance [Empagliflozin] Other (See Comments)    Recurrent yeast infections, even with washout and rechallenge    Medications Reviewed Today     Reviewed by De Hollingshead, RPH-CPP (Pharmacist) on 08/08/21 at [phone]   Med List Status: <None>   Medication Order Taking? Sig Documenting Provider Last Dose Status Informant  acetaminophen (TYLENOL) 325 MG tablet [id number] Yes Take 650 mg by mouth every 6 (six) hours as needed. [provider] Taking Active            Med Note Nat Christen Dec 26, 2020  3:57 PM) Using while at work  albuterol (PROVENTIL) (2.5 MG/3ML) 0.083% nebulizer solution [id number] Yes Take 3 mLs (2.5 mg total) by nebulization every 4 (four) hours as needed for wheezing or shortness of breath. McLean-Scocuzza, Nino Glow, MD Taking Active   amLODipine (NORVASC) 5 MG tablet [id number] Yes TAKE 1 TABLET(5 MG) BY MOUTH TWICE DAILY Einar Pheasant, MD Taking Active   budesonide-formoterol [facility]) 160-4.5 MCG/ACT inhaler [id number] Yes Inhale 2 puffs into the lungs 2 (two) times daily. Einar Pheasant, MD Taking Active   Calcium Carbonate-Vit D-Min (CALCIUM 600+D PLUS MINERALS) 600-400 MG-UNIT TABS [id number] Yes Take by mouth. [provider] Taking Active   Cholecalciferol (VITAMIN D-3) 1000 UNITS CAPS [id number] Yes Take 1 capsule by mouth daily. [provider] Taking Active   cyclobenzaprine (FLEXERIL) 10 MG tablet [id number] Yes TAKE 1 TABLET BY MOUTH EVERY NIGHT AS NEEDED Einar Pheasant, MD Taking Active   insulin degludec (TRESIBA FLEXTOUCH) 100 UNIT/ML FlexTouch Pen [id number] Yes Inject 18 Units into the skin daily. Einar Pheasant, MD Taking Active            Med Note Darnelle Maffucci, Arville Lime   Fri Aug 08, 2021  9:40 AM) 16 units  losartan (COZAAR) 100 MG tablet [id number] Yes TAKE 1 TABLET BY MOUTH EVERY DAY Einar Pheasant, MD Taking Active   mirtazapine (REMERON) 15 MG tablet [id number] No TAKE 1 TO 2 TABLETS BY MOUTH AT BEDTIME  Patient not taking: Reported on 08/08/2021   Einar Pheasant, MD Not Taking Active   omeprazole (PRILOSEC) 20 MG capsule [id number] Yes TAKE 1 CAPSULE(20 MG) BY MOUTH TWICE DAILY Einar Pheasant, MD Taking Active   ONE TOUCH ULTRA  TEST test strip [id number]  PATIENT NEEDS NEW METER STRIPS AND LANCETS FOR ONE TOUCH. HER INSURANCE NO LONGER COVERS ACCUCHEK. Einar Pheasant, MD  Active   Pancrelipase, Lip-Prot-Amyl, [facility]) [phone] units CPEP [id number] No Take 2 capsules with the first bite of each meal and 1 capsule with the first bite of each snack  Patient not taking: Reported on 08/08/2021   Lin Landsman, MD Not Taking Active   rosuvastatin (CRESTOR) 10 MG tablet [id number] Yes TAKE 1 TABLET(10 MG) BY MOUTH DAILY Einar Pheasant, MD Taking Active   sodium chloride (OCEAN) 0.65 % SOLN nasal spray [id number] Yes Place 2 sprays into both nostrils as needed for congestion. 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   Vitamin D deficiency 11/01/2018   Tendinitis of upper biceps tendon of left shoulder 09/20/2018   Mild depression 09/15/2018   Mild intermittent asthma without complication 64/33/2951   Controlled type 2 diabetes mellitus with complication, with long-term current use of insulin ([place]) 09/15/2018   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   Traumatic incomplete tear of left rotator cuff 09/09/2018   Osteoporosis, 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   Low back pain 03/29/2016   Stress 12/12/2014   Congestion of throat 01/06/2014   Chronic diarrhea of unknown origin 02/16/2013   Inflammatory polyps of colon ([place]) 02/16/2013   Personal history of colonic polyps 02/10/2013   Hypertension 10/06/2012   GERD (gastroesophageal reflux disease) 10/06/2012   Hypercholesterolemia 10/06/2012    Psoriatic arthritis ([place]) 06/29/2012    Immunization History  Administered Date(s) Administered   Fluad Quad(high Dose 65+) 07/20/2019, 07/23/2020, 07/24/2021   Influenza Whole 07/03/2012   Influenza, High Dose Seasonal PF 07/12/2017, 08/01/2018   Influenza, Seasonal, Injecte, Preservative Fre 09/04/2013   Influenza-Unspecified 07/17/2014, 07/31/2015, 07/15/2016   PFIZER Comirnaty(Gray Top)Covid-19 Tri-Sucrose Vaccine 03/25/2021   PFIZER(Purple Top)SARS-COV-2 Vaccination 12/13/2019, 01/03/2020, 08/15/2020   Pneumococcal Conjugate-13 05/17/2017   Pneumococcal Polysaccharide-23 11/13/2020    Conditions to be addressed/monitored: HTN, HLD, and DMII  Care Plan : Medication Management  Updates made by De Hollingshead, RPH-CPP since 08/08/2021 12:00 AM     Problem: Diabetes, Hypertension, Hyperlipidemia      Long-Range Goal: Disease Progression Prevention   This Visit's Progress: On track  Recent Progress: On track  Priority: High  Note:   Current Barriers:  Unable to independently afford treatment regimen  Pharmacist Clinical Goal(s):  Over the next 90 days, patient will verbalize ability to afford treatment regimen. Over the next 90 days, patient will maintain control of diabetes as evidenced by improvement in A1c through collaboration with PharmD and provider.   Interventions: 1:1 collaboration with Einar Pheasant, MD regarding development and update of comprehensive plan of care as evidenced by provider attestation and co-signature Inter-disciplinary care team collaboration (see longitudinal plan of care) Comprehensive medication review performed; medication list updated in electronic medical record  Diabetes: Controlled; current treatment: Tresiba 16 units daily; Ozempic 0.5 mg - held d/t weight loss  Hx recurrent UTIs on Jardiance,  Denies any change in glucose readings since discontinuation of Ozempic  Current glucose readings: fastings 70-90s; post prandial  also in low 100s, max 120s Reduce Tresia to 14 units daily to reduce risk of hypoglycemia. [employer] and requested they take Ozempic off automatic refill. Advised to contact me with any episodes of hypoglycemia.  Given diagnoses of exocrine pancreatic insufficiency prompting Creon use, along with patient reported lack of change in glucose readings since discontinuing Ozempic, will place order for c-peptide to evaluate endocrine insulin production  Hypertension: Controlled per last clinic reading; current treatment: amlodipine 5 mg BID, losartan 100 mg QPM  Previously recommended to continue current regimen with collaboration with PCP   Hyperlipidemia: Controlled per last lipid panel;  current treatment: rosuvastatin 10 mg daily Previously recommended to continue current regimen at this time  Asthma: Controlled per patient symptom report; current regimen: Symbicort 160/4.5 mcg 2 puffs BID Approved for Symbicort assistance through 11/01/21. Reviewed refill procedure Previously recommended to continue current regimen at this time.  Grief/Insomnia: Insomnia worsened; current regimen: mirtazapine 30 mg daily- though is holding right now for psych follow up; melatonin 10 mg daily   Previously tried: hx melatonin, trazodone - no benefit Will continue to collaborate w/ patient, provider to determine best next steps in care. Recommend consideration for therapy along with pharmacotherapy, if patient amenable.   Pancreatic Enzyme Insufficiency: Uncontrolled; current regimen: none; prescribed Creon, but was expensive (Tier 3 per HTA). Switched to [employer], but similar cost (also Tier 3). Does not have samples.  Discussed Creon patient assistance through [employer]. Will collaborate w/ patient, Dr. Marius Ditch, and CPhT to pursue assistance. Contacted the pharmacy to request proof of copay.   Patient Goals/Self-Care Activities Over the next 90 days, patient will:  - take medications as  prescribed check blood glucose periodically, document, and provide at future appointments check blood pressure periodically, document, and provide at future appointments collaborate with provider on medication access solutions  Follow Up Plan: Telephone follow up appointment with care management team member scheduled for: ~12 weeks     Medication Assistance:  [name], Symbicort obtained through [employer], Symbicort medication assistance program.  Enrollment ends 11/01/21.  Marland Kitchen Application for Creon in process   Patient's preferred pharmacy is:  [facility] [place], [address] AT [place] [address] Phone: [phone] Fax: [phone]  [id number] [address] [place]: [phone]: [phone]  Follow Up:  Patient agrees to Care Plan and Follow-up.  Plan: Telephone follow up appointment with care management team member scheduled for:  10 weeks  [name] TDarnelle Maffucci PharmD, [place] CF[place]burgClinical Pharmacist LOccidental Petroleumat BJohnson [employer] [phone]

## 2021-08-11 NOTE — Telephone Encounter (Signed)
Called patient. Unable to leave message.

## 2021-08-11 NOTE — Telephone Encounter (Signed)
Ashley Gross with Dr Vaught's office calling to check on the status of a fax sent 08/05/21. States this was a cover page asking about a medication for the patient.   Please advise

## 2021-08-12 ENCOUNTER — Telehealth: Payer: Self-pay | Admitting: Pharmacy Technician

## 2021-08-12 ENCOUNTER — Other Ambulatory Visit: Payer: Self-pay

## 2021-08-12 DIAGNOSIS — Z596 Low income: Secondary | ICD-10-CM

## 2021-08-12 MED ORDER — ESCITALOPRAM OXALATE 5 MG PO TABS
5.0000 mg | ORAL_TABLET | Freq: Every day | ORAL | 1 refills | Status: DC
Start: 2021-08-12 — End: 2021-08-29

## 2021-08-12 NOTE — Telephone Encounter (Signed)
She has had some issues previously with lower blood pressure.  Please confirm if she is taking amlodipine 74m bid.  If this is correct and if she needs to start triam/hctz, then have her change amlodipine to 1 q day (instead of bid).  Also, would recommend starting 1/2 triam/hctz q day.  Will need to follow pressures, potassium and kidney function.  Schedule lab -(just a met b) -  in 7-10 days.  Call in blood pressure readings.  Also, need to confirm if taking any potassium supplements.

## 2021-08-12 NOTE — Progress Notes (Signed)
[facility], [place])                                            [place] Team    08/12/2021  Ashley Gross 02/03/1951 [id number]                                      Medication Assistance Referral  Referral From: [facility] Embedded RPh Ashley T.   Medication/Company: [employer] / [employer] Patient application portion:  Mailed Provider application portion: Faxed  to Dr. Lin Gross Provider address/fax verified via: Office website  Ashley Berthelot P. Ashley Gross, [place]  [phone]

## 2021-08-12 NOTE — Telephone Encounter (Signed)
Patient agreeable to try lexapro for sleep. Will send in. Patient confirmed that she is taking amlodipine 5 mg bid. She is not on potassium supplements. Also, Sandy from ENT said that Dr Pryor Ochoa was recommending the triamterene with out the hctz. Just wanted to confirm what needed to be sent in or if I am just giving clearance for ENT to send in

## 2021-08-12 NOTE — Telephone Encounter (Signed)
Notify them ok for trial of triamterene.  ENT will send in dose they want to start.  Also, still have her decrease amlodipine to one q day. Follow pressures.  Still would like to recheck met b in 7-10 days.

## 2021-08-12 NOTE — Telephone Encounter (Signed)
ENT is requesting clearance for pt to take triamterene for possible meineres. She is faxing over the note and hearing test for you to review. Advised that we did not receive anything on 10/3.

## 2021-08-13 NOTE — Telephone Encounter (Signed)
Patient is returning your call from earlier.Please call her at [phone].

## 2021-08-13 NOTE — Telephone Encounter (Signed)
LM for ENT and patient.

## 2021-08-13 NOTE — Telephone Encounter (Signed)
ENT calling back in and informed. They verbalized understanding

## 2021-08-14 ENCOUNTER — Other Ambulatory Visit: Payer: Self-pay

## 2021-08-14 DIAGNOSIS — I10 Essential (primary) hypertension: Secondary | ICD-10-CM

## 2021-08-14 NOTE — Telephone Encounter (Signed)
Called pt  Unable to leave message

## 2021-08-14 NOTE — Telephone Encounter (Signed)
I spoke with patient & let her know that Dr. Darien Ramus office was given the verbal to send in trail of Triamterene. I cannot see where this has been sent. I called [facility] & was place don hold for a long time, so called Dr. Darien Ramus office instead LMTCB. I let patient know this & will let her know when I know that script was officially sent. Also she was informed to only take one of the 5 mg tablets of amlodipine when she does start the triamterene. She is scheduled for BMP next Friday.

## 2021-08-14 NOTE — Telephone Encounter (Signed)
Triamterene does not come in 25 mg Sandy from Dr. Darien Ramus office stated. The pharmacy called them & asked could they get clearance to do the 50 mg. It comes in a capsule so cannot be broken in half. Please advise?

## 2021-08-15 ENCOUNTER — Other Ambulatory Visit: Payer: Self-pay

## 2021-08-15 ENCOUNTER — Ambulatory Visit
Admission: RE | Admit: 2021-08-15 | Discharge: 2021-08-15 | Disposition: A | Discharge status: Home / Self Care | Payer: PPO | Source: Ambulatory Visit | Attending: Otolaryngology | Admitting: Otolaryngology

## 2021-08-15 DIAGNOSIS — R42 Dizziness and giddiness: Secondary | ICD-10-CM | POA: Diagnosis not present

## 2021-08-15 DIAGNOSIS — H903 Sensorineural hearing loss, bilateral: Secondary | ICD-10-CM | POA: Diagnosis not present

## 2021-08-15 DIAGNOSIS — G9389 Other specified disorders of brain: Secondary | ICD-10-CM | POA: Diagnosis not present

## 2021-08-15 DIAGNOSIS — H905 Unspecified sensorineural hearing loss: Secondary | ICD-10-CM | POA: Diagnosis not present

## 2021-08-15 DIAGNOSIS — H9190 Unspecified hearing loss, unspecified ear: Secondary | ICD-10-CM | POA: Diagnosis not present

## 2021-08-15 MED ORDER — GADOBUTROL 1 MMOL/ML IV SOLN
5.0000 mL | Freq: Once | INTRAVENOUS | Status: AC | PRN
  Administered 2021-08-15: 5 mL via INTRAVENOUS

## 2021-08-15 NOTE — Telephone Encounter (Signed)
Ok to start 50mg  triamterene.  I do want her to decrease her amlodipine to 5mg  q day instead of bid.  Follow pressures. Send in readings and call if problem.

## 2021-08-15 NOTE — Telephone Encounter (Signed)
LM for [place] at ENT. Patient is aware to decrease amlodipine.

## 2021-08-19 ENCOUNTER — Telehealth (HOSPITAL_COMMUNITY): Payer: Self-pay

## 2021-08-19 ENCOUNTER — Other Ambulatory Visit (HOSPITAL_COMMUNITY): Payer: Self-pay | Admitting: Interventional Radiology

## 2021-08-19 DIAGNOSIS — R42 Dizziness and giddiness: Secondary | ICD-10-CM

## 2021-08-19 DIAGNOSIS — I729 Aneurysm of unspecified site: Secondary | ICD-10-CM

## 2021-08-19 DIAGNOSIS — H903 Sensorineural hearing loss, bilateral: Secondary | ICD-10-CM

## 2021-08-19 NOTE — Telephone Encounter (Signed)
Triamterene was sent in yesterday per [facility] at ENT.

## 2021-08-19 NOTE — Telephone Encounter (Signed)
Called to schedule consult, no answer, left vm. AW  

## 2021-08-20 ENCOUNTER — Ambulatory Visit (HOSPITAL_COMMUNITY)
Admission: RE | Admit: 2021-08-20 | Discharge: 2021-08-20 | Disposition: A | Discharge status: Home / Self Care | Payer: PPO | Source: Ambulatory Visit | Attending: Neuroradiology | Admitting: Neuroradiology

## 2021-08-20 ENCOUNTER — Other Ambulatory Visit: Payer: Self-pay

## 2021-08-20 ENCOUNTER — Other Ambulatory Visit (HOSPITAL_COMMUNITY): Payer: Self-pay | Admitting: Neuroradiology

## 2021-08-20 ENCOUNTER — Encounter: Payer: Self-pay | Admitting: Internal Medicine

## 2021-08-20 DIAGNOSIS — I671 Cerebral aneurysm, nonruptured: Secondary | ICD-10-CM | POA: Insufficient documentation

## 2021-08-20 DIAGNOSIS — H903 Sensorineural hearing loss, bilateral: Secondary | ICD-10-CM

## 2021-08-20 DIAGNOSIS — R42 Dizziness and giddiness: Secondary | ICD-10-CM

## 2021-08-20 DIAGNOSIS — I729 Aneurysm of unspecified site: Secondary | ICD-10-CM

## 2021-08-20 NOTE — Consult Note (Signed)
Chief Complaint: Patient was seen in consultation today for anterior communicating artery aneurysm.  Referring Physician(s): Vaught, [place]  Supervising Physician: Pedro Earls  Patient Status: [facility] - Out[facility]  History of Present Illness: Ashley Gross is a 70 y.o. female with a past medical history of colitis, asthma, DM, hypertension, anemia and psoriatic arthritis. 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 Patient denies smoking. There is no known family history of aneurysm. She denies any neurological complaints other than the dizziness, SNHL and tinnitus.  Past Medical History:  Diagnosis Date   Abnormal liver function test 10/06/2012   Anemia, unspecified    Asthma    B12 deficiency 02/11/2019   Bleeding internal hemorrhoids 09/13/2017   Choledocholithiasis 06/29/2012   Formatting of this note might be different from the original. Dilated CBD on abdominal imaging 04/2012   Chronic diarrhea 02/16/2013   Colitis    Complication of anesthesia    asthma attack after shoulder surgery   Diabetes mellitus ([place])    Esophagitis    H/O hiatal hernia    Heart murmur    Hx of arteriovenous malformation (AVM)    Hypertension    IBS (irritable bowel syndrome)    Nephrolithiasis    Pernicious anemia    Personal history of colonic polyps 02/10/2013   02/08/13 colonoscopy - question of rectal varices, two 3-35mm polyps in the sigmoid colon, mass at the hepatic flexure, one 19mm polyp at the hepatic flexure, nodule at the ileocecal valve, congested mucusa in the entire colon, flattened villi mucosa in the terminal ileum.     Pneumonia    Psoriatic arthritis ([place])    s/p penicillamine, plaquenil, MTX, sulfasalazine.  s/p Embrel, Humira,.  Iritis.     Pure hypercholesterolemia    Rectal bleeding 07/15/2017    Past Surgical History:  Procedure Laterality Date    ABDOMINAL HYSTERECTOMY  1981   ovaries left in place   ANKLE SURGERY     right   BUNIONECTOMY     CHOLECYSTECTOMY  1989   COLONOSCOPY WITH PROPOFOL N/A 12/27/2017   Procedure: COLONOSCOPY WITH PROPOFOL;  Surgeon: Lin Landsman, MD;  Location: [facility] ENDOSCOPY;  Service: Gastroenterology;  Laterality: N/A;   COLONOSCOPY WITH PROPOFOL N/A 01/04/2019   Procedure: COLONOSCOPY WITH PROPOFOL;  Surgeon: Lin Landsman, MD;  Location: [place];  Service: Endoscopy;  Laterality: N/A;  Diabetic - oral and injectable   COLONOSCOPY WITH PROPOFOL N/A 07/24/2021   Procedure: COLONOSCOPY WITH PROPOFOL;  Surgeon: Lin Landsman, MD;  Location: [place];  Service: Endoscopy;  Laterality: N/A;  Diabetic   ESOPHAGOGASTRODUODENOSCOPY (EGD) WITH PROPOFOL N/A 12/27/2017   Procedure: ESOPHAGOGASTRODUODENOSCOPY (EGD) WITH PROPOFOL;  Surgeon: Lin Landsman, MD;  Location: [facility] ENDOSCOPY;  Service: Gastroenterology;  Laterality: N/A;   HAND SURGERY     right   HEEL SPUR SURGERY     NOSE SURGERY     x2   POLYPECTOMY  01/04/2019   Procedure: POLYPECTOMY;  Surgeon: Lin Landsman, MD;  Location: [place];  Service: Endoscopy;;   POLYPECTOMY N/A 07/24/2021   Procedure: POLYPECTOMY;  Surgeon: Lin Landsman, MD;  Location: [place];  Service: Endoscopy;  Laterality: N/A;   SHOULDER ARTHROSCOPY WITH OPEN ROTATOR CUFF REPAIR Left 09/20/2018   Procedure: SHOULDER ARTHROSCOPY WITH OPEN ROTATOR CUFF REPAIR;  Surgeon: Corky Mull, MD;  Location: [facility] ORS;  Service: Orthopedics;  Laterality: Left;   SHOULDER CLOSED REDUCTION Left 12/21/2018   Procedure: MANIPULATION UNDER ANESTHESIA WITH STEROID INJECTION;  Surgeon: Corky Mull, MD;  Location: [place];  Service: Orthopedics;  Laterality: Left;  Diabetic - insulin and oral meds   UMBILICAL HERNIA REPAIR      Allergies: Aspirin, Beta adrenergic blockers, Lisinopril, Mtx support [cobalamine  combinations], Vasotec [enalapril], Verapamil, Voltaren [diclofenac sodium], Erythromycin, Indocin [indomethacin], and Jardiance [empagliflozin]  Medications: Prior to Admission medications   Medication Sig Start Date End Date Taking? Authorizing Provider  acetaminophen (TYLENOL) 325 MG tablet Take 650 mg by mouth every 6 (six) hours as needed.    [provider]  albuterol (PROVENTIL) (2.5 MG/3ML) 0.083% nebulizer solution Take 3 mLs (2.5 mg total) by nebulization every 4 (four) hours as needed for wheezing or shortness of breath. 04/02/21   McLean-Scocuzza, Nino Glow, MD  amLODipine (NORVASC) 5 MG tablet TAKE 1 TABLET(5 MG) BY MOUTH TWICE DAILY 02/27/21   Einar Pheasant, MD  budesonide-formoterol [facility]) 160-4.5 MCG/ACT inhaler Inhale 2 puffs into the lungs 2 (two) times daily. 11/13/20   Einar Pheasant, MD  Calcium Carbonate-Vit D-Min (CALCIUM 600+D PLUS MINERALS) 600-400 MG-UNIT TABS Take by mouth.    [provider]  Cholecalciferol (VITAMIN D-3) 1000 UNITS CAPS Take 1 capsule by mouth daily.    [provider]  cyclobenzaprine (FLEXERIL) 10 MG tablet TAKE 1 TABLET BY MOUTH EVERY NIGHT AS NEEDED 07/28/21   Einar Pheasant, MD  escitalopram (LEXAPRO) 5 MG tablet Take 1 tablet (5 mg total) by mouth at bedtime. 08/12/21   Einar Pheasant, MD  insulin degludec (TRESIBA FLEXTOUCH) 100 UNIT/ML FlexTouch Pen Inject 14 Units into the skin daily. 08/08/21   Einar Pheasant, MD  losartan (COZAAR) 100 MG tablet TAKE 1 TABLET BY MOUTH EVERY DAY 02/27/21   Einar Pheasant, MD  mirtazapine (REMERON) 15 MG tablet TAKE 1 TO 2 TABLETS BY MOUTH AT BEDTIME Patient not taking: Reported on 08/08/2021 07/14/21   Einar Pheasant, MD  omeprazole (PRILOSEC) 20 MG capsule TAKE 1 CAPSULE(20 MG) BY MOUTH TWICE DAILY 06/27/21   Einar Pheasant, MD  ONE TOUCH ULTRA TEST test strip PATIENT NEEDS NEW METER STRIPS AND LANCETS FOR ONE TOUCH. HER INSURANCE NO LONGER COVERS ACCUCHEK. 07/31/15   Einar Pheasant, MD  Pancrelipase, Lip-Prot-Amyl, (ZENPEP) 40000-126000 units CPEP Take 2 capsules with the first bite of each meal and 1 capsule with the first bite of each snack Patient not taking: Reported on 08/08/2021 08/04/21   Lin Landsman, MD  rosuvastatin (CRESTOR) 10 MG tablet TAKE 1 TABLET(10 MG) BY MOUTH DAILY 02/27/21   Einar Pheasant, MD  sodium chloride (OCEAN) 0.65 % SOLN nasal spray Place 2 sprays into both nostrils as needed for congestion. 04/02/21   McLean-Scocuzza, Nino Glow, MD     Family History  Problem Relation Age of Onset   Diabetes Other    Hypertension Other    Breast cancer Neg Hx    Colon cancer Neg Hx     Social History   Socioeconomic History   Marital status: Married    Spouse name: Not on file   Number of children: 2   Years of education: Not on file   Highest education level: Not on file  Occupational History   Not on file  Tobacco Use   Smoking status: Never   Smokeless tobacco: Never  Vaping Use   Vaping Use: Never used  Substance and Sexual Activity   Alcohol use: No  Alcohol/week: 0.0 standard drinks   Drug use: No   Sexual activity: Not Currently  Other Topics Concern   Not on file  Social History Narrative   She is married and has two children   Social Determinants of Health   Financial Resource Strain: Medium Risk   Difficulty of Paying Living Expenses: Somewhat hard  Food Insecurity: No Food Insecurity   Worried About Charity fundraiser in the Last Year: Never true   Ran Out of Food in the Last Year: Never true  Transportation Needs: No Transportation Needs   Lack of Transportation (Medical): No   Lack of Transportation (Non-Medical): No  Physical Activity: Not on file  Stress: No Stress Concern Present   Feeling of Stress : Only a little  Social Connections: Unknown   Frequency of Communication with Friends and Family: More than three times a week   Frequency of Social Gatherings with Friends and Family: Not on file    Attends Religious Services: Not on file   Active Member of Clubs or Organizations: Not on file   Attends Archivist Meetings: Not on file   Marital Status: Not on file     Review of Systems: A 12 point ROS discussed and pertinent positives are indicated in the HPI above.  All other systems are negative.  Review of Systems  Vital Signs: There were no vitals taken for this visit.  Physical Exam Constitutional:      Appearance: Normal appearance.  Eyes:     Extraocular Movements: Extraocular movements intact.     Pupils: Pupils are equal, round, and reactive to light.  Neurological:     General: No focal deficit present.     Mental Status: She is alert and oriented to person, place, and time.     Cranial Nerves: Facial asymmetry present.     Sensory: Sensation is intact.     Motor: Motor function is intact.     Comments: There is a subtle facial asymmetry with minimal left nasolabial folding flattening which appear to be pt's baseline.         Imaging: CT Abdomen Pelvis W Contrast  Result Date: 08/07/2021 CLINICAL DATA:  Weight loss, unintended. History of irritable bowel syndrome. EXAM: CT ABDOMEN AND PELVIS WITH CONTRAST TECHNIQUE: Multidetector CT imaging of the abdomen and pelvis was performed using the standard protocol following bolus administration of intravenous contrast. CONTRAST:  73mL OMNIPAQUE IOHEXOL 350 MG/ML SOLN COMPARISON:  Xray Chest 08/08/2020. U/S Abdomen 04/28/2012. CT Chest 09/11/2004. FINDINGS: Lower chest: No acute abnormality. Hepatobiliary: No focal liver abnormality. Status post cholecystectomy. Mild intrahepatic biliary dilatation with increase caliber of the common bile duct which measures up to 1.1 cm. No signs of choledocholithiasis or mass. Pancreas: Unremarkable. No pancreatic ductal dilatation or surrounding inflammatory changes. Spleen: Normal in size without focal abnormality. Adrenals/Urinary Tract: Normal adrenal glands. Bilateral renal  cortical scarring identified. No signs of hydronephrosis, nephrolithiasis or mass. Bladder unremarkable. Stomach/Bowel: Stomach is within normal limits. Proximal and mid small bowel loops are unremarkable. There is edema involving the wall of the terminal ileum with wall thickness measuring up to 0.8 cm. Marked diffuse colonic wall edema and surrounding inflammation is noted from the cecum up to the level of the rectum compatible with pancolitis. There is diffuse surrounding inflammatory fat stranding. Diffuse mucosal enhancement is also identified. No convincing evidence for pneumatosis. No portal venous gas identified. Vascular/Lymphatic: Aortic atherosclerosis. No aneurysm. No abdominopelvic adenopathy. Reproductive: Status post hysterectomy. No adnexal masses. Other:  Small volume of free fluid identified within the pelvis. No focal fluid collections identified. No signs of pneumoperitoneum. Musculoskeletal: No acute or significant osseous findings. IMPRESSION: 1. Signs of severe pancolitis identified with terminal ileitis. Favor inflammatory or infectious etiologies. Ulcerative colitis not excluded. No convincing evidence for pneumatosis, portal venous gas or abscess formation. 2. Status post cholecystectomy with mild intrahepatic biliary dilatation and increase caliber of the common bile duct. No signs of choledocholithiasis or mass. These results will be called to the ordering clinician or representative by the Radiologist Assistant, and communication documented in the PACS or [employer]. Electronically Signed   By: Kerby Moors M.D.   On: 08/07/2021 16:07   MR BRAIN/IAC W WO CONTRAST  Result Date: 08/15/2021 CLINICAL DATA:  Provided history: Dizziness. Bilateral sensorineural hearing loss. Additional history provided by scanning technologist: Patient reports bilateral ears "stopping up" and tinnitus for 7-8 months; left ear worse. EXAM: MRI HEAD WITHOUT AND WITH CONTRAST TECHNIQUE: Multiplanar,  multiecho pulse sequences of the brain and surrounding structures were obtained without and with intravenous contrast. CONTRAST:  88mL GADAVIST GADOBUTROL 1 MMOL/ML IV SOLN COMPARISON:  No pertinent prior exams available for comparison. FINDINGS: Brain: Cerebral volume is normal. Enhancing dural-based mass overlying the right frontal operculum measuring 9 x 4 mm compatible with small meningioma (series 19, image 99) (series 18, image 8). No parenchymal edema within the underlying right frontal lobe. 2 mm round T2 hyperintense and hypoenhancing focus within the posterior aspect of the pituitary gland at midline (series 19, image 56) (series 100, image 117) (series 16, image 44). No cortical encephalomalacia is identified. No significant cerebral white matter disease for age. No cerebellopontine angle or internal auditory canal mass is demonstrated. Unremarkable appearance of the 7th and 8th cranial nerves bilaterally. There is no acute infarct. No chronic intracranial blood products. No extra-axial fluid collection. No midline shift. Vascular: Maintained flow voids within the proximal large arterial vessels. Superiorly projecting aneurysm arising from the anterior communicating artery measuring approximately 5 x 4 mm. Skull and upper cervical spine: No focal suspicious marrow lesion. Sinuses/Orbits: Visualized orbits show no acute finding. Bilateral lens replacements. Postsurgical appearance of the paranasal sinuses. Mild mucosal thickening within the bilateral ethmoid, sphenoid and right maxillary sinuses. Impressions #2,3,4 will be called to the ordering clinician or representative by the Radiologist Assistant, and communication documented in the PACS or [employer]. IMPRESSION: No cerebellopontine angle or internal auditory canal mass. Superiorly projecting aneurysm arising from the anterior communicating artery, measuring approximately 5 x 4 mm. MR or CT angiography are recommended for further evaluation.  Additionally, neuro-interventional consultation is recommended. 9 x 4 mm meningioma overlying the right frontal lobe. No underlying parenchymal edema. 2 mm round lesion within the posterior pituitary gland, as described. This may reflect a Rathke's cleft cyst or a cystic pituitary microadenoma. Correlate with relevant endocrinologic laboratory values. A contrast-enhanced pituitary protocol brain MRI may be obtained for further characterization, if clinically warranted. Mild paranasal sinus mucosal thickening. Electronically Signed   By: Kellie Simmering D.O.   On: 08/15/2021 18:10    Labs:  CBC: Recent Labs    03/21/21 1038 04/09/21 1341  WBC 6.2 11.1*  HGB 11.4* 11.6*  HCT 34.9* 35.5*  PLT 277.0 397.0    COAGS: No results for input(s): INR, APTT in the last 8760 hours.  BMP: Recent Labs    11/13/20 0859 11/26/20 1413 03/21/21 1038 04/09/21 1341 04/22/21 1401 07/17/21 1635  NA 140  --  142  --   --   --  K 3.3* 4.4 3.4* 3.4* 3.8  --   CL 103  --  104  --   --   --   CO2 31  --  30  --   --   --   GLUCOSE 96  --  52*  --   --   --   BUN 12  --  10  --   --  15  CALCIUM 8.6  --  8.3*  --   --   --   CREATININE 0.97  --  0.92  --   --  1.04*    LIVER FUNCTION TESTS: Recent Labs    11/13/20 0859 03/21/21 1038  BILITOT 0.6 0.5  AST 16 18  ALT 8 12  ALKPHOS 105 84  PROT 6.0 5.4*  ALBUMIN 4.0 3.5    TUMOR MARKERS: No results for input(s): AFPTM, CEA, CA199, CHROMGRNA in the last 8760 hours.  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  We discussed the natural history of brain aneurysms and options for management, including observation, endovascular treatment and open neurosurgical clipping.  Signs and symptoms of brain aneurysm rupture were reviewed and she was instructed to call 911 should she experience any of the symptoms.  I explained that a diagnostic cerebral angiogram would give  better details of the aneurysm and its relationship with the parent artery.  Ashley Gross would like to have the diagnostic cerebral angiogram performed at the session as endovascular aneurysm treatment.  I explained to her that in this case, we would need to start her on dual anti-platelet therapy 5-7 days prior to intervention given the possibility of needing to use stent assisted technique. She understands and agrees with the plan.  All questions were answered to her satisfaction. One of our schedulers we will reach out to her to book her intervention.   Thank you for this interesting consult.  I greatly enjoyed meeting Ashley Gross and look forward to participating in their care.  A copy of this report was sent to the requesting provider on this date.  Electronically Signed: Pedro Earls, MD 08/20/2021, 3:18 PM   I spent a total of  40 Minutes   in face to face in clinical consultation, greater than 50% of which was counseling/coordinating care for brain aneurysm.

## 2021-08-21 ENCOUNTER — Encounter (HOSPITAL_COMMUNITY): Payer: Self-pay

## 2021-08-21 ENCOUNTER — Ambulatory Visit (HOSPITAL_COMMUNITY): Payer: PPO

## 2021-08-22 ENCOUNTER — Other Ambulatory Visit: Payer: PPO

## 2021-08-26 ENCOUNTER — Encounter: Payer: Self-pay | Admitting: Internal Medicine

## 2021-08-26 ENCOUNTER — Other Ambulatory Visit (HOSPITAL_COMMUNITY): Payer: Self-pay | Admitting: Neuroradiology

## 2021-08-26 ENCOUNTER — Telehealth: Payer: Self-pay | Admitting: Pharmacy Technician

## 2021-08-26 ENCOUNTER — Other Ambulatory Visit: Payer: Self-pay | Admitting: Internal Medicine

## 2021-08-26 DIAGNOSIS — Z596 Low income: Secondary | ICD-10-CM

## 2021-08-26 DIAGNOSIS — I671 Cerebral aneurysm, nonruptured: Secondary | ICD-10-CM

## 2021-08-26 NOTE — Progress Notes (Signed)
[facility])                                            [place] Team    08/26/2021  SHARRON PETRUSKA 14-Jun-1951 [id number]  Received both patient and provider portion(s) of patient assistance application(s) for [employer]. Faxed completed application and required documents into [employer].    Shamanda Len P. Tredarius Cobern, [place]  [phone]

## 2021-08-27 ENCOUNTER — Telehealth (HOSPITAL_COMMUNITY): Payer: Self-pay | Admitting: Radiology

## 2021-08-27 NOTE — Telephone Encounter (Signed)
Called pt, left VM for her to call me to schedule procedure with Dr. Lucianne Lei. JM

## 2021-08-27 NOTE — Telephone Encounter (Signed)
Please call and inform pt that we can increase the lexapro to 10mg  q day.  She is currently on 5mg  q day.  Also, I would like to refer her to psychiatry - given persistent issues.

## 2021-08-29 MED ORDER — ESCITALOPRAM OXALATE 10 MG PO TABS: 10.0000 mg | ORAL_TABLET | Freq: Every day | ORAL | 1 refills | Status: DC

## 2021-08-29 NOTE — Telephone Encounter (Signed)
LMTCB

## 2021-09-01 DIAGNOSIS — I10 Essential (primary) hypertension: Secondary | ICD-10-CM

## 2021-09-01 DIAGNOSIS — E118 Type 2 diabetes mellitus with unspecified complications: Secondary | ICD-10-CM

## 2021-09-01 DIAGNOSIS — E1121 Type 2 diabetes mellitus with diabetic nephropathy: Secondary | ICD-10-CM

## 2021-09-01 DIAGNOSIS — E7800 Pure hypercholesterolemia, unspecified: Secondary | ICD-10-CM

## 2021-09-01 DIAGNOSIS — Z794 Long term (current) use of insulin: Secondary | ICD-10-CM | POA: Diagnosis not present

## 2021-09-04 ENCOUNTER — Telehealth: Payer: Self-pay | Admitting: Pharmacy Technician

## 2021-09-04 DIAGNOSIS — Z596 Low income: Secondary | ICD-10-CM

## 2021-09-04 NOTE — Progress Notes (Signed)
[facility])                                            [place] Team    09/04/2021  Ashley Gross April 28, 1951 [id number]  Care coordination call placed to [employer] patient assistance company in regards to [employer] application.  Spoke to [place] who informed patient was APPROVED 09/04/21-11/01/22 and she is placing first order for delivery today. She informed the medication will be delivered to the provider's office in the next 5-10 business days and patient would receive a quantity of 300 to last for 1 month. She informed patient would have to call [employer] at [phone] each month to request a refill. She suggests patient call when she has around a 10 days supply remaining each month.  Temitope Flammer P. Lachae Hohler, [place]  [phone]

## 2021-09-10 DIAGNOSIS — R42 Dizziness and giddiness: Secondary | ICD-10-CM | POA: Diagnosis not present

## 2021-09-16 DIAGNOSIS — D329 Benign neoplasm of meninges, unspecified: Secondary | ICD-10-CM | POA: Diagnosis not present

## 2021-09-16 DIAGNOSIS — I729 Aneurysm of unspecified site: Secondary | ICD-10-CM | POA: Diagnosis not present

## 2021-09-16 DIAGNOSIS — H903 Sensorineural hearing loss, bilateral: Secondary | ICD-10-CM | POA: Diagnosis not present

## 2021-09-16 DIAGNOSIS — R42 Dizziness and giddiness: Secondary | ICD-10-CM | POA: Diagnosis not present

## 2021-09-16 DIAGNOSIS — E236 Other disorders of pituitary gland: Secondary | ICD-10-CM | POA: Diagnosis not present

## 2021-09-16 DIAGNOSIS — R519 Headache, unspecified: Secondary | ICD-10-CM | POA: Diagnosis not present

## 2021-09-18 ENCOUNTER — Other Ambulatory Visit: Payer: Self-pay | Admitting: Internal Medicine

## 2021-09-22 DIAGNOSIS — M778 Other enthesopathies, not elsewhere classified: Secondary | ICD-10-CM | POA: Diagnosis not present

## 2021-09-22 DIAGNOSIS — L600 Ingrowing nail: Secondary | ICD-10-CM | POA: Diagnosis not present

## 2021-09-22 DIAGNOSIS — L909 Atrophic disorder of skin, unspecified: Secondary | ICD-10-CM | POA: Diagnosis not present

## 2021-09-22 DIAGNOSIS — Q6689 Other  specified congenital deformities of feet: Secondary | ICD-10-CM | POA: Diagnosis not present

## 2021-09-29 ENCOUNTER — Other Ambulatory Visit: Payer: Self-pay | Admitting: Neuroradiology

## 2021-09-29 LAB — SARS CORONAVIRUS 2 (TAT 6-24 HRS): SARS Coronavirus 2: NEGATIVE

## 2021-09-30 ENCOUNTER — Encounter (HOSPITAL_COMMUNITY): Payer: Self-pay

## 2021-09-30 ENCOUNTER — Other Ambulatory Visit: Payer: Self-pay | Admitting: Radiology

## 2021-09-30 ENCOUNTER — Other Ambulatory Visit: Payer: Self-pay

## 2021-09-30 ENCOUNTER — Encounter (HOSPITAL_COMMUNITY): Payer: Self-pay | Admitting: *Deleted

## 2021-09-30 NOTE — Progress Notes (Signed)
Mrs. Eccleston denies chest pain or shortness of breath. Patient denies having any s/s of Covid in her household.  Patient denies any known exposure to Covid.   Mrs. Beamon has type II diabetes, patient reports that CBG run in th 70's in am and 130 in the afternoon.  I instructed Mrs. Mauch to take 1/2 of Tresiba+ 7 units at dinner tonight. I instructed patient to check CBG after awaking and every 2 hours until arrival  to the hospital.  I Instructed patient if CBG is less than 70 to take 4 Glucose Tablets or 1 tube of Glucose Gel or 1/2 cup of a clear juice. Recheck CBG in 15 minutes if CBG is not over 70 call, pre- op desk at [phone] for further instructions.  I instructed Mrs. Bellomo to shower with antibiotic soap, if it is available.  Dry off with a clean towel. Do not put lotion, powder, cologne or deodorant or makeup.No jewelry or piercings. Men may shave their face and neck. Woman should not shave. No nail polish, artificial or acrylic nails. Wear clean clothes, brush your teeth. Glasses, contact lens,dentures or partials may not be worn in the OR. If you need to wear them, please bring a case for glasses, do not wear contacts or bring a case, the hospital does not have contact cases, dentures or partials will have to be removed , make sure they are clean, we will provide a denture cup to put them in. You will need some one to drive you home and a responsible person over the age of 72 to stay with you for the first 24 hours after surgery.

## 2021-10-01 ENCOUNTER — Ambulatory Visit (HOSPITAL_COMMUNITY): Payer: PPO | Admitting: Certified Registered Nurse Anesthetist

## 2021-10-01 ENCOUNTER — Encounter (HOSPITAL_COMMUNITY): Payer: Self-pay

## 2021-10-01 ENCOUNTER — Encounter (HOSPITAL_COMMUNITY)
Admission: RE | Disposition: A | Discharge status: Home / Self Care | Payer: Self-pay | Source: Home / Self Care | Attending: Neuroradiology

## 2021-10-01 ENCOUNTER — Inpatient Hospital Stay (HOSPITAL_COMMUNITY)
Admission: RE | Admit: 2021-10-01 | Discharge: 2021-10-02 | DRG: 027 | Disposition: A | Discharge status: Home / Self Care | Payer: PPO | Attending: Neuroradiology | Admitting: Neuroradiology

## 2021-10-01 ENCOUNTER — Inpatient Hospital Stay (HOSPITAL_COMMUNITY)
Admission: RE | Admit: 2021-10-01 | Discharge: 2021-10-01 | Disposition: A | Discharge status: Home / Self Care | Payer: PPO | Source: Ambulatory Visit | Attending: Neuroradiology | Admitting: Neuroradiology

## 2021-10-01 DIAGNOSIS — Z90710 Acquired absence of both cervix and uterus: Secondary | ICD-10-CM

## 2021-10-01 DIAGNOSIS — Z7982 Long term (current) use of aspirin: Secondary | ICD-10-CM

## 2021-10-01 DIAGNOSIS — R519 Headache, unspecified: Secondary | ICD-10-CM

## 2021-10-01 DIAGNOSIS — I10 Essential (primary) hypertension: Secondary | ICD-10-CM | POA: Diagnosis not present

## 2021-10-01 DIAGNOSIS — E7800 Pure hypercholesterolemia, unspecified: Secondary | ICD-10-CM | POA: Diagnosis not present

## 2021-10-01 DIAGNOSIS — J4521 Mild intermittent asthma with (acute) exacerbation: Secondary | ICD-10-CM

## 2021-10-01 DIAGNOSIS — J45909 Unspecified asthma, uncomplicated: Secondary | ICD-10-CM | POA: Diagnosis present

## 2021-10-01 DIAGNOSIS — D510 Vitamin B12 deficiency anemia due to intrinsic factor deficiency: Secondary | ICD-10-CM | POA: Diagnosis not present

## 2021-10-01 DIAGNOSIS — Z87442 Personal history of urinary calculi: Secondary | ICD-10-CM

## 2021-10-01 DIAGNOSIS — Z9049 Acquired absence of other specified parts of digestive tract: Secondary | ICD-10-CM

## 2021-10-01 DIAGNOSIS — K219 Gastro-esophageal reflux disease without esophagitis: Secondary | ICD-10-CM | POA: Diagnosis not present

## 2021-10-01 DIAGNOSIS — Z8719 Personal history of other diseases of the digestive system: Secondary | ICD-10-CM | POA: Diagnosis not present

## 2021-10-01 DIAGNOSIS — K589 Irritable bowel syndrome without diarrhea: Secondary | ICD-10-CM | POA: Diagnosis not present

## 2021-10-01 DIAGNOSIS — E538 Deficiency of other specified B group vitamins: Secondary | ICD-10-CM | POA: Diagnosis present

## 2021-10-01 DIAGNOSIS — H905 Unspecified sensorineural hearing loss: Secondary | ICD-10-CM | POA: Diagnosis present

## 2021-10-01 DIAGNOSIS — I671 Cerebral aneurysm, nonruptured: Secondary | ICD-10-CM

## 2021-10-01 DIAGNOSIS — E119 Type 2 diabetes mellitus without complications: Secondary | ICD-10-CM | POA: Diagnosis not present

## 2021-10-01 DIAGNOSIS — Z8249 Family history of ischemic heart disease and other diseases of the circulatory system: Secondary | ICD-10-CM | POA: Diagnosis not present

## 2021-10-01 DIAGNOSIS — L4050 Arthropathic psoriasis, unspecified: Secondary | ICD-10-CM | POA: Diagnosis present

## 2021-10-01 DIAGNOSIS — R059 Cough, unspecified: Secondary | ICD-10-CM

## 2021-10-01 DIAGNOSIS — Z833 Family history of diabetes mellitus: Secondary | ICD-10-CM

## 2021-10-01 DIAGNOSIS — R509 Fever, unspecified: Secondary | ICD-10-CM

## 2021-10-01 HISTORY — PX: IR TRANSCATH/EMBOLIZ: IMG695

## 2021-10-01 HISTORY — DX: Gastro-esophageal reflux disease without esophagitis: K21.9

## 2021-10-01 HISTORY — PX: IR ANGIO INTRA EXTRACRAN SEL COM CAROTID INNOMINATE UNI L MOD SED: IMG5358

## 2021-10-01 HISTORY — PX: IR CT HEAD LTD: IMG2386

## 2021-10-01 HISTORY — PX: IR US GUIDE VASC ACCESS RIGHT: IMG2390

## 2021-10-01 HISTORY — PX: IR ANGIO VERTEBRAL SEL VERTEBRAL BILAT MOD SED: IMG5369

## 2021-10-01 HISTORY — DX: Personal history of urinary calculi: Z87.442

## 2021-10-01 HISTORY — PX: RADIOLOGY WITH ANESTHESIA: SHX6223

## 2021-10-01 HISTORY — PX: IR ANGIO INTRA EXTRACRAN SEL INTERNAL CAROTID UNI R MOD SED: IMG5362

## 2021-10-01 HISTORY — PX: IR 3D INDEPENDENT WKST: IMG2385

## 2021-10-01 LAB — CBC WITH DIFFERENTIAL/PLATELET
Abs Immature Granulocytes: 0.01 10*3/uL (ref 0.00–0.07)
Basophils Absolute: 0 10*3/uL (ref 0.0–0.1)
Basophils Relative: 1 %
Eosinophils Absolute: 0.1 10*3/uL (ref 0.0–0.5)
Eosinophils Relative: 1 %
HCT: 33.5 % — ABNORMAL LOW (ref 36.0–46.0)
Hemoglobin: 10.8 g/dL — ABNORMAL LOW (ref 12.0–15.0)
Immature Granulocytes: 0 %
Lymphocytes Relative: 33 %
Lymphs Abs: 1.4 10*3/uL (ref 0.7–4.0)
MCH: 29 pg (ref 26.0–34.0)
MCHC: 32.2 g/dL (ref 30.0–36.0)
MCV: 89.8 fL (ref 80.0–100.0)
Monocytes Absolute: 0.4 10*3/uL (ref 0.1–1.0)
Monocytes Relative: 10 %
Neutro Abs: 2.4 10*3/uL (ref 1.7–7.7)
Neutrophils Relative %: 55 %
Platelets: 256 10*3/uL (ref 150–400)
RBC: 3.73 MIL/uL — ABNORMAL LOW (ref 3.87–5.11)
RDW: 14.7 % (ref 11.5–15.5)
WBC: 4.4 10*3/uL (ref 4.0–10.5)
nRBC: 0 % (ref 0.0–0.2)

## 2021-10-01 LAB — APTT: aPTT: 23 seconds — ABNORMAL LOW (ref 24–36)

## 2021-10-01 LAB — TYPE AND SCREEN
ABO/RH(D): O POS
Antibody Screen: NEGATIVE

## 2021-10-01 LAB — BASIC METABOLIC PANEL
Anion gap: 8 (ref 5–15)
BUN: 13 mg/dL (ref 8–23)
CO2: 26 mmol/L (ref 22–32)
Calcium: 8.6 mg/dL — ABNORMAL LOW (ref 8.9–10.3)
Chloride: 105 mmol/L (ref 98–111)
Creatinine, Ser: 0.87 mg/dL (ref 0.44–1.00)
GFR, Estimated: >60 mL/min (ref >60)
Glucose, Bld: 114 mg/dL — ABNORMAL HIGH (ref 70–99)
Potassium: 3.7 mmol/L (ref 3.5–5.1)
Sodium: 139 mmol/L (ref 135–145)

## 2021-10-01 LAB — PROTIME-INR
INR: 1 (ref 0.8–1.2)
Prothrombin Time: 13 seconds (ref 11.4–15.2)

## 2021-10-01 LAB — GLUCOSE, CAPILLARY
Glucose-Capillary: 115 mg/dL — ABNORMAL HIGH (ref 70–99)
Glucose-Capillary: 145 mg/dL — ABNORMAL HIGH (ref 70–99)

## 2021-10-01 LAB — POCT ACTIVATED CLOTTING TIME
Activated Clotting Time: 149 seconds
Activated Clotting Time: 89 seconds
Activated Clotting Time: 179 seconds

## 2021-10-01 LAB — ABO/RH: ABO/RH(D): O POS

## 2021-10-01 LAB — MRSA NEXT GEN BY PCR, NASAL: MRSA by PCR Next Gen: NOT DETECTED

## 2021-10-01 SURGERY — RADIOLOGY WITH ANESTHESIA
Anesthesia: General

## 2021-10-01 MED ORDER — HEPARIN SODIUM (PORCINE) 1000 UNIT/ML IJ SOLN
INTRAMUSCULAR | Status: DC | PRN
  Administered 2021-10-01: 3000 [IU] via INTRAVENOUS
  Administered 2021-10-01: 1000 [IU] via INTRAVENOUS

## 2021-10-01 MED ORDER — CLEVIDIPINE BUTYRATE 0.5 MG/ML IV EMUL
INTRAVENOUS | Status: DC | PRN
  Administered 2021-10-01: 2 mg/h via INTRAVENOUS

## 2021-10-01 MED ORDER — ASPIRIN EC 81 MG PO TBEC: 81.0000 mg | DELAYED_RELEASE_TABLET | Freq: Once | ORAL | Status: DC

## 2021-10-01 MED ORDER — IOHEXOL 300 MG/ML  SOLN
100.0000 mL | Freq: Once | INTRAMUSCULAR | Status: AC | PRN
  Administered 2021-10-01: 100 mL via INTRAVENOUS

## 2021-10-01 MED ORDER — TICAGRELOR 90 MG PO TABS
90.0000 mg | ORAL_TABLET | Freq: Two times a day (BID) | ORAL | Status: DC
  Administered 2021-10-01 – 2021-10-02 (×2): 90 mg via ORAL
  Filled 2021-10-01 (×2): qty 1

## 2021-10-01 MED ORDER — CHLORHEXIDINE GLUCONATE 0.12 % MT SOLN
OROMUCOSAL | Status: AC
  Administered 2021-10-01: 15 mL
  Filled 2021-10-01: qty 15

## 2021-10-01 MED ORDER — TICAGRELOR 90 MG PO TABS: 180.0000 mg | ORAL_TABLET | Freq: Once | ORAL | Status: AC

## 2021-10-01 MED ORDER — IOHEXOL 300 MG/ML  SOLN
100.0000 mL | Freq: Once | INTRAMUSCULAR | Status: AC | PRN
  Administered 2021-10-01: 47 mL via INTRAVENOUS

## 2021-10-01 MED ORDER — ONDANSETRON HCL 4 MG/2ML IJ SOLN: 4.0000 mg | Freq: Once | INTRAMUSCULAR | Status: DC | PRN

## 2021-10-01 MED ORDER — ROCURONIUM BROMIDE 10 MG/ML (PF) SYRINGE
PREFILLED_SYRINGE | INTRAVENOUS | Status: DC | PRN
  Administered 2021-10-01: 50 mg via INTRAVENOUS
  Administered 2021-10-01: 10 mg via INTRAVENOUS

## 2021-10-01 MED ORDER — LIDOCAINE 2% (20 MG/ML) 5 ML SYRINGE
INTRAMUSCULAR | Status: DC | PRN
  Administered 2021-10-01: 100 mg via INTRAVENOUS

## 2021-10-01 MED ORDER — PHENYLEPHRINE HCL-NACL 20-0.9 MG/250ML-% IV SOLN
INTRAVENOUS | Status: DC | PRN
  Administered 2021-10-01: 25 ug/min via INTRAVENOUS

## 2021-10-01 MED ORDER — PROPOFOL 10 MG/ML IV BOLUS
INTRAVENOUS | Status: DC | PRN
  Administered 2021-10-01: 20 mg via INTRAVENOUS
  Administered 2021-10-01: 100 mg via INTRAVENOUS

## 2021-10-01 MED ORDER — FENTANYL CITRATE (PF) 100 MCG/2ML IJ SOLN
INTRAMUSCULAR | Status: AC
  Filled 2021-10-01: qty 2

## 2021-10-01 MED ORDER — LACTATED RINGERS IV SOLN: INTRAVENOUS | Status: DC

## 2021-10-01 MED ORDER — CEFAZOLIN SODIUM-DEXTROSE 2-4 GM/100ML-% IV SOLN
2.0000 g | Freq: Once | INTRAVENOUS | Status: AC
  Administered 2021-10-01: 2 g via INTRAVENOUS
  Filled 2021-10-01: qty 100

## 2021-10-01 MED ORDER — CLEVIDIPINE BUTYRATE 0.5 MG/ML IV EMUL: 0.0000 mg/h | INTRAVENOUS | Status: DC

## 2021-10-01 MED ORDER — IOHEXOL 300 MG/ML  SOLN: 100.0000 mL | Freq: Once | INTRAMUSCULAR | Status: DC | PRN

## 2021-10-01 MED ORDER — NITROGLYCERIN 1 MG/10 ML FOR IR/CATH LAB: INTRA_ARTERIAL | Status: AC | PRN

## 2021-10-01 MED ORDER — ACETAMINOPHEN 160 MG/5ML PO SOLN: 650.0000 mg | ORAL | Status: DC | PRN

## 2021-10-01 MED ORDER — CLEVIDIPINE BUTYRATE 0.5 MG/ML IV EMUL
INTRAVENOUS | Status: AC
  Filled 2021-10-01: qty 50

## 2021-10-01 MED ORDER — SODIUM CHLORIDE 0.9 % IV SOLN: INTRAVENOUS | Status: DC

## 2021-10-01 MED ORDER — FENTANYL CITRATE (PF) 100 MCG/2ML IJ SOLN
INTRAMUSCULAR | Status: DC | PRN
  Administered 2021-10-01: 25 ug via INTRAVENOUS
  Administered 2021-10-01: 50 ug via INTRAVENOUS
  Administered 2021-10-01: 25 ug via INTRAVENOUS

## 2021-10-01 MED ORDER — ACETAMINOPHEN 650 MG RE SUPP: 650.0000 mg | RECTAL | Status: DC | PRN

## 2021-10-01 MED ORDER — LIDOCAINE HCL 1 % IJ SOLN
INTRAMUSCULAR | Status: AC
  Filled 2021-10-01: qty 20

## 2021-10-01 MED ORDER — FENTANYL CITRATE (PF) 100 MCG/2ML IJ SOLN: 25.0000 ug | INTRAMUSCULAR | Status: DC | PRN

## 2021-10-01 MED ORDER — ONDANSETRON HCL 4 MG/2ML IJ SOLN
INTRAMUSCULAR | Status: DC | PRN
  Administered 2021-10-01: 4 mg via INTRAVENOUS

## 2021-10-01 MED ORDER — ASPIRIN 81 MG PO CHEW
81.0000 mg | CHEWABLE_TABLET | Freq: Every day | ORAL | Status: DC
  Administered 2021-10-02: 81 mg via ORAL
  Filled 2021-10-01: qty 1

## 2021-10-01 MED ORDER — LACTATED RINGERS IV SOLN: INTRAVENOUS | Status: DC | PRN

## 2021-10-01 MED ORDER — HEPARIN SODIUM (PORCINE) 1000 UNIT/ML IJ SOLN
INTRAMUSCULAR | Status: AC
  Filled 2021-10-01: qty 10

## 2021-10-01 MED ORDER — ASPIRIN 81 MG PO CHEW: 81.0000 mg | CHEWABLE_TABLET | Freq: Every day | ORAL | Status: DC

## 2021-10-01 MED ORDER — SODIUM CHLORIDE 0.9% IV SOLUTION: Freq: Once | INTRAVENOUS | Status: DC

## 2021-10-01 MED ORDER — ACETAMINOPHEN 325 MG PO TABS: 650.0000 mg | ORAL_TABLET | ORAL | Status: DC | PRN

## 2021-10-01 MED ORDER — NITROGLYCERIN 1 MG/10 ML FOR IR/CATH LAB
INTRA_ARTERIAL | Status: AC
  Filled 2021-10-01: qty 10

## 2021-10-01 MED ORDER — CHLORHEXIDINE GLUCONATE CLOTH 2 % EX PADS: 6.0000 | MEDICATED_PAD | Freq: Every day | CUTANEOUS | Status: DC

## 2021-10-01 MED ORDER — SODIUM CHLORIDE (PF) 0.9 % IJ SOLN
INTRAVENOUS | Status: AC | PRN
  Administered 2021-10-01: 200 ug via INTRA_ARTERIAL

## 2021-10-01 MED ORDER — SUGAMMADEX SODIUM 200 MG/2ML IV SOLN
INTRAVENOUS | Status: DC | PRN
  Administered 2021-10-01 (×2): 50 mg via INTRAVENOUS
  Administered 2021-10-01: 100 mg via INTRAVENOUS

## 2021-10-01 MED ORDER — ESMOLOL HCL 100 MG/10ML IV SOLN
INTRAVENOUS | Status: DC | PRN
  Administered 2021-10-01: 20 mg via INTRAVENOUS

## 2021-10-01 MED ORDER — TICAGRELOR 90 MG PO TABS: 90.0000 mg | ORAL_TABLET | Freq: Two times a day (BID) | ORAL | Status: DC

## 2021-10-01 MED ORDER — VERAPAMIL HCL 2.5 MG/ML IV SOLN
INTRAVENOUS | Status: AC
  Filled 2021-10-01: qty 2

## 2021-10-01 MED ORDER — TICAGRELOR 90 MG PO TABS
ORAL_TABLET | ORAL | Status: AC
  Administered 2021-10-01: 180 mg via ORAL
  Filled 2021-10-01: qty 2

## 2021-10-01 MED ORDER — DEXAMETHASONE SODIUM PHOSPHATE 10 MG/ML IJ SOLN
INTRAMUSCULAR | Status: DC | PRN
  Administered 2021-10-01: 4 mg via INTRAVENOUS

## 2021-10-01 MED ORDER — ONDANSETRON HCL 4 MG/2ML IJ SOLN: 4.0000 mg | Freq: Four times a day (QID) | INTRAMUSCULAR | Status: DC | PRN

## 2021-10-01 MED ORDER — EPHEDRINE SULFATE-NACL 50-0.9 MG/10ML-% IV SOSY
PREFILLED_SYRINGE | INTRAVENOUS | Status: DC | PRN
  Administered 2021-10-01 (×3): 2.5 mg via INTRAVENOUS

## 2021-10-01 MED ORDER — ASPIRIN 81 MG PO CHEW
CHEWABLE_TABLET | ORAL | Status: AC
  Administered 2021-10-01: 81 mg
  Filled 2021-10-01: qty 1

## 2021-10-01 NOTE — Progress Notes (Signed)
Patient assessed in PACU bay 2 post procedure. She underwent a diagnostic cerebral angiogram with endovascular aneurysm embolization with a 6 x 3 mm web device with Dr. Ladean Raya today.   Patient is alert, oriented, sitting up in bed and eating a sandwich. She denies any pain or discomfort. Right radial vascular site has TR band with 10 cc air because the site continues to ooze. Vital signs stable. Currently there are no medications infusing.   Patient will be transferred shortly to Neuro ICU bed for overnight observation. She is to take 90 mg Brilinta tonight. Tomorrow she will take 90 mg Brilinta plus 81 mg Aspirin. She will continue to take only 81 mg aspirin for one week following the procedure.   Tentative plans for discharge home tomorrow. Please call NIR with any questions.  Ashley Gross, [place] [phone] 10/01/2021, 3:18 PM

## 2021-10-01 NOTE — Sedation Documentation (Signed)
TR band applied to right wrist with 9cc air at 1136.

## 2021-10-01 NOTE — Anesthesia Procedure Notes (Signed)
Procedure Name: Intubation Date/Time: 10/01/2021 9:15 AM Performed by: Janene Harvey, CRNA Pre-anesthesia Checklist: Patient identified, Emergency Drugs available, Suction available and Patient being monitored Patient Re-evaluated:Patient Re-evaluated prior to induction Oxygen Delivery Method: Circle system utilized Preoxygenation: Pre-oxygenation with 100% oxygen Induction Type: IV induction Ventilation: Mask ventilation without difficulty Laryngoscope Size: Mac and 4 Grade View: Grade I Tube type: Oral Tube size: 7.0 mm Number of attempts: 1 Airway Equipment and Method: Stylet and Oral airway Placement Confirmation: ETT inserted through vocal cords under direct vision, positive ETCO2 and breath sounds checked- equal and bilateral Secured at: 22 cm Tube secured with: Tape Dental Injury: Teeth and Oropharynx as per pre-operative assessment

## 2021-10-01 NOTE — Procedures (Signed)
INTERVENTIONAL NEURORADIOLOGY BRIEF POSTPROCEDURE NOTE  Diagnostic cerebral angiogram and endovascular aneurysm embolization    Attending: Dr. Pedro Earls   Assistant: None.    Diagnosis: Anterior communicating artery aneurysm.    Access site: Right radial artery.    Access closure: Inflatable band.    Anesthesia: General anesthesia.    Medication used: Refer to anesthesia documentation.   Complications: None.    Estimated blood loss: Negligible.    Specimen: None.    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  Endovascular embolization performed with the use of a 6 x 3 mm web device with satisfactory aneurysm occlusion.  No evidence of thromboembolic or hemorrhagic complication.    The patient tolerated the procedure well without incident or complication and is in stable condition.

## 2021-10-01 NOTE — Sedation Documentation (Signed)
TR band had some leaking, total of 13cc air now applied to TR band. Leaking resolved.

## 2021-10-01 NOTE — Progress Notes (Signed)
Pacu RN Report to floor given  Gave report to [facility]. [phone]. Discussed surgery, meds given in OR and Pacu, VS, IV fluids given, EBL, urine output, pain and other pertinent information. Also discussed if pt had any family or friends here or belongings with them.   Discussed TR band, call to IR Dr, VSS, NIHss, all neuro intact.   Pt exits my care.

## 2021-10-01 NOTE — Anesthesia Postprocedure Evaluation (Signed)
Anesthesia Post Note  Patient: Ashley Gross  Procedure(s) Performed: EMOBILIZATION WITH ANESTHESIA     Patient location during evaluation: PACU Anesthesia Type: General Level of consciousness: awake and sedated Pain management: pain level controlled Vital Signs Assessment: post-procedure vital signs reviewed and stable Respiratory status: spontaneous breathing Cardiovascular status: stable Postop Assessment: no apparent nausea or vomiting Anesthetic complications: no   No notable events documented.  Last Vitals:  Vitals:   10/01/21 1235  Pulse: 76  Resp: 16  SpO2: 100%    Last Pain:  Vitals:   10/01/21 0658  PainSc: 0-No pain                 Huston Foley

## 2021-10-01 NOTE — H&P (Signed)
Chief Complaint: Patient was seen in consultation today for diagnostic cerebral angiogram with possible intervention of anterior communicating artery aneurysm   Referring Physician(s): Vaught, [place]   Supervising Physician: Pedro Earls  Patient Status: [facility] - Out-pt  History of Present Illness: Ashley Gross is a 70 y.o. female with a medical history significant for DM2, colitis, asthma, HTN, anemia and arteriovenous malformation. 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   She met with Dr. Karenann Cai 08/20/21 to discuss possible aneurysm management/treatment. Multiple options were discussed including observation, endovascular treatment and open neurosurgical clipping. A diagnostic cerebral angiogram for further work up was also discussed. The patient requested to have a diagnostic cerebral angiogram with possible treatment all done at the same visit.   Past Medical History:  Diagnosis Date   Abnormal liver function test 10/06/2012   Anemia, unspecified    Asthma    B12 deficiency 02/11/2019   Bleeding internal hemorrhoids 09/13/2017   Choledocholithiasis 06/29/2012   Formatting of this note might be different from the original. Dilated CBD on abdominal imaging 04/2012   Chronic diarrhea 02/16/2013   Colitis    Complication of anesthesia    asthma attack after shoulder surgery   Diabetes mellitus ([place])    type II   Esophagitis    GERD (gastroesophageal reflux disease)    Heart murmur    for years- nothing to be concerned about.   History of kidney stones    Hx of arteriovenous malformation (AVM)    Hypertension    IBS (irritable bowel syndrome)    Pernicious anemia    Personal history of colonic polyps 02/10/2013   02/08/13 colonoscopy - question of rectal varices, two 3-70m polyps in the sigmoid colon, mass at  the hepatic flexure, one 529mpolyp at the hepatic flexure, nodule at the ileocecal valve, congested mucusa in the entire colon, flattened villi mucosa in the terminal ileum.     Pneumonia    Psoriatic arthritis ([place]   s/p penicillamine, plaquenil, MTX, sulfasalazine.  s/p Embrel, Humira,.  Iritis.     Pure hypercholesterolemia    Rectal bleeding 07/15/2017    Past Surgical History:  Procedure Laterality Date   ABDOMINAL HYSTERECTOMY  1981   ovaries left in place   ANKLE SURGERY     right   BUNIONECTOMY     CHOLECYSTECTOMY  1989   COLONOSCOPY WITH PROPOFOL N/A 12/27/2017   Procedure: COLONOSCOPY WITH PROPOFOL;  Surgeon: VaLin LandsmanMD;  [facility];  Service: Gastroenterology;  Laterality: N/A;   COLONOSCOPY WITH PROPOFOL N/A 01/04/2019   Procedure: COLONOSCOPY WITH PROPOFOL;  Surgeon: VaLin LandsmanMD;  [place];  Laterality: N/A;  Diabetic - oral and injectable   COLONOSCOPY WITH PROPOFOL N/A 07/24/2021   Procedure: COLONOSCOPY WITH PROPOFOL;  Surgeon: VaLin LandsmanMD;  [place] [facility] Service: Endoscopy;  Laterality: N/A;  Diabetic   ESOPHAGOGASTRODUODENOSCOPY (EGD) WITH PROPOFOL N/A 12/27/2017   Procedure: ESOPHAGOGASTRODUODENOSCOPY (EGD) WITH PROPOFOL;  Surgeon: VaLin LandsmanMD;  [facility];  Service: Gastroenterology;  Laterality: N/A;   HAND SURGERY     right   HEEL SPUR SURGERY     NOSE SURGERY     x2   POLYPECTOMY  01/04/2019   Procedure: POLYPECTOMY;  Surgeon: VaLin LandsmanMD;  [place]  [facility];  Service: Endoscopy;;   POLYPECTOMY N/A 07/24/2021   Procedure: POLYPECTOMY;  Surgeon: Lin Landsman, MD;  Location: [place];  Service: Endoscopy;  Laterality: N/A;   SHOULDER ARTHROSCOPY WITH OPEN ROTATOR CUFF REPAIR Left 09/20/2018   Procedure: SHOULDER ARTHROSCOPY WITH OPEN ROTATOR CUFF REPAIR;  Surgeon: Corky Mull, MD;   Location[facility];  Service: Orthopedics;  Laterality: Left;   SHOULDER CLOSED REDUCTION Left 12/21/2018   Procedure: MANIPULATION UNDER ANESTHESIA WITH STEROID INJECTION;  Surgeon: Corky Mull, MD;  Location: [place];  Service: Orthopedics;  Laterality: Left;  Diabetic - insulin and oral meds   UMBILICAL HERNIA REPAIR      Allergies: Aspirin, Beta adrenergic blockers, Lisinopril, Mtx support [cobalamine combinations], Vasotec [enalapril], Verapamil, Voltaren [diclofenac sodium], Erythromycin, Indocin [indomethacin], and Jardiance [empagliflozin]  Medications: Prior to Admission medications   Medication Sig Start Date End Date Taking? Authorizing Provider  acetaminophen (TYLENOL) 325 MG tablet Take 650 mg by mouth every 6 (six) hours as needed for moderate pain.   Yes [provider]  albuterol (PROVENTIL) (2.5 MG/3ML) 0.083% nebulizer solution Take 3 mLs (2.5 mg total) by nebulization every 4 (four) hours as needed for wheezing or shortness of breath. 04/02/21  Yes McLean-Scocuzza, Nino Glow, MD  amLODipine (NORVASC) 5 MG tablet TAKE 1 TABLET(5 MG) BY MOUTH TWICE DAILY 08/26/21  Yes Einar Pheasant, MD  Cholecalciferol (VITAMIN D-3 PO) Take 2,000 Units by mouth daily.   Yes [provider]  CREON 36000-114000 units CPEP capsule Take 37,106-26,948 Units by mouth See admin instructions. 1 cap in the morning with breakfast , 2 caps at lunch and dinner, 1 cap with evening snack 08/08/21  Yes [provider]  cyclobenzaprine (FLEXERIL) 10 MG tablet TAKE 1 TABLET BY MOUTH EVERY NIGHT AS NEEDED Patient taking differently: Take 10 mg by mouth at bedtime. 09/18/21  Yes Einar Pheasant, MD  diphenhydramine-acetaminophen (TYLENOL PM) 25-500 MG TABS tablet Take 2 tablets by mouth at bedtime.   Yes [provider]  escitalopram (LEXAPRO) 10 MG tablet Take 1 tablet (10 mg total) by mouth at bedtime. 08/29/21  Yes Einar Pheasant, MD  insulin degludec (TRESIBA  FLEXTOUCH) 100 UNIT/ML FlexTouch Pen Inject 14 Units into the skin daily. Patient taking differently: Inject 16 Units into the skin daily. With biggest meal- usually dinner 08/08/21  Yes Einar Pheasant, MD  losartan (COZAAR) 100 MG tablet TAKE 1 TABLET BY MOUTH EVERY DAY 08/26/21  Yes Einar Pheasant, MD  Melatonin 10 MG TABS Take 10 mg by mouth at bedtime.   Yes [provider]  omeprazole (PRILOSEC) 20 MG capsule TAKE 1 CAPSULE(20 MG) BY MOUTH TWICE DAILY 06/27/21  Yes Einar Pheasant, MD  rosuvastatin (CRESTOR) 10 MG tablet TAKE 1 TABLET(10 MG) BY MOUTH DAILY 08/26/21  Yes Einar Pheasant, MD  budesonide-formoterol [facility]) 160-4.5 MCG/ACT inhaler Inhale 2 puffs into the lungs 2 (two) times daily. 11/13/20   Einar Pheasant, MD  ONE TOUCH ULTRA TEST test strip PATIENT NEEDS NEW METER STRIPS AND LANCETS FOR ONE TOUCH. HER INSURANCE NO LONGER COVERS ACCUCHEK. 07/31/15   Einar Pheasant, MD     Family History  Problem Relation Age of Onset   Diabetes Other    Hypertension Other    Breast cancer Neg Hx    Colon cancer Neg Hx     Social History   Socioeconomic History   Marital status: Widowed    Spouse name: Not on file   Number of children: 2   Years  of education: Not on file   Highest education level: Not on file  Occupational History   Not on file  Tobacco Use   Smoking status: Never   Smokeless tobacco: Never  Vaping Use   Vaping Use: Never used  Substance and Sexual Activity   Alcohol use: Never   Drug use: Never   Sexual activity: Not Currently  Other Topics Concern   Not on file  Social History Narrative   She is married and has two children   Social Determinants of Health   Financial Resource Strain: Medium Risk   Difficulty of Paying Living Expenses: Somewhat hard  Food Insecurity: No Food Insecurity   Worried About Charity fundraiser in the Last Year: Never true   Ran Out of Food in the Last Year: Never true  Transportation Needs: No Transportation  Needs   Lack of Transportation (Medical): No   Lack of Transportation (Non-Medical): No  Physical Activity: Not on file  Stress: No Stress Concern Present   Feeling of Stress : Only a little  Social Connections: Unknown   Frequency of Communication with Friends and Family: More than three times a week   Frequency of Social Gatherings with Friends and Family: Not on file   Attends Religious Services: Not on file   Active Member of Clubs or Organizations: Not on file   Attends Archivist Meetings: Not on file   Marital Status: Not on file    Review of Systems: A 12 point ROS discussed and pertinent positives are indicated in the HPI above.  All other systems are negative.  Review of Systems  Constitutional:  Negative for appetite change and fatigue.  Respiratory:  Negative for cough and shortness of breath.   Cardiovascular:  Negative for chest pain and leg swelling.  Gastrointestinal:  Negative for abdominal pain, diarrhea, nausea and vomiting.  Neurological:  Negative for dizziness and headaches.   Vital Signs: Ht '5\' 4"'  (1.626 m)   Wt 110 lb (49.9 kg)   BMI 18.88 kg/m   Physical Exam Constitutional:      General: She is not in acute distress. HENT:     Mouth/Throat:     Mouth: Mucous membranes are moist.     Pharynx: Oropharynx is clear.  Cardiovascular:     Rate and Rhythm: Normal rate and regular rhythm.     Pulses: Normal pulses.     Heart sounds: Normal heart sounds.  Pulmonary:     Effort: Pulmonary effort is normal.     Breath sounds: Normal breath sounds.  Abdominal:     General: Bowel sounds are normal.     Palpations: Abdomen is soft.     Tenderness: There is no abdominal tenderness.  Musculoskeletal:     Right lower leg: No edema.     Left lower leg: No edema.  Skin:    General: Skin is warm and dry.  Neurological:     Mental Status: She is alert and oriented to person, place, and time.    Imaging: No results  found.  Labs:  CBC: Recent Labs    03/21/21 1038 04/09/21 1341 10/01/21 0635  WBC 6.2 11.1* 4.4  HGB 11.4* 11.6* 10.8*  HCT 34.9* 35.5* 33.5*  PLT 277.0 397.0 256    COAGS: Recent Labs    10/01/21 0635  INR 1.0  APTT 23*    BMP: Recent Labs    11/13/20 0859 11/26/20 1413 03/21/21 1038 04/09/21 1341 04/22/21 1401 07/17/21 1635 10/01/21  5277  NA 140  --  142  --   --   --  139  K 3.3*   < > 3.4* 3.4* 3.8  --  3.7  CL 103  --  104  --   --   --  105  CO2 31  --  30  --   --   --  26  GLUCOSE 96  --  52*  --   --   --  114*  BUN 12  --  10  --   --  15 13  CALCIUM 8.6  --  8.3*  --   --   --  8.6*  CREATININE 0.97  --  0.92  --   --  1.04* 0.87  GFRNONAA  --   --   --   --   --   --  >60   < > = values in this interval not displayed.    LIVER FUNCTION TESTS: Recent Labs    11/13/20 0859 03/21/21 1038  BILITOT 0.6 0.5  AST 16 18  ALT 8 12  ALKPHOS 105 84  PROT 6.0 5.4*  ALBUMIN 4.0 3.5    TUMOR MARKERS: No results for input(s): AFPTM, CEA, CA199, CHROMGRNA in the last 8760 hours.  Assessment and Plan:  Anterior communicating artery aneurysm: [name], 70 year old female, presents today to the [facility] Neuro Interventional Radiology department for an image-guided diagnostic cerebral angiogram with possible intervention of the anterior communicating artery aneurysm. This procedure will be done under general anesthesia with planned overnight observation in the Neuro ICU.   Risks and benefits of this procedure were discussed with the patient including, but not limited to bleeding, infection, vascular injury or contrast induced renal failure.  This interventional procedure involves the use of X-rays and because of the nature of the planned procedure, it is possible that we will have prolonged use of X-ray fluoroscopy.  Potential radiation risks to you include (but are not limited to) the following: - A slightly elevated risk for cancer  several  years later in life. This risk is typically less than 0.5% percent. This risk is low in comparison to the normal incidence of human cancer, which is 33% for women and 50% for men according to the [employer]. - Radiation induced injury can include skin redness, resembling a rash, tissue breakdown / ulcers and hair loss (which can be temporary or permanent).   The likelihood of either of these occurring depends on the difficulty of the procedure and whether you are sensitive to radiation due to previous procedures, disease, or genetic conditions.   IF your procedure requires a prolonged use of radiation, you will be notified and given written instructions for further action.  It is your responsibility to monitor the irradiated area for the 2 weeks following the procedure and to notify your physician if you are concerned that you have suffered a radiation induced injury.    All of the patient's questions were answered, patient is agreeable to proceed. She has been NPO. She has received 180 mg PO Brilinta and 81 mg PO Aspirin this morning.    Consent signed and in chart.  Thank you for this interesting consult.  I greatly enjoyed meeting MIKEL PYON and look forward to participating in their care.  A copy of this report was sent to the requesting provider on this date.  Electronically Signed: Soyla Dryer, AGACNP-BC [phone] 10/01/2021, 8:08 AM   I spent a  total of  30 Minutes   in face to face in clinical consultation, greater than 50% of which was counseling/coordinating care for diagnostic cerebral angiogram with possible intervention of anterior communicating artery aneurysm.

## 2021-10-01 NOTE — Anesthesia Procedure Notes (Signed)
Arterial Line Insertion Start/End11/30/2022 8:25 AM Performed by: Janene Harvey, CRNA, CRNA  Patient location: Pre-op. Preanesthetic checklist: patient identified, IV checked and risks and benefits discussed Lidocaine 1% used for infiltration Left, radial was placed Catheter size: 20 G Hand hygiene performed , maximum sterile barriers used  and Seldinger technique used  Attempts: 2 Procedure performed without using ultrasound guided technique. Following insertion, dressing applied and Biopatch. Post procedure assessment: unchanged and normal  Patient tolerated the procedure well with no immediate complications.

## 2021-10-01 NOTE — Transfer of Care (Signed)
Immediate Anesthesia Transfer of Care Note  Patient: Ashley Gross  Procedure(s) Performed: EMOBILIZATION WITH ANESTHESIA  Patient Location: PACU  Anesthesia Type:General  Level of Consciousness: awake and patient cooperative  Airway & Oxygen Therapy: Patient Spontanous Breathing and Patient connected to face mask oxygen  Post-op Assessment: Report given to RN, Post -op Vital signs reviewed and stable, Patient moving all extremities X 4 and Patient able to stick tongue midline  Post vital signs: Reviewed and stable  Last Vitals:  Vitals Value Taken Time  BP 138/63 10/01/21 1215  Temp    Pulse 77 10/01/21 1220  Resp 22 10/01/21 1220  SpO2 100 % 10/01/21 1220  Vitals shown include unvalidated device data.  Last Pain:  Vitals:   10/01/21 0658  PainSc: 0-No pain         Complications: No notable events documented.

## 2021-10-01 NOTE — Anesthesia Preprocedure Evaluation (Signed)
Anesthesia Evaluation  Patient identified by MRN, date of birth, ID band Patient awake    Reviewed: Allergy & Precautions, NPO status , Patient's Chart, lab work & pertinent test results  Airway Mallampati: I       Dental no notable dental hx.    Pulmonary asthma ,    Pulmonary exam normal        Cardiovascular hypertension, Pt. on medications Normal cardiovascular exam     Neuro/Psych PSYCHIATRIC DISORDERS Depression    GI/Hepatic GERD  Medicated and Controlled,  Endo/Other  diabetes, Type 2, Insulin Dependent  Renal/GU      Musculoskeletal   Abdominal Normal abdominal exam  (+)   Peds  Hematology  (+) anemia ,   Anesthesia Other Findings   Reproductive/Obstetrics                             Anesthesia Physical Anesthesia Plan  ASA: 2  Anesthesia Plan: General   Post-op Pain Management:    Induction: Intravenous  PONV Risk Score and Plan: 3 and Ondansetron, Midazolam and Treatment may vary due to age or medical condition  Airway Management Planned: Oral ETT  Additional Equipment: Arterial line and None  Intra-op Plan:   Post-operative Plan: Extubation in OR  Informed Consent: I have reviewed the patients History and Physical, chart, labs and discussed the procedure including the risks, benefits and alternatives for the proposed anesthesia with the patient or authorized representative who has indicated his/her understanding and acceptance.     Dental advisory given  Plan Discussed with: CRNA  Anesthesia Plan Comments: (2 PIV/A-Line)        Anesthesia Quick Evaluation

## 2021-10-02 ENCOUNTER — Encounter (HOSPITAL_COMMUNITY): Payer: Self-pay | Admitting: Neuroradiology

## 2021-10-02 NOTE — Progress Notes (Signed)
0100: 6 cc in band, 1 cc removed despite resistance.   0200: Unable to remove any more air from TR band. Based on what has been documented there should be 5cc left. This RN verified with Tillie Rung RN and Colon Branch RN that no air was left in TR band.   TR band still in place with 0cc. RN will continue to monitor for bleeding and bruising.   Sharlet Salina, RN

## 2021-10-02 NOTE — Discharge Instructions (Signed)
Take over the counter aspirin 81 mg daily for 7 days following procedure.

## 2021-10-02 NOTE — Discharge Summary (Addendum)
Patient ID: Ashley Gross MRN: [id number] DOB/AGE: 06-17-1951 70 y.o.  Admit date: 10/01/2021 Discharge date: 10/02/2021  Supervising Physician: Pedro Earls  Patient Status: [facility] - In-pt  Admission Diagnoses: Anterior communicating artery aneurysm   Discharge Diagnoses:  Principal Problem:   Brain aneurysm   Discharged Condition: good  Hospital Course:  Ashley Gross is a 70 y.o. female with a medical history significant for DM2, colitis, asthma, HTN, anemia and arteriovenous malformation. 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    She met with Dr. Karenann Cai 08/20/21 to discuss possible aneurysm management/treatment. Multiple options were discussed including observation, endovascular treatment and open neurosurgical clipping. A diagnostic cerebral angiogram for further work up was also discussed. The patient requested to have a diagnostic cerebral angiogram with possible treatment all done at the same visit. The patient presented to the [facility] Neuro Interventional Radiology department 10/01/21 and underwent a diagnostic cerebral angiogram with endovascular aneurysm embolization with a 6x3 mm web device via right radial artery. This was done under general anesthesia with a planned overnight observation in the Neuro ICU.  The patient had an uneventful recovery following her procedure with the exception of some prolonged oozing/bruising at the right radial artery vascular site. She received a dose of Brilinta last night and a dose of Brilinta today. She does not need to continue taking this medication. She does need to take 81 mg PO aspirin for one week. The patient has ambulated, voided and is tolerating a regular diet.  The patient will follow up with Dr. Karenann Cai in one week for a right radial artery site  check. The patient knows to take 81 mg aspirin daily for one week and to limit heavy lifting/activity with her right hand until she has followed up with Dr. Karenann Cai. She knows she can call our office at any time with questions/concerns. A scheduler from our office will call her with a date/time of her follow up visit.    Consults: None  Significant Diagnostic Studies: IR Transcath/Emboliz  Result Date: 10/01/2021 INDICATION: Ashley Gross is a 70 year old female with a past medical history significant for colitis, asthma, DM, hypertension, anemia and psoriatic arthritis. 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 Patient denies smoking. There is no known family history of aneurysm. Findings were discussed with the patient in consultation August 20, 2021. The lesion was made to proceed with diagnostic cerebral angiogram and endovascular embolization of her aneurysm. EXAM: ULTRASOUND-GUIDED VASCULAR ACCESS DIAGNOSTIC CEREBRAL ANGIOGRAM 3D ROTATIONAL ANGIOGRAM ENDOVASCULAR ANEURYSM EMBOLIZATION FLAT PANEL HEAD CT COMPARISON:  MRI of the brain August 15, 2021. MEDICATIONS: Ancef 2 g IV. The antibiotic was administered within 1 hour of the procedure. ANESTHESIA/SEDATION: The procedure was performed under general anesthesia. CONTRAST:  147 mL of Omnipaque 300 milligram/mL FLUOROSCOPY TIME:  Fluoroscopy Time: 22 minutes 24 seconds (1180 mGy). COMPLICATIONS: None immediate. TECHNIQUE: Informed written consent was obtained from the patient after a thorough discussion of the procedural risks, benefits and alternatives. All questions were addressed. Maximal Sterile Barrier Technique was utilized including caps, mask, sterile gowns, sterile gloves, sterile drape, hand hygiene and skin antiseptic. A timeout was performed prior to the initiation of the procedure. Using the modified Seldinger  technique and a  micropuncture kit, access was gained to the right radial artery at the wrist and a 7 French sheath was placed. Real-time ultrasound guidance was utilized for vascular access including the acquisition of a permanent ultrasound image documenting patency of the accessed vessel. Slow intra arterial infusion of 5,000 IU heparin and 300 mcg nitroglycerin diluted in patient's own blood was performed. No significant fluctuation in patient's blood pressure seen. Then, a right radial artery angiogram was obtained via sheath side port. Next, a 5 [place] Simmons 2 glide catheter was navigated over a 0.035" Terumo Glidewire into the right subclavian artery under fluoroscopic guidance. Using road map guidance, the catheter was advanced into the right vertebral artery. Frontal and lateral angiograms of the head were obtained. The catheter was then advanced into the left common carotid artery. Frontal and lateral angiograms of the neck were obtained. Under biplane roadmap guidance, the catheter was advanced into the left internal carotid artery. Left ICA angiograms with frontal, lateral, magnified left anterior oblique and magnified lateral views of the head were obtained. The catheter was subsequently advanced into the right common carotid artery. Frontal and lateral angiograms of the neck were obtained. Using biplane roadmap guidance, the catheter was advanced into the right internal carotid artery. Frontal and lateral angiograms of the head were obtained. 3D rotational angiograms were acquired and post processed in a separate workstation under concurrent attending physician supervision. Selected images were sent to PACS. Magnified frontal and lateral angiograms of the head were obtained, centered on the anterior communicating artery. FINDINGS: 1. Normal brachial artery branching pattern seen. No significant anatomical variation. The right radial artery caliber is adequate for vascular access. 2. No aneurysm, AVM,  dural AV fistula, hemodynamically significant stenosis or other significant vascular abnormality in the posterior circulation. 3. Mild luminal irregularity of the cavernous segment of the left ICA, suggesting intracranial atherosclerotic disease without hemodynamically significant stenosis. 4. No significant atherosclerotic disease or significant stenosis of the bilateral carotid bifurcations. 5. 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 The aneurysm measures approximately 6.5 x 4.8 x 4.2 mm noting pseudo lobulation in the anterior aspect of the aneurysm. 6. Patent major veins and dural venous sinuses. PROCEDURE: The Simmons 2 glide catheter was exchanged over the wire and under biplane roadmap for a 7 [place] Rist catheter which was placed in the distal cervical segment of the right ICA. Magnified frontal and lateral angiograms of the head were obtained in the working projections. Next, a [place] EX catheter was navigated over a via 17 microcatheter and an Aristotle 14 micro guidewire into the cavernous segment of the right ICA. The microcatheter was then navigated over the wire into the right A1/ACA segment. Magnified frontal lateral angiograms of the head were obtained in the working projections. On the biplane roadmap, microcatheter was navigated over the wire into the aneurysm pouch. Then, a 6 x 3 mm web SL device was deployed within the aneurysm pouch. Magnified frontal and lateral angiograms of the head were obtained showing adequate device positioning with adequate aneurysm occlusion. The device was subsequently detached. The microcatheter was removed. Right ICA angiograms with magnified frontal and lateral views of the head confirmed stable position of the device with adequate aneurysm occlusion. The [place] EX catheter was retracted into the petrous segment of the right ICA. Frontal and lateral angiograms with views of the entire head were obtained. No evidence of thromboembolic complication  seen. Flat panel CT of the head was obtained and post processed in a separate workstation with concurrent  attending physician supervision. Selected images were sent to PACS. No evidence of hemorrhagic complication. The catheter was subsequently withdrawn. An inflatable band was placed and inflated over the right wrist access site. The vascular sheath was withdrawn and the band was slowly deflated until brisk flow was noted through the arteriotomy site. At this point, the band was reinflated with additional 3 cc of air to obtain patent hemostasis. IMPRESSION: 1. 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 2. No other aneurysm or other significant vascular abnormality identified. PLAN: Dual anti-platelet therapy for 48 hours followed by daily aspirin 81 mg for 1 week. Electronically Signed   By: Pedro Earls M.D.   On: 10/01/2021 17:03   IR 3D Independent Darreld Mclean  Result Date: 10/01/2021 INDICATION: Ashley Gross is a 70 year old female with a past medical history significant for colitis, asthma, DM, hypertension, anemia and psoriatic arthritis. 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 Patient denies smoking. There is no known family history of aneurysm. Findings were discussed with the patient in consultation August 20, 2021. The lesion was made to proceed with diagnostic cerebral angiogram and endovascular embolization of her aneurysm. EXAM: ULTRASOUND-GUIDED VASCULAR ACCESS DIAGNOSTIC CEREBRAL ANGIOGRAM 3D ROTATIONAL ANGIOGRAM ENDOVASCULAR ANEURYSM EMBOLIZATION FLAT PANEL HEAD CT COMPARISON:  MRI of the brain August 15, 2021. MEDICATIONS: Ancef 2 g IV. The antibiotic was administered within 1 hour of the procedure. ANESTHESIA/SEDATION: The procedure was performed under general anesthesia. CONTRAST:  147 mL  of Omnipaque 300 milligram/mL FLUOROSCOPY TIME:  Fluoroscopy Time: 22 minutes 24 seconds (1180 mGy). COMPLICATIONS: None immediate. TECHNIQUE: Informed written consent was obtained from the patient after a thorough discussion of the procedural risks, benefits and alternatives. All questions were addressed. Maximal Sterile Barrier Technique was utilized including caps, mask, sterile gowns, sterile gloves, sterile drape, hand hygiene and skin antiseptic. A timeout was performed prior to the initiation of the procedure. Using the modified Seldinger technique and a micropuncture kit, access was gained to the right radial artery at the wrist and a 7 French sheath was placed. Real-time ultrasound guidance was utilized for vascular access including the acquisition of a permanent ultrasound image documenting patency of the accessed vessel. Slow intra arterial infusion of 5,000 IU heparin and 300 mcg nitroglycerin diluted in patient's own blood was performed. No significant fluctuation in patient's blood pressure seen. Then, a right radial artery angiogram was obtained via sheath side port. Next, a 5 [place] Simmons 2 glide catheter was navigated over a 0.035" Terumo Glidewire into the right subclavian artery under fluoroscopic guidance. Using road map guidance, the catheter was advanced into the right vertebral artery. Frontal and lateral angiograms of the head were obtained. The catheter was then advanced into the left common carotid artery. Frontal and lateral angiograms of the neck were obtained. Under biplane roadmap guidance, the catheter was advanced into the left internal carotid artery. Left ICA angiograms with frontal, lateral, magnified left anterior oblique and magnified lateral views of the head were obtained. The catheter was subsequently advanced into the right common carotid artery. Frontal and lateral angiograms of the neck were obtained. Using biplane roadmap guidance, the catheter was advanced into the  right internal carotid artery. Frontal and lateral angiograms of the head were obtained. 3D rotational angiograms were acquired and post processed in a separate workstation under concurrent attending physician  supervision. Selected images were sent to PACS. Magnified frontal and lateral angiograms of the head were obtained, centered on the anterior communicating artery. FINDINGS: 1. Normal brachial artery branching pattern seen. No significant anatomical variation. The right radial artery caliber is adequate for vascular access. 2. No aneurysm, AVM, dural AV fistula, hemodynamically significant stenosis or other significant vascular abnormality in the posterior circulation. 3. Mild luminal irregularity of the cavernous segment of the left ICA, suggesting intracranial atherosclerotic disease without hemodynamically significant stenosis. 4. No significant atherosclerotic disease or significant stenosis of the bilateral carotid bifurcations. 5. 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 The aneurysm measures approximately 6.5 x 4.8 x 4.2 mm noting pseudo lobulation in the anterior aspect of the aneurysm. 6. Patent major veins and dural venous sinuses. PROCEDURE: The Simmons 2 glide catheter was exchanged over the wire and under biplane roadmap for a 7 [place] Rist catheter which was placed in the distal cervical segment of the right ICA. Magnified frontal and lateral angiograms of the head were obtained in the working projections. Next, a [place] EX catheter was navigated over a via 17 microcatheter and an Aristotle 14 micro guidewire into the cavernous segment of the right ICA. The microcatheter was then navigated over the wire into the right A1/ACA segment. Magnified frontal lateral angiograms of the head were obtained in the working projections. On the biplane roadmap, microcatheter was navigated over the wire into the aneurysm pouch. Then, a 6 x 3 mm web SL device was deployed within the aneurysm  pouch. Magnified frontal and lateral angiograms of the head were obtained showing adequate device positioning with adequate aneurysm occlusion. The device was subsequently detached. The microcatheter was removed. Right ICA angiograms with magnified frontal and lateral views of the head confirmed stable position of the device with adequate aneurysm occlusion. The [place] EX catheter was retracted into the petrous segment of the right ICA. Frontal and lateral angiograms with views of the entire head were obtained. No evidence of thromboembolic complication seen. Flat panel CT of the head was obtained and post processed in a separate workstation with concurrent attending physician supervision. Selected images were sent to PACS. No evidence of hemorrhagic complication. The catheter was subsequently withdrawn. An inflatable band was placed and inflated over the right wrist access site. The vascular sheath was withdrawn and the band was slowly deflated until brisk flow was noted through the arteriotomy site. At this point, the band was reinflated with additional 3 cc of air to obtain patent hemostasis. IMPRESSION: 1. 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 2. No other aneurysm or other significant vascular abnormality identified. PLAN: Dual anti-platelet therapy for 48 hours followed by daily aspirin 81 mg for 1 week. Electronically Signed   By: Pedro Earls M.D.   On: 10/01/2021 17:03   IR CT Head Ltd  Result Date: 10/01/2021 INDICATION: Ashley Gross is a 70 year old female with a past medical history significant for colitis, asthma, DM, hypertension, anemia and psoriatic arthritis. 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 Patient denies smoking. There is no known family history of aneurysm. Findings were  discussed with the patient in consultation August 20, 2021. The lesion was made to proceed with diagnostic cerebral angiogram and endovascular embolization of her aneurysm. EXAM: ULTRASOUND-GUIDED VASCULAR ACCESS DIAGNOSTIC CEREBRAL ANGIOGRAM 3D ROTATIONAL ANGIOGRAM ENDOVASCULAR  ANEURYSM EMBOLIZATION FLAT PANEL HEAD CT COMPARISON:  MRI of the brain August 15, 2021. MEDICATIONS: Ancef 2 g IV. The antibiotic was administered within 1 hour of the procedure. ANESTHESIA/SEDATION: The procedure was performed under general anesthesia. CONTRAST:  147 mL of Omnipaque 300 milligram/mL FLUOROSCOPY TIME:  Fluoroscopy Time: 22 minutes 24 seconds (1180 mGy). COMPLICATIONS: None immediate. TECHNIQUE: Informed written consent was obtained from the patient after a thorough discussion of the procedural risks, benefits and alternatives. All questions were addressed. Maximal Sterile Barrier Technique was utilized including caps, mask, sterile gowns, sterile gloves, sterile drape, hand hygiene and skin antiseptic. A timeout was performed prior to the initiation of the procedure. Using the modified Seldinger technique and a micropuncture kit, access was gained to the right radial artery at the wrist and a 7 French sheath was placed. Real-time ultrasound guidance was utilized for vascular access including the acquisition of a permanent ultrasound image documenting patency of the accessed vessel. Slow intra arterial infusion of 5,000 IU heparin and 300 mcg nitroglycerin diluted in patient's own blood was performed. No significant fluctuation in patient's blood pressure seen. Then, a right radial artery angiogram was obtained via sheath side port. Next, a 5 [place] Simmons 2 glide catheter was navigated over a 0.035" Terumo Glidewire into the right subclavian artery under fluoroscopic guidance. Using road map guidance, the catheter was advanced into the right vertebral artery. Frontal and lateral angiograms of the head were obtained. The  catheter was then advanced into the left common carotid artery. Frontal and lateral angiograms of the neck were obtained. Under biplane roadmap guidance, the catheter was advanced into the left internal carotid artery. Left ICA angiograms with frontal, lateral, magnified left anterior oblique and magnified lateral views of the head were obtained. The catheter was subsequently advanced into the right common carotid artery. Frontal and lateral angiograms of the neck were obtained. Using biplane roadmap guidance, the catheter was advanced into the right internal carotid artery. Frontal and lateral angiograms of the head were obtained. 3D rotational angiograms were acquired and post processed in a separate workstation under concurrent attending physician supervision. Selected images were sent to PACS. Magnified frontal and lateral angiograms of the head were obtained, centered on the anterior communicating artery. FINDINGS: 1. Normal brachial artery branching pattern seen. No significant anatomical variation. The right radial artery caliber is adequate for vascular access. 2. No aneurysm, AVM, dural AV fistula, hemodynamically significant stenosis or other significant vascular abnormality in the posterior circulation. 3. Mild luminal irregularity of the cavernous segment of the left ICA, suggesting intracranial atherosclerotic disease without hemodynamically significant stenosis. 4. No significant atherosclerotic disease or significant stenosis of the bilateral carotid bifurcations. 5. 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 The aneurysm measures approximately 6.5 x 4.8 x 4.2 mm noting pseudo lobulation in the anterior aspect of the aneurysm. 6. Patent major veins and dural venous sinuses. PROCEDURE: The Simmons 2 glide catheter was exchanged over the wire and under biplane roadmap for a 7 [place] Rist catheter which was placed in the distal cervical segment of the right ICA. Magnified frontal and  lateral angiograms of the head were obtained in the working projections. Next, a [place] EX catheter was navigated over a via 17 microcatheter and an Aristotle 14 micro guidewire into the cavernous segment of the right ICA. The microcatheter was then navigated over the wire into the right A1/ACA segment. Magnified frontal lateral angiograms of the head were obtained in the working projections. On the biplane roadmap, microcatheter  was navigated over the wire into the aneurysm pouch. Then, a 6 x 3 mm web SL device was deployed within the aneurysm pouch. Magnified frontal and lateral angiograms of the head were obtained showing adequate device positioning with adequate aneurysm occlusion. The device was subsequently detached. The microcatheter was removed. Right ICA angiograms with magnified frontal and lateral views of the head confirmed stable position of the device with adequate aneurysm occlusion. The [place] EX catheter was retracted into the petrous segment of the right ICA. Frontal and lateral angiograms with views of the entire head were obtained. No evidence of thromboembolic complication seen. Flat panel CT of the head was obtained and post processed in a separate workstation with concurrent attending physician supervision. Selected images were sent to PACS. No evidence of hemorrhagic complication. The catheter was subsequently withdrawn. An inflatable band was placed and inflated over the right wrist access site. The vascular sheath was withdrawn and the band was slowly deflated until brisk flow was noted through the arteriotomy site. At this point, the band was reinflated with additional 3 cc of air to obtain patent hemostasis. IMPRESSION: 1. 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 2. No other aneurysm or other significant vascular abnormality identified. PLAN: Dual anti-platelet therapy for 48 hours followed by daily aspirin 81 mg for 1  week. Electronically Signed   By: Pedro Earls M.D.   On: 10/01/2021 17:03   IR US Guide Vasc Access Right  Result Date: 10/01/2021 INDICATION: Ashley Gross is a 70 year old female with a past medical history significant for colitis, asthma, DM, hypertension, anemia and psoriatic arthritis. 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 Patient denies smoking. There is no known family history of aneurysm. Findings were discussed with the patient in consultation August 20, 2021. The lesion was made to proceed with diagnostic cerebral angiogram and endovascular embolization of her aneurysm. EXAM: ULTRASOUND-GUIDED VASCULAR ACCESS DIAGNOSTIC CEREBRAL ANGIOGRAM 3D ROTATIONAL ANGIOGRAM ENDOVASCULAR ANEURYSM EMBOLIZATION FLAT PANEL HEAD CT COMPARISON:  MRI of the brain August 15, 2021. MEDICATIONS: Ancef 2 g IV. The antibiotic was administered within 1 hour of the procedure. ANESTHESIA/SEDATION: The procedure was performed under general anesthesia. CONTRAST:  147 mL of Omnipaque 300 milligram/mL FLUOROSCOPY TIME:  Fluoroscopy Time: 22 minutes 24 seconds (1180 mGy). COMPLICATIONS: None immediate. TECHNIQUE: Informed written consent was obtained from the patient after a thorough discussion of the procedural risks, benefits and alternatives. All questions were addressed. Maximal Sterile Barrier Technique was utilized including caps, mask, sterile gowns, sterile gloves, sterile drape, hand hygiene and skin antiseptic. A timeout was performed prior to the initiation of the procedure. Using the modified Seldinger technique and a micropuncture kit, access was gained to the right radial artery at the wrist and a 7 French sheath was placed. Real-time ultrasound guidance was utilized for vascular access including the acquisition of a permanent ultrasound image documenting patency of the accessed  vessel. Slow intra arterial infusion of 5,000 IU heparin and 300 mcg nitroglycerin diluted in patient's own blood was performed. No significant fluctuation in patient's blood pressure seen. Then, a right radial artery angiogram was obtained via sheath side port. Next, a 5 [place] Simmons 2 glide catheter was navigated over a 0.035" Terumo Glidewire into the right subclavian artery under fluoroscopic guidance. Using road map guidance, the catheter was advanced into the  right vertebral artery. Frontal and lateral angiograms of the head were obtained. The catheter was then advanced into the left common carotid artery. Frontal and lateral angiograms of the neck were obtained. Under biplane roadmap guidance, the catheter was advanced into the left internal carotid artery. Left ICA angiograms with frontal, lateral, magnified left anterior oblique and magnified lateral views of the head were obtained. The catheter was subsequently advanced into the right common carotid artery. Frontal and lateral angiograms of the neck were obtained. Using biplane roadmap guidance, the catheter was advanced into the right internal carotid artery. Frontal and lateral angiograms of the head were obtained. 3D rotational angiograms were acquired and post processed in a separate workstation under concurrent attending physician supervision. Selected images were sent to PACS. Magnified frontal and lateral angiograms of the head were obtained, centered on the anterior communicating artery. FINDINGS: 1. Normal brachial artery branching pattern seen. No significant anatomical variation. The right radial artery caliber is adequate for vascular access. 2. No aneurysm, AVM, dural AV fistula, hemodynamically significant stenosis or other significant vascular abnormality in the posterior circulation. 3. Mild luminal irregularity of the cavernous segment of the left ICA, suggesting intracranial atherosclerotic disease without hemodynamically significant  stenosis. 4. No significant atherosclerotic disease or significant stenosis of the bilateral carotid bifurcations. 5. 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 The aneurysm measures approximately 6.5 x 4.8 x 4.2 mm noting pseudo lobulation in the anterior aspect of the aneurysm. 6. Patent major veins and dural venous sinuses. PROCEDURE: The Simmons 2 glide catheter was exchanged over the wire and under biplane roadmap for a 7 [place] Rist catheter which was placed in the distal cervical segment of the right ICA. Magnified frontal and lateral angiograms of the head were obtained in the working projections. Next, a [place] EX catheter was navigated over a via 17 microcatheter and an Aristotle 14 micro guidewire into the cavernous segment of the right ICA. The microcatheter was then navigated over the wire into the right A1/ACA segment. Magnified frontal lateral angiograms of the head were obtained in the working projections. On the biplane roadmap, microcatheter was navigated over the wire into the aneurysm pouch. Then, a 6 x 3 mm web SL device was deployed within the aneurysm pouch. Magnified frontal and lateral angiograms of the head were obtained showing adequate device positioning with adequate aneurysm occlusion. The device was subsequently detached. The microcatheter was removed. Right ICA angiograms with magnified frontal and lateral views of the head confirmed stable position of the device with adequate aneurysm occlusion. The [place] EX catheter was retracted into the petrous segment of the right ICA. Frontal and lateral angiograms with views of the entire head were obtained. No evidence of thromboembolic complication seen. Flat panel CT of the head was obtained and post processed in a separate workstation with concurrent attending physician supervision. Selected images were sent to PACS. No evidence of hemorrhagic complication. The catheter was subsequently withdrawn. An inflatable band was  placed and inflated over the right wrist access site. The vascular sheath was withdrawn and the band was slowly deflated until brisk flow was noted through the arteriotomy site. At this point, the band was reinflated with additional 3 cc of air to obtain patent hemostasis. IMPRESSION: 1. 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 2. No other aneurysm or other significant vascular abnormality identified. PLAN: Dual anti-platelet therapy for 48 hours followed by daily aspirin 81 mg for 1 week. Electronically Signed  By: Pedro Earls M.D.   On: 10/01/2021 17:03   IR ANGIO INTRA EXTRACRAN SEL COM CAROTID INNOMINATE UNI L MOD SED  Result Date: 10/01/2021 INDICATION: Ashley Gross is a 70 year old female with a past medical history significant for colitis, asthma, DM, hypertension, anemia and psoriatic arthritis. 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 Patient denies smoking. There is no known family history of aneurysm. Findings were discussed with the patient in consultation August 20, 2021. The lesion was made to proceed with diagnostic cerebral angiogram and endovascular embolization of her aneurysm. EXAM: ULTRASOUND-GUIDED VASCULAR ACCESS DIAGNOSTIC CEREBRAL ANGIOGRAM 3D ROTATIONAL ANGIOGRAM ENDOVASCULAR ANEURYSM EMBOLIZATION FLAT PANEL HEAD CT COMPARISON:  MRI of the brain August 15, 2021. MEDICATIONS: Ancef 2 g IV. The antibiotic was administered within 1 hour of the procedure. ANESTHESIA/SEDATION: The procedure was performed under general anesthesia. CONTRAST:  147 mL of Omnipaque 300 milligram/mL FLUOROSCOPY TIME:  Fluoroscopy Time: 22 minutes 24 seconds (1180 mGy). COMPLICATIONS: None immediate. TECHNIQUE: Informed written consent was obtained from the patient after a thorough discussion of the  procedural risks, benefits and alternatives. All questions were addressed. Maximal Sterile Barrier Technique was utilized including caps, mask, sterile gowns, sterile gloves, sterile drape, hand hygiene and skin antiseptic. A timeout was performed prior to the initiation of the procedure. Using the modified Seldinger technique and a micropuncture kit, access was gained to the right radial artery at the wrist and a 7 French sheath was placed. Real-time ultrasound guidance was utilized for vascular access including the acquisition of a permanent ultrasound image documenting patency of the accessed vessel. Slow intra arterial infusion of 5,000 IU heparin and 300 mcg nitroglycerin diluted in patient's own blood was performed. No significant fluctuation in patient's blood pressure seen. Then, a right radial artery angiogram was obtained via sheath side port. Next, a 5 [place] Simmons 2 glide catheter was navigated over a 0.035" Terumo Glidewire into the right subclavian artery under fluoroscopic guidance. Using road map guidance, the catheter was advanced into the right vertebral artery. Frontal and lateral angiograms of the head were obtained. The catheter was then advanced into the left common carotid artery. Frontal and lateral angiograms of the neck were obtained. Under biplane roadmap guidance, the catheter was advanced into the left internal carotid artery. Left ICA angiograms with frontal, lateral, magnified left anterior oblique and magnified lateral views of the head were obtained. The catheter was subsequently advanced into the right common carotid artery. Frontal and lateral angiograms of the neck were obtained. Using biplane roadmap guidance, the catheter was advanced into the right internal carotid artery. Frontal and lateral angiograms of the head were obtained. 3D rotational angiograms were acquired and post processed in a separate workstation under concurrent attending physician supervision. Selected  images were sent to PACS. Magnified frontal and lateral angiograms of the head were obtained, centered on the anterior communicating artery. FINDINGS: 1. Normal brachial artery branching pattern seen. No significant anatomical variation. The right radial artery caliber is adequate for vascular access. 2. No aneurysm, AVM, dural AV fistula, hemodynamically significant stenosis or other significant vascular abnormality in the posterior circulation. 3. Mild luminal irregularity of the cavernous segment of the left ICA, suggesting intracranial atherosclerotic disease without hemodynamically significant stenosis. 4. No significant atherosclerotic disease or significant stenosis of the bilateral carotid bifurcations. 5. 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 The aneurysm  measures approximately 6.5 x 4.8 x 4.2 mm noting pseudo lobulation in the anterior aspect of the aneurysm. 6. Patent major veins and dural venous sinuses. PROCEDURE: The Simmons 2 glide catheter was exchanged over the wire and under biplane roadmap for a 7 [place] Rist catheter which was placed in the distal cervical segment of the right ICA. Magnified frontal and lateral angiograms of the head were obtained in the working projections. Next, a [place] EX catheter was navigated over a via 17 microcatheter and an Aristotle 14 micro guidewire into the cavernous segment of the right ICA. The microcatheter was then navigated over the wire into the right A1/ACA segment. Magnified frontal lateral angiograms of the head were obtained in the working projections. On the biplane roadmap, microcatheter was navigated over the wire into the aneurysm pouch. Then, a 6 x 3 mm web SL device was deployed within the aneurysm pouch. Magnified frontal and lateral angiograms of the head were obtained showing adequate device positioning with adequate aneurysm occlusion. The device was subsequently detached. The microcatheter was removed. Right ICA angiograms  with magnified frontal and lateral views of the head confirmed stable position of the device with adequate aneurysm occlusion. The [place] EX catheter was retracted into the petrous segment of the right ICA. Frontal and lateral angiograms with views of the entire head were obtained. No evidence of thromboembolic complication seen. Flat panel CT of the head was obtained and post processed in a separate workstation with concurrent attending physician supervision. Selected images were sent to PACS. No evidence of hemorrhagic complication. The catheter was subsequently withdrawn. An inflatable band was placed and inflated over the right wrist access site. The vascular sheath was withdrawn and the band was slowly deflated until brisk flow was noted through the arteriotomy site. At this point, the band was reinflated with additional 3 cc of air to obtain patent hemostasis. IMPRESSION: 1. 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 2. No other aneurysm or other significant vascular abnormality identified. PLAN: Dual anti-platelet therapy for 48 hours followed by daily aspirin 81 mg for 1 week. Electronically Signed   By: Pedro Earls M.D.   On: 10/01/2021 17:03   IR ANGIO INTRA EXTRACRAN SEL INTERNAL CAROTID UNI R MOD SED  Result Date: 10/01/2021 INDICATION: Ashley Gross is a 70 year old female with a past medical history significant for colitis, asthma, DM, hypertension, anemia and psoriatic arthritis. 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 Patient denies smoking. There is no known family history of aneurysm. Findings were discussed with the patient in consultation August 20, 2021. The lesion was made to proceed with diagnostic cerebral angiogram and endovascular embolization of her aneurysm. EXAM:  ULTRASOUND-GUIDED VASCULAR ACCESS DIAGNOSTIC CEREBRAL ANGIOGRAM 3D ROTATIONAL ANGIOGRAM ENDOVASCULAR ANEURYSM EMBOLIZATION FLAT PANEL HEAD CT COMPARISON:  MRI of the brain August 15, 2021. MEDICATIONS: Ancef 2 g IV. The antibiotic was administered within 1 hour of the procedure. ANESTHESIA/SEDATION: The procedure was performed under general anesthesia. CONTRAST:  147 mL of Omnipaque 300 milligram/mL FLUOROSCOPY TIME:  Fluoroscopy Time: 22 minutes 24 seconds (1180 mGy). COMPLICATIONS: None immediate. TECHNIQUE: Informed written consent was obtained from the patient after a thorough discussion of the procedural risks, benefits and alternatives. All questions were addressed. Maximal Sterile Barrier Technique was utilized including caps, mask, sterile gowns, sterile gloves, sterile drape, hand hygiene  and skin antiseptic. A timeout was performed prior to the initiation of the procedure. Using the modified Seldinger technique and a micropuncture kit, access was gained to the right radial artery at the wrist and a 7 French sheath was placed. Real-time ultrasound guidance was utilized for vascular access including the acquisition of a permanent ultrasound image documenting patency of the accessed vessel. Slow intra arterial infusion of 5,000 IU heparin and 300 mcg nitroglycerin diluted in patient's own blood was performed. No significant fluctuation in patient's blood pressure seen. Then, a right radial artery angiogram was obtained via sheath side port. Next, a 5 [place] Simmons 2 glide catheter was navigated over a 0.035" Terumo Glidewire into the right subclavian artery under fluoroscopic guidance. Using road map guidance, the catheter was advanced into the right vertebral artery. Frontal and lateral angiograms of the head were obtained. The catheter was then advanced into the left common carotid artery. Frontal and lateral angiograms of the neck were obtained. Under biplane roadmap guidance, the catheter was advanced  into the left internal carotid artery. Left ICA angiograms with frontal, lateral, magnified left anterior oblique and magnified lateral views of the head were obtained. The catheter was subsequently advanced into the right common carotid artery. Frontal and lateral angiograms of the neck were obtained. Using biplane roadmap guidance, the catheter was advanced into the right internal carotid artery. Frontal and lateral angiograms of the head were obtained. 3D rotational angiograms were acquired and post processed in a separate workstation under concurrent attending physician supervision. Selected images were sent to PACS. Magnified frontal and lateral angiograms of the head were obtained, centered on the anterior communicating artery. FINDINGS: 1. Normal brachial artery branching pattern seen. No significant anatomical variation. The right radial artery caliber is adequate for vascular access. 2. No aneurysm, AVM, dural AV fistula, hemodynamically significant stenosis or other significant vascular abnormality in the posterior circulation. 3. Mild luminal irregularity of the cavernous segment of the left ICA, suggesting intracranial atherosclerotic disease without hemodynamically significant stenosis. 4. No significant atherosclerotic disease or significant stenosis of the bilateral carotid bifurcations. 5. 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 The aneurysm measures approximately 6.5 x 4.8 x 4.2 mm noting pseudo lobulation in the anterior aspect of the aneurysm. 6. Patent major veins and dural venous sinuses. PROCEDURE: The Simmons 2 glide catheter was exchanged over the wire and under biplane roadmap for a 7 [place] Rist catheter which was placed in the distal cervical segment of the right ICA. Magnified frontal and lateral angiograms of the head were obtained in the working projections. Next, a [place] EX catheter was navigated over a via 17 microcatheter and an Aristotle 14 micro guidewire into  the cavernous segment of the right ICA. The microcatheter was then navigated over the wire into the right A1/ACA segment. Magnified frontal lateral angiograms of the head were obtained in the working projections. On the biplane roadmap, microcatheter was navigated over the wire into the aneurysm pouch. Then, a 6 x 3 mm web SL device was deployed within the aneurysm pouch. Magnified frontal and lateral angiograms of the head were obtained showing adequate device positioning with adequate aneurysm occlusion. The device was subsequently detached. The microcatheter was removed. Right ICA angiograms with magnified frontal and lateral views of the head confirmed stable position of the device with adequate aneurysm occlusion. The [place] EX catheter was retracted into the petrous segment of the right ICA. Frontal and lateral angiograms with views of the entire head were obtained. No evidence  of thromboembolic complication seen. Flat panel CT of the head was obtained and post processed in a separate workstation with concurrent attending physician supervision. Selected images were sent to PACS. No evidence of hemorrhagic complication. The catheter was subsequently withdrawn. An inflatable band was placed and inflated over the right wrist access site. The vascular sheath was withdrawn and the band was slowly deflated until brisk flow was noted through the arteriotomy site. At this point, the band was reinflated with additional 3 cc of air to obtain patent hemostasis. IMPRESSION: 1. 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 2. No other aneurysm or other significant vascular abnormality identified. PLAN: Dual anti-platelet therapy for 48 hours followed by daily aspirin 81 mg for 1 week. Electronically Signed   By: Pedro Earls M.D.   On: 10/01/2021 17:03   IR ANGIO VERTEBRAL SEL VERTEBRAL BILAT MOD SED  Result Date:  10/01/2021 INDICATION: Ashley Gross is a 70 year old female with a past medical history significant for colitis, asthma, DM, hypertension, anemia and psoriatic arthritis. 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 Patient denies smoking. There is no known family history of aneurysm. Findings were discussed with the patient in consultation August 20, 2021. The lesion was made to proceed with diagnostic cerebral angiogram and endovascular embolization of her aneurysm. EXAM: ULTRASOUND-GUIDED VASCULAR ACCESS DIAGNOSTIC CEREBRAL ANGIOGRAM 3D ROTATIONAL ANGIOGRAM ENDOVASCULAR ANEURYSM EMBOLIZATION FLAT PANEL HEAD CT COMPARISON:  MRI of the brain August 15, 2021. MEDICATIONS: Ancef 2 g IV. The antibiotic was administered within 1 hour of the procedure. ANESTHESIA/SEDATION: The procedure was performed under general anesthesia. CONTRAST:  147 mL of Omnipaque 300 milligram/mL FLUOROSCOPY TIME:  Fluoroscopy Time: 22 minutes 24 seconds (1180 mGy). COMPLICATIONS: None immediate. TECHNIQUE: Informed written consent was obtained from the patient after a thorough discussion of the procedural risks, benefits and alternatives. All questions were addressed. Maximal Sterile Barrier Technique was utilized including caps, mask, sterile gowns, sterile gloves, sterile drape, hand hygiene and skin antiseptic. A timeout was performed prior to the initiation of the procedure. Using the modified Seldinger technique and a micropuncture kit, access was gained to the right radial artery at the wrist and a 7 French sheath was placed. Real-time ultrasound guidance was utilized for vascular access including the acquisition of a permanent ultrasound image documenting patency of the accessed vessel. Slow intra arterial infusion of 5,000 IU heparin and 300 mcg nitroglycerin diluted in patient's own blood was performed. No  significant fluctuation in patient's blood pressure seen. Then, a right radial artery angiogram was obtained via sheath side port. Next, a 5 [place] Simmons 2 glide catheter was navigated over a 0.035" Terumo Glidewire into the right subclavian artery under fluoroscopic guidance. Using road map guidance, the catheter was advanced into the right vertebral artery. Frontal and lateral angiograms of the head were obtained. The catheter was then advanced into the left common carotid artery. Frontal and lateral angiograms of the neck were obtained. Under biplane roadmap guidance, the catheter was advanced into the left internal carotid artery. Left ICA angiograms with frontal, lateral, magnified left anterior oblique and magnified lateral views of the head were obtained. The catheter was subsequently advanced into the right common carotid artery. Frontal and lateral angiograms of the neck were obtained. Using biplane roadmap guidance, the catheter was advanced into the right internal carotid artery.  Frontal and lateral angiograms of the head were obtained. 3D rotational angiograms were acquired and post processed in a separate workstation under concurrent attending physician supervision. Selected images were sent to PACS. Magnified frontal and lateral angiograms of the head were obtained, centered on the anterior communicating artery. FINDINGS: 1. Normal brachial artery branching pattern seen. No significant anatomical variation. The right radial artery caliber is adequate for vascular access. 2. No aneurysm, AVM, dural AV fistula, hemodynamically significant stenosis or other significant vascular abnormality in the posterior circulation. 3. Mild luminal irregularity of the cavernous segment of the left ICA, suggesting intracranial atherosclerotic disease without hemodynamically significant stenosis. 4. No significant atherosclerotic disease or significant stenosis of the bilateral carotid bifurcations. 5. Saccular  aneurysm identified at the right A1-A2/ACA junction. The aneurysm measures approximately 6.5 x 4.8 x 4.2 mm noting pseudo lobulation in the anterior aspect of the aneurysm. 6. Patent major veins and dural venous sinuses. PROCEDURE: The Simmons 2 glide catheter was exchanged over the wire and under biplane roadmap for a 7 [place] Rist catheter which was placed in the distal cervical segment of the right ICA. Magnified frontal and lateral angiograms of the head were obtained in the working projections. Next, a [place] EX catheter was navigated over a via 17 microcatheter and an Aristotle 14 micro guidewire into the cavernous segment of the right ICA. The microcatheter was then navigated over the wire into the right A1/ACA segment. Magnified frontal lateral angiograms of the head were obtained in the working projections. On the biplane roadmap, microcatheter was navigated over the wire into the aneurysm pouch. Then, a 6 x 3 mm web SL device was deployed within the aneurysm pouch. Magnified frontal and lateral angiograms of the head were obtained showing adequate device positioning with adequate aneurysm occlusion. The device was subsequently detached. The microcatheter was removed. Right ICA angiograms with magnified frontal and lateral views of the head confirmed stable position of the device with adequate aneurysm occlusion. The [place] EX catheter was retracted into the petrous segment of the right ICA. Frontal and lateral angiograms with views of the entire head were obtained. No evidence of thromboembolic complication seen. Flat panel CT of the head was obtained and post processed in a separate workstation with concurrent attending physician supervision. Selected images were sent to PACS. No evidence of hemorrhagic complication. The catheter was subsequently withdrawn. An inflatable band was placed and inflated over the right wrist access site. The vascular sheath was withdrawn and the band was slowly deflated until  brisk flow was noted through the arteriotomy site. At this point, the band was reinflated with additional 3 cc of air to obtain patent hemostasis. IMPRESSION: 1. 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 2. No other aneurysm or other significant vascular abnormality identified. PLAN: Dual anti-platelet therapy for 48 hours followed by daily aspirin 81 mg for 1 week. Electronically Signed   By: Pedro Earls M.D.   On: 10/01/2021 17:03    Treatments: Observation  Discharge Exam: Blood pressure (!) 126/56, pulse 76, temperature 98.7 F (37.1 C), temperature source Oral, resp. rate 13, height '5\' 4"'  (1.626 m), weight 121 lb 4.1 oz (55 kg), SpO2 100 %. Physical Exam Constitutional:      General: She is not in acute distress.    Appearance: Normal appearance. She is not ill-appearing.  HENT:     Mouth/Throat:     Mouth: Mucous membranes are moist.  Pharynx: Oropharynx is clear.  Cardiovascular:     Rate and Rhythm: Normal rate and regular rhythm.     Comments: Right radial artery site covered with gauze/tegaderm. Site is bruised and tender.  Pulmonary:     Effort: Pulmonary effort is normal.  Skin:    General: Skin is warm and dry.  Neurological:     General: No focal deficit present.     Mental Status: She is alert and oriented to person, place, and time. Mental status is at baseline.     Motor: No weakness.    Disposition: Discharge disposition: 01-Home or Self Care        Allergies as of 10/02/2021       Reactions   Aspirin Other (See Comments)   Increased bleeding   Beta Adrenergic Blockers    3rd degree blockage    Lisinopril    cough   Mtx Support [cobalamine Combinations] Other (See Comments)   Respiratory symptoms   Vasotec [enalapril] Cough   Verapamil Other (See Comments)   Heart block   Voltaren [diclofenac Sodium]    Pt unsure of reaction    Erythromycin Other (See  Comments)   Gi intolerance   Indocin [indomethacin]    Pt unsure of reaction    Jardiance [empagliflozin] Other (See Comments)   Recurrent yeast infections, even with washout and rechallenge        Medication List     TAKE these medications    acetaminophen 325 MG tablet Commonly known as: TYLENOL Take 650 mg by mouth every 6 (six) hours as needed for moderate pain.   albuterol (2.5 MG/3ML) 0.083% nebulizer solution Commonly known as: PROVENTIL Take 3 mLs (2.5 mg total) by nebulization every 4 (four) hours as needed for wheezing or shortness of breath.   amLODipine 5 MG tablet Commonly known as: NORVASC TAKE 1 TABLET(5 MG) BY MOUTH TWICE DAILY   budesonide-formoterol 160-4.5 MCG/ACT inhaler Commonly known as: SYMBICORT Inhale 2 puffs into the lungs 2 (two) times daily.   Creon 36000 UNITS Cpep capsule Generic drug: lipase/protease/amylase Take 75,449-20,100 Units by mouth See admin instructions. 1 cap in the morning with breakfast , 2 caps at lunch and dinner, 1 cap with evening snack   cyclobenzaprine 10 MG tablet Commonly known as: FLEXERIL TAKE 1 TABLET BY MOUTH EVERY NIGHT AS NEEDED What changed: See the new instructions.   diphenhydramine-acetaminophen 25-500 MG Tabs tablet Commonly known as: TYLENOL PM Take 2 tablets by mouth at bedtime.   escitalopram 10 MG tablet Commonly known as: Lexapro Take 1 tablet (10 mg total) by mouth at bedtime.   losartan 100 MG tablet Commonly known as: COZAAR TAKE 1 TABLET BY MOUTH EVERY DAY   Melatonin 10 MG Tabs Take 10 mg by mouth at bedtime.   omeprazole 20 MG capsule Commonly known as: PRILOSEC TAKE 1 CAPSULE(20 MG) BY MOUTH TWICE DAILY   ONE TOUCH ULTRA TEST test strip Generic drug: glucose blood PATIENT NEEDS NEW METER STRIPS AND LANCETS FOR ONE TOUCH. HER INSURANCE NO LONGER COVERS ACCUCHEK.   rosuvastatin 10 MG tablet Commonly known as: CRESTOR TAKE 1 TABLET(10 MG) BY MOUTH DAILY   Tresiba FlexTouch 100  UNIT/ML FlexTouch Pen Generic drug: insulin degludec Inject 14 Units into the skin daily. What changed:  how much to take additional instructions   VITAMIN D-3 PO Take 2,000 Units by mouth daily.        Follow-up Information     [facility].  Why: Please follow up with Dr. Karenann Cai in one week. A scheduler from our office will call you with a date/time. Please take 81 mg aspirin daily for 7 days following your procedure. Please call our office with any questions prior to your visit. Contact information: [place] [address] [phone]                  Electronically Signed: Theresa Duty, NP 10/02/2021, 11:01 AM   I have spent Less Than 30 Minutes discharging [name].

## 2021-10-09 ENCOUNTER — Telehealth (HOSPITAL_COMMUNITY): Payer: Self-pay | Admitting: Radiology

## 2021-10-09 NOTE — Telephone Encounter (Signed)
Patient called and would like to be released to drive. The puncture site has healed and is no longer bleeding, tender, or hard. Per Dr. Debbrah Alar, patient may resume her driving privileges as of today 10/09/21. JM

## 2021-10-15 ENCOUNTER — Telehealth: Payer: Self-pay

## 2021-10-15 NOTE — Telephone Encounter (Signed)
Transition Care Management Unsuccessful Follow-up Telephone Call  Date of discharge and from where:  10/02/2021  [facility]  Attempts:  1st Attempt  Reason for unsuccessful TCM follow-up call:  No answer/busy   Tomasa Rand, RN, BSN, CEN [place] Coordinator [phone]

## 2021-10-16 ENCOUNTER — Telehealth: Payer: Self-pay

## 2021-10-16 ENCOUNTER — Ambulatory Visit (INDEPENDENT_AMBULATORY_CARE_PROVIDER_SITE_OTHER): Payer: PPO | Admitting: Gastroenterology

## 2021-10-16 ENCOUNTER — Encounter: Payer: Self-pay | Admitting: Gastroenterology

## 2021-10-16 VITALS — BP 152/81 | HR 87 | Temp 97.4°F | Ht 64.0 in | Wt 114.0 lb

## 2021-10-16 DIAGNOSIS — K8681 Exocrine pancreatic insufficiency: Secondary | ICD-10-CM | POA: Diagnosis not present

## 2021-10-16 DIAGNOSIS — D649 Anemia, unspecified: Secondary | ICD-10-CM | POA: Diagnosis not present

## 2021-10-16 DIAGNOSIS — R1909 Other intra-abdominal and pelvic swelling, mass and lump: Secondary | ICD-10-CM

## 2021-10-16 NOTE — Telephone Encounter (Signed)
Ultrasound scheduled for dec 23 at 9 at [place] after midnight before ultrasound called patient she is happy with time and dat of appointment

## 2021-10-16 NOTE — Progress Notes (Signed)
Cephas Darby, MD [address]  [place]  [place], [place] [address]  Main: [phone]  Fax: [phone]    Gastroenterology Consultation  Referring Provider:     Einar Pheasant, MD Primary Care Physician:  Einar Pheasant, MD Primary Gastroenterologist:  Dr. Cephas Darby Reason for Consultation:   Unexplained weight loss, loose stools, inflammatory polyposis of the colon        HPI:   Ashley Gross is a 70 y.o. white female referred by Dr. Einar Pheasant, MD  for consultation & management of chronic diarrhea and rectal bleeding. She has known h/o moderate exocrine EPI, IBS-D, inflammatory polyps in colon was originally seeing Dr Redmond Pulling at [facility] until 2014. She is referred here due to change in her insurance. Has not seen Dr Redmond Pulling since 2014. She had extensive work up including EGD/colonoscopy/VCE and unremarkable biopsies. TTG IgA negative, stool WBCs negative, infectious work up negative, normal serum gastrin. Pancreatic elastase levels were 149 in 01/2013. She was on creon in the past which helped, stopped several years ago. Other medications she tried were cholestyramine, imodium, levsin, dicyclomine 20mg .  She had cholecystectomy several years ago. She had MRI pancreas protocol in 05/2013 which showed normal pancreas  She reports several years h/o chronic non-bloody diarrhea anywhere from 5to10BMs/day or upto 20/day during exacerbations. Denies nocturnal diarrhea or leakage or incontinence. She has been experiencing rectal bleeding since Feb or march 2018, once every 1-2 weeks. Currently, experiencing rectal bleeding every day since 3 months. Seeing blood mixed with stools and sometimes only with mucus. She showed pictures of these episodes on her phone. Her BMs are always loose, 5-6 times/day or even more. She does have severe abdominal cramps a/w diarrhea and rectal bleeding. She lost weight in the past, stable in last 1-2 yrs. She denies n/v/f/c. Her diabetes is  controlled. She can't tolerate most of the foods, has BM immediately after eating. She denies bloating. She has not tried low-FODMAPs diet She had symptomatic hemorrhoids in the past, previously itchy, painful. She denies these symptoms at present other than internal hemorrhoids. She takes PPI as needed only. She denies tobacco, ETOH, NSAID use She does not have anemia  She has h/o psoriatic arthritis, previously on remicade (developed severe myalgias), plaquenil, pencillamine, humira, embrel. Off therapy since 04/2013 She also underwent outpatient hemorrhoid ligation of all 3 hemorrhoids.  She does not have pancreatic insufficiency based on her pancreatic fecal elastase levels, therefore I stopped her pancreatic enzymes.  Follow-up visit 07/17/2021 Patient is here for follow-up of unexplained weight loss and to discuss about colonoscopy for history of inflammatory polyposis which has led to iron deficiency anemia and chronic diarrhea.  There is no evidence of underlying malignancy or inflammatory bowel disease.Marland Kitchen  Her last colonoscopy in 2020, a large inflammatory polyp was completely resected.  Patient has not seen me for last 2 years.  Unfortunately, her husband passed away in 12/12/2020, since then patient has been grieving and has lost a significant amount of weight.  Patient weighed 157 pounds in 12-Dec-2020, current weight is 121 pounds.  She thinks her weight loss is mostly from demise of her husband.  Her weight has been stable for last 4 weeks.  She reports that her appetite is okay and she does have early satiety.  Her 57 year old granddaughter lives with her currently.  She just joined the program to cope up with her husband's loss, she is also started on Ozempic for her diabetes.  Her hemoglobin  A1c is 5.4.  Normal TSH.  Mild anemia, no evidence of iron, B12 or folate deficiency  Follow-up visit 10/16/2021 Patient is here for follow-up of weight loss and diarrhea.  Patient is diagnosed with  exocrine pancreatic insufficiency based on pancreatic fecal elastase levels less than 100.  I started her on Zenpep 2 capsules with each meal and 1 with snack.  Patient lost weight from 121 pounds to 114 pounds since last visit.  Patient reports that she is able to have 2 small meals daily.  Continues to take Creon.  She reports that her bowel movements are every other day.  She also notices swelling in her left lower quadrant which after a meal gets uncomfortable.  She does report streaks of blood mixed with mucus intermittently.  She has a therapist to cope up with grief reaction from loss of her husband, it has been almost a year in December. She states that she goes out with her friends, participates in church activities.  GI Procedures:  Colonoscopy 07/24/2021 - The examined portion of the ileum was normal. - Three 5 to 7 mm polyps in the transverse colon, in the ascending colon and in the cecum, removed with a cold snare. Resected and retrieved. - Normal mucosa in the entire examined colon. Biopsied. - Polyposis at the recto-sigmoid colon, in the sigmoid colon and in the ascending colon. DIAGNOSIS:  A.  COLON POLYP, CECUM; COLD SNARE:  - INFLAMMATORY POLYP.  - NEGATIVE FOR DYSPLASIA AND MALIGNANCY.   B.  COLON POLYP, ASCENDING; COLD SNARE:  - INFLAMMATORY POLYP.  - NEGATIVE FOR DYSPLASIA AND MALIGNANCY.   C.  COLON; RANDOM COLD BIOPSY:  - COLONIC MUCOSA WITH INTACT CRYPT ARCHITECTURE.  - NEGATIVE FOR MICROSCOPIC COLITIS, DYSPLASIA, AND MALIGNANCY.   D.  COLON POLYP, TRANSVERSE; COLD SNARE:  - FECAL FRAGMENTS; NO TISSUE IS IDENTIFIED.   Colonoscopy 01/04/2019 - Hemorrhoids found on perianal exam. - The examined portion of the ileum was normal. - Multiple 10 to 30 mm, recently bleeding polyps in the sigmoid colon and in the descending colon, removed with a hot snare. Polyp resection was incomplete, and the resected tissue was partially retrieved.     Colonoscopy  04/06/2007 Indications:  last colonoscopy 2001, anemia. Impression: -Congested mucosa in the entire colon. -Nodules in the proximal ascending colon, biopsied. -Nodule in the distal ascending colon, biopsied. -Congested erythematous and friable (with contact bleeding) mucosa in the  ascending colon and in the cecum. . "CECUM" (ENDOSCOPIC BIOPSY)     COLONIC MUCOSA WITH NO PATHOLOGIC DIAGNOSIS.     NO ACTIVE OR CHRONIC COLITIS IS SEEN.     NO EVIDENCE OF LYMPHOCYTIC OR COLLAGENOUS COLITIS IS SEEN. B. "RIGHT COLON NODULES" (ENDOSCOPIC BIOPSY):     INFLAMMATORY POLYPS. C. "RECTOSIGMOID COLON" (ENDOSCOPIC BIOPSY):     COLONIC MUCOSA WITH NO PATHOLOGIC DIAGNOSIS.     NO ACTIVE OR CHRONIC COLITIS IS SEEN.     NO EVIDENCE OF LYMPHOCYTIC OR COLLAGENOUS COLITIS IS SEEN. D. "DISTAL ASCENDING COLON" (ENDOSCOPIC BIOPSY):     INFLAMMATORY POLYPS. ------------------------------------------------------------------------------------------------- Colonoscopy 2014  A. "ILEUM" (ENDOSCOPIC BIOPSY):     SMALL INTESTINAL MUCOSA WITH NO PATHOLOGIC DIAGNOSIS.     NO ACTIVE OR CHRONIC ENTERITIS IS SEEN.     NO GRANULOMATOUS INFLAMMATION IS SEEN. B. "COLON, ILEOCECAL VALVE NODULE" (BIOPSY):     COLONIC MUCOSA, WITH REACTIVE CHANGES.     NO ADENOMATOUS MUCOSA IS IDENTIFIED. C. "RIGHT COLON" (ENDOSCOPIC BIOPSY):     COLONIC MUCOSA WITH NO PATHOLOGIC  DIAGNOSIS.     NO ACTIVE OR CHRONIC COLITIS IS SEEN.     NO EVIDENCE OF LYMPHOCYTIC OR COLLAGENOUS COLITIS IS SEEN. D. "HEPATIC FLEXURE MASS" (ENDOSCOPIC BIOPSY):     INFLAMMATORY POLYP.     NO ADENOMATOUS MUCOSA IS SEEN. E. "TRANSVERSE COLON" (ENDOSCOPIC BIOPSY):     COLONIC MUCOSA WITH NO PATHOLOGIC DIAGNOSIS.     NO ACTIVE OR CHRONIC COLITIS IS SEEN.     NO EVIDENCE OF LYMPHOCYTIC OR COLLAGENOUS COLITIS IS SEEN. F. "LEFT COLON" (ENDOSCOPIC BIOPSY):     COLONIC MUCOSA WITH NO PATHOLOGIC DIAGNOSIS.     NO ACTIVE OR CHRONIC COLITIS IS SEEN.     NO EVIDENCE  OF LYMPHOCYTIC OR COLLAGENOUS COLITIS IS SEEN.     A MINUTE HYPERPLASTIC POLYP IS ALSO PRESENT. G. "LEFT COLON POLYP(S)" (ENDOSCOPIC POLYPECTOMY):     TUBULAR ADENOMA.     NO HIGH GRADE DYSPLASIA OR CARCINOMA IS SEEN.  Colonoscopy 12/27/2017 - The examined portion of the ileum was normal. Biopsied. - One 12 mm polyp in the ascending colon, removed with a hot snare. Resected and retrieved. - One 40 mm polyp in the transverse colon, removed with a hot snare. Incomplete resection. Resected tissue retrieved. - A few 8 to 15 mm, non-bleeding polyps in the sigmoid colon. Resection not attempted. - The distal rectum and anal verge are normal on retroflexion view.   Upper endoscopy 12/27/2017 - Normal duodenal bulb and second portion of the duodenum. Biopsied. - Erythematous mucosa in the gastric body and antrum. - Normal cardia, gastric fundus and incisura. Biopsied. - Normal gastroesophageal junction and esophagus.  DIAGNOSIS:  A. DUODENUM; COLD BIOPSY:  - DUODENAL MUCOSA WITH PRESERVED VILLOUS ARCHITECTURE AND BRUNNER'S  GLAND HYPERPLASIA.  - NEGATIVE FOR INTRA-EPITHELIAL LYMPHOCYTOSIS, DYSPLASIA AND MALIGNANCY.   B.  STOMACH; COLD BIOPSY:  - ANTRAL AND OXYNTIC MUCOSA WITH MILD CHRONIC GASTRITIS.  - NEGATIVE FOR H. PYLORI, DYSPLASIA AND MALIGNANCY.   C.  TERMINAL ILEUM; COLD BIOPSY:  - SMALL BOWEL MUCOSA WITH INTACT VILLOUS ARCHITECTURE.  - NEGATIVE FOR INTRAEPITHELIAL LYMPHOCYTOSIS, DYSPLASIA AND MALIGNANCY.   D.  RANDOM RIGHT COLON; COLD BIOPSY:  - COLONIC MUCOSA NEGATIVE FOR MICROSCOPIC COLITIS, DYSPLASIA AND  MALIGNANCY.    E.  COLON POLYP, ASCENDING; HOT SNARE:  - INFLAMMATORY-TYPE POLYP.   F.  COLON POLYP, TRANSVERSE; HOT SNARE:  - INFLAMMATORY-TYPE POLYP.   G.  RANDOM TRANSVERSE COLON; COLD BIOPSY:  - COLONIC MUCOSA NEGATIVE FOR MICROSCOPIC COLITIS, DYSPLASIA AND  MALIGNANCY.   Past Medical History:  Diagnosis Date   Abnormal liver function test 10/06/2012    Anemia, unspecified    Asthma    B12 deficiency 02/11/2019   Bleeding internal hemorrhoids 09/13/2017   Choledocholithiasis 06/29/2012   Formatting of this note might be different from the original. Dilated CBD on abdominal imaging 04/2012   Chronic diarrhea 02/16/2013   Colitis    Complication of anesthesia    asthma attack after shoulder surgery   Diabetes mellitus ([place])    type II   Esophagitis    GERD (gastroesophageal reflux disease)    Heart murmur    for years- nothing to be concerned about.   History of kidney stones    Hx of arteriovenous malformation (AVM)    Hypertension    IBS (irritable bowel syndrome)    Pernicious anemia    Personal history of colonic polyps 02/10/2013   02/08/13 colonoscopy - question of rectal varices, two 3-55mm polyps in the sigmoid colon, mass at the hepatic  flexure, one 73mm polyp at the hepatic flexure, nodule at the ileocecal valve, congested mucusa in the entire colon, flattened villi mucosa in the terminal ileum.     Pneumonia    Psoriatic arthritis ([place])    s/p penicillamine, plaquenil, MTX, sulfasalazine.  s/p Embrel, Humira,.  Iritis.     Pure hypercholesterolemia    Rectal bleeding 07/15/2017    Past Surgical History:  Procedure Laterality Date   ABDOMINAL HYSTERECTOMY  1981   ovaries left in place   ANKLE SURGERY     right   BUNIONECTOMY     CHOLECYSTECTOMY  1989   COLONOSCOPY WITH PROPOFOL N/A 12/27/2017   Procedure: COLONOSCOPY WITH PROPOFOL;  Surgeon: Lin Landsman, MD;  [facility] ENDOSCOPY;  Service: Gastroenterology;  Laterality: N/A;   COLONOSCOPY WITH PROPOFOL N/A 01/04/2019   Procedure: COLONOSCOPY WITH PROPOFOL;  Surgeon: Lin Landsman, MD;  Location: [facility];  Service: Endoscopy;  Laterality: N/A;  Diabetic - oral and injectable   COLONOSCOPY WITH PROPOFOL N/A 07/24/2021   Procedure: COLONOSCOPY WITH PROPOFOL;  Surgeon: Lin Landsman, MD;  Location: [facility];  Service: Endoscopy;   Laterality: N/A;  Diabetic   ESOPHAGOGASTRODUODENOSCOPY (EGD) WITH PROPOFOL N/A 12/27/2017   Procedure: ESOPHAGOGASTRODUODENOSCOPY (EGD) WITH PROPOFOL;  Surgeon: Lin Landsman, MD;  Location: [facility] ENDOSCOPY;  Service: Gastroenterology;  Laterality: N/A;   HAND SURGERY     right   HEEL SPUR SURGERY     IR 3D INDEPENDENT WKST  10/01/2021   IR ANGIO INTRA EXTRACRAN SEL COM CAROTID INNOMINATE UNI L MOD SED  10/01/2021   IR ANGIO INTRA EXTRACRAN SEL INTERNAL CAROTID UNI R MOD SED  10/01/2021   IR ANGIO VERTEBRAL SEL VERTEBRAL BILAT MOD SED  10/01/2021   IR CT HEAD LTD  10/01/2021   IR TRANSCATH/EMBOLIZ  10/01/2021   IR US GUIDE VASC ACCESS RIGHT  10/01/2021   NOSE SURGERY     x2   POLYPECTOMY  01/04/2019   Procedure: POLYPECTOMY;  Surgeon: Lin Landsman, MD;  Location: [facility];  Service: Endoscopy;;   POLYPECTOMY N/A 07/24/2021   Procedure: POLYPECTOMY;  Surgeon: Lin Landsman, MD;  Location: [place] [facility];  Service: Endoscopy;  Laterality: N/A;   RADIOLOGY WITH ANESTHESIA N/A 10/01/2021   Procedure: Starr Sinclair WITH ANESTHESIA;  Surgeon: Pedro Earls, MD;  Location: [facility];  Service: Radiology;  Laterality: N/A;   SHOULDER ARTHROSCOPY WITH OPEN ROTATOR CUFF REPAIR Left 09/20/2018   Procedure: SHOULDER ARTHROSCOPY WITH OPEN ROTATOR CUFF REPAIR;  Surgeon: Corky Mull, MD;  Location: [facility];  Service: Orthopedics;  Laterality: Left;   SHOULDER CLOSED REDUCTION Left 12/21/2018   Procedure: MANIPULATION UNDER ANESTHESIA WITH STEROID INJECTION;  Surgeon: Corky Mull, MD;  Location: [facility];  Service: Orthopedics;  Laterality: Left;  Diabetic - insulin and oral meds   UMBILICAL HERNIA REPAIR       Current Outpatient Medications:    acetaminophen (TYLENOL) 325 MG tablet, Take 650 mg by mouth every 6 (six) hours as needed for moderate pain., Disp: , Rfl:    albuterol (PROVENTIL) (2.5 MG/3ML) 0.083% nebulizer solution, Take 3 mLs  (2.5 mg total) by nebulization every 4 (four) hours as needed for wheezing or shortness of breath., Disp: 360 mL, Rfl: 12   amLODipine (NORVASC) 5 MG tablet, TAKE 1 TABLET(5 MG) BY MOUTH TWICE DAILY, Disp: 180 tablet, Rfl: 1   budesonide-formoterol (SYMBICORT) 160-4.5 MCG/ACT inhaler, Inhale 2 puffs into the lungs 2 (two) times  daily., Disp: 3 each, Rfl: 3   Cholecalciferol (VITAMIN D-3 PO), Take 2,000 Units by mouth daily., Disp: , Rfl:    CREON 36000-114000 units CPEP capsule, Take 36,000-72,000 Units by mouth See admin instructions. 1 cap in the morning with breakfast , 2 caps at lunch and dinner, 1 cap with evening snack, Disp: , Rfl:    cyclobenzaprine (FLEXERIL) 10 MG tablet, TAKE 1 TABLET BY MOUTH EVERY NIGHT AS NEEDED (Patient taking differently: Take 10 mg by mouth at bedtime.), Disp: 30 tablet, Rfl: 0   diphenhydramine-acetaminophen (TYLENOL PM) 25-500 MG TABS tablet, Take 2 tablets by mouth at bedtime., Disp: , Rfl:    insulin degludec (TRESIBA FLEXTOUCH) 100 UNIT/ML FlexTouch Pen, Inject 14 Units into the skin daily. (Patient taking differently: Inject 16 Units into the skin daily. With biggest meal- usually dinner), Disp: 12 mL, Rfl: 2   losartan (COZAAR) 100 MG tablet, TAKE 1 TABLET BY MOUTH EVERY DAY, Disp: 90 tablet, Rfl: 1   Melatonin 10 MG TABS, Take 10 mg by mouth at bedtime., Disp: , Rfl:    omeprazole (PRILOSEC) 20 MG capsule, TAKE 1 CAPSULE(20 MG) BY MOUTH TWICE DAILY, Disp: 180 capsule, Rfl: 3   ONE TOUCH ULTRA TEST test strip, PATIENT NEEDS NEW METER STRIPS AND LANCETS FOR ONE TOUCH. HER INSURANCE NO LONGER COVERS ACCUCHEK., Disp: 100 each, Rfl: PRN   rosuvastatin (CRESTOR) 10 MG tablet, TAKE 1 TABLET(10 MG) BY MOUTH DAILY, Disp: 90 tablet, Rfl: 1   escitalopram (LEXAPRO) 10 MG tablet, Take 1 tablet (10 mg total) by mouth at bedtime., Disp: 90 tablet, Rfl: 1   Family History  Problem Relation Age of Onset   Diabetes Other    Hypertension Other    Breast cancer Neg Hx     Colon cancer Neg Hx      Social History   Tobacco Use   Smoking status: Never   Smokeless tobacco: Never  Vaping Use   Vaping Use: Never used  Substance Use Topics   Alcohol use: Never   Drug use: Never    Allergies as of 10/16/2021 - Review Complete 10/16/2021  Allergen Reaction Noted   Aspirin Other (See Comments) 10/06/2012   Beta adrenergic blockers  10/06/2012   Lisinopril  12/21/2018   Mtx support [cobalamine combinations] Other (See Comments) 10/06/2012   Vasotec [enalapril] Cough 10/06/2012   Verapamil Other (See Comments) 10/06/2012   Voltaren [diclofenac sodium]  10/06/2012   Erythromycin Other (See Comments) 10/06/2012   Indocin [indomethacin]  10/06/2012   Jardiance [empagliflozin] Other (See Comments) 02/26/2020    Review of Systems:    All systems reviewed and negative except where noted in HPI.   Physical Exam:  BP (!) 152/81 (BP Location: Left Arm, Patient Position: Sitting, Cuff Size: Normal)    Pulse 87    Temp (!) 97.4 F (36.3 C) (Temporal)    Ht 5\' 4"  (1.626 m)    Wt 114 lb (51.7 kg)    BMI 19.57 kg/m  No LMP recorded. Patient has had a hysterectomy.  General:   Alert, thin built, moderately nourished, pleasant and cooperative in NAD Head:  Normocephalic and atraumatic. Eyes:  Sclera clear, no icterus.   Conjunctiva pink. Ears:  Normal auditory acuity. Nose:  No deformity, discharge, or lesions. Mouth:  No deformity or lesions,oropharynx pink & moist. Neck:  Supple; no masses or thyromegaly. Lungs:  Respirations even and unlabored.  Clear throughout to auscultation.   No wheezes, crackles, or rhonchi. No acute distress. Heart:  Regular rate and rhythm; no murmurs, clicks, rubs, or gallops. Abdomen:  Normal bowel sounds.  Obese, Soft, non-tender and localized swelling in the left lower quadrant/left inguinal area, likely inguinal hernia, hepatosplenomegaly.  No guarding or rebound tenderness.   Rectal: Not done today Msk:  Symmetrical without gross  deformities. Good, equal movement & strength bilaterally. Pulses:  Normal pulses noted. Extremities:  No clubbing or edema.  No cyanosis. Neurologic:  Alert and oriented x3;  grossly normal neurologically. Skin:  Intact without significant lesions or rashes. No jaundice. Psych:  Alert and cooperative. Normal mood and affect.  Imaging Studies: Reviewed  Assessment and Plan:   Ashley Gross is a 70 y.o. white female with h/o psoriatic arthritis, chronic diarrhea, inflammatory polyps in colon presents for f/u of rectal bleeding and chronic diarrhea. She underwent ligation of RA, RP. LL internal hemorrhoids. Had extensive workup in the past which is negative for gastrinoma, IBD, microscopic colitis, celiac disease, chronic infections, EPI.  Rectal bleeding has resolved after resection of large inflammatory polyps.  Patient is no longer iron deficient  Inflammatory polyposis of the colon are inflammatory Polyposis is a rare phenomenon which can lead to rectal bleeding and chronic diarrhea.  This may or may not be associated with inflammatory bowel disease.  Her colon biopsies were negative for inflammatory bowel disease.  Few case reports have been mentioned in the literature that in addition to removal of the large polyps, there might be a role for immunosuppressive therapy such as Biologics.  Will defer to Biologics at this time  Patient is now here for follow-up of unexplained weight loss and new diagnosis of severe exocrine pancreatic insufficiency Fecal elastase level 64, recheck levels Continue Zenpep, based on her weight, decrease Zenpep to 1 capsule with each meal CT abdomen and pelvis with contrast revealed diffuse edema of the large bowel secondary to inflammatory polyposis Encouraged high-protein diet, protein supplements and high calorie diet encouraged her to maintain weight log.  If she continues to lose weight will refer to oncology for further evaluation Ozempic has been  discontinued since her diabetes is well controlled  Left groin swelling Recommend ultrasound  Normocytic anemia Check iron panel  Follow up in 3-4 months   Cephas Darby, MD

## 2021-10-17 ENCOUNTER — Telehealth: Payer: PPO

## 2021-10-17 ENCOUNTER — Other Ambulatory Visit: Payer: Self-pay

## 2021-10-17 ENCOUNTER — Telehealth: Payer: Self-pay

## 2021-10-17 ENCOUNTER — Other Ambulatory Visit (INDEPENDENT_AMBULATORY_CARE_PROVIDER_SITE_OTHER): Payer: PPO

## 2021-10-17 ENCOUNTER — Telehealth: Payer: Self-pay | Admitting: Pharmacist

## 2021-10-17 DIAGNOSIS — Z794 Long term (current) use of insulin: Secondary | ICD-10-CM | POA: Diagnosis not present

## 2021-10-17 DIAGNOSIS — I10 Essential (primary) hypertension: Secondary | ICD-10-CM | POA: Diagnosis not present

## 2021-10-17 DIAGNOSIS — E118 Type 2 diabetes mellitus with unspecified complications: Secondary | ICD-10-CM | POA: Diagnosis not present

## 2021-10-17 DIAGNOSIS — K8681 Exocrine pancreatic insufficiency: Secondary | ICD-10-CM | POA: Diagnosis not present

## 2021-10-17 LAB — CBC WITH DIFFERENTIAL/PLATELET
Basophils Absolute: 0.1 10*3/uL (ref 0.0–0.1)
Basophils Relative: 1.3 % (ref 0.0–3.0)
Eosinophils Absolute: 0.1 10*3/uL (ref 0.0–0.7)
Eosinophils Relative: 1.2 % (ref 0.0–5.0)
HCT: 32.2 % — ABNORMAL LOW (ref 36.0–46.0)
Hemoglobin: 10.5 g/dL — ABNORMAL LOW (ref 12.0–15.0)
Lymphocytes Relative: 33.5 % (ref 12.0–46.0)
Lymphs Abs: 1.6 10*3/uL (ref 0.7–4.0)
MCHC: 32.7 g/dL (ref 30.0–36.0)
MCV: 88.8 fl (ref 78.0–100.0)
Monocytes Absolute: 0.3 10*3/uL (ref 0.1–1.0)
Monocytes Relative: 6.7 % (ref 3.0–12.0)
Neutro Abs: 2.7 10*3/uL (ref 1.4–7.7)
Neutrophils Relative %: 57.3 % (ref 43.0–77.0)
Platelets: 290 10*3/uL (ref 150.0–400.0)
RBC: 3.63 Mil/uL — ABNORMAL LOW (ref 3.87–5.11)
RDW: 15.3 % (ref 11.5–15.5)
WBC: 4.8 10*3/uL (ref 4.0–10.5)

## 2021-10-17 LAB — HEPATIC FUNCTION PANEL
ALT: 11 U/L (ref 0–35)
AST: 17 U/L (ref 0–37)
Albumin: 3.6 g/dL (ref 3.5–5.2)
Alkaline Phosphatase: 74 U/L (ref 39–117)
Bilirubin, Direct: 0.1 mg/dL (ref 0.0–0.3)
Total Bilirubin: 0.4 mg/dL (ref 0.2–1.2)
Total Protein: 5.5 g/dL — ABNORMAL LOW (ref 6.0–8.3)

## 2021-10-17 LAB — LIPID PANEL
Cholesterol: 135 mg/dL (ref 0–200)
HDL: 53.8 mg/dL (ref >39.00)
LDL Cholesterol: 63 mg/dL (ref 0–99)
NonHDL: 81.66
Total CHOL/HDL Ratio: 3
Triglycerides: 91 mg/dL (ref 0.0–149.0)
VLDL: 18.2 mg/dL (ref 0.0–40.0)

## 2021-10-17 LAB — HEMOGLOBIN A1C: Hgb A1c MFr Bld: 5.4 % (ref 4.6–6.5)

## 2021-10-17 LAB — BASIC METABOLIC PANEL
BUN: 12 mg/dL (ref 6–23)
CO2: 29 mEq/L (ref 19–32)
Calcium: 8.6 mg/dL (ref 8.4–10.5)
Chloride: 105 mEq/L (ref 96–112)
Creatinine, Ser: 0.96 mg/dL (ref 0.40–1.20)
GFR: 59.85 mL/min — ABNORMAL LOW (ref >60.00)
Glucose, Bld: 118 mg/dL — ABNORMAL HIGH (ref 70–99)
Potassium: 3.6 mEq/L (ref 3.5–5.1)
Sodium: 141 mEq/L (ref 135–145)

## 2021-10-17 LAB — TSH: TSH: 3.11 u[IU]/mL (ref 0.35–5.50)

## 2021-10-17 NOTE — Telephone Encounter (Signed)
Transition Care Management Follow-up Telephone Call Date of discharge and from where: 10/02/2021  Ashley Gross How have you been since you were released from the hospital? " Doing well" Any questions or concerns? No  Items Reviewed: Did the pt receive and understand the discharge instructions provided? Yes  Medications obtained and verified? Yes  Other? No  Any new allergies since your discharge? No  Dietary orders reviewed? No Do you have support at home? Yes   Home Care and Equipment/Supplies: Were home health services ordered? not applicable If so, what is the name of the agency?  Has the agency set up a time to come to the patient's home?  Were any new equipment or medical supplies ordered?  What is the name of the medical supply agency?  Were you able to get the supplies/equipment?  Do you have any questions related to the use of the equipment or supplies? No  Functional Questionnaire: (I = Independent and D = Dependent) ADLs: I  uses caution when bending over   Eating- I  Maintaining continence- I  Transferring/Ambulation- I  Managing Meds- I  Follow up appointments reviewed:  PCP Hospital f/u appt confirmed? Yes  Scheduled to see PCP on 10/21/2021 Specialist Hospital f/u appt confirmed? No   Are transportation arrangements needed? No  If their condition worsens, is the pt aware to call PCP or go to the Emergency Dept.? Yes Was the patient provided with contact information for the PCP's office or ED? Yes Was to pt encouraged to call back with questions or concerns? Yes  Tomasa Rand, RN, BSN, CEN [name] [name] [phone]

## 2021-10-17 NOTE — Telephone Encounter (Signed)
°  Chronic Care Management   Note  10/17/2021 Name: Ashley Gross MRN: [id number] DOB: [birth year]   Attempted to contact patient for scheduled appointment for medication management support. Left HIPAA compliant message for patient to return my call at their convenience.    Plan: - If I do not hear back from the patient by end of business today, will collaborate with Care Guide to outreach to schedule follow up with me  [name] Darnelle Maffucci, PharmD, [facility], [place] Pharmacist [employer] [facility] [employer] [phone]

## 2021-10-18 LAB — IRON,TIBC AND FERRITIN PANEL
Ferritin: 19 ng/mL (ref 15–150)
Iron Saturation: 10 % — ABNORMAL LOW (ref 15–55)
Iron: 33 ug/dL (ref 27–139)
Total Iron Binding Capacity: 339 ug/dL (ref 250–450)
UIBC: 306 ug/dL (ref 118–369)

## 2021-10-20 LAB — C-PEPTIDE: C-Peptide: 1.47 ng/mL (ref 0.80–3.85)

## 2021-10-21 ENCOUNTER — Other Ambulatory Visit: Payer: Self-pay

## 2021-10-21 ENCOUNTER — Ambulatory Visit (INDEPENDENT_AMBULATORY_CARE_PROVIDER_SITE_OTHER): Payer: PPO | Admitting: Internal Medicine

## 2021-10-21 DIAGNOSIS — J4520 Mild intermittent asthma, uncomplicated: Secondary | ICD-10-CM

## 2021-10-21 DIAGNOSIS — F439 Reaction to severe stress, unspecified: Secondary | ICD-10-CM

## 2021-10-21 DIAGNOSIS — E7800 Pure hypercholesterolemia, unspecified: Secondary | ICD-10-CM | POA: Diagnosis not present

## 2021-10-21 DIAGNOSIS — I671 Cerebral aneurysm, nonruptured: Secondary | ICD-10-CM

## 2021-10-21 DIAGNOSIS — E118 Type 2 diabetes mellitus with unspecified complications: Secondary | ICD-10-CM | POA: Diagnosis not present

## 2021-10-21 DIAGNOSIS — R634 Abnormal weight loss: Secondary | ICD-10-CM | POA: Diagnosis not present

## 2021-10-21 DIAGNOSIS — L4050 Arthropathic psoriasis, unspecified: Secondary | ICD-10-CM

## 2021-10-21 DIAGNOSIS — E237 Disorder of pituitary gland, unspecified: Secondary | ICD-10-CM

## 2021-10-21 DIAGNOSIS — K51419 Inflammatory polyps of colon with unspecified complications: Secondary | ICD-10-CM

## 2021-10-21 DIAGNOSIS — R198 Other specified symptoms and signs involving the digestive system and abdomen: Secondary | ICD-10-CM

## 2021-10-21 DIAGNOSIS — F32A Depression, unspecified: Secondary | ICD-10-CM

## 2021-10-21 DIAGNOSIS — Z794 Long term (current) use of insulin: Secondary | ICD-10-CM

## 2021-10-21 DIAGNOSIS — I10 Essential (primary) hypertension: Secondary | ICD-10-CM | POA: Diagnosis not present

## 2021-10-21 DIAGNOSIS — K219 Gastro-esophageal reflux disease without esophagitis: Secondary | ICD-10-CM | POA: Diagnosis not present

## 2021-10-21 NOTE — Progress Notes (Signed)
Patient ID: Ashley Gross, female   DOB: [birth year], 70 y.o.   MRN: [id number]   Subjective:    Patient ID: Ashley Gross, female    DOB: 08-13-1951, 70 y.o.   MRN: [id number]  This visit occurred during the SARS-CoV-2 public health emergency.  Safety protocols were in place, including screening questions prior to the visit, additional usage of staff PPE, and extensive cleaning of exam room while observing appropriate contact time as indicated for disinfecting solutions.   Patient here for a scheduled follow up.   Chief Complaint  Patient presents with   Trouble Sleeping   Weight Loss   .   HPI Has a history of chronic diarrhea.  Previous w/up - EGD, colonoscopy, VCE and unremarkable biopsies.  TTG IgA - negative, stool wbcs negative, infectious w/up negative, normal serum gastrin.  S/p cholecystectomy.  Had continued loose stools and bloody diarrhea.  Continued weight loss.  Seeing Dr Marius Ditch.  Diagnosed with exocrine pancreatic insufficiency.  Was started on zenpep.  Able to eat two small meals per day.  Continues to take creon.  She saw Dr Marius Ditch 10/16/21 - reported "swelling" in her LLQ after a meal.  Colonoscopy 07/2021 as outlined.  Recommended continuing zenpep. Encouraged high protein diet, protein supplements and high calorie diet and follow weight.  Off ozempic.  Ordered ultrasound for swelling.  She saw ENT and Dr Manuella Ghazi recently for chronic daily headaches and dizziness.  Negative ENT evaluation.  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  S/p diagnostic cerebral angiogram with endovascular aneurysm embolization.  Recommended 8439maspirin for one week.  Did well with her procedure.  She reports that she has noticed intermittent episodes of sharp pain - top of head.  May last 2 seconds to 2 minutes.  May have 2-3 episodes per day over the last week.  No headache.  No pain otherwise.  No vision change.  No change - dizziness or light headedness.  No chest pain.   Breathing stable.  No increased cough or congestion.  Persistent fullness - LLQ - notices more after eating.  Bowels stable.  No urinary symptoms.     Past Medical History:  Diagnosis Date   Abnormal liver function test 10/06/2012   Anemia, unspecified    Asthma    B12 deficiency 02/11/2019   Bleeding internal hemorrhoids 09/13/2017   Choledocholithiasis 06/29/2012   Formatting of this note might be different from the original. Dilated CBD on abdominal imaging 04/2012   Chronic diarrhea 02/16/2013   Colitis    Complication of anesthesia    asthma attack after shoulder surgery   Diabetes mellitus ([place]   type II   Esophagitis    GERD (gastroesophageal reflux disease)    Heart murmur    for years- nothing to be concerned about.   History of kidney stones    Hx of arteriovenous malformation (AVM)    Hypertension    IBS (irritable bowel syndrome)    Pernicious anemia    Personal history of colonic polyps 02/10/2013   02/08/13 colonoscopy - question of rectal varices, two 3-39m23molyps in the sigmoid colon, mass at the hepatic flexure, one 5mm73mlyp at the hepatic flexure, nodule at the ileocecal valve, congested mucusa in the entire colon, flattened villi mucosa in the terminal ileum.     Pneumonia    Psoriatic arthritis (HCC)[place] s/p penicillamine, plaquenil, MTX, sulfasalazine.  s/p Embrel, Humira,.  Iritis.  Pure hypercholesterolemia    Rectal bleeding 07/15/2017   Past Surgical History:  Procedure Laterality Date   ABDOMINAL HYSTERECTOMY  1981   ovaries left in place   ANKLE SURGERY     right   BUNIONECTOMY     CHOLECYSTECTOMY  1989   COLONOSCOPY WITH PROPOFOL N/A 12/27/2017   Procedure: COLONOSCOPY WITH PROPOFOL;  Surgeon: Lin Landsman, MD;  Location: [facility] ENDOSCOPY;  Service: Gastroenterology;  Laterality: N/A;   COLONOSCOPY WITH PROPOFOL N/A 01/04/2019   Procedure: COLONOSCOPY WITH PROPOFOL;  Surgeon: Lin Landsman, MD;  Location: [place];   Service: Endoscopy;  Laterality: N/A;  Diabetic - oral and injectable   COLONOSCOPY WITH PROPOFOL N/A 07/24/2021   Procedure: COLONOSCOPY WITH PROPOFOL;  Surgeon: Lin Landsman, MD;  Location: [place];  Service: Endoscopy;  Laterality: N/A;  Diabetic   ESOPHAGOGASTRODUODENOSCOPY (EGD) WITH PROPOFOL N/A 12/27/2017   Procedure: ESOPHAGOGASTRODUODENOSCOPY (EGD) WITH PROPOFOL;  Surgeon: Lin Landsman, MD;  Location: [facility] ENDOSCOPY;  Service: Gastroenterology;  Laterality: N/A;   HAND SURGERY     right   HEEL SPUR SURGERY     IR 3D INDEPENDENT WKST  10/01/2021   IR ANGIO INTRA EXTRACRAN SEL COM CAROTID INNOMINATE UNI L MOD SED  10/01/2021   IR ANGIO INTRA EXTRACRAN SEL INTERNAL CAROTID UNI R MOD SED  10/01/2021   IR ANGIO VERTEBRAL SEL VERTEBRAL BILAT MOD SED  10/01/2021   IR CT HEAD LTD  10/01/2021   IR TRANSCATH/EMBOLIZ  10/01/2021   IR US GUIDE VASC ACCESS RIGHT  10/01/2021   NOSE SURGERY     x2   POLYPECTOMY  01/04/2019   Procedure: POLYPECTOMY;  Surgeon: Lin Landsman, MD;  Location: [place];  Service: Endoscopy;;   POLYPECTOMY N/A 07/24/2021   Procedure: POLYPECTOMY;  Surgeon: Lin Landsman, MD;  Location: [place];  Service: Endoscopy;  Laterality: N/A;   RADIOLOGY WITH ANESTHESIA N/A 10/01/2021   Procedure: Starr Sinclair WITH ANESTHESIA;  Surgeon: Pedro Earls, MD;  Location: [place];  Service: Radiology;  Laterality: N/A;   SHOULDER ARTHROSCOPY WITH OPEN ROTATOR CUFF REPAIR Left 09/20/2018   Procedure: SHOULDER ARTHROSCOPY WITH OPEN ROTATOR CUFF REPAIR;  Surgeon: Corky Mull, MD;  Location: [facility];  Service: Orthopedics;  Laterality: Left;   SHOULDER CLOSED REDUCTION Left 12/21/2018   Procedure: MANIPULATION UNDER ANESTHESIA WITH STEROID INJECTION;  Surgeon: Corky Mull, MD;  Location: [place];  Service: Orthopedics;  Laterality: Left;  Diabetic - insulin and oral meds   UMBILICAL HERNIA REPAIR      Family History  Problem Relation Age of Onset   Diabetes Other    Hypertension Other    Breast cancer Neg Hx    Colon cancer Neg Hx    Social History   Socioeconomic History   Marital status: Widowed    Spouse name: Not on file   Number of children: 2   Years of education: Not on file   Highest education level: Not on file  Occupational History   Not on file  Tobacco Use   Smoking status: Never   Smokeless tobacco: Never  Vaping Use   Vaping Use: Never used  Substance and Sexual Activity   Alcohol use: Never   Drug use: Never   Sexual activity: Not Currently  Other Topics Concern   Not on file  Social History Narrative   She is married and has two children   Social Determinants of Engineer, drilling  Resource Strain: Medium Risk   Difficulty of Paying Living Expenses: Somewhat hard  Food Insecurity: No Food Insecurity   Worried About Charity fundraiser in the Last Year: Never true   Ran Out of Food in the Last Year: Never true  Transportation Needs: No Transportation Needs   Lack of Transportation (Medical): No   Lack of Transportation (Non-Medical): No  Physical Activity: Not on file  Stress: No Stress Concern Present   Feeling of Stress : Only a little  Social Connections: Unknown   Frequency of Communication with Friends and Family: More than three times a week   Frequency of Social Gatherings with Friends and Family: Not on file   Attends Religious Services: Not on file   Active Member of Clubs or Organizations: Not on file   Attends Archivist Meetings: Not on file   Marital Status: Not on file     Review of Systems  Constitutional:  Negative for appetite change and fever.       Weight loss as outlined.    HENT:  Negative for congestion and sinus pressure.   Respiratory:  Negative for cough, chest tightness and shortness of breath.   Cardiovascular:  Negative for chest pain, palpitations and leg swelling.  Gastrointestinal:  Negative for  nausea and vomiting.       Abdominal fullness as outlined.    Genitourinary:  Negative for difficulty urinating and dysuria.  Musculoskeletal:  Negative for joint swelling and myalgias.  Skin:  Negative for color change and rash.  Neurological:  Negative for dizziness and light-headedness.       Ead pain as outlined.    Psychiatric/Behavioral:  Negative for agitation.        Increased stress.  Still trying to cope with her husband's passing.        Objective:     BP 118/70    Pulse 82    Temp (!) 97.4 F (36.3 C)    Resp 16    Ht _0  (1.626 m)    Wt 113 lb 6.4 oz (51.4 kg)    SpO2 98%    BMI 19.47 kg/m  Wt Readings from Last 3 Encounters:  10/21/21 113 lb 6.4 oz (51.4 kg)  10/16/21 114 lb (51.7 kg)  10/01/21 121 lb 4.1 oz (55 kg)    Physical Exam Vitals reviewed.  Constitutional:      General: She is not in acute distress.    Appearance: Normal appearance.  HENT:     Head: Normocephalic and atraumatic.     Right Ear: External ear normal.     Left Ear: External ear normal.  Eyes:     General: No scleral icterus.       Right eye: No discharge.        Left eye: No discharge.     Conjunctiva/sclera: Conjunctivae normal.  Neck:     Thyroid: No thyromegaly.  Cardiovascular:     Rate and Rhythm: Normal rate and regular rhythm.  Pulmonary:     Effort: No respiratory distress.     Breath sounds: Normal breath sounds. No wheezing.  Abdominal:     General: Bowel sounds are normal.     Palpations: Abdomen is soft.     Comments: Fullness - LLQ - noticed with sitting/standing.  Improves with lying flat.  No significant pain to palpation.    Musculoskeletal:        General: No swelling or tenderness.     Cervical back:  Neck supple. No tenderness.  Lymphadenopathy:     Cervical: No cervical adenopathy.  Skin:    Findings: No erythema or rash.  Neurological:     Mental Status: She is alert.  Psychiatric:        Mood and Affect: Mood normal.        Behavior: Behavior  normal.     Outpatient Encounter Medications as of 10/21/2021  Medication Sig   acetaminophen (TYLENOL) 325 MG tablet Take 650 mg by mouth every 6 (six) hours as needed for moderate pain.   albuterol (PROVENTIL) (2.5 MG/3ML) 0.083% nebulizer solution Take 3 mLs (2.5 mg total) by nebulization every 4 (four) hours as needed for wheezing or shortness of breath.   amLODipine (NORVASC) 5 MG tablet TAKE 1 TABLET(5 MG) BY MOUTH TWICE DAILY   budesonide-formoterol (SYMBICORT) 160-4.5 MCG/ACT inhaler Inhale 2 puffs into the lungs 2 (two) times daily.   Cholecalciferol (VITAMIN D-3 PO) Take 2,000 Units by mouth daily.   CREON 36000-114000 units CPEP capsule Take 23,557-32,202 Units by mouth See admin instructions. 1 cap in the morning with breakfast , 2 caps at lunch and dinner, 1 cap with evening snack   cyclobenzaprine (FLEXERIL) 10 MG tablet TAKE 1 TABLET BY MOUTH EVERY NIGHT AS NEEDED (Patient taking differently: Take 10 mg by mouth at bedtime.)   diphenhydramine-acetaminophen (TYLENOL PM) 25-500 MG TABS tablet Take 2 tablets by mouth at bedtime.   insulin degludec (TRESIBA FLEXTOUCH) 100 UNIT/ML FlexTouch Pen Inject 14 Units into the skin daily. (Patient taking differently: Inject 16 Units into the skin daily. With biggest meal- usually dinner)   losartan (COZAAR) 100 MG tablet TAKE 1 TABLET BY MOUTH EVERY DAY   Melatonin 10 MG TABS Take 10 mg by mouth at bedtime.   omeprazole (PRILOSEC) 20 MG capsule TAKE 1 CAPSULE(20 MG) BY MOUTH TWICE DAILY   ONE TOUCH ULTRA TEST test strip PATIENT NEEDS NEW METER STRIPS AND LANCETS FOR ONE TOUCH. HER INSURANCE NO LONGER COVERS ACCUCHEK.   rosuvastatin (CRESTOR) 10 MG tablet TAKE 1 TABLET(10 MG) BY MOUTH DAILY   No facility-administered encounter medications on file as of 10/21/2021.     Lab Results  Component Value Date   WBC 4.8 10/17/2021   HGB 10.5 (L) 10/17/2021   HCT 32.2 (L) 10/17/2021   PLT 290.0 10/17/2021   GLUCOSE 118 (H) 10/17/2021   CHOL 135  10/17/2021   TRIG 91.0 10/17/2021   HDL 53.80 10/17/2021   LDLDIRECT 74.0 04/19/2019   LDLCALC 63 10/17/2021   ALT 11 10/17/2021   AST 17 10/17/2021   NA 141 10/17/2021   K 3.6 10/17/2021   CL 105 10/17/2021   CREATININE 0.96 10/17/2021   BUN 12 10/17/2021   CO2 29 10/17/2021   TSH 3.11 10/17/2021   INR 1.0 10/01/2021   HGBA1C 5.4 10/17/2021   MICROALBUR 2.0 (H) 11/13/2020    IR Transcath/Emboliz  Result Date: 10/01/2021 INDICATION: SKILAR MARCOU is a 70 year old female with a past medical history significant for colitis, asthma, DM, hypertension, anemia and psoriatic arthritis. 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 Patient denies smoking. There is no known family history of aneurysm. Findings were discussed with the patient in consultation August 20, 2021. The lesion was made to proceed with diagnostic cerebral angiogram and endovascular embolization of her aneurysm. EXAM: ULTRASOUND-GUIDED VASCULAR ACCESS DIAGNOSTIC CEREBRAL ANGIOGRAM 3D ROTATIONAL ANGIOGRAM ENDOVASCULAR  ANEURYSM EMBOLIZATION FLAT PANEL HEAD CT COMPARISON:  MRI of the brain August 15, 2021. MEDICATIONS: Ancef 2 g IV. The antibiotic was administered within 1 hour of the procedure. ANESTHESIA/SEDATION: The procedure was performed under general anesthesia. CONTRAST:  147 mL of Omnipaque 300 milligram/mL FLUOROSCOPY TIME:  Fluoroscopy Time: 22 minutes 24 seconds (1180 mGy). COMPLICATIONS: None immediate. TECHNIQUE: Informed written consent was obtained from the patient after a thorough discussion of the procedural risks, benefits and alternatives. All questions were addressed. Maximal Sterile Barrier Technique was utilized including caps, mask, sterile gowns, sterile gloves, sterile drape, hand hygiene and skin antiseptic. A timeout was performed prior to the initiation of the procedure. Using the modified  Seldinger technique and a micropuncture kit, access was gained to the right radial artery at the wrist and a 7 French sheath was placed. Real-time ultrasound guidance was utilized for vascular access including the acquisition of a permanent ultrasound image documenting patency of the accessed vessel. Slow intra arterial infusion of 5,000 IU heparin and 300 mcg nitroglycerin diluted in patient's own blood was performed. No significant fluctuation in patient's blood pressure seen. Then, a right radial artery angiogram was obtained via sheath side port. Next, a 5 [place] Simmons 2 glide catheter was navigated over a 0.035" Terumo Glidewire into the right subclavian artery under fluoroscopic guidance. Using road map guidance, the catheter was advanced into the right vertebral artery. Frontal and lateral angiograms of the head were obtained. The catheter was then advanced into the left common carotid artery. Frontal and lateral angiograms of the neck were obtained. Under biplane roadmap guidance, the catheter was advanced into the left internal carotid artery. Left ICA angiograms with frontal, lateral, magnified left anterior oblique and magnified lateral views of the head were obtained. The catheter was subsequently advanced into the right common carotid artery. Frontal and lateral angiograms of the neck were obtained. Using biplane roadmap guidance, the catheter was advanced into the right internal carotid artery. Frontal and lateral angiograms of the head were obtained. 3D rotational angiograms were acquired and post processed in a separate workstation under concurrent attending physician supervision. Selected images were sent to PACS. Magnified frontal and lateral angiograms of the head were obtained, centered on the anterior communicating artery. FINDINGS: 1. Normal brachial artery branching pattern seen. No significant anatomical variation. The right radial artery caliber is adequate for vascular access. 2. No  aneurysm, AVM, dural AV fistula, hemodynamically significant stenosis or other significant vascular abnormality in the posterior circulation. 3. Mild luminal irregularity of the cavernous segment of the left ICA, suggesting intracranial atherosclerotic disease without hemodynamically significant stenosis. 4. No significant atherosclerotic disease or significant stenosis of the bilateral carotid bifurcations. 5. 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 The aneurysm measures approximately 6.5 x 4.8 x 4.2 mm noting pseudo lobulation in the anterior aspect of the aneurysm. 6. Patent major veins and dural venous sinuses. PROCEDURE: The Simmons 2 glide catheter was exchanged over the wire and under biplane roadmap for a 7 [place] Rist catheter which was placed in the distal cervical segment of the right ICA. Magnified frontal and lateral angiograms of the head were obtained in the working projections. Next, a [place] EX catheter was navigated over a via 17 microcatheter and an Aristotle 14 micro guidewire into the cavernous segment of the right ICA. The microcatheter was then navigated over the wire into the right A1/ACA segment. Magnified frontal lateral angiograms of the head were obtained in the working projections. On the biplane roadmap, microcatheter  was navigated over the wire into the aneurysm pouch. Then, a 6 x 3 mm web SL device was deployed within the aneurysm pouch. Magnified frontal and lateral angiograms of the head were obtained showing adequate device positioning with adequate aneurysm occlusion. The device was subsequently detached. The microcatheter was removed. Right ICA angiograms with magnified frontal and lateral views of the head confirmed stable position of the device with adequate aneurysm occlusion. The [place] EX catheter was retracted into the petrous segment of the right ICA. Frontal and lateral angiograms with views of the entire head were obtained. No evidence of  thromboembolic complication seen. Flat panel CT of the head was obtained and post processed in a separate workstation with concurrent attending physician supervision. Selected images were sent to PACS. No evidence of hemorrhagic complication. The catheter was subsequently withdrawn. An inflatable band was placed and inflated over the right wrist access site. The vascular sheath was withdrawn and the band was slowly deflated until brisk flow was noted through the arteriotomy site. At this point, the band was reinflated with additional 3 cc of air to obtain patent hemostasis. IMPRESSION: 1. 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 2. No other aneurysm or other significant vascular abnormality identified. PLAN: Dual anti-platelet therapy for 48 hours followed by daily aspirin 81 mg for 1 week. Electronically Signed   By: Pedro Earls M.D.   On: 10/01/2021 17:03   IR 3D Independent Darreld Mclean  Result Date: 10/01/2021 INDICATION: Ashley Gross is a 70 year old female with a past medical history significant for colitis, asthma, DM, hypertension, anemia and psoriatic arthritis. 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 Patient denies smoking. There is no known family history of aneurysm. Findings were discussed with the patient in consultation August 20, 2021. The lesion was made to proceed with diagnostic cerebral angiogram and endovascular embolization of her aneurysm. EXAM: ULTRASOUND-GUIDED VASCULAR ACCESS DIAGNOSTIC CEREBRAL ANGIOGRAM 3D ROTATIONAL ANGIOGRAM ENDOVASCULAR ANEURYSM EMBOLIZATION FLAT PANEL HEAD CT COMPARISON:  MRI of the brain August 15, 2021. MEDICATIONS: Ancef 2 g IV. The antibiotic was administered within 1 hour of the procedure. ANESTHESIA/SEDATION: The procedure was performed under general  anesthesia. CONTRAST:  147 mL of Omnipaque 300 milligram/mL FLUOROSCOPY TIME:  Fluoroscopy Time: 22 minutes 24 seconds (1180 mGy). COMPLICATIONS: None immediate. TECHNIQUE: Informed written consent was obtained from the patient after a thorough discussion of the procedural risks, benefits and alternatives. All questions were addressed. Maximal Sterile Barrier Technique was utilized including caps, mask, sterile gowns, sterile gloves, sterile drape, hand hygiene and skin antiseptic. A timeout was performed prior to the initiation of the procedure. Using the modified Seldinger technique and a micropuncture kit, access was gained to the right radial artery at the wrist and a 7 French sheath was placed. Real-time ultrasound guidance was utilized for vascular access including the acquisition of a permanent ultrasound image documenting patency of the accessed vessel. Slow intra arterial infusion of 5,000 IU heparin and 300 mcg nitroglycerin diluted in patient's own blood was performed. No significant fluctuation in patient's blood pressure seen. Then, a right radial artery angiogram was obtained via sheath side port. Next, a 5 [place] Simmons 2 glide catheter was navigated over a 0.035" Terumo Glidewire into the right subclavian artery under fluoroscopic guidance. Using road map guidance, the catheter was advanced into the right vertebral  artery. Frontal and lateral angiograms of the head were obtained. The catheter was then advanced into the left common carotid artery. Frontal and lateral angiograms of the neck were obtained. Under biplane roadmap guidance, the catheter was advanced into the left internal carotid artery. Left ICA angiograms with frontal, lateral, magnified left anterior oblique and magnified lateral views of the head were obtained. The catheter was subsequently advanced into the right common carotid artery. Frontal and lateral angiograms of the neck were obtained. Using biplane roadmap guidance, the  catheter was advanced into the right internal carotid artery. Frontal and lateral angiograms of the head were obtained. 3D rotational angiograms were acquired and post processed in a separate workstation under concurrent attending physician supervision. Selected images were sent to PACS. Magnified frontal and lateral angiograms of the head were obtained, centered on the anterior communicating artery. FINDINGS: 1. Normal brachial artery branching pattern seen. No significant anatomical variation. The right radial artery caliber is adequate for vascular access. 2. No aneurysm, AVM, dural AV fistula, hemodynamically significant stenosis or other significant vascular abnormality in the posterior circulation. 3. Mild luminal irregularity of the cavernous segment of the left ICA, suggesting intracranial atherosclerotic disease without hemodynamically significant stenosis. 4. No significant atherosclerotic disease or significant stenosis of the bilateral carotid bifurcations. 5. 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 The aneurysm measures approximately 6.5 x 4.8 x 4.2 mm noting pseudo lobulation in the anterior aspect of the aneurysm. 6. Patent major veins and dural venous sinuses. PROCEDURE: The Simmons 2 glide catheter was exchanged over the wire and under biplane roadmap for a 7 [place] Rist catheter which was placed in the distal cervical segment of the right ICA. Magnified frontal and lateral angiograms of the head were obtained in the working projections. Next, a [place] EX catheter was navigated over a via 17 microcatheter and an Aristotle 14 micro guidewire into the cavernous segment of the right ICA. The microcatheter was then navigated over the wire into the right A1/ACA segment. Magnified frontal lateral angiograms of the head were obtained in the working projections. On the biplane roadmap, microcatheter was navigated over the wire into the aneurysm pouch. Then, a 6 x 3 mm web SL device was  deployed within the aneurysm pouch. Magnified frontal and lateral angiograms of the head were obtained showing adequate device positioning with adequate aneurysm occlusion. The device was subsequently detached. The microcatheter was removed. Right ICA angiograms with magnified frontal and lateral views of the head confirmed stable position of the device with adequate aneurysm occlusion. The [place] EX catheter was retracted into the petrous segment of the right ICA. Frontal and lateral angiograms with views of the entire head were obtained. No evidence of thromboembolic complication seen. Flat panel CT of the head was obtained and post processed in a separate workstation with concurrent attending physician supervision. Selected images were sent to PACS. No evidence of hemorrhagic complication. The catheter was subsequently withdrawn. An inflatable band was placed and inflated over the right wrist access site. The vascular sheath was withdrawn and the band was slowly deflated until brisk flow was noted through the arteriotomy site. At this point, the band was reinflated with additional 3 cc of air to obtain patent hemostasis. IMPRESSION: 1. 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 2. No other aneurysm or other significant vascular abnormality identified. PLAN: Dual anti-platelet therapy for 48 hours followed by daily aspirin 81 mg for 1 week. Electronically Signed  By: Pedro Earls M.D.   On: 10/01/2021 17:03   IR CT Head Ltd  Result Date: 10/01/2021 INDICATION: VINETTE CRITES is a 70 year old female with a past medical history significant for colitis, asthma, DM, hypertension, anemia and psoriatic arthritis. 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 Patient denies smoking. There is no known family history of  aneurysm. Findings were discussed with the patient in consultation August 20, 2021. The lesion was made to proceed with diagnostic cerebral angiogram and endovascular embolization of her aneurysm. EXAM: ULTRASOUND-GUIDED VASCULAR ACCESS DIAGNOSTIC CEREBRAL ANGIOGRAM 3D ROTATIONAL ANGIOGRAM ENDOVASCULAR ANEURYSM EMBOLIZATION FLAT PANEL HEAD CT COMPARISON:  MRI of the brain August 15, 2021. MEDICATIONS: Ancef 2 g IV. The antibiotic was administered within 1 hour of the procedure. ANESTHESIA/SEDATION: The procedure was performed under general anesthesia. CONTRAST:  147 mL of Omnipaque 300 milligram/mL FLUOROSCOPY TIME:  Fluoroscopy Time: 22 minutes 24 seconds (1180 mGy). COMPLICATIONS: None immediate. TECHNIQUE: Informed written consent was obtained from the patient after a thorough discussion of the procedural risks, benefits and alternatives. All questions were addressed. Maximal Sterile Barrier Technique was utilized including caps, mask, sterile gowns, sterile gloves, sterile drape, hand hygiene and skin antiseptic. A timeout was performed prior to the initiation of the procedure. Using the modified Seldinger technique and a micropuncture kit, access was gained to the right radial artery at the wrist and a 7 French sheath was placed. Real-time ultrasound guidance was utilized for vascular access including the acquisition of a permanent ultrasound image documenting patency of the accessed vessel. Slow intra arterial infusion of 5,000 IU heparin and 300 mcg nitroglycerin diluted in patient's own blood was performed. No significant fluctuation in patient's blood pressure seen. Then, a right radial artery angiogram was obtained via sheath side port. Next, a 5 [place] Simmons 2 glide catheter was navigated over a 0.035" Terumo Glidewire into the right subclavian artery under fluoroscopic guidance. Using road map guidance, the catheter was advanced into the right vertebral artery. Frontal and lateral angiograms of the  head were obtained. The catheter was then advanced into the left common carotid artery. Frontal and lateral angiograms of the neck were obtained. Under biplane roadmap guidance, the catheter was advanced into the left internal carotid artery. Left ICA angiograms with frontal, lateral, magnified left anterior oblique and magnified lateral views of the head were obtained. The catheter was subsequently advanced into the right common carotid artery. Frontal and lateral angiograms of the neck were obtained. Using biplane roadmap guidance, the catheter was advanced into the right internal carotid artery. Frontal and lateral angiograms of the head were obtained. 3D rotational angiograms were acquired and post processed in a separate workstation under concurrent attending physician supervision. Selected images were sent to PACS. Magnified frontal and lateral angiograms of the head were obtained, centered on the anterior communicating artery. FINDINGS: 1. Normal brachial artery branching pattern seen. No significant anatomical variation. The right radial artery caliber is adequate for vascular access. 2. No aneurysm, AVM, dural AV fistula, hemodynamically significant stenosis or other significant vascular abnormality in the posterior circulation. 3. Mild luminal irregularity of the cavernous segment of the left ICA, suggesting intracranial atherosclerotic disease without hemodynamically significant stenosis. 4. No significant atherosclerotic disease or significant stenosis of the bilateral carotid bifurcations. 5. 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 The aneurysm measures approximately 6.5 x 4.8 x 4.2 mm  noting pseudo lobulation in the anterior aspect of the aneurysm. 6. Patent major veins and dural venous sinuses. PROCEDURE: The Simmons 2 glide catheter was exchanged over the wire and under biplane roadmap for a 7 [place] Rist catheter which was placed in the distal cervical segment of the right ICA.  Magnified frontal and lateral angiograms of the head were obtained in the working projections. Next, a [place] EX catheter was navigated over a via 17 microcatheter and an Aristotle 14 micro guidewire into the cavernous segment of the right ICA. The microcatheter was then navigated over the wire into the right A1/ACA segment. Magnified frontal lateral angiograms of the head were obtained in the working projections. On the biplane roadmap, microcatheter was navigated over the wire into the aneurysm pouch. Then, a 6 x 3 mm web SL device was deployed within the aneurysm pouch. Magnified frontal and lateral angiograms of the head were obtained showing adequate device positioning with adequate aneurysm occlusion. The device was subsequently detached. The microcatheter was removed. Right ICA angiograms with magnified frontal and lateral views of the head confirmed stable position of the device with adequate aneurysm occlusion. The [place] EX catheter was retracted into the petrous segment of the right ICA. Frontal and lateral angiograms with views of the entire head were obtained. No evidence of thromboembolic complication seen. Flat panel CT of the head was obtained and post processed in a separate workstation with concurrent attending physician supervision. Selected images were sent to PACS. No evidence of hemorrhagic complication. The catheter was subsequently withdrawn. An inflatable band was placed and inflated over the right wrist access site. The vascular sheath was withdrawn and the band was slowly deflated until brisk flow was noted through the arteriotomy site. At this point, the band was reinflated with additional 3 cc of air to obtain patent hemostasis. IMPRESSION: 1. 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 2. No other aneurysm or other significant vascular abnormality identified. PLAN: Dual anti-platelet therapy for 48 hours followed by  daily aspirin 81 mg for 1 week. Electronically Signed   By: Pedro Earls M.D.   On: 10/01/2021 17:03   IR US Guide Vasc Access Right  Result Date: 10/01/2021 INDICATION: Ashley Gross is a 70 year old female with a past medical history significant for colitis, asthma, DM, hypertension, anemia and psoriatic arthritis. 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 Patient denies smoking. There is no known family history of aneurysm. Findings were discussed with the patient in consultation August 20, 2021. The lesion was made to proceed with diagnostic cerebral angiogram and endovascular embolization of her aneurysm. EXAM: ULTRASOUND-GUIDED VASCULAR ACCESS DIAGNOSTIC CEREBRAL ANGIOGRAM 3D ROTATIONAL ANGIOGRAM ENDOVASCULAR ANEURYSM EMBOLIZATION FLAT PANEL HEAD CT COMPARISON:  MRI of the brain August 15, 2021. MEDICATIONS: Ancef 2 g IV. The antibiotic was administered within 1 hour of the procedure. ANESTHESIA/SEDATION: The procedure was performed under general anesthesia. CONTRAST:  147 mL of Omnipaque 300 milligram/mL FLUOROSCOPY TIME:  Fluoroscopy Time: 22 minutes 24 seconds (1180 mGy). COMPLICATIONS: None immediate. TECHNIQUE: Informed written consent was obtained from the patient after a thorough discussion of the procedural risks, benefits and alternatives. All questions were addressed. Maximal Sterile Barrier Technique was utilized including caps, mask, sterile gowns, sterile gloves, sterile drape, hand hygiene and skin antiseptic. A timeout was performed prior to the initiation of the  procedure. Using the modified Seldinger technique and a micropuncture kit, access was gained to the right radial artery at the wrist and a 7 French sheath was placed. Real-time ultrasound guidance was utilized for vascular access including the acquisition of a permanent ultrasound image  documenting patency of the accessed vessel. Slow intra arterial infusion of 5,000 IU heparin and 300 mcg nitroglycerin diluted in patient's own blood was performed. No significant fluctuation in patient's blood pressure seen. Then, a right radial artery angiogram was obtained via sheath side port. Next, a 5 [place] Simmons 2 glide catheter was navigated over a 0.035" Terumo Glidewire into the right subclavian artery under fluoroscopic guidance. Using road map guidance, the catheter was advanced into the right vertebral artery. Frontal and lateral angiograms of the head were obtained. The catheter was then advanced into the left common carotid artery. Frontal and lateral angiograms of the neck were obtained. Under biplane roadmap guidance, the catheter was advanced into the left internal carotid artery. Left ICA angiograms with frontal, lateral, magnified left anterior oblique and magnified lateral views of the head were obtained. The catheter was subsequently advanced into the right common carotid artery. Frontal and lateral angiograms of the neck were obtained. Using biplane roadmap guidance, the catheter was advanced into the right internal carotid artery. Frontal and lateral angiograms of the head were obtained. 3D rotational angiograms were acquired and post processed in a separate workstation under concurrent attending physician supervision. Selected images were sent to PACS. Magnified frontal and lateral angiograms of the head were obtained, centered on the anterior communicating artery. FINDINGS: 1. Normal brachial artery branching pattern seen. No significant anatomical variation. The right radial artery caliber is adequate for vascular access. 2. No aneurysm, AVM, dural AV fistula, hemodynamically significant stenosis or other significant vascular abnormality in the posterior circulation. 3. Mild luminal irregularity of the cavernous segment of the left ICA, suggesting intracranial atherosclerotic disease  without hemodynamically significant stenosis. 4. No significant atherosclerotic disease or significant stenosis of the bilateral carotid bifurcations. 5. 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 The aneurysm measures approximately 6.5 x 4.8 x 4.2 mm noting pseudo lobulation in the anterior aspect of the aneurysm. 6. Patent major veins and dural venous sinuses. PROCEDURE: The Simmons 2 glide catheter was exchanged over the wire and under biplane roadmap for a 7 [place] Rist catheter which was placed in the distal cervical segment of the right ICA. Magnified frontal and lateral angiograms of the head were obtained in the working projections. Next, a [place] EX catheter was navigated over a via 17 microcatheter and an Aristotle 14 micro guidewire into the cavernous segment of the right ICA. The microcatheter was then navigated over the wire into the right A1/ACA segment. Magnified frontal lateral angiograms of the head were obtained in the working projections. On the biplane roadmap, microcatheter was navigated over the wire into the aneurysm pouch. Then, a 6 x 3 mm web SL device was deployed within the aneurysm pouch. Magnified frontal and lateral angiograms of the head were obtained showing adequate device positioning with adequate aneurysm occlusion. The device was subsequently detached. The microcatheter was removed. Right ICA angiograms with magnified frontal and lateral views of the head confirmed stable position of the device with adequate aneurysm occlusion. The [place] EX catheter was retracted into the petrous segment of the right ICA. Frontal and lateral angiograms with views of the entire head were obtained. No evidence of thromboembolic complication seen. Flat panel CT of the head was obtained and  post processed in a separate workstation with concurrent attending physician supervision. Selected images were sent to PACS. No evidence of hemorrhagic complication. The catheter was subsequently  withdrawn. An inflatable band was placed and inflated over the right wrist access site. The vascular sheath was withdrawn and the band was slowly deflated until brisk flow was noted through the arteriotomy site. At this point, the band was reinflated with additional 3 cc of air to obtain patent hemostasis. IMPRESSION: 1. 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 2. No other aneurysm or other significant vascular abnormality identified. PLAN: Dual anti-platelet therapy for 48 hours followed by daily aspirin 81 mg for 1 week. Electronically Signed   By: Pedro Earls M.D.   On: 10/01/2021 17:03   IR ANGIO INTRA EXTRACRAN SEL COM CAROTID INNOMINATE UNI L MOD SED  Result Date: 10/01/2021 INDICATION: Ashley Gross is a 70 year old female with a past medical history significant for colitis, asthma, DM, hypertension, anemia and psoriatic arthritis. 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 Patient denies smoking. There is no known family history of aneurysm. Findings were discussed with the patient in consultation August 20, 2021. The lesion was made to proceed with diagnostic cerebral angiogram and endovascular embolization of her aneurysm. EXAM: ULTRASOUND-GUIDED VASCULAR ACCESS DIAGNOSTIC CEREBRAL ANGIOGRAM 3D ROTATIONAL ANGIOGRAM ENDOVASCULAR ANEURYSM EMBOLIZATION FLAT PANEL HEAD CT COMPARISON:  MRI of the brain August 15, 2021. MEDICATIONS: Ancef 2 g IV. The antibiotic was administered within 1 hour of the procedure. ANESTHESIA/SEDATION: The procedure was performed under general anesthesia. CONTRAST:  147 mL of Omnipaque 300 milligram/mL FLUOROSCOPY TIME:  Fluoroscopy Time: 22 minutes 24 seconds (1180 mGy). COMPLICATIONS: None immediate. TECHNIQUE: Informed written consent was obtained from the patient after  a thorough discussion of the procedural risks, benefits and alternatives. All questions were addressed. Maximal Sterile Barrier Technique was utilized including caps, mask, sterile gowns, sterile gloves, sterile drape, hand hygiene and skin antiseptic. A timeout was performed prior to the initiation of the procedure. Using the modified Seldinger technique and a micropuncture kit, access was gained to the right radial artery at the wrist and a 7 French sheath was placed. Real-time ultrasound guidance was utilized for vascular access including the acquisition of a permanent ultrasound image documenting patency of the accessed vessel. Slow intra arterial infusion of 5,000 IU heparin and 300 mcg nitroglycerin diluted in patient's own blood was performed. No significant fluctuation in patient's blood pressure seen. Then, a right radial artery angiogram was obtained via sheath side port. Next, a 5 [place] Simmons 2 glide catheter was navigated over a 0.035" Terumo Glidewire into the right subclavian artery under fluoroscopic guidance. Using road map guidance, the catheter was advanced into the right vertebral artery. Frontal and lateral angiograms of the head were obtained. The catheter was then advanced into the left common carotid artery. Frontal and lateral angiograms of the neck were obtained. Under biplane roadmap guidance, the catheter was advanced into the left internal carotid artery. Left ICA angiograms with frontal, lateral, magnified left anterior oblique and magnified lateral views of the head were obtained. The catheter was subsequently advanced into the right common carotid artery. Frontal and lateral angiograms of the neck were obtained. Using biplane roadmap guidance, the catheter was advanced into the right internal carotid artery. Frontal and lateral angiograms of the head were obtained.  3D rotational angiograms were acquired and post processed in a separate workstation under concurrent attending  physician supervision. Selected images were sent to PACS. Magnified frontal and lateral angiograms of the head were obtained, centered on the anterior communicating artery. FINDINGS: 1. Normal brachial artery branching pattern seen. No significant anatomical variation. The right radial artery caliber is adequate for vascular access. 2. No aneurysm, AVM, dural AV fistula, hemodynamically significant stenosis or other significant vascular abnormality in the posterior circulation. 3. Mild luminal irregularity of the cavernous segment of the left ICA, suggesting intracranial atherosclerotic disease without hemodynamically significant stenosis. 4. No significant atherosclerotic disease or significant stenosis of the bilateral carotid bifurcations. 5. 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 The aneurysm measures approximately 6.5 x 4.8 x 4.2 mm noting pseudo lobulation in the anterior aspect of the aneurysm. 6. Patent major veins and dural venous sinuses. PROCEDURE: The Simmons 2 glide catheter was exchanged over the wire and under biplane roadmap for a 7 [place] Rist catheter which was placed in the distal cervical segment of the right ICA. Magnified frontal and lateral angiograms of the head were obtained in the working projections. Next, a [place] EX catheter was navigated over a via 17 microcatheter and an Aristotle 14 micro guidewire into the cavernous segment of the right ICA. The microcatheter was then navigated over the wire into the right A1/ACA segment. Magnified frontal lateral angiograms of the head were obtained in the working projections. On the biplane roadmap, microcatheter was navigated over the wire into the aneurysm pouch. Then, a 6 x 3 mm web SL device was deployed within the aneurysm pouch. Magnified frontal and lateral angiograms of the head were obtained showing adequate device positioning with adequate aneurysm occlusion. The device was subsequently detached. The microcatheter  was removed. Right ICA angiograms with magnified frontal and lateral views of the head confirmed stable position of the device with adequate aneurysm occlusion. The [place] EX catheter was retracted into the petrous segment of the right ICA. Frontal and lateral angiograms with views of the entire head were obtained. No evidence of thromboembolic complication seen. Flat panel CT of the head was obtained and post processed in a separate workstation with concurrent attending physician supervision. Selected images were sent to PACS. No evidence of hemorrhagic complication. The catheter was subsequently withdrawn. An inflatable band was placed and inflated over the right wrist access site. The vascular sheath was withdrawn and the band was slowly deflated until brisk flow was noted through the arteriotomy site. At this point, the band was reinflated with additional 3 cc of air to obtain patent hemostasis. IMPRESSION: 1. 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 2. No other aneurysm or other significant vascular abnormality identified. PLAN: Dual anti-platelet therapy for 48 hours followed by daily aspirin 81 mg for 1 week. Electronically Signed   By: Pedro Earls M.D.   On: 10/01/2021 17:03   IR ANGIO INTRA EXTRACRAN SEL INTERNAL CAROTID UNI R MOD SED  Result Date: 10/01/2021 INDICATION: TEARIA GIBBS is a 70 year old female with a past medical history significant for colitis, asthma, DM, hypertension, anemia and psoriatic arthritis. 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 Patient denies smoking. There is no known family history of aneurysm. Findings were discussed with the patient in consultation August 20, 2021. The lesion was made  to proceed with diagnostic cerebral angiogram and endovascular embolization of  her aneurysm. EXAM: ULTRASOUND-GUIDED VASCULAR ACCESS DIAGNOSTIC CEREBRAL ANGIOGRAM 3D ROTATIONAL ANGIOGRAM ENDOVASCULAR ANEURYSM EMBOLIZATION FLAT PANEL HEAD CT COMPARISON:  MRI of the brain August 15, 2021. MEDICATIONS: Ancef 2 g IV. The antibiotic was administered within 1 hour of the procedure. ANESTHESIA/SEDATION: The procedure was performed under general anesthesia. CONTRAST:  147 mL of Omnipaque 300 milligram/mL FLUOROSCOPY TIME:  Fluoroscopy Time: 22 minutes 24 seconds (1180 mGy). COMPLICATIONS: None immediate. TECHNIQUE: Informed written consent was obtained from the patient after a thorough discussion of the procedural risks, benefits and alternatives. All questions were addressed. Maximal Sterile Barrier Technique was utilized including caps, mask, sterile gowns, sterile gloves, sterile drape, hand hygiene and skin antiseptic. A timeout was performed prior to the initiation of the procedure. Using the modified Seldinger technique and a micropuncture kit, access was gained to the right radial artery at the wrist and a 7 French sheath was placed. Real-time ultrasound guidance was utilized for vascular access including the acquisition of a permanent ultrasound image documenting patency of the accessed vessel. Slow intra arterial infusion of 5,000 IU heparin and 300 mcg nitroglycerin diluted in patient's own blood was performed. No significant fluctuation in patient's blood pressure seen. Then, a right radial artery angiogram was obtained via sheath side port. Next, a 5 [place] Simmons 2 glide catheter was navigated over a 0.035" Terumo Glidewire into the right subclavian artery under fluoroscopic guidance. Using road map guidance, the catheter was advanced into the right vertebral artery. Frontal and lateral angiograms of the head were obtained. The catheter was then advanced into the left common carotid artery. Frontal and lateral angiograms of the neck were obtained. Under biplane roadmap guidance, the  catheter was advanced into the left internal carotid artery. Left ICA angiograms with frontal, lateral, magnified left anterior oblique and magnified lateral views of the head were obtained. The catheter was subsequently advanced into the right common carotid artery. Frontal and lateral angiograms of the neck were obtained. Using biplane roadmap guidance, the catheter was advanced into the right internal carotid artery. Frontal and lateral angiograms of the head were obtained. 3D rotational angiograms were acquired and post processed in a separate workstation under concurrent attending physician supervision. Selected images were sent to PACS. Magnified frontal and lateral angiograms of the head were obtained, centered on the anterior communicating artery. FINDINGS: 1. Normal brachial artery branching pattern seen. No significant anatomical variation. The right radial artery caliber is adequate for vascular access. 2. No aneurysm, AVM, dural AV fistula, hemodynamically significant stenosis or other significant vascular abnormality in the posterior circulation. 3. Mild luminal irregularity of the cavernous segment of the left ICA, suggesting intracranial atherosclerotic disease without hemodynamically significant stenosis. 4. No significant atherosclerotic disease or significant stenosis of the bilateral carotid bifurcations. 5. 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 The aneurysm measures approximately 6.5 x 4.8 x 4.2 mm noting pseudo lobulation in the anterior aspect of the aneurysm. 6. Patent major veins and dural venous sinuses. PROCEDURE: The Simmons 2 glide catheter was exchanged over the wire and under biplane roadmap for a 7 [place] Rist catheter which was placed in the distal cervical segment of the right ICA. Magnified frontal and lateral angiograms of the head were obtained in the working projections. Next, a [place] EX catheter was navigated over a via 17 microcatheter and an Aristotle  14 micro guidewire into the cavernous segment of the right ICA. The microcatheter was then navigated over the  wire into the right A1/ACA segment. Magnified frontal lateral angiograms of the head were obtained in the working projections. On the biplane roadmap, microcatheter was navigated over the wire into the aneurysm pouch. Then, a 6 x 3 mm web SL device was deployed within the aneurysm pouch. Magnified frontal and lateral angiograms of the head were obtained showing adequate device positioning with adequate aneurysm occlusion. The device was subsequently detached. The microcatheter was removed. Right ICA angiograms with magnified frontal and lateral views of the head confirmed stable position of the device with adequate aneurysm occlusion. The [place] EX catheter was retracted into the petrous segment of the right ICA. Frontal and lateral angiograms with views of the entire head were obtained. No evidence of thromboembolic complication seen. Flat panel CT of the head was obtained and post processed in a separate workstation with concurrent attending physician supervision. Selected images were sent to PACS. No evidence of hemorrhagic complication. The catheter was subsequently withdrawn. An inflatable band was placed and inflated over the right wrist access site. The vascular sheath was withdrawn and the band was slowly deflated until brisk flow was noted through the arteriotomy site. At this point, the band was reinflated with additional 3 cc of air to obtain patent hemostasis. IMPRESSION: 1. 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 2. No other aneurysm or other significant vascular abnormality identified. PLAN: Dual anti-platelet therapy for 48 hours followed by daily aspirin 81 mg for 1 week. Electronically Signed   By: Pedro Earls M.D.   On: 10/01/2021 17:03   IR ANGIO VERTEBRAL SEL VERTEBRAL BILAT MOD SED  Result  Date: 10/01/2021 INDICATION: Ashley Gross is a 70 year old female with a past medical history significant for colitis, asthma, DM, hypertension, anemia and psoriatic arthritis. 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 Patient denies smoking. There is no known family history of aneurysm. Findings were discussed with the patient in consultation August 20, 2021. The lesion was made to proceed with diagnostic cerebral angiogram and endovascular embolization of her aneurysm. EXAM: ULTRASOUND-GUIDED VASCULAR ACCESS DIAGNOSTIC CEREBRAL ANGIOGRAM 3D ROTATIONAL ANGIOGRAM ENDOVASCULAR ANEURYSM EMBOLIZATION FLAT PANEL HEAD CT COMPARISON:  MRI of the brain August 15, 2021. MEDICATIONS: Ancef 2 g IV. The antibiotic was administered within 1 hour of the procedure. ANESTHESIA/SEDATION: The procedure was performed under general anesthesia. CONTRAST:  147 mL of Omnipaque 300 milligram/mL FLUOROSCOPY TIME:  Fluoroscopy Time: 22 minutes 24 seconds (1180 mGy). COMPLICATIONS: None immediate. TECHNIQUE: Informed written consent was obtained from the patient after a thorough discussion of the procedural risks, benefits and alternatives. All questions were addressed. Maximal Sterile Barrier Technique was utilized including caps, mask, sterile gowns, sterile gloves, sterile drape, hand hygiene and skin antiseptic. A timeout was performed prior to the initiation of the procedure. Using the modified Seldinger technique and a micropuncture kit, access was gained to the right radial artery at the wrist and a 7 French sheath was placed. Real-time ultrasound guidance was utilized for vascular access including the acquisition of a permanent ultrasound image documenting patency of the accessed vessel. Slow intra arterial infusion of 5,000 IU heparin and 300 mcg nitroglycerin diluted in patient's own blood was performed. No  significant fluctuation in patient's blood pressure seen. Then, a right radial artery angiogram was obtained via sheath side port. Next, a 5 [place] Simmons 2 glide  catheter was navigated over a 0.035" [place] Glidewire into the right subclavian artery under fluoroscopic guidance. Using road map guidance, the catheter was advanced into the right vertebral artery. Frontal and lateral angiograms of the head were obtained. The catheter was then advanced into the left common carotid artery. Frontal and lateral angiograms of the neck were obtained. Under biplane roadmap guidance, the catheter was advanced into the left internal carotid artery. Left ICA angiograms with frontal, lateral, magnified left anterior oblique and magnified lateral views of the head were obtained. The catheter was subsequently advanced into the right common carotid artery. Frontal and lateral angiograms of the neck were obtained. Using biplane roadmap guidance, the catheter was advanced into the right internal carotid artery. Frontal and lateral angiograms of the head were obtained. 3D rotational angiograms were acquired and post processed in a separate workstation under concurrent attending physician supervision. Selected images were sent to PACS. Magnified frontal and lateral angiograms of the head were obtained, centered on the anterior communicating artery. FINDINGS: 1. Normal brachial artery branching pattern seen. No significant anatomical variation. The right radial artery caliber is adequate for vascular access. 2. No aneurysm, AVM, dural AV fistula, hemodynamically significant stenosis or other significant vascular abnormality in the posterior circulation. 3. Mild luminal irregularity of the cavernous segment of the left ICA, suggesting intracranial atherosclerotic disease without hemodynamically significant stenosis. 4. No significant atherosclerotic disease or significant stenosis of the bilateral carotid bifurcations. 5. Saccular  aneurysm identified at the right A1-A2/ACA junction. The aneurysm measures approximately 6.5 x 4.8 x 4.2 mm noting pseudo lobulation in the anterior aspect of the aneurysm. 6. Patent major veins and dural venous sinuses. PROCEDURE: The Simmons 2 glide catheter was exchanged over the wire and under biplane roadmap for a 7 [place] Rist catheter which was placed in the distal cervical segment of the right ICA. Magnified frontal and lateral angiograms of the head were obtained in the working projections. Next, a [place] EX catheter was navigated over a via 17 microcatheter and an Aristotle 14 micro guidewire into the cavernous segment of the right ICA. The microcatheter was then navigated over the wire into the right A1/ACA segment. Magnified frontal lateral angiograms of the head were obtained in the working projections. On the biplane roadmap, microcatheter was navigated over the wire into the aneurysm pouch. Then, a 6 x 3 mm web SL device was deployed within the aneurysm pouch. Magnified frontal and lateral angiograms of the head were obtained showing adequate device positioning with adequate aneurysm occlusion. The device was subsequently detached. The microcatheter was removed. Right ICA angiograms with magnified frontal and lateral views of the head confirmed stable position of the device with adequate aneurysm occlusion. The [place] EX catheter was retracted into the petrous segment of the right ICA. Frontal and lateral angiograms with views of the entire head were obtained. No evidence of thromboembolic complication seen. Flat panel CT of the head was obtained and post processed in a separate workstation with concurrent attending physician supervision. Selected images were sent to PACS. No evidence of hemorrhagic complication. The catheter was subsequently withdrawn. An inflatable band was placed and inflated over the right wrist access site. The vascular sheath was withdrawn and the band was slowly deflated until  brisk flow was noted through the arteriotomy site. At this point, the band was reinflated with additional 3 cc of air to obtain patent hemostasis. IMPRESSION: 1. 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 2. No  other aneurysm or other significant vascular abnormality identified. PLAN: Dual anti-platelet therapy for 48 hours followed by daily aspirin 81 mg for 1 week. Electronically Signed   By: Pedro Earls M.D.   On: 10/01/2021 17:03       Assessment & Plan:   Problem List Items Addressed This Visit     Aneurysm of anterior communicating artery     MRI - incidental finding of a 42m anterior communicating artery aneurysm.  S/p diagnostic cerebral angiogram with endovascular aneurysm embolization.  Recommended 86maspirin for one week. She did well with the procedure.  Described head pain as outlined.  Discussed with Dr DePatrecia Pour She is currently not having any pain.  No vision change.  No dizziness or light headedness.  Recommended no further intervention at this time.  She is to monitor symptoms closely.  If any change or worsening symptoms, or if persistent, needs to be reevaluated.        Asthma    Breathing stable.       Controlled type 2 diabetes mellitus with complication, with long-term current use of insulin ([facility]   Has lost a lot of weight.  On no medication now.  Follow met b and a1c.        GERD (gastroesophageal reflux disease)    No upper symptoms.  On omeprazole.        Hypercholesterolemia    Continue crestor. Follow lipid panel and liver function tests.        Hypertension    Blood pressure as outlined.  On amlodipine and losartan.   Follow pressures.  Follow metabolic panel.       Inflammatory polyps of colon ([facility]   S/p colonoscopy 07/24/21 as outlined.  Continues f/u with GI.       LLQ fullness    Fullness as outlined.  Appears to improved with lying flat.  Question of  hernia.  Saw GI. She has abdominal ultrasound scheduled.  Discussed possible surgery referral for evaluation.  Previous CT as outlined.        Loss of weight    Persistent.  Has had extensive GI w/up as outlined.  Continue to encourage increased po intake.  Scheduled for abdominal ultrasound.  Follow.        Mild depression    Trying to cope with her husband's passing.  Follow.       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[facility]   See MRI.  Seeing neurology.  Allow her to recover from recent intervention.  D/w neurology regarding f/u.        Psoriatic arthritis ([facility]   Stable.  Followed by Dr KeJefm Bryant      Stress    Was on remeron.  Was helping at first.  Has tried trazodone.  No benefit.  Melatonin.  Have discussed psych referral.         I spent 45 minutes with the patient and more than 50% of the time was spent in consultation regarding the above.  Time spent discussing her current symptoms and concerns and also recent intervention.  Time also spent discussing further evaluation and treatment.    ChEinar PheasantMD

## 2021-10-22 ENCOUNTER — Telehealth: Payer: Self-pay

## 2021-10-22 NOTE — Telephone Encounter (Signed)
-----   Message from Lin Landsman, MD sent at 10/20/2021 11:48 AM EST ----- Please inform patient that she has iron deficiency, recommend fusion plus, one pill every other day   RV

## 2021-10-22 NOTE — Telephone Encounter (Signed)
Forward to dr Marius Ditch For medicine

## 2021-10-23 ENCOUNTER — Other Ambulatory Visit: Payer: Self-pay | Admitting: Gastroenterology

## 2021-10-23 ENCOUNTER — Telehealth: Payer: Self-pay

## 2021-10-23 DIAGNOSIS — R1904 Left lower quadrant abdominal swelling, mass and lump: Secondary | ICD-10-CM

## 2021-10-23 DIAGNOSIS — K8681 Exocrine pancreatic insufficiency: Secondary | ICD-10-CM

## 2021-10-23 NOTE — Chronic Care Management (AMB) (Signed)
°  Care Management   Note  10/23/2021 Name: MARDA BREIDENBACH MRN: [id number] DOB: [birth year]  [name] is a 70 y.o. year old female who is a primary care patient of Einar Pheasant, MD and is actively engaged with the care management team. I reached out to [name] by phone today to assist with re-scheduling a follow up visit with the Pharmacist  Follow up plan: Unsuccessful telephone outreach attempt made. A HIPAA compliant phone message was left for the patient providing contact information and requesting a return call.  The care management team will reach out to the patient again over the next 7 days.  If patient returns call to provider office, please advise to call [facility]  at [place], [place], [place], [place] [address] Direct Dial: [phone] [email or web address]: [email or web address]

## 2021-10-23 NOTE — Telephone Encounter (Signed)
-----   Message from Lin Landsman, MD sent at 10/23/2021  4:10 PM EST ----- Okay, have her take over-the-counter iron supplement every other day for 3 months  RV ----- Message ----- From: Eliseo Squires, CMA Sent: 10/22/2021   4:11 PM EST To: Lin Landsman, MD  When I put in for fusion plus for patient it says she has a allergy to that medicine ----- Message ----- From: Lin Landsman, MD Sent: 10/20/2021  11:48 AM EST To: Eliseo Squires, CMA  Please inform patient that she has iron deficiency, recommend fusion plus, one pill every other day   RV

## 2021-10-23 NOTE — Telephone Encounter (Signed)
Called patient we discussed her results she understands and will get some over the counter iron supplements to take

## 2021-10-24 ENCOUNTER — Ambulatory Visit: Admission: RE | Admit: 2021-10-24 | Payer: PPO | Source: Ambulatory Visit

## 2021-10-28 NOTE — Chronic Care Management (AMB) (Signed)
°  Care Management   Note  10/28/2021 Name: Ashley Gross MRN: [id number] DOB: Sep 22, 1951  Ashley Gross is a 70 y.o. year old female who is a primary care patient of Einar Pheasant, MD and is actively engaged with the care management team. I reached out to Ashley Gross by phone today to assist with re-scheduling a follow up visit with the Pharmacist  Follow up plan: Telephone appointment with care management team member scheduled for:11/05/2021  Noreene Lar[address], [place], [place], [place] [address] Direct Dial: [phone] [email or web address]: [email or web address]

## 2021-10-28 NOTE — Telephone Encounter (Signed)
Pt has been scheduled.  °

## 2021-10-29 ENCOUNTER — Other Ambulatory Visit: Payer: Self-pay

## 2021-10-29 ENCOUNTER — Other Ambulatory Visit: Payer: Self-pay | Admitting: Gastroenterology

## 2021-10-29 ENCOUNTER — Ambulatory Visit
Admission: RE | Admit: 2021-10-29 | Discharge: 2021-10-29 | Disposition: A | Discharge status: Home / Self Care | Payer: PPO | Source: Ambulatory Visit | Attending: Gastroenterology | Admitting: Gastroenterology

## 2021-10-29 DIAGNOSIS — K8681 Exocrine pancreatic insufficiency: Secondary | ICD-10-CM | POA: Diagnosis not present

## 2021-10-29 DIAGNOSIS — R1904 Left lower quadrant abdominal swelling, mass and lump: Secondary | ICD-10-CM | POA: Diagnosis not present

## 2021-10-31 ENCOUNTER — Encounter: Payer: Self-pay | Admitting: Internal Medicine

## 2021-10-31 DIAGNOSIS — K4090 Unilateral inguinal hernia, without obstruction or gangrene, not specified as recurrent: Secondary | ICD-10-CM | POA: Insufficient documentation

## 2021-10-31 DIAGNOSIS — E237 Disorder of pituitary gland, unspecified: Secondary | ICD-10-CM | POA: Insufficient documentation

## 2021-10-31 NOTE — Assessment & Plan Note (Signed)
No upper symptoms.  On omeprazole.  

## 2021-10-31 NOTE — Assessment & Plan Note (Signed)
Continue crestor.  Follow lipid panel and liver function tests.  

## 2021-10-31 NOTE — Assessment & Plan Note (Signed)
Blood pressure as outlined.  On amlodipine and losartan.   Follow pressures.  Follow metabolic panel.

## 2021-10-31 NOTE — Assessment & Plan Note (Signed)
Persistent.  Has had extensive GI w/up as outlined.  Continue to encourage increased po intake.  Scheduled for abdominal ultrasound.  Follow.

## 2021-10-31 NOTE — Assessment & Plan Note (Signed)
See MRI.  Seeing neurology.  Allow her to recover from recent intervention.  D/w neurology regarding f/u.

## 2021-10-31 NOTE — Assessment & Plan Note (Signed)
Fullness as outlined.  Appears to improved with lying flat.  Question of hernia.  Saw GI. She has abdominal ultrasound scheduled.  Discussed possible surgery referral for evaluation.  Previous CT as outlined.

## 2021-10-31 NOTE — Assessment & Plan Note (Signed)
S/p colonoscopy 07/24/21 as outlined.  Continues f/u with GI.

## 2021-10-31 NOTE — Assessment & Plan Note (Signed)
Stable.  Followed by Dr Kernodle.   

## 2021-10-31 NOTE — Assessment & Plan Note (Signed)
Was on remeron.  Was helping at first.  Has tried trazodone.  No benefit.  Melatonin.  Have discussed psych referral.

## 2021-10-31 NOTE — Assessment & Plan Note (Signed)
MRI - incidental finding of a 61mm anterior communicating artery aneurysm.  S/p diagnostic cerebral angiogram with endovascular aneurysm embolization.  Recommended 81mg  aspirin for one week. She did well with the procedure.  Described head pain as outlined.  Discussed with Dr Patrecia Pour.  She is currently not having any pain.  No vision change.  No dizziness or light headedness.  Recommended no further intervention at this time.  She is to monitor symptoms closely.  If any change or worsening symptoms, or if persistent, needs to be reevaluated.

## 2021-10-31 NOTE — Assessment & Plan Note (Signed)
Breathing stable.

## 2021-10-31 NOTE — Assessment & Plan Note (Signed)
Has lost a lot of weight.  On no medication now.  Follow met b and a1c.   ?

## 2021-10-31 NOTE — Assessment & Plan Note (Addendum)
Trying to cope with her husband's passing.  Follow.

## 2021-11-04 ENCOUNTER — Telehealth: Payer: Self-pay

## 2021-11-04 DIAGNOSIS — H5789 Other specified disorders of eye and adnexa: Secondary | ICD-10-CM

## 2021-11-04 NOTE — Telephone Encounter (Signed)
-----   Message from Lin Landsman, MD sent at 11/04/2021  4:35 PM EST ----- Please inform patient that the swelling in her left lower quadrant is secondary to hernia containing fat only  RV

## 2021-11-04 NOTE — Telephone Encounter (Signed)
Patient verbalized understanding. She would like a referral to general surgery to discuss the hernia. Placed referral per Dr. Marius Ditch okay

## 2021-11-05 ENCOUNTER — Telehealth (HOSPITAL_COMMUNITY): Payer: Self-pay

## 2021-11-05 ENCOUNTER — Encounter: Payer: Self-pay | Admitting: Internal Medicine

## 2021-11-05 ENCOUNTER — Ambulatory Visit (INDEPENDENT_AMBULATORY_CARE_PROVIDER_SITE_OTHER): Payer: PPO | Admitting: Pharmacist

## 2021-11-05 DIAGNOSIS — R053 Chronic cough: Secondary | ICD-10-CM

## 2021-11-05 DIAGNOSIS — D329 Benign neoplasm of meninges, unspecified: Secondary | ICD-10-CM | POA: Insufficient documentation

## 2021-11-05 DIAGNOSIS — E7800 Pure hypercholesterolemia, unspecified: Secondary | ICD-10-CM

## 2021-11-05 DIAGNOSIS — E1121 Type 2 diabetes mellitus with diabetic nephropathy: Secondary | ICD-10-CM

## 2021-11-05 DIAGNOSIS — I10 Essential (primary) hypertension: Secondary | ICD-10-CM

## 2021-11-05 MED ORDER — TRESIBA FLEXTOUCH 100 UNIT/ML ~~LOC~~ SOPN: 14.0000 [IU] | PEN_INJECTOR | Freq: Every day | SUBCUTANEOUS | 2 refills | Status: DC

## 2021-11-05 MED ORDER — BUDESONIDE-FORMOTEROL FUMARATE 160-4.5 MCG/ACT IN AERO: 2.0000 | INHALATION_SPRAY | Freq: Two times a day (BID) | RESPIRATORY_TRACT | 3 refills | Status: DC

## 2021-11-05 NOTE — Patient Instructions (Signed)
Visit Information  Following are the goals we discussed today:  Plan: Telephone follow up appointment with care management team member scheduled for:  3 months   [name] Darnelle Maffucci, PharmD, [place], CPP Clinical Pharmacist [place] [facility] [phone]      Please call the care guide team at [phone] if you need to cancel or reschedule your appointment.   The patient verbalized understanding of instructions, educational materials, and care plan provided today and declined offer to receive copy of patient instructions, educational materials, and care plan.

## 2021-11-05 NOTE — Telephone Encounter (Signed)
Returned pt's call, no answer, left vm. AW  

## 2021-11-05 NOTE — Chronic Care Management (AMB) (Signed)
Chronic Care Management CCM Pharmacy Note  11/05/2021 Name:  Ashley Gross MRN:  [id number] DOB:  09/23/1951  Summary: - Due to reapply for assistance for Ashley Gross, Symbicort for 2023  Recommendations/Changes made from today's visit: - Refill sent on Symbicort - Reduce Tresiba to 14 units daily  Subjective: Ashley Gross is an 71 y.o. year old female who is a primary patient of Ashley Pheasant, MD.  The CCM team was consulted for assistance with disease management and care coordination needs.    Engaged with patient by telephone for follow up visit for pharmacy case management and/or care coordination services.   Objective:  Medications Reviewed Today     Reviewed by Ashley Pheasant, MD (Physician) on 10/31/21 at 1208  Med List Status: <None>   Medication Order Taking? Sig Documenting Provider Last Dose Status Informant  acetaminophen (TYLENOL) 325 MG tablet [id number] No Take 650 mg by mouth every 6 (six) hours as needed for moderate pain. [provider] Taking Active Self           Med Note Wilmon Pali, MELISSA R   Wed Sep 24, 2021  1:18 PM)    albuterol (PROVENTIL) (2.5 MG/3ML) 0.083% nebulizer solution [id number] No Take 3 mLs (2.5 mg total) by nebulization every 4 (four) hours as needed for wheezing or shortness of breath. McLean-Scocuzza, Nino Glow, MD Taking Active Self  amLODipine (NORVASC) 5 MG tablet [id number] No TAKE 1 TABLET(5 MG) BY MOUTH TWICE DAILY Ashley Pheasant, MD Taking Active Self  budesonide-formoterol [facility]) 160-4.5 MCG/ACT inhaler [id number] No Inhale 2 puffs into the lungs 2 (two) times daily. Ashley Pheasant, MD Taking Active Self  Cholecalciferol (VITAMIN D-3 PO) [id number] No Take 2,000 Units by mouth daily. [provider] Taking Active Self  CREON 36000-114000 units CPEP capsule [id number] No Take 36,000-72,000 Units by mouth See admin instructions. 1 cap in the morning with breakfast , 2 caps at lunch and dinner, 1 cap with evening  snack [provider] Taking Active Self  cyclobenzaprine (FLEXERIL) 10 MG tablet [id number] No TAKE 1 TABLET BY MOUTH EVERY NIGHT AS NEEDED  Patient taking differently: Take 10 mg by mouth at bedtime.   Ashley Pheasant, MD Taking Active Self  diphenhydramine-acetaminophen (TYLENOL PM) 25-500 MG TABS tablet [id number] No Take 2 tablets by mouth at bedtime. [provider] Taking Active Self  insulin degludec (TRESIBA FLEXTOUCH) 100 UNIT/ML FlexTouch Pen [id number] No Inject 14 Units into the skin daily.  Patient taking differently: Inject 16 Units into the skin daily. With biggest meal- usually dinner   Ashley Pheasant, MD Taking Active Self  losartan (COZAAR) 100 MG tablet [id number] No TAKE 1 TABLET BY MOUTH EVERY DAY Ashley Pheasant, MD Taking Active Self  Melatonin 10 MG TABS [id number] No Take 10 mg by mouth at bedtime. [provider] Taking Active Self  omeprazole (PRILOSEC) 20 MG capsule [id number] No TAKE 1 CAPSULE(20 MG) BY MOUTH TWICE DAILY Ashley Pheasant, MD Taking Active Self  ONE TOUCH ULTRA TEST test strip [id number] No PATIENT NEEDS NEW METER STRIPS AND LANCETS FOR ONE TOUCH. HER INSURANCE NO LONGER COVERS ACCUCHEK. Ashley Pheasant, MD Taking Active Self  rosuvastatin (CRESTOR) 10 MG tablet [id number] No TAKE 1 TABLET(10 MG) BY MOUTH DAILY Ashley Pheasant, MD Taking Active Self            Pertinent Labs:   Lab Results  Component Value Date   HGBA1C 5.4 10/17/2021   Lab Results  Component Value Date   CHOL 135 10/17/2021  HDL 53.80 10/17/2021   LDLCALC 63 10/17/2021   LDLDIRECT 74.0 04/19/2019   TRIG 91.0 10/17/2021   CHOLHDL 3 10/17/2021   Lab Results  Component Value Date   CREATININE 0.96 10/17/2021   BUN 12 10/17/2021   NA 141 10/17/2021   K 3.6 10/17/2021   CL 105 10/17/2021   CO2 29 10/17/2021    SDOH:  (Social Determinants of Health) assessments and interventions performed:  SDOH Interventions    Flowsheet Row Most Recent Value   SDOH Interventions   Financial Strain Interventions Other (Comment)  [manufacturer assistance]       CCM Care Plan  Review of patient past medical history, allergies, medications, health status, including review of consultants reports, laboratory and other test data, was performed as part of comprehensive evaluation and provision of chronic care management services.   Care Plan : Medication Management  Updates made by De Hollingshead, RPH-CPP since 11/05/2021 12:00 AM     Problem: Diabetes, Hypertension, Hyperlipidemia      Long-Range Goal: Disease Progression Prevention   Recent Progress: On track  Priority: High  Note:   Current Barriers:  Unable to independently afford treatment regimen  Pharmacist Clinical Goal(s):  Over the next 90 days, patient will verbalize ability to afford treatment regimen. Over the next 90 days, patient will maintain control of diabetes as evidenced by improvement in A1c through collaboration with PharmD and provider.   Interventions: 1:1 collaboration with Ashley Pheasant, MD regarding development and update of comprehensive plan of care as evidenced by provider attestation and co-signature Inter-disciplinary care team collaboration (see longitudinal plan of care) Comprehensive medication review performed; medication list updated in electronic medical record  Health Maintenance   Yearly diabetic eye exam: due - recommended to pursue moving forward Yearly diabetic foot exam: up to date Urine microalbumin: up to date Yearly influenza vaccination: up to date Td/Tdap vaccination: due - will discuss moving forward Pneumonia vaccination: up to date COVID vaccinations: due- recommend bivalent booster Shingrix vaccinations: due - will discuss moving forward Colonoscopy: up to date Bone density scan: up to date Mammogram: up to date  Diabetes: Controlled; current treatment: Tresiba 16 units daily - though had previously advised to reduce to 14  units daily Hx recurrent UTIs on Jardiance,  Recommended to reduce Tresiba dose to 14 units daily Will collaborate w/ CPhT, patient, and providers to reapply for patient assistance for [name] for 2023.  Hypertension: Controlled per last clinic reading; current treatment: amlodipine 5 mg BID, losartan 100 mg QPM  Previously recommended to continue current regimen with collaboration with PCP   Hyperlipidemia: Controlled per last lipid panel; current treatment: rosuvastatin 10 mg daily Previously recommended to continue current regimen at this time  Asthma: Controlled per patient symptom report; current regimen: Symbicort 160/4.5 mcg 2 puffs BID Approved for Symbicort assistance through 11/01/21 Will collaborate w/ CPhT, patient, and providers to reapply for patient assistance for Symbicort for 2023.  Pancreatic Enzyme Insufficiency: Uncontrolled; current regimen: Creon 1 capsule with meals and snacks Approved for Creon assistance through 11/01/22 Recommended to continue current regimen at this time  Patient Goals/Self-Care Activities Over the next 90 days, patient will:  - take medications as prescribed check blood glucose periodically, document, and provide at future appointments check blood pressure periodically, document, and provide at future appointments collaborate with provider on medication access solutions      Plan: Telephone follow up appointment with care management team member scheduled for:  3 months  [name] Darnelle Maffucci, PharmD, [place],  CPP Clinical Probation officer at [place]

## 2021-11-06 ENCOUNTER — Telehealth (HOSPITAL_COMMUNITY): Payer: Self-pay

## 2021-11-06 NOTE — Telephone Encounter (Signed)
Called to schedule ct, no answer, left vm. AW

## 2021-11-10 ENCOUNTER — Telehealth (HOSPITAL_COMMUNITY): Payer: Self-pay

## 2021-11-10 NOTE — Telephone Encounter (Signed)
Called to schedule ct head, no answer, left vm. AW

## 2021-11-11 ENCOUNTER — Telehealth: Payer: Self-pay | Admitting: Pharmacy Technician

## 2021-11-11 ENCOUNTER — Ambulatory Visit (HOSPITAL_COMMUNITY)
Admission: RE | Admit: 2021-11-11 | Discharge: 2021-11-11 | Disposition: A | Discharge status: Home / Self Care | Payer: PPO | Source: Ambulatory Visit | Attending: Neuroradiology | Admitting: Neuroradiology

## 2021-11-11 ENCOUNTER — Other Ambulatory Visit: Payer: Self-pay

## 2021-11-11 DIAGNOSIS — R519 Headache, unspecified: Secondary | ICD-10-CM | POA: Insufficient documentation

## 2021-11-11 DIAGNOSIS — I671 Cerebral aneurysm, nonruptured: Secondary | ICD-10-CM | POA: Diagnosis not present

## 2021-11-11 DIAGNOSIS — Z596 Low income: Secondary | ICD-10-CM

## 2021-11-11 NOTE — Progress Notes (Addendum)
[facility])                                            [place] Team    11/11/2021  Ashley Gross 1951-01-27 [id number]                                      Medication Assistance Referral  Referral From: [facility] Embedded RPh Ashley T.   Medication/Company: [name] / [employer] Patient application portion:  Education officer, museum portion: Interoffice Mailed to Dr. Nicki Gross Provider address/fax verified via: Office website  Medication/Company: Symbicort / [place] Patient application portion:  N/A per [place] patient should automatically be re enrolled since patient was enrolled in 2022 and has Medicare Part D Provider application portion: Interoffice Mailed to Dr. Nicki Gross Provider address/fax verified via: Office website  Received  provider portion(s) of patient assistance application(s) for Symbicort. Embedded PharmD escribed prescription into  [place].   Ashley Jemmott P. Ashley Gross, [place]  [phone]

## 2021-11-12 ENCOUNTER — Encounter: Payer: Self-pay | Admitting: Surgery

## 2021-11-12 ENCOUNTER — Telehealth: Payer: Self-pay | Admitting: Surgery

## 2021-11-12 ENCOUNTER — Ambulatory Visit: Payer: PPO | Admitting: Surgery

## 2021-11-12 VITALS — BP 147/78 | HR 77 | Temp 98.3°F | Ht 64.0 in | Wt 111.6 lb

## 2021-11-12 DIAGNOSIS — K4090 Unilateral inguinal hernia, without obstruction or gangrene, not specified as recurrent: Secondary | ICD-10-CM

## 2021-11-12 NOTE — Progress Notes (Signed)
11/12/2021  Reason for Visit:  Left inguinal hernia  Requesting Provider:  Sherri Sear, MD  History of Present Illness: Ashley Gross is a 71 y.o. female presenting for evaluation of left inguinal hernia.  The patient reports having long history of diarrhea and is being treated for possible pancreatic insufficiency by Dr. Marius Ditch.  She also reports having left lower quadrant fullness and bulging sensation that has been ongoing for several months.  She had an ultrasound of the pelvis on 10/29/2021 which noted a fat-containing hernia.  Based on location reported by the patient, this appears to be a left inguinal hernia.  She does note that when she strains for instance when sitting up, the bulge in the left groin goes out.  There is no significant discomfort with this but does notice the area of swelling in the left groin.  Denies any fevers, chills, chest pain, shortness of breath, nausea, vomiting, other areas of abdominal pain.  She has had prior abdominal surgeries including abdominal hysterectomy, cholecystectomy, and umbilical hernia repair.  She is uncertain if mesh was used or not.  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   Past Medical History: Past Medical History:  Diagnosis Date   Abnormal liver function test 10/06/2012   Anemia, unspecified    Asthma    B12 deficiency 02/11/2019   Bleeding internal hemorrhoids 09/13/2017   Choledocholithiasis 06/29/2012   Formatting of this note might be different from the original. Dilated CBD on abdominal imaging 04/2012   Chronic diarrhea 02/16/2013   Colitis    Complication of anesthesia    asthma attack after shoulder surgery   Diabetes mellitus ([place])    type II   Esophagitis    GERD (gastroesophageal reflux disease)    Heart murmur    for years- nothing to be concerned about.   History of kidney stones    Hx of arteriovenous malformation (AVM)     Hypertension    IBS (irritable bowel syndrome)    Pernicious anemia    Personal history of colonic polyps 02/10/2013   02/08/13 colonoscopy - question of rectal varices, two 3-51mm polyps in the sigmoid colon, mass at the hepatic flexure, one 20mm polyp at the hepatic flexure, nodule at the ileocecal valve, congested mucusa in the entire colon, flattened villi mucosa in the terminal ileum.     Pneumonia    Psoriatic arthritis ([place])    s/p penicillamine, plaquenil, MTX, sulfasalazine.  s/p Embrel, Humira,.  Iritis.     Pure hypercholesterolemia    Rectal bleeding 07/15/2017     Past Surgical History: Past Surgical History:  Procedure Laterality Date   ABDOMINAL HYSTERECTOMY  1981   ovaries left in place   ANKLE SURGERY     right   BUNIONECTOMY     CHOLECYSTECTOMY  1989   COLONOSCOPY WITH PROPOFOL N/A 12/27/2017   Procedure: COLONOSCOPY WITH PROPOFOL;  Surgeon: Lin Landsman, MD;  Location: [facility] ENDOSCOPY;  Service: Gastroenterology;  Laterality: N/A;   COLONOSCOPY WITH PROPOFOL N/A 01/04/2019   Procedure: COLONOSCOPY WITH PROPOFOL;  Surgeon: Lin Landsman, MD;  Location: [place];  Service: Endoscopy;  Laterality: N/A;  Diabetic - oral and injectable   COLONOSCOPY WITH PROPOFOL N/A 07/24/2021   Procedure: COLONOSCOPY WITH PROPOFOL;  Surgeon: Lin Landsman, MD;  Location: [place];  Service: Endoscopy;  Laterality: N/A;  Diabetic   ESOPHAGOGASTRODUODENOSCOPY (EGD) WITH PROPOFOL N/A 12/27/2017  Procedure: ESOPHAGOGASTRODUODENOSCOPY (EGD) WITH PROPOFOL;  Surgeon: Lin Landsman, MD;  Location[facility] ENDOSCOPY;  Service: Gastroenterology;  Laterality: N/A;   HAND SURGERY     right   HEEL SPUR SURGERY     IR 3D INDEPENDENT WKST  10/01/2021   IR ANGIO INTRA EXTRACRAN SEL COM CAROTID INNOMINATE UNI L MOD SED  10/01/2021   IR ANGIO INTRA EXTRACRAN SEL INTERNAL CAROTID UNI R MOD SED  10/01/2021   IR ANGIO VERTEBRAL SEL VERTEBRAL BILAT MOD SED   10/01/2021   IR CT HEAD LTD  10/01/2021   IR TRANSCATH/EMBOLIZ  10/01/2021   IR US GUIDE VASC ACCESS RIGHT  10/01/2021   NOSE SURGERY     x2   POLYPECTOMY  01/04/2019   Procedure: POLYPECTOMY;  Surgeon: Lin Landsman, MD;  Location: [place];  Service: Endoscopy;;   POLYPECTOMY N/A 07/24/2021   Procedure: POLYPECTOMY;  Surgeon: Lin Landsman, MD;  Location: [place];  Service: Endoscopy;  Laterality: N/A;   RADIOLOGY WITH ANESTHESIA N/A 10/01/2021   Procedure: Starr Sinclair WITH ANESTHESIA;  Surgeon: Pedro Earls, MD;  Location: [place];  Service: Radiology;  Laterality: N/A;   SHOULDER ARTHROSCOPY WITH OPEN ROTATOR CUFF REPAIR Left 09/20/2018   Procedure: SHOULDER ARTHROSCOPY WITH OPEN ROTATOR CUFF REPAIR;  Surgeon: Corky Mull, MD;  Location: [facility];  Service: Orthopedics;  Laterality: Left;   SHOULDER CLOSED REDUCTION Left 12/21/2018   Procedure: MANIPULATION UNDER ANESTHESIA WITH STEROID INJECTION;  Surgeon: Corky Mull, MD;  Location: [place];  Service: Orthopedics;  Laterality: Left;  Diabetic - insulin and oral meds   UMBILICAL HERNIA REPAIR      Home Medications: Prior to Admission medications   Medication Sig Start Date End Date Taking? Authorizing Provider  acetaminophen (TYLENOL) 325 MG tablet Take 650 mg by mouth every 6 (six) hours as needed for moderate pain.   Yes [provider]  albuterol (PROVENTIL) (2.5 MG/3ML) 0.083% nebulizer solution Take 3 mLs (2.5 mg total) by nebulization every 4 (four) hours as needed for wheezing or shortness of breath. 04/02/21  Yes McLean-Scocuzza, Nino Glow, MD  amLODipine (NORVASC) 5 MG tablet TAKE 1 TABLET(5 MG) BY MOUTH TWICE DAILY 08/26/21  Yes Einar Pheasant, MD  budesonide-formoterol [facility]) 160-4.5 MCG/ACT inhaler Inhale 2 puffs into the lungs 2 (two) times daily. Patient taking differently: Inhale 2 puffs into the lungs 2 (two) times daily as needed (shortness of  breath or wheezing). 11/05/21  Yes Einar Pheasant, MD  Cholecalciferol (VITAMIN D-3 PO) Take 2,000 Units by mouth daily.   Yes [provider]  CREON 36000-114000 units CPEP capsule Take 36,000 Units by mouth 3 (three) times daily with meals. Also 1 capsule with evening snack 08/08/21  Yes [provider]  cyclobenzaprine (FLEXERIL) 10 MG tablet TAKE 1 TABLET BY MOUTH EVERY NIGHT AS NEEDED Patient taking differently: Take 10 mg by mouth at bedtime. 09/18/21  Yes Einar Pheasant, MD  diphenhydramine-acetaminophen (TYLENOL PM) 25-500 MG TABS tablet Take 2 tablets by mouth at bedtime.   Yes [provider]  insulin degludec (TRESIBA FLEXTOUCH) 100 UNIT/ML FlexTouch Pen Inject 14 Units into the skin daily. 11/05/21  Yes Einar Pheasant, MD  losartan (COZAAR) 100 MG tablet TAKE 1 TABLET BY MOUTH EVERY DAY 08/26/21  Yes Einar Pheasant, MD  Melatonin 10 MG TABS Take 10 mg by mouth at bedtime.   Yes [provider]  omeprazole (PRILOSEC) 20 MG capsule TAKE 1 CAPSULE(20 MG) BY MOUTH TWICE DAILY  06/27/21  Yes Einar Pheasant, MD  ONE TOUCH ULTRA TEST test strip PATIENT NEEDS NEW METER STRIPS AND LANCETS FOR ONE TOUCH. HER INSURANCE NO LONGER COVERS ACCUCHEK. 07/31/15  Yes Einar Pheasant, MD  rosuvastatin (CRESTOR) 10 MG tablet TAKE 1 TABLET(10 MG) BY MOUTH DAILY 08/26/21  Yes Einar Pheasant, MD  albuterol (VENTOLIN HFA) 108 (90 Base) MCG/ACT inhaler Inhale 2 puffs into the lungs every 6 (six) hours as needed for wheezing or shortness of breath.    [provider]    Allergies: Allergies  Allergen Reactions   Aspirin Other (See Comments)    Increased bleeding   Beta Adrenergic Blockers     3rd degree blockage    Lisinopril Cough   Mtx Support [Cobalamine Combinations] Other (See Comments)    Respiratory symptoms   Vasotec [Enalapril] Cough   Verapamil Other (See Comments)    Heart block   Voltaren [Diclofenac Sodium]     Pt unsure of reaction     Erythromycin Other (See Comments)    Gi intolerance   Indocin [Indomethacin]     Pt unsure of reaction    Jardiance [Empagliflozin] Other (See Comments)    Recurrent yeast infections, even with washout and rechallenge    Social History:  reports that she has never smoked. She has never used smokeless tobacco. She reports that she does not drink alcohol and does not use drugs.   Family History: Family History  Problem Relation Age of Onset   Diabetes Other    Hypertension Other    Breast cancer Neg Hx    Colon cancer Neg Hx     Review of Systems: Review of Systems  Constitutional:  Negative for chills and fever.  HENT:  Negative for hearing loss.   Respiratory:  Negative for shortness of breath.   Cardiovascular:  Negative for chest pain.  Gastrointestinal:  Positive for abdominal pain and diarrhea. Negative for nausea and vomiting.  Genitourinary:  Negative for dysuria.  Musculoskeletal:  Negative for myalgias.  Skin:  Negative for rash.  Neurological:  Positive for headaches. Negative for dizziness.  Psychiatric/Behavioral:  Negative for depression.    Physical Exam BP (!) 147/78    Pulse 77    Temp 98.3 F (36.8 C)    Ht 5\' 4"  (1.626 m)    Wt 111 lb 9.6 oz (50.6 kg)    SpO2 100%    BMI 19.16 kg/m  CONSTITUTIONAL: No acute distress, well-nourished HEENT:  Normocephalic, atraumatic, extraocular motion intact. NECK: Trachea is midline, and there is no jugular venous distension.  RESPIRATORY:  Lungs are clear, and breath sounds are equal bilaterally. Normal respiratory effort without pathologic use of accessory muscles. CARDIOVASCULAR: Heart is regular without murmurs, gallops, or rubs. GI: The abdomen is soft, nondistended, with minimal discomfort in the left groin.  Patient does have a reducible left inguinal hernia.  Her colon is palpable as the patient is very thin but do not think there is bowel protrusion through the hernia.  No palpable right inguinal hernia and no  palpable umbilical hernia. MUSCULOSKELETAL:  Normal muscle strength and tone in all four extremities.  No peripheral edema or cyanosis. SKIN: Skin turgor is normal. There are no pathologic skin lesions.  NEUROLOGIC:  Motor and sensation is grossly normal.  Cranial nerves are grossly intact. PSYCH:  Alert and oriented to person, place and time. Affect is normal.  Laboratory Analysis: Labs from 10/17/2021: Sodium 141, potassium 3.6, chloride 105, CO2 29, BUN 12, creatinine 0.96.  Total bilirubin 0.4, AST 17, ALT 11, alkaline phosphatase 74.  WBC 4.8, hemoglobin 10.5, hematocrit 32.2, platelets 290.  Imaging: CT scan abdomen/pelvis on 08/06/2021: IMPRESSION: 1. Signs of severe pancolitis identified with terminal ileitis. Favor inflammatory or infectious etiologies. Ulcerative colitis not excluded. No convincing evidence for pneumatosis, portal venous gas or abscess formation. 2. Status post cholecystectomy with mild intrahepatic biliary dilatation and increase caliber of the common bile duct. No signs of choledocholithiasis or mass.  U/S pelvis on 10/29/2021: IMPRESSION: Fat containing hernia in the left lower quadrant corresponding to the area of swelling.  Assessment and Plan: This is a 71 y.o. female with a left inguinal hernia.  - I personally viewed the patient's imaging studies.  On the CT scan that she had on 08/06/2021 there is no specific mention of an inguinal hernia, however I can see fat protrusion lateral to the left inferior epigastric vessels which would be consistent with a left inguinal hernia.  I do not see any other abdominal wall herniation or defects.  Discussed the findings on the imaging studies with her.  Patient is interested in surgical management of this.  Discussed with her the plan for a robotic assisted left inguinal hernia repair and reviewed the surgery at length with her including the risks of bleeding, infection, injury to surrounding structures, that this an  outpatient surgery, activity restrictions, postoperative pain control, and she is willing to proceed.  As a precaution, I will discuss also the CT scan with radiology to confirm that truly there is only a left inguinal hernia in this case and no other concerns and I will touch back with the patient if there is any other further issues.  In the meantime, we will get her prepared for surgery and we will send for both medical and neurosurgery clearance. - Tentatively for now, we will schedule her for 11/27/2021.  I spent 80 minutes dedicated to the care of this patient on the date of this encounter to include pre-visit review of records, face-to-face time with the patient discussing diagnosis and management, and any post-visit coordination of care.   Melvyn Neth, [facility]

## 2021-11-12 NOTE — Patient Instructions (Addendum)
We are going to speak with the Radiologist about your most recent CT scan. If they are unclear then we may need to get a pelvic CT scan done to better assess the area.  You have chose to have your hernia repaired. This will be done by Dr. Hampton Abbot at [facility].  We will send for clearance from your primary care doctor and your neurologist.   Please see your (blue) Pre-care information that you have been given today. Our surgery scheduler will call you to look at surgery dates and to go over information.   You will need to arrange to be out of work for 2 weeks and then return with a lifting restrictions for 4 more weeks. Please send any FMLA paperwork prior to surgery and we will fill this out and fax it back to your employer within 3 business days.  You may have a bruise in your groin and also swelling and brusing in your testicle area. You may use ice 4-5 times daily for 15-20 minutes each time. Make sure that you place a barrier between you and the ice pack. To decrease the swelling, you may roll up a bath towel and place it vertically in between your thighs with your testicles resting on the towel. You will want to keep this area elevated as much as possible for several days following surgery.    Inguinal Hernia, Adult Muscles help keep everything in the body in its proper place. But if a weak spot in the muscles develops, something can poke through. That is called a hernia. When this happens in the lower part of the belly (abdomen), it is called an inguinal hernia. (It takes its name from a part of the body in this region called the inguinal canal.) A weak spot in the wall of muscles lets some fat or part of the small intestine bulge through. An inguinal hernia can develop at any age. Men get them more often than women. CAUSES  In adults, an inguinal hernia develops over time. It can be triggered by: Suddenly straining the muscles of the lower abdomen. Lifting heavy objects. Straining to have a  bowel movement. Difficult bowel movements (constipation) can lead to this. Constant coughing. This may be caused by smoking or lung disease. Being overweight. Being pregnant. Working at a job that requires long periods of standing or heavy lifting. Having had an inguinal hernia before. One type can be an emergency situation. It is called a strangulated inguinal hernia. It develops if part of the small intestine slips through the weak spot and cannot get back into the abdomen. The blood supply can be cut off. If that happens, part of the intestine may die. This situation requires emergency surgery. SYMPTOMS  Often, a small inguinal hernia has no symptoms. It is found when a healthcare provider does a physical exam. Larger hernias usually have symptoms.  In adults, symptoms may include: A lump in the groin. This is easier to see when the person is standing. It might disappear when lying down. In men, a lump in the scrotum. Pain or burning in the groin. This occurs especially when lifting, straining or coughing. A dull ache or feeling of pressure in the groin. Signs of a strangulated hernia can include: A bulge in the groin that becomes very painful and tender to the touch. A bulge that turns red or purple. Fever, nausea and vomiting. Inability to have a bowel movement or to pass gas. DIAGNOSIS  To decide if you have an  inguinal hernia, a healthcare provider will probably do a physical examination. This will include asking questions about any symptoms you have noticed. The healthcare provider might feel the groin area and ask you to cough. If an inguinal hernia is felt, the healthcare provider may try to slide it back into the abdomen. Usually no other tests are needed. TREATMENT  Treatments can vary. The size of the hernia makes a difference. Options include: Watchful waiting. This is often suggested if the hernia is small and you have had no symptoms. No medical procedure will be done  unless symptoms develop. You will need to watch closely for symptoms. If any occur, contact your healthcare provider right away. Surgery. This is used if the hernia is larger or you have symptoms. Open surgery. This is usually an outpatient procedure (you will not stay overnight in a hospital). An cut (incision) is made through the skin in the groin. The hernia is put back inside the abdomen. The weak area in the muscles is then repaired by herniorrhaphy or hernioplasty. Herniorrhaphy: in this type of surgery, the weak muscles are sewn back together. Hernioplasty: a patch or mesh is used to close the weak area in the abdominal wall. Laparoscopy. In this procedure, a surgeon makes small incisions. A thin tube with a tiny video camera (called a laparoscope) is put into the abdomen. The surgeon repairs the hernia with mesh by looking with the video camera and using two long instruments. HOME CARE INSTRUCTIONS  After surgery to repair an inguinal hernia: You will need to take pain medicine prescribed by your healthcare provider. Follow all directions carefully. You will need to take care of the wound from the incision. Your activity will be restricted for awhile. This will probably include no heavy lifting for several weeks. You also should not do anything too active for a few weeks. When you can return to work will depend on the type of job that you have. During "watchful waiting" periods, you should: Maintain a healthy weight. Eat a diet high in fiber (fruits, vegetables and whole grains). Drink plenty of fluids to avoid constipation. This means drinking enough water and other liquids to keep your urine clear or pale yellow. Do not lift heavy objects. Do not stand for long periods of time. Quit smoking. This should keep you from developing a frequent cough. SEEK MEDICAL CARE IF:  A bulge develops in your groin area. You feel pain, a burning sensation or pressure in the groin. This might be worse if  you are lifting or straining. You develop a fever of more than 100.5 F (38.1 C). SEEK IMMEDIATE MEDICAL CARE IF:  Pain in the groin increases suddenly. A bulge in the groin gets bigger suddenly and does not go down. For men, there is sudden pain in the scrotum. Or, the size of the scrotum increases. A bulge in the groin area becomes red or purple and is painful to touch. You have nausea or vomiting that does not go away. You feel your heart beating much faster than normal. You cannot have a bowel movement or pass gas. You develop a fever of more than 102.0 F (38.9 C).   This information is not intended to replace advice given to you by your health care provider. Make sure you discuss any questions you have with your health care provider.   Document Released: 03/07/2009 Document Revised: 01/11/2012 Document Reviewed: 04/22/2015 Elsevier Interactive Patient Education [employer].

## 2021-11-12 NOTE — Progress Notes (Signed)
Request for medical clearance has been faxed to Dr Einar Pheasant. Request for Neurological clearance has been faxed to Dr Maudry Diego Leeanne Deed at [facility].

## 2021-11-12 NOTE — Telephone Encounter (Signed)
Outgoing call is made, left message for patient to call. Please inform patient of the following:  Pre-Admission date/time, COVID Testing date and Surgery date.  Surgery Date: 11/27/21 Preadmission Testing Date: 11/14/21 (phone 1p-5p) Covid Testing Date: Not needed.   Also patient will need to call at [phone], between 1-3:00pm the day before surgery, to find out what time to arrive for surgery.

## 2021-11-13 ENCOUNTER — Telehealth: Payer: Self-pay

## 2021-11-13 NOTE — Telephone Encounter (Signed)
LMTCB for CT results.

## 2021-11-13 NOTE — Telephone Encounter (Signed)
Patient calls back, she is now informed of all dates regarding her surgery and verbalized understanding.   

## 2021-11-14 ENCOUNTER — Other Ambulatory Visit: Payer: Self-pay

## 2021-11-14 ENCOUNTER — Encounter
Admission: RE | Admit: 2021-11-14 | Discharge: 2021-11-14 | Disposition: A | Discharge status: Home / Self Care | Payer: PPO | Source: Ambulatory Visit | Attending: Surgery | Admitting: Surgery

## 2021-11-14 NOTE — Progress Notes (Unsigned)
Neurology Clearance received from Dr Manuella Ghazi. Patient is cleared at low risk for surgery. Patient should have close monitoring to prevent hyper or hypotension.

## 2021-11-14 NOTE — Patient Instructions (Signed)
Your procedure is scheduled on: 11/27/21 Report to [facility]. To find out your arrival time please call [phone] between 1PM - 3PM on 11/26/21.  Remember: Instructions that are not followed completely may result in serious medical risk, up to and including death, or upon the discretion of your surgeon and anesthesiologist your surgery may need to be rescheduled.     _X__ 1. Do not eat food after midnight the night before your procedure.                 No gum chewing or hard candies. You may drink clear liquids up to 2 hours                 before you are scheduled to arrive for your surgery- DO not drink clear                 liquids within 2 hours of the start of your surgery.                 Clear Liquids include:  ). Diabetics water only  __X__2.  On the morning of surgery brush your teeth with toothpaste and water, you                 may rinse your mouth with mouthwash if you wish.  Do not swallow any              toothpaste of mouthwash.     _X__ 3.  No Alcohol for 24 hours before or after surgery.   _X__ 4.  Do Not Smoke or use e-cigarettes For 24 Hours Prior to Your Surgery.                 Do not use any chewable tobacco products for at least 6 hours prior to                 surgery.  ____  5.  Bring all medications with you on the day of surgery if instructed.   __X__  6.  Notify your doctor if there is any change in your medical condition      (cold, fever, infections).     Do not wear jewelry, make-up, hairpins, clips or nail polish. Do not wear lotions, powders, or perfumes.  Do not shave body hair 48 hours prior to surgery. Men may shave face and neck. Do not bring valuables to the hospital.    [facility] is not responsible for any belongings or valuables.  Contacts, dentures/partials or body piercings may not be worn into surgery. Bring a case for your contacts, glasses or hearing aids, a denture cup will be  supplied. Leave your suitcase in the car. After surgery it may be brought to your room. For patients admitted to the hospital, discharge time is determined by your treatment team.   Patients discharged the day of surgery will not be allowed to drive home.   Please read over the following fact sheets that you were given:   CHG soap  __X__ Take these medicines the morning of surgery with A SIP OF WATER:    1. amLODipine (NORVASC) 5 MG tablet  2. omeprazole (PRILOSEC) 20 MG capsule  3. rosuvastatin (CRESTOR) 10 MG tablet  4.  5.  6.  ____ Fleet Enema (as directed)   __X__ Use CHG Soap/SAGE wipes as directed  __X__ Use inhalers on the day of surgery   Use  your nebulizer and the Symbicort  ____ Stop metformin/Janumet/Farxiga 2 days prior to surgery    ____ Take 1/2 of usual insulin dose the night before surgery. No insulin the morning          of surgery.   ____ Stop Blood Thinners Coumadin/Plavix/Xarelto/Pleta/Pradaxa/Eliquis/Effient/Aspirin  on   Or contact your Surgeon, Cardiologist or Medical Doctor regarding  ability to stop your blood thinners  __X__ Stop Anti-inflammatories 7 days before surgery such as Advil, Ibuprofen, Motrin,  BC or Goodies Powder, Naprosyn, Naproxen, Aleve, Aspirin    __X__ Stop all herbal supplements, fish oil or vitamin E until after surgery.    ____ Bring C-Pap to the hospital.

## 2021-11-17 ENCOUNTER — Telehealth: Payer: Self-pay | Admitting: Internal Medicine

## 2021-11-17 NOTE — Telephone Encounter (Signed)
See result note message 

## 2021-11-17 NOTE — Telephone Encounter (Signed)
Patient called and was returning Ashley Gross phone call.

## 2021-11-18 ENCOUNTER — Other Ambulatory Visit (HOSPITAL_COMMUNITY): Payer: Self-pay | Admitting: Neuroradiology

## 2021-11-18 DIAGNOSIS — I671 Cerebral aneurysm, nonruptured: Secondary | ICD-10-CM

## 2021-11-19 ENCOUNTER — Telehealth: Payer: Self-pay | Admitting: Internal Medicine

## 2021-11-19 ENCOUNTER — Other Ambulatory Visit: Payer: Self-pay

## 2021-11-19 ENCOUNTER — Other Ambulatory Visit (HOSPITAL_COMMUNITY): Payer: Self-pay | Admitting: Neuroradiology

## 2021-11-19 ENCOUNTER — Ambulatory Visit (HOSPITAL_COMMUNITY)
Admission: RE | Admit: 2021-11-19 | Discharge: 2021-11-19 | Disposition: A | Discharge status: Home / Self Care | Payer: PPO | Source: Ambulatory Visit | Attending: Neuroradiology | Admitting: Neuroradiology

## 2021-11-19 DIAGNOSIS — I671 Cerebral aneurysm, nonruptured: Secondary | ICD-10-CM

## 2021-11-19 MED ORDER — METHYLPREDNISOLONE 4 MG PO TBPK: ORAL_TABLET | ORAL | 0 refills | Status: AC

## 2021-11-19 NOTE — Progress Notes (Signed)
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  A few weeks after intervention, she developed a right-sided persistent parietal headache.  The pain is usually mild but persistent and can occasionally be severe (7-8/10).  She takes Tylenol which helps but pain does not completely disappear.  Sometimes she feels a pulsating sensation in the back of her head.  The pain can be alleviated by lying down the side.  No other symptoms.  No focal neurological deficit.  She underwent a head CT on 11/11/2021 which was negative for any new abnormality.  Past Medical History:  Diagnosis Date   Abnormal liver function test 10/06/2012   Anemia, unspecified    Asthma    B12 deficiency 02/11/2019   Bleeding internal hemorrhoids 09/13/2017   Choledocholithiasis 06/29/2012   Formatting of this note might be different from the original. Dilated CBD on abdominal imaging 04/2012   Chronic diarrhea 02/16/2013   Colitis    Complication of anesthesia    asthma attack after shoulder surgery   Diabetes mellitus ([place])    type II   Esophagitis    GERD (gastroesophageal reflux disease)    Heart murmur    for years- nothing to be concerned about.   History of kidney stones    Hx of arteriovenous malformation (AVM)    Hypertension    IBS (irritable bowel syndrome)    Pernicious anemia    Personal history of colonic polyps 02/10/2013   02/08/13 colonoscopy - question of rectal varices, two 3-38mm polyps in the sigmoid colon, mass at the hepatic flexure, one 56mm polyp at the  hepatic flexure, nodule at the ileocecal valve, congested mucusa in the entire colon, flattened villi mucosa in the terminal ileum.     Pneumonia    Psoriatic arthritis ([place])    s/p penicillamine, plaquenil, MTX, sulfasalazine.  s/p Embrel, Humira,.  Iritis.     Pure hypercholesterolemia    Rectal bleeding 07/15/2017    Past Surgical History:  Procedure Laterality Date   ABDOMINAL HYSTERECTOMY  1981   ovaries left in place   ANKLE SURGERY     right   BUNIONECTOMY     CHOLECYSTECTOMY  1989   COLONOSCOPY WITH PROPOFOL N/A 12/27/2017   Procedure: COLONOSCOPY WITH PROPOFOL;  Surgeon: Lin Landsman, MD;  Location: [facility] ENDOSCOPY;  Service: Gastroenterology;  Laterality: N/A;   COLONOSCOPY WITH PROPOFOL N/A 01/04/2019   Procedure: COLONOSCOPY WITH PROPOFOL;  Surgeon: Lin Landsman, MD;  Location: [place];  Service: Endoscopy;  Laterality: N/A;  Diabetic - oral and injectable   COLONOSCOPY WITH PROPOFOL N/A 07/24/2021   Procedure: COLONOSCOPY WITH PROPOFOL;  Surgeon: Lin Landsman, MD;  Location: [place];  Service: Endoscopy;  Laterality: N/A;  Diabetic   ESOPHAGOGASTRODUODENOSCOPY (EGD) WITH PROPOFOL N/A 12/27/2017   Procedure: ESOPHAGOGASTRODUODENOSCOPY (EGD) WITH PROPOFOL;  Surgeon: Lin Landsman, MD;  [facility] ENDOSCOPY;  Service: Gastroenterology;  Laterality: N/A;   HAND SURGERY     right   HEEL SPUR SURGERY     IR 3D INDEPENDENT WKST  10/01/2021   IR ANGIO INTRA EXTRACRAN SEL COM CAROTID INNOMINATE UNI L  MOD SED  10/01/2021   IR ANGIO INTRA EXTRACRAN SEL INTERNAL CAROTID UNI R MOD SED  10/01/2021   IR ANGIO VERTEBRAL SEL VERTEBRAL BILAT MOD SED  10/01/2021   IR CT HEAD LTD  10/01/2021   IR TRANSCATH/EMBOLIZ  10/01/2021   IR US GUIDE VASC ACCESS RIGHT  10/01/2021   NOSE SURGERY     x2   POLYPECTOMY  01/04/2019   Procedure: POLYPECTOMY;  Surgeon: Lin Landsman, MD;  Location: [place];  Service: Endoscopy;;    POLYPECTOMY N/A 07/24/2021   Procedure: POLYPECTOMY;  Surgeon: Lin Landsman, MD;  Location: [place];  Service: Endoscopy;  Laterality: N/A;   RADIOLOGY WITH ANESTHESIA N/A 10/01/2021   Procedure: Starr Sinclair WITH ANESTHESIA;  Surgeon: Pedro Earls, MD;  Location: [place];  Service: Radiology;  Laterality: N/A;   SHOULDER ARTHROSCOPY WITH OPEN ROTATOR CUFF REPAIR Left 09/20/2018   Procedure: SHOULDER ARTHROSCOPY WITH OPEN ROTATOR CUFF REPAIR;  Surgeon: Corky Mull, MD;  Location: [facility];  Service: Orthopedics;  Laterality: Left;   SHOULDER CLOSED REDUCTION Left 12/21/2018   Procedure: MANIPULATION UNDER ANESTHESIA WITH STEROID INJECTION;  Surgeon: Corky Mull, MD;  Location: [place];  Service: Orthopedics;  Laterality: Left;  Diabetic - insulin and oral meds   UMBILICAL HERNIA REPAIR      Allergies: Aspirin, Beta adrenergic blockers, Lisinopril, Mtx support [cobalamine combinations], Vasotec [enalapril], Verapamil, Voltaren [diclofenac sodium], Erythromycin, Indocin [indomethacin], and Jardiance [empagliflozin]  Medications: Prior to Admission medications   Medication Sig Start Date End Date Taking? Authorizing Provider  acetaminophen (TYLENOL) 325 MG tablet Take 650 mg by mouth every 6 (six) hours as needed for moderate pain.    [provider]  albuterol (PROVENTIL) (2.5 MG/3ML) 0.083% nebulizer solution Take 3 mLs (2.5 mg total) by nebulization every 4 (four) hours as needed for wheezing or shortness of breath. 04/02/21   McLean-Scocuzza, Nino Glow, MD  albuterol (VENTOLIN HFA) 108 (90 Base) MCG/ACT inhaler Inhale 2 puffs into the lungs every 6 (six) hours as needed for wheezing or shortness of breath.    [provider]  amLODipine (NORVASC) 5 MG tablet TAKE 1 TABLET(5 MG) BY MOUTH TWICE DAILY 08/26/21   Einar Pheasant, MD  budesonide-formoterol [facility]) 160-4.5 MCG/ACT inhaler Inhale 2 puffs into the lungs 2 (two) times  daily. Patient taking differently: Inhale 2 puffs into the lungs 2 (two) times daily as needed (shortness of breath or wheezing). 11/05/21   Einar Pheasant, MD  Cholecalciferol (VITAMIN D-3 PO) Take 2,000 Units by mouth daily.    [provider]  CREON 36000-114000 units CPEP capsule Take 36,000 Units by mouth 3 (three) times daily with meals. Also 1 capsule with evening snack 08/08/21   [provider]  cyclobenzaprine (FLEXERIL) 10 MG tablet TAKE 1 TABLET BY MOUTH EVERY NIGHT AS NEEDED Patient taking differently: Take 10 mg by mouth at bedtime. 09/18/21   Einar Pheasant, MD  diphenhydramine-acetaminophen (TYLENOL PM) 25-500 MG TABS tablet Take 2 tablets by mouth at bedtime.    [provider]  insulin degludec (TRESIBA FLEXTOUCH) 100 UNIT/ML FlexTouch Pen Inject 14 Units into the skin daily. 11/05/21   Einar Pheasant, MD  losartan (COZAAR) 100 MG tablet TAKE 1 TABLET BY MOUTH EVERY DAY 08/26/21   Einar Pheasant, MD  Melatonin 10 MG TABS Take 10 mg by mouth at bedtime.    [provider]  methylPREDNISolone (MEDROL DOSEPAK) 4 MG TBPK tablet Day 1: 8 mg PO before breakfast, 4  mg after lunch and after dinner, and 8 mg at bedtime Day 2: 4 mg PO before breakfast, after lunch, and after dinner and 8 mg at bedtime Day 3: 4 mg PO before breakfast, after lunch, after dinner, and at bedtime Day 4: 4 mg PO before breakfast, after lunch, and at bedtime Day 5: 4 mg PO before breakfast and at bedtime Day 6: 4 mg PO before breakfast 11/20/21 11/25/21  de Rosario Jacks, MD  omeprazole (PRILOSEC) 20 MG capsule TAKE 1 CAPSULE(20 MG) BY MOUTH TWICE DAILY 06/27/21   Einar Pheasant, MD  ONE TOUCH ULTRA TEST test strip PATIENT NEEDS NEW METER STRIPS AND LANCETS FOR ONE TOUCH. HER INSURANCE NO LONGER COVERS ACCUCHEK. 07/31/15   Einar Pheasant, MD  rosuvastatin (CRESTOR) 10 MG tablet TAKE 1 TABLET(10 MG) BY MOUTH DAILY 08/26/21   Einar Pheasant, MD     Family History   Problem Relation Age of Onset   Diabetes Other    Hypertension Other    Breast cancer Neg Hx    Colon cancer Neg Hx     Social History   Socioeconomic History   Marital status: Widowed    Spouse name: Not on file   Number of children: 2   Years of education: Not on file   Highest education level: Not on file  Occupational History   Not on file  Tobacco Use   Smoking status: Never   Smokeless tobacco: Never  Vaping Use   Vaping Use: Never used  Substance and Sexual Activity   Alcohol use: Never   Drug use: Never   Sexual activity: Not Currently  Other Topics Concern   Not on file  Social History Narrative   She is married and has two children   Social Determinants of Health   Financial Resource Strain: Medium Risk   Difficulty of Paying Living Expenses: Somewhat hard  Food Insecurity: No Food Insecurity   Worried About Charity fundraiser in the Last Year: Never true   [place] in the Last Year: Never true  Transportation Needs: No Transportation Needs   Lack of Transportation (Medical): No   Lack of Transportation (Non-Medical): No  Physical Activity: Not on file  Stress: No Stress Concern Present   Feeling of Stress : Only a little  Social Connections: Unknown   Frequency of Communication with Friends and Family: More than three times a week   Frequency of Social Gatherings with Friends and Family: Not on file   Attends Religious Services: Not on file   Active Member of Clubs or Organizations: Not on file   Attends Archivist Meetings: Not on file   Marital Status: Not on file     Vital Signs: There were no vitals taken for this visit.  Physical Exam Constitutional:      Appearance: Normal appearance.  Eyes:     Extraocular Movements: Extraocular movements intact.     Conjunctiva/sclera: Conjunctivae normal.     Pupils: Pupils are equal, round, and reactive to light.  Neurological:     Mental Status: She is alert and oriented to  person, place, and time.     Cranial Nerves: Cranial nerves 2-12 are intact.     Sensory: Sensation is intact.     Motor: Motor function is intact.     Gait: Gait is intact.    Imaging: No results found.  Labs:  CBC: Recent Labs    03/21/21 1038 04/09/21 1341 10/01/21 0786 10/17/21 7544  WBC 6.2 11.1* 4.4 4.8  HGB 11.4* 11.6* 10.8* 10.5*  HCT 34.9* 35.5* 33.5* 32.2*  PLT 277.0 397.0 256 290.0    COAGS: Recent Labs    10/01/21 0635  INR 1.0  APTT 23*    BMP: Recent Labs    03/21/21 1038 04/09/21 1341 04/22/21 1401 07/17/21 1635 10/01/21 0635 10/17/21 0838  NA 142  --   --   --  139 141  K 3.4* 3.4* 3.8  --  3.7 3.6  CL 104  --   --   --  105 105  CO2 30  --   --   --  26 29  GLUCOSE 52*  --   --   --  114* 118*  BUN 10  --   --  15 13 12   CALCIUM 8.3*  --   --   --  8.6* 8.6  CREATININE 0.92  --   --  1.04* 0.87 0.96  GFRNONAA  --   --   --   --  >60  --     LIVER FUNCTION TESTS: Recent Labs    03/21/21 1038 10/17/21 0838  BILITOT 0.5 0.4  AST 18 17  ALT 12 11  ALKPHOS 84 74  PROT 5.4* 5.5*  ALBUMIN 3.5 3.6    Assessment:  Ashley Gross is a pleasant 71 year old female with recent 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  She complains of persistent headaches without focal neurological deficit.  Given that the headaches are interfering in the quality of her daily life, I will prescribe a Medrol dosepak considering the possibility of local inflammatory changes which has been described after endovascular treatment of aneurysms.  She was instructed to contact us with any concerns.  We will see her in follow-up at 6 month post intervention or earlier if necessary.  Signed: Pedro Earls, MD 11/19/2021, 2:44 PM    I spent a total of    25 Minutes in face to face in clinical consultation, greater than 50% of which was counseling/coordinating care for headaches post endovascular  aneurysm treatment.

## 2021-11-19 NOTE — Telephone Encounter (Signed)
-----   Message from Pedro Earls, MD sent at 11/18/2021  8:32 AM EST ----- Regarding: RE: qeustion  Hello,  Thanks for letting me know. This result did not show up in my inbox. There are no unexpected findings on CT that would raise concern. No specific treatment required, just painkillers would be enough for the headache.  Thanks, Erven Colla   ----- Message ----- From: Einar Pheasant, MD Sent: 11/18/2021   4:01 AM EST To: Pedro Earls, MD Subject: qeustion                                       I received a copy of Ms Erny' CT scan.  It appears that you ordered this in follow up - given her persistent head pain she is experiencing.  I was not sure if you had a chance to review the scan and if you have any further recommendations.  Thank you for seeing her and helping to take care of her.    Einar Pheasant

## 2021-11-19 NOTE — Telephone Encounter (Signed)
Had f/u appt 11/19/21.  See note.

## 2021-11-26 ENCOUNTER — Other Ambulatory Visit: Payer: Self-pay

## 2021-11-26 ENCOUNTER — Ambulatory Visit (INDEPENDENT_AMBULATORY_CARE_PROVIDER_SITE_OTHER): Payer: PPO | Admitting: Internal Medicine

## 2021-11-26 ENCOUNTER — Telehealth: Payer: Self-pay | Admitting: Surgery

## 2021-11-26 VITALS — BP 112/68 | HR 90 | Temp 97.9°F | Resp 16 | Ht 64.0 in | Wt 110.0 lb

## 2021-11-26 DIAGNOSIS — J4520 Mild intermittent asthma, uncomplicated: Secondary | ICD-10-CM | POA: Diagnosis not present

## 2021-11-26 DIAGNOSIS — I10 Essential (primary) hypertension: Secondary | ICD-10-CM

## 2021-11-26 DIAGNOSIS — Z01811 Encounter for preprocedural respiratory examination: Secondary | ICD-10-CM

## 2021-11-26 DIAGNOSIS — K219 Gastro-esophageal reflux disease without esophagitis: Secondary | ICD-10-CM | POA: Diagnosis not present

## 2021-11-26 DIAGNOSIS — K4090 Unilateral inguinal hernia, without obstruction or gangrene, not specified as recurrent: Secondary | ICD-10-CM

## 2021-11-26 DIAGNOSIS — L4050 Arthropathic psoriasis, unspecified: Secondary | ICD-10-CM

## 2021-11-26 DIAGNOSIS — D329 Benign neoplasm of meninges, unspecified: Secondary | ICD-10-CM

## 2021-11-26 DIAGNOSIS — K51419 Inflammatory polyps of colon with unspecified complications: Secondary | ICD-10-CM | POA: Diagnosis not present

## 2021-11-26 DIAGNOSIS — E118 Type 2 diabetes mellitus with unspecified complications: Secondary | ICD-10-CM | POA: Diagnosis not present

## 2021-11-26 DIAGNOSIS — E237 Disorder of pituitary gland, unspecified: Secondary | ICD-10-CM | POA: Diagnosis not present

## 2021-11-26 DIAGNOSIS — R634 Abnormal weight loss: Secondary | ICD-10-CM

## 2021-11-26 DIAGNOSIS — Z01818 Encounter for other preprocedural examination: Secondary | ICD-10-CM | POA: Insufficient documentation

## 2021-11-26 DIAGNOSIS — F439 Reaction to severe stress, unspecified: Secondary | ICD-10-CM

## 2021-11-26 DIAGNOSIS — I671 Cerebral aneurysm, nonruptured: Secondary | ICD-10-CM | POA: Diagnosis not present

## 2021-11-26 DIAGNOSIS — E7800 Pure hypercholesterolemia, unspecified: Secondary | ICD-10-CM

## 2021-11-26 DIAGNOSIS — Z794 Long term (current) use of insulin: Secondary | ICD-10-CM

## 2021-11-26 NOTE — Progress Notes (Addendum)
Patient ID: Ashley Gross, female   DOB: 09/02/1951, 71 y.o.   MRN: [id number]   Subjective:    Patient ID: Ashley Gross, female    DOB: Nov 22, 1950, 71 y.o.   MRN: [id number]  This visit occurred during the SARS-CoV-2 public health emergency.  Safety protocols were in place, including screening questions prior to the visit, additional usage of staff PPE, and extensive cleaning of exam room while observing appropriate contact time as indicated for disinfecting solutions.   Patient here for pre op evaluation.   Chief Complaint  Patient presents with   Pre-op Exam   .   HPI Presents for pre op evaluation - planning for inguinal hernia repair.  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  Is S/p diagnostic cerebral angiogram with endovascular aneurysm embolization. Was having intermittent pain - head.  Had f/u with interventional radiology.  CT ok.  Head pain better.  She has a left inguinal hernia.  Planning for inguinal hernia repair.  On questioning her, she reports no chest pain.  Does report she has noticed increased heart rate - described as pounding - fast heart beat - when she gets up from lying down to go to the bathroom.  May last for 10-15 minutes and then resolves.  Feels has worsened over the last 1-2 weeks.  No increased cough or congestion.  Breathing overall stable.  No acid reflux reported. Bowels relatively stable.      Past Medical History:  Diagnosis Date   Abnormal liver function test 10/06/2012   Anemia, unspecified    Asthma    B12 deficiency 02/11/2019   Bleeding internal hemorrhoids 09/13/2017   Choledocholithiasis 06/29/2012   Formatting of this note might be different from the original. Dilated CBD on abdominal imaging 04/2012   Chronic diarrhea 02/16/2013   Colitis    Complication of anesthesia    asthma attack after shoulder surgery   Diabetes mellitus ([place]    type II   Esophagitis    GERD (gastroesophageal reflux disease)     Heart murmur    for years- nothing to be concerned about.   History of kidney stones    Hx of arteriovenous malformation (AVM)    Hypertension    IBS (irritable bowel syndrome)    Pernicious anemia    Personal history of colonic polyps 02/10/2013   02/08/13 colonoscopy - question of rectal varices, two 3-884mpolyps in the sigmoid colon, mass at the hepatic flexure, one 48m44molyp at the hepatic flexure, nodule at the ileocecal valve, congested mucusa in the entire colon, flattened villi mucosa in the terminal ileum.     Pneumonia    Psoriatic arthritis ([place]  s/p penicillamine, plaquenil, MTX, sulfasalazine.  s/p Embrel, Humira,.  Iritis.     Pure hypercholesterolemia    Rectal bleeding 07/15/2017   Past Surgical History:  Procedure Laterality Date   ABDOMINAL HYSTERECTOMY  1981   ovaries left in place   ANKLE SURGERY     right   BUNIONECTOMY     CHOLECYSTECTOMY  1989   COLONOSCOPY WITH PROPOFOL N/A 12/27/2017   Procedure: COLONOSCOPY WITH PROPOFOL;  Surgeon: VanLin LandsmanD;  [facility];  Service: Gastroenterology;  Laterality: N/A;   COLONOSCOPY WITH PROPOFOL N/A 01/04/2019   Procedure: COLONOSCOPY WITH PROPOFOL;  Surgeon: VanLin LandsmanD;  [facility];  Laterality: N/A;  Diabetic - oral and injectable   COLONOSCOPY WITH PROPOFOL N/A 07/24/2021  Procedure: COLONOSCOPY WITH PROPOFOL;  Surgeon: Lin Landsman, MD;  Location: [place];  Service: Endoscopy;  Laterality: N/A;  Diabetic   ESOPHAGOGASTRODUODENOSCOPY (EGD) WITH PROPOFOL N/A 12/27/2017   Procedure: ESOPHAGOGASTRODUODENOSCOPY (EGD) WITH PROPOFOL;  Surgeon: Lin Landsman, MD;  [facility];  Service: Gastroenterology;  Laterality: N/A;   HAND SURGERY     right   HEEL SPUR SURGERY     IR 3D INDEPENDENT WKST  10/01/2021   IR ANGIO INTRA EXTRACRAN SEL COM CAROTID INNOMINATE UNI L MOD SED  10/01/2021   IR ANGIO INTRA EXTRACRAN SEL  INTERNAL CAROTID UNI R MOD SED  10/01/2021   IR ANGIO VERTEBRAL SEL VERTEBRAL BILAT MOD SED  10/01/2021   IR CT HEAD LTD  10/01/2021   IR TRANSCATH/EMBOLIZ  10/01/2021   IR US GUIDE VASC ACCESS RIGHT  10/01/2021   NOSE SURGERY     x2   POLYPECTOMY  01/04/2019   Procedure: POLYPECTOMY;  Surgeon: Lin Landsman, MD;  Location: [place];  Service: Endoscopy;;   POLYPECTOMY N/A 07/24/2021   Procedure: POLYPECTOMY;  Surgeon: Lin Landsman, MD;  Location: [place];  Service: Endoscopy;  Laterality: N/A;   RADIOLOGY WITH ANESTHESIA N/A 10/01/2021   Procedure: Starr Sinclair WITH ANESTHESIA;  Surgeon: Pedro Earls, MD;  Location: [place];  Service: Radiology;  Laterality: N/A;   SHOULDER ARTHROSCOPY WITH OPEN ROTATOR CUFF REPAIR Left 09/20/2018   Procedure: SHOULDER ARTHROSCOPY WITH OPEN ROTATOR CUFF REPAIR;  Surgeon: Corky Mull, MD;  Location: [facility]S;  Service: Orthopedics;  Laterality: Left;   SHOULDER CLOSED REDUCTION Left 12/21/2018   Procedure: MANIPULATION UNDER ANESTHESIA WITH STEROID INJECTION;  Surgeon: Corky Mull, MD;  Location: [place];  Service: Orthopedics;  Laterality: Left;  Diabetic - insulin and oral meds   UMBILICAL HERNIA REPAIR     Family History  Problem Relation Age of Onset   Diabetes Other    Hypertension Other    Breast cancer Neg Hx    Colon cancer Neg Hx    Social History   Socioeconomic History   Marital status: Widowed    Spouse name: Not on file   Number of children: 2   Years of education: Not on file   Highest education level: Not on file  Occupational History   Not on file  Tobacco Use   Smoking status: Never   Smokeless tobacco: Never  Vaping Use   Vaping Use: Never used  Substance and Sexual Activity   Alcohol use: Never   Drug use: Never   Sexual activity: Not Currently  Other Topics Concern   Not on file  Social History Narrative   She is married and has two children   Social  Determinants of Health   Financial Resource Strain: Medium Risk   Difficulty of Paying Living Expenses: Somewhat hard  Food Insecurity: No Food Insecurity   Worried About Charity fundraiser in the Last Year: Never true   [place] in the Last Year: Never true  Transportation Needs: No Transportation Needs   Lack of Transportation (Medical): No   Lack of Transportation (Non-Medical): No  Physical Activity: Not on file  Stress: No Stress Concern Present   Feeling of Stress : Only a little  Social Connections: Unknown   Frequency of Communication with Friends and Family: More than three times a week   Frequency of Social Gatherings with Friends and Family: Not on file   Attends Religious  Services: Not on file   Active Member of Clubs or Organizations: Not on file   Attends Club or Organization Meetings: Not on file   Marital Status: Not on file     Review of Systems  Constitutional:  Negative for appetite change and unexpected weight change.  HENT:  Negative for congestion and sinus pressure.   Respiratory:  Negative for cough, chest tightness and shortness of breath.   Cardiovascular:  Negative for chest pain and leg swelling.       Episodes of increased heart rate as outlined.    Gastrointestinal:  Negative for nausea and vomiting.       Discomfort - left lower quadrant - left inguinal hernia.   Genitourinary:  Negative for difficulty urinating and dysuria.  Musculoskeletal:  Negative for joint swelling and myalgias.  Skin:  Negative for color change and rash.  Neurological:  Negative for dizziness, light-headedness and headaches.  Psychiatric/Behavioral:  Negative for agitation and dysphoric mood.       Objective:     BP 112/68    Pulse 90    Temp 97.9 F (36.6 C)    Resp 16    Ht _0  (1.626 m)    Wt 110 lb (49.9 kg)    SpO2 98%    BMI 18.88 kg/m  Wt Readings from Last 3 Encounters:  11/26/21 110 lb (49.9 kg)  11/14/21 111 lb (50.3 kg)  11/12/21 111 lb 9.6 oz  (50.6 kg)    Physical Exam Vitals reviewed.  Constitutional:      General: She is not in acute distress.    Appearance: Normal appearance.  HENT:     Head: Normocephalic and atraumatic.     Right Ear: External ear normal.     Left Ear: External ear normal.  Eyes:     General: No scleral icterus.       Right eye: No discharge.        Left eye: No discharge.     Conjunctiva/sclera: Conjunctivae normal.  Neck:     Thyroid: No thyromegaly.  Cardiovascular:     Rate and Rhythm: Normal rate and regular rhythm.  Pulmonary:     Effort: No respiratory distress.     Breath sounds: Normal breath sounds. No wheezing.  Abdominal:     General: Bowel sounds are normal.     Palpations: Abdomen is soft.     Comments: Minimal tenderness to palpation - left lower quadrant - inguinal hernia.  (Easily reducible).   Musculoskeletal:        General: No swelling or tenderness.     Cervical back: Neck supple. No tenderness.  Lymphadenopathy:     Cervical: No cervical adenopathy.  Skin:    Findings: No erythema or rash.  Neurological:     Mental Status: She is alert.  Psychiatric:        Mood and Affect: Mood normal.        Behavior: Behavior normal.     Outpatient Encounter Medications as of 11/26/2021  Medication Sig   acetaminophen (TYLENOL) 325 MG tablet Take 650 mg by mouth every 6 (six) hours as needed for moderate pain.   albuterol (PROVENTIL) (2.5 MG/3ML) 0.083% nebulizer solution Take 3 mLs (2.5 mg total) by nebulization every 4 (four) hours as needed for wheezing or shortness of breath.   albuterol (VENTOLIN HFA) 108 (90 Base) MCG/ACT inhaler Inhale 2 puffs into the lungs every 6 (six) hours as needed for wheezing or shortness of breath.  amLODipine (NORVASC) 5 MG tablet TAKE 1 TABLET(5 MG) BY MOUTH TWICE DAILY   budesonide-formoterol (SYMBICORT) 160-4.5 MCG/ACT inhaler Inhale 2 puffs into the lungs 2 (two) times daily. (Patient taking differently: Inhale 2 puffs into the lungs 2  (two) times daily as needed (shortness of breath or wheezing).)   Cholecalciferol (VITAMIN D-3 PO) Take 2,000 Units by mouth daily.   CREON 36000-114000 units CPEP capsule Take 36,000 Units by mouth 3 (three) times daily with meals. Also 1 capsule with evening snack   cyclobenzaprine (FLEXERIL) 10 MG tablet TAKE 1 TABLET BY MOUTH EVERY NIGHT AS NEEDED (Patient taking differently: Take 10 mg by mouth at bedtime.)   diphenhydramine-acetaminophen (TYLENOL PM) 25-500 MG TABS tablet Take 2 tablets by mouth at bedtime.   insulin degludec (TRESIBA FLEXTOUCH) 100 UNIT/ML FlexTouch Pen Inject 14 Units into the skin daily.   losartan (COZAAR) 100 MG tablet TAKE 1 TABLET BY MOUTH EVERY DAY   Melatonin 10 MG TABS Take 10 mg by mouth at bedtime.   omeprazole (PRILOSEC) 20 MG capsule TAKE 1 CAPSULE(20 MG) BY MOUTH TWICE DAILY   ONE TOUCH ULTRA TEST test strip PATIENT NEEDS NEW METER STRIPS AND LANCETS FOR ONE TOUCH. HER INSURANCE NO LONGER COVERS ACCUCHEK.   rosuvastatin (CRESTOR) 10 MG tablet TAKE 1 TABLET(10 MG) BY MOUTH DAILY   No facility-administered encounter medications on file as of 11/26/2021.     Lab Results  Component Value Date   WBC 4.8 10/17/2021   HGB 10.5 (L) 10/17/2021   HCT 32.2 (L) 10/17/2021   PLT 290.0 10/17/2021   GLUCOSE 118 (H) 10/17/2021   CHOL 135 10/17/2021   TRIG 91.0 10/17/2021   HDL 53.80 10/17/2021   LDLDIRECT 74.0 04/19/2019   LDLCALC 63 10/17/2021   ALT 11 10/17/2021   AST 17 10/17/2021   NA 141 10/17/2021   K 3.6 10/17/2021   CL 105 10/17/2021   CREATININE 0.96 10/17/2021   BUN 12 10/17/2021   CO2 29 10/17/2021   TSH 3.11 10/17/2021   INR 1.0 10/01/2021   HGBA1C 5.4 10/17/2021   MICROALBUR 2.0 (H) 11/13/2020    No results found.     Assessment & Plan:   Problem List Items Addressed This Visit     Aneurysm of anterior communicating artery     MRI - incidental finding of a 37m anterior communicating artery aneurysm.  S/p diagnostic cerebral angiogram  with endovascular aneurysm embolization.  Recommended 83maspirin for one week. She has been off aspirin.  She did well with the procedure.  Described head pain as outlined.  CT ok.  Better now.  Discussed with Dr DePatrecia Pour No contraindication (regarding her procedure) to proceed with planned surgery.         Asthma    Breathing is stable.  Will need to continue to use inhalers.  Use day of surgery and peri op period.        Controlled type 2 diabetes mellitus with complication, with long-term current use of insulin ([place]   Has lost a lot of weight.  On no medication now.  Follow met b and a1c.        GERD (gastroesophageal reflux disease)    No upper symptoms.  On omeprazole.        Hypercholesterolemia    Continue crestor. Follow lipid panel and liver function tests.        Hypertension    Blood pressure as outlined.  On amlodipine and losartan.   Follow pressures.  Follow metabolic panel.  Blood pressure controlled.  Will need close intra op and post op monitoring of heart rate and blood pressure to avoid extremes.        Inflammatory polyps of colon ([place])    S/p colonoscopy 07/24/21 as outlined.  Continues f/u with GI.       Inguinal hernia    Planning for left inguinal hernia repair.  Seeing surgery.       Loss of weight    Has had extensive GI w/up as outlined.  Continue to encourage increased po intake.  Follow.        Meningioma [facility])    Saw neurology.  Recommended following.  Has planned f/u 12/2021.        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  Seeing neurology.  Per neurology - ok from their standpoint for surgery.       Pre-op evaluation    Here for pre op evaluation.  Describes the increased heart rate with minimal exertion - getting up from lying in bed and walking to the bathroom.  Feels has worsened in the last 1-2 weeks.  EKG - SR with no acute ischemic changes noted.  Given worsening symptoms with minimal exertion, we discussed having cardiology evaluate  prior to her planned elective surgery with question of need for any further cardiac w/up (echo, monitor, etc).  Pt in agreement.  appt made for Friay 11/28/21 with Dr Clayborn Bigness.        Relevant Orders   EKG 12-Lead (Completed)   EKG 12-Lead   Psoriatic arthritis (HCC)    Stable.  Followed by Dr Jefm Bryant.       Stress    Increased stress.  Overall appears to be relatively stable currently.  Follow.  Has good support.       Other Visit Diagnoses     Pre-op chest exam    -  Primary   Relevant Orders   Ambulatory referral to Cardiology        Einar Pheasant, MD

## 2021-11-26 NOTE — Telephone Encounter (Signed)
I just called the OR and had them to cancel surgery for now.  Thank you.

## 2021-11-26 NOTE — Telephone Encounter (Signed)
Dr. Einar Pheasant calls office at this time patient in office and is currently having tachycardia and suggest patient sees cardiologist and that patient surgery be cancelled. Patient had an EKG done with pcp and patient is seeing the cardiologist Friday morning and weill be seeing Dr. Clayborn Bigness.

## 2021-11-27 ENCOUNTER — Telehealth: Payer: Self-pay | Admitting: Pharmacy Technician

## 2021-11-27 ENCOUNTER — Encounter: Admission: RE | Payer: Self-pay | Source: Home / Self Care

## 2021-11-27 ENCOUNTER — Ambulatory Visit: Admission: RE | Admit: 2021-11-27 | Payer: PPO | Source: Home / Self Care | Admitting: Surgery

## 2021-11-27 DIAGNOSIS — Z596 Low income: Secondary | ICD-10-CM

## 2021-11-27 SURGERY — HERNIORRHAPHY, INGUINAL, ROBOT-ASSISTED, LAPAROSCOPIC
Anesthesia: General | Laterality: Left

## 2021-11-27 NOTE — Progress Notes (Signed)
[facility])                                            [place] Team    11/27/2021  Ashley Gross 06/29/1951 [id number]  Care coordination call placed to [employer] in regard to Symbicort application.  Spoke to [facility] who informs patient has been APPROVED 11/02/21-11/01/22. She informs she refills will automatically be sent out based on last fill date in 2022 and going forward with delivery to the patient's home.  [name] P. [name], [place]  [phone]

## 2021-11-28 ENCOUNTER — Telehealth: Payer: Self-pay | Admitting: Internal Medicine

## 2021-11-28 ENCOUNTER — Encounter: Payer: Self-pay | Admitting: Internal Medicine

## 2021-11-28 DIAGNOSIS — Z01818 Encounter for other preprocedural examination: Secondary | ICD-10-CM | POA: Diagnosis not present

## 2021-11-28 DIAGNOSIS — E119 Type 2 diabetes mellitus without complications: Secondary | ICD-10-CM | POA: Diagnosis not present

## 2021-11-28 DIAGNOSIS — I10 Essential (primary) hypertension: Secondary | ICD-10-CM | POA: Diagnosis not present

## 2021-11-28 DIAGNOSIS — J45909 Unspecified asthma, uncomplicated: Secondary | ICD-10-CM | POA: Diagnosis not present

## 2021-11-28 DIAGNOSIS — E782 Mixed hyperlipidemia: Secondary | ICD-10-CM | POA: Diagnosis not present

## 2021-11-28 NOTE — Assessment & Plan Note (Signed)
Saw neurology.  Recommended following.  Has planned f/u 12/2021.

## 2021-11-28 NOTE — Addendum Note (Signed)
Addended by: Alisa Graff on: 11/28/2021 07:40 AM   Modules accepted: Orders

## 2021-11-28 NOTE — Assessment & Plan Note (Signed)
S/p colonoscopy 07/24/21 as outlined.  Continues f/u with GI.

## 2021-11-28 NOTE — Assessment & Plan Note (Signed)
Planning for left inguinal hernia repair.  Seeing surgery.

## 2021-11-28 NOTE — Assessment & Plan Note (Signed)
Has lost a lot of weight.  On no medication now.  Follow met b and a1c.   ?

## 2021-11-28 NOTE — Telephone Encounter (Signed)
Patient was seen in office 1/25 with PCP. Patient states she discussed about how her grief therapist recommended she get a service dog to help her since her husband has passed. Patient states her apartments she is currently living in requires a $300 for pets however since it is a service animal they have a company that they contract through to cover that cost for service animals. Patient is needing a signed letter from PCP recommending a service animal for patient. Patient would like to come into office to pick up she will fax once she knows the information regarding the company. Letter can be addressed to [facility] [employer].

## 2021-11-28 NOTE — Assessment & Plan Note (Signed)
Breathing is stable.  Will need to continue to use inhalers.  Use day of surgery and peri op period.

## 2021-11-28 NOTE — Assessment & Plan Note (Signed)
See MRI.  Seeing neurology.  Per neurology - ok from their standpoint for surgery.

## 2021-11-28 NOTE — Assessment & Plan Note (Signed)
Increased stress.  Overall appears to be relatively stable currently.  Follow.  Has good support.

## 2021-11-28 NOTE — Assessment & Plan Note (Signed)
No upper symptoms.  On omeprazole.  

## 2021-11-28 NOTE — Assessment & Plan Note (Signed)
Here for pre op evaluation.  Describes the increased heart rate with minimal exertion - getting up from lying in bed and walking to the bathroom.  Feels has worsened in the last 1-2 weeks.  EKG - SR with no acute ischemic changes noted.  Given worsening symptoms with minimal exertion, we discussed having cardiology evaluate prior to her planned elective surgery with question of need for any further cardiac w/up (echo, monitor, etc).  Pt in agreement.  appt made for Friay 11/28/21 with Dr Clayborn Bigness.

## 2021-11-28 NOTE — Assessment & Plan Note (Signed)
Continue crestor.  Follow lipid panel and liver function tests.  

## 2021-11-28 NOTE — Assessment & Plan Note (Signed)
Stable.  Followed by Dr Kernodle.   

## 2021-11-28 NOTE — Assessment & Plan Note (Signed)
MRI - incidental finding of a 75mm anterior communicating artery aneurysm.  S/p diagnostic cerebral angiogram with endovascular aneurysm embolization.  Recommended 81mg  aspirin for one week. She has been off aspirin.  She did well with the procedure.  Described head pain as outlined.  CT ok.  Better now.  Discussed with Dr Patrecia Pour.  No contraindication (regarding her procedure) to proceed with planned surgery.

## 2021-11-28 NOTE — Assessment & Plan Note (Signed)
Blood pressure as outlined.  On amlodipine and losartan.   Follow pressures.  Follow metabolic panel.  Blood pressure controlled.  Will need close intra op and post op monitoring of heart rate and blood pressure to avoid extremes.

## 2021-11-28 NOTE — Assessment & Plan Note (Signed)
Has had extensive GI w/up as outlined.  Continue to encourage increased po intake.  Follow.

## 2021-11-30 ENCOUNTER — Other Ambulatory Visit: Payer: Self-pay | Admitting: Internal Medicine

## 2021-12-01 ENCOUNTER — Telehealth: Payer: Self-pay

## 2021-12-01 NOTE — Telephone Encounter (Signed)
She did not mention this to me at her appt.  Please find out more information for what is needed.  If recommended by psychiatry, do they need to write.

## 2021-12-01 NOTE — Telephone Encounter (Signed)
Is this ok with you  ?

## 2021-12-01 NOTE — Telephone Encounter (Signed)
Cardiac Clearance obtained from [facility]-patient is mild  risk for cardiovascular complications for upcoming procedure.Recommend continuing beta blocker preoperatively.also recommend diligent hemodynamic and cardiopulmonary monitoring.

## 2021-12-02 ENCOUNTER — Telehealth: Payer: Self-pay | Admitting: Surgery

## 2021-12-02 ENCOUNTER — Telehealth: Payer: Self-pay | Admitting: Pharmacy Technician

## 2021-12-02 DIAGNOSIS — I10 Essential (primary) hypertension: Secondary | ICD-10-CM | POA: Diagnosis not present

## 2021-12-02 DIAGNOSIS — E7800 Pure hypercholesterolemia, unspecified: Secondary | ICD-10-CM | POA: Diagnosis not present

## 2021-12-02 DIAGNOSIS — E1121 Type 2 diabetes mellitus with diabetic nephropathy: Secondary | ICD-10-CM

## 2021-12-02 DIAGNOSIS — Z596 Low income: Secondary | ICD-10-CM

## 2021-12-02 NOTE — Progress Notes (Signed)
[facility])                                            [place] Team    12/02/2021  CALIYA NARINE 09-05-1951 [id number]  Received both patient and provider portion(s) of patient assistance application(s) for [name]. Faxed completed application and required documents into [employer].    Masiya Claassen P. Jerline Linzy, [place]  [phone]

## 2021-12-02 NOTE — Telephone Encounter (Signed)
Patient has been advised of Pre-Admission date/time, COVID Testing date and Surgery date.  Surgery Date: 12/16/21 Preadmission Testing Date: 12/10/21 (phone 8a-1p) Covid Testing Date: Not needed.   Patient has been made aware to call [phone], between 1-3:00pm the day before surgery, to find out what time to arrive for surgery.

## 2021-12-02 NOTE — Telephone Encounter (Signed)
LMTCB

## 2021-12-03 NOTE — Telephone Encounter (Signed)
Patient returning call.

## 2021-12-04 NOTE — Telephone Encounter (Signed)
Patient called in stating returning call , Please call patient @ [phone]

## 2021-12-05 NOTE — Telephone Encounter (Signed)
Placed up front for patient. Patient aware

## 2021-12-05 NOTE — Telephone Encounter (Signed)
She does not see a psychiatrist. She sees grief therapy counselor. The property manager will not except a letter from them due to their credentials. She does have one. Her granddaughter lives with her and has since her husband passed away. She will be leaving in August and therapy group is recommending her get an emotional support animal while she is still living there so that will help with the transition of living alone.

## 2021-12-05 NOTE — Telephone Encounter (Signed)
The patient is waiting on a letter for her emotional support dog.

## 2021-12-05 NOTE — Telephone Encounter (Signed)
Letter typed and printed.  Signed and placed in box.

## 2021-12-10 ENCOUNTER — Other Ambulatory Visit
Admission: RE | Admit: 2021-12-10 | Discharge: 2021-12-10 | Disposition: A | Discharge status: Home / Self Care | Payer: PPO | Source: Ambulatory Visit | Attending: Surgery | Admitting: Surgery

## 2021-12-10 ENCOUNTER — Other Ambulatory Visit: Payer: Self-pay

## 2021-12-10 NOTE — Patient Instructions (Addendum)
Your procedure is scheduled on: 12/16/21 - Tuesday Report to the Registration Desk on the 1st floor of the [facility]. To find out your arrival time, please call [phone] between 1PM - 3PM on: 12/15/21 - Monday  REMEMBER: Instructions that are not followed completely may result in serious medical risk, up to and including death; or upon the discretion of your surgeon and anesthesiologist your surgery may need to be rescheduled.  Do not eat food after midnight the night before surgery.  No gum chewing, lozengers or hard candies.  You may however, drink CLEAR liquids up to 2 hours before you are scheduled to arrive for your surgery. Do not drink anything within 2 hours of your scheduled arrival time.  Clear liquids include: - water  - apple juice without pulp - gatorade (not RED, PURPLE, OR BLUE) - black coffee or tea (Do NOT add milk or creamers to the coffee or tea) Do NOT drink anything that is not on this list.  Type 1 and Type 2 diabetics should only drink water.  TAKE THESE MEDICATIONS THE MORNING OF SURGERY WITH A SIP OF WATER:  - amLODipine (NORVASC) 5 MG tablet - budesonide-formoterol (SYMBICORT) 160-4.5 MCG/ACT inhaler - omeprazole (PRILOSEC) 20 MG capsule, (take one the night before and one on the morning of surgery - helps to prevent nausea after surgery.) - rosuvastatin (CRESTOR) 10 MG tablet  Use albuterol (VENTOLIN HFA) 108 (90 Base) MCG/ACT inhaler on the day of surgery and bring to the hospital.  One week prior to surgery: Stop Anti-inflammatories (NSAIDS) such as Advil, Aleve, Ibuprofen, Motrin, Naproxen, Naprosyn and Aspirin based products such as Excedrin, Goodys Powder, BC Powder.  Stop ANY OVER THE COUNTER supplements until after surgery.  You may however, continue to take Tylenol if needed for pain up until the day of surgery.  No Alcohol for 24 hours before or after surgery.  No Smoking including e-cigarettes for 24 hours prior to surgery.  No  chewable tobacco products for at least 6 hours prior to surgery.  No nicotine patches on the day of surgery.  Do not use any "recreational" drugs for at least a week prior to your surgery.  Please be advised that the combination of cocaine and anesthesia may have negative outcomes, up to and including death. If you test positive for cocaine, your surgery will be cancelled.  On the morning of surgery brush your teeth with toothpaste and water, you may rinse your mouth with mouthwash if you wish. Do not swallow any toothpaste or mouthwash.  Use CHG Soap or wipes as directed on instruction sheet.  Do not wear jewelry, make-up, hairpins, clips or nail polish.  Do not wear lotions, powders, or perfumes.   Do not shave body from the neck down 48 hours prior to surgery just in case you cut yourself which could leave a site for infection.  Also, freshly shaved skin may become irritated if using the CHG soap.  Contact lenses, hearing aids and dentures may not be worn into surgery.  Do not bring valuables to the hospital. [facility] is not responsible for any missing/lost belongings or valuables.   Notify your doctor if there is any change in your medical condition (cold, fever, infection).  Wear comfortable clothing (specific to your surgery type) to the hospital.  After surgery, you can help prevent lung complications by doing breathing exercises.  Take deep breaths and cough every 1-2 hours. Your doctor may order a device called an Chiropodist to  help you take deep breaths. When coughing or sneezing, hold a pillow firmly against your incision with both hands. This is called splinting. Doing this helps protect your incision. It also decreases belly discomfort.  If you are being admitted to the hospital overnight, leave your suitcase in the car. After surgery it may be brought to your room.  If you are being discharged the day of surgery, you will not be allowed to drive  home. You will need a responsible adult (18 years or older) to drive you home and stay with you that night.   If you are taking public transportation, you will need to have a responsible adult (18 years or older) with you. Please confirm with your physician that it is acceptable to use public transportation.   Please call the [facility] at [phone] if you have any questions about these instructions.  Surgery Visitation Policy:  Patients undergoing a surgery or procedure may have one family member or support person with them as long as that person is not COVID-19 positive or experiencing its symptoms.  That person may remain in the waiting area during the procedure and may rotate out with other people.  Inpatient Visitation:    Visiting hours are 7 a.m. to 8 p.m. Up to two visitors ages 16+ are allowed at one time in a patient room. The visitors may rotate out with other people during the day. Visitors must check out when they leave, or other visitors will not be allowed. One designated support person may remain overnight. The visitor must pass COVID-19 screenings, use hand sanitizer when entering and exiting the patients room and wear a mask at all times, including in the patients room. Patients must also wear a mask when staff or their visitor are in the room. Masking is required regardless of vaccination status.

## 2021-12-16 ENCOUNTER — Other Ambulatory Visit: Payer: Self-pay

## 2021-12-16 ENCOUNTER — Encounter: Payer: Self-pay | Admitting: Surgery

## 2021-12-16 ENCOUNTER — Ambulatory Visit: Payer: PPO | Admitting: Certified Registered Nurse Anesthetist

## 2021-12-16 ENCOUNTER — Encounter
Admission: RE | Disposition: A | Discharge status: Home / Self Care | Payer: Self-pay | Source: Home / Self Care | Attending: Surgery

## 2021-12-16 ENCOUNTER — Ambulatory Visit
Admission: RE | Admit: 2021-12-16 | Discharge: 2021-12-16 | Disposition: A | Discharge status: Home / Self Care | Payer: PPO | Attending: Surgery | Admitting: Surgery

## 2021-12-16 DIAGNOSIS — Z794 Long term (current) use of insulin: Secondary | ICD-10-CM | POA: Diagnosis not present

## 2021-12-16 DIAGNOSIS — L4050 Arthropathic psoriasis, unspecified: Secondary | ICD-10-CM | POA: Insufficient documentation

## 2021-12-16 DIAGNOSIS — E7800 Pure hypercholesterolemia, unspecified: Secondary | ICD-10-CM | POA: Diagnosis not present

## 2021-12-16 DIAGNOSIS — K449 Diaphragmatic hernia without obstruction or gangrene: Secondary | ICD-10-CM | POA: Insufficient documentation

## 2021-12-16 DIAGNOSIS — K219 Gastro-esophageal reflux disease without esophagitis: Secondary | ICD-10-CM | POA: Diagnosis not present

## 2021-12-16 DIAGNOSIS — K589 Irritable bowel syndrome without diarrhea: Secondary | ICD-10-CM | POA: Insufficient documentation

## 2021-12-16 DIAGNOSIS — E119 Type 2 diabetes mellitus without complications: Secondary | ICD-10-CM | POA: Insufficient documentation

## 2021-12-16 DIAGNOSIS — I10 Essential (primary) hypertension: Secondary | ICD-10-CM | POA: Insufficient documentation

## 2021-12-16 DIAGNOSIS — M1990 Unspecified osteoarthritis, unspecified site: Secondary | ICD-10-CM | POA: Diagnosis not present

## 2021-12-16 DIAGNOSIS — K4090 Unilateral inguinal hernia, without obstruction or gangrene, not specified as recurrent: Secondary | ICD-10-CM | POA: Insufficient documentation

## 2021-12-16 DIAGNOSIS — J45909 Unspecified asthma, uncomplicated: Secondary | ICD-10-CM | POA: Insufficient documentation

## 2021-12-16 HISTORY — PX: XI ROBOTIC ASSISTED INGUINAL HERNIA REPAIR WITH MESH: SHX6706

## 2021-12-16 LAB — GLUCOSE, CAPILLARY
Glucose-Capillary: 85 mg/dL (ref 70–99)
Glucose-Capillary: 108 mg/dL — ABNORMAL HIGH (ref 70–99)

## 2021-12-16 SURGERY — REPAIR, HERNIA, INGUINAL, ROBOT-ASSISTED, LAPAROSCOPIC, USING MESH
Anesthesia: General | Laterality: Left

## 2021-12-16 MED ORDER — BUPIVACAINE LIPOSOME 1.3 % IJ SUSP: 20.0000 mL | Freq: Once | INTRAMUSCULAR | Status: DC

## 2021-12-16 MED ORDER — SODIUM CHLORIDE 0.9 % IV SOLN: INTRAVENOUS | Status: DC

## 2021-12-16 MED ORDER — CEFAZOLIN SODIUM-DEXTROSE 2-4 GM/100ML-% IV SOLN: 2.0000 g | INTRAVENOUS | Status: DC

## 2021-12-16 MED ORDER — BUPIVACAINE-EPINEPHRINE (PF) 0.5% -1:200000 IJ SOLN
INTRAMUSCULAR | Status: AC
  Filled 2021-12-16: qty 30

## 2021-12-16 MED ORDER — LACTATED RINGERS IV SOLN: INTRAVENOUS | Status: DC | PRN

## 2021-12-16 MED ORDER — GABAPENTIN 100 MG PO CAPS
ORAL_CAPSULE | ORAL | Status: AC
  Filled 2021-12-16: qty 2

## 2021-12-16 MED ORDER — PROPOFOL 10 MG/ML IV BOLUS
INTRAVENOUS | Status: DC | PRN
  Administered 2021-12-16: 70 mg via INTRAVENOUS

## 2021-12-16 MED ORDER — FENTANYL CITRATE (PF) 100 MCG/2ML IJ SOLN
INTRAMUSCULAR | Status: DC | PRN
  Administered 2021-12-16: 50 ug via INTRAVENOUS

## 2021-12-16 MED ORDER — PHENYLEPHRINE HCL (PRESSORS) 10 MG/ML IV SOLN
INTRAVENOUS | Status: DC | PRN
  Administered 2021-12-16: 200 ug via INTRAVENOUS

## 2021-12-16 MED ORDER — DEXAMETHASONE SODIUM PHOSPHATE 10 MG/ML IJ SOLN
INTRAMUSCULAR | Status: DC | PRN
  Administered 2021-12-16: 10 mg via INTRAVENOUS

## 2021-12-16 MED ORDER — CHLORHEXIDINE GLUCONATE 0.12 % MT SOLN
OROMUCOSAL | Status: AC
  Administered 2021-12-16: 15 mL
  Filled 2021-12-16: qty 15

## 2021-12-16 MED ORDER — LIDOCAINE HCL (PF) 2 % IJ SOLN
INTRAMUSCULAR | Status: AC
  Filled 2021-12-16: qty 5

## 2021-12-16 MED ORDER — OXYCODONE HCL 5 MG PO TABS: 5.0000 mg | ORAL_TABLET | ORAL | 0 refills | Status: DC | PRN

## 2021-12-16 MED ORDER — ACETAMINOPHEN 500 MG PO TABS
1000.0000 mg | ORAL_TABLET | Freq: Four times a day (QID) | ORAL | Status: AC | PRN
Start: 2021-12-16 — End: ?

## 2021-12-16 MED ORDER — DEXAMETHASONE SODIUM PHOSPHATE 10 MG/ML IJ SOLN
INTRAMUSCULAR | Status: AC
  Filled 2021-12-16: qty 1

## 2021-12-16 MED ORDER — PHENYLEPHRINE HCL-NACL 20-0.9 MG/250ML-% IV SOLN
INTRAVENOUS | Status: DC | PRN
  Administered 2021-12-16: 30 ug/min via INTRAVENOUS

## 2021-12-16 MED ORDER — CHLORHEXIDINE GLUCONATE CLOTH 2 % EX PADS
6.0000 | MEDICATED_PAD | Freq: Once | CUTANEOUS | Status: AC
  Administered 2021-12-16: 6 via TOPICAL

## 2021-12-16 MED ORDER — BUPIVACAINE LIPOSOME 1.3 % IJ SUSP
INTRAMUSCULAR | Status: AC
  Filled 2021-12-16: qty 20

## 2021-12-16 MED ORDER — ONDANSETRON HCL 4 MG/2ML IJ SOLN
INTRAMUSCULAR | Status: DC | PRN
Start: 2021-12-16 — End: 2021-12-16
  Administered 2021-12-16: 4 mg via INTRAVENOUS

## 2021-12-16 MED ORDER — PROPOFOL 10 MG/ML IV BOLUS
INTRAVENOUS | Status: AC
  Filled 2021-12-16: qty 20

## 2021-12-16 MED ORDER — ONDANSETRON HCL 4 MG/2ML IJ SOLN
INTRAMUSCULAR | Status: AC
  Filled 2021-12-16: qty 2

## 2021-12-16 MED ORDER — FENTANYL CITRATE (PF) 100 MCG/2ML IJ SOLN
25.0000 ug | INTRAMUSCULAR | Status: DC | PRN
  Administered 2021-12-16 (×3): 25 ug via INTRAVENOUS

## 2021-12-16 MED ORDER — OXYCODONE HCL 5 MG PO TABS
5.0000 mg | ORAL_TABLET | ORAL | Status: DC | PRN
  Administered 2021-12-16: 5 mg via ORAL

## 2021-12-16 MED ORDER — DEXMEDETOMIDINE (PRECEDEX) IN NS 20 MCG/5ML (4 MCG/ML) IV SYRINGE
PREFILLED_SYRINGE | INTRAVENOUS | Status: DC | PRN
  Administered 2021-12-16: 12 ug via INTRAVENOUS

## 2021-12-16 MED ORDER — GABAPENTIN 300 MG PO CAPS
ORAL_CAPSULE | ORAL | Status: AC
  Administered 2021-12-16: 200 mg via ORAL
  Filled 2021-12-16: qty 1

## 2021-12-16 MED ORDER — ORAL CARE MOUTH RINSE: 15.0000 mL | Freq: Once | OROMUCOSAL | Status: DC

## 2021-12-16 MED ORDER — ONDANSETRON HCL 4 MG/2ML IJ SOLN: 4.0000 mg | Freq: Once | INTRAMUSCULAR | Status: DC | PRN

## 2021-12-16 MED ORDER — ACETAMINOPHEN 500 MG PO TABS
ORAL_TABLET | ORAL | Status: AC
  Administered 2021-12-16: 1000 mg via ORAL
  Filled 2021-12-16: qty 2

## 2021-12-16 MED ORDER — SUGAMMADEX SODIUM 500 MG/5ML IV SOLN
INTRAVENOUS | Status: DC | PRN
Start: 2021-12-16 — End: 2021-12-16
  Administered 2021-12-16: 200 mg via INTRAVENOUS

## 2021-12-16 MED ORDER — CHLORHEXIDINE GLUCONATE CLOTH 2 % EX PADS: 6.0000 | MEDICATED_PAD | Freq: Once | CUTANEOUS | Status: DC

## 2021-12-16 MED ORDER — BUPIVACAINE-EPINEPHRINE 0.5% -1:200000 IJ SOLN
INTRAMUSCULAR | Status: DC | PRN
  Administered 2021-12-16: 50 mL

## 2021-12-16 MED ORDER — LIDOCAINE HCL (CARDIAC) PF 100 MG/5ML IV SOSY
PREFILLED_SYRINGE | INTRAVENOUS | Status: DC | PRN
  Administered 2021-12-16: 100 mg via INTRAVENOUS

## 2021-12-16 MED ORDER — ROCURONIUM BROMIDE 100 MG/10ML IV SOLN
INTRAVENOUS | Status: DC | PRN
  Administered 2021-12-16: 10 mg via INTRAVENOUS
  Administered 2021-12-16: 40 mg via INTRAVENOUS

## 2021-12-16 MED ORDER — GABAPENTIN 100 MG PO CAPS: 200.0000 mg | ORAL_CAPSULE | ORAL | Status: AC

## 2021-12-16 MED ORDER — KETOROLAC TROMETHAMINE 30 MG/ML IJ SOLN
INTRAMUSCULAR | Status: DC | PRN
Start: 2021-12-16 — End: 2021-12-16
  Administered 2021-12-16: 15 mg via INTRAVENOUS

## 2021-12-16 MED ORDER — OXYCODONE HCL 5 MG PO TABS
ORAL_TABLET | ORAL | Status: AC
  Filled 2021-12-16: qty 1

## 2021-12-16 MED ORDER — FENTANYL CITRATE (PF) 100 MCG/2ML IJ SOLN
INTRAMUSCULAR | Status: AC
  Filled 2021-12-16: qty 2

## 2021-12-16 MED ORDER — CEFAZOLIN SODIUM-DEXTROSE 2-4 GM/100ML-% IV SOLN
INTRAVENOUS | Status: AC
  Filled 2021-12-16: qty 100

## 2021-12-16 MED ORDER — FENTANYL CITRATE (PF) 100 MCG/2ML IJ SOLN
INTRAMUSCULAR | Status: AC
  Administered 2021-12-16: 25 ug via INTRAVENOUS
  Filled 2021-12-16: qty 2

## 2021-12-16 MED ORDER — ACETAMINOPHEN 500 MG PO TABS: 1000.0000 mg | ORAL_TABLET | ORAL | Status: AC

## 2021-12-16 SURGICAL SUPPLY — 56 items
ADH SKN CLS APL DERMABOND .7 (GAUZE/BANDAGES/DRESSINGS) ×1
APL PRP STRL LF DISP 70% ISPRP (MISCELLANEOUS) ×1
CANNULA REDUC XI 12-8 STAPL (CANNULA) ×1
CANNULA REDUCER 12-8 DVNC XI (CANNULA) ×1 IMPLANT
CHLORAPREP W/TINT 26 (MISCELLANEOUS) ×2 IMPLANT
COVER TIP SHEARS 8 DVNC (MISCELLANEOUS) ×1 IMPLANT
COVER TIP SHEARS 8MM DA VINCI (MISCELLANEOUS) ×1
DEFOGGER SCOPE WARMER CLEARIFY (MISCELLANEOUS) ×2 IMPLANT
DERMABOND ADVANCED (GAUZE/BANDAGES/DRESSINGS) ×1
DERMABOND ADVANCED .7 DNX12 (GAUZE/BANDAGES/DRESSINGS) ×1 IMPLANT
DRAPE ARM DVNC X/XI (DISPOSABLE) ×3 IMPLANT
DRAPE COLUMN DVNC XI (DISPOSABLE) ×1 IMPLANT
DRAPE DA VINCI XI ARM (DISPOSABLE) ×3
DRAPE DA VINCI XI COLUMN (DISPOSABLE) ×1
ELECT CAUTERY BLADE TIP 2.5 (TIP) ×2
ELECT REM PT RETURN 9FT ADLT (ELECTROSURGICAL) ×2
ELECTRODE CAUTERY BLDE TIP 2.5 (TIP) ×1 IMPLANT
ELECTRODE REM PT RTRN 9FT ADLT (ELECTROSURGICAL) ×1 IMPLANT
GAUZE 4X4 16PLY ~~LOC~~+RFID DBL (SPONGE) ×1 IMPLANT
GLOVE SURG SYN 7.0 (GLOVE) ×12 IMPLANT
GLOVE SURG SYN 7.0 PF PI (GLOVE) ×2 IMPLANT
GLOVE SURG SYN 7.5  E (GLOVE) ×6
GLOVE SURG SYN 7.5 E (GLOVE) ×6 IMPLANT
GLOVE SURG SYN 7.5 PF PI (GLOVE) ×2 IMPLANT
GOWN STRL REUS W/ TWL LRG LVL3 (GOWN DISPOSABLE) ×4 IMPLANT
GOWN STRL REUS W/TWL LRG LVL3 (GOWN DISPOSABLE) ×12
KIT PINK PAD W/HEAD ARE REST (MISCELLANEOUS) ×2
KIT PINK PAD W/HEAD ARM REST (MISCELLANEOUS) ×1 IMPLANT
LABEL OR SOLS (LABEL) ×2 IMPLANT
MESH 3DMAX 4X6 LT LRG (Mesh General) ×1 IMPLANT
MESH 3DMAX MID 4X6 LT LRG (Mesh General) IMPLANT
NDL INSUFFLATION 14GA 120MM (NEEDLE) ×1 IMPLANT
NEEDLE HYPO 22GX1.5 SAFETY (NEEDLE) ×2 IMPLANT
NEEDLE INSUFFLATION 14GA 120MM (NEEDLE) ×2 IMPLANT
OBTURATOR OPTICAL STANDARD 8MM (TROCAR) ×1
OBTURATOR OPTICAL STND 8 DVNC (TROCAR) ×1
OBTURATOR OPTICALSTD 8 DVNC (TROCAR) ×1 IMPLANT
PACK LAP CHOLECYSTECTOMY (MISCELLANEOUS) ×2 IMPLANT
PENCIL ELECTRO HAND CTR (MISCELLANEOUS) ×2 IMPLANT
SEAL CANN UNIV 5-8 DVNC XI (MISCELLANEOUS) ×3 IMPLANT
SEAL XI 5MM-8MM UNIVERSAL (MISCELLANEOUS) ×3
SET TUBE SMOKE EVAC HIGH FLOW (TUBING) ×2 IMPLANT
SOLUTION ELECTROLUBE (MISCELLANEOUS) ×2 IMPLANT
STAPLER CANNULA SEAL DVNC XI (STAPLE) ×1 IMPLANT
STAPLER CANNULA SEAL XI (STAPLE) ×1
SUT MNCRL 4-0 (SUTURE) ×4
SUT MNCRL 4-0 27XMFL (SUTURE) ×2
SUT MNCRL AB 4-0 PS2 18 (SUTURE) ×2 IMPLANT
SUT VIC AB 2-0 SH 27 (SUTURE) ×4
SUT VIC AB 2-0 SH 27XBRD (SUTURE) ×2 IMPLANT
SUT VICRYL 0 AB UR-6 (SUTURE) ×4 IMPLANT
SUT VLOC 90 S/L VL9 GS22 (SUTURE) ×2 IMPLANT
SUTURE MNCRL 4-0 27XMF (SUTURE) IMPLANT
TAPE TRANSPORE STRL 2 31045 (GAUZE/BANDAGES/DRESSINGS) ×2 IMPLANT
TRAY FOLEY SLVR 16FR LF STAT (SET/KITS/TRAYS/PACK) ×2 IMPLANT
TROCAR BALLN GELPORT 12X130M (ENDOMECHANICALS) ×2 IMPLANT

## 2021-12-16 NOTE — Transfer of Care (Addendum)
Immediate Anesthesia Transfer of Care Note  Patient: Ashley Gross  Procedure(s) Performed: XI ROBOTIC ASSISTED INGUINAL HERNIA REPAIR WITH MESH (Left)  Patient Location: PACU  Anesthesia Type:General  Level of Consciousness: drowsy  Airway & Oxygen Therapy: Patient Spontanous Breathing and Patient connected to face mask oxygen  Post-op Assessment: Report given to RN  Post vital signs: Reviewed and stable  Last Vitals:  Vitals Value Taken Time  BP    Temp    Pulse    Resp    SpO2      Last Pain:  Vitals:   12/16/21 1119  TempSrc: Temporal  PainSc: 0-No pain         Complications: No notable events documented.

## 2021-12-16 NOTE — Op Note (Signed)
Procedure Date:  12/16/2021  Pre-operative Diagnosis:  Left inguinal hernia  Post-operative Diagnosis: Left inguinal hernia  Procedure: 1.  Robotic assisted Left Inguinal Hernia Repair 2.  Creation of Left Posterior Rectus-Transversalis Fascia Advancment Flap for Coverage of Pelvic Wound (200 cm)  Surgeon:  Melvyn Neth, MD  Assistant:  Elie Goody, PA-S  Anesthesia:  General endotracheal  Estimated Blood Loss:  10 ml  Specimens:  None  Complications:  None  Indications for Procedure:  This is a 71 y.o. female who presents with a left inguinal hernia.  The options of surgery versus observation were reviewed with the patient and/or family. The risks of bleeding, abscess or infection, recurrence of symptoms, potential for an open procedure, injury to surrounding structures, and chronic pain were all discussed with the patient and she was willing to proceed.  We have planned this transabdominal procedure with the creation of left peritoneal flap based on the posterior rectus sheath and transversalis fascia in order to fully cover the mesh, creating a natural tisssue barrier for the bowel and peritoneal cavity.  Description of Procedure: The patient was correctly identified in the preoperative area and brought into the operating room.  The patient was placed supine with VTE prophylaxis in place.  Appropriate time-outs were performed.  Anesthesia was induced and the patient was intubated.  Foley catheter was placed.  Appropriate antibiotics were infused.  The abdomen was prepped and draped in a sterile fashion. A supraumbilical incision was made. A cutdown technique was used to enter the abdominal cavity without injury, and a Hasson trocar was inserted.  Pneumoperitoneum was obtained with appropriate opening pressures.  A Veress needle was used to start dissecting the peritoneal flap.  Two 8-mm robotic ports were placed in the right and left lateral positions under direct visualization.   A large left 3D Max Mid Bard Mesh, a 2-0 Vicryl, and 2-0 vloc suture were placed through the umbilical port under direct visualization.  The [employer] platform was docked onto the patient, the camera was inserted and targeted, and the instruments were placed under direct visualization.  Both inguinal regions were inspected for hernias and it was confirmed that the patient had a left inguinal hernia.  Using electocautery, the peritoneal and posterior rectus tissue flap was created.  The peritoneum on the left side was scored from the median umbilical ligament laterally towards the ASIS.  The flap was mobilized using robotic scissors and the bipolar instruments, creating a plane along the posterior rectus sheath and transversalis fascia down to the pubic tubercle medially. It was then further mobilized laterally across the inguinal canal and femoral vessels and onto the psoas muscle. The inferior epigastric vessels were identified and preserved. This created a posterior rectus and peritoneal flap measuring roughly 17 cm x 12 cm.  The hernia sac and contents were reduced preserving all structures.  A left large Bard 3D Max mesh was placed with good overlap along all the potential hernia defects and secured in place with 2-0 Vicryl along the medial superomedial and superolateral aspects.  Then, the peritoneal flap was advanced over the mesh and carried over to close the defect. A running 2 O V lock suture was used to approximate the edge of the flap onto the peritoneum.  All needles were removed under direct visualization.  The 8- mm ports were removed under direct visualization and the Hasson trocar was removed.  The fascial opening was closed using 0 vicryl suture.  Local anesthetic was infused in all  incisions as well as a left ilioinguinal block, and the incisions were closed with 4-0 Monocryl.  The wounds were cleaned and sealed with DermaBond.  Foley catheter was removed and the patient was emerged from  anesthesia and extubated and brought to the recovery room for further management.  The patient tolerated the procedure well and all counts were correct at the end of the case.   Melvyn Neth, MD

## 2021-12-16 NOTE — Anesthesia Procedure Notes (Signed)
Procedure Name: Intubation Date/Time: 12/16/2021 1:28 PM Performed by: Beverely Low, CRNA Pre-anesthesia Checklist: Patient identified, Patient being monitored, Timeout performed, Emergency Drugs available and Suction available Patient Re-evaluated:Patient Re-evaluated prior to induction Oxygen Delivery Method: Circle system utilized Preoxygenation: Pre-oxygenation with 100% oxygen Induction Type: IV induction Ventilation: Mask ventilation without difficulty Laryngoscope Size: 3 and McGraph Grade View: Grade I Tube type: Oral Tube size: 7.0 mm Number of attempts: 1 Placement Confirmation: ETT inserted through vocal cords under direct vision, positive ETCO2 and breath sounds checked- equal and bilateral Secured at: 21 cm Tube secured with: Tape Dental Injury: Teeth and Oropharynx as per pre-operative assessment

## 2021-12-16 NOTE — Anesthesia Preprocedure Evaluation (Signed)
Anesthesia Evaluation  Patient identified by MRN, date of birth, ID band Patient awake    Reviewed: Allergy & Precautions, H&P , NPO status , Patient's Chart, lab work & pertinent test results  History of Anesthesia Complications Negative for: history of anesthetic complications  Airway Mallampati: III  TM Distance: <3 FB Neck ROM: limited    Dental  (+) Chipped, Dental Advidsory Given   Pulmonary neg shortness of breath, asthma , neg sleep apnea, pneumonia, neg COPD, neg recent URI,    Pulmonary exam normal        Cardiovascular Exercise Tolerance: Good hypertension, (-) angina(-) Past MI and (-) DOE Normal cardiovascular exam(-) dysrhythmias + Valvular Problems/Murmurs      Neuro/Psych neg Seizures S/p brain aneurysm surgery negative psych ROS   GI/Hepatic Neg liver ROS, hiatal hernia, GERD  Medicated and Controlled,  Endo/Other  diabetes, Type 2, Insulin Dependent  Renal/GU Renal disease     Musculoskeletal  (+) Arthritis ,   Abdominal   Peds  Hematology negative hematology ROS (+)   Anesthesia Other Findings Past Medical History: No date: Anemia, unspecified No date: Asthma No date: Colitis No date: Diabetes mellitus (HCC) No date: Esophagitis No date: H/O hiatal hernia No date: Heart murmur No date: Hx of arteriovenous malformation (AVM) No date: Hypertension No date: IBS (irritable bowel syndrome) No date: Nephrolithiasis No date: Pernicious anemia No date: Pneumonia No date: Psoriatic arthritis (HCC)     Comment:  s/p penicillamine, plaquenil, MTX, sulfasalazine.  s/p               Embrel, Humira,.  Iritis.   No date: Pure hypercholesterolemia  Past Surgical History: 1981: ABDOMINAL HYSTERECTOMY     Comment:  ovaries left in place No date: ANKLE SURGERY     Comment:  right No date: BUNIONECTOMY 1989: CHOLECYSTECTOMY 12/27/2017: COLONOSCOPY WITH PROPOFOL; N/A     Comment:  Procedure:  COLONOSCOPY WITH PROPOFOL;  Surgeon: Lin Landsman, MD;  Location: [facility] ENDOSCOPY;  Service:               Gastroenterology;  Laterality: N/A; 12/27/2017: ESOPHAGOGASTRODUODENOSCOPY (EGD) WITH PROPOFOL; N/A     Comment:  Procedure: ESOPHAGOGASTRODUODENOSCOPY (EGD) WITH               PROPOFOL;  Surgeon: Lin Landsman, MD;  Location:               [facility] ENDOSCOPY;  Service: Gastroenterology;  Laterality:               N/A; No date: HAND SURGERY     Comment:  right No date: HEEL SPUR SURGERY No date: NOSE SURGERY     Comment:  x2 No date: UMBILICAL HERNIA REPAIR  BMI    Body Mass Index:  31.48 kg/m      Reproductive/Obstetrics negative OB ROS                             Anesthesia Physical  Anesthesia Plan  ASA: III  Anesthesia Plan: General   Post-op Pain Management: GA combined w/ Regional for post-op pain   Induction: Intravenous  PONV Risk Score and Plan: 3 and Ondansetron, Dexamethasone, Midazolam and Treatment may vary due to age or medical condition  Airway Management Planned: Oral ETT  Additional Equipment:   Intra-op Plan:   Post-operative Plan: Extubation in OR  Informed  Consent: I have reviewed the patients History and Physical, chart, labs and discussed the procedure including the risks, benefits and alternatives for the proposed anesthesia with the patient or authorized representative who has indicated his/her understanding and acceptance.     Dental Advisory Given  Plan Discussed with: Anesthesiologist, CRNA and Surgeon  Anesthesia Plan Comments: (Patient consented for risks of anesthesia including but not limited to:  - adverse reactions to medications - damage to teeth, lips or other oral mucosa - sore throat or hoarseness - Damage to heart, brain, lungs or loss of life  Patient voiced understanding.)        Anesthesia Quick Evaluation

## 2021-12-16 NOTE — H&P (Signed)
Reason for Visit:  Left inguinal hernia   Requesting Provider:  Sherri Sear, MD   History of Present Illness: Ashley Gross is a 71 y.o. female presenting for evaluation of left inguinal hernia.  The patient reports having long history of diarrhea and is being treated for possible pancreatic insufficiency by Dr. Marius Ditch.  She also reports having left lower quadrant fullness and bulging sensation that has been ongoing for several months.  She had an ultrasound of the pelvis on 10/29/2021 which noted a fat-containing hernia.  Based on location reported by the patient, this appears to be a left inguinal hernia.  She does note that when she strains for instance when sitting up, the bulge in the left groin goes out.  There is no significant discomfort with this but does notice the area of swelling in the left groin.  Denies any fevers, chills, chest pain, shortness of breath, nausea, vomiting, other areas of abdominal pain.   She has had prior abdominal surgeries including abdominal hysterectomy, cholecystectomy, and umbilical hernia repair.  She is uncertain if mesh was used or not.  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     Past Medical History:     Past Medical History:  Diagnosis Date   Abnormal liver function test 10/06/2012   Anemia, unspecified     Asthma     B12 deficiency 02/11/2019   Bleeding internal hemorrhoids 09/13/2017   Choledocholithiasis 06/29/2012    Formatting of this note might be different from the original. Dilated CBD on abdominal imaging 04/2012   Chronic diarrhea 02/16/2013   Colitis     Complication of anesthesia      asthma attack after shoulder surgery   Diabetes mellitus ([place])      type II   Esophagitis     GERD (gastroesophageal reflux disease)     Heart murmur      for years- nothing to be concerned about.   History of kidney stones     Hx of arteriovenous  malformation (AVM)     Hypertension     IBS (irritable bowel syndrome)     Pernicious anemia     Personal history of colonic polyps 02/10/2013    02/08/13 colonoscopy - question of rectal varices, two 3-29mm polyps in the sigmoid colon, mass at the hepatic flexure, one 65mm polyp at the hepatic flexure, nodule at the ileocecal valve, congested mucusa in the entire colon, flattened villi mucosa in the terminal ileum.     Pneumonia     Psoriatic arthritis ([place])      s/p penicillamine, plaquenil, MTX, sulfasalazine.  s/p Embrel, Humira,.  Iritis.     Pure hypercholesterolemia     Rectal bleeding 07/15/2017      Past Surgical History:      Past Surgical History:  Procedure Laterality Date   ABDOMINAL HYSTERECTOMY   1981    ovaries left in place   ANKLE SURGERY        right   BUNIONECTOMY       CHOLECYSTECTOMY   1989   COLONOSCOPY WITH PROPOFOL N/A 12/27/2017    Procedure: COLONOSCOPY WITH PROPOFOL;  Surgeon: Lin Landsman, MD;  Location: [facility] ENDOSCOPY;  Service: Gastroenterology;  Laterality: N/A;   COLONOSCOPY WITH PROPOFOL N/A 01/04/2019    Procedure: COLONOSCOPY WITH PROPOFOL;  Surgeon: Lin Landsman, MD;  Location: [place];  Service: Endoscopy;  Laterality: N/A;  Diabetic -  oral and injectable   COLONOSCOPY WITH PROPOFOL N/A 07/24/2021    Procedure: COLONOSCOPY WITH PROPOFOL;  Surgeon: Lin Landsman, MD;  Location: [place];  Service: Endoscopy;  Laterality: N/A;  Diabetic   ESOPHAGOGASTRODUODENOSCOPY (EGD) WITH PROPOFOL N/A 12/27/2017    Procedure: ESOPHAGOGASTRODUODENOSCOPY (EGD) WITH PROPOFOL;  Surgeon: Lin Landsman, MD;  Location: [facility] ENDOSCOPY;  Service: Gastroenterology;  Laterality: N/A;   HAND SURGERY        right   HEEL SPUR SURGERY       IR 3D INDEPENDENT WKST   10/01/2021   IR ANGIO INTRA EXTRACRAN SEL COM CAROTID INNOMINATE UNI L MOD SED   10/01/2021   IR ANGIO INTRA EXTRACRAN SEL INTERNAL CAROTID UNI R MOD SED   10/01/2021    IR ANGIO VERTEBRAL SEL VERTEBRAL BILAT MOD SED   10/01/2021   IR CT HEAD LTD   10/01/2021   IR TRANSCATH/EMBOLIZ   10/01/2021   IR US GUIDE VASC ACCESS RIGHT   10/01/2021   NOSE SURGERY        x2   POLYPECTOMY   01/04/2019    Procedure: POLYPECTOMY;  Surgeon: Lin Landsman, MD;  Location: [place];  Service: Endoscopy;;   POLYPECTOMY N/A 07/24/2021    Procedure: POLYPECTOMY;  Surgeon: Lin Landsman, MD;  Location: [place];  Service: Endoscopy;  Laterality: N/A;   RADIOLOGY WITH ANESTHESIA N/A 10/01/2021    Procedure: Starr Sinclair WITH ANESTHESIA;  Surgeon: Pedro Earls, MD;  Location: [place];  Service: Radiology;  Laterality: N/A;   SHOULDER ARTHROSCOPY WITH OPEN ROTATOR CUFF REPAIR Left 09/20/2018    Procedure: SHOULDER ARTHROSCOPY WITH OPEN ROTATOR CUFF REPAIR;  Surgeon: Corky Mull, MD;  Location: [facility];  Service: Orthopedics;  Laterality: Left;   SHOULDER CLOSED REDUCTION Left 12/21/2018    Procedure: MANIPULATION UNDER ANESTHESIA WITH STEROID INJECTION;  Surgeon: Corky Mull, MD;  Location: [place];  Service: Orthopedics;  Laterality: Left;  Diabetic - insulin and oral meds   UMBILICAL HERNIA REPAIR          Home Medications:        Prior to Admission medications   Medication Sig Start Date End Date Taking? Authorizing Provider  acetaminophen (TYLENOL) 325 MG tablet Take 650 mg by mouth every 6 (six) hours as needed for moderate pain.     Yes [provider]  albuterol (PROVENTIL) (2.5 MG/3ML) 0.083% nebulizer solution Take 3 mLs (2.5 mg total) by nebulization every 4 (four) hours as needed for wheezing or shortness of breath. 04/02/21   Yes McLean-Scocuzza, Nino Glow, MD  amLODipine (NORVASC) 5 MG tablet TAKE 1 TABLET(5 MG) BY MOUTH TWICE DAILY 08/26/21   Yes Einar Pheasant, MD  budesonide-formoterol [facility]) 160-4.5 MCG/ACT inhaler Inhale 2 puffs into the lungs 2 (two) times daily. Patient taking  differently: Inhale 2 puffs into the lungs 2 (two) times daily as needed (shortness of breath or wheezing). 11/05/21   Yes Einar Pheasant, MD  Cholecalciferol (VITAMIN D-3 PO) Take 2,000 Units by mouth daily.     Yes [provider]  CREON 36000-114000 units CPEP capsule Take 36,000 Units by mouth 3 (three) times daily with meals. Also 1 capsule with evening snack 08/08/21   Yes [provider]  cyclobenzaprine (FLEXERIL) 10 MG tablet TAKE 1 TABLET BY MOUTH EVERY NIGHT AS NEEDED Patient taking differently: Take 10 mg by mouth at bedtime. 09/18/21   Yes Einar Pheasant, MD  diphenhydramine-acetaminophen (TYLENOL PM) 25-500 MG  TABS tablet Take 2 tablets by mouth at bedtime.     Yes [provider]  insulin degludec (TRESIBA FLEXTOUCH) 100 UNIT/ML FlexTouch Pen Inject 14 Units into the skin daily. 11/05/21   Yes Einar Pheasant, MD  losartan (COZAAR) 100 MG tablet TAKE 1 TABLET BY MOUTH EVERY DAY 08/26/21   Yes Einar Pheasant, MD  Melatonin 10 MG TABS Take 10 mg by mouth at bedtime.     Yes [provider]  omeprazole (PRILOSEC) 20 MG capsule TAKE 1 CAPSULE(20 MG) BY MOUTH TWICE DAILY 06/27/21   Yes Einar Pheasant, MD  ONE TOUCH ULTRA TEST test strip PATIENT NEEDS NEW METER STRIPS AND LANCETS FOR ONE TOUCH. HER INSURANCE NO LONGER COVERS ACCUCHEK. 07/31/15   Yes Einar Pheasant, MD  rosuvastatin (CRESTOR) 10 MG tablet TAKE 1 TABLET(10 MG) BY MOUTH DAILY 08/26/21   Yes Einar Pheasant, MD  albuterol (VENTOLIN HFA) 108 (90 Base) MCG/ACT inhaler Inhale 2 puffs into the lungs every 6 (six) hours as needed for wheezing or shortness of breath.       [provider]      Allergies:      Allergies  Allergen Reactions   Aspirin Other (See Comments)      Increased bleeding   Beta Adrenergic Blockers        3rd degree blockage    Lisinopril Cough   Mtx Support [Cobalamine Combinations] Other (See Comments)      Respiratory symptoms   Vasotec [Enalapril] Cough    Verapamil Other (See Comments)      Heart block   Voltaren [Diclofenac Sodium]        Pt unsure of reaction    Erythromycin Other (See Comments)      Gi intolerance   Indocin [Indomethacin]        Pt unsure of reaction    Jardiance [Empagliflozin] Other (See Comments)      Recurrent yeast infections, even with washout and rechallenge      Social History:  reports that she has never smoked. She has never used smokeless tobacco. She reports that she does not drink alcohol and does not use drugs.    Family History:      Family History  Problem Relation Age of Onset   Diabetes Other     Hypertension Other     Breast cancer Neg Hx     Colon cancer Neg Hx        Review of Systems: Review of Systems  Constitutional:  Negative for chills and fever.  HENT:  Negative for hearing loss.   Respiratory:  Negative for shortness of breath.   Cardiovascular:  Negative for chest pain.  Gastrointestinal:  Positive for abdominal pain and diarrhea. Negative for nausea and vomiting.  Genitourinary:  Negative for dysuria.  Musculoskeletal:  Negative for myalgias.  Skin:  Negative for rash.  Neurological:  Positive for headaches. Negative for dizziness.  Psychiatric/Behavioral:  Negative for depression.     Physical Exam BP (!) 147/78    Pulse 77    Temp 98.3 F (36.8 C)    Ht 5\' 4"  (1.626 m)    Wt 111 lb 9.6 oz (50.6 kg)    SpO2 100%    BMI 19.16 kg/m  CONSTITUTIONAL: No acute distress, well-nourished HEENT:  Normocephalic, atraumatic, extraocular motion intact. NECK: Trachea is midline, and there is no jugular venous distension.  RESPIRATORY:  Lungs are clear, and breath sounds are equal bilaterally. Normal respiratory effort without pathologic use of accessory  muscles. CARDIOVASCULAR: Heart is regular without murmurs, gallops, or rubs. GI: The abdomen is soft, nondistended, with minimal discomfort in the left groin.  Patient does have a reducible left inguinal hernia.  Her colon is palpable  as the patient is very thin but do not think there is bowel protrusion through the hernia.  No palpable right inguinal hernia and no palpable umbilical hernia. MUSCULOSKELETAL:  Normal muscle strength and tone in all four extremities.  No peripheral edema or cyanosis. SKIN: Skin turgor is normal. There are no pathologic skin lesions.  NEUROLOGIC:  Motor and sensation is grossly normal.  Cranial nerves are grossly intact. PSYCH:  Alert and oriented to person, place and time. Affect is normal.   Laboratory Analysis: Labs from 10/17/2021: Sodium 141, potassium 3.6, chloride 105, CO2 29, BUN 12, creatinine 0.96.  Total bilirubin 0.4, AST 17, ALT 11, alkaline phosphatase 74.  WBC 4.8, hemoglobin 10.5, hematocrit 32.2, platelets 290.   Imaging: CT scan abdomen/pelvis on 08/06/2021: IMPRESSION: 1. Signs of severe pancolitis identified with terminal ileitis. Favor inflammatory or infectious etiologies. Ulcerative colitis not excluded. No convincing evidence for pneumatosis, portal venous gas or abscess formation. 2. Status post cholecystectomy with mild intrahepatic biliary dilatation and increase caliber of the common bile duct. No signs of choledocholithiasis or mass.   U/S pelvis on 10/29/2021: IMPRESSION: Fat containing hernia in the left lower quadrant corresponding to the area of swelling.   Assessment and Plan: This is a 71 y.o. female with a left inguinal hernia.  --Patient is scheduled for robotic assisted left inguinal hernia repair.  Reviewed the surgery at length with her again and all of her questions have been answered.    Melvyn Neth, [facility]

## 2021-12-16 NOTE — Discharge Instructions (Addendum)
AMBULATORY SURGERY  DISCHARGE INSTRUCTIONS   The drugs that you were given will stay in your system until tomorrow so for the next 24 hours you should not:  Drive an automobile Make any legal decisions Drink any alcoholic beverage   You may resume regular meals tomorrow.  Today it is better to start with liquids and gradually work up to solid foods.  You may eat anything you prefer, but it is better to start with liquids, then soup and crackers, and gradually work up to solid foods.   Please notify your doctor immediately if you have any unusual bleeding, trouble breathing, redness and pain at the surgery site, drainage, fever, or pain not relieved by medication.    Additional Instructions:keep green band on for 4 days     Please contact your physician with any problems or Same Day Surgery at [phone], Monday through Friday 6 am to 4 pm, or [place] at [facility] number at [phone].

## 2021-12-17 ENCOUNTER — Encounter: Payer: Self-pay | Admitting: Surgery

## 2021-12-17 NOTE — Anesthesia Postprocedure Evaluation (Signed)
Anesthesia Post Note  Patient: MEMPHIS CRESWELL  Procedure(s) Performed: XI ROBOTIC ASSISTED INGUINAL HERNIA REPAIR WITH MESH (Left)  Patient location during evaluation: PACU Anesthesia Type: General Level of consciousness: awake and alert Pain management: pain level controlled Vital Signs Assessment: post-procedure vital signs reviewed and stable Respiratory status: spontaneous breathing, nonlabored ventilation, respiratory function stable and patient connected to nasal cannula oxygen Cardiovascular status: blood pressure returned to baseline and stable Postop Assessment: no apparent nausea or vomiting Anesthetic complications: no   No notable events documented.   Last Vitals:  Vitals:   12/16/21 1645 12/16/21 1705  BP: 120/61 122/88  Pulse: 80 83  Resp: 18 16  Temp: 36.7 C 36.7 C  SpO2: 100% 97%    Last Pain:  Vitals:   12/16/21 1705  TempSrc: Temporal  PainSc: 0-No pain                 Martha Clan

## 2021-12-18 ENCOUNTER — Telehealth: Payer: Self-pay | Admitting: Pharmacy Technician

## 2021-12-18 DIAGNOSIS — Z596 Low income: Secondary | ICD-10-CM

## 2021-12-18 NOTE — Progress Notes (Signed)
[facility])                                            [place] Team    12/18/2021  Ashley Gross [birth year] [id number]  Care coordination call placed to [name] in regard to [name] application.  Spoke to [name] who informs patient is APPROVED 12/16/21-11/01/22. She informs medication will ship based on last fill date in 2022 and going forward in 2023 with delivery to the provider's office. This process can take up to 42 days once the order is fulfilled.  [name], [place]  [phone]

## 2021-12-31 ENCOUNTER — Encounter: Payer: Self-pay | Admitting: Surgery

## 2021-12-31 ENCOUNTER — Ambulatory Visit (INDEPENDENT_AMBULATORY_CARE_PROVIDER_SITE_OTHER): Payer: PPO | Admitting: Surgery

## 2021-12-31 ENCOUNTER — Other Ambulatory Visit: Payer: Self-pay

## 2021-12-31 VITALS — BP 167/79 | HR 73 | Temp 98.3°F | Ht 64.0 in | Wt 113.8 lb

## 2021-12-31 DIAGNOSIS — Z09 Encounter for follow-up examination after completed treatment for conditions other than malignant neoplasm: Secondary | ICD-10-CM

## 2021-12-31 DIAGNOSIS — K4090 Unilateral inguinal hernia, without obstruction or gangrene, not specified as recurrent: Secondary | ICD-10-CM

## 2021-12-31 NOTE — Patient Instructions (Signed)
GENERAL POST-OPERATIVE ?PATIENT INSTRUCTIONS  ? ?WOUND CARE INSTRUCTIONS:  Keep a dry clean dressing on the wound if there is drainage. The initial bandage may be removed after 24 hours.  Once the wound has quit draining you may leave it open to air.  If clothing rubs against the wound or causes irritation and the wound is not draining you may cover it with a dry dressing during the daytime.  Try to keep the wound dry and avoid ointments on the wound unless directed to do so.  If the wound becomes bright red and painful or starts to drain infected material that is not clear, please contact your physician immediately.  If the wound is mildly pink and has a thick firm ridge underneath it, this is normal, and is referred to as a healing ridge.  This will resolve over the next 4-6 weeks. ? ?BATHING: ?You may shower if you have been informed of this by your surgeon. However, Please do not submerge in a tub, hot tub, or pool until incisions are completely sealed or have been told by your surgeon that you may do so. ? ?DIET:  You may eat any foods that you can tolerate.  It is a good idea to eat a high fiber diet and take in plenty of fluids to prevent constipation.  If you do become constipated you may want to take a mild laxative or take ducolax tablets on a daily basis until your bowel habits are regular.  Constipation can be very uncomfortable, along with straining, after recent surgery. ? ?ACTIVITY:  You are encouraged to cough and deep breath or use your incentive spirometer if you were given one, every 15-30 minutes when awake.  This will help prevent respiratory complications and low grade fevers post-operatively if you had a general anesthetic.  You may want to hug a pillow when coughing and sneezing to add additional support to the surgical area, if you had abdominal or chest surgery, which will decrease pain during these times.  You are encouraged to walk and engage in light activity for the next two weeks.  You  should not lift more than 20 pounds, until 01/27/2022 as it could put you at increased risk for complications.  Twenty pounds is roughly equivalent to a plastic bag of groceries. At that time- Listen to your body when lifting, if you have pain when lifting, stop and then try again in a few days. Soreness after doing exercises or activities of daily living is normal as you get back in to your normal routine. ? ?MEDICATIONS:  Try to take narcotic medications and anti-inflammatory medications, such as tylenol, ibuprofen, naprosyn, etc., with food.  This will minimize stomach upset from the medication.  Should you develop nausea and vomiting from the pain medication, or develop a rash, please discontinue the medication and contact your physician.  You should not drive, make important decisions, or operate machinery when taking narcotic pain medication. ? ?SUNBLOCK ?Use sun block to incision area over the next year if this area will be exposed to sun. This helps decrease scarring and will allow you avoid a permanent darkened area over your incision. ? ?QUESTIONS:  Please feel free to call our office if you have any questions, and we will be glad to assist you. [phone] ? ? ?

## 2021-12-31 NOTE — Progress Notes (Signed)
12/31/2021 ? ?HPI: ?LEJLA MOESER is a 71 y.o. female s/p robotic assisted left inguinal hernia repair on 12/16/2021.  Patient presents today for follow-up.  Reports that she has been doing very well and denies any significant pain, swelling, nausea, vomiting, constipation.  Reports only some discomfort in the abdomen after she has been sitting for a long time while at work but otherwise denies any worsening pain. ? ?Vital signs: ?BP (!) 167/79   Pulse 73   Temp 98.3 ?F (36.8 ?C) (Oral)   Ht 5\' 4"  (1.626 m)   Wt 113 lb 12.8 oz (51.6 kg)   SpO2 100%   BMI 19.53 kg/m?   ? ?Physical Exam: ?Constitutional: No acute distress ?Abdomen: Soft, nondistended, nontender to palpation.  Incisions are healing well and are clean, dry, intact.  No evidence of any hernia recurrence.  There is a little bit of firmness and swelling at the level of the hernia sac itself but no recurrence palpable. ? ?Assessment/Plan: ?This is a 71 y.o. female s/p robotic assisted left inguinal hernia repair. ? ?- Discussed with patient that the area of firmness and swelling in the left lower quadrant corresponds to the hernia sac from her hernia itself.  This will continue to improve as the healing process continues. ?- Otherwise the patient is healing very well without any complications at this point. ?- Follow-up as needed. ? ? ?Melvyn Neth, MD ?[facility]  ?

## 2022-01-01 ENCOUNTER — Telehealth: Payer: Self-pay | Admitting: Internal Medicine

## 2022-01-01 NOTE — Telephone Encounter (Signed)
Copied from [place] [id number]. Topic: Medicare AWV ?>> Jan 01, 2022 11:32 AM Mehlhaff-Coley, Hannah Beat wrote: ?Reason for CRM: LVM 01/01/22 AWV appt changed to 01/14/22 at 11:15am khc.  Please confirm appt change and r/s if need. Khc ?

## 2022-01-06 ENCOUNTER — Ambulatory Visit: Payer: Self-pay | Admitting: Pharmacist

## 2022-01-06 NOTE — Patient Instructions (Signed)
Hi Debbie,  ? ?Unfortunately, I am being asked to quickly transition into another role within the health system, so I am unable to keep our next appointment. Please continue to follow up with your primary care provider as scheduled.  ? ?As a reminder -  ? ?The [employer] Patient Assistance Program for [name] has an auto-refill program where you do not need to call and request a refill each time. You should receive your medication shipment before you run out of your medication. However, if you have any issues or need assistance, you can call them at [phone]. They are available Monday though Friday 8:00 AM - 8:00 PM.  ? ?The [employer] prescription savings program has an auto-refill program for Symbicort where you do not need to call and request a refill each time. You should receive your medication shipment before you run out of your medication. However, if you have any issues or need assistance, you can call them call [phone]. They are available Monday though Friday 9:00 AM - 6:00 PM.  ? ?For prescription refills for Creon through the [facility], call [phone]. Once you have been enrolled in the program, your prescriptions can easily be refilled by contacting the phone number above Monday though Friday 8:00 AM - 8:00 PM.  ? ? ? ?It has been a pleasure working with you! ? ?[name] Darnelle Maffucci, PharmD ? ?

## 2022-01-06 NOTE — Chronic Care Management (AMB) (Signed)
?  Chronic Care Management  ? ?Note ? ?01/06/2022 ?Name: Ashley Gross MRN: [id number] DOB: 08-29-1951 ? ? ? ?Closing pharmacy CCM case at this time. Will collaborate with Care Guide to outreach to schedule follow up with RN CM. Patient has clinic contact information for future questions or concerns.  ? ?[name] Darnelle Maffucci, PharmD, [place], CPP ?Clinical Pharmacist ?Therapist, music at [employer] ?[phone] ? ?

## 2022-01-08 ENCOUNTER — Ambulatory Visit: Payer: PPO

## 2022-01-12 DIAGNOSIS — D329 Benign neoplasm of meninges, unspecified: Secondary | ICD-10-CM | POA: Diagnosis not present

## 2022-01-12 DIAGNOSIS — E236 Other disorders of pituitary gland: Secondary | ICD-10-CM | POA: Diagnosis not present

## 2022-01-12 DIAGNOSIS — G4700 Insomnia, unspecified: Secondary | ICD-10-CM | POA: Diagnosis not present

## 2022-01-12 DIAGNOSIS — R519 Headache, unspecified: Secondary | ICD-10-CM | POA: Diagnosis not present

## 2022-01-12 DIAGNOSIS — I729 Aneurysm of unspecified site: Secondary | ICD-10-CM | POA: Diagnosis not present

## 2022-01-14 ENCOUNTER — Other Ambulatory Visit: Payer: Self-pay | Admitting: Student

## 2022-01-14 ENCOUNTER — Ambulatory Visit: Payer: PPO

## 2022-01-14 ENCOUNTER — Other Ambulatory Visit (HOSPITAL_COMMUNITY): Payer: Self-pay | Admitting: Student

## 2022-01-14 DIAGNOSIS — D329 Benign neoplasm of meninges, unspecified: Secondary | ICD-10-CM

## 2022-01-14 DIAGNOSIS — I729 Aneurysm of unspecified site: Secondary | ICD-10-CM

## 2022-01-22 ENCOUNTER — Encounter: Payer: Self-pay | Admitting: Internal Medicine

## 2022-01-22 ENCOUNTER — Other Ambulatory Visit: Payer: Self-pay

## 2022-01-22 ENCOUNTER — Ambulatory Visit (INDEPENDENT_AMBULATORY_CARE_PROVIDER_SITE_OTHER): Payer: PPO | Admitting: Internal Medicine

## 2022-01-22 VITALS — BP 132/64 | HR 77 | Temp 98.3°F | Ht 64.0 in | Wt 112.8 lb

## 2022-01-22 DIAGNOSIS — M7989 Other specified soft tissue disorders: Secondary | ICD-10-CM

## 2022-01-22 DIAGNOSIS — I10 Essential (primary) hypertension: Secondary | ICD-10-CM | POA: Diagnosis not present

## 2022-01-22 DIAGNOSIS — E7800 Pure hypercholesterolemia, unspecified: Secondary | ICD-10-CM | POA: Diagnosis not present

## 2022-01-22 DIAGNOSIS — K219 Gastro-esophageal reflux disease without esophagitis: Secondary | ICD-10-CM | POA: Diagnosis not present

## 2022-01-22 DIAGNOSIS — E237 Disorder of pituitary gland, unspecified: Secondary | ICD-10-CM | POA: Diagnosis not present

## 2022-01-22 DIAGNOSIS — D329 Benign neoplasm of meninges, unspecified: Secondary | ICD-10-CM

## 2022-01-22 DIAGNOSIS — J4520 Mild intermittent asthma, uncomplicated: Secondary | ICD-10-CM

## 2022-01-22 DIAGNOSIS — L4050 Arthropathic psoriasis, unspecified: Secondary | ICD-10-CM | POA: Diagnosis not present

## 2022-01-22 DIAGNOSIS — F439 Reaction to severe stress, unspecified: Secondary | ICD-10-CM

## 2022-01-22 DIAGNOSIS — E118 Type 2 diabetes mellitus with unspecified complications: Secondary | ICD-10-CM

## 2022-01-22 DIAGNOSIS — Z794 Long term (current) use of insulin: Secondary | ICD-10-CM

## 2022-01-22 DIAGNOSIS — D649 Anemia, unspecified: Secondary | ICD-10-CM

## 2022-01-22 DIAGNOSIS — I671 Cerebral aneurysm, nonruptured: Secondary | ICD-10-CM | POA: Diagnosis not present

## 2022-01-22 DIAGNOSIS — F320 Major depressive disorder, single episode, mild: Secondary | ICD-10-CM | POA: Diagnosis not present

## 2022-01-22 DIAGNOSIS — K51419 Inflammatory polyps of colon with unspecified complications: Secondary | ICD-10-CM | POA: Diagnosis not present

## 2022-01-22 DIAGNOSIS — G479 Sleep disorder, unspecified: Secondary | ICD-10-CM

## 2022-01-22 NOTE — Progress Notes (Signed)
Patient ID: Ashley Gross, female   DOB: Jun 28, 1951, 71 y.o.   MRN: [id number] ? ? ?Subjective:  ? ? Patient ID: Ashley Gross, female    DOB: 03/28/1951, 71 y.o.   MRN: [id number] ? ?This visit occurred during the SARS-CoV-2 public health emergency.  Safety protocols were in place, including screening questions prior to the visit, additional usage of staff PPE, and extensive cleaning of exam room while observing appropriate contact time as indicated for disinfecting solutions.  ? ?Patient here for a scheduled follow up. .  ? ?HPI ?Here to follow up regarding her blood pressure, cholesterol and increased stress.  S/p recent hernia repair.  Did well with surgery.  States since surgery, blood pressure has been elevated.  Reports outside readings;  086V/784O systolic.  Has noticed some minimal ankle swelling. No increased abdominal pain.  Bowels moving.  No chest pain or sob reported.  Saw neurology 01/12/22 - f/u chronic daily headaches.  Recommended magnesium.  Also gabapentin dose being adjusted to help with sleep, which may help above as well.  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 MRI scheduled. Has good family support.  Appears to be handling stress.   ? ? ?Past Medical History:  ?Diagnosis Date  ? Abnormal liver function test 10/06/2012  ? Anemia, unspecified   ? Asthma   ? B12 deficiency 02/11/2019  ? Bleeding internal hemorrhoids 09/13/2017  ? Choledocholithiasis 06/29/2012  ? Formatting of this note might be different from the original. Dilated CBD on abdominal imaging 04/2012  ? Chronic diarrhea 02/16/2013  ? Colitis   ? Complication of anesthesia   ? asthma attack after shoulder surgery  ? Diabetes mellitus ([place])   ? type II  ? Esophagitis   ? GERD (gastroesophageal reflux disease)   ? Heart murmur   ? for years- nothing to be concerned about.  ? History of kidney stones   ? Hx of arteriovenous malformation (AVM)   ? Hypertension    ? IBS (irritable bowel syndrome)   ? Pernicious anemia   ? Personal history of colonic polyps 02/10/2013  ? 02/08/13 colonoscopy - question of rectal varices, two 3-24m polyps in the sigmoid colon, mass at the hepatic flexure, one 545mpolyp at the hepatic flexure, nodule at the ileocecal valve, congested mucusa in the entire colon, flattened villi mucosa in the terminal ileum.    ? Pneumonia   ? [place]  ? s/p penicillamine, plaquenil, MTX, sulfasalazine.  s/p Embrel, Humira,.  Iritis.    ? Pure hypercholesterolemia   ? Rectal bleeding 07/15/2017  ? ?Past Surgical History:  ?Procedure Laterality Date  ? ABDOMINAL HYSTERECTOMY  1981  ? ovaries left in place  ? ANKLE SURGERY    ? right  ? BUNIONECTOMY    ? CHOLECYSTECTOMY  1989  ? COLONOSCOPY WITH PROPOFOL N/A 12/27/2017  ? Procedure: COLONOSCOPY WITH PROPOFOL;  Surgeon: VaLin LandsmanMD;  [facility];  Service: Gastroenterology;  Laterality: N/A;  ? COLONOSCOPY WITH PROPOFOL N/A 01/04/2019  ? Procedure: COLONOSCOPY WITH PROPOFOL;  Surgeon: VaLin LandsmanMD;  [place];  Laterality: N/A;  Diabetic - oral and injectable  ? COLONOSCOPY WITH PROPOFOL N/A 07/24/2021  ? Procedure: COLONOSCOPY WITH PROPOFOL;  Surgeon: VaLin LandsmanMD;  [place] Service: [place];  Laterality: N/A;  Diabetic  ? ESOPHAGOGASTRODUODENOSCOPY (EGD) WITH PROPOFOL N/A 12/27/2017  ?  Procedure: ESOPHAGOGASTRODUODENOSCOPY (EGD) WITH PROPOFOL;  Surgeon: Lin Landsman, MD;  Location[facility] END[facility]COPY;  Service: Gastroenterology;  Laterality: N/A;  ? HAND SURGERY    ? right  ? HEEL SPUR SURGERY    ? IR 3D INDEPENDENT WKST  10/01/2021  ? IR ANGIO INTRA EXTRACRAN SEL COM CAROTID INNOMINATE UNI L MOD SED  10/01/2021  ? IR ANGIO INTRA EXTRACRAN SEL INTERNAL CAROTID UNI R MOD SED  10/01/2021  ? IR ANGIO VERTEBRAL SEL VERTEBRAL BILAT MOD SED  10/01/2021  ? IR CT HEAD LTD  10/01/2021  ? IR  TRANSCATH/EMBOLIZ  10/01/2021  ? IR US GUIDE VASC ACCESS RIGHT  10/01/2021  ? NOSE SURGERY    ? x2  ? POLYPECTOMY  01/04/2019  ? Procedure: POLYPECTOMY;  Surgeon: Lin Landsman, MD;  Location: [place];  Service: Endoscopy;;  ? POLYPECTOMY N/A 07/24/2021  ? Procedure: POLYPECTOMY;  Surgeon: Lin Landsman, MD;  Location: [place];  Service: Endoscopy;  Laterality: N/A;  ? RADIOLOGY WITH ANESTHESIA N/A 10/01/2021  ? Procedure: EMOBILIZATION WITH ANESTHESIA;  Surgeon: Pedro Earls, MD;  Location: [place];  Service: Radiology;  Laterality: N/A;  ? SHOULDER ARTHROSCOPY WITH OPEN ROTATOR CUFF REPAIR Left 09/20/2018  ? Procedure: SHOULDER ARTHROSCOPY WITH OPEN ROTATOR CUFF REPAIR;  Surgeon: Corky Mull, MD;  Location: [facility];  Service: Orthopedics;  Laterality: Left;  ? SHOULDER CLOSED REDUCTION Left 12/21/2018  ? Procedure: MANIPULATION UNDER ANESTHESIA WITH STEROID INJECTION;  Surgeon: Corky Mull, MD;  Location: [place];  Service: Orthopedics;  Laterality: Left;  Diabetic - insulin and oral meds  ? UMBILICAL HERNIA REPAIR    ? XI ROBOTIC ASSISTED INGUINAL HERNIA REPAIR WITH MESH Left 12/16/2021  ? Procedure: XI ROBOTIC ASSISTED INGUINAL HERNIA REPAIR WITH MESH;  Surgeon: Olean Ree, MD;  Location: [facility];  Service: General;  Laterality: Left;  ? ?Family History  ?Problem Relation Age of Onset  ? Diabetes Other   ? Hypertension Other   ? Breast cancer Neg Hx   ? Colon cancer Neg Hx   ? ?Social History  ? ?Socioeconomic History  ? Marital status: Widowed  ?  Spouse name: Not on file  ? Number of children: 2  ? Years of education: Not on file  ? Highest education level: Not on file  ?Occupational History  ? Not on file  ?Tobacco Use  ? Smoking status: Never  ? Smokeless tobacco: Never  ?Vaping Use  ? Vaping Use: Never used  ?Substance and Sexual Activity  ? Alcohol use: Never  ? Drug use: Never  ? Sexual activity: Not Currently  ?Other Topics Concern  ? Not  on file  ?Social History Narrative  ? She is married and has two children  ? Grand daughter lives with her.  ? ?Social Determinants of Health  ? ?Financial Resource Strain: Medium Risk  ? Difficulty of Paying Living Expenses: Somewhat hard  ?Food Insecurity: Not on file  ?Transportation Needs: Not on file  ?Physical Activity: Not on file  ?Stress: No Stress Concern Present  ? Feeling of Stress : Only a little  ?Social Connections: Not on file  ? ? ? ?Review of Systems  ?Constitutional:  Negative for appetite change and unexpected weight change.  ?     Weight stable from last check.   ?HENT:  Negative for congestion and sinus pressure.   ?Respiratory:  Negative for cough, chest tightness and shortness of breath.   ?Cardiovascular:  Negative for  chest pain and palpitations.  ?     Minimal ankle swelling   ?Gastrointestinal:  Negative for abdominal pain, diarrhea, nausea and vomiting.  ?Genitourinary:  Negative for difficulty urinating and dysuria.  ?Musculoskeletal:  Negative for joint swelling and myalgias.  ?Skin:  Negative for color change and rash.  ?Neurological:  Negative for dizziness.  ?     Being followed by neurology for headaches.   ?Psychiatric/Behavioral:  Negative for agitation and dysphoric mood.   ? ?   ?Objective:  ?  ? ?BP 132/64 (BP Location: Left Arm, Patient Position: Sitting, Cuff Size: Normal)   Pulse 77   Temp 98.3 ?F (36.8 ?C) (Oral)   Ht '5\' 4"'$  (1.626 m)   Wt 112 lb 12.8 oz (51.2 kg)   SpO2 99%   BMI 19.36 kg/m?  ?Wt Readings from Last 3 Encounters:  ?01/22/22 112 lb 12.8 oz (51.2 kg)  ?12/31/21 113 lb 12.8 oz (51.6 kg)  ?12/16/21 106 lb (48.1 kg)  ? ? ?Physical Exam ?Vitals reviewed.  ?Constitutional:   ?   General: She is not in acute distress. ?   Appearance: Normal appearance.  ?HENT:  ?   Head: Normocephalic and atraumatic.  ?   Right Ear: External ear normal.  ?   Left Ear: External ear normal.  ?Eyes:  ?   General: No scleral icterus.    ?   Right eye: No discharge.     ?   Left  eye: No discharge.  ?   Conjunctiva/sclera: Conjunctivae normal.  ?Neck:  ?   Thyroid: No thyromegaly.  ?Cardiovascular:  ?   Rate and Rhythm: Normal rate and regular rhythm.  ?Pulmonary:  ?   Effort: No respirato

## 2022-01-23 ENCOUNTER — Ambulatory Visit
Admission: RE | Admit: 2022-01-23 | Discharge: 2022-01-23 | Disposition: A | Discharge status: Home / Self Care | Payer: PPO | Source: Ambulatory Visit | Attending: Student | Admitting: Student

## 2022-01-23 DIAGNOSIS — I729 Aneurysm of unspecified site: Secondary | ICD-10-CM | POA: Insufficient documentation

## 2022-01-23 DIAGNOSIS — I671 Cerebral aneurysm, nonruptured: Secondary | ICD-10-CM | POA: Diagnosis not present

## 2022-01-23 DIAGNOSIS — R590 Localized enlarged lymph nodes: Secondary | ICD-10-CM | POA: Diagnosis not present

## 2022-01-23 DIAGNOSIS — D329 Benign neoplasm of meninges, unspecified: Secondary | ICD-10-CM | POA: Insufficient documentation

## 2022-01-23 MED ORDER — GADOBUTROL 1 MMOL/ML IV SOLN
5.0000 mL | Freq: Once | INTRAVENOUS | Status: AC | PRN
  Administered 2022-01-23: 5 mL via INTRAVENOUS

## 2022-01-25 ENCOUNTER — Encounter: Payer: Self-pay | Admitting: Internal Medicine

## 2022-01-25 DIAGNOSIS — D649 Anemia, unspecified: Secondary | ICD-10-CM

## 2022-01-25 HISTORY — DX: Anemia, unspecified: D64.9

## 2022-01-25 NOTE — Assessment & Plan Note (Signed)
Increased stress.  Overall appears to be relatively stable currently.  Follow.  Has good support.  ?

## 2022-01-25 NOTE — Assessment & Plan Note (Signed)
S/p diagnostic cerebral angiogram with endovascular aneurysm embolization.  Just evaluated by neurology.  MRI planned.  Per neuro, recommended f/u with NSU.   ?

## 2022-01-25 NOTE — Assessment & Plan Note (Signed)
Just evaluated by neurology.  Has f/u MRI planned.   ?

## 2022-01-25 NOTE — Assessment & Plan Note (Signed)
No upper symptoms.  On omeprazole.  

## 2022-01-25 NOTE — Assessment & Plan Note (Signed)
Continue crestor.  Follow lipid panel and liver function tests.  

## 2022-01-25 NOTE — Assessment & Plan Note (Signed)
Minimal ankle swelling.  Discussed amlodipine.  Appears to be better in am. Worse as day progresses.  Elevate legs.  Compression hose.  Follow.  Hold on changing medication.  No evidence of volume overload on exam.  ?

## 2022-01-25 NOTE — Assessment & Plan Note (Signed)
Decreased hgb.  Recheck cbc with next labs.   ?

## 2022-01-25 NOTE — Assessment & Plan Note (Signed)
Breathing is stable.  Will need to continue to use inhalers.  Use day of surgery and peri op period.   ?

## 2022-01-25 NOTE — Assessment & Plan Note (Signed)
Saw neurology.  F/u MRI 01/23/22.  ?

## 2022-01-25 NOTE — Assessment & Plan Note (Signed)
On gabapentin.  Neurology adjusting dose.  Follow  ?

## 2022-01-25 NOTE — Assessment & Plan Note (Signed)
S/p colonoscopy 07/24/21 as outlined.  Continues f/u with GI.  ?

## 2022-01-25 NOTE — Assessment & Plan Note (Signed)
Blood pressure on checks here - ok.  Her outside checks as outlined.  Hold on making changes in her medication.  Continue amlodipine and losartan.  Follow pressures.  Follow metabolic panel.  ?

## 2022-01-25 NOTE — Assessment & Plan Note (Signed)
Stable.  Followed by Dr Kernodle.   

## 2022-01-25 NOTE — Assessment & Plan Note (Signed)
Trying to cope with her husband's passing. Has good support.  Follow.  ?

## 2022-01-25 NOTE — Assessment & Plan Note (Signed)
Has lost a lot of weight.  On no medication now.  Follow met b and a1c.   ?

## 2022-01-26 ENCOUNTER — Telehealth: Payer: Self-pay | Admitting: *Deleted

## 2022-01-26 NOTE — Telephone Encounter (Signed)
Tried to reach patient to make her aware her tresiba is ready for pick up placed in fridge and labeled. ?

## 2022-01-27 NOTE — Telephone Encounter (Signed)
Patient returned office phone call and note was read. She will come and pick up this afternoon or tomorrow. ?

## 2022-01-29 ENCOUNTER — Other Ambulatory Visit: Payer: Self-pay | Admitting: Internal Medicine

## 2022-02-02 ENCOUNTER — Ambulatory Visit: Payer: PPO

## 2022-02-02 ENCOUNTER — Telehealth: Payer: Self-pay

## 2022-02-02 NOTE — Telephone Encounter (Signed)
Unable to reach patient for scheduled AWV. Left message to reschedule.  ?

## 2022-02-03 ENCOUNTER — Telehealth: Payer: Self-pay

## 2022-02-03 NOTE — Telephone Encounter (Signed)
Pt picked up pt assistance medication ?

## 2022-02-20 ENCOUNTER — Other Ambulatory Visit (INDEPENDENT_AMBULATORY_CARE_PROVIDER_SITE_OTHER): Payer: PPO

## 2022-02-20 DIAGNOSIS — D649 Anemia, unspecified: Secondary | ICD-10-CM

## 2022-02-20 DIAGNOSIS — Z794 Long term (current) use of insulin: Secondary | ICD-10-CM | POA: Diagnosis not present

## 2022-02-20 DIAGNOSIS — E7800 Pure hypercholesterolemia, unspecified: Secondary | ICD-10-CM | POA: Diagnosis not present

## 2022-02-20 DIAGNOSIS — E118 Type 2 diabetes mellitus with unspecified complications: Secondary | ICD-10-CM

## 2022-02-20 DIAGNOSIS — I10 Essential (primary) hypertension: Secondary | ICD-10-CM | POA: Diagnosis not present

## 2022-02-20 LAB — CBC WITH DIFFERENTIAL/PLATELET
Basophils Absolute: 0 10*3/uL (ref 0.0–0.1)
Basophils Relative: 0.8 % (ref 0.0–3.0)
Eosinophils Absolute: 0.1 10*3/uL (ref 0.0–0.7)
Eosinophils Relative: 1.3 % (ref 0.0–5.0)
HCT: 29.5 % — ABNORMAL LOW (ref 36.0–46.0)
Hemoglobin: 9.5 g/dL — ABNORMAL LOW (ref 12.0–15.0)
Lymphocytes Relative: 24 % (ref 12.0–46.0)
Lymphs Abs: 1.3 10*3/uL (ref 0.7–4.0)
MCHC: 32 g/dL (ref 30.0–36.0)
MCV: 84.4 fl (ref 78.0–100.0)
Monocytes Absolute: 0.4 10*3/uL (ref 0.1–1.0)
Monocytes Relative: 7.1 % (ref 3.0–12.0)
Neutro Abs: 3.6 10*3/uL (ref 1.4–7.7)
Neutrophils Relative %: 66.8 % (ref 43.0–77.0)
Platelets: 306 10*3/uL (ref 150.0–400.0)
RBC: 3.5 Mil/uL — ABNORMAL LOW (ref 3.87–5.11)
RDW: 14.3 % (ref 11.5–15.5)
WBC: 5.3 10*3/uL (ref 4.0–10.5)

## 2022-02-20 LAB — BASIC METABOLIC PANEL
BUN: 11 mg/dL (ref 6–23)
CO2: 29 mEq/L (ref 19–32)
Calcium: 7.9 mg/dL — ABNORMAL LOW (ref 8.4–10.5)
Chloride: 108 mEq/L (ref 96–112)
Creatinine, Ser: 0.81 mg/dL (ref 0.40–1.20)
GFR: 73.21 mL/min (ref >60.00)
Glucose, Bld: 54 mg/dL — ABNORMAL LOW (ref 70–99)
Potassium: 3.8 mEq/L (ref 3.5–5.1)
Sodium: 143 mEq/L (ref 135–145)

## 2022-02-20 LAB — LIPID PANEL
Cholesterol: 102 mg/dL (ref 0–200)
HDL: 42.3 mg/dL (ref >39.00)
LDL Cholesterol: 47 mg/dL (ref 0–99)
NonHDL: 59.68
Total CHOL/HDL Ratio: 2
Triglycerides: 65 mg/dL (ref 0.0–149.0)
VLDL: 13 mg/dL (ref 0.0–40.0)

## 2022-02-20 LAB — HEPATIC FUNCTION PANEL
ALT: 11 U/L (ref 0–35)
AST: 18 U/L (ref 0–37)
Albumin: 3.1 g/dL — ABNORMAL LOW (ref 3.5–5.2)
Alkaline Phosphatase: 97 U/L (ref 39–117)
Bilirubin, Direct: 0.1 mg/dL (ref 0.0–0.3)
Total Bilirubin: 0.3 mg/dL (ref 0.2–1.2)
Total Protein: 5.3 g/dL — ABNORMAL LOW (ref 6.0–8.3)

## 2022-02-20 LAB — HEMOGLOBIN A1C: Hgb A1c MFr Bld: 5.6 % (ref 4.6–6.5)

## 2022-02-20 LAB — FERRITIN: Ferritin: 21.9 ng/mL (ref 10.0–291.0)

## 2022-02-26 ENCOUNTER — Encounter: Payer: Self-pay | Admitting: Internal Medicine

## 2022-02-26 ENCOUNTER — Ambulatory Visit (INDEPENDENT_AMBULATORY_CARE_PROVIDER_SITE_OTHER): Payer: PPO | Admitting: Internal Medicine

## 2022-02-26 DIAGNOSIS — J4520 Mild intermittent asthma, uncomplicated: Secondary | ICD-10-CM | POA: Diagnosis not present

## 2022-02-26 DIAGNOSIS — D329 Benign neoplasm of meninges, unspecified: Secondary | ICD-10-CM | POA: Diagnosis not present

## 2022-02-26 DIAGNOSIS — E7800 Pure hypercholesterolemia, unspecified: Secondary | ICD-10-CM | POA: Diagnosis not present

## 2022-02-26 DIAGNOSIS — I671 Cerebral aneurysm, nonruptured: Secondary | ICD-10-CM | POA: Diagnosis not present

## 2022-02-26 DIAGNOSIS — E118 Type 2 diabetes mellitus with unspecified complications: Secondary | ICD-10-CM

## 2022-02-26 DIAGNOSIS — L4050 Arthropathic psoriasis, unspecified: Secondary | ICD-10-CM

## 2022-02-26 DIAGNOSIS — F439 Reaction to severe stress, unspecified: Secondary | ICD-10-CM

## 2022-02-26 DIAGNOSIS — I10 Essential (primary) hypertension: Secondary | ICD-10-CM

## 2022-02-26 DIAGNOSIS — K51419 Inflammatory polyps of colon with unspecified complications: Secondary | ICD-10-CM

## 2022-02-26 DIAGNOSIS — Z794 Long term (current) use of insulin: Secondary | ICD-10-CM

## 2022-02-26 DIAGNOSIS — K219 Gastro-esophageal reflux disease without esophagitis: Secondary | ICD-10-CM | POA: Diagnosis not present

## 2022-02-26 DIAGNOSIS — D649 Anemia, unspecified: Secondary | ICD-10-CM | POA: Diagnosis not present

## 2022-02-26 DIAGNOSIS — E237 Disorder of pituitary gland, unspecified: Secondary | ICD-10-CM

## 2022-02-26 NOTE — Progress Notes (Signed)
Patient ID: Ashley Gross, female   DOB: 10-08-1951, 71 y.o.   MRN: [id number] ? ? ?Subjective:  ? ? Patient ID: Ashley Gross, female    DOB: 09-04-1951, 71 y.o.   MRN: [id number] ? ?This visit occurred during the SARS-CoV-2 public health emergency.  Safety protocols were in place, including screening questions prior to the visit, additional usage of staff PPE, and extensive cleaning of exam room while observing appropriate contact time as indicated for disinfecting solutions.  ? ?Patient here for a scheduled follow up.  ? ?Chief Complaint  ?Patient presents with  ? Follow-up  ?  1 mo f/u   ? Hypertension  ? Diabetes  ? .  ? ?HPI ?Has seen neurology.  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 Laboratory findings have been unremarkable in the past. Recommended NSU referral.  Appt scheduled 03/10/22.  She is s/p recent hernia repair.  Did well with surgery.  Noticed Easter Sunday - nausea and stomach spasm.  Still having intermittent episodes diarrhea.  Taking imodium.  Also still taking creon - cannot tell difference since being on creon.  Also having rectal bleeding - blood and mucus.  Eating.  No vomiting.  No chest pain.  Breathing stable.  No increased cough or congestion. Discussed labs.  Low calcium.   ? ? ?Past Medical History:  ?Diagnosis Date  ? Abnormal liver function test 10/06/2012  ? Anemia, unspecified   ? Asthma   ? B12 deficiency 02/11/2019  ? Bleeding internal hemorrhoids 09/13/2017  ? Choledocholithiasis 06/29/2012  ? Formatting of this note might be different from the original. Dilated CBD on abdominal imaging 04/2012  ? Chronic diarrhea 02/16/2013  ? Colitis   ? Complication of anesthesia   ? asthma attack after shoulder surgery  ? [place]   ? type II  ? Esophagitis   ? GERD (gastroesophageal reflux disease)   ? Heart murmur   ? for years- nothing to be concerned about.  ? History of kidney stones   ? Hx of arteriovenous malformation (AVM)   ?  Hypertension   ? IBS (irritable bowel syndrome)   ? Pernicious anemia   ? Personal history of colonic polyps 02/10/2013  ? 02/08/13 colonoscopy - question of rectal varices, two 3-89mpolyps in the sigmoid colon, mass at the hepatic flexure, one 23m99molyp at the hepatic flexure, nodule at the ileocecal valve, congested mucusa in the entire colon, flattened villi mucosa in the terminal ileum.    ? Pneumonia   ? [place] ? s/p penicillamine, plaquenil, MTX, sulfasalazine.  s/p Embrel, Humira,.  Iritis.    ? Pure hypercholesterolemia   ? Rectal bleeding 07/15/2017  ? ?Past Surgical History:  ?Procedure Laterality Date  ? ABDOMINAL HYSTERECTOMY  1981  ? ovaries left in place  ? ANKLE SURGERY    ? right  ? BUNIONECTOMY    ? CHOLECYSTECTOMY  1989  ? COLONOSCOPY WITH PROPOFOL N/A 12/27/2017  ? Procedure: COLONOSCOPY WITH PROPOFOL;  Surgeon: VanLin LandsmanD;  [facility];  Service: Gastroenterology;  Laterality: N/A;  ? COLONOSCOPY WITH PROPOFOL N/A 01/04/2019  ? Procedure: COLONOSCOPY WITH PROPOFOL;  Surgeon: VanLin LandsmanD;  [place];  Laterality: N/A;  Diabetic - oral and injectable  ? COLONOSCOPY WITH PROPOFOL N/A 07/24/2021  ? Procedure: COLONOSCOPY WITH PROPOFOL;  Surgeon: VanLin LandsmanD;  [place];  Laterality: N/A;  Diabetic  ? ESOPHAGOGASTRODUODENOSCOPY (EGD) WITH PROPOFOL N/A 12/27/2017  ? Procedure: ESOPHAGOGASTRODUODENOSCOPY (EGD) WITH PROPOFOL;  Surgeon: Lin Landsman, MD;  [facility]COPY;  Service: Gastroenterology;  Laterality: N/A;  ? HAND SURGERY    ? right  ? HEEL SPUR SURGERY    ? IR 3D INDEPENDENT WKST  10/01/2021  ? IR ANGIO INTRA EXTRACRAN SEL COM CAROTID INNOMINATE UNI L MOD SED  10/01/2021  ? IR ANGIO INTRA EXTRACRAN SEL INTERNAL CAROTID UNI R MOD SED  10/01/2021  ? IR ANGIO VERTEBRAL SEL VERTEBRAL BILAT MOD SED  10/01/2021  ? IR CT HEAD LTD   10/01/2021  ? IR TRANSCATH/EMBOLIZ  10/01/2021  ? IR US GUIDE VASC ACCESS RIGHT  10/01/2021  ? NOSE SURGERY    ? x2  ? POLYPECTOMY  01/04/2019  ? Procedure: POLYPECTOMY;  Surgeon: Lin Landsman, MD;  Location: [place];  Service: Endoscopy;;  ? POLYPECTOMY N/A 07/24/2021  ? Procedure: POLYPECTOMY;  Surgeon: Lin Landsman, MD;  Location: [place];  Service: Endoscopy;  Laterality: N/A;  ? RADIOLOGY WITH ANESTHESIA N/A 10/01/2021  ? Procedure: EMOBILIZATION WITH ANESTHESIA;  Surgeon: Pedro Earls, MD;  Location: [place];  Service: Radiology;  Laterality: N/A;  ? SHOULDER ARTHROSCOPY WITH OPEN ROTATOR CUFF REPAIR Left 09/20/2018  ? Procedure: SHOULDER ARTHROSCOPY WITH OPEN ROTATOR CUFF REPAIR;  Surgeon: Corky Mull, MD;  Location: [facility];  Service: Orthopedics;  Laterality: Left;  ? SHOULDER CLOSED REDUCTION Left 12/21/2018  ? Procedure: MANIPULATION UNDER ANESTHESIA WITH STEROID INJECTION;  Surgeon: Corky Mull, MD;  Location: [place];  Service: Orthopedics;  Laterality: Left;  Diabetic - insulin and oral meds  ? UMBILICAL HERNIA REPAIR    ? XI ROBOTIC ASSISTED INGUINAL HERNIA REPAIR WITH MESH Left 12/16/2021  ? Procedure: XI ROBOTIC ASSISTED INGUINAL HERNIA REPAIR WITH MESH;  Surgeon: Olean Ree, MD;  Location: [facility];  Service: General;  Laterality: Left;  ? ?Family History  ?Problem Relation Age of Onset  ? Diabetes Other   ? Hypertension Other   ? Breast cancer Neg Hx   ? Colon cancer Neg Hx   ? ?Social History  ? ?Socioeconomic History  ? Marital status: Widowed  ?  Spouse name: Not on file  ? Number of children: 2  ? Years of education: Not on file  ? Highest education level: Not on file  ?Occupational History  ? Not on file  ?Tobacco Use  ? Smoking status: Never  ? Smokeless tobacco: Never  ?Vaping Use  ? Vaping Use: Never used  ?Substance and Sexual Activity  ? Alcohol use: Never  ? Drug use: Never  ? Sexual activity: Not Currently  ?Other  Topics Concern  ? Not on file  ?Social History Narrative  ? She is married and has two children  ? Grand daughter lives with her.  ? ?Social Determinants of Health  ? ?Financial Resource Strain: Medium Risk  ? Difficulty of Paying Living Expenses: Somewhat hard  ?Food Insecurity: Not on file  ?Transportation Needs: Not on file  ?Physical Activity: Not on file  ?Stress: Not on file  ?Social Connections: Not on file  ? ? ? ?Review of Systems  ?Constitutional:  Positive for fatigue.  ?     Weight stable.    ?HENT:  Negative for congestion and sinus pressure.   ?Respiratory:  Negative for cough, chest tightness and shortness of breath.   ?Cardiovascular:  Negative for chest pain, palpitations and leg swelling.  ?  Gastrointestinal:  Positive for diarrhea. Negative for vomiting.  ?     Abdominal  discomfort.   ?Genitourinary:  Negative for difficulty urinating and dysuria.  ?Musculoskeletal:  Negative for joint swelling and myalgias.  ?Skin:  Negative for color change and rash.  ?Neurological:  Negative for headaches.  ?     Some occasional light headedness.   ?Psychiatric/Behavioral:  Negative for agitation and dysphoric mood.   ? ?   ?Objective:  ?  ? ?BP 110/60 (BP Location: Left Arm, Patient Position: Sitting, Cuff Size: Small)   Pulse 70   Temp 97.6 ?F (36.4 ?C) (Temporal)   Resp 13   Ht '5\' 4"'$  (1.626 m)   Wt 113 lb (51.3 kg)   SpO2 99%   BMI 19.40 kg/m?  ?Wt Readings from Last 3 Encounters:  ?02/26/22 113 lb (51.3 kg)  ?01/22/22 112 lb 12.8 oz (51.2 kg)  ?12/31/21 113 lb 12.8 oz (51.6 kg)  ? ? ?Physical Exam ?Vitals reviewed.  ?Constitutional:   ?   General: She is not in acute distress. ?   Appearance: Normal appearance.  ?HENT:  ?   Head: Normocephalic and atraumatic.  ?   Right Ear: External ear normal.  ?   Left Ear: External ear normal.  ?Eyes:  ?   General: No scleral icterus.    ?   Right eye: No discharge.     ?   Left eye: No discharge.  ?   Conjunctiva/sclera: Conjunctivae normal.  ?Neck:  ?    Thyroid: No thyromegaly.  ?Cardiovascular:  ?   Rate and Rhythm: Normal rate and regular rhythm.  ?Pulmonary:  ?   Effort: No respiratory distress.  ?   Breath sounds: Normal breath sounds. No wheezing.  ?Abdominal:  ?   General:

## 2022-02-26 NOTE — Patient Instructions (Signed)
Ferrous sulfate '325mg'$  - take one per day ?

## 2022-03-02 ENCOUNTER — Encounter: Payer: Self-pay | Admitting: Internal Medicine

## 2022-03-02 NOTE — Assessment & Plan Note (Signed)
On insulin.  Just decreased dose.  Follow sugars. May be able to decrease more.  Check met b and a1c  ?

## 2022-03-02 NOTE — Assessment & Plan Note (Signed)
Continue crestor.  Follow lipid panel and liver function tests.  

## 2022-03-02 NOTE — Assessment & Plan Note (Signed)
Blood pressure as outlined.  Lying blood pressure 122/60 and sitting 118/60.   Currently on amlodipine and losartan.  Follow pressures closely.  May need to adjust down medication.  Follow metabolic panel.  ?

## 2022-03-02 NOTE — Assessment & Plan Note (Signed)
Increased stress.  Overall appears to be relatively stable currently.  Follow.  Has good support.  ?

## 2022-03-02 NOTE — Assessment & Plan Note (Signed)
MRI as outlined.  Seeing neurology.  NSU - appt 03/10/22.  ?

## 2022-03-02 NOTE — Assessment & Plan Note (Signed)
MRI as outlined.  Seeing neurology.  Planning appt with NSU.  ?

## 2022-03-02 NOTE — Assessment & Plan Note (Signed)
S/p colonoscopy 07/24/21 as outlined.  Continues f/u with GI.  ?

## 2022-03-02 NOTE — Assessment & Plan Note (Signed)
hgb on recent check 9.5.  Ferritin 21.  Start ferrous sulfate.  Recheck cbc and iron studies soon.  With recent rectal bleeding and persistent GI flares, needs f/u with GI.  Apparently has appt 04/30/22.  Will see if can move up appt.  Follow closely.  No bleeding currently.   ?

## 2022-03-02 NOTE — Assessment & Plan Note (Signed)
No upper symptoms.  On omeprazole.  

## 2022-03-02 NOTE — Assessment & Plan Note (Signed)
Breathing is stable.  Will need to continue to use inhalers.  Use day of surgery and peri op period.   ?

## 2022-03-02 NOTE — Assessment & Plan Note (Signed)
Stable.  Followed by Dr Kernodle.   

## 2022-03-02 NOTE — Assessment & Plan Note (Signed)
S/p diagnostic cerebral angiogram with endovascular aneurysm embolization.  Evaluated by neurology.  MRI as outlined.  Being referred to NSU.  appt 03/10/22.    ?

## 2022-03-04 ENCOUNTER — Telehealth: Payer: PPO

## 2022-03-05 DIAGNOSIS — B351 Tinea unguium: Secondary | ICD-10-CM | POA: Diagnosis not present

## 2022-03-05 DIAGNOSIS — Q6689 Other  specified congenital deformities of feet: Secondary | ICD-10-CM | POA: Diagnosis not present

## 2022-03-05 DIAGNOSIS — M778 Other enthesopathies, not elsewhere classified: Secondary | ICD-10-CM | POA: Diagnosis not present

## 2022-03-05 DIAGNOSIS — L600 Ingrowing nail: Secondary | ICD-10-CM | POA: Diagnosis not present

## 2022-03-05 DIAGNOSIS — L909 Atrophic disorder of skin, unspecified: Secondary | ICD-10-CM | POA: Diagnosis not present

## 2022-03-10 DIAGNOSIS — R519 Headache, unspecified: Secondary | ICD-10-CM | POA: Diagnosis not present

## 2022-03-10 DIAGNOSIS — Z596 Low income: Secondary | ICD-10-CM | POA: Diagnosis not present

## 2022-03-10 DIAGNOSIS — Z86010 Personal history of colonic polyps: Secondary | ICD-10-CM | POA: Diagnosis not present

## 2022-03-10 DIAGNOSIS — Z86018 Personal history of other benign neoplasm: Secondary | ICD-10-CM | POA: Diagnosis not present

## 2022-03-10 DIAGNOSIS — M25473 Effusion, unspecified ankle: Secondary | ICD-10-CM | POA: Diagnosis not present

## 2022-03-10 DIAGNOSIS — R630 Anorexia: Secondary | ICD-10-CM | POA: Diagnosis not present

## 2022-03-10 DIAGNOSIS — Z9049 Acquired absence of other specified parts of digestive tract: Secondary | ICD-10-CM | POA: Diagnosis not present

## 2022-03-10 DIAGNOSIS — Z79899 Other long term (current) drug therapy: Secondary | ICD-10-CM | POA: Diagnosis not present

## 2022-03-10 DIAGNOSIS — D329 Benign neoplasm of meninges, unspecified: Secondary | ICD-10-CM | POA: Diagnosis not present

## 2022-03-10 DIAGNOSIS — D352 Benign neoplasm of pituitary gland: Secondary | ICD-10-CM | POA: Diagnosis not present

## 2022-03-10 DIAGNOSIS — Z5941 Food insecurity: Secondary | ICD-10-CM | POA: Diagnosis not present

## 2022-03-10 DIAGNOSIS — G4700 Insomnia, unspecified: Secondary | ICD-10-CM | POA: Diagnosis not present

## 2022-03-10 DIAGNOSIS — Z681 Body mass index (BMI) 19 or less, adult: Secondary | ICD-10-CM | POA: Diagnosis not present

## 2022-03-10 DIAGNOSIS — R634 Abnormal weight loss: Secondary | ICD-10-CM | POA: Diagnosis not present

## 2022-03-25 ENCOUNTER — Other Ambulatory Visit: Payer: Self-pay | Admitting: Internal Medicine

## 2022-04-01 ENCOUNTER — Telehealth: Payer: Self-pay | Admitting: Internal Medicine

## 2022-04-01 DIAGNOSIS — D649 Anemia, unspecified: Secondary | ICD-10-CM

## 2022-04-01 NOTE — Telephone Encounter (Signed)
Has been taking iron.  Needs an appt for f/u cbc and iron studies.  Orders placed for labs.  Needs non fasting lab appt.  Can come anytime on Friday.

## 2022-04-02 NOTE — Telephone Encounter (Signed)
Lm for pt to cb.

## 2022-04-03 NOTE — Telephone Encounter (Signed)
Pt sched for 6/5 3:15pm

## 2022-04-06 ENCOUNTER — Other Ambulatory Visit (INDEPENDENT_AMBULATORY_CARE_PROVIDER_SITE_OTHER): Payer: PPO

## 2022-04-06 DIAGNOSIS — D649 Anemia, unspecified: Secondary | ICD-10-CM

## 2022-04-07 LAB — CBC WITH DIFFERENTIAL/PLATELET
Basophils Absolute: 0.1 10*3/uL (ref 0.0–0.1)
Basophils Relative: 1 % (ref 0.0–3.0)
Eosinophils Absolute: 0.1 10*3/uL (ref 0.0–0.7)
Eosinophils Relative: 1.8 % (ref 0.0–5.0)
HCT: 27.4 % — ABNORMAL LOW (ref 36.0–46.0)
Hemoglobin: 8.7 g/dL — ABNORMAL LOW (ref 12.0–15.0)
Lymphocytes Relative: 36.5 % (ref 12.0–46.0)
Lymphs Abs: 1.9 10*3/uL (ref 0.7–4.0)
MCHC: 31.9 g/dL (ref 30.0–36.0)
MCV: 80.7 fl (ref 78.0–100.0)
Monocytes Absolute: 0.4 10*3/uL (ref 0.1–1.0)
Monocytes Relative: 8.4 % (ref 3.0–12.0)
Neutro Abs: 2.7 10*3/uL (ref 1.4–7.7)
Neutrophils Relative %: 52.3 % (ref 43.0–77.0)
Platelets: 270 10*3/uL (ref 150.0–400.0)
RBC: 3.39 Mil/uL — ABNORMAL LOW (ref 3.87–5.11)
RDW: 15.5 % (ref 11.5–15.5)
WBC: 5.1 10*3/uL (ref 4.0–10.5)

## 2022-04-07 LAB — IBC + FERRITIN
Ferritin: 8.8 ng/mL — ABNORMAL LOW (ref 10.0–291.0)
Iron: 146 ug/dL — ABNORMAL HIGH (ref 42–145)
Saturation Ratios: 40.9 % (ref 20.0–50.0)
TIBC: 357 ug/dL (ref 250.0–450.0)
Transferrin: 255 mg/dL (ref 212.0–360.0)

## 2022-04-08 ENCOUNTER — Telehealth: Payer: Self-pay

## 2022-04-08 ENCOUNTER — Telehealth: Payer: Self-pay | Admitting: *Deleted

## 2022-04-08 NOTE — Telephone Encounter (Signed)
Made appointment tomorrow at 10:45

## 2022-04-08 NOTE — Telephone Encounter (Signed)
Tried to reach patient transfer to me .

## 2022-04-08 NOTE — Telephone Encounter (Signed)
-----   Message from Lin Landsman, MD sent at 04/08/2022  3:55 PM EDT ----- Regarding: RE: update and earlier appt I will be happy to see her soon.  Caryl Pina, please reach out to her and see if she can come in next week, okay to overbook DX: Rectal bleeding, worsening anemia  Definitely agree with referral to hematology for IV iron  Thanks RV ----- Message ----- From: Einar Pheasant, MD Sent: 04/08/2022   3:58 AM EDT To: Lin Landsman, MD Subject: update and earlier appt                        Ashley Gross had follow up labs.  Her hgb has decreased more - recent check 8.7.  she is on iron.  She continues to notice rectal blood and mucus.  She has an appt with you 04/30/22.  I wanted to give you this update to see if she needed an earlier appt.  I am also going to see if she is agreeable for referral for IV iron.  Thank you for your help.  I really appreciate it.   Ashley Gross

## 2022-04-08 NOTE — Telephone Encounter (Signed)
-----   Message from Einar Pheasant, MD sent at 04/08/2022  3:52 AM EDT ----- Notify Ashley Gross that her hgb has decreased more.  Hgb 8.7.  she is on iron - per last conversation. Given persistent decrease and low iron - despite taking iron, I would like to refer her to hematology for question of need for iron infusions.  If agreeable, I will place order for referral.  Also, per previous discussion, she is continuing to have blood/mucus stool.  She sees Ashley Gross.  Has appt end of month.  I have contacted to see if can get earlier appt.  Let me know if she hears from them about earlier appt.

## 2022-04-09 ENCOUNTER — Ambulatory Visit: Payer: PPO | Admitting: Gastroenterology

## 2022-04-09 ENCOUNTER — Other Ambulatory Visit: Payer: Self-pay

## 2022-04-09 ENCOUNTER — Encounter: Payer: Self-pay | Admitting: Gastroenterology

## 2022-04-09 VITALS — BP 127/73 | HR 75 | Temp 97.7°F | Ht 64.0 in | Wt 110.5 lb

## 2022-04-09 DIAGNOSIS — K8681 Exocrine pancreatic insufficiency: Secondary | ICD-10-CM | POA: Diagnosis not present

## 2022-04-09 DIAGNOSIS — D500 Iron deficiency anemia secondary to blood loss (chronic): Secondary | ICD-10-CM

## 2022-04-09 DIAGNOSIS — K51411 Inflammatory polyps of colon with rectal bleeding: Secondary | ICD-10-CM

## 2022-04-09 NOTE — Progress Notes (Signed)
Cephas Darby, MD [address]  [place]  [place], [place] [address]  Main: [phone]  Fax: [phone]    Gastroenterology Consultation  Referring Provider:     Einar Pheasant, MD Primary Care Physician:  Einar Pheasant, MD Primary Gastroenterologist:  Dr. Cephas Darby Reason for Consultation:   Unexplained weight loss, loose stools, inflammatory polyposis of the colon        HPI:   GIANINA OLINDE is a 71 y.o. white female referred by Dr. Einar Pheasant, MD  for consultation & management of ongoing weight loss, recurrence of iron deficiency anemia.  Patient does have history of inflammatory polyposis of the colon with no evidence of underlying IBD since 2014, chronic iron deficiency anemia as well as exocrine pancreatic insufficiency.  Patient has been referred back to me due to worsening of diarrhea with increased rectal bleeding and recurrence of severe iron deficiency anemia.  Her hemoglobin has been stable around 10.5 to 5 months ago.  Decreased to 9.5 on 02/20/2022 and 8.7 on 6/5.  Serum ferritin levels were 8.8.  Therefore, Dr. Nicki Reaper has made an urgent referral.  Patient states that she has been taking Creon 36K 3 times a day with each meal.  She also reports intermittent rectal bleeding, ongoing weight loss.  She is currently off Trulicity, hemoglobin W2B is normal.  Patient is currently taking oral iron supplements only.  With regards to rectal bleeding and inflammatory polyposis, patient for colonoscopy in September 2022 and large polyps were removed which helped in improvement of anemia.  However, rectal bleeding has recurred lately.  During that time, I discussed with patient regarding the role of Entyvio which has been reported in case series for management of inflammatory polyposis.  However, patient did not receive Entyvio infusion.  GI Procedures:  Colonoscopy 07/24/2021 - The examined portion of the ileum was normal. - Three 5 to 7 mm polyps in the  transverse colon, in the ascending colon and in the cecum, removed with a cold snare. Resected and retrieved. - Normal mucosa in the entire examined colon. Biopsied. - Polyposis at the recto-sigmoid colon, in the sigmoid colon and in the ascending colon. DIAGNOSIS:  A.  COLON POLYP, CECUM; COLD SNARE:  - INFLAMMATORY POLYP.  - NEGATIVE FOR DYSPLASIA AND MALIGNANCY.   B.  COLON POLYP, ASCENDING; COLD SNARE:  - INFLAMMATORY POLYP.  - NEGATIVE FOR DYSPLASIA AND MALIGNANCY.   C.  COLON; RANDOM COLD BIOPSY:  - COLONIC MUCOSA WITH INTACT CRYPT ARCHITECTURE.  - NEGATIVE FOR MICROSCOPIC COLITIS, DYSPLASIA, AND MALIGNANCY.   D.  COLON POLYP, TRANSVERSE; COLD SNARE:  - FECAL FRAGMENTS; NO TISSUE IS IDENTIFIED.   Colonoscopy 01/04/2019 - Hemorrhoids found on perianal exam. - The examined portion of the ileum was normal. - Multiple 10 to 30 mm, recently bleeding polyps in the sigmoid colon and in the descending colon, removed with a hot snare. Polyp resection was incomplete, and the resected tissue was partially retrieved.     Colonoscopy 04/06/2007 Indications:  last colonoscopy 2001, anemia. Impression: -Congested mucosa in the entire colon. -Nodules in the proximal ascending colon, biopsied. -Nodule in the distal ascending colon, biopsied. -Congested erythematous and friable (with contact bleeding) mucosa in the  ascending colon and in the cecum. . "CECUM" (ENDOSCOPIC BIOPSY)     COLONIC MUCOSA WITH NO PATHOLOGIC DIAGNOSIS.     NO ACTIVE OR CHRONIC COLITIS IS SEEN.     NO EVIDENCE OF LYMPHOCYTIC OR COLLAGENOUS COLITIS IS SEEN. B. "RIGHT  COLON NODULES" (ENDOSCOPIC BIOPSY):     INFLAMMATORY POLYPS. C. "RECTOSIGMOID COLON" (ENDOSCOPIC BIOPSY):     COLONIC MUCOSA WITH NO PATHOLOGIC DIAGNOSIS.     NO ACTIVE OR CHRONIC COLITIS IS SEEN.     NO EVIDENCE OF LYMPHOCYTIC OR COLLAGENOUS COLITIS IS SEEN. D. "DISTAL ASCENDING COLON" (ENDOSCOPIC BIOPSY):     INFLAMMATORY  POLYPS. ------------------------------------------------------------------------------------------------- Colonoscopy 2014  A. "ILEUM" (ENDOSCOPIC BIOPSY):     SMALL INTESTINAL MUCOSA WITH NO PATHOLOGIC DIAGNOSIS.     NO ACTIVE OR CHRONIC ENTERITIS IS SEEN.     NO GRANULOMATOUS INFLAMMATION IS SEEN. B. "COLON, ILEOCECAL VALVE NODULE" (BIOPSY):     COLONIC MUCOSA, WITH REACTIVE CHANGES.     NO ADENOMATOUS MUCOSA IS IDENTIFIED. C. "RIGHT COLON" (ENDOSCOPIC BIOPSY):     COLONIC MUCOSA WITH NO PATHOLOGIC DIAGNOSIS.     NO ACTIVE OR CHRONIC COLITIS IS SEEN.     NO EVIDENCE OF LYMPHOCYTIC OR COLLAGENOUS COLITIS IS SEEN. D. "HEPATIC FLEXURE MASS" (ENDOSCOPIC BIOPSY):     INFLAMMATORY POLYP.     NO ADENOMATOUS MUCOSA IS SEEN. E. "TRANSVERSE COLON" (ENDOSCOPIC BIOPSY):     COLONIC MUCOSA WITH NO PATHOLOGIC DIAGNOSIS.     NO ACTIVE OR CHRONIC COLITIS IS SEEN.     NO EVIDENCE OF LYMPHOCYTIC OR COLLAGENOUS COLITIS IS SEEN. F. "LEFT COLON" (ENDOSCOPIC BIOPSY):     COLONIC MUCOSA WITH NO PATHOLOGIC DIAGNOSIS.     NO ACTIVE OR CHRONIC COLITIS IS SEEN.     NO EVIDENCE OF LYMPHOCYTIC OR COLLAGENOUS COLITIS IS SEEN.     A MINUTE HYPERPLASTIC POLYP IS ALSO PRESENT. G. "LEFT COLON POLYP(S)" (ENDOSCOPIC POLYPECTOMY):     TUBULAR ADENOMA.     NO HIGH GRADE DYSPLASIA OR CARCINOMA IS SEEN.  Colonoscopy 12/27/2017 - The examined portion of the ileum was normal. Biopsied. - One 12 mm polyp in the ascending colon, removed with a hot snare. Resected and retrieved. - One 40 mm polyp in the transverse colon, removed with a hot snare. Incomplete resection. Resected tissue retrieved. - A few 8 to 15 mm, non-bleeding polyps in the sigmoid colon. Resection not attempted. - The distal rectum and anal verge are normal on retroflexion view.   Upper endoscopy 12/27/2017 - Normal duodenal bulb and second portion of the duodenum. Biopsied. - Erythematous mucosa in the gastric body and antrum. - Normal cardia,  gastric fundus and incisura. Biopsied. - Normal gastroesophageal junction and esophagus.  DIAGNOSIS:  A. DUODENUM; COLD BIOPSY:  - DUODENAL MUCOSA WITH PRESERVED VILLOUS ARCHITECTURE AND BRUNNER'S  GLAND HYPERPLASIA.  - NEGATIVE FOR INTRA-EPITHELIAL LYMPHOCYTOSIS, DYSPLASIA AND MALIGNANCY.   B.  STOMACH; COLD BIOPSY:  - ANTRAL AND OXYNTIC MUCOSA WITH MILD CHRONIC GASTRITIS.  - NEGATIVE FOR H. PYLORI, DYSPLASIA AND MALIGNANCY.   C.  TERMINAL ILEUM; COLD BIOPSY:  - SMALL BOWEL MUCOSA WITH INTACT VILLOUS ARCHITECTURE.  - NEGATIVE FOR INTRAEPITHELIAL LYMPHOCYTOSIS, DYSPLASIA AND MALIGNANCY.   D.  RANDOM RIGHT COLON; COLD BIOPSY:  - COLONIC MUCOSA NEGATIVE FOR MICROSCOPIC COLITIS, DYSPLASIA AND  MALIGNANCY.    E.  COLON POLYP, ASCENDING; HOT SNARE:  - INFLAMMATORY-TYPE POLYP.   F.  COLON POLYP, TRANSVERSE; HOT SNARE:  - INFLAMMATORY-TYPE POLYP.   G.  RANDOM TRANSVERSE COLON; COLD BIOPSY:  - COLONIC MUCOSA NEGATIVE FOR MICROSCOPIC COLITIS, DYSPLASIA AND  MALIGNANCY.   Past Medical History:  Diagnosis Date   Abnormal liver function test 10/06/2012   Anemia, unspecified    Asthma    B12 deficiency 02/11/2019   Bleeding internal hemorrhoids 09/13/2017  Choledocholithiasis 06/29/2012   Formatting of this note might be different from the original. Dilated CBD on abdominal imaging 04/2012   Chronic diarrhea 02/16/2013   Colitis    Complication of anesthesia    asthma attack after shoulder surgery   Diabetes mellitus ([place])    type II   Esophagitis    GERD (gastroesophageal reflux disease)    Heart murmur    for years- nothing to be concerned about.   History of kidney stones    Hx of arteriovenous malformation (AVM)    Hypertension    IBS (irritable bowel syndrome)    Pernicious anemia    Personal history of colonic polyps 02/10/2013   02/08/13 colonoscopy - question of rectal varices, two 3-62m polyps in the sigmoid colon, mass at the hepatic flexure, one 520mpolyp at the  hepatic flexure, nodule at the ileocecal valve, congested mucusa in the entire colon, flattened villi mucosa in the terminal ileum.     Pneumonia    [place]atic [place] ([place]   s/p penicillamine, plaquenil, MTX, sulfasalazine.  s/p Embrel, Humira,.  Iritis.     Pure hypercholesterolemia    Rectal bleeding 07/15/2017    Past Surgical History:  Procedure Laterality Date   ABDOMINAL HYSTERECTOMY  1981   ovaries left in place   ANKLE SURGERY     right   BUNIONECTOMY     CHOLECYSTECTOMY  1989   COLONOSCOPY WITH PROPOFOL N/A 12/27/2017   Procedure: COLONOSCOPY WITH PROPOFOL;  Surgeon: VaLin LandsmanMD;  [facility];  Service: Gastroenterology;  Laterality: N/A;   COLONOSCOPY WITH PROPOFOL N/A 01/04/2019   Procedure: COLONOSCOPY WITH PROPOFOL;  Surgeon: VaLin LandsmanMD;  [place];  Laterality: N/A;  Diabetic - oral and injectable   COLONOSCOPY WITH PROPOFOL N/A 07/24/2021   Procedure: COLONOSCOPY WITH PROPOFOL;  Surgeon: VaLin LandsmanMD;  [place] [facility] Service: Endoscopy;  Laterality: N/A;  Diabetic   ESOPHAGOGASTRODUODENOSCOPY (EGD) WITH PROPOFOL N/A 12/27/2017   Procedure: ESOPHAGOGASTRODUODENOSCOPY (EGD) WITH PROPOFOL;  Surgeon: VaLin LandsmanMD;  [facility];  Service: Gastroenterology;  Laterality: N/A;   HAND SURGERY     right   HEEL SPUR SURGERY     IR 3D INDEPENDENT WKST  10/01/2021   IR ANGIO INTRA EXTRACRAN SEL COM CAROTID INNOMINATE UNI L MOD SED  10/01/2021   IR ANGIO INTRA EXTRACRAN SEL INTERNAL CAROTID UNI R MOD SED  10/01/2021   IR ANGIO VERTEBRAL SEL VERTEBRAL BILAT MOD SED  10/01/2021   IR CT HEAD LTD  10/01/2021   IR TRANSCATH/EMBOLIZ  10/01/2021   IR [place] VASC ACCESS RIGHT  10/01/2021   NOSE SURGERY     x2   POLYPECTOMY  01/04/2019   Procedure: POLYPECTOMY;  Surgeon: VaLin LandsmanMD;  [place];;    POLYPECTOMY N/A 07/24/2021   Procedure: POLYPECTOMY;  Surgeon: VaLin LandsmanMD;  [place]: [place]oscopy;  Laterality: N/A;   RADIOLOGY WITH ANESTHESIA N/A 10/01/2021   Procedure: EMStarr SinclairITH ANESTHESIA;  Surgeon: dePedro EarlsMD;  [place] [facility]: Radiology;  Laterality: N/A;   SHOULDER ARTHROSCOPY WITH OPEN ROTATOR CUFF REPAIR Left 09/20/2018   Procedure: SHOULDER ARTHROSCOPY WITH OPEN ROTATOR CUFF REPAIR;  Surgeon: PoCorky MullMD;  [facility];  Service: Orthopedics;  Laterality: Left;   SHOULDER CLOSED REDUCTION Left 12/21/2018   Procedure: MANIPULATION UNDER ANESTHESIA WITH  STEROID INJECTION;  Surgeon: Corky Mull, MD;  Location: [facility];  Service: Orthopedics;  Laterality: Left;  Diabetic - insulin and oral meds   UMBILICAL HERNIA REPAIR     XI ROBOTIC ASSISTED INGUINAL HERNIA REPAIR WITH MESH Left 12/16/2021   Procedure: XI ROBOTIC ASSISTED INGUINAL HERNIA REPAIR WITH MESH;  Surgeon: Olean Ree, MD;  Location: [facility];  Service: General;  Laterality: Left;     Current Outpatient Medications:    acetaminophen (TYLENOL) 325 MG tablet, Take 650 mg by mouth every 6 (six) hours as needed for moderate pain., Disp: , Rfl:    acetaminophen (TYLENOL) 500 MG tablet, Take 2 tablets (1,000 mg total) by mouth every 6 (six) hours as needed for mild pain., Disp: , Rfl:    albuterol (PROVENTIL) (2.5 MG/3ML) 0.083% nebulizer solution, Take 3 mLs (2.5 mg total) by nebulization every 4 (four) hours as needed for wheezing or shortness of breath., Disp: 360 mL, Rfl: 12   albuterol (VENTOLIN HFA) 108 (90 Base) MCG/ACT inhaler, Inhale 2 puffs into the lungs every 6 (six) hours as needed for wheezing or shortness of breath., Disp: , Rfl:    amLODipine (NORVASC) 5 MG tablet, TAKE 1 TABLET(5 MG) BY MOUTH TWICE DAILY, Disp: 180 tablet, Rfl: 1   budesonide-formoterol (SYMBICORT) 160-4.5 MCG/ACT inhaler, Inhale 2 puffs into  the lungs 2 (two) times daily. (Patient taking differently: Inhale 2 puffs into the lungs 2 (two) times daily as needed (shortness of breath or wheezing).), Disp: 3 each, Rfl: 3   Cholecalciferol (VITAMIN D-3 PO), Take 2,000 Units by mouth daily., Disp: , Rfl:    CREON 36000-114000 units CPEP capsule, Take 36,000 Units by mouth 3 (three) times daily with meals. Also 1 capsule with evening snack, Disp: , Rfl:    cyclobenzaprine (FLEXERIL) 10 MG tablet, TAKE 1 TABLET BY MOUTH EVERY NIGHT AS NEEDED, Disp: 30 tablet, Rfl: 0   diphenhydramine-acetaminophen (TYLENOL PM) 25-500 MG TABS tablet, Take 2 tablets by mouth at bedtime., Disp: , Rfl:    gabapentin (NEURONTIN) 100 MG capsule, Take 100 mg by mouth daily., Disp: , Rfl:    insulin degludec (TRESIBA FLEXTOUCH) 100 UNIT/ML FlexTouch Pen, Inject 14 Units into the skin daily. (Patient taking differently: Inject 10 Units into the skin daily with supper.), Disp: 12 mL, Rfl: 2   losartan (COZAAR) 100 MG tablet, Take 1 tablet (100 mg total) by mouth at bedtime., Disp: 90 tablet, Rfl: 1   Melatonin 10 MG TABS, Take 10 mg by mouth at bedtime., Disp: , Rfl:    omeprazole (PRILOSEC) 20 MG capsule, TAKE 1 CAPSULE(20 MG) BY MOUTH TWICE DAILY, Disp: 180 capsule, Rfl: 3   ONE TOUCH ULTRA TEST test strip, PATIENT NEEDS NEW METER STRIPS AND LANCETS FOR ONE TOUCH. HER INSURANCE NO LONGER COVERS ACCUCHEK., Disp: 100 each, Rfl: PRN   rosuvastatin (CRESTOR) 10 MG tablet, TAKE 1 TABLET(10 MG) BY MOUTH DAILY, Disp: 90 tablet, Rfl: 3   Family History  Problem Relation Age of Onset   Diabetes Other    Hypertension Other    Breast cancer Neg Hx    Colon cancer Neg Hx      Social History   Tobacco Use   Smoking status: Never   Smokeless tobacco: Never  Vaping Use   Vaping Use: Never used  Substance Use Topics   Alcohol use: Never   Drug use: Never    Allergies as of 04/09/2022 - Review Complete 04/09/2022  Allergen Reaction Noted   Aspirin Other (  See Comments)  10/06/2012   Beta adrenergic blockers  10/06/2012   Lisinopril Cough 12/21/2018   Mtx support [cobalamine combinations] Other (See Comments) 10/06/2012   Vasotec [enalapril] Cough 10/06/2012   Verapamil Other (See Comments) 10/06/2012   Voltaren [diclofenac sodium]  10/06/2012   Erythromycin Other (See Comments) 10/06/2012   Indocin [indomethacin]  10/06/2012   Jardiance [empagliflozin] Other (See Comments) 02/26/2020    Review of Systems:    All systems reviewed and negative except where noted in HPI.   Physical Exam:  BP 127/73 (BP Location: Left Arm, Patient Position: Sitting, Cuff Size: Normal)   Pulse 75   Temp 97.7 F (36.5 C) (Oral)   Ht '5\' 4"'$  (1.626 m)   Wt 110 lb 8 oz (50.1 kg)   BMI 18.97 kg/m  No LMP recorded. Patient has had a hysterectomy.  General:   Alert, thin built, moderately nourished, pleasant and cooperative in NAD Head:  Normocephalic and atraumatic. Eyes:  Sclera clear, no icterus.   Conjunctiva pink. Ears:  Normal auditory acuity. Nose:  No deformity, discharge, or lesions. Mouth:  No deformity or lesions,oropharynx pink & moist. Neck:  Supple; no masses or thyromegaly. Lungs:  Respirations even and unlabored.  Clear throughout to auscultation.   No wheezes, crackles, or rhonchi. No acute distress. Heart:  Regular rate and rhythm; no murmurs, clicks, rubs, or gallops. Abdomen:  Normal bowel sounds.  Obese, Soft, non-tender and localized swelling in the left lower quadrant/left inguinal area, likely inguinal hernia, hepatosplenomegaly.  No guarding or rebound tenderness.   Rectal: Not done today Msk:  Symmetrical without gross deformities. Good, equal movement & strength bilaterally. Pulses:  Normal pulses noted. Extremities:  No clubbing or edema.  No cyanosis. Neurologic:  Alert and oriented x3;  grossly normal neurologically. Skin:  Intact without significant lesions or rashes. No jaundice. Psych:  Alert and cooperative. Normal mood and  affect.  Imaging Studies: Reviewed  Assessment and Plan:   PANTERA WINTERROWD is a 71 y.o. white female with h/o psoriatic arthritis, chronic diarrhea, inflammatory polyps in colon presents for f/u of rectal bleeding and chronic diarrhea. She underwent ligation of RA, RP. LL internal hemorrhoids. Had extensive workup in the past which is negative for gastrinoma, IBD, microscopic colitis, celiac disease, chronic infections, EPI.  Rectal bleeding had resolved after resection of large inflammatory polyps.  Patient was no longer iron deficient.  Patient now returns with recurrence of rectal bleeding as well as severe iron deficiency anemia  Inflammatory polyposis of the colon are inflammatory Polyposis is a rare phenomenon which can lead to rectal bleeding and chronic diarrhea.  This may or may not be associated with inflammatory bowel disease.  Her colon biopsies were negative for inflammatory bowel disease.  Few case reports have been mentioned in the literature that in addition to removal of the large polyps, there might be a role for immunosuppressive therapy such as Biologics.  Today, I have discussed regarding trial of vedolizumab due to recurrence of rectal bleeding and iron deficiency anemia.  It was approved in the past.  Patient is agreeable to try, will apply for Entyvio today  Unexplained weight loss and severe exocrine pancreatic insufficiency Fecal elastase level 64 in 07/2021 Continue Zenpep 36K with placement of each meal and 1 with snack, based on her weight CT abdomen and pelvis with contrast revealed diffuse edema of the large bowel secondary to inflammatory polyposis Encouraged high-protein diet, protein supplements and high calorie diet encouraged her to maintain weight log.  Iron deficiency anemia Recommend urgent referral to hematology for parenteral iron therapy Start fusion plus, samples provided  Left groin swelling Ultrasound revealed fat-containing hernia  Follow up in 3  months   Cephas Darby, MD

## 2022-04-13 ENCOUNTER — Telehealth: Payer: Self-pay | Admitting: Gastroenterology

## 2022-04-13 NOTE — Telephone Encounter (Addendum)
Pt left message stating that the new medication that was prescribed last visit  is causing stomach pain,vomiting , I looked on med list cannot find medication that she is referring to.

## 2022-04-15 NOTE — Telephone Encounter (Signed)
Returned pt's call regarding the symptoms she was having. Pt confirmed the medication was Fusion Plus. Advised pt to stop this medication. She stated her Hematology appt is this Friday,

## 2022-04-16 ENCOUNTER — Ambulatory Visit (INDEPENDENT_AMBULATORY_CARE_PROVIDER_SITE_OTHER): Payer: PPO | Admitting: Internal Medicine

## 2022-04-16 ENCOUNTER — Encounter: Payer: Self-pay | Admitting: Internal Medicine

## 2022-04-16 VITALS — BP 118/68 | HR 60 | Temp 98.3°F | Resp 15 | Ht 64.0 in | Wt 111.6 lb

## 2022-04-16 DIAGNOSIS — D649 Anemia, unspecified: Secondary | ICD-10-CM

## 2022-04-16 DIAGNOSIS — F320 Major depressive disorder, single episode, mild: Secondary | ICD-10-CM

## 2022-04-16 DIAGNOSIS — E118 Type 2 diabetes mellitus with unspecified complications: Secondary | ICD-10-CM

## 2022-04-16 DIAGNOSIS — Z1231 Encounter for screening mammogram for malignant neoplasm of breast: Secondary | ICD-10-CM

## 2022-04-16 DIAGNOSIS — L4050 Arthropathic psoriasis, unspecified: Secondary | ICD-10-CM

## 2022-04-16 DIAGNOSIS — K51419 Inflammatory polyps of colon with unspecified complications: Secondary | ICD-10-CM | POA: Diagnosis not present

## 2022-04-16 DIAGNOSIS — E237 Disorder of pituitary gland, unspecified: Secondary | ICD-10-CM

## 2022-04-16 DIAGNOSIS — I671 Cerebral aneurysm, nonruptured: Secondary | ICD-10-CM

## 2022-04-16 DIAGNOSIS — K219 Gastro-esophageal reflux disease without esophagitis: Secondary | ICD-10-CM | POA: Diagnosis not present

## 2022-04-16 DIAGNOSIS — I10 Essential (primary) hypertension: Secondary | ICD-10-CM | POA: Diagnosis not present

## 2022-04-16 DIAGNOSIS — D329 Benign neoplasm of meninges, unspecified: Secondary | ICD-10-CM | POA: Diagnosis not present

## 2022-04-16 DIAGNOSIS — Z794 Long term (current) use of insulin: Secondary | ICD-10-CM

## 2022-04-16 DIAGNOSIS — J4520 Mild intermittent asthma, uncomplicated: Secondary | ICD-10-CM

## 2022-04-16 DIAGNOSIS — F439 Reaction to severe stress, unspecified: Secondary | ICD-10-CM

## 2022-04-16 DIAGNOSIS — E7800 Pure hypercholesterolemia, unspecified: Secondary | ICD-10-CM

## 2022-04-16 LAB — MICROALBUMIN / CREATININE URINE RATIO
Creatinine,U: 87.2 mg/dL
Microalb Creat Ratio: 1.7 mg/g (ref 0.0–30.0)
Microalb, Ur: 1.4 mg/dL (ref 0.0–1.9)

## 2022-04-16 NOTE — Progress Notes (Unsigned)
Patient ID: Ashley Gross, female   DOB: 1951-02-25, 71 y.o.   MRN: [id number]   Subjective:    Patient ID: Ashley Gross, female    DOB: 1951-04-20, 71 y.o.   MRN: [id number]  This visit occurred during the SARS-CoV-2 public health emergency.  Safety protocols were in place, including screening questions prior to the visit, additional usage of staff PPE, and extensive cleaning of exam room while observing appropriate contact time as indicated for disinfecting solutions.   Patient here for  Chief Complaint  Patient presents with   Anemia   Hypertension   .   HPI    Past Medical History:  Diagnosis Date   Abnormal liver function test 10/06/2012   Anemia, unspecified    Asthma    B12 deficiency 02/11/2019   Bleeding internal hemorrhoids 09/13/2017   Choledocholithiasis 06/29/2012   Formatting of this note might be different from the original. Dilated CBD on abdominal imaging 04/2012   Chronic diarrhea 02/16/2013   Colitis    Complication of anesthesia    asthma attack after shoulder surgery   Diabetes mellitus ([place])    type II   Esophagitis    GERD (gastroesophageal reflux disease)    Heart murmur    for years- nothing to be concerned about.   History of kidney stones    Hx of arteriovenous malformation (AVM)    Hypertension    IBS (irritable bowel syndrome)    Pernicious anemia    Personal history of colonic polyps 02/10/2013   02/08/13 colonoscopy - question of rectal varices, two 3-32m polyps in the sigmoid colon, mass at the hepatic flexure, one 550mpolyp at the hepatic flexure, nodule at the ileocecal valve, congested mucusa in the entire colon, flattened villi mucosa in the terminal ileum.     Pneumonia    Psoriatic arthritis ([place]   s/p penicillamine, plaquenil, MTX, sulfasalazine.  s/p Embrel, Humira,.  Iritis.     Pure hypercholesterolemia    Rectal bleeding 07/15/2017   Past Surgical History:  Procedure Laterality Date   ABDOMINAL HYSTERECTOMY  1981    ovaries left in place   ANKLE SURGERY     right   BUNIONECTOMY     CHOLECYSTECTOMY  1989   COLONOSCOPY WITH PROPOFOL N/A 12/27/2017   Procedure: COLONOSCOPY WITH PROPOFOL;  Surgeon: VaLin LandsmanMD;  [facility]  Service: Gastroenterology;  Laterality: N/A;   COLONOSCOPY WITH PROPOFOL N/A 01/04/2019   Procedure: COLONOSCOPY WITH PROPOFOL;  Surgeon: VaLin LandsmanMD;  [place] Service: [place]oscopy;  Laterality: N/A;  Diabetic - oral and injectable   COLONOSCOPY WITH PROPOFOL N/A 07/24/2021   Procedure: COLONOSCOPY WITH PROPOFOL;  Surgeon: VaLin LandsmanMD;  [place] [facility] Service: Endoscopy;  Laterality: N/A;  Diabetic   ESOPHAGOGASTRODUODENOSCOPY (EGD) WITH PROPOFOL N/A 12/27/2017   Procedure: ESOPHAGOGASTRODUODENOSCOPY (EGD) WITH PROPOFOL;  Surgeon: VaLin LandsmanMD;  [facility];  Service: Gastroenterology;  Laterality: N/A;   HAND SURGERY     right   HEEL SPUR SURGERY     IR 3D INDEPENDENT WKST  10/01/2021   IR ANGIO INTRA EXTRACRAN SEL COM CAROTID INNOMINATE UNI L MOD SED  10/01/2021   IR ANGIO INTRA EXTRACRAN SEL INTERNAL CAROTID UNI R MOD SED  10/01/2021   IR ANGIO VERTEBRAL SEL VERTEBRAL BILAT MOD SED  10/01/2021   IR CT HEAD LTD  10/01/2021   IR TRANSCATH/EMBOLIZ  10/01/2021   IR [place] VASC  ACCESS RIGHT  10/01/2021   NOSE SURGERY     x2   POLYPECTOMY  01/04/2019   Procedure: POLYPECTOMY;  Surgeon: Lin Landsman, MD;  Location: [place];  Service: Endoscopy;;   POLYPECTOMY N/A 07/24/2021   Procedure: POLYPECTOMY;  Surgeon: Lin Landsman, MD;  Location: [place];  Service: Endoscopy;  Laterality: N/A;   RADIOLOGY WITH ANESTHESIA N/A 10/01/2021   Procedure: Starr Sinclair WITH ANESTHESIA;  Surgeon: Pedro Earls, MD;  Location: [place];  Service: Radiology;  Laterality: N/A;   SHOULDER ARTHROSCOPY WITH OPEN ROTATOR CUFF REPAIR Left 09/20/2018    Procedure: SHOULDER ARTHROSCOPY WITH OPEN ROTATOR CUFF REPAIR;  Surgeon: Corky Mull, MD;  Location: [facility];  Service: Orthopedics;  Laterality: Left;   SHOULDER CLOSED REDUCTION Left 12/21/2018   Procedure: MANIPULATION UNDER ANESTHESIA WITH STEROID INJECTION;  Surgeon: Corky Mull, MD;  Location: [place];  Service: Orthopedics;  Laterality: Left;  Diabetic - insulin and oral meds   UMBILICAL HERNIA REPAIR     XI ROBOTIC ASSISTED INGUINAL HERNIA REPAIR WITH MESH Left 12/16/2021   Procedure: XI ROBOTIC ASSISTED INGUINAL HERNIA REPAIR WITH MESH;  Surgeon: Olean Ree, MD;  Location: [facility];  Service: General;  Laterality: Left;   Family History  Problem Relation Age of Onset   Diabetes Other    Hypertension Other    Breast cancer Neg Hx    Colon cancer Neg Hx    Social History   Socioeconomic History   Marital status: Widowed    Spouse name: Not on file   Number of children: 2   Years of education: Not on file   Highest education level: Not on file  Occupational History   Not on file  Tobacco Use   Smoking status: Never   Smokeless tobacco: Never  Vaping Use   Vaping Use: Never used  Substance and Sexual Activity   Alcohol use: Never   Drug use: Never   Sexual activity: Not Currently  Other Topics Concern   Not on file  Social History Narrative   She is married and has two children   Dennis daughter lives with her.   Social Determinants of Health   Financial Resource Strain: Medium Risk (11/05/2021)   Overall Financial Resource Strain (CARDIA)    Difficulty of Paying Living Expenses: Somewhat hard  Food Insecurity: No Food Insecurity (01/07/2021)   Hunger Vital Sign    Worried About Running Out of Food in the Last Year: Never true    Ran Out of Food in the Last Year: Never true  Transportation Needs: No Transportation Needs (01/07/2021)   PRAPARE - Hydrologist (Medical): No    Lack of Transportation (Non-Medical): No   Physical Activity: Not on file  Stress: No Stress Concern Present (02/24/2021)   [place]    Feeling of Stress : Only a little  Recent Concern: Stress - Stress Concern Present (12/26/2020)   [place]    Feeling of Stress : To some extent  Social Connections: Unknown (01/07/2021)   Social Connection and Isolation Panel [NHANES]    Frequency of Communication with Friends and Family: More than three times a week    Frequency of Social Gatherings with Friends and Family: Not on file    Attends Religious Services: Not on file    Active Member of Clubs or Organizations: Not on file  Attends Music therapist: Not on file    Marital Status: Not on file     Review of Systems     Objective:     BP 118/68 (BP Location: Left Arm, Patient Position: Sitting, Cuff Size: Small)   Pulse 60   Temp 98.3 F (36.8 C) (Temporal)   Resp 15   Ht '5\' 4"'$  (1.626 m)   Wt 111 lb 9.6 oz (50.6 kg)   SpO2 97%   BMI 19.16 kg/m  Wt Readings from Last 3 Encounters:  04/16/22 111 lb 9.6 oz (50.6 kg)  04/09/22 110 lb 8 oz (50.1 kg)  02/26/22 113 lb (51.3 kg)    Physical Exam   Outpatient Encounter Medications as of 04/16/2022  Medication Sig   acetaminophen (TYLENOL) 325 MG tablet Take 650 mg by mouth every 6 (six) hours as needed for moderate pain.   acetaminophen (TYLENOL) 500 MG tablet Take 2 tablets (1,000 mg total) by mouth every 6 (six) hours as needed for mild pain.   albuterol (PROVENTIL) (2.5 MG/3ML) 0.083% nebulizer solution Take 3 mLs (2.5 mg total) by nebulization every 4 (four) hours as needed for wheezing or shortness of breath.   albuterol (VENTOLIN HFA) 108 (90 Base) MCG/ACT inhaler Inhale 2 puffs into the lungs every 6 (six) hours as needed for wheezing or shortness of breath.   amLODipine (NORVASC) 5 MG tablet TAKE 1 TABLET(5 MG) BY MOUTH  TWICE DAILY   budesonide-formoterol (SYMBICORT) 160-4.5 MCG/ACT inhaler Inhale 2 puffs into the lungs 2 (two) times daily. (Patient taking differently: Inhale 2 puffs into the lungs 2 (two) times daily as needed (shortness of breath or wheezing).)   Cholecalciferol (VITAMIN D-3 PO) Take 2,000 Units by mouth daily.   CREON 36000-114000 units CPEP capsule Take 36,000 Units by mouth 3 (three) times daily with meals. Also 1 capsule with evening snack   cyclobenzaprine (FLEXERIL) 10 MG tablet TAKE 1 TABLET BY MOUTH EVERY NIGHT AS NEEDED   diphenhydramine-acetaminophen (TYLENOL PM) 25-500 MG TABS tablet Take 2 tablets by mouth at bedtime.   gabapentin (NEURONTIN) 100 MG capsule Take 100 mg by mouth daily.   insulin degludec (TRESIBA FLEXTOUCH) 100 UNIT/ML FlexTouch Pen Inject 14 Units into the skin daily. (Patient taking differently: Inject 10 Units into the skin daily with supper.)   losartan (COZAAR) 100 MG tablet Take 1 tablet (100 mg total) by mouth at bedtime.   Melatonin 10 MG TABS Take 10 mg by mouth at bedtime.   omeprazole (PRILOSEC) 20 MG capsule TAKE 1 CAPSULE(20 MG) BY MOUTH TWICE DAILY   ONE TOUCH ULTRA TEST test strip PATIENT NEEDS NEW METER STRIPS AND LANCETS FOR ONE TOUCH. HER INSURANCE NO LONGER COVERS ACCUCHEK.   rosuvastatin (CRESTOR) 10 MG tablet TAKE 1 TABLET(10 MG) BY MOUTH DAILY   No facility-administered encounter medications on file as of 04/16/2022.     Lab Results  Component Value Date   WBC 5.1 04/06/2022   HGB 8.7 Repeated and verified X2. (L) 04/06/2022   HCT 27.4 (L) 04/06/2022   PLT 270.0 04/06/2022   GLUCOSE 54 (L) 02/20/2022   CHOL 102 02/20/2022   TRIG 65.0 02/20/2022   HDL 42.30 02/20/2022   LDLDIRECT 74.0 04/19/2019   LDLCALC 47 02/20/2022   ALT 11 02/20/2022   AST 18 02/20/2022   NA 143 02/20/2022   K 3.8 02/20/2022   CL 108 02/20/2022   CREATININE 0.81 02/20/2022   BUN 11 02/20/2022   CO2 29 02/20/2022   TSH 3.11  10/17/2021   INR 1.0 10/01/2021    HGBA1C 5.6 02/20/2022   MICROALBUR 2.0 (H) 11/13/2020    MR BRAIN W WO CONTRAST  Result Date: 01/25/2022 CLINICAL DATA:  Meningioma follow-up. Pituitary gland follow-up. Treated aneurysm. EXAM: MRI HEAD WITHOUT AND WITH CONTRAST TECHNIQUE: Multiplanar, multiecho pulse sequences of the brain and surrounding structures were obtained without and with intravenous contrast. CONTRAST:  67m GADAVIST GADOBUTROL 1 MMOL/ML IV SOLN COMPARISON:  MR head 08/15/2021 FINDINGS: Brain: Acute infarct or hemorrhage is present. No significant white matter lesions are present. The ventricles are of normal size. No significant extraaxial fluid collection is present. A 4 x 9 mm meningioma lateral to the right frontal lobe is stable. Dedicated imaging of the 2 Terri gland shows asymmetric enlargement of the right side of the gland. The area of slightly delayed enhancement measures up to 8 mm. This is not significantly changed from prior exams. No other pathologic enhancement is present. 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 Enhancement pattern is changed significantly. No other focal lesions are present. Flow is present in the major intracranial arteries. Skull and upper cervical spine: The craniocervical junction is normal. Upper cervical spine is within normal limits. Marrow signal is unremarkable. Sinuses/Orbits: Bilateral maxillary antrostomies and partial ethmoidectomies noted. No significant residual sinus disease is present. Bilateral lens replacements are noted. Globes and orbits are otherwise unremarkable. IMPRESSION: 1. Stable appearance of 8 mm area of delayed enhancement in the right side of the gland compatible with a pituitary microadenoma. Correlate within the endocrine laboratory findings. 6-12 month follow-up MRI of the pituitary recommended to assure stability. 2. Stable 4 x 9 mm meningioma lateral to the right frontal lobe. 3. No acute intracranial abnormality or  significant interval change. 4. Exclusion of the aneurysm is incompletely evaluated by MR head without and with contrast. The enhancement pattern is markedly diminished, suggesting the aneurysm is excluded. MRA or CTA would be useful for further evaluation. Electronically Signed   By: CSan MorelleM.D.   On: 01/25/2022 13:09       Assessment & Plan:   Problem List Items Addressed This Visit     Anemia   Controlled type 2 diabetes mellitus with complication, with long-term current use of insulin ([place]   Other Visit Diagnoses     Encounter for screening mammogram for malignant neoplasm of breast    -  Primary        CEinar Pheasant MD

## 2022-04-17 ENCOUNTER — Inpatient Hospital Stay: Payer: PPO | Attending: Oncology | Admitting: Oncology

## 2022-04-17 ENCOUNTER — Encounter: Payer: Self-pay | Admitting: Oncology

## 2022-04-17 ENCOUNTER — Inpatient Hospital Stay: Payer: PPO

## 2022-04-17 VITALS — BP 137/69 | HR 71 | Resp 14 | Ht 64.0 in | Wt 111.8 lb

## 2022-04-17 DIAGNOSIS — E611 Iron deficiency: Secondary | ICD-10-CM

## 2022-04-17 DIAGNOSIS — D508 Other iron deficiency anemias: Secondary | ICD-10-CM

## 2022-04-17 DIAGNOSIS — D509 Iron deficiency anemia, unspecified: Secondary | ICD-10-CM | POA: Insufficient documentation

## 2022-04-17 DIAGNOSIS — K51419 Inflammatory polyps of colon with unspecified complications: Secondary | ICD-10-CM | POA: Diagnosis not present

## 2022-04-17 DIAGNOSIS — Z79899 Other long term (current) drug therapy: Secondary | ICD-10-CM | POA: Diagnosis not present

## 2022-04-17 DIAGNOSIS — D126 Benign neoplasm of colon, unspecified: Secondary | ICD-10-CM | POA: Insufficient documentation

## 2022-04-18 ENCOUNTER — Encounter: Payer: Self-pay | Admitting: Oncology

## 2022-04-18 DIAGNOSIS — D509 Iron deficiency anemia, unspecified: Secondary | ICD-10-CM | POA: Insufficient documentation

## 2022-04-18 NOTE — Progress Notes (Signed)
Hematology/Oncology Consult note [facility] Telephone:([phone] Fax:([phone]  Patient Care Team: Einar Pheasant, MD as PCP - General (Internal Medicine)   Name of the patient: Ashley Gross  [id number]  Mar 22, 1951    Reason for referral-iron deficiency anemia   Referring physician- Dr. Marius Ditch  Date of visit: 04/18/22   History of presenting illness-patient is a 71 year-old female who was referred to GI for symptoms of diarrhea and iron deficiency anemia.  She has a history of inflammatory polyposis of the colon based on colonoscopy in September 2022.  She had some large polyps removed at that time which helped with anemia.  However patient continued to have intermittent rectal bleeding and after seeing Dr. Marius Ditch plan is to try [facility] for improvement of her inflammatory polyposis.  She has been referred for iron deficiency anemia.    Patient currently reports feeling fatigued.  Denies other complaints at this time  ECOG PS- 1  Pain scale- 0   Review of systems- Review of Systems  Constitutional:  Positive for malaise/fatigue. Negative for chills, fever and weight loss.  HENT:  Negative for congestion, ear discharge and nosebleeds.   Eyes:  Negative for blurred vision.  Respiratory:  Negative for cough, hemoptysis, sputum production, shortness of breath and wheezing.   Cardiovascular:  Negative for chest pain, palpitations, orthopnea and claudication.  Gastrointestinal:  Positive for blood in stool and diarrhea. Negative for abdominal pain, constipation, heartburn, melena, nausea and vomiting.  Genitourinary:  Negative for dysuria, flank pain, frequency, hematuria and urgency.  Musculoskeletal:  Negative for back pain, joint pain and myalgias.  Skin:  Negative for rash.  Neurological:  Negative for dizziness, tingling, focal weakness, seizures, weakness and headaches.  Endo/Heme/Allergies:  Does not bruise/bleed easily.   Psychiatric/Behavioral:  Negative for depression and suicidal ideas. The patient does not have insomnia.     Allergies  Allergen Reactions   Aspirin Other (See Comments)    Increased bleeding   Beta Adrenergic Blockers     3rd degree blockage    Lisinopril Cough   Mtx Support [Cobalamine Combinations] Other (See Comments)    Respiratory symptoms   Vasotec [Enalapril] Cough   Verapamil Other (See Comments)    Heart block   Voltaren [Diclofenac Sodium]     Pt unsure of reaction    Erythromycin Other (See Comments)    Gi intolerance   Indocin [Indomethacin]     Pt unsure of reaction    Jardiance [Empagliflozin] Other (See Comments)    Recurrent yeast infections, even with washout and rechallenge    Patient Active Problem List   Diagnosis Date Noted   Anemia 01/25/2022   Left inguinal hernia    Pre-op evaluation 11/26/2021   Meningioma ([place]) 11/05/2021   Inguinal hernia 10/31/2021   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   Loss of weight    Ear fullness 05/25/2021   Skin lesion 03/15/2021   Rib pain on left side 08/05/2020   Sleep difficulties 06/08/2020   Swelling of lower extremity 11/26/2019   Healthcare maintenance 09/13/2019   Asthma 12/28/2018   Vitamin D deficiency 11/01/2018   Tendinitis of upper biceps tendon of left shoulder 09/20/2018   Depression, major, single episode, mild ([place]) 09/15/2018   Mild intermittent asthma without complication 85/27/7824   Controlled type 2 diabetes mellitus with complication, with long-term current use of insulin ([place]) 09/15/2018   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  Traumatic incomplete tear of left rotator cuff 09/09/2018   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 Dilated CBD on abdominal imaging 04/2012   Chronic diarrhea 02/16/2013   Colitis    Complication of anesthesia    asthma attack after shoulder surgery   Diabetes mellitus ([place])    type II   Esophagitis    GERD (gastroesophageal reflux disease)    Heart murmur    for years- nothing to be concerned about.   History of kidney stones    Hx of arteriovenous malformation (AVM)    Hypertension    IBS (irritable bowel syndrome)    Pernicious anemia    Personal history of colonic polyps 02/10/2013   02/08/13 colonoscopy - question of rectal varices, two 3-53m polyps in the sigmoid colon, mass at the hepatic flexure, one 567mpolyp at the hepatic flexure, nodule at the ileocecal valve, congested mucusa in the entire colon, flattened villi mucosa in the terminal ileum.     Pneumonia    Psoriatic arthritis ([place]   s/p penicillamine, plaquenil, MTX, sulfasalazine.  s/p Embrel, Humira,.  Iritis.     Pure hypercholesterolemia    Rectal bleeding 07/15/2017     Past Surgical History:  Procedure Laterality Date   ABDOMINAL HYSTERECTOMY  1981   ovaries left in place   ANKLE SURGERY     right   BUNIONECTOMY     CHOLECYSTECTOMY  1989   COLONOSCOPY WITH PROPOFOL N/A 12/27/2017   Procedure: COLONOSCOPY WITH PROPOFOL;  Surgeon: VaLin LandsmanMD;  [facility];  Service: Gastroenterology;  Laterality: N/A;   COLONOSCOPY WITH PROPOFOL N/A 01/04/2019   Procedure: COLONOSCOPY WITH PROPOFOL;  Surgeon: VaLin LandsmanMD;  [place] Service: End[place];   Laterality: N/A;  Diabetic - oral and injectable   COLONOSCOPY WITH PROPOFOL N/A 07/24/2021   Procedure: COLONOSCOPY WITH PROPOFOL;  Surgeon: VaLin LandsmanMD;  [facility] Service: Endoscopy;  Laterality: N/A;  Diabetic   ESOPHAGOGASTRODUODENOSCOPY (EGD) WITH PROPOFOL N/A 12/27/2017   Procedure: ESOPHAGOGASTRODUODENOSCOPY (EGD) WITH PROPOFOL;  Surgeon: VaLin LandsmanMD;  [facility];  Service: Gastroenterology;  Laterality: N/A;   HAND SURGERY     right   HEEL SPUR SURGERY     IR 3D INDEPENDENT WKST  10/01/2021   IR ANGIO INTRA EXTRACRAN SEL COM CAROTID INNOMINATE UNI L MOD SED  10/01/2021   IR ANGIO INTRA EXTRACRAN SEL INTERNAL CAROTID UNI R MOD SED  10/01/2021   IR ANGIO VERTEBRAL SEL VERTEBRAL BILAT MOD SED  10/01/2021   IR CT HEAD LTD  10/01/2021   IR TRANSCATH/EMBOLIZ  10/01/2021   IR [place] VASC ACCESS RIGHT  10/01/2021   NOSE SURGERY     x2   POLYPECTOMY  01/04/2019   Procedure: POLYPECTOMY;  Surgeon: VaLin LandsmanMD;  [place]: [place]oscopy;;   POLYPECTOMY N/A 07/24/2021  Procedure: POLYPECTOMY;  Surgeon: Lin Landsman, MD;  Location: [place];  Service: Endoscopy;  Laterality: N/A;   RADIOLOGY WITH ANESTHESIA N/A 10/01/2021   Procedure: Starr Sinclair WITH ANESTHESIA;  Surgeon: Pedro Earls, MD;  Location: [place];  Service: Radiology;  Laterality: N/A;   SHOULDER ARTHROSCOPY WITH OPEN ROTATOR CUFF REPAIR Left 09/20/2018   Procedure: SHOULDER ARTHROSCOPY WITH OPEN ROTATOR CUFF REPAIR;  Surgeon: Corky Mull, MD;  Location: [facility];  Service: Orthopedics;  Laterality: Left;   SHOULDER CLOSED REDUCTION Left 12/21/2018   Procedure: MANIPULATION UNDER ANESTHESIA WITH STEROID INJECTION;  Surgeon: Corky Mull, MD;  Location: [place];  Service: Orthopedics;  Laterality: Left;  Diabetic - insulin and oral meds   UMBILICAL HERNIA REPAIR     XI ROBOTIC ASSISTED  INGUINAL HERNIA REPAIR WITH MESH Left 12/16/2021   Procedure: XI ROBOTIC ASSISTED INGUINAL HERNIA REPAIR WITH MESH;  Surgeon: Olean Ree, MD;  Location: [facility];  Service: General;  Laterality: Left;    Social History   Socioeconomic History   Marital status: Widowed    Spouse name: Not on file   Number of children: 2   Years of education: Not on file   Highest education level: Not on file  Occupational History   Not on file  Tobacco Use   Smoking status: Never   Smokeless tobacco: Never  Vaping Use   Vaping Use: Never used  Substance and Sexual Activity   Alcohol use: Never   Drug use: Never   Sexual activity: Not Currently  Other Topics Concern   Not on file  Social History Narrative   She is married and has two children   [place] daughter lives with her.   Social Determinants of Health   Financial Resource Strain: Medium Risk (11/05/2021)   Overall Financial Resource Strain (CARDIA)    Difficulty of Paying Living Expenses: Somewhat hard  Food Insecurity: No Food Insecurity (01/07/2021)   Hunger Vital Sign    Worried About Running Out of Food in the Last Year: Never true    Ran Out of Food in the Last Year: Never true  Transportation Needs: No Transportation Needs (01/07/2021)   PRAPARE - Hydrologist (Medical): No    Lack of Transportation (Non-Medical): No  Physical Activity: Not on file  Stress: No Stress Concern Present (02/24/2021)   [place]    Feeling of Stress : Only a little  Recent Concern: Stress - Stress Concern Present (12/26/2020)   [place]    Feeling of Stress : To some extent  Social Connections: Unknown (01/07/2021)   Social Connection and Isolation Panel [NHANES]    Frequency of Communication with Friends and Family: More than three times a week    Frequency of Social Gatherings with Friends  and Family: Not on file    Attends Religious Services: Not on file    Active Member of Clubs or Organizations: Not on file    Attends Archivist Meetings: Not on file    Marital Status: Not on file  Intimate Partner Violence: Not At Risk (01/07/2021)   Humiliation, Afraid, Rape, and Kick questionnaire    Fear of Current or Ex-Partner: No    Emotionally Abused: No    Physically Abused: No    Sexually Abused: No     Family History  Problem Relation Age of Onset  Diabetes Other    Hypertension Other    Breast cancer Neg Hx    Colon cancer Neg Hx      Current Outpatient Medications:    acetaminophen (TYLENOL) 325 MG tablet, Take 650 mg by mouth every 6 (six) hours as needed for moderate pain., Disp: , Rfl:    acetaminophen (TYLENOL) 500 MG tablet, Take 2 tablets (1,000 mg total) by mouth every 6 (six) hours as needed for mild pain., Disp: , Rfl:    albuterol (PROVENTIL) (2.5 MG/3ML) 0.083% nebulizer solution, Take 3 mLs (2.5 mg total) by nebulization every 4 (four) hours as needed for wheezing or shortness of breath., Disp: 360 mL, Rfl: 12   albuterol (VENTOLIN HFA) 108 (90 Base) MCG/ACT inhaler, Inhale 2 puffs into the lungs every 6 (six) hours as needed for wheezing or shortness of breath., Disp: , Rfl:    amLODipine (NORVASC) 5 MG tablet, TAKE 1 TABLET(5 MG) BY MOUTH TWICE DAILY, Disp: 180 tablet, Rfl: 1   budesonide-formoterol (SYMBICORT) 160-4.5 MCG/ACT inhaler, Inhale 2 puffs into the lungs 2 (two) times daily. (Patient taking differently: Inhale 2 puffs into the lungs 2 (two) times daily as needed (shortness of breath or wheezing).), Disp: 3 each, Rfl: 3   Cholecalciferol (VITAMIN D-3 PO), Take 2,000 Units by mouth daily., Disp: , Rfl:    CREON 36000-114000 units CPEP capsule, Take 36,000 Units by mouth 3 (three) times daily with meals. Also 1 capsule with evening snack, Disp: , Rfl:    diphenhydramine-acetaminophen (TYLENOL PM) 25-500 MG TABS tablet, Take 2 tablets by  mouth at bedtime., Disp: , Rfl:    gabapentin (NEURONTIN) 100 MG capsule, Take 100 mg by mouth daily., Disp: , Rfl:    insulin degludec (TRESIBA FLEXTOUCH) 100 UNIT/ML FlexTouch Pen, Inject 14 Units into the skin daily. (Patient taking differently: Inject 10 Units into the skin daily with supper.), Disp: 12 mL, Rfl: 2   loperamide (IMODIUM) 2 MG capsule, Take by mouth as needed for diarrhea or loose stools., Disp: , Rfl:    losartan (COZAAR) 100 MG tablet, Take 1 tablet (100 mg total) by mouth at bedtime., Disp: 90 tablet, Rfl: 1   Melatonin 10 MG TABS, Take 10 mg by mouth at bedtime., Disp: , Rfl:    omeprazole (PRILOSEC) 20 MG capsule, TAKE 1 CAPSULE(20 MG) BY MOUTH TWICE DAILY, Disp: 180 capsule, Rfl: 3   ONE TOUCH ULTRA TEST test strip, PATIENT NEEDS NEW METER STRIPS AND LANCETS FOR ONE TOUCH. HER INSURANCE NO LONGER COVERS ACCUCHEK., Disp: 100 each, Rfl: PRN   rosuvastatin (CRESTOR) 10 MG tablet, TAKE 1 TABLET(10 MG) BY MOUTH DAILY, Disp: 90 tablet, Rfl: 3   cyclobenzaprine (FLEXERIL) 10 MG tablet, TAKE 1 TABLET BY MOUTH EVERY NIGHT AS NEEDED (Patient not taking: Reported on 04/17/2022), Disp: 30 tablet, Rfl: 0   Physical exam:  Vitals:   04/17/22 1056  BP: 137/69  Pulse: 71  Resp: 14  SpO2: 100%  Weight: 111 lb 12.8 oz (50.7 kg)  Height: '5\' 4"'$  (1.626 m)   Physical Exam Cardiovascular:     Rate and Rhythm: Normal rate and regular rhythm.     Heart sounds: Normal heart sounds.  Pulmonary:     Effort: Pulmonary effort is normal.     Breath sounds: Normal breath sounds.  Abdominal:     General: Bowel sounds are normal.     Palpations: Abdomen is soft.  Skin:    General: Skin is warm and dry.  Neurological:  Mental Status: She is alert and oriented to person, place, and time.           Latest Ref Rng & Units 02/20/2022    9:04 AM  CMP  Glucose 70 - 99 mg/dL 54   BUN 6 - 23 mg/dL 11   Creatinine 0.40 - 1.20 mg/dL 0.81   Sodium 135 - 145 mEq/L 143   Potassium 3.5 -  5.1 mEq/L 3.8   Chloride 96 - 112 mEq/L 108   CO2 19 - 32 mEq/L 29   Calcium 8.4 - 10.5 mg/dL 7.9   Total Protein 6.0 - 8.3 g/dL 5.3   Total Bilirubin 0.2 - 1.2 mg/dL 0.3   Alkaline Phos 39 - 117 U/L 97   AST 0 - 37 U/L 18   ALT 0 - 35 U/L 11       Latest Ref Rng & Units 04/06/2022    3:03 PM  CBC  WBC 4.0 - 10.5 K/uL 5.1   Hemoglobin 12.0 - 15.0 g/dL 8.7 Repeated and verified X2.   Hematocrit 36.0 - 46.0 % 27.4   Platelets 150.0 - 400.0 K/uL 270.0    Assessment and plan- Patient is a 71 y.o. female referred for iron deficiency anemia   Iron deficiency anemia likely secondary to inflammatory polyposis.  Recently her hemoglobin was 8.7/27.4 with an MCV of 80.7 as compared to her baseline of 11-12.  Ferritin levels are low at 8.8.  She is unable to tolerate oral iron.  She will therefore proceed with IV iron at this time.  She will be getting 2 doses of Feraheme 510 mg IV weekly.  Discussed risks and benefits of Feraheme including all but not limited to possible risk of infusion anaphylactic reaction.  Patient understands and agrees to proceed as planned.  I will see her back in 2 months with CBC ferritin iron studies B12 folate and TSH  Thank you for this kind referral and the opportunity to participate in the care of this patient   Visit Diagnosis 1. Iron deficiency     Dr. Randa Evens, MD, MPH [facility] [phone] 04/18/2022

## 2022-04-20 ENCOUNTER — Telehealth: Payer: Self-pay

## 2022-04-20 ENCOUNTER — Telehealth: Payer: Self-pay | Admitting: Internal Medicine

## 2022-04-20 NOTE — Telephone Encounter (Signed)
Blanchie Serve with [facility] to find out the status of the medication

## 2022-04-20 NOTE — Telephone Encounter (Signed)
-----   Message from Lin Landsman, MD sent at 04/20/2022  8:23 AM EDT ----- Caryl Pina  Any update on her Weyman Rodney status?  Thanks  RV ----- Message ----- From: Sindy Guadeloupe, MD Sent: 04/18/2022   2:53 PM EDT To: Lin Landsman, MD; Einar Pheasant, MD

## 2022-04-20 NOTE — Telephone Encounter (Signed)
Emailed kari to find out

## 2022-04-20 NOTE — Telephone Encounter (Signed)
Copied from [facility] [id number]. Topic: Medicare AWV >> Apr 20, 2022 11:12 AM Devoria Glassing wrote: Reason for CRM: Called patient to schedule Annual Wellness Visit.  Please schedule with Nurse Health Advisor Denisa O'Brien-Blaney, LPN at [facility].  Please call [phone] ask for [name]

## 2022-04-20 NOTE — Telephone Encounter (Signed)
Entyvio patient assistance  RV

## 2022-04-20 NOTE — Telephone Encounter (Signed)
Informed Sharrie Rothman of this information

## 2022-04-20 NOTE — Telephone Encounter (Signed)
Can you ask Sharrie Rothman if Humira will be covered?  RV

## 2022-04-20 NOTE — Telephone Encounter (Signed)
Per Sharrie Rothman With [facility] since patient has medicare she can not have a copay card. She states her insurance will only cover the medication under Part D plan and not the medical. She is going to have a high copay if she goes to a out patient facility or through them. Out patient she will have to pay 20 percent every time she has the infusion until she meets 31,000 dollars . Patient states she can not afford that. Sharrie Rothman is going to get patient to fill out the [facility] connect application and send it to [place] to fill out also so we can get patient assistance for patient

## 2022-04-20 NOTE — Telephone Encounter (Signed)
She said she would still need patient assistance for the medication. Which one do you want her to try and apply for patient assistance

## 2022-04-21 ENCOUNTER — Encounter: Payer: Self-pay | Admitting: Internal Medicine

## 2022-04-21 LAB — QUANTIFERON-TB GOLD PLUS
QuantiFERON Mitogen Value: >10 IU/mL
QuantiFERON Nil Value: 0.05 IU/mL
QuantiFERON TB1 Ag Value: 0.06 IU/mL
QuantiFERON TB2 Ag Value: 0.06 IU/mL
QuantiFERON-TB Gold Plus: NEGATIVE

## 2022-04-21 NOTE — Assessment & Plan Note (Addendum)
On insulin.  Just decreased dose.  Follow sugars. May be able to decrease more.  Follow met b and a1c

## 2022-04-21 NOTE — Assessment & Plan Note (Signed)
Trying to cope with her husband's passing. Has good support.  Follow.

## 2022-04-21 NOTE — Assessment & Plan Note (Addendum)
S/p diagnostic cerebral angiogram with endovascular aneurysm embolization.  Evaluated by neurology.  MRI as outlined.  Referred to [facility].  appt 03/10/22.   Need to confirm f/u scheduled.

## 2022-04-21 NOTE — Assessment & Plan Note (Signed)
MRI as outlined.  Seeing neurology.  Planning appt with [facility].

## 2022-04-21 NOTE — Assessment & Plan Note (Signed)
Stable.  Has been followed by rheumatology.   

## 2022-04-21 NOTE — Assessment & Plan Note (Signed)
Increased stress related to current medical issues, etc.  Has good support.  Follow.

## 2022-04-21 NOTE — Assessment & Plan Note (Signed)
hgb continuing to decrease.  Iron deficient.  Continued rectal bleeding.  Just reevaluated by Dr Marius Ditch. Planning to start entyvio for treatment of inflammatory polyposis.  Due to see hematology tomorrow for evaluation IDA and iron infusions.  Intolerant to fusion plus.  Follow cbc.

## 2022-04-21 NOTE — Assessment & Plan Note (Signed)
Continue crestor.  Follow lipid panel and liver function tests.  

## 2022-04-21 NOTE — Assessment & Plan Note (Signed)
Blood pressure as outlined.  Continues on amlodipine and losartan.  Follow pressures. Follow metabolic panel.

## 2022-04-21 NOTE — Telephone Encounter (Signed)
Called patient primary number and is rang then went to a fax signal. Call mobile number and left a message for call back

## 2022-04-21 NOTE — Assessment & Plan Note (Signed)
S/p colonoscopy 07/24/21 as outlined.  Continues f/u with GI. Just evaluated given worsening anemia and persistent rectal bleeding.  Due to see hematology tomorrow. Planning to start entyvio.

## 2022-04-21 NOTE — Assessment & Plan Note (Signed)
Breathing is stable.  Will need to continue to use inhalers.

## 2022-04-21 NOTE — Telephone Encounter (Signed)
Informed patient I had the forms for her to filled out and needed the last W2 form. She states she will come today or tomorrow to fill out

## 2022-04-21 NOTE — Assessment & Plan Note (Signed)
No upper symptoms.  On omeprazole.  

## 2022-04-21 NOTE — Assessment & Plan Note (Signed)
MRI as outlined.  Seeing neurology.  F/u [facility].

## 2022-04-22 DIAGNOSIS — Z961 Presence of intraocular lens: Secondary | ICD-10-CM | POA: Diagnosis not present

## 2022-04-22 LAB — HM DIABETES EYE EXAM

## 2022-04-22 NOTE — Telephone Encounter (Signed)
Faxed the [facility] patient assistance form to [facility]. Patient signed her portion of the form and brought W2

## 2022-04-22 NOTE — Telephone Encounter (Signed)
Faxed to [phone] Phone [phone]

## 2022-04-24 ENCOUNTER — Telehealth: Payer: Self-pay

## 2022-04-27 ENCOUNTER — Other Ambulatory Visit (HOSPITAL_COMMUNITY): Payer: Self-pay | Admitting: Neuroradiology

## 2022-04-27 ENCOUNTER — Telehealth (HOSPITAL_COMMUNITY): Payer: Self-pay

## 2022-04-27 DIAGNOSIS — I671 Cerebral aneurysm, nonruptured: Secondary | ICD-10-CM

## 2022-04-27 NOTE — Telephone Encounter (Signed)
Patient is schedule for Infusion on 05/06/2022 arrive at 2:00 to [facility] called and left a message for call back

## 2022-04-27 NOTE — Telephone Encounter (Signed)
Patient can not do 05/06/2022 she has another appointment. Moved patient to 05/08/2022 at 10:00

## 2022-04-27 NOTE — Telephone Encounter (Addendum)
Patient is approved for [facility] patient assistance. She will not have to pay for the drug but will have to pay for the administration cost. She is approved till 11/01/22. They asked for the same days surgery phone number which was [phone]. They also need to address on where the shipment of the medication needs to be shipped. Called the same day surgery to ask this and Lelon Mast answered the phone and she will give me a call with this information. When we get the address we need to call entyvio connect back and ask for Judeth Cornfield at [phone] to give them the address.  Did go on on fax orders to same day infusion center

## 2022-04-27 NOTE — Telephone Encounter (Signed)
Called to schedule diagnostic angiogram with Dr. Rodrigues, no answer, left vm. AW  

## 2022-04-28 ENCOUNTER — Ambulatory Visit: Payer: PPO

## 2022-04-30 ENCOUNTER — Ambulatory Visit: Payer: PPO | Admitting: Gastroenterology

## 2022-04-30 MED FILL — Ferumoxytol Inj 510 MG/17ML (30 MG/ML) (Elemental Fe): INTRAVENOUS | Qty: 17 | Status: AC

## 2022-05-01 ENCOUNTER — Inpatient Hospital Stay: Payer: PPO

## 2022-05-01 VITALS — BP 118/70 | HR 55 | Temp 96.8°F | Resp 17

## 2022-05-01 DIAGNOSIS — D509 Iron deficiency anemia, unspecified: Secondary | ICD-10-CM | POA: Diagnosis not present

## 2022-05-01 DIAGNOSIS — D508 Other iron deficiency anemias: Secondary | ICD-10-CM

## 2022-05-01 MED ORDER — SODIUM CHLORIDE 0.9 % IV SOLN
Freq: Once | INTRAVENOUS | Status: AC
  Filled 2022-05-01: qty 250

## 2022-05-01 MED ORDER — SODIUM CHLORIDE 0.9 % IV SOLN
510.0000 mg | INTRAVENOUS | Status: DC
  Administered 2022-05-01: 510 mg via INTRAVENOUS
  Filled 2022-05-01: qty 510

## 2022-05-01 NOTE — Telephone Encounter (Signed)
Pt picked up medication.

## 2022-05-06 ENCOUNTER — Ambulatory Visit: Payer: PPO

## 2022-05-08 ENCOUNTER — Inpatient Hospital Stay: Payer: PPO | Attending: Oncology

## 2022-05-08 ENCOUNTER — Ambulatory Visit
Admission: RE | Admit: 2022-05-08 | Discharge: 2022-05-08 | Disposition: A | Discharge status: Home / Self Care | Payer: PPO | Source: Ambulatory Visit | Attending: Gastroenterology | Admitting: Gastroenterology

## 2022-05-08 VITALS — BP 111/62 | HR 65 | Temp 98.2°F | Resp 17

## 2022-05-08 DIAGNOSIS — D508 Other iron deficiency anemias: Secondary | ICD-10-CM

## 2022-05-08 DIAGNOSIS — D509 Iron deficiency anemia, unspecified: Secondary | ICD-10-CM | POA: Insufficient documentation

## 2022-05-08 DIAGNOSIS — K51411 Inflammatory polyps of colon with rectal bleeding: Secondary | ICD-10-CM | POA: Diagnosis not present

## 2022-05-08 MED ORDER — SODIUM CHLORIDE 0.9 % IV SOLN
Freq: Once | INTRAVENOUS | Status: AC
  Filled 2022-05-08: qty 250

## 2022-05-08 MED ORDER — VEDOLIZUMAB 300 MG IV SOLR
300.0000 mg | Freq: Once | INTRAVENOUS | Status: AC
  Administered 2022-05-08: 300 mg via INTRAVENOUS
  Filled 2022-05-08: qty 5

## 2022-05-08 MED ORDER — SODIUM CHLORIDE 0.9 % IV SOLN
510.0000 mg | INTRAVENOUS | Status: DC
  Administered 2022-05-08: 510 mg via INTRAVENOUS
  Filled 2022-05-08: qty 17

## 2022-05-08 MED ORDER — SODIUM CHLORIDE 0.9% FLUSH
10.0000 mL | Freq: Once | INTRAVENOUS | Status: AC | PRN
  Administered 2022-05-08: 10 mL
  Filled 2022-05-08: qty 10

## 2022-05-08 NOTE — Patient Instructions (Signed)

## 2022-05-08 NOTE — Progress Notes (Signed)
Patient tolerated Venofer infusion well, no questions/concerns voiced. Patient stable at discharge. AVS given.   ?

## 2022-05-13 ENCOUNTER — Ambulatory Visit (INDEPENDENT_AMBULATORY_CARE_PROVIDER_SITE_OTHER): Payer: PPO

## 2022-05-13 VITALS — Ht 64.0 in | Wt 111.0 lb

## 2022-05-13 DIAGNOSIS — Z0000 Encounter for general adult medical examination without abnormal findings: Secondary | ICD-10-CM

## 2022-05-13 NOTE — Patient Instructions (Addendum)
  Ashley Gross , Thank you for taking time to come for your Medicare Wellness Visit. I appreciate your ongoing commitment to your health goals. Please review the following plan we discussed and let me know if I can assist you in the future.   These are the goals we discussed:  Goals      Healthy Lifestyle     Low carb foods Stay hydrated and drink plenty of water Stay active         This is a list of the screening recommended for you and due dates:  Health Maintenance  Topic Date Due   Zoster (Shingles) Vaccine (1 of 2) 05/28/2022*   COVID-19 Vaccine (5 - Booster for Pfizer series) 05/29/2022*   Tetanus Vaccine  01/23/2023*   Hepatitis C Screening: USPSTF Recommendation to screen - Ages 18-79 yo.  02/27/2023*   Complete foot exam   05/23/2022   Flu Shot  06/02/2022   Colon Cancer Screening  07/24/2022   Hemoglobin A1C  08/22/2022   Mammogram  03/22/2023   Eye exam for diabetics  05/01/2023   Pneumonia Vaccine  Completed   DEXA scan (bone density measurement)  Completed   HPV Vaccine  Aged Out  *Topic was postponed. The date shown is not the original due date.

## 2022-05-13 NOTE — Progress Notes (Addendum)
Subjective:   Ashley Gross is a 71 y.o. female who presents for Medicare Annual (Subsequent) preventive examination.  Review of Systems    No ROS.  Medicare Wellness Virtual Visit.  Visual/audio telehealth visit, UTA vital signs.   See social history for additional risk factors.   Cardiac Risk Factors include: advanced age (>23mn, >>34women)     Objective:    Today's Vitals   05/13/22 1535  Weight: 111 lb (50.3 kg)  Height: '5\' 4"'$  (1.626 m)   Body mass index is 19.05 kg/m.     05/13/2022    3:37 PM 04/17/2022   10:39 AM 12/16/2021   11:15 AM 12/10/2021    9:38 AM 11/14/2021    3:00 PM 10/01/2021    6:00 PM 07/24/2021    7:16 AM  Advanced Directives  Does Patient Have a Medical Advance Directive? No No Yes No No No No  Type of Advance Directive   HTescottin Chart?   No - copy requested      Would patient like information on creating a medical advance directive? No - Patient declined No - Patient declined   No - Patient declined No - Patient declined No - Patient declined    Current Medications (verified) Outpatient Encounter Medications as of 05/13/2022  Medication Sig   acetaminophen (TYLENOL) 325 MG tablet Take 650 mg by mouth every 6 (six) hours as needed for moderate pain.   acetaminophen (TYLENOL) 500 MG tablet Take 2 tablets (1,000 mg total) by mouth every 6 (six) hours as needed for mild pain.   albuterol (PROVENTIL) (2.5 MG/3ML) 0.083% nebulizer solution Take 3 mLs (2.5 mg total) by nebulization every 4 (four) hours as needed for wheezing or shortness of breath.   albuterol (VENTOLIN HFA) 108 (90 Base) MCG/ACT inhaler Inhale 2 puffs into the lungs every 6 (six) hours as needed for wheezing or shortness of breath.   amLODipine (NORVASC) 5 MG tablet TAKE 1 TABLET(5 MG) BY MOUTH TWICE DAILY   budesonide-formoterol (SYMBICORT) 160-4.5 MCG/ACT inhaler Inhale 2 puffs into the lungs 2 (two) times daily.  (Patient taking differently: Inhale 2 puffs into the lungs 2 (two) times daily as needed (shortness of breath or wheezing).)   Cholecalciferol (VITAMIN D-3 PO) Take 2,000 Units by mouth daily.   CREON 36000-114000 units CPEP capsule Take 36,000 Units by mouth 3 (three) times daily with meals. Also 1 capsule with evening snack   cyclobenzaprine (FLEXERIL) 10 MG tablet TAKE 1 TABLET BY MOUTH EVERY NIGHT AS NEEDED (Patient not taking: Reported on 04/17/2022)   diphenhydramine-acetaminophen (TYLENOL PM) 25-500 MG TABS tablet Take 2 tablets by mouth at bedtime.   gabapentin (NEURONTIN) 100 MG capsule Take 100 mg by mouth daily.   insulin degludec (TRESIBA FLEXTOUCH) 100 UNIT/ML FlexTouch Pen Inject 14 Units into the skin daily. (Patient taking differently: Inject 10 Units into the skin daily with supper.)   loperamide (IMODIUM) 2 MG capsule Take by mouth as needed for diarrhea or loose stools.   losartan (COZAAR) 100 MG tablet Take 1 tablet (100 mg total) by mouth at bedtime.   Melatonin 10 MG TABS Take 10 mg by mouth at bedtime.   omeprazole (PRILOSEC) 20 MG capsule TAKE 1 CAPSULE(20 MG) BY MOUTH TWICE DAILY   ONE TOUCH ULTRA TEST test strip PATIENT NEEDS NEW METER STRIPS AND LANCETS FOR ONE TOUCH. HER INSURANCE NO LONGER COVERS ACCUCHEK.   rosuvastatin (CRESTOR)  10 MG tablet TAKE 1 TABLET(10 MG) BY MOUTH DAILY   No facility-administered encounter medications on file as of 05/13/2022.    Allergies (verified) Aspirin, Beta adrenergic blockers, Lisinopril, Mtx support [cobalamine combinations], Vasotec [enalapril], Verapamil, Voltaren [diclofenac sodium], Erythromycin, Indocin [indomethacin], and Jardiance [empagliflozin]   History: Past Medical History:  Diagnosis Date   Abnormal liver function test 10/06/2012   Anemia, unspecified    Asthma    B12 deficiency 02/11/2019   Bleeding internal hemorrhoids 09/13/2017   Choledocholithiasis 06/29/2012   Formatting of this note might be different from  the original. Dilated CBD on abdominal imaging 04/2012   Chronic diarrhea 02/16/2013   Colitis    Complication of anesthesia    asthma attack after shoulder surgery   Diabetes mellitus ([place])    type II   Esophagitis    GERD (gastroesophageal reflux disease)    Heart murmur    for years- nothing to be concerned about.   History of kidney stones    Hx of arteriovenous malformation (AVM)    Hypertension    IBS (irritable bowel syndrome)    Pernicious anemia    Personal history of colonic polyps 02/10/2013   02/08/13 colonoscopy - question of rectal varices, two 3-61m polyps in the sigmoid colon, mass at the hepatic flexure, one 589mpolyp at the hepatic flexure, nodule at the ileocecal valve, congested mucusa in the entire colon, flattened villi mucosa in the terminal ileum.     Pneumonia    Psoriatic arthritis ([facility]   s/p penicillamine, plaquenil, MTX, sulfasalazine.  s/p Embrel, Humira,.  Iritis.     Pure hypercholesterolemia    Rectal bleeding 07/15/2017   Past Surgical History:  Procedure Laterality Date   ABDOMINAL HYSTERECTOMY  1981   ovaries left in place   ANKLE SURGERY     right   BUNIONECTOMY     CHOLECYSTECTOMY  1989   COLONOSCOPY WITH PROPOFOL N/A 12/27/2017   Procedure: COLONOSCOPY WITH PROPOFOL;  Surgeon: VaLin LandsmanMD;  [facility];  Service: Gastroenterology;  Laterality: N/A;   COLONOSCOPY WITH PROPOFOL N/A 01/04/2019   Procedure: COLONOSCOPY WITH PROPOFOL;  Surgeon: VaLin LandsmanMD;  [place] [facility] [place]: [place];  Laterality: N/A;  Diabetic - oral and injectable   COLONOSCOPY WITH PROPOFOL N/A 07/24/2021   Procedure: COLONOSCOPY WITH PROPOFOL;  Surgeon: VaLin LandsmanMD;  [place] [facility] Service: Endoscopy;  Laterality: N/A;  Diabetic   ESOPHAGOGASTRODUODENOSCOPY (EGD) WITH PROPOFOL N/A 12/27/2017   Procedure: ESOPHAGOGASTRODUODENOSCOPY (EGD) WITH PROPOFOL;  Surgeon: VaLin LandsmanMD;   [facility];  Service: Gastroenterology;  Laterality: N/A;   HAND SURGERY     right   HEEL SPUR SURGERY     IR 3D INDEPENDENT WKST  10/01/2021   IR ANGIO INTRA EXTRACRAN SEL COM CAROTID INNOMINATE UNI L MOD SED  10/01/2021   IR ANGIO INTRA EXTRACRAN SEL INTERNAL CAROTID UNI R MOD SED  10/01/2021   IR ANGIO VERTEBRAL SEL VERTEBRAL BILAT MOD SED  10/01/2021   IR CT HEAD LTD  10/01/2021   IR TRANSCATH/EMBOLIZ  10/01/2021   IR [place] VASC ACCESS RIGHT  10/01/2021   NOSE SURGERY     x2   POLYPECTOMY  01/04/2019   Procedure: POLYPECTOMY;  Surgeon: VaLin LandsmanMD;  [facility] [facility] Service: Endoscopy;;   POLYPECTOMY N/A 07/24/2021   Procedure: POLYPECTOMY;  Surgeon: VaLin LandsmanMD;  [place] [facility] Service: Endoscopy;  Laterality: N/A;   RADIOLOGY WITH ANESTHESIA N/A 10/01/2021   Procedure: Starr Sinclair WITH ANESTHESIA;  Surgeon: Pedro Earls, MD;  Location: [place];  Service: Radiology;  Laterality: N/A;   SHOULDER ARTHROSCOPY WITH OPEN ROTATOR CUFF REPAIR Left 09/20/2018   Procedure: SHOULDER ARTHROSCOPY WITH OPEN ROTATOR CUFF REPAIR;  Surgeon: Corky Mull, MD;  [facility];  Service: Orthopedics;  Laterality: Left;   SHOULDER CLOSED REDUCTION Left 12/21/2018   Procedure: MANIPULATION UNDER ANESTHESIA WITH STEROID INJECTION;  Surgeon: Corky Mull, MD;  Location: [place];  Service: Orthopedics;  Laterality: Left;  Diabetic - insulin and oral meds   UMBILICAL HERNIA REPAIR     XI ROBOTIC ASSISTED INGUINAL HERNIA REPAIR WITH MESH Left 12/16/2021   Procedure: XI ROBOTIC ASSISTED INGUINAL HERNIA REPAIR WITH MESH;  Surgeon: Olean Ree, MD;  Location: [facility];  Service: General;  Laterality: Left;   Family History  Problem Relation Age of Onset   Diabetes Other    Hypertension Other    Breast cancer Neg Hx    Colon cancer Neg Hx    Social History   Socioeconomic History   Marital status:  Widowed    Spouse name: Not on file   Number of children: 2   Years of education: Not on file   Highest education level: Not on file  Occupational History   Not on file  Tobacco Use   Smoking status: Never   Smokeless tobacco: Never  Vaping Use   Vaping Use: Never used  Substance and Sexual Activity   Alcohol use: Never   Drug use: Never   Sexual activity: Not Currently  Other Topics Concern   Not on file  Social History Narrative   She is married and has two children   [place] daughter lives with her.   Social Determinants of Health   Financial Resource Strain: Low Risk  (05/13/2022)   Overall Financial Resource Strain (CARDIA)    Difficulty of Paying Living Expenses: Not hard at all  Food Insecurity: No Food Insecurity (05/13/2022)   Hunger Vital Sign    Worried About Running Out of Food in the Last Year: Never true    Ran Out of Food in the Last Year: Never true  Transportation Needs: No Transportation Needs (05/13/2022)   PRAPARE - Hydrologist (Medical): No    Lack of Transportation (Non-Medical): No  Physical Activity: Not on file  Stress: No Stress Concern Present (05/13/2022)   [place]    Feeling of Stress : Not at all  Social Connections: Unknown (01/07/2021)   Social Connection and Isolation Panel [NHANES]    Frequency of Communication with Friends and Family: More than three times a week    Frequency of Social Gatherings with Friends and Family: Not on file    Attends Religious Services: Not on Advertising copywriter or Organizations: Not on file    Attends Archivist Meetings: Not on file    Marital Status: Not on file    Tobacco Counseling Counseling given: Not Answered  Clinical Intake: Pre-visit preparation completed: No        Diabetes: Yes (Followed by PCP)  How often do you need to have someone help you when you read instructions,  pamphlets, or other written materials from your doctor or pharmacy?: 1 - Never   Interpreter Needed?: No    Activities of Daily Living  05/13/2022    3:37 PM 12/10/2021    9:40 AM  In your present state of health, do you have any difficulty performing the following activities:  Hearing? 0   Vision? 0   Difficulty concentrating or making decisions? 0   Walking or climbing stairs? 0   Dressing or bathing? 0   Doing errands, shopping? 0 0  Preparing Food and eating ? N   Using the Toilet? N   In the past six months, have you accidently leaked urine? N   Do you have problems with loss of bowel control? Y   Comment Chronic diarrhea. Followed by PCP and GI.   Managing your Medications? N   Managing your Finances? N   Housekeeping or managing your Housekeeping? N     Patient Care Team: Einar Pheasant, MD as PCP - General (Internal Medicine)  Indicate any recent Medical Services you may have received from other than [facility] providers in the past year (date may be approximate).     Assessment:   This is a routine wellness examination for Ashley Gross.  Virtual Visit via Telephone Note  I connected with  Ashley Gross on 05/13/22 at  3:30 PM EDT by telephone and verified that I am speaking with the correct person using two identifiers.  Persons participating in the virtual visit: patient/Nurse Health Advisor   I discussed the limitations of performing an evaluation and management service by telehealth. We continued and completed visit with audio only. Some vital signs may be absent or patient reported.   Hearing/Vision screen Hearing Screening - Comments:: Patient is able to hear conversational tones without difficulty. No issues reported. Vision Screening - Comments:: Followed by [facility] (Dr. Jeni Salles)  Wears corrective lenses Cataract extraction, bilateral  Dietary issues and exercise activities discussed: Current Exercise Habits: Home exercise routine, Intensity:  Mild   Goals Addressed             This Visit's Progress    Healthy Lifestyle       Low carb foods Stay hydrated and drink plenty of water Stay active        Depression Screen    05/13/2022    4:24 PM 04/16/2022    7:26 AM 02/26/2022    4:01 PM 01/22/2022    3:10 PM 07/24/2021    2:37 PM 03/13/2021    3:15 PM 01/07/2021    2:09 PM  PHQ 2/9 Scores  PHQ - 2 Score 0 2 2 0 0 2 0  PHQ- 9 Score 0 4 4 0  9     Fall Risk    04/16/2022    7:26 AM 02/26/2022    4:01 PM 01/22/2022    3:12 PM 01/22/2022    3:09 PM 12/31/2021    2:03 PM  Fall Risk   Falls in the past year? 0 0 1 0 0  Number falls in past yr:   1 0   Injury with Fall?   1 0   Risk for fall due to : History of fall(s);Impaired balance/gait;Impaired mobility No Fall Risks;Impaired balance/gait History of fall(s) No Fall Risks   Follow up Falls evaluation completed Falls evaluation completed Falls evaluation completed Falls evaluation completed     [place]: Home free of loose throw rugs in walkways, pet beds, electrical cords, etc? Yes  Adequate lighting in your home to reduce risk of falls? Yes   ASSISTIVE DEVICES UTILIZED TO PREVENT FALLS: Life alert? No  Use of a cane, walker or w/c? No   TIMED UP AND GO: Was the test performed? No .   Cognitive Function: Patient is alert and oriented x3.      05/17/2017   11:50 AM  MMSE - Mini Mental State Exam  Orientation to time 5  Orientation to Place 5  Registration 3  Attention/ Calculation 5  Recall 3  Language- name 2 objects 2  Language- repeat 1  Language- follow 3 step command 3  Language- read & follow direction 1  Write a sentence 1  Copy design 1  Total score 30       Immunizations Immunization History  Administered Date(s) Administered   Fluad Quad(high Dose 65+) 07/20/2019, 07/23/2020, 07/24/2021   Influenza Whole 07/03/2012   Influenza, High Dose Seasonal PF 07/12/2017, 08/01/2018   Influenza, Seasonal,  Injecte, Preservative Fre 09/04/2013   Influenza-Unspecified 07/17/2014, 07/31/2015, 07/15/2016   PFIZER Comirnaty(Gray Top)Covid-19 Tri-Sucrose Vaccine 03/25/2021   PFIZER(Purple Top)SARS-COV-2 Vaccination 12/13/2019, 01/03/2020, 08/15/2020   Pneumococcal Conjugate-13 05/17/2017   Pneumococcal Polysaccharide-23 11/13/2020   Screening Tests Health Maintenance  Topic Date Due   Zoster Vaccines- Shingrix (1 of 2) 05/28/2022 (Originally 01/24/1970)   COVID-19 Vaccine (5 - Booster for [place] series) 05/29/2022 (Originally 05/20/2021)   TETANUS/TDAP  01/23/2023 (Originally 01/24/1970)   Hepatitis C Screening  02/27/2023 (Originally 01/24/1969)   FOOT EXAM  05/23/2022   INFLUENZA VACCINE  06/02/2022   COLONOSCOPY (Pts 45-59yr Insurance coverage will need to be confirmed)  07/24/2022   HEMOGLOBIN A1C  08/22/2022   MAMMOGRAM  03/22/2023   OPHTHALMOLOGY EXAM  05/01/2023   Pneumonia Vaccine 71 Years old  Completed   DEXA SCAN  Completed   HPV VACCINES  Aged Out   Health Maintenance There are no preventive care reminders to display for this patient.  Lung Cancer Screening: (Low Dose CT Chest recommended if Age 71-80years, 30 pack-year currently smoking OR have quit w/in 15years.) does not qualify.   Vision Screening: Recommended annual ophthalmology exams for early detection of glaucoma and other disorders of the eye.  Dental Screening: Recommended annual dental exams for proper oral hygiene  Community Resource Referral / Chronic Care Management: CRR required this visit?  No   CCM required this visit?  No      Plan:   Keep all routine maintenance appointments.   I have personally reviewed and noted the following in the patient's chart:   Medical and social history Use of alcohol, tobacco or illicit drugs  Current medications and supplements including opioid prescriptions.  Functional ability and status Nutritional status Physical activity Advanced directives List of other  physicians Hospitalizations, surgeries, and ER visits in previous 12 months Vitals Screenings to include cognitive, depression, and falls Referrals and appointments  In addition, I have reviewed and discussed with patient certain preventive protocols, quality metrics, and best practice recommendations. A written personalized care plan for preventive services as well as general preventive health recommendations were provided to patient.     OBrien-Blaney, Que Meneely L, LPN   [id number]     I have reviewed the above information and agree with above.   TDeborra Medina MD

## 2022-05-16 ENCOUNTER — Encounter: Payer: Self-pay | Admitting: Oncology

## 2022-05-21 DIAGNOSIS — I729 Aneurysm of unspecified site: Secondary | ICD-10-CM | POA: Diagnosis not present

## 2022-05-21 DIAGNOSIS — E236 Other disorders of pituitary gland: Secondary | ICD-10-CM | POA: Diagnosis not present

## 2022-05-21 DIAGNOSIS — D329 Benign neoplasm of meninges, unspecified: Secondary | ICD-10-CM | POA: Diagnosis not present

## 2022-05-21 DIAGNOSIS — G4700 Insomnia, unspecified: Secondary | ICD-10-CM | POA: Diagnosis not present

## 2022-05-21 DIAGNOSIS — R42 Dizziness and giddiness: Secondary | ICD-10-CM | POA: Diagnosis not present

## 2022-05-22 ENCOUNTER — Ambulatory Visit
Admission: RE | Admit: 2022-05-22 | Discharge: 2022-05-22 | Disposition: A | Discharge status: Home / Self Care | Payer: PPO | Source: Ambulatory Visit | Attending: Gastroenterology | Admitting: Gastroenterology

## 2022-05-22 DIAGNOSIS — K51411 Inflammatory polyps of colon with rectal bleeding: Secondary | ICD-10-CM | POA: Insufficient documentation

## 2022-05-22 DIAGNOSIS — K625 Hemorrhage of anus and rectum: Secondary | ICD-10-CM | POA: Diagnosis present

## 2022-05-22 MED ORDER — VEDOLIZUMAB 300 MG IV SOLR
300.0000 mg | Freq: Once | INTRAVENOUS | Status: AC
  Administered 2022-05-22: 300 mg via INTRAVENOUS
  Filled 2022-05-22: qty 5

## 2022-05-24 ENCOUNTER — Other Ambulatory Visit: Payer: Self-pay | Admitting: Internal Medicine

## 2022-05-27 ENCOUNTER — Other Ambulatory Visit (HOSPITAL_COMMUNITY): Payer: Self-pay | Admitting: Student

## 2022-05-27 DIAGNOSIS — I671 Cerebral aneurysm, nonruptured: Secondary | ICD-10-CM

## 2022-05-28 ENCOUNTER — Other Ambulatory Visit: Payer: Self-pay | Admitting: Internal Medicine

## 2022-05-28 ENCOUNTER — Other Ambulatory Visit: Payer: Self-pay | Admitting: Student

## 2022-05-28 NOTE — H&P (Signed)
Chief Complaint: Patient was seen in consultation today for diagnostic cerebral angiogram to evaluate treated anterior communicating artery aneurysm  Referring Physician(s): [place]  Supervising Physician: Pedro Earls  Patient Status: [facility] - Out-pt  History of Present Illness: Ashley Gross is a 71 y.o. female with a medical history significant for DM2, colitis, asthma, HTN, anemia, left inguinal hernia s/p repair 12/16/21 and arteriovenous malformation. 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 She was referred to [facility] for management/treatment options and underwent a diagnostic cerebral angiogram with endovascular aneurysm embolization 10/01/21. She tolerated the procedure well and was discharged home 10/02/21.  In the post-procedure weeks/months she developed persistent headaches without focal neurological deficit. Imaging was negative for any acute findings and the headaches were attributed to local inflammatory changes related to the treated aneurysm. She was prescribed a Medrol dosepak and has been doing well.   She presents today for a routine diagnostic cerebral angiogram to evaluate the embolized anterior communicating artery aneurysm. She denies any headaches, weakness, blurred vision or dizziness.   Past Medical History:  Diagnosis Date   Abnormal liver function test 10/06/2012   Anemia, unspecified    Asthma    B12 deficiency 02/11/2019   Bleeding internal hemorrhoids 09/13/2017   Choledocholithiasis 06/29/2012   Formatting of this note might be different from the original. Dilated CBD on abdominal imaging 04/2012   Chronic diarrhea 02/16/2013   Colitis    Complication of anesthesia    asthma attack after shoulder surgery   Diabetes mellitus ([place])    type II   Esophagitis    GERD (gastroesophageal  reflux disease)    Heart murmur    for years- nothing to be concerned about.   History of kidney stones    Hx of arteriovenous malformation (AVM)    Hypertension    IBS (irritable bowel syndrome)    Pernicious anemia    Personal history of colonic polyps 02/10/2013   02/08/13 colonoscopy - question of rectal varices, two 3-78m polyps in the sigmoid colon, mass at the hepatic flexure, one 546mpolyp at the hepatic flexure, nodule at the ileocecal valve, congested mucusa in the entire colon, flattened villi mucosa in the terminal ileum.     Pneumonia    Psoriatic arthritis ([place]   s/p penicillamine, plaquenil, MTX, sulfasalazine.  s/p Embrel, Humira,.  Iritis.     Pure hypercholesterolemia    Rectal bleeding 07/15/2017    Past Surgical History:  Procedure Laterality Date   ABDOMINAL HYSTERECTOMY  1981   ovaries left in place   ANKLE SURGERY     right   BUNIONECTOMY     CHOLECYSTECTOMY  1989   COLONOSCOPY WITH PROPOFOL N/A 12/27/2017   Procedure: COLONOSCOPY WITH PROPOFOL;  Surgeon: VaLin LandsmanMD;  [facility]  Service: Gastroenterology;  Laterality: N/A;   COLONOSCOPY WITH PROPOFOL N/A 01/04/2019   Procedure: COLONOSCOPY WITH PROPOFOL;  Surgeon: VaLin LandsmanMD;  [place] Service: Endoscopy;  Laterality: N/A;  Diabetic - oral and injectable   COLONOSCOPY WITH PROPOFOL N/A 07/24/2021   Procedure: COLONOSCOPY WITH PROPOFOL;  Surgeon: VaLin LandsmanMD;  [place] Service: Endoscopy;  Laterality: N/A;  Diabetic   ESOPHAGOGASTRODUODENOSCOPY (EGD) WITH PROPOFOL N/A 12/27/2017   Procedure: ESOPHAGOGASTRODUODENOSCOPY (EGD) WITH PROPOFOL;  Surgeon: VaLin LandsmanMD;  [place]  Service: Gastroenterology;  Laterality: N/A;   HAND SURGERY     right   HEEL SPUR SURGERY     IR 3D INDEPENDENT WKST  10/01/2021   IR ANGIO INTRA EXTRACRAN SEL COM CAROTID INNOMINATE UNI L MOD SED  10/01/2021   IR  ANGIO INTRA EXTRACRAN SEL INTERNAL CAROTID UNI R MOD SED  10/01/2021   IR ANGIO VERTEBRAL SEL VERTEBRAL BILAT MOD SED  10/01/2021   IR CT HEAD LTD  10/01/2021   IR TRANSCATH/EMBOLIZ  10/01/2021   IR US GUIDE VASC ACCESS RIGHT  10/01/2021   NOSE SURGERY     x2   POLYPECTOMY  01/04/2019   Procedure: POLYPECTOMY;  Surgeon: Lin Landsman, MD;  Location: [place];  Service: Endoscopy;;   POLYPECTOMY N/A 07/24/2021   Procedure: POLYPECTOMY;  Surgeon: Lin Landsman, MD;  Location: [place];  Service: Endoscopy;  Laterality: N/A;   RADIOLOGY WITH ANESTHESIA N/A 10/01/2021   Procedure: Starr Sinclair WITH ANESTHESIA;  Surgeon: Pedro Earls, MD;  Location: [place];  Service: Radiology;  Laterality: N/A;   SHOULDER ARTHROSCOPY WITH OPEN ROTATOR CUFF REPAIR Left 09/20/2018   Procedure: SHOULDER ARTHROSCOPY WITH OPEN ROTATOR CUFF REPAIR;  Surgeon: Corky Mull, MD;  Location: [facility];  Service: Orthopedics;  Laterality: Left;   SHOULDER CLOSED REDUCTION Left 12/21/2018   Procedure: MANIPULATION UNDER ANESTHESIA WITH STEROID INJECTION;  Surgeon: Corky Mull, MD;  Location: [place];  Service: Orthopedics;  Laterality: Left;  Diabetic - insulin and oral meds   UMBILICAL HERNIA REPAIR     XI ROBOTIC ASSISTED INGUINAL HERNIA REPAIR WITH MESH Left 12/16/2021   Procedure: XI ROBOTIC ASSISTED INGUINAL HERNIA REPAIR WITH MESH;  Surgeon: Olean Ree, MD;  Location: [facility];  Service: General;  Laterality: Left;    Allergies: Aspirin, Beta adrenergic blockers, Lisinopril, Mtx support [cobalamine combinations], Vasotec [enalapril], Verapamil, Voltaren [diclofenac sodium], Erythromycin, Indocin [indomethacin], and Jardiance [empagliflozin]  Medications: Prior to Admission medications   Medication Sig Start Date End Date Taking? Authorizing Provider  acetaminophen (TYLENOL) 500 MG tablet Take 2 tablets (1,000 mg total) by mouth every 6 (six) hours as  needed for mild pain. 12/16/21  Yes Piscoya, Jacqulyn Bath, MD  albuterol (PROVENTIL) (2.5 MG/3ML) 0.083% nebulizer solution Take 3 mLs (2.5 mg total) by nebulization every 4 (four) hours as needed for wheezing or shortness of breath. 04/02/21  Yes McLean-Scocuzza, Nino Glow, MD  albuterol (VENTOLIN HFA) 108 (90 Base) MCG/ACT inhaler Inhale 2 puffs into the lungs every 6 (six) hours as needed for wheezing or shortness of breath.   Yes [provider]  amLODipine (NORVASC) 5 MG tablet TAKE 1 TABLET(5 MG) BY MOUTH TWICE DAILY 08/26/21  Yes Einar Pheasant, MD  budesonide-formoterol [facility]) 160-4.5 MCG/ACT inhaler Inhale 2 puffs into the lungs 2 (two) times daily. Patient taking differently: Inhale 2 puffs into the lungs 2 (two) times daily as needed (shortness of breath). 11/05/21  Yes Einar Pheasant, MD  Cholecalciferol (VITAMIN D) 50 MCG (2000 UT) tablet Take 2,000 Units by mouth daily.   Yes [provider]  CREON 36000-114000 units CPEP capsule Take 06,237-62,831 Units by mouth See admin instructions. Take 720000 units with each meal and 36000 units with snacks 08/08/21  Yes [provider]  diphenhydramine-acetaminophen (TYLENOL PM) 25-500 MG TABS tablet Take 2 tablets by mouth at bedtime as needed (sleep).   Yes [provider]  fluticasone (FLOVENT HFA) 110 MCG/ACT inhaler Inhale 2 puffs into the lungs 2 (two) times daily  as needed (shortness of breath).   Yes [provider]  gabapentin (NEURONTIN) 100 MG capsule Take 400 mg by mouth at bedtime. 01/12/22  Yes [provider]  insulin degludec (TRESIBA FLEXTOUCH) 100 UNIT/ML FlexTouch Pen Inject 14 Units into the skin daily. Patient taking differently: Inject 10 Units into the skin daily with supper. 11/05/21  Yes Einar Pheasant, MD  loperamide (IMODIUM) 2 MG capsule Take 4 mg by mouth daily.   Yes [provider]  losartan (COZAAR) 100 MG tablet Take 1 tablet (100 mg total) by mouth at bedtime.  01/29/22  Yes Einar Pheasant, MD  Melatonin 10 MG TABS Take 10 mg by mouth at bedtime.   Yes [provider]  omeprazole (PRILOSEC) 20 MG capsule TAKE 1 CAPSULE(20 MG) BY MOUTH TWICE DAILY 06/27/21  Yes Einar Pheasant, MD  rosuvastatin (CRESTOR) 10 MG tablet TAKE 1 TABLET(10 MG) BY MOUTH DAILY 03/26/22  Yes Einar Pheasant, MD  ONE TOUCH ULTRA TEST test strip PATIENT NEEDS NEW METER STRIPS AND LANCETS FOR ONE TOUCH. HER INSURANCE NO LONGER COVERS ACCUCHEK. 07/31/15   Einar Pheasant, MD     Family History  Problem Relation Age of Onset   Diabetes Other    Hypertension Other    Breast cancer Neg Hx    Colon cancer Neg Hx     Social History   Socioeconomic History   Marital status: Widowed    Spouse name: Not on file   Number of children: 2   Years of education: Not on file   Highest education level: Not on file  Occupational History   Not on file  Tobacco Use   Smoking status: Never   Smokeless tobacco: Never  Vaping Use   Vaping Use: Never used  Substance and Sexual Activity   Alcohol use: Never   Drug use: Never   Sexual activity: Not Currently  Other Topics Concern   Not on file  Social History Narrative   She is married and has two children   [place] daughter lives with her.   Social Determinants of Health   Financial Resource Strain: Low Risk  (05/13/2022)   Overall Financial Resource Strain (CARDIA)    Difficulty of Paying Living Expenses: Not hard at all  Food Insecurity: No Food Insecurity (05/13/2022)   Hunger Vital Sign    Worried About Running Out of Food in the Last Year: Never true    Ran Out of Food in the Last Year: Never true  Transportation Needs: No Transportation Needs (05/13/2022)   PRAPARE - Hydrologist (Medical): No    Lack of Transportation (Non-Medical): No  Physical Activity: Not on file  Stress: No Stress Concern Present (05/13/2022)   [place]    Feeling of Stress : Not at all  Social Connections: Unknown (01/07/2021)   Social Connection and Isolation Panel [NHANES]    Frequency of Communication with Friends and Family: More than three times a week    Frequency of Social Gatherings with Friends and Family: Not on file    Attends Religious Services: Not on file    Active Member of Clubs or Organizations: Not on file    Attends Archivist Meetings: Not on file    Marital Status: Not on file    Review of Systems: A 12 point ROS discussed and pertinent positives are indicated in the HPI above.  All other systems are negative.  Review of  Systems  Constitutional:  Negative for appetite change and fatigue.  Respiratory:  Negative for cough and shortness of breath.   Cardiovascular:  Negative for chest pain and leg swelling.  Gastrointestinal:  Negative for abdominal pain, diarrhea, nausea and vomiting.  Neurological:  Negative for dizziness, weakness and headaches.    Vital Signs: BP (!) 100/53   Pulse (!) 55   Temp (!) 97 F (36.1 C) (Temporal)   Resp 16   Ht '5\' 4"'$  (1.626 m)   Wt 110 lb (49.9 kg)   SpO2 98%   BMI 18.88 kg/m   Physical Exam Constitutional:      General: She is not in acute distress.    Appearance: She is not ill-appearing.  HENT:     Mouth/Throat:     Mouth: Mucous membranes are moist.     Pharynx: Oropharynx is clear.  Cardiovascular:     Rate and Rhythm: Normal rate and regular rhythm.     Pulses: Normal pulses.     Heart sounds: Normal heart sounds.  Pulmonary:     Effort: Pulmonary effort is normal.     Breath sounds: Normal breath sounds.  Abdominal:     General: Bowel sounds are normal.     Palpations: Abdomen is soft.     Tenderness: There is no abdominal tenderness.  Musculoskeletal:     Right lower leg: No edema.     Left lower leg: No edema.  Skin:    General: Skin is warm and dry.  Neurological:     Mental Status: She is alert and oriented to person,  place, and time.     Motor: No weakness.  Psychiatric:        Mood and Affect: Mood normal.        Behavior: Behavior normal.        Thought Content: Thought content normal.        Judgment: Judgment normal.     Imaging: No results found.  Labs:  CBC: Recent Labs    10/17/21 0838 02/20/22 0904 04/06/22 1503 05/29/22 0655  WBC 4.8 5.3 5.1 5.1  HGB 10.5* 9.5* 8.7 Repeated and verified X2.* 10.8*  HCT 32.2* 29.5* 27.4* 34.4*  PLT 290.0 306.0 270.0 263    COAGS: Recent Labs    10/01/21 0635 05/29/22 0655  INR 1.0 1.0  APTT 23*  --     BMP: Recent Labs    10/01/21 0635 10/17/21 0838 02/20/22 0904 05/29/22 0655  NA 139 141 143 141  K 3.7 3.6 3.8 4.0  CL 105 105 108 108  CO2 '26 29 29 27  '$ GLUCOSE 114* 118* 54* 63*  BUN '13 12 11 16  '$ CALCIUM 8.6* 8.6 7.9* 8.5*  CREATININE 0.87 0.96 0.81 0.83  GFRNONAA >60  --   --  >60    LIVER FUNCTION TESTS: Recent Labs    10/17/21 0838 02/20/22 0904  BILITOT 0.4 0.3  AST 17 18  ALT 11 11  ALKPHOS 74 97  PROT 5.5* 5.3*  ALBUMIN 3.6 3.1*    TUMOR MARKERS: No results for input(s): "AFPTM", "CEA", "CA199", "CHROMGRNA" in the last 8760 hours.  Assessment and Plan:  Anterior communicating artery aneurysm s/p embolization 10/01/21: Ashley Gross, 71 year old female, presents today to the [facility] Neuro Interventional Radiology department for an image-guided diagnostic cerebral angiogram for evaluation of the  previously treated aneurysm.  Risks and benefits of this procedure were discussed with the patient including, but not limited to bleeding, infection, vascular  injury or contrast induced renal failure.  This interventional procedure involves the use of X-rays and because of the nature of the planned procedure, it is possible that we will have prolonged use of X-ray fluoroscopy.  Potential radiation risks to you include (but are not limited to) the following: - A slightly elevated risk for cancer  several  years later in life. This risk is typically less than 0.5% percent. This risk is low in comparison to the normal incidence of human cancer, which is 33% for women and 50% for men according to the [employer]. - Radiation induced injury can include skin redness, resembling a rash, tissue breakdown / ulcers and hair loss (which can be temporary or permanent).   The likelihood of either of these occurring depends on the difficulty of the procedure and whether you are sensitive to radiation due to previous procedures, disease, or genetic conditions.   IF your procedure requires a prolonged use of radiation, you will be notified and given written instructions for further action.  It is your responsibility to monitor the irradiated area for the 2 weeks following the procedure and to notify your physician if you are concerned that you have suffered a radiation induced injury.    All of the patient's questions were answered, patient is agreeable to proceed. She has been NPO.   Consent signed and in chart.   Thank you for this interesting consult.  I greatly enjoyed meeting Ashley Gross and look forward to participating in their care.  A copy of this report was sent to the requesting provider on this date.  Electronically Signed: Soyla Dryer, AGACNP-BC [phone] 05/29/2022, 8:17 AM   I spent a total of  30 Minutes   in face to face in clinical consultation, greater than 50% of which was counseling/coordinating care for diagnostic cerebral angiogram.

## 2022-05-29 ENCOUNTER — Ambulatory Visit (HOSPITAL_COMMUNITY)
Admission: RE | Admit: 2022-05-29 | Discharge: 2022-05-29 | Disposition: A | Discharge status: Home / Self Care | Payer: PPO | Source: Ambulatory Visit | Attending: Neuroradiology | Admitting: Neuroradiology

## 2022-05-29 ENCOUNTER — Other Ambulatory Visit (HOSPITAL_COMMUNITY): Payer: Self-pay | Admitting: Neuroradiology

## 2022-05-29 ENCOUNTER — Other Ambulatory Visit: Payer: Self-pay

## 2022-05-29 DIAGNOSIS — Z8719 Personal history of other diseases of the digestive system: Secondary | ICD-10-CM | POA: Insufficient documentation

## 2022-05-29 DIAGNOSIS — I671 Cerebral aneurysm, nonruptured: Secondary | ICD-10-CM | POA: Insufficient documentation

## 2022-05-29 DIAGNOSIS — Z8774 Personal history of (corrected) congenital malformations of heart and circulatory system: Secondary | ICD-10-CM | POA: Diagnosis not present

## 2022-05-29 DIAGNOSIS — J45909 Unspecified asthma, uncomplicated: Secondary | ICD-10-CM | POA: Insufficient documentation

## 2022-05-29 DIAGNOSIS — Z794 Long term (current) use of insulin: Secondary | ICD-10-CM | POA: Insufficient documentation

## 2022-05-29 DIAGNOSIS — E119 Type 2 diabetes mellitus without complications: Secondary | ICD-10-CM | POA: Insufficient documentation

## 2022-05-29 DIAGNOSIS — I639 Cerebral infarction, unspecified: Secondary | ICD-10-CM

## 2022-05-29 DIAGNOSIS — I10 Essential (primary) hypertension: Secondary | ICD-10-CM | POA: Insufficient documentation

## 2022-05-29 DIAGNOSIS — I6611 Occlusion and stenosis of right anterior cerebral artery: Secondary | ICD-10-CM | POA: Diagnosis not present

## 2022-05-29 HISTORY — PX: IR ANGIO VERTEBRAL SEL VERTEBRAL UNI R MOD SED: IMG5368

## 2022-05-29 HISTORY — PX: IR US GUIDE VASC ACCESS RIGHT: IMG2390

## 2022-05-29 HISTORY — PX: IR ANGIO INTRA EXTRACRAN SEL INTERNAL CAROTID UNI R MOD SED: IMG5362

## 2022-05-29 HISTORY — DX: Cerebral infarction, unspecified: I63.9

## 2022-05-29 LAB — BASIC METABOLIC PANEL
Anion gap: 6 (ref 5–15)
BUN: 16 mg/dL (ref 8–23)
CO2: 27 mmol/L (ref 22–32)
Calcium: 8.5 mg/dL — ABNORMAL LOW (ref 8.9–10.3)
Chloride: 108 mmol/L (ref 98–111)
Creatinine, Ser: 0.83 mg/dL (ref 0.44–1.00)
GFR, Estimated: >60 mL/min (ref >60)
Glucose, Bld: 63 mg/dL — ABNORMAL LOW (ref 70–99)
Potassium: 4 mmol/L (ref 3.5–5.1)
Sodium: 141 mmol/L (ref 135–145)

## 2022-05-29 LAB — CBC WITH DIFFERENTIAL/PLATELET
Abs Immature Granulocytes: 0 10*3/uL (ref 0.00–0.07)
Basophils Absolute: 0.1 10*3/uL (ref 0.0–0.1)
Basophils Relative: 1 %
Eosinophils Absolute: 0.2 10*3/uL (ref 0.0–0.5)
Eosinophils Relative: 4 %
HCT: 34.4 % — ABNORMAL LOW (ref 36.0–46.0)
Hemoglobin: 10.8 g/dL — ABNORMAL LOW (ref 12.0–15.0)
Lymphocytes Relative: 31 %
Lymphs Abs: 1.6 10*3/uL (ref 0.7–4.0)
MCH: 28.5 pg (ref 26.0–34.0)
MCHC: 31.4 g/dL (ref 30.0–36.0)
MCV: 90.8 fL (ref 80.0–100.0)
Monocytes Absolute: 0.2 10*3/uL (ref 0.1–1.0)
Monocytes Relative: 4 %
Neutro Abs: 3.1 10*3/uL (ref 1.7–7.7)
Neutrophils Relative %: 60 %
Platelets: 263 10*3/uL (ref 150–400)
RBC: 3.79 MIL/uL — ABNORMAL LOW (ref 3.87–5.11)
RDW: 21.7 % — ABNORMAL HIGH (ref 11.5–15.5)
WBC: 5.1 10*3/uL (ref 4.0–10.5)
nRBC: 0 % (ref 0.0–0.2)
nRBC: 0 /100 WBC

## 2022-05-29 LAB — GLUCOSE, CAPILLARY
Glucose-Capillary: 51 mg/dL — ABNORMAL LOW (ref 70–99)
Glucose-Capillary: 177 mg/dL — ABNORMAL HIGH (ref 70–99)

## 2022-05-29 LAB — PROTIME-INR
INR: 1 (ref 0.8–1.2)
Prothrombin Time: 12.7 seconds (ref 11.4–15.2)

## 2022-05-29 MED ORDER — FENTANYL CITRATE (PF) 100 MCG/2ML IJ SOLN
INTRAMUSCULAR | Status: AC
  Filled 2022-05-29: qty 2

## 2022-05-29 MED ORDER — IOHEXOL 300 MG/ML  SOLN
100.0000 mL | Freq: Once | INTRAMUSCULAR | Status: AC | PRN
  Administered 2022-05-29: 50 mL via INTRA_ARTERIAL

## 2022-05-29 MED ORDER — HEPARIN SODIUM (PORCINE) 1000 UNIT/ML IJ SOLN
INTRAMUSCULAR | Status: AC
  Filled 2022-05-29: qty 10

## 2022-05-29 MED ORDER — SODIUM CHLORIDE 0.9 % IV SOLN: INTRAVENOUS | Status: DC

## 2022-05-29 MED ORDER — DEXTROSE 50 % IV SOLN
INTRAVENOUS | Status: AC
  Administered 2022-05-29: 25 g via INTRAVENOUS
  Filled 2022-05-29: qty 50

## 2022-05-29 MED ORDER — FENTANYL CITRATE (PF) 100 MCG/2ML IJ SOLN
INTRAMUSCULAR | Status: AC | PRN
  Administered 2022-05-29 (×2): 25 ug via INTRAVENOUS

## 2022-05-29 MED ORDER — DEXTROSE 50 % IV SOLN: 25.0000 g | INTRAVENOUS | Status: AC

## 2022-05-29 MED ORDER — NITROGLYCERIN 1 MG/10 ML FOR IR/CATH LAB: INTRA_ARTERIAL | Status: AC | PRN

## 2022-05-29 MED ORDER — VERAPAMIL HCL 2.5 MG/ML IV SOLN
INTRAVENOUS | Status: AC
  Filled 2022-05-29: qty 2

## 2022-05-29 MED ORDER — MIDAZOLAM HCL 2 MG/2ML IJ SOLN
INTRAMUSCULAR | Status: AC | PRN
  Administered 2022-05-29: 1 mg via INTRAVENOUS

## 2022-05-29 MED ORDER — MIDAZOLAM HCL 2 MG/2ML IJ SOLN
INTRAMUSCULAR | Status: AC
  Filled 2022-05-29: qty 2

## 2022-05-29 MED ORDER — NITROGLYCERIN 1 MG/10 ML FOR IR/CATH LAB
INTRA_ARTERIAL | Status: AC
  Filled 2022-05-29: qty 10

## 2022-05-29 MED ORDER — LIDOCAINE HCL 1 % IJ SOLN
INTRAMUSCULAR | Status: AC
  Administered 2022-05-29: 10 mL
  Filled 2022-05-29: qty 20

## 2022-05-29 NOTE — Procedures (Signed)
INTERVENTIONAL NEURORADIOLOGY BRIEF POSTPROCEDURE NOTE  DIAGNOSTIC CEREBRAL ANGIOGRAM  Attending: Dr. Pedro Earls  Diagnosis: Acom artery aneurysm status post embolization.  Access site: Distal right radial artery.  Access closure: Inflatable band  Anesthesia: Moderate sedation.  Medication used: 1 mg Versed IV; 50 mcg Fentanyl IV.  Complications: None.  Estimated blood loss: None.  Specimen: none.  Findings: Status post embolization of anterior communicating artery aneurysm. No residual or recurrent aneurysm identified.  The patient tolerated the procedure well without incident or complication and is in stable condition.    PLAN: - No further follow-up catheter angiogram necessary. An MR angiogram will be obtained in 1 year for follow-up.

## 2022-05-29 NOTE — Sedation Documentation (Signed)
Right radial sheath removed and 6 cc air applied to band at 0939

## 2022-05-30 ENCOUNTER — Emergency Department (HOSPITAL_COMMUNITY): Payer: PPO

## 2022-05-30 ENCOUNTER — Observation Stay (HOSPITAL_COMMUNITY)
Admission: EM | Admit: 2022-05-30 | Discharge: 2022-06-01 | Disposition: A | Discharge status: Home / Self Care | Payer: PPO | Attending: Internal Medicine | Admitting: Internal Medicine

## 2022-05-30 ENCOUNTER — Telehealth: Payer: Self-pay | Admitting: Physician Assistant

## 2022-05-30 ENCOUNTER — Encounter (HOSPITAL_COMMUNITY): Payer: Self-pay

## 2022-05-30 ENCOUNTER — Other Ambulatory Visit: Payer: Self-pay

## 2022-05-30 DIAGNOSIS — K219 Gastro-esophageal reflux disease without esophagitis: Secondary | ICD-10-CM | POA: Diagnosis not present

## 2022-05-30 DIAGNOSIS — Z794 Long term (current) use of insulin: Secondary | ICD-10-CM | POA: Diagnosis not present

## 2022-05-30 DIAGNOSIS — I10 Essential (primary) hypertension: Secondary | ICD-10-CM | POA: Diagnosis not present

## 2022-05-30 DIAGNOSIS — I6350 Cerebral infarction due to unspecified occlusion or stenosis of unspecified cerebral artery: Secondary | ICD-10-CM | POA: Diagnosis not present

## 2022-05-30 DIAGNOSIS — Z79899 Other long term (current) drug therapy: Secondary | ICD-10-CM | POA: Diagnosis not present

## 2022-05-30 DIAGNOSIS — I671 Cerebral aneurysm, nonruptured: Secondary | ICD-10-CM | POA: Diagnosis not present

## 2022-05-30 DIAGNOSIS — I639 Cerebral infarction, unspecified: Principal | ICD-10-CM | POA: Insufficient documentation

## 2022-05-30 DIAGNOSIS — Z86711 Personal history of pulmonary embolism: Secondary | ICD-10-CM | POA: Insufficient documentation

## 2022-05-30 DIAGNOSIS — Z8673 Personal history of transient ischemic attack (TIA), and cerebral infarction without residual deficits: Secondary | ICD-10-CM | POA: Diagnosis present

## 2022-05-30 DIAGNOSIS — E119 Type 2 diabetes mellitus without complications: Secondary | ICD-10-CM | POA: Diagnosis not present

## 2022-05-30 DIAGNOSIS — I6349 Cerebral infarction due to embolism of other cerebral artery: Secondary | ICD-10-CM

## 2022-05-30 DIAGNOSIS — E118 Type 2 diabetes mellitus with unspecified complications: Secondary | ICD-10-CM | POA: Diagnosis not present

## 2022-05-30 DIAGNOSIS — R531 Weakness: Secondary | ICD-10-CM | POA: Diagnosis present

## 2022-05-30 DIAGNOSIS — J45909 Unspecified asthma, uncomplicated: Secondary | ICD-10-CM | POA: Insufficient documentation

## 2022-05-30 DIAGNOSIS — Z20822 Contact with and (suspected) exposure to covid-19: Secondary | ICD-10-CM | POA: Insufficient documentation

## 2022-05-30 DIAGNOSIS — E7800 Pure hypercholesterolemia, unspecified: Secondary | ICD-10-CM | POA: Diagnosis present

## 2022-05-30 DIAGNOSIS — R29810 Facial weakness: Secondary | ICD-10-CM | POA: Diagnosis not present

## 2022-05-30 DIAGNOSIS — E1165 Type 2 diabetes mellitus with hyperglycemia: Secondary | ICD-10-CM

## 2022-05-30 LAB — I-STAT CHEM 8, ED
BUN: 18 mg/dL (ref 8–23)
Calcium, Ion: 1.09 mmol/L — ABNORMAL LOW (ref 1.15–1.40)
Chloride: 107 mmol/L (ref 98–111)
Creatinine, Ser: 0.8 mg/dL (ref 0.44–1.00)
Glucose, Bld: 180 mg/dL — ABNORMAL HIGH (ref 70–99)
HCT: 33 % — ABNORMAL LOW (ref 36.0–46.0)
Hemoglobin: 11.2 g/dL — ABNORMAL LOW (ref 12.0–15.0)
Potassium: 3.8 mmol/L (ref 3.5–5.1)
Sodium: 142 mmol/L (ref 135–145)
TCO2: 22 mmol/L (ref 22–32)

## 2022-05-30 LAB — DIFFERENTIAL
Abs Immature Granulocytes: 0.02 10*3/uL (ref 0.00–0.07)
Basophils Absolute: 0 10*3/uL (ref 0.0–0.1)
Basophils Relative: 1 %
Eosinophils Absolute: 0.1 10*3/uL (ref 0.0–0.5)
Eosinophils Relative: 1 %
Immature Granulocytes: 0 %
Lymphocytes Relative: 21 %
Lymphs Abs: 1.4 10*3/uL (ref 0.7–4.0)
Monocytes Absolute: 0.4 10*3/uL (ref 0.1–1.0)
Monocytes Relative: 5 %
Neutro Abs: 5 10*3/uL (ref 1.7–7.7)
Neutrophils Relative %: 72 %

## 2022-05-30 LAB — COMPREHENSIVE METABOLIC PANEL
ALT: 23 U/L (ref 0–44)
AST: 28 U/L (ref 15–41)
Albumin: 3 g/dL — ABNORMAL LOW (ref 3.5–5.0)
Alkaline Phosphatase: 78 U/L (ref 38–126)
Anion gap: 6 (ref 5–15)
BUN: 17 mg/dL (ref 8–23)
CO2: 24 mmol/L (ref 22–32)
Calcium: 8.2 mg/dL — ABNORMAL LOW (ref 8.9–10.3)
Chloride: 111 mmol/L (ref 98–111)
Creatinine, Ser: 0.88 mg/dL (ref 0.44–1.00)
GFR, Estimated: >60 mL/min (ref >60)
Glucose, Bld: 190 mg/dL — ABNORMAL HIGH (ref 70–99)
Potassium: 3.8 mmol/L (ref 3.5–5.1)
Sodium: 141 mmol/L (ref 135–145)
Total Bilirubin: 0.2 mg/dL — ABNORMAL LOW (ref 0.3–1.2)
Total Protein: 5.2 g/dL — ABNORMAL LOW (ref 6.5–8.1)

## 2022-05-30 LAB — CBC
HCT: 33.1 % — ABNORMAL LOW (ref 36.0–46.0)
Hemoglobin: 10.6 g/dL — ABNORMAL LOW (ref 12.0–15.0)
MCH: 29 pg (ref 26.0–34.0)
MCHC: 32 g/dL (ref 30.0–36.0)
MCV: 90.4 fL (ref 80.0–100.0)
Platelets: 258 10*3/uL (ref 150–400)
RBC: 3.66 MIL/uL — ABNORMAL LOW (ref 3.87–5.11)
RDW: 21.6 % — ABNORMAL HIGH (ref 11.5–15.5)
WBC: 6.9 10*3/uL (ref 4.0–10.5)
nRBC: 0 % (ref 0.0–0.2)

## 2022-05-30 LAB — RESP PANEL BY RT-PCR (FLU A&B, COVID) ARPGX2
Influenza A by PCR: NEGATIVE
Influenza B by PCR: NEGATIVE
SARS Coronavirus 2 by RT PCR: NEGATIVE

## 2022-05-30 LAB — PROTIME-INR
INR: 1 (ref 0.8–1.2)
Prothrombin Time: 13.5 seconds (ref 11.4–15.2)

## 2022-05-30 LAB — APTT: aPTT: 25 seconds (ref 24–36)

## 2022-05-30 LAB — CBG MONITORING, ED: Glucose-Capillary: 174 mg/dL — ABNORMAL HIGH (ref 70–99)

## 2022-05-30 LAB — ETHANOL: Alcohol, Ethyl (B): <10 mg/dL (ref <10)

## 2022-05-30 MED ORDER — ACETAMINOPHEN 500 MG PO TABS: 1000.0000 mg | ORAL_TABLET | Freq: Four times a day (QID) | ORAL | Status: DC | PRN

## 2022-05-30 MED ORDER — ASPIRIN 325 MG PO TABS
325.0000 mg | ORAL_TABLET | Freq: Every day | ORAL | Status: DC
  Filled 2022-05-30: qty 1

## 2022-05-30 MED ORDER — ENOXAPARIN SODIUM 40 MG/0.4ML IJ SOSY
40.0000 mg | PREFILLED_SYRINGE | INTRAMUSCULAR | Status: DC
Start: 2022-05-30 — End: 2022-06-01
  Administered 2022-05-30 – 2022-05-31 (×2): 40 mg via SUBCUTANEOUS
  Filled 2022-05-30 (×2): qty 0.4

## 2022-05-30 MED ORDER — STROKE: EARLY STAGES OF RECOVERY BOOK
Freq: Once | Status: AC
Start: 2022-05-31 — End: 2022-05-31
  Filled 2022-05-30 (×2): qty 1

## 2022-05-30 MED ORDER — GABAPENTIN 400 MG PO CAPS
400.0000 mg | ORAL_CAPSULE | Freq: Every day | ORAL | Status: DC
  Administered 2022-05-31 (×2): 400 mg via ORAL
  Filled 2022-05-30 (×2): qty 1

## 2022-05-30 MED ORDER — VITAMIN D 25 MCG (1000 UNIT) PO TABS
2000.0000 [IU] | ORAL_TABLET | Freq: Every day | ORAL | Status: DC
  Administered 2022-05-31 – 2022-06-01 (×2): 2000 [IU] via ORAL
  Filled 2022-05-30 (×2): qty 2

## 2022-05-30 MED ORDER — ROSUVASTATIN CALCIUM 5 MG PO TABS
10.0000 mg | ORAL_TABLET | Freq: Every day | ORAL | Status: DC
  Administered 2022-05-31 – 2022-06-01 (×2): 10 mg via ORAL
  Filled 2022-05-30 (×2): qty 2

## 2022-05-30 MED ORDER — PANTOPRAZOLE SODIUM 40 MG PO TBEC
40.0000 mg | DELAYED_RELEASE_TABLET | Freq: Every day | ORAL | Status: DC
  Administered 2022-05-31 – 2022-06-01 (×2): 40 mg via ORAL
  Filled 2022-05-30 (×2): qty 1

## 2022-05-30 MED ORDER — MELATONIN 5 MG PO TABS
10.0000 mg | ORAL_TABLET | Freq: Every day | ORAL | Status: DC
  Administered 2022-05-31 (×2): 10 mg via ORAL
  Filled 2022-05-30 (×2): qty 2

## 2022-05-30 MED ORDER — GADOBUTROL 1 MMOL/ML IV SOLN
4.9000 mL | Freq: Once | INTRAVENOUS | Status: AC | PRN
  Administered 2022-05-30: 4.9 mL via INTRAVENOUS

## 2022-05-30 NOTE — ED Triage Notes (Signed)
Pt coming from home via [facility]. Pt had a procedure yesterday and was noted to have issues walking and talking afterwards but was sent home afterwards. Pt has left sided facial droop and left arm drip. Pt reports a severe headache yesterday afternoon

## 2022-05-30 NOTE — H&P (Signed)
History and Physical    Patient: Ashley Gross NAT:[id number] DOB: [birth year] DOA: 05/30/2022 DOS: the patient was seen and examined on 05/31/2022 PCP: Einar Pheasant, MD  Patient coming from: Home  Chief Complaint:  Chief Complaint  Patient presents with   Weakness   Stroke Symptoms   HPI: Ashley Gross is a 71 y.o. 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   Pt underwent routine diagnostic cerebral angiogram of previous embolized anterior communicating artery on 7/28.  Shortly after the procedure she had brief double vision, ongoing home she had difficulty with her gait and was stumbling when she was previously walking independently.  Also was having slurred speech.  They discussed with RN and was told it was likely due to medication for the procedure.  However, today she continued to have slurred speech and began to notice left facial droop.  Family then concerned about stroke given brought her here to [facility].  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 Also additional punctate foci of acute infarct in the right cerebellum and right greater than left occipital lobes. MRA head and neck were negative for acute findings.   No leukocytosis. Anemia around baseline at 10.6. No significant electrolyte abnormalities.  She was given '325mg'$  aspirin and neurology Dr. Leonel Ramsay was consulted.  Hospitalist was then called for admission for further stroke work-up.   Review of Systems: As mentioned in the history of present illness. All other systems reviewed and are negative. Past Medical History:  Diagnosis Date   Abnormal liver function test  10/06/2012   Anemia, unspecified    Asthma    B12 deficiency 02/11/2019   Bleeding internal hemorrhoids 09/13/2017   Choledocholithiasis 06/29/2012   Formatting of this note might be different from the original. Dilated CBD on abdominal imaging 04/2012   Chronic diarrhea 02/16/2013   Colitis    Complication of anesthesia    asthma attack after shoulder surgery   Diabetes mellitus ([place])    type II   Esophagitis    GERD (gastroesophageal reflux disease)    Heart murmur    for years- nothing to be concerned about.   History of kidney stones    Hx of arteriovenous malformation (AVM)    Hypertension    IBS (irritable bowel syndrome)    Pernicious anemia    Personal history of colonic polyps 02/10/2013   02/08/13 colonoscopy - question of rectal varices, two 3-38m polyps in the sigmoid colon, mass at the hepatic flexure, one 53mpolyp at the hepatic flexure, nodule at the ileocecal valve, congested mucusa in the entire colon, flattened villi mucosa in the terminal ileum.     Pneumonia    [place]oriatic arthritis [place]   s/p penicillamine, plaquenil, MTX, sulfasalazine.  s/p Embrel, Humira,.  Iritis.     Pure hypercholesterolemia    Rectal bleeding 07/15/2017   Past Surgical History:  Procedure Laterality Date   ABDOMINAL HYSTERECTOMY  1981   ovaries left in place   ANKLE SURGERY     right   BUNIONECTOMY     CHOLECYSTECTOMY  1989   COLONOSCOPY WITH PROPOFOL N/A 12/27/2017   Procedure: COLONOSCOPY WITH PROPOFOL;  Surgeon: VaLin LandsmanMD;  [facility];  Service: Gastroenterology;  Laterality: N/A;   COLONOSCOPY WITH  PROPOFOL N/A 01/04/2019   Procedure: COLONOSCOPY WITH PROPOFOL;  Surgeon: Lin Landsman, MD;  Location: [place];  Service: Endoscopy;  Laterality: N/A;  Diabetic - oral and injectable   COLONOSCOPY WITH PROPOFOL N/A 07/24/2021   Procedure: COLONOSCOPY WITH PROPOFOL;  Surgeon: Lin Landsman, MD;  Location: [place];  Service:  Endoscopy;  Laterality: N/A;  Diabetic   ESOPHAGOGASTRODUODENOSCOPY (EGD) WITH PROPOFOL N/A 12/27/2017   Procedure: ESOPHAGOGASTRODUODENOSCOPY (EGD) WITH PROPOFOL;  Surgeon: Lin Landsman, MD;  Location: [facility] END[facility]COPY;  Service: Gastroenterology;  Laterality: N/A;   HAND SURGERY     right   HEEL SPUR SURGERY     IR 3D INDEPENDENT WKST  10/01/2021   IR ANGIO INTRA EXTRACRAN SEL COM CAROTID INNOMINATE UNI L MOD SED  10/01/2021   IR ANGIO INTRA EXTRACRAN SEL INTERNAL CAROTID UNI R MOD SED  10/01/2021   IR ANGIO INTRA EXTRACRAN SEL INTERNAL CAROTID UNI R MOD SED  05/29/2022   IR ANGIO VERTEBRAL SEL VERTEBRAL BILAT MOD SED  10/01/2021   IR ANGIO VERTEBRAL SEL VERTEBRAL UNI R MOD SED  05/29/2022   IR CT HEAD LTD  10/01/2021   IR TRANSCATH/EMBOLIZ  10/01/2021   IR US GUIDE VASC ACCESS RIGHT  10/01/2021   IR US GUIDE VASC ACCESS RIGHT  05/29/2022   NOSE SURGERY     x2   POLYPECTOMY  01/04/2019   Procedure: POLYPECTOMY;  Surgeon: Lin Landsman, MD;  Location: [place];  Service: Endoscopy;;   POLYPECTOMY N/A 07/24/2021   Procedure: POLYPECTOMY;  Surgeon: Lin Landsman, MD;  Location: [place];  Service: Endoscopy;  Laterality: N/A;   RADIOLOGY WITH ANESTHESIA N/A 10/01/2021   Procedure: Starr Sinclair WITH ANESTHESIA;  Surgeon: Pedro Earls, MD;  Location: [place];  Service: Radiology;  Laterality: N/A;   SHOULDER ARTHROSCOPY WITH OPEN ROTATOR CUFF REPAIR Left 09/20/2018   Procedure: SHOULDER ARTHROSCOPY WITH OPEN ROTATOR CUFF REPAIR;  Surgeon: Corky Mull, MD;  Location: [facility];  Service: Orthopedics;  Laterality: Left;   SHOULDER CLOSED REDUCTION Left 12/21/2018   Procedure: MANIPULATION UNDER ANESTHESIA WITH STEROID INJECTION;  Surgeon: Corky Mull, MD;  Location: [place];  Service: Orthopedics;  Laterality: Left;  Diabetic - insulin and oral meds   UMBILICAL HERNIA REPAIR     XI ROBOTIC ASSISTED INGUINAL HERNIA REPAIR WITH MESH  Left 12/16/2021   Procedure: XI ROBOTIC ASSISTED INGUINAL HERNIA REPAIR WITH MESH;  Surgeon: Olean Ree, MD;  Location: [facility];  Service: General;  Laterality: Left;   Social History:  reports that she has never smoked. She has never used smokeless tobacco. She reports that she does not drink alcohol and does not use drugs. Patient is very active.  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  Allergies  Allergen Reactions   Aspirin Other (See Comments)    Increased bleeding   Beta Adrenergic Blockers     3rd degree blockage    Lisinopril Cough   Mtx Support [Cobalamine Combinations] Other (See Comments)    Respiratory symptoms   Vasotec [Enalapril] Cough   Verapamil Other (See Comments)    Heart block   Voltaren [Diclofenac Sodium]     Pt unsure of reaction    Erythromycin Other (See Comments)    Gi intolerance   Indocin [Indomethacin]     Upset stomach   Jardiance [Empagliflozin] Other (See Comments)    Recurrent yeast infections, even with washout and  rechallenge    Family History  Problem Relation Age of Onset   Diabetes Other    Hypertension Other    Breast cancer Neg Hx    Colon cancer Neg Hx     Prior to Admission medications   Medication Sig Start Date End Date Taking? Authorizing Provider  acetaminophen (TYLENOL) 500 MG tablet Take 2 tablets (1,000 mg total) by mouth every 6 (six) hours as needed for mild pain. 12/16/21  Yes Piscoya, Jacqulyn Bath, MD  albuterol (PROVENTIL) (2.5 MG/3ML) 0.083% nebulizer solution Take 3 mLs (2.5 mg total) by nebulization every 4 (four) hours as needed for wheezing or shortness of breath. 04/02/21  Yes McLean-Scocuzza, Nino Glow, MD  albuterol (VENTOLIN HFA) 108 (90 Base) MCG/ACT inhaler Inhale 2 puffs into the lungs every 6 (six) hours as needed for wheezing or shortness of breath.   Yes [provider]  amLODipine (NORVASC) 5 MG tablet TAKE 1 TABLET(5 MG) BY MOUTH TWICE DAILY 08/26/21  Yes Einar Pheasant, MD   budesonide-formoterol [facility]) 160-4.5 MCG/ACT inhaler Inhale 2 puffs into the lungs 2 (two) times daily. Patient taking differently: Inhale 2 puffs into the lungs 2 (two) times daily as needed (shortness of breath). 11/05/21  Yes Einar Pheasant, MD  Cholecalciferol (VITAMIN D) 50 MCG (2000 UT) tablet Take 2,000 Units by mouth daily.   Yes [provider]  diphenhydramine-acetaminophen (TYLENOL PM) 25-500 MG TABS tablet Take 2 tablets by mouth at bedtime as needed (sleep).   Yes [provider]  fluticasone (FLOVENT HFA) 110 MCG/ACT inhaler Inhale 2 puffs into the lungs 2 (two) times daily as needed (shortness of breath).   Yes [provider]  gabapentin (NEURONTIN) 100 MG capsule Take 400 mg by mouth at bedtime. 01/12/22  Yes [provider]  insulin degludec (TRESIBA FLEXTOUCH) 100 UNIT/ML FlexTouch Pen Inject 14 Units into the skin daily. Patient taking differently: Inject 12 Units into the skin daily with supper. 11/05/21  Yes Einar Pheasant, MD  loperamide (IMODIUM) 2 MG capsule Take 4 mg by mouth daily.   Yes [provider]  losartan (COZAAR) 100 MG tablet Take 1 tablet (100 mg total) by mouth at bedtime. 01/29/22  Yes Einar Pheasant, MD  Melatonin 10 MG TABS Take 10 mg by mouth at bedtime.   Yes [provider]  omeprazole (PRILOSEC) 20 MG capsule TAKE 1 CAPSULE(20 MG) BY MOUTH TWICE DAILY 06/27/21  Yes Einar Pheasant, MD  rosuvastatin (CRESTOR) 10 MG tablet TAKE 1 TABLET(10 MG) BY MOUTH DAILY 03/26/22  Yes Einar Pheasant, MD  ONE TOUCH ULTRA TEST test strip PATIENT NEEDS NEW METER STRIPS AND LANCETS FOR ONE TOUCH. HER INSURANCE NO LONGER COVERS [employer]. 07/31/15   Einar Pheasant, MD    Physical Exam: Vitals:   05/30/22 2030 05/30/22 2145 05/30/22 2215 05/30/22 2300  BP: (!) 159/61 139/69 (!) 148/72   Pulse: 64 (!) 55 (!) 55   Resp: '13 15 13   '$ Temp: 98.7 F (37.1 C)     TempSrc: Oral     SpO2: 100% 98% 97%   Weight:    51.1  kg  Height:    '5\' 4"'$  (1.626 m)   Constitutional: NAD, calm, comfortable, elderly female laying upright in bed Eyes:lids and conjunctivae normal ENMT: Mucous membranes are moist.  Neck: normal, supple Respiratory: clear to auscultation bilaterally, no wheezing, no crackles. Normal respiratory effort. No accessory muscle use.  Cardiovascular: Regular rate and rhythm, no murmurs / rubs / gallops. No extremity edema.  Abdomen: Soft, nontender  nondistended.  Bowel sounds positive.  Musculoskeletal: no clubbing / cyanosis. No joint deformity upper and lower extremities. Good ROM, no contractures. Normal muscle tone. Has right UE splint from recent IR procedure Skin: no rashes, lesions, ulcers.  Neurologic: CN 2-12 grossly intact. Has facial droop.  Difficulty with left shoulder shrug.  Sensation intact,Strength 5/5 in all 4.  No obvious slurred dysarthria. Psychiatric: Normal judgment and insight. Alert and oriented x 3. Normal mood. Data Reviewed:  See HPI  Assessment and Plan: * Stroke [facility]) Embolic stroke following diagnostic cerebral angio for known anterior communicating artery aneurysm s/p embolization -MRA head and neck were negative -Pt has hx of iron deficiency anemia. She has inflammatory polyposis in the colon with rectal bleeding the past that have resolved with removal of the polyps.  However rectal bleeding has returned and she is followed by GI Dr. Marius Ditch at [facility]. She is in the process of being started on a biologic vedolizumab Weyman Rodney) for this.  She is also being followed by hematology and his recently required several doses of IV iron infusion. -Hemoglobin is currently stable at 10.6 and this is back to her previous baseline after it had downward trended to 8.7  a month ago before her iron treatments.  -Given these issues, she is hesitant to start aspirin. 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  Has 1 year MRI angiogram follow-up.   Hypercholesterolemia Continue statin.  GERD (gastroesophageal reflux disease) Continue home omeprazole  Hypertension Allowing for primary hypertension      Advance Care Planning:   Code Status: Full Code full  Consults: Neurology  Family Communication: Discussed with daughter at bedside  Severity of Illness: The appropriate patient status for this patient is OBSERVATION. Observation status is judged to be reasonable and necessary in order to provide the required intensity of service to ensure the patient's safety. The patient's presenting symptoms, physical exam findings, and initial radiographic and laboratory data in the context of their medical condition is felt to place them at decreased risk for further clinical deterioration. Furthermore, it is anticipated that the patient will be medically stable for discharge from the hospital within 2 midnights of admission.   Author: Orene Desanctis, DO 05/31/2022 2:48 AM  For on call review [email or web address].

## 2022-05-30 NOTE — Assessment & Plan Note (Signed)
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  Has 1 year MRI angiogram follow-up.

## 2022-05-30 NOTE — Consult Note (Signed)
NEURO HOSPITALIST CONSULT NOTE   Requestig physician: Dr. Flossie Buffy  Reason for Consult: Post-procedural ischemic infarctions  History obtained from: Patient and Chart     HPI:                                                                                                                                          Ashley Gross is a 71 y.o. female with a PMHx of DM2, colitis, proriatic arthritis, asthma, HTN, anemia, left inguinal hernia s/p repair 12/16/21, ACOM aneurysm embolization in 2022 and arteriovenous malformation. 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 She was referred to [facility] for management/treatment options and underwent a diagnostic cerebral angiogram with endovascular aneurysm embolization 10/01/21. Subsequently, she developed persistent headaches without focal neurological deficit. Imaging was negative for any acute findings and the headaches were attributed to local inflammatory changes related to the treated aneurysm. She was seen yesterday (05/29/22) by [facility] for a routine diagnostic cerebral angiogram to evaluate the embolized anterior communicating artery aneurysm, which was negative for any residual or recurrent aneurysm. She was discharged home on Friday afternoon and was asymptomatic at that time.   This morning, her daughter called Radiology stating that the patient was having new neurologic symptoms of gait unsteadiness and slurred speech, which had started last night and which at that time they had attributed to residual effects of the sedating medications that were given for the procedure. However, this morning the patient's speech was more slurred and she was having more difficulty walking, in addition to possible new left sided facial droop. EMS was called by the Radiology provider so that she could be seen in the ED immediately for  further evaluation. On arrival to the ED initial evaluations noted left facial droop, left arm drift and LLE weakness. The patient endorsed subtle facial droop, slurred speech, LUE and LLE weakness, intermittent diplopia, fatigue and dizziness.   Labs revealed normal APTT/PT/INRl. WBC was normal. Hemoglobin was 10.6 consistent with the patient's baseline.  No electrolyte abnormality noted.  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   MRA head: Normal  MRA neck: Normal MRA of the neck. No acute or focal abnormality to explain brainstem infarct.  Past Medical History:  Diagnosis Date   Abnormal liver function test 10/06/2012   Anemia, unspecified    Asthma    B12 deficiency 02/11/2019   Bleeding internal hemorrhoids 09/13/2017   Choledocholithiasis 06/29/2012   Formatting of this note might be different from the original. Dilated CBD on abdominal imaging 04/2012   Chronic  diarrhea 02/16/2013   Colitis    Complication of anesthesia    asthma attack after shoulder surgery   Diabetes mellitus ([place])    type II   Esophagitis    GERD (gastroesophageal reflux disease)    Heart murmur    for years- nothing to be concerned about.   History of kidney stones    Hx of arteriovenous malformation (AVM)    Hypertension    IBS (irritable bowel syndrome)    Pernicious anemia    Personal history of colonic polyps 02/10/2013   02/08/13 colonoscopy - question of rectal varices, two 3-27m polyps in the sigmoid colon, mass at the hepatic flexure, one 550mpolyp at the hepatic flexure, nodule at the ileocecal valve, congested mucusa in the entire colon, flattened villi mucosa in the terminal ileum.     Pneumonia    Psoriatic arthritis ([place]   s/p penicillamine, plaquenil, MTX, sulfasalazine.  s/p Embrel, Humira,.  Iritis.     Pure  hypercholesterolemia    Rectal bleeding 07/15/2017    Past Surgical History:  Procedure Laterality Date   ABDOMINAL HYSTERECTOMY  1981   ovaries left in place   ANKLE SURGERY     right   BUNIONECTOMY     CHOLECYSTECTOMY  1989   COLONOSCOPY WITH PROPOFOL N/A 12/27/2017   Procedure: COLONOSCOPY WITH PROPOFOL;  Surgeon: VaLin LandsmanMD;  [facility];  Service: Gastroenterology;  Laterality: N/A;   COLONOSCOPY WITH PROPOFOL N/A 01/04/2019   Procedure: COLONOSCOPY WITH PROPOFOL;  Surgeon: VaLin LandsmanMD;  [place] [facility]: [place];  Laterality: N/A;  Diabetic - oral and injectable   COLONOSCOPY WITH PROPOFOL N/A 07/24/2021   Procedure: COLONOSCOPY WITH PROPOFOL;  Surgeon: VaLin LandsmanMD;  [place] [facility]: Endoscopy;  Laterality: N/A;  Diabetic   ESOPHAGOGASTRODUODENOSCOPY (EGD) WITH PROPOFOL N/A 12/27/2017   Procedure: ESOPHAGOGASTRODUODENOSCOPY (EGD) WITH PROPOFOL;  Surgeon: VaLin LandsmanMD;  [facility];  Service: Gastroenterology;  Laterality: N/A;   HAND SURGERY     right   HEEL SPUR SURGERY     IR 3D INDEPENDENT WKST  10/01/2021   IR ANGIO INTRA EXTRACRAN SEL COM CAROTID INNOMINATE UNI L MOD SED  10/01/2021   IR ANGIO INTRA EXTRACRAN SEL INTERNAL CAROTID UNI R MOD SED  10/01/2021   IR ANGIO INTRA EXTRACRAN SEL INTERNAL CAROTID UNI R MOD SED  05/29/2022   IR ANGIO VERTEBRAL SEL VERTEBRAL BILAT MOD SED  10/01/2021   IR ANGIO VERTEBRAL SEL VERTEBRAL UNI R MOD SED  05/29/2022   IR CT HEAD LTD  10/01/2021   IR TRANSCATH/EMBOLIZ  10/01/2021   IR [place] VASC ACCESS RIGHT  10/01/2021   IR [place] VASC ACCESS RIGHT  05/29/2022   NOSE SURGERY     x2   POLYPECTOMY  01/04/2019   Procedure: POLYPECTOMY;  Surgeon: VaLin LandsmanMD;  [place];;   POLYPECTOMY N/A 07/24/2021   Procedure: POLYPECTOMY;  Surgeon: VaLin LandsmanMD;  [place]  [facility] [facility]: [place];  Laterality: N/A;   RADIOLOGY WITH ANESTHESIA N/A 10/01/2021   Procedure: EM[facility] Sinclair[facility] ANESTHESIA;  Surgeon: dePedro EarlsMD;  [facility]: Radiology;  Laterality: N/A;   SHOULDER ARTHROSCOPY WITH OPEN ROTATOR CUFF REPAIR Left 09/20/2018   Procedure: SHOULDER ARTHROSCOPY WITH OPEN ROTATOR CUFF REPAIR;  Surgeon: PoCorky MullMD;  Location: [facility];  Service: Orthopedics;  Laterality:  Left;   SHOULDER CLOSED REDUCTION Left 12/21/2018   Procedure: MANIPULATION UNDER ANESTHESIA WITH STEROID INJECTION;  Surgeon: Corky Mull, MD;  Location: [place];  Service: Orthopedics;  Laterality: Left;  Diabetic - insulin and oral meds   UMBILICAL HERNIA REPAIR     XI ROBOTIC ASSISTED INGUINAL HERNIA REPAIR WITH MESH Left 12/16/2021   Procedure: XI ROBOTIC ASSISTED INGUINAL HERNIA REPAIR WITH MESH;  Surgeon: Olean Ree, MD;  Location: [facility];  Service: General;  Laterality: Left;    Family History  Problem Relation Age of Onset   Diabetes Other    Hypertension Other    Breast cancer Neg Hx    Colon cancer Neg Hx            Social History:  reports that she has never smoked. She has never used smokeless tobacco. She reports that she does not drink alcohol and does not use drugs.  Allergies  Allergen Reactions   Aspirin Other (See Comments)    Increased bleeding   Beta Adrenergic Blockers     3rd degree blockage    Lisinopril Cough   Mtx Support [Cobalamine Combinations] Other (See Comments)    Respiratory symptoms   Vasotec [Enalapril] Cough   Verapamil Other (See Comments)    Heart block   Voltaren [Diclofenac Sodium]     Pt unsure of reaction    Erythromycin Other (See Comments)    Gi intolerance   Indocin [Indomethacin]     Upset stomach   Jardiance [Empagliflozin] Other (See Comments)    Recurrent yeast infections, even with washout and rechallenge    MEDICATIONS:                                                                                                                      No current facility-administered medications on file prior to encounter.   Current Outpatient Medications on File Prior to Encounter  Medication Sig Dispense Refill   acetaminophen (TYLENOL) 500 MG tablet Take 2 tablets (1,000 mg total) by mouth every 6 (six) hours as needed for mild pain.     albuterol (PROVENTIL) (2.5 MG/3ML) 0.083% nebulizer solution Take 3 mLs (2.5 mg total) by nebulization every 4 (four) hours as needed for wheezing or shortness of breath. 360 mL 12   albuterol (VENTOLIN HFA) 108 (90 Base) MCG/ACT inhaler Inhale 2 puffs into the lungs every 6 (six) hours as needed for wheezing or shortness of breath.     amLODipine (NORVASC) 5 MG tablet TAKE 1 TABLET(5 MG) BY MOUTH TWICE DAILY 180 tablet 1   budesonide-formoterol (SYMBICORT) 160-4.5 MCG/ACT inhaler Inhale 2 puffs into the lungs 2 (two) times daily. (Patient taking differently: Inhale 2 puffs into the lungs 2 (two) times daily as needed (shortness of breath).) 3 each 3   Cholecalciferol (VITAMIN D) 50 MCG (2000 UT) tablet Take 2,000 Units by mouth daily.     diphenhydramine-acetaminophen (TYLENOL PM) 25-500 MG TABS tablet Take 2 tablets by mouth at bedtime as needed (  sleep).     fluticasone (FLOVENT HFA) 110 MCG/ACT inhaler Inhale 2 puffs into the lungs 2 (two) times daily as needed (shortness of breath).     gabapentin (NEURONTIN) 100 MG capsule Take 400 mg by mouth at bedtime.     insulin degludec (TRESIBA FLEXTOUCH) 100 UNIT/ML FlexTouch Pen Inject 14 Units into the skin daily. (Patient taking differently: Inject 12 Units into the skin daily with supper.) 12 mL 2   loperamide (IMODIUM) 2 MG capsule Take 4 mg by mouth daily.     losartan (COZAAR) 100 MG tablet Take 1 tablet (100 mg total) by mouth at bedtime. 90 tablet 1   Melatonin 10 MG TABS Take 10 mg by mouth at bedtime.     omeprazole (PRILOSEC) 20 MG capsule TAKE 1 CAPSULE(20 MG)  BY MOUTH TWICE DAILY 180 capsule 3   rosuvastatin (CRESTOR) 10 MG tablet TAKE 1 TABLET(10 MG) BY MOUTH DAILY 90 tablet 3   ONE TOUCH ULTRA TEST test strip PATIENT NEEDS NEW METER STRIPS AND LANCETS FOR ONE TOUCH. HER INSURANCE NO LONGER COVERS ACCUCHEK. 100 each PRN    ROS:                                                                                                                                       As per HPI.    Blood pressure (!) 162/127, pulse 62, temperature 97.7 F (36.5 C), resp. rate (!) 26, SpO2 100 %.   General Examination:                                                                                                       Physical Exam  HEENT-  [place]/AT   Lungs-Respirations unlabored   Extremities- No edema  Neurological Examination Mental Status: Alert, oriented x 5, thought content appropriate. Dysarthria is noted; speech otherwise is fluent without evidence of aphasia. Able to follow all commands without difficulty. Cranial Nerves: II: Temporal visual fields intact with no extinction to DSS. PERRL  III,IV, VI: No ptosis. EOMI with saccadic quality of visual pursuits noted.  V: Temp sensation equal bilaterally  VII: Left facial droop VIII: Hearing intact to voice IX,X: No hoarseness or hypophonia XI: Symmetric shoulder shrug XII: Midline tongue extension Motor: RUE and RLE 5/5 LUE 4/5 proximally and distally LLE 4-/5 proximally and distally  Sensory: Temp and light touch intact throughout, bilaterally. No extinction to DSS.  Deep Tendon Reflexes: 2+ bilateral brachioradialis and patellae. Toes mute bilaterally  Cerebellar: Right  side is normal. LUE with ataxia on FNF. LLE with dysmetric foot-tapping.   Gait: Deferred   Lab Results: Basic Metabolic Panel: Recent Labs  Lab 05/29/22 0655 05/30/22 1241 05/30/22 1249  NA 141 141 142  K 4.0 3.8 3.8  CL 108 111 107  CO2 27 24  --   GLUCOSE 63* 190* 180*  BUN '16 17 18  ' CREATININE 0.83 0.88 0.80  CALCIUM  8.5* 8.2*  --     CBC: Recent Labs  Lab 05/29/22 0655 05/30/22 1241 05/30/22 1249  WBC 5.1 6.9  --   NEUTROABS 3.1 5.0  --   HGB 10.8* 10.6* 11.2*  HCT 34.4* 33.1* 33.0*  MCV 90.8 90.4  --   PLT 263 258  --     Cardiac Enzymes: No results for input(s): "CKTOTAL", "CKMB", "CKMBINDEX", "TROPONINI" in the last 168 hours.  Lipid Panel: No results for input(s): "CHOL", "TRIG", "HDL", "CHOLHDL", "VLDL", "LDLCALC" in the last 168 hours.  Imaging: MR ANGIO HEAD WO CONTRAST  Result Date: 05/30/2022 CLINICAL DATA:  Left-sided weakness and facial droop following cerebral arteriogram. Pontine infarct. EXAM: MRA HEAD WITHOUT CONTRAST TECHNIQUE: Angiographic images of the Circle of Willis were acquired using MRA technique without intravenous contrast. COMPARISON:  Cerebral arteriogram 05/29/2022 FINDINGS: Anterior circulation: Internal carotid arteries are within normal limits high cervical segments through the ICA termini bilaterally. Some attenuation proximal A2 segments likely artifact from the coil pack. The A1 and M1 segments are normal bilaterally. MCA bifurcations are within limits. The ACA and MCA branch vessels are within normal. Posterior circulation: The vertebral arteries are codominant. Vertebrobasilar junction and basilar artery are normal. Prominent AICA vessels are noted bilaterally. Both posterior cerebral arteries originate from basilar tip. The PCA branch vessels are within normal limits. Anatomic variants: None Other: None. IMPRESSION: Normal MRA Circle of Willis. Electronically Signed   By: San Morelle M.D.   On: 05/30/2022 17:35   MR Angiogram Neck W or Wo Contrast  Result Date: 05/30/2022 CLINICAL DATA:  Left-sided weakness and facial droop. Acute brainstem infarct. Status post cerebral arteriogram yesterday. EXAM: MRA NECK WITHOUT AND WITH CONTRAST TECHNIQUE: Multiplanar and multiecho pulse sequences of the neck were obtained without and with intravenous contrast.  Angiographic images of the neck were obtained using MRA technique without and with intravenous contrast. CONTRAST:  4.22m GADAVIST GADOBUTROL 1 MMOL/ML IV SOLN COMPARISON:  Cerebral arteriogram 05/29/2022 FINDINGS: The time-of-flight images are severely degraded by patient motion. The postcontrast images demonstrate a common origin of the left common carotid artery and innominate artery. Right common carotid artery is within normal limits. Bifurcation unremarkable. Cervical right ICA is normal. The left common carotid artery is. Bifurcation is within normal limits. Cervical left ICA is normal. The vertebral arteries are codominant. Both vertebral arteries originate from the subclavian arteries without significant stenosis. No significant stenosis is present in either vertebral artery in the neck. Intracranial vertebral arteries, vertebrobasilar junction and basilar artery are normal. IMPRESSION: 1. Normal MRA of the neck. 2. No acute or focal abnormality to explain brainstem infarct. Electronically Signed   By: CSan MorelleM.D.   On: 05/30/2022 17:34   MR BRAIN WO CONTRAST  Result Date: 05/30/2022 CLINICAL DATA:  A deficit, acute, stroke suspected. Left-sided weakness facial droop. Cerebral are angiography yesterday. EXAM: MRI HEAD WITHOUT CONTRAST TECHNIQUE: Multiplanar, multiecho pulse sequences of the brain and surrounding structures were obtained without intravenous contrast. COMPARISON:  None Available. FINDINGS: Brain: The diffusion-weighted images demonstrate acute nonhemorrhagic right paramedian pontine infarct measuring  up to 2 cm in maximal diameter. Additional punctate foci of restricted diffusion are present in the right cerebellum and right greater than left occipital lobes. No anterior circulation infarcts are present. T2 signal changes are associated with the areas of acute infarct. No other significant white matter disease is present. The ventricles are of normal size. No significant  extraaxial fluid collection is present. The internal auditory canals are within normal limits. Vascular: Flow is present in the major intracranial arteries. Skull and upper cervical spine: The craniocervical junction is normal. Upper cervical spine is within normal limits. Marrow signal is unremarkable. Sinuses/Orbits: The paranasal sinuses and mastoid air cells are clear. Bilateral lens replacements are noted. Globes and orbits are otherwise unremarkable. IMPRESSION: 1. 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 2. Additional punctate foci of acute nonhemorrhagic infarct involving the right cerebellum and right greater than left occipital lobes. These results were called by telephone at the time of interpretation on 05/30/2022 at 5:23 pm to provider Dr. Truett Mainland, who verbally acknowledged these results. Electronically Signed   By: San Morelle M.D.   On: 05/30/2022 17:24   IR ANGIO VERTEBRAL SEL VERTEBRAL UNI R MOD SED  Result Date: 05/29/2022 INDICATION: 71 year old female with past medical history significant for colitis, asthma, DM, hypertension, anemia and psoriatic arthritis. 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 She returns today for a diagnostic cerebral angiogram to evaluate treatment outcome. EXAM: ULTRASOUND-GUIDED VASCULAR ACCESS DIAGNOSTIC CEREBRAL ANGIOGRAM COMPARISON:  Cerebral angiogram October 01, 2021. MEDICATIONS: 5,000 IU heparin and 300 mcg nitroglycerin intra-arterially to right radial artery. ANESTHESIA/SEDATION: Moderate (conscious) sedation was employed during this procedure. A total of Versed 1 mg and Fentanyl 50 mcg was administered intravenously by the radiology nurse. Total intra-service moderate Sedation Time: 41 minutes. The patient's level of consciousness and vital signs were monitored continuously by radiology nursing throughout the procedure under my direct supervision.  CONTRAST:  50 mL of Omnipaque 300 milligram/mL FLUOROSCOPY: Radiation Exposure Index (as provided by the fluoroscopic device): 355 mGy Kerma COMPLICATIONS: None immediate. TECHNIQUE: Informed written consent was obtained from the patient after a thorough discussion of the procedural risks, benefits and alternatives. All questions were addressed. Maximal Sterile Barrier Technique was utilized including caps, mask, sterile gowns, sterile gloves, sterile drape, hand hygiene and skin antiseptic. A timeout was performed prior to the initiation of the procedure. Using the modified Seldinger technique and a micropuncture kit, access was gained to the distal right radial artery at the anatomical snuffbox and a 5 French sheath was placed. Real-time ultrasound guidance was utilized for vascular access including the acquisition of a permanent ultrasound image documenting patency of the accessed vessel. Slow intra arterial infusion of 5,000 IU heparin and 300 mcg nitroglycerin diluted in patient's own blood was performed. No significant fluctuation in patient's blood pressure seen. Then, a right radial artery angiogram was obtained via sheath side port. Next, a 5 [place] Simmons 2 glide catheter was navigated over a 0.035" Terumo Glidewire into the right subclavian artery under fluoroscopic guidance. Frontal roadmap of the neck were obtained. Under fluoroscopy, the catheter was placed in the right vertebral artery. Angiograms with frontal, lateral, magnified right anterior oblique and magnified lateral views of the head were obtained. The catheter was subsequently placed into the right common carotid artery. Frontal and lateral angiograms of the neck were obtained. Using biplane roadmap guidance, the catheter was placed into the right internal carotid artery. Angiograms with frontal, lateral,  magnified right anterior oblique, magnified left anterior oblique and magnified lateral views of the head were obtained. Due to radial  artery vasospasm resulting in significant pain and difficulty maneuvering the catheter common no images were obtained of the left anterior circulation. The catheter was subsequently withdrawn. An inflatable band was placed and inflated over the right hand access site. The vascular sheath was withdrawn and the band was slowly deflated until brisk flow was noted through the arteriotomy site. At this point, the band was reinflated with additional 2 cc of air to obtain patent hemostasis. FINDINGS: Right radial artery ultrasound and right radial artery angiogram: The caliber of the distal right radial artery is appropriate for angiogram access. The right radial artery and the right ulnar artery have normal course and caliber, noting mild to moderate vasospasm in the mid radial artery. No significant anatomical variants noted. Right CCA angiograms: Cervical angiograms show normal course and caliber of the visualized right common carotid and internal carotid arteries. There are no significant stenoses. Right ICA angiograms: Status post treatment of a right A1-A2/ACA junction aneurysm with a web device. There is complete aneurysm occlusion without evidence of residual or recurrent aneurysm. No de Novo aneurysm identified. There is brisk vascular contrast filling of the right ACA and MCA vascular trees. Luminal caliber is smooth and tapering. No abnormally high-flow, early draining veins are seen. No regions of abnormal hypervascularity are noted. The visualized dural sinuses are patent. Right vertebral artery angiograms: The right vertebral artery, basilar artery, and bilateral posterior cerebral arteries are unremarkable. Bilateral AICA-PICA complexes are noted and appear unremarkable. Luminal caliber is smooth and tapering. No aneurysms or abnormally high-flow, early draining veins are seen. No regions of abnormal hypervascularity are noted. The visualized dural sinuses are patent. Right subclavian artery roadmap: The  right subclavian artery, proximal right vertebral artery and visualized branches of the thyrocervical trunk are unremarkable. PROCEDURE: No intervention performed. 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 No evidence of residual or recurrent aneurysm. PLAN: A follow-up MR angiogram within 1 year will be obtained. Electronically Signed   By: Pedro Earls M.D.   On: 05/29/2022 11:47   IR ANGIO INTRA EXTRACRAN SEL INTERNAL CAROTID UNI R MOD SED  Result Date: 05/29/2022 INDICATION: 71 year old female with past medical history significant for colitis, asthma, DM, hypertension, anemia and psoriatic arthritis. 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 She returns today for a diagnostic cerebral angiogram to evaluate treatment outcome. EXAM: ULTRASOUND-GUIDED VASCULAR ACCESS DIAGNOSTIC CEREBRAL ANGIOGRAM COMPARISON:  Cerebral angiogram October 01, 2021. MEDICATIONS: 5,000 IU heparin and 300 mcg nitroglycerin intra-arterially to right radial artery. ANESTHESIA/SEDATION: Moderate (conscious) sedation was employed during this procedure. A total of Versed 1 mg and Fentanyl 50 mcg was administered intravenously by the radiology nurse. Total intra-service moderate Sedation Time: 41 minutes. The patient's level of consciousness and vital signs were monitored continuously by radiology nursing throughout the procedure under my direct supervision. CONTRAST:  50 mL of Omnipaque 300 milligram/mL FLUOROSCOPY: Radiation Exposure Index (as provided by the fluoroscopic device): 962 mGy Kerma COMPLICATIONS: None immediate. TECHNIQUE: Informed written consent was obtained from the patient after a thorough discussion of the procedural risks, benefits and alternatives. All questions were addressed. Maximal Sterile Barrier Technique was utilized including  caps, mask, sterile gowns, sterile gloves, sterile drape, hand hygiene and skin antiseptic. A timeout was performed prior to the initiation of  the procedure. Using the modified Seldinger technique and a micropuncture kit, access was gained to the distal right radial artery at the anatomical snuffbox and a 5 French sheath was placed. Real-time ultrasound guidance was utilized for vascular access including the acquisition of a permanent ultrasound image documenting patency of the accessed vessel. Slow intra arterial infusion of 5,000 IU heparin and 300 mcg nitroglycerin diluted in patient's own blood was performed. No significant fluctuation in patient's blood pressure seen. Then, a right radial artery angiogram was obtained via sheath side port. Next, a 5 [place] Simmons 2 glide catheter was navigated over a 0.035" Terumo Glidewire into the right subclavian artery under fluoroscopic guidance. Frontal roadmap of the neck were obtained. Under fluoroscopy, the catheter was placed in the right vertebral artery. Angiograms with frontal, lateral, magnified right anterior oblique and magnified lateral views of the head were obtained. The catheter was subsequently placed into the right common carotid artery. Frontal and lateral angiograms of the neck were obtained. Using biplane roadmap guidance, the catheter was placed into the right internal carotid artery. Angiograms with frontal, lateral, magnified right anterior oblique, magnified left anterior oblique and magnified lateral views of the head were obtained. Due to radial artery vasospasm resulting in significant pain and difficulty maneuvering the catheter common no images were obtained of the left anterior circulation. The catheter was subsequently withdrawn. An inflatable band was placed and inflated over the right hand access site. The vascular sheath was withdrawn and the band was slowly deflated until brisk flow was noted through the arteriotomy site. At this point,  the band was reinflated with additional 2 cc of air to obtain patent hemostasis. FINDINGS: Right radial artery ultrasound and right radial artery angiogram: The caliber of the distal right radial artery is appropriate for angiogram access. The right radial artery and the right ulnar artery have normal course and caliber, noting mild to moderate vasospasm in the mid radial artery. No significant anatomical variants noted. Right CCA angiograms: Cervical angiograms show normal course and caliber of the visualized right common carotid and internal carotid arteries. There are no significant stenoses. Right ICA angiograms: Status post treatment of a right A1-A2/ACA junction aneurysm with a web device. There is complete aneurysm occlusion without evidence of residual or recurrent aneurysm. No de Novo aneurysm identified. There is brisk vascular contrast filling of the right ACA and MCA vascular trees. Luminal caliber is smooth and tapering. No abnormally high-flow, early draining veins are seen. No regions of abnormal hypervascularity are noted. The visualized dural sinuses are patent. Right vertebral artery angiograms: The right vertebral artery, basilar artery, and bilateral posterior cerebral arteries are unremarkable. Bilateral AICA-PICA complexes are noted and appear unremarkable. Luminal caliber is smooth and tapering. No aneurysms or abnormally high-flow, early draining veins are seen. No regions of abnormal hypervascularity are noted. The visualized dural sinuses are patent. Right subclavian artery roadmap: The right subclavian artery, proximal right vertebral artery and visualized branches of the thyrocervical trunk are unremarkable. PROCEDURE: No intervention performed. 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 No evidence of residual or recurrent aneurysm. PLAN: A follow-up MR angiogram within 1 year will be obtained.  Electronically Signed   By: Pedro Earls M.D.   On: 05/29/2022 11:47   IR US Guide Vasc Access Right  Result Date: 05/29/2022 INDICATION: 71 year old female with past medical history significant for colitis, asthma, DM, hypertension, anemia and psoriatic arthritis. She underwent endovascular embolization of  an anterior communicating artery aneurysm October 01, 2021 with a web device. She returns today for a diagnostic cerebral angiogram to evaluate treatment outcome. EXAM: ULTRASOUND-GUIDED VASCULAR ACCESS DIAGNOSTIC CEREBRAL ANGIOGRAM COMPARISON:  Cerebral angiogram October 01, 2021. MEDICATIONS: 5,000 IU heparin and 300 mcg nitroglycerin intra-arterially to right radial artery. ANESTHESIA/SEDATION: Moderate (conscious) sedation was employed during this procedure. A total of Versed 1 mg and Fentanyl 50 mcg was administered intravenously by the radiology nurse. Total intra-service moderate Sedation Time: 41 minutes. The patient's level of consciousness and vital signs were monitored continuously by radiology nursing throughout the procedure under my direct supervision. CONTRAST:  50 mL of Omnipaque 300 milligram/mL FLUOROSCOPY: Radiation Exposure Index (as provided by the fluoroscopic device): 818 mGy Kerma COMPLICATIONS: None immediate. TECHNIQUE: Informed written consent was obtained from the patient after a thorough discussion of the procedural risks, benefits and alternatives. All questions were addressed. Maximal Sterile Barrier Technique was utilized including caps, mask, sterile gowns, sterile gloves, sterile drape, hand hygiene and skin antiseptic. A timeout was performed prior to the initiation of the procedure. Using the modified Seldinger technique and a micropuncture kit, access was gained to the distal right radial artery at the anatomical snuffbox and a 5 French sheath was placed. Real-time ultrasound guidance was utilized for vascular access including the acquisition of a  permanent ultrasound image documenting patency of the accessed vessel. Slow intra arterial infusion of 5,000 IU heparin and 300 mcg nitroglycerin diluted in patient's own blood was performed. No significant fluctuation in patient's blood pressure seen. Then, a right radial artery angiogram was obtained via sheath side port. Next, a 5 [place] Simmons 2 glide catheter was navigated over a 0.035" Terumo Glidewire into the right subclavian artery under fluoroscopic guidance. Frontal roadmap of the neck were obtained. Under fluoroscopy, the catheter was placed in the right vertebral artery. Angiograms with frontal, lateral, magnified right anterior oblique and magnified lateral views of the head were obtained. The catheter was subsequently placed into the right common carotid artery. Frontal and lateral angiograms of the neck were obtained. Using biplane roadmap guidance, the catheter was placed into the right internal carotid artery. Angiograms with frontal, lateral, magnified right anterior oblique, magnified left anterior oblique and magnified lateral views of the head were obtained. Due to radial artery vasospasm resulting in significant pain and difficulty maneuvering the catheter common no images were obtained of the left anterior circulation. The catheter was subsequently withdrawn. An inflatable band was placed and inflated over the right hand access site. The vascular sheath was withdrawn and the band was slowly deflated until brisk flow was noted through the arteriotomy site. At this point, the band was reinflated with additional 2 cc of air to obtain patent hemostasis. FINDINGS: Right radial artery ultrasound and right radial artery angiogram: The caliber of the distal right radial artery is appropriate for angiogram access. The right radial artery and the right ulnar artery have normal course and caliber, noting mild to moderate vasospasm in the mid radial artery. No significant anatomical variants noted.  Right CCA angiograms: Cervical angiograms show normal course and caliber of the visualized right common carotid and internal carotid arteries. There are no significant stenoses. Right ICA angiograms: Status post treatment of a right A1-A2/ACA junction aneurysm with a web device. There is complete aneurysm occlusion without evidence of residual or recurrent aneurysm. No de Novo aneurysm identified. There is brisk vascular contrast filling of the right ACA and MCA vascular trees. Luminal caliber is smooth and tapering. No abnormally  high-flow, early draining veins are seen. No regions of abnormal hypervascularity are noted. The visualized dural sinuses are patent. Right vertebral artery angiograms: The right vertebral artery, basilar artery, and bilateral posterior cerebral arteries are unremarkable. Bilateral AICA-PICA complexes are noted and appear unremarkable. Luminal caliber is smooth and tapering. No aneurysms or abnormally high-flow, early draining veins are seen. No regions of abnormal hypervascularity are noted. The visualized dural sinuses are patent. Right subclavian artery roadmap: The right subclavian artery, proximal right vertebral artery and visualized branches of the thyrocervical trunk are unremarkable. PROCEDURE: No intervention performed. 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 No evidence of residual or recurrent aneurysm. PLAN: A follow-up MR angiogram within 1 year will be obtained. Electronically Signed   By: Pedro Earls M.D.   On: 05/29/2022 11:47     Assessment: 71 year old female with posterior circulation stroke symptoms first noticed after catheter-based cerebral angiogram on Friday - Exam reveals findings referable to the right pontine stroke seen on MRI.  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 Additional punctate foci of acute nonhemorrhagic  infarct involving the right cerebellum and right greater than left occipital lobes. - MRA head: Normal - MRA neck: Normal MRA of the neck. No acute or focal abnormality to explain brainstem infarct.    Recommendations: 1. May not be able to use ASA for secondary stroke prevention as she has had increased bleeding with this medication in the past. 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 Potential for severe GI bleeding with associated morbidity/mortality is high enough that benefits of ASA or Plavix for stroke prevention are felt to be significantly outweighed by the overall risks.  2. Agree with Lovenox for DVT prevention as it is relatively low risk.  3. Continue her statin 4. PT/OT/Speech 5. HgbA1c, fasting lipid panel 6. TTE 7. Statin   8. Risk factor modification 9. Telemetry monitoring 10. Frequent neuro checks 11. NPO until passes stroke swallow screen 12. Glycemic control  Electronically signed: Dr. Kerney Elbe 05/30/2022, 7:31 PM

## 2022-05-30 NOTE — ED Provider Notes (Signed)
[facility] EMERGENCY DEPARTMENT Provider Note   CSN: [id number] Arrival date & time: 05/30/22  1146     History  Chief Complaint  Patient presents with   Weakness   Stroke Symptoms    Ashley Gross is a 71 y.o. female.   Weakness   Patient with medical history of hypertension, hyperlipidemia, diabetes presents today due to left-sided weakness and facial droop.  Yesterday had outpatient diagnostic cerebral angiography, last known well 8 AM prior to the procedure.  After the procedure she had slurred speech, severe headache and diplopia. She has been unable to ambulate due to weakness in the legs bilaterally.  Patient states she said she had the symptoms at the facility, states it was attributed to the sedation from the procedure.  Patient has been having slurred speech which worsened today, she also has left-sided facial droop today.  Feels weak on the left side, no headache or diplopia or vision changes right now.  Not in any anticoagulation.  Daughter called PA, PA called EMS to bring patient to ED for stroke work-up.  Home Medications Prior to Admission medications   Medication Sig Start Date End Date Taking? Authorizing Provider  acetaminophen (TYLENOL) 500 MG tablet Take 2 tablets (1,000 mg total) by mouth every 6 (six) hours as needed for mild pain. 12/16/21   Olean Ree, MD  albuterol (PROVENTIL) (2.5 MG/3ML) 0.083% nebulizer solution Take 3 mLs (2.5 mg total) by nebulization every 4 (four) hours as needed for wheezing or shortness of breath. 04/02/21   McLean-Scocuzza, Nino Glow, MD  albuterol (VENTOLIN HFA) 108 (90 Base) MCG/ACT inhaler Inhale 2 puffs into the lungs every 6 (six) hours as needed for wheezing or shortness of breath.    [provider]  amLODipine (NORVASC) 5 MG tablet TAKE 1 TABLET(5 MG) BY MOUTH TWICE DAILY 08/26/21   Einar Pheasant, MD  budesonide-formoterol [facility]) 160-4.5 MCG/ACT inhaler Inhale 2 puffs into the lungs 2 (two)  times daily. Patient taking differently: Inhale 2 puffs into the lungs 2 (two) times daily as needed (shortness of breath). 11/05/21   Einar Pheasant, MD  Cholecalciferol (VITAMIN D) 50 MCG (2000 UT) tablet Take 2,000 Units by mouth daily.    [provider]  CREON 36000-114000 units CPEP capsule Take 32,122-48,250 Units by mouth See admin instructions. Take 720000 units with each meal and 36000 units with snacks 08/08/21   [provider]  diphenhydramine-acetaminophen (TYLENOL PM) 25-500 MG TABS tablet Take 2 tablets by mouth at bedtime as needed (sleep).    [provider]  fluticasone (FLOVENT HFA) 110 MCG/ACT inhaler Inhale 2 puffs into the lungs 2 (two) times daily as needed (shortness of breath).    [provider]  gabapentin (NEURONTIN) 100 MG capsule Take 400 mg by mouth at bedtime. 01/12/22   [provider]  insulin degludec (TRESIBA FLEXTOUCH) 100 UNIT/ML FlexTouch Pen Inject 14 Units into the skin daily. Patient taking differently: Inject 10 Units into the skin daily with supper. 11/05/21   Einar Pheasant, MD  loperamide (IMODIUM) 2 MG capsule Take 4 mg by mouth daily.    [provider]  losartan (COZAAR) 100 MG tablet Take 1 tablet (100 mg total) by mouth at bedtime. 01/29/22   Einar Pheasant, MD  Melatonin 10 MG TABS Take 10 mg by mouth at bedtime.    [provider]  omeprazole (PRILOSEC) 20 MG capsule TAKE 1 CAPSULE(20 MG) BY MOUTH TWICE DAILY 06/27/21   Einar Pheasant, MD  ONE  TOUCH ULTRA TEST test strip PATIENT NEEDS NEW METER STRIPS AND LANCETS FOR ONE TOUCH. HER INSURANCE NO LONGER COVERS ACCUCHEK. 07/31/15   Einar Pheasant, MD  rosuvastatin (CRESTOR) 10 MG tablet TAKE 1 TABLET(10 MG) BY MOUTH DAILY 03/26/22   Einar Pheasant, MD      Allergies    Aspirin, Beta adrenergic blockers, Lisinopril, Mtx support [cobalamine combinations], Vasotec [enalapril], Verapamil, Voltaren [diclofenac sodium], Erythromycin, Indocin  [indomethacin], and Jardiance [empagliflozin]    Review of Systems   Review of Systems  Neurological:  Positive for weakness.    Physical Exam Updated Vital Signs BP (!) 150/60   Pulse 60   Temp 97.7 F (36.5 C)   Resp 15   SpO2 100%  Physical Exam Vitals and nursing note reviewed. Exam conducted with a chaperone present.  Constitutional:      Appearance: Normal appearance.  HENT:     Head: Normocephalic and atraumatic.  Eyes:     General: No scleral icterus.       Right eye: No discharge.        Left eye: No discharge.     Extraocular Movements: Extraocular movements intact.     Pupils: Pupils are equal, round, and reactive to light.  Cardiovascular:     Rate and Rhythm: Normal rate and regular rhythm.     Pulses: Normal pulses.     Heart sounds: Murmur heard.     No friction rub. No gallop.  Pulmonary:     Effort: Pulmonary effort is normal. No respiratory distress.     Breath sounds: Normal breath sounds.  Abdominal:     General: Abdomen is flat. Bowel sounds are normal. There is no distension.     Palpations: Abdomen is soft.     Tenderness: There is no abdominal tenderness.  Skin:    General: Skin is warm and dry.     Capillary Refill: Capillary refill takes less than 2 seconds.     Coloration: Skin is not jaundiced.  Neurological:     Mental Status: She is alert.     Coordination: Coordination normal.     Comments: Left-sided facial droop.  Left pronator drift.  Grip strength to left upper extremity reduced compared to right, subjective left-sided sensation to upper extremity and face.  Decreased pain to left lower extremity.     ED Results / Procedures / Treatments   Labs (all labs ordered are listed, but only abnormal results are displayed) Labs Reviewed  CBC - Abnormal; Notable for the following components:      Result Value   RBC 3.66 (*)    Hemoglobin 10.6 (*)    HCT 33.1 (*)    RDW 21.6 (*)    All other components within normal limits   COMPREHENSIVE METABOLIC PANEL - Abnormal; Notable for the following components:   Glucose, Bld 190 (*)    Calcium 8.2 (*)    Total Protein 5.2 (*)    Albumin 3.0 (*)    Total Bilirubin 0.2 (*)    All other components within normal limits  CBG MONITORING, ED - Abnormal; Notable for the following components:   Glucose-Capillary 174 (*)    All other components within normal limits  I-STAT CHEM 8, ED - Abnormal; Notable for the following components:   Glucose, Bld 180 (*)    Calcium, Ion 1.09 (*)    Hemoglobin 11.2 (*)    HCT 33.0 (*)    All other components within normal limits  RESP PANEL BY RT-PCR (  FLU A&B, COVID) ARPGX2  ETHANOL  PROTIME-INR  APTT  DIFFERENTIAL  RAPID URINE DRUG SCREEN, HOSP PERFORMED  URINALYSIS, ROUTINE W REFLEX MICROSCOPIC    EKG EKG Interpretation  Date/Time:  Saturday May 30 2022 12:47:26 EDT Ventricular Rate:  57 PR Interval:  192 QRS Duration: 87 QT Interval:  444 QTC Calculation: 433 R Axis:   51 Text Interpretation: Sinus rhythm Abnormal R-wave progression, early transition when compared to prior, slower rate. No sTEMI Confirmed by Antony Blackbird [phone]) on 05/30/2022 12:57:47 PM  Radiology IR ANGIO VERTEBRAL SEL VERTEBRAL UNI R MOD SED  Result Date: 05/29/2022 INDICATION: 71 year old female with past medical history significant for colitis, asthma, DM, hypertension, anemia and psoriatic arthritis. 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 She returns today for a diagnostic cerebral angiogram to evaluate treatment outcome. EXAM: ULTRASOUND-GUIDED VASCULAR ACCESS DIAGNOSTIC CEREBRAL ANGIOGRAM COMPARISON:  Cerebral angiogram October 01, 2021. MEDICATIONS: 5,000 IU heparin and 300 mcg nitroglycerin intra-arterially to right radial artery. ANESTHESIA/SEDATION: Moderate (conscious) sedation was employed during this procedure. A total of Versed 1 mg and Fentanyl 50 mcg was  administered intravenously by the radiology nurse. Total intra-service moderate Sedation Time: 41 minutes. The patient's level of consciousness and vital signs were monitored continuously by radiology nursing throughout the procedure under my direct supervision. CONTRAST:  50 mL of Omnipaque 300 milligram/mL FLUOROSCOPY: Radiation Exposure Index (as provided by the fluoroscopic device): 295 mGy Kerma COMPLICATIONS: None immediate. TECHNIQUE: Informed written consent was obtained from the patient after a thorough discussion of the procedural risks, benefits and alternatives. All questions were addressed. Maximal Sterile Barrier Technique was utilized including caps, mask, sterile gowns, sterile gloves, sterile drape, hand hygiene and skin antiseptic. A timeout was performed prior to the initiation of the procedure. Using the modified Seldinger technique and a micropuncture kit, access was gained to the distal right radial artery at the anatomical snuffbox and a 5 French sheath was placed. Real-time ultrasound guidance was utilized for vascular access including the acquisition of a permanent ultrasound image documenting patency of the accessed vessel. Slow intra arterial infusion of 5,000 IU heparin and 300 mcg nitroglycerin diluted in patient's own blood was performed. No significant fluctuation in patient's blood pressure seen. Then, a right radial artery angiogram was obtained via sheath side port. Next, a 5 [place] Simmons 2 glide catheter was navigated over a 0.035" Terumo Glidewire into the right subclavian artery under fluoroscopic guidance. Frontal roadmap of the neck were obtained. Under fluoroscopy, the catheter was placed in the right vertebral artery. Angiograms with frontal, lateral, magnified right anterior oblique and magnified lateral views of the head were obtained. The catheter was subsequently placed into the right common carotid artery. Frontal and lateral angiograms of the neck were obtained. Using  biplane roadmap guidance, the catheter was placed into the right internal carotid artery. Angiograms with frontal, lateral, magnified right anterior oblique, magnified left anterior oblique and magnified lateral views of the head were obtained. Due to radial artery vasospasm resulting in significant pain and difficulty maneuvering the catheter common no images were obtained of the left anterior circulation. The catheter was subsequently withdrawn. An inflatable band was placed and inflated over the right hand access site. The vascular sheath was withdrawn and the band was slowly deflated until brisk flow was noted through the arteriotomy site. At this point, the band was reinflated with additional 2 cc of air to obtain patent hemostasis. FINDINGS: Right radial artery ultrasound and  right radial artery angiogram: The caliber of the distal right radial artery is appropriate for angiogram access. The right radial artery and the right ulnar artery have normal course and caliber, noting mild to moderate vasospasm in the mid radial artery. No significant anatomical variants noted. Right CCA angiograms: Cervical angiograms show normal course and caliber of the visualized right common carotid and internal carotid arteries. There are no significant stenoses. Right ICA angiograms: Status post treatment of a right A1-A2/ACA junction aneurysm with a web device. There is complete aneurysm occlusion without evidence of residual or recurrent aneurysm. No de Novo aneurysm identified. There is brisk vascular contrast filling of the right ACA and MCA vascular trees. Luminal caliber is smooth and tapering. No abnormally high-flow, early draining veins are seen. No regions of abnormal hypervascularity are noted. The visualized dural sinuses are patent. Right vertebral artery angiograms: The right vertebral artery, basilar artery, and bilateral posterior cerebral arteries are unremarkable. Bilateral AICA-PICA complexes are noted and  appear unremarkable. Luminal caliber is smooth and tapering. No aneurysms or abnormally high-flow, early draining veins are seen. No regions of abnormal hypervascularity are noted. The visualized dural sinuses are patent. Right subclavian artery roadmap: The right subclavian artery, proximal right vertebral artery and visualized branches of the thyrocervical trunk are unremarkable. PROCEDURE: No intervention performed. 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 No evidence of residual or recurrent aneurysm. PLAN: A follow-up MR angiogram within 1 year will be obtained. Electronically Signed   By: Pedro Earls M.D.   On: 05/29/2022 11:47   IR ANGIO INTRA EXTRACRAN SEL INTERNAL CAROTID UNI R MOD SED  Result Date: 05/29/2022 INDICATION: 71 year old female with past medical history significant for colitis, asthma, DM, hypertension, anemia and psoriatic arthritis. 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 She returns today for a diagnostic cerebral angiogram to evaluate treatment outcome. EXAM: ULTRASOUND-GUIDED VASCULAR ACCESS DIAGNOSTIC CEREBRAL ANGIOGRAM COMPARISON:  Cerebral angiogram October 01, 2021. MEDICATIONS: 5,000 IU heparin and 300 mcg nitroglycerin intra-arterially to right radial artery. ANESTHESIA/SEDATION: Moderate (conscious) sedation was employed during this procedure. A total of Versed 1 mg and Fentanyl 50 mcg was administered intravenously by the radiology nurse. Total intra-service moderate Sedation Time: 41 minutes. The patient's level of consciousness and vital signs were monitored continuously by radiology nursing throughout the procedure under my direct supervision. CONTRAST:  50 mL of Omnipaque 300 milligram/mL FLUOROSCOPY: Radiation Exposure Index (as provided by the fluoroscopic device): 712 mGy Kerma  COMPLICATIONS: None immediate. TECHNIQUE: Informed written consent was obtained from the patient after a thorough discussion of the procedural risks, benefits and alternatives. All questions were addressed. Maximal Sterile Barrier Technique was utilized including caps, mask, sterile gowns, sterile gloves, sterile drape, hand hygiene and skin antiseptic. A timeout was performed prior to the initiation of the procedure. Using the modified Seldinger technique and a micropuncture kit, access was gained to the distal right radial artery at the anatomical snuffbox and a 5 French sheath was placed. Real-time ultrasound guidance was utilized for vascular access including the acquisition of a permanent ultrasound image documenting patency of the accessed vessel. Slow intra arterial infusion of 5,000 IU heparin and 300 mcg nitroglycerin diluted in patient's own blood was performed. No significant fluctuation in patient's blood pressure seen. Then, a right radial artery angiogram was obtained via sheath side port. Next, a 5 [place] Simmons 2 glide catheter was navigated over  a 0.035" Terumo Glidewire into the right subclavian artery under fluoroscopic guidance. Frontal roadmap of the neck were obtained. Under fluoroscopy, the catheter was placed in the right vertebral artery. Angiograms with frontal, lateral, magnified right anterior oblique and magnified lateral views of the head were obtained. The catheter was subsequently placed into the right common carotid artery. Frontal and lateral angiograms of the neck were obtained. Using biplane roadmap guidance, the catheter was placed into the right internal carotid artery. Angiograms with frontal, lateral, magnified right anterior oblique, magnified left anterior oblique and magnified lateral views of the head were obtained. Due to radial artery vasospasm resulting in significant pain and difficulty maneuvering the catheter common no images were obtained of the left anterior  circulation. The catheter was subsequently withdrawn. An inflatable band was placed and inflated over the right hand access site. The vascular sheath was withdrawn and the band was slowly deflated until brisk flow was noted through the arteriotomy site. At this point, the band was reinflated with additional 2 cc of air to obtain patent hemostasis. FINDINGS: Right radial artery ultrasound and right radial artery angiogram: The caliber of the distal right radial artery is appropriate for angiogram access. The right radial artery and the right ulnar artery have normal course and caliber, noting mild to moderate vasospasm in the mid radial artery. No significant anatomical variants noted. Right CCA angiograms: Cervical angiograms show normal course and caliber of the visualized right common carotid and internal carotid arteries. There are no significant stenoses. Right ICA angiograms: Status post treatment of a right A1-A2/ACA junction aneurysm with a web device. There is complete aneurysm occlusion without evidence of residual or recurrent aneurysm. No de Novo aneurysm identified. There is brisk vascular contrast filling of the right ACA and MCA vascular trees. Luminal caliber is smooth and tapering. No abnormally high-flow, early draining veins are seen. No regions of abnormal hypervascularity are noted. The visualized dural sinuses are patent. Right vertebral artery angiograms: The right vertebral artery, basilar artery, and bilateral posterior cerebral arteries are unremarkable. Bilateral AICA-PICA complexes are noted and appear unremarkable. Luminal caliber is smooth and tapering. No aneurysms or abnormally high-flow, early draining veins are seen. No regions of abnormal hypervascularity are noted. The visualized dural sinuses are patent. Right subclavian artery roadmap: The right subclavian artery, proximal right vertebral artery and visualized branches of the thyrocervical trunk are unremarkable. PROCEDURE: No  intervention performed. 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 No evidence of residual or recurrent aneurysm. PLAN: A follow-up MR angiogram within 1 year will be obtained. Electronically Signed   By: Pedro Earls M.D.   On: 05/29/2022 11:47   IR US Guide Vasc Access Right  Result Date: 05/29/2022 INDICATION: 71 year old female with past medical history significant for colitis, asthma, DM, hypertension, anemia and psoriatic arthritis. 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 She returns today for a diagnostic cerebral angiogram to evaluate treatment outcome. EXAM: ULTRASOUND-GUIDED VASCULAR ACCESS DIAGNOSTIC CEREBRAL ANGIOGRAM COMPARISON:  Cerebral angiogram October 01, 2021. MEDICATIONS: 5,000 IU heparin and 300 mcg nitroglycerin intra-arterially to right radial artery. ANESTHESIA/SEDATION: Moderate (conscious) sedation was employed during this procedure. A total of Versed 1 mg and Fentanyl 50 mcg was administered intravenously by the radiology nurse. Total intra-service moderate Sedation Time: 41 minutes. The patient's level of consciousness and vital signs were monitored continuously by radiology nursing throughout the procedure under my  direct supervision. CONTRAST:  50 mL of Omnipaque 300 milligram/mL FLUOROSCOPY: Radiation Exposure Index (as provided by the fluoroscopic device): 151 mGy Kerma COMPLICATIONS: None immediate. TECHNIQUE: Informed written consent was obtained from the patient after a thorough discussion of the procedural risks, benefits and alternatives. All questions were addressed. Maximal Sterile Barrier Technique was utilized including caps, mask, sterile gowns, sterile gloves, sterile drape, hand hygiene and skin antiseptic. A timeout was performed prior to the initiation of the procedure. Using the  modified Seldinger technique and a micropuncture kit, access was gained to the distal right radial artery at the anatomical snuffbox and a 5 French sheath was placed. Real-time ultrasound guidance was utilized for vascular access including the acquisition of a permanent ultrasound image documenting patency of the accessed vessel. Slow intra arterial infusion of 5,000 IU heparin and 300 mcg nitroglycerin diluted in patient's own blood was performed. No significant fluctuation in patient's blood pressure seen. Then, a right radial artery angiogram was obtained via sheath side port. Next, a 5 [place] Simmons 2 glide catheter was navigated over a 0.035" Terumo Glidewire into the right subclavian artery under fluoroscopic guidance. Frontal roadmap of the neck were obtained. Under fluoroscopy, the catheter was placed in the right vertebral artery. Angiograms with frontal, lateral, magnified right anterior oblique and magnified lateral views of the head were obtained. The catheter was subsequently placed into the right common carotid artery. Frontal and lateral angiograms of the neck were obtained. Using biplane roadmap guidance, the catheter was placed into the right internal carotid artery. Angiograms with frontal, lateral, magnified right anterior oblique, magnified left anterior oblique and magnified lateral views of the head were obtained. Due to radial artery vasospasm resulting in significant pain and difficulty maneuvering the catheter common no images were obtained of the left anterior circulation. The catheter was subsequently withdrawn. An inflatable band was placed and inflated over the right hand access site. The vascular sheath was withdrawn and the band was slowly deflated until brisk flow was noted through the arteriotomy site. At this point, the band was reinflated with additional 2 cc of air to obtain patent hemostasis. FINDINGS: Right radial artery ultrasound and right radial artery angiogram: The caliber  of the distal right radial artery is appropriate for angiogram access. The right radial artery and the right ulnar artery have normal course and caliber, noting mild to moderate vasospasm in the mid radial artery. No significant anatomical variants noted. Right CCA angiograms: Cervical angiograms show normal course and caliber of the visualized right common carotid and internal carotid arteries. There are no significant stenoses. Right ICA angiograms: Status post treatment of a right A1-A2/ACA junction aneurysm with a web device. There is complete aneurysm occlusion without evidence of residual or recurrent aneurysm. No de Novo aneurysm identified. There is brisk vascular contrast filling of the right ACA and MCA vascular trees. Luminal caliber is smooth and tapering. No abnormally high-flow, early draining veins are seen. No regions of abnormal hypervascularity are noted. The visualized dural sinuses are patent. Right vertebral artery angiograms: The right vertebral artery, basilar artery, and bilateral posterior cerebral arteries are unremarkable. Bilateral AICA-PICA complexes are noted and appear unremarkable. Luminal caliber is smooth and tapering. No aneurysms or abnormally high-flow, early draining veins are seen. No regions of abnormal hypervascularity are noted. The visualized dural sinuses are patent. Right subclavian artery roadmap: The right subclavian artery, proximal right vertebral artery and visualized branches of the thyrocervical trunk are unremarkable. PROCEDURE: No intervention performed. 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 No evidence of residual or recurrent aneurysm. PLAN: A follow-up MR angiogram within 1 year will be obtained. Electronically Signed   By: Pedro Earls M.D.   On: 05/29/2022 11:47    Procedures Procedures    Medications Ordered in ED Medications - No data to  display  ED Course/ Medical Decision Making/ A&P Clinical Course as of 05/30/22 1533  Sat May 30, 2022  1523 Admission regardless. TIA vs stroke. Consult kirkpatrick Admit L facial, l pronator drift. Alfredo Martinez proc) [CR]    Clinical Course User Index [CR] Wilnette Kales, PA                           Medical Decision Making Amount and/or Complexity of Data Reviewed Independent Historian: EMS External Data Reviewed: labs, radiology, ECG and notes. Labs: ordered. Decision-making details documented in ED Course. Radiology: ordered. Decision-making details documented in ED Course. ECG/medicine tests:  Decision-making details documented in ED Course.   Patient presents due to weakness, left-sided facial droop and slurred speech.  There is left-sided facial droop and objective left-sided weakness to the upper extremity as well as pronator drift of the left arm.  Concerned patient had a stroke or TIA during procedure yesterday.  She is outside the stroke window, my attending spoke with Dr. Leonel Ramsay with neurology who is following.  He advised MR brain wo, MR angio head wo, and MR angio neck w/ and w/o.  States CT head not indicated.  He will follow with the patient.  I ordered and viewed laboratory work-up.  CMP is without any gross electrolyte derangement or transaminitis.  Creatinine within normal limits for contrast study.  PT/INR normal, PTT normal, sleep full anemia with a hemoglobin of 10.6, no leukocytosis.  Ethanol is undetectable, UDS pending.  I reevaluated the patient, she has not had an MRI yet.  She does have a headache, denies any diplopia or vision changes.  MR imaging pending at time of shift change.  Patient care signed out to oncoming provider, disposition will depend on results but I have high suspicion for TIA/stroke.  Disposition pending MRI result and conversation with neurology but I suspect patient will need admission for TIA or stroke work-up.  Discussed HPI,  physical exam and plan of care for this patient with attending Marda Stalker. The attending physician evaluated this patient as part of a shared visit and agrees with plan of care.         Final Clinical Impression(s) / ED Diagnoses Final diagnoses:  None    Rx / DC Orders ED Discharge Orders     None         Sherrill Raring, [place] 05/30/22 1533    Tegeler, Gwenyth Allegra, MD 05/30/22 [phone]

## 2022-05-30 NOTE — ED Provider Notes (Signed)
  Physical Exam  BP (!) 162/127   Pulse 62   Temp 97.7 F (36.5 C)   Resp (!) 26   SpO2 100%   Physical Exam  Procedures  Procedures  ED Course / MDM   Clinical Course as of 05/30/22 1817  Sat May 30, 2022  1754 Consulted Dr. Leonel Ramsay of neurology regarding the patient.  He agreed with admission and assuming further treatment/care of patient. [CR]    Clinical Course User Index [CR] Wilnette Kales, PA   Medical Decision Making Amount and/or Complexity of Data Reviewed Labs: ordered. Radiology: ordered.  Risk OTC drugs. Prescription drug management.   Patient care handed off from [facility], [place] at shift change.  See prior note for full HPI/PE awaiting MRI results of the brain as well as neck to assess for presence of stroke.  Disposition was to admit for TIA versus stroke pending studies.  In short, patient began to experience facial droop, slurred speech and left-sided weakness and arm and leg yesterday after neurovascular angiogram.  She states she has been unable to walk due to weakness in legs.  She noticed some double vision as well as feeling fatigued and dizzy at times.  She is not taking any anticoagulation.  She was sent here by EMS to assess for possible stroke.  APTT/PT/INR normal.  No leukocytosis.  Mild anemia with hemoglobin 10.6 which is on par with patient's baseline.  No electrolyte abnormality noted.  No change in renal function.  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  Additional punctate foci of acute nonhemorrhagic infarct involving the right cerebellum and right greater then left occipital lobes.   MR angiogram of neck and head: No acute abnormalities in the neck.  Normal brainstem.  Normal circle of Willis.   Patient's work-up overall consistent with acute stroke.  Neurologist Dr. Leonel Ramsay was consulted regarding the patient he agreed with admission.  Hospital medicine Dr. Dr. Ileene Musa was s consulted  regarding patient he agreed with admission and assuming further treatment/care of patient.  Treatment plan was discussed with patient and family and they knowledge understanding were agreeable to said plan.  Patient was stable upon admission     Wilnette Kales, [place] 05/30/22 1851    Cristie Hem, MD 05/31/22 [phone]

## 2022-05-30 NOTE — Telephone Encounter (Signed)
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 She presented to [place] yesterday for outpatient diagnostic cerebral angiogram to evaluate treatment outcome. Procedure was performed without complication and patient was discharged home that afternoon. At time of discharge no new neurologic symptoms were noted.  Received voicemail from patient's daughter Lacretia Nicks at 10:22 am stating patient is having new neurologic symptoms of gait unsteadiness and slurred speech. Returned call to [place] at 10:36 am, she states Ms. Osland began to have some slurred speech and unsteadiness last night which they attributed to effects of the sedation mediations given yesterday during cerebral angiogram. However this morning patient's speech appeared more slurred and she is having more difficulty walking, she also thinks her mother may have a left sided facial droop. She denies that she is having any chest pain, dyspnea, n/v, headache or confusion. She is no longer with her mother as she needed to return home but is wondering what the next steps should be.   Roselyn Reef was instructed that patient needs to be evaluated ASAP for possible stroke given new neurologic symptoms, she asked that I call EMS for her so she can call other family members to be with her mother ASAP. 911 was called by this writer at 10:40 am and given the above information as well as the patient's address of [address] [place], [place] [address] and Darra Lis cell phone # of [phone]. Per dispatch they have EMS en route.   Please call NIR with any questions or concerns.   Candiss Norse, PA-C

## 2022-05-30 NOTE — ED Notes (Signed)
Patient transported to MRI 

## 2022-05-30 NOTE — Assessment & Plan Note (Addendum)
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 She has inflammatory polyposis in the colon with rectal bleeding the past that have resolved with removal of the polyps.  However rectal bleeding has returned and she is followed by GI Dr. Marius Ditch at [facility]. She is in the process of being started on a biologic vedolizumab Weyman Rodney) for this.  She is also being followed by hematology and his recently required several doses of IV iron infusion. -Hemoglobin is currently stable at 10.6 and this is back to her previous baseline after it had downward trended to 8.7  a month ago before her iron treatments.  -Given these issues, she is hesitant to start aspirin. Given embolic stroke likely due to her cerebral angiogram, appreciate neurology recommendation on antiplatelet therapy   -Obtain echocardiogram  -Obtain A1c and lipids -PT/OT/SLT -Frequent neuro checks and keep on telemetry -Allow for permissive hypertension with blood pressure treatment as needed only if systolic goes above 875

## 2022-05-30 NOTE — Assessment & Plan Note (Signed)
Continue home omeprazole

## 2022-05-30 NOTE — Assessment & Plan Note (Signed)
Continue statin. 

## 2022-05-31 ENCOUNTER — Observation Stay (HOSPITAL_COMMUNITY): Payer: PPO

## 2022-05-31 DIAGNOSIS — E118 Type 2 diabetes mellitus with unspecified complications: Secondary | ICD-10-CM | POA: Diagnosis not present

## 2022-05-31 DIAGNOSIS — I771 Stricture of artery: Secondary | ICD-10-CM | POA: Diagnosis not present

## 2022-05-31 DIAGNOSIS — Z794 Long term (current) use of insulin: Secondary | ICD-10-CM | POA: Diagnosis not present

## 2022-05-31 DIAGNOSIS — I6349 Cerebral infarction due to embolism of other cerebral artery: Secondary | ICD-10-CM | POA: Diagnosis not present

## 2022-05-31 DIAGNOSIS — I672 Cerebral atherosclerosis: Secondary | ICD-10-CM | POA: Diagnosis not present

## 2022-05-31 DIAGNOSIS — E7800 Pure hypercholesterolemia, unspecified: Secondary | ICD-10-CM | POA: Diagnosis not present

## 2022-05-31 DIAGNOSIS — I6523 Occlusion and stenosis of bilateral carotid arteries: Secondary | ICD-10-CM | POA: Diagnosis not present

## 2022-05-31 DIAGNOSIS — I671 Cerebral aneurysm, nonruptured: Secondary | ICD-10-CM | POA: Diagnosis not present

## 2022-05-31 LAB — LIPID PANEL
Cholesterol: 120 mg/dL (ref 0–200)
HDL: 49 mg/dL (ref >40)
LDL Cholesterol: 55 mg/dL (ref 0–99)
Total CHOL/HDL Ratio: 2.4 RATIO
Triglycerides: 80 mg/dL (ref <150)
VLDL: 16 mg/dL (ref 0–40)

## 2022-05-31 LAB — HEMOGLOBIN A1C
Hgb A1c MFr Bld: 5 % (ref 4.8–5.6)
Mean Plasma Glucose: 96.8 mg/dL

## 2022-05-31 MED ORDER — IOHEXOL 350 MG/ML SOLN
75.0000 mL | Freq: Once | INTRAVENOUS | Status: AC | PRN
  Administered 2022-05-31: 75 mL via INTRAVENOUS

## 2022-05-31 NOTE — Progress Notes (Addendum)
STROKE TEAM PROGRESS NOTE   INTERVAL HISTORY Her family is at the bedside.  Patient states that she is doing "much better" today than yesterday. She has ambulated with PT today without difficulty.   Vitals:   05/30/22 2215 05/30/22 2300 05/31/22 0000 05/31/22 0400  BP: (!) 148/72  (!) 155/75 (!) 146/51  Pulse: (!) 55  60 60  Resp: '13  17 13  '$ Temp:   98.1 F (36.7 C) 98.5 F (36.9 C)  TempSrc:   Oral Oral  SpO2: 97%  100% 99%  Weight:  51.1 kg    Height:  '5\' 4"'$  (1.626 m)     CBC:  Recent Labs  Lab 05/29/22 0655 05/30/22 1241 05/30/22 1249  WBC 5.1 6.9  --   NEUTROABS 3.1 5.0  --   HGB 10.8* 10.6* 11.2*  HCT 34.4* 33.1* 33.0*  MCV 90.8 90.4  --   PLT 263 258  --    Basic Metabolic Panel:  Recent Labs  Lab 05/29/22 0655 05/30/22 1241 05/30/22 1249  NA 141 141 142  K 4.0 3.8 3.8  CL 108 111 107  CO2 27 24  --   GLUCOSE 63* 190* 180*  BUN '16 17 18  '$ CREATININE 0.83 0.88 0.80  CALCIUM 8.5* 8.2*  --    Lipid Panel:  Recent Labs  Lab 05/31/22 0455  CHOL 120  TRIG 80  HDL 49  CHOLHDL 2.4  VLDL 16  LDLCALC 55   HgbA1c:  Recent Labs  Lab 05/31/22 0455  HGBA1C 5.0   Urine Drug Screen: No results for input(s): "LABOPIA", "COCAINSCRNUR", "LABBENZ", "AMPHETMU", "THCU", "LABBARB" in the last 168 hours.  Alcohol Level  Recent Labs  Lab 05/30/22 1241  ETH <10   IMAGING past 24 hours MR ANGIO HEAD WO CONTRAST  Result Date: 05/30/2022 CLINICAL DATA:  Left-sided weakness and facial droop following cerebral arteriogram. Pontine infarct. EXAM: MRA HEAD WITHOUT CONTRAST TECHNIQUE: Angiographic images of the Circle of Willis were acquired using MRA technique without intravenous contrast. COMPARISON:  Cerebral arteriogram 05/29/2022 FINDINGS: Anterior circulation: Internal carotid arteries are within normal limits high cervical segments through the ICA termini bilaterally. Some attenuation proximal A2 segments likely artifact from the coil pack. The A1 and M1 segments  are normal bilaterally. MCA bifurcations are within limits. The ACA and MCA branch vessels are within normal. Posterior circulation: The vertebral arteries are codominant. Vertebrobasilar junction and basilar artery are normal. Prominent AICA vessels are noted bilaterally. Both posterior cerebral arteries originate from basilar tip. The PCA branch vessels are within normal limits. Anatomic variants: None Other: None. IMPRESSION: Normal MRA Circle of Willis. Electronically Signed   By: San Morelle M.D.   On: 05/30/2022 17:35   MR Angiogram Neck W or Wo Contrast  Result Date: 05/30/2022 CLINICAL DATA:  Left-sided weakness and facial droop. Acute brainstem infarct. Status post cerebral arteriogram yesterday. EXAM: MRA NECK WITHOUT AND WITH CONTRAST TECHNIQUE: Multiplanar and multiecho pulse sequences of the neck were obtained without and with intravenous contrast. Angiographic images of the neck were obtained using MRA technique without and with intravenous contrast. CONTRAST:  4.80m GADAVIST GADOBUTROL 1 MMOL/ML IV SOLN COMPARISON:  Cerebral arteriogram 05/29/2022 FINDINGS: The time-of-flight images are severely degraded by patient motion. The postcontrast images demonstrate a common origin of the left common carotid artery and innominate artery. Right common carotid artery is within normal limits. Bifurcation unremarkable. Cervical right ICA is normal. The left common carotid artery is. Bifurcation is within normal limits. Cervical left ICA is  normal. The vertebral arteries are codominant. Both vertebral arteries originate from the subclavian arteries without significant stenosis. No significant stenosis is present in either vertebral artery in the neck. Intracranial vertebral arteries, vertebrobasilar junction and basilar artery are normal. IMPRESSION: 1. Normal MRA of the neck. 2. No acute or focal abnormality to explain brainstem infarct. Electronically Signed   By: San Morelle M.D.   On:  05/30/2022 17:34   MR BRAIN WO CONTRAST  Result Date: 05/30/2022 CLINICAL DATA:  A deficit, acute, stroke suspected. Left-sided weakness facial droop. Cerebral are angiography yesterday. EXAM: MRI HEAD WITHOUT CONTRAST TECHNIQUE: Multiplanar, multiecho pulse sequences of the brain and surrounding structures were obtained without intravenous contrast. COMPARISON:  None Available. FINDINGS: Brain: The diffusion-weighted images demonstrate acute nonhemorrhagic right paramedian pontine infarct measuring up to 2 cm in maximal diameter. Additional punctate foci of restricted diffusion are present in the right cerebellum and right greater than left occipital lobes. No anterior circulation infarcts are present. T2 signal changes are associated with the areas of acute infarct. No other significant white matter disease is present. The ventricles are of normal size. No significant extraaxial fluid collection is present. The internal auditory canals are within normal limits. Vascular: Flow is present in the major intracranial arteries. Skull and upper cervical spine: The craniocervical junction is normal. Upper cervical spine is within normal limits. Marrow signal is unremarkable. Sinuses/Orbits: The paranasal sinuses and mastoid air cells are clear. Bilateral lens replacements are noted. Globes and orbits are otherwise unremarkable. IMPRESSION: 1. 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 2. Additional punctate foci of acute nonhemorrhagic infarct involving the right cerebellum and right greater than left occipital lobes. These results were called by telephone at the time of interpretation on 05/30/2022 at 5:23 pm to provider Dr. Truett Mainland, who verbally acknowledged these results. Electronically Signed   By: San Morelle M.D.   On: 05/30/2022 17:24    PHYSICAL EXAM Neurological Examination Mental Status: Alert, oriented throughout, thought content appropriate. Minimal dysarthria is noted; speech  otherwise is fluent without evidence of aphasia. Able to follow all commands without difficulty. Naming and repetition are intact.  Cranial Nerves: II: Temporal visual fields intact with no extinction to DSS. PERRL  III,IV, VI: No ptosis. EOMI with saccadic quality of visual pursuits noted.  V: Facial sensation to light touch is intact and symmetric VII: Mild left facial droop present VIII: Hearing is intact to voice IX,X: No hoarseness or hypophonia XI: Symmetric shoulder shrug XII: Midline tongue protrusion Motor: RUE and RLE 5/5 LUE 4/5 proximally and distally LLE 4-/5 proximally and distally  Patient has wobbling/minimal vertical drift on examination of left upper and lower extremities Sensory: Temp and light touch intact throughout, bilaterally. No extinction to DSS.  Deep Tendon Reflexes: Deferred Cerebellar: Right side is normal. LUE with ataxia on FNF. LLE with minimal ataxia noted on HKS. Gait: Deferred  ASSESSMENT/PLAN [name] is a 71 y.o. female with history of DM2, colitis, proriatic arthritis, asthma, HTN, anemia, left inguinal hernia s/p repair 12/16/21, ACOM aneurysm embolization in 2022 and arteriovenous malformation. Developed aphasia and weakness after cerebral angiogram on Friday. MRI with evidence of acute right pontine infarction.   Stroke:  acute right paramedian pontine infarction with additional punctate foci of infarction in the right cerebellum and right > left occipital lobes. Etiology is likely embolic from recent cerebral angiogram procedure on 7/28.  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 Yes; Fluid consistency: Thin   No antithrombotic prior to admission, now on No  antithrombotic due to history of severe anemia r/t GIB secondary to AVMs with ASA use in the past.  Therapy recommendations:  pending Disposition:  pending  Hypertension Home meds:  Norvasc 5 mg, Cozaar 100 mg  Stable Permissive hypertension (OK if < 220/120) but gradually normalize in 5-7 days Long-term BP goal normotensive  Hyperlipidemia Home meds:  rosuvastatin 10 mg, resumed in hospital LDL 55, goal < 70 High intensity statin not indicated as cholesterol is controlled with moderate-intensity rosuvastatin dose Continue statin at discharge  Diabetes type II Controlled Home meds:  insulin degludec  HgbA1c 5.0, goal < 7.0 CBGs Recent Labs    05/29/22 0653 05/29/22 0730 05/30/22 1208  GLUCAP 51* 177* 174*    SSI  Other Stroke Risk Factors Advanced Age >/= 41   Other Active Problems 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  Echo still pending.  Due to history of GI bleed no antiplatelets recommended due to etiology of stroke. IR following.  Dr. Reeves Forth evaluated pt independently, reviewed imaging, chart, labs. Discussed and formulated plan with the APP. Please see APP note above for details.    Elea Holtzclaw,MD   To contact Stroke Continuity provider, please refer to [email or web address] After hours, contact General Neurology

## 2022-05-31 NOTE — Assessment & Plan Note (Signed)
Allowing for primary hypertension

## 2022-05-31 NOTE — Progress Notes (Signed)
PT Cancellation Note  Patient Details Name: Ashley Gross MRN: [id number] DOB: 07/18/1951   Cancelled Treatment:    Reason Eval/Treat Not Completed: Patient at procedure or test/unavailable  Transport arrived just as we began for PT eval;   Will follow up later today as time allows;  Otherwise, will follow up for PT tomorrow;   Thank you,  Roney Marion, [place] [facility] [place] 05/31/2022, 9:51 AM

## 2022-05-31 NOTE — Progress Notes (Addendum)
IR aware of patient admission for pontine stroke post diagnostic cerebral angiogram as an outpatient 7/28 in NIR.   Stopped by patient's room today to discuss recent events, assess symptoms and answer questions. [name] states she is doing much better today, her speech has greatly improved and she worked with PT earlier where she noted her left leg weakness to be much improved as well. She denies any new neurologic symptoms. She is hopeful that she will not need rehab after this admission.   Dr. Karenann Cai will be in to see patient on 7/31 - patient and family aware and agreeable to this. They deny any needs at this time.  No new NIR recommendations at this time, greatly appreciate neurology's assistance with this patient.   Please call with questions or concerns.  Candiss Norse, PA-C

## 2022-05-31 NOTE — Evaluation (Signed)
Occupational Therapy Evaluation Patient Details Name: Ashley Gross MRN: [id number] DOB: Mar 31, 1951 Today's Date: 05/31/2022   History of Present Illness Ashley Gross is a 71 y.o. 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 Pt currently mod I -min A for ADLs, supervision for bed mobility, and min guard for transfers with RW. Pt with LUE weakness and decreased coordination compared to R, however able to use functionally for seated ADL. Pt  presenting with impairments listed below, will follow acutely. Recommend HHOT at d/c.      Recommendations for follow up therapy are one component of a multi-disciplinary discharge planning process, led by the attending physician.  Recommendations may be updated based on patient status, additional functional criteria and insurance authorization.   Follow Up Recommendations  Home health OT    Assistance Recommended at Discharge Set up Supervision/Assistance  Patient can return home with  the following A little help with walking and/or transfers;A little help with bathing/dressing/bathroom;Assistance with cooking/housework;Direct supervision/assist for medications management;Direct supervision/assist for financial management;Help with stairs or ramp for entrance;Assist for transportation    Functional Status Assessment  Patient has had a recent decline in their functional status and demonstrates the ability to make significant improvements in function in a reasonable and predictable amount of time.  Equipment Recommendations  Other (comment) (pt has all needed DME)    Recommendations for Other Services PT consult     Precautions / Restrictions Precautions Precautions: Fall Precaution Comments: Fall risk is significantly lessened with use of RW Restrictions Weight Bearing Restrictions: No      Mobility Bed Mobility Overal bed mobility: Needs Assistance Bed Mobility: Supine to Sit, Sit to Supine     Supine to sit: Supervision Sit to supine: Supervision        Transfers Overall transfer level: Needs assistance Equipment used: Rolling walker (2 wheels) Transfers: Sit to/from Stand Sit to Stand: Min guard                  Balance Overall balance assessment: Needs assistance Sitting-balance support: Bilateral upper extremity supported, Feet supported Sitting balance-Leahy Scale: Good     Standing balance support: During functional activity, Reliant on assistive device for balance Standing balance-Leahy Scale: Poor                             ADL either performed or assessed with clinical judgement   ADL Overall ADL's : Needs assistance/impaired Eating/Feeding: Modified independent;Sitting   Grooming: Min guard;Standing   Upper Body Bathing: Minimal assistance;Sitting   Lower Body Bathing: Minimal assistance;Sit to/from stand;Sitting/lateral leans   Upper Body Dressing : Minimal assistance;Sitting   Lower Body  Dressing: Minimal  assistance;Sitting/lateral leans;Sit to/from stand Lower Body Dressing Details (indicate cue type and reason): pulling up socks at EOB using figure 4 Toilet Transfer: Rolling walker (2 wheels);Min guard;Ambulation;Regular Glass blower/designer Details (indicate cue type and reason): simulated in room         Functional mobility during ADLs: Min guard;Rolling walker (2 wheels)       Vision Baseline Vision/History: 1 Wears glasses Additional Comments: vision WFL for basic ADL/mobility tasks, will further assess     Perception     Praxis      Pertinent Vitals/Pain Pain Assessment Pain Assessment: No/denies pain     Hand Dominance Right   Extremity/Trunk Assessment Upper Extremity Assessment Upper Extremity Assessment: LUE deficits/detail;Generalized weakness LUE Deficits / Details: 3/5 globally, decreased coordination/fine motor skills LUE Sensation: WNL LUE Coordination: decreased fine motor;decreased gross motor   Lower Extremity Assessment Lower Extremity Assessment: Defer to PT evaluation LLE Deficits / Details: Decr coordination; decr single limb stance stability, leading to teh need for UE support to maintain balance; Mild strength deficits compared to RLE LLE Coordination: decreased gross motor;decreased fine motor   Cervical / Trunk Assessment Cervical / Trunk Assessment: Normal   Communication Communication Communication: No difficulties   Cognition Arousal/Alertness: Awake/alert Behavior During Therapy: WFL for tasks assessed/performed Overall Cognitive Status: Within Functional Limits for tasks assessed                                 General Comments: A & O x4, no errors when administering SBT     General Comments  VSS on RA, family present in room    Exercises     Shoulder Instructions      Home Living Family/patient expects to be discharged to:: Private residence Living Arrangements: Alone Available Help at Discharge:  Family;Available 24 hours/day Type of Home: House Home Access: Stairs to enter [employer] of Steps: 4 Entrance Stairs-Rails: Can reach both Home Layout: One level     Bathroom Shower/Tub: Occupational psychologist: Handicapped height Bathroom Accessibility: Yes How Accessible: Accessible via walker Home Equipment: Kreamer in;Rolling Environmental consultant (2 wheels)   Additional Comments: plans to move in with children in august      Prior Functioning/Environment Prior Level of Function : Independent/Modified Independent             Mobility Comments: no AD use ADLs Comments: Drives; works, Education officer, 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        OT Problem List: Decreased strength;Decreased range of motion;Decreased activity tolerance;Impaired balance (sitting and/or standing);Decreased coordination;Decreased safety awareness      OT Treatment/Interventions: Self-care/ADL training;Therapeutic exercise;Therapeutic activities;Patient/family education;Balance training;Energy conservation;DME and/or AE instruction    OT Goals(Current goals can be found in the care plan section) Acute Rehab OT Goals Patient Stated Goal: none stated OT Goal Formulation: With patient Time For Goal Achievement: 06/14/22 Potential to Achieve Goals: Good ADL Goals Pt Will Perform Upper Body Dressing: with modified independence;sitting;standing Pt Will Perform Lower Body Dressing: with modified independence;sit to/from stand;sitting/lateral leans Pt Will Transfer to Toilet: with supervision;ambulating;regular height toilet Pt Will Perform Tub/Shower Transfer: Shower transfer;ambulating;shower seat;rolling walker Pt/caregiver will Perform Home Exercise Program: Left upper extremity;With written HEP provided;Independently Additional ADL Goal #1: Pt will perform 3 step trailmaking task in prep for ADLs  OT Frequency: Min 2X/week    Co-evaluation  AM-PAC OT "6 Clicks" Daily  Activity     Outcome Measure Help from another person eating meals?: None Help from another person taking care of personal grooming?: A Little Help from another person toileting, which includes using toliet, bedpan, or urinal?: A Little Help from another person bathing (including washing, rinsing, drying)?: A Little Help from another person to put on and taking off regular upper body clothing?: A Little Help from another person to put on and taking off regular lower body clothing?: A Little 6 Click Score: 19   End of Session Equipment Utilized During Treatment: Gait belt;Rolling walker (2 wheels) Nurse Communication: Mobility status  Activity Tolerance: Patient tolerated treatment well Patient left: in bed;with call bell/phone within reach;with bed alarm set;with family/visitor present  OT Visit Diagnosis: Unsteadiness on feet (R26.81);Other abnormalities of gait and mobility (R26.89);Muscle weakness (generalized) (M62.81)                Time: 3875-6433 OT Time Calculation (min): 27 min Charges:  OT General Charges $OT Visit: 1 Visit OT Evaluation $OT Eval Moderate Complexity: 1 Mod OT Treatments $Therapeutic Activity: 8-22 mins  Lynnda Child, OTD, OTR/L Acute Rehab [phone]) [phone] - [place] 05/31/2022, 4:28 PM

## 2022-05-31 NOTE — Progress Notes (Signed)
PROGRESS NOTE    Ashley Gross  TKZ:[id number] DOB: 04/20/1951 DOA: 05/30/2022 PCP: Einar Pheasant, MD    Chief Complaint  Patient presents with   Weakness   Stroke Symptoms    Brief Narrative:    Ashley Gross is a 71 y.o. 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    Pt underwent routine diagnostic cerebral angiogram of previous embolized anterior communicating artery on 7/28.  Shortly after the procedure she had brief double vision, ongoing home she had difficulty with her gait and was stumbling when she was previously walking independently.  Also was having slurred speech.  They discussed with RN and was told it was likely due to medication for the procedure.  However, today she continued to have slurred speech and began to notice left facial droop.  Family then concerned about stroke given brought her [facility] to [facility].   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 Also additional punctate foci of acute infarct in the right cerebellum and right greater than left occipital lobes. MRA head and neck were negative for acute findings.    -Patient was admitted for further work-up.   Assessment & Plan:   Principal Problem:   Stroke [facility]) Active Problems:   Hypertension   GERD (gastroesophageal reflux disease)   Hypercholesterolemia   Controlled type 2 diabetes mellitus with complication, with long-term current use of insulin (HCC)   Aneurysm of anterior communicating artery  Acute CVA -Evidence of embolic CVA -MRA head and neck were negative -Management per stroke team. -PT/OT/SLP is pending. -Status post diagnostic cerebral  angiogram 7/28.   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  Has 1 year MRI angiogram follow-up.    Hypercholesterolemia Continue statin.   GERD (gastroesophageal reflux disease) Continue home omeprazole   Hypertension Allowing for primary hypertension    DVT prophylaxis: Lovenox Code Status: Full Family Communication: none at bedside Disposition:   Status is: Observation     Consultants:  Neurology   Subjective:  She denies any worsening of her focal deficits.  Objective: Vitals:   05/30/22 2215 05/30/22 2300 05/31/22 0000 05/31/22 0400  BP: (!) 148/72  (!) 155/75 (!) 146/51  Pulse: (!) 55  60 60  Resp: '13  17 13  '$ Temp:   98.1 F (36.7 C) 98.5 F (36.9 C)  TempSrc:   Oral Oral  SpO2: 97%  100% 99%  Weight:  51.1 kg    Height:  '5\' 4"'$  (1.626 m)     No intake or output data in the 24 hours ending 05/31/22 1036 Filed Weights   05/30/22 2300  Weight: 51.1 kg    Examination:  Awake Alert, Oriented X 3, No new F.N deficits, Normal affect Left facial droop, dysmetria and drift Symmetrical Chest wall movement, Good air movement bilaterally, CTAB RRR,No Gallops,Rubs or new Murmurs, No Parasternal Heave +ve B.Sounds, Abd Soft, No tenderness, No rebound - guarding or rigidity. No Cyanosis, Clubbing or edema, No new Rash or bruise      Data Reviewed: I have personally reviewed following labs and imaging studies  CBC: Recent Labs  Lab 05/29/22 0655 05/30/22 1241 05/30/22 1249  WBC 5.1 6.9  --  NEUTROABS 3.1 5.0  --   HGB 10.8* 10.6* 11.2*  HCT 34.4* 33.1* 33.0*  MCV 90.8 90.4  --   PLT 263 258  --     Basic Metabolic Panel: Recent Labs  Lab 05/29/22 0655 05/30/22 1241 05/30/22 1249  NA 141 141 142  K 4.0 3.8 3.8  CL 108 111 107  CO2 27 24  --   GLUCOSE 63* 190* 180*  BUN '16 17 18  '$ CREATININE 0.83  0.88 0.80  CALCIUM 8.5* 8.2*  --     GFR: Estimated Creatinine Clearance: 52 mL/min (by C-G formula based on SCr of 0.8 mg/dL).  Liver Function Tests: Recent Labs  Lab 05/30/22 1241  AST 28  ALT 23  ALKPHOS 78  BILITOT 0.2*  PROT 5.2*  ALBUMIN 3.0*    CBG: Recent Labs  Lab 05/29/22 0653 05/29/22 0730 05/30/22 1208  GLUCAP 51* 177* 174*     Recent Results (from the past 240 hour(s))  Resp Panel by RT-PCR (Flu A&B, Covid) Anterior Nasal Swab     Status: None   Collection Time: 05/30/22 12:21 PM   Specimen: Anterior Nasal Swab  Result Value Ref Range Status   SARS Coronavirus 2 by RT PCR NEGATIVE NEGATIVE Final    Comment: (NOTE) SARS-CoV-2 target nucleic acids are NOT DETECTED.  The SARS-CoV-2 RNA is generally detectable in upper respiratory specimens during the acute phase of infection. The lowest concentration of SARS-CoV-2 viral copies this assay can detect is 138 copies/mL. A negative result does not preclude SARS-Cov-2 infection and should not be used as the sole basis for treatment or other patient management decisions. A negative result may occur with  improper specimen collection/handling, submission of specimen other than nasopharyngeal swab, presence of viral mutation(s) within the areas targeted by this assay, and inadequate number of viral copies(<138 copies/mL). A negative result must be combined with clinical observations, patient history, and epidemiological information. The expected result is Negative.  Fact Sheet for Patients:  EntrepreneurPulse.com.au  Fact Sheet for Healthcare Providers:  IncredibleEmployment.be  This test is no t yet approved or cleared by the [place] FDA and  has been authorized for detection and/or diagnosis of SARS-CoV-2 by FDA under an Emergency Use Authorization (EUA). This EUA will remain  in effect (meaning this test can be used) for the duration of the COVID-19 declaration  under Section 564(b)(1) of the Act, 21 U.S.C.section 360bbb-3(b)(1), unless the authorization is terminated  or revoked sooner.       Influenza A by PCR NEGATIVE NEGATIVE Final   Influenza B by PCR NEGATIVE NEGATIVE Final    Comment: (NOTE) The Xpert Xpress SARS-CoV-2/FLU/RSV plus assay is intended as an aid in the diagnosis of influenza from Nasopharyngeal swab specimens and should not be used as a sole basis for treatment. Nasal washings and aspirates are unacceptable for Xpert Xpress SARS-CoV-2/FLU/RSV testing.  Fact Sheet for Patients: EntrepreneurPulse.com.au  Fact Sheet for Healthcare Providers: IncredibleEmployment.be  This test is not yet approved or cleared by the [place] FDA and has been authorized for detection and/or diagnosis of SARS-CoV-2 by FDA under an Emergency Use Authorization (EUA). This EUA will remain in effect (meaning this test can be used) for the duration of the COVID-19 declaration under Section 564(b)(1) of the Act, 21 U.S.C. section 360bbb-3(b)(1), unless the authorization is terminated or revoked.  Performed at [facility], [place] [address] [place], [place] [address]          Radiology Studies: MR ANGIO  HEAD WO CONTRAST  Result Date: 05/30/2022 CLINICAL DATA:  Left-sided weakness and facial droop following cerebral arteriogram. Pontine infarct. EXAM: MRA HEAD WITHOUT CONTRAST TECHNIQUE: Angiographic images of the Circle of Willis were acquired using MRA technique without intravenous contrast. COMPARISON:  Cerebral arteriogram 05/29/2022 FINDINGS: Anterior circulation: Internal carotid arteries are within normal limits high cervical segments through the ICA termini bilaterally. Some attenuation proximal A2 segments likely artifact from the coil pack. The A1 and M1 segments are normal bilaterally. MCA bifurcations are within limits. The ACA and MCA branch vessels are within normal. Posterior  circulation: The vertebral arteries are codominant. Vertebrobasilar junction and basilar artery are normal. Prominent AICA vessels are noted bilaterally. Both posterior cerebral arteries originate from basilar tip. The PCA branch vessels are within normal limits. Anatomic variants: None Other: None. IMPRESSION: Normal MRA Circle of Willis. Electronically Signed   By: San Morelle M.D.   On: 05/30/2022 17:35   MR Angiogram Neck W or Wo Contrast  Result Date: 05/30/2022 CLINICAL DATA:  Left-sided weakness and facial droop. Acute brainstem infarct. Status post cerebral arteriogram yesterday. EXAM: MRA NECK WITHOUT AND WITH CONTRAST TECHNIQUE: Multiplanar and multiecho pulse sequences of the neck were obtained without and with intravenous contrast. Angiographic images of the neck were obtained using MRA technique without and with intravenous contrast. CONTRAST:  4.12m GADAVIST GADOBUTROL 1 MMOL/ML IV SOLN COMPARISON:  Cerebral arteriogram 05/29/2022 FINDINGS: The time-of-flight images are severely degraded by patient motion. The postcontrast images demonstrate a common origin of the left common carotid artery and innominate artery. Right common carotid artery is within normal limits. Bifurcation unremarkable. Cervical right ICA is normal. The left common carotid artery is. Bifurcation is within normal limits. Cervical left ICA is normal. The vertebral arteries are codominant. Both vertebral arteries originate from the subclavian arteries without significant stenosis. No significant stenosis is present in either vertebral artery in the neck. Intracranial vertebral arteries, vertebrobasilar junction and basilar artery are normal. IMPRESSION: 1. Normal MRA of the neck. 2. No acute or focal abnormality to explain brainstem infarct. Electronically Signed   By: CSan MorelleM.D.   On: 05/30/2022 17:34   MR BRAIN WO CONTRAST  Result Date: 05/30/2022 CLINICAL DATA:  A deficit, acute, stroke suspected.  Left-sided weakness facial droop. Cerebral are angiography yesterday. EXAM: MRI HEAD WITHOUT CONTRAST TECHNIQUE: Multiplanar, multiecho pulse sequences of the brain and surrounding structures were obtained without intravenous contrast. COMPARISON:  None Available. FINDINGS: Brain: The diffusion-weighted images demonstrate acute nonhemorrhagic right paramedian pontine infarct measuring up to 2 cm in maximal diameter. Additional punctate foci of restricted diffusion are present in the right cerebellum and right greater than left occipital lobes. No anterior circulation infarcts are present. T2 signal changes are associated with the areas of acute infarct. No other significant white matter disease is present. The ventricles are of normal size. No significant extraaxial fluid collection is present. The internal auditory canals are within normal limits. Vascular: Flow is present in the major intracranial arteries. Skull and upper cervical spine: The craniocervical junction is normal. Upper cervical spine is within normal limits. Marrow signal is unremarkable. Sinuses/Orbits: The paranasal sinuses and mastoid air cells are clear. Bilateral lens replacements are noted. Globes and orbits are otherwise unremarkable. IMPRESSION: 1. 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 2. Additional punctate foci of acute nonhemorrhagic infarct involving the right cerebellum and right greater than left occipital lobes. These results were called by telephone at the time of interpretation on 05/30/2022 at 5:23 pm to  provider Dr. Truett Mainland, who verbally acknowledged these results. Electronically Signed   By: San Morelle M.D.   On: 05/30/2022 17:24        Scheduled Meds:   stroke: early stages of recovery book   Does not apply Once   cholecalciferol  2,000 Units Oral Daily   enoxaparin (LOVENOX) injection  40 mg Subcutaneous Q24H   gabapentin  400 mg Oral QHS   melatonin  10 mg Oral QHS   pantoprazole  40  mg Oral Daily   rosuvastatin  10 mg Oral Daily   Continuous Infusions:   LOS: 0 days      Phillips Climes, MD [facility]   To contact the attending provider between 7A-7P or the covering provider during after hours 7P-7A, please log into the web site [email or web address] and access using universal [place] password for that web site. If you do not have the password, please call the hospital operator.  05/31/2022, 10:36 AM

## 2022-05-31 NOTE — Evaluation (Signed)
Physical Therapy Evaluation Patient Details Name: Ashley Gross MRN: [id number] DOB: 1951/09/12 Today's Date: 05/31/2022  History of Present Illness  Ashley Gross is a 71 y.o. 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 Pt will benefit from skilled PT to increase their independence and safety with mobility to allow discharge to the venue listed below.          Recommendations for follow up therapy are one component of a multi-disciplinary discharge planning process, led by the attending physician.  Recommendations may be updated based on patient status, additional functional  criteria and insurance authorization.  Follow Up Recommendations Home health PT      Assistance Recommended at Discharge Set up Supervision/Assistance  Patient can return home with the following  A little help with walking and/or transfers;Assist for transportation    Equipment Recommendations Rolling walker (2 wheels);BSC/3in1 (Might be able to use her husband's old equipment)  Recommendations for Other Services       Functional Status Assessment Patient has had a recent decline in their functional status and demonstrates the ability to make significant improvements in function in a reasonable and predictable amount of time.     Precautions / Restrictions Precautions Precautions: Fall Precaution Comments: Fall risk is significantly lessened with use of RW Restrictions Weight Bearing Restrictions: No      Mobility  Bed Mobility Overal bed mobility: Needs Assistance Bed Mobility: Supine to Sit     Supine to sit: Supervision     General bed mobility comments: incr time    Transfers Overall transfer level: Needs assistance Equipment used: None, 1 person hand held assist Transfers: Sit to/from Stand Sit to Stand: Min assist           General transfer comment: Min assist to steady    Ambulation/Gait Ambulation/Gait assistance: Min assist Gait Distance (Feet): 12 Feet Assistive device: 1 person hand held assist Gait Pattern/deviations: Decreased stance time - left, Decreased step length - right       General Gait Details: Dependent on UE support for balance with in room ambulation  Stairs            Wheelchair Mobility    Modified Rankin (Stroke Patients Only) Modified Rankin (Stroke Patients Only) Pre-Morbid Rankin Score: No symptoms Modified Rankin: Moderate disability     Balance Overall balance assessment: Needs  assistance   Sitting balance-Leahy Scale: Good       Standing balance-Leahy Scale: Poor                                Pertinent Vitals/Pain Pain Assessment Pain Assessment: No/denies pain (Simultaneous filing. User may not have seen previous data.)    Home Living Family/patient expects to be discharged to:: Private residence (Simultaneous filing. User may not have seen previous data.) Living Arrangements: Alone (moving in with children a  Simultaneous filing. User may not have seen previous data.) Available Help at Discharge: Family;Available 24 hours/day (Simultaneous filing. User may not have seen previous data.) Type of Home: House (Simultaneous filing. User may not have seen previous data.) Home Access: Stairs to enter (Simultaneous filing. User may not have seen previous data.) Entrance Stairs-Rails: Can reach both Entrance Stairs-Number of Steps: 4 (Simultaneous filing. User may not have seen previous data.)   Home Layout: One level (Simultaneous filing. User may not have seen previous data.) Home Equipment: Shower seat - built Medical sales representative (2 wheels) (Simultaneous filing. User may not have seen previous data.) Additional Comments: plans to move in with children until august    Prior Function Prior Level of Function : Independent/Modified Independent;Driving (Simultaneous filing. User may not have seen previous data.)             Mobility Comments: no AD use (Simultaneous filing. 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 User may not have seen previous data.)     Hand Dominance   Dominant Hand: Right (Simultaneous filing. User may not have seen previous data.)    Extremity/Trunk Assessment   Upper Extremity Assessment Upper Extremity Assessment: Defer to OT evaluation    Lower Extremity Assessment Lower Extremity Assessment: LLE deficits/detail LLE Deficits / Details: Decr coordination; decr single limb stance stability, leading to teh need for UE support to maintain balance; Mild strength  deficits compared to RLE LLE Coordination: decreased gross motor;decreased fine motor    Cervical / Trunk Assessment Cervical / Trunk Assessment: Normal  Communication   Communication: No difficulties (reports is much better than on admission  Simultaneous filing. User may not have seen previous data.)  Cognition Arousal/Alertness: Awake/alert (Simultaneous filing. User may not have seen previous data.) Behavior During Therapy: [facility] for tasks assessed/performed Overall Cognitive Status: Within Functional Limits for tasks assessed                                          General Comments General comments (skin integrity, edema, etc.): Discussed pt's progress compared to yesterday, and anticipated progress; VSS on RA    Exercises     Assessment/Plan    PT Assessment Patient needs continued PT services  PT Problem List Decreased strength;Decreased activity tolerance;Decreased balance;Decreased mobility;Decreased coordination;Decreased knowledge of use of DME;Impaired sensation       PT Treatment Interventions DME instruction;Gait training;Stair training;Functional mobility training;Therapeutic activities;Therapeutic exercise;Balance training;Neuromuscular re-education;Patient/family education    PT Goals (Current goals can be found in the Care Plan section)  Acute Rehab PT Goals Patient Stated Goal: back to independence PT Goal Formulation: With patient Time For Goal Achievement: 06/14/22 Potential to Achieve Goals: Good    Frequency Min 4X/week     Co-evaluation  AM-PAC PT "6 Clicks" Mobility  Outcome Measure Help needed turning from your back to your side while in a flat bed without using bedrails?: None Help needed moving from lying on your back to sitting on the side of a flat bed without using bedrails?: A Little Help needed moving to and from a bed to a chair (including a wheelchair)?: A Little Help needed standing up from a chair  using your arms (e.g., wheelchair or bedside chair)?: A Little Help needed to walk in hospital room?: A Little Help needed climbing 3-5 steps with a railing? : A Lot 6 Click Score: 18    End of Session Equipment Utilized During Treatment: Gait belt Activity Tolerance: Patient tolerated treatment well Patient left: in bed;with call bell/phone within reach;with bed alarm set (bed in semi-chair position) Nurse Communication: Mobility status PT Visit Diagnosis: Hemiplegia and hemiparesis Hemiplegia - Right/Left: Left Hemiplegia - dominant/non-dominant: Non-dominant Hemiplegia - caused by: Cerebral infarction    Time: 7282-0601 PT Time Calculation (min) (ACUTE ONLY): 40 min   Charges:   PT Evaluation $PT Eval Moderate Complexity: 1 Mod PT Treatments $Gait Training: 8-22 mins $Therapeutic Activity: 8-22 mins        Roney Marion, PT  Acute Rehabilitation Services Office [phone]   [place] 05/31/2022, 3:13 PM

## 2022-06-01 ENCOUNTER — Observation Stay (HOSPITAL_BASED_OUTPATIENT_CLINIC_OR_DEPARTMENT_OTHER): Payer: PPO

## 2022-06-01 DIAGNOSIS — Z794 Long term (current) use of insulin: Secondary | ICD-10-CM | POA: Diagnosis not present

## 2022-06-01 DIAGNOSIS — E118 Type 2 diabetes mellitus with unspecified complications: Secondary | ICD-10-CM | POA: Diagnosis not present

## 2022-06-01 DIAGNOSIS — E7800 Pure hypercholesterolemia, unspecified: Secondary | ICD-10-CM | POA: Diagnosis not present

## 2022-06-01 DIAGNOSIS — I6389 Other cerebral infarction: Secondary | ICD-10-CM

## 2022-06-01 DIAGNOSIS — I6349 Cerebral infarction due to embolism of other cerebral artery: Secondary | ICD-10-CM | POA: Diagnosis not present

## 2022-06-01 DIAGNOSIS — Z9889 Other specified postprocedural states: Secondary | ICD-10-CM | POA: Diagnosis not present

## 2022-06-01 DIAGNOSIS — I671 Cerebral aneurysm, nonruptured: Secondary | ICD-10-CM | POA: Diagnosis not present

## 2022-06-01 LAB — CBC
HCT: 30.5 % — ABNORMAL LOW (ref 36.0–46.0)
Hemoglobin: 9.6 g/dL — ABNORMAL LOW (ref 12.0–15.0)
MCH: 28.2 pg (ref 26.0–34.0)
MCHC: 31.5 g/dL (ref 30.0–36.0)
MCV: 89.4 fL (ref 80.0–100.0)
Platelets: 208 10*3/uL (ref 150–400)
RBC: 3.41 MIL/uL — ABNORMAL LOW (ref 3.87–5.11)
RDW: 21.2 % — ABNORMAL HIGH (ref 11.5–15.5)
WBC: 5.4 10*3/uL (ref 4.0–10.5)
nRBC: 0 % (ref 0.0–0.2)

## 2022-06-01 LAB — BASIC METABOLIC PANEL
Anion gap: 9 (ref 5–15)
BUN: 15 mg/dL (ref 8–23)
CO2: 25 mmol/L (ref 22–32)
Calcium: 8.4 mg/dL — ABNORMAL LOW (ref 8.9–10.3)
Chloride: 107 mmol/L (ref 98–111)
Creatinine, Ser: 0.87 mg/dL (ref 0.44–1.00)
GFR, Estimated: >60 mL/min (ref >60)
Glucose, Bld: 86 mg/dL (ref 70–99)
Potassium: 3.8 mmol/L (ref 3.5–5.1)
Sodium: 141 mmol/L (ref 135–145)

## 2022-06-01 LAB — ECHOCARDIOGRAM COMPLETE
Area-P 1/2: 1.55 cm2
Height: 64 in
S' Lateral: 2.1 cm
Weight: 1802.48 oz

## 2022-06-01 LAB — GLUCOSE, CAPILLARY
Glucose-Capillary: 185 mg/dL — ABNORMAL HIGH (ref 70–99)
Glucose-Capillary: 144 mg/dL — ABNORMAL HIGH (ref 70–99)

## 2022-06-01 MED ORDER — LOSARTAN POTASSIUM 100 MG PO TABS: 100.0000 mg | ORAL_TABLET | Freq: Every day | ORAL | 1 refills | Status: DC

## 2022-06-01 MED ORDER — INSULIN ASPART 100 UNIT/ML IJ SOLN: 0.0000 [IU] | Freq: Every day | INTRAMUSCULAR | Status: DC

## 2022-06-01 MED ORDER — INSULIN ASPART 100 UNIT/ML IJ SOLN
0.0000 [IU] | Freq: Three times a day (TID) | INTRAMUSCULAR | Status: DC
  Administered 2022-06-01 (×2): 1 [IU] via SUBCUTANEOUS

## 2022-06-01 MED ORDER — ASPIRIN 81 MG PO TBEC: 81.0000 mg | DELAYED_RELEASE_TABLET | Freq: Every day | ORAL | Status: DC

## 2022-06-01 MED ORDER — AMLODIPINE BESYLATE 5 MG PO TABS: 5.0000 mg | ORAL_TABLET | Freq: Two times a day (BID) | ORAL | 1 refills | Status: DC

## 2022-06-01 MED ORDER — FERROUS SULFATE 325 (65 FE) MG PO TABS
325.0000 mg | ORAL_TABLET | Freq: Two times a day (BID) | ORAL | Status: DC
  Administered 2022-06-01: 325 mg via ORAL
  Filled 2022-06-01: qty 1

## 2022-06-01 MED ORDER — DOCUSATE SODIUM 100 MG PO CAPS
100.0000 mg | ORAL_CAPSULE | Freq: Two times a day (BID) | ORAL | Status: DC
  Administered 2022-06-01: 100 mg via ORAL
  Filled 2022-06-01: qty 1

## 2022-06-01 NOTE — Progress Notes (Signed)
Occupational Therapy Treatment Patient Details Name: Ashley Gross MRN: [id number] DOB: 1951/01/24 Today's Date: 06/01/2022   History of present illness Ashley Gross is a 71 y.o. 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 Pt min guard for ADLs, supervision for bed mobility, and min guard for transfers with RW. Pt presenting with impairments listed below, will follow acutely. Continue to recommend HHOT at d/c.   Recommendations for follow up therapy are one component of a multi-disciplinary discharge planning process, led by the attending physician.  Recommendations may be updated based on patient status, additional functional criteria and insurance authorization.    Follow Up Recommendations  Home health OT    Assistance Recommended at Discharge Set up Supervision/Assistance  Patient can return home with the following  A little help with walking and/or transfers;A little help with bathing/dressing/bathroom;Assistance with  cooking/housework;Direct supervision/assist for medications management;Direct supervision/assist for financial management;Help with stairs or ramp for entrance;Assist for transportation   Equipment Recommendations  None recommended by OT (pt has all needed DME)    Recommendations for Other Services PT consult    Precautions / Restrictions Precautions Precautions: Fall Precaution Comments: Fall risk is significantly lessened with use of RW Restrictions Weight Bearing Restrictions: No       Mobility Bed Mobility Overal bed mobility: Needs Assistance Bed Mobility: Supine to Sit, Sit to Supine     Supine to sit: Supervision Sit to supine: Supervision   General bed mobility comments: incr time    Transfers Overall transfer level: Needs assistance Equipment used: Rolling walker (2 wheels) Transfers: Sit to/from Stand Sit to Stand: Min guard                 Balance Overall balance assessment: Needs assistance Sitting-balance support: Bilateral upper extremity supported, Feet supported Sitting balance-Leahy Scale: Good     Standing balance support: During functional activity, Reliant on assistive device for balance Standing balance-Leahy Scale: Poor                             ADL either performed or assessed with clinical judgement   ADL Overall ADL's : Needs assistance/impaired     Grooming: Min guard;Standing Grooming Details (indicate cue type and reason): brushing teeth at sink             Lower Body Dressing: Min guard;Sitting/lateral leans Lower Body Dressing Details (indicate cue type and reason): donning/doffing socks             Functional mobility during ADLs: Min guard;Rolling walker (2 wheels)      Extremity/Trunk Assessment Upper Extremity Assessment Upper Extremity Assessment: Overall WFL for tasks assessed LUE Deficits /  Details: 3/5 globally, decreased coordination/fine motor skills LUE Sensation: WNL LUE  Coordination: decreased fine motor;decreased gross motor   Lower Extremity Assessment Lower Extremity Assessment: Defer to PT evaluation        Vision   Additional Comments: WFL, reports cleared to drive by neurologist   Perception Perception Perception: Not tested   Praxis Praxis Praxis: Not tested    Cognition Arousal/Alertness: Awake/alert Behavior During Therapy: WFL for tasks assessed/performed Overall Cognitive Status: Within Functional Limits for tasks assessed                                 General Comments: A & O x4, no errors when administering SBT        Exercises Exercises: General Upper Extremity, Hand exercises, Other exercises General Exercises - Upper Extremity Shoulder Flexion: AROM, Left, 5 reps, Seated Elbow Flexion: AROM, Left, 5 reps, Seated Elbow Extension: AROM, Left, 5 reps, Seated Wrist Flexion: AROM, Left, 5 reps, Seated Wrist Extension: AROM, Left, 5 reps, Seated Digit Composite Flexion: AROM, Left, 5 reps, Seated Composite Extension: AROM, Left, 5 reps, Seated Hand Exercises Forearm Supination: AROM, Left, 5 reps, Seated Forearm Pronation: AROM, Left, 5 reps, Seated Other Exercises Other Exercises: pronated isolated digit extension x1 LUE Other Exercises: digit abduction LUE x10 Other Exercises: digit adduction LUE x10 Other Exercises: digit opposition LUE x10    Shoulder Instructions       General Comments VSS on RA    Pertinent Vitals/ Pain       Pain Assessment Pain Assessment: No/denies pain  Home Living                                          Prior Functioning/Environment              Frequency  Min 2X/week        Progress Toward Goals  OT Goals(current goals can now be found in the care plan section)  Progress towards OT goals: Progressing toward goals  Acute Rehab OT Goals Patient Stated Goal: none stated OT Goal Formulation: With patient Time For Goal Achievement:  06/14/22 Potential to Achieve Goals: Good ADL Goals Pt Will Perform Upper Body Dressing: with modified independence;sitting;standing Pt Will Perform Lower Body Dressing: with modified independence;sit to/from stand;sitting/lateral leans Pt Will Transfer to Toilet: with supervision;ambulating;regular height toilet Pt Will Perform Tub/Shower Transfer: Shower transfer;ambulating;shower seat;rolling walker Pt/caregiver will Perform Home Exercise Program: Left upper extremity;With written HEP provided;Independently Additional ADL Goal #1: Pt will perform 3 step trailmaking task in prep for ADLs  Plan Discharge plan remains appropriate;Frequency remains appropriate    Co-evaluation                 AM-PAC OT "6 Clicks" Daily Activity     Outcome Measure   Help from another person eating meals?: None Help from another person taking care of personal grooming?: None Help from another person toileting, which includes using toliet, bedpan, or urinal?: A Little Help from another person bathing (including washing, rinsing, drying)?: A Little Help from another person to put on and taking off regular upper body clothing?: A Little Help from another person to put on and taking off regular lower body clothing?: A Little 6 Click Score: 20    End of Session Equipment Utilized During Treatment: Gait belt;Rolling walker (2 wheels)  OT Visit Diagnosis: Unsteadiness on feet (R26.81);Other abnormalities of gait and mobility (R26.89);Muscle weakness (generalized) (M62.81)   Activity Tolerance Patient tolerated treatment well   Patient Left in bed;with call bell/phone within reach;with bed alarm set;with family/visitor present   Nurse Communication Mobility status        Time: 6825-7493 OT Time Calculation (min): 22 min  Charges: OT General Charges $OT Visit: 1 Visit OT Treatments $Therapeutic Activity: 8-22 mins  Lynnda Child, OTD, OTR/L Acute Rehab [phone] - [place] 06/01/2022, 10:00 AM

## 2022-06-01 NOTE — Progress Notes (Signed)
  Echocardiogram 2D Echocardiogram has been performed.  Darlina Sicilian M 06/01/2022, 9:18 AM

## 2022-06-01 NOTE — Progress Notes (Addendum)
Referring Physician(s): Pedro Earls  Supervising Physician: Pedro Earls  Patient Status:  [facility] - In-pt  Chief Complaint:  Pontine stroke post diagnostic cerebral angiogram as an outpatient 7/28 in [facility]   Subjective:  Pt lying in bed having echo study. She only c/o mild, intermittent headache. She reports that her speech has almost returned back to normal. She denies dizziness or vision changes and states that she has been walking with PT. She denies other neurological symptoms and has no other complaints. She adds that admitting states she will most likely go home today.   Allergies: Aspirin, Beta adrenergic blockers, Lisinopril, Mtx support [cobalamine combinations], Vasotec [enalapril], Verapamil, Voltaren [diclofenac sodium], Erythromycin, Indocin [indomethacin], and Jardiance [empagliflozin]  Medications: Prior to Admission medications   Medication Sig Start Date End Date Taking? Authorizing Provider  acetaminophen (TYLENOL) 500 MG tablet Take 2 tablets (1,000 mg total) by mouth every 6 (six) hours as needed for mild pain. 12/16/21  Yes Piscoya, Jacqulyn Bath, MD  albuterol (PROVENTIL) (2.5 MG/3ML) 0.083% nebulizer solution Take 3 mLs (2.5 mg total) by nebulization every 4 (four) hours as needed for wheezing or shortness of breath. 04/02/21  Yes McLean-Scocuzza, Nino Glow, MD  albuterol (VENTOLIN HFA) 108 (90 Base) MCG/ACT inhaler Inhale 2 puffs into the lungs every 6 (six) hours as needed for wheezing or shortness of breath.   Yes [provider]  amLODipine (NORVASC) 5 MG tablet TAKE 1 TABLET(5 MG) BY MOUTH TWICE DAILY 08/26/21  Yes Einar Pheasant, MD  budesonide-formoterol [facility]) 160-4.5 MCG/ACT inhaler Inhale 2 puffs into the lungs 2 (two) times daily. Patient taking differently: Inhale 2 puffs into the lungs 2 (two) times daily as needed (shortness of breath). 11/05/21  Yes Einar Pheasant, MD  Cholecalciferol (VITAMIN D) 50 MCG (2000 UT)  tablet Take 2,000 Units by mouth daily.   Yes [provider]  diphenhydramine-acetaminophen (TYLENOL PM) 25-500 MG TABS tablet Take 2 tablets by mouth at bedtime as needed (sleep).   Yes [provider]  fluticasone (FLOVENT HFA) 110 MCG/ACT inhaler Inhale 2 puffs into the lungs 2 (two) times daily as needed (shortness of breath).   Yes [provider]  gabapentin (NEURONTIN) 100 MG capsule Take 400 mg by mouth at bedtime. 01/12/22  Yes [provider]  insulin degludec (TRESIBA FLEXTOUCH) 100 UNIT/ML FlexTouch Pen Inject 14 Units into the skin daily. Patient taking differently: Inject 12 Units into the skin daily with supper. 11/05/21  Yes Einar Pheasant, MD  loperamide (IMODIUM) 2 MG capsule Take 4 mg by mouth daily.   Yes [provider]  losartan (COZAAR) 100 MG tablet Take 1 tablet (100 mg total) by mouth at bedtime. 01/29/22  Yes Einar Pheasant, MD  Melatonin 10 MG TABS Take 10 mg by mouth at bedtime.   Yes [provider]  omeprazole (PRILOSEC) 20 MG capsule TAKE 1 CAPSULE(20 MG) BY MOUTH TWICE DAILY 06/27/21  Yes Einar Pheasant, MD  rosuvastatin (CRESTOR) 10 MG tablet TAKE 1 TABLET(10 MG) BY MOUTH DAILY 03/26/22  Yes Einar Pheasant, MD  ONE TOUCH ULTRA TEST test strip PATIENT NEEDS NEW METER STRIPS AND LANCETS FOR ONE TOUCH. HER INSURANCE NO LONGER COVERS ACCUCHEK. 07/31/15   Einar Pheasant, MD     Vital Signs: BP 140/77 (BP Location: Right Arm)   Pulse 65   Temp 98.2 F (36.8 C) (Oral)   Resp 16   Ht '5\' 4"'  (1.626 m)   Wt 112 lb 10.5 oz (51.1 kg)  SpO2 100%   BMI 19.34 kg/m   Physical Exam Vitals reviewed.  Constitutional:      Appearance: Normal appearance.  HENT:     Head: Normocephalic and atraumatic.  Eyes:     Extraocular Movements: Extraocular movements intact.     Pupils: Pupils are equal, round, and reactive to light.  Cardiovascular:     Rate and Rhythm: Normal rate.  Pulmonary:     Effort: Pulmonary effort  is normal. No respiratory distress.  Musculoskeletal:     Right lower leg: No edema.     Left lower leg: No edema.  Skin:    General: Skin is warm and dry.     Comments: Site with no active bleeding and no appreciable pseudoaneurysm. Dressing is C/D/I   Neurological:     Mental Status: She is alert and oriented to person, place, and time.     Comments: Alert, aware and oriented X 3 Speech and comprehension is intact.  PERRL bilaterally Slight left sided droop to mouth noticeable with speaking Tongue midline Can spontaneously move all 4 extremities.  Hand grip strength right 5/5, left grip strength 4/5 Lower extremity strength right 5/5, left 4/5  Negative pronator drift. Fine motor and coordination grossly intact Gait not assessed    Psychiatric:        Mood and Affect: Mood normal.        Behavior: Behavior normal.        Thought Content: Thought content normal.        Judgment: Judgment normal.     Imaging: CT ANGIO HEAD NECK W WO CM  Result Date: 05/31/2022 CLINICAL DATA:  71 year old female status post cerebral angiogram, treated right anterior communicating artery aneurysm. Multiple small posterior circulation infarcts on MRI yesterday. Unrevealing MRA head and neck. EXAM: CT ANGIOGRAPHY HEAD AND NECK TECHNIQUE: Multidetector CT imaging of the head and neck was performed using the standard protocol during bolus administration of intravenous contrast. Multiplanar CT image reconstructions and MIPs were obtained to evaluate the vascular anatomy. Carotid stenosis measurements (when applicable) are obtained utilizing NASCET criteria, using the distal internal carotid diameter as the denominator. RADIATION DOSE REDUCTION: This exam was performed according to the departmental dose-optimization program which includes automated exposure control, adjustment of the mA and/or kV according to patient size and/or use of iterative reconstruction technique. CONTRAST:  63m OMNIPAQUE IOHEXOL  350 MG/ML SOLN COMPARISON:  Brain MRI, MRA head and neck yesterday. FINDINGS: CT HEAD Brain: Right paracentral pontine infarct is evident on series 7, image 7 today. But the remaining small posterior fossa and parieto-occipital infarcts on MRI remain occult by CT. No acute intracranial hemorrhage identified. No midline shift, mass effect, or evidence of intracranial mass lesion. No ventriculomegaly. Chronic anterior communicating artery embolization coil pack. No new cerebral infarct identified. Calvarium and skull base: No acute osseous abnormality identified. Paranasal sinuses: Visualized paranasal sinuses and mastoids are stable and well aerated. Orbits: No acute orbit or scalp soft tissue finding. CTA NECK Skeleton: No acute osseous abnormality identified. Upper chest: Negative. Other neck: Negative. Aortic arch: 3 vessel arch configuration. Tortuous proximal great vessels but mild arch atherosclerosis. Right carotid system: Mildly tortuous brachiocephalic artery. Tortuous right CCA origin. Negative right carotid bifurcation. No stenosis to the skull base. Left carotid system: Tortuosity with no atherosclerosis or stenosis. Vertebral arteries: Tortuous proximal right subclavian artery with no plaque or stenosis. Normal right vertebral artery origin. Right vertebral artery appears patent and normal to the skull base. Minimal plaque in the  proximal left subclavian artery. Normal left vertebral artery origin. Codominant left vertebral artery is patent and within normal limits to the skull base. CTA HEAD Posterior circulation: Codominant vertebral V4 segments are patent without stenosis to the vertebrobasilar junction. Both a ICAs appear dominant. A small left PICA is patent. Patent basilar artery without stenosis. Patent SCA and PCA origins. Posterior communicating arteries are diminutive or absent. Bilateral PCA branches are within normal limits. Anterior circulation: Both ICA siphons are patent. Mild-to-moderate  calcified plaque on the left with only mild distal cavernous stenosis on that side. Mild right siphon calcified plaque without stenosis. Normal ophthalmic artery origins. Patent carotid termini. Patent MCA and ACA origins. Anterior communicating artery region embolization coil pack. Bilateral ACA branches remain patent, the right A2 appears dominant. No adverse features identified. Left MCA M1 segment and bifurcation are patent without stenosis. Right MCA M1 segment and bifurcation are patent without stenosis. Bilateral MCA branches are within normal limits. Venous sinuses: Patent. Anatomic variants: None significant. Review of the MIP images confirms the above findings IMPRESSION: 1. Negative for large vessel occlusion. No atherosclerosis in the neck. Mild for age atherosclerosis at the skull base with no significant arterial stenosis. 2. 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 No hemorrhage or mass effect. The remaining small ischemic foci by MRI are occult by CT. 3. No new intracranial abnormality. Electronically Signed   By: Genevie Ann M.D.   On: 05/31/2022 10:44   MR ANGIO HEAD WO CONTRAST  Result Date: 05/30/2022 CLINICAL DATA:  Left-sided weakness and facial droop following cerebral arteriogram. Pontine infarct. EXAM: MRA HEAD WITHOUT CONTRAST TECHNIQUE: Angiographic images of the Circle of Willis were acquired using MRA technique without intravenous contrast. COMPARISON:  Cerebral arteriogram 05/29/2022 FINDINGS: Anterior circulation: Internal carotid arteries are within normal limits high cervical segments through the ICA termini bilaterally. Some attenuation proximal A2 segments likely artifact from the coil pack. The A1 and M1 segments are normal bilaterally. MCA bifurcations are within limits. The ACA and MCA branch vessels are within normal. Posterior circulation: The vertebral arteries are codominant. Vertebrobasilar junction and basilar artery are normal. Prominent AICA  vessels are noted bilaterally. Both posterior cerebral arteries originate from basilar tip. The PCA branch vessels are within normal limits. Anatomic variants: None Other: None. IMPRESSION: Normal MRA Circle of Willis. Electronically Signed   By: San Morelle M.D.   On: 05/30/2022 17:35   MR Angiogram Neck W or Wo Contrast  Result Date: 05/30/2022 CLINICAL DATA:  Left-sided weakness and facial droop. Acute brainstem infarct. Status post cerebral arteriogram yesterday. EXAM: MRA NECK WITHOUT AND WITH CONTRAST TECHNIQUE: Multiplanar and multiecho pulse sequences of the neck were obtained without and with intravenous contrast. Angiographic images of the neck were obtained using MRA technique without and with intravenous contrast. CONTRAST:  4.75m GADAVIST GADOBUTROL 1 MMOL/ML IV SOLN COMPARISON:  Cerebral arteriogram 05/29/2022 FINDINGS: The time-of-flight images are severely degraded by patient motion. The postcontrast images demonstrate a common origin of the left common carotid artery and innominate artery. Right common carotid artery is within normal limits. Bifurcation unremarkable. Cervical right ICA is normal. The left common carotid artery is. Bifurcation is within normal limits. Cervical left ICA is normal. The vertebral arteries are codominant. Both vertebral arteries originate from the subclavian arteries without significant stenosis. No significant stenosis is present in either vertebral artery in the neck. Intracranial vertebral arteries, vertebrobasilar junction and basilar artery are normal. IMPRESSION: 1. Normal MRA of the neck. 2. No acute or  focal abnormality to explain brainstem infarct. Electronically Signed   By: San Morelle M.D.   On: 05/30/2022 17:34   MR BRAIN WO CONTRAST  Result Date: 05/30/2022 CLINICAL DATA:  A deficit, acute, stroke suspected. Left-sided weakness facial droop. Cerebral are angiography yesterday. EXAM: MRI HEAD WITHOUT CONTRAST TECHNIQUE: Multiplanar,  multiecho pulse sequences of the brain and surrounding structures were obtained without intravenous contrast. COMPARISON:  None Available. FINDINGS: Brain: The diffusion-weighted images demonstrate acute nonhemorrhagic right paramedian pontine infarct measuring up to 2 cm in maximal diameter. Additional punctate foci of restricted diffusion are present in the right cerebellum and right greater than left occipital lobes. No anterior circulation infarcts are present. T2 signal changes are associated with the areas of acute infarct. No other significant white matter disease is present. The ventricles are of normal size. No significant extraaxial fluid collection is present. The internal auditory canals are within normal limits. Vascular: Flow is present in the major intracranial arteries. Skull and upper cervical spine: The craniocervical junction is normal. Upper cervical spine is within normal limits. Marrow signal is unremarkable. Sinuses/Orbits: The paranasal sinuses and mastoid air cells are clear. Bilateral lens replacements are noted. Globes and orbits are otherwise unremarkable. IMPRESSION: 1. 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 2. Additional punctate foci of acute nonhemorrhagic infarct involving the right cerebellum and right greater than left occipital lobes. These results were called by telephone at the time of interpretation on 05/30/2022 at 5:23 pm to provider Dr. Truett Mainland, who verbally acknowledged these results. Electronically Signed   By: San Morelle M.D.   On: 05/30/2022 17:24   IR ANGIO VERTEBRAL SEL VERTEBRAL UNI R MOD SED  Result Date: 05/29/2022 INDICATION: 71 year old female with past medical history significant for colitis, asthma, DM, hypertension, anemia and psoriatic arthritis. 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 She returns today for a diagnostic cerebral angiogram to  evaluate treatment outcome. EXAM: ULTRASOUND-GUIDED VASCULAR ACCESS DIAGNOSTIC CEREBRAL ANGIOGRAM COMPARISON:  Cerebral angiogram October 01, 2021. MEDICATIONS: 5,000 IU heparin and 300 mcg nitroglycerin intra-arterially to right radial artery. ANESTHESIA/SEDATION: Moderate (conscious) sedation was employed during this procedure. A total of Versed 1 mg and Fentanyl 50 mcg was administered intravenously by the radiology nurse. Total intra-service moderate Sedation Time: 41 minutes. The patient's level of consciousness and vital signs were monitored continuously by radiology nursing throughout the procedure under my direct supervision. CONTRAST:  50 mL of Omnipaque 300 milligram/mL FLUOROSCOPY: Radiation Exposure Index (as provided by the fluoroscopic device): 836 mGy Kerma COMPLICATIONS: None immediate. TECHNIQUE: Informed written consent was obtained from the patient after a thorough discussion of the procedural risks, benefits and alternatives. All questions were addressed. Maximal Sterile Barrier Technique was utilized including caps, mask, sterile gowns, sterile gloves, sterile drape, hand hygiene and skin antiseptic. A timeout was performed prior to the initiation of the procedure. Using the modified Seldinger technique and a micropuncture kit, access was gained to the distal right radial artery at the anatomical snuffbox and a 5 French sheath was placed. Real-time ultrasound guidance was utilized for vascular access including the acquisition of a permanent ultrasound image documenting patency of the accessed vessel. Slow intra arterial infusion of 5,000 IU heparin and 300 mcg nitroglycerin diluted in patient's own blood was performed. No significant fluctuation in patient's blood pressure seen. Then, a right radial artery angiogram was obtained via sheath side port. Next, a 5 [place] Simmons 2 glide catheter was navigated over a  0.035" Terumo Glidewire into the right subclavian artery under fluoroscopic  guidance. Frontal roadmap of the neck were obtained. Under fluoroscopy, the catheter was placed in the right vertebral artery. Angiograms with frontal, lateral, magnified right anterior oblique and magnified lateral views of the head were obtained. The catheter was subsequently placed into the right common carotid artery. Frontal and lateral angiograms of the neck were obtained. Using biplane roadmap guidance, the catheter was placed into the right internal carotid artery. Angiograms with frontal, lateral, magnified right anterior oblique, magnified left anterior oblique and magnified lateral views of the head were obtained. Due to radial artery vasospasm resulting in significant pain and difficulty maneuvering the catheter common no images were obtained of the left anterior circulation. The catheter was subsequently withdrawn. An inflatable band was placed and inflated over the right hand access site. The vascular sheath was withdrawn and the band was slowly deflated until brisk flow was noted through the arteriotomy site. At this point, the band was reinflated with additional 2 cc of air to obtain patent hemostasis. FINDINGS: Right radial artery ultrasound and right radial artery angiogram: The caliber of the distal right radial artery is appropriate for angiogram access. The right radial artery and the right ulnar artery have normal course and caliber, noting mild to moderate vasospasm in the mid radial artery. No significant anatomical variants noted. Right CCA angiograms: Cervical angiograms show normal course and caliber of the visualized right common carotid and internal carotid arteries. There are no significant stenoses. Right ICA angiograms: Status post treatment of a right A1-A2/ACA junction aneurysm with a web device. There is complete aneurysm occlusion without evidence of residual or recurrent aneurysm. No de Novo aneurysm identified. There is brisk vascular contrast filling of the right ACA and MCA  vascular trees. Luminal caliber is smooth and tapering. No abnormally high-flow, early draining veins are seen. No regions of abnormal hypervascularity are noted. The visualized dural sinuses are patent. Right vertebral artery angiograms: The right vertebral artery, basilar artery, and bilateral posterior cerebral arteries are unremarkable. Bilateral AICA-PICA complexes are noted and appear unremarkable. Luminal caliber is smooth and tapering. No aneurysms or abnormally high-flow, early draining veins are seen. No regions of abnormal hypervascularity are noted. The visualized dural sinuses are patent. Right subclavian artery roadmap: The right subclavian artery, proximal right vertebral artery and visualized branches of the thyrocervical trunk are unremarkable. PROCEDURE: No intervention performed. 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 No evidence of residual or recurrent aneurysm. PLAN: A follow-up MR angiogram within 1 year will be obtained. Electronically Signed   By: Pedro Earls M.D.   On: 05/29/2022 11:47   IR ANGIO INTRA EXTRACRAN SEL INTERNAL CAROTID UNI R MOD SED  Result Date: 05/29/2022 INDICATION: 71 year old female with past medical history significant for colitis, asthma, DM, hypertension, anemia and psoriatic arthritis. 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 She returns today for a diagnostic cerebral angiogram to evaluate treatment outcome. EXAM: ULTRASOUND-GUIDED VASCULAR ACCESS DIAGNOSTIC CEREBRAL ANGIOGRAM COMPARISON:  Cerebral angiogram October 01, 2021. MEDICATIONS: 5,000 IU heparin and 300 mcg nitroglycerin intra-arterially to right radial artery. ANESTHESIA/SEDATION: Moderate (conscious) sedation was employed during this procedure. A total of Versed 1 mg and Fentanyl 50 mcg was administered intravenously  by the radiology nurse. Total intra-service moderate Sedation Time: 41 minutes. The patient's level of consciousness and vital signs were monitored continuously by radiology nursing  throughout the procedure under my direct supervision. CONTRAST:  50 mL of Omnipaque 300 milligram/mL FLUOROSCOPY: Radiation Exposure Index (as provided by the fluoroscopic device): 103 mGy Kerma COMPLICATIONS: None immediate. TECHNIQUE: Informed written consent was obtained from the patient after a thorough discussion of the procedural risks, benefits and alternatives. All questions were addressed. Maximal Sterile Barrier Technique was utilized including caps, mask, sterile gowns, sterile gloves, sterile drape, hand hygiene and skin antiseptic. A timeout was performed prior to the initiation of the procedure. Using the modified Seldinger technique and a micropuncture kit, access was gained to the distal right radial artery at the anatomical snuffbox and a 5 French sheath was placed. Real-time ultrasound guidance was utilized for vascular access including the acquisition of a permanent ultrasound image documenting patency of the accessed vessel. Slow intra arterial infusion of 5,000 IU heparin and 300 mcg nitroglycerin diluted in patient's own blood was performed. No significant fluctuation in patient's blood pressure seen. Then, a right radial artery angiogram was obtained via sheath side port. Next, a 5 [place] Simmons 2 glide catheter was navigated over a 0.035" Terumo Glidewire into the right subclavian artery under fluoroscopic guidance. Frontal roadmap of the neck were obtained. Under fluoroscopy, the catheter was placed in the right vertebral artery. Angiograms with frontal, lateral, magnified right anterior oblique and magnified lateral views of the head were obtained. The catheter was subsequently placed into the right common carotid artery. Frontal and lateral angiograms of the neck were obtained. Using biplane roadmap guidance,  the catheter was placed into the right internal carotid artery. Angiograms with frontal, lateral, magnified right anterior oblique, magnified left anterior oblique and magnified lateral views of the head were obtained. Due to radial artery vasospasm resulting in significant pain and difficulty maneuvering the catheter common no images were obtained of the left anterior circulation. The catheter was subsequently withdrawn. An inflatable band was placed and inflated over the right hand access site. The vascular sheath was withdrawn and the band was slowly deflated until brisk flow was noted through the arteriotomy site. At this point, the band was reinflated with additional 2 cc of air to obtain patent hemostasis. FINDINGS: Right radial artery ultrasound and right radial artery angiogram: The caliber of the distal right radial artery is appropriate for angiogram access. The right radial artery and the right ulnar artery have normal course and caliber, noting mild to moderate vasospasm in the mid radial artery. No significant anatomical variants noted. Right CCA angiograms: Cervical angiograms show normal course and caliber of the visualized right common carotid and internal carotid arteries. There are no significant stenoses. Right ICA angiograms: Status post treatment of a right A1-A2/ACA junction aneurysm with a web device. There is complete aneurysm occlusion without evidence of residual or recurrent aneurysm. No de Novo aneurysm identified. There is brisk vascular contrast filling of the right ACA and MCA vascular trees. Luminal caliber is smooth and tapering. No abnormally high-flow, early draining veins are seen. No regions of abnormal hypervascularity are noted. The visualized dural sinuses are patent. Right vertebral artery angiograms: The right vertebral artery, basilar artery, and bilateral posterior cerebral arteries are unremarkable. Bilateral AICA-PICA complexes are noted and appear unremarkable. Luminal  caliber is smooth and tapering. No aneurysms or abnormally high-flow, early draining veins are seen. No regions of abnormal hypervascularity are noted. The visualized dural sinuses are patent. Right subclavian artery roadmap: The right subclavian artery, proximal right vertebral artery and visualized branches of the thyrocervical trunk are unremarkable. PROCEDURE: No intervention performed.  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 No evidence of residual or recurrent aneurysm. PLAN: A follow-up MR angiogram within 1 year will be obtained. Electronically Signed   By: Pedro Earls M.D.   On: 05/29/2022 11:47   IR US Guide Vasc Access Right  Result Date: 05/29/2022 INDICATION: 71 year old female with past medical history significant for colitis, asthma, DM, hypertension, anemia and psoriatic arthritis. 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 She returns today for a diagnostic cerebral angiogram to evaluate treatment outcome. EXAM: ULTRASOUND-GUIDED VASCULAR ACCESS DIAGNOSTIC CEREBRAL ANGIOGRAM COMPARISON:  Cerebral angiogram October 01, 2021. MEDICATIONS: 5,000 IU heparin and 300 mcg nitroglycerin intra-arterially to right radial artery. ANESTHESIA/SEDATION: Moderate (conscious) sedation was employed during this procedure. A total of Versed 1 mg and Fentanyl 50 mcg was administered intravenously by the radiology nurse. Total intra-service moderate Sedation Time: 41 minutes. The patient's level of consciousness and vital signs were monitored continuously by radiology nursing throughout the procedure under my direct supervision. CONTRAST:  50 mL of Omnipaque 300 milligram/mL FLUOROSCOPY: Radiation Exposure Index (as provided by the fluoroscopic device): 626 mGy Kerma COMPLICATIONS: None immediate. TECHNIQUE: Informed written consent  was obtained from the patient after a thorough discussion of the procedural risks, benefits and alternatives. All questions were addressed. Maximal Sterile Barrier Technique was utilized including caps, mask, sterile gowns, sterile gloves, sterile drape, hand hygiene and skin antiseptic. A timeout was performed prior to the initiation of the procedure. Using the modified Seldinger technique and a micropuncture kit, access was gained to the distal right radial artery at the anatomical snuffbox and a 5 French sheath was placed. Real-time ultrasound guidance was utilized for vascular access including the acquisition of a permanent ultrasound image documenting patency of the accessed vessel. Slow intra arterial infusion of 5,000 IU heparin and 300 mcg nitroglycerin diluted in patient's own blood was performed. No significant fluctuation in patient's blood pressure seen. Then, a right radial artery angiogram was obtained via sheath side port. Next, a 5 [place] Simmons 2 glide catheter was navigated over a 0.035" Terumo Glidewire into the right subclavian artery under fluoroscopic guidance. Frontal roadmap of the neck were obtained. Under fluoroscopy, the catheter was placed in the right vertebral artery. Angiograms with frontal, lateral, magnified right anterior oblique and magnified lateral views of the head were obtained. The catheter was subsequently placed into the right common carotid artery. Frontal and lateral angiograms of the neck were obtained. Using biplane roadmap guidance, the catheter was placed into the right internal carotid artery. Angiograms with frontal, lateral, magnified right anterior oblique, magnified left anterior oblique and magnified lateral views of the head were obtained. Due to radial artery vasospasm resulting in significant pain and difficulty maneuvering the catheter common no images were obtained of the left anterior circulation. The catheter was subsequently withdrawn. An inflatable band  was placed and inflated over the right hand access site. The vascular sheath was withdrawn and the band was slowly deflated until brisk flow was noted through the arteriotomy site. At this point, the band was reinflated with additional 2 cc of air to obtain patent hemostasis. FINDINGS: Right radial artery ultrasound and right radial artery angiogram: The caliber of the distal right radial artery is appropriate for angiogram access. The right radial artery and the right ulnar artery have normal course and caliber, noting mild to moderate vasospasm in the mid radial artery. No  significant anatomical variants noted. Right CCA angiograms: Cervical angiograms show normal course and caliber of the visualized right common carotid and internal carotid arteries. There are no significant stenoses. Right ICA angiograms: Status post treatment of a right A1-A2/ACA junction aneurysm with a web device. There is complete aneurysm occlusion without evidence of residual or recurrent aneurysm. No de Novo aneurysm identified. There is brisk vascular contrast filling of the right ACA and MCA vascular trees. Luminal caliber is smooth and tapering. No abnormally high-flow, early draining veins are seen. No regions of abnormal hypervascularity are noted. The visualized dural sinuses are patent. Right vertebral artery angiograms: The right vertebral artery, basilar artery, and bilateral posterior cerebral arteries are unremarkable. Bilateral AICA-PICA complexes are noted and appear unremarkable. Luminal caliber is smooth and tapering. No aneurysms or abnormally high-flow, early draining veins are seen. No regions of abnormal hypervascularity are noted. The visualized dural sinuses are patent. Right subclavian artery roadmap: The right subclavian artery, proximal right vertebral artery and visualized branches of the thyrocervical trunk are unremarkable. PROCEDURE: No intervention performed. 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 No evidence of residual or recurrent aneurysm. PLAN: A follow-up MR angiogram within 1 year will be obtained. Electronically Signed   By: Pedro Earls M.D.   On: 05/29/2022 11:47    Labs:  CBC: Recent Labs    04/06/22 1503 05/29/22 0655 05/30/22 1241 05/30/22 1249 06/01/22 0223  WBC 5.1 5.1 6.9  --  5.4  HGB 8.7 Repeated and verified X2.* 10.8* 10.6* 11.2* 9.6*  HCT 27.4* 34.4* 33.1* 33.0* 30.5*  PLT 270.0 263 258  --  208    COAGS: Recent Labs    10/01/21 0635 05/29/22 0655 05/30/22 1241  INR 1.0 1.0 1.0  APTT 23*  --  25    BMP: Recent Labs    10/01/21 0635 10/17/21 0838 02/20/22 0904 05/29/22 0655 05/30/22 1241 05/30/22 1249 06/01/22 0223  NA 139   < > 143 141 141 142 141  K 3.7   < > 3.8 4.0 3.8 3.8 3.8  CL 105   < > 108 108 111 107 107  CO2 26   < > '29 27 24  ' --  25  GLUCOSE 114*   < > 54* 63* 190* 180* 86  BUN 13   < > '11 16 17 18 15  ' CALCIUM 8.6*   < > 7.9* 8.5* 8.2*  --  8.4*  CREATININE 0.87   < > 0.81 0.83 0.88 0.80 0.87  GFRNONAA >60  --   --  >60 >60  --  >60   < > = values in this interval not displayed.    LIVER FUNCTION TESTS: Recent Labs    10/17/21 0838 02/20/22 0904 05/30/22 1241  BILITOT 0.4 0.3 0.2*  AST '17 18 28  ' ALT '11 11 23  ' ALKPHOS 74 97 78  PROT 5.5* 5.3* 5.2*  ALBUMIN 3.6 3.1* 3.0*    Assessment and Plan:  Pt underwent diagnostic cerebral angiogram 7/28 in NIR with no residual or recurrent aneurysm found. Pt states post procedure, she had slurred speech and left sided weakness that was attributed to sedation medication. She continued to be symptomatic through 7/30 and subsequently transported to [facility] ED where she was diagnosed with pontine stroke.   Alert, aware and oriented X 3 Speech and comprehension is intact.  PERRL bilaterally Slight left sided droop to mouth noticeable with speaking Tongue midline  Can spontaneously  move all 4 extremities.  Hand grip strength right 5/5, left grip strength 4/5 Lower extremity strength right 5/5, left 4/5  Negative pronator drift. Fine motor and coordination grossly intact. Gait not assessed.   Plan: Pt states admitting may d/c her home today.   Pt to have MR angiogram for 1 year follow-up.  Please call IR with questions or concerns.   Electronically Signed: Tyson Alias, NP 06/01/2022, 9:20 AM   I spent a total of 15 Minutes at the the patient's bedside AND on the patient's hospital floor or unit, greater than 50% of which was counseling/coordinating care for pontine stroke.

## 2022-06-01 NOTE — Progress Notes (Signed)
  Transition of Care [facility]) Screening Note   Patient Details  Name: Ashley Gross Date of Birth: Mar 14, 1951   Transition of Care [facility]) CM/SW Contact:    Cyndi Bender, RN Phone Number: 06/01/2022, 8:41 AM    Transition of Care Department [facility]) has reviewed patient and no TOC needs have been identified at this time. We will continue to monitor patient advancement through interdisciplinary progression rounds. If new patient transition needs arise, please place a TOC consult.

## 2022-06-01 NOTE — Progress Notes (Signed)
Physical Therapy Treatment Patient Details Name: Ashley Gross MRN: [id number] DOB: 05/07/1951 Today's Date: 06/01/2022   History of Present Illness Ashley Gross is a 71 y.o. 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 Also additional punctate foci of acute infarct in the right cerebellum and right greater than left occipital lobes; with medical history significant of anterior communicating artery aneursym s/p embolization (09/2021), meningoma, iron deficiency anemia, exocrine pancreatic insufficiency, posterior pituitary lesion, chronic headaches, HTN, mild intermittent Asthma, insulin dependent T2DM, psoriatic arthritis    PT Comments    Pt reports feeling better today, but still notes LLE weakness and incoordination. Pt presenting with worsening LLE coordination and motor grading without UE support, PT encouraged continued use of RW until HHPT or OPPT clears her. Pt tolerated min balance challenges with increased time and mild unsteadiness, reliant on use of RW during assessment. PT feels pt is more appropriate for OPPT at this time to work on higher level balance and coordination, recommendations updated to reflect this.     Recommendations for follow up therapy are one component of a multi-disciplinary discharge planning process, led by the attending physician.  Recommendations may be updated based on patient status, additional functional criteria and insurance authorization.  Follow Up Recommendations  Outpatient PT (OP neuro for higher level balance and LLE coordination)     Assistance Recommended at Discharge Set up  Supervision/Assistance  Patient can return home with the following A little help with walking and/or transfers;Assist for transportation   Equipment Recommendations  Rolling walker (2 wheels);BSC/3in1 (Might be able to use her husband's old equipment)    Recommendations for Other Services       Precautions / Restrictions Precautions Precautions: Fall Precaution Comments: Fall risk is significantly lessened with use of RW Restrictions Weight Bearing Restrictions: No     Mobility  Bed Mobility Overal bed mobility: Needs Assistance Bed Mobility: Supine to Sit     Supine to sit: Supervision          Transfers Overall transfer level: Needs assistance Equipment used: Rolling walker (2 wheels) Transfers: Sit to/from Stand Sit to Stand: Min guard           General transfer comment: for safety, cues for hand placement when standing with RW. STS x2, from EOB and toilet.    Ambulation/Gait Ambulation/Gait assistance: Min guard, Min assist Gait Distance (Feet): 400 Feet Assistive device: Rolling walker (2 wheels), None Gait Pattern/deviations: Step-through pattern, Decreased stance time - left, Decreased step length - left Gait velocity: decr     General Gait Details: min assist for gait without AD for steadying, close guard when walking with RW for safety. Cues for hallway navigation   Stairs Stairs: Yes Stairs assistance: Min guard Stair Management: Two rails, Step to pattern, Forwards Number of Stairs: 6 General stair comments: close guard for safety, cues for sequencing (up with the good leg, down with the bad leg) and having assist for navigating steps once d/c   Wheelchair Mobility    Modified Rankin (Stroke Patients Only) Modified Rankin (Stroke Patients Only) Pre-Morbid Rankin Score: No symptoms Modified Rankin: Moderate disability     Balance Overall balance assessment: Needs assistance Sitting-balance support:  Bilateral upper extremity supported,  Feet supported Sitting balance-Leahy Scale: Good     Standing balance support: During functional activity, Reliant on assistive device for balance Standing balance-Leahy Scale: Poor                 High Level Balance Comments: min impairment (unsteadiness pt-recovered, increased time): step over object, horizontal head turns, stairs            Cognition Arousal/Alertness: Awake/alert Behavior During Therapy: WFL for tasks assessed/performed Overall Cognitive Status: Within Functional Limits for tasks assessed                                          Exercises      General Comments General comments (skin integrity, edema, etc.): VSS on RA      Pertinent Vitals/Pain Pain Assessment Pain Assessment: No/denies pain    Home Living                          Prior Function            PT Goals (current goals can now be found in the care plan section) Acute Rehab PT Goals Patient Stated Goal: back to independence PT Goal Formulation: With patient Time For Goal Achievement: 06/14/22 Potential to Achieve Goals: Good Progress towards PT goals: Progressing toward goals    Frequency    Min 4X/week      PT Plan Current plan remains appropriate    Co-evaluation              AM-PAC PT "6 Clicks" Mobility   Outcome Measure  Help needed turning from your back to your side while in a flat bed without using bedrails?: None Help needed moving from lying on your back to sitting on the side of a flat bed without using bedrails?: A Little Help needed moving to and from a bed to a chair (including a wheelchair)?: A Little Help needed standing up from a chair using your arms (e.g., wheelchair or bedside chair)?: A Little Help needed to walk in hospital room?: A Little Help needed climbing 3-5 steps with a railing? : A Little 6 Click Score: 19    End of Session Equipment Utilized During Treatment: Gait belt Activity Tolerance: Patient  tolerated treatment well Patient left: in bed;with call bell/phone within reach;with bed alarm set (sitting EOB) Nurse Communication: Mobility status PT Visit Diagnosis: Hemiplegia and hemiparesis Hemiplegia - Right/Left: Left Hemiplegia - dominant/non-dominant: Non-dominant Hemiplegia - caused by: Cerebral infarction     Time: 8592-9244 PT Time Calculation (min) (ACUTE ONLY): 30 min  Charges:  $Gait Training: 23-37 mins                     Stacie Glaze, PT DPT Acute Rehabilitation Services Pager [phone]  Office [phone]    Roxine Caddy E Ruffin Pyo 06/01/2022, 12:43 PM

## 2022-06-01 NOTE — Progress Notes (Signed)
STROKE TEAM PROGRESS NOTE   INTERVAL HISTORY Patient sitting up in bed.  She states she is doing well.  Still ready to go home.  She states she cannot take aspirin since she has bleeding polyps as well as AVM in her GI tract.  Vital signs stable.  Neurological exam unchanged  Vitals:   06/01/22 0023 06/01/22 0431 06/01/22 0817 06/01/22 1200  BP: 132/83 119/74 140/77 120/60  Pulse: 64 64 65 (!) 56  Resp: '14 16 16 17  '$ Temp: 97.6 F (36.4 C) 98.2 F (36.8 C) 98.2 F (36.8 C) 98.2 F (36.8 C)  TempSrc: Oral Oral Oral Oral  SpO2: 100% 97% 100% 98%  Weight:      Height:       CBC:  Recent Labs  Lab 05/29/22 0655 05/30/22 1241 05/30/22 1249 06/01/22 0223  WBC 5.1 6.9  --  5.4  NEUTROABS 3.1 5.0  --   --   HGB 10.8* 10.6* 11.2* 9.6*  HCT 34.4* 33.1* 33.0* 30.5*  MCV 90.8 90.4  --  89.4  PLT 263 258  --  952   Basic Metabolic Panel:  Recent Labs  Lab 05/30/22 1241 05/30/22 1249 06/01/22 0223  NA 141 142 141  K 3.8 3.8 3.8  CL 111 107 107  CO2 24  --  25  GLUCOSE 190* 180* 86  BUN '17 18 15  '$ CREATININE 0.88 0.80 0.87  CALCIUM 8.2*  --  8.4*   Lipid Panel:  Recent Labs  Lab 05/31/22 0455  CHOL 120  TRIG 80  HDL 49  CHOLHDL 2.4  VLDL 16  LDLCALC 55   HgbA1c:  Recent Labs  Lab 05/31/22 0455  HGBA1C 5.0   Urine Drug Screen: No results for input(s): "LABOPIA", "COCAINSCRNUR", "LABBENZ", "AMPHETMU", "THCU", "LABBARB" in the last 168 hours.  Alcohol Level  Recent Labs  Lab 05/30/22 1241  ETH <10   IMAGING past 24 hours ECHOCARDIOGRAM COMPLETE  Result Date: 06/01/2022    ECHOCARDIOGRAM REPORT   Patient Name:   Ashley Gross Date of Exam: 06/01/2022 Medical Rec #:  [id number]        Height:       64.0 in Accession #:    [id number]       Weight:       112.7 lb Date of Birth:  1951/06/21        BSA:          1.533 m Patient Age:    71 years         BP:           140/77 mmHg Patient Gender: F                HR:           65 bpm. Exam Location:  Inpatient  Procedure: 2D Echo, Cardiac Doppler and Color Doppler Indications:    Stroke I63.9  History:        Patient has no prior history of Echocardiogram examinations.                 Signs/Symptoms:Murmur; Risk Factors:Hypertension and Diabetes.  Sonographer:    Darlina Sicilian RDCS Referring Phys: [id number] [place]  1. Left ventricular ejection fraction, by estimation, is 65 to 70%. The left ventricle has normal function. The left ventricle has no regional wall motion abnormalities. Left ventricular diastolic parameters are consistent with Grade I diastolic dysfunction (impaired relaxation).  2. Right ventricular systolic function is normal. The  right ventricular size is normal. Tricuspid regurgitation signal is inadequate for assessing PA pressure.  3. The mitral valve is normal in structure. Mild mitral valve regurgitation.  4. The aortic valve was not well visualized. Aortic valve regurgitation is trivial. No aortic stenosis is present.  5. The inferior vena cava is normal in size with greater than 50% respiratory variability, suggesting right atrial pressure of 3 mmHg. Conclusion(s)/Recommendation(s): No intracardiac source of embolism detected on this transthoracic study. Consider a transesophageal echocardiogram to exclude cardiac source of embolism if clinically indicated. FINDINGS  Left Ventricle: Left ventricular ejection fraction, by estimation, is 65 to 70%. The left ventricle has normal function. The left ventricle has no regional wall motion abnormalities. The left ventricular internal cavity size was normal in size. There is  no left ventricular hypertrophy. Left ventricular diastolic parameters are consistent with Grade I diastolic dysfunction (impaired relaxation). Right Ventricle: The right ventricular size is normal. No increase in right ventricular wall thickness. Right ventricular systolic function is normal. Tricuspid regurgitation signal is inadequate for assessing PA pressure. Left  Atrium: Left atrial size was normal in size. Right Atrium: Right atrial size was normal in size. Pericardium: There is no evidence of pericardial effusion. Mitral Valve: The mitral valve is normal in structure. Mild mitral valve regurgitation. Tricuspid Valve: The tricuspid valve is normal in structure. Tricuspid valve regurgitation is not demonstrated. Aortic Valve: The aortic valve was not well visualized. Aortic valve regurgitation is trivial. No aortic stenosis is present. Pulmonic Valve: The pulmonic valve was not well visualized. Aorta: The aortic root is normal in size and structure. Venous: The inferior vena cava is normal in size with greater than 50% respiratory variability, suggesting right atrial pressure of 3 mmHg. IAS/Shunts: No atrial level shunt detected by color flow Doppler.  LEFT VENTRICLE PLAX 2D LVIDd:         4.80 cm   Diastology LVIDs:         2.10 cm   LV e' medial:    5.11 cm/s LV PW:         0.70 cm   LV E/e' medial:  19.6 LV IVS:        0.80 cm   LV e' lateral:   10.70 cm/s LVOT diam:     1.90 cm   LV E/e' lateral: 9.3 LV SV:         65 LV SV Index:   42 LVOT Area:     2.84 cm  RIGHT VENTRICLE RV S prime:     14.30 cm/s TAPSE (M-mode): 3.6 cm LEFT ATRIUM           Index        RIGHT ATRIUM           Index LA diam:      3.70 cm 2.41 cm/m   RA Area:     10.00 cm LA Vol (A2C): 37.2 ml 24.27 ml/m  RA Volume:   19.60 ml  12.79 ml/m LA Vol (A4C): 38.7 ml 25.25 ml/m  AORTIC VALVE LVOT Vmax:   95.80 cm/s LVOT Vmean:  62.600 cm/s LVOT VTI:    0.228 m MITRAL VALVE MV Area (PHT): 1.55 cm     SHUNTS MV Decel Time: 489 msec     Systemic VTI:  0.23 m MV E velocity: 100.00 cm/s  Systemic Diam: 1.90 cm MV A velocity: 131.00 cm/s MV E/A ratio:  0.76 Gwyndolyn Kaufman MD Electronically signed by Gwyndolyn Kaufman MD Signature Date/Time: 06/01/2022/12:57:14 PM  Final     PHYSICAL EXAM Neurological Examination Mental Status: Alert, oriented throughout, thought content appropriate. Minimal  dysarthria is noted; speech otherwise is fluent without evidence of aphasia. Able to follow all commands without difficulty. Naming and repetition are intact.  Cranial Nerves: II: Temporal visual fields intact with no extinction to DSS. PERRL  III,IV, VI: No ptosis. EOMI with saccadic quality of visual pursuits noted.  V: Facial sensation to light touch is intact and symmetric VII: Mild left facial droop present VIII: Hearing is intact to voice IX,X: No hoarseness or hypophonia XI: Symmetric shoulder shrug XII: Midline tongue protrusion Motor: RUE and RLE 5/5 LUE 4/5 proximally and distally LLE 4-/5 proximally and distally  Patient has wobbling/minimal vertical drift on examination of left upper and lower extremities Sensory: Temp and light touch intact throughout, bilaterally. No extinction to DSS.  Deep Tendon Reflexes: Deferred Cerebellar: Right side is normal. LUE with ataxia on FNF. LLE with minimal ataxia noted on HKS. Gait: Deferred  ASSESSMENT/PLAN Ashley Gross is a 71 y.o. female with history of DM2, colitis, proriatic arthritis, asthma, HTN, anemia, left inguinal hernia s/p repair 12/16/21, ACOM aneurysm embolization in 2022 and arteriovenous malformation. Developed aphasia and weakness after cerebral angiogram on Friday. MRI with evidence of acute right pontine infarction.   Stroke:  acute right paramedian pontine infarction with additional punctate foci of infarction in the right cerebellum and right > left occipital lobes. Etiology is procedural complication from recent cerebral angiogram procedure on 7/28.  MRI  Acute nonhemorrhagic 2 cm right paramedian pontine infarct, punctate foci of infarction in the right cerebellum, and right > left occipital lobes MRA  normal MRA circle of Willis, normal MRA neck  2D Echo pending LDL 55 HgbA1c 5.0 VTE prophylaxis - lovenox    Diet   Diet Heart Room service appropriate? Yes; Fluid consistency: Thin   No antithrombotic  prior to admission, now on No antithrombotic due to history of severe anemia r/t GIB secondary to AVMs with ASA use in the past.  Therapy recommendations:  pending Disposition:  pending  Hypertension Home meds:  Norvasc 5 mg, Cozaar 100 mg  Stable Permissive hypertension (OK if < 220/120) but gradually normalize in 5-7 days Long-term BP goal normotensive  Hyperlipidemia Home meds:  rosuvastatin 10 mg, resumed in hospital LDL 55, goal < 70 High intensity statin not indicated as cholesterol is controlled with moderate-intensity rosuvastatin dose Continue statin at discharge  Diabetes type II Controlled Home meds:  insulin degludec  HgbA1c 5.0, goal < 7.0 CBGs Recent Labs    05/30/22 1208 06/01/22 0820 06/01/22 1159  GLUCAP 174* 185* 144*    SSI  Other Stroke Risk Factors Advanced Age >/= 50   Other Active Problems 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  She is reluctant to start taking aspirin since she has history of lower GI bleeding from diverticulosis as well as as a GI AVM.  Recommend aggressive risk factor modification and no antiplatelet therapy for now.  Follow-up in the outpatient stroke clinic in 2 months.  Greater than 50% time during this 35-minute visit was spent on counseling and coordination of care about her periprocedural strokes and discussion of stroke prevention and answering questions.  Discussed  with Dr. Georgeanna Lea, MD To contact Stroke Continuity provider, please refer to [email or web address] After hours, contact General Neurology

## 2022-06-01 NOTE — Discharge Summary (Addendum)
Physician Discharge Summary  Ashley Gross UMP:[id number] DOB: 09-16-1951 DOA: 05/30/2022  PCP: Einar Pheasant, MD  Admit date: 05/30/2022 Discharge date: 06/01/2022  Admitted From: Home Disposition:  Home   Recommendations for Outpatient Follow-up:  Follow up with PCP in 1-2 weeks Please obtain BMP/CBC in one week   Home Health: Outpatient PT/OT  Discharge Condition:Stable CODE STATUS: FULL Diet recommendation: Heart Healthy / Carb Modified     Brief/Interim Summary:  Ashley Gross is a 71 y.o. 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    Pt underwent routine diagnostic cerebral angiogram of previous embolized anterior communicating artery on 7/28.  Shortly after the procedure she had brief double vision, ongoing home she had difficulty with her gait and was stumbling when she was previously walking independently.  Also was having slurred speech.  They discussed with RN and was told it was likely due to medication for the procedure.  However, today she continued to have slurred speech and began to notice left facial droop.  Family then concerned about stroke given brought her here to [facility].   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 Also additional punctate foci of acute infarct in the right cerebellum and right greater than left occipital lobes. MRA head and neck were negative for acute findings.    -Patient was admitted for further work-up.      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  Has 1 year MRI angiogram follow-up.    Hypercholesterolemia Continue statin.   GERD (gastroesophageal reflux disease) Continue home omeprazole   Hypertension Permissive hypertension during hospital stay, resume home meds Juliane over the next 4 to 5 days   Diabetes mellitus, type II, insulin-dependent -Well-controlled with A1c of 5, continue with home medications  Discharge Diagnoses:  Principal Problem:   Stroke [facility]) Active Problems:   Hypertension   GERD (gastroesophageal reflux disease)   Hypercholesterolemia   Controlled type 2 diabetes mellitus with complication, with long-term current use of insulin (HCC)   Aneurysm of anterior communicating artery    Discharge Instructions  Discharge Instructions     Ambulatory referral to Occupational Therapy   Complete by: As directed    Ambulatory referral to Physical Therapy   Complete by: As directed    Diet - low sodium heart healthy   Complete by: As directed    Discharge instructions   Complete by: As directed    Follow with Primary MD Einar Pheasant, MD in 7 days   Get CBC, CMP, checked  by Primary MD next visit.    Activity: As tolerated with Full fall precautions use walker/cane & assistance as needed  Disposition Home   Diet: Heart Healthy / CARB MODIFIED  On your next visit with your primary care physician please Get Medicines reviewed and adjusted.   Please  request your Prim.MD to go over all Hospital Tests and Procedure/Radiological results at the follow up, please get all Hospital records sent to your Prim MD by signing hospital release before you go home.   If you experience worsening of your admission symptoms, develop shortness of breath, life threatening emergency, suicidal or homicidal thoughts you must seek medical attention immediately by calling 911 or calling your MD immediately  if symptoms less severe.  You Must read complete instructions/literature along with all the possible adverse reactions/side effects for all the Medicines you take and that have been prescribed to you. Take any new Medicines after you have completely understood and accpet all the possible adverse reactions/side effects.   Do not drive, operating heavy machinery, perform activities at heights, swimming or participation in water activities or provide baby sitting services if your were admitted for syncope or siezures until you have seen by Primary MD or a Neurologist and advised to do so again.  Do not drive when taking Pain medications.    Do not take more than prescribed Pain, Sleep and Anxiety Medications  Special Instructions: If you have smoked or chewed Tobacco  in the last 2 yrs please stop smoking, stop any regular Alcohol  and or any Recreational drug use.  Wear Seat belts while driving.   Please note  You were cared for by a hospitalist during your hospital stay. If you have any questions about your discharge medications or the care you received while you were in the hospital after you are discharged, you can call the unit and asked to speak with the hospitalist on call if the hospitalist that took care of you is not available. Once you are discharged, your primary care physician will handle any further medical issues. Please note that NO REFILLS for any discharge medications will be authorized once you are discharged, as it is imperative that you return  to your primary care physician (or establish a relationship with a primary care physician if you do not have one) for your aftercare needs so that they can reassess your need for medications and monitor your lab values.   Increase activity slowly   Complete by: As directed       Allergies as of 06/01/2022       Reactions   Aspirin Other (See Comments)   Increased bleeding   Beta Adrenergic Blockers    3rd degree blockage    Lisinopril Cough   Mtx Support [cobalamine Combinations] Other (See Comments)   Respiratory symptoms   Vasotec [enalapril] Cough   Verapamil Other (See Comments)   Heart block   Voltaren [diclofenac Sodium]    Pt unsure of reaction    Erythromycin Other (See Comments)   Gi intolerance   Indocin [indomethacin]    Upset stomach   Jardiance [empagliflozin] Other (See Comments)   Recurrent yeast infections, even with washout and rechallenge        Medication List     TAKE these medications    acetaminophen 500 MG tablet Commonly known as: TYLENOL Take 2 tablets (1,000 mg total) by mouth every 6 (six) hours as needed for mild pain.   albuterol 108 (90 Base) MCG/ACT inhaler Commonly known as: VENTOLIN HFA Inhale 2 puffs into the lungs every 6 (six) hours as needed for wheezing or shortness of breath.  albuterol (2.5 MG/3ML) 0.083% nebulizer solution Commonly known as: PROVENTIL Take 3 mLs (2.5 mg total) by nebulization every 4 (four) hours as needed for wheezing or shortness of breath.   amLODipine 5 MG tablet Commonly known as: NORVASC Take 1 tablet (5 mg total) by mouth in the morning and at bedtime. Start taking on: June 04, 2022 What changed:  See the new instructions. These instructions start on June 04, 2022. If you are unsure what to do until then, ask your doctor or other care provider.   budesonide-formoterol 160-4.5 MCG/ACT inhaler Commonly known as: SYMBICORT Inhale 2 puffs into the lungs 2 (two) times daily. What changed:  when  to take this reasons to take this   diphenhydramine-acetaminophen 25-500 MG Tabs tablet Commonly known as: TYLENOL PM Take 2 tablets by mouth at bedtime as needed (sleep).   fluticasone 110 MCG/ACT inhaler Commonly known as: FLOVENT HFA Inhale 2 puffs into the lungs 2 (two) times daily as needed (shortness of breath).   gabapentin 100 MG capsule Commonly known as: NEURONTIN Take 400 mg by mouth at bedtime.   loperamide 2 MG capsule Commonly known as: IMODIUM Take 4 mg by mouth daily.   losartan 100 MG tablet Commonly known as: COZAAR Take 1 tablet (100 mg total) by mouth at bedtime. Start taking on: June 06, 2022 What changed: These instructions start on June 06, 2022. If you are unsure what to do until then, ask your doctor or other care provider.   Melatonin 10 MG Tabs Take 10 mg by mouth at bedtime.   omeprazole 20 MG capsule Commonly known as: PRILOSEC TAKE 1 CAPSULE(20 MG) BY MOUTH TWICE DAILY   ONE TOUCH ULTRA TEST test strip Generic drug: glucose blood PATIENT NEEDS NEW METER STRIPS AND LANCETS FOR ONE TOUCH. HER INSURANCE NO LONGER COVERS ACCUCHEK.   rosuvastatin 10 MG tablet Commonly known as: CRESTOR TAKE 1 TABLET(10 MG) BY MOUTH DAILY   Tresiba FlexTouch 100 UNIT/ML FlexTouch Pen Generic drug: insulin degludec Inject 14 Units into the skin daily. What changed:  how much to take when to take this   Vitamin D 50 MCG (2000 UT) tablet Take 2,000 Units by mouth daily.        Follow-up Information     [facility]. Call.   Specialty: Rehabilitation Why: Call to make apt Contact information: [id number] [place] [address] [phone]               Allergies  Allergen Reactions   Aspirin Other (See Comments)    Increased bleeding   Beta Adrenergic Blockers     3rd degree blockage    Lisinopril Cough   Mtx Support [Cobalamine Combinations] Other (See  Comments)    Respiratory symptoms   Vasotec [Enalapril] Cough   Verapamil Other (See Comments)    Heart block   Voltaren [Diclofenac Sodium]     Pt unsure of reaction    Erythromycin Other (See Comments)    Gi intolerance   Indocin [Indomethacin]     Upset stomach   Jardiance [Empagliflozin] Other (See Comments)    Recurrent yeast infections, even with washout and rechallenge    Consultations: Neurology   Procedures/Studies: ECHOCARDIOGRAM COMPLETE  Result Date: 06/01/2022    ECHOCARDIOGRAM REPORT   Patient Name:   [facility] Date of Exam: 06/01/2022 Medical Rec #:  [id number]        Height:       64.0 in  Accession #:    [id number]       Weight:       112.7 lb Date of Birth:  Jun 14, 1951        BSA:          1.533 m Patient Age:    37 years         BP:           140/77 mmHg Patient Gender: F                HR:           65 bpm. Exam Location:  Inpatient Procedure: 2D Echo, Cardiac Doppler and Color Doppler Indications:    Stroke I63.9  History:        Patient has no prior history of Echocardiogram examinations.                 Signs/Symptoms:Murmur; Risk Factors:Hypertension and Diabetes.  Sonographer:    Darlina Sicilian RDCS Referring Phys: [id number] [place]  1. Left ventricular ejection fraction, by estimation, is 65 to 70%. The left ventricle has normal function. The left ventricle has no regional wall motion abnormalities. Left ventricular diastolic parameters are consistent with Grade I diastolic dysfunction (impaired relaxation).  2. Right ventricular systolic function is normal. The right ventricular size is normal. Tricuspid regurgitation signal is inadequate for assessing PA pressure.  3. The mitral valve is normal in structure. Mild mitral valve regurgitation.  4. The aortic valve was not well visualized. Aortic valve regurgitation is trivial. No aortic stenosis is present.  5. The inferior vena cava is normal in size with greater than 50% respiratory variability,  suggesting right atrial pressure of 3 mmHg. Conclusion(s)/Recommendation(s): No intracardiac source of embolism detected on this transthoracic study. Consider a transesophageal echocardiogram to exclude cardiac source of embolism if clinically indicated. FINDINGS  Left Ventricle: Left ventricular ejection fraction, by estimation, is 65 to 70%. The left ventricle has normal function. The left ventricle has no regional wall motion abnormalities. The left ventricular internal cavity size was normal in size. There is  no left ventricular hypertrophy. Left ventricular diastolic parameters are consistent with Grade I diastolic dysfunction (impaired relaxation). Right Ventricle: The right ventricular size is normal. No increase in right ventricular wall thickness. Right ventricular systolic function is normal. Tricuspid regurgitation signal is inadequate for assessing PA pressure. Left Atrium: Left atrial size was normal in size. Right Atrium: Right atrial size was normal in size. Pericardium: There is no evidence of pericardial effusion. Mitral Valve: The mitral valve is normal in structure. Mild mitral valve regurgitation. Tricuspid Valve: The tricuspid valve is normal in structure. Tricuspid valve regurgitation is not demonstrated. Aortic Valve: The aortic valve was not well visualized. Aortic valve regurgitation is trivial. No aortic stenosis is present. Pulmonic Valve: The pulmonic valve was not well visualized. Aorta: The aortic root is normal in size and structure. Venous: The inferior vena cava is normal in size with greater than 50% respiratory variability, suggesting right atrial pressure of 3 mmHg. IAS/Shunts: No atrial level shunt detected by color flow Doppler.  LEFT VENTRICLE PLAX 2D LVIDd:         4.80 cm   Diastology LVIDs:         2.10 cm   LV e' medial:    5.11 cm/s LV PW:         0.70 cm   LV E/e' medial:  19.6 LV IVS:        0.80  cm   LV e' lateral:   10.70 cm/s LVOT diam:     1.90 cm   LV E/e' lateral:  9.3 LV SV:         65 LV SV Index:   42 LVOT Area:     2.84 cm  RIGHT VENTRICLE RV S prime:     14.30 cm/s TAPSE (M-mode): 3.6 cm LEFT ATRIUM           Index        RIGHT ATRIUM           Index LA diam:      3.70 cm 2.41 cm/m   RA Area:     10.00 cm LA Vol (A2C): 37.2 ml 24.27 ml/m  RA Volume:   19.60 ml  12.79 ml/m LA Vol (A4C): 38.7 ml 25.25 ml/m  AORTIC VALVE LVOT Vmax:   95.80 cm/s LVOT Vmean:  62.600 cm/s LVOT VTI:    0.228 m MITRAL VALVE MV Area (PHT): 1.55 cm     SHUNTS MV Decel Time: 489 msec     Systemic VTI:  0.23 m MV E velocity: 100.00 cm/s  Systemic Diam: 1.90 cm MV A velocity: 131.00 cm/s MV E/A ratio:  0.76 Gwyndolyn Kaufman MD Electronically signed by Gwyndolyn Kaufman MD Signature Date/Time: 06/01/2022/12:57:14 PM    Final    CT ANGIO HEAD NECK W WO CM  Result Date: 05/31/2022 CLINICAL DATA:  71 year old female status post cerebral angiogram, treated right anterior communicating artery aneurysm. Multiple small posterior circulation infarcts on MRI yesterday. Unrevealing MRA head and neck. EXAM: CT ANGIOGRAPHY HEAD AND NECK TECHNIQUE: Multidetector CT imaging of the head and neck was performed using the standard protocol during bolus administration of intravenous contrast. Multiplanar CT image reconstructions and MIPs were obtained to evaluate the vascular anatomy. Carotid stenosis measurements (when applicable) are obtained utilizing NASCET criteria, using the distal internal carotid diameter as the denominator. RADIATION DOSE REDUCTION: This exam was performed according to the departmental dose-optimization program which includes automated exposure control, adjustment of the mA and/or kV according to patient size and/or use of iterative reconstruction technique. CONTRAST:  58m OMNIPAQUE IOHEXOL 350 MG/ML SOLN COMPARISON:  Brain MRI, MRA head and neck yesterday. FINDINGS: CT HEAD Brain: Right paracentral pontine infarct is evident on series 7, image 7 today. But the remaining small  posterior fossa and parieto-occipital infarcts on MRI remain occult by CT. No acute intracranial hemorrhage identified. No midline shift, mass effect, or evidence of intracranial mass lesion. No ventriculomegaly. Chronic anterior communicating artery embolization coil pack. No new cerebral infarct identified. Calvarium and skull base: No acute osseous abnormality identified. Paranasal sinuses: Visualized paranasal sinuses and mastoids are stable and well aerated. Orbits: No acute orbit or scalp soft tissue finding. CTA NECK Skeleton: No acute osseous abnormality identified. Upper chest: Negative. Other neck: Negative. Aortic arch: 3 vessel arch configuration. Tortuous proximal great vessels but mild arch atherosclerosis. Right carotid system: Mildly tortuous brachiocephalic artery. Tortuous right CCA origin. Negative right carotid bifurcation. No stenosis to the skull base. Left carotid system: Tortuosity with no atherosclerosis or stenosis. Vertebral arteries: Tortuous proximal right subclavian artery with no plaque or stenosis. Normal right vertebral artery origin. Right vertebral artery appears patent and normal to the skull base. Minimal plaque in the proximal left subclavian artery. Normal left vertebral artery origin. Codominant left vertebral artery is patent and within normal limits to the skull base. CTA HEAD Posterior circulation: Codominant vertebral V4 segments are patent without stenosis to the vertebrobasilar  junction. Both a ICAs appear dominant. A small left PICA is patent. Patent basilar artery without stenosis. Patent SCA and PCA origins. Posterior communicating arteries are diminutive or absent. Bilateral PCA branches are within normal limits. Anterior circulation: Both ICA siphons are patent. Mild-to-moderate calcified plaque on the left with only mild distal cavernous stenosis on that side. Mild right siphon calcified plaque without stenosis. Normal ophthalmic artery origins. Patent carotid  termini. Patent MCA and ACA origins. Anterior communicating artery region embolization coil pack. Bilateral ACA branches remain patent, the right A2 appears dominant. No adverse features identified. Left MCA M1 segment and bifurcation are patent without stenosis. Right MCA M1 segment and bifurcation are patent without stenosis. Bilateral MCA branches are within normal limits. Venous sinuses: Patent. Anatomic variants: None significant. Review of the MIP images confirms the above findings IMPRESSION: 1. Negative for large vessel occlusion. No atherosclerosis in the neck. Mild for age atherosclerosis at the skull base with no significant arterial stenosis. 2. 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 No hemorrhage or mass effect. The remaining small ischemic foci by MRI are occult by CT. 3. No new intracranial abnormality. Electronically Signed   By: Genevie Ann M.D.   On: 05/31/2022 10:44   MR ANGIO HEAD WO CONTRAST  Result Date: 05/30/2022 CLINICAL DATA:  Left-sided weakness and facial droop following cerebral arteriogram. Pontine infarct. EXAM: MRA HEAD WITHOUT CONTRAST TECHNIQUE: Angiographic images of the Circle of Willis were acquired using MRA technique without intravenous contrast. COMPARISON:  Cerebral arteriogram 05/29/2022 FINDINGS: Anterior circulation: Internal carotid arteries are within normal limits high cervical segments through the ICA termini bilaterally. Some attenuation proximal A2 segments likely artifact from the coil pack. The A1 and M1 segments are normal bilaterally. MCA bifurcations are within limits. The ACA and MCA branch vessels are within normal. Posterior circulation: The vertebral arteries are codominant. Vertebrobasilar junction and basilar artery are normal. Prominent AICA vessels are noted bilaterally. Both posterior cerebral arteries originate from basilar tip. The PCA branch vessels are within normal limits. Anatomic variants: None Other: None. IMPRESSION:  Normal MRA Circle of Willis. Electronically Signed   By: San Morelle M.D.   On: 05/30/2022 17:35   MR Angiogram Neck W or Wo Contrast  Result Date: 05/30/2022 CLINICAL DATA:  Left-sided weakness and facial droop. Acute brainstem infarct. Status post cerebral arteriogram yesterday. EXAM: MRA NECK WITHOUT AND WITH CONTRAST TECHNIQUE: Multiplanar and multiecho pulse sequences of the neck were obtained without and with intravenous contrast. Angiographic images of the neck were obtained using MRA technique without and with intravenous contrast. CONTRAST:  4.82m GADAVIST GADOBUTROL 1 MMOL/ML IV SOLN COMPARISON:  Cerebral arteriogram 05/29/2022 FINDINGS: The time-of-flight images are severely degraded by patient motion. The postcontrast images demonstrate a common origin of the left common carotid artery and innominate artery. Right common carotid artery is within normal limits. Bifurcation unremarkable. Cervical right ICA is normal. The left common carotid artery is. Bifurcation is within normal limits. Cervical left ICA is normal. The vertebral arteries are codominant. Both vertebral arteries originate from the subclavian arteries without significant stenosis. No significant stenosis is present in either vertebral artery in the neck. Intracranial vertebral arteries, vertebrobasilar junction and basilar artery are normal. IMPRESSION: 1. Normal MRA of the neck. 2. No acute or focal abnormality to explain brainstem infarct. Electronically Signed   By: CSan MorelleM.D.   On: 05/30/2022 17:34   MR BRAIN WO CONTRAST  Result Date: 05/30/2022 CLINICAL DATA:  A deficit, acute, stroke suspected.  Left-sided weakness facial droop. Cerebral are angiography yesterday. EXAM: MRI HEAD WITHOUT CONTRAST TECHNIQUE: Multiplanar, multiecho pulse sequences of the brain and surrounding structures were obtained without intravenous contrast. COMPARISON:  None Available. FINDINGS: Brain: The diffusion-weighted images  demonstrate acute nonhemorrhagic right paramedian pontine infarct measuring up to 2 cm in maximal diameter. Additional punctate foci of restricted diffusion are present in the right cerebellum and right greater than left occipital lobes. No anterior circulation infarcts are present. T2 signal changes are associated with the areas of acute infarct. No other significant white matter disease is present. The ventricles are of normal size. No significant extraaxial fluid collection is present. The internal auditory canals are within normal limits. Vascular: Flow is present in the major intracranial arteries. Skull and upper cervical spine: The craniocervical junction is normal. Upper cervical spine is within normal limits. Marrow signal is unremarkable. Sinuses/Orbits: The paranasal sinuses and mastoid air cells are clear. Bilateral lens replacements are noted. Globes and orbits are otherwise unremarkable. IMPRESSION: 1. 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 2. Additional punctate foci of acute nonhemorrhagic infarct involving the right cerebellum and right greater than left occipital lobes. These results were called by telephone at the time of interpretation on 05/30/2022 at 5:23 pm to provider Dr. Truett Mainland, who verbally acknowledged these results. Electronically Signed   By: San Morelle M.D.   On: 05/30/2022 17:24   IR ANGIO VERTEBRAL SEL VERTEBRAL UNI R MOD SED  Result Date: 05/29/2022 INDICATION: 71 year old female with past medical history significant for colitis, asthma, DM, hypertension, anemia and psoriatic arthritis. 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 She returns today for a diagnostic cerebral angiogram to evaluate treatment outcome. EXAM: ULTRASOUND-GUIDED VASCULAR ACCESS DIAGNOSTIC CEREBRAL ANGIOGRAM COMPARISON:  Cerebral angiogram October 01, 2021. MEDICATIONS: 5,000 IU heparin and 300  mcg nitroglycerin intra-arterially to right radial artery. ANESTHESIA/SEDATION: Moderate (conscious) sedation was employed during this procedure. A total of Versed 1 mg and Fentanyl 50 mcg was administered intravenously by the radiology nurse. Total intra-service moderate Sedation Time: 41 minutes. The patient's level of consciousness and vital signs were monitored continuously by radiology nursing throughout the procedure under my direct supervision. CONTRAST:  50 mL of Omnipaque 300 milligram/mL FLUOROSCOPY: Radiation Exposure Index (as provided by the fluoroscopic device): 453 mGy Kerma COMPLICATIONS: None immediate. TECHNIQUE: Informed written consent was obtained from the patient after a thorough discussion of the procedural risks, benefits and alternatives. All questions were addressed. Maximal Sterile Barrier Technique was utilized including caps, mask, sterile gowns, sterile gloves, sterile drape, hand hygiene and skin antiseptic. A timeout was performed prior to the initiation of the procedure. Using the modified Seldinger technique and a micropuncture kit, access was gained to the distal right radial artery at the anatomical snuffbox and a 5 French sheath was placed. Real-time ultrasound guidance was utilized for vascular access including the acquisition of a permanent ultrasound image documenting patency of the accessed vessel. Slow intra arterial infusion of 5,000 IU heparin and 300 mcg nitroglycerin diluted in patient's own blood was performed. No significant fluctuation in patient's blood pressure seen. Then, a right radial artery angiogram was obtained via sheath side port. Next, a 5 [place] Simmons 2 glide catheter was navigated over a 0.035" Terumo Glidewire into the right subclavian artery under fluoroscopic guidance. Frontal roadmap of the neck were obtained. Under fluoroscopy, the catheter was placed in the right vertebral artery. Angiograms with frontal, lateral, magnified right anterior oblique  and magnified lateral views of the head were obtained. The catheter was subsequently placed into the right common carotid artery. Frontal and lateral angiograms of the neck were obtained. Using biplane roadmap guidance, the catheter was placed into the right internal carotid artery. Angiograms with frontal, lateral, magnified right anterior oblique, magnified left anterior oblique and magnified lateral views of the head were obtained. Due to radial artery vasospasm resulting in significant pain and difficulty maneuvering the catheter common no images were obtained of the left anterior circulation. The catheter was subsequently withdrawn. An inflatable band was placed and inflated over the right hand access site. The vascular sheath was withdrawn and the band was slowly deflated until brisk flow was noted through the arteriotomy site. At this point, the band was reinflated with additional 2 cc of air to obtain patent hemostasis. FINDINGS: Right radial artery ultrasound and right radial artery angiogram: The caliber of the distal right radial artery is appropriate for angiogram access. The right radial artery and the right ulnar artery have normal course and caliber, noting mild to moderate vasospasm in the mid radial artery. No significant anatomical variants noted. Right CCA angiograms: Cervical angiograms show normal course and caliber of the visualized right common carotid and internal carotid arteries. There are no significant stenoses. Right ICA angiograms: Status post treatment of a right A1-A2/ACA junction aneurysm with a web device. There is complete aneurysm occlusion without evidence of residual or recurrent aneurysm. No de Novo aneurysm identified. There is brisk vascular contrast filling of the right ACA and MCA vascular trees. Luminal caliber is smooth and tapering. No abnormally high-flow, early draining veins are seen. No regions of abnormal hypervascularity are noted. The visualized dural sinuses are  patent. Right vertebral artery angiograms: The right vertebral artery, basilar artery, and bilateral posterior cerebral arteries are unremarkable. Bilateral AICA-PICA complexes are noted and appear unremarkable. Luminal caliber is smooth and tapering. No aneurysms or abnormally high-flow, early draining veins are seen. No regions of abnormal hypervascularity are noted. The visualized dural sinuses are patent. Right subclavian artery roadmap: The right subclavian artery, proximal right vertebral artery and visualized branches of the thyrocervical trunk are unremarkable. PROCEDURE: No intervention performed. 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 No evidence of residual or recurrent aneurysm. PLAN: A follow-up MR angiogram within 1 year will be obtained. Electronically Signed   By: Pedro Earls M.D.   On: 05/29/2022 11:47   IR ANGIO INTRA EXTRACRAN SEL INTERNAL CAROTID UNI R MOD SED  Result Date: 05/29/2022 INDICATION: 71 year old female with past medical history significant for colitis, asthma, DM, hypertension, anemia and psoriatic arthritis. 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 She returns today for a diagnostic cerebral angiogram to evaluate treatment outcome. EXAM: ULTRASOUND-GUIDED VASCULAR ACCESS DIAGNOSTIC CEREBRAL ANGIOGRAM COMPARISON:  Cerebral angiogram October 01, 2021. MEDICATIONS: 5,000 IU heparin and 300 mcg nitroglycerin intra-arterially to right radial artery. ANESTHESIA/SEDATION: Moderate (conscious) sedation was employed during this procedure. A total of Versed 1 mg and Fentanyl 50 mcg was administered intravenously by the radiology nurse. Total intra-service moderate Sedation Time: 41 minutes. The patient's level of consciousness and vital signs were monitored continuously by radiology nursing throughout  the procedure under my direct supervision. CONTRAST:  50 mL of Omnipaque 300 milligram/mL FLUOROSCOPY: Radiation Exposure Index (as provided by the fluoroscopic device): 578 mGy Kerma COMPLICATIONS: None immediate. TECHNIQUE: Informed written consent was obtained from  the patient after a thorough discussion of the procedural risks, benefits and alternatives. All questions were addressed. Maximal Sterile Barrier Technique was utilized including caps, mask, sterile gowns, sterile gloves, sterile drape, hand hygiene and skin antiseptic. A timeout was performed prior to the initiation of the procedure. Using the modified Seldinger technique and a micropuncture kit, access was gained to the distal right radial artery at the anatomical snuffbox and a 5 French sheath was placed. Real-time ultrasound guidance was utilized for vascular access including the acquisition of a permanent ultrasound image documenting patency of the accessed vessel. Slow intra arterial infusion of 5,000 IU heparin and 300 mcg nitroglycerin diluted in patient's own blood was performed. No significant fluctuation in patient's blood pressure seen. Then, a right radial artery angiogram was obtained via sheath side port. Next, a 5 [place] Simmons 2 glide catheter was navigated over a 0.035" Terumo Glidewire into the right subclavian artery under fluoroscopic guidance. Frontal roadmap of the neck were obtained. Under fluoroscopy, the catheter was placed in the right vertebral artery. Angiograms with frontal, lateral, magnified right anterior oblique and magnified lateral views of the head were obtained. The catheter was subsequently placed into the right common carotid artery. Frontal and lateral angiograms of the neck were obtained. Using biplane roadmap guidance, the catheter was placed into the right internal carotid artery. Angiograms with frontal, lateral, magnified right anterior oblique, magnified left anterior oblique and magnified lateral views  of the head were obtained. Due to radial artery vasospasm resulting in significant pain and difficulty maneuvering the catheter common no images were obtained of the left anterior circulation. The catheter was subsequently withdrawn. An inflatable band was placed and inflated over the right hand access site. The vascular sheath was withdrawn and the band was slowly deflated until brisk flow was noted through the arteriotomy site. At this point, the band was reinflated with additional 2 cc of air to obtain patent hemostasis. FINDINGS: Right radial artery ultrasound and right radial artery angiogram: The caliber of the distal right radial artery is appropriate for angiogram access. The right radial artery and the right ulnar artery have normal course and caliber, noting mild to moderate vasospasm in the mid radial artery. No significant anatomical variants noted. Right CCA angiograms: Cervical angiograms show normal course and caliber of the visualized right common carotid and internal carotid arteries. There are no significant stenoses. Right ICA angiograms: Status post treatment of a right A1-A2/ACA junction aneurysm with a web device. There is complete aneurysm occlusion without evidence of residual or recurrent aneurysm. No de Novo aneurysm identified. There is brisk vascular contrast filling of the right ACA and MCA vascular trees. Luminal caliber is smooth and tapering. No abnormally high-flow, early draining veins are seen. No regions of abnormal hypervascularity are noted. The visualized dural sinuses are patent. Right vertebral artery angiograms: The right vertebral artery, basilar artery, and bilateral posterior cerebral arteries are unremarkable. Bilateral AICA-PICA complexes are noted and appear unremarkable. Luminal caliber is smooth and tapering. No aneurysms or abnormally high-flow, early draining veins are seen. No regions of abnormal hypervascularity are noted. The visualized dural sinuses are patent.  Right subclavian artery roadmap: The right subclavian artery, proximal right vertebral artery and visualized branches of the thyrocervical trunk are unremarkable. PROCEDURE: No intervention performed. 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 No evidence of residual or recurrent aneurysm. PLAN: A follow-up MR angiogram within 1 year will be obtained. Electronically Signed  By: Pedro Earls M.D.   On: 05/29/2022 11:47   IR US Guide Vasc Access Right  Result Date: 05/29/2022 INDICATION: 71 year old female with past medical history significant for colitis, asthma, DM, hypertension, anemia and psoriatic arthritis. 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 She returns today for a diagnostic cerebral angiogram to evaluate treatment outcome. EXAM: ULTRASOUND-GUIDED VASCULAR ACCESS DIAGNOSTIC CEREBRAL ANGIOGRAM COMPARISON:  Cerebral angiogram October 01, 2021. MEDICATIONS: 5,000 IU heparin and 300 mcg nitroglycerin intra-arterially to right radial artery. ANESTHESIA/SEDATION: Moderate (conscious) sedation was employed during this procedure. A total of Versed 1 mg and Fentanyl 50 mcg was administered intravenously by the radiology nurse. Total intra-service moderate Sedation Time: 41 minutes. The patient's level of consciousness and vital signs were monitored continuously by radiology nursing throughout the procedure under my direct supervision. CONTRAST:  50 mL of Omnipaque 300 milligram/mL FLUOROSCOPY: Radiation Exposure Index (as provided by the fluoroscopic device): 062 mGy Kerma COMPLICATIONS: None immediate. TECHNIQUE: Informed written consent was obtained from the patient after a thorough discussion of the procedural risks, benefits and alternatives. All questions were addressed. Maximal Sterile Barrier Technique was utilized  including caps, mask, sterile gowns, sterile gloves, sterile drape, hand hygiene and skin antiseptic. A timeout was performed prior to the initiation of the procedure. Using the modified Seldinger technique and a micropuncture kit, access was gained to the distal right radial artery at the anatomical snuffbox and a 5 French sheath was placed. Real-time ultrasound guidance was utilized for vascular access including the acquisition of a permanent ultrasound image documenting patency of the accessed vessel. Slow intra arterial infusion of 5,000 IU heparin and 300 mcg nitroglycerin diluted in patient's own blood was performed. No significant fluctuation in patient's blood pressure seen. Then, a right radial artery angiogram was obtained via sheath side port. Next, a 5 [place] Simmons 2 glide catheter was navigated over a 0.035" Terumo Glidewire into the right subclavian artery under fluoroscopic guidance. Frontal roadmap of the neck were obtained. Under fluoroscopy, the catheter was placed in the right vertebral artery. Angiograms with frontal, lateral, magnified right anterior oblique and magnified lateral views of the head were obtained. The catheter was subsequently placed into the right common carotid artery. Frontal and lateral angiograms of the neck were obtained. Using biplane roadmap guidance, the catheter was placed into the right internal carotid artery. Angiograms with frontal, lateral, magnified right anterior oblique, magnified left anterior oblique and magnified lateral views of the head were obtained. Due to radial artery vasospasm resulting in significant pain and difficulty maneuvering the catheter common no images were obtained of the left anterior circulation. The catheter was subsequently withdrawn. An inflatable band was placed and inflated over the right hand access site. The vascular sheath was withdrawn and the band was slowly deflated until brisk flow was noted through the arteriotomy site. At  this point, the band was reinflated with additional 2 cc of air to obtain patent hemostasis. FINDINGS: Right radial artery ultrasound and right radial artery angiogram: The caliber of the distal right radial artery is appropriate for angiogram access. The right radial artery and the right ulnar artery have normal course and caliber, noting mild to moderate vasospasm in the mid radial artery. No significant anatomical variants noted. Right CCA angiograms: Cervical angiograms show normal course and caliber of the visualized right common carotid and internal carotid arteries. There are no significant stenoses. Right ICA angiograms: Status post treatment of a right  A1-A2/ACA junction aneurysm with a web device. There is complete aneurysm occlusion without evidence of residual or recurrent aneurysm. No de Novo aneurysm identified. There is brisk vascular contrast filling of the right ACA and MCA vascular trees. Luminal caliber is smooth and tapering. No abnormally high-flow, early draining veins are seen. No regions of abnormal hypervascularity are noted. The visualized dural sinuses are patent. Right vertebral artery angiograms: The right vertebral artery, basilar artery, and bilateral posterior cerebral arteries are unremarkable. Bilateral AICA-PICA complexes are noted and appear unremarkable. Luminal caliber is smooth and tapering. No aneurysms or abnormally high-flow, early draining veins are seen. No regions of abnormal hypervascularity are noted. The visualized dural sinuses are patent. Right subclavian artery roadmap: The right subclavian artery, proximal right vertebral artery and visualized branches of the thyrocervical trunk are unremarkable. PROCEDURE: No intervention performed. 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 No evidence of residual or recurrent aneurysm. PLAN: A follow-up MR angiogram within 1 year will be  obtained. Electronically Signed   By: Pedro Earls M.D.   On: 05/29/2022 11:47      Subjective:  Patient denies any complaints today, reports her weakness is better. Discharge Exam: Vitals:   06/01/22 0817 06/01/22 1200  BP: 140/77 120/60  Pulse: 65 (!) 56  Resp: 16 17  Temp: 98.2 F (36.8 C) 98.2 F (36.8 C)  SpO2: 100% 98%   Vitals:   06/01/22 0023 06/01/22 0431 06/01/22 0817 06/01/22 1200  BP: 132/83 119/74 140/77 120/60  Pulse: 64 64 65 (!) 56  Resp: _0 Temp: 97.6 F (36.4 C) 98.2 F (36.8 C) 98.2 F (36.8 C) 98.2 F (36.8 C)  TempSrc: Oral Oral Oral Oral  SpO2: 100% 97% 100% 98%  Weight:      Height:        General: Pt is alert, awake, not in acute distress, left facial droop, drift and coordination significantly improved, speech is clear Cardiovascular: RRR, S1/S2 +, no rubs, no gallops Respiratory: CTA bilaterally, no wheezing, no rhonchi Abdominal: Soft, NT, ND, bowel sounds + Extremities: no edema, no cyanosis    The results of significant diagnostics from this hospitalization (including imaging, microbiology, ancillary and laboratory) are listed below for reference.     Microbiology: Recent Results (from the past 240 hour(s))  Resp Panel by RT-PCR (Flu A&B, Covid) Anterior Nasal Swab     Status: None   Collection Time: 05/30/22 12:21 PM   Specimen: Anterior Nasal Swab  Result Value Ref Range Status   SARS Coronavirus 2 by RT PCR NEGATIVE NEGATIVE Final    Comment: (NOTE) SARS-CoV-2 target nucleic acids are NOT DETECTED.  The SARS-CoV-2 RNA is generally detectable in upper respiratory specimens during the acute phase of infection. The lowest concentration of SARS-CoV-2 viral copies this assay can detect is 138 copies/mL. A negative result does not preclude SARS-Cov-2 infection and should not be used as the sole basis for treatment or other patient management decisions. A negative result may occur with  improper specimen  collection/handling, submission of specimen other than nasopharyngeal swab, presence of viral mutation(s) within the areas targeted by this assay, and inadequate number of viral copies(<138 copies/mL). A negative result must be combined with clinical observations, patient history, and epidemiological information. The expected result is Negative.  Fact Sheet for Patients:  EntrepreneurPulse.com.au  Fact Sheet for Healthcare Providers:  IncredibleEmployment.be  This test is no t yet approved or cleared  by the [place] and  has been authorized for detection and/or diagnosis of SARS-CoV-2 by FDA under an Emergency Use Authorization (EUA). This EUA will remain  in effect (meaning this test can be used) for the duration of the COVID-19 declaration under Section 564(b)(1) of the Act, 21 U.S.C.section 360bbb-3(b)(1), unless the authorization is terminated  or revoked sooner.       Influenza A by PCR NEGATIVE NEGATIVE Final   Influenza B by PCR NEGATIVE NEGATIVE Final    Comment: (NOTE) The Xpert Xpress SARS-CoV-2/FLU/RSV plus assay is intended as an aid in the diagnosis of influenza from Nasopharyngeal swab specimens and should not be used as a sole basis for treatment. Nasal washings and aspirates are unacceptable for Xpert Xpress SARS-CoV-2/FLU/RSV testing.  Fact Sheet for Patients: EntrepreneurPulse.com.au  Fact Sheet for Healthcare Providers: IncredibleEmployment.be  This test is not yet approved or cleared by the [place] FDA and has been authorized for detection and/or diagnosis of SARS-CoV-2 by FDA under an Emergency Use Authorization (EUA). This EUA will remain in effect (meaning this test can be used) for the duration of the COVID-19 declaration under Section 564(b)(1) of the Act, 21 U.S.C. section 360bbb-3(b)(1), unless the authorization is terminated or revoked.  Performed at [facility], [place] [address], [place], [place] [address]      Labs: BNP (last 3 results) No results for input(s): "BNP" in the last 8760 hours. Basic Metabolic Panel: Recent Labs  Lab 05/29/22 0655 05/30/22 1241 05/30/22 1249 06/01/22 0223  NA 141 141 142 141  K 4.0 3.8 3.8 3.8  CL 108 111 107 107  CO2 27 24  --  25  GLUCOSE 63* 190* 180* 86  BUN _0 CREATININE 0.83 0.88 0.80 0.87  CALCIUM 8.5* 8.2*  --  8.4*   Liver Function Tests: Recent Labs  Lab 05/30/22 1241  AST 28  ALT 23  ALKPHOS 78  BILITOT 0.2*  PROT 5.2*  ALBUMIN 3.0*   No results for input(s): "LIPASE", "AMYLASE" in the last 168 hours. No results for input(s): "AMMONIA" in the last 168 hours. CBC: Recent Labs  Lab 05/29/22 0655 05/30/22 1241 05/30/22 1249 06/01/22 0223  WBC 5.1 6.9  --  5.4  NEUTROABS 3.1 5.0  --   --   HGB 10.8* 10.6* 11.2* 9.6*  HCT 34.4* 33.1* 33.0* 30.5*  MCV 90.8 90.4  --  89.4  PLT 263 258  --  208   Cardiac Enzymes: No results for input(s): "CKTOTAL", "CKMB", "CKMBINDEX", "TROPONINI" in the last 168 hours. BNP: Invalid input(s): "POCBNP" CBG: Recent Labs  Lab 05/29/22 0653 05/29/22 0730 05/30/22 1208 06/01/22 0820 06/01/22 1159  GLUCAP 51* 177* 174* 185* 144*   D-Dimer No results for input(s): "DDIMER" in the last 72 hours. Hgb A1c Recent Labs    05/31/22 0455  HGBA1C 5.0   Lipid Profile Recent Labs    05/31/22 0455  CHOL 120  HDL 49  LDLCALC 55  TRIG 80  CHOLHDL 2.4   Thyroid function studies No results for input(s): "TSH", "T4TOTAL", "T3FREE", "THYROIDAB" in the last 72 hours.  Invalid input(s): "FREET3" Anemia work up No results for input(s): "VITAMINB12", "FOLATE", "FERRITIN", "TIBC", "IRON", "RETICCTPCT" in the last 72 hours. Urinalysis    Component Value Date/Time   COLORURINE YELLOW 03/16/2017 1009   APPEARANCEUR [place] (A) 03/16/2017 1009   LABSPEC 1.020 03/16/2017 1009   PHURINE 6.0 03/16/2017 1009   GLUCOSEU  NEGATIVE 03/16/2017 1009  HGBUR NEGATIVE 03/16/2017 1009   BILIRUBINUR NEGATIVE 03/16/2017 1009   [place] 03/16/2017 1009   UROBILINOGEN 0.2 03/16/2017 1009   NITRITE NEGATIVE 03/16/2017 1009   LEUKOCYTESUR MODERATE (A) 03/16/2017 1009   Sepsis Labs Recent Labs  Lab 05/29/22 0655 05/30/22 1241 06/01/22 0223  WBC 5.1 6.9 5.4   Microbiology Recent Results (from the past 240 hour(s))  Resp Panel by RT-PCR (Flu A&B, Covid) Anterior Nasal Swab     Status: None   Collection Time: 05/30/22 12:21 PM   Specimen: Anterior Nasal Swab  Result Value Ref Range Status   SARS Coronavirus 2 by RT PCR NEGATIVE NEGATIVE Final    Comment: (NOTE) SARS-CoV-2 target nucleic acids are NOT DETECTED.  The SARS-CoV-2 RNA is generally detectable in upper respiratory specimens during the acute phase of infection. The lowest concentration of SARS-CoV-2 viral copies this assay can detect is 138 copies/mL. A negative result does not preclude SARS-Cov-2 infection and should not be used as the sole basis for treatment or other patient management decisions. A negative result may occur with  improper specimen collection/handling, submission of specimen other than nasopharyngeal swab, presence of viral mutation(s) within the areas targeted by this assay, and inadequate number of viral copies(<138 copies/mL). A negative result must be combined with clinical observations, patient history, and epidemiological information. The expected result is Negative.  Fact Sheet for Patients:  EntrepreneurPulse.com.au  Fact Sheet for Healthcare Providers:  IncredibleEmployment.be  This test is no t yet approved or cleared by the [place] FDA and  has been authorized for detection and/or diagnosis of SARS-CoV-2 by FDA under an Emergency Use Authorization (EUA). This EUA will remain  in effect (meaning this test can be used) for the duration of the COVID-19 declaration  under Section 564(b)(1) of the Act, 21 U.S.C.section 360bbb-3(b)(1), unless the authorization is terminated  or revoked sooner.       Influenza A by PCR NEGATIVE NEGATIVE Final   Influenza B by PCR NEGATIVE NEGATIVE Final    Comment: (NOTE) The Xpert Xpress SARS-CoV-2/FLU/RSV plus assay is intended as an aid in the diagnosis of influenza from Nasopharyngeal swab specimens and should not be used as a sole basis for treatment. Nasal washings and aspirates are unacceptable for Xpert Xpress SARS-CoV-2/FLU/RSV testing.  Fact Sheet for Patients: EntrepreneurPulse.com.au  Fact Sheet for Healthcare Providers: IncredibleEmployment.be  This test is not yet approved or cleared by the [place] FDA and has been authorized for detection and/or diagnosis of SARS-CoV-2 by FDA under an Emergency Use Authorization (EUA). This EUA will remain in effect (meaning this test can be used) for the duration of the COVID-19 declaration under Section 564(b)(1) of the Act, 21 U.S.C. section 360bbb-3(b)(1), unless the authorization is terminated or revoked.  Performed at [facility], [place] [address] [place], [place] [address]      Time coordinating discharge: Over 30 minutes  SIGNED:   Phillips Climes, MD  [facility] 06/01/2022, 1:12 PM Pager   If 7PM-7AM, please contact night-coverage [email or web address] Password [email or web address]

## 2022-06-01 NOTE — TOC Transition Note (Signed)
Transition of Care [facility]) - CM/SW Discharge Note   Patient Details  Name: Ashley Gross MRN: [id number] Date of Birth: [birth year]  Transition of [facility]) CM/SW Contact:  Cyndi Bender, RN Phone Number: 06/01/2022, 10:23 AM   Clinical Narrative:     Patient stable for discharge. Patient prefers OP rehab vs Home Health. Referral sent to [facility] OP rehab. Patient states she has all needed DME. Daughter can transport home.   Final next level of care: OP Rehab Barriers to Discharge: Barriers Resolved   Patient Goals and CMS Choice Patient states their goals for this hospitalization and ongoing recovery are:: return home      Discharge Placement             home          Discharge Plan and Services                   OP rehab                  Social Determinants of Health (SDOH) Interventions     Readmission Risk Interventions     No data to display

## 2022-06-01 NOTE — Discharge Instructions (Signed)
Follow with Primary MD Ashley Pheasant, MD in 7 days   Get CBC, CMP, checked  by Primary MD next visit.    Activity: As tolerated with Full fall precautions use walker/cane & assistance as needed   Disposition Home   Diet: Heart Healthy   On your next visit with your primary care physician please Get Medicines reviewed and adjusted.   Please request your Prim.MD to go over all Hospital Tests and Procedure/Radiological results at the follow up, please get all Hospital records sent to your Prim MD by signing hospital release before you go home.   If you experience worsening of your admission symptoms, develop shortness of breath, life threatening emergency, suicidal or homicidal thoughts you must seek medical attention immediately by calling 911 or calling your MD immediately  if symptoms less severe.  You Must read complete instructions/literature along with all the possible adverse reactions/side effects for all the Medicines you take and that have been prescribed to you. Take any new Medicines after you have completely understood and accpet all the possible adverse reactions/side effects.   Do not drive, operating heavy machinery, perform activities at heights, swimming or participation in water activities or provide baby sitting services if your were admitted for syncope or siezures until you have seen by Primary MD or a Neurologist and advised to do so again.  Do not drive when taking Pain medications.    Do not take more than prescribed Pain, Sleep and Anxiety Medications  Special Instructions: If you have smoked or chewed Tobacco  in the last 2 yrs please stop smoking, stop any regular Alcohol  and or any Recreational drug use.  Wear Seat belts while driving.   Please note  You were cared for by a hospitalist during your hospital stay. If you have any questions about your discharge medications or the care you received while you were in the hospital after you are discharged,  you can call the unit and asked to speak with the hospitalist on call if the hospitalist that took care of you is not available. Once you are discharged, your primary care physician will handle any further medical issues. Please note that NO REFILLS for any discharge medications will be authorized once you are discharged, as it is imperative that you return to your primary care physician (or establish a relationship with a primary care physician if you do not have one) for your aftercare needs so that they can reassess your need for medications and monitor your lab values.

## 2022-06-01 NOTE — Plan of Care (Signed)
Problem: Education: Goal: Understanding of CV disease, CV risk reduction, and recovery process will improve Outcome: Adequate for Discharge Goal: Individualized Educational Video(s) Outcome: Adequate for Discharge   Problem: Activity: Goal: Ability to return to baseline activity level will improve Outcome: Adequate for Discharge   Problem: Cardiovascular: Goal: Ability to achieve and maintain adequate cardiovascular perfusion will improve Outcome: Adequate for Discharge Goal: Vascular access site(s) Level 0-1 will be maintained Outcome: Adequate for Discharge   Problem: Health Behavior/Discharge Planning: Goal: Ability to safely manage health-related needs after discharge will improve Outcome: Adequate for Discharge   Problem: Education: Goal: Knowledge of General Education information will improve Description: Including pain rating scale, medication(s)/side effects and non-pharmacologic comfort measures Outcome: Adequate for Discharge   Problem: Health Behavior/Discharge Planning: Goal: Ability to manage health-related needs will improve Outcome: Adequate for Discharge   Problem: Clinical Measurements: Goal: Ability to maintain clinical measurements within normal limits will improve Outcome: Adequate for Discharge Goal: Will remain free from infection Outcome: Adequate for Discharge Goal: Diagnostic test results will improve Outcome: Adequate for Discharge Goal: Respiratory complications will improve Outcome: Adequate for Discharge Goal: Cardiovascular complication will be avoided Outcome: Adequate for Discharge   Problem: Activity: Goal: Risk for activity intolerance will decrease Outcome: Adequate for Discharge   Problem: Nutrition: Goal: Adequate nutrition will be maintained Outcome: Adequate for Discharge   Problem: Coping: Goal: Level of anxiety will decrease Outcome: Adequate for Discharge   Problem: Elimination: Goal: Will not experience complications  related to bowel motility Outcome: Adequate for Discharge Goal: Will not experience complications related to urinary retention Outcome: Adequate for Discharge   Problem: Pain Managment: Goal: General experience of comfort will improve Outcome: Adequate for Discharge   Problem: Safety: Goal: Ability to remain free from injury will improve Outcome: Adequate for Discharge   Problem: Skin Integrity: Goal: Risk for impaired skin integrity will decrease Outcome: Adequate for Discharge   Problem: Acute Rehab OT Goals (only OT should resolve) Goal: Pt. Will Perform Upper Body Dressing Outcome: Adequate for Discharge Goal: Pt. Will Perform Lower Body Dressing Outcome: Adequate for Discharge Goal: Pt. Will Transfer To Toilet Outcome: Adequate for Discharge Goal: Pt. Will Perform Tub/Shower Transfer Outcome: Adequate for Discharge Goal: OT Additional ADL Goal #1 Outcome: Adequate for Discharge   Problem: Acute Rehab OT Goals (only OT should resolve) Goal: Pt/Caregiver Will Perform Home Exercise Program Outcome: Adequate for Discharge   Problem: Education: Goal: Ability to describe self-care measures that may prevent or decrease complications (Diabetes Survival Skills Education) will improve Outcome: Adequate for Discharge Goal: Individualized Educational Video(s) Outcome: Adequate for Discharge   Problem: Coping: Goal: Ability to adjust to condition or change in health will improve Outcome: Adequate for Discharge   Problem: Fluid Volume: Goal: Ability to maintain a balanced intake and output will improve Outcome: Adequate for Discharge   Problem: Health Behavior/Discharge Planning: Goal: Ability to identify and utilize available resources and services will improve Outcome: Adequate for Discharge Goal: Ability to manage health-related needs will improve Outcome: Adequate for Discharge   Problem: Metabolic: Goal: Ability to maintain appropriate glucose levels will  improve Outcome: Adequate for Discharge   Problem: Nutritional: Goal: Maintenance of adequate nutrition will improve Outcome: Adequate for Discharge Goal: Progress toward achieving an optimal weight will improve Outcome: Adequate for Discharge   Problem: Skin Integrity: Goal: Risk for impaired skin integrity will decrease Outcome: Adequate for Discharge   Problem: Tissue Perfusion: Goal: Adequacy of tissue perfusion will improve Outcome: Adequate for Discharge

## 2022-06-02 ENCOUNTER — Telehealth: Payer: Self-pay

## 2022-06-02 NOTE — Telephone Encounter (Signed)
Transition Care Management Unsuccessful Follow-up Telephone Call  Date of discharge and from where:  06/01/22 [facility]  Attempts:  1st Attempt  Reason for unsuccessful TCM follow-up call:  Unable to reach patient. Will follow.

## 2022-06-03 NOTE — Telephone Encounter (Signed)
Transition Care Management Follow-up Telephone Call Date of discharge and from where: 06/01/22 [facility]  How have you been since you were released from the hospital? I am doing really good. No slurred speech during conversation with [facility]. Denies double vision, slurred speech, difficulty resuming daily activities, and all other harmful symptoms. Walker in use.  Any questions or concerns? Yes. Would like confirmation PCP received orders for PT  Items Reviewed: Did the pt receive and understand the discharge instructions provided? Yes  Medications obtained and verified? Yes  Any new allergies since your discharge? No  Dietary orders reviewed? Yes Do you have support at home? Yes .  Home Care and Equipment/Supplies: Were home health services ordered? Outpatient PT/OT  Functional Questionnaire: (I = Independent and D = Dependent) ADLs: I  Bathing/Dressing- I  Meal Prep- I  Eating- I  Maintaining continence- Depend briefs in use  Transferring/Ambulation- Walker  Managing Meds- I  Follow up appointments reviewed:  PCP Hospital f/u appt confirmed? Yes  Scheduled to see PCP on 06/12/22 @ 830.Originally scheduled for cpe.  Are transportation arrangements needed? No  If their condition worsens, is the pt aware to call PCP or go to the Emergency Dept.? Yes Was the patient provided with contact information for the PCP's office or ED? Yes Was to pt encouraged to call back with questions or concerns? Yes

## 2022-06-03 NOTE — Telephone Encounter (Signed)
Reviewed.  Pt questioning if we received orders for home health PT.  I have not seen orders.  Have you seen?

## 2022-06-04 NOTE — Telephone Encounter (Signed)
Signed and placed in box.  Please notify pt.

## 2022-06-04 NOTE — Telephone Encounter (Signed)
Yes, we received orders for PT/OT eval on pt - placed in quick sign

## 2022-06-04 NOTE — Telephone Encounter (Signed)
Orders faxed, pt aware

## 2022-06-10 ENCOUNTER — Ambulatory Visit: Payer: PPO | Attending: Internal Medicine

## 2022-06-10 ENCOUNTER — Ambulatory Visit: Payer: PPO

## 2022-06-10 DIAGNOSIS — M6281 Muscle weakness (generalized): Secondary | ICD-10-CM

## 2022-06-10 DIAGNOSIS — R269 Unspecified abnormalities of gait and mobility: Secondary | ICD-10-CM | POA: Insufficient documentation

## 2022-06-10 DIAGNOSIS — R262 Difficulty in walking, not elsewhere classified: Secondary | ICD-10-CM | POA: Insufficient documentation

## 2022-06-10 DIAGNOSIS — R278 Other lack of coordination: Secondary | ICD-10-CM | POA: Insufficient documentation

## 2022-06-10 DIAGNOSIS — I671 Cerebral aneurysm, nonruptured: Secondary | ICD-10-CM | POA: Insufficient documentation

## 2022-06-10 DIAGNOSIS — I6349 Cerebral infarction due to embolism of other cerebral artery: Secondary | ICD-10-CM | POA: Diagnosis not present

## 2022-06-10 NOTE — Therapy (Signed)
OUTPATIENT PHYSICAL THERAPY NEURO EVALUATION   Patient Name: Ashley Gross MRN: [id number] DOB:20-Nov-1950, 71 y.o., female Today's Date: 06/10/2022   PCP: Einar Pheasant, MD  REFERRING PROVIDER: Einar Pheasant, MD    PT End of Session - 06/10/22 1649     Visit Number 1    Number of Visits 17    Date for PT Re-Evaluation 08/05/22    PT Start Time 0865    PT Stop Time 1501    PT Time Calculation (min) 56 min    Equipment Utilized During Treatment Gait belt    Activity Tolerance Patient tolerated treatment well    Behavior During Therapy WFL for tasks assessed/performed             Past Medical History:  Diagnosis Date   Abnormal liver function test 10/06/2012   Anemia, unspecified    Asthma    B12 deficiency 02/11/2019   Bleeding internal hemorrhoids 09/13/2017   Choledocholithiasis 06/29/2012   Formatting of this note might be different from the original. Dilated CBD on abdominal imaging 04/2012   Chronic diarrhea 02/16/2013   Colitis    Complication of anesthesia    asthma attack after shoulder surgery   Diabetes mellitus ([place])    type II   Esophagitis    GERD (gastroesophageal reflux disease)    Heart murmur    for years- nothing to be concerned about.   History of kidney stones    Hx of arteriovenous malformation (AVM)    Hypertension    IBS (irritable bowel syndrome)    Pernicious anemia    Personal history of colonic polyps 02/10/2013   02/08/13 colonoscopy - question of rectal varices, two 3-57m polyps in the sigmoid colon, mass at the hepatic flexure, one 564mpolyp at the hepatic flexure, nodule at the ileocecal valve, congested mucusa in the entire colon, flattened villi mucosa in the terminal ileum.     Pneumonia    Psoriatic arthritis ([place]   s/p penicillamine, plaquenil, MTX, sulfasalazine.  s/p Embrel, Humira,.  Iritis.     Pure hypercholesterolemia    Rectal bleeding 07/15/2017   Past Surgical History:  Procedure Laterality Date   ABDOMINAL  HYSTERECTOMY  1981   ovaries left in place   ANKLE SURGERY     right   BUNIONECTOMY     CHOLECYSTECTOMY  1989   COLONOSCOPY WITH PROPOFOL N/A 12/27/2017   Procedure: COLONOSCOPY WITH PROPOFOL;  Surgeon: VaLin LandsmanMD;  [facility];  Service: Gastroenterology;  Laterality: N/A;   COLONOSCOPY WITH PROPOFOL N/A 01/04/2019   Procedure: COLONOSCOPY WITH PROPOFOL;  Surgeon: VaLin LandsmanMD;  [place] Service: Endoscopy;  Laterality: N/A;  Diabetic - oral and injectable   COLONOSCOPY WITH PROPOFOL N/A 07/24/2021   Procedure: COLONOSCOPY WITH PROPOFOL;  Surgeon: VaLin LandsmanMD;  [place] Service: Endoscopy;  Laterality: N/A;  Diabetic   ESOPHAGOGASTRODUODENOSCOPY (EGD) WITH PROPOFOL N/A 12/27/2017   Procedure: ESOPHAGOGASTRODUODENOSCOPY (EGD) WITH PROPOFOL;  Surgeon: VaLin LandsmanMD;  [facility];  Service: Gastroenterology;  Laterality: N/A;   HAND SURGERY     right   HEEL SPUR SURGERY     IR 3D INDEPENDENT WKST  10/01/2021   IR ANGIO INTRA EXTRACRAN SEL COM CAROTID INNOMINATE UNI L MOD SED  10/01/2021   IR ANGIO INTRA EXTRACRAN SEL INTERNAL CAROTID UNI R MOD SED  10/01/2021   IR ANGIO INTRA EXTRACRAN SEL INTERNAL CAROTID UNI R MOD SED  05/29/2022   IR ANGIO VERTEBRAL SEL VERTEBRAL BILAT MOD SED  10/01/2021   IR ANGIO VERTEBRAL SEL VERTEBRAL UNI R MOD SED  05/29/2022   IR CT HEAD LTD  10/01/2021   IR TRANSCATH/EMBOLIZ  10/01/2021   IR US GUIDE VASC ACCESS RIGHT  10/01/2021   IR US GUIDE VASC ACCESS RIGHT  05/29/2022   NOSE SURGERY     x2   POLYPECTOMY  01/04/2019   Procedure: POLYPECTOMY;  Surgeon: Lin Landsman, MD;  Location: [place];  Service: Endoscopy;;   POLYPECTOMY N/A 07/24/2021   Procedure: POLYPECTOMY;  Surgeon: Lin Landsman, MD;  Location: [place];  Service: Endoscopy;  Laterality: N/A;   RADIOLOGY WITH ANESTHESIA N/A 10/01/2021   Procedure:  Starr Sinclair WITH ANESTHESIA;  Surgeon: Pedro Earls, MD;  Location: [place];  Service: Radiology;  Laterality: N/A;   SHOULDER ARTHROSCOPY WITH OPEN ROTATOR CUFF REPAIR Left 09/20/2018   Procedure: SHOULDER ARTHROSCOPY WITH OPEN ROTATOR CUFF REPAIR;  Surgeon: Corky Mull, MD;  Location: [facility];  Service: Orthopedics;  Laterality: Left;   SHOULDER CLOSED REDUCTION Left 12/21/2018   Procedure: MANIPULATION UNDER ANESTHESIA WITH STEROID INJECTION;  Surgeon: Corky Mull, MD;  Location: [place];  Service: Orthopedics;  Laterality: Left;  Diabetic - insulin and oral meds   UMBILICAL HERNIA REPAIR     XI ROBOTIC ASSISTED INGUINAL HERNIA REPAIR WITH MESH Left 12/16/2021   Procedure: XI ROBOTIC ASSISTED INGUINAL HERNIA REPAIR WITH MESH;  Surgeon: Olean Ree, MD;  Location: [facility];  Service: General;  Laterality: Left;   Patient Active Problem List   Diagnosis Date Noted   Stroke ([place]) 05/30/2022   Iron deficiency anemia 04/18/2022   Anemia 01/25/2022   Left inguinal hernia    Pre-op evaluation 11/26/2021   Meningioma ([place]) 11/05/2021   Inguinal hernia 10/31/2021   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[place]) 10/31/2021   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   Loss of weight    Ear fullness 05/25/2021   Skin lesion 03/15/2021   Rib pain on left side 08/05/2020   Sleep difficulties 06/08/2020   Swelling of lower extremity 11/26/2019   Healthcare maintenance 09/13/2019   Asthma 12/28/2018   Vitamin D deficiency 11/01/2018   Tendinitis of upper biceps tendon of left shoulder 09/20/2018   Depression, major, single episode, mild ([place]) 09/15/2018   Mild intermittent asthma without complication 15/03/6978   Controlled type 2 diabetes mellitus with complication, with long-term current use of insulin ([place]) 09/15/2018   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   Traumatic incomplete tear of left rotator cuff 09/09/2018    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 Admitted to hospital on 05/30/22.  REFERRING DIAG: I63.49 (ICD-10-CM) - Cerebrovascular accident (CVA) due to embolism of other cerebral artery (HCC) I67.1 (ICD-10-CM) - Brain aneurysm   THERAPY DIAG:  Abnormality of gait and mobility  Difficulty in walking, not elsewhere classified  Muscle weakness (generalized)  Rationale for Evaluation and Treatment Rehabilitation  SUBJECTIVE:  SUBJECTIVE STATEMENT: Pt presents to OPPT for acute CVA after cerebral angiogram. Pt reporting L sided weakness due to R pontine infarct. Independent at baseline and is back to work as in last week. 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 Currently relies on RW since CVA mainly in community. Pt denies falls since CVA. Ambulates without AD in household and office at church. Pt says weakness has improved but still has instances of dragging LLE or buckling of knee if she has been doing a lot of standing or walking. Does report poor eccentric control descending stairs with LLE. Reports she has been living with her grand daughter and plans to move into her older daughter's house although has been independent currently with all ADL's in household and is clear to drive from [place].   Pt accompanied by: self  PERTINENT HISTORY:  Pt presents to OPPT for acute CVA after cerebral angiogram. Pt reporting L sided weakness due to R pontine infarct. Independent at baseline and is back  to work as in last week. 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 Currently relies on RW since CVA mainly in community. Pt denies falls since CVA. Ambulates without AD in household and office at church. Pt says weakness has improved but still has instances of dragging LLE or buckling of knee if she has been doing a lot of standing or walking. Does report poor eccentric control descending stairs with LLE. Reports she has been living with her grand daughter and plans to move into her older daughter's house although has been independent currently with all ADL's in household and is clear to drive from [place].      Per MD in acute care: Ms. AKARI DEFELICE is a 71 y.o. female with history of DM2, colitis, proriatic arthritis, asthma, HTN, anemia, left inguinal hernia s/p repair 12/16/21, ACOM aneurysm embolization in 2022 and arteriovenous malformation. Developed aphasia and weakness after cerebral angiogram on Friday. MRI with evidence of acute right pontine infarction.   PAIN:  Are you having pain? No  PRECAUTIONS: None  WEIGHT BEARING RESTRICTIONS No  FALLS: Has patient fallen in last 6 months? No  LIVING ENVIRONMENT: Lives with: lives with their family Lives in: House/apartment Stairs: Yes: External: 4 in front porch, 3 from garage steps; on right going up and can reach both Has following equipment at home: Walker - 2 wheeled  PLOF: Independent  PATIENT GOALS quit relying on RW, return to full function  OBJECTIVE:   DIAGNOSTIC FINDINGS: Stroke:  acute right paramedian pontine infarction with additional punctate foci of infarction in the right cerebellum and right > left occipital lobes. Etiology is procedural complication from recent cerebral angiogram procedure on 7/28.  MRI  Acute nonhemorrhagic 2 cm right paramedian pontine infarct, punctate foci of infarction in the right cerebellum, and right > left occipital lobes  COGNITION: Overall cognitive status: Within functional limits for tasks  assessed   SENSATION: WFL  COORDINATION: Normal heel to shin and RAMP of LE's  PROPRIOCEPTION:  Normal in BLE's   POSTURE: No Significant postural limitations  LOWER EXTREMITY ROM:     Active  Right Eval Left Eval  Hip flexion    Hip extension    Hip abduction    Hip adduction    Hip internal rotation    Hip external rotation    Knee flexion    Knee extension    Ankle dorsiflexion    Ankle plantarflexion    Ankle inversion    Ankle eversion     (  Blank rows = not tested)  LOWER EXTREMITY MMT:    MMT Right Eval Left Eval  Hip flexion    Hip extension    Hip abduction 5/5 5/5  Hip adduction 5/5 5/5  Hip internal rotation    Hip external rotation    Knee flexion    Knee extension 5/5 4/5  Ankle dorsiflexion 5/5 4/5  Ankle plantarflexion 5/5 5/5  Ankle inversion    Ankle eversion    (Blank rows = not tested)  TRANSFERS: Assistive device utilized: None  Sit to stand: Modified independence Stand to sit: Complete Independence    STAIRS:  Level of Assistance: Complete Independence  Stair Negotiation Technique: Step to Pattern Alternating Pattern  Forwards with Bilateral Rails  Number of Stairs: 4   Height of Stairs: 6"  Comments: alternating pattern ascending, step to pattern descending  GAIT: Gait pattern: step through pattern, decreased arm swing- Right, decreased arm swing- Left, decreased step length- Right, decreased step length- Left, decreased stance time- Left, decreased hip/knee flexion- Left, and lateral lean- Left Distance walked: 10 meters Assistive device utilized: None Level of assistance: CGA Comments: UE's initially up in high guard position with limited stance time, step lengths, and toe off on LLE.  FUNCTIONAL TESTs:  5 times sit to stand: 10.3 sec 6 minute walk test: 1,030' 10 meter walk test: with RW: .95 m/s    ;without AD: .93 m/s Functional gait assessment: 20/30. Cut off is 22/30 indicative pt is at moderate risk for  falls.  PATIENT SURVEYS:  FOTO 53 with target score of 64  TODAY'S TREATMENT:  SLS on LLE and single UE support. 2-3x/day 2-3 reps of 30-45 seconds   PATIENT EDUCATION: Education details: POC, prognosis, HEP Person educated: Patient Education method: Customer service manager Education comprehension: verbalized understanding and returned demonstration   HOME EXERCISE PROGRAM: SLS on LLE and single UE support. 2-3x/day 2-3 reps of 30-45 seconds    GOALS: Goals reviewed with patient? No  SHORT TERM GOALS: Target date: 07/08/2022  Pt will be independent with HEP to improve balance, strength, and gait to return to independence with gait and ADL's Baseline: Introduced Goal status: INITIAL  LONG TERM GOALS: Target date: 08/05/2022  Pt will improve FOTO to target score to demonstrate clinically significant improvement in perceived functional mobility. Baseline: 53 with target score of 64 Goal status: INITIAL  2.  Pt will improve FGA by 4 points or greater to demonstrate reduced risk of falls with community ambulation tasks.  Baseline: 20/30 Goal status: INITIAL  3.  Pt will improve 6MWT by 164' to demonstrate clinically significant improvement in tolerance for community walking tasks at reduced risk for falls.  Baseline: 1,030' no AD Goal status: INITIAL  4.  Pt will improve 10 m gait speed without AD to >/= 1.2 m/s to demonstrate being a safe Hydrographic surveyor.  Baseline: .93 m/s without AD Goal status: INITIAL  ASSESSMENT:  CLINICAL IMPRESSION: Patient is a 71 y.o.female who was seen today for physical therapy evaluation and treatment for balance, LE weakness, and gait deficits s/p CVA. Pt presents with deficits in LLE weakness, decreased dynamic balance for a community ambulator, and decreased gait speed placing pt at increased risk of falls. Pt reliant on RW for community distances and taks which pt did not rely on prior to CVA. Pt will benefit from skilled PT  services to address these deficits to return to full independence with job tasks and community ambulation to reduce risk of falls and  return to PLOF.   OBJECTIVE IMPAIRMENTS Abnormal gait, decreased balance, difficulty walking, and decreased strength.   ACTIVITY LIMITATIONS stairs and locomotion level  PARTICIPATION LIMITATIONS: community activity and occupation  PERSONAL FACTORS Age, Fitness, Past/current experiences, Time since onset of injury/illness/exacerbation, and 3+ comorbidities: Anemia, GERD, heart murmur, IBS, HTN  are also affecting patient's functional outcome.   REHAB POTENTIAL: Excellent  CLINICAL DECISION MAKING: Evolving/moderate complexity  EVALUATION COMPLEXITY: Moderate  PLAN: PT FREQUENCY: 1-2x/week  PT DURATION: 8 weeks  PLANNED INTERVENTIONS: Therapeutic exercises, Therapeutic activity, Neuromuscular re-education, Balance training, Gait training, Patient/Family education, Self Care, Stair training, and DME instructions  PLAN FOR NEXT SESSION: establish and print HEP. Address LLE ankle DF and quad weakness. Dynamic balance tasks   Salem Caster. Fairly IV, PT, DPT Physical Therapist- [facility]  06/10/2022, 4:51 PM

## 2022-06-10 NOTE — Therapy (Addendum)
OUTPATIENT OCCUPATIONAL THERAPY NEURO EVALUATION  Patient Name: Ashley Gross MRN: [id number] DOB:[birth year], 71 y.o., female Today's Date: 06/10/2022  PCP: Dr. Einar Pheasant REFERRING PROVIDER: Dr. Einar Pheasant   OT End of Session - 06/10/22 1415     Visit Number 1    Number of Visits 1    Date for OT Re-Evaluation --    OT Start Time 1502    OT Stop Time 1540    OT Time Calculation (min) 38 min    Activity Tolerance Patient tolerated treatment well    Behavior During Therapy [facility] for tasks assessed/performed             Past Medical History:  Diagnosis Date   Abnormal liver function test 10/06/2012   Anemia, unspecified    Asthma    B12 deficiency 02/11/2019   Bleeding internal hemorrhoids 09/13/2017   Choledocholithiasis 06/29/2012   Formatting of this note might be different from the original. Dilated CBD on abdominal imaging 04/2012   Chronic diarrhea 02/16/2013   Colitis    Complication of anesthesia    asthma attack after shoulder surgery   Diabetes mellitus ([place])    type II   Esophagitis    GERD (gastroesophageal reflux disease)    Heart murmur    for years- nothing to be concerned about.   History of kidney stones    Hx of arteriovenous malformation (AVM)    Hypertension    IBS (irritable bowel syndrome)    Pernicious anemia    Personal history of colonic polyps 02/10/2013   02/08/13 colonoscopy - question of rectal varices, two 3-60m polyps in the sigmoid colon, mass at the hepatic flexure, one 558mpolyp at the hepatic flexure, nodule at the ileocecal valve, congested mucusa in the entire colon, flattened villi mucosa in the terminal ileum.     Pneumonia    Psoriatic arthritis ([place]   s/p penicillamine, plaquenil, MTX, sulfasalazine.  s/p Embrel, Humira,.  Iritis.     Pure hypercholesterolemia    Rectal bleeding 07/15/2017   Past Surgical History:  Procedure Laterality Date   ABDOMINAL HYSTERECTOMY  1981   ovaries left in place   ANKLE SURGERY      right   BUNIONECTOMY     CHOLECYSTECTOMY  1989   COLONOSCOPY WITH PROPOFOL N/A 12/27/2017   Procedure: COLONOSCOPY WITH PROPOFOL;  Surgeon: VaLin LandsmanMD;  [facility];  Service: Gastroenterology;  Laterality: N/A;   COLONOSCOPY WITH PROPOFOL N/A 01/04/2019   Procedure: COLONOSCOPY WITH PROPOFOL;  Surgeon: VaLin LandsmanMD;  [place] Service: End[place];  Laterality: N/A;  Diabetic - oral and injectable   COLONOSCOPY WITH PROPOFOL N/A 07/24/2021   Procedure: COLONOSCOPY WITH PROPOFOL;  Surgeon: VaLin LandsmanMD;  [place] Service: Endoscopy;  Laterality: N/A;  Diabetic   ESOPHAGOGASTRODUODENOSCOPY (EGD) WITH PROPOFOL N/A 12/27/2017   Procedure: ESOPHAGOGASTRODUODENOSCOPY (EGD) WITH PROPOFOL;  Surgeon: VaLin LandsmanMD;  [facility];  Service: Gastroenterology;  Laterality: N/A;   HAND SURGERY     right   HEEL SPUR SURGERY     IR 3D INDEPENDENT WKST  10/01/2021   IR ANGIO INTRA EXTRACRAN SEL COM CAROTID INNOMINATE UNI L MOD SED  10/01/2021   IR ANGIO INTRA EXTRACRAN SEL INTERNAL CAROTID UNI R MOD SED  10/01/2021   IR ANGIO INTRA EXTRACRAN SEL INTERNAL CAROTID UNI R MOD SED  05/29/2022   IR ANGIO VERTEBRAL SEL VERTEBRAL BILAT MOD SED  10/01/2021  IR ANGIO VERTEBRAL SEL VERTEBRAL UNI R MOD SED  05/29/2022   IR CT HEAD LTD  10/01/2021   IR TRANSCATH/EMBOLIZ  10/01/2021   IR US GUIDE VASC ACCESS RIGHT  10/01/2021   IR US GUIDE VASC ACCESS RIGHT  05/29/2022   NOSE SURGERY     x2   POLYPECTOMY  01/04/2019   Procedure: POLYPECTOMY;  Surgeon: Lin Landsman, MD;  Location: [place];  Service: Endoscopy;;   POLYPECTOMY N/A 07/24/2021   Procedure: POLYPECTOMY;  Surgeon: Lin Landsman, MD;  Location: [place];  Service: Endoscopy;  Laterality: N/A;   RADIOLOGY WITH ANESTHESIA N/A 10/01/2021   Procedure: Starr Sinclair WITH ANESTHESIA;  Surgeon: Pedro Earls, MD;  Location: [place];  Service: Radiology;  Laterality: N/A;   SHOULDER ARTHROSCOPY WITH OPEN ROTATOR CUFF REPAIR Left 09/20/2018   Procedure: SHOULDER ARTHROSCOPY WITH OPEN ROTATOR CUFF REPAIR;  Surgeon: Corky Mull, MD;  Location: [facility];  Service: Orthopedics;  Laterality: Left;   SHOULDER CLOSED REDUCTION Left 12/21/2018   Procedure: MANIPULATION UNDER ANESTHESIA WITH STEROID INJECTION;  Surgeon: Corky Mull, MD;  Location: [place];  Service: Orthopedics;  Laterality: Left;  Diabetic - insulin and oral meds   UMBILICAL HERNIA REPAIR     XI ROBOTIC ASSISTED INGUINAL HERNIA REPAIR WITH MESH Left 12/16/2021   Procedure: XI ROBOTIC ASSISTED INGUINAL HERNIA REPAIR WITH MESH;  Surgeon: Olean Ree, MD;  Location: [facility];  Service: General;  Laterality: Left;   Patient Active Problem List   Diagnosis Date Noted   Stroke ([place]) 05/30/2022   Iron deficiency anemia 04/18/2022   Anemia 01/25/2022   Left inguinal hernia    Pre-op evaluation 11/26/2021   Meningioma ([place]) 11/05/2021   Inguinal hernia 10/31/2021   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[place]) 10/31/2021   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   Loss of weight    Ear fullness 05/25/2021   Skin lesion 03/15/2021   Rib pain on left side 08/05/2020   Sleep difficulties 06/08/2020   Swelling of lower extremity 11/26/2019   Healthcare maintenance 09/13/2019   Asthma 12/28/2018   Vitamin D deficiency 11/01/2018   Tendinitis of upper biceps tendon of left shoulder 09/20/2018   Depression, major, single episode, mild ([place]) 09/15/2018   Mild intermittent asthma without complication 49/17/9150   Controlled type 2 diabetes mellitus with complication, with long-term current use of insulin ([place]) 09/15/2018   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   Traumatic incomplete tear of left rotator cuff 09/09/2018   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   Low back pain 03/29/2016   Stress  12/12/2014   Inflammatory polyps of colon ([place]) 02/16/2013   Personal history of colonic polyps 02/10/2013   Hypertension 10/06/2012   GERD (gastroesophageal reflux disease) 10/06/2012   Hypercholesterolemia 10/06/2012   Psoriatic arthritis ([place]) 06/29/2012    ONSET DATE: 05/29/22  REFERRING DIAG: CVA  THERAPY DIAG:  Muscle weakness (generalized) - Plan: Ot plan of care cert/re-cert  Other lack of coordination - Plan: Ot plan of care cert/re-cert  Rationale for Evaluation and Treatment Rehabilitation  SUBJECTIVE: Pt reports she went back to work last [employer] and work has been going great.     Pt accompanied by: self  PERTINENT HISTORY: Per chart, pt a 71 y.o. 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    Pt underwent routine diagnostic cerebral angiogram of previous embolized anterior communicating artery on 7/28.  Shortly after the procedure she had brief double vision, ongoing home she had difficulty with her gait and was stumbling when she was previously walking independently.  Also was having slurred speech.  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 Also additional punctate foci of acute infarct in the right cerebellum and right greater than left occipital lobes.     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 No  FALLS: Has patient fallen in last 6 months? No  LIVING ENVIRONMENT: Lives with: Granddaughter has  been staying with pt since the stroke, but will be leaving for college soon.  Granddaughter has stayed with pt intermittently since spouse passed away 18 months ago. Lives in: condo Stairs: 3 steps coming in from the garage, 4 steps at the front door, once in the house all 1 level Has following equipment at home: Gilford Rile - 2 wheeled  PLOF: Independent; pt works part time as a Education officer, community at [employer].  PATIENT GOALS : Return to PLOF and not have to use the walker anymore.  OBJECTIVE:   HAND DOMINANCE: Right  ADLs: Overall ADLs: baseline/indep Transfers/ambulation related to ADLs: using FWW only outside the home, but is no longer using it at work.  Eating: able to cut food Grooming: able to engage LUE into curling and blow drying hair.  UB Dressing: able to manage all clothing fasteners LB Dressing: indep Toileting: indep Bathing: indep Tub Shower transfers: walk in shower/indep Equipment: has built in shower seat but doesn't have to use it.   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, more severe in the L hand  Active ROM Right eval Left eval  Shoulder flexion WNL ~75% of full flex (hx of L shoulder fx in 2021  Shoulder abduction    Shoulder adduction    Shoulder extension    Shoulder internal rotation    Shoulder external rotation    Elbow flexion    Elbow extension    Wrist flexion    Wrist extension    Wrist ulnar deviation    Wrist radial deviation    Wrist pronation    Wrist  supination    (Blank rows = not tested)   UPPER EXTREMITY MMT:     MMT Right eval Left eval  Shoulder flexion 5/5 4/5 (hx of L shoulder fx  Shoulder abduction 5/5 4/5  Shoulder adduction    Shoulder extension    Shoulder internal rotation    Shoulder external rotation    Middle trapezius    Lower trapezius    Elbow flexion 5/5 5/5  Elbow extension 5/5 5/5  Wrist flexion 5/5 5/5  Wrist extension 5/5 5/5  Wrist ulnar deviation    Wrist radial deviation  Wrist pronation    Wrist supination    (Blank rows = not tested)  HAND FUNCTION: Grip strength: Right: 34 lbs; Left: 30 lbs, Lateral pinch: Right: 9 lbs, Left: 9 lbs, and 3 point pinch: Right: 8 lbs, Left: 6 lbs  COORDINATION: 9 Hole Peg test: Right: 21 sec; Left: 33 sec; swan neck deformities bilat hands, L hand more affected than R.    SENSATION: WFL  EDEMA: none  MUSCLE TONE: normal throughout LUE  COGNITION: Overall cognitive status: Within functional limits for tasks assessed  VISION: Subjective report: Had some diplopia initially after sedation from her procedure, but that cleared and pt reports no visual changes from the strokes. Baseline vision: Wears glasses all the time Visual history: cataracts removed both eyes  VISION ASSESSMENT: WFL  PRAXIS: WFL  OBSERVATIONS:  Decreased FM on the L related to swan neck deformities which are more prominent in the L hand vs R   TODAY'S TREATMENT:  Evaluation completed, objective measures taken.    PATIENT EDUCATION: Education details: Role of OT/goals Person educated: Patient Education method: Explanation and Verbal cues Education comprehension: verbalized understanding   HOME EXERCISE PROGRAM: N/A; continue to engage L hand into all ADL/IADL and work tasks.    GOALS: Goals reviewed with patient? Yes  SHORT TERM GOALS: Target date: 06/10/22  Pt will be indep-modified indep with all ADLs/IADLs/work tasks. Baseline: indep-modified indep Goal status:  MET  ASSESSMENT:  CLINICAL IMPRESSION: Patient is a 71 y.o. female who was seen today for occupational therapy evaluation for assessment related to functional decline post CVA.  No additional skilled OT indicated at this time as pt is performing all ADL/IADL/work tasks independently.  Pt reports that she has returned to full work duties, including typing.  Any coordination impairment noted in eval more so related to severity of swan neck deformities in bilat hands (L worse than R).  Pt states she tried ring splints years ago but she felt she was more functional without them.  No additional skilled OT indicated at this time.  Pt in agreement with plan.   PERFORMANCE DEFICITS in functional skills including dexterity (related to RA deformities rather than affects of CVA) IMPAIRMENTS are limiting patient from work. (Using RW)  COMORBIDITIES may have co-morbidities  that affects occupational performance. Patient will benefit from skilled OT to address above impairments and improve overall function.  MODIFICATION OR ASSISTANCE TO COMPLETE EVALUATION: No modification of tasks or assist necessary to complete an evaluation.  OT OCCUPATIONAL PROFILE AND HISTORY: Problem focused assessment: Including review of records relating to presenting problem.  CLINICAL DECISION MAKING: LOW - limited treatment options, no task modification necessary  REHAB POTENTIAL: Good  EVALUATION COMPLEXITY: Low    PLAN: OT FREQUENCY: one time visit  PLANNED INTERVENTIONS: N/A, eval only   RECOMMENDED OTHER SERVICES: Pt will be working with PT to address gait instability post CVA  CONSULTED AND AGREED WITH PLAN OF CARE: Patient  PLAN FOR NEXT SESSION: N/A; eval only   Leta Speller, MS, OTR/L  Darleene Cleaver, OT 06/10/2022, 5:00 PM

## 2022-06-12 ENCOUNTER — Other Ambulatory Visit: Payer: Self-pay

## 2022-06-12 ENCOUNTER — Ambulatory Visit (INDEPENDENT_AMBULATORY_CARE_PROVIDER_SITE_OTHER): Payer: PPO | Admitting: Internal Medicine

## 2022-06-12 ENCOUNTER — Encounter: Payer: Self-pay | Admitting: Internal Medicine

## 2022-06-12 ENCOUNTER — Inpatient Hospital Stay: Payer: PPO | Attending: Oncology

## 2022-06-12 ENCOUNTER — Telehealth: Payer: Self-pay | Admitting: *Deleted

## 2022-06-12 ENCOUNTER — Other Ambulatory Visit: Payer: Self-pay | Admitting: *Deleted

## 2022-06-12 VITALS — BP 128/62 | HR 63 | Temp 97.8°F | Resp 15 | Ht 64.0 in | Wt 116.0 lb

## 2022-06-12 DIAGNOSIS — E538 Deficiency of other specified B group vitamins: Secondary | ICD-10-CM | POA: Insufficient documentation

## 2022-06-12 DIAGNOSIS — D500 Iron deficiency anemia secondary to blood loss (chronic): Secondary | ICD-10-CM | POA: Diagnosis not present

## 2022-06-12 DIAGNOSIS — F320 Major depressive disorder, single episode, mild: Secondary | ICD-10-CM

## 2022-06-12 DIAGNOSIS — D509 Iron deficiency anemia, unspecified: Secondary | ICD-10-CM | POA: Diagnosis not present

## 2022-06-12 DIAGNOSIS — Z794 Long term (current) use of insulin: Secondary | ICD-10-CM

## 2022-06-12 DIAGNOSIS — E118 Type 2 diabetes mellitus with unspecified complications: Secondary | ICD-10-CM

## 2022-06-12 DIAGNOSIS — E7800 Pure hypercholesterolemia, unspecified: Secondary | ICD-10-CM | POA: Diagnosis not present

## 2022-06-12 DIAGNOSIS — E611 Iron deficiency: Secondary | ICD-10-CM

## 2022-06-12 DIAGNOSIS — J4520 Mild intermittent asthma, uncomplicated: Secondary | ICD-10-CM

## 2022-06-12 DIAGNOSIS — E237 Disorder of pituitary gland, unspecified: Secondary | ICD-10-CM

## 2022-06-12 DIAGNOSIS — L4050 Arthropathic psoriasis, unspecified: Secondary | ICD-10-CM

## 2022-06-12 DIAGNOSIS — Z8673 Personal history of transient ischemic attack (TIA), and cerebral infarction without residual deficits: Secondary | ICD-10-CM | POA: Diagnosis not present

## 2022-06-12 DIAGNOSIS — K219 Gastro-esophageal reflux disease without esophagitis: Secondary | ICD-10-CM | POA: Diagnosis not present

## 2022-06-12 DIAGNOSIS — I10 Essential (primary) hypertension: Secondary | ICD-10-CM | POA: Diagnosis not present

## 2022-06-12 DIAGNOSIS — D329 Benign neoplasm of meninges, unspecified: Secondary | ICD-10-CM | POA: Diagnosis not present

## 2022-06-12 DIAGNOSIS — I671 Cerebral aneurysm, nonruptured: Secondary | ICD-10-CM

## 2022-06-12 LAB — FERRITIN: Ferritin: 101 ng/mL (ref 11–307)

## 2022-06-12 LAB — COMPREHENSIVE METABOLIC PANEL
ALT: 27 U/L (ref 0–44)
AST: 24 U/L (ref 15–41)
Albumin: 3.1 g/dL — ABNORMAL LOW (ref 3.5–5.0)
Alkaline Phosphatase: 69 U/L (ref 38–126)
Anion gap: 9 (ref 5–15)
BUN: 16 mg/dL (ref 8–23)
CO2: 27 mmol/L (ref 22–32)
Calcium: 8 mg/dL — ABNORMAL LOW (ref 8.9–10.3)
Chloride: 102 mmol/L (ref 98–111)
Creatinine, Ser: 0.74 mg/dL (ref 0.44–1.00)
GFR, Estimated: >60 mL/min (ref >60)
Glucose, Bld: 188 mg/dL — ABNORMAL HIGH (ref 70–99)
Potassium: 3.8 mmol/L (ref 3.5–5.1)
Sodium: 138 mmol/L (ref 135–145)
Total Bilirubin: 0.2 mg/dL — ABNORMAL LOW (ref 0.3–1.2)
Total Protein: 5.5 g/dL — ABNORMAL LOW (ref 6.5–8.1)

## 2022-06-12 LAB — CBC WITH DIFFERENTIAL/PLATELET
Abs Immature Granulocytes: 0.02 10*3/uL (ref 0.00–0.07)
Basophils Absolute: 0 10*3/uL (ref 0.0–0.1)
Basophils Relative: 1 %
Eosinophils Absolute: 0.1 10*3/uL (ref 0.0–0.5)
Eosinophils Relative: 2 %
HCT: 34.5 % — ABNORMAL LOW (ref 36.0–46.0)
Hemoglobin: 10.9 g/dL — ABNORMAL LOW (ref 12.0–15.0)
Immature Granulocytes: 0 %
Lymphocytes Relative: 24 %
Lymphs Abs: 1.3 10*3/uL (ref 0.7–4.0)
MCH: 29.2 pg (ref 26.0–34.0)
MCHC: 31.6 g/dL (ref 30.0–36.0)
MCV: 92.5 fL (ref 80.0–100.0)
Monocytes Absolute: 0.3 10*3/uL (ref 0.1–1.0)
Monocytes Relative: 6 %
Neutro Abs: 3.6 10*3/uL (ref 1.7–7.7)
Neutrophils Relative %: 67 %
Platelets: 228 10*3/uL (ref 150–400)
RBC: 3.73 MIL/uL — ABNORMAL LOW (ref 3.87–5.11)
RDW: 20.7 % — ABNORMAL HIGH (ref 11.5–15.5)
WBC: 5.3 10*3/uL (ref 4.0–10.5)
nRBC: 0 % (ref 0.0–0.2)

## 2022-06-12 LAB — IRON AND TIBC
Iron: 74 ug/dL (ref 28–170)
Saturation Ratios: 25 % (ref 10.4–31.8)
TIBC: 295 ug/dL (ref 250–450)
UIBC: 221 ug/dL

## 2022-06-12 LAB — VITAMIN B12: Vitamin B-12: 171 pg/mL — ABNORMAL LOW (ref 180–914)

## 2022-06-12 LAB — FOLATE: Folate: 12.1 ng/mL (ref >5.9)

## 2022-06-12 LAB — TSH: TSH: 2.471 u[IU]/mL (ref 0.350–4.500)

## 2022-06-12 NOTE — Telephone Encounter (Signed)
Pt came in and and was telling staff that Dr. Nicki Reaper wants the pt to have met c and she is here to get IDA labs. I spoke to [facility] and he was agreeable and I add the met c and will send it to dr scott once the numbers come back

## 2022-06-12 NOTE — Progress Notes (Signed)
Patient ID: Ashley Gross, female   DOB: Sep 06, 1951, 71 y.o.   MRN: [id number]   Subjective:    Patient ID: Ashley Gross, female    DOB: 1951-03-24, 71 y.o.   MRN: [id number]   Patient here for hospital follow up.   Chief Complaint  Patient presents with   Hospitalization Follow-up   .   HPI Admitted 05/30/22 - 06/01/22.  She underwent routine diagnostic cerebral angiogram of previous embolized anterior communicating artery on 7/28.  Shortly after the procedure she had brief double vision, ongoing home she had difficulty with her gait and was stumbling when she was previously walking independently.  Also was having slurred speech.  Presented to [facility] with above. 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 Also additional punctate foci of acute infarct in the right cerebellum and right greater than left occipital lobes. MRA head and neck were negative for acute findings. was admitted for further w/up.  ECHO - no evidence of embolic source. With history of significant GI bleed in the past due to AVM, polyp bleed, still requiring intermittent IV transfusion, so at this point she was not started on antiplatelet therapy. Discharged with planned OT/PT.  She is talking and walking better.  PT planning to work with her over the next 6-8 weeks.  Some increased stress.  She will be moving in with her oldest daughter.  Discussed.  Overall appears to be handling things relatively well.  Not as much diarrhea.  Eating.  No nausea or vomiting.  Scheduled to have labs through Dr Janese Banks.  Request to not have labs drawn here.     Past Medical History:  Diagnosis Date   Abnormal liver function test 10/06/2012   Anemia, unspecified    Asthma    B12 deficiency 02/11/2019   Bleeding internal hemorrhoids 09/13/2017   Choledocholithiasis 06/29/2012   Formatting of this note might be different from the original. Dilated CBD on abdominal imaging 04/2012   Chronic diarrhea  02/16/2013   Colitis    Complication of anesthesia    asthma attack after shoulder surgery   Diabetes mellitus ([place])    type II   Esophagitis    GERD (gastroesophageal reflux disease)    Heart murmur    for years- nothing to be concerned about.   History of kidney stones    Hx of arteriovenous malformation (AVM)    Hypertension    IBS (irritable bowel syndrome)    Pernicious anemia    Personal history of colonic polyps 02/10/2013   02/08/13 colonoscopy - question of rectal varices, two 3-9m polyps in the sigmoid colon, mass at the hepatic flexure, one 524mpolyp at the hepatic flexure, nodule at the ileocecal valve, congested mucusa in the entire colon, flattened villi mucosa in the terminal ileum.     Pneumonia    Psoriatic [place] ([place]   s/p penicillamine, plaquenil, MTX, sulfasalazine.  s/p Embrel, Humira,.  Iritis.     Pure hypercholesterolemia    Rectal bleeding 07/15/2017   [place] [place]   per patient   Past Surgical History:  Procedure Laterality Date   ABDOMINAL HYSTERECTOMY  1981   ovaries left in place   ANKLE SURGERY     right   BUNIONECTOMY     CHOLECYSTECTOMY  1989   COLONOSCOPY WITH PROPOFOL N/A 12/27/2017   Procedure: COLONOSCOPY WITH PROPOFOL;  Surgeon: VaLin LandsmanMD;  [facility];  Service: Gastroenterology;  Laterality: N/A;   COLONOSCOPY  WITH PROPOFOL N/A 01/04/2019   Procedure: COLONOSCOPY WITH PROPOFOL;  Surgeon: Lin Landsman, MD;  Location: [place];  Service: Endoscopy;  Laterality: N/A;  Diabetic - oral and injectable   COLONOSCOPY WITH PROPOFOL N/A 07/24/2021   Procedure: COLONOSCOPY WITH PROPOFOL;  Surgeon: Lin Landsman, MD;  Location: [place];  Service: Endoscopy;  Laterality: N/A;  Diabetic   ESOPHAGOGASTRODUODENOSCOPY (EGD) WITH PROPOFOL N/A 12/27/2017   Procedure: ESOPHAGOGASTRODUODENOSCOPY (EGD) WITH PROPOFOL;  Surgeon: Lin Landsman, MD;  [facility];  Service:  Gastroenterology;  Laterality: N/A;   HAND SURGERY     right   HEEL SPUR SURGERY     IR 3D INDEPENDENT WKST  10/01/2021   IR ANGIO INTRA EXTRACRAN SEL COM CAROTID INNOMINATE UNI L MOD SED  10/01/2021   IR ANGIO INTRA EXTRACRAN SEL INTERNAL CAROTID UNI R MOD SED  10/01/2021   IR ANGIO INTRA EXTRACRAN SEL INTERNAL CAROTID UNI R MOD SED  05/29/2022   IR ANGIO VERTEBRAL SEL VERTEBRAL BILAT MOD SED  10/01/2021   IR ANGIO VERTEBRAL SEL VERTEBRAL UNI R MOD SED  05/29/2022   IR CT HEAD LTD  10/01/2021   IR TRANSCATH/EMBOLIZ  10/01/2021   IR US GUIDE VASC ACCESS RIGHT  10/01/2021   IR US GUIDE VASC ACCESS RIGHT  05/29/2022   NOSE SURGERY     x2   POLYPECTOMY  01/04/2019   Procedure: POLYPECTOMY;  Surgeon: Lin Landsman, MD;  Location: [place];  Service: Endoscopy;;   POLYPECTOMY N/A 07/24/2021   Procedure: POLYPECTOMY;  Surgeon: Lin Landsman, MD;  Location: [place];  Service: Endoscopy;  Laterality: N/A;   RADIOLOGY WITH ANESTHESIA N/A 10/01/2021   Procedure: Starr Sinclair WITH ANESTHESIA;  Surgeon: Pedro Earls, MD;  Location: [place];  Service: Radiology;  Laterality: N/A;   SHOULDER ARTHROSCOPY WITH OPEN ROTATOR CUFF REPAIR Left 09/20/2018   Procedure: SHOULDER ARTHROSCOPY WITH OPEN ROTATOR CUFF REPAIR;  Surgeon: Corky Mull, MD;  Location: [facility];  Service: Orthopedics;  Laterality: Left;   SHOULDER CLOSED REDUCTION Left 12/21/2018   Procedure: MANIPULATION UNDER ANESTHESIA WITH STEROID INJECTION;  Surgeon: Corky Mull, MD;  Location: [place];  Service: Orthopedics;  Laterality: Left;  Diabetic - insulin and oral meds   UMBILICAL HERNIA REPAIR     XI ROBOTIC ASSISTED INGUINAL HERNIA REPAIR WITH MESH Left 12/16/2021   Procedure: XI ROBOTIC ASSISTED INGUINAL HERNIA REPAIR WITH MESH;  Surgeon: Olean Ree, MD;  Location: [facility];  Service: General;  Laterality: Left;   Family History  Problem Relation Age of Onset   Diabetes  Other    Hypertension Other    Breast cancer Neg Hx    Colon cancer Neg Hx    Social History   Socioeconomic History   Marital status: Widowed    Spouse name: Not on file   Number of children: 2   Years of education: Not on file   Highest education level: Not on file  Occupational History   Not on file  Tobacco Use   Smoking status: Never   Smokeless tobacco: Never  Vaping Use   Vaping Use: Never used  Substance and Sexual Activity   Alcohol use: Never   Drug use: Never   Sexual activity: Not Currently  Other Topics Concern   Not on file  Social History Narrative   She is married and has two children   [place] daughter lives with her.   Social Determinants of Health  Financial Resource Strain: Low Risk  (05/13/2022)   Overall Financial Resource Strain (CARDIA)    Difficulty of Paying Living Expenses: Not hard at all  Food Insecurity: No Food Insecurity (05/13/2022)   Hunger Vital Sign    Worried About Running Out of Food in the Last Year: Never true    Ran Out of Food in the Last Year: Never true  Transportation Needs: No Transportation Needs (05/13/2022)   PRAPARE - Hydrologist (Medical): No    Lack of Transportation (Non-Medical): No  Physical Activity: Not on file  Stress: No Stress Concern Present (05/13/2022)   [place]    Feeling of Stress : Not at all  Social Connections: Unknown (01/07/2021)   Social Connection and Isolation Panel [NHANES]    Frequency of Communication with Friends and Family: More than three times a week    Frequency of Social Gatherings with Friends and Family: Not on file    Attends Religious Services: Not on file    Active Member of Clubs or Organizations: Not on file    Attends Archivist Meetings: Not on file    Marital Status: Not on file     Review of Systems  Constitutional:  Negative for appetite change and unexpected weight  change.  HENT:  Negative for congestion and sinus pressure.   Respiratory:  Negative for cough, chest tightness and shortness of breath.   Cardiovascular:  Negative for chest pain, palpitations and leg swelling.  Gastrointestinal:  Negative for abdominal pain, nausea and vomiting.       Still with loose stool - reports not as bad.    Genitourinary:  Negative for difficulty urinating and dysuria.  Musculoskeletal:  Negative for joint swelling and myalgias.  Skin:  Negative for color change and rash.  Neurological:  Negative for dizziness and headaches.  Psychiatric/Behavioral:  Negative for agitation and dysphoric mood.        Objective:     BP 128/62 (BP Location: Left Arm, Patient Position: Sitting, Cuff Size: Small)   Pulse 63   Temp 97.8 F (36.6 C) (Temporal)   Resp 15   Ht 5' 4" (1.626 m)   Wt 116 lb (52.6 kg)   SpO2 98%   BMI 19.91 kg/m  Wt Readings from Last 3 Encounters:  06/12/22 116 lb (52.6 kg)  05/30/22 112 lb 10.5 oz (51.1 kg)  05/29/22 110 lb (49.9 kg)    Physical Exam Vitals reviewed.  Constitutional:      General: She is not in acute distress.    Appearance: Normal appearance.  HENT:     Head: Normocephalic and atraumatic.     Right Ear: External ear normal.     Left Ear: External ear normal.  Eyes:     General: No scleral icterus.       Right eye: No discharge.        Left eye: No discharge.     Conjunctiva/sclera: Conjunctivae normal.  Neck:     Thyroid: No thyromegaly.  Cardiovascular:     Rate and Rhythm: Normal rate and regular rhythm.  Pulmonary:     Effort: No respiratory distress.     Breath sounds: Normal breath sounds. No wheezing.  Abdominal:     General: Bowel sounds are normal.     Palpations: Abdomen is soft.     Tenderness: There is no abdominal tenderness.  Musculoskeletal:  General: No swelling or tenderness.     Cervical back: Neck supple. No tenderness.  Lymphadenopathy:     Cervical: No cervical adenopathy.   Skin:    Findings: No erythema or rash.  Neurological:     Mental Status: She is alert.  Psychiatric:        Mood and Affect: Mood normal.        Behavior: Behavior normal.    I reviewed and confirmed the patient's current medications  Outpatient Encounter Medications as of 06/12/2022  Medication Sig   acetaminophen (TYLENOL) 500 MG tablet Take 2 tablets (1,000 mg total) by mouth every 6 (six) hours as needed for mild pain.   albuterol (PROVENTIL) (2.5 MG/3ML) 0.083% nebulizer solution Take 3 mLs (2.5 mg total) by nebulization every 4 (four) hours as needed for wheezing or shortness of breath.   albuterol (VENTOLIN HFA) 108 (90 Base) MCG/ACT inhaler Inhale 2 puffs into the lungs every 6 (six) hours as needed for wheezing or shortness of breath.   amLODipine (NORVASC) 5 MG tablet Take 1 tablet (5 mg total) by mouth in the morning and at bedtime.   budesonide-formoterol (SYMBICORT) 160-4.5 MCG/ACT inhaler Inhale 2 puffs into the lungs 2 (two) times daily. (Patient taking differently: Inhale 2 puffs into the lungs 2 (two) times daily as needed (shortness of breath).)   Cholecalciferol (VITAMIN D) 50 MCG (2000 UT) tablet Take 2,000 Units by mouth daily.   diphenhydramine-acetaminophen (TYLENOL PM) 25-500 MG TABS tablet Take 2 tablets by mouth at bedtime as needed (sleep).   fluticasone (FLOVENT HFA) 110 MCG/ACT inhaler Inhale 2 puffs into the lungs 2 (two) times daily as needed (shortness of breath).   gabapentin (NEURONTIN) 100 MG capsule Take 400 mg by mouth at bedtime.   insulin degludec (TRESIBA FLEXTOUCH) 100 UNIT/ML FlexTouch Pen Inject 14 Units into the skin daily. (Patient taking differently: Inject 12 Units into the skin daily with supper.)   loperamide (IMODIUM) 2 MG capsule Take 4 mg by mouth daily.   losartan (COZAAR) 100 MG tablet Take 1 tablet (100 mg total) by mouth at bedtime.   Melatonin 10 MG TABS Take 10 mg by mouth at bedtime.   omeprazole (PRILOSEC) 20 MG capsule TAKE 1  CAPSULE(20 MG) BY MOUTH TWICE DAILY   ONE TOUCH ULTRA TEST test strip PATIENT NEEDS NEW METER STRIPS AND LANCETS FOR ONE TOUCH. HER INSURANCE NO LONGER COVERS ACCUCHEK.   rosuvastatin (CRESTOR) 10 MG tablet TAKE 1 TABLET(10 MG) BY MOUTH DAILY   No facility-administered encounter medications on file as of 06/12/2022.     Lab Results  Component Value Date   WBC 5.3 06/12/2022   HGB 10.9 (L) 06/12/2022   HCT 34.5 (L) 06/12/2022   PLT 228 06/12/2022   GLUCOSE 188 (H) 06/12/2022   CHOL 120 05/31/2022   TRIG 80 05/31/2022   HDL 49 05/31/2022   LDLDIRECT 74.0 04/19/2019   LDLCALC 55 05/31/2022   ALT 27 06/12/2022   AST 24 06/12/2022   NA 138 06/12/2022   K 3.8 06/12/2022   CL 102 06/12/2022   CREATININE 0.74 06/12/2022   BUN 16 06/12/2022   CO2 27 06/12/2022   TSH 2.471 06/12/2022   INR 1.0 05/30/2022   HGBA1C 5.0 05/31/2022   MICROALBUR 1.4 04/16/2022    CT ANGIO HEAD NECK W WO CM  Result Date: 05/31/2022 CLINICAL DATA:  71 year old female status post cerebral angiogram, treated right anterior communicating artery aneurysm. Multiple small posterior circulation infarcts on MRI yesterday. Unrevealing MRA  head and neck. EXAM: CT ANGIOGRAPHY HEAD AND NECK TECHNIQUE: Multidetector CT imaging of the head and neck was performed using the standard protocol during bolus administration of intravenous contrast. Multiplanar CT image reconstructions and MIPs were obtained to evaluate the vascular anatomy. Carotid stenosis measurements (when applicable) are obtained utilizing NASCET criteria, using the distal internal carotid diameter as the denominator. RADIATION DOSE REDUCTION: This exam was performed according to the departmental dose-optimization program which includes automated exposure control, adjustment of the mA and/or kV according to patient size and/or use of iterative reconstruction technique. CONTRAST:  43m OMNIPAQUE IOHEXOL 350 MG/ML SOLN COMPARISON:  Brain MRI, MRA head and neck  yesterday. FINDINGS: CT HEAD Brain: Right paracentral pontine infarct is evident on series 7, image 7 today. But the remaining small posterior fossa and parieto-occipital infarcts on MRI remain occult by CT. No acute intracranial hemorrhage identified. No midline shift, mass effect, or evidence of intracranial mass lesion. No ventriculomegaly. Chronic anterior communicating artery embolization coil pack. No new cerebral infarct identified. Calvarium and skull base: No acute osseous abnormality identified. Paranasal sinuses: Visualized paranasal sinuses and mastoids are stable and well aerated. Orbits: No acute orbit or scalp soft tissue finding. CTA NECK Skeleton: No acute osseous abnormality identified. Upper chest: Negative. Other neck: Negative. Aortic arch: 3 vessel arch configuration. Tortuous proximal great vessels but mild arch atherosclerosis. Right carotid system: Mildly tortuous brachiocephalic artery. Tortuous right CCA origin. Negative right carotid bifurcation. No stenosis to the skull base. Left carotid system: Tortuosity with no atherosclerosis or stenosis. Vertebral arteries: Tortuous proximal right subclavian artery with no plaque or stenosis. Normal right vertebral artery origin. Right vertebral artery appears patent and normal to the skull base. Minimal plaque in the proximal left subclavian artery. Normal left vertebral artery origin. Codominant left vertebral artery is patent and within normal limits to the skull base. CTA HEAD Posterior circulation: Codominant vertebral V4 segments are patent without stenosis to the vertebrobasilar junction. Both a ICAs appear dominant. A small left PICA is patent. Patent basilar artery without stenosis. Patent SCA and PCA origins. Posterior communicating arteries are diminutive or absent. Bilateral PCA branches are within normal limits. Anterior circulation: Both ICA siphons are patent. Mild-to-moderate calcified plaque on the left with only mild distal  cavernous stenosis on that side. Mild right siphon calcified plaque without stenosis. Normal ophthalmic artery origins. Patent carotid termini. Patent MCA and ACA origins. Anterior communicating artery region embolization coil pack. Bilateral ACA branches remain patent, the right A2 appears dominant. No adverse features identified. Left MCA M1 segment and bifurcation are patent without stenosis. Right MCA M1 segment and bifurcation are patent without stenosis. Bilateral MCA branches are within normal limits. Venous sinuses: Patent. Anatomic variants: None significant. Review of the MIP images confirms the above findings IMPRESSION: 1. Negative for large vessel occlusion. No atherosclerosis in the neck. Mild for age atherosclerosis at the skull base with no significant arterial stenosis. 2. 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 No hemorrhage or mass effect. The remaining small ischemic foci by MRI are occult by CT. 3. No new intracranial abnormality. Electronically Signed   By: HGenevie AnnM.D.   On: 05/31/2022 10:44   MR ANGIO HEAD WO CONTRAST  Result Date: 05/30/2022 CLINICAL DATA:  Left-sided weakness and facial droop following cerebral arteriogram. Pontine infarct. EXAM: MRA HEAD WITHOUT CONTRAST TECHNIQUE: Angiographic images of the Circle of Willis were acquired using MRA technique without intravenous contrast. COMPARISON:  Cerebral arteriogram 05/29/2022 FINDINGS: Anterior circulation: Internal carotid  arteries are within normal limits high cervical segments through the ICA termini bilaterally. Some attenuation proximal A2 segments likely artifact from the coil pack. The A1 and M1 segments are normal bilaterally. MCA bifurcations are within limits. The ACA and MCA branch vessels are within normal. Posterior circulation: The vertebral arteries are codominant. Vertebrobasilar junction and basilar artery are normal. Prominent AICA vessels are noted bilaterally. Both posterior cerebral  arteries originate from basilar tip. The PCA branch vessels are within normal limits. Anatomic variants: None Other: None. IMPRESSION: Normal MRA Circle of Willis. Electronically Signed   By: San Morelle M.D.   On: 05/30/2022 17:35   MR Angiogram Neck W or Wo Contrast  Result Date: 05/30/2022 CLINICAL DATA:  Left-sided weakness and facial droop. Acute brainstem infarct. Status post cerebral arteriogram yesterday. EXAM: MRA NECK WITHOUT AND WITH CONTRAST TECHNIQUE: Multiplanar and multiecho pulse sequences of the neck were obtained without and with intravenous contrast. Angiographic images of the neck were obtained using MRA technique without and with intravenous contrast. CONTRAST:  4.28m GADAVIST GADOBUTROL 1 MMOL/ML IV SOLN COMPARISON:  Cerebral arteriogram 05/29/2022 FINDINGS: The time-of-flight images are severely degraded by patient motion. The postcontrast images demonstrate a common origin of the left common carotid artery and innominate artery. Right common carotid artery is within normal limits. Bifurcation unremarkable. Cervical right ICA is normal. The left common carotid artery is. Bifurcation is within normal limits. Cervical left ICA is normal. The vertebral arteries are codominant. Both vertebral arteries originate from the subclavian arteries without significant stenosis. No significant stenosis is present in either vertebral artery in the neck. Intracranial vertebral arteries, vertebrobasilar junction and basilar artery are normal. IMPRESSION: 1. Normal MRA of the neck. 2. No acute or focal abnormality to explain brainstem infarct. Electronically Signed   By: CSan MorelleM.D.   On: 05/30/2022 17:34   MR BRAIN WO CONTRAST  Result Date: 05/30/2022 CLINICAL DATA:  A deficit, acute, stroke suspected. Left-sided weakness facial droop. Cerebral are angiography yesterday. EXAM: MRI HEAD WITHOUT CONTRAST TECHNIQUE: Multiplanar, multiecho pulse sequences of the brain and surrounding  structures were obtained without intravenous contrast. COMPARISON:  None Available. FINDINGS: Brain: The diffusion-weighted images demonstrate acute nonhemorrhagic right paramedian pontine infarct measuring up to 2 cm in maximal diameter. Additional punctate foci of restricted diffusion are present in the right cerebellum and right greater than left occipital lobes. No anterior circulation infarcts are present. T2 signal changes are associated with the areas of acute infarct. No other significant white matter disease is present. The ventricles are of normal size. No significant extraaxial fluid collection is present. The internal auditory canals are within normal limits. Vascular: Flow is present in the major intracranial arteries. Skull and upper cervical spine: The craniocervical junction is normal. Upper cervical spine is within normal limits. Marrow signal is unremarkable. Sinuses/Orbits: The paranasal sinuses and mastoid air cells are clear. Bilateral lens replacements are noted. Globes and orbits are otherwise unremarkable. IMPRESSION: 1. 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 2. Additional punctate foci of acute nonhemorrhagic infarct involving the right cerebellum and right greater than left occipital lobes. These results were called by telephone at the time of interpretation on 05/30/2022 at 5:23 pm to provider Dr. STruett Mainland who verbally acknowledged these results. Electronically Signed   By: CSan MorelleM.D.   On: 05/30/2022 17:24       Assessment & Plan:   Problem List Items Addressed This Visit     Anemia    Seeing GI.  entyvio  treatments for inflammatory polyposis.  Seeing hematology - iron infusions.  Follow.       Aneurysm of anterior communicating artery    Aneurysm of anterior communicating artery - vascular surgery (08/20/21) .  Underwent diagnostic cerebral angiogram with no recurrent aneurysm.  Recommended one year MRI angiogram - follow up.        Asthma    Breathing stable.       Controlled type 2 diabetes mellitus with complication, with long-term current use of insulin (HCC)    On insulin.  Just decreased dose.  Follow sugars. Follow met b and a1c       Depression, major, single episode, mild ([place])    Planning to move soon.  Moving in with her daughter. Has good support.  Follow.       GERD (gastroesophageal reflux disease)    Continue omeprazole.       History of CVA (cerebrovascular accident)    Embolic stroke following diagnostic cerebra angiogram for known ant communicating artery aneurysm s/p embolization.  MRA head and neck were negative.  ECHO - no evidence of embolic source. With history of significant GI bleed in the past due to AVM, polyp bleed, still requiring intermittent IV transfusion, so at this point she was not started on antiplatelet therapy. Discharged with planned OT/PT. PT evaluated.  Planning for 6-8 weeks of therapy.  Talking better.  Feeling better.  Walking better.  Discussed f/u with neurology.       Hypercholesterolemia    Continue crestor. Follow lipid panel and liver function tests.        Hypertension - Primary    Continue amlodipine and losartan.  Follow pressures.  Follow metabolic panel.        Relevant Orders   CBC w/Diff   Comp Met (CMET)   Meningioma ([place])    Seeing neurology.  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  Seeing neurology.  Follow up with NSU.       Psoriatic arthritis ([place])    Stable.  Followed by rheumatology.         Einar Pheasant, MD

## 2022-06-15 ENCOUNTER — Ambulatory Visit: Payer: PPO

## 2022-06-15 DIAGNOSIS — R269 Unspecified abnormalities of gait and mobility: Secondary | ICD-10-CM | POA: Diagnosis not present

## 2022-06-15 DIAGNOSIS — R278 Other lack of coordination: Secondary | ICD-10-CM

## 2022-06-15 DIAGNOSIS — M6281 Muscle weakness (generalized): Secondary | ICD-10-CM

## 2022-06-15 DIAGNOSIS — R262 Difficulty in walking, not elsewhere classified: Secondary | ICD-10-CM

## 2022-06-15 NOTE — Therapy (Addendum)
OUTPATIENT PHYSICAL THERAPY NEURO TREATMENT   Patient Name: Ashley Gross MRN: [id number] DOB:12/01/1950, 71 y.o., female Today's Date: 06/15/2022   PCP: Einar Pheasant, MD  REFERRING PROVIDER: Einar Pheasant, MD    PT End of Session - 06/15/22 1605     Visit Number 2    Number of Visits 17    Date for PT Re-Evaluation 08/05/22    PT Start Time 2778    PT Stop Time 1341    PT Time Calculation (min) 43 min    Equipment Utilized During Treatment Gait belt    Activity Tolerance Patient tolerated treatment well    Behavior During Therapy WFL for tasks assessed/performed              Past Medical History:  Diagnosis Date   Abnormal liver function test 10/06/2012   Anemia, unspecified    Asthma    B12 deficiency 02/11/2019   Bleeding internal hemorrhoids 09/13/2017   Choledocholithiasis 06/29/2012   Formatting of this note might be different from the original. Dilated CBD on abdominal imaging 04/2012   Chronic diarrhea 02/16/2013   Colitis    Complication of anesthesia    asthma attack after shoulder surgery   Diabetes mellitus ([place])    type II   Esophagitis    GERD (gastroesophageal reflux disease)    Heart murmur    for years- nothing to be concerned about.   History of kidney stones    Hx of arteriovenous malformation (AVM)    Hypertension    IBS (irritable bowel syndrome)    Pernicious anemia    Personal history of colonic polyps 02/10/2013   02/08/13 colonoscopy - question of rectal varices, two 3-89m polyps in the sigmoid colon, mass at the hepatic flexure, one 545mpolyp at the hepatic flexure, nodule at the ileocecal valve, congested mucusa in the entire colon, flattened villi mucosa in the terminal ileum.     Pneumonia    Psoriatic arthritis ([facility]   s/p penicillamine, plaquenil, MTX, sulfasalazine.  s/p Embrel, Humira,.  Iritis.     Pure hypercholesterolemia    Rectal bleeding 07/15/2017   Past Surgical History:  Procedure Laterality Date    ABDOMINAL HYSTERECTOMY  1981   ovaries left in place   ANKLE SURGERY     right   BUNIONECTOMY     CHOLECYSTECTOMY  1989   COLONOSCOPY WITH PROPOFOL N/A 12/27/2017   Procedure: COLONOSCOPY WITH PROPOFOL;  Surgeon: VaLin LandsmanMD;  [facility];  Service: Gastroenterology;  Laterality: N/A;   COLONOSCOPY WITH PROPOFOL N/A 01/04/2019   Procedure: COLONOSCOPY WITH PROPOFOL;  Surgeon: VaLin LandsmanMD;  [place] [facility] Service: [place];  Laterality: N/A;  Diabetic - oral and injectable   COLONOSCOPY WITH PROPOFOL N/A 07/24/2021   Procedure: COLONOSCOPY WITH PROPOFOL;  Surgeon: VaLin LandsmanMD;  [facility] Service: Endoscopy;  Laterality: N/A;  Diabetic   ESOPHAGOGASTRODUODENOSCOPY (EGD) WITH PROPOFOL N/A 12/27/2017   Procedure: ESOPHAGOGASTRODUODENOSCOPY (EGD) WITH PROPOFOL;  Surgeon: VaLin LandsmanMD;  [facility];  Service: Gastroenterology;  Laterality: N/A;   HAND SURGERY     right   HEEL SPUR SURGERY     IR 3D INDEPENDENT WKST  10/01/2021   IR ANGIO INTRA EXTRACRAN SEL COM CAROTID INNOMINATE UNI L MOD SED  10/01/2021   IR ANGIO INTRA EXTRACRAN SEL INTERNAL CAROTID UNI R MOD SED  10/01/2021   IR ANGIO INTRA EXTRACRAN SEL INTERNAL CAROTID UNI R MOD SED  05/29/2022   IR ANGIO VERTEBRAL SEL VERTEBRAL BILAT MOD SED  10/01/2021   IR ANGIO VERTEBRAL SEL VERTEBRAL UNI R MOD SED  05/29/2022   IR CT HEAD LTD  10/01/2021   IR TRANSCATH/EMBOLIZ  10/01/2021   IR US GUIDE VASC ACCESS RIGHT  10/01/2021   IR US GUIDE VASC ACCESS RIGHT  05/29/2022   NOSE SURGERY     x2   POLYPECTOMY  01/04/2019   Procedure: POLYPECTOMY;  Surgeon: Lin Landsman, MD;  Location: [place];  Service: Endoscopy;;   POLYPECTOMY N/A 07/24/2021   Procedure: POLYPECTOMY;  Surgeon: Lin Landsman, MD;  Location: [place];  Service: Endoscopy;  Laterality: N/A;   RADIOLOGY WITH ANESTHESIA N/A 10/01/2021    Procedure: Starr Sinclair WITH ANESTHESIA;  Surgeon: Pedro Earls, MD;  Location: [place];  Service: Radiology;  Laterality: N/A;   SHOULDER ARTHROSCOPY WITH OPEN ROTATOR CUFF REPAIR Left 09/20/2018   Procedure: SHOULDER ARTHROSCOPY WITH OPEN ROTATOR CUFF REPAIR;  Surgeon: Corky Mull, MD;  Location: [facility];  Service: Orthopedics;  Laterality: Left;   SHOULDER CLOSED REDUCTION Left 12/21/2018   Procedure: MANIPULATION UNDER ANESTHESIA WITH STEROID INJECTION;  Surgeon: Corky Mull, MD;  Location: [place];  Service: Orthopedics;  Laterality: Left;  Diabetic - insulin and oral meds   UMBILICAL HERNIA REPAIR     XI ROBOTIC ASSISTED INGUINAL HERNIA REPAIR WITH MESH Left 12/16/2021   Procedure: XI ROBOTIC ASSISTED INGUINAL HERNIA REPAIR WITH MESH;  Surgeon: Olean Ree, MD;  Location: [facility];  Service: General;  Laterality: Left;   Patient Active Problem List   Diagnosis Date Noted   Stroke ([place]) 05/30/2022   Iron deficiency anemia 04/18/2022   Anemia 01/25/2022   Left inguinal hernia    Pre-op evaluation 11/26/2021   Meningioma ([place]) 11/05/2021   Inguinal hernia 10/31/2021   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[place]) 10/31/2021   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   Loss of weight    Ear fullness 05/25/2021   Skin lesion 03/15/2021   Rib pain on left side 08/05/2020   Sleep difficulties 06/08/2020   Swelling of lower extremity 11/26/2019   Healthcare maintenance 09/13/2019   Asthma 12/28/2018   Vitamin D deficiency 11/01/2018   Tendinitis of upper biceps tendon of left shoulder 09/20/2018   Depression, major, single episode, mild ([place]) 09/15/2018   Mild intermittent asthma without complication 08/18/5101   Controlled type 2 diabetes mellitus with complication, with long-term current use of insulin ([place]) 09/15/2018   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   Traumatic incomplete tear of left rotator cuff 09/09/2018    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 Admitted to hospital on 05/30/22.  REFERRING DIAG: I63.49 (ICD-10-CM) - Cerebrovascular accident (CVA) due to embolism of other cerebral artery (HCC) I67.1 (ICD-10-CM) - Brain aneurysm   THERAPY DIAG:  Muscle weakness (generalized)  Abnormality of gait and mobility  Other lack of coordination  Difficulty in walking, not elsewhere classified  Rationale for Evaluation and Treatment Rehabilitation  SUBJECTIVE:  SUBJECTIVE STATEMENT: Pt reports feeling well today without pain. Notes SLS on HEP was going well, but that L SLS was difficult secondary to arthritis pain. No falls or LOB since last visit.   Pt accompanied by: self  PERTINENT HISTORY:  Pt presents to OPPT for acute CVA after cerebral angiogram. Pt reporting L sided weakness due to R pontine infarct. Independent at baseline and is back to work as in last week. 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 Currently relies on RW since CVA mainly in community. Pt denies falls since CVA. Ambulates without AD in household and office at church. Pt says weakness has improved but still has instances of dragging LLE or buckling of knee if she has been doing a lot of standing or walking. Does report poor eccentric control descending stairs with LLE. Reports she has been living with her grand daughter and plans to move into her older daughter's house although has been independent currently with all  ADL's in household and is clear to drive from MD.      Per MD in acute care: Ms. JOHNELLA CRUMM is a 71 y.o. female with history of DM2, colitis, proriatic arthritis, asthma, HTN, anemia, left inguinal hernia s/p repair 12/16/21, ACOM aneurysm embolization in 2022 and arteriovenous malformation. Developed aphasia and weakness after cerebral angiogram on Friday. MRI with evidence of acute right pontine infarction.   PAIN:  Are you having pain? No  PRECAUTIONS: None  WEIGHT BEARING RESTRICTIONS No  FALLS: Has patient fallen in last 6 months? No  LIVING ENVIRONMENT: Lives with: lives with their family Lives in: House/apartment Stairs: Yes: External: 4 in front porch, 3 from garage steps; on right going up and can reach both Has following equipment at home: Walker - 2 wheeled  PLOF: Independent  PATIENT GOALS quit relying on RW, return to full function  OBJECTIVE:   DIAGNOSTIC FINDINGS: Stroke:  acute right paramedian pontine infarction with additional punctate foci of infarction in the right cerebellum and right > left occipital lobes. Etiology is procedural complication from recent cerebral angiogram procedure on 7/28.  MRI  Acute nonhemorrhagic 2 cm right paramedian pontine infarct, punctate foci of infarction in the right cerebellum, and right > left occipital lobes   TODAY'S TREATMENT:  Neuromuscular Re-Ed:  Ball toss on airex pad in parallel bars 2x1' bil, no UE support  Ball toss on airex pad in // bars with rotation for ball grab, 2x1' bil, no UE support  Standing balance on airex pad in // bars, palloff press (forward, side/side) 2x1'  [place]/backward walking on balance beam x4 laps, with 2 finger support  Lateral stepping on balance beam, 2 finger support  68f x4 walking with vertical and horizontal head turns, cues for widened BOS  [id number] walking with scanning for sticky notes on wall, x1 LOB, cues for pacing  Therapeutic Exercise:  STS from chair with ball  squeeze no UE support, 2x8  Leg press x10 with ball squeeze, 45#  Cable pulley 4way walk outs, 7.5# x3ea  CGA provided during upright functional activities for pt safety.   PATIENT EDUCATION: Education details: POC, prognosis, HEP Person educated: Patient Education method: ExCustomer service managerducation comprehension: verbalized understanding and returned demonstration   HOME EXERCISE PROGRAM: STS Lateral walking Walking with head turns     GOALS: Goals reviewed with patient? No  SHORT TERM GOALS: Target date: 07/13/2022  Pt will be independent with HEP to improve balance, strength, and gait to return to independence with gait  and ADL's Baseline: Introduced Goal status: INITIAL  LONG TERM GOALS: Target date: 08/10/2022  Pt will improve FOTO to target score to demonstrate clinically significant improvement in perceived functional mobility. Baseline: 53 with target score of 64 Goal status: INITIAL  2.  Pt will improve FGA by 4 points or greater to demonstrate reduced risk of falls with community ambulation tasks.  Baseline: 20/30 Goal status: INITIAL  3.  Pt will improve 6MWT by 164' to demonstrate clinically significant improvement in tolerance for community walking tasks at reduced risk for falls.  Baseline: 1,030' no AD Goal status: INITIAL  4.  Pt will improve 10 m gait speed without AD to >/= 1.2 m/s to demonstrate being a safe Hydrographic surveyor.  Baseline: .93 m/s without AD Goal status: INITIAL  ASSESSMENT:  CLINICAL IMPRESSION: Pt tolerated increased intensity of treatment today. Treatment focused on LE strengthening and balance with standing functional tasks. Increased unsteadiness noted on compliant surfaces with UE outside BOS and with gait and head turns. Pt able to demonstrate improvements in quality of movement with repetition. Poor eccentric quad control noted with STS and leg press requiring increased cueing for motor control. Pt with BLE  edema and petechia-like redness along BLE; encouraged pt to speak with MD regarding this as it could be a sign of clotting/bleeding disorder as pt has hematological hx. Pt will continue to benefit from skilled OPPT services to address LE strength and balance deficits for improved safety with functional mobility.   OBJECTIVE IMPAIRMENTS Abnormal gait, decreased balance, difficulty walking, and decreased strength.   ACTIVITY LIMITATIONS stairs and locomotion level  PARTICIPATION LIMITATIONS: community activity and occupation  PERSONAL FACTORS Age, Fitness, Past/current experiences, Time since onset of injury/illness/exacerbation, and 3+ comorbidities: Anemia, GERD, heart murmur, IBS, HTN  are also affecting patient's functional outcome.   REHAB POTENTIAL: Excellent  CLINICAL DECISION MAKING: Evolving/moderate complexity  EVALUATION COMPLEXITY: Moderate  PLAN: PT FREQUENCY: 1-2x/week  PT DURATION: 8 weeks  PLANNED INTERVENTIONS: Therapeutic exercises, Therapeutic activity, Neuromuscular re-education, Balance training, Gait training, Patient/Family education, Self Care, Stair training, and DME instructions  PLAN FOR NEXT SESSION: Address LLE ankle DF and quad weakness. Continue dynamic and weighted balance tasks    Herminio Commons, PT, DPT [phone] PM,06/15/22

## 2022-06-16 ENCOUNTER — Telehealth: Payer: Self-pay | Admitting: *Deleted

## 2022-06-16 NOTE — Telephone Encounter (Signed)
I called the pt and spoke to her about the b12 level low and Lauren suggested to start her on b12 inj. Pt agreeable and the first appt for it  wed of this week at 1:30 pt states that it is great. She has a my chart message on 8/25 with Dr. Janese Banks . Pt aware.

## 2022-06-17 ENCOUNTER — Other Ambulatory Visit: Payer: Self-pay | Admitting: *Deleted

## 2022-06-17 ENCOUNTER — Inpatient Hospital Stay: Payer: PPO

## 2022-06-17 DIAGNOSIS — E538 Deficiency of other specified B group vitamins: Secondary | ICD-10-CM

## 2022-06-17 DIAGNOSIS — D508 Other iron deficiency anemias: Secondary | ICD-10-CM

## 2022-06-17 DIAGNOSIS — D509 Iron deficiency anemia, unspecified: Secondary | ICD-10-CM | POA: Diagnosis not present

## 2022-06-17 MED ORDER — CYANOCOBALAMIN 1000 MCG/ML IJ SOLN
1000.0000 ug | Freq: Once | INTRAMUSCULAR | Status: AC
  Administered 2022-06-17: 1000 ug via INTRAMUSCULAR
  Filled 2022-06-17: qty 1

## 2022-06-18 ENCOUNTER — Ambulatory Visit: Payer: PPO

## 2022-06-19 ENCOUNTER — Ambulatory Visit
Admission: RE | Admit: 2022-06-19 | Discharge: 2022-06-19 | Disposition: A | Discharge status: Home / Self Care | Payer: PPO | Source: Ambulatory Visit | Attending: Gastroenterology | Admitting: Gastroenterology

## 2022-06-19 ENCOUNTER — Ambulatory Visit
Admission: RE | Admit: 2022-06-19 | Discharge: 2022-06-19 | Disposition: A | Discharge status: Home / Self Care | Payer: PPO | Source: Ambulatory Visit | Attending: Internal Medicine | Admitting: Internal Medicine

## 2022-06-19 DIAGNOSIS — Z1231 Encounter for screening mammogram for malignant neoplasm of breast: Secondary | ICD-10-CM | POA: Diagnosis not present

## 2022-06-19 MED ORDER — VEDOLIZUMAB 300 MG IV SOLR
300.0000 mg | Freq: Once | INTRAVENOUS | Status: AC
  Administered 2022-06-19: 300 mg via INTRAVENOUS
  Filled 2022-06-19: qty 5

## 2022-06-21 ENCOUNTER — Encounter: Payer: Self-pay | Admitting: Internal Medicine

## 2022-06-21 NOTE — Assessment & Plan Note (Signed)
Continue crestor.  Follow lipid panel and liver function tests.  

## 2022-06-21 NOTE — Assessment & Plan Note (Signed)
Seeing GI.  entyvio treatments for inflammatory polyposis.  Seeing hematology - iron infusions.  Follow.

## 2022-06-21 NOTE — Assessment & Plan Note (Signed)
Seeing neurology.  F/u with [facility]

## 2022-06-21 NOTE — Assessment & Plan Note (Signed)
On insulin.  Just decreased dose.  Follow sugars. Follow met b and a1c

## 2022-06-21 NOTE — Assessment & Plan Note (Signed)
Continue amlodipine and losartan.  Follow pressures.  Follow metabolic panel.

## 2022-06-21 NOTE — Assessment & Plan Note (Signed)
Breathing stable.

## 2022-06-21 NOTE — Assessment & Plan Note (Signed)
MRI as outlined.  Seeing neurology.  Follow up with NSU.

## 2022-06-21 NOTE — Assessment & Plan Note (Signed)
Planning to move soon.  Moving in with her daughter. Has good support.  Follow.

## 2022-06-21 NOTE — Assessment & Plan Note (Signed)
Continue omeprazole 

## 2022-06-21 NOTE — Assessment & Plan Note (Addendum)
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  MRA head and neck were negative.  ECHO - no evidence of embolic source. With history of significant GI bleed in the past due to AVM, polyp bleed, still requiring intermittent IV transfusion, so at this point she was not started on antiplatelet therapy. Discharged with planned OT/PT. PT evaluated.  Planning for 6-8 weeks of therapy.  Talking better.  Feeling better.  Walking better.  Discussed f/u with neurology.

## 2022-06-21 NOTE — Assessment & Plan Note (Signed)
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  Underwent diagnostic cerebral angiogram with no recurrent aneurysm.  Recommended one year MRI angiogram - follow up.

## 2022-06-21 NOTE — Assessment & Plan Note (Signed)
Stable.  Followed by rheumatology.

## 2022-06-22 ENCOUNTER — Encounter: Payer: PPO | Admitting: Occupational Therapy

## 2022-06-22 ENCOUNTER — Ambulatory Visit: Payer: PPO | Admitting: Physical Therapy

## 2022-06-24 ENCOUNTER — Ambulatory Visit: Payer: PPO

## 2022-06-24 DIAGNOSIS — R278 Other lack of coordination: Secondary | ICD-10-CM

## 2022-06-24 DIAGNOSIS — R269 Unspecified abnormalities of gait and mobility: Secondary | ICD-10-CM | POA: Diagnosis not present

## 2022-06-24 DIAGNOSIS — M6281 Muscle weakness (generalized): Secondary | ICD-10-CM

## 2022-06-24 DIAGNOSIS — R262 Difficulty in walking, not elsewhere classified: Secondary | ICD-10-CM

## 2022-06-24 NOTE — Therapy (Signed)
OUTPATIENT PHYSICAL THERAPY NEURO TREATMENT   Patient Name: Ashley Gross MRN: [id number] DOB:06/02/1951, 71 y.o., female Today's Date: 06/24/2022   PCP: Einar Pheasant, MD  REFERRING PROVIDER: Einar Pheasant, MD    PT End of Session - 06/24/22 1310     Visit Number 3    Number of Visits 17    Date for PT Re-Evaluation 08/05/22    Authorization Type [employer]    PT Start Time 6237    PT Stop Time 1343    PT Time Calculation (min) 38 min    Equipment Utilized During Treatment Gait belt    Activity Tolerance Patient tolerated treatment well;No increased pain    Behavior During Therapy [facility] for tasks assessed/performed              Past Medical History:  Diagnosis Date   Abnormal liver function test 10/06/2012   Anemia, unspecified    Asthma    B12 deficiency 02/11/2019   Bleeding internal hemorrhoids 09/13/2017   Choledocholithiasis 06/29/2012   Formatting of this note might be different from the original. Dilated CBD on abdominal imaging 04/2012   Chronic diarrhea 02/16/2013   Colitis    Complication of anesthesia    asthma attack after shoulder surgery   Diabetes mellitus ([place])    type II   Esophagitis    GERD (gastroesophageal reflux disease)    Heart murmur    for years- nothing to be concerned about.   History of kidney stones    Hx of arteriovenous malformation (AVM)    Hypertension    IBS (irritable bowel syndrome)    Pernicious anemia    Personal history of colonic polyps 02/10/2013   02/08/13 colonoscopy - question of rectal varices, two 3-47m polyps in the sigmoid colon, mass at the hepatic flexure, one 536mpolyp at the hepatic flexure, nodule at the ileocecal valve, congested mucusa in the entire colon, flattened villi mucosa in the terminal ileum.     Pneumonia    Psoriatic arthritis ([place]   s/p penicillamine, plaquenil, MTX, sulfasalazine.  s/p Embrel, Humira,.  Iritis.     Pure hypercholesterolemia    Rectal bleeding 07/15/2017    Stroke ([place]20[place]   per patient   Past Surgical History:  Procedure Laterality Date   ABDOMINAL HYSTERECTOMY  1981   ovaries left in place   ANKLE SURGERY     right   BUNIONECTOMY     CHOLECYSTECTOMY  1989   COLONOSCOPY WITH PROPOFOL N/A 12/27/2017   Procedure: COLONOSCOPY WITH PROPOFOL;  Surgeon: VaLin LandsmanMD;  [facility]  Service: Gastroenterology;  Laterality: N/A;   COLONOSCOPY WITH PROPOFOL N/A 01/04/2019   Procedure: COLONOSCOPY WITH PROPOFOL;  Surgeon: VaLin LandsmanMD;  [place]oscopy;  Laterality: N/A;  Diabetic - oral and injectable   COLONOSCOPY WITH PROPOFOL N/A 07/24/2021   Procedure: COLONOSCOPY WITH PROPOFOL;  Surgeon: VaLin LandsmanMD;  [place] Service: Endoscopy;  Laterality: N/A;  Diabetic   ESOPHAGOGASTRODUODENOSCOPY (EGD) WITH PROPOFOL N/A 12/27/2017   Procedure: ESOPHAGOGASTRODUODENOSCOPY (EGD) WITH PROPOFOL;  Surgeon: VaLin LandsmanMD;  [facility];  Service: Gastroenterology;  Laterality: N/A;   HAND SURGERY     right   HEEL SPUR SURGERY     IR 3D INDEPENDENT WKST  10/01/2021   IR ANGIO INTRA EXTRACRAN SEL COM CAROTID INNOMINATE UNI L MOD SED  10/01/2021   IR ANGIO INTRA EXTRACRAN SEL INTERNAL CAROTID  UNI R MOD SED  10/01/2021   IR ANGIO INTRA EXTRACRAN SEL INTERNAL CAROTID UNI R MOD SED  05/29/2022   IR ANGIO VERTEBRAL SEL VERTEBRAL BILAT MOD SED  10/01/2021   IR ANGIO VERTEBRAL SEL VERTEBRAL UNI R MOD SED  05/29/2022   IR CT HEAD LTD  10/01/2021   IR TRANSCATH/EMBOLIZ  10/01/2021   IR US GUIDE VASC ACCESS RIGHT  10/01/2021   IR US GUIDE VASC ACCESS RIGHT  05/29/2022   NOSE SURGERY     x2   POLYPECTOMY  01/04/2019   Procedure: POLYPECTOMY;  Surgeon: Lin Landsman, MD;  Location: [place];  Service: Endoscopy;;   POLYPECTOMY N/A 07/24/2021   Procedure: POLYPECTOMY;  Surgeon: Lin Landsman, MD;  Location: [place];  Service: Endoscopy;  Laterality: N/A;   RADIOLOGY WITH ANESTHESIA N/A 10/01/2021   Procedure: Starr Sinclair WITH ANESTHESIA;  Surgeon: Pedro Earls, MD;  Location: [place];  Service: Radiology;  Laterality: N/A;   SHOULDER ARTHROSCOPY WITH OPEN ROTATOR CUFF REPAIR Left 09/20/2018   Procedure: SHOULDER ARTHROSCOPY WITH OPEN ROTATOR CUFF REPAIR;  Surgeon: Corky Mull, MD;  Location: [facility];  Service: Orthopedics;  Laterality: Left;   SHOULDER CLOSED REDUCTION Left 12/21/2018   Procedure: MANIPULATION UNDER ANESTHESIA WITH STEROID INJECTION;  Surgeon: Corky Mull, MD;  Location: [place];  Service: Orthopedics;  Laterality: Left;  Diabetic - insulin and oral meds   UMBILICAL HERNIA REPAIR     XI ROBOTIC ASSISTED INGUINAL HERNIA REPAIR WITH MESH Left 12/16/2021   Procedure: XI ROBOTIC ASSISTED INGUINAL HERNIA REPAIR WITH MESH;  Surgeon: Olean Ree, MD;  Location: [facility];  Service: General;  Laterality: Left;   Patient Active Problem List   Diagnosis Date Noted   History of CVA (cerebrovascular accident) 05/30/2022   Iron deficiency anemia 04/18/2022   Anemia 01/25/2022   Left inguinal hernia    Pre-op evaluation 11/26/2021   Meningioma ([place]) 11/05/2021   Inguinal hernia 10/31/2021   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[place]) 10/31/2021   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   Loss of weight    Ear fullness 05/25/2021   Skin lesion 03/15/2021   Rib pain on left side 08/05/2020   Sleep difficulties 06/08/2020   Swelling of lower extremity 11/26/2019   Healthcare maintenance 09/13/2019   Asthma 12/28/2018   Vitamin D deficiency 11/01/2018   Tendinitis of upper biceps tendon of left shoulder 09/20/2018   Depression, major, single episode, mild ([place]) 09/15/2018   Mild intermittent asthma without complication 75/64/3329   Controlled type 2 diabetes mellitus with complication, with long-term current use of insulin ([place])  09/15/2018   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   Traumatic incomplete tear of left rotator cuff 09/09/2018   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 Admitted to hospital on 05/30/22.  REFERRING DIAG: I63.49 (ICD-10-CM) - Cerebrovascular accident (CVA) due to embolism of other cerebral artery (HCC) I67.1 (ICD-10-CM) - Brain aneurysm   THERAPY DIAG:  Muscle weakness (generalized)  Abnormality of gait and mobility  Other lack of coordination  Difficulty in walking, not elsewhere classified  Rationale for Evaluation and Treatment Rehabilitation  SUBJECTIVE:  SUBJECTIVE STATEMENT:  Pt doing well, busy and stressed with being mid move, but her DTRs are helping. HEP performed in evenings without difficulty.   Pt accompanied by: self  PERTINENT HISTORY:  Pt presents to OPPT for acute CVA after cerebral angiogram. Pt reporting L sided weakness due to R pontine infarct. Independent at baseline and is back to work as in last week. 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 Currently relies on RW since CVA mainly in community. Pt denies falls since CVA. Ambulates without AD in household and office at church. Pt says weakness has improved but still has instances of dragging LLE or buckling of knee if she has been doing a lot of standing or walking. Does report poor eccentric control descending stairs with LLE. Reports she has been living with her grand daughter and plans to  move into her older daughter's house although has been independent currently with all ADL's in household and is clear to drive from [place].   PAIN:  Are you having pain? No  PRECAUTIONS: None  WEIGHT BEARING RESTRICTIONS No  FALLS: Has patient fallen in last 6 months? No  LIVING ENVIRONMENT: Lives with: lives with their family Lives in: House/apartment Stairs: Yes: External: 4 in front porch, 3 from garage steps; on right going up and can reach both Has following equipment at home: Walker - 2 wheeled  PLOF: Independent  PATIENT GOALS quit relying on RW, return to full function  OBJECTIVE:   DIAGNOSTIC FINDINGS: Stroke:  acute right paramedian pontine infarction with additional punctate foci of infarction in the right cerebellum and right > left occipital lobes. Etiology is procedural complication from recent cerebral angiogram procedure on 7/28.  MRI  Acute nonhemorrhagic 2 cm right paramedian pontine infarct, punctate foci of infarction in the right cerebellum, and right > left occipital lobes   TODAY'S TREATMENT 06/24/22 -STS from chair hands free 1x10  -Seated marching 1x30bilat  -STS from chair hands free 1x10  -Seated marching 1x30bilat seated on dynadisc x20 bilat   -overground AMB, no device, 470f, 0.871m, no LOB, no perceived unsteadiness -overground AMB with simulated Rt trash bag carry, 3 laps with long plank walking, airex step up/down, balance beam walking, red mat soft surfaces -cable resisted AMB pulley: 12.5 pelvic resistance, 2x2041fWD, retro, Left, Right; siumlated trash bag carry in RUE for sagittal plane walks,2 LOB with self correction  *cleared pt for AMB without RW, no clear need for RW at this time for shorter community distances.      PATIENT EDUCATION: Education details: POC, prognosis, HEP Person educated: Patient Education method: ExpCustomer service managerucation comprehension: verbalized understanding and returned demonstration   HOME  EXERCISE PROGRAM: STS Lateral walking Walking with head turns     GOALS: Goals reviewed with patient? No  SHORT TERM GOALS: Target date: 07/22/2022  Pt will be independent with HEP to improve balance, strength, and gait to return to independence with gait and ADL's Baseline: Introduced Goal status: INITIAL  LONG TERM GOALS: Target date: 08/19/2022  Pt will improve FOTO to target score to demonstrate clinically significant improvement in perceived functional mobility. Baseline: 53 with target score of 64 Goal status: INITIAL  2.  Pt will improve FGA by 4 points or greater to demonstrate reduced risk of falls with community ambulation tasks.  Baseline: 20/30 Goal status: INITIAL  3.  Pt will improve 6MWT by 164' to demonstrate clinically significant improvement in tolerance for community walking tasks at reduced risk for falls.  Baseline: 1,030' no  AD Goal status: INITIAL  4.  Pt will improve 10 m gait speed without AD to >/= 1.2 m/s to demonstrate being a safe Hydrographic surveyor.  Baseline: .93 m/s without AD Goal status: INITIAL  ASSESSMENT:  CLINICAL IMPRESSION: Pt showing dramatic progressing in mobility, very few LOB this session. Pt reports feeling very near baseline for steadiness for basic mobility. She is cleared for AMB without device at this time. Pt will continue to benefit from skilled OPPT services to address LE strength and balance deficits for improved safety with functional mobility.   OBJECTIVE IMPAIRMENTS Abnormal gait, decreased balance, difficulty walking, and decreased strength.   ACTIVITY LIMITATIONS stairs and locomotion level  PARTICIPATION LIMITATIONS: community activity and occupation  PERSONAL FACTORS Age, Fitness, Past/current experiences, Time since onset of injury/illness/exacerbation, and 3+ comorbidities: Anemia, GERD, heart murmur, IBS, HTN  are also affecting patient's functional outcome.   REHAB POTENTIAL: Excellent  CLINICAL  DECISION MAKING: Evolving/moderate complexity  EVALUATION COMPLEXITY: Moderate  PLAN: PT FREQUENCY: 1-2x/week  PT DURATION: 8 weeks  PLANNED INTERVENTIONS: Therapeutic exercises, Therapeutic activity, Neuromuscular re-education, Balance training, Gait training, Patient/Family education, Self Care, Stair training, and DME instructions  PLAN FOR NEXT SESSION: Address LLE ankle DF and quad weakness. Continue dynamic and weighted balance tasks   1:44 PM, 06/24/22 Etta Grandchild, PT, DPT Physical Therapist - [place] [phone]   ' 1:17 PM,06/24/22

## 2022-06-26 ENCOUNTER — Inpatient Hospital Stay (HOSPITAL_BASED_OUTPATIENT_CLINIC_OR_DEPARTMENT_OTHER): Payer: PPO | Admitting: Oncology

## 2022-06-26 ENCOUNTER — Encounter: Payer: Self-pay | Admitting: Oncology

## 2022-06-26 DIAGNOSIS — E538 Deficiency of other specified B group vitamins: Secondary | ICD-10-CM | POA: Diagnosis not present

## 2022-06-26 DIAGNOSIS — D509 Iron deficiency anemia, unspecified: Secondary | ICD-10-CM | POA: Diagnosis not present

## 2022-06-29 ENCOUNTER — Encounter: Payer: PPO | Admitting: Occupational Therapy

## 2022-06-29 ENCOUNTER — Ambulatory Visit: Payer: PPO

## 2022-07-01 ENCOUNTER — Encounter: Payer: Self-pay | Admitting: Oncology

## 2022-07-01 ENCOUNTER — Ambulatory Visit: Payer: PPO

## 2022-07-02 ENCOUNTER — Other Ambulatory Visit: Payer: Self-pay

## 2022-07-02 ENCOUNTER — Emergency Department
Admission: EM | Admit: 2022-07-02 | Discharge: 2022-07-02 | Disposition: A | Discharge status: Home / Self Care | Payer: PPO | Attending: Emergency Medicine | Admitting: Emergency Medicine

## 2022-07-02 ENCOUNTER — Telehealth: Payer: Self-pay | Admitting: Internal Medicine

## 2022-07-02 ENCOUNTER — Ambulatory Visit: Payer: PPO | Admitting: Physical Therapy

## 2022-07-02 DIAGNOSIS — M7989 Other specified soft tissue disorders: Secondary | ICD-10-CM | POA: Diagnosis present

## 2022-07-02 DIAGNOSIS — E119 Type 2 diabetes mellitus without complications: Secondary | ICD-10-CM | POA: Insufficient documentation

## 2022-07-02 DIAGNOSIS — I10 Essential (primary) hypertension: Secondary | ICD-10-CM | POA: Insufficient documentation

## 2022-07-02 DIAGNOSIS — R600 Localized edema: Secondary | ICD-10-CM | POA: Insufficient documentation

## 2022-07-02 DIAGNOSIS — R609 Edema, unspecified: Secondary | ICD-10-CM

## 2022-07-02 LAB — CBC
HCT: 35.2 % — ABNORMAL LOW (ref 36.0–46.0)
Hemoglobin: 11 g/dL — ABNORMAL LOW (ref 12.0–15.0)
MCH: 29.3 pg (ref 26.0–34.0)
MCHC: 31.3 g/dL (ref 30.0–36.0)
MCV: 93.9 fL (ref 80.0–100.0)
Platelets: 225 10*3/uL (ref 150–400)
RBC: 3.75 MIL/uL — ABNORMAL LOW (ref 3.87–5.11)
RDW: 17.4 % — ABNORMAL HIGH (ref 11.5–15.5)
WBC: 4.9 10*3/uL (ref 4.0–10.5)
nRBC: 0 % (ref 0.0–0.2)

## 2022-07-02 LAB — COMPREHENSIVE METABOLIC PANEL
ALT: 16 U/L (ref 0–44)
AST: 22 U/L (ref 15–41)
Albumin: 3.5 g/dL (ref 3.5–5.0)
Alkaline Phosphatase: 86 U/L (ref 38–126)
Anion gap: 7 (ref 5–15)
BUN: 14 mg/dL (ref 8–23)
CO2: 25 mmol/L (ref 22–32)
Calcium: 8.4 mg/dL — ABNORMAL LOW (ref 8.9–10.3)
Chloride: 111 mmol/L (ref 98–111)
Creatinine, Ser: 0.83 mg/dL (ref 0.44–1.00)
GFR, Estimated: >60 mL/min (ref >60)
Glucose, Bld: 193 mg/dL — ABNORMAL HIGH (ref 70–99)
Potassium: 3.4 mmol/L — ABNORMAL LOW (ref 3.5–5.1)
Sodium: 143 mmol/L (ref 135–145)
Total Bilirubin: 0.6 mg/dL (ref 0.3–1.2)
Total Protein: 6 g/dL — ABNORMAL LOW (ref 6.5–8.1)

## 2022-07-02 LAB — BRAIN NATRIURETIC PEPTIDE: B Natriuretic Peptide: 192.1 pg/mL — ABNORMAL HIGH (ref 0.0–100.0)

## 2022-07-02 LAB — TSH: TSH: 2.021 u[IU]/mL (ref 0.350–4.500)

## 2022-07-02 NOTE — Telephone Encounter (Signed)
Spoke with pt and she stated that she is not having any acute symptoms. Pt is okay with taking the lasix for two days and then following up with you. She already has an appt scheduled for Tuesday 07/07/2022 with you at 11 am.

## 2022-07-02 NOTE — Telephone Encounter (Signed)
Pt called stating she just left the ER and they did not find any blood clot or anything wrong with her heart. The Dr stated she just need some lasix (fluid pill) and to follow up with provider.

## 2022-07-02 NOTE — Telephone Encounter (Signed)
Patient called and both legs swollen, left leg is worse. Both legs have red spots, some warmness to the spots. No appointments at location. Patient transferred to Access Nurse to be triaged.

## 2022-07-02 NOTE — Telephone Encounter (Signed)
Kim called from Access Nurse to state patient needs to be seen in the next four hours.  I checked our schedule and we do not have anything here, but we do have availability with some of our offices in [place].  Patient states she does not really want to go to [place].  Patient states she is moving and has to be out of her residence by 5pm today, so she really needs to have this taken care of quickly.  I received a message to transfer call to [facility], CMA, call was transferred.

## 2022-07-02 NOTE — Telephone Encounter (Signed)
Please call and confirm how she is doing.  I can send in lasix, but it would be a low dose and she would only take for two days and then call with update. If any acute symptoms, schedule appt as we discussed.

## 2022-07-02 NOTE — ED Notes (Signed)
Pt presents to ED with c/o of having LLE swelling. Pt states PCP sent pt here for further eval. Pt does have 2+ pitting edema to lower legs up to knee area.   Pt denies any CP or SOB.

## 2022-07-02 NOTE — ED Triage Notes (Signed)
Pt states she has started having swelling in her feet and ankles that she seen her PCP for, however over the last couple days she noticed the swelling has gotten worse and is now into her lower legs- pt was told to come here by her PCP- pt also has some redness to the legs- pt denies pain and itchiness- pt states swelling also gets worse throughout the day

## 2022-07-02 NOTE — ED Notes (Signed)
D/C and reasons to return dicussed. Pt ambulatory with steady gait on D/C. NAD noted.

## 2022-07-02 NOTE — ED Provider Notes (Signed)
[facility] Provider Note    Event Date/Time   First MD Initiated Contact with Patient 07/02/22 1334     (approximate)   History   Chief Complaint: Leg Swelling   HPI  Ashley Gross is a 71 y.o. female with a history of diabetes, hypertension, GERD, IBS who comes the ED due to swelling in bilateral feet and ankles for the past few weeks.  Worse with being upright, better with laying down and having legs elevated.  Denies any chest pain or shortness of breath.  No exertional symptoms.  No orthopnea or PND.  No weight changes.  No excessive feeling of hot or cold.  Denies any history of hypothyroidism or heart failure.  No other symptoms at all.  She tried to go to her primary care office but was told to come to the ED for evaluation.     Physical Exam   Triage Vital Signs: ED Triage Vitals  Enc Vitals Group     BP 07/02/22 1141 (!) 142/88     Pulse Rate 07/02/22 1141 66     Resp 07/02/22 1141 16     Temp 07/02/22 1141 98.6 F (37 C)     Temp Source 07/02/22 1141 Oral     SpO2 07/02/22 1141 98 %     Weight 07/02/22 1142 110 lb (49.9 kg)     Height 07/02/22 1142 '5\' 4"'$  (1.626 m)     Head Circumference --      Peak Flow --      Pain Score 07/02/22 1142 0     Pain Loc --      Pain Edu? --      Excl. in [place]? --     Most recent vital signs: Vitals:   07/02/22 1141  BP: (!) 142/88  Pulse: 66  Resp: 16  Temp: 98.6 F (37 C)  SpO2: 98%    General: Awake, no distress.  CV:  Good peripheral perfusion.  Regular rate and rhythm.  Systolic murmur.  No S3 or S4.  Peripheral pulses. Resp:  Normal effort.  Clear to auscultation bilaterally without any wheezing or crackles. Abd:  No distention.  Soft and nontender Other:  Trace pitting edema bilateral lower extremities.  Symmetric calf circumference.  No calf tenderness, palpable cord, negative Homans' sign.   ED Results / Procedures / Treatments   Labs (all labs ordered are listed, but only  abnormal results are displayed) Labs Reviewed  CBC - Abnormal; Notable for the following components:      Result Value   RBC 3.75 (*)    Hemoglobin 11.0 (*)    HCT 35.2 (*)    RDW 17.4 (*)    All other components within normal limits  COMPREHENSIVE METABOLIC PANEL - Abnormal; Notable for the following components:   Potassium 3.4 (*)    Glucose, Bld 193 (*)    Calcium 8.4 (*)    Total Protein 6.0 (*)    All other components within normal limits  BRAIN NATRIURETIC PEPTIDE  TSH     EKG    RADIOLOGY    PROCEDURES:  Procedures   MEDICATIONS ORDERED IN ED: Medications - No data to display   IMPRESSION / MDM / [place] / ED COURSE  I reviewed the triage vital signs and the nursing notes.  Differential diagnosis includes, but is not limited to, AKI, anemia, hypoalbuminemia, electrolyte abnormality, hypothyroidism, venous insufficiency  Patient's presentation is most consistent with acute presentation with potential threat to life or bodily function.  Patient presents with peripheral edema, no other symptoms.  No cardiac symptoms at all.   Considering the patient's symptoms, medical history, and physical examination today, I have low suspicion for ACS, PE, TAD, pneumothorax, carditis, mediastinitis, pneumonia, CHF, or sepsis. Do not see any evidence of DVT or soft tissue infection.  Vital signs and serum labs unremarkable, exam is otherwise normal.  Patient request to be discharged and I think no further work-up is necessary at this time.  She is stable to follow-up with her PCP for further evaluation.       FINAL CLINICAL IMPRESSION(S) / ED DIAGNOSES   Final diagnoses:  Peripheral edema     Rx / DC Orders   ED Discharge Orders     None        Note:  This document was prepared using Dragon voice recognition software and may include unintentional dictation errors.   Carrie Mew, MD 07/02/22 1352

## 2022-07-02 NOTE — Telephone Encounter (Signed)
With increased swelling and changes in skin, etc - needs to be evaluated today.

## 2022-07-03 MED ORDER — FUROSEMIDE 20 MG PO TABS: 20.0000 mg | ORAL_TABLET | Freq: Every day | ORAL | 0 refills | Status: DC | PRN

## 2022-07-03 NOTE — Addendum Note (Signed)
Addended by: Alisa Graff on: 07/03/2022 03:17 AM   Modules accepted: Orders

## 2022-07-03 NOTE — Telephone Encounter (Signed)
Rx sent in for lasix '20mg'$ .  She is to take one tablet q day x 2 days.  Keep f/u appt.  Any problems call/evaluation

## 2022-07-05 ENCOUNTER — Other Ambulatory Visit: Payer: Self-pay | Admitting: Internal Medicine

## 2022-07-06 ENCOUNTER — Encounter: Payer: Self-pay | Admitting: Oncology

## 2022-07-06 NOTE — Progress Notes (Signed)
I connected with Ashley Gross on 07/06/22 at  3:00 PM EDT by video enabled telemedicine visit and verified that I am speaking with the correct person using two identifiers.   I discussed the limitations, risks, security and privacy concerns of performing an evaluation and management service by telemedicine and the availability of in-person appointments. I also discussed with the patient that there may be a patient responsible charge related to this service. The patient expressed understanding and agreed to proceed.  Other persons participating in the visit and their role in the encounter:  none  Patient's location:  home Provider's location:  work  Risk analyst Complaint: Follow-up of iron deficiency anemia  History of present illness: patient is a 71 year-old female who was referred to GI for symptoms of diarrhea and iron deficiency anemia.  She has a history of inflammatory polyposis of the colon based on colonoscopy in September 2022.  She had some large polyps removed at that time which helped with anemia.  However patient continued to have intermittent rectal bleeding and after seeing Dr. Marius Ditch plan is to try [facility] for improvement of her inflammatory polyposis.    Patient received 2 doses of Feraheme in July 2023  Interval history currently patientReports doing well and dates that her fatigue has improved.   Review of Systems  Constitutional:  Negative for chills, fever, malaise/fatigue and weight loss.  HENT:  Negative for congestion, ear discharge and nosebleeds.   Eyes:  Negative for blurred vision.  Respiratory:  Negative for cough, hemoptysis, sputum production, shortness of breath and wheezing.   Cardiovascular:  Negative for chest pain, palpitations, orthopnea and claudication.  Gastrointestinal:  Negative for abdominal pain, blood in stool, constipation, diarrhea, heartburn, melena, nausea and vomiting.  Genitourinary:  Negative for dysuria, flank pain, frequency, hematuria and  urgency.  Musculoskeletal:  Negative for back pain, joint pain and myalgias.  Skin:  Negative for rash.  Neurological:  Negative for dizziness, tingling, focal weakness, seizures, weakness and headaches.  Endo/Heme/Allergies:  Does not bruise/bleed easily.  Psychiatric/Behavioral:  Negative for depression and suicidal ideas. The patient does not have insomnia.     Allergies  Allergen Reactions   Aspirin Other (See Comments)    Increased bleeding   Beta Adrenergic Blockers     3rd degree blockage    Lisinopril Cough   Mtx Support [Cobalamine Combinations] Other (See Comments)    Respiratory symptoms   Vasotec [Enalapril] Cough   Verapamil Other (See Comments)    Heart block   Voltaren [Diclofenac Sodium]     Pt unsure of reaction    Erythromycin Other (See Comments)    Gi intolerance   Indocin [Indomethacin]     Upset stomach   Jardiance [Empagliflozin] Other (See Comments)    Recurrent yeast infections, even with washout and rechallenge    Past Medical History:  Diagnosis Date   Abnormal liver function test 10/06/2012   Anemia, unspecified    Asthma    B12 deficiency 02/11/2019   Bleeding internal hemorrhoids 09/13/2017   Choledocholithiasis 06/29/2012   Formatting of this note might be different from the original. Dilated CBD on abdominal imaging 04/2012   Chronic diarrhea 02/16/2013   Colitis    Complication of anesthesia    asthma attack after shoulder surgery   Diabetes mellitus ([place])    type II   Esophagitis    GERD (gastroesophageal reflux disease)    Heart murmur    for years- nothing to be concerned about.   History of  kidney stones    Hx of arteriovenous malformation (AVM)    Hypertension    IBS (irritable bowel syndrome)    Pernicious anemia    Personal history of colonic polyps 02/10/2013   02/08/13 colonoscopy - question of rectal varices, two 3-4m polyps in the sigmoid colon, mass at the hepatic flexure, one 559mpolyp at the hepatic flexure, nodule at  the ileocecal valve, congested mucusa in the entire colon, flattened villi mucosa in the terminal ileum.     Pneumonia    Psoriatic arthritis ([place]   s/p penicillamine, plaquenil, MTX, sulfasalazine.  s/p Embrel, Humira,.  Iritis.     Pure hypercholesterolemia    Rectal bleeding 07/15/2017   Stroke ([place]/28/2023   per patient    Past Surgical History:  Procedure Laterality Date   ABDOMINAL HYSTERECTOMY  1981   ovaries left in place   ANKLE SURGERY     right   BUNIONECTOMY     CHOLECYSTECTOMY  1989   COLONOSCOPY WITH PROPOFOL N/A 12/27/2017   Procedure: COLONOSCOPY WITH PROPOFOL;  Surgeon: VaLin LandsmanMD;  [facility];  Service: Gastroenterology;  Laterality: N/A;   COLONOSCOPY WITH PROPOFOL N/A 01/04/2019   Procedure: COLONOSCOPY WITH PROPOFOL;  Surgeon: VaLin LandsmanMD;  [place];  Laterality: N/A;  Diabetic - oral and injectable   COLONOSCOPY WITH PROPOFOL N/A 07/24/2021   Procedure: COLONOSCOPY WITH PROPOFOL;  Surgeon: VaLin LandsmanMD;  [place];  Laterality: N/A;  Diabetic   ESOPHAGOGASTRODUODENOSCOPY (EGD) WITH PROPOFOL N/A 12/27/2017   Procedure: ESOPHAGOGASTRODUODENOSCOPY (EGD) WITH PROPOFOL;  Surgeon: VaLin LandsmanMD;  [facility];  Service: Gastroenterology;  Laterality: N/A;   HAND SURGERY     right   HEEL SPUR SURGERY     IR 3D INDEPENDENT WKST  10/01/2021   IR ANGIO INTRA EXTRACRAN SEL COM CAROTID INNOMINATE UNI L MOD SED  10/01/2021   IR ANGIO INTRA EXTRACRAN SEL INTERNAL CAROTID UNI R MOD SED  10/01/2021   IR ANGIO INTRA EXTRACRAN SEL INTERNAL CAROTID UNI R MOD SED  05/29/2022   IR ANGIO VERTEBRAL SEL VERTEBRAL BILAT MOD SED  10/01/2021   IR ANGIO VERTEBRAL SEL VERTEBRAL UNI R MOD SED  05/29/2022   IR CT HEAD LTD  10/01/2021   IR TRANSCATH/EMBOLIZ  10/01/2021   IR [place] VASC ACCESS RIGHT  10/01/2021   IR [place] VASC ACCESS  RIGHT  05/29/2022   NOSE SURGERY     x2   POLYPECTOMY  01/04/2019   Procedure: POLYPECTOMY;  Surgeon: VaLin LandsmanMD;  [place];;   POLYPECTOMY N/A 07/24/2021   Procedure: POLYPECTOMY;  Surgeon: VaLin LandsmanMD;  [place] [facility];  Laterality: N/A;   RADIOLOGY WITH ANESTHESIA N/A 10/01/2021   Procedure: EMStarr SinclairITH ANESTHESIA;  Surgeon: dePedro EarlsMD;  [place] [facility]: Radiology;  Laterality: N/A;   SHOULDER ARTHROSCOPY WITH OPEN ROTATOR CUFF REPAIR Left 09/20/2018   Procedure: SHOULDER ARTHROSCOPY WITH OPEN ROTATOR CUFF REPAIR;  Surgeon: PoCorky MullMD;  [facility];  Service: Orthopedics;  Laterality: Left;   SHOULDER CLOSED REDUCTION Left 12/21/2018   Procedure: MANIPULATION UNDER ANESTHESIA WITH STEROID INJECTION;  Surgeon: PoCorky MullMD;  [facility];  Laterality: Left;  Diabetic - insulin and oral meds   UMBILICAL HERNIA REPAIR  XI ROBOTIC ASSISTED INGUINAL HERNIA REPAIR WITH MESH Left 12/16/2021   Procedure: XI ROBOTIC ASSISTED INGUINAL HERNIA REPAIR WITH MESH;  Surgeon: Olean Ree, MD;  [facility];  Service: General;  Laterality: Left;    Social History   Socioeconomic History   Marital status: Widowed    Spouse name: Not on file   Number of children: 2   Years of education: Not on file   Highest education level: Not on file  Occupational History   Not on file  Tobacco Use   Smoking status: Never   Smokeless tobacco: Never  Vaping Use   Vaping Use: Never used  Substance and Sexual Activity   Alcohol use: Never   Drug use: Never   Sexual activity: Not Currently  Other Topics Concern   Not on file  Social History Narrative   She is married and has two children   [place] daughter lives with her.   Social Determinants of Health   Financial Resource Strain: Low Risk  (05/13/2022)    Overall Financial Resource Strain (CARDIA)    Difficulty of Paying Living Expenses: Not hard at all  Food Insecurity: No Food Insecurity (05/13/2022)   Hunger Vital Sign    Worried About Running Out of Food in the Last Year: Never true    Ran Out of Food in the Last Year: Never true  Transportation Needs: No Transportation Needs (05/13/2022)   PRAPARE - Hydrologist (Medical): No    Lack of Transportation (Non-Medical): No  Physical Activity: Not on file  Stress: No Stress Concern Present (05/13/2022)   [place]    Feeling of Stress : Not at all  Social Connections: Unknown (01/07/2021)   Social Connection and Isolation Panel [NHANES]    Frequency of Communication with Friends and Family: More than three times a week    Frequency of Social Gatherings with Friends and Family: Not on file    Attends Religious Services: Not on file    Active Member of Clubs or Organizations: Not on file    Attends Archivist Meetings: Not on file    Marital Status: Not on file  Intimate Partner Violence: Not At Risk (05/13/2022)   Humiliation, Afraid, Rape, and Kick questionnaire    Fear of Current or Ex-Partner: No    Emotionally Abused: No    Physically Abused: No    Sexually Abused: No    Family History  Problem Relation Age of Onset   Diabetes Other    Hypertension Other    Breast cancer Neg Hx    Colon cancer Neg Hx      Current Outpatient Medications:    acetaminophen (TYLENOL) 500 MG tablet, Take 2 tablets (1,000 mg total) by mouth every 6 (six) hours as needed for mild pain., Disp: , Rfl:    albuterol (PROVENTIL) (2.5 MG/3ML) 0.083% nebulizer solution, Take 3 mLs (2.5 mg total) by nebulization every 4 (four) hours as needed for wheezing or shortness of breath., Disp: 360 mL, Rfl: 12   albuterol (VENTOLIN HFA) 108 (90 Base) MCG/ACT inhaler, Inhale 2 puffs into the lungs every 6 (six) hours  as needed for wheezing or shortness of breath., Disp: , Rfl:    amLODipine (NORVASC) 5 MG tablet, Take 1 tablet (5 mg total) by mouth in the morning and at bedtime., Disp: 180 tablet, Rfl: 1   budesonide-formoterol (SYMBICORT) 160-4.5 MCG/ACT inhaler, Inhale 2 puffs into the lungs  2 (two) times daily. (Patient taking differently: Inhale 2 puffs into the lungs 2 (two) times daily as needed (shortness of breath).), Disp: 3 each, Rfl: 3   Cholecalciferol (VITAMIN D) 50 MCG (2000 UT) tablet, Take 2,000 Units by mouth daily., Disp: , Rfl:    diphenhydramine-acetaminophen (TYLENOL PM) 25-500 MG TABS tablet, Take 2 tablets by mouth at bedtime as needed (sleep)., Disp: , Rfl:    fluticasone (FLOVENT HFA) 110 MCG/ACT inhaler, Inhale 2 puffs into the lungs 2 (two) times daily as needed (shortness of breath)., Disp: , Rfl:    gabapentin (NEURONTIN) 100 MG capsule, Take 400 mg by mouth at bedtime., Disp: , Rfl:    insulin degludec (TRESIBA FLEXTOUCH) 100 UNIT/ML FlexTouch Pen, Inject 14 Units into the skin daily. (Patient taking differently: Inject 12 Units into the skin daily with supper.), Disp: 12 mL, Rfl: 2   loperamide (IMODIUM) 2 MG capsule, Take 4 mg by mouth daily., Disp: , Rfl:    Melatonin 10 MG TABS, Take 10 mg by mouth at bedtime., Disp: , Rfl:    ONE TOUCH ULTRA TEST test strip, PATIENT NEEDS NEW METER STRIPS AND LANCETS FOR ONE TOUCH. HER INSURANCE NO LONGER COVERS ACCUCHEK., Disp: 100 each, Rfl: PRN   rosuvastatin (CRESTOR) 10 MG tablet, TAKE 1 TABLET(10 MG) BY MOUTH DAILY, Disp: 90 tablet, Rfl: 3   furosemide (LASIX) 20 MG tablet, Take 1 tablet (20 mg total) by mouth daily as needed., Disp: 10 tablet, Rfl: 0   losartan (COZAAR) 100 MG tablet, TAKE 1 TABLET(100 MG) BY MOUTH AT BEDTIME, Disp: 90 tablet, Rfl: 1   omeprazole (PRILOSEC) 20 MG capsule, TAKE 1 CAPSULE(20 MG) BY MOUTH TWICE DAILY, Disp: 180 capsule, Rfl: 3  MM 3D SCREEN BREAST BILATERAL  Result Date: 06/19/2022 CLINICAL DATA:   Screening. EXAM: DIGITAL SCREENING BILATERAL MAMMOGRAM WITH TOMOSYNTHESIS AND CAD TECHNIQUE: Bilateral screening digital craniocaudal and mediolateral oblique mammograms were obtained. Bilateral screening digital breast tomosynthesis was performed. The images were evaluated with computer-aided detection. COMPARISON:  Previous exam(s). ACR Breast Density Category b: There are scattered areas of fibroglandular density. FINDINGS: There are no findings suspicious for malignancy. IMPRESSION: No mammographic evidence of malignancy. A result letter of this screening mammogram will be mailed directly to the patient. RECOMMENDATION: Screening mammogram in one year. (Code:SM-B-01Y) BI-RADS CATEGORY  1: Negative. Electronically Signed   By: Evangeline Dakin M.D.   On: 06/19/2022 14:46    No images are attached to the encounter.      Latest Ref Rng & Units 07/02/2022   11:44 AM  CMP  Glucose 70 - 99 mg/dL 193   BUN 8 - 23 mg/dL 14   Creatinine 0.44 - 1.00 mg/dL 0.83   Sodium 135 - 145 mmol/L 143   Potassium 3.5 - 5.1 mmol/L 3.4   Chloride 98 - 111 mmol/L 111   CO2 22 - 32 mmol/L 25   Calcium 8.9 - 10.3 mg/dL 8.4   Total Protein 6.5 - 8.1 g/dL 6.0   Total Bilirubin 0.3 - 1.2 mg/dL 0.6   Alkaline Phos 38 - 126 U/L 86   AST 15 - 41 U/L 22   ALT 0 - 44 U/L 16       Latest Ref Rng & Units 07/02/2022   11:44 AM  CBC  WBC 4.0 - 10.5 K/uL 4.9   Hemoglobin 12.0 - 15.0 g/dL 11.0   Hematocrit 36.0 - 46.0 % 35.2   Platelets 150 - 400 K/uL 225  Observation/objective: Appears in no acute distress over video visit today.  Breathing is nonlabored  Assessment and plan: Patient is a 71 year old female and this is a routine follow-up visit for iron deficiency anemia   Patient's hemoglobin has gone up from 9.6-11 after receiving IV iron.  Ferritin levels have increased from 8.8 201 with an iron saturation of 25%.  She does not require any IV iron at this time.  B12 levels are low at 171 with normal folate  levels.  She did receive 1 dose of B12 injection and will continue monthly B12 injections at this time.  CBC ferritin and iron studies and B12 levels in 3 in 6 months and I will see her back in 6 months    Follow-up instructions:  I discussed the assessment and treatment plan with the patient. The patient was provided an opportunity to ask questions and all were answered. The patient agreed with the plan and demonstrated an understanding of the instructions.   The patient was advised to call back or seek an in-person evaluation if the symptoms worsen or if the condition fails to improve as anticipated.  I provided 12 minutes of face-to-face video visit time during this encounter. Time spent in reviewing labs and formulating treatment plan for her   Visit Diagnosis: 1. Iron deficiency anemia, unspecified iron deficiency anemia type   2. B12 deficiency     Dr. Randa Evens, MD, MPH [facility] Tel- [phone] 07/06/2022 2:09 PM

## 2022-07-07 ENCOUNTER — Other Ambulatory Visit: Payer: Self-pay

## 2022-07-07 ENCOUNTER — Encounter: Payer: Self-pay | Admitting: Internal Medicine

## 2022-07-07 ENCOUNTER — Ambulatory Visit (INDEPENDENT_AMBULATORY_CARE_PROVIDER_SITE_OTHER): Payer: PPO | Admitting: Internal Medicine

## 2022-07-07 VITALS — BP 118/70 | HR 73 | Temp 97.7°F | Ht 64.0 in | Wt 115.8 lb

## 2022-07-07 DIAGNOSIS — L4050 Arthropathic psoriasis, unspecified: Secondary | ICD-10-CM | POA: Diagnosis not present

## 2022-07-07 DIAGNOSIS — E7800 Pure hypercholesterolemia, unspecified: Secondary | ICD-10-CM | POA: Diagnosis not present

## 2022-07-07 DIAGNOSIS — F320 Major depressive disorder, single episode, mild: Secondary | ICD-10-CM

## 2022-07-07 DIAGNOSIS — F439 Reaction to severe stress, unspecified: Secondary | ICD-10-CM

## 2022-07-07 DIAGNOSIS — D500 Iron deficiency anemia secondary to blood loss (chronic): Secondary | ICD-10-CM

## 2022-07-07 DIAGNOSIS — I10 Essential (primary) hypertension: Secondary | ICD-10-CM

## 2022-07-07 DIAGNOSIS — I671 Cerebral aneurysm, nonruptured: Secondary | ICD-10-CM | POA: Diagnosis not present

## 2022-07-07 DIAGNOSIS — E876 Hypokalemia: Secondary | ICD-10-CM

## 2022-07-07 DIAGNOSIS — K51419 Inflammatory polyps of colon with unspecified complications: Secondary | ICD-10-CM | POA: Diagnosis not present

## 2022-07-07 DIAGNOSIS — K219 Gastro-esophageal reflux disease without esophagitis: Secondary | ICD-10-CM | POA: Diagnosis not present

## 2022-07-07 DIAGNOSIS — E118 Type 2 diabetes mellitus with unspecified complications: Secondary | ICD-10-CM | POA: Diagnosis not present

## 2022-07-07 DIAGNOSIS — M7989 Other specified soft tissue disorders: Secondary | ICD-10-CM

## 2022-07-07 DIAGNOSIS — Z8673 Personal history of transient ischemic attack (TIA), and cerebral infarction without residual deficits: Secondary | ICD-10-CM

## 2022-07-07 DIAGNOSIS — Z794 Long term (current) use of insulin: Secondary | ICD-10-CM

## 2022-07-07 DIAGNOSIS — J4520 Mild intermittent asthma, uncomplicated: Secondary | ICD-10-CM

## 2022-07-07 LAB — IBC + FERRITIN
Ferritin: 53.7 ng/mL (ref 10.0–291.0)
Iron: 41 ug/dL — ABNORMAL LOW (ref 42–145)
Saturation Ratios: 13 % — ABNORMAL LOW (ref 20.0–50.0)
TIBC: 315 ug/dL (ref 250.0–450.0)
Transferrin: 225 mg/dL (ref 212.0–360.0)

## 2022-07-07 LAB — CBC WITH DIFFERENTIAL/PLATELET
Basophils Absolute: 0.1 10*3/uL (ref 0.0–0.1)
Basophils Relative: 0.8 % (ref 0.0–3.0)
Eosinophils Absolute: 0.1 10*3/uL (ref 0.0–0.7)
Eosinophils Relative: 1.3 % (ref 0.0–5.0)
HCT: 35.7 % — ABNORMAL LOW (ref 36.0–46.0)
Hemoglobin: 11.7 g/dL — ABNORMAL LOW (ref 12.0–15.0)
Lymphocytes Relative: 27 % (ref 12.0–46.0)
Lymphs Abs: 1.8 10*3/uL (ref 0.7–4.0)
MCHC: 32.9 g/dL (ref 30.0–36.0)
MCV: 92.7 fl (ref 78.0–100.0)
Monocytes Absolute: 0.3 10*3/uL (ref 0.1–1.0)
Monocytes Relative: 5.1 % (ref 3.0–12.0)
Neutro Abs: 4.3 10*3/uL (ref 1.4–7.7)
Neutrophils Relative %: 65.8 % (ref 43.0–77.0)
Platelets: 228 10*3/uL (ref 150.0–400.0)
RBC: 3.84 Mil/uL — ABNORMAL LOW (ref 3.87–5.11)
RDW: 18.3 % — ABNORMAL HIGH (ref 11.5–15.5)
WBC: 6.5 10*3/uL (ref 4.0–10.5)

## 2022-07-07 LAB — BASIC METABOLIC PANEL
BUN: 19 mg/dL (ref 6–23)
CO2: 26 mEq/L (ref 19–32)
Calcium: 8.4 mg/dL (ref 8.4–10.5)
Chloride: 106 mEq/L (ref 96–112)
Creatinine, Ser: 0.87 mg/dL (ref 0.40–1.20)
GFR: 67.02 mL/min (ref >60.00)
Glucose, Bld: 115 mg/dL — ABNORMAL HIGH (ref 70–99)
Potassium: 3 mEq/L — ABNORMAL LOW (ref 3.5–5.1)
Sodium: 142 mEq/L (ref 135–145)

## 2022-07-07 MED ORDER — POTASSIUM CHLORIDE CRYS ER 10 MEQ PO TBCR: 10.0000 meq | EXTENDED_RELEASE_TABLET | Freq: Two times a day (BID) | ORAL | 0 refills | Status: DC

## 2022-07-07 NOTE — Progress Notes (Signed)
Patient ID: Ashley Gross, female   DOB: [birth year], 71 y.o.   MRN: [id number]   Subjective:    Patient ID: Ashley Gross, female    DOB: 04-Jul-1951, 71 y.o.   MRN: [id number]   Patient here for  Chief Complaint  Patient presents with   Follow-up    ER/ swollen ankles   .   HPI Here for ER follow up.  Evaluated ER 07/02/22 - lower extremity swelling.  BNP elevated -  192.  Was instructed to contact PCP for "fluid pill".  She was started on lasx - with plans to take for a couple of days.  Has started lasix 07/02/22 (or 07/03/22) and has taken daily.  Legs are better.  Swelling is better.  Still some lower extremity swelling, but improved per her report.  No chest pain.  Breathing stable.  No increased cough or congestion.  She does report increased persistent intermittent rectal bleeding. Has started treatments - entyvio.  Due to f/u with Dr Marius Ditch 07/15/22.  Increased stress with moving,etc.     Past Medical History:  Diagnosis Date   Abnormal liver function test 10/06/2012   Anemia, unspecified    Asthma    B12 deficiency 02/11/2019   Bleeding internal hemorrhoids 09/13/2017   Choledocholithiasis 06/29/2012   Formatting of this note might be different from the original. Dilated CBD on abdominal imaging 04/2012   Chronic diarrhea 02/16/2013   Colitis    Complication of anesthesia    asthma attack after shoulder surgery   Diabetes mellitus ([place])    type II   Esophagitis    GERD (gastroesophageal reflux disease)    Heart murmur    for years- nothing to be concerned about.   History of kidney stones    Hx of arteriovenous malformation (AVM)    Hypertension    IBS (irritable bowel syndrome)    Pernicious anemia    Personal history of colonic polyps 02/10/2013   02/08/13 colonoscopy - question of rectal varices, two 3-20m polyps in the sigmoid colon, mass at the hepatic flexure, one 541mpolyp at the hepatic flexure, nodule at the ileocecal valve, congested mucusa in the entire colon,  flattened villi mucosa in the terminal ileum.     Pneumonia    [place]oriatic [place] ([place]   s/p penicillamine, plaquenil, MTX, sulfasalazine.  s/p Embrel, Humira,.  Iritis.     Pure hypercholesterolemia    Rectal bleeding 07/15/2017   St[place] ([facility]   per patient   Past Surgical History:  Procedure Laterality Date   ABDOMINAL HYSTERECTOMY  1981   ovaries left in place   ANKLE SURGERY     right   BUNIONECTOMY     CHOLECYSTECTOMY  1989   COLONOSCOPY WITH PROPOFOL N/A 12/27/2017   Procedure: COLONOSCOPY WITH PROPOFOL;  Surgeon: VaLin LandsmanMD;  [facility];  Service: Gastroenterology;  Laterality: N/A;   COLONOSCOPY WITH PROPOFOL N/A 01/04/2019   Procedure: COLONOSCOPY WITH PROPOFOL;  Surgeon: VaLin LandsmanMD;  [place]: [place];  Laterality: N/A;  Diabetic - oral and injectable   COLONOSCOPY WITH PROPOFOL N/A 07/24/2021   Procedure: COLONOSCOPY WITH PROPOFOL;  Surgeon: VaLin LandsmanMD;  [place]: Endoscopy;  Laterality: N/A;  Diabetic   ESOPHAGOGASTRODUODENOSCOPY (EGD) WITH PROPOFOL N/A 12/27/2017   Procedure: ESOPHAGOGASTRODUODENOSCOPY (EGD) WITH PROPOFOL;  Surgeon: VaLin LandsmanMD;  [facility];  Service: Gastroenterology;  Laterality: N/A;   HAND SURGERY  right   HEEL SPUR SURGERY     IR 3D INDEPENDENT WKST  10/01/2021   IR ANGIO INTRA EXTRACRAN SEL COM CAROTID INNOMINATE UNI L MOD SED  10/01/2021   IR ANGIO INTRA EXTRACRAN SEL INTERNAL CAROTID UNI R MOD SED  10/01/2021   IR ANGIO INTRA EXTRACRAN SEL INTERNAL CAROTID UNI R MOD SED  05/29/2022   IR ANGIO VERTEBRAL SEL VERTEBRAL BILAT MOD SED  10/01/2021   IR ANGIO VERTEBRAL SEL VERTEBRAL UNI R MOD SED  05/29/2022   IR CT HEAD LTD  10/01/2021   IR TRANSCATH/EMBOLIZ  10/01/2021   IR US GUIDE VASC ACCESS RIGHT  10/01/2021   IR US GUIDE VASC ACCESS RIGHT  05/29/2022   NOSE SURGERY     x2   POLYPECTOMY   01/04/2019   Procedure: POLYPECTOMY;  Surgeon: Lin Landsman, MD;  Location: [place];  Service: Endoscopy;;   POLYPECTOMY N/A 07/24/2021   Procedure: POLYPECTOMY;  Surgeon: Lin Landsman, MD;  Location: [place];  Service: Endoscopy;  Laterality: N/A;   RADIOLOGY WITH ANESTHESIA N/A 10/01/2021   Procedure: Starr Sinclair WITH ANESTHESIA;  Surgeon: Pedro Earls, MD;  Location: [place];  Service: Radiology;  Laterality: N/A;   SHOULDER ARTHROSCOPY WITH OPEN ROTATOR CUFF REPAIR Left 09/20/2018   Procedure: SHOULDER ARTHROSCOPY WITH OPEN ROTATOR CUFF REPAIR;  Surgeon: Corky Mull, MD;  Location: [facility];  Service: Orthopedics;  Laterality: Left;   SHOULDER CLOSED REDUCTION Left 12/21/2018   Procedure: MANIPULATION UNDER ANESTHESIA WITH STEROID INJECTION;  Surgeon: Corky Mull, MD;  Location: [place];  Service: Orthopedics;  Laterality: Left;  Diabetic - insulin and oral meds   UMBILICAL HERNIA REPAIR     XI ROBOTIC ASSISTED INGUINAL HERNIA REPAIR WITH MESH Left 12/16/2021   Procedure: XI ROBOTIC ASSISTED INGUINAL HERNIA REPAIR WITH MESH;  Surgeon: Olean Ree, MD;  Location: [facility];  Service: General;  Laterality: Left;   Family History  Problem Relation Age of Onset   Diabetes Other    Hypertension Other    Breast cancer Neg Hx    Colon cancer Neg Hx    Social History   Socioeconomic History   Marital status: Widowed    Spouse name: Not on file   Number of children: 2   Years of education: Not on file   Highest education level: Not on file  Occupational History   Not on file  Tobacco Use   Smoking status: Never   Smokeless tobacco: Never  Vaping Use   Vaping Use: Never used  Substance and Sexual Activity   Alcohol use: Never   Drug use: Never   Sexual activity: Not Currently  Other Topics Concern   Not on file  Social History Narrative   She is married and has two children   Andrew daughter lives with her.    Social Determinants of Health   Financial Resource Strain: Low Risk  (05/13/2022)   Overall Financial Resource Strain (CARDIA)    Difficulty of Paying Living Expenses: Not hard at all  Food Insecurity: No Food Insecurity (05/13/2022)   Hunger Vital Sign    Worried About Running Out of Food in the Last Year: Never true    Ran Out of Food in the Last Year: Never true  Transportation Needs: No Transportation Needs (05/13/2022)   PRAPARE - Hydrologist (Medical): No    Lack of Transportation (Non-Medical): No  Physical Activity: Not on file  Stress:  No Stress Concern Present (05/13/2022)   [place]    Feeling of Stress : Not at all  Social Connections: Unknown (01/07/2021)   Social Connection and Isolation Panel [NHANES]    Frequency of Communication with Friends and Family: More than three times a week    Frequency of Social Gatherings with Friends and Family: Not on file    Attends Religious Services: Not on file    Active Member of Clubs or Organizations: Not on file    Attends Archivist Meetings: Not on file    Marital Status: Not on file     Review of Systems  Constitutional:  Negative for appetite change and unexpected weight change.  HENT:  Negative for congestion and sinus pressure.   Respiratory:  Negative for cough and chest tightness.        Breathing stable   Cardiovascular:  Positive for leg swelling. Negative for chest pain and palpitations.  Gastrointestinal:  Negative for nausea and vomiting.       Rectal bleeding as outlined   Genitourinary:  Negative for difficulty urinating and dysuria.  Musculoskeletal:  Negative for joint swelling and myalgias.  Skin:  Negative for color change and rash.  Neurological:  Negative for dizziness and headaches.  Psychiatric/Behavioral:  Negative for agitation and dysphoric mood.        Increased stress as outlined.         Objective:     BP 118/70 (BP Location: Right Arm, Patient Position: Sitting, Cuff Size: Large)   Pulse 73   Temp 97.7 F (36.5 C) (Oral)   Ht '5\' 4"'  (1.626 m)   Wt 115 lb 12.8 oz (52.5 kg)   SpO2 99%   BMI 19.88 kg/m  Wt Readings from Last 3 Encounters:  07/07/22 115 lb 12.8 oz (52.5 kg)  07/02/22 110 lb (49.9 kg)  06/12/22 116 lb (52.6 kg)    Physical Exam Vitals reviewed.  Constitutional:      General: She is not in acute distress.    Appearance: Normal appearance.  HENT:     Head: Normocephalic and atraumatic.     Right Ear: External ear normal.     Left Ear: External ear normal.  Eyes:     General: No scleral icterus.       Right eye: No discharge.        Left eye: No discharge.     Conjunctiva/sclera: Conjunctivae normal.  Neck:     Thyroid: No thyromegaly.  Cardiovascular:     Rate and Rhythm: Normal rate and regular rhythm.  Pulmonary:     [place]: No respiratory distress.     Breath sounds: Normal breath sounds. No wheezing.  Abdominal:     General: Bowel sounds are normal.     Palpations: Abdomen is soft.     Tenderness: There is no abdominal tenderness.  Musculoskeletal:        General: No swelling or tenderness.     Cervical back: Neck supple. No tenderness.  Lymphadenopathy:     Cervical: No cervical adenopathy.  Skin:    Findings: No erythema or rash.  Neurological:     Mental Status: She is alert.  Psychiatric:        Mood and Affect: Mood normal.        Behavior: Behavior normal.      Outpatient Encounter Medications as of 07/07/2022  Medication Sig   acetaminophen (TYLENOL) 500 MG tablet Take 2  tablets (1,000 mg total) by mouth every 6 (six) hours as needed for mild pain.   albuterol (PROVENTIL) (2.5 MG/3ML) 0.083% nebulizer solution Take 3 mLs (2.5 mg total) by nebulization every 4 (four) hours as needed for wheezing or shortness of breath.   albuterol (VENTOLIN HFA) 108 (90 Base) MCG/ACT inhaler Inhale 2 puffs into the lungs every 6 (six)  hours as needed for wheezing or shortness of breath.   amLODipine (NORVASC) 5 MG tablet Take 1 tablet (5 mg total) by mouth in the morning and at bedtime.   budesonide-formoterol (SYMBICORT) 160-4.5 MCG/ACT inhaler Inhale 2 puffs into the lungs 2 (two) times daily. (Patient taking differently: Inhale 2 puffs into the lungs 2 (two) times daily as needed (shortness of breath).)   Cholecalciferol (VITAMIN D) 50 MCG (2000 UT) tablet Take 2,000 Units by mouth daily.   diphenhydramine-acetaminophen (TYLENOL PM) 25-500 MG TABS tablet Take 2 tablets by mouth at bedtime as needed (sleep).   fluticasone (FLOVENT HFA) 110 MCG/ACT inhaler Inhale 2 puffs into the lungs 2 (two) times daily as needed (shortness of breath).   furosemide (LASIX) 20 MG tablet Take 1 tablet (20 mg total) by mouth daily as needed.   gabapentin (NEURONTIN) 100 MG capsule Take 400 mg by mouth at bedtime.   insulin degludec (TRESIBA FLEXTOUCH) 100 UNIT/ML FlexTouch Pen Inject 14 Units into the skin daily. (Patient taking differently: Inject 12 Units into the skin daily with supper.)   loperamide (IMODIUM) 2 MG capsule Take 4 mg by mouth daily.   losartan (COZAAR) 100 MG tablet TAKE 1 TABLET(100 MG) BY MOUTH AT BEDTIME   Melatonin 10 MG TABS Take 10 mg by mouth at bedtime.   omeprazole (PRILOSEC) 20 MG capsule TAKE 1 CAPSULE(20 MG) BY MOUTH TWICE DAILY   ONE TOUCH ULTRA TEST test strip PATIENT NEEDS NEW METER STRIPS AND LANCETS FOR ONE TOUCH. HER INSURANCE NO LONGER COVERS ACCUCHEK.   rosuvastatin (CRESTOR) 10 MG tablet TAKE 1 TABLET(10 MG) BY MOUTH DAILY   No facility-administered encounter medications on file as of 07/07/2022.     Lab Results  Component Value Date   WBC 6.5 07/07/2022   HGB 11.7 (L) 07/07/2022   HCT 35.7 (L) 07/07/2022   PLT 228.0 07/07/2022   GLUCOSE 115 (H) 07/07/2022   CHOL 120 05/31/2022   TRIG 80 05/31/2022   HDL 49 05/31/2022   LDLDIRECT 74.0 04/19/2019   LDLCALC 55 05/31/2022   ALT 16 07/02/2022   AST  22 07/02/2022   NA 142 07/07/2022   K 3.0 (L) 07/07/2022   CL 106 07/07/2022   CREATININE 0.87 07/07/2022   BUN 19 07/07/2022   CO2 26 07/07/2022   TSH 2.021 07/02/2022   INR 1.0 05/30/2022   HGBA1C 5.0 05/31/2022   MICROALBUR 1.4 04/16/2022       Assessment & Plan:   Problem List Items Addressed This Visit     Anemia    Seeing GI.  entyvio treatments for inflammatory polyposis.  Seeing hematology - iron infusions.  Follow.       Relevant Orders   CBC with Differential/Platelet (Completed)   IBC + Ferritin (Completed)   Aneurysm of anterior communicating artery    Aneurysm of anterior communicating artery - vascular surgery (08/20/21) .  Underwent diagnostic cerebral angiogram with no recurrent aneurysm.  Recommended one year MRI angiogram - follow up.       Asthma    Breathing stable.       Controlled type 2 diabetes mellitus  with complication, with long-term current use of insulin (HCC)    On insulin.  Follow sugars. Follow met b and a1c       Depression, major, single episode, mild (HCC)    Increased stress as outlined.  Follow. Has good support.       GERD (gastroesophageal reflux disease)    Continue omeprazole.       History of CVA (cerebrovascular accident)    Embolic stroke following diagnostic cerebra angiogram for known ant communicating artery aneurysm s/p embolization.  MRA head and neck were negative.  ECHO - no evidence of embolic source. With history of significant GI bleed in the past due to AVM, polyp bleed, still requiring intermittent IV transfusion, so at this point she was not started on antiplatelet therapy.      Hypercholesterolemia    Continue crestor. Follow lipid panel and liver function tests.        Hypertension - Primary    Continue amlodipine and losartan.  Follow pressures.  Follow metabolic panel.        Relevant Orders   Basic metabolic panel (Completed)   Inflammatory polyps of colon ([place])    S/p colonoscopy 07/24/21 as  outlined.  Continues f/u with GI.  Started entyvio.  Persistent rectal bleeding as outlined.  Has f/u with GI next week.  Discuss with GI regarding further treatment/intervention. Check cbc today.       Psoriatic arthritis ([place])    Stable.  Has been followed by rheumatology.        Stress    Increased stress with the move and underlying medical issues.  Discussed.  Has good support.  Follow.       Swelling of lower extremity    Increased swelling as outlined.  Prescribed lasix.  Has taken for the last several days.  Swelling has improved.  Hold scheduled lasix.  Check metabolic panel.  Discussed weighing daily.  Had ECHO 05/2022 - EF 65% with grade I DD.         Einar Pheasant, MD

## 2022-07-08 ENCOUNTER — Encounter: Payer: Self-pay | Admitting: Internal Medicine

## 2022-07-08 ENCOUNTER — Ambulatory Visit: Payer: PPO | Attending: Internal Medicine

## 2022-07-08 DIAGNOSIS — R278 Other lack of coordination: Secondary | ICD-10-CM | POA: Insufficient documentation

## 2022-07-08 DIAGNOSIS — M6281 Muscle weakness (generalized): Secondary | ICD-10-CM | POA: Insufficient documentation

## 2022-07-08 DIAGNOSIS — R262 Difficulty in walking, not elsewhere classified: Secondary | ICD-10-CM | POA: Insufficient documentation

## 2022-07-08 DIAGNOSIS — R269 Unspecified abnormalities of gait and mobility: Secondary | ICD-10-CM | POA: Insufficient documentation

## 2022-07-08 NOTE — Therapy (Deleted)
OUTPATIENT PHYSICAL THERAPY NEURO TREATMENT   Patient Name: Ashley Gross MRN: [id number] DOB:1951/06/10, 71 y.o., female Today's Date: 07/08/2022   PCP: Einar Pheasant, MD  REFERRING PROVIDER: Einar Pheasant, MD       Past Medical History:  Diagnosis Date   Abnormal liver function test 10/06/2012   Anemia, unspecified    Asthma    B12 deficiency 02/11/2019   Bleeding internal hemorrhoids 09/13/2017   Choledocholithiasis 06/29/2012   Formatting of this note might be different from the original. Dilated CBD on abdominal imaging 04/2012   Chronic diarrhea 02/16/2013   Colitis    Complication of anesthesia    asthma attack after shoulder surgery   Diabetes mellitus ([place])    type II   Esophagitis    GERD (gastroesophageal reflux disease)    Heart murmur    for years- nothing to be concerned about.   History of kidney stones    Hx of arteriovenous malformation (AVM)    Hypertension    IBS (irritable bowel syndrome)    Pernicious anemia    Personal history of colonic polyps 02/10/2013   02/08/13 colonoscopy - question of rectal varices, two 3-62m polyps in the sigmoid colon, mass at the hepatic flexure, one 550mpolyp at the hepatic flexure, nodule at the ileocecal valve, congested mucusa in the entire colon, flattened villi mucosa in the terminal ileum.     Pneumonia    Psoriatic arthritis ([facility]   s/p penicillamine, plaquenil, MTX, sulfasalazine.  s/p Embrel, Humira,.  Iritis.     Pure hypercholesterolemia    Rectal bleeding 07/15/2017   St[place] ([place]   per patient   Past Surgical History:  Procedure Laterality Date   ABDOMINAL HYSTERECTOMY  1981   ovaries left in place   ANKLE SURGERY     right   BUNIONECTOMY     CHOLECYSTECTOMY  1989   COLONOSCOPY WITH PROPOFOL N/A 12/27/2017   Procedure: COLONOSCOPY WITH PROPOFOL;  Surgeon: VaLin LandsmanMD;  [facility]  Service: Gastroenterology;  Laterality: N/A;   COLONOSCOPY WITH PROPOFOL  N/A 01/04/2019   Procedure: COLONOSCOPY WITH PROPOFOL;  Surgeon: VaLin LandsmanMD;  [facility];  Laterality: N/A;  Diabetic - oral and injectable   COLONOSCOPY WITH PROPOFOL N/A 07/24/2021   Procedure: COLONOSCOPY WITH PROPOFOL;  Surgeon: VaLin LandsmanMD;  [place]: [place];  Laterality: N/A;  Diabetic   ESOPHAGOGASTRODUODENOSCOPY (EGD) WITH PROPOFOL N/A 12/27/2017   Procedure: ESOPHAGOGASTRODUODENOSCOPY (EGD) WITH PROPOFOL;  Surgeon: VaLin LandsmanMD;  [facility];  Service: Gastroenterology;  Laterality: N/A;   HAND SURGERY     right   HEEL SPUR SURGERY     IR 3D INDEPENDENT WKST  10/01/2021   IR ANGIO INTRA EXTRACRAN SEL COM CAROTID INNOMINATE UNI L MOD SED  10/01/2021   IR ANGIO INTRA EXTRACRAN SEL INTERNAL CAROTID UNI R MOD SED  10/01/2021   IR ANGIO INTRA EXTRACRAN SEL INTERNAL CAROTID UNI R MOD SED  05/29/2022   IR ANGIO VERTEBRAL SEL VERTEBRAL BILAT MOD SED  10/01/2021   IR ANGIO VERTEBRAL SEL VERTEBRAL UNI R MOD SED  05/29/2022   IR CT HEAD LTD  10/01/2021   IR TRANSCATH/EMBOLIZ  10/01/2021   IR [place] VASC ACCESS RIGHT  10/01/2021   IR [place]IDE VASC ACCESS RIGHT  05/29/2022   NOSE SURGERY     x2   POLYPECTOMY  01/04/2019   Procedure: POLYPECTOMY;  Surgeon:  Lin Landsman, MD;  Location: [place];  Service: Endoscopy;;   POLYPECTOMY N/A 07/24/2021   Procedure: POLYPECTOMY;  Surgeon: Lin Landsman, MD;  Location: [place];  Service: Endoscopy;  Laterality: N/A;   RADIOLOGY WITH ANESTHESIA N/A 10/01/2021   Procedure: Starr Sinclair WITH ANESTHESIA;  Surgeon: Pedro Earls, MD;  Location: [place];  Service: Radiology;  Laterality: N/A;   SHOULDER ARTHROSCOPY WITH OPEN ROTATOR CUFF REPAIR Left 09/20/2018   Procedure: SHOULDER ARTHROSCOPY WITH OPEN ROTATOR CUFF REPAIR;  Surgeon: Corky Mull, MD;  Location: [facility];  Service: Orthopedics;   Laterality: Left;   SHOULDER CLOSED REDUCTION Left 12/21/2018   Procedure: MANIPULATION UNDER ANESTHESIA WITH STEROID INJECTION;  Surgeon: Corky Mull, MD;  Location: [place];  Service: Orthopedics;  Laterality: Left;  Diabetic - insulin and oral meds   UMBILICAL HERNIA REPAIR     XI ROBOTIC ASSISTED INGUINAL HERNIA REPAIR WITH MESH Left 12/16/2021   Procedure: XI ROBOTIC ASSISTED INGUINAL HERNIA REPAIR WITH MESH;  Surgeon: Olean Ree, MD;  [facility];  Service: General;  Laterality: Left;   Patient Active Problem List   Diagnosis Date Noted   History of CVA (cerebrovascular accident) 05/30/2022   Iron deficiency anemia 04/18/2022   Anemia 01/25/2022   Left inguinal hernia    Pre-op evaluation 11/26/2021   Meningioma ([place]) 11/05/2021   Inguinal hernia 10/31/2021   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[place]) 10/31/2021   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   Loss of weight    Ear fullness 05/25/2021   Skin lesion 03/15/2021   Rib pain on left side 08/05/2020   Sleep difficulties 06/08/2020   Swelling of lower extremity 11/26/2019   Healthcare maintenance 09/13/2019   Asthma 12/28/2018   Vitamin D deficiency 11/01/2018   Tendinitis of upper biceps tendon of left shoulder 09/20/2018   Depression, major, single episode, mild ([place]) 09/15/2018   Mild intermittent asthma without complication 83/07/4075   Controlled type 2 diabetes mellitus with complication, with long-term current use of insulin ([place]) 09/15/2018   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   Traumatic incomplete tear of left rotator cuff 09/09/2018   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 Admitted to hospital on 05/30/22.  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  Rationale for Evaluation and Treatment Rehabilitation  SUBJECTIVE:  SUBJECTIVE STATEMENT:  Pt doing well, busy and stressed with being mid move, but her DTRs are helping. HEP performed in evenings without difficulty.   Pt accompanied by: self  PERTINENT HISTORY:  Pt presents to OPPT for acute CVA after cerebral angiogram. Pt reporting L sided weakness due to R pontine infarct. Independent at baseline and is back to work as in last week. 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 Currently relies on RW since CVA mainly in community. Pt denies falls since CVA. Ambulates without AD in household and office at church. Pt says weakness has improved but still has instances of dragging LLE or buckling of knee if she has been doing a lot of standing or walking. Does report poor eccentric control descending stairs with LLE. Reports she has been living with her grand daughter and plans to move into her older daughter's house although has been independent currently with all ADL's in household and is clear to drive from [place].   PAIN:  Are you having pain? No  PRECAUTIONS: None  WEIGHT BEARING RESTRICTIONS No  FALLS: Has patient fallen in last 6 months? No  LIVING ENVIRONMENT: Lives with: lives with their family Lives in: House/apartment Stairs: Yes: External: 4 in front porch, 3 from garage steps; on right going up and can reach both Has following equipment at home: Walker - 2 wheeled  PLOF: Independent  PATIENT GOALS quit relying on RW,  return to full function  OBJECTIVE:   DIAGNOSTIC FINDINGS: Stroke:  acute right paramedian pontine infarction with additional punctate foci of infarction in the right cerebellum and right > left occipital lobes. Etiology is procedural complication from recent cerebral angiogram procedure on 7/28.  MRI  Acute nonhemorrhagic 2 cm right paramedian pontine infarct, punctate foci of infarction in the right cerebellum, and right > left occipital lobes   TODAY'S TREATMENT 07/08/22 -STS from chair hands free 1x10  -Seated marching 1x30bilat  -STS from chair hands free 1x10  -Seated marching 1x30bilat seated on dynadisc x20 bilat   -overground AMB, no device, 466f, 0.841m, no LOB, no perceived unsteadiness -overground AMB with simulated Rt trash bag carry, 3 laps with long plank walking, airex step up/down, balance beam walking, red mat soft surfaces -cable resisted AMB pulley: 12.5 pelvic resistance, 2x2017fWD, retro, Left, Right; siumlated trash bag carry in RUE for sagittal plane walks,2 LOB with self correction  *cleared pt for AMB without RW, no clear need for RW at this time for shorter community distances.      PATIENT EDUCATION: Education details: POC, prognosis, HEP Person educated: Patient Education method: ExpCustomer service managerucation comprehension: verbalized understanding and returned demonstration   HOME EXERCISE PROGRAM: STS Lateral walking Walking with head turns     GOALS: Goals reviewed with patient? No  SHORT TERM GOALS: Target date: 07/08/22  Pt will be independent with HEP to improve balance, strength, and gait to return to independence with gait and ADL's Baseline: Introduced Goal status: INITIAL  LONG TERM GOALS: Target date: 08/05/22  Pt will improve FOTO to target score to demonstrate clinically significant improvement in perceived functional mobility. Baseline: 53 with target score of 64 Goal status: INITIAL  2.  Pt will improve FGA by 4  points or greater to demonstrate reduced risk of falls with community ambulation tasks.  Baseline: 20/30 Goal status: INITIAL  3.  Pt will improve 6MWT by 164' to demonstrate clinically significant improvement in tolerance for community walking tasks at reduced risk for falls.  Baseline: 1,030' no  AD Goal status: INITIAL  4.  Pt will improve 10 m gait speed without AD to >/= 1.2 m/s to demonstrate being a safe Hydrographic surveyor.  Baseline: .93 m/s without AD Goal status: INITIAL  ASSESSMENT:  CLINICAL IMPRESSION: Pt showing dramatic progressing in mobility, very few LOB this session. Pt reports feeling very near baseline for steadiness for basic mobility. She is cleared for AMB without device at this time. Pt will continue to benefit from skilled OPPT services to address LE strength and balance deficits for improved safety with functional mobility.   OBJECTIVE IMPAIRMENTS Abnormal gait, decreased balance, difficulty walking, and decreased strength.   ACTIVITY LIMITATIONS stairs and locomotion level  PARTICIPATION LIMITATIONS: community activity and occupation  PERSONAL FACTORS Age, Fitness, Past/current experiences, Time since onset of injury/illness/exacerbation, and 3+ comorbidities: Anemia, GERD, heart murmur, IBS, HTN  are also affecting patient's functional outcome.   REHAB POTENTIAL: Excellent  CLINICAL DECISION MAKING: Evolving/moderate complexity  EVALUATION COMPLEXITY: Moderate  PLAN: PT FREQUENCY: 1-2x/week  PT DURATION: 8 weeks  PLANNED INTERVENTIONS: Therapeutic exercises, Therapeutic activity, Neuromuscular re-education, Balance training, Gait training, Patient/Family education, Self Care, Stair training, and DME instructions  PLAN FOR NEXT SESSION: Address LLE ankle DF and quad weakness. Continue dynamic and weighted balance tasks   3:56 PM, 07/08/22 Particia Lather PT  3:56 PM,07/08/22

## 2022-07-09 ENCOUNTER — Ambulatory Visit: Payer: PPO | Admitting: Physical Therapy

## 2022-07-10 ENCOUNTER — Ambulatory Visit: Payer: PPO | Admitting: Physical Therapy

## 2022-07-10 DIAGNOSIS — R262 Difficulty in walking, not elsewhere classified: Secondary | ICD-10-CM

## 2022-07-10 DIAGNOSIS — R269 Unspecified abnormalities of gait and mobility: Secondary | ICD-10-CM | POA: Diagnosis not present

## 2022-07-10 DIAGNOSIS — M6281 Muscle weakness (generalized): Secondary | ICD-10-CM

## 2022-07-10 DIAGNOSIS — R278 Other lack of coordination: Secondary | ICD-10-CM | POA: Diagnosis not present

## 2022-07-10 NOTE — Therapy (Signed)
OUTPATIENT PHYSICAL THERAPY NEURO TREATMENT   Patient Name: Ashley Gross MRN: [id number] DOB:[birth year], 71 y.o., female Today's Date: 07/10/2022   PCP: Einar Pheasant, MD  REFERRING PROVIDER: Einar Pheasant, MD    PT End of Session - 07/10/22 1020     Visit Number 4    Number of Visits 17    Date for PT Re-Evaluation 08/05/22    Authorization Type [employer]    PT Start Time 1021    PT Stop Time 1100    PT Time Calculation (min) 39 min    Equipment Utilized During Treatment Gait belt    Activity Tolerance Patient tolerated treatment well;No increased pain    Behavior During Therapy [facility] for tasks assessed/performed               Past Medical History:  Diagnosis Date   Abnormal liver function test 10/06/2012   Anemia, unspecified    Asthma    B12 deficiency 02/11/2019   Bleeding internal hemorrhoids 09/13/2017   Choledocholithiasis 06/29/2012   Formatting of this note might be different from the original. Dilated CBD on abdominal imaging 04/2012   Chronic diarrhea 02/16/2013   Colitis    Complication of anesthesia    asthma attack after shoulder surgery   Diabetes mellitus ([place])    type II   Esophagitis    GERD (gastroesophageal reflux disease)    Heart murmur    for years- nothing to be concerned about.   History of kidney stones    Hx of arteriovenous malformation (AVM)    Hypertension    IBS (irritable bowel syndrome)    Pernicious anemia    Personal history of colonic polyps 02/10/2013   02/08/13 colonoscopy - question of rectal varices, two 3-6m polyps in the sigmoid colon, mass at the hepatic flexure, one 513mpolyp at the hepatic flexure, nodule at the ileocecal valve, congested mucusa in the entire colon, flattened villi mucosa in the terminal ileum.     Pneumonia    Psoriatic arthritis ([facility]   s/p penicillamine, plaquenil, MTX, sulfasalazine.  s/p Embrel, Humira,.  Iritis.     Pure hypercholesterolemia    Rectal bleeding  07/15/2017   St[place] ([place]20[place]   per patient   Past Surgical History:  Procedure Laterality Date   ABDOMINAL HYSTERECTOMY  1981   ovaries left in place   ANKLE SURGERY     right   BUNIONECTOMY     CHOLECYSTECTOMY  1989   COLONOSCOPY WITH PROPOFOL N/A 12/27/2017   Procedure: COLONOSCOPY WITH PROPOFOL;  Surgeon: VaLin LandsmanMD;  [facility];  Service: Gastroenterology;  Laterality: N/A;   COLONOSCOPY WITH PROPOFOL N/A 01/04/2019   Procedure: COLONOSCOPY WITH PROPOFOL;  Surgeon: VaLin LandsmanMD;  [place]: Endoscopy;  Laterality: N/A;  Diabetic - oral and injectable   COLONOSCOPY WITH PROPOFOL N/A 07/24/2021   Procedure: COLONOSCOPY WITH PROPOFOL;  Surgeon: VaLin LandsmanMD;  [place] Service: Endoscopy;  Laterality: N/A;  Diabetic   ESOPHAGOGASTRODUODENOSCOPY (EGD) WITH PROPOFOL N/A 12/27/2017   Procedure: ESOPHAGOGASTRODUODENOSCOPY (EGD) WITH PROPOFOL;  Surgeon: VaLin LandsmanMD;  [facility]  Service: Gastroenterology;  Laterality: N/A;   HAND SURGERY     right   HEEL SPUR SURGERY     IR 3D INDEPENDENT WKST  10/01/2021   IR ANGIO INTRA EXTRACRAN SEL COM CAROTID INNOMINATE UNI L MOD SED  10/01/2021   IR ANGIO INTRA EXTRACRAN SEL INTERNAL  CAROTID UNI R MOD SED  10/01/2021   IR ANGIO INTRA EXTRACRAN SEL INTERNAL CAROTID UNI R MOD SED  05/29/2022   IR ANGIO VERTEBRAL SEL VERTEBRAL BILAT MOD SED  10/01/2021   IR ANGIO VERTEBRAL SEL VERTEBRAL UNI R MOD SED  05/29/2022   IR CT HEAD LTD  10/01/2021   IR TRANSCATH/EMBOLIZ  10/01/2021   IR US GUIDE VASC ACCESS RIGHT  10/01/2021   IR US GUIDE VASC ACCESS RIGHT  05/29/2022   NOSE SURGERY     x2   POLYPECTOMY  01/04/2019   Procedure: POLYPECTOMY;  Surgeon: Lin Landsman, MD;  Location: [place];  Service: Endoscopy;;   POLYPECTOMY N/A 07/24/2021   Procedure: POLYPECTOMY;  Surgeon: Lin Landsman, MD;  Location:  [place];  Service: Endoscopy;  Laterality: N/A;   RADIOLOGY WITH ANESTHESIA N/A 10/01/2021   Procedure: Starr Sinclair WITH ANESTHESIA;  Surgeon: Pedro Earls, MD;  Location: [place];  Service: Radiology;  Laterality: N/A;   SHOULDER ARTHROSCOPY WITH OPEN ROTATOR CUFF REPAIR Left 09/20/2018   Procedure: SHOULDER ARTHROSCOPY WITH OPEN ROTATOR CUFF REPAIR;  Surgeon: Corky Mull, MD;  [facility];  Service: Orthopedics;  Laterality: Left;   SHOULDER CLOSED REDUCTION Left 12/21/2018   Procedure: MANIPULATION UNDER ANESTHESIA WITH STEROID INJECTION;  Surgeon: Corky Mull, MD;  Location: [place];  Service: Orthopedics;  Laterality: Left;  Diabetic - insulin and oral meds   UMBILICAL HERNIA REPAIR     XI ROBOTIC ASSISTED INGUINAL HERNIA REPAIR WITH MESH Left 12/16/2021   Procedure: XI ROBOTIC ASSISTED INGUINAL HERNIA REPAIR WITH MESH;  Surgeon: Olean Ree, MD;  [facility];  Service: General;  Laterality: Left;   Patient Active Problem List   Diagnosis Date Noted   History of CVA (cerebrovascular accident) 05/30/2022   Iron deficiency anemia 04/18/2022   Anemia 01/25/2022   Left inguinal hernia    Pre-op evaluation 11/26/2021   Meningioma ([place]) 11/05/2021   Inguinal hernia 10/31/2021   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/2021   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   Loss of weight    Ear fullness 05/25/2021   Skin lesion 03/15/2021   Rib pain on left side 08/05/2020   Sleep difficulties 06/08/2020   Swelling of lower extremity 11/26/2019   Healthcare maintenance 09/13/2019   Asthma 12/28/2018   Vitamin D deficiency 11/01/2018   Tendinitis of upper biceps tendon of left shoulder 09/20/2018   Depression, major, single episode, mild ([place]) 09/15/2018   Mild intermittent asthma without complication 19/37/9024   Controlled type 2 diabetes mellitus with complication, with long-term current use of  insulin ([place]) 09/15/2018   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   Traumatic incomplete tear of left rotator cuff 09/09/2018   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   Low back pain 03/29/2016   Stress 12/12/2014   Inflammatory polyps of colon ([place]) 02/16/2013   Personal history of colonic polyps 02/10/2013   Hypertension 10/06/2012   GERD (gastroesophageal reflux disease) 10/06/2012   Hypercholesterolemia 10/06/2012   Psoriatic arthritis ([place]) 06/29/2012    ONSET DATE: 05/29/22 is symptom onset. Admitted to hospital on 05/30/22.  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 Pt was in ER this past Saturday for LE swelling about a week ago.  Patient has recovered from this but has overall been struggling with moving process and some items being accidentally donated.  Pt accompanied by: self  PERTINENT HISTORY:  Pt presents to OPPT for acute CVA after cerebral angiogram. Pt reporting L sided weakness due to R pontine infarct. Independent at baseline and is back to work as in last week. 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 Currently relies on RW since CVA mainly in community. Pt denies falls since CVA. Ambulates without AD in household and office at church. Pt says weakness has improved but still has instances of dragging LLE or buckling of knee if she has been doing a lot of standing or walking.  Does report poor eccentric control descending stairs with LLE. Reports she has been living with her grand daughter and plans to move into her older daughter's house although has been independent currently with all ADL's in household and is clear to drive from [place].   PAIN:  Are you having pain? No  PRECAUTIONS: None  WEIGHT BEARING RESTRICTIONS No  FALLS: Has patient fallen in last 6 months? No  LIVING ENVIRONMENT: Lives with: lives with their family Lives in: House/apartment Stairs: Yes: External: 4 in front porch, 3 from garage steps; on right going up and can reach both Has following equipment at home: Walker - 2 wheeled  PLOF: Independent  PATIENT GOALS quit relying on RW, return to full function  OBJECTIVE:   DIAGNOSTIC FINDINGS: Stroke:  acute right paramedian pontine infarction with additional punctate foci of infarction in the right cerebellum and right > left occipital lobes. Etiology is procedural complication from recent cerebral angiogram procedure on 7/28.  MRI  Acute nonhemorrhagic 2 cm right paramedian pontine infarct, punctate foci of infarction in the right cerebellum, and right > left occipital lobes   TODAY'S TREATMENT 07/10/22 Unless otherwise stated, CGA was provided and gait belt donned in order to ensure pt safety  -STS from chair hands free 1x10 - pt rates easy, some soreness in knees, good form with this overall  -Standing marching on airex x 10 ea   -standing march on airex pad x 15 ea LE with no UE assist   Overground ambulation with head turns and dual task. X 600 feet   Airex step up and step down anterior and lateral x 10 ea   Balance obstacle course ladder, airex,x 4 1/2 bolsters   Physical therapy session had to be ended a few minutes early secondary to patient having another conflicting appointment following this.  Note: Portions of this document were prepared using Dragon voice recognition software and although reviewed may contain  unintentional dictation errors in syntax, grammar, or spelling.          PATIENT EDUCATION: Education details:Pt educated throughout session about proper posture and technique with exercises. Improved exercise technique, movement at target joints, use of target muscles after min to mod verbal, visual, tactile cues.  Person educated: Patient Education method: Customer service manager Education comprehension: verbalized understanding and returned demonstration   HOME EXERCISE PROGRAM: STS Lateral walking Walking with head turns     GOALS: Goals reviewed with patient? No  SHORT TERM GOALS: Target date: 07/08/22  Pt will be independent with HEP to improve balance, strength, and gait to return to independence with gait and ADL's Baseline: Introduced  Goal status: INITIAL  LONG TERM GOALS: Target date: 08/05/22  Pt will improve FOTO to target score to demonstrate clinically significant improvement in perceived functional mobility. Baseline: 53 with target score of 64 Goal status: INITIAL  2.  Pt will improve FGA by 4 points or greater to demonstrate reduced risk of falls with community ambulation tasks.  Baseline: 20/30 Goal status: INITIAL  3.  Pt will improve 6MWT by 164' to demonstrate clinically significant improvement in tolerance for community walking tasks at reduced risk for falls.  Baseline: 1,030' no AD Goal status: INITIAL  4.  Pt will improve 10 m gait speed without AD to >/= 1.2 m/s to demonstrate being a safe Hydrographic surveyor.  Baseline: .93 m/s without AD Goal status: INITIAL  ASSESSMENT:  CLINICAL IMPRESSION: Patient continuing to show dramatic increase in mobility with very few losses of balance this session with interventions.  Patient did have difficulty with lateral step up and off of Airex pad may continue to benefit from interventions in frontal plane balance in future sessions.  Patient did very well with obstacle course set up but did have  difficulty with appropriate step length for navigation of some obstacles.  Patient feels very confident with her progress but still has room for improvement.Pt will continue to benefit from skilled physical therapy intervention to address impairments, improve QOL, and attain therapy goals.     OBJECTIVE IMPAIRMENTS Abnormal gait, decreased balance, difficulty walking, and decreased strength.   ACTIVITY LIMITATIONS stairs and locomotion level  PARTICIPATION LIMITATIONS: community activity and occupation  PERSONAL FACTORS Age, Fitness, Past/current experiences, Time since onset of injury/illness/exacerbation, and 3+ comorbidities: Anemia, GERD, heart murmur, IBS, HTN  are also affecting patient's functional outcome.   REHAB POTENTIAL: Excellent  CLINICAL DECISION MAKING: Evolving/moderate complexity  EVALUATION COMPLEXITY: Moderate  PLAN: PT FREQUENCY: 1-2x/week  PT DURATION: 8 weeks  PLANNED INTERVENTIONS: Therapeutic exercises, Therapeutic activity, Neuromuscular re-education, Balance training, Gait training, Patient/Family education, Self Care, Stair training, and DME instructions  PLAN FOR NEXT SESSION: Continue dynamic and weighted balance tasks to improve patient's ability to navigate challenging environments.   10:21 AM, 07/10/22 Particia Lather PT  10:21 AM,07/10/22

## 2022-07-13 ENCOUNTER — Encounter: Payer: PPO | Admitting: Occupational Therapy

## 2022-07-13 ENCOUNTER — Encounter: Payer: Self-pay | Admitting: Internal Medicine

## 2022-07-13 ENCOUNTER — Ambulatory Visit: Payer: PPO

## 2022-07-13 DIAGNOSIS — R262 Difficulty in walking, not elsewhere classified: Secondary | ICD-10-CM | POA: Diagnosis not present

## 2022-07-13 DIAGNOSIS — M6281 Muscle weakness (generalized): Secondary | ICD-10-CM

## 2022-07-13 DIAGNOSIS — R278 Other lack of coordination: Secondary | ICD-10-CM

## 2022-07-13 DIAGNOSIS — R269 Unspecified abnormalities of gait and mobility: Secondary | ICD-10-CM

## 2022-07-13 NOTE — Assessment & Plan Note (Signed)
Stable.  Has been followed by rheumatology.   

## 2022-07-13 NOTE — Assessment & Plan Note (Signed)
Continue amlodipine and losartan.  Follow pressures.  Follow metabolic panel.

## 2022-07-13 NOTE — Assessment & Plan Note (Signed)
Increased stress as outlined.  Follow. Has good support.  

## 2022-07-13 NOTE — Assessment & Plan Note (Signed)
Seeing GI.  entyvio treatments for inflammatory polyposis.  Seeing hematology - iron infusions.  Follow.

## 2022-07-13 NOTE — Assessment & Plan Note (Signed)
Breathing stable.

## 2022-07-13 NOTE — Assessment & Plan Note (Signed)
Continue crestor.  Follow lipid panel and liver function tests.  

## 2022-07-13 NOTE — Assessment & Plan Note (Signed)
S/p colonoscopy 07/24/21 as outlined.  Continues f/u with GI.  Started entyvio.  Persistent rectal bleeding as outlined.  Has f/u with GI next week.  Discuss with GI regarding further treatment/intervention. Check cbc today.

## 2022-07-13 NOTE — Therapy (Signed)
OUTPATIENT PHYSICAL THERAPY NEURO TREATMENT   Patient Name: Ashley Gross MRN: [id number] DOB:[birth year], 71 y.o., female Today's Date: 07/13/2022   PCP: Einar Pheasant, MD  REFERRING PROVIDER: Einar Pheasant, MD    PT End of Session - 07/13/22 1350     Visit Number 5    Number of Visits 17    Date for PT Re-Evaluation 08/05/22    Authorization Type [employer]    PT Start Time 4098    PT Stop Time 1428    PT Time Calculation (min) 40 min    Equipment Utilized During Treatment Gait belt    Activity Tolerance Patient tolerated treatment well;No increased pain    Behavior During Therapy [facility] for tasks assessed/performed               Past Medical History:  Diagnosis Date   Abnormal liver function test 10/06/2012   Anemia, unspecified    Asthma    B12 deficiency 02/11/2019   Bleeding internal hemorrhoids 09/13/2017   Choledocholithiasis 06/29/2012   Formatting of this note might be different from the original. Dilated CBD on abdominal imaging 04/2012   Chronic diarrhea 02/16/2013   Colitis    Complication of anesthesia    asthma attack after shoulder surgery   Diabetes mellitus ([place])    type II   Esophagitis    GERD (gastroesophageal reflux disease)    Heart murmur    for years- nothing to be concerned about.   History of kidney stones    Hx of arteriovenous malformation (AVM)    Hypertension    IBS (irritable bowel syndrome)    Pernicious anemia    Personal history of colonic polyps 02/10/2013   02/08/13 colonoscopy - question of rectal varices, two 3-73m polyps in the sigmoid colon, mass at the hepatic flexure, one 52mpolyp at the hepatic flexure, nodule at the ileocecal valve, congested mucusa in the entire colon, flattened villi mucosa in the terminal ileum.     Pneumonia    Psoriatic arthritis ([place]   s/p penicillamine, plaquenil, MTX, sulfasalazine.  s/p Embrel, Humira,.  Iritis.     Pure hypercholesterolemia    Rectal bleeding  07/15/2017   [place]20[place]   per patient   Past Surgical History:  Procedure Laterality Date   ABDOMINAL HYSTERECTOMY  1981   ovaries left in place   ANKLE SURGERY     right   BUNIONECTOMY     CHOLECYSTECTOMY  1989   COLONOSCOPY WITH PROPOFOL N/A 12/27/2017   Procedure: COLONOSCOPY WITH PROPOFOL;  Surgeon: VaLin LandsmanMD;  [facility]  Service: Gastroenterology;  Laterality: N/A;   COLONOSCOPY WITH PROPOFOL N/A 01/04/2019   Procedure: COLONOSCOPY WITH PROPOFOL;  Surgeon: VaLin LandsmanMD;  [place] [facility] Service: Endoscopy;  Laterality: N/A;  Diabetic - oral and injectable   COLONOSCOPY WITH PROPOFOL N/A 07/24/2021   Procedure: COLONOSCOPY WITH PROPOFOL;  Surgeon: VaLin LandsmanMD;  [place] [facility] Service: Endoscopy;  Laterality: N/A;  Diabetic   ESOPHAGOGASTRODUODENOSCOPY (EGD) WITH PROPOFOL N/A 12/27/2017   Procedure: ESOPHAGOGASTRODUODENOSCOPY (EGD) WITH PROPOFOL;  Surgeon: VaLin LandsmanMD;  [facility];  Service: Gastroenterology;  Laterality: N/A;   HAND SURGERY     right   HEEL SPUR SURGERY     IR 3D INDEPENDENT WKST  10/01/2021   IR ANGIO INTRA EXTRACRAN SEL COM CAROTID INNOMINATE UNI L MOD SED  10/01/2021   IR ANGIO INTRA EXTRACRAN SEL INTERNAL  CAROTID UNI R MOD SED  10/01/2021   IR ANGIO INTRA EXTRACRAN SEL INTERNAL CAROTID UNI R MOD SED  05/29/2022   IR ANGIO VERTEBRAL SEL VERTEBRAL BILAT MOD SED  10/01/2021   IR ANGIO VERTEBRAL SEL VERTEBRAL UNI R MOD SED  05/29/2022   IR CT HEAD LTD  10/01/2021   IR TRANSCATH/EMBOLIZ  10/01/2021   IR US GUIDE VASC ACCESS RIGHT  10/01/2021   IR US GUIDE VASC ACCESS RIGHT  05/29/2022   NOSE SURGERY     x2   POLYPECTOMY  01/04/2019   Procedure: POLYPECTOMY;  Surgeon: Lin Landsman, MD;  Location: [place];  Service: Endoscopy;;   POLYPECTOMY N/A 07/24/2021   Procedure: POLYPECTOMY;  Surgeon: Lin Landsman, MD;  Location:  [place];  Service: Endoscopy;  Laterality: N/A;   RADIOLOGY WITH ANESTHESIA N/A 10/01/2021   Procedure: Starr Sinclair WITH ANESTHESIA;  Surgeon: Pedro Earls, MD;  Location: [place];  Service: Radiology;  Laterality: N/A;   SHOULDER ARTHROSCOPY WITH OPEN ROTATOR CUFF REPAIR Left 09/20/2018   Procedure: SHOULDER ARTHROSCOPY WITH OPEN ROTATOR CUFF REPAIR;  Surgeon: Corky Mull, MD;  Location: [facility];  Service: Orthopedics;  Laterality: Left;   SHOULDER CLOSED REDUCTION Left 12/21/2018   Procedure: MANIPULATION UNDER ANESTHESIA WITH STEROID INJECTION;  Surgeon: Corky Mull, MD;  Location: [place];  Service: Orthopedics;  Laterality: Left;  Diabetic - insulin and oral meds   UMBILICAL HERNIA REPAIR     XI ROBOTIC ASSISTED INGUINAL HERNIA REPAIR WITH MESH Left 12/16/2021   Procedure: XI ROBOTIC ASSISTED INGUINAL HERNIA REPAIR WITH MESH;  Surgeon: Olean Ree, MD;  Location: [facility];  Service: General;  Laterality: Left;   Patient Active Problem List   Diagnosis Date Noted   History of CVA (cerebrovascular accident) 05/30/2022   Iron deficiency anemia 04/18/2022   Anemia 01/25/2022   Left inguinal hernia    Pre-op evaluation 11/26/2021   Meningioma ([place]) 11/05/2021   Inguinal hernia 10/31/2021   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[place]) 10/31/2021   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   Loss of weight    Ear fullness 05/25/2021   Skin lesion 03/15/2021   Rib pain on left side 08/05/2020   Sleep difficulties 06/08/2020   Swelling of lower extremity 11/26/2019   Healthcare maintenance 09/13/2019   Asthma 12/28/2018   Vitamin D deficiency 11/01/2018   Tendinitis of upper biceps tendon of left shoulder 09/20/2018   Depression, major, single episode, mild ([place]) 09/15/2018   Mild intermittent asthma without complication 61/60/7371   Controlled type 2 diabetes mellitus with complication, with long-term current use of  insulin ([place]) 09/15/2018   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   Traumatic incomplete tear of left rotator cuff 09/09/2018   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   Low back pain 03/29/2016   Stress 12/12/2014   Inflammatory polyps of colon ([place]) 02/16/2013   Personal history of colonic polyps 02/10/2013   Hypertension 10/06/2012   GERD (gastroesophageal reflux disease) 10/06/2012   Hypercholesterolemia 10/06/2012   Psoriatic arthritis ([place]) 06/29/2012    ONSET DATE: 05/29/22 is symptom onset. Admitted to hospital on 05/30/22.  REFERRING DIAG: I63.49 (ICD-10-CM) - Cerebrovascular accident (CVA) due to embolism of other cerebral artery (HCC) I67.1 (ICD-10-CM) - Brain aneurysm   THERAPY DIAG:  Muscle weakness (generalized)  Abnormality of gait and mobility  Difficulty in walking, not elsewhere classified  Other lack of coordination  Rationale for Evaluation and Treatment Rehabilitation  SUBJECTIVE:  SUBJECTIVE STATEMENT: Pt denies falls and continues to do well. No concerns or questions or medical updates.  Pt accompanied by: self  PERTINENT HISTORY:  Pt presents to OPPT for acute CVA after cerebral angiogram. Pt reporting L sided weakness due to R pontine infarct. Independent at baseline and is back to work as in last week. 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 Currently relies on RW since CVA mainly in community. Pt denies falls since CVA. Ambulates without AD in household and office at church. Pt says weakness has improved but still has instances of dragging LLE or buckling of knee if she has been doing a lot of standing or walking. Does report poor eccentric control descending stairs with LLE. Reports she has been living with her grand daughter and plans to move into her older daughter's  house although has been independent currently with all ADL's in household and is clear to drive from [place].   PAIN:  Are you having pain? No  PRECAUTIONS: None  WEIGHT BEARING RESTRICTIONS No  FALLS: Has patient fallen in last 6 months? No  LIVING ENVIRONMENT: Lives with: lives with their family Lives in: House/apartment Stairs: Yes: External: 4 in front porch, 3 from garage steps; on right going up and can reach both Has following equipment at home: Walker - 2 wheeled  PLOF: Independent  PATIENT GOALS quit relying on RW, return to full function  OBJECTIVE:   DIAGNOSTIC FINDINGS: Stroke:  acute right paramedian pontine infarction with additional punctate foci of infarction in the right cerebellum and right > left occipital lobes. Etiology is procedural complication from recent cerebral angiogram procedure on 7/28.  MRI  Acute nonhemorrhagic 2 cm right paramedian pontine infarct, punctate foci of infarction in the right cerebellum, and right > left occipital lobes   TODAY'S TREATMENT 07/13/22 Unless otherwise stated, CGA was provided and gait belt donned in order to ensure pt safety   STS with feet on airex pad: 1x10. 1x10 with 1 KG med ball overhead. SBA  Lateral mini lunges onto airex pad/BOSU ball side up. 2x10/LE.   R/L side steps up and over BOSU ball at balance bar. x12/LE. Light 2 finger support  Side stepping on airex pad, over orange hurdle, onto another airex pad: x12/LE. Light 2 finger support  Dual tasking:   Head turns (vertical/horizontal) 3x60'   Walking around gym 160' with ball tosses to self   Walking around gym 160' with CW/CCW ball passes to therapist.    Backwards walking: 2x60'         PATIENT EDUCATION: Education details:Pt educated throughout session about proper posture and technique with exercises. Improved exercise technique, movement at target joints, use of target muscles after min to mod verbal, visual, tactile cues.  Person educated:  Patient Education method: Customer service manager Education comprehension: verbalized understanding and returned demonstration   HOME EXERCISE PROGRAM: STS Lateral walking Walking with head turns     GOALS: Goals reviewed with patient? No  SHORT TERM GOALS: Target date: 07/08/22  Pt will be independent with HEP to improve balance, strength, and gait to return to independence with gait and ADL's Baseline: Introduced Goal status: INITIAL  LONG TERM GOALS: Target date: 08/05/22  Pt will improve FOTO to target score to demonstrate clinically significant improvement in perceived functional mobility. Baseline: 53 with target score of 64 Goal status: INITIAL  2.  Pt will improve FGA by 4 points or greater to demonstrate reduced risk of falls with community ambulation tasks.  Baseline: 20/30  Goal status: INITIAL  3.  Pt will improve 6MWT by 164' to demonstrate clinically significant improvement in tolerance for community walking tasks at reduced risk for falls.  Baseline: 1,030' no AD Goal status: INITIAL  4.  Pt will improve 10 m gait speed without AD to >/= 1.2 m/s to demonstrate being a safe Hydrographic surveyor.  Baseline: .93 m/s without AD Goal status: INITIAL  ASSESSMENT:  CLINICAL IMPRESSION: Pt demonstrating excellent motivation throughout session. Pt demonstrates most difficulty with frontal plane balance tasks requiring at least 2 finger support with BOSU ball with challenge to ankle stability. Pt does also have mild balance impairments with dual tasking primarily with horizontal head turns requiring intermittent stepping strategy to recover. Pt will continue to benefit from skilled PT services to improve dynamic balance to reduce risk of falls.     OBJECTIVE IMPAIRMENTS Abnormal gait, decreased balance, difficulty walking, and decreased strength.   ACTIVITY LIMITATIONS stairs and locomotion level  PARTICIPATION LIMITATIONS: community activity and  occupation  PERSONAL FACTORS Age, Fitness, Past/current experiences, Time since onset of injury/illness/exacerbation, and 3+ comorbidities: Anemia, GERD, heart murmur, IBS, HTN  are also affecting patient's functional outcome.   REHAB POTENTIAL: Excellent  CLINICAL DECISION MAKING: Evolving/moderate complexity  EVALUATION COMPLEXITY: Moderate  PLAN: PT FREQUENCY: 1-2x/week  PT DURATION: 8 weeks  PLANNED INTERVENTIONS: Therapeutic exercises, Therapeutic activity, Neuromuscular re-education, Balance training, Gait training, Patient/Family education, Self Care, Stair training, and DME instructions  PLAN FOR NEXT SESSION: Update HEP   Salem Caster. Fairly IV, PT, DPT Physical Therapist- [facility]  2:56 PM,07/13/22

## 2022-07-13 NOTE — Assessment & Plan Note (Signed)
On insulin.  Follow sugars. Follow met b and a1c

## 2022-07-13 NOTE — Assessment & Plan Note (Signed)
Increased swelling as outlined.  Prescribed lasix.  Has taken for the last several days.  Swelling has improved.  Hold scheduled lasix.  Check metabolic panel.  Discussed weighing daily.  Had ECHO 05/2022 - EF 65% with grade I DD.

## 2022-07-13 NOTE — Assessment & Plan Note (Signed)
Continue omeprazole 

## 2022-07-13 NOTE — Assessment & Plan Note (Signed)
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  MRA head and neck were negative.  ECHO - no evidence of embolic source. With history of significant GI bleed in the past due to AVM, polyp bleed, still requiring intermittent IV transfusion, so at this point she was not started on antiplatelet therapy.

## 2022-07-13 NOTE — Assessment & Plan Note (Signed)
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  Underwent diagnostic cerebral angiogram with no recurrent aneurysm.  Recommended one year MRI angiogram - follow up.

## 2022-07-13 NOTE — Assessment & Plan Note (Signed)
Increased stress with the move and underlying medical issues.  Discussed.  Has good support.  Follow.

## 2022-07-15 ENCOUNTER — Ambulatory Visit: Payer: PPO | Admitting: Gastroenterology

## 2022-07-15 ENCOUNTER — Telehealth: Payer: Self-pay

## 2022-07-15 ENCOUNTER — Encounter: Payer: Self-pay | Admitting: Gastroenterology

## 2022-07-15 ENCOUNTER — Ambulatory Visit: Payer: PPO | Admitting: Physical Therapy

## 2022-07-15 VITALS — BP 146/83 | HR 93 | Temp 98.1°F | Ht 64.0 in | Wt 121.1 lb

## 2022-07-15 DIAGNOSIS — K51419 Inflammatory polyps of colon with unspecified complications: Secondary | ICD-10-CM | POA: Diagnosis not present

## 2022-07-15 DIAGNOSIS — D500 Iron deficiency anemia secondary to blood loss (chronic): Secondary | ICD-10-CM | POA: Diagnosis not present

## 2022-07-15 DIAGNOSIS — K8681 Exocrine pancreatic insufficiency: Secondary | ICD-10-CM

## 2022-07-15 DIAGNOSIS — D519 Vitamin B12 deficiency anemia, unspecified: Secondary | ICD-10-CM

## 2022-07-15 MED ORDER — CREON 36000-114000 UNITS PO CPEP: 36000.0000 [IU] | ORAL_CAPSULE | ORAL | 3 refills | Status: AC

## 2022-07-15 NOTE — Telephone Encounter (Signed)
Called the patient assistance for patient Ashley Gross. Updated patient address with them and they refilled the medication  and will ship the medication to her. It will be shipped to her and will process it today.  Next refill 08/09/2022 order number is [id number]. Called and informed patient of this information

## 2022-07-15 NOTE — Progress Notes (Signed)
Ashley Darby, MD [address]  [place]  [place], [place] [address]  Main: [phone]  Fax: [phone]    Gastroenterology Consultation  Referring Provider:     Einar Pheasant, MD Primary Care Physician:  Einar Pheasant, MD Primary Gastroenterologist:  Dr. Cephas Gross Reason for Consultation:   Unexplained weight loss, loose stools, inflammatory polyposis of the colon        HPI:   Ashley Gross is a 71 y.o. white female referred by Dr. Einar Pheasant, MD  for consultation & management of ongoing weight loss, recurrence of iron deficiency anemia.  Patient does have history of inflammatory polyposis of the colon with no evidence of underlying IBD since 2014, chronic iron deficiency anemia as well as exocrine pancreatic insufficiency.  Patient has been referred back to me due to worsening of diarrhea with increased rectal bleeding and recurrence of severe iron deficiency anemia.  Her hemoglobin has been stable around 10.5 to 5 months ago.  Decreased to 9.5 on 02/20/2022 and 8.7 on 6/5.  Serum ferritin levels were 8.8.  Therefore, Dr. Nicki Reaper has made an urgent referral.  Patient states that she has been taking Creon 36K 3 times a day with each meal.  She also reports intermittent rectal bleeding, ongoing weight loss.  She is currently off Trulicity, hemoglobin G9Q is normal.  Patient is currently taking oral iron supplements only.  With regards to rectal bleeding and inflammatory polyposis, patient for colonoscopy in September 2022 and large polyps were removed which helped in improvement of anemia.  However, rectal bleeding has recurred lately.  During that time, I discussed with patient regarding the role of Entyvio which has been reported in case series for management of inflammatory polyposis.  However, patient did not receive Entyvio infusion.  Follow-up visit 07/15/2022 Patient is here for follow-up of inflammatory polyposis of the colon and weight loss, chronic  diarrhea.  She is started [facility], received 3 induction doses and has been tolerating the infusions well.  She has iron deficiency anemia from chronic blood loss secondary to inflammatory polyposis, received parenteral iron therapy, iron deficiency has improved.  She does have severe B12 deficiency, received B12 injection last month.  She stopped taking Creon because she could not get the medication refilled.  She felt Creon was helping to reduce diarrhea frequency.  She is taking Imodium twice daily, continues to have 4-5 mushy bowel movements daily mixed with blood.  She gained about 12 pounds since last visit.  She is now moved to live with her children along with her husband, has been eating well.  GI Procedures:  Colonoscopy 07/24/2021 - The examined portion of the ileum was normal. - Three 5 to 7 mm polyps in the transverse colon, in the ascending colon and in the cecum, removed with a cold snare. Resected and retrieved. - Normal mucosa in the entire examined colon. Biopsied. - Polyposis at the recto-sigmoid colon, in the sigmoid colon and in the ascending colon. DIAGNOSIS:  A.  COLON POLYP, CECUM; COLD SNARE:  - INFLAMMATORY POLYP.  - NEGATIVE FOR DYSPLASIA AND MALIGNANCY.   B.  COLON POLYP, ASCENDING; COLD SNARE:  - INFLAMMATORY POLYP.  - NEGATIVE FOR DYSPLASIA AND MALIGNANCY.   C.  COLON; RANDOM COLD BIOPSY:  - COLONIC MUCOSA WITH INTACT CRYPT ARCHITECTURE.  - NEGATIVE FOR MICROSCOPIC COLITIS, DYSPLASIA, AND MALIGNANCY.   D.  COLON POLYP, TRANSVERSE; COLD SNARE:  - FECAL FRAGMENTS; NO TISSUE IS IDENTIFIED.   Colonoscopy 01/04/2019 -  Hemorrhoids found on perianal exam. - The examined portion of the ileum was normal. - Multiple 10 to 30 mm, recently bleeding polyps in the sigmoid colon and in the descending colon, removed with a hot snare. Polyp resection was incomplete, and the resected tissue was partially retrieved.     Colonoscopy 04/06/2007 Indications:  last colonoscopy  2001, anemia. Impression: -Congested mucosa in the entire colon. -Nodules in the proximal ascending colon, biopsied. -Nodule in the distal ascending colon, biopsied. -Congested erythematous and friable (with contact bleeding) mucosa in the  ascending colon and in the cecum. . "CECUM" (ENDOSCOPIC BIOPSY)     COLONIC MUCOSA WITH NO PATHOLOGIC DIAGNOSIS.     NO ACTIVE OR CHRONIC COLITIS IS SEEN.     NO EVIDENCE OF LYMPHOCYTIC OR COLLAGENOUS COLITIS IS SEEN. B. "RIGHT COLON NODULES" (ENDOSCOPIC BIOPSY):     INFLAMMATORY POLYPS. C. "RECTOSIGMOID COLON" (ENDOSCOPIC BIOPSY):     COLONIC MUCOSA WITH NO PATHOLOGIC DIAGNOSIS.     NO ACTIVE OR CHRONIC COLITIS IS SEEN.     NO EVIDENCE OF LYMPHOCYTIC OR COLLAGENOUS COLITIS IS SEEN. D. "DISTAL ASCENDING COLON" (ENDOSCOPIC BIOPSY):     INFLAMMATORY POLYPS. ------------------------------------------------------------------------------------------------- Colonoscopy 2014  A. "ILEUM" (ENDOSCOPIC BIOPSY):     SMALL INTESTINAL MUCOSA WITH NO PATHOLOGIC DIAGNOSIS.     NO ACTIVE OR CHRONIC ENTERITIS IS SEEN.     NO GRANULOMATOUS INFLAMMATION IS SEEN. B. "COLON, ILEOCECAL VALVE NODULE" (BIOPSY):     COLONIC MUCOSA, WITH REACTIVE CHANGES.     NO ADENOMATOUS MUCOSA IS IDENTIFIED. C. "RIGHT COLON" (ENDOSCOPIC BIOPSY):     COLONIC MUCOSA WITH NO PATHOLOGIC DIAGNOSIS.     NO ACTIVE OR CHRONIC COLITIS IS SEEN.     NO EVIDENCE OF LYMPHOCYTIC OR COLLAGENOUS COLITIS IS SEEN. D. "HEPATIC FLEXURE MASS" (ENDOSCOPIC BIOPSY):     INFLAMMATORY POLYP.     NO ADENOMATOUS MUCOSA IS SEEN. E. "TRANSVERSE COLON" (ENDOSCOPIC BIOPSY):     COLONIC MUCOSA WITH NO PATHOLOGIC DIAGNOSIS.     NO ACTIVE OR CHRONIC COLITIS IS SEEN.     NO EVIDENCE OF LYMPHOCYTIC OR COLLAGENOUS COLITIS IS SEEN. F. "LEFT COLON" (ENDOSCOPIC BIOPSY):     COLONIC MUCOSA WITH NO PATHOLOGIC DIAGNOSIS.     NO ACTIVE OR CHRONIC COLITIS IS SEEN.     NO EVIDENCE OF LYMPHOCYTIC OR COLLAGENOUS COLITIS IS  SEEN.     A MINUTE HYPERPLASTIC POLYP IS ALSO PRESENT. G. "LEFT COLON POLYP(S)" (ENDOSCOPIC POLYPECTOMY):     TUBULAR ADENOMA.     NO HIGH GRADE DYSPLASIA OR CARCINOMA IS SEEN.  Colonoscopy 12/27/2017 - The examined portion of the ileum was normal. Biopsied. - One 12 mm polyp in the ascending colon, removed with a hot snare. Resected and retrieved. - One 40 mm polyp in the transverse colon, removed with a hot snare. Incomplete resection. Resected tissue retrieved. - A few 8 to 15 mm, non-bleeding polyps in the sigmoid colon. Resection not attempted. - The distal rectum and anal verge are normal on retroflexion view.   Upper endoscopy 12/27/2017 - Normal duodenal bulb and second portion of the duodenum. Biopsied. - Erythematous mucosa in the gastric body and antrum. - Normal cardia, gastric fundus and incisura. Biopsied. - Normal gastroesophageal junction and esophagus.  DIAGNOSIS:  A. DUODENUM; COLD BIOPSY:  - DUODENAL MUCOSA WITH PRESERVED VILLOUS ARCHITECTURE AND BRUNNER'S  GLAND HYPERPLASIA.  - NEGATIVE FOR INTRA-EPITHELIAL LYMPHOCYTOSIS, DYSPLASIA AND MALIGNANCY.   B.  STOMACH; COLD BIOPSY:  - ANTRAL AND OXYNTIC MUCOSA WITH MILD CHRONIC GASTRITIS.  -  NEGATIVE FOR H. PYLORI, DYSPLASIA AND MALIGNANCY.   C.  TERMINAL ILEUM; COLD BIOPSY:  - SMALL BOWEL MUCOSA WITH INTACT VILLOUS ARCHITECTURE.  - NEGATIVE FOR INTRAEPITHELIAL LYMPHOCYTOSIS, DYSPLASIA AND MALIGNANCY.   D.  RANDOM RIGHT COLON; COLD BIOPSY:  - COLONIC MUCOSA NEGATIVE FOR MICROSCOPIC COLITIS, DYSPLASIA AND  MALIGNANCY.    E.  COLON POLYP, ASCENDING; HOT SNARE:  - INFLAMMATORY-TYPE POLYP.   F.  COLON POLYP, TRANSVERSE; HOT SNARE:  - INFLAMMATORY-TYPE POLYP.   G.  RANDOM TRANSVERSE COLON; COLD BIOPSY:  - COLONIC MUCOSA NEGATIVE FOR MICROSCOPIC COLITIS, DYSPLASIA AND  MALIGNANCY.   Past Medical History:  Diagnosis Date   Abnormal liver function test 10/06/2012   Anemia, unspecified    Asthma    B12  deficiency 02/11/2019   Bleeding internal hemorrhoids 09/13/2017   Choledocholithiasis 06/29/2012   Formatting of this note might be different from the original. Dilated CBD on abdominal imaging 04/2012   Chronic diarrhea 02/16/2013   Colitis    Complication of anesthesia    asthma attack after shoulder surgery   Diabetes mellitus ([place])    type II   Esophagitis    GERD (gastroesophageal reflux disease)    Heart murmur    for years- nothing to be concerned about.   History of kidney stones    Hx of arteriovenous malformation (AVM)    Hypertension    IBS (irritable bowel syndrome)    Pernicious anemia    Personal history of colonic polyps 02/10/2013   02/08/13 colonoscopy - question of rectal varices, two 3-32m polyps in the sigmoid colon, mass at the hepatic flexure, one 524mpolyp at the hepatic flexure, nodule at the ileocecal valve, congested mucusa in the entire colon, flattened villi mucosa in the terminal ileum.     Pneumonia    Psoriatic [place] ([place]   s/p penicillamine, plaquenil, MTX, sulfasalazine.  s/p Embrel, Humira,.  Iritis.     Pure hypercholesterolemia    Rectal bleeding 07/15/2017   St[place] ([place]20[place]   per patient    Past Surgical History:  Procedure Laterality Date   ABDOMINAL HYSTERECTOMY  1981   ovaries left in place   ANKLE SURGERY     right   BUNIONECTOMY     CHOLECYSTECTOMY  1989   COLONOSCOPY WITH PROPOFOL N/A 12/27/2017   Procedure: COLONOSCOPY WITH PROPOFOL;  Surgeon: VaLin LandsmanMD;  [facility];  Service: Gastroenterology;  Laterality: N/A;   COLONOSCOPY WITH PROPOFOL N/A 01/04/2019   Procedure: COLONOSCOPY WITH PROPOFOL;  Surgeon: VaLin LandsmanMD;  [place] Service: Endoscopy;  Laterality: N/A;  Diabetic - oral and injectable   COLONOSCOPY WITH PROPOFOL N/A 07/24/2021   Procedure: COLONOSCOPY WITH PROPOFOL;  Surgeon: VaLin LandsmanMD;  [place] Service:  Endoscopy;  Laterality: N/A;  Diabetic   ESOPHAGOGASTRODUODENOSCOPY (EGD) WITH PROPOFOL N/A 12/27/2017   Procedure: ESOPHAGOGASTRODUODENOSCOPY (EGD) WITH PROPOFOL;  Surgeon: VaLin LandsmanMD;  [facility];  Service: Gastroenterology;  Laterality: N/A;   HAND SURGERY     right   HEEL SPUR SURGERY     IR 3D INDEPENDENT WKST  10/01/2021   IR ANGIO INTRA EXTRACRAN SEL COM CAROTID INNOMINATE UNI L MOD SED  10/01/2021   IR ANGIO INTRA EXTRACRAN SEL INTERNAL CAROTID UNI R MOD SED  10/01/2021   IR ANGIO INTRA EXTRACRAN SEL INTERNAL CAROTID UNI R MOD SED  05/29/2022   IR ANGIO VERTEBRAL SEL VERTEBRAL BILAT MOD SED  10/01/2021  IR ANGIO VERTEBRAL SEL VERTEBRAL UNI R MOD SED  05/29/2022   IR CT HEAD LTD  10/01/2021   IR TRANSCATH/EMBOLIZ  10/01/2021   IR US GUIDE VASC ACCESS RIGHT  10/01/2021   IR US GUIDE VASC ACCESS RIGHT  05/29/2022   NOSE SURGERY     x2   POLYPECTOMY  01/04/2019   Procedure: POLYPECTOMY;  Surgeon: Lin Landsman, MD;  Location: [place];  Service: Endoscopy;;   POLYPECTOMY N/A 07/24/2021   Procedure: POLYPECTOMY;  Surgeon: Lin Landsman, MD;  Location: [place];  Service: Endoscopy;  Laterality: N/A;   RADIOLOGY WITH ANESTHESIA N/A 10/01/2021   Procedure: Starr Sinclair WITH ANESTHESIA;  Surgeon: Pedro Earls, MD;  Location: [place];  Service: Radiology;  Laterality: N/A;   SHOULDER ARTHROSCOPY WITH OPEN ROTATOR CUFF REPAIR Left 09/20/2018   Procedure: SHOULDER ARTHROSCOPY WITH OPEN ROTATOR CUFF REPAIR;  Surgeon: Corky Mull, MD;  Location: [facility];  Service: Orthopedics;  Laterality: Left;   SHOULDER CLOSED REDUCTION Left 12/21/2018   Procedure: MANIPULATION UNDER ANESTHESIA WITH STEROID INJECTION;  Surgeon: Corky Mull, MD;  Location: [place];  Service: Orthopedics;  Laterality: Left;  Diabetic - insulin and oral meds   UMBILICAL HERNIA REPAIR     XI ROBOTIC ASSISTED INGUINAL HERNIA REPAIR WITH MESH  Left 12/16/2021   Procedure: XI ROBOTIC ASSISTED INGUINAL HERNIA REPAIR WITH MESH;  Surgeon: Olean Ree, MD;  Location: [facility];  Service: General;  Laterality: Left;     Current Outpatient Medications:    acetaminophen (TYLENOL) 500 MG tablet, Take 2 tablets (1,000 mg total) by mouth every 6 (six) hours as needed for mild pain., Disp: , Rfl:    albuterol (PROVENTIL) (2.5 MG/3ML) 0.083% nebulizer solution, Take 3 mLs (2.5 mg total) by nebulization every 4 (four) hours as needed for wheezing or shortness of breath., Disp: 360 mL, Rfl: 12   albuterol (VENTOLIN HFA) 108 (90 Base) MCG/ACT inhaler, Inhale 2 puffs into the lungs every 6 (six) hours as needed for wheezing or shortness of breath., Disp: , Rfl:    amLODipine (NORVASC) 5 MG tablet, Take 1 tablet (5 mg total) by mouth in the morning and at bedtime., Disp: 180 tablet, Rfl: 1   budesonide-formoterol (SYMBICORT) 160-4.5 MCG/ACT inhaler, Inhale 2 puffs into the lungs 2 (two) times daily. (Patient taking differently: Inhale 2 puffs into the lungs 2 (two) times daily as needed (shortness of breath).), Disp: 3 each, Rfl: 3   Cholecalciferol (VITAMIN D) 50 MCG (2000 UT) tablet, Take 2,000 Units by mouth daily., Disp: , Rfl:    diphenhydramine-acetaminophen (TYLENOL PM) 25-500 MG TABS tablet, Take 2 tablets by mouth at bedtime as needed (sleep)., Disp: , Rfl:    fluticasone (FLOVENT HFA) 110 MCG/ACT inhaler, Inhale 2 puffs into the lungs 2 (two) times daily as needed (shortness of breath)., Disp: , Rfl:    gabapentin (NEURONTIN) 100 MG capsule, Take 400 mg by mouth at bedtime., Disp: , Rfl:    insulin degludec (TRESIBA FLEXTOUCH) 100 UNIT/ML FlexTouch Pen, Inject 14 Units into the skin daily. (Patient taking differently: Inject 12 Units into the skin daily with supper.), Disp: 12 mL, Rfl: 2   loperamide (IMODIUM) 2 MG capsule, Take 4 mg by mouth daily., Disp: , Rfl:    losartan (COZAAR) 100 MG tablet, TAKE 1 TABLET(100 MG) BY MOUTH AT BEDTIME, Disp:  90 tablet, Rfl: 1   Melatonin 10 MG TABS, Take 10 mg by mouth at bedtime., Disp: , Rfl:  omeprazole (PRILOSEC) 20 MG capsule, TAKE 1 CAPSULE(20 MG) BY MOUTH TWICE DAILY, Disp: 180 capsule, Rfl: 3   ONE TOUCH ULTRA TEST test strip, PATIENT NEEDS NEW METER STRIPS AND LANCETS FOR ONE TOUCH. HER INSURANCE NO LONGER COVERS ACCUCHEK., Disp: 100 each, Rfl: PRN   potassium chloride (KLOR-CON M) 10 MEQ tablet, Take 1 tablet (10 mEq total) by mouth 2 (two) times daily., Disp: 30 tablet, Rfl: 0   rosuvastatin (CRESTOR) 10 MG tablet, TAKE 1 TABLET(10 MG) BY MOUTH DAILY, Disp: 90 tablet, Rfl: 3   CREON 36000-114000 units CPEP capsule, Take 1-2 capsules (36,000-72,000 Units total) by mouth See admin instructions. Take 720000 units with each meal and 36000 units with snacks, Disp: 240 capsule, Rfl: 3   Family History  Problem Relation Age of Onset   Diabetes Other    Hypertension Other    Breast cancer Neg Hx    Colon cancer Neg Hx      Social History   Tobacco Use   Smoking status: Never   Smokeless tobacco: Never  Vaping Use   Vaping Use: Never used  Substance Use Topics   Alcohol use: Never   Drug use: Never    Allergies as of 07/15/2022 - Review Complete 07/15/2022  Allergen Reaction Noted   Aspirin Other (See Comments) 10/06/2012   Beta adrenergic blockers  10/06/2012   Lisinopril Cough 12/21/2018   Mtx support [cobalamine combinations] Other (See Comments) 10/06/2012   Vasotec [enalapril] Cough 10/06/2012   Verapamil Other (See Comments) 10/06/2012   Voltaren [diclofenac sodium]  10/06/2012   Erythromycin Other (See Comments) 10/06/2012   Indocin [indomethacin]  10/06/2012   Jardiance [empagliflozin] Other (See Comments) 02/26/2020    Review of Systems:    All systems reviewed and negative except where noted in HPI.   Physical Exam:  BP (!) 146/83 (BP Location: Left Arm, Patient Position: Sitting, Cuff Size: Normal)   Pulse 93   Temp 98.1 F (36.7 C) (Oral)   Ht '5\' 4"'$   (1.626 m)   Wt 121 lb 2 oz (54.9 kg)   BMI 20.79 kg/m  No LMP recorded. Patient has had a hysterectomy.  General:   Alert, moderately built, moderately nourished, pleasant and cooperative in NAD Head:  Normocephalic and atraumatic. Eyes:  Sclera clear, no icterus.   Conjunctiva pink. Ears:  Normal auditory acuity. Nose:  No deformity, discharge, or lesions. Mouth:  No deformity or lesions,oropharynx pink & moist. Neck:  Supple; no masses or thyromegaly. Lungs:  Respirations even and unlabored.  Clear throughout to auscultation.   No wheezes, crackles, or rhonchi. No acute distress. Heart:  Regular rate and rhythm; no murmurs, clicks, rubs, or gallops. Abdomen:  Normal bowel sounds.  Obese, Soft, non-tender distended, no hepatosplenomegaly.  No guarding or rebound tenderness.   Rectal: Not done today Msk:  Symmetrical without gross deformities. Good, equal movement & strength bilaterally. Pulses:  Normal pulses noted. Extremities:  No clubbing or edema.  No cyanosis. Neurologic:  Alert and oriented x3;  grossly normal neurologically. Skin:  Intact without significant lesions or rashes. No jaundice. Psych:  Alert and cooperative. Normal mood and affect.  Imaging Studies: Reviewed  Assessment and Plan:   DIVINE IMBER is a 71 y.o. white female with h/o psoriatic arthritis, chronic diarrhea, inflammatory polyps in colon presents for f/u of rectal bleeding and chronic diarrhea. She underwent ligation of RA, RP. LL internal hemorrhoids. Had extensive workup in the past which is negative for gastrinoma, IBD, microscopic colitis, celiac disease,  chronic infections, EPI.  Rectal bleeding had resolved after resection of large inflammatory polyps.  Patient was no longer iron deficient.  Patient now returns with recurrence of rectal bleeding as well as severe iron deficiency anemia  Inflammatory polyposis of the colon are inflammatory Polyposis is a rare phenomenon which can lead to rectal  bleeding and chronic diarrhea.  This may or may not be associated with inflammatory bowel disease.  Her colon biopsies were negative for inflammatory bowel disease.  Few case reports have been mentioned in the literature that in addition to removal of the large polyps, there might be a role for immunosuppressive therapy such as Biologics.  Today, I have discussed regarding trial of vedolizumab due to recurrence of rectal bleeding and iron deficiency anemia.  Patient is currently on Entyvio, tolerating well  Unexplained weight loss and severe exocrine pancreatic insufficiency: Weight loss has improved Fecal elastase level 64 in 07/2021 Continue Creon or Zenpep 2 capsules with each meal and 1 with snack, based on her weight CT abdomen and pelvis with contrast revealed diffuse edema of the large bowel secondary to inflammatory polyposis Encouraged high-protein diet, protein supplements and high calorie diet encouraged her to maintain weight log.   Iron and B12 deficiency anemia Continue to follow-up with hematology for parenteral iron therapy as needed Over-the-counter B12 supplements 500 to 1,000 mcg daily   Follow up in 3 -4 months   Ashley Darby, MD

## 2022-07-17 ENCOUNTER — Ambulatory Visit: Payer: PPO | Admitting: Physical Therapy

## 2022-07-17 ENCOUNTER — Other Ambulatory Visit (INDEPENDENT_AMBULATORY_CARE_PROVIDER_SITE_OTHER): Payer: PPO

## 2022-07-17 DIAGNOSIS — E876 Hypokalemia: Secondary | ICD-10-CM | POA: Diagnosis not present

## 2022-07-17 LAB — BASIC METABOLIC PANEL
BUN: 15 mg/dL (ref 6–23)
CO2: 28 mEq/L (ref 19–32)
Calcium: 8.3 mg/dL — ABNORMAL LOW (ref 8.4–10.5)
Chloride: 105 mEq/L (ref 96–112)
Creatinine, Ser: 0.92 mg/dL (ref 0.40–1.20)
GFR: 62.66 mL/min (ref >60.00)
Glucose, Bld: 266 mg/dL — ABNORMAL HIGH (ref 70–99)
Potassium: 4 mEq/L (ref 3.5–5.1)
Sodium: 140 mEq/L (ref 135–145)

## 2022-07-20 ENCOUNTER — Telehealth: Payer: Self-pay

## 2022-07-20 ENCOUNTER — Ambulatory Visit: Payer: PPO

## 2022-07-20 DIAGNOSIS — E876 Hypokalemia: Secondary | ICD-10-CM

## 2022-07-20 DIAGNOSIS — R262 Difficulty in walking, not elsewhere classified: Secondary | ICD-10-CM

## 2022-07-20 DIAGNOSIS — R269 Unspecified abnormalities of gait and mobility: Secondary | ICD-10-CM

## 2022-07-20 DIAGNOSIS — M6281 Muscle weakness (generalized): Secondary | ICD-10-CM

## 2022-07-20 DIAGNOSIS — R278 Other lack of coordination: Secondary | ICD-10-CM

## 2022-07-20 NOTE — Therapy (Signed)
OUTPATIENT PHYSICAL THERAPY NEURO TREATMENT   Patient Name: Ashley Gross MRN: [id number] DOB:Aug 01, 1951, 71 y.o., female Today's Date: 07/20/2022   PCP: Einar Pheasant, MD  REFERRING PROVIDER: Einar Pheasant, MD    PT End of Session - 07/20/22 1436     Visit Number 6    Number of Visits 17    Date for PT Re-Evaluation 08/05/22    Authorization Type [employer]    PT Start Time 1434    PT Stop Time 1517    PT Time Calculation (min) 43 min    Equipment Utilized During Treatment Gait belt    Activity Tolerance Patient tolerated treatment well;No increased pain    Behavior During Therapy [facility] for tasks assessed/performed               Past Medical History:  Diagnosis Date   Abnormal liver function test 10/06/2012   Anemia, unspecified    Asthma    B12 deficiency 02/11/2019   Bleeding internal hemorrhoids 09/13/2017   Choledocholithiasis 06/29/2012   Formatting of this note might be different from the original. Dilated CBD on abdominal imaging 04/2012   Chronic diarrhea 02/16/2013   Colitis    Complication of anesthesia    asthma attack after shoulder surgery   Diabetes mellitus ([place])    type II   Esophagitis    GERD (gastroesophageal reflux disease)    Heart murmur    for years- nothing to be concerned about.   History of kidney stones    Hx of arteriovenous malformation (AVM)    Hypertension    IBS (irritable bowel syndrome)    Pernicious anemia    Personal history of colonic polyps 02/10/2013   02/08/13 colonoscopy - question of rectal varices, two 3-52m polyps in the sigmoid colon, mass at the hepatic flexure, one 531mpolyp at the hepatic flexure, nodule at the ileocecal valve, congested mucusa in the entire colon, flattened villi mucosa in the terminal ileum.     Pneumonia    Psoriatic arthritis ([place]   s/p penicillamine, plaquenil, MTX, sulfasalazine.  s/p Embrel, Humira,.  Iritis.     Pure hypercholesterolemia    Rectal bleeding  07/15/2017   Stroke ([place]/20[place]   per patient   Past Surgical History:  Procedure Laterality Date   ABDOMINAL HYSTERECTOMY  1981   ovaries left in place   ANKLE SURGERY     right   BUNIONECTOMY     CHOLECYSTECTOMY  1989   COLONOSCOPY WITH PROPOFOL N/A 12/27/2017   Procedure: COLONOSCOPY WITH PROPOFOL;  Surgeon: VaLin LandsmanMD;  [facility];  Service: Gastroenterology;  Laterality: N/A;   COLONOSCOPY WITH PROPOFOL N/A 01/04/2019   Procedure: COLONOSCOPY WITH PROPOFOL;  Surgeon: VaLin LandsmanMD;  [place]: Endoscopy;  Laterality: N/A;  Diabetic - oral and injectable   COLONOSCOPY WITH PROPOFOL N/A 07/24/2021   Procedure: COLONOSCOPY WITH PROPOFOL;  Surgeon: VaLin LandsmanMD;  [place] Service: Endoscopy;  Laterality: N/A;  Diabetic   ESOPHAGOGASTRODUODENOSCOPY (EGD) WITH PROPOFOL N/A 12/27/2017   Procedure: ESOPHAGOGASTRODUODENOSCOPY (EGD) WITH PROPOFOL;  Surgeon: VaLin LandsmanMD;  [facility];  Service: Gastroenterology;  Laterality: N/A;   HAND SURGERY     right   HEEL SPUR SURGERY     IR 3D INDEPENDENT WKST  10/01/2021   IR ANGIO INTRA EXTRACRAN SEL COM CAROTID INNOMINATE UNI L MOD SED  10/01/2021   IR ANGIO INTRA EXTRACRAN SEL INTERNAL  CAROTID UNI R MOD SED  10/01/2021   IR ANGIO INTRA EXTRACRAN SEL INTERNAL CAROTID UNI R MOD SED  05/29/2022   IR ANGIO VERTEBRAL SEL VERTEBRAL BILAT MOD SED  10/01/2021   IR ANGIO VERTEBRAL SEL VERTEBRAL UNI R MOD SED  05/29/2022   IR CT HEAD LTD  10/01/2021   IR TRANSCATH/EMBOLIZ  10/01/2021   IR US GUIDE VASC ACCESS RIGHT  10/01/2021   IR US GUIDE VASC ACCESS RIGHT  05/29/2022   NOSE SURGERY     x2   POLYPECTOMY  01/04/2019   Procedure: POLYPECTOMY;  Surgeon: Lin Landsman, MD;  Location: [place];  Service: Endoscopy;;   POLYPECTOMY N/A 07/24/2021   Procedure: POLYPECTOMY;  Surgeon: Lin Landsman, MD;  Location:  [place];  Service: Endoscopy;  Laterality: N/A;   RADIOLOGY WITH ANESTHESIA N/A 10/01/2021   Procedure: Starr Sinclair WITH ANESTHESIA;  Surgeon: Pedro Earls, MD;  Location: [place];  Service: Radiology;  Laterality: N/A;   SHOULDER ARTHROSCOPY WITH OPEN ROTATOR CUFF REPAIR Left 09/20/2018   Procedure: SHOULDER ARTHROSCOPY WITH OPEN ROTATOR CUFF REPAIR;  Surgeon: Corky Mull, MD;  Location: [facility];  Service: Orthopedics;  Laterality: Left;   SHOULDER CLOSED REDUCTION Left 12/21/2018   Procedure: MANIPULATION UNDER ANESTHESIA WITH STEROID INJECTION;  Surgeon: Corky Mull, MD;  Location: [place];  Service: Orthopedics;  Laterality: Left;  Diabetic - insulin and oral meds   UMBILICAL HERNIA REPAIR     XI ROBOTIC ASSISTED INGUINAL HERNIA REPAIR WITH MESH Left 12/16/2021   Procedure: XI ROBOTIC ASSISTED INGUINAL HERNIA REPAIR WITH MESH;  Surgeon: Olean Ree, MD;  Location: [facility];  Service: General;  Laterality: Left;   Patient Active Problem List   Diagnosis Date Noted   History of CVA (cerebrovascular accident) 05/30/2022   Iron deficiency anemia 04/18/2022   Anemia 01/25/2022   Left inguinal hernia    Pre-op evaluation 11/26/2021   Meningioma ([place]) 11/05/2021   Inguinal hernia 10/31/2021   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[place]) 10/31/2021   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   Loss of weight    Ear fullness 05/25/2021   Skin lesion 03/15/2021   Rib pain on left side 08/05/2020   Sleep difficulties 06/08/2020   Swelling of lower extremity 11/26/2019   Healthcare maintenance 09/13/2019   Asthma 12/28/2018   Vitamin D deficiency 11/01/2018   Tendinitis of upper biceps tendon of left shoulder 09/20/2018   Depression, major, single episode, mild ([place]) 09/15/2018   Mild intermittent asthma without complication 71/24/5809   Controlled type 2 diabetes mellitus with complication, with long-term current use of  insulin ([place]) 09/15/2018   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   Traumatic incomplete tear of left rotator cuff 09/09/2018   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 Admitted to hospital on 05/30/22.  REFERRING DIAG: I63.49 (ICD-10-CM) - Cerebrovascular accident (CVA) due to embolism of other cerebral artery (HCC) I67.1 (ICD-10-CM) - Brain aneurysm   THERAPY DIAG:  Difficulty in walking, not elsewhere classified  Muscle weakness (generalized)  Abnormality of gait and mobility  Other lack of coordination  Rationale for Evaluation and Treatment Rehabilitation  SUBJECTIVE:  SUBJECTIVE STATEMENT: Pt stating that she has been having some issues with vertigo over the past 24 hours and got better last night, but when she woke up this morning, it was back.  She notes that if it continues, she will call in to get a prescription to assist with it.  Pt accompanied by: self  PERTINENT HISTORY:  Pt presents to [facility] for acute CVA after cerebral angiogram. Pt reporting L sided weakness due to R pontine infarct. Independent at baseline and is back to work as in last week. 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 Currently relies on RW since CVA mainly in community. Pt denies falls since CVA. Ambulates without AD in household and office at church. Pt says weakness has improved but still has instances of dragging LLE or buckling of knee if she has been doing a lot of standing or  walking. Does report poor eccentric control descending stairs with LLE. Reports she has been living with her grand daughter and plans to move into her older daughter's house although has been independent currently with all ADL's in household and is clear to drive from [place].   PAIN:  Are you having pain? No  PRECAUTIONS: None  WEIGHT BEARING RESTRICTIONS No  FALLS: Has patient fallen in last 6 months? No  LIVING ENVIRONMENT: Lives with: lives with their family Lives in: House/apartment Stairs: Yes: External: 4 in front porch, 3 from garage steps; on right going up and can reach both Has following equipment at home: Walker - 2 wheeled  PLOF: Independent  PATIENT GOALS quit relying on RW, return to full function  OBJECTIVE:   DIAGNOSTIC FINDINGS: Stroke:  acute right paramedian pontine infarction with additional punctate foci of infarction in the right cerebellum and right > left occipital lobes. Etiology is procedural complication from recent cerebral angiogram procedure on 7/28.  MRI  Acute nonhemorrhagic 2 cm right paramedian pontine infarct, punctate foci of infarction in the right cerebellum, and right > left occipital lobes    TODAY'S TREATMENT 07/20/22  Unless otherwise stated, CGA was provided and gait belt donned in order to ensure pt safety  STS with feet on airex pad: 1x10.  Forward mini lunges onto BOSU ball side up, 2x10 each LE Lateral mini lunges onto BOSU ball side up. 2x10/LE.   Side stepping over short side of airex pad, 2x15 each side  Dual tasking:   Head turns (vertical/horizontal) 4x60' each head turn direction  VOR x1 with vertical head turns, 30 sec bouts VOR x1 with horizontal head turns, 30 sec bouts  KoreBalance, Tux downhill x3 bouts, with UE support Korebalance, Neverball, 4 levels, with UE support     PATIENT EDUCATION: Education details:Pt educated throughout session about proper posture and technique with exercises. Improved exercise  technique, movement at target joints, use of target muscles after min to mod verbal, visual, tactile cues.  Person educated: Patient Education method: Customer service manager Education comprehension: verbalized understanding and returned demonstration   HOME EXERCISE PROGRAM: STS Lateral walking Walking with head turns     GOALS: Goals reviewed with patient? No  SHORT TERM GOALS: Target date: 07/08/22  Pt will be independent with HEP to improve balance, strength, and gait to return to independence with gait and ADL's Baseline: Introduced Goal status: INITIAL  LONG TERM GOALS: Target date: 08/05/22  Pt will improve FOTO to target score to demonstrate clinically significant improvement in perceived functional mobility. Baseline: 53 with target score of 64 Goal status: INITIAL  2.  Pt  will improve FGA by 4 points or greater to demonstrate reduced risk of falls with community ambulation tasks.  Baseline: 20/30 Goal status: INITIAL  3.  Pt will improve 6MWT by 164' to demonstrate clinically significant improvement in tolerance for community walking tasks at reduced risk for falls.  Baseline: 1,030' no AD Goal status: INITIAL  4.  Pt will improve 10 m gait speed without AD to >/= 1.2 m/s to demonstrate being a safe Hydrographic surveyor.  Baseline: .93 m/s without AD Goal status: INITIAL  ASSESSMENT:  CLINICAL IMPRESSION:  Pt responded well to treatment approaches and even with noted vestibular symptoms, pt was able to participate in exercises.  Pt to benefit from potentially seeing vestibular therapist in order to assess current dysfunction and treat for potential resolution to current symptoms.  Pt then participated in KoreBalance exercises and tolerated the dynamic balance tasks on the screen.  Pt overall doing well and is making significant progress towards goals.   Pt will continue to benefit from skilled therapy to address remaining deficits in order to improve overall  QoL and return to PLOF.       OBJECTIVE IMPAIRMENTS Abnormal gait, decreased balance, difficulty walking, and decreased strength.   ACTIVITY LIMITATIONS stairs and locomotion level  PARTICIPATION LIMITATIONS: community activity and occupation  PERSONAL FACTORS Age, Fitness, Past/current experiences, Time since onset of injury/illness/exacerbation, and 3+ comorbidities: Anemia, GERD, heart murmur, IBS, HTN  are also affecting patient's functional outcome.   REHAB POTENTIAL: Excellent  CLINICAL DECISION MAKING: Evolving/moderate complexity  EVALUATION COMPLEXITY: Moderate  PLAN: PT FREQUENCY: 1-2x/week  PT DURATION: 8 weeks  PLANNED INTERVENTIONS: Therapeutic exercises, Therapeutic activity, Neuromuscular re-education, Balance training, Gait training, Patient/Family education, Self Care, Stair training, and DME instructions  PLAN FOR NEXT SESSION: Update HEP   Gwenlyn Saran, PT, DPT Physical Therapist- [facility]  07/20/22, 3:28 PM

## 2022-07-20 NOTE — Telephone Encounter (Signed)
Lab ordered for lab appt.  

## 2022-07-20 NOTE — Telephone Encounter (Signed)
LMTCB in regards to lab results. Please transfer pt to me when she returns the call.

## 2022-07-20 NOTE — Telephone Encounter (Signed)
-----   Message from Einar Pheasant, MD sent at 07/20/2022  2:55 AM EDT ----- Please call and notify Ashley Gross that her potassium is wnl.  Please confirm she is taking one potassium per day.  Also confirm she is not taking lasix.  If this is correct, then have her stop the potassium.  Recheck potassium in 10 days to confirm stable.  (Just a potassium check).

## 2022-07-22 ENCOUNTER — Inpatient Hospital Stay: Payer: PPO | Attending: Oncology

## 2022-07-22 ENCOUNTER — Other Ambulatory Visit: Payer: Self-pay | Admitting: Oncology

## 2022-07-22 ENCOUNTER — Ambulatory Visit: Payer: PPO | Admitting: Physical Therapy

## 2022-07-22 DIAGNOSIS — E538 Deficiency of other specified B group vitamins: Secondary | ICD-10-CM | POA: Diagnosis not present

## 2022-07-22 DIAGNOSIS — D508 Other iron deficiency anemias: Secondary | ICD-10-CM

## 2022-07-22 MED ORDER — CYANOCOBALAMIN 1000 MCG/ML IJ SOLN
1000.0000 ug | INTRAMUSCULAR | Status: DC
  Administered 2022-07-22: 1000 ug via INTRAMUSCULAR
  Filled 2022-07-22: qty 1

## 2022-07-27 ENCOUNTER — Ambulatory Visit: Payer: PPO

## 2022-07-27 DIAGNOSIS — M6281 Muscle weakness (generalized): Secondary | ICD-10-CM

## 2022-07-27 DIAGNOSIS — R262 Difficulty in walking, not elsewhere classified: Secondary | ICD-10-CM

## 2022-07-27 DIAGNOSIS — R278 Other lack of coordination: Secondary | ICD-10-CM

## 2022-07-27 DIAGNOSIS — R269 Unspecified abnormalities of gait and mobility: Secondary | ICD-10-CM

## 2022-07-27 NOTE — Therapy (Signed)
OUTPATIENT PHYSICAL THERAPY NEURO TREATMENT   Patient Name: Ashley Gross MRN: [id number] DOB:Oct 23, 1951, 71 y.o., female Today's Date: 07/27/2022   PCP: Einar Pheasant, MD  REFERRING PROVIDER: Einar Pheasant, MD    PT End of Session - 07/27/22 1510     Visit Number 7    Number of Visits 17    Date for PT Re-Evaluation 08/05/22    Authorization Type [employer]    Authorization Time Period 06/10/22-08/05/22    Progress Note Due on Visit 10    PT Start Time 1430    PT Stop Time 1510    PT Time Calculation (min) 40 min    Activity Tolerance Patient tolerated treatment well;No increased pain    Behavior During Therapy [facility] for tasks assessed/performed               Past Medical History:  Diagnosis Date   Abnormal liver function test 10/06/2012   Anemia, unspecified    Asthma    B12 deficiency 02/11/2019   Bleeding internal hemorrhoids 09/13/2017   Choledocholithiasis 06/29/2012   Formatting of this note might be different from the original. Dilated CBD on abdominal imaging 04/2012   Chronic diarrhea 02/16/2013   Colitis    Complication of anesthesia    asthma attack after shoulder surgery   Diabetes mellitus ([place])    type II   Esophagitis    GERD (gastroesophageal reflux disease)    Heart murmur    for years- nothing to be concerned about.   History of kidney stones    Hx of arteriovenous malformation (AVM)    Hypertension    IBS (irritable bowel syndrome)    Pernicious anemia    Personal history of colonic polyps 02/10/2013   02/08/13 colonoscopy - question of rectal varices, two 3-70m polyps in the sigmoid colon, mass at the hepatic flexure, one 520mpolyp at the hepatic flexure, nodule at the ileocecal valve, congested mucusa in the entire colon, flattened villi mucosa in the terminal ileum.     Pneumonia    Psoriatic arthritis ([place]   s/p penicillamine, plaquenil, MTX, sulfasalazine.  s/p Embrel, Humira,.  Iritis.     Pure hypercholesterolemia     Rectal bleeding 07/15/2017   Stroke ([place]07/28/2023   per patient   Past Surgical History:  Procedure Laterality Date   ABDOMINAL HYSTERECTOMY  1981   ovaries left in place   ANKLE SURGERY     right   BUNIONECTOMY     CHOLECYSTECTOMY  1989   COLONOSCOPY WITH PROPOFOL N/A 12/27/2017   Procedure: COLONOSCOPY WITH PROPOFOL;  Surgeon: VaLin LandsmanMD;  [facility];  Service: Gastroenterology;  Laterality: N/A;   COLONOSCOPY WITH PROPOFOL N/A 01/04/2019   Procedure: COLONOSCOPY WITH PROPOFOL;  Surgeon: VaLin LandsmanMD;  [place] Service: Endoscopy;  Laterality: N/A;  Diabetic - oral and injectable   COLONOSCOPY WITH PROPOFOL N/A 07/24/2021   Procedure: COLONOSCOPY WITH PROPOFOL;  Surgeon: VaLin LandsmanMD;  [place] Service: Endoscopy;  Laterality: N/A;  Diabetic   ESOPHAGOGASTRODUODENOSCOPY (EGD) WITH PROPOFOL N/A 12/27/2017   Procedure: ESOPHAGOGASTRODUODENOSCOPY (EGD) WITH PROPOFOL;  Surgeon: VaLin LandsmanMD;  [facility];  Service: Gastroenterology;  Laterality: N/A;   HAND SURGERY     right   HEEL SPUR SURGERY     IR 3D INDEPENDENT WKST  10/01/2021   IR ANGIO INTRA EXTRACRAN SEL COM CAROTID INNOMINATE UNI L MOD SED  10/01/2021  IR ANGIO INTRA EXTRACRAN SEL INTERNAL CAROTID UNI R MOD SED  10/01/2021   IR ANGIO INTRA EXTRACRAN SEL INTERNAL CAROTID UNI R MOD SED  05/29/2022   IR ANGIO VERTEBRAL SEL VERTEBRAL BILAT MOD SED  10/01/2021   IR ANGIO VERTEBRAL SEL VERTEBRAL UNI R MOD SED  05/29/2022   IR CT HEAD LTD  10/01/2021   IR TRANSCATH/EMBOLIZ  10/01/2021   IR US GUIDE VASC ACCESS RIGHT  10/01/2021   IR US GUIDE VASC ACCESS RIGHT  05/29/2022   NOSE SURGERY     x2   POLYPECTOMY  01/04/2019   Procedure: POLYPECTOMY;  Surgeon: Lin Landsman, MD;  Location: [place];  Service: Endoscopy;;   POLYPECTOMY N/A 07/24/2021   Procedure: POLYPECTOMY;  Surgeon: Lin Landsman, MD;  Location: [place];  Service: Endoscopy;  Laterality: N/A;   RADIOLOGY WITH ANESTHESIA N/A 10/01/2021   Procedure: Starr Sinclair WITH ANESTHESIA;  Surgeon: Pedro Earls, MD;  Location: [name];  Service: Radiology;  Laterality: N/A;   SHOULDER ARTHROSCOPY WITH OPEN ROTATOR CUFF REPAIR Left 09/20/2018   Procedure: SHOULDER ARTHROSCOPY WITH OPEN ROTATOR CUFF REPAIR;  Surgeon: Corky Mull, MD;  Location: [facility];  Service: Orthopedics;  Laterality: Left;   SHOULDER CLOSED REDUCTION Left 12/21/2018   Procedure: MANIPULATION UNDER ANESTHESIA WITH STEROID INJECTION;  Surgeon: Corky Mull, MD;  Location: [place];  Service: Orthopedics;  Laterality: Left;  Diabetic - insulin and oral meds   UMBILICAL HERNIA REPAIR     XI ROBOTIC ASSISTED INGUINAL HERNIA REPAIR WITH MESH Left 12/16/2021   Procedure: XI ROBOTIC ASSISTED INGUINAL HERNIA REPAIR WITH MESH;  Surgeon: Olean Ree, MD;  Location: [facility];  Service: General;  Laterality: Left;   Patient Active Problem List   Diagnosis Date Noted   History of CVA (cerebrovascular accident) 05/30/2022   Iron deficiency anemia 04/18/2022   Anemia 01/25/2022   Left inguinal hernia    Pre-op evaluation 11/26/2021   Meningioma ([place]) 11/05/2021   Inguinal hernia 10/31/2021   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[place]) 10/31/2021   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   Loss of weight    Ear fullness 05/25/2021   Skin lesion 03/15/2021   Rib pain on left side 08/05/2020   Sleep difficulties 06/08/2020   Swelling of lower extremity 11/26/2019   Healthcare maintenance 09/13/2019   Asthma 12/28/2018   Vitamin D deficiency 11/01/2018   Tendinitis of upper biceps tendon of left shoulder 09/20/2018   Depression, major, single episode, mild ([place]) 09/15/2018   Mild intermittent asthma without complication 52/06/222   Controlled type 2 diabetes mellitus with complication, with long-term  current use of insulin ([place]) 09/15/2018   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   Traumatic incomplete tear of left rotator cuff 09/09/2018   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 Admitted to hospital on 05/30/22.  REFERRING DIAG: I63.49 (ICD-10-CM) - Cerebrovascular accident (CVA) due to embolism of other cerebral artery (HCC) I67.1 (ICD-10-CM) - Brain aneurysm   THERAPY DIAG:  Difficulty in walking, not elsewhere classified  Muscle weakness (generalized)  Abnormality of gait and mobility  Other lack of coordination  Rationale for  Evaluation and Treatment Rehabilitation  SUBJECTIVE:                                                                                                                                                                                              SUBJECTIVE STATEMENT: Pt stating that she has been having some issues with vertigo over the past 24 hours and got better last night, but when she woke up this morning, it was back.  She notes that if it continues, she will call in to get a prescription to assist with it.  Pt accompanied by: self  PERTINENT HISTORY:  Pt presents to OPPT for acute CVA after cerebral angiogram. Pt reporting L sided weakness due to R pontine infarct. Independent at baseline and is back to work as in last week. 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 Currently relies on RW since CVA mainly in community. Pt denies falls since CVA. Ambulates without AD in household and office at church. Pt says weakness has improved but still has instances of dragging LLE or buckling of knee if she has been doing a lot of  standing or walking. Does report poor eccentric control descending stairs with LLE. Reports she has been living with her grand daughter and plans to move into her older daughter's house although has been independent currently with all ADL's in household and is clear to drive from [place].   PAIN:  Are you having pain? No  PRECAUTIONS: None  WEIGHT BEARING RESTRICTIONS No  FALLS: Has patient fallen in last 6 months? No  LIVING ENVIRONMENT: Lives with: lives with their family Lives in: House/apartment Stairs: Yes: External: 4 in front porch, 3 from garage steps; on right going up and can reach both Has following equipment at home: Walker - 2 wheeled  PLOF: Independent  PATIENT GOALS quit relying on RW, return to full function  OBJECTIVE:   DIAGNOSTIC FINDINGS: Stroke:  acute right paramedian pontine infarction with additional punctate foci of infarction in the right cerebellum and right > left occipital lobes. Etiology is procedural complication from recent cerebral angiogram procedure on 7/28.  MRI  Acute nonhemorrhagic 2 cm right paramedian pontine infarct, punctate foci of infarction in the right cerebellum, and right > left occipital lobes    TODAY'S TREATMENT 07/27/22  Unless otherwise stated, CGA was provided and gait belt donned in order to ensure pt safety  STS with feet on airex pad: 1x10.  Forward mini lunges onto BOSU ball side up, 2x10 each LE Lateral mini lunges onto BOSU ball side up. 2x10/LE.   Side stepping over short side of airex pad, 2x15 each side  Dual tasking:   Head turns (vertical/horizontal) 4x60' each head turn direction  VOR x1 with vertical head turns, 30 sec bouts VOR x1 with horizontal head turns, 30 sec bouts  KoreBalance, Tux downhill x3 bouts, with UE support Korebalance, Neverball, 4 levels, with UE support     PATIENT EDUCATION: Education details:Pt educated throughout session about proper posture and technique with exercises. Improved  exercise technique, movement at target joints, use of target muscles after min to mod verbal, visual, tactile cues.  Person educated: Patient Education method: Customer service manager Education comprehension: verbalized understanding and returned demonstration   HOME EXERCISE PROGRAM: STS Lateral walking Walking with head turns     GOALS: Goals reviewed with patient? No  SHORT TERM GOALS: Target date: 07/08/22  Pt will be independent with HEP to improve balance, strength, and gait to return to independence with gait and ADL's Baseline: Introduced Goal status: INITIAL  LONG TERM GOALS: Target date: 08/05/22  Pt will improve FOTO to target score to demonstrate clinically significant improvement in perceived functional mobility. Baseline: 53 with target score of 64 Goal status: INITIAL  2.  Pt will improve FGA by 4 points or greater to demonstrate reduced risk of falls with community ambulation tasks.  Baseline: 20/30 Goal status: INITIAL  3.  Pt will improve 6MWT by 164' to demonstrate clinically significant improvement in tolerance for community walking tasks at reduced risk for falls.  Baseline: 1,030' no AD Goal status: INITIAL  4.  Pt will improve 10 m gait speed without AD to >/= 1.2 m/s to demonstrate being a safe Hydrographic surveyor.  Baseline: .93 m/s without AD Goal status: INITIAL  ASSESSMENT:  CLINICAL IMPRESSION:  Pt reassessed today at her request, outcome measures showing significant improvements all around, closer to population norms and far beyond MCID. Pt requests DC at this time and author concurs. Pt has returned to premorbid state. Will DC at this time. All treatment goals met, except for 10MWT which remains slightly less than goal but no longer indicative of falls risk. Pt encouraged to return as needed in future.         OBJECTIVE IMPAIRMENTS Abnormal gait, decreased balance, difficulty walking, and decreased strength.   ACTIVITY  LIMITATIONS stairs and locomotion level  PARTICIPATION LIMITATIONS: community activity and occupation  PERSONAL FACTORS Age, Fitness, Past/current experiences, Time since onset of injury/illness/exacerbation, and 3+ comorbidities: Anemia, GERD, heart murmur, IBS, HTN  are also affecting patient's functional outcome.   REHAB POTENTIAL: Excellent  CLINICAL DECISION MAKING: Evolving/moderate complexity  EVALUATION COMPLEXITY: Moderate  PLAN: PT FREQUENCY: 1-2x/week  PT DURATION: 8 weeks  PLANNED INTERVENTIONS: Therapeutic exercises, Therapeutic activity, Neuromuscular re-education, Balance training, Gait training, Patient/Family education, Self Care, Stair training, and DME instructions  PLAN FOR NEXT SESSION: Update HEP  3:12 PM, 07/27/22 Etta Grandchild, PT, DPT Physical Therapist - [facility]  [phone] [facility])

## 2022-07-28 ENCOUNTER — Telehealth: Payer: Self-pay

## 2022-07-28 NOTE — Telephone Encounter (Signed)
Informed pt that we received 2 boxes of insulin degludec (TRESIBA FLEXTOUCH) 100 UNIT/ML FlexTouch Pen & 2 boxes of needles in ofc today 07/28/22 & pt is welcome to come p/u medicine M-F 8-5.  Pt verbalized understanding to info & medicine has been placed in pt assistance fridge.

## 2022-07-29 ENCOUNTER — Ambulatory Visit: Payer: PPO | Admitting: Physical Therapy

## 2022-07-30 ENCOUNTER — Other Ambulatory Visit: Payer: PPO

## 2022-08-03 ENCOUNTER — Telehealth: Payer: Self-pay

## 2022-08-03 ENCOUNTER — Ambulatory Visit: Payer: PPO | Admitting: Physical Therapy

## 2022-08-03 ENCOUNTER — Other Ambulatory Visit (INDEPENDENT_AMBULATORY_CARE_PROVIDER_SITE_OTHER): Payer: PPO

## 2022-08-03 DIAGNOSIS — I10 Essential (primary) hypertension: Secondary | ICD-10-CM

## 2022-08-03 NOTE — Telephone Encounter (Signed)
Patient will come by sometime and filled out form and bring her tax information. She states she still has not received the creon medication,. Called and they said that it just came back in stock and they are working and getting medication filled first come first serve. She should be getting medication shipped soon. Informed patient on mychart

## 2022-08-03 NOTE — Telephone Encounter (Signed)
Patient patient assistance is expiring on 11/01/2022. We will need to fill out new patient assistance form. Called and left a message for call back to inform patient

## 2022-08-04 LAB — COMPREHENSIVE METABOLIC PANEL
ALT: 11 U/L (ref 0–35)
AST: 17 U/L (ref 0–37)
Albumin: 3.6 g/dL (ref 3.5–5.2)
Alkaline Phosphatase: 113 U/L (ref 39–117)
BUN: 15 mg/dL (ref 6–23)
CO2: 27 mEq/L (ref 19–32)
Calcium: 8.5 mg/dL (ref 8.4–10.5)
Chloride: 105 mEq/L (ref 96–112)
Creatinine, Ser: 1.05 mg/dL (ref 0.40–1.20)
GFR: 53.45 mL/min — ABNORMAL LOW (ref >60.00)
Glucose, Bld: 176 mg/dL — ABNORMAL HIGH (ref 70–99)
Potassium: 4.4 mEq/L (ref 3.5–5.1)
Sodium: 140 mEq/L (ref 135–145)
Total Bilirubin: 0.2 mg/dL (ref 0.2–1.2)
Total Protein: 5.5 g/dL — ABNORMAL LOW (ref 6.0–8.3)

## 2022-08-04 LAB — CBC WITH DIFFERENTIAL/PLATELET
Basophils Absolute: 0.1 10*3/uL (ref 0.0–0.1)
Basophils Relative: 1.4 % (ref 0.0–3.0)
Eosinophils Absolute: 0.1 10*3/uL (ref 0.0–0.7)
Eosinophils Relative: 2.1 % (ref 0.0–5.0)
HCT: 35 % — ABNORMAL LOW (ref 36.0–46.0)
Hemoglobin: 11.5 g/dL — ABNORMAL LOW (ref 12.0–15.0)
Lymphocytes Relative: 24.5 % (ref 12.0–46.0)
Lymphs Abs: 1.5 10*3/uL (ref 0.7–4.0)
MCHC: 33 g/dL (ref 30.0–36.0)
MCV: 93.1 fl (ref 78.0–100.0)
Monocytes Absolute: 0.4 10*3/uL (ref 0.1–1.0)
Monocytes Relative: 7.5 % (ref 3.0–12.0)
Neutro Abs: 3.8 10*3/uL (ref 1.4–7.7)
Neutrophils Relative %: 64.5 % (ref 43.0–77.0)
Platelets: 221 10*3/uL (ref 150.0–400.0)
RBC: 3.76 Mil/uL — ABNORMAL LOW (ref 3.87–5.11)
RDW: 13.7 % (ref 11.5–15.5)
WBC: 5.9 10*3/uL (ref 4.0–10.5)

## 2022-08-05 ENCOUNTER — Ambulatory Visit: Payer: PPO | Admitting: Physical Therapy

## 2022-08-10 ENCOUNTER — Ambulatory Visit: Payer: PPO | Admitting: Physical Therapy

## 2022-08-12 ENCOUNTER — Ambulatory Visit: Payer: PPO | Admitting: Physical Therapy

## 2022-08-13 ENCOUNTER — Encounter: Payer: Self-pay | Admitting: Internal Medicine

## 2022-08-13 ENCOUNTER — Ambulatory Visit (INDEPENDENT_AMBULATORY_CARE_PROVIDER_SITE_OTHER): Payer: PPO | Admitting: Internal Medicine

## 2022-08-13 VITALS — BP 110/62 | HR 66 | Temp 98.5°F | Ht 64.0 in | Wt 126.2 lb

## 2022-08-13 DIAGNOSIS — Z23 Encounter for immunization: Secondary | ICD-10-CM | POA: Diagnosis not present

## 2022-08-13 DIAGNOSIS — E237 Disorder of pituitary gland, unspecified: Secondary | ICD-10-CM

## 2022-08-13 DIAGNOSIS — I10 Essential (primary) hypertension: Secondary | ICD-10-CM | POA: Diagnosis not present

## 2022-08-13 DIAGNOSIS — J4520 Mild intermittent asthma, uncomplicated: Secondary | ICD-10-CM | POA: Diagnosis not present

## 2022-08-13 DIAGNOSIS — F439 Reaction to severe stress, unspecified: Secondary | ICD-10-CM

## 2022-08-13 DIAGNOSIS — D329 Benign neoplasm of meninges, unspecified: Secondary | ICD-10-CM

## 2022-08-13 DIAGNOSIS — L4050 Arthropathic psoriasis, unspecified: Secondary | ICD-10-CM | POA: Diagnosis not present

## 2022-08-13 DIAGNOSIS — E118 Type 2 diabetes mellitus with unspecified complications: Secondary | ICD-10-CM

## 2022-08-13 DIAGNOSIS — K219 Gastro-esophageal reflux disease without esophagitis: Secondary | ICD-10-CM

## 2022-08-13 DIAGNOSIS — K51419 Inflammatory polyps of colon with unspecified complications: Secondary | ICD-10-CM

## 2022-08-13 DIAGNOSIS — M7989 Other specified soft tissue disorders: Secondary | ICD-10-CM

## 2022-08-13 DIAGNOSIS — F320 Major depressive disorder, single episode, mild: Secondary | ICD-10-CM

## 2022-08-13 DIAGNOSIS — E7800 Pure hypercholesterolemia, unspecified: Secondary | ICD-10-CM

## 2022-08-13 DIAGNOSIS — I671 Cerebral aneurysm, nonruptured: Secondary | ICD-10-CM

## 2022-08-13 DIAGNOSIS — D500 Iron deficiency anemia secondary to blood loss (chronic): Secondary | ICD-10-CM

## 2022-08-13 DIAGNOSIS — Z794 Long term (current) use of insulin: Secondary | ICD-10-CM

## 2022-08-13 DIAGNOSIS — Z8673 Personal history of transient ischemic attack (TIA), and cerebral infarction without residual deficits: Secondary | ICD-10-CM

## 2022-08-13 NOTE — Patient Instructions (Signed)
Decrease amlodipine to 1/2 ('5mg'$  tablet) per day.

## 2022-08-13 NOTE — Progress Notes (Signed)
Patient ID: Ashley Gross, female   DOB: [birth year], 71 y.o.   MRN: [id number]   Subjective:    Patient ID: Ashley Gross, female    DOB: 1950/12/24, 71 y.o.   MRN: [id number]   Patient here for  Chief Complaint  Patient presents with   Follow-up    2 month follow up   .   HPI Here to follow up regarding her blood sugar and blood pressure.  Seeing dr Marius Ditch for IDA and inflammatory polyposis of the colon as well as exocrine pancreatic insufficiency.  On entyvio.  Feels is helping.  Also receiving parenteral iron therapy.  Recommended continuing creon.  She is eating.  No vomiting.  No chest pain or sob reported.  Has moved in with her daughter.  Handling stress.  Some persistent swelling - ankles/lower extremity.  Request referral to vascular.     Past Medical History:  Diagnosis Date   Abnormal liver function test 10/06/2012   Anemia, unspecified    Asthma    B12 deficiency 02/11/2019   Bleeding internal hemorrhoids 09/13/2017   Choledocholithiasis 06/29/2012   Formatting of this note might be different from the original. Dilated CBD on abdominal imaging 04/2012   Chronic diarrhea 02/16/2013   Colitis    Complication of anesthesia    asthma attack after shoulder surgery   Diabetes mellitus ([place])    type II   Esophagitis    GERD (gastroesophageal reflux disease)    Heart murmur    for years- nothing to be concerned about.   History of kidney stones    Hx of arteriovenous malformation (AVM)    Hypertension    IBS (irritable bowel syndrome)    Pernicious anemia    Personal history of colonic polyps 02/10/2013   02/08/13 colonoscopy - question of rectal varices, two 3-91m polyps in the sigmoid colon, mass at the hepatic flexure, one 527mpolyp at the hepatic flexure, nodule at the ileocecal valve, congested mucusa in the entire colon, flattened villi mucosa in the terminal ileum.     Pneumonia    Psoriatic arthritis ([facility]   s/p penicillamine, plaquenil, MTX, sulfasalazine.   s/p Embrel, Humira,.  Iritis.     Pure hypercholesterolemia    Rectal bleeding 07/15/2017   [place]   per patient   Past Surgical History:  Procedure Laterality Date   ABDOMINAL HYSTERECTOMY  1981   ovaries left in place   ANKLE SURGERY     right   BUNIONECTOMY     CHOLECYSTECTOMY  1989   COLONOSCOPY WITH PROPOFOL N/A 12/27/2017   Procedure: COLONOSCOPY WITH PROPOFOL;  Surgeon: VaLin LandsmanMD;  [facility]  Service: Gastroenterology;  Laterality: N/A;   COLONOSCOPY WITH PROPOFOL N/A 01/04/2019   Procedure: COLONOSCOPY WITH PROPOFOL;  Surgeon: VaLin LandsmanMD;  [place] [place]: [place];  Laterality: N/A;  Diabetic - oral and injectable   COLONOSCOPY WITH PROPOFOL N/A 07/24/2021   Procedure: COLONOSCOPY WITH PROPOFOL;  Surgeon: VaLin LandsmanMD;  [place] Service: Endoscopy;  Laterality: N/A;  Diabetic   ESOPHAGOGASTRODUODENOSCOPY (EGD) WITH PROPOFOL N/A 12/27/2017   Procedure: ESOPHAGOGASTRODUODENOSCOPY (EGD) WITH PROPOFOL;  Surgeon: VaLin LandsmanMD;  [facility];  Service: Gastroenterology;  Laterality: N/A;   HAND SURGERY     right   HEEL SPUR SURGERY     IR 3D INDEPENDENT WKST  10/01/2021   IR ANGIO INTRA EXTRACRAN SEL COM CAROTID INNOMINATE UNI  L MOD SED  10/01/2021   IR ANGIO INTRA EXTRACRAN SEL INTERNAL CAROTID UNI R MOD SED  10/01/2021   IR ANGIO INTRA EXTRACRAN SEL INTERNAL CAROTID UNI R MOD SED  05/29/2022   IR ANGIO VERTEBRAL SEL VERTEBRAL BILAT MOD SED  10/01/2021   IR ANGIO VERTEBRAL SEL VERTEBRAL UNI R MOD SED  05/29/2022   IR CT HEAD LTD  10/01/2021   IR TRANSCATH/EMBOLIZ  10/01/2021   IR US GUIDE VASC ACCESS RIGHT  10/01/2021   IR US GUIDE VASC ACCESS RIGHT  05/29/2022   NOSE SURGERY     x2   POLYPECTOMY  01/04/2019   Procedure: POLYPECTOMY;  Surgeon: Lin Landsman, MD;  Location: [place];  Service: Endoscopy;;   POLYPECTOMY N/A  07/24/2021   Procedure: POLYPECTOMY;  Surgeon: Lin Landsman, MD;  Location: [place];  Service: Endoscopy;  Laterality: N/A;   RADIOLOGY WITH ANESTHESIA N/A 10/01/2021   Procedure: Starr Sinclair WITH ANESTHESIA;  Surgeon: Pedro Earls, MD;  Location: [place];  Service: Radiology;  Laterality: N/A;   SHOULDER ARTHROSCOPY WITH OPEN ROTATOR CUFF REPAIR Left 09/20/2018   Procedure: SHOULDER ARTHROSCOPY WITH OPEN ROTATOR CUFF REPAIR;  Surgeon: Corky Mull, MD;  [facility];  Service: Orthopedics;  Laterality: Left;   SHOULDER CLOSED REDUCTION Left 12/21/2018   Procedure: MANIPULATION UNDER ANESTHESIA WITH STEROID INJECTION;  Surgeon: Corky Mull, MD;  Location: [place];  Service: Orthopedics;  Laterality: Left;  Diabetic - insulin and oral meds   UMBILICAL HERNIA REPAIR     XI ROBOTIC ASSISTED INGUINAL HERNIA REPAIR WITH MESH Left 12/16/2021   Procedure: XI ROBOTIC ASSISTED INGUINAL HERNIA REPAIR WITH MESH;  Surgeon: Olean Ree, MD;  [facility];  Service: General;  Laterality: Left;   Family History  Problem Relation Age of Onset   Diabetes Other    Hypertension Other    Breast cancer Neg Hx    Colon cancer Neg Hx    Social History   Socioeconomic History   Marital status: Widowed    Spouse name: Not on file   Number of children: 2   Years of education: Not on file   Highest education level: Not on file  Occupational History   Not on file  Tobacco Use   Smoking status: Never   Smokeless tobacco: Never  Vaping Use   Vaping Use: Never used  Substance and Sexual Activity   Alcohol use: Never   Drug use: Never   Sexual activity: Not Currently  Other Topics Concern   Not on file  Social History Narrative   She is married and has two children   [place] daughter lives with her.   Social Determinants of Health   Financial Resource Strain: Low Risk  (05/13/2022)   Overall Financial Resource Strain (CARDIA)    Difficulty of  Paying Living Expenses: Not hard at all  Food Insecurity: No Food Insecurity (05/13/2022)   Hunger Vital Sign    Worried About Running Out of Food in the Last Year: Never true    Ran Out of Food in the Last Year: Never true  Transportation Needs: No Transportation Needs (05/13/2022)   PRAPARE - Hydrologist (Medical): No    Lack of Transportation (Non-Medical): No  Physical Activity: Not on file  Stress: No Stress Concern Present (05/13/2022)   [place]    Feeling of Stress : Not at all  Social Connections: Unknown (01/07/2021)   Social Connection and Isolation Panel [NHANES]    Frequency of Communication with Friends and Family: More than three times a week    Frequency of Social Gatherings with Friends and Family: Not on file    Attends Religious Services: Not on file    Active Member of Clubs or Organizations: Not on file    Attends Archivist Meetings: Not on file    Marital Status: Not on file     Review of Systems  Constitutional:  Negative for appetite change and unexpected weight change.  HENT:  Negative for congestion and sinus pressure.   Respiratory:  Negative for cough, chest tightness and shortness of breath.   Cardiovascular:  Negative for chest pain and palpitations.       Some lower extremity swelling.   Gastrointestinal:  Negative for abdominal pain, diarrhea, nausea and vomiting.  Genitourinary:  Negative for difficulty urinating and dysuria.  Musculoskeletal:  Negative for joint swelling and myalgias.  Skin:  Negative for color change and rash.  Neurological:  Negative for dizziness and headaches.  Psychiatric/Behavioral:  Negative for agitation and dysphoric mood.        Objective:     BP 110/62 (BP Location: Left Arm, Patient Position: Sitting, Cuff Size: Normal)   Pulse 66   Temp 98.5 F (36.9 C) (Oral)   Ht '5\' 4"'  (1.626 m)   Wt 126 lb 3.2 oz (57.2  kg)   SpO2 97%   BMI 21.66 kg/m  Wt Readings from Last 3 Encounters:  08/13/22 126 lb 3.2 oz (57.2 kg)  07/15/22 121 lb 2 oz (54.9 kg)  07/07/22 115 lb 12.8 oz (52.5 kg)    Physical Exam Vitals reviewed.  Constitutional:      General: She is not in acute distress.    Appearance: Normal appearance.  HENT:     Head: Normocephalic and atraumatic.     Right Ear: External ear normal.     Left Ear: External ear normal.  Eyes:     General: No scleral icterus.       Right eye: No discharge.        Left eye: No discharge.     Conjunctiva/sclera: Conjunctivae normal.  Neck:     Thyroid: No thyromegaly.  Cardiovascular:     Rate and Rhythm: Normal rate and regular rhythm.  Pulmonary:     Effort: No respiratory distress.     Breath sounds: Normal breath sounds. No wheezing.  Abdominal:     General: Bowel sounds are normal.     Palpations: Abdomen is soft.     Tenderness: There is no abdominal tenderness.  Musculoskeletal:        General: No tenderness.     Cervical back: Neck supple. No tenderness.     Comments: Some ankle and lower extremity swelling. No increased erythema.   Lymphadenopathy:     Cervical: No cervical adenopathy.  Skin:    Findings: No erythema or rash.  Neurological:     Mental Status: She is alert.  Psychiatric:        Mood and Affect: Mood normal.        Behavior: Behavior normal.      Outpatient Encounter Medications as of 08/13/2022  Medication Sig   acetaminophen (TYLENOL) 500 MG tablet Take 2 tablets (1,000 mg total) by mouth every 6 (six) hours as needed for mild pain.   albuterol (PROVENTIL) (2.5 MG/3ML) 0.083% nebulizer solution Take 3 mLs (2.5  mg total) by nebulization every 4 (four) hours as needed for wheezing or shortness of breath.   albuterol (VENTOLIN HFA) 108 (90 Base) MCG/ACT inhaler Inhale 2 puffs into the lungs every 6 (six) hours as needed for wheezing or shortness of breath.   amLODipine (NORVASC) 5 MG tablet Take 1 tablet (5 mg  total) by mouth in the morning and at bedtime.   budesonide-formoterol (SYMBICORT) 160-4.5 MCG/ACT inhaler Inhale 2 puffs into the lungs 2 (two) times daily. (Patient taking differently: Inhale 2 puffs into the lungs 2 (two) times daily as needed (shortness of breath).)   Cholecalciferol (VITAMIN D) 50 MCG (2000 UT) tablet Take 2,000 Units by mouth daily.   CREON 36000-114000 units CPEP capsule Take 1-2 capsules (36,000-72,000 Units total) by mouth See admin instructions. Take 720000 units with each meal and 36000 units with snacks   diphenhydramine-acetaminophen (TYLENOL PM) 25-500 MG TABS tablet Take 2 tablets by mouth at bedtime as needed (sleep).   fluticasone (FLOVENT HFA) 110 MCG/ACT inhaler Inhale 2 puffs into the lungs 2 (two) times daily as needed (shortness of breath).   gabapentin (NEURONTIN) 100 MG capsule Take 400 mg by mouth at bedtime.   insulin degludec (TRESIBA FLEXTOUCH) 100 UNIT/ML FlexTouch Pen Inject 14 Units into the skin daily. (Patient taking differently: Inject 12 Units into the skin daily with supper.)   loperamide (IMODIUM) 2 MG capsule Take 4 mg by mouth daily.   losartan (COZAAR) 100 MG tablet TAKE 1 TABLET(100 MG) BY MOUTH AT BEDTIME   Melatonin 10 MG TABS Take 10 mg by mouth at bedtime.   omeprazole (PRILOSEC) 20 MG capsule TAKE 1 CAPSULE(20 MG) BY MOUTH TWICE DAILY   ONE TOUCH ULTRA TEST test strip PATIENT NEEDS NEW METER STRIPS AND LANCETS FOR ONE TOUCH. HER INSURANCE NO LONGER COVERS ACCUCHEK.   rosuvastatin (CRESTOR) 10 MG tablet TAKE 1 TABLET(10 MG) BY MOUTH DAILY   [DISCONTINUED] potassium chloride (KLOR-CON M) 10 MEQ tablet Take 1 tablet (10 mEq total) by mouth 2 (two) times daily. (Patient not taking: Reported on 08/13/2022)   No facility-administered encounter medications on file as of 08/13/2022.     Lab Results  Component Value Date   WBC 5.9 08/03/2022   HGB 11.5 (L) 08/03/2022   HCT 35.0 (L) 08/03/2022   PLT 221.0 08/03/2022   GLUCOSE 176 (H)  08/03/2022   CHOL 120 05/31/2022   TRIG 80 05/31/2022   HDL 49 05/31/2022   LDLDIRECT 74.0 04/19/2019   LDLCALC 55 05/31/2022   ALT 11 08/03/2022   AST 17 08/03/2022   NA 140 08/03/2022   K 4.4 08/03/2022   CL 105 08/03/2022   CREATININE 1.05 08/03/2022   BUN 15 08/03/2022   CO2 27 08/03/2022   TSH 2.021 07/02/2022   INR 1.0 05/30/2022   HGBA1C 5.0 05/31/2022   MICROALBUR 1.4 04/16/2022       Assessment & Plan:   Problem List Items Addressed This Visit     Anemia    Seeing GI.  entyvio treatments for inflammatory polyposis.  Seeing hematology - iron infusions.  Follow.       Aneurysm of anterior communicating artery    Aneurysm of anterior communicating artery - vascular surgery (08/20/21) .  Underwent diagnostic cerebral angiogram with no recurrent aneurysm.  Recommended one year MRI angiogram - follow up. Schedule f/u with neurology/vascular.       Relevant Orders   Ambulatory referral to Vascular Surgery   Asthma    Breathing stable.  Controlled type 2 diabetes mellitus with complication, with long-term current use of insulin ([place]) - Primary    On insulin.  Follow sugars. Follow met b and a1c       Relevant Orders   Hemoglobin A1c   Depression, major, single episode, mild (HCC)    Increased stress as outlined.  Follow. Has good support.       GERD (gastroesophageal reflux disease)    Continue omeprazole.       History of CVA (cerebrovascular accident)    Embolic stroke following diagnostic cerebral angiogram for known ant communicating artery aneurysm s/p embolization.  MRA head and neck were negative.  ECHO - no evidence of embolic source. With history of significant GI bleed in the past due to AVM, polyp bleed, still requiring intermittent IV transfusion, so at this point she was not started on antiplatelet therapy.      Hypercholesterolemia    Continue crestor. Follow lipid panel and liver function tests.        Relevant Orders   Lipid panel    Hepatic function panel   Hypertension    He is on amlodipine and losartan.  Discussed amlodipine can contribute to leg swelling.  Decrease amlodipine to 1/2 dose.  Follow pressures.  Follow lower extremity swelling.       Relevant Orders   Basic metabolic panel   Inflammatory polyps of colon ([place])    S/p colonoscopy 07/24/21 as outlined.  Continues f/u with GI.  Started entyvio.  Is helping.  Follow.       Meningioma ([place])    Saw neurology 03/10/22 - recommended MRI in 6 months.       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  Seeing neurology.  Follow up with [facility].       Psoriatic arthritis ([place])    Stable.  Has been followed by rheumatology.        Stress    Increased stress with the move and underlying medical issues.  Discussed.  Has good support.  Follow.       Swelling of lower extremity    Compression hose.  ECHO 05/2022 - EF 65% with grade I DD.  Leg elevation.  Monitor salt intake.  Request referral to vascular surgery.        Relevant Orders   Ambulatory referral to Vascular Surgery   Other Visit Diagnoses     Need for immunization against influenza       Relevant Orders   Flu Vaccine QUAD High Dose(Fluad) (Completed)        Einar Pheasant, MD

## 2022-08-13 NOTE — Telephone Encounter (Signed)
Called and left a message for patient to remind her to come by our office to fill out the patient assistance form for the [facility]

## 2022-08-14 ENCOUNTER — Ambulatory Visit
Admission: RE | Admit: 2022-08-14 | Discharge: 2022-08-14 | Disposition: A | Discharge status: Home / Self Care | Payer: PPO | Source: Ambulatory Visit | Attending: Oncology | Admitting: Oncology

## 2022-08-14 DIAGNOSIS — D126 Benign neoplasm of colon, unspecified: Secondary | ICD-10-CM | POA: Insufficient documentation

## 2022-08-14 MED ORDER — SODIUM CHLORIDE FLUSH 0.9 % IV SOLN
INTRAVENOUS | Status: AC
  Filled 2022-08-14: qty 20

## 2022-08-14 MED ORDER — SODIUM CHLORIDE FLUSH 0.9 % IV SOLN
INTRAVENOUS | Status: AC
  Administered 2022-08-14: 10 mL
  Filled 2022-08-14: qty 10

## 2022-08-14 MED ORDER — VEDOLIZUMAB 300 MG IV SOLR
300.0000 mg | Freq: Once | INTRAVENOUS | Status: AC
  Administered 2022-08-14: 300 mg via INTRAVENOUS
  Filled 2022-08-14: qty 5

## 2022-08-17 ENCOUNTER — Telehealth: Payer: Self-pay

## 2022-08-17 ENCOUNTER — Ambulatory Visit: Payer: PPO | Admitting: Physical Therapy

## 2022-08-17 ENCOUNTER — Encounter: Payer: Self-pay | Admitting: Internal Medicine

## 2022-08-17 ENCOUNTER — Telehealth: Payer: Self-pay | Admitting: Internal Medicine

## 2022-08-17 NOTE — Assessment & Plan Note (Signed)
Compression hose.  ECHO 05/2022 - EF 65% with grade I DD.  Leg elevation.  Monitor salt intake.  Request referral to vascular surgery.

## 2022-08-17 NOTE — Assessment & Plan Note (Signed)
Seeing GI.  entyvio treatments for inflammatory polyposis.  Seeing hematology - iron infusions.  Follow.

## 2022-08-17 NOTE — Assessment & Plan Note (Signed)
Continue omeprazole 

## 2022-08-17 NOTE — Assessment & Plan Note (Signed)
On insulin.  Follow sugars. Follow met b and a1c

## 2022-08-17 NOTE — Assessment & Plan Note (Signed)
Increased stress as outlined.  Follow. Has good support.  

## 2022-08-17 NOTE — Assessment & Plan Note (Signed)
MRI as outlined.  Seeing neurology.  Follow up with NSU.

## 2022-08-17 NOTE — Telephone Encounter (Signed)
Attempted to call pt. No answer no voicemail.  

## 2022-08-17 NOTE — Assessment & Plan Note (Addendum)
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  Underwent diagnostic cerebral angiogram with no recurrent aneurysm.  Recommended one year MRI angiogram - follow up. Schedule f/u with neurology/vascular.

## 2022-08-17 NOTE — Assessment & Plan Note (Signed)
Saw neurology 03/10/22 - recommended MRI in 6 months.

## 2022-08-17 NOTE — Assessment & Plan Note (Signed)
Stable.  Has been followed by rheumatology.   

## 2022-08-17 NOTE — Telephone Encounter (Signed)
Ashley Billow, do you mind calling [name] and clarifying something for me.  Please contact pt to clarify her amlodipine dose.  I had discussed with her regarding decreasing dose of amlodipine.  Need to confirm if she was taking '5mg'$  q day or bid.  I had instructed her to decrease to 1/2 tablet, but need to clarify dose she was taking prior to changing to 1/2 tablet.  Was she on q day or bid dosing.  Also need to know if she has a f/u scheduled with neurology.

## 2022-08-17 NOTE — Assessment & Plan Note (Signed)
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  MRA head and neck were negative.  ECHO - no evidence of embolic source. With history of significant GI bleed in the past due to AVM, polyp bleed, still requiring intermittent IV transfusion, so at this point she was not started on antiplatelet therapy. 

## 2022-08-17 NOTE — Telephone Encounter (Signed)
Filled out [employer] provider portion of patient assistance form for Creon. Faxed form to [phone]

## 2022-08-17 NOTE — Assessment & Plan Note (Signed)
Increased stress with the move and underlying medical issues.  Discussed.  Has good support.  Follow.

## 2022-08-17 NOTE — Assessment & Plan Note (Signed)
S/p colonoscopy 07/24/21 as outlined.  Continues f/u with GI.  Started entyvio.  Is helping.  Follow.  

## 2022-08-17 NOTE — Assessment & Plan Note (Signed)
Breathing stable.

## 2022-08-17 NOTE — Assessment & Plan Note (Signed)
Continue crestor.  Follow lipid panel and liver function tests.  

## 2022-08-17 NOTE — Assessment & Plan Note (Signed)
He is on amlodipine and losartan.  Discussed amlodipine can contribute to leg swelling.  Decrease amlodipine to 1/2 dose.  Follow pressures.  Follow lower extremity swelling.

## 2022-08-18 NOTE — Telephone Encounter (Signed)
Spoke with pt and she stated that before her last visit she was taking Amlodipine 5 mg BID. Pt stated that at the appt she was advised to take cut that in half, so she stated that she is taking one 5 mg tablet in the morning. Pt also stated that her neurology appt is scheduled for November.

## 2022-08-18 NOTE — Telephone Encounter (Signed)
Patient states that she has been sick but has not forgot about coming to fill out the patient assistance form. She states she has a appointment tomorrow at [facility] and will come by tomorrow to pick up the forms

## 2022-08-19 ENCOUNTER — Ambulatory Visit: Payer: PPO | Admitting: Physical Therapy

## 2022-08-19 DIAGNOSIS — L600 Ingrowing nail: Secondary | ICD-10-CM | POA: Diagnosis not present

## 2022-08-19 DIAGNOSIS — L909 Atrophic disorder of skin, unspecified: Secondary | ICD-10-CM | POA: Diagnosis not present

## 2022-08-19 DIAGNOSIS — M778 Other enthesopathies, not elsewhere classified: Secondary | ICD-10-CM | POA: Diagnosis not present

## 2022-08-19 DIAGNOSIS — B351 Tinea unguium: Secondary | ICD-10-CM | POA: Diagnosis not present

## 2022-08-21 ENCOUNTER — Inpatient Hospital Stay: Payer: PPO | Attending: Oncology

## 2022-08-21 DIAGNOSIS — E538 Deficiency of other specified B group vitamins: Secondary | ICD-10-CM | POA: Insufficient documentation

## 2022-08-21 DIAGNOSIS — D508 Other iron deficiency anemias: Secondary | ICD-10-CM

## 2022-08-21 MED ORDER — CYANOCOBALAMIN 1000 MCG/ML IJ SOLN
1000.0000 ug | INTRAMUSCULAR | Status: DC
  Administered 2022-08-21: 1000 ug via INTRAMUSCULAR
  Filled 2022-08-21: qty 1

## 2022-08-24 ENCOUNTER — Ambulatory Visit: Payer: PPO | Admitting: Physical Therapy

## 2022-08-24 NOTE — Telephone Encounter (Signed)
Patient brought another form to fill out and filled out form and faxed to [facility] patient assistance

## 2022-08-24 NOTE — Telephone Encounter (Signed)
Patient filled out her portion of the form. Faxed to [facility] patient assistance

## 2022-08-25 ENCOUNTER — Other Ambulatory Visit: Payer: Self-pay | Admitting: *Deleted

## 2022-08-25 DIAGNOSIS — M7989 Other specified soft tissue disorders: Secondary | ICD-10-CM

## 2022-08-26 ENCOUNTER — Ambulatory Visit: Payer: PPO | Admitting: Physical Therapy

## 2022-08-28 ENCOUNTER — Telehealth: Payer: Self-pay

## 2022-08-28 NOTE — Progress Notes (Signed)
[facility] [facility])  [place] Team    08/28/2022  KELSEY DURFLINGER [birth year] [id number]  Reason for referral: Medication Assistance  Referral source:  [place] Technician Current insurance: [employer]  Outreach:  Unsuccessful telephone call with Ms. T[place]ol.  Unable to verify HIPAA identifiers. This is my 2nd unsuccessful outreach attempt, first was on 08/24/22.   Subjective:  Patient referred for patient assistance program (PAP) 2024 renewal/re-enrollment. Patient was approved for Creon, [name], and Symbicort for 2023. Symbicort is no longer available through PAP, patient will need a therapeutic alternative for 2024 PAP.    Plan: Will follow-up in 1-3 business days.  Thanks,  Reed Breech, [place] [phone]

## 2022-08-31 ENCOUNTER — Ambulatory Visit: Payer: PPO | Admitting: Physical Therapy

## 2022-09-02 ENCOUNTER — Telehealth: Payer: Self-pay

## 2022-09-02 ENCOUNTER — Ambulatory Visit: Payer: PPO | Admitting: Physical Therapy

## 2022-09-02 NOTE — Telephone Encounter (Signed)
Received a fax from [facility] patient assistance that they are needed the patient to call Health team advantage to get them to send them a summary of benefits to them. Called and informed patient and she states she will call them today and get them to fax it to [place] or [facility] patient assistance. Gave her our fax number and the other fax number which is [phone]. She states also some one called her Monday from [place] that she was taking over all the  patient assistance for the doctor office. Informed patient when she gets home to call me back with the the lady name and number so we could find out more information

## 2022-09-03 ENCOUNTER — Telehealth: Payer: Self-pay

## 2022-09-03 NOTE — Progress Notes (Signed)
[place] [facility])                                            [place] Team    09/03/2022  Ashley Gross [birth year] [id number]    Dr. Nicki Reaper,     [facility] pharmacy case is being closed due to the following reasons:  -Will close [facility] pharmacy case as I have been unable to engage or maintain contact with patient after three unsuccessful telephone call attempts  Reason for referral: Medication Assistance 2024 PAP Re-enrollment   [place] is happy to assist the patient/family in the future for clinical pharmacy needs, following a discussion from your team about [place] outreach. Thank you for allowing [facility] to be a part of your patient's care.   Thanks,  Reed Breech, [place] [phone]

## 2022-09-04 ENCOUNTER — Encounter (HOSPITAL_COMMUNITY): Payer: PPO

## 2022-09-07 ENCOUNTER — Ambulatory Visit: Payer: PPO | Admitting: Physical Therapy

## 2022-09-09 ENCOUNTER — Ambulatory Visit: Payer: PPO | Admitting: Physical Therapy

## 2022-09-09 NOTE — Telephone Encounter (Signed)
Called [employer] patient assistance and they said they have all the information and we should be receiving a fax of approval in the next couple of days

## 2022-09-14 ENCOUNTER — Ambulatory Visit: Payer: PPO | Admitting: Physical Therapy

## 2022-09-14 NOTE — Telephone Encounter (Signed)
The patient assistance for 2024 is still pending

## 2022-09-14 NOTE — Telephone Encounter (Addendum)
Received a fax again that they needed a summary of benefits called the insurance company and they state they can not fax this but we could print it off there website. Printed it off the website of faxed to [phone]

## 2022-09-16 ENCOUNTER — Ambulatory Visit: Payer: PPO | Admitting: Physical Therapy

## 2022-09-17 NOTE — Telephone Encounter (Signed)
Patient has been approved for [facility] patient assistance till 11/02/23

## 2022-09-18 ENCOUNTER — Inpatient Hospital Stay: Payer: PPO

## 2022-09-18 ENCOUNTER — Inpatient Hospital Stay: Payer: PPO | Attending: Oncology

## 2022-09-18 ENCOUNTER — Other Ambulatory Visit: Payer: Self-pay | Admitting: *Deleted

## 2022-09-18 DIAGNOSIS — D509 Iron deficiency anemia, unspecified: Secondary | ICD-10-CM

## 2022-09-18 DIAGNOSIS — E538 Deficiency of other specified B group vitamins: Secondary | ICD-10-CM

## 2022-09-21 ENCOUNTER — Ambulatory Visit: Payer: PPO | Admitting: Physical Therapy

## 2022-09-23 ENCOUNTER — Ambulatory Visit: Payer: PPO | Admitting: Physical Therapy

## 2022-09-28 ENCOUNTER — Ambulatory Visit: Payer: PPO | Admitting: Physical Therapy

## 2022-09-30 ENCOUNTER — Ambulatory Visit: Payer: PPO | Admitting: Physical Therapy

## 2022-10-01 ENCOUNTER — Other Ambulatory Visit: Payer: Self-pay | Admitting: Internal Medicine

## 2022-10-05 ENCOUNTER — Ambulatory Visit: Payer: PPO | Admitting: Physical Therapy

## 2022-10-06 ENCOUNTER — Encounter (HOSPITAL_COMMUNITY): Payer: Self-pay

## 2022-10-06 ENCOUNTER — Ambulatory Visit (HOSPITAL_COMMUNITY): Payer: PPO

## 2022-10-07 ENCOUNTER — Ambulatory Visit: Payer: PPO | Admitting: Physical Therapy

## 2022-10-09 ENCOUNTER — Ambulatory Visit
Admission: RE | Admit: 2022-10-09 | Discharge: 2022-10-09 | Disposition: A | Discharge status: Home / Self Care | Payer: PPO | Source: Ambulatory Visit | Attending: Gastroenterology | Admitting: Gastroenterology

## 2022-10-09 ENCOUNTER — Other Ambulatory Visit: Payer: PPO

## 2022-10-09 DIAGNOSIS — D126 Benign neoplasm of colon, unspecified: Secondary | ICD-10-CM | POA: Diagnosis not present

## 2022-10-09 MED ORDER — VEDOLIZUMAB 300 MG IV SOLR
300.0000 mg | Freq: Once | INTRAVENOUS | Status: AC
  Administered 2022-10-09: 300 mg via INTRAVENOUS
  Filled 2022-10-09: qty 5

## 2022-10-12 ENCOUNTER — Ambulatory Visit: Payer: PPO | Admitting: Physical Therapy

## 2022-10-12 ENCOUNTER — Other Ambulatory Visit (INDEPENDENT_AMBULATORY_CARE_PROVIDER_SITE_OTHER): Payer: PPO

## 2022-10-12 DIAGNOSIS — E118 Type 2 diabetes mellitus with unspecified complications: Secondary | ICD-10-CM | POA: Diagnosis not present

## 2022-10-12 DIAGNOSIS — E7800 Pure hypercholesterolemia, unspecified: Secondary | ICD-10-CM

## 2022-10-12 DIAGNOSIS — Z794 Long term (current) use of insulin: Secondary | ICD-10-CM

## 2022-10-12 DIAGNOSIS — I10 Essential (primary) hypertension: Secondary | ICD-10-CM

## 2022-10-12 LAB — BASIC METABOLIC PANEL
BUN: 14 mg/dL (ref 6–23)
CO2: 30 mEq/L (ref 19–32)
Calcium: 8.7 mg/dL (ref 8.4–10.5)
Chloride: 104 mEq/L (ref 96–112)
Creatinine, Ser: 0.9 mg/dL (ref 0.40–1.20)
GFR: 64.23 mL/min (ref >60.00)
Glucose, Bld: 119 mg/dL — ABNORMAL HIGH (ref 70–99)
Potassium: 4.2 mEq/L (ref 3.5–5.1)
Sodium: 142 mEq/L (ref 135–145)

## 2022-10-12 LAB — LIPID PANEL
Cholesterol: 127 mg/dL (ref 0–200)
HDL: 50.1 mg/dL (ref >39.00)
LDL Cholesterol: 57 mg/dL (ref 0–99)
NonHDL: 77.22
Total CHOL/HDL Ratio: 3
Triglycerides: 102 mg/dL (ref 0.0–149.0)
VLDL: 20.4 mg/dL (ref 0.0–40.0)

## 2022-10-12 LAB — HEPATIC FUNCTION PANEL
ALT: 11 U/L (ref 0–35)
AST: 18 U/L (ref 0–37)
Albumin: 3.7 g/dL (ref 3.5–5.2)
Alkaline Phosphatase: 91 U/L (ref 39–117)
Bilirubin, Direct: 0.1 mg/dL (ref 0.0–0.3)
Total Bilirubin: 0.5 mg/dL (ref 0.2–1.2)
Total Protein: 5.7 g/dL — ABNORMAL LOW (ref 6.0–8.3)

## 2022-10-12 LAB — HEMOGLOBIN A1C: Hgb A1c MFr Bld: 7.3 % — ABNORMAL HIGH (ref 4.6–6.5)

## 2022-10-14 ENCOUNTER — Ambulatory Visit: Payer: PPO | Admitting: Physical Therapy

## 2022-10-14 ENCOUNTER — Encounter: Payer: Self-pay | Admitting: Gastroenterology

## 2022-10-14 ENCOUNTER — Encounter: Payer: Self-pay | Admitting: Oncology

## 2022-10-14 ENCOUNTER — Ambulatory Visit: Payer: PPO | Admitting: Gastroenterology

## 2022-10-14 VITALS — BP 146/86 | HR 74 | Temp 98.3°F | Ht 64.0 in | Wt 128.0 lb

## 2022-10-14 DIAGNOSIS — K8681 Exocrine pancreatic insufficiency: Secondary | ICD-10-CM | POA: Diagnosis not present

## 2022-10-14 DIAGNOSIS — Z87898 Personal history of other specified conditions: Secondary | ICD-10-CM

## 2022-10-14 DIAGNOSIS — K51411 Inflammatory polyps of colon with rectal bleeding: Secondary | ICD-10-CM | POA: Diagnosis not present

## 2022-10-14 NOTE — Progress Notes (Signed)
Ashley Darby, MD [address]  [place]  [place], [place] [address]  Main: [phone]  Fax: [phone]    Gastroenterology Consultation  Referring Provider:     Einar Pheasant, MD Primary Care Physician:  Einar Pheasant, MD Primary Gastroenterologist:  Dr. Cephas Gross Reason for Consultation:   Unexplained weight loss, exocrine pancreatic insufficiency, inflammatory polyposis of the colon        HPI:   Ashley Gross is a 71 y.o. white female referred by Dr. Einar Pheasant, MD  for consultation & management of ongoing weight loss, recurrence of iron deficiency anemia.  Patient does have history of inflammatory polyposis of the colon with no evidence of underlying IBD since 2014, chronic iron deficiency anemia as well as exocrine pancreatic insufficiency.  Patient has been referred back to me due to worsening of diarrhea with increased rectal bleeding and recurrence of severe iron deficiency anemia.  Her hemoglobin has been stable around 10.5 to 5 months ago.  Decreased to 9.5 on 02/20/2022 and 8.7 on 6/5.  Serum ferritin levels were 8.8.  Therefore, Dr. Nicki Reaper has made an urgent referral.  Patient states that she has been taking Creon 36K 3 times a day with each meal.  She also reports intermittent rectal bleeding, ongoing weight loss.  She is currently off Trulicity, hemoglobin I7O is normal.  Patient is currently taking oral iron supplements only.  With regards to rectal bleeding and inflammatory polyposis, patient for colonoscopy in September 2022 and large polyps were removed which helped in improvement of anemia.  However, rectal bleeding has recurred lately.  During that time, I discussed with patient regarding the role of Entyvio which has been reported in case series for management of inflammatory polyposis.  However, patient did not receive Entyvio infusion.  Follow-up visit 07/15/2022 Patient is here for follow-up of inflammatory polyposis of the colon and weight  loss, chronic diarrhea.  She is started [facility], received 3 induction doses and has been tolerating the infusions well.  She has iron deficiency anemia from chronic blood loss secondary to inflammatory polyposis, received parenteral iron therapy, iron deficiency has improved.  She does have severe B12 deficiency, received B12 injection last month.  She stopped taking Creon because she could not get the medication refilled.  She felt Creon was helping to reduce diarrhea frequency.  She is taking Imodium twice daily, continues to have 4-5 mushy bowel movements daily mixed with blood.  She gained about 12 pounds since last visit.  She is now moved to live with her children along with her husband, has been eating well.  Follow-up visit 10/14/2022 [name] is here for follow-up of inflammatory polyposis of the colon, weight loss and chronic diarrhea secondary to EPI.  Patient is on Entyvio every 8 weeks, maintenance monotherapy has been tolerating it well.  She has gained more than 10 pounds within last 1 year.  Now her hemoglobin A1c is at 7.  She reports intermittent episodes of diarrhea for which she takes Imodium.  She denies rectal bleeding on a regular basis, occasional mucousy drainage per rectum only.  She does not have any GI concerns today.  She has not required any parenteral iron therapy in a long time.  Her anemia has been stable.  No evidence of iron deficiency  GI Procedures:  Colonoscopy 07/24/2021 - The examined portion of the ileum was normal. - Three 5 to 7 mm polyps in the transverse colon, in the ascending colon and  in the cecum, removed with a cold snare. Resected and retrieved. - Normal mucosa in the entire examined colon. Biopsied. - Polyposis at the recto-sigmoid colon, in the sigmoid colon and in the ascending colon. DIAGNOSIS:  A.  COLON POLYP, CECUM; COLD SNARE:  - INFLAMMATORY POLYP.  - NEGATIVE FOR DYSPLASIA AND MALIGNANCY.   B.  COLON POLYP, ASCENDING; COLD SNARE:   - INFLAMMATORY POLYP.  - NEGATIVE FOR DYSPLASIA AND MALIGNANCY.   C.  COLON; RANDOM COLD BIOPSY:  - COLONIC MUCOSA WITH INTACT CRYPT ARCHITECTURE.  - NEGATIVE FOR MICROSCOPIC COLITIS, DYSPLASIA, AND MALIGNANCY.   D.  COLON POLYP, TRANSVERSE; COLD SNARE:  - FECAL FRAGMENTS; NO TISSUE IS IDENTIFIED.   Colonoscopy 01/04/2019 - Hemorrhoids found on perianal exam. - The examined portion of the ileum was normal. - Multiple 10 to 30 mm, recently bleeding polyps in the sigmoid colon and in the descending colon, removed with a hot snare. Polyp resection was incomplete, and the resected tissue was partially retrieved.     Colonoscopy 04/06/2007 Indications:  last colonoscopy 2001, anemia. Impression: -Congested mucosa in the entire colon. -Nodules in the proximal ascending colon, biopsied. -Nodule in the distal ascending colon, biopsied. -Congested erythematous and friable (with contact bleeding) mucosa in the  ascending colon and in the cecum. . "CECUM" (ENDOSCOPIC BIOPSY)     COLONIC MUCOSA WITH NO PATHOLOGIC DIAGNOSIS.     NO ACTIVE OR CHRONIC COLITIS IS SEEN.     NO EVIDENCE OF LYMPHOCYTIC OR COLLAGENOUS COLITIS IS SEEN. B. "RIGHT COLON NODULES" (ENDOSCOPIC BIOPSY):     INFLAMMATORY POLYPS. C. "RECTOSIGMOID COLON" (ENDOSCOPIC BIOPSY):     COLONIC MUCOSA WITH NO PATHOLOGIC DIAGNOSIS.     NO ACTIVE OR CHRONIC COLITIS IS SEEN.     NO EVIDENCE OF LYMPHOCYTIC OR COLLAGENOUS COLITIS IS SEEN. D. "DISTAL ASCENDING COLON" (ENDOSCOPIC BIOPSY):     INFLAMMATORY POLYPS. ------------------------------------------------------------------------------------------------- Colonoscopy 2014  A. "ILEUM" (ENDOSCOPIC BIOPSY):     SMALL INTESTINAL MUCOSA WITH NO PATHOLOGIC DIAGNOSIS.     NO ACTIVE OR CHRONIC ENTERITIS IS SEEN.     NO GRANULOMATOUS INFLAMMATION IS SEEN. B. "COLON, ILEOCECAL VALVE NODULE" (BIOPSY):     COLONIC MUCOSA, WITH REACTIVE CHANGES.     NO ADENOMATOUS MUCOSA IS IDENTIFIED. C.  "RIGHT COLON" (ENDOSCOPIC BIOPSY):     COLONIC MUCOSA WITH NO PATHOLOGIC DIAGNOSIS.     NO ACTIVE OR CHRONIC COLITIS IS SEEN.     NO EVIDENCE OF LYMPHOCYTIC OR COLLAGENOUS COLITIS IS SEEN. D. "HEPATIC FLEXURE MASS" (ENDOSCOPIC BIOPSY):     INFLAMMATORY POLYP.     NO ADENOMATOUS MUCOSA IS SEEN. E. "TRANSVERSE COLON" (ENDOSCOPIC BIOPSY):     COLONIC MUCOSA WITH NO PATHOLOGIC DIAGNOSIS.     NO ACTIVE OR CHRONIC COLITIS IS SEEN.     NO EVIDENCE OF LYMPHOCYTIC OR COLLAGENOUS COLITIS IS SEEN. F. "LEFT COLON" (ENDOSCOPIC BIOPSY):     COLONIC MUCOSA WITH NO PATHOLOGIC DIAGNOSIS.     NO ACTIVE OR CHRONIC COLITIS IS SEEN.     NO EVIDENCE OF LYMPHOCYTIC OR COLLAGENOUS COLITIS IS SEEN.     A MINUTE HYPERPLASTIC POLYP IS ALSO PRESENT. G. "LEFT COLON POLYP(S)" (ENDOSCOPIC POLYPECTOMY):     TUBULAR ADENOMA.     NO HIGH GRADE DYSPLASIA OR CARCINOMA IS SEEN.  Colonoscopy 12/27/2017 - The examined portion of the ileum was normal. Biopsied. - One 12 mm polyp in the ascending colon, removed with a hot snare. Resected and retrieved. - One 40 mm polyp in the transverse colon, removed with  a hot snare. Incomplete resection. Resected tissue retrieved. - A few 8 to 15 mm, non-bleeding polyps in the sigmoid colon. Resection not attempted. - The distal rectum and anal verge are normal on retroflexion view.   Upper endoscopy 12/27/2017 - Normal duodenal bulb and second portion of the duodenum. Biopsied. - Erythematous mucosa in the gastric body and antrum. - Normal cardia, gastric fundus and incisura. Biopsied. - Normal gastroesophageal junction and esophagus.  DIAGNOSIS:  A. DUODENUM; COLD BIOPSY:  - DUODENAL MUCOSA WITH PRESERVED VILLOUS ARCHITECTURE AND BRUNNER'S  GLAND HYPERPLASIA.  - NEGATIVE FOR INTRA-EPITHELIAL LYMPHOCYTOSIS, DYSPLASIA AND MALIGNANCY.   B.  STOMACH; COLD BIOPSY:  - ANTRAL AND OXYNTIC MUCOSA WITH MILD CHRONIC GASTRITIS.  - NEGATIVE FOR H. PYLORI, DYSPLASIA AND MALIGNANCY.   C.   TERMINAL ILEUM; COLD BIOPSY:  - SMALL BOWEL MUCOSA WITH INTACT VILLOUS ARCHITECTURE.  - NEGATIVE FOR INTRAEPITHELIAL LYMPHOCYTOSIS, DYSPLASIA AND MALIGNANCY.   D.  RANDOM RIGHT COLON; COLD BIOPSY:  - COLONIC MUCOSA NEGATIVE FOR MICROSCOPIC COLITIS, DYSPLASIA AND  MALIGNANCY.    E.  COLON POLYP, ASCENDING; HOT SNARE:  - INFLAMMATORY-TYPE POLYP.   F.  COLON POLYP, TRANSVERSE; HOT SNARE:  - INFLAMMATORY-TYPE POLYP.   G.  RANDOM TRANSVERSE COLON; COLD BIOPSY:  - COLONIC MUCOSA NEGATIVE FOR MICROSCOPIC COLITIS, DYSPLASIA AND  MALIGNANCY.   Past Medical History:  Diagnosis Date   Abnormal liver function test 10/06/2012   Anemia, unspecified    Asthma    B12 deficiency 02/11/2019   Bleeding internal hemorrhoids 09/13/2017   Choledocholithiasis 06/29/2012   Formatting of this note might be different from the original. Dilated CBD on abdominal imaging 04/2012   Chronic diarrhea 02/16/2013   Colitis    Complication of anesthesia    asthma attack after shoulder surgery   Diabetes mellitus ([place])    type II   Esophagitis    GERD (gastroesophageal reflux disease)    Heart murmur    for years- nothing to be concerned about.   History of kidney stones    Hx of arteriovenous malformation (AVM)    Hypertension    IBS (irritable bowel syndrome)    Pernicious anemia    Personal history of colonic polyps 02/10/2013   02/08/13 colonoscopy - question of rectal varices, two 3-85m polyps in the sigmoid colon, mass at the hepatic flexure, one 525mpolyp at the hepatic flexure, nodule at the ileocecal valve, congested mucusa in the entire colon, flattened villi mucosa in the terminal ileum.     Pneumonia    Psoriatic arthritis ([facility]   s/p penicillamine, plaquenil, MTX, sulfasalazine.  s/p Embrel, Humira,.  Iritis.     Pure hypercholesterolemia    Rectal bleeding 07/15/2017   Stroke ([facility]/28/2023   per patient    Past Surgical History:  Procedure Laterality Date   ABDOMINAL HYSTERECTOMY   1981   ovaries left in place   ANKLE SURGERY     right   BUNIONECTOMY     CHOLECYSTECTOMY  1989   COLONOSCOPY WITH PROPOFOL N/A 12/27/2017   Procedure: COLONOSCOPY WITH PROPOFOL;  Surgeon: VaLin LandsmanMD;  [facility];  Service: Gastroenterology;  Laterality: N/A;   COLONOSCOPY WITH PROPOFOL N/A 01/04/2019   Procedure: COLONOSCOPY WITH PROPOFOL;  Surgeon: VaLin LandsmanMD;  [place];  Laterality: N/A;  Diabetic - oral and injectable   COLONOSCOPY WITH PROPOFOL N/A 07/24/2021   Procedure: COLONOSCOPY WITH PROPOFOL;  Surgeon: VaLin LandsmanMD;  [facility];  Service: Endoscopy;  Laterality: N/A;  Diabetic   ESOPHAGOGASTRODUODENOSCOPY (EGD) WITH PROPOFOL N/A 12/27/2017   Procedure: ESOPHAGOGASTRODUODENOSCOPY (EGD) WITH PROPOFOL;  Surgeon: Lin Landsman, MD;  Location: [facility];  Service: Gastroenterology;  Laterality: N/A;   HAND SURGERY     right   HEEL SPUR SURGERY     IR 3D INDEPENDENT WKST  10/01/2021   IR ANGIO INTRA EXTRACRAN SEL COM CAROTID INNOMINATE UNI L MOD SED  10/01/2021   IR ANGIO INTRA EXTRACRAN SEL INTERNAL CAROTID UNI R MOD SED  10/01/2021   IR ANGIO INTRA EXTRACRAN SEL INTERNAL CAROTID UNI R MOD SED  05/29/2022   IR ANGIO VERTEBRAL SEL VERTEBRAL BILAT MOD SED  10/01/2021   IR ANGIO VERTEBRAL SEL VERTEBRAL UNI R MOD SED  05/29/2022   IR CT HEAD LTD  10/01/2021   IR TRANSCATH/EMBOLIZ  10/01/2021   IR US GUIDE VASC ACCESS RIGHT  10/01/2021   IR US GUIDE VASC ACCESS RIGHT  05/29/2022   NOSE SURGERY     x2   POLYPECTOMY  01/04/2019   Procedure: POLYPECTOMY;  Surgeon: Lin Landsman, MD;  Location: [place];  Service: Endoscopy;;   POLYPECTOMY N/A 07/24/2021   Procedure: POLYPECTOMY;  Surgeon: Lin Landsman, MD;  Location: [place];  Service: Endoscopy;  Laterality: N/A;   RADIOLOGY WITH ANESTHESIA N/A 10/01/2021   Procedure: Starr Sinclair WITH  ANESTHESIA;  Surgeon: Pedro Earls, MD;  Location: [place];  Service: Radiology;  Laterality: N/A;   SHOULDER ARTHROSCOPY WITH OPEN ROTATOR CUFF REPAIR Left 09/20/2018   Procedure: SHOULDER ARTHROSCOPY WITH OPEN ROTATOR CUFF REPAIR;  Surgeon: Corky Mull, MD;  Location: [facility];  Service: Orthopedics;  Laterality: Left;   SHOULDER CLOSED REDUCTION Left 12/21/2018   Procedure: MANIPULATION UNDER ANESTHESIA WITH STEROID INJECTION;  Surgeon: Corky Mull, MD;  Location: [place];  Service: Orthopedics;  Laterality: Left;  Diabetic - insulin and oral meds   UMBILICAL HERNIA REPAIR     XI ROBOTIC ASSISTED INGUINAL HERNIA REPAIR WITH MESH Left 12/16/2021   Procedure: XI ROBOTIC ASSISTED INGUINAL HERNIA REPAIR WITH MESH;  Surgeon: Olean Ree, MD;  Location: [facility];  Service: General;  Laterality: Left;     Current Outpatient Medications:    acetaminophen (TYLENOL) 500 MG tablet, Take 2 tablets (1,000 mg total) by mouth every 6 (six) hours as needed for mild pain., Disp: , Rfl:    albuterol (PROVENTIL) (2.5 MG/3ML) 0.083% nebulizer solution, Take 3 mLs (2.5 mg total) by nebulization every 4 (four) hours as needed for wheezing or shortness of breath., Disp: 360 mL, Rfl: 12   albuterol (VENTOLIN HFA) 108 (90 Base) MCG/ACT inhaler, Inhale 2 puffs into the lungs every 6 (six) hours as needed for wheezing or shortness of breath., Disp: , Rfl:    amLODipine (NORVASC) 5 MG tablet, TAKE 1 TABLET(5 MG) BY MOUTH TWICE DAILY, Disp: 180 tablet, Rfl: 1   budesonide-formoterol (SYMBICORT) 160-4.5 MCG/ACT inhaler, Inhale 2 puffs into the lungs 2 (two) times daily. (Patient taking differently: Inhale 2 puffs into the lungs 2 (two) times daily as needed (shortness of breath).), Disp: 3 each, Rfl: 3   Cholecalciferol (VITAMIN D) 50 MCG (2000 UT) tablet, Take 2,000 Units by mouth daily., Disp: , Rfl:    CREON 36000-114000 units CPEP capsule, Take 1-2 capsules (36,000-72,000 Units total) by  mouth See admin instructions. Take 720000 units with each meal and 36000 units with snacks, Disp: 240 capsule, Rfl: 3  diphenhydramine-acetaminophen (TYLENOL PM) 25-500 MG TABS tablet, Take 2 tablets by mouth at bedtime as needed (sleep)., Disp: , Rfl:    fluticasone (FLOVENT HFA) 110 MCG/ACT inhaler, Inhale 2 puffs into the lungs 2 (two) times daily as needed (shortness of breath)., Disp: , Rfl:    gabapentin (NEURONTIN) 100 MG capsule, Take 400 mg by mouth at bedtime., Disp: , Rfl:    insulin degludec (TRESIBA FLEXTOUCH) 100 UNIT/ML FlexTouch Pen, Inject 14 Units into the skin daily. (Patient taking differently: Inject 12 Units into the skin daily with supper.), Disp: 12 mL, Rfl: 2   loperamide (IMODIUM) 2 MG capsule, Take 4 mg by mouth daily., Disp: , Rfl:    losartan (COZAAR) 100 MG tablet, TAKE 1 TABLET(100 MG) BY MOUTH AT BEDTIME, Disp: 90 tablet, Rfl: 1   Melatonin 10 MG TABS, Take 10 mg by mouth at bedtime., Disp: , Rfl:    omeprazole (PRILOSEC) 20 MG capsule, TAKE 1 CAPSULE(20 MG) BY MOUTH TWICE DAILY, Disp: 180 capsule, Rfl: 3   ONE TOUCH ULTRA TEST test strip, PATIENT NEEDS NEW METER STRIPS AND LANCETS FOR ONE TOUCH. HER INSURANCE NO LONGER COVERS ACCUCHEK., Disp: 100 each, Rfl: PRN   rosuvastatin (CRESTOR) 10 MG tablet, TAKE 1 TABLET(10 MG) BY MOUTH DAILY, Disp: 90 tablet, Rfl: 3   Family History  Problem Relation Age of Onset   Diabetes Other    Hypertension Other    Breast cancer Neg Hx    Colon cancer Neg Hx      Social History   Tobacco Use   Smoking status: Never   Smokeless tobacco: Never  Vaping Use   Vaping Use: Never used  Substance Use Topics   Alcohol use: Never   Drug use: Never    Allergies as of 10/14/2022 - Review Complete 10/14/2022  Allergen Reaction Noted   Aspirin Other (See Comments) 10/06/2012   Beta adrenergic blockers  10/06/2012   Lisinopril Cough 12/21/2018   Mtx support [cobalamine combinations] Other (See Comments) 10/06/2012   Vasotec  [enalapril] Cough 10/06/2012   Verapamil Other (See Comments) 10/06/2012   Voltaren [diclofenac sodium]  10/06/2012   Erythromycin Other (See Comments) 10/06/2012   Indocin [indomethacin]  10/06/2012   Jardiance [empagliflozin] Other (See Comments) 02/26/2020    Review of Systems:    All systems reviewed and negative except where noted in HPI.   Physical Exam:  BP (!) 150/83 (BP Location: Left Arm, Patient Position: Sitting, Cuff Size: Normal)   Pulse 83   Temp 98.3 F (36.8 C) (Oral)   Ht '5\' 4"'$  (1.626 m)   Wt 128 lb (58.1 kg)   BMI 21.97 kg/m  No LMP recorded. Patient has had a hysterectomy.  General:   Alert, moderately built, moderately nourished, pleasant and cooperative in NAD Head:  Normocephalic and atraumatic. Eyes:  Sclera clear, no icterus.   Conjunctiva pink. Ears:  Normal auditory acuity. Nose:  No deformity, discharge, or lesions. Mouth:  No deformity or lesions,oropharynx pink & moist. Neck:  Supple; no masses or thyromegaly. Lungs:  Respirations even and unlabored.  Clear throughout to auscultation.   No wheezes, crackles, or rhonchi. No acute distress. Heart:  Regular rate and rhythm; no murmurs, clicks, rubs, or gallops. Abdomen:  Normal bowel sounds.  Obese, Soft, non-tender distended, no hepatosplenomegaly.  No guarding or rebound tenderness.   Rectal: Not done today Msk:  Symmetrical without gross deformities. Good, equal movement & strength bilaterally. Pulses:  Normal pulses noted. Extremities:  No clubbing or edema.  No cyanosis. Neurologic:  Alert and oriented x3;  grossly normal neurologically. Skin:  Intact without significant lesions or rashes. No jaundice. Psych:  Alert and cooperative. Normal mood and affect.  Imaging Studies: Reviewed  Assessment and Plan:   TAVARIA MACKINS is a 71 y.o. white female with h/o psoriatic arthritis, chronic diarrhea, inflammatory polyps in colon presents for f/u of rectal bleeding and chronic diarrhea. She  underwent ligation of RA, RP. LL internal hemorrhoids. Had extensive workup in the past which is negative for gastrinoma, IBD, microscopic colitis, celiac disease, chronic infections.  Rectal bleeding had resolved after resection of large inflammatory polyps.  However, she developed severe weight loss, severe iron deficiency anemia with recurrence of rectal bleeding.  Therefore, she was initiated on Entyvio monotherapy in July 2023.  Her symptoms have resolved currently  Inflammatory polyposis of the colon are inflammatory Polyposis is a rare phenomenon which can lead to rectal bleeding and chronic diarrhea.  This may or may not be associated with inflammatory bowel disease.  Her colon biopsies were negative for inflammatory bowel disease.  Few case reports have been mentioned in the literature that in addition to removal of the large polyps, there might be a role for immunosuppressive therapy such as Biologics.  After discussing about the role of Entyvio, patient has been consented to start Entyvio monotherapy.  Her symptoms have currently resolved  Severe exocrine pancreatic insufficiency: Weight loss has resolved, patient regained more than 10 pounds within last 1 year.  Normal BMI Fecal elastase level 64 in 07/2021 Continue Creon or Zenpep 2 capsules with each meal and 1 with snack, based on her weight CT abdomen and pelvis with contrast revealed diffuse edema of the large bowel secondary to inflammatory polyposis Encouraged high-protein diet, protein supplements and high calorie diet encouraged her to maintain weight log.   Iron and B12 deficiency anemia: Currently resolved Continue to follow-up with hematology for parenteral iron therapy as needed Over-the-counter B12 supplements 500 to 1,000 mcg daily   Follow up in 6 months   Ashley Darby, MD

## 2022-10-16 ENCOUNTER — Encounter: Payer: Self-pay | Admitting: Internal Medicine

## 2022-10-16 ENCOUNTER — Ambulatory Visit (INDEPENDENT_AMBULATORY_CARE_PROVIDER_SITE_OTHER): Payer: PPO | Admitting: Internal Medicine

## 2022-10-16 VITALS — BP 108/66 | HR 73 | Temp 98.0°F | Resp 15 | Ht 64.0 in | Wt 125.8 lb

## 2022-10-16 DIAGNOSIS — I10 Essential (primary) hypertension: Secondary | ICD-10-CM | POA: Diagnosis not present

## 2022-10-16 DIAGNOSIS — E1165 Type 2 diabetes mellitus with hyperglycemia: Secondary | ICD-10-CM | POA: Diagnosis not present

## 2022-10-16 DIAGNOSIS — E7800 Pure hypercholesterolemia, unspecified: Secondary | ICD-10-CM

## 2022-10-16 DIAGNOSIS — F320 Major depressive disorder, single episode, mild: Secondary | ICD-10-CM | POA: Diagnosis not present

## 2022-10-16 DIAGNOSIS — Z794 Long term (current) use of insulin: Secondary | ICD-10-CM

## 2022-10-16 DIAGNOSIS — Z8673 Personal history of transient ischemic attack (TIA), and cerebral infarction without residual deficits: Secondary | ICD-10-CM

## 2022-10-16 DIAGNOSIS — I671 Cerebral aneurysm, nonruptured: Secondary | ICD-10-CM

## 2022-10-16 DIAGNOSIS — J4520 Mild intermittent asthma, uncomplicated: Secondary | ICD-10-CM | POA: Diagnosis not present

## 2022-10-16 DIAGNOSIS — K219 Gastro-esophageal reflux disease without esophagitis: Secondary | ICD-10-CM | POA: Diagnosis not present

## 2022-10-16 DIAGNOSIS — D329 Benign neoplasm of meninges, unspecified: Secondary | ICD-10-CM

## 2022-10-16 DIAGNOSIS — E237 Disorder of pituitary gland, unspecified: Secondary | ICD-10-CM

## 2022-10-16 DIAGNOSIS — E1121 Type 2 diabetes mellitus with diabetic nephropathy: Secondary | ICD-10-CM | POA: Diagnosis not present

## 2022-10-16 DIAGNOSIS — K51419 Inflammatory polyps of colon with unspecified complications: Secondary | ICD-10-CM | POA: Diagnosis not present

## 2022-10-16 DIAGNOSIS — L4050 Arthropathic psoriasis, unspecified: Secondary | ICD-10-CM

## 2022-10-16 LAB — HM DIABETES FOOT EXAM

## 2022-10-16 MED ORDER — LOSARTAN POTASSIUM 100 MG PO TABS: ORAL_TABLET | ORAL | 1 refills | Status: DC

## 2022-10-16 MED ORDER — TRESIBA FLEXTOUCH 100 UNIT/ML ~~LOC~~ SOPN: 16.0000 [IU] | PEN_INJECTOR | Freq: Every day | SUBCUTANEOUS | 2 refills | Status: DC

## 2022-10-16 NOTE — Progress Notes (Signed)
Patient ID: Ashley Gross, female   DOB: 05-Jul-1951, 71 y.o.   MRN: [id number]   Subjective:    Patient ID: Ashley Gross, female    DOB: [birth year], 71 y.o.   MRN: [id number]   Patient here for  Chief Complaint  Patient presents with   Medical Management of Chronic Issues   Hypertension   Diabetes   .   HPI Here to follow up regarding her blood sugar and blood pressure.  Seeing Dr Marius Ditch for IDA and inflammatory polyposis of the colon as well as exocrine pancreatic insufficiency.  On entyvio.  Feels is helping.  Also receiving parenteral iron therapy.  Recommended continuing creon.  She is eating.  No vomiting.  No chest pain or sob reported.  Has moved in with her daughter.  Handling stress.  Was having persistent swelling - ankles/lower extremity.  Referred to vascular.  Just saw Dr Marius Ditch for f/u inflammatory polyps, chronic diarrhea and exocrine pancreatic insufficiency.  Stable.  Continue entyvio. States she has more good days than bad days.  Takes imodium prn. States blood sugars - 130-140s.     Past Medical History:  Diagnosis Date   Abnormal liver function test 10/06/2012   Anemia 01/25/2022   Anemia, unspecified    Asthma    B12 deficiency 02/11/2019   Bleeding internal hemorrhoids 09/13/2017   Choledocholithiasis 06/29/2012   Formatting of this note might be different from the original. Dilated CBD on abdominal imaging 04/2012   Chronic diarrhea 02/16/2013   Colitis    Complication of anesthesia    asthma attack after shoulder surgery   Diabetes mellitus ([place])    type II   Esophagitis    GERD (gastroesophageal reflux disease)    Heart murmur    for years- nothing to be concerned about.   History of kidney stones    Hx of arteriovenous malformation (AVM)    Hypertension    IBS (irritable bowel syndrome)    Loss of weight    Pernicious anemia    Personal history of colonic polyps 02/10/2013   02/08/13 colonoscopy - question of rectal varices, two 3-39m polyps in  the sigmoid colon, mass at the hepatic flexure, one 544mpolyp at the hepatic flexure, nodule at the ileocecal valve, congested mucusa in the entire colon, flattened villi mucosa in the terminal ileum.     Pneumonia    Psoriatic arthritis ([place]   s/p penicillamine, plaquenil, MTX, sulfasalazine.  s/p Embrel, Humira,.  Iritis.     Pure hypercholesterolemia    Rectal bleeding 07/15/2017   [place]   per patient   Past Surgical History:  Procedure Laterality Date   ABDOMINAL HYSTERECTOMY  1981   ovaries left in place   ANKLE SURGERY     right   BUNIONECTOMY     CHOLECYSTECTOMY  1989   COLONOSCOPY WITH PROPOFOL N/A 12/27/2017   Procedure: COLONOSCOPY WITH PROPOFOL;  Surgeon: VaLin LandsmanMD;  [facility];  Service: Gastroenterology;  Laterality: N/A;   COLONOSCOPY WITH PROPOFOL N/A 01/04/2019   Procedure: COLONOSCOPY WITH PROPOFOL;  Surgeon: VaLin LandsmanMD;  [place]: [place];  Laterality: N/A;  Diabetic - oral and injectable   COLONOSCOPY WITH PROPOFOL N/A 07/24/2021   Procedure: COLONOSCOPY WITH PROPOFOL;  Surgeon: VaLin LandsmanMD;  [place]: [place];  Laterality: N/A;  Diabetic   ESOPHAGOGASTRODUODENOSCOPY (EGD) WITH PROPOFOL N/A 12/27/2017   Procedure: ESOPHAGOGASTRODUODENOSCOPY (EGD) WITH PROPOFOL;  Surgeon: Lin Landsman, MD;  Location: [facility] ENDOSCOPY;  Service: Gastroenterology;  Laterality: N/A;   HAND SURGERY     right   HEEL SPUR SURGERY     IR 3D INDEPENDENT WKST  10/01/2021   IR ANGIO INTRA EXTRACRAN SEL COM CAROTID INNOMINATE UNI L MOD SED  10/01/2021   IR ANGIO INTRA EXTRACRAN SEL INTERNAL CAROTID UNI R MOD SED  10/01/2021   IR ANGIO INTRA EXTRACRAN SEL INTERNAL CAROTID UNI R MOD SED  05/29/2022   IR ANGIO VERTEBRAL SEL VERTEBRAL BILAT MOD SED  10/01/2021   IR ANGIO VERTEBRAL SEL VERTEBRAL UNI R MOD SED  05/29/2022   IR CT HEAD LTD  10/01/2021   IR  TRANSCATH/EMBOLIZ  10/01/2021   IR US GUIDE VASC ACCESS RIGHT  10/01/2021   IR US GUIDE VASC ACCESS RIGHT  05/29/2022   NOSE SURGERY     x2   POLYPECTOMY  01/04/2019   Procedure: POLYPECTOMY;  Surgeon: Lin Landsman, MD;  Location: [place];  Service: Endoscopy;;   POLYPECTOMY N/A 07/24/2021   Procedure: POLYPECTOMY;  Surgeon: Lin Landsman, MD;  Location: [place];  Service: Endoscopy;  Laterality: N/A;   RADIOLOGY WITH ANESTHESIA N/A 10/01/2021   Procedure: Starr Sinclair WITH ANESTHESIA;  Surgeon: Pedro Earls, MD;  Location: [place];  Service: Radiology;  Laterality: N/A;   SHOULDER ARTHROSCOPY WITH OPEN ROTATOR CUFF REPAIR Left 09/20/2018   Procedure: SHOULDER ARTHROSCOPY WITH OPEN ROTATOR CUFF REPAIR;  Surgeon: Corky Mull, MD;  Location: [facility];  Service: Orthopedics;  Laterality: Left;   SHOULDER CLOSED REDUCTION Left 12/21/2018   Procedure: MANIPULATION UNDER ANESTHESIA WITH STEROID INJECTION;  Surgeon: Corky Mull, MD;  Location: [place];  Service: Orthopedics;  Laterality: Left;  Diabetic - insulin and oral meds   UMBILICAL HERNIA REPAIR     XI ROBOTIC ASSISTED INGUINAL HERNIA REPAIR WITH MESH Left 12/16/2021   Procedure: XI ROBOTIC ASSISTED INGUINAL HERNIA REPAIR WITH MESH;  Surgeon: Olean Ree, MD;  Location: [facility];  Service: General;  Laterality: Left;   Family History  Problem Relation Age of Onset   Diabetes Other    Hypertension Other    Breast cancer Neg Hx    Colon cancer Neg Hx    Social History   Socioeconomic History   Marital status: Widowed    Spouse name: Not on file   Number of children: 2   Years of education: Not on file   Highest education level: Not on file  Occupational History   Not on file  Tobacco Use   Smoking status: Never   Smokeless tobacco: Never  Vaping Use   Vaping Use: Never used  Substance and Sexual Activity   Alcohol use: Never   Drug use: Never   Sexual activity:  Not Currently  Other Topics Concern   Not on file  Social History Narrative   She is married and has two children   [place] daughter lives with her.   Social Determinants of Health   Financial Resource Strain: Low Risk  (05/13/2022)   Overall Financial Resource Strain (CARDIA)    Difficulty of Paying Living Expenses: Not hard at all  Food Insecurity: No Food Insecurity (05/13/2022)   Hunger Vital Sign    Worried About Running Out of Food in the Last Year: Never true    Ran Out of Food in the Last Year: Never true  Transportation Needs: No Transportation Needs (05/13/2022)   PRAPARE - Transportation  Lack of Transportation (Medical): No    Lack of Transportation (Non-Medical): No  Physical Activity: Not on file  Stress: No Stress Concern Present (05/13/2022)   [place]    Feeling of Stress : Not at all  Social Connections: Unknown (01/07/2021)   Social Connection and Isolation Panel [NHANES]    Frequency of Communication with Friends and Family: More than three times a week    Frequency of Social Gatherings with Friends and Family: Not on file    Attends Religious Services: Not on file    Active Member of Clubs or Organizations: Not on file    Attends Archivist Meetings: Not on file    Marital Status: Not on file     Review of Systems  Constitutional:  Negative for appetite change and unexpected weight change.  HENT:  Negative for congestion and sinus pressure.   Respiratory:  Negative for cough, chest tightness and shortness of breath.   Cardiovascular:  Negative for chest pain and palpitations.       No increased swelling currently.   Gastrointestinal:  Negative for abdominal pain, nausea and vomiting.       Intermittent diarrhea as outlined.   Genitourinary:  Negative for difficulty urinating and dysuria.  Musculoskeletal:  Negative for joint swelling and myalgias.  Skin:  Negative for color change  and rash.  Neurological:  Negative for dizziness and headaches.  Psychiatric/Behavioral:  Negative for agitation and dysphoric mood.        Objective:     BP 108/66 (BP Location: Left Arm, Patient Position: Sitting, Cuff Size: Small)   Pulse 73   Temp 98 F (36.7 C) (Temporal)   Resp 15   Ht _0  (1.626 m)   Wt 125 lb 12.8 oz (57.1 kg)   SpO2 95%   BMI 21.59 kg/m  Wt Readings from Last 3 Encounters:  10/16/22 125 lb 12.8 oz (57.1 kg)  10/14/22 128 lb (58.1 kg)  08/13/22 126 lb 3.2 oz (57.2 kg)    Physical Exam Vitals reviewed.  Constitutional:      General: She is not in acute distress.    Appearance: Normal appearance.  HENT:     Head: Normocephalic and atraumatic.     Right Ear: External ear normal.     Left Ear: External ear normal.  Eyes:     General: No scleral icterus.       Right eye: No discharge.        Left eye: No discharge.     Conjunctiva/sclera: Conjunctivae normal.  Neck:     Thyroid: No thyromegaly.  Cardiovascular:     Rate and Rhythm: Normal rate and regular rhythm.  Pulmonary:     Effort: No respiratory distress.     Breath sounds: Normal breath sounds. No wheezing.  Abdominal:     General: Bowel sounds are normal.     Palpations: Abdomen is soft.     Tenderness: There is no abdominal tenderness.  Musculoskeletal:        General: No tenderness.     Cervical back: Neck supple. No tenderness.     Comments: Some ankle and lower extremity swelling. No increased erythema.   Lymphadenopathy:     Cervical: No cervical adenopathy.  Skin:    Findings: No erythema or rash.  Neurological:     Mental Status: She is alert.  Psychiatric:        Mood and Affect: Mood normal.  Behavior: Behavior normal.      Outpatient Encounter Medications as of 10/16/2022  Medication Sig   acetaminophen (TYLENOL) 500 MG tablet Take 2 tablets (1,000 mg total) by mouth every 6 (six) hours as needed for mild pain.   albuterol (PROVENTIL) (2.5 MG/3ML)  0.083% nebulizer solution Take 3 mLs (2.5 mg total) by nebulization every 4 (four) hours as needed for wheezing or shortness of breath.   albuterol (VENTOLIN HFA) 108 (90 Base) MCG/ACT inhaler Inhale 2 puffs into the lungs every 6 (six) hours as needed for wheezing or shortness of breath.   amLODipine (NORVASC) 5 MG tablet TAKE 1 TABLET(5 MG) BY MOUTH TWICE DAILY   budesonide-formoterol (SYMBICORT) 160-4.5 MCG/ACT inhaler Inhale 2 puffs into the lungs 2 (two) times daily. (Patient taking differently: Inhale 2 puffs into the lungs 2 (two) times daily as needed (shortness of breath).)   Cholecalciferol (VITAMIN D) 50 MCG (2000 UT) tablet Take 2,000 Units by mouth daily.   CREON 36000-114000 units CPEP capsule Take 1-2 capsules (36,000-72,000 Units total) by mouth See admin instructions. Take 720000 units with each meal and 36000 units with snacks   diphenhydramine-acetaminophen (TYLENOL PM) 25-500 MG TABS tablet Take 2 tablets by mouth at bedtime as needed (sleep).   fluticasone (FLOVENT HFA) 110 MCG/ACT inhaler Inhale 2 puffs into the lungs 2 (two) times daily as needed (shortness of breath).   gabapentin (NEURONTIN) 100 MG capsule Take 400 mg by mouth at bedtime.   loperamide (IMODIUM) 2 MG capsule Take 4 mg by mouth daily.   Melatonin 10 MG TABS Take 10 mg by mouth at bedtime.   omeprazole (PRILOSEC) 20 MG capsule TAKE 1 CAPSULE(20 MG) BY MOUTH TWICE DAILY   ONE TOUCH ULTRA TEST test strip PATIENT NEEDS NEW METER STRIPS AND LANCETS FOR ONE TOUCH. HER INSURANCE NO LONGER COVERS ACCUCHEK.   rosuvastatin (CRESTOR) 10 MG tablet TAKE 1 TABLET(10 MG) BY MOUTH DAILY   [DISCONTINUED] insulin degludec (TRESIBA FLEXTOUCH) 100 UNIT/ML FlexTouch Pen Inject 14 Units into the skin daily. (Patient taking differently: Inject 12 Units into the skin daily with supper.)   [DISCONTINUED] losartan (COZAAR) 100 MG tablet TAKE 1 TABLET(100 MG) BY MOUTH AT BEDTIME   insulin degludec (TRESIBA FLEXTOUCH) 100 UNIT/ML  FlexTouch Pen Inject 16 Units into the skin daily.   losartan (COZAAR) 100 MG tablet TAKE 1 TABLET(100 MG) BY MOUTH AT BEDTIME   No facility-administered encounter medications on file as of 10/16/2022.     Lab Results  Component Value Date   WBC 5.9 08/03/2022   HGB 11.5 (L) 08/03/2022   HCT 35.0 (L) 08/03/2022   PLT 221.0 08/03/2022   GLUCOSE 119 (H) 10/12/2022   CHOL 127 10/12/2022   TRIG 102.0 10/12/2022   HDL 50.10 10/12/2022   LDLDIRECT 74.0 04/19/2019   LDLCALC 57 10/12/2022   ALT 11 10/12/2022   AST 18 10/12/2022   NA 142 10/12/2022   K 4.2 10/12/2022   CL 104 10/12/2022   CREATININE 0.90 10/12/2022   BUN 14 10/12/2022   CO2 30 10/12/2022   TSH 2.021 07/02/2022   INR 1.0 05/30/2022   HGBA1C 7.3 (H) 10/12/2022   MICROALBUR 1.4 04/16/2022       Assessment & Plan:   Problem List Items Addressed This Visit     Aneurysm of anterior communicating artery    Aneurysm of anterior communicating artery - vascular surgery (08/20/21) .  Underwent diagnostic cerebral angiogram with no recurrent aneurysm.  Recommended one year MRI angiogram - follow up.  Schedule f/u with neurology/vascular. Order placed for referral.  Need to confirm appt scheduled.       Relevant Medications   losartan (COZAAR) 100 MG tablet   Asthma    Breathing stable.       Depression, major, single episode, mild (HCC)    Increased stress as outlined.  Follow. Has good support.       GERD (gastroesophageal reflux disease)    Continue omeprazole.       History of CVA (cerebrovascular accident)    Embolic stroke following diagnostic cerebral angiogram for known ant communicating artery aneurysm s/p embolization.  MRA head and neck were negative.  ECHO - no evidence of embolic source. With history of significant GI bleed in the past due to AVM, polyp bleed, still requiring intermittent IV transfusion, so at this point she was not started on antiplatelet therapy.      Hypercholesterolemia     Continue crestor. Follow lipid panel and liver function tests.        Relevant Medications   losartan (COZAAR) 100 MG tablet   Other Relevant Orders   Hepatic function panel   Lipid Profile   Hypertension - Primary    On amlodipine and losartan.  Follow pressures.  Follow metabolic panel.       Relevant Medications   losartan (COZAAR) 100 MG tablet   Other Relevant Orders   Basic Metabolic Panel (BMET)   Inflammatory polyps of colon ([place])    S/p colonoscopy 07/24/21 as outlined.  Continues f/u with GI.  Started entyvio.  Is helping.  Follow.       Meningioma ([place])    Saw neurology 03/10/22 - recommended MRI in 6 months. Due. Confirm f/u with neurology.       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  Seeing neurology.        Psoriatic arthritis ([place])    Stable.  Has been followed by rheumatology.        Type 2 diabetes mellitus with hyperglycemia (HCC)    On insulin.  Follow sugars. Follow met b and A1c.  Last A1c increased.  Low carb diet and exercise.  Hold on additional medication.        Relevant Medications   insulin degludec (TRESIBA FLEXTOUCH) 100 UNIT/ML FlexTouch Pen   losartan (COZAAR) 100 MG tablet   Other Visit Diagnoses     Type 2 diabetes mellitus with diabetic nephropathy, without long-term current use of insulin (HCC)       Relevant Medications   insulin degludec (TRESIBA FLEXTOUCH) 100 UNIT/ML FlexTouch Pen   losartan (COZAAR) 100 MG tablet   Other Relevant Orders   HgB A1c       Einar Pheasant, MD

## 2022-10-19 ENCOUNTER — Ambulatory Visit: Payer: PPO | Admitting: Physical Therapy

## 2022-10-21 ENCOUNTER — Inpatient Hospital Stay: Payer: PPO | Attending: Oncology

## 2022-10-21 ENCOUNTER — Ambulatory Visit: Payer: PPO | Admitting: Physical Therapy

## 2022-10-28 ENCOUNTER — Ambulatory Visit: Payer: PPO | Admitting: Physical Therapy

## 2022-10-30 ENCOUNTER — Encounter: Payer: Self-pay | Admitting: Internal Medicine

## 2022-10-30 NOTE — Assessment & Plan Note (Addendum)
Saw neurology 03/10/22 - recommended MRI in 6 months. Due. Confirm f/u with neurology.

## 2022-10-30 NOTE — Assessment & Plan Note (Signed)
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  Underwent diagnostic cerebral angiogram with no recurrent aneurysm.  Recommended one year MRI angiogram - follow up. Schedule f/u with neurology/vascular. Order placed for referral.  Need to confirm appt scheduled.

## 2022-10-30 NOTE — Assessment & Plan Note (Signed)
MRI as outlined.  Seeing neurology.

## 2022-10-30 NOTE — Assessment & Plan Note (Signed)
S/p colonoscopy 07/24/21 as outlined.  Continues f/u with GI.  Started entyvio.  Is helping.  Follow.  

## 2022-10-30 NOTE — Assessment & Plan Note (Signed)
Increased stress as outlined.  Follow. Has good support.

## 2022-10-30 NOTE — Assessment & Plan Note (Addendum)
On insulin.  Follow sugars. Follow met b and A1c.  Last A1c increased.  Low carb diet and exercise.  Hold on additional medication.

## 2022-10-30 NOTE — Assessment & Plan Note (Signed)
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  MRA head and neck were negative.  ECHO - no evidence of embolic source. With history of significant GI bleed in the past due to AVM, polyp bleed, still requiring intermittent IV transfusion, so at this point she was not started on antiplatelet therapy. 

## 2022-10-30 NOTE — Assessment & Plan Note (Signed)
On amlodipine and losartan.  Follow pressures.  Follow metabolic panel.  

## 2022-10-30 NOTE — Assessment & Plan Note (Signed)
Breathing stable.

## 2022-10-30 NOTE — Assessment & Plan Note (Signed)
Stable.  Has been followed by rheumatology.   

## 2022-10-30 NOTE — Assessment & Plan Note (Signed)
Continue crestor.  Follow lipid panel and liver function tests.  

## 2022-10-30 NOTE — Assessment & Plan Note (Signed)
Continue omeprazole 

## 2022-10-31 ENCOUNTER — Other Ambulatory Visit: Payer: Self-pay | Admitting: Internal Medicine

## 2022-11-03 NOTE — Telephone Encounter (Signed)
Please confirm with pt if she is taking celexa.  Received request from pharmacy.  Thanks

## 2022-11-03 NOTE — Telephone Encounter (Signed)
Is it okay to refuse? Medication was discontinued on 08/29/2021.

## 2022-11-05 NOTE — Telephone Encounter (Signed)
Per pt - is not using celexa or lexapro .. - did not request refill, does not want refill.

## 2022-11-05 NOTE — Telephone Encounter (Signed)
LM for pt to cb 

## 2022-11-18 DIAGNOSIS — L600 Ingrowing nail: Secondary | ICD-10-CM | POA: Diagnosis not present

## 2022-11-18 DIAGNOSIS — L909 Atrophic disorder of skin, unspecified: Secondary | ICD-10-CM | POA: Diagnosis not present

## 2022-11-18 DIAGNOSIS — Q6689 Other  specified congenital deformities of feet: Secondary | ICD-10-CM | POA: Diagnosis not present

## 2022-11-18 DIAGNOSIS — M778 Other enthesopathies, not elsewhere classified: Secondary | ICD-10-CM | POA: Diagnosis not present

## 2022-11-19 DIAGNOSIS — I729 Aneurysm of unspecified site: Secondary | ICD-10-CM | POA: Diagnosis not present

## 2022-11-19 DIAGNOSIS — D329 Benign neoplasm of meninges, unspecified: Secondary | ICD-10-CM | POA: Diagnosis not present

## 2022-11-19 DIAGNOSIS — G4700 Insomnia, unspecified: Secondary | ICD-10-CM | POA: Diagnosis not present

## 2022-11-19 DIAGNOSIS — Z8673 Personal history of transient ischemic attack (TIA), and cerebral infarction without residual deficits: Secondary | ICD-10-CM | POA: Diagnosis not present

## 2022-11-20 ENCOUNTER — Inpatient Hospital Stay: Payer: PPO | Attending: Oncology

## 2022-11-27 ENCOUNTER — Ambulatory Visit (INDEPENDENT_AMBULATORY_CARE_PROVIDER_SITE_OTHER): Payer: PPO | Admitting: Internal Medicine

## 2022-11-27 ENCOUNTER — Encounter: Payer: Self-pay | Admitting: Internal Medicine

## 2022-11-27 VITALS — BP 130/72 | HR 88 | Temp 97.8°F | Resp 16 | Ht 64.0 in | Wt 132.0 lb

## 2022-11-27 DIAGNOSIS — E7800 Pure hypercholesterolemia, unspecified: Secondary | ICD-10-CM | POA: Diagnosis not present

## 2022-11-27 DIAGNOSIS — F439 Reaction to severe stress, unspecified: Secondary | ICD-10-CM

## 2022-11-27 DIAGNOSIS — E1121 Type 2 diabetes mellitus with diabetic nephropathy: Secondary | ICD-10-CM

## 2022-11-27 DIAGNOSIS — I671 Cerebral aneurysm, nonruptured: Secondary | ICD-10-CM

## 2022-11-27 DIAGNOSIS — L4050 Arthropathic psoriasis, unspecified: Secondary | ICD-10-CM | POA: Diagnosis not present

## 2022-11-27 DIAGNOSIS — Z794 Long term (current) use of insulin: Secondary | ICD-10-CM

## 2022-11-27 DIAGNOSIS — J4520 Mild intermittent asthma, uncomplicated: Secondary | ICD-10-CM

## 2022-11-27 DIAGNOSIS — K219 Gastro-esophageal reflux disease without esophagitis: Secondary | ICD-10-CM | POA: Diagnosis not present

## 2022-11-27 DIAGNOSIS — K51419 Inflammatory polyps of colon with unspecified complications: Secondary | ICD-10-CM | POA: Diagnosis not present

## 2022-11-27 DIAGNOSIS — E1165 Type 2 diabetes mellitus with hyperglycemia: Secondary | ICD-10-CM | POA: Diagnosis not present

## 2022-11-27 DIAGNOSIS — Z8673 Personal history of transient ischemic attack (TIA), and cerebral infarction without residual deficits: Secondary | ICD-10-CM | POA: Diagnosis not present

## 2022-11-27 DIAGNOSIS — E237 Disorder of pituitary gland, unspecified: Secondary | ICD-10-CM | POA: Diagnosis not present

## 2022-11-27 DIAGNOSIS — I10 Essential (primary) hypertension: Secondary | ICD-10-CM | POA: Diagnosis not present

## 2022-11-27 DIAGNOSIS — F320 Major depressive disorder, single episode, mild: Secondary | ICD-10-CM

## 2022-11-27 DIAGNOSIS — D329 Benign neoplasm of meninges, unspecified: Secondary | ICD-10-CM | POA: Diagnosis not present

## 2022-11-27 DIAGNOSIS — I729 Aneurysm of unspecified site: Secondary | ICD-10-CM

## 2022-11-27 NOTE — Progress Notes (Unsigned)
Subjective:    Patient ID: Ashley Gross, female    DOB: 1951/06/19, 72 y.o.   MRN: [id number]  Patient here for  Chief Complaint  Patient presents with   Medical Management of Chronic Issues    HPI Here to follow up regarding her blood pressure, blood sugar and cholesterol.  Persistent increased stress.  Has moved.  Issues with buying the house and other financial issues.  Discussed.  Does not feel needs any further intervention at this time.  Some increased stress today with her dog.  No chest pain or sob reported.  Breathing overall stable.  No increased heart rate or palpitations.  GI symptoms - relatively stable.  Seeing Dr Maryruth Bun.  Receiving entyvio.  States am sugars 80-90s and pm sugars mostly <200.     Past Medical History:  Diagnosis Date   Abnormal liver function test 10/06/2012   Anemia 01/25/2022   Anemia, unspecified    Asthma    B12 deficiency 02/11/2019   Bleeding internal hemorrhoids 09/13/2017   Choledocholithiasis 06/29/2012   Formatting of this note might be different from the original. Dilated CBD on abdominal imaging 04/2012   Chronic diarrhea 02/16/2013   Colitis    Complication of anesthesia    asthma attack after shoulder surgery   Diabetes mellitus ([place])    type II   Esophagitis    GERD (gastroesophageal reflux disease)    Heart murmur    for years- nothing to be concerned about.   History of kidney stones    Hx of arteriovenous malformation (AVM)    Hypertension    IBS (irritable bowel syndrome)    Loss of weight    Pernicious anemia    Personal history of colonic polyps 02/10/2013   02/08/13 colonoscopy - question of rectal varices, two 3-30m polyps in the sigmoid colon, mass at the hepatic flexure, one 561mpolyp at the hepatic flexure, nodule at the ileocecal valve, congested mucusa in the entire colon, flattened villi mucosa in the terminal ileum.     Pneumonia    Psoriatic arthritis ([place]   s/p penicillamine, plaquenil, MTX, sulfasalazine.   s/p Embrel, Humira,.  Iritis.     Pure hypercholesterolemia    Rectal bleeding 07/15/2017   Stroke ([facility]07/28/2023   per patient   Past Surgical History:  Procedure Laterality Date   ABDOMINAL HYSTERECTOMY  1981   ovaries left in place   ANKLE SURGERY     right   BUNIONECTOMY     CHOLECYSTECTOMY  1989   COLONOSCOPY WITH PROPOFOL N/A 12/27/2017   Procedure: COLONOSCOPY WITH PROPOFOL;  Surgeon: VaLin LandsmanMD;  [facility];  Service: Gastroenterology;  Laterality: N/A;   COLONOSCOPY WITH PROPOFOL N/A 01/04/2019   Procedure: COLONOSCOPY WITH PROPOFOL;  Surgeon: VaLin LandsmanMD;  [place]: [place];  Laterality: N/A;  Diabetic - oral and injectable   COLONOSCOPY WITH PROPOFOL N/A 07/24/2021   Procedure: COLONOSCOPY WITH PROPOFOL;  Surgeon: VaLin LandsmanMD;  [place] Service: Endoscopy;  Laterality: N/A;  Diabetic   ESOPHAGOGASTRODUODENOSCOPY (EGD) WITH PROPOFOL N/A 12/27/2017   Procedure: ESOPHAGOGASTRODUODENOSCOPY (EGD) WITH PROPOFOL;  Surgeon: VaLin LandsmanMD;  [facility];  Service: Gastroenterology;  Laterality: N/A;   HAND SURGERY     right   HEEL SPUR SURGERY     IR 3D INDEPENDENT WKST  10/01/2021   IR ANGIO INTRA EXTRACRAN SEL COM CAROTID INNOMINATE UNI L MOD SED  10/01/2021   IR ANGIO INTRA EXTRACRAN SEL INTERNAL CAROTID UNI R MOD SED  10/01/2021   IR ANGIO INTRA EXTRACRAN SEL INTERNAL CAROTID UNI R MOD SED  05/29/2022   IR ANGIO VERTEBRAL SEL VERTEBRAL BILAT MOD SED  10/01/2021   IR ANGIO VERTEBRAL SEL VERTEBRAL UNI R MOD SED  05/29/2022   IR CT HEAD LTD  10/01/2021   IR TRANSCATH/EMBOLIZ  10/01/2021   IR US GUIDE VASC ACCESS RIGHT  10/01/2021   IR US GUIDE VASC ACCESS RIGHT  05/29/2022   NOSE SURGERY     x2   POLYPECTOMY  01/04/2019   Procedure: POLYPECTOMY;  Surgeon: Lin Landsman, MD;  Location: [place];  Service: Endoscopy;;   POLYPECTOMY N/A  07/24/2021   Procedure: POLYPECTOMY;  Surgeon: Lin Landsman, MD;  Location: [place];  Service: Endoscopy;  Laterality: N/A;   RADIOLOGY WITH ANESTHESIA N/A 10/01/2021   Procedure: Starr Sinclair WITH ANESTHESIA;  Surgeon: Pedro Earls, MD;  Location: [place];  Service: Radiology;  Laterality: N/A;   SHOULDER ARTHROSCOPY WITH OPEN ROTATOR CUFF REPAIR Left 09/20/2018   Procedure: SHOULDER ARTHROSCOPY WITH OPEN ROTATOR CUFF REPAIR;  Surgeon: Corky Mull, MD;  Location[facility];  Service: Orthopedics;  Laterality: Left;   SHOULDER CLOSED REDUCTION Left 12/21/2018   Procedure: MANIPULATION UNDER ANESTHESIA WITH STEROID INJECTION;  Surgeon: Corky Mull, MD;  Location: [place];  Service: Orthopedics;  Laterality: Left;  Diabetic - insulin and oral meds   UMBILICAL HERNIA REPAIR     XI ROBOTIC ASSISTED INGUINAL HERNIA REPAIR WITH MESH Left 12/16/2021   Procedure: XI ROBOTIC ASSISTED INGUINAL HERNIA REPAIR WITH MESH;  Surgeon: Olean Ree, MD;  Location: [facility];  Service: General;  Laterality: Left;   Family History  Problem Relation Age of Onset   Diabetes Other    Hypertension Other    Breast cancer Neg Hx    Colon cancer Neg Hx    Social History   Socioeconomic History   Marital status: Widowed    Spouse name: Not on file   Number of children: 2   Years of education: Not on file   Highest education level: Not on file  Occupational History   Not on file  Tobacco Use   Smoking status: Never   Smokeless tobacco: Never  Vaping Use   Vaping Use: Never used  Substance and Sexual Activity   Alcohol use: Never   Drug use: Never   Sexual activity: Not Currently  Other Topics Concern   Not on file  Social History Narrative   She is married and has two children   [place] daughter lives with her.   Social Determinants of Health   Financial Resource Strain: Low Risk  (05/13/2022)   Overall Financial Resource Strain (CARDIA)    Difficulty of  Paying Living Expenses: Not hard at all  Food Insecurity: No Food Insecurity (05/13/2022)   Hunger Vital Sign    Worried About Running Out of Food in the Last Year: Never true    Ran Out of Food in the Last Year: Never true  Transportation Needs: No Transportation Needs (05/13/2022)   PRAPARE - Hydrologist (Medical): No    Lack of Transportation (Non-Medical): No  Physical Activity: Not on file  Stress: No Stress Concern Present (05/13/2022)   [place]    Feeling of Stress : Not at all  Social Connections: Unknown (01/07/2021)  Social Licensed conveyancer [NHANES]    Frequency of Communication with Friends and Family: More than three times a week    Frequency of Social Gatherings with Friends and Family: Not on file    Attends Religious Services: Not on file    Active Member of Clubs or Organizations: Not on file    Attends Archivist Meetings: Not on file    Marital Status: Not on file     Review of Systems  Constitutional:  Negative for appetite change and unexpected weight change.  HENT:  Negative for congestion and sinus pressure.   Respiratory:  Negative for cough, chest tightness and shortness of breath.   Cardiovascular:  Negative for chest pain, palpitations and leg swelling.  Gastrointestinal:  Negative for abdominal pain, diarrhea, nausea and vomiting.  Genitourinary:  Negative for difficulty urinating and dysuria.  Musculoskeletal:  Negative for joint swelling and myalgias.  Skin:  Negative for color change and rash.  Neurological:  Negative for dizziness and headaches.  Psychiatric/Behavioral:  Negative for agitation and dysphoric mood.        Increased stress as outlined.        Objective:     BP 130/72   Pulse 88   Temp 97.8 F (36.6 C)   Resp 16   Wt 132 lb (59.9 kg)   SpO2 98%   BMI 22.66 kg/m  Wt Readings from Last 3 Encounters:   11/27/22 132 lb (59.9 kg)  10/16/22 125 lb 12.8 oz (57.1 kg)  10/14/22 128 lb (58.1 kg)    Physical Exam Vitals reviewed.  Constitutional:      General: She is not in acute distress.    Appearance: Normal appearance.  HENT:     Head: Normocephalic and atraumatic.     Right Ear: External ear normal.     Left Ear: External ear normal.  Eyes:     General: No scleral icterus.       Right eye: No discharge.        Left eye: No discharge.     Conjunctiva/sclera: Conjunctivae normal.  Neck:     Thyroid: No thyromegaly.  Cardiovascular:     Rate and Rhythm: Normal rate and regular rhythm.  Pulmonary:     Effort: No respiratory distress.     Breath sounds: Normal breath sounds. No wheezing.  Abdominal:     General: Bowel sounds are normal.     Palpations: Abdomen is soft.     Tenderness: There is no abdominal tenderness.  Musculoskeletal:        General: No swelling or tenderness.     Cervical back: Neck supple. No tenderness.  Lymphadenopathy:     Cervical: No cervical adenopathy.  Skin:    Findings: No erythema or rash.  Neurological:     Mental Status: She is alert.  Psychiatric:        Mood and Affect: Mood normal.        Behavior: Behavior normal.      Outpatient Encounter Medications as of 11/27/2022  Medication Sig   acetaminophen (TYLENOL) 500 MG tablet Take 2 tablets (1,000 mg total) by mouth every 6 (six) hours as needed for mild pain.   albuterol (PROVENTIL) (2.5 MG/3ML) 0.083% nebulizer solution Take 3 mLs (2.5 mg total) by nebulization every 4 (four) hours as needed for wheezing or shortness of breath.   albuterol (VENTOLIN HFA) 108 (90 Base) MCG/ACT inhaler Inhale 2 puffs into the lungs every 6 (six) hours as  needed for wheezing or shortness of breath.   amLODipine (NORVASC) 5 MG tablet TAKE 1 TABLET(5 MG) BY MOUTH TWICE DAILY   budesonide-formoterol (SYMBICORT) 160-4.5 MCG/ACT inhaler Inhale 2 puffs into the lungs 2 (two) times daily. (Patient taking  differently: Inhale 2 puffs into the lungs 2 (two) times daily as needed (shortness of breath).)   Cholecalciferol (VITAMIN D) 50 MCG (2000 UT) tablet Take 2,000 Units by mouth daily.   CREON 36000-114000 units CPEP capsule Take 1-2 capsules (36,000-72,000 Units total) by mouth See admin instructions. Take 720000 units with each meal and 36000 units with snacks   diphenhydramine-acetaminophen (TYLENOL PM) 25-500 MG TABS tablet Take 2 tablets by mouth at bedtime as needed (sleep).   fluticasone (FLOVENT HFA) 110 MCG/ACT inhaler Inhale 2 puffs into the lungs 2 (two) times daily as needed (shortness of breath).   gabapentin (NEURONTIN) 100 MG capsule Take 400 mg by mouth at bedtime.   insulin degludec (TRESIBA FLEXTOUCH) 100 UNIT/ML FlexTouch Pen Inject 16 Units into the skin daily.   loperamide (IMODIUM) 2 MG capsule Take 4 mg by mouth daily.   losartan (COZAAR) 100 MG tablet TAKE 1 TABLET(100 MG) BY MOUTH AT BEDTIME   Melatonin 10 MG TABS Take 10 mg by mouth at bedtime.   omeprazole (PRILOSEC) 20 MG capsule TAKE 1 CAPSULE(20 MG) BY MOUTH TWICE DAILY   ONE TOUCH ULTRA TEST test strip PATIENT NEEDS NEW METER STRIPS AND LANCETS FOR ONE TOUCH. HER INSURANCE NO LONGER COVERS ACCUCHEK.   rosuvastatin (CRESTOR) 10 MG tablet TAKE 1 TABLET(10 MG) BY MOUTH DAILY   No facility-administered encounter medications on file as of 11/27/2022.     Lab Results  Component Value Date   WBC 5.9 08/03/2022   HGB 11.5 (L) 08/03/2022   HCT 35.0 (L) 08/03/2022   PLT 221.0 08/03/2022   GLUCOSE 119 (H) 10/12/2022   CHOL 127 10/12/2022   TRIG 102.0 10/12/2022   HDL 50.10 10/12/2022   LDLDIRECT 74.0 04/19/2019   LDLCALC 57 10/12/2022   ALT 11 10/12/2022   AST 18 10/12/2022   NA 142 10/12/2022   K 4.2 10/12/2022   CL 104 10/12/2022   CREATININE 0.90 10/12/2022   BUN 14 10/12/2022   CO2 30 10/12/2022   TSH 2.021 07/02/2022   INR 1.0 05/30/2022   HGBA1C 7.3 (H) 10/12/2022   MICROALBUR 1.4 04/16/2022    No  results found.     Assessment & Plan:  Primary hypertension Assessment & Plan: On amlodipine and losartan.  Follow pressures.  Follow metabolic panel.    Hypercholesterolemia Assessment & Plan: Continue crestor. Follow lipid panel and liver function tests.     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  MRA head and neck were negative.  ECHO - no evidence of embolic source. With history of significant GI bleed in the past due to AVM, polyp bleed, still requiring intermittent IV transfusion, so at this point she was not started on antiplatelet therapy.   Mild intermittent asthma without complication Assessment & Plan: Breathing stable.    Depression, major, single episode, mild (HCC) Assessment & Plan: Increased stress as outlined.  Follow. Has good support. Notify if feels needs further intervention.    Gastroesophageal reflux disease, unspecified whether esophagitis present Assessment & Plan: Continue omeprazole.    Pseudopolyposis of colon with complication, unspecified part of colon [facility]) Assessment & Plan: S/p colonoscopy 07/24/21 as outlined.  Continues  f/u with GI.  Started entyvio.  Is helping.  Follow.    Meningioma [facility]) Assessment & Plan: Saw neurology 03/10/22 - recommended MRI in 6 months. Due. Confirm if scheduled.     Pituitary lesion [facility]) Assessment & Plan: MRI as outlined.  Seeing neurology.     Psoriatic arthritis ([place]) Assessment & Plan: Stable.  Has been followed by rheumatology.     Stress Assessment & Plan: Increased stress with the move and underlying medical issues and financial issues.  Discussed.  Has good support.  Follow. Notify if feels needs further intervention.    Type 2 diabetes mellitus with hyperglycemia, with long-term current use of insulin ([place]) Assessment & Plan: On insulin.  Follow  sugars. Follow met b and A1c. Low carb diet and exercise.    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  Underwent diagnostic cerebral angiogram with no recurrent aneurysm.  Recommended one year MRI angiogram - follow up. Had f/u with neurology.  Need to confirm if f/u scan warranted.     Aneurysm ([place])  Type 2 diabetes mellitus with diabetic nephropathy, without long-term current use of insulin ([place]) Assessment & Plan: On insulin.  Follow sugars. Follow met b and A1c. Low carb diet and exercise.       Einar Pheasant, MD

## 2022-11-28 ENCOUNTER — Encounter: Payer: Self-pay | Admitting: Internal Medicine

## 2022-11-28 DIAGNOSIS — I729 Aneurysm of unspecified site: Secondary | ICD-10-CM | POA: Insufficient documentation

## 2022-11-28 DIAGNOSIS — E1121 Type 2 diabetes mellitus with diabetic nephropathy: Secondary | ICD-10-CM | POA: Insufficient documentation

## 2022-11-28 NOTE — Assessment & Plan Note (Signed)
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  MRA head and neck were negative.  ECHO - no evidence of embolic source. With history of significant GI bleed in the past due to AVM, polyp bleed, still requiring intermittent IV transfusion, so at this point she was not started on antiplatelet therapy. 

## 2022-11-28 NOTE — Assessment & Plan Note (Signed)
On insulin.  Follow sugars. Follow met b and A1c. Low carb diet and exercise.  

## 2022-11-28 NOTE — Assessment & Plan Note (Signed)
Continue crestor.  Follow lipid panel and liver function tests.  

## 2022-11-28 NOTE — Assessment & Plan Note (Signed)
S/p colonoscopy 07/24/21 as outlined.  Continues f/u with GI.  Started entyvio.  Is helping.  Follow.  

## 2022-11-28 NOTE — Assessment & Plan Note (Signed)
On amlodipine and losartan.  Follow pressures.  Follow metabolic panel.  

## 2022-11-28 NOTE — Assessment & Plan Note (Signed)
Increased stress as outlined.  Follow. Has good support. Notify if feels needs further intervention.

## 2022-11-28 NOTE — Assessment & Plan Note (Signed)
MRI as outlined.  Seeing neurology.

## 2022-11-28 NOTE — Assessment & Plan Note (Signed)
Increased stress with the move and underlying medical issues and financial issues.  Discussed.  Has good support.  Follow. Notify if feels needs further intervention.  

## 2022-11-28 NOTE — Assessment & Plan Note (Signed)
Continue omeprazole 

## 2022-11-28 NOTE — Assessment & Plan Note (Signed)
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  Underwent diagnostic cerebral angiogram with no recurrent aneurysm.  Recommended one year MRI angiogram - follow up. Had f/u with neurology.  Need to confirm if f/u scan warranted.

## 2022-11-28 NOTE — Assessment & Plan Note (Signed)
Stable.  Has been followed by rheumatology.   

## 2022-11-28 NOTE — Assessment & Plan Note (Signed)
Breathing stable.

## 2022-11-28 NOTE — Assessment & Plan Note (Signed)
Saw neurology 03/10/22 - recommended MRI in 6 months. Due. Confirm if scheduled.

## 2022-12-04 ENCOUNTER — Encounter
Admission: RE | Admit: 2022-12-04 | Discharge: 2022-12-04 | Disposition: A | Discharge status: Home / Self Care | Payer: PPO | Source: Ambulatory Visit | Attending: Internal Medicine | Admitting: Internal Medicine

## 2022-12-04 DIAGNOSIS — K51411 Inflammatory polyps of colon with rectal bleeding: Secondary | ICD-10-CM | POA: Insufficient documentation

## 2022-12-04 MED ORDER — DIPHENHYDRAMINE HCL 25 MG PO CAPS
25.0000 mg | ORAL_CAPSULE | Freq: Once | ORAL | Status: AC
  Administered 2022-12-04: 25 mg via ORAL

## 2022-12-04 MED ORDER — DIPHENHYDRAMINE HCL 25 MG PO CAPS
ORAL_CAPSULE | ORAL | Status: AC
  Filled 2022-12-04: qty 1

## 2022-12-04 MED ORDER — VEDOLIZUMAB 300 MG IV SOLR
300.0000 mg | Freq: Once | INTRAVENOUS | Status: AC
  Administered 2022-12-04: 300 mg via INTRAVENOUS
  Filled 2022-12-04: qty 5

## 2022-12-04 MED ORDER — METHYLPREDNISOLONE SODIUM SUCC 40 MG IJ SOLR
INTRAMUSCULAR | Status: AC
  Filled 2022-12-04: qty 1

## 2022-12-04 MED ORDER — METHYLPREDNISOLONE SODIUM SUCC 40 MG IJ SOLR: 10.0000 mg | Freq: Once | INTRAMUSCULAR | Status: DC

## 2022-12-04 MED ORDER — ACETAMINOPHEN 325 MG PO TABS
ORAL_TABLET | ORAL | Status: AC
  Filled 2022-12-04: qty 2

## 2022-12-04 MED ORDER — ACETAMINOPHEN 325 MG PO TABS
650.0000 mg | ORAL_TABLET | Freq: Once | ORAL | Status: AC
  Administered 2022-12-04: 650 mg via ORAL

## 2022-12-04 NOTE — Progress Notes (Signed)
Medication iv prednisone not given. Benadryl and tylenol given but not ordered and reported to provider. Provider is aware.

## 2022-12-04 NOTE — Progress Notes (Deleted)
Medications not given

## 2022-12-16 ENCOUNTER — Telehealth: Payer: Self-pay | Admitting: Internal Medicine

## 2022-12-16 DIAGNOSIS — I671 Cerebral aneurysm, nonruptured: Secondary | ICD-10-CM

## 2022-12-16 DIAGNOSIS — D329 Benign neoplasm of meninges, unspecified: Secondary | ICD-10-CM

## 2022-12-16 NOTE — Telephone Encounter (Signed)
Please notify Ashley Gross that I spoke to neurology and reviewed her history and information.  Given her history of anterior communicating aneurysm and meningioma - it is recommended for her to f/u with neurosurgery - to monitor and follow for any change.  If agreeable, I would like to refer her to Dr Izora Ribas - neurosurgery in [place].

## 2022-12-17 NOTE — Addendum Note (Signed)
Addended by: Roetta Sessions D on: 12/17/2022 01:24 PM   Modules accepted: Orders

## 2022-12-17 NOTE — Telephone Encounter (Signed)
Order signed for [facility] referral.

## 2022-12-17 NOTE — Telephone Encounter (Signed)
Spoke with patient. Agreeable to referral. I have pended referral- just need you to review and make sure all info is entered and correct.

## 2022-12-17 NOTE — Addendum Note (Signed)
Addended by: Alisa Graff on: 12/17/2022 04:32 PM   Modules accepted: Orders

## 2022-12-21 ENCOUNTER — Other Ambulatory Visit (HOSPITAL_COMMUNITY)
Admission: RE | Admit: 2022-12-21 | Discharge: 2022-12-21 | Disposition: A | Discharge status: Home / Self Care | Payer: PPO | Source: Ambulatory Visit | Attending: Internal Medicine | Admitting: Internal Medicine

## 2022-12-21 ENCOUNTER — Other Ambulatory Visit: Payer: Self-pay

## 2022-12-21 ENCOUNTER — Encounter: Payer: Self-pay | Admitting: Internal Medicine

## 2022-12-21 ENCOUNTER — Ambulatory Visit (INDEPENDENT_AMBULATORY_CARE_PROVIDER_SITE_OTHER): Payer: PPO | Admitting: Internal Medicine

## 2022-12-21 VITALS — BP 132/80 | HR 86 | Temp 97.6°F | Resp 16 | Ht 64.0 in | Wt 136.6 lb

## 2022-12-21 DIAGNOSIS — I10 Essential (primary) hypertension: Secondary | ICD-10-CM

## 2022-12-21 DIAGNOSIS — N760 Acute vaginitis: Secondary | ICD-10-CM

## 2022-12-21 DIAGNOSIS — I729 Aneurysm of unspecified site: Secondary | ICD-10-CM

## 2022-12-21 DIAGNOSIS — R300 Dysuria: Secondary | ICD-10-CM

## 2022-12-21 DIAGNOSIS — E1165 Type 2 diabetes mellitus with hyperglycemia: Secondary | ICD-10-CM

## 2022-12-21 DIAGNOSIS — K219 Gastro-esophageal reflux disease without esophagitis: Secondary | ICD-10-CM

## 2022-12-21 DIAGNOSIS — F439 Reaction to severe stress, unspecified: Secondary | ICD-10-CM

## 2022-12-21 DIAGNOSIS — D329 Benign neoplasm of meninges, unspecified: Secondary | ICD-10-CM

## 2022-12-21 DIAGNOSIS — K51419 Inflammatory polyps of colon with unspecified complications: Secondary | ICD-10-CM | POA: Diagnosis not present

## 2022-12-21 DIAGNOSIS — F320 Major depressive disorder, single episode, mild: Secondary | ICD-10-CM

## 2022-12-21 DIAGNOSIS — E7800 Pure hypercholesterolemia, unspecified: Secondary | ICD-10-CM | POA: Diagnosis not present

## 2022-12-21 DIAGNOSIS — E237 Disorder of pituitary gland, unspecified: Secondary | ICD-10-CM

## 2022-12-21 DIAGNOSIS — I671 Cerebral aneurysm, nonruptured: Secondary | ICD-10-CM

## 2022-12-21 DIAGNOSIS — Z8673 Personal history of transient ischemic attack (TIA), and cerebral infarction without residual deficits: Secondary | ICD-10-CM

## 2022-12-21 DIAGNOSIS — R350 Frequency of micturition: Secondary | ICD-10-CM

## 2022-12-21 DIAGNOSIS — Z86010 Personal history of colonic polyps: Secondary | ICD-10-CM | POA: Diagnosis not present

## 2022-12-21 DIAGNOSIS — L4050 Arthropathic psoriasis, unspecified: Secondary | ICD-10-CM

## 2022-12-21 DIAGNOSIS — Z794 Long term (current) use of insulin: Secondary | ICD-10-CM

## 2022-12-21 LAB — URINALYSIS, ROUTINE W REFLEX MICROSCOPIC
Bilirubin Urine: NEGATIVE
Hgb urine dipstick: NEGATIVE
Ketones, ur: NEGATIVE
Nitrite: POSITIVE — AB
Specific Gravity, Urine: 1.02 (ref 1.000–1.030)
Total Protein, Urine: NEGATIVE
Urine Glucose: NEGATIVE
Urobilinogen, UA: 0.2 (ref 0.0–1.0)
pH: 6 (ref 5.0–8.0)

## 2022-12-21 MED ORDER — CEFDINIR 300 MG PO CAPS: 300.0000 mg | ORAL_CAPSULE | Freq: Two times a day (BID) | ORAL | 0 refills | Status: DC

## 2022-12-21 MED ORDER — NYSTATIN 100000 UNIT/GM EX CREA: 1.0000 | TOPICAL_CREAM | Freq: Two times a day (BID) | CUTANEOUS | 0 refills | Status: DC

## 2022-12-21 NOTE — Progress Notes (Signed)
Subjective:    Patient ID: Ashley Gross, female    DOB: Dec 23, 1950, 72 y.o.   MRN: [id number]  Patient here for  Chief Complaint  Patient presents with   Vaginal Itching   Urinary Frequency   Dysuria    HPI Work in appt.  Work in with concerns regarding pain - noticed after urinating.  Also increased nocturia.  Some urgency. Increased frequency.  No abdominal pain or back pain.  No fever.  She is eating and drinking.  No vomiting.  Initially used one dose of monistat - did not help.  Got monistat 7.  Still some itching - peri vaginal area.  Worse at night.  Has stopped the disposable underwear and pads - to see if this would help.  Rectal bleeding is better.  Taking imodium 2-4x/day.     Past Medical History:  Diagnosis Date   Abnormal liver function test 10/06/2012   Anemia 01/25/2022   Anemia, unspecified    Asthma    B12 deficiency 02/11/2019   Bleeding internal hemorrhoids 09/13/2017   Choledocholithiasis 06/29/2012   Formatting of this note might be different from the original. Dilated CBD on abdominal imaging 04/2012   Chronic diarrhea 02/16/2013   Colitis    Complication of anesthesia    asthma attack after shoulder surgery   Diabetes mellitus ([place])    type II   Esophagitis    GERD (gastroesophageal reflux disease)    Heart murmur    for years- nothing to be concerned about.   History of kidney stones    Hx of arteriovenous malformation (AVM)    Hypertension    IBS (irritable bowel syndrome)    Loss of weight    Pernicious anemia    Personal history of colonic polyps 02/10/2013   02/08/13 colonoscopy - question of rectal varices, two 3-59m polyps in the sigmoid colon, mass at the hepatic flexure, one 573mpolyp at the hepatic flexure, nodule at the ileocecal valve, congested mucusa in the entire colon, flattened villi mucosa in the terminal ileum.     Pneumonia    Psoriatic arthritis ([place]   s/p penicillamine, plaquenil, MTX, sulfasalazine.  s/p Embrel,  Humira,.  Iritis.     Pure hypercholesterolemia    Rectal bleeding 07/15/2017   [place]   per patient   Past Surgical History:  Procedure Laterality Date   ABDOMINAL HYSTERECTOMY  1981   ovaries left in place   ANKLE SURGERY     right   BUNIONECTOMY     CHOLECYSTECTOMY  1989   COLONOSCOPY WITH PROPOFOL N/A 12/27/2017   Procedure: COLONOSCOPY WITH PROPOFOL;  Surgeon: VaLin LandsmanMD;  [facility];  Service: Gastroenterology;  Laterality: N/A;   COLONOSCOPY WITH PROPOFOL N/A 01/04/2019   Procedure: COLONOSCOPY WITH PROPOFOL;  Surgeon: VaLin LandsmanMD;  [place];  Laterality: N/A;  Diabetic - oral and injectable   COLONOSCOPY WITH PROPOFOL N/A 07/24/2021   Procedure: COLONOSCOPY WITH PROPOFOL;  Surgeon: VaLin LandsmanMD;  [place]: [place];  Laterality: N/A;  Diabetic   ESOPHAGOGASTRODUODENOSCOPY (EGD) WITH PROPOFOL N/A 12/27/2017   Procedure: ESOPHAGOGASTRODUODENOSCOPY (EGD) WITH PROPOFOL;  Surgeon: VaLin LandsmanMD;  [facility];  Service: Gastroenterology;  Laterality: N/A;   HAND SURGERY     right   HEEL SPUR SURGERY     IR 3D INDEPENDENT WKST  10/01/2021   IR ANGIO INTRA EXTRACRAN SEL COM CAROTID  INNOMINATE UNI L MOD SED  10/01/2021   IR ANGIO INTRA EXTRACRAN SEL INTERNAL CAROTID UNI R MOD SED  10/01/2021   IR ANGIO INTRA EXTRACRAN SEL INTERNAL CAROTID UNI R MOD SED  05/29/2022   IR ANGIO VERTEBRAL SEL VERTEBRAL BILAT MOD SED  10/01/2021   IR ANGIO VERTEBRAL SEL VERTEBRAL UNI R MOD SED  05/29/2022   IR CT HEAD LTD  10/01/2021   IR TRANSCATH/EMBOLIZ  10/01/2021   IR US GUIDE VASC ACCESS RIGHT  10/01/2021   IR US GUIDE VASC ACCESS RIGHT  05/29/2022   NOSE SURGERY     x2   POLYPECTOMY  01/04/2019   Procedure: POLYPECTOMY;  Surgeon: Lin Landsman, MD;  Location: [place];  Service: Endoscopy;;   POLYPECTOMY N/A 07/24/2021    Procedure: POLYPECTOMY;  Surgeon: Lin Landsman, MD;  Location: [place];  Service: Endoscopy;  Laterality: N/A;   RADIOLOGY WITH ANESTHESIA N/A 10/01/2021   Procedure: Starr Sinclair WITH ANESTHESIA;  Surgeon: Pedro Earls, MD;  Location: [place];  Service: Radiology;  Laterality: N/A;   SHOULDER ARTHROSCOPY WITH OPEN ROTATOR CUFF REPAIR Left 09/20/2018   Procedure: SHOULDER ARTHROSCOPY WITH OPEN ROTATOR CUFF REPAIR;  Surgeon: Corky Mull, MD;  Location: [facility];  Service: Orthopedics;  Laterality: Left;   SHOULDER CLOSED REDUCTION Left 12/21/2018   Procedure: MANIPULATION UNDER ANESTHESIA WITH STEROID INJECTION;  Surgeon: Corky Mull, MD;  Location: [place];  Service: Orthopedics;  Laterality: Left;  Diabetic - insulin and oral meds   UMBILICAL HERNIA REPAIR     XI ROBOTIC ASSISTED INGUINAL HERNIA REPAIR WITH MESH Left 12/16/2021   Procedure: XI ROBOTIC ASSISTED INGUINAL HERNIA REPAIR WITH MESH;  Surgeon: Olean Ree, MD;  Location: [facility];  Service: General;  Laterality: Left;   Family History  Problem Relation Age of Onset   Diabetes Other    Hypertension Other    Breast cancer Neg Hx    Colon cancer Neg Hx    Social History   Socioeconomic History   Marital status: Widowed    Spouse name: Not on file   Number of children: 2   Years of education: Not on file   Highest education level: Not on file  Occupational History   Not on file  Tobacco Use   Smoking status: Never   Smokeless tobacco: Never  Vaping Use   Vaping Use: Never used  Substance and Sexual Activity   Alcohol use: Never   Drug use: Never   Sexual activity: Not Currently  Other Topics Concern   Not on file  Social History Narrative   She is married and has two children   Caroleen daughter lives with her.   Social Determinants of Health   Financial Resource Strain: Low Risk  (05/13/2022)   Overall Financial Resource Strain (CARDIA)    Difficulty of Paying  Living Expenses: Not hard at all  Food Insecurity: No Food Insecurity (05/13/2022)   Hunger Vital Sign    Worried About Running Out of Food in the Last Year: Never true    Ran Out of Food in the Last Year: Never true  Transportation Needs: No Transportation Needs (05/13/2022)   PRAPARE - Hydrologist (Medical): No    Lack of Transportation (Non-Medical): No  Physical Activity: Not on file  Stress: No Stress Concern Present (05/13/2022)   [place]    Feeling of Stress : Not at  all  Social Connections: Unknown (01/07/2021)   Social Connection and Isolation Panel [NHANES]    Frequency of Communication with Friends and Family: More than three times a week    Frequency of Social Gatherings with Friends and Family: Not on file    Attends Religious Services: Not on file    Active Member of Clubs or Organizations: Not on file    Attends Archivist Meetings: Not on file    Marital Status: Not on file     Review of Systems  Constitutional:  Negative for appetite change and fever.  HENT:  Negative for congestion and sinus pressure.   Respiratory:  Negative for cough, chest tightness and shortness of breath.   Cardiovascular:  Negative for chest pain and palpitations.  Gastrointestinal:  Negative for abdominal pain and vomiting.  Genitourinary:  Positive for dysuria and frequency.  Musculoskeletal:  Negative for joint swelling and myalgias.  Skin:  Negative for color change and rash.  Neurological:  Negative for dizziness and headaches.  Psychiatric/Behavioral:  Negative for agitation and dysphoric mood.        Objective:     BP 132/80   Pulse 86   Temp 97.6 F (36.4 C)   Resp 16   Ht '5\' 4"'$  (1.626 m)   Wt 136 lb 9.6 oz (62 kg)   SpO2 98%   BMI 23.45 kg/m  Wt Readings from Last 3 Encounters:  12/21/22 136 lb 9.6 oz (62 kg)  11/27/22 132 lb (59.9 kg)  10/16/22 125 lb 12.8 oz  (57.1 kg)    Physical Exam Vitals reviewed.  Constitutional:      General: She is not in acute distress.    Appearance: Normal appearance.  HENT:     Head: Normocephalic and atraumatic.     Right Ear: External ear normal.     Left Ear: External ear normal.  Eyes:     General: No scleral icterus.       Right eye: No discharge.        Left eye: No discharge.     Conjunctiva/sclera: Conjunctivae normal.  Neck:     Thyroid: No thyromegaly.  Cardiovascular:     Rate and Rhythm: Normal rate and regular rhythm.  Pulmonary:     Effort: No respiratory distress.     Breath sounds: Normal breath sounds. No wheezing.  Abdominal:     General: Bowel sounds are normal.     Palpations: Abdomen is soft.     Tenderness: There is no abdominal tenderness.  Genitourinary:    Comments: Normal external genitalia.  Vaginal vault without lesions.  KOH/wet prep obtained. Could not appreciate any adnexal masses or tenderness.   Musculoskeletal:        General: No swelling or tenderness.     Cervical back: Neck supple. No tenderness.  Lymphadenopathy:     Cervical: No cervical adenopathy.  Skin:    Findings: No erythema or rash.  Neurological:     Mental Status: She is alert.  Psychiatric:        Mood and Affect: Mood normal.        Behavior: Behavior normal.      Outpatient Encounter Medications as of 12/21/2022  Medication Sig   nystatin cream (MYCOSTATIN) Apply 1 Application topically 2 (two) times daily.   acetaminophen (TYLENOL) 500 MG tablet Take 2 tablets (1,000 mg total) by mouth every 6 (six) hours as needed for mild pain.   albuterol (VENTOLIN HFA) 108 (90 Base) MCG/ACT  inhaler Inhale 2 puffs into the lungs every 6 (six) hours as needed for wheezing or shortness of breath.   amLODipine (NORVASC) 5 MG tablet TAKE 1 TABLET(5 MG) BY MOUTH TWICE DAILY   budesonide-formoterol (SYMBICORT) 160-4.5 MCG/ACT inhaler Inhale 2 puffs into the lungs 2 (two) times daily. (Patient taking  differently: Inhale 2 puffs into the lungs 2 (two) times daily as needed (shortness of breath).)   Cholecalciferol (VITAMIN D) 50 MCG (2000 UT) tablet Take 2,000 Units by mouth daily.   CREON 36000-114000 units CPEP capsule Take 1-2 capsules (36,000-72,000 Units total) by mouth See admin instructions. Take 720000 units with each meal and 36000 units with snacks   diphenhydramine-acetaminophen (TYLENOL PM) 25-500 MG TABS tablet Take 2 tablets by mouth at bedtime as needed (sleep).   fluticasone (FLOVENT HFA) 110 MCG/ACT inhaler Inhale 2 puffs into the lungs 2 (two) times daily as needed (shortness of breath).   gabapentin (NEURONTIN) 100 MG capsule Take 400 mg by mouth at bedtime.   insulin degludec (TRESIBA FLEXTOUCH) 100 UNIT/ML FlexTouch Pen Inject 16 Units into the skin daily.   loperamide (IMODIUM) 2 MG capsule Take 4 mg by mouth daily.   losartan (COZAAR) 100 MG tablet TAKE 1 TABLET(100 MG) BY MOUTH AT BEDTIME   Melatonin 10 MG TABS Take 10 mg by mouth at bedtime.   omeprazole (PRILOSEC) 20 MG capsule TAKE 1 CAPSULE(20 MG) BY MOUTH TWICE DAILY   ONE TOUCH ULTRA TEST test strip PATIENT NEEDS NEW METER STRIPS AND LANCETS FOR ONE TOUCH. HER INSURANCE NO LONGER COVERS ACCUCHEK.   rosuvastatin (CRESTOR) 10 MG tablet TAKE 1 TABLET(10 MG) BY MOUTH DAILY   [DISCONTINUED] albuterol (PROVENTIL) (2.5 MG/3ML) 0.083% nebulizer solution Take 3 mLs (2.5 mg total) by nebulization every 4 (four) hours as needed for wheezing or shortness of breath.   No facility-administered encounter medications on file as of 12/21/2022.     Lab Results  Component Value Date   WBC 5.9 08/03/2022   HGB 11.5 (L) 08/03/2022   HCT 35.0 (L) 08/03/2022   PLT 221.0 08/03/2022   GLUCOSE 119 (H) 10/12/2022   CHOL 127 10/12/2022   TRIG 102.0 10/12/2022   HDL 50.10 10/12/2022   LDLDIRECT 74.0 04/19/2019   LDLCALC 57 10/12/2022   ALT 11 10/12/2022   AST 18 10/12/2022   NA 142 10/12/2022   K 4.2 10/12/2022   CL 104  10/12/2022   CREATININE 0.90 10/12/2022   BUN 14 10/12/2022   CO2 30 10/12/2022   TSH 2.021 07/02/2022   INR 1.0 05/30/2022   HGBA1C 7.3 (H) 10/12/2022   MICROALBUR 1.4 04/16/2022    No results found.     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  Underwent diagnostic cerebral angiogram with no recurrent aneurysm.  Recommended one year MRI angiogram - follow up. Had f/u with neurology.  Recommended f/u with NSU. Need to confirm if f/u scan warranted.     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  Underwent diagnostic cerebral angiogram with no recurrent aneurysm.  Recommended one year MRI angiogram - follow up. Had f/u with neurology.  Recommended f/u with NSU. Need to confirm if f/u scan warranted.  Depression, major, single episode, mild ([place]) Assessment & Plan: Discussed.  Overall appears to be handling things relatively well.  Will notify if feels needs further intervention.  Follow.    Gastroesophageal reflux disease, unspecified whether esophagitis present Assessment & Plan: Continue omeprazole.    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  MRA head and neck were negative.  ECHO - no evidence of embolic source. With history of significant GI bleed in the past due to AVM, polyp bleed, still requiring intermittent IV transfusion, so at this point she was not started on antiplatelet therapy.   Hypercholesterolemia Assessment & Plan: Continue crestor. Follow lipid panel and liver function tests.     Primary  hypertension Assessment & Plan: On amlodipine and losartan.  Follow pressures.  Follow metabolic panel.    Pseudopolyposis of colon with complication, unspecified part of colon [facility]) Assessment & Plan: S/p colonoscopy 07/24/21 as outlined.  Continues f/u with GI.  Started entyvio.  Is helping.  Follow.    Meningioma [facility]) Assessment & Plan: Saw neurology 03/10/22 - recommended MRI. Reviewed with neurology.  Recommended f/u with NSU as outlined.     Personal history of colonic polyps Assessment & Plan: Colonoscopy 12/2017.  Polyps.  Seeing GI.     Pituitary lesion [facility]) Assessment & Plan: Pituitary microadenoma -She was evaluated by neurology ([facility]) Recommended - MRI 6 months.     Psoriatic arthritis ([place]) Assessment & Plan: Stable.  Has been followed by rheumatology.     Stress Assessment & Plan: Increased stress with the move and underlying medical issues and financial issues.  Discussed.  Has good support.  Follow. Notify if feels needs further intervention.    Type 2 diabetes mellitus with hyperglycemia, with long-term current use of insulin ([place]) Assessment & Plan: On insulin.  Follow sugars. Follow met b and A1c. Low carb diet and exercise.    Urinary frequency Assessment & Plan: Check urine to confirm for possible urinary tract infection.  Nystatin cream for peri vaginal irritation.  Call with update.    Other orders -     Nystatin; Apply 1 Application topically 2 (two) times daily.  Dispense: 30 g; Refill: 0     Einar Pheasant, MD

## 2022-12-22 LAB — CERVICOVAGINAL ANCILLARY ONLY
Bacterial Vaginitis (gardnerella): NEGATIVE
Candida Glabrata: NEGATIVE
Candida Vaginitis: NEGATIVE
Comment: NEGATIVE
Comment: NEGATIVE
Comment: NEGATIVE

## 2022-12-23 LAB — URINE CULTURE
MICRO NUMBER:: 14583211
SPECIMEN QUALITY:: ADEQUATE

## 2022-12-24 ENCOUNTER — Other Ambulatory Visit: Payer: Self-pay

## 2022-12-24 MED ORDER — AMOXICILLIN-POT CLAVULANATE 875-125 MG PO TABS: 1.0000 | ORAL_TABLET | Freq: Two times a day (BID) | ORAL | 0 refills | Status: DC

## 2022-12-25 ENCOUNTER — Inpatient Hospital Stay: Payer: PPO

## 2022-12-25 ENCOUNTER — Encounter: Payer: PPO | Admitting: Oncology

## 2022-12-25 ENCOUNTER — Inpatient Hospital Stay: Payer: PPO | Attending: Oncology

## 2022-12-25 ENCOUNTER — Inpatient Hospital Stay: Payer: PPO | Admitting: Medical Oncology

## 2022-12-27 ENCOUNTER — Encounter: Payer: Self-pay | Admitting: Internal Medicine

## 2022-12-27 NOTE — Assessment & Plan Note (Signed)
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  Underwent diagnostic cerebral angiogram with no recurrent aneurysm.  Recommended one year MRI angiogram - follow up. Had f/u with neurology.  Recommended f/u with NSU. Need to confirm if f/u scan warranted.

## 2022-12-27 NOTE — Assessment & Plan Note (Signed)
Discussed.  Overall appears to be handling things relatively well.  Will notify if feels needs further intervention.  Follow.

## 2022-12-27 NOTE — Assessment & Plan Note (Signed)
On insulin.  Follow sugars. Follow met b and A1c. Low carb diet and exercise.

## 2022-12-27 NOTE — Assessment & Plan Note (Signed)
Colonoscopy 12/2017.  Polyps.  Seeing GI.

## 2022-12-27 NOTE — Assessment & Plan Note (Signed)
S/p colonoscopy 07/24/21 as outlined.  Continues f/u with GI.  Started entyvio.  Is helping.  Follow.

## 2022-12-27 NOTE — Assessment & Plan Note (Signed)
Continue omeprazole 

## 2022-12-27 NOTE — Assessment & Plan Note (Signed)
On amlodipine and losartan.  Follow pressures.  Follow metabolic panel.

## 2022-12-27 NOTE — Assessment & Plan Note (Signed)
Continue crestor.  Follow lipid panel and liver function tests.  

## 2022-12-27 NOTE — Assessment & Plan Note (Signed)
Saw neurology 03/10/22 - recommended MRI. Reviewed with neurology.  Recommended f/u with NSU as outlined.

## 2022-12-27 NOTE — Assessment & Plan Note (Signed)
Increased stress with the move and underlying medical issues and financial issues.  Discussed.  Has good support.  Follow. Notify if feels needs further intervention.

## 2022-12-27 NOTE — Assessment & Plan Note (Signed)
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

## 2022-12-27 NOTE — Assessment & Plan Note (Signed)
Stable.  Has been followed by rheumatology.

## 2022-12-27 NOTE — Assessment & Plan Note (Signed)
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  MRA head and neck were negative.  ECHO - no evidence of embolic source. With history of significant GI bleed in the past due to AVM, polyp bleed, still requiring intermittent IV transfusion, so at this point she was not started on antiplatelet therapy.

## 2022-12-28 ENCOUNTER — Encounter: Payer: Self-pay | Admitting: Internal Medicine

## 2022-12-28 DIAGNOSIS — R350 Frequency of micturition: Secondary | ICD-10-CM | POA: Insufficient documentation

## 2022-12-28 NOTE — Assessment & Plan Note (Signed)
Check urine to confirm for possible urinary tract infection.  Nystatin cream for peri vaginal irritation.  Call with update.

## 2023-01-07 ENCOUNTER — Ambulatory Visit: Payer: PPO | Admitting: Neurosurgery

## 2023-01-13 NOTE — Progress Notes (Deleted)
Referring Physician:  Einar Pheasant, [id number] [place],  [place] [address]  Primary Physician:  Einar Pheasant, MD  History of Present Illness: 01/13/2023 Ashley Gross is here today with a chief complaint of ***  Meningioma and aneurysm - neurology recommended follow up with neurosurgery to confirm is follow up scan is warranted History of aneurysm with embolization in 09/2021 and 05/2022  Chronic headaches Dizziness Double vision since stroke Smoking status?   Past Surgery: *** aneurysm embolization in 09/2021 and 05/2022 by Dr Tennis Must Thomasene Ripple has ***no symptoms of cervical myelopathy.  The symptoms are causing a significant impact on the patient's life.   I have utilized the care everywhere function in epic to review the outside records available from external health systems.  Review of Systems:  A 10 point review of systems is negative, except for the pertinent positives and negatives detailed in the HPI.  Past Medical History: Past Medical History:  Diagnosis Date   Abnormal liver function test 10/06/2012   Anemia 01/25/2022   Anemia, unspecified    Asthma    B12 deficiency 02/11/2019   Bleeding internal hemorrhoids 09/13/2017   Choledocholithiasis 06/29/2012   Formatting of this note might be different from the original. Dilated CBD on abdominal imaging 04/2012   Chronic diarrhea 02/16/2013   Colitis    Complication of anesthesia    asthma attack after shoulder surgery   Diabetes mellitus ([place])    type II   Esophagitis    GERD (gastroesophageal reflux disease)    Heart murmur    for years- nothing to be concerned about.   History of kidney stones    Hx of arteriovenous malformation (AVM)    Hypertension    IBS (irritable bowel syndrome)    Loss of weight    Pernicious anemia    Personal history of colonic polyps 02/10/2013   02/08/13 colonoscopy - question of rectal varices, two 3-11m polyps in the  sigmoid colon, mass at the hepatic flexure, one 567mpolyp at the hepatic flexure, nodule at the ileocecal valve, congested mucusa in the entire colon, flattened villi mucosa in the terminal ileum.     Pneumonia    Psoriatic arthritis ([facility]   s/p penicillamine, plaquenil, MTX, sulfasalazine.  s/p Embrel, Humira,.  Iritis.     Pure hypercholesterolemia    Rectal bleeding 07/15/2017   [place]   per patient    Past Surgical History: Past Surgical History:  Procedure Laterality Date   ABDOMINAL HYSTERECTOMY  1981   ovaries left in place   ANKLE SURGERY     right   BUNIONECTOMY     CHOLECYSTECTOMY  1989   COLONOSCOPY WITH PROPOFOL N/A 12/27/2017   Procedure: COLONOSCOPY WITH PROPOFOL;  Surgeon: VaLin LandsmanMD;  [facility];  Service: Gastroenterology;  Laterality: N/A;   COLONOSCOPY WITH PROPOFOL N/A 01/04/2019   Procedure: COLONOSCOPY WITH PROPOFOL;  Surgeon: VaLin LandsmanMD;  [facility] Service: Endoscopy;  Laterality: N/A;  Diabetic - oral and injectable   COLONOSCOPY WITH PROPOFOL N/A 07/24/2021   Procedure: COLONOSCOPY WITH PROPOFOL;  Surgeon: VaLin LandsmanMD;  [facility] Service: Endoscopy;  Laterality: N/A;  Diabetic   ESOPHAGOGASTRODUODENOSCOPY (EGD) WITH PROPOFOL N/A 12/27/2017   Procedure: ESOPHAGOGASTRODUODENOSCOPY (EGD) WITH PROPOFOL;  Surgeon: VaLin LandsmanMD;  [facility];  Service: Gastroenterology;  Laterality: N/A;   HAND SURGERY  right   HEEL SPUR SURGERY     IR 3D INDEPENDENT WKST  10/01/2021   IR ANGIO INTRA EXTRACRAN SEL COM CAROTID INNOMINATE UNI L MOD SED  10/01/2021   IR ANGIO INTRA EXTRACRAN SEL INTERNAL CAROTID UNI R MOD SED  10/01/2021   IR ANGIO INTRA EXTRACRAN SEL INTERNAL CAROTID UNI R MOD SED  05/29/2022   IR ANGIO VERTEBRAL SEL VERTEBRAL BILAT MOD SED  10/01/2021   IR ANGIO VERTEBRAL SEL VERTEBRAL UNI R MOD SED  05/29/2022   IR CT HEAD LTD   10/01/2021   IR TRANSCATH/EMBOLIZ  10/01/2021   IR US GUIDE VASC ACCESS RIGHT  10/01/2021   IR US GUIDE VASC ACCESS RIGHT  05/29/2022   NOSE SURGERY     x2   POLYPECTOMY  01/04/2019   Procedure: POLYPECTOMY;  Surgeon: Lin Landsman, MD;  Location: [place];  Service: Endoscopy;;   POLYPECTOMY N/A 07/24/2021   Procedure: POLYPECTOMY;  Surgeon: Lin Landsman, MD;  Location: [place];  Service: Endoscopy;  Laterality: N/A;   RADIOLOGY WITH ANESTHESIA N/A 10/01/2021   Procedure: Starr Sinclair WITH ANESTHESIA;  Surgeon: Pedro Earls, MD;  Location: [place];  Service: Radiology;  Laterality: N/A;   SHOULDER ARTHROSCOPY WITH OPEN ROTATOR CUFF REPAIR Left 09/20/2018   Procedure: SHOULDER ARTHROSCOPY WITH OPEN ROTATOR CUFF REPAIR;  Surgeon: Corky Mull, MD;  Location: [facility];  Service: Orthopedics;  Laterality: Left;   SHOULDER CLOSED REDUCTION Left 12/21/2018   Procedure: MANIPULATION UNDER ANESTHESIA WITH STEROID INJECTION;  Surgeon: Corky Mull, MD;  Location: [place];  Service: Orthopedics;  Laterality: Left;  Diabetic - insulin and oral meds   UMBILICAL HERNIA REPAIR     XI ROBOTIC ASSISTED INGUINAL HERNIA REPAIR WITH MESH Left 12/16/2021   Procedure: XI ROBOTIC ASSISTED INGUINAL HERNIA REPAIR WITH MESH;  Surgeon: Olean Ree, MD;  Location: [facility];  Service: General;  Laterality: Left;    Allergies: Allergies as of 01/14/2023 - Review Complete 12/28/2022  Allergen Reaction Noted   Aspirin Other (See Comments) 10/06/2012   Beta adrenergic blockers  10/06/2012   Lisinopril Cough 12/21/2018   Mtx support [cobalamine combinations] Other (See Comments) 10/06/2012   Vasotec [enalapril] Cough 10/06/2012   Verapamil Other (See Comments) 10/06/2012   Voltaren [diclofenac sodium]  10/06/2012   Erythromycin Other (See Comments) 10/06/2012   Indocin [indomethacin]  10/06/2012   Jardiance [empagliflozin] Other (See Comments) 02/26/2020     Medications: No outpatient medications have been marked as taking for the 01/14/23 encounter (Appointment) with Meade Maw, MD.    Social History: Social History   Tobacco Use   Smoking status: Never   Smokeless tobacco: Never  Vaping Use   Vaping Use: Never used  Substance Use Topics   Alcohol use: Never   Drug use: Never    Family Medical History: Family History  Problem Relation Age of Onset   Diabetes Other    Hypertension Other    Breast cancer Neg Hx    Colon cancer Neg Hx     Physical Examination: There were no vitals filed for this visit.  General: Patient is well developed, well nourished, calm, collected, and in no apparent distress. Attention to examination is appropriate.  Neck:   Supple.  Full range of motion.  Respiratory: Patient is breathing without any difficulty.   NEUROLOGICAL:     Awake, alert, oriented to person, place, and time.  Speech is clear and fluent.   Cranial Nerves: Pupils equal  round and reactive to light.  Facial tone is symmetric.  Facial sensation is symmetric. Shoulder shrug is symmetric. Tongue protrusion is midline.  There is no pronator drift.  ROM of spine: full.    Strength: Side Biceps Triceps Deltoid Interossei Grip Wrist Ext. Wrist Flex.  R '5 5 5 5 5 5 5  '$ L '5 5 5 5 5 5 5   '$ Side Iliopsoas Quads Hamstring PF DF EHL  R '5 5 5 5 5 5  '$ L '5 5 5 5 5 5   '$ Reflexes are ***2+ and symmetric at the biceps, triceps, brachioradialis, patella and achilles.   Hoffman's is absent.   Bilateral upper and lower extremity sensation is intact to light touch.    No evidence of dysmetria noted.  Gait is normal.     Medical Decision Making  Imaging: ***  I have personally reviewed the images and agree with the above interpretation.  Assessment and Plan: Ashley Gross is a pleasant 72 y.o. female with ***    Thank you for involving me in the care of this patient.      Chester K. Izora Ribas MD, [facility] Neurosurgery

## 2023-01-14 ENCOUNTER — Ambulatory Visit: Payer: PPO | Admitting: Neurosurgery

## 2023-01-14 ENCOUNTER — Telehealth: Payer: Self-pay | Admitting: Internal Medicine

## 2023-01-14 DIAGNOSIS — I729 Aneurysm of unspecified site: Secondary | ICD-10-CM

## 2023-01-14 DIAGNOSIS — M7989 Other specified soft tissue disorders: Secondary | ICD-10-CM

## 2023-01-14 NOTE — Telephone Encounter (Signed)
pt called requesting the provider to resend a referral to vein and vascular in [place]

## 2023-01-14 NOTE — Telephone Encounter (Signed)
New referral placed.

## 2023-01-22 ENCOUNTER — Telehealth: Payer: Self-pay | Admitting: *Deleted

## 2023-01-22 ENCOUNTER — Other Ambulatory Visit (INDEPENDENT_AMBULATORY_CARE_PROVIDER_SITE_OTHER): Payer: PPO

## 2023-01-22 DIAGNOSIS — E1121 Type 2 diabetes mellitus with diabetic nephropathy: Secondary | ICD-10-CM

## 2023-01-22 DIAGNOSIS — I10 Essential (primary) hypertension: Secondary | ICD-10-CM

## 2023-01-22 DIAGNOSIS — E7800 Pure hypercholesterolemia, unspecified: Secondary | ICD-10-CM | POA: Diagnosis not present

## 2023-01-22 LAB — LIPID PANEL
Cholesterol: 127 mg/dL (ref 0–200)
HDL: 55.8 mg/dL (ref >39.00)
LDL Cholesterol: 50 mg/dL (ref 0–99)
NonHDL: 71.11
Total CHOL/HDL Ratio: 2
Triglycerides: 106 mg/dL (ref 0.0–149.0)
VLDL: 21.2 mg/dL (ref 0.0–40.0)

## 2023-01-22 LAB — BASIC METABOLIC PANEL
BUN: 18 mg/dL (ref 6–23)
CO2: 30 mEq/L (ref 19–32)
Calcium: 8.5 mg/dL (ref 8.4–10.5)
Chloride: 108 mEq/L (ref 96–112)
Creatinine, Ser: 0.98 mg/dL (ref 0.40–1.20)
GFR: 57.87 mL/min — ABNORMAL LOW (ref >60.00)
Glucose, Bld: 44 mg/dL — CL (ref 70–99)
Potassium: 4.1 mEq/L (ref 3.5–5.1)
Sodium: 145 mEq/L (ref 135–145)

## 2023-01-22 LAB — HEPATIC FUNCTION PANEL
ALT: 16 U/L (ref 0–35)
AST: 33 U/L (ref 0–37)
Albumin: 3.6 g/dL (ref 3.5–5.2)
Alkaline Phosphatase: 119 U/L — ABNORMAL HIGH (ref 39–117)
Bilirubin, Direct: 0.1 mg/dL (ref 0.0–0.3)
Total Bilirubin: 0.5 mg/dL (ref 0.2–1.2)
Total Protein: 5.7 g/dL — ABNORMAL LOW (ref 6.0–8.3)

## 2023-01-22 LAB — HEMOGLOBIN A1C: Hgb A1c MFr Bld: 6.4 % (ref 4.6–6.5)

## 2023-01-22 NOTE — Telephone Encounter (Signed)
CRITICAL VALUE STICKER  CRITICAL VALUE: Glucose-44  RECEIVER (on-site recipient of call): Jari Favre, CMA  DATE & TIME NOTIFIED: 01/22/23 @ 3:55pm  MESSENGER (representative from lab): Saa  MD NOTIFIED: Dr. Nicki Reaper  TIME OF NOTIFICATION: 3:55pm  RESPONSE:

## 2023-01-22 NOTE — Progress Notes (Unsigned)
Referring Physician:  Einar Pheasant, MD [id number] [place],  [place] [address]  Primary Physician:  Einar Pheasant, MD  History of Present Illness: 01/26/2023 [name] is here today with a chief complaint of known aneurysm status post coiling.  She had this performed in 2022.  On an MRI scan from last year, she was found to have a pituitary adenoma as well as a small meningioma.  On review of her prior imaging, these were also present at that time.  On her follow-up angiogram, she unfortunately suffered multiple strokes.  She is completely recovered from this, but is very hesitant to consider any further invasive imaging.  She has no symptoms today.  Past Surgery: Nov 2022  Angiogram  July 28,2023, caused stroke   Ashley Gross has no symptoms of cervical myelopathy.  The symptoms are causing a significant impact on the patient's life.   I have utilized the care everywhere function in epic to review the outside records available from external health systems.  Review of Systems:  A 10 point review of systems is negative, except for the pertinent positives and negatives detailed in the HPI.  Past Medical History: Past Medical History:  Diagnosis Date   Abnormal liver function test 10/06/2012   Anemia 01/25/2022   Anemia, unspecified    Asthma    B12 deficiency 02/11/2019   Bleeding internal hemorrhoids 09/13/2017   Choledocholithiasis 06/29/2012   Formatting of this note might be different from the original. Dilated CBD on abdominal imaging 04/2012   Chronic diarrhea 02/16/2013   Colitis    Complication of anesthesia    asthma attack after shoulder surgery   Diabetes mellitus ([place])    type II   Esophagitis    GERD (gastroesophageal reflux disease)    Heart murmur    for years- nothing to be concerned about.   History of kidney stones    Hx of arteriovenous malformation (AVM)    Hypertension    IBS (irritable bowel syndrome)     Loss of weight    Pernicious anemia    Personal history of colonic polyps 02/10/2013   02/08/13 colonoscopy - question of rectal varices, two 3-68mm polyps in the sigmoid colon, mass at the hepatic flexure, one 3mm polyp at the hepatic flexure, nodule at the ileocecal valve, congested mucusa in the entire colon, flattened villi mucosa in the terminal ileum.     Pneumonia    Psoriatic arthritis ([place])    s/p penicillamine, plaquenil, MTX, sulfasalazine.  s/p Embrel, Humira,.  Iritis.     Pure hypercholesterolemia    Rectal bleeding 07/15/2017   Stroke ([place]) 05/29/2022   per patient    Past Surgical History: Past Surgical History:  Procedure Laterality Date   ABDOMINAL HYSTERECTOMY  1981   ovaries left in place   ANKLE SURGERY     right   BUNIONECTOMY     CHOLECYSTECTOMY  1989   COLONOSCOPY WITH PROPOFOL N/A 12/27/2017   Procedure: COLONOSCOPY WITH PROPOFOL;  Surgeon: Lin Landsman, MD;  [facility];  Service: Gastroenterology;  Laterality: N/A;   COLONOSCOPY WITH PROPOFOL N/A 01/04/2019   Procedure: COLONOSCOPY WITH PROPOFOL;  Surgeon: Lin Landsman, MD;  Location: [place];  Service: Endoscopy;  Laterality: N/A;  Diabetic - oral and injectable   COLONOSCOPY WITH PROPOFOL N/A 07/24/2021   Procedure: COLONOSCOPY WITH PROPOFOL;  Surgeon: Lin Landsman, MD;  Location: [place];  Service: Endoscopy;  Laterality: N/A;  Diabetic   ESOPHAGOGASTRODUODENOSCOPY (EGD) WITH PROPOFOL N/A 12/27/2017   Procedure: ESOPHAGOGASTRODUODENOSCOPY (EGD) WITH PROPOFOL;  Surgeon: Lin Landsman, MD;  Location: [place];  Service: Gastroenterology;  Laterality: N/A;   HAND SURGERY     right   HEEL SPUR SURGERY     IR 3D INDEPENDENT WKST  10/01/2021   IR ANGIO INTRA EXTRACRAN SEL COM CAROTID INNOMINATE UNI L MOD SED  10/01/2021   IR ANGIO INTRA EXTRACRAN SEL INTERNAL CAROTID UNI R MOD SED  10/01/2021   IR ANGIO INTRA EXTRACRAN SEL INTERNAL CAROTID UNI  R MOD SED  05/29/2022   IR ANGIO VERTEBRAL SEL VERTEBRAL BILAT MOD SED  10/01/2021   IR ANGIO VERTEBRAL SEL VERTEBRAL UNI R MOD SED  05/29/2022   IR CT HEAD LTD  10/01/2021   IR TRANSCATH/EMBOLIZ  10/01/2021   IR US GUIDE VASC ACCESS RIGHT  10/01/2021   IR US GUIDE VASC ACCESS RIGHT  05/29/2022   NOSE SURGERY     x2   POLYPECTOMY  01/04/2019   Procedure: POLYPECTOMY;  Surgeon: Lin Landsman, MD;  Location: [place];  Service: Endoscopy;;   POLYPECTOMY N/A 07/24/2021   Procedure: POLYPECTOMY;  Surgeon: Lin Landsman, MD;  Location: [place];  Service: Endoscopy;  Laterality: N/A;   RADIOLOGY WITH ANESTHESIA N/A 10/01/2021   Procedure: Starr Sinclair WITH ANESTHESIA;  Surgeon: Pedro Earls, MD;  Location: [place];  Service: Radiology;  Laterality: N/A;   SHOULDER ARTHROSCOPY WITH OPEN ROTATOR CUFF REPAIR Left 09/20/2018   Procedure: SHOULDER ARTHROSCOPY WITH OPEN ROTATOR CUFF REPAIR;  Surgeon: Corky Mull, MD;  Location: [facility];  Service: Orthopedics;  Laterality: Left;   SHOULDER CLOSED REDUCTION Left 12/21/2018   Procedure: MANIPULATION UNDER ANESTHESIA WITH STEROID INJECTION;  Surgeon: Corky Mull, MD;  Location: [place];  Service: Orthopedics;  Laterality: Left;  Diabetic - insulin and oral meds   UMBILICAL HERNIA REPAIR     XI ROBOTIC ASSISTED INGUINAL HERNIA REPAIR WITH MESH Left 12/16/2021   Procedure: XI ROBOTIC ASSISTED INGUINAL HERNIA REPAIR WITH MESH;  Surgeon: Olean Ree, MD;  Location: [facility];  Service: General;  Laterality: Left;    Allergies: Allergies as of 01/26/2023 - Review Complete 01/26/2023  Allergen Reaction Noted   Aspirin Other (See Comments) 10/06/2012   Beta adrenergic blockers  10/06/2012   Lisinopril Cough 12/21/2018   Mtx support [cobalamine combinations] Other (See Comments) 10/06/2012   Vasotec [enalapril] Cough 10/06/2012   Verapamil Other (See Comments) 10/06/2012   Voltaren [diclofenac  sodium]  10/06/2012   Erythromycin Other (See Comments) 10/06/2012   Indocin [indomethacin]  10/06/2012   Jardiance [empagliflozin] Other (See Comments) 02/26/2020    Medications: Current Meds  Medication Sig   acetaminophen (TYLENOL) 500 MG tablet Take 2 tablets (1,000 mg total) by mouth every 6 (six) hours as needed for mild pain.   albuterol (VENTOLIN HFA) 108 (90 Base) MCG/ACT inhaler Inhale 2 puffs into the lungs every 6 (six) hours as needed for wheezing or shortness of breath.   amLODipine (NORVASC) 5 MG tablet TAKE 1 TABLET(5 MG) BY MOUTH TWICE DAILY   budesonide-formoterol (SYMBICORT) 160-4.5 MCG/ACT inhaler Inhale 2 puffs into the lungs 2 (two) times daily. (Patient taking differently: Inhale 2 puffs into the lungs 2 (two) times daily as needed (shortness of breath).)   Cholecalciferol (VITAMIN D) 50 MCG (2000 UT) tablet Take 2,000 Units by mouth daily.   CREON 36000-114000 units CPEP capsule Take 1-2 capsules (36,000-72,000 Units total) by  mouth See admin instructions. Take 720000 units with each meal and 36000 units with snacks   diphenhydramine-acetaminophen (TYLENOL PM) 25-500 MG TABS tablet Take 2 tablets by mouth at bedtime as needed (sleep).   fluticasone (FLOVENT HFA) 110 MCG/ACT inhaler Inhale 2 puffs into the lungs 2 (two) times daily as needed (shortness of breath).   gabapentin (NEURONTIN) 100 MG capsule Take 400 mg by mouth at bedtime.   insulin degludec (TRESIBA FLEXTOUCH) 100 UNIT/ML FlexTouch Pen Inject 16 Units into the skin daily.   loperamide (IMODIUM) 2 MG capsule Take 4 mg by mouth daily.   losartan (COZAAR) 100 MG tablet TAKE 1 TABLET(100 MG) BY MOUTH AT BEDTIME   Melatonin 10 MG TABS Take 10 mg by mouth at bedtime.   nystatin cream (MYCOSTATIN) Apply 1 Application topically 2 (two) times daily.   omeprazole (PRILOSEC) 20 MG capsule TAKE 1 CAPSULE(20 MG) BY MOUTH TWICE DAILY   ONE TOUCH ULTRA TEST test strip PATIENT NEEDS NEW METER STRIPS AND LANCETS FOR ONE  TOUCH. HER INSURANCE NO LONGER COVERS ACCUCHEK.   rosuvastatin (CRESTOR) 10 MG tablet TAKE 1 TABLET(10 MG) BY MOUTH DAILY    Social History: Social History   Tobacco Use   Smoking status: Never   Smokeless tobacco: Never  Vaping Use   Vaping Use: Never used  Substance Use Topics   Alcohol use: Never   Drug use: Never    Family Medical History: Family History  Problem Relation Age of Onset   Diabetes Other    Hypertension Other    Breast cancer Neg Hx    Colon cancer Neg Hx     Physical Examination: Vitals:   01/26/23 0837  BP: 130/76  Pulse: 77  SpO2: 98%    General: Patient is well developed, well nourished, calm, collected, and in no apparent distress. Attention to examination is appropriate.  Neck:   Supple.  Full range of motion.  Respiratory: Patient is breathing without any difficulty.   NEUROLOGICAL:     Awake, alert, oriented to person, place, and time.  Speech is clear and fluent.   Cranial Nerves: Pupils equal round and reactive to light.  Facial tone is symmetric.  Facial sensation is symmetric. Shoulder shrug is symmetric. Tongue protrusion is midline.  There is no pronator drift.  ROM of spine: full.    Strength: Side Biceps Triceps Deltoid Interossei Grip Wrist Ext. Wrist Flex.  R 5 5 5 5 5 5 5   L 5 5 5 5 5 5 5    Side Iliopsoas Quads Hamstring PF DF EHL  R 5 5 5 5 5 5   L 5 5 5 5 5 5    Reflexes are 1+ and symmetric at the biceps, triceps, brachioradialis, patella and achilles.   Hoffman's is absent.   Bilateral upper and lower extremity sensation is intact to light touch.    No evidence of dysmetria noted.  Gait is normal.     Medical Decision Making  Imaging: CTA 05/31/2022 IMPRESSION: 1. Negative for large vessel occlusion. No atherosclerosis in the neck. Mild for age atherosclerosis at the skull base with no significant arterial stenosis.   2. 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 No hemorrhage or  mass effect. The remaining small ischemic foci by MRI are occult by CT.   3. No new intracranial abnormality.     Electronically Signed   By: Genevie Ann M.D.   On: 05/31/2022 10:44  I have personally reviewed the images and agree with  the above interpretation.  Assessment and Plan: Ms. Henton is a pleasant 72 y.o. female with right sided small meningioma that appears stable on prior imaging.  She also has history of a known and treated intra communicating artery aneurysm.  She has a pituitary adenoma as well.  At this point, she is asymptomatic.  I recommended follow-up imaging.  Will arrange for MRI scan of the brain as well as MR angiogram of the brain in the summer.  I will touch base with her via telephone after that is performed.  If her findings are stable, she will likely follow with yearly imaging.  Thank you for involving me in the care of this patient.      Eisa Necaise K. Izora Ribas MD, [facility] Neurosurgery

## 2023-01-22 NOTE — Telephone Encounter (Signed)
Per discussion, pt feeling fine.  Had not eaten since yesterday afternoon.  Please confirm that she has not been having low sugars.  If having issues with low sugars, will need to adjust tresiba dose.  Make sure does not go long periods without eating.  Confirm on tresiba 16 units per day.  If so and if low sugars - will decrease tresiba to 14 units per day.  Follow sugars and send in readings.

## 2023-01-22 NOTE — Telephone Encounter (Signed)
Pt states that her sugars have been pretty good. She had a low when in [place] at 54 and she ate a pb cup. She has eaten earlier & she feels fine. Pt is aware to keep her dose he same (as confirmed at 16 units) & to keep a record of her reading & call us on Monday. Pt also aware if she develop sx's as mentioned below, to please go to urgent care or ED.

## 2023-01-22 NOTE — Telephone Encounter (Signed)
Patient is out currently not at home to recheck sugar but feels fine , no dizziness or weakness, has eaten Breakfast and lunch.

## 2023-01-25 ENCOUNTER — Encounter: Payer: Self-pay | Admitting: Internal Medicine

## 2023-01-25 NOTE — Telephone Encounter (Signed)
Spoke with patient. She was told to record sugars over the weekend and send them in. She is not having any acute issues or feeling like her sugar is low. She is usually able to tell when it is low. (There was one episode in [place] where she could tell it was low and when checked it was 54) She is aware of what to do if needed when her sugar starts to drop. She is eating regular meals. Not going long periods with out eating. She is still doing the tresiba 16 units. There is a result note on her as well. Has a f/u appt 4/2. She is aware that you are out of office until tomorrow PM.

## 2023-01-25 NOTE — Telephone Encounter (Signed)
Please confirm that tresiba is the only medication she is on for her diabetes.  If so, please confirm when she takes the medication.  She is currently on 16 units per day.  Have her decrease to 14 units per day.  Check sugars and call in readings over the next few days.

## 2023-01-26 ENCOUNTER — Encounter: Payer: Self-pay | Admitting: Neurosurgery

## 2023-01-26 ENCOUNTER — Ambulatory Visit (INDEPENDENT_AMBULATORY_CARE_PROVIDER_SITE_OTHER): Payer: PPO | Admitting: Neurosurgery

## 2023-01-26 VITALS — BP 130/76 | HR 77 | Ht 64.0 in | Wt 136.0 lb

## 2023-01-26 DIAGNOSIS — I671 Cerebral aneurysm, nonruptured: Secondary | ICD-10-CM | POA: Diagnosis not present

## 2023-01-26 DIAGNOSIS — D329 Benign neoplasm of meninges, unspecified: Secondary | ICD-10-CM

## 2023-01-26 NOTE — Telephone Encounter (Signed)
Patient takes her tresiba at dinner time because it is her largest meal. She is not on anything else for her diabetes. She is going to decrease to 14 units q day and will check/record sugars over the next few days. Confirmed pt doing ok.

## 2023-01-29 ENCOUNTER — Ambulatory Visit
Admission: RE | Admit: 2023-01-29 | Discharge: 2023-01-29 | Disposition: A | Discharge status: Home / Self Care | Payer: PPO | Source: Ambulatory Visit | Attending: Internal Medicine | Admitting: Internal Medicine

## 2023-01-29 ENCOUNTER — Ambulatory Visit: Payer: PPO | Admitting: Internal Medicine

## 2023-01-29 DIAGNOSIS — I719 Aortic aneurysm of unspecified site, without rupture: Secondary | ICD-10-CM | POA: Insufficient documentation

## 2023-01-29 MED ORDER — VEDOLIZUMAB 300 MG IV SOLR
300.0000 mg | Freq: Once | INTRAVENOUS | Status: AC
  Administered 2023-01-29: 300 mg via INTRAVENOUS
  Filled 2023-01-29: qty 5

## 2023-01-31 ENCOUNTER — Other Ambulatory Visit: Payer: Self-pay | Admitting: Family

## 2023-02-02 ENCOUNTER — Ambulatory Visit (INDEPENDENT_AMBULATORY_CARE_PROVIDER_SITE_OTHER): Payer: PPO | Admitting: Internal Medicine

## 2023-02-02 ENCOUNTER — Encounter: Payer: Self-pay | Admitting: Internal Medicine

## 2023-02-02 VITALS — BP 124/70 | HR 85 | Temp 98.3°F | Resp 16 | Ht 64.0 in | Wt 139.8 lb

## 2023-02-02 DIAGNOSIS — I10 Essential (primary) hypertension: Secondary | ICD-10-CM

## 2023-02-02 DIAGNOSIS — Z8673 Personal history of transient ischemic attack (TIA), and cerebral infarction without residual deficits: Secondary | ICD-10-CM | POA: Diagnosis not present

## 2023-02-02 DIAGNOSIS — J4520 Mild intermittent asthma, uncomplicated: Secondary | ICD-10-CM | POA: Diagnosis not present

## 2023-02-02 DIAGNOSIS — R351 Nocturia: Secondary | ICD-10-CM | POA: Diagnosis not present

## 2023-02-02 DIAGNOSIS — I729 Aneurysm of unspecified site: Secondary | ICD-10-CM | POA: Diagnosis not present

## 2023-02-02 DIAGNOSIS — M7989 Other specified soft tissue disorders: Secondary | ICD-10-CM | POA: Diagnosis not present

## 2023-02-02 DIAGNOSIS — K219 Gastro-esophageal reflux disease without esophagitis: Secondary | ICD-10-CM | POA: Diagnosis not present

## 2023-02-02 DIAGNOSIS — F320 Major depressive disorder, single episode, mild: Secondary | ICD-10-CM

## 2023-02-02 DIAGNOSIS — I671 Cerebral aneurysm, nonruptured: Secondary | ICD-10-CM

## 2023-02-02 DIAGNOSIS — E7800 Pure hypercholesterolemia, unspecified: Secondary | ICD-10-CM

## 2023-02-02 DIAGNOSIS — L4050 Arthropathic psoriasis, unspecified: Secondary | ICD-10-CM

## 2023-02-02 DIAGNOSIS — K51419 Inflammatory polyps of colon with unspecified complications: Secondary | ICD-10-CM | POA: Diagnosis not present

## 2023-02-02 DIAGNOSIS — E1121 Type 2 diabetes mellitus with diabetic nephropathy: Secondary | ICD-10-CM

## 2023-02-02 DIAGNOSIS — G479 Sleep disorder, unspecified: Secondary | ICD-10-CM

## 2023-02-02 DIAGNOSIS — R748 Abnormal levels of other serum enzymes: Secondary | ICD-10-CM

## 2023-02-02 DIAGNOSIS — D329 Benign neoplasm of meninges, unspecified: Secondary | ICD-10-CM

## 2023-02-02 DIAGNOSIS — E237 Disorder of pituitary gland, unspecified: Secondary | ICD-10-CM

## 2023-02-02 DIAGNOSIS — F439 Reaction to severe stress, unspecified: Secondary | ICD-10-CM

## 2023-02-02 NOTE — Progress Notes (Signed)
Subjective:    Patient ID: Ashley Gross, female    DOB: [birth year], 72 y.o.   MRN: [id number]  Patient here for  Chief Complaint  Patient presents with   Medical Management of Chronic Issues    HPI Here to follow up regarding her diabetes, hypertension and cholesterol.  Recent labs - decreased blood sugar.  Tresiba decreased.  Reports blood sugars 68-83.  Discussed decreasing insulin more.  She is eating.  No nausea or vomiting.  Still with rectal bleeding.  Sees GI.  On Entyvio.  Has noticed bleeding increased when due to get entyvio.  Takes imodium.  Has f/u with GI 04/2023.  Still with increased lower extremity swelling.  Discussed referral to vascular.  Desires [facility].  With increased nocturia - 5-6x/night.  Does not feel rested when wakes up.  Discussed possible sleep apnea.  Does not notice increased urinary frequency during the day.     Past Medical History:  Diagnosis Date   Abnormal liver function test 10/06/2012   Anemia 01/25/2022   Anemia, unspecified    Asthma    B12 deficiency 02/11/2019   Bleeding internal hemorrhoids 09/13/2017   Choledocholithiasis 06/29/2012   Formatting of this note might be different from the original. Dilated CBD on abdominal imaging 04/2012   Chronic diarrhea 02/16/2013   Colitis    Complication of anesthesia    asthma attack after shoulder surgery   Diabetes mellitus    type II   Esophagitis    GERD (gastroesophageal reflux disease)    Heart murmur    for years- nothing to be concerned about.   History of kidney stones    Hx of arteriovenous malformation (AVM)    Hypertension    IBS (irritable bowel syndrome)    Loss of weight    Pernicious anemia    Personal history of colonic polyps 02/10/2013   02/08/13 colonoscopy - question of rectal varices, two 3-63mm polyps in the sigmoid colon, mass at the hepatic flexure, one 5mm polyp at the hepatic flexure, nodule at the ileocecal valve, congested mucusa in the entire colon,  flattened villi mucosa in the terminal ileum.     Pneumonia    Psoriatic arthritis    s/p penicillamine, plaquenil, MTX, sulfasalazine.  s/p Embrel, Humira,.  Iritis.     Pure hypercholesterolemia    Rectal bleeding 07/15/2017   Stroke 05/29/2022   per patient   Past Surgical History:  Procedure Laterality Date   ABDOMINAL HYSTERECTOMY  1981   ovaries left in place   ANKLE SURGERY     right   BUNIONECTOMY     CHOLECYSTECTOMY  1989   COLONOSCOPY WITH PROPOFOL N/A 12/27/2017   Procedure: COLONOSCOPY WITH PROPOFOL;  Surgeon: Toney Reil, MD;  Location: [facility] ENDOSCOPY;  Service: Gastroenterology;  Laterality: N/A;   COLONOSCOPY WITH PROPOFOL N/A 01/04/2019   Procedure: COLONOSCOPY WITH PROPOFOL;  Surgeon: Toney Reil, MD;  Location: [facility] SURGERY CNTR;  Service: Endoscopy;  Laterality: N/A;  Diabetic - oral and injectable   COLONOSCOPY WITH PROPOFOL N/A 07/24/2021   Procedure: COLONOSCOPY WITH PROPOFOL;  Surgeon: Toney Reil, MD;  Location: [facility] SURGERY CNTR;  Service: Endoscopy;  Laterality: N/A;  Diabetic   ESOPHAGOGASTRODUODENOSCOPY (EGD) WITH PROPOFOL N/A 12/27/2017   Procedure: ESOPHAGOGASTRODUODENOSCOPY (EGD) WITH PROPOFOL;  Surgeon: Toney Reil, MD;  Location: [facility] ENDOSCOPY;  Service: Gastroenterology;  Laterality: N/A;   HAND SURGERY     right   HEEL SPUR SURGERY     IR  3D INDEPENDENT WKST  10/01/2021   IR ANGIO INTRA EXTRACRAN SEL COM CAROTID INNOMINATE UNI L MOD SED  10/01/2021   IR ANGIO INTRA EXTRACRAN SEL INTERNAL CAROTID UNI R MOD SED  10/01/2021   IR ANGIO INTRA EXTRACRAN SEL INTERNAL CAROTID UNI R MOD SED  05/29/2022   IR ANGIO VERTEBRAL SEL VERTEBRAL BILAT MOD SED  10/01/2021   IR ANGIO VERTEBRAL SEL VERTEBRAL UNI R MOD SED  05/29/2022   IR CT HEAD LTD  10/01/2021   IR TRANSCATH/EMBOLIZ  10/01/2021   IR US GUIDE VASC ACCESS RIGHT  10/01/2021   IR US GUIDE VASC ACCESS RIGHT  05/29/2022   NOSE SURGERY     x2   POLYPECTOMY  01/04/2019    Procedure: POLYPECTOMY;  Surgeon: Toney Reil, MD;  Location: [facility] SURGERY CNTR;  Service: Endoscopy;;   POLYPECTOMY N/A 07/24/2021   Procedure: POLYPECTOMY;  Surgeon: Toney Reil, MD;  Location: [facility] SURGERY CNTR;  Service: Endoscopy;  Laterality: N/A;   RADIOLOGY WITH ANESTHESIA N/A 10/01/2021   Procedure: Alfredia Client WITH ANESTHESIA;  Surgeon: Baldemar Lenis, MD;  Location: [facility] OR;  Service: Radiology;  Laterality: N/A;   SHOULDER ARTHROSCOPY WITH OPEN ROTATOR CUFF REPAIR Left 09/20/2018   Procedure: SHOULDER ARTHROSCOPY WITH OPEN ROTATOR CUFF REPAIR;  Surgeon: Christena Flake, MD;  Location: [facility] ORS;  Service: Orthopedics;  Laterality: Left;   SHOULDER CLOSED REDUCTION Left 12/21/2018   Procedure: MANIPULATION UNDER ANESTHESIA WITH STEROID INJECTION;  Surgeon: Christena Flake, MD;  Location: [facility] SURGERY CNTR;  Service: Orthopedics;  Laterality: Left;  Diabetic - insulin and oral meds   UMBILICAL HERNIA REPAIR     XI ROBOTIC ASSISTED INGUINAL HERNIA REPAIR WITH MESH Left 12/16/2021   Procedure: XI ROBOTIC ASSISTED INGUINAL HERNIA REPAIR WITH MESH;  Surgeon: [name], MD;  Location: [facility] ORS;  Service: General;  Laterality: Left;   Family History  Problem Relation Age of Onset   Diabetes Other    Hypertension Other    Breast cancer Neg Hx    Colon cancer Neg Hx    Social History   Socioeconomic History   Marital status: Widowed    Spouse name: Not on file   Number of children: 2   Years of education: Not on file   Highest education level: Not on file  Occupational History   Not on file  Tobacco Use   Smoking status: Never   Smokeless tobacco: Never  Vaping Use   Vaping Use: Never used  Substance and Sexual Activity   Alcohol use: Never   Drug use: Never   Sexual activity: Not Currently  Other Topics Concern   Not on file  Social History Narrative   She is married and has two children   Grand daughter lives with her.   Social  Determinants of Health   Financial Resource Strain: Low Risk  (05/13/2022)   Overall Financial Resource Strain (CARDIA)    Difficulty of Paying Living Expenses: Not hard at all  Food Insecurity: No Food Insecurity (05/13/2022)   Hunger Vital Sign    Worried About Running Out of Food in the Last Year: Never true    Ran Out of Food in the Last Year: Never true  Transportation Needs: No Transportation Needs (05/13/2022)   PRAPARE - Administrator, Civil Service (Medical): No    Lack of Transportation (Non-Medical): No  Physical Activity: Not on file  Stress: No Stress Concern Present (05/13/2022)   [employer] of Occupational  Health - Occupational Stress Questionnaire    Feeling of Stress : Not at all  Social Connections: Unknown (01/07/2021)   Social Connection and Isolation Panel [NHANES]    Frequency of Communication with Friends and Family: More than three times a week    Frequency of Social Gatherings with Friends and Family: Not on file    Attends Religious Services: Not on file    Active Member of Clubs or Organizations: Not on file    Attends BankerClub or Organization Meetings: Not on file    Marital Status: Not on file     Review of Systems  Constitutional:  Negative for appetite change and unexpected weight change.  HENT:  Negative for congestion and sinus pressure.   Respiratory:  Negative for cough and chest tightness.        Breathing stable.   Cardiovascular:  Negative for chest pain and palpitations.       Pedal and ankle swelling as outlined.   Gastrointestinal:  Negative for abdominal pain, nausea and vomiting.  Genitourinary:  Negative for difficulty urinating and dysuria.       Nocturia.   Musculoskeletal:  Negative for joint swelling and myalgias.  Skin:  Negative for color change and rash.  Neurological:  Negative for dizziness and headaches.  Psychiatric/Behavioral:  Negative for agitation and dysphoric mood.        Objective:     BP 124/70    Pulse 85   Temp 98.3 F (36.8 C)   Resp 16   Ht 5\' 4"  (1.626 m)   Wt 139 lb 12.8 oz (63.4 kg)   SpO2 99%   BMI 24.00 kg/m  Wt Readings from Last 3 Encounters:  02/04/23 138 lb (62.6 kg)  02/02/23 139 lb 12.8 oz (63.4 kg)  01/26/23 136 lb (61.7 kg)    Physical Exam Vitals reviewed.  Constitutional:      General: She is not in acute distress.    Appearance: Normal appearance.  HENT:     Head: Normocephalic and atraumatic.     Right Ear: External ear normal.     Left Ear: External ear normal.  Eyes:     General: No scleral icterus.       Right eye: No discharge.        Left eye: No discharge.     Conjunctiva/sclera: Conjunctivae normal.  Neck:     Thyroid: No thyromegaly.  Cardiovascular:     Rate and Rhythm: Normal rate and regular rhythm.  Pulmonary:     Effort: No respiratory distress.     Breath sounds: Normal breath sounds. No wheezing.  Abdominal:     General: Bowel sounds are normal.     Palpations: Abdomen is soft.     Tenderness: There is no abdominal tenderness.  Musculoskeletal:        General: No tenderness.     Cervical back: Neck supple. No tenderness.     Comments: Pedal and ankle swelling.  No increased erythema.   Lymphadenopathy:     Cervical: No cervical adenopathy.  Skin:    Findings: No erythema or rash.  Neurological:     Mental Status: She is alert.  Psychiatric:        Mood and Affect: Mood normal.        Behavior: Behavior normal.      Outpatient Encounter Medications as of 02/02/2023  Medication Sig   gabapentin (NEURONTIN) 600 MG tablet Take 600 mg by mouth daily.   acetaminophen (TYLENOL) 500  MG tablet Take 2 tablets (1,000 mg total) by mouth every 6 (six) hours as needed for mild pain.   albuterol (VENTOLIN HFA) 108 (90 Base) MCG/ACT inhaler Inhale 2 puffs into the lungs every 6 (six) hours as needed for wheezing or shortness of breath.   amLODipine (NORVASC) 5 MG tablet TAKE 1 TABLET(5 MG) BY MOUTH TWICE DAILY    budesonide-formoterol (SYMBICORT) 160-4.5 MCG/ACT inhaler Inhale 2 puffs into the lungs 2 (two) times daily. (Patient taking differently: Inhale 2 puffs into the lungs 2 (two) times daily as needed (shortness of breath).)   Cholecalciferol (VITAMIN D) 50 MCG (2000 UT) tablet Take 2,000 Units by mouth daily.   CREON 36000-114000 units CPEP capsule Take 1-2 capsules (36,000-72,000 Units total) by mouth See admin instructions. Take 720000 units with each meal and 14782 units with snacks   diphenhydramine-acetaminophen (TYLENOL PM) 25-500 MG TABS tablet Take 2 tablets by mouth at bedtime as needed (sleep).   fluticasone (FLOVENT HFA) 110 MCG/ACT inhaler Inhale 2 puffs into the lungs 2 (two) times daily as needed (shortness of breath).   insulin degludec (TRESIBA FLEXTOUCH) 100 UNIT/ML FlexTouch Pen Inject 16 Units into the skin daily.   loperamide (IMODIUM) 2 MG capsule Take 4 mg by mouth daily.   losartan (COZAAR) 100 MG tablet TAKE 1 TABLET(100 MG) BY MOUTH AT BEDTIME   Melatonin 10 MG TABS Take 10 mg by mouth at bedtime.   nystatin cream (MYCOSTATIN) Apply 1 Application topically 2 (two) times daily.   omeprazole (PRILOSEC) 20 MG capsule TAKE 1 CAPSULE(20 MG) BY MOUTH TWICE DAILY   ONE TOUCH ULTRA TEST test strip PATIENT NEEDS NEW METER STRIPS AND LANCETS FOR ONE TOUCH. HER INSURANCE NO LONGER COVERS ACCUCHEK.   rosuvastatin (CRESTOR) 10 MG tablet TAKE 1 TABLET(10 MG) BY MOUTH DAILY   [DISCONTINUED] gabapentin (NEURONTIN) 100 MG capsule Take 400 mg by mouth at bedtime.   No facility-administered encounter medications on file as of 02/02/2023.     Lab Results  Component Value Date   WBC 5.9 08/03/2022   HGB 11.5 (L) 08/03/2022   HCT 35.0 (L) 08/03/2022   PLT 221.0 08/03/2022   GLUCOSE 44 (LL) 01/22/2023   CHOL 127 01/22/2023   TRIG 106.0 01/22/2023   HDL 55.80 01/22/2023   LDLDIRECT 74.0 04/19/2019   LDLCALC 50 01/22/2023   ALT 15 02/02/2023   AST 20 02/02/2023   NA 145 01/22/2023   K  4.1 01/22/2023   CL 108 01/22/2023   CREATININE 0.98 01/22/2023   BUN 18 01/22/2023   CO2 30 01/22/2023   TSH 2.021 07/02/2022   INR 1.0 05/30/2022   HGBA1C 6.4 01/22/2023   MICROALBUR 1.4 04/16/2022    No results found.     Assessment & Plan:  Elevated alkaline phosphatase level -     Gamma GT -     Hepatic function panel  Aneurysm of anterior communicating artery Assessment & Plan: Saw Dr Myer Haff [facility] - 01/2023 - At this point, she is asymptomatic. He recommended follow-up imaging. His office will arrange for MRI scan of the brain as well as MR angiogram of the brain in the summer. He will touch base with her via telephone after that is performed. If her findings are stable, she will likely follow with yearly imaging.   Orders: -     Ambulatory referral to Vascular Surgery  Swelling of lower extremity Assessment & Plan: Compression hose.  ECHO 05/2022 - EF 65% with grade I DD.  Leg elevation.  Monitor salt intake.  Requested referral to vascular surgery. Refer to [employer].   Orders: -     Ambulatory referral to Vascular Surgery  Nocturia -     Ambulatory referral to Pulmonology  Aneurysm Assessment & Plan: Saw Dr Myer Haff [facility] - 01/2023 - At this point, she is asymptomatic. He recommended follow-up imaging. His office will arrange for MRI scan of the brain as well as MR angiogram of the brain in the summer. He will touch base with her via telephone after that is performed. If her findings are stable, she will likely follow with yearly imaging.    Mild intermittent asthma without complication Assessment & Plan: Breathing stable.    Depression, major, single episode, mild Assessment & Plan: Discussed.  Overall appears to be handling things relatively well.  Will notify if feels needs further intervention.  Follow.    Gastroesophageal reflux disease, unspecified whether esophagitis present Assessment & Plan: Continue omeprazole. No increased acid reflux.      Hypercholesterolemia Assessment & Plan: Continue crestor. Follow lipid panel and liver function tests.     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  MRA head and neck were negative.  ECHO - no evidence of embolic source. With history of significant GI bleed in the past due to AVM, polyp bleed.     Primary hypertension Assessment & Plan: On amlodipine and losartan.  Follow pressures.  Follow metabolic panel.    Pseudopolyposis of colon with complication, unspecified part of colon Assessment & Plan: S/p colonoscopy 07/24/21 as outlined.  Continues f/u with GI.  Started entyvio.  Is helping.  Bleeding as outlined - prior to dose.  Follow.  Keep f/u with GI.    Meningioma Assessment & Plan: [facility] - 01/2023 - plan MRI/MRA   Pituitary lesion Assessment & Plan: Pituitary microadenoma -She was evaluated by neurology ([facility]) Recommended - MRI 6 months.  [facility] - 01/2023 - plan MRI/MRA   Psoriatic arthritis Assessment & Plan: Stable.  Has been followed by rheumatology.     Sleep difficulties Assessment & Plan: On gabapentin.  With increased nocturia.  Not rested when wakes up.  Discussed possible sleep apnea.  Discussed swelling, etc.  Will refer to pulmonary for evaluation - question of sleep apnea.     Stress Assessment & Plan: Increased stress with the move and underlying medical issues and financial issues.  Discussed.  Has good support.  Follow. Notify if feels needs further intervention.    Type 2 diabetes mellitus with diabetic nephropathy, without long-term current use of insulin Assessment & Plan: On insulin.  Follow sugars. Follow met b and A1c. Low carb diet and exercise. Titrating down insulin dose - due to low sugars.  Follow.  Send in readings.        Dale Newcomb, MD

## 2023-02-03 LAB — HEPATIC FUNCTION PANEL
ALT: 15 U/L (ref 0–35)
AST: 20 U/L (ref 0–37)
Albumin: 3.6 g/dL (ref 3.5–5.2)
Alkaline Phosphatase: 105 U/L (ref 39–117)
Bilirubin, Direct: 0.1 mg/dL (ref 0.0–0.3)
Total Bilirubin: 0.3 mg/dL (ref 0.2–1.2)
Total Protein: 5.6 g/dL — ABNORMAL LOW (ref 6.0–8.3)

## 2023-02-03 LAB — GAMMA GT: GGT: 13 U/L (ref 7–51)

## 2023-02-04 ENCOUNTER — Ambulatory Visit (INDEPENDENT_AMBULATORY_CARE_PROVIDER_SITE_OTHER): Payer: PPO | Admitting: Nurse Practitioner

## 2023-02-04 VITALS — BP 120/70 | HR 90 | Temp 98.3°F | Resp 16 | Ht 68.0 in | Wt 138.0 lb

## 2023-02-04 DIAGNOSIS — R051 Acute cough: Secondary | ICD-10-CM

## 2023-02-04 MED ORDER — HYDROCODONE BIT-HOMATROP MBR 5-1.5 MG/5ML PO SOLN: 5.0000 mL | Freq: Every evening | ORAL | 0 refills | Status: DC | PRN

## 2023-02-04 MED ORDER — DOXYCYCLINE HYCLATE 100 MG PO TABS: 100.0000 mg | ORAL_TABLET | Freq: Two times a day (BID) | ORAL | 0 refills | Status: AC

## 2023-02-04 NOTE — Progress Notes (Signed)
Established Patient Office Visit  Subjective:  Patient ID: Ashley Gross, female    DOB: [birth year]  Age: 72 y.o. MRN: [id number]  CC:  Chief Complaint  Patient presents with   Cough    HPI  Ashley Gross presents for cough, chest tightness,   Cough This is a new problem. The current episode started yesterday. The problem has been unchanged. The problem occurs constantly. The cough is Non-productive. Associated symptoms include postnasal drip. Pertinent negatives include no chills, fever or shortness of breath. Associated symptoms comments: Chest tightness. Nothing aggravates the symptoms. She has tried nothing for the symptoms. Her past medical history is significant for asthma.     Past Medical History:  Diagnosis Date   Abnormal liver function test 10/06/2012   Anemia 01/25/2022   Anemia, unspecified    Asthma    B12 deficiency 02/11/2019   Bleeding internal hemorrhoids 09/13/2017   Choledocholithiasis 06/29/2012   Formatting of this note might be different from the original. Dilated CBD on abdominal imaging 04/2012   Chronic diarrhea 02/16/2013   Colitis    Complication of anesthesia    asthma attack after shoulder surgery   Diabetes mellitus    type II   Esophagitis    GERD (gastroesophageal reflux disease)    Heart murmur    for years- nothing to be concerned about.   History of kidney stones    Hx of arteriovenous malformation (AVM)    Hypertension    IBS (irritable bowel syndrome)    Loss of weight    Pernicious anemia    Personal history of colonic polyps 02/10/2013   02/08/13 colonoscopy - question of rectal varices, two 3-32mm polyps in the sigmoid colon, mass at the hepatic flexure, one 24mm polyp at the hepatic flexure, nodule at the ileocecal valve, congested mucusa in the entire colon, flattened villi mucosa in the terminal ileum.     Pneumonia    Psoriatic arthritis    s/p penicillamine, plaquenil, MTX, sulfasalazine.  s/p Embrel, Humira,.  Iritis.      Pure hypercholesterolemia    Rectal bleeding 07/15/2017   Stroke 05/29/2022   per patient    Past Surgical History:  Procedure Laterality Date   ABDOMINAL HYSTERECTOMY  1981   ovaries left in place   ANKLE SURGERY     right   BUNIONECTOMY     CHOLECYSTECTOMY  1989   COLONOSCOPY WITH PROPOFOL N/A 12/27/2017   Procedure: COLONOSCOPY WITH PROPOFOL;  Surgeon: Lin Landsman, MD;  Location: [facility] ENDOSCOPY;  Service: Gastroenterology;  Laterality: N/A;   COLONOSCOPY WITH PROPOFOL N/A 01/04/2019   Procedure: COLONOSCOPY WITH PROPOFOL;  Surgeon: Lin Landsman, MD;  Location: [place];  Service: Endoscopy;  Laterality: N/A;  Diabetic - oral and injectable   COLONOSCOPY WITH PROPOFOL N/A 07/24/2021   Procedure: COLONOSCOPY WITH PROPOFOL;  Surgeon: Lin Landsman, MD;  Location: [place];  Service: Endoscopy;  Laterality: N/A;  Diabetic   ESOPHAGOGASTRODUODENOSCOPY (EGD) WITH PROPOFOL N/A 12/27/2017   Procedure: ESOPHAGOGASTRODUODENOSCOPY (EGD) WITH PROPOFOL;  Surgeon: Lin Landsman, MD;  Location: [facility] ENDOSCOPY;  Service: Gastroenterology;  Laterality: N/A;   HAND SURGERY     right   HEEL SPUR SURGERY     IR 3D INDEPENDENT WKST  10/01/2021   IR ANGIO INTRA EXTRACRAN SEL COM CAROTID INNOMINATE UNI L MOD SED  10/01/2021   IR ANGIO INTRA EXTRACRAN SEL INTERNAL CAROTID UNI R MOD SED  10/01/2021   IR ANGIO INTRA EXTRACRAN  SEL INTERNAL CAROTID UNI R MOD SED  05/29/2022   IR ANGIO VERTEBRAL SEL VERTEBRAL BILAT MOD SED  10/01/2021   IR ANGIO VERTEBRAL SEL VERTEBRAL UNI R MOD SED  05/29/2022   IR CT HEAD LTD  10/01/2021   IR TRANSCATH/EMBOLIZ  10/01/2021   IR US GUIDE VASC ACCESS RIGHT  10/01/2021   IR US GUIDE VASC ACCESS RIGHT  05/29/2022   NOSE SURGERY     x2   POLYPECTOMY  01/04/2019   Procedure: POLYPECTOMY;  Surgeon: Lin Landsman, MD;  Location: [place];  Service: Endoscopy;;   POLYPECTOMY N/A 07/24/2021   Procedure: POLYPECTOMY;   Surgeon: Lin Landsman, MD;  Location: [place];  Service: Endoscopy;  Laterality: N/A;   RADIOLOGY WITH ANESTHESIA N/A 10/01/2021   Procedure: Starr Sinclair WITH ANESTHESIA;  Surgeon: Pedro Earls, MD;  Location: [place];  Service: Radiology;  Laterality: N/A;   SHOULDER ARTHROSCOPY WITH OPEN ROTATOR CUFF REPAIR Left 09/20/2018   Procedure: SHOULDER ARTHROSCOPY WITH OPEN ROTATOR CUFF REPAIR;  Surgeon: Corky Mull, MD;  Location: [facility];  Service: Orthopedics;  Laterality: Left;   SHOULDER CLOSED REDUCTION Left 12/21/2018   Procedure: MANIPULATION UNDER ANESTHESIA WITH STEROID INJECTION;  Surgeon: Corky Mull, MD;  Location: [place];  Service: Orthopedics;  Laterality: Left;  Diabetic - insulin and oral meds   UMBILICAL HERNIA REPAIR     XI ROBOTIC ASSISTED INGUINAL HERNIA REPAIR WITH MESH Left 12/16/2021   Procedure: XI ROBOTIC ASSISTED INGUINAL HERNIA REPAIR WITH MESH;  Surgeon: Olean Ree, MD;  Location: [facility];  Service: General;  Laterality: Left;    Family History  Problem Relation Age of Onset   Diabetes Other    Hypertension Other    Breast cancer Neg Hx    Colon cancer Neg Hx     Social History   Socioeconomic History   Marital status: Widowed    Spouse name: Not on file   Number of children: 2   Years of education: Not on file   Highest education level: Not on file  Occupational History   Not on file  Tobacco Use   Smoking status: Never   Smokeless tobacco: Never  Vaping Use   Vaping Use: Never used  Substance and Sexual Activity   Alcohol use: Never   Drug use: Never   Sexual activity: Not Currently  Other Topics Concern   Not on file  Social History Narrative   She is married and has two children   [place] daughter lives with her.   Social Determinants of Health   Financial Resource Strain: Low Risk  (05/13/2022)   Overall Financial Resource Strain (CARDIA)    Difficulty of Paying Living Expenses: Not hard  at all  Food Insecurity: No Food Insecurity (05/13/2022)   Hunger Vital Sign    Worried About Running Out of Food in the Last Year: Never true    Ran Out of Food in the Last Year: Never true  Transportation Needs: No Transportation Needs (05/13/2022)   PRAPARE - Hydrologist (Medical): No    Lack of Transportation (Non-Medical): No  Physical Activity: Not on file  Stress: No Stress Concern Present (05/13/2022)   [place]    Feeling of Stress : Not at all  Social Connections: Unknown (01/07/2021)   Social Connection and Isolation Panel [NHANES]    Frequency of Communication with Friends and Family: More than  three times a week    Frequency of Social Gatherings with Friends and Family: Not on file    Attends Religious Services: Not on file    Active Member of Clubs or Organizations: Not on file    Attends Archivist Meetings: Not on file    Marital Status: Not on file  Intimate Partner Violence: Not At Risk (05/13/2022)   Humiliation, Afraid, Rape, and Kick questionnaire    Fear of Current or Ex-Partner: No    Emotionally Abused: No    Physically Abused: No    Sexually Abused: No     Outpatient Medications Prior to Visit  Medication Sig Dispense Refill   acetaminophen (TYLENOL) 500 MG tablet Take 2 tablets (1,000 mg total) by mouth every 6 (six) hours as needed for mild pain.     albuterol (VENTOLIN HFA) 108 (90 Base) MCG/ACT inhaler Inhale 2 puffs into the lungs every 6 (six) hours as needed for wheezing or shortness of breath.     amLODipine (NORVASC) 5 MG tablet TAKE 1 TABLET(5 MG) BY MOUTH TWICE DAILY 180 tablet 1   budesonide-formoterol (SYMBICORT) 160-4.5 MCG/ACT inhaler Inhale 2 puffs into the lungs 2 (two) times daily. (Patient taking differently: Inhale 2 puffs into the lungs 2 (two) times daily as needed (shortness of breath).) 3 each 3   Cholecalciferol (VITAMIN D) 50 MCG  (2000 UT) tablet Take 2,000 Units by mouth daily.     CREON 36000-114000 units CPEP capsule Take 1-2 capsules (36,000-72,000 Units total) by mouth See admin instructions. Take 720000 units with each meal and 36000 units with snacks 240 capsule 3   diphenhydramine-acetaminophen (TYLENOL PM) 25-500 MG TABS tablet Take 2 tablets by mouth at bedtime as needed (sleep).     fluticasone (FLOVENT HFA) 110 MCG/ACT inhaler Inhale 2 puffs into the lungs 2 (two) times daily as needed (shortness of breath).     gabapentin (NEURONTIN) 600 MG tablet Take 600 mg by mouth daily.     insulin degludec (TRESIBA FLEXTOUCH) 100 UNIT/ML FlexTouch Pen Inject 16 Units into the skin daily. 12 mL 2   loperamide (IMODIUM) 2 MG capsule Take 4 mg by mouth daily.     losartan (COZAAR) 100 MG tablet TAKE 1 TABLET(100 MG) BY MOUTH AT BEDTIME 90 tablet 1   Melatonin 10 MG TABS Take 10 mg by mouth at bedtime.     nystatin cream (MYCOSTATIN) Apply 1 Application topically 2 (two) times daily. 30 g 0   omeprazole (PRILOSEC) 20 MG capsule TAKE 1 CAPSULE(20 MG) BY MOUTH TWICE DAILY 180 capsule 3   ONE TOUCH ULTRA TEST test strip PATIENT NEEDS NEW METER STRIPS AND LANCETS FOR ONE TOUCH. HER INSURANCE NO LONGER COVERS ACCUCHEK. 100 each PRN   rosuvastatin (CRESTOR) 10 MG tablet TAKE 1 TABLET(10 MG) BY MOUTH DAILY 90 tablet 3   No facility-administered medications prior to visit.    Allergies  Allergen Reactions   Aspirin Other (See Comments)    Increased bleeding   Beta Adrenergic Blockers     3rd degree blockage    Lisinopril Cough   Mtx Support [Cobalamine Combinations] Other (See Comments)    Respiratory symptoms   Vasotec [Enalapril] Cough   Verapamil Other (See Comments)    Heart block   Voltaren [Diclofenac Sodium]     Pt unsure of reaction    Erythromycin Other (See Comments)    Gi intolerance   Indocin [Indomethacin]     Upset stomach   Jardiance [Empagliflozin] Other (See Comments)  Recurrent yeast infections,  even with washout and rechallenge    ROS Review of Systems  Constitutional:  Negative for chills and fever.  HENT:  Positive for postnasal drip.   Respiratory:  Positive for cough. Negative for shortness of breath.   Neurological: Negative.   Psychiatric/Behavioral: Negative.        Objective:    Physical Exam Constitutional:      Appearance: Normal appearance.  HENT:     Right Ear: Tympanic membrane normal.     Left Ear: Tympanic membrane normal.     Mouth/Throat:     Pharynx: Oropharynx is clear.  Neurological:     Mental Status: She is alert.     BP 120/70   Pulse 90   Temp 98.3 F (36.8 C)   Resp 16   Ht  (1.727 m)   Wt 138 lb (62.6 kg)   SpO2 98%   BMI 20.98 kg/m  Wt Readings from Last 3 Encounters:  02/04/23 138 lb (62.6 kg)  02/02/23 139 lb 12.8 oz (63.4 kg)  01/26/23 136 lb (61.7 kg)     Health Maintenance  Topic Date Due   COVID-19 Vaccine (5 - 2023-24 season) 02/18/2023 (Originally 07/03/2022)   Hepatitis C Screening  02/27/2023 (Originally 01/24/1969)   Zoster Vaccines- Shingrix (1 of 2) 05/04/2023 (Originally 01/24/1970)   COLONOSCOPY (Pts 45-70yrs Insurance coverage will need to be confirmed)  10/17/2023 (Originally 07/24/2022)   Diabetic kidney evaluation - Urine ACR  04/17/2023   OPHTHALMOLOGY EXAM  05/01/2023   Medicare Annual Wellness (AWV)  05/14/2023   INFLUENZA VACCINE  06/03/2023   HEMOGLOBIN A1C  07/25/2023   FOOT EXAM  10/17/2023   Diabetic kidney evaluation - eGFR measurement  01/22/2024   MAMMOGRAM  06/19/2024   Pneumonia Vaccine 4+ Years old  Completed   DEXA SCAN  Completed   HPV VACCINES  Aged Out   DTaP/Tdap/Td  Discontinued    There are no preventive care reminders to display for this patient.  Lab Results  Component Value Date   TSH 2.021 07/02/2022   Lab Results  Component Value Date   WBC 5.9 08/03/2022   HGB 11.5 (L) 08/03/2022   HCT 35.0 (L) 08/03/2022   MCV 93.1 08/03/2022   PLT 221.0 08/03/2022   Lab  Results  Component Value Date   NA 145 01/22/2023   K 4.1 01/22/2023   CO2 30 01/22/2023   GLUCOSE 44 (LL) 01/22/2023   BUN 18 01/22/2023   CREATININE 0.98 01/22/2023   BILITOT 0.3 02/02/2023   ALKPHOS 105 02/02/2023   AST 20 02/02/2023   ALT 15 02/02/2023   PROT 5.6 (L) 02/02/2023   ALBUMIN 3.6 02/02/2023   CALCIUM 8.5 01/22/2023   ANIONGAP 7 07/02/2022   EGFR 58 (L) 07/17/2021   GFR 57.87 (L) 01/22/2023   Lab Results  Component Value Date   CHOL 127 01/22/2023   Lab Results  Component Value Date   HDL 55.80 01/22/2023   Lab Results  Component Value Date   LDLCALC 50 01/22/2023   Lab Results  Component Value Date   TRIG 106.0 01/22/2023   Lab Results  Component Value Date   CHOLHDL 2 01/22/2023   Lab Results  Component Value Date   HGBA1C 6.4 01/22/2023      Assessment & Plan:  Acute cough Assessment & Plan: Rx of antibiotic and cough syrup sent to the pharmacy. Advised patient to continue inhaler. Increase fluid intake. Use humidifier or steam   Other  orders -     Doxycycline Hyclate; Take 1 tablet (100 mg total) by mouth 2 (two) times daily for 7 days.  Dispense: 14 tablet; Refill: 0 -     HYDROcodone Bit-Homatrop MBr; Take 5 mLs by mouth at bedtime as needed for cough.  Dispense: 30 mL; Refill: 0    Follow-up: No follow-ups on file.   Kara Dies, NP

## 2023-02-04 NOTE — Patient Instructions (Addendum)
Doxycycline  and Hycodan sent to pharmacy. Use albuterol nebulizer once a day and continue Symbicort and albuterol inhaler as needed. Increase hydration and use steam /humidifier.

## 2023-02-05 ENCOUNTER — Other Ambulatory Visit: Payer: Self-pay | Admitting: *Deleted

## 2023-02-05 DIAGNOSIS — M7989 Other specified soft tissue disorders: Secondary | ICD-10-CM

## 2023-02-05 DIAGNOSIS — L909 Atrophic disorder of skin, unspecified: Secondary | ICD-10-CM | POA: Diagnosis not present

## 2023-02-05 DIAGNOSIS — Q6689 Other  specified congenital deformities of feet: Secondary | ICD-10-CM | POA: Diagnosis not present

## 2023-02-05 DIAGNOSIS — M778 Other enthesopathies, not elsewhere classified: Secondary | ICD-10-CM | POA: Diagnosis not present

## 2023-02-05 DIAGNOSIS — L600 Ingrowing nail: Secondary | ICD-10-CM | POA: Diagnosis not present

## 2023-02-07 ENCOUNTER — Encounter: Payer: Self-pay | Admitting: Internal Medicine

## 2023-02-07 DIAGNOSIS — R059 Cough, unspecified: Secondary | ICD-10-CM | POA: Diagnosis not present

## 2023-02-07 DIAGNOSIS — J45901 Unspecified asthma with (acute) exacerbation: Secondary | ICD-10-CM | POA: Diagnosis not present

## 2023-02-07 DIAGNOSIS — R062 Wheezing: Secondary | ICD-10-CM | POA: Diagnosis not present

## 2023-02-07 DIAGNOSIS — R058 Other specified cough: Secondary | ICD-10-CM | POA: Diagnosis not present

## 2023-02-07 DIAGNOSIS — Z03818 Encounter for observation for suspected exposure to other biological agents ruled out: Secondary | ICD-10-CM | POA: Diagnosis not present

## 2023-02-07 DIAGNOSIS — R509 Fever, unspecified: Secondary | ICD-10-CM | POA: Diagnosis not present

## 2023-02-07 NOTE — Assessment & Plan Note (Signed)
S/p colonoscopy 07/24/21 as outlined.  Continues f/u with GI.  Started entyvio.  Is helping.  Bleeding as outlined - prior to dose.  Follow.  Keep f/u with GI.

## 2023-02-07 NOTE — Assessment & Plan Note (Signed)
On amlodipine and losartan.  Follow pressures.  Follow metabolic panel.  

## 2023-02-07 NOTE — Assessment & Plan Note (Signed)
On gabapentin.  With increased nocturia.  Not rested when wakes up.  Discussed possible sleep apnea.  Discussed swelling, etc.  Will refer to pulmonary for evaluation - question of sleep apnea.

## 2023-02-07 NOTE — Assessment & Plan Note (Addendum)
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  [facility] - 01/2023 - plan MRI/MRA

## 2023-02-07 NOTE — Assessment & Plan Note (Signed)
Saw Dr Yarbrough [facility] - 01/2023 - At this point, she is asymptomatic. He recommended follow-up imaging. His office will arrange for MRI scan of the brain as well as MR angiogram of the brain in the summer. He will touch base with her via telephone after that is performed. If her findings are stable, she will likely follow with yearly imaging.  

## 2023-02-07 NOTE — Assessment & Plan Note (Addendum)
Continue omeprazole. No increased acid reflux.

## 2023-02-07 NOTE — Assessment & Plan Note (Signed)
[facility]U - 01/2023 - plan MRI/MRA

## 2023-02-07 NOTE — Assessment & Plan Note (Signed)
Stable.  Has been followed by rheumatology.   

## 2023-02-07 NOTE — Assessment & Plan Note (Signed)
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  MRA head and neck were negative.  ECHO - no evidence of embolic source. With history of significant GI bleed in the past due to AVM, polyp bleed.

## 2023-02-07 NOTE — Assessment & Plan Note (Signed)
Breathing stable.

## 2023-02-07 NOTE — Assessment & Plan Note (Signed)
Saw Dr Myer Haff [facility] - 01/2023 - At this point, she is asymptomatic. He recommended follow-up imaging. His office will arrange for MRI scan of the brain as well as MR angiogram of the brain in the summer. He will touch base with her via telephone after that is performed. If her findings are stable, she will likely follow with yearly imaging.

## 2023-02-07 NOTE — Assessment & Plan Note (Signed)
Discussed.  Overall appears to be handling things relatively well.  Will notify if feels needs further intervention.  Follow.  

## 2023-02-07 NOTE — Assessment & Plan Note (Signed)
Compression hose.  ECHO 05/2022 - EF 65% with grade I DD.  Leg elevation.  Monitor salt intake.  Requested referral to vascular surgery. Refer to [facility].

## 2023-02-07 NOTE — Assessment & Plan Note (Signed)
On insulin.  Follow sugars. Follow met b and A1c. Low carb diet and exercise. Titrating down insulin dose - due to low sugars.  Follow.  Send in readings.

## 2023-02-07 NOTE — Assessment & Plan Note (Signed)
Continue crestor. Follow lipid panel and liver function tests.   

## 2023-02-07 NOTE — Assessment & Plan Note (Signed)
Increased stress with the move and underlying medical issues and financial issues.  Discussed.  Has good support.  Follow. Notify if feels needs further intervention.  

## 2023-02-08 ENCOUNTER — Ambulatory Visit (HOSPITAL_COMMUNITY)
Admission: RE | Admit: 2023-02-08 | Discharge: 2023-02-08 | Disposition: A | Discharge status: Home / Self Care | Payer: PPO | Source: Ambulatory Visit | Attending: Surgery | Admitting: Surgery

## 2023-02-08 DIAGNOSIS — M7989 Other specified soft tissue disorders: Secondary | ICD-10-CM | POA: Diagnosis not present

## 2023-02-15 ENCOUNTER — Encounter: Payer: Self-pay | Admitting: Nurse Practitioner

## 2023-02-15 DIAGNOSIS — R051 Acute cough: Secondary | ICD-10-CM | POA: Insufficient documentation

## 2023-02-15 DIAGNOSIS — R059 Cough, unspecified: Secondary | ICD-10-CM | POA: Insufficient documentation

## 2023-02-15 NOTE — Assessment & Plan Note (Signed)
Rx of antibiotic and cough syrup sent to the pharmacy. Advised patient to continue inhaler. Increase fluid intake. Use humidifier or steam

## 2023-02-16 ENCOUNTER — Telehealth: Payer: Self-pay | Admitting: Internal Medicine

## 2023-02-16 ENCOUNTER — Ambulatory Visit: Payer: PPO | Admitting: Vascular Surgery

## 2023-02-16 ENCOUNTER — Encounter: Payer: Self-pay | Admitting: Vascular Surgery

## 2023-02-16 VITALS — BP 131/66 | HR 73 | Temp 98.6°F | Resp 20 | Ht 68.0 in | Wt 139.0 lb

## 2023-02-16 DIAGNOSIS — I872 Venous insufficiency (chronic) (peripheral): Secondary | ICD-10-CM | POA: Diagnosis not present

## 2023-02-16 NOTE — Telephone Encounter (Signed)
Pt called in staying that she saw the NP here 2 weeks ago for an upper respiratory infections, and they prescribed her antibiotics, however, as per pt she wants to let Dr. Lorin Picket knows that she went to the walk in clinic because wasn't feeling good and they said she has pneumonia and a temperature of 103.   Pt also wants to F/U with provider or her assistant after her visit today with Dr. Lenell Antu [facility] [place]. As per pt, she need a note from provider to be sent over to Dr. Lenell Antu. Any questions or concern, she's available [phone].

## 2023-02-16 NOTE — Telephone Encounter (Signed)
Patient confirmed she is not running a fever anymore- been afebrile since Saturday. Lingering cough and hoarseness. Other wise patient is doing okay. Sees pulmonary Friday. Has f/u with you 4/29  Regarding note to vascular- she says they need one of your office notes (recent) to state that compression hose were recommended, tried and failed in order for her insurance to cover the ablasion she needs to have done for her leg swelling. She is not seeing them back until July so can discuss at 4/29 appt if needed.

## 2023-02-16 NOTE — Progress Notes (Unsigned)
VASCULAR AND VEIN SPECIALISTS OF [place]  ASSESSMENT / PLAN: 72 y.o. female with venous insufficiency of bilateral lower extremities.  She has reflux of the saphenofemoral junction and the greater saphenous vein at the knee on the right side.  She reports she has been in compression for greater than 3 months.  We will try to secure these notes to help expedite her workup.  Otherwise she will need 3 months of compression therapy.  She will follow-up with one of my partners who performs venous ablations to discuss her candidacy for the same.  CHIEF COMPLAINT: ***  HISTORY OF PRESENT ILLNESS: Ashley Gross is a 72 y.o. female ***  VASCULAR SURGICAL HISTORY: ***  VASCULAR RISK FACTORS: {FINDINGS; POSITIVE NEGATIVE:(772) 190-1132} history of stroke / transient ischemic attack. {FINDINGS; POSITIVE NEGATIVE:(772) 190-1132} history of coronary artery disease. *** history of PCI. *** history of CABG.  {FINDINGS; POSITIVE NEGATIVE:(772) 190-1132} history of diabetes mellitus. Last A1c ***. {FINDINGS; POSITIVE NEGATIVE:(772) 190-1132} history of smoking. *** actively smoking. {FINDINGS; POSITIVE NEGATIVE:(772) 190-1132} history of hypertension. *** drug regimen with *** control. {FINDINGS; POSITIVE NEGATIVE:(772) 190-1132} history of chronic kidney disease.  Last GFR ***. CKD {stage:30421363}. {FINDINGS; POSITIVE NEGATIVE:(772) 190-1132} history of chronic obstructive pulmonary disease, treated with ***.  FUNCTIONAL STATUS: ECOG performance status: {findings; ecog performance status:31780} Ambulatory status: {TNHAmbulation:25868}  CAREY 1 AND 3 YEAR INDEX Female (2pts) 75-79 or 80-84 (2pts) >84 (3pts) Dependence in toileting (1pt) Partial or full dependence in dressing (1pt) History of malignant neoplasm (2pts) CHF (3pts) COPD (1pts) CKD (3pts)  0-3 pts 6% 1 year mortality ; 21% 3 year mortality 4-5 pts 12% 1 year mortality ; 36% 3 year mortality >5 pts 21% 1 year mortality; 54% 3 year mortality   Past Medical  History:  Diagnosis Date   Abnormal liver function test 10/06/2012   Anemia 01/25/2022   Anemia, unspecified    Asthma    B12 deficiency 02/11/2019   Bleeding internal hemorrhoids 09/13/2017   Choledocholithiasis 06/29/2012   Formatting of this note might be different from the original. Dilated CBD on abdominal imaging 04/2012   Chronic diarrhea 02/16/2013   Colitis    Complication of anesthesia    asthma attack after shoulder surgery   Diabetes mellitus    type II   Esophagitis    GERD (gastroesophageal reflux disease)    Heart murmur    for years- nothing to be concerned about.   History of kidney stones    Hx of arteriovenous malformation (AVM)    Hypertension    IBS (irritable bowel syndrome)    Loss of weight    Pernicious anemia    Personal history of colonic polyps 02/10/2013   02/08/13 colonoscopy - question of rectal varices, two 3-48mm polyps in the sigmoid colon, mass at the hepatic flexure, one 5mm polyp at the hepatic flexure, nodule at the ileocecal valve, congested mucusa in the entire colon, flattened villi mucosa in the terminal ileum.     Pneumonia    Psoriatic arthritis    s/p penicillamine, plaquenil, MTX, sulfasalazine.  s/p Embrel, Humira,.  Iritis.     Pure hypercholesterolemia    Rectal bleeding 07/15/2017   Stroke 05/29/2022   per patient    Past Surgical History:  Procedure Laterality Date   ABDOMINAL HYSTERECTOMY  1981   ovaries left in place   ANKLE SURGERY     right   BUNIONECTOMY     CHOLECYSTECTOMY  1989   COLONOSCOPY WITH PROPOFOL N/A 12/27/2017   Procedure: COLONOSCOPY WITH PROPOFOL;  Surgeon: Allegra Lai,  Loel Dubonnet, MD;  Location: [facility] ENDOSCOPY;  Service: Gastroenterology;  Laterality: N/A;   COLONOSCOPY WITH PROPOFOL N/A 01/04/2019   Procedure: COLONOSCOPY WITH PROPOFOL;  Surgeon: Toney Reil, MD;  [facility];  Service: Endoscopy;  Laterality: N/A;  Diabetic - oral and injectable   COLONOSCOPY WITH PROPOFOL N/A  07/24/2021   Procedure: COLONOSCOPY WITH PROPOFOL;  Surgeon: Toney Reil, MD;  Location: [facility];  Service: Endoscopy;  Laterality: N/A;  Diabetic   ESOPHAGOGASTRODUODENOSCOPY (EGD) WITH PROPOFOL N/A 12/27/2017   Procedure: ESOPHAGOGASTRODUODENOSCOPY (EGD) WITH PROPOFOL;  Surgeon: Toney Reil, MD;  Location: [facility] END[facility];  Service: Gastroenterology;  Laterality: N/A;   HAND SURGERY     right   HEEL SPUR SURGERY     IR 3D INDEPENDENT WKST  10/01/2021   IR ANGIO INTRA EXTRACRAN SEL COM CAROTID INNOMINATE UNI L MOD SED  10/01/2021   IR ANGIO INTRA EXTRACRAN SEL INTERNAL CAROTID UNI R MOD SED  10/01/2021   IR ANGIO INTRA EXTRACRAN SEL INTERNAL CAROTID UNI R MOD SED  05/29/2022   IR ANGIO VERTEBRAL SEL VERTEBRAL BILAT MOD SED  10/01/2021   IR ANGIO VERTEBRAL SEL VERTEBRAL UNI R MOD SED  05/29/2022   IR CT HEAD LTD  10/01/2021   IR TRANSCATH/EMBOLIZ  10/01/2021   IR US GUIDE VASC ACCESS RIGHT  10/01/2021   IR US GUIDE VASC ACCESS RIGHT  05/29/2022   NOSE SURGERY     x2   POLYPECTOMY  01/04/2019   Procedure: POLYPECTOMY;  Surgeon: Toney Reil, MD;  Location: [facility];  Service: Endoscopy;;   POLYPECTOMY N/A 07/24/2021   Procedure: POLYPECTOMY;  Surgeon: Toney Reil, MD;  Location: [facility];  Service: Endoscopy;  Laterality: N/A;   RADIOLOGY WITH ANESTHESIA N/A 10/01/2021   Procedure: Alfredia Client WITH ANESTHESIA;  Surgeon: Baldemar Lenis, MD;  Location: [facility] OR;  Service: Radiology;  Laterality: N/A;   SHOULDER ARTHROSCOPY WITH OPEN ROTATOR CUFF REPAIR Left 09/20/2018   Procedure: SHOULDER ARTHROSCOPY WITH OPEN ROTATOR CUFF REPAIR;  Surgeon: Christena Flake, MD;  Location: [facility] ORS;  Service: Orthopedics;  Laterality: Left;   SHOULDER CLOSED REDUCTION Left 12/21/2018   Procedure: MANIPULATION UNDER ANESTHESIA WITH STEROID INJECTION;  Surgeon: Christena Flake, MD;  Location: [facility];  Service: Orthopedics;   Laterality: Left;  Diabetic - insulin and oral meds   UMBILICAL HERNIA REPAIR     XI ROBOTIC ASSISTED INGUINAL HERNIA REPAIR WITH MESH Left 12/16/2021   Procedure: XI ROBOTIC ASSISTED INGUINAL HERNIA REPAIR WITH MESH;  Surgeon: [name], MD;  Location: [facility] ORS;  Service: General;  Laterality: Left;    Family History  Problem Relation Age of Onset   Diabetes Other    Hypertension Other    Breast cancer Neg Hx    Colon cancer Neg Hx     Social History   Socioeconomic History   Marital status: Widowed    Spouse name: Not on file   Number of children: 2   Years of education: Not on file   Highest education level: Not on file  Occupational History   Not on file  Tobacco Use   Smoking status: Never   Smokeless tobacco: Never  Vaping Use   Vaping Use: Never used  Substance and Sexual Activity   Alcohol use: Never   Drug use: Never   Sexual activity: Not Currently  Other Topics Concern   Not on file  Social History Narrative   She is  married and has two children   Grand daughter lives with her.   Social Determinants of Health   Financial Resource Strain: Low Risk  (05/13/2022)   Overall Financial Resource Strain (CARDIA)    Difficulty of Paying Living Expenses: Not hard at all  Food Insecurity: No Food Insecurity (05/13/2022)   Hunger Vital Sign    Worried About Running Out of Food in the Last Year: Never true    Ran Out of Food in the Last Year: Never true  Transportation Needs: No Transportation Needs (05/13/2022)   PRAPARE - Administrator, Civil Service (Medical): No    Lack of Transportation (Non-Medical): No  Physical Activity: Not on file  Stress: No Stress Concern Present (05/13/2022)   [employer] of Occupational Health - Occupational Stress Questionnaire    Feeling of Stress : Not at all  Social Connections: Unknown (01/07/2021)   Social Connection and Isolation Panel [NHANES]    Frequency of Communication with Friends and Family: More than  three times a week    Frequency of Social Gatherings with Friends and Family: Not on file    Attends Religious Services: Not on file    Active Member of Clubs or Organizations: Not on file    Attends Banker Meetings: Not on file    Marital Status: Not on file  Intimate Partner Violence: Not At Risk (05/13/2022)   Humiliation, Afraid, Rape, and Kick questionnaire    Fear of Current or Ex-Partner: No    Emotionally Abused: No    Physically Abused: No    Sexually Abused: No    Allergies  Allergen Reactions   Aspirin Other (See Comments)    Increased bleeding   Beta Adrenergic Blockers     3rd degree blockage    Lisinopril Cough   Mtx Support [Cobalamine Combinations] Other (See Comments)    Respiratory symptoms   Vasotec [Enalapril] Cough   Verapamil Other (See Comments)    Heart block   Voltaren [Diclofenac Sodium]     Pt unsure of reaction    Erythromycin Other (See Comments)    Gi intolerance   Indocin [Indomethacin]     Upset stomach   Jardiance [Empagliflozin] Other (See Comments)    Recurrent yeast infections, even with washout and rechallenge    Current Outpatient Medications  Medication Sig Dispense Refill   acetaminophen (TYLENOL) 500 MG tablet Take 2 tablets (1,000 mg total) by mouth every 6 (six) hours as needed for mild pain.     albuterol (VENTOLIN HFA) 108 (90 Base) MCG/ACT inhaler Inhale 2 puffs into the lungs every 6 (six) hours as needed for wheezing or shortness of breath.     amLODipine (NORVASC) 5 MG tablet TAKE 1 TABLET(5 MG) BY MOUTH TWICE DAILY 180 tablet 1   budesonide-formoterol (SYMBICORT) 160-4.5 MCG/ACT inhaler Inhale 2 puffs into the lungs 2 (two) times daily. (Patient taking differently: Inhale 2 puffs into the lungs 2 (two) times daily as needed (shortness of breath).) 3 each 3   Cholecalciferol (VITAMIN D) 50 MCG (2000 UT) tablet Take 2,000 Units by mouth daily.     CREON 36000-114000 units CPEP capsule Take 1-2 capsules  (36,000-72,000 Units total) by mouth See admin instructions. Take 720000 units with each meal and 32761 units with snacks 240 capsule 3   diphenhydramine-acetaminophen (TYLENOL PM) 25-500 MG TABS tablet Take 2 tablets by mouth at bedtime as needed (sleep).     fluticasone (FLOVENT HFA) 110 MCG/ACT inhaler Inhale 2 puffs into  the lungs 2 (two) times daily as needed (shortness of breath).     gabapentin (NEURONTIN) 600 MG tablet Take 600 mg by mouth daily.     insulin degludec (TRESIBA FLEXTOUCH) 100 UNIT/ML FlexTouch Pen Inject 16 Units into the skin daily. 12 mL 2   loperamide (IMODIUM) 2 MG capsule Take 4 mg by mouth daily.     losartan (COZAAR) 100 MG tablet TAKE 1 TABLET(100 MG) BY MOUTH AT BEDTIME 90 tablet 1   Melatonin 10 MG TABS Take 10 mg by mouth at bedtime.     nystatin cream (MYCOSTATIN) Apply 1 Application topically 2 (two) times daily. 30 g 0   omeprazole (PRILOSEC) 20 MG capsule TAKE 1 CAPSULE(20 MG) BY MOUTH TWICE DAILY 180 capsule 3   ONE TOUCH ULTRA TEST test strip PATIENT NEEDS NEW METER STRIPS AND LANCETS FOR ONE TOUCH. HER INSURANCE NO LONGER COVERS ACCUCHEK. 100 each PRN   rosuvastatin (CRESTOR) 10 MG tablet TAKE 1 TABLET(10 MG) BY MOUTH DAILY 90 tablet 3   No current facility-administered medications for this visit.    PHYSICAL EXAM Vitals:   02/16/23 1438  BP: 131/66  Pulse: 73  Resp: 20  Temp: 98.6 F (37 C)  SpO2: 99%  Weight: 139 lb (63 kg)  Height: 5\' 8"  (1.727 m)    Constitutional: *** appearing. *** distress. Appears *** nourished.  Neurologic: CN ***. *** focal findings. *** sensory loss. Psychiatric: *** Mood and affect symmetric and appropriate. Eyes: *** No icterus. No conjunctival pallor. Ears, nose, throat: *** mucous membranes moist. Midline trachea.  Cardiac: *** rate and rhythm.  Respiratory: *** unlabored. Abdominal: *** soft, non-tender, non-distended.  Peripheral vascular: *** Extremity: *** edema. *** cyanosis. *** pallor.  Skin: ***  gangrene. *** ulceration.  Lymphatic: *** Stemmer's sign. *** palpable lymphadenopathy.    PERTINENT LABORATORY AND RADIOLOGIC DATA  Most recent CBC    Latest Ref Rng & Units 08/03/2022    3:12 PM 07/07/2022   11:51 AM 07/02/2022   11:44 AM  CBC  WBC 4.0 - 10.5 K/uL 5.9  6.5  4.9   Hemoglobin 12.0 - 15.0 g/dL 40.9  81.1  91.4   Hematocrit 36.0 - 46.0 % 35.0  35.7  35.2   Platelets 150.0 - 400.0 K/uL 221.0  228.0  225      Most recent CMP    Latest Ref Rng & Units 02/02/2023    3:06 PM 01/22/2023    9:22 AM 10/12/2022    8:06 AM  CMP  Glucose 70 - 99 mg/dL  44  782   BUN 6 - 23 mg/dL  18  14   Creatinine 9.56 - 1.20 mg/dL  2.13  0.86   Sodium 578 - 145 mEq/L  145  142   Potassium 3.5 - 5.1 mEq/L  4.1  4.2   Chloride 96 - 112 mEq/L  108  104   CO2 19 - 32 mEq/L  30  30   Calcium 8.4 - 10.5 mg/dL  8.5  8.7   Total Protein 6.0 - 8.3 g/dL 5.6  5.7  5.7   Total Bilirubin 0.2 - 1.2 mg/dL 0.3  0.5  0.5   Alkaline Phos 39 - 117 U/L 105  119  91   AST 0 - 37 U/L 20  33  18   ALT 0 - 35 U/L 15  16  11      Renal function CrCl cannot be calculated (Patient's most recent lab result is older than the maximum  21 days allowed.).  Hgb A1c MFr Bld (%)  Date Value  01/22/2023 6.4    LDL Cholesterol (Calc)  Date Value Ref Range Status  11/01/2018 56 mg/dL (calc) Final    Comment:    Reference range: <100 . Desirable range <100 mg/dL for primary prevention;   <70 mg/dL for patients with CHD or diabetic patients  with > or = 2 CHD risk factors. Marland Kitchen LDL-C is now calculated using the Martin-Hopkins  calculation, which is a validated novel method providing  better accuracy than the Friedewald equation in the  estimation of LDL-C.  Horald Pollen et al. Lenox Ahr. 0981;191(47): 2061-2068  (http://education.[facility].com/faq/FAQ164)    LDL Cholesterol  Date Value Ref Range Status  01/22/2023 50 0 - 99 mg/dL Final   Direct LDL  Date Value Ref Range Status  04/19/2019 74.0 mg/dL Final     Comment:    Optimal:  <100 mg/dLNear or Above Optimal:  100-129 mg/dLBorderline High:  130-159 mg/dLHigh:  160-189 mg/dLVery High:  >190 mg/dL     Vascular Imaging: ***  Mihika Surrette N. Lenell Antu, MD [facility] V[facility] Phone Number: [phone] 02/16/2023 5:29 PM   Total time spent on preparing this encounter including chart review, data review, collecting history, examining the patient, coordinating care for this {tnhtimebilling:26202}  Portions of this report may have been transcribed using voice recognition software.  Every effort has been made to ensure accuracy; however, inadvertent computerized transcription errors may still be present.

## 2023-02-16 NOTE — Telephone Encounter (Signed)
Noted. Will discuss at upcoming appt documentation needed for vascular.  Per discussion, offered earlier appt if needed.  Will notify if needs to be seen earlier.

## 2023-02-18 ENCOUNTER — Emergency Department: Payer: PPO

## 2023-02-18 ENCOUNTER — Inpatient Hospital Stay
Admission: EM | Admit: 2023-02-18 | Discharge: 2023-02-21 | DRG: 202 | Disposition: A | Discharge status: Home / Self Care | Payer: PPO | Attending: Internal Medicine | Admitting: Internal Medicine

## 2023-02-18 DIAGNOSIS — K219 Gastro-esophageal reflux disease without esophagitis: Secondary | ICD-10-CM | POA: Diagnosis not present

## 2023-02-18 DIAGNOSIS — Z1152 Encounter for screening for COVID-19: Secondary | ICD-10-CM

## 2023-02-18 DIAGNOSIS — R0602 Shortness of breath: Secondary | ICD-10-CM | POA: Diagnosis not present

## 2023-02-18 DIAGNOSIS — Z8249 Family history of ischemic heart disease and other diseases of the circulatory system: Secondary | ICD-10-CM | POA: Diagnosis not present

## 2023-02-18 DIAGNOSIS — Z79899 Other long term (current) drug therapy: Secondary | ICD-10-CM | POA: Diagnosis not present

## 2023-02-18 DIAGNOSIS — Z90710 Acquired absence of both cervix and uterus: Secondary | ICD-10-CM

## 2023-02-18 DIAGNOSIS — Z87442 Personal history of urinary calculi: Secondary | ICD-10-CM | POA: Diagnosis not present

## 2023-02-18 DIAGNOSIS — Z888 Allergy status to other drugs, medicaments and biological substances status: Secondary | ICD-10-CM

## 2023-02-18 DIAGNOSIS — Z886 Allergy status to analgesic agent status: Secondary | ICD-10-CM

## 2023-02-18 DIAGNOSIS — J45901 Unspecified asthma with (acute) exacerbation: Principal | ICD-10-CM | POA: Diagnosis present

## 2023-02-18 DIAGNOSIS — Z8701 Personal history of pneumonia (recurrent): Secondary | ICD-10-CM

## 2023-02-18 DIAGNOSIS — Z86010 Personal history of colonic polyps: Secondary | ICD-10-CM

## 2023-02-18 DIAGNOSIS — E7800 Pure hypercholesterolemia, unspecified: Secondary | ICD-10-CM | POA: Diagnosis not present

## 2023-02-18 DIAGNOSIS — Z9049 Acquired absence of other specified parts of digestive tract: Secondary | ICD-10-CM

## 2023-02-18 DIAGNOSIS — Z7951 Long term (current) use of inhaled steroids: Secondary | ICD-10-CM | POA: Diagnosis not present

## 2023-02-18 DIAGNOSIS — E1165 Type 2 diabetes mellitus with hyperglycemia: Secondary | ICD-10-CM | POA: Diagnosis not present

## 2023-02-18 DIAGNOSIS — Z8673 Personal history of transient ischemic attack (TIA), and cerebral infarction without residual deficits: Secondary | ICD-10-CM

## 2023-02-18 DIAGNOSIS — R6510 Systemic inflammatory response syndrome (SIRS) of non-infectious origin without acute organ dysfunction: Secondary | ICD-10-CM

## 2023-02-18 DIAGNOSIS — J9601 Acute respiratory failure with hypoxia: Secondary | ICD-10-CM | POA: Diagnosis present

## 2023-02-18 DIAGNOSIS — Z794 Long term (current) use of insulin: Secondary | ICD-10-CM | POA: Diagnosis not present

## 2023-02-18 DIAGNOSIS — K589 Irritable bowel syndrome without diarrhea: Secondary | ICD-10-CM | POA: Diagnosis not present

## 2023-02-18 DIAGNOSIS — R509 Fever, unspecified: Secondary | ICD-10-CM

## 2023-02-18 DIAGNOSIS — Z881 Allergy status to other antibiotic agents status: Secondary | ICD-10-CM

## 2023-02-18 DIAGNOSIS — A419 Sepsis, unspecified organism: Secondary | ICD-10-CM | POA: Diagnosis present

## 2023-02-18 DIAGNOSIS — R059 Cough, unspecified: Secondary | ICD-10-CM | POA: Diagnosis not present

## 2023-02-18 DIAGNOSIS — I10 Essential (primary) hypertension: Secondary | ICD-10-CM | POA: Diagnosis present

## 2023-02-18 DIAGNOSIS — Z833 Family history of diabetes mellitus: Secondary | ICD-10-CM | POA: Diagnosis not present

## 2023-02-18 LAB — BASIC METABOLIC PANEL
Anion gap: 8 (ref 5–15)
BUN: 11 mg/dL (ref 8–23)
CO2: 23 mmol/L (ref 22–32)
Calcium: 8 mg/dL — ABNORMAL LOW (ref 8.9–10.3)
Chloride: 105 mmol/L (ref 98–111)
Creatinine, Ser: 0.84 mg/dL (ref 0.44–1.00)
GFR, Estimated: >60 mL/min (ref >60)
Glucose, Bld: 133 mg/dL — ABNORMAL HIGH (ref 70–99)
Potassium: 3.5 mmol/L (ref 3.5–5.1)
Sodium: 136 mmol/L (ref 135–145)

## 2023-02-18 LAB — CBC WITH DIFFERENTIAL/PLATELET
Abs Immature Granulocytes: 0.05 10*3/uL (ref 0.00–0.07)
Basophils Absolute: 0 10*3/uL (ref 0.0–0.1)
Basophils Relative: 0 %
Eosinophils Absolute: 0.2 10*3/uL (ref 0.0–0.5)
Eosinophils Relative: 1 %
HCT: 32.8 % — ABNORMAL LOW (ref 36.0–46.0)
Hemoglobin: 10.6 g/dL — ABNORMAL LOW (ref 12.0–15.0)
Immature Granulocytes: 0 %
Lymphocytes Relative: 13 %
Lymphs Abs: 1.8 10*3/uL (ref 0.7–4.0)
MCH: 28.3 pg (ref 26.0–34.0)
MCHC: 32.3 g/dL (ref 30.0–36.0)
MCV: 87.5 fL (ref 80.0–100.0)
Monocytes Absolute: 0.6 10*3/uL (ref 0.1–1.0)
Monocytes Relative: 5 %
Neutro Abs: 10.7 10*3/uL — ABNORMAL HIGH (ref 1.7–7.7)
Neutrophils Relative %: 81 %
Platelets: 268 10*3/uL (ref 150–400)
RBC: 3.75 MIL/uL — ABNORMAL LOW (ref 3.87–5.11)
RDW: 14.3 % (ref 11.5–15.5)
WBC: 13.3 10*3/uL — ABNORMAL HIGH (ref 4.0–10.5)
nRBC: 0 % (ref 0.0–0.2)

## 2023-02-18 LAB — TROPONIN I (HIGH SENSITIVITY): Troponin I (High Sensitivity): 15 ng/L (ref <18)

## 2023-02-18 LAB — SARS CORONAVIRUS 2 BY RT PCR: SARS Coronavirus 2 by RT PCR: NEGATIVE

## 2023-02-18 MED ORDER — METHYLPREDNISOLONE SODIUM SUCC 125 MG IJ SOLR
125.0000 mg | Freq: Once | INTRAMUSCULAR | Status: AC
  Administered 2023-02-19: 125 mg via INTRAVENOUS
  Filled 2023-02-18: qty 2

## 2023-02-18 MED ORDER — LACTATED RINGERS IV SOLN: INTRAVENOUS | Status: DC

## 2023-02-18 MED ORDER — LEVOFLOXACIN IN D5W 750 MG/150ML IV SOLN
750.0000 mg | Freq: Once | INTRAVENOUS | Status: AC
  Administered 2023-02-19: 750 mg via INTRAVENOUS
  Filled 2023-02-18: qty 150

## 2023-02-18 MED ORDER — IPRATROPIUM-ALBUTEROL 0.5-2.5 (3) MG/3ML IN SOLN
3.0000 mL | Freq: Once | RESPIRATORY_TRACT | Status: AC
  Administered 2023-02-19: 3 mL via RESPIRATORY_TRACT
  Filled 2023-02-18: qty 6

## 2023-02-18 MED ORDER — LACTATED RINGERS IV BOLUS (SEPSIS)
1000.0000 mL | Freq: Once | INTRAVENOUS | Status: AC
  Administered 2023-02-19: 1000 mL via INTRAVENOUS

## 2023-02-18 NOTE — Patient Instructions (Signed)
  Sleep apnea is defined as period of 10 seconds or longer when you stop breathing at night. This can happen multiple times a night. Dx sleep apnea is when this occurs more than 5 times an hour.    Mild OSA 5-15 apneic events an hour Moderate OSA 15-30 apneic events an hour Severe OSA > 30 apneic events an hour   Untreated sleep apnea puts you at higher risk for cardiac arrhythmias, pulmonary HTN, stroke and diabetes  Treatment options include weight loss, side sleeping position, oral appliance, CPAP therapy or referral to ENT for possible surgical options    Recommendations: Focus on side sleeping position or elevate head with wedge pillow 30 degrees Work on weight loss efforts if able  Do not drive if experiencing excessive daytime sleepiness of fatigue    Orders: Home sleep study re: loud snoring    Follow-up: Please call to schedule follow-up 1-2 weeks after completing home sleep study to review results and treatment if needed (can be virtual)  

## 2023-02-18 NOTE — H&P (Signed)
History and Physical    Patient: Ashley Gross:[id number] DOB: [birth year] DOA: 02/18/2023 DOS: the patient was seen and examined on 02/18/2023 PCP: Dale Tieton, MD  Patient coming from: Home  Chief Complaint:  Chief Complaint  Patient presents with   Shortness of Breath   Cough   HPI: Ashley Gross is a 72 y.o. female with medical history significant of recent pneumonia, asthma, diabetes, essential hypertension, GERD, hyperlipidemia and CVA who presents to the ER with shortness of breath cough and wheezing.  Patient was recently treated with Levaquin for pneumonia and completed it about a week ago.  She is now back with similar symptoms.  Associated with fever and tachycardia.  Patient was seen and evaluated.  Appears to have SIRS.  Chest x-ray shows no acute findings.  Patient however is COVID-negative but flu pending.  Review of Systems: As mentioned in the history of present illness. All other systems reviewed and are negative. Past Medical History:  Diagnosis Date   Abnormal liver function test 10/06/2012   Anemia 01/25/2022   Anemia, unspecified    Asthma    B12 deficiency 02/11/2019   Bleeding internal hemorrhoids 09/13/2017   Choledocholithiasis 06/29/2012   Formatting of this note might be different from the original. Dilated CBD on abdominal imaging 04/2012   Chronic diarrhea 02/16/2013   Colitis    Complication of anesthesia    asthma attack after shoulder surgery   Diabetes mellitus    type II   Esophagitis    GERD (gastroesophageal reflux disease)    Heart murmur    for years- nothing to be concerned about.   History of kidney stones    Hx of arteriovenous malformation (AVM)    Hypertension    IBS (irritable bowel syndrome)    Loss of weight    Pernicious anemia    Personal history of colonic polyps 02/10/2013   02/08/13 colonoscopy - question of rectal varices, two 3-26mm polyps in the sigmoid colon, mass at the hepatic flexure, one 48mm polyp at the  hepatic flexure, nodule at the ileocecal valve, congested mucusa in the entire colon, flattened villi mucosa in the terminal ileum.     Pneumonia    Psoriatic arthritis    s/p penicillamine, plaquenil, MTX, sulfasalazine.  s/p Embrel, Humira,.  Iritis.     Pure hypercholesterolemia    Rectal bleeding 07/15/2017   Stroke 05/29/2022   per patient   Past Surgical History:  Procedure Laterality Date   ABDOMINAL HYSTERECTOMY  1981   ovaries left in place   ANKLE SURGERY     right   BUNIONECTOMY     CHOLECYSTECTOMY  1989   COLONOSCOPY WITH PROPOFOL N/A 12/27/2017   Procedure: COLONOSCOPY WITH PROPOFOL;  Surgeon: Toney Reil, MD;  Location: [facility] ENDOSCOPY;  Service: Gastroenterology;  Laterality: N/A;   COLONOSCOPY WITH PROPOFOL N/A 01/04/2019   Procedure: COLONOSCOPY WITH PROPOFOL;  Surgeon: Toney Reil, MD;  Location: [facility];  Service: Endoscopy;  Laterality: N/A;  Diabetic - oral and injectable   COLONOSCOPY WITH PROPOFOL N/A 07/24/2021   Procedure: COLONOSCOPY WITH PROPOFOL;  Surgeon: Toney Reil, MD;  Location: [facility];  Service: Endoscopy;  Laterality: N/A;  Diabetic   ESOPHAGOGASTRODUODENOSCOPY (EGD) WITH PROPOFOL N/A 12/27/2017   Procedure: ESOPHAGOGASTRODUODENOSCOPY (EGD) WITH PROPOFOL;  Surgeon: Toney Reil, MD;  Location: [facility] ENDOSCOPY;  Service: Gastroenterology;  Laterality: N/A;   HAND SURGERY     right   HEEL SPUR SURGERY  IR 3D INDEPENDENT WKST  10/01/2021   IR ANGIO INTRA EXTRACRAN SEL COM CAROTID INNOMINATE UNI L MOD SED  10/01/2021   IR ANGIO INTRA EXTRACRAN SEL INTERNAL CAROTID UNI R MOD SED  10/01/2021   IR ANGIO INTRA EXTRACRAN SEL INTERNAL CAROTID UNI R MOD SED  05/29/2022   IR ANGIO VERTEBRAL SEL VERTEBRAL BILAT MOD SED  10/01/2021   IR ANGIO VERTEBRAL SEL VERTEBRAL UNI R MOD SED  05/29/2022   IR CT HEAD LTD  10/01/2021   IR TRANSCATH/EMBOLIZ  10/01/2021   IR US GUIDE VASC ACCESS RIGHT  10/01/2021   IR US  GUIDE VASC ACCESS RIGHT  05/29/2022   NOSE SURGERY     x2   POLYPECTOMY  01/04/2019   Procedure: POLYPECTOMY;  Surgeon: Toney Reil, MD;  Location: [facility];  Service: Endoscopy;;   POLYPECTOMY N/A 07/24/2021   Procedure: POLYPECTOMY;  Surgeon: Toney Reil, MD;  Location: [facility];  Service: Endoscopy;  Laterality: N/A;   RADIOLOGY WITH ANESTHESIA N/A 10/01/2021   Procedure: Alfredia Client WITH ANESTHESIA;  Surgeon: Baldemar Lenis, MD;  Location: [facility];  Service: Radiology;  Laterality: N/A;   SHOULDER ARTHROSCOPY WITH OPEN ROTATOR CUFF REPAIR Left 09/20/2018   Procedure: SHOULDER ARTHROSCOPY WITH OPEN ROTATOR CUFF REPAIR;  Surgeon: Christena Flake, MD;  Location: [facility] ORS;  Service: Orthopedics;  Laterality: Left;   SHOULDER CLOSED REDUCTION Left 12/21/2018   Procedure: MANIPULATION UNDER ANESTHESIA WITH STEROID INJECTION;  Surgeon: Christena Flake, MD;  Location: [facility]ERY [facility];  Service: Orthopedics;  Laterality: Left;  Diabetic - insulin and oral meds   UMBILICAL HERNIA REPAIR     XI ROBOTIC ASSISTED INGUINAL HERNIA REPAIR WITH MESH Left 12/16/2021   Procedure: XI ROBOTIC ASSISTED INGUINAL HERNIA REPAIR WITH MESH;  Surgeon: [name], MD;  Location: [facility] ORS;  Service: General;  Laterality: Left;   Social History:  reports that she has never smoked. She has never used smokeless tobacco. She reports that she does not drink alcohol and does not use drugs.  Allergies  Allergen Reactions   Aspirin Other (See Comments)    Increased bleeding   Beta Adrenergic Blockers     3rd degree blockage    Lisinopril Cough   Mtx Support [Cobalamine Combinations] Other (See Comments)    Respiratory symptoms   Nsaids    Vasotec [Enalapril] Cough   Verapamil Other (See Comments)    Heart block   Voltaren [Diclofenac Sodium]     Pt unsure of reaction    Erythromycin Other (See Comments)    Gi intolerance   Indocin [Indomethacin]     Upset stomach    Jardiance [Empagliflozin] Other (See Comments)    Recurrent yeast infections, even with washout and rechallenge    Family History  Problem Relation Age of Onset   Diabetes Other    Hypertension Other    Breast cancer Neg Hx    Colon cancer Neg Hx     Prior to Admission medications   Medication Sig Start Date End Date Taking? Authorizing Provider  acetaminophen (TYLENOL) 500 MG tablet Take 2 tablets (1,000 mg total) by mouth every 6 (six) hours as needed for mild pain. 12/16/21  Yes Piscoya, Elita Quick, MD  albuterol (VENTOLIN HFA) 108 (90 Base) MCG/ACT inhaler Inhale 2 puffs into the lungs every 6 (six) hours as needed for wheezing or shortness of breath.   Yes [provider]  amLODipine (NORVASC) 5 MG tablet TAKE 1 TABLET(5 MG) BY MOUTH  TWICE DAILY 10/01/22  Yes Dale [place], MD  budesonide-formoterol [facility]) 160-4.5 MCG/ACT inhaler Inhale 2 puffs into the lungs 2 (two) times daily. Patient taking differently: Inhale 2 puffs into the lungs 2 (two) times daily as needed (shortness of breath). 11/05/21  Yes Dale [place], MD  Cholecalciferol (VITAMIN D) 50 MCG (2000 UT) tablet Take 2,000 Units by mouth daily.   Yes [provider]  CREON 36000-114000 units CPEP capsule Take 1-2 capsules (36,000-72,000 Units total) by mouth See admin instructions. Take 720000 units with each meal and 84696 units with snacks 07/15/22  Yes Vanga, Loel Dubonnet, MD  diphenhydramine-acetaminophen (TYLENOL PM) 25-500 MG TABS tablet Take 2 tablets by mouth at bedtime as needed (sleep).   Yes [provider]  fluticasone (FLOVENT HFA) 110 MCG/ACT inhaler Inhale 2 puffs into the lungs 2 (two) times daily as needed (shortness of breath).   Yes [provider]  gabapentin (NEURONTIN) 600 MG tablet Take 600 mg by mouth daily. 01/25/23  Yes [provider]  insulin degludec (TRESIBA FLEXTOUCH) 100 UNIT/ML FlexTouch Pen Inject 16 Units into the skin daily. 10/16/22  Yes Dale Drexel, MD  loperamide (IMODIUM) 2 MG capsule Take 4 mg by mouth daily.   Yes [provider]  losartan (COZAAR) 100 MG tablet TAKE 1 TABLET(100 MG) BY MOUTH AT BEDTIME 10/16/22  Yes Dale Roxie, MD  Melatonin 10 MG TABS Take 10 mg by mouth at bedtime.   Yes [provider]  omeprazole (PRILOSEC) 20 MG capsule TAKE 1 CAPSULE(20 MG) BY MOUTH TWICE DAILY 07/05/22  Yes Worthy Rancher B, FNP  rosuvastatin (CRESTOR) 10 MG tablet TAKE 1 TABLET(10 MG) BY MOUTH DAILY 03/26/22  Yes Dale [place], MD  levofloxacin (LEVAQUIN) 500 MG tablet Take 500 mg by mouth daily. Patient not taking: Reported on 02/18/2023 02/07/23   [provider]  nystatin cream (MYCOSTATIN) Apply 1 Application topically 2 (two) times daily. Patient not taking: Reported on 02/18/2023 12/21/22   Dale [place], MD  ONE TOUCH ULTRA TEST test strip PATIENT NEEDS NEW METER STRIPS AND LANCETS FOR ONE TOUCH. HER INSURANCE NO LONGER COVERS [employer]HE[employer]. 07/31/15   Dale Hooker, MD    Physical Exam: Vitals:   02/18/23 2047 02/18/23 2049  BP: (!) 156/73   Pulse: 92   Resp: 20   Temp: (!) 101.9 F (38.8 C)   TempSrc: Oral   SpO2: 95% 97%  Weight: 61.7 kg   Height:  (1.626 m)    Constitutional: NAD, calm, comfortable Eyes: PERRL, lids and conjunctivae normal ENMT: Mucous membranes are moist. Posterior pharynx clear of any exudate or lesions.Normal dentition.  Neck: normal, supple, no masses, no thyromegaly Respiratory: Coarse breath sounds with mild expiratory wheezing, no crackles. Normal respiratory effort. No accessory muscle use.  Cardiovascular: Sinus tachycardia, no murmurs / rubs / gallops. No extremity edema. 2+ pedal pulses. No carotid bruits.  Abdomen: no tenderness, no masses palpated. No hepatosplenomegaly. Bowel sounds positive.  Musculoskeletal: Good range of motion, no joint swelling or tenderness, Skin: no rashes, lesions, ulcers. No induration Neurologic: CN 2-12 grossly intact.  Sensation intact, DTR normal. Strength 5/5 in all 4.  Psychiatric: Normal judgment and insight. Alert and oriented x 3. Normal mood  Data Reviewed:  Temperature 101.9 blood pressure 150/73, white count 13.3.  COVID-19 is negative.  Lactic acid currently pending.  Assessment and Plan:  #1 sepsis of unknown cause: Patient will be admitted and be treated for sepsis of unknown cause.  Workup is not completed in  the ER.  I will check urinalysis.  Also check influenza and other viruses.  Has CT chest will probably provide another clue.  Continue empiric antibiotics and follow culture results.  #2 type 2 diabetes: Initiate sliding scale insulin.  #3 GERD: Continue with PPIs.  #4 essential hypertension: Continue home regimen  #5 hyperlipidemia: Continue with statin.    Advance Care Planning:   Code Status: Full Code   Consults: None  Family Communication: No family at bedside  Severity of Illness: The appropriate patient status for this patient is INPATIENT. Inpatient status is judged to be reasonable and necessary in order to provide the required intensity of service to ensure the patient's safety. The patient's presenting symptoms, physical exam findings, and initial radiographic and laboratory data in the context of their chronic comorbidities is felt to place them at high risk for further clinical deterioration. Furthermore, it is not anticipated that the patient will be medically stable for discharge from the hospital within 2 midnights of admission.   * I certify that at the point of admission it is my clinical judgment that the patient will require inpatient hospital care spanning beyond 2 midnights from the point of admission due to high intensity of service, high risk for further deterioration and high frequency of surveillance required.*  AuthorLonia Blood, MD 02/18/2023 11:43 PM  For on call review [email or web address].

## 2023-02-18 NOTE — Progress Notes (Signed)
CODE SEPSIS - PHARMACY COMMUNICATION  **Broad Spectrum Antibiotics should be administered within 1 hour of Sepsis diagnosis**  Time Code Sepsis Called/Page Received: 2333  Antibiotics Ordered: Levaquin  Time of 1st antibiotic administration: 0028  Otelia Sergeant, PharmD, [facility] 02/18/2023 11:41 PM

## 2023-02-18 NOTE — ED Provider Notes (Signed)
[facility] Provider Note    Event Date/Time   First MD Initiated Contact with Patient 02/18/23 2302     (approximate)   History   Shortness of Breath and Cough   HPI  Ashley Gross is a 72 y.o. female with history of asthma, hypertension, type 2 diabetes, hyperlipidemia, stroke who presents to the emergency department with complaints of shortness of breath, cough and fever.  Symptoms started yesterday.  Recently diagnosed with pneumonia 2 weeks ago and completed a 7 day course of Levaquin as an outpatient.  States last night she woke up from sleep gasping for breath which slowly improved after she did a nebulizer treatment at home but her oxygen saturation was initially 79% when her daughter checked it.  Patient states that she did not come to the emergency department last night because she was being stubborn.  Oxygen level is better now but still feeling short of breath and intermittently wheezing.  Reports she has a nonproductive cough.   History provided by patient.    Past Medical History:  Diagnosis Date   Abnormal liver function test 10/06/2012   Anemia 01/25/2022   Anemia, unspecified    Asthma    B12 deficiency 02/11/2019   Bleeding internal hemorrhoids 09/13/2017   Choledocholithiasis 06/29/2012   Formatting of this note might be different from the original. Dilated CBD on abdominal imaging 04/2012   Chronic diarrhea 02/16/2013   Colitis    Complication of anesthesia    asthma attack after shoulder surgery   Diabetes mellitus    type II   Esophagitis    GERD (gastroesophageal reflux disease)    Heart murmur    for years- nothing to be concerned about.   History of kidney Gross    Hx of arteriovenous malformation (AVM)    Hypertension    IBS (irritable bowel syndrome)    Loss of weight    Pernicious anemia    Personal history of colonic polyps 02/10/2013   02/08/13 colonoscopy - question of rectal varices, two 3-43mm polyps in the  sigmoid colon, mass at the hepatic flexure, one 5mm polyp at the hepatic flexure, nodule at the ileocecal valve, congested mucusa in the entire colon, flattened villi mucosa in the terminal ileum.     Pneumonia    Psoriatic arthritis    s/p penicillamine, plaquenil, MTX, sulfasalazine.  s/p Embrel, Humira,.  Iritis.     Pure hypercholesterolemia    Rectal bleeding 07/15/2017   Stroke 05/29/2022   per patient    Past Surgical History:  Procedure Laterality Date   ABDOMINAL HYSTERECTOMY  1981   ovaries left in place   ANKLE SURGERY     right   BUNIONECTOMY     CHOLECYSTECTOMY  1989   COLONOSCOPY WITH PROPOFOL N/A 12/27/2017   Procedure: COLONOSCOPY WITH PROPOFOL;  Surgeon: Toney Reil, MD;  Location: [facility] ENDOSCOPY;  Service: Gastroenterology;  Laterality: N/A;   COLONOSCOPY WITH PROPOFOL N/A 01/04/2019   Procedure: COLONOSCOPY WITH PROPOFOL;  Surgeon: Toney Reil, MD;  Location: [facility];  Service: Endoscopy;  Laterality: N/A;  Diabetic - oral and injectable   COLONOSCOPY WITH PROPOFOL N/A 07/24/2021   Procedure: COLONOSCOPY WITH PROPOFOL;  Surgeon: Toney Reil, MD;  Location: [facility];  Service: Endoscopy;  Laterality: N/A;  Diabetic   ESOPHAGOGASTRODUODENOSCOPY (EGD) WITH PROPOFOL N/A 12/27/2017   Procedure: ESOPHAGOGASTRODUODENOSCOPY (EGD) WITH PROPOFOL;  Surgeon: Toney Reil, MD;  Location: [facility] ENDOSCOPY;  Service: Gastroenterology;  Laterality: N/A;   HAND SURGERY     right   HEEL SPUR SURGERY     IR 3D INDEPENDENT WKST  10/01/2021   IR ANGIO INTRA EXTRACRAN SEL COM CAROTID INNOMINATE UNI L MOD SED  10/01/2021   IR ANGIO INTRA EXTRACRAN SEL INTERNAL CAROTID UNI R MOD SED  10/01/2021   IR ANGIO INTRA EXTRACRAN SEL INTERNAL CAROTID UNI R MOD SED  05/29/2022   IR ANGIO VERTEBRAL SEL VERTEBRAL BILAT MOD SED  10/01/2021   IR ANGIO VERTEBRAL SEL VERTEBRAL UNI R MOD SED  05/29/2022   IR CT HEAD LTD  10/01/2021   IR TRANSCATH/EMBOLIZ   10/01/2021   IR US GUIDE VASC ACCESS RIGHT  10/01/2021   IR US GUIDE VASC ACCESS RIGHT  05/29/2022   NOSE SURGERY     x2   POLYPECTOMY  01/04/2019   Procedure: POLYPECTOMY;  Surgeon: Toney Reil, MD;  Location: [facility];  Service: Endoscopy;;   POLYPECTOMY N/A 07/24/2021   Procedure: POLYPECTOMY;  Surgeon: Toney Reil, MD;  Location: [facility];  Service: Endoscopy;  Laterality: N/A;   RADIOLOGY WITH ANESTHESIA N/A 10/01/2021   Procedure: Alfredia Client WITH ANESTHESIA;  Surgeon: Baldemar Lenis, MD;  Location: [facility] OR;  Service: Radiology;  Laterality: N/A;   SHOULDER ARTHROSCOPY WITH OPEN ROTATOR CUFF REPAIR Left 09/20/2018   Procedure: SHOULDER ARTHROSCOPY WITH OPEN ROTATOR CUFF REPAIR;  Surgeon: Christena Flake, MD;  Location: [facility] ORS;  Service: Orthopedics;  Laterality: Left;   SHOULDER CLOSED REDUCTION Left 12/21/2018   Procedure: MANIPULATION UNDER ANESTHESIA WITH STEROID INJECTION;  Surgeon: Christena Flake, MD;  Location: [facility];  Service: Orthopedics;  Laterality: Left;  Diabetic - insulin and oral meds   UMBILICAL HERNIA REPAIR     XI ROBOTIC ASSISTED INGUINAL HERNIA REPAIR WITH MESH Left 12/16/2021   Procedure: XI ROBOTIC ASSISTED INGUINAL HERNIA REPAIR WITH MESH;  Surgeon: [name], MD;  Location: [facility] ORS;  Service: General;  Laterality: Left;    MEDICATIONS:  Prior to Admission medications   Medication Sig Start Date End Date Taking? Authorizing Provider  acetaminophen (TYLENOL) 500 MG tablet Take 2 tablets (1,000 mg total) by mouth every 6 (six) hours as needed for mild pain. 12/16/21   Piscoya, Elita Quick, MD  albuterol (VENTOLIN HFA) 108 (90 Base) MCG/ACT inhaler Inhale 2 puffs into the lungs every 6 (six) hours as needed for wheezing or shortness of breath.    [provider]  amLODipine (NORVASC) 5 MG tablet TAKE 1 TABLET(5 MG) BY MOUTH TWICE DAILY 10/01/22   Dale [place], MD  budesonide-formoterol [facility])  160-4.5 MCG/ACT inhaler Inhale 2 puffs into the lungs 2 (two) times daily. Patient taking differently: Inhale 2 puffs into the lungs 2 (two) times daily as needed (shortness of breath). 11/05/21   Dale [place], MD  Cholecalciferol (VITAMIN D) 50 MCG (2000 UT) tablet Take 2,000 Units by mouth daily.    [provider]  CREON 36000-114000 units CPEP capsule Take 1-2 capsules (36,000-72,000 Units total) by mouth See admin instructions. Take 720000 units with each meal and 67737 units with snacks 07/15/22   Vanga, Loel Dubonnet, MD  diphenhydramine-acetaminophen (TYLENOL PM) 25-500 MG TABS tablet Take 2 tablets by mouth at bedtime as needed (sleep).    [provider]  fluticasone (FLOVENT HFA) 110 MCG/ACT inhaler Inhale 2 puffs into the lungs 2 (two) times daily as needed (shortness of breath).    [provider]  gabapentin (NEURONTIN) 600 MG tablet Take  600 mg by mouth daily. 01/25/23   [provider]  insulin degludec (TRESIBA FLEXTOUCH) 100 UNIT/ML FlexTouch Pen Inject 16 Units into the skin daily. 10/16/22   Dale S[place]y, MD  loperamide (IMODIUM) 2 MG capsule Take 4 mg by mouth daily.    [provider]  losartan (COZAAR) 100 MG tablet TAKE 1 TABLET(100 MG) BY MOUTH AT BEDTIME 10/16/22   Dale [place], MD  Melatonin 10 MG TABS Take 10 mg by mouth at bedtime.    [provider]  nystatin cream (MYCOSTATIN) Apply 1 Application topically 2 (two) times daily. 12/21/22   Dale [place], MD  omeprazole (PRILOSEC) 20 MG capsule TAKE 1 CAPSULE(20 MG) BY MOUTH TWICE DAILY 07/05/22   Worthy Rancher B, FNP  ONE TOUCH ULTRA TEST test strip PATIENT NEEDS NEW METER STRIPS AND LANCETS FOR ONE TOUCH. HER INSURANCE NO LONGER COVERS ACCUCHEK. 07/31/15   Dale [place], MD  rosuvastatin (CRESTOR) 10 MG tablet TAKE 1 TABLET(10 MG) BY MOUTH DAILY 03/26/22   Dale [place], MD    Physical Exam   Triage Vital Signs: ED Triage Vitals [02/18/23 2047]  Enc Vitals  Group     BP (!) 156/73     Pulse Rate 92     Resp 20     Temp (!) 101.9 F (38.8 C)     Temp Source Oral     SpO2 95 %     Weight 136 lb (61.7 kg)     Height  (1.626 m)     Head Circumference      Peak Flow      Pain Score 0     Pain Loc      Pain Edu?      Excl. in GC?     Most recent vital signs: Vitals:   02/18/23 2047 02/18/23 2049  BP: (!) 156/73   Pulse: 92   Resp: 20   Temp: (!) 101.9 F (38.8 C)   SpO2: 95% 97%    CONSTITUTIONAL: Alert, responds appropriately to questions.  Elderly, nontoxic HEAD: Normocephalic, atraumatic EYES: Conjunctivae clear, pupils appear equal, sclera nonicteric ENT: normal nose; moist mucous membranes NECK: Supple, normal ROM CARD: RRR; S1 and S2 appreciated RESP: Patient has dry nonproductive cough.  Intermittent expiratory wheezing on exam.  No rhonchi or rales.  Speaking full sentences.  Sats 95% on room air. ABD/GI: Non-distended; soft, non-tender, no rebound, no guarding, no peritoneal signs BACK: The back appears normal EXT: Normal ROM in all joints; no deformity noted, no edema SKIN: Normal color for age and race; warm; no rash on exposed skin NEURO: Moves all extremities equally, normal speech PSYCH: The patient's mood and manner are appropriate.   ED Results / Procedures / Treatments   LABS: (all labs ordered are listed, but only abnormal results are displayed) Labs Reviewed  CBC WITH DIFFERENTIAL/PLATELET - Abnormal; Notable for the following components:      Result Value   WBC 13.3 (*)    RBC 3.75 (*)    Hemoglobin 10.6 (*)    HCT 32.8 (*)    Neutro Abs 10.7 (*)    All other components within normal limits  BASIC METABOLIC PANEL - Abnormal; Notable for the following components:   Glucose, Bld 133 (*)    Calcium 8.0 (*)    All other components within normal limits  SARS CORONAVIRUS 2 BY RT PCR  CULTURE, BLOOD (ROUTINE X 2)  CULTURE, BLOOD (ROUTINE X 2)  RESP PANEL BY RT-PCR (RSV, FLU  A&B, COVID)  [id number]   LACTIC ACID, PLASMA  LACTIC ACID, PLASMA  URINALYSIS, W/ REFLEX TO CULTURE (INFECTION SUSPECTED)  TROPONIN I (HIGH SENSITIVITY)  TROPONIN I (HIGH SENSITIVITY)     EKG:  EKG Interpretation  Date/Time:  Thursday February 18 2023 20:55:08 EDT Ventricular Rate:  82 PR Interval:  176 QRS Duration: 72 QT Interval:  328 QTC Calculation: 383 R Axis:   25 Text Interpretation: Normal sinus rhythm ST & T wave abnormality, consider inferior ischemia Abnormal ECG When compared with ECG of 30-May-2022 12:47, PREVIOUS ECG IS PRESENT Confirmed by Rochele Raring [phone]) on 02/18/2023 11:05:45 PM         RADIOLOGY: My personal review and interpretation of imaging: Chest x-ray clear.  I have personally reviewed all radiology reports.   DG Chest 2 View  Result Date: 02/18/2023 CLINICAL DATA:  Cough EXAM: CHEST - 2 VIEW COMPARISON:  None Available. FINDINGS: Lungs are clear.  No pleural effusion or pneumothorax. The heart is normal in size. Visualized osseous structures are within normal limits. IMPRESSION: Normal chest radiographs. Electronically Signed   By: Charline Bills M.D.   On: 02/18/2023 21:15     PROCEDURES:  Critical Care performed: Yes, see critical care procedure note(s)   CRITICAL CARE Performed by: Baxter Hire Harveen Flesch   Total critical care time: 35 minutes  Critical care time was exclusive of separately billable procedures and treating other patients.  Critical care was necessary to treat or prevent imminent or life-threatening deterioration.  Critical care was time spent personally by me on the following activities: development of treatment plan with patient and/or surrogate as well as nursing, discussions with consultants, evaluation of patient's response to treatment, examination of patient, obtaining history from patient or surrogate, ordering and performing treatments and interventions, ordering and review of laboratory studies, ordering and review of radiographic studies,  pulse oximetry and re-evaluation of patient's condition.   Marland Kitchen1-3 Lead EKG Interpretation  Performed by: Dimitry Holsworth, Layla Maw, DO Authorized by: Lezlee Gills, Layla Maw, DO     Interpretation: normal     ECG rate:  92   ECG rate assessment: normal     Rhythm: sinus rhythm     Ectopy: none     Conduction: normal       IMPRESSION / MDM / ASSESSMENT AND PLAN / ED COURSE  I reviewed the triage vital signs and the nursing notes.    Patient here with complaints of fevers, cough and hypoxia at home.  The patient is on the cardiac monitor to evaluate for evidence of arrhythmia and/or significant heart rate changes.   DIFFERENTIAL DIAGNOSIS (includes but not limited to):   Pneumonia, viral URI, PE, less likely CHF, pneumothorax   Patient's presentation is most consistent with acute presentation with potential threat to life or bodily function.   PLAN: Workup initiated from triage.  Labs show leukocytosis of 13,000 with left shift.  Negative troponin.  Negative COVID.  Chest x-ray reviewed and interpreted by myself and the radiologist shows no acute abnormality.  I still clinically think that patient has pneumonia whether it is viral versus bacterial.  Will give breathing treatments, Solu-Medrol given she is intermittently wheezing here and start her on IV antibiotics for community-acquired pneumonia.  She has an allergy listed to erythromycin therefore will avoid ceftriaxone/azithromycin, and give Levaquin.  Will discuss with hospitalist for admission given she meets SIRS criteria and had an oxygen saturation at home of 79%.   MEDICATIONS GIVEN IN ED: Medications  lactated ringers infusion (has  no administration in time range)  lactated ringers bolus 1,000 mL (has no administration in time range)  levofloxacin (LEVAQUIN) IVPB 750 mg (has no administration in time range)  ipratropium-albuterol (DUONEB) 0.5-2.5 (3) MG/3ML nebulizer solution 3 mL (has no administration in time range)   methylPREDNISolone sodium succinate (SOLU-MEDROL) 125 mg/2 mL injection 125 mg (has no administration in time range)     ED COURSE: Discussed with Dr. Mikeal Hawthorne who requests a flu swab be sent for admission.   CONSULTS:  Consulted and discussed patient's case with hospitalist, Dr. Mikeal Hawthorne.  I have recommended admission and consulting physician agrees and will place admission orders.  Patient (and family if present) agree with this plan.   I reviewed all nursing notes, vitals, pertinent previous records.  All labs, EKGs, imaging ordered have been independently reviewed and interpreted by myself.    OUTSIDE RECORDS REVIEWED: Reviewed PCP note on 02/07/2023.       FINAL CLINICAL IMPRESSION(S) / ED DIAGNOSES   Final diagnoses:  Shortness of breath  Fever, unspecified fever cause  SIRS (systemic inflammatory response syndrome)  Acute respiratory failure with hypoxia     Rx / DC Orders   ED Discharge Orders     None        Note:  This document was prepared using Dragon voice recognition software and may include unintentional dictation errors.   Kaydenn Mclear, Layla Maw, DO 02/18/23 2341

## 2023-02-18 NOTE — Progress Notes (Signed)
 This encounter was created in error - please disregard.

## 2023-02-18 NOTE — ED Triage Notes (Addendum)
Pt states she was dx 2 weeks ago with pneumonia, pt finished round of abx. Pt states last night she developed worsening cough and shortness of breath. Pt took tylenol PTA for fever, pt states she cannot take ibuprofen.

## 2023-02-19 ENCOUNTER — Encounter: Payer: PPO | Admitting: Primary Care

## 2023-02-19 ENCOUNTER — Other Ambulatory Visit: Payer: Self-pay

## 2023-02-19 ENCOUNTER — Encounter: Payer: Self-pay | Admitting: Internal Medicine

## 2023-02-19 DIAGNOSIS — R0602 Shortness of breath: Secondary | ICD-10-CM

## 2023-02-19 LAB — CBC
HCT: 31.6 % — ABNORMAL LOW (ref 36.0–46.0)
Hemoglobin: 10 g/dL — ABNORMAL LOW (ref 12.0–15.0)
MCH: 27.8 pg (ref 26.0–34.0)
MCHC: 31.6 g/dL (ref 30.0–36.0)
MCV: 87.8 fL (ref 80.0–100.0)
Platelets: 255 10*3/uL (ref 150–400)
RBC: 3.6 MIL/uL — ABNORMAL LOW (ref 3.87–5.11)
RDW: 14.2 % (ref 11.5–15.5)
WBC: 9.4 10*3/uL (ref 4.0–10.5)
nRBC: 0 % (ref 0.0–0.2)

## 2023-02-19 LAB — CULTURE, BLOOD (ROUTINE X 2)

## 2023-02-19 LAB — COMPREHENSIVE METABOLIC PANEL
ALT: 13 U/L (ref 0–44)
AST: 27 U/L (ref 15–41)
Albumin: 2.6 g/dL — ABNORMAL LOW (ref 3.5–5.0)
Alkaline Phosphatase: 119 U/L (ref 38–126)
Anion gap: 8 (ref 5–15)
BUN: 11 mg/dL (ref 8–23)
CO2: 23 mmol/L (ref 22–32)
Calcium: 8 mg/dL — ABNORMAL LOW (ref 8.9–10.3)
Chloride: 109 mmol/L (ref 98–111)
Creatinine, Ser: 0.81 mg/dL (ref 0.44–1.00)
GFR, Estimated: >60 mL/min (ref >60)
Glucose, Bld: 300 mg/dL — ABNORMAL HIGH (ref 70–99)
Potassium: 3.2 mmol/L — ABNORMAL LOW (ref 3.5–5.1)
Sodium: 140 mmol/L (ref 135–145)
Total Bilirubin: 0.7 mg/dL (ref 0.3–1.2)
Total Protein: 5.4 g/dL — ABNORMAL LOW (ref 6.5–8.1)

## 2023-02-19 LAB — URINALYSIS, W/ REFLEX TO CULTURE (INFECTION SUSPECTED)
Bacteria, UA: NONE SEEN
Bilirubin Urine: NEGATIVE
Glucose, UA: NEGATIVE mg/dL
Hgb urine dipstick: NEGATIVE
Ketones, ur: NEGATIVE mg/dL
Nitrite: NEGATIVE
Protein, ur: NEGATIVE mg/dL
Specific Gravity, Urine: 1.005 (ref 1.005–1.030)
pH: 6 (ref 5.0–8.0)

## 2023-02-19 LAB — RESPIRATORY PANEL BY PCR

## 2023-02-19 LAB — RESP PANEL BY RT-PCR (RSV, FLU A&B, COVID)  RVPGX2
Influenza A by PCR: NEGATIVE
Influenza B by PCR: NEGATIVE
Resp Syncytial Virus by PCR: NEGATIVE
SARS Coronavirus 2 by RT PCR: NEGATIVE

## 2023-02-19 LAB — PROTIME-INR
INR: 1.1 (ref 0.8–1.2)
Prothrombin Time: 14 seconds (ref 11.4–15.2)

## 2023-02-19 LAB — GLUCOSE, CAPILLARY
Glucose-Capillary: 223 mg/dL — ABNORMAL HIGH (ref 70–99)
Glucose-Capillary: 237 mg/dL — ABNORMAL HIGH (ref 70–99)
Glucose-Capillary: 72 mg/dL (ref 70–99)
Glucose-Capillary: 142 mg/dL — ABNORMAL HIGH (ref 70–99)

## 2023-02-19 LAB — LACTIC ACID, PLASMA: Lactic Acid, Venous: 1.4 mmol/L (ref 0.5–1.9)

## 2023-02-19 LAB — MRSA NEXT GEN BY PCR, NASAL: MRSA by PCR Next Gen: NOT DETECTED

## 2023-02-19 LAB — TROPONIN I (HIGH SENSITIVITY): Troponin I (High Sensitivity): 15 ng/L (ref <18)

## 2023-02-19 LAB — CORTISOL-AM, BLOOD: Cortisol - AM: 5.6 ug/dL — ABNORMAL LOW (ref 6.7–22.6)

## 2023-02-19 LAB — PROCALCITONIN: Procalcitonin: <0.1 ng/mL

## 2023-02-19 MED ORDER — INSULIN ASPART 100 UNIT/ML IJ SOLN
0.0000 [IU] | Freq: Every day | INTRAMUSCULAR | Status: DC
  Administered 2023-02-20: 3 [IU] via SUBCUTANEOUS
  Filled 2023-02-19: qty 1

## 2023-02-19 MED ORDER — PANCRELIPASE (LIP-PROT-AMYL) 12000-38000 UNITS PO CPEP: 36000.0000 [IU] | ORAL_CAPSULE | ORAL | Status: DC | PRN

## 2023-02-19 MED ORDER — LOSARTAN POTASSIUM 50 MG PO TABS
100.0000 mg | ORAL_TABLET | Freq: Every day | ORAL | Status: DC
  Administered 2023-02-19 – 2023-02-20 (×2): 100 mg via ORAL
  Filled 2023-02-19 (×2): qty 2

## 2023-02-19 MED ORDER — ALBUTEROL SULFATE HFA 108 (90 BASE) MCG/ACT IN AERS: 2.0000 | INHALATION_SPRAY | Freq: Four times a day (QID) | RESPIRATORY_TRACT | Status: DC | PRN

## 2023-02-19 MED ORDER — GUAIFENESIN ER 600 MG PO TB12
600.0000 mg | ORAL_TABLET | Freq: Two times a day (BID) | ORAL | Status: DC
  Administered 2023-02-19 – 2023-02-21 (×5): 600 mg via ORAL
  Filled 2023-02-19 (×5): qty 1

## 2023-02-19 MED ORDER — POTASSIUM CHLORIDE CRYS ER 20 MEQ PO TBCR
40.0000 meq | EXTENDED_RELEASE_TABLET | Freq: Once | ORAL | Status: AC
  Administered 2023-02-19: 40 meq via ORAL
  Filled 2023-02-19: qty 2

## 2023-02-19 MED ORDER — BUDESONIDE 0.5 MG/2ML IN SUSP: 0.5000 mg | Freq: Two times a day (BID) | RESPIRATORY_TRACT | Status: DC | PRN

## 2023-02-19 MED ORDER — NYSTATIN 100000 UNIT/GM EX CREA: 1.0000 | TOPICAL_CREAM | Freq: Two times a day (BID) | CUTANEOUS | Status: DC

## 2023-02-19 MED ORDER — VANCOMYCIN HCL IN DEXTROSE 1-5 GM/200ML-% IV SOLN: 1000.0000 mg | Freq: Once | INTRAVENOUS | Status: DC

## 2023-02-19 MED ORDER — ALBUTEROL SULFATE (2.5 MG/3ML) 0.083% IN NEBU: 2.5000 mg | INHALATION_SOLUTION | Freq: Four times a day (QID) | RESPIRATORY_TRACT | Status: DC | PRN

## 2023-02-19 MED ORDER — SODIUM CHLORIDE 0.9 % IV SOLN
2.0000 g | Freq: Once | INTRAVENOUS | Status: DC
Start: 2023-02-19 — End: 2023-02-19

## 2023-02-19 MED ORDER — LACTATED RINGERS IV SOLN
150.0000 mL/h | INTRAVENOUS | Status: DC
  Administered 2023-02-19: 150 mL/h via INTRAVENOUS

## 2023-02-19 MED ORDER — ONDANSETRON HCL 4 MG/2ML IJ SOLN: 4.0000 mg | Freq: Four times a day (QID) | INTRAMUSCULAR | Status: DC | PRN

## 2023-02-19 MED ORDER — SODIUM CHLORIDE 0.9 % IV SOLN
2.0000 g | Freq: Two times a day (BID) | INTRAVENOUS | Status: DC
  Filled 2023-02-19: qty 12.5

## 2023-02-19 MED ORDER — PHENYLEPHRINE-MINERAL OIL-PET 0.25-14-74.9 % RE OINT
1.0000 | TOPICAL_OINTMENT | Freq: Two times a day (BID) | RECTAL | Status: DC | PRN
  Administered 2023-02-19: 1 via RECTAL
  Filled 2023-02-19: qty 57

## 2023-02-19 MED ORDER — FLUTICASONE PROPIONATE HFA 110 MCG/ACT IN AERO: 2.0000 | INHALATION_SPRAY | Freq: Two times a day (BID) | RESPIRATORY_TRACT | Status: DC | PRN

## 2023-02-19 MED ORDER — DIPHENHYDRAMINE HCL 25 MG PO CAPS: 50.0000 mg | ORAL_CAPSULE | Freq: Every evening | ORAL | Status: DC | PRN

## 2023-02-19 MED ORDER — PANCRELIPASE (LIP-PROT-AMYL) 12000-38000 UNITS PO CPEP: 36000.0000 [IU] | ORAL_CAPSULE | ORAL | Status: DC

## 2023-02-19 MED ORDER — PREDNISONE 20 MG PO TABS
20.0000 mg | ORAL_TABLET | Freq: Every day | ORAL | Status: DC
  Administered 2023-02-19 – 2023-02-20 (×2): 20 mg via ORAL
  Filled 2023-02-19 (×2): qty 1

## 2023-02-19 MED ORDER — AMLODIPINE BESYLATE 5 MG PO TABS
5.0000 mg | ORAL_TABLET | Freq: Every day | ORAL | Status: DC
  Administered 2023-02-19 – 2023-02-21 (×3): 5 mg via ORAL
  Filled 2023-02-19 (×3): qty 1

## 2023-02-19 MED ORDER — IPRATROPIUM-ALBUTEROL 0.5-2.5 (3) MG/3ML IN SOLN: 3.0000 mL | RESPIRATORY_TRACT | Status: DC | PRN

## 2023-02-19 MED ORDER — METRONIDAZOLE 500 MG/100ML IV SOLN
500.0000 mg | Freq: Two times a day (BID) | INTRAVENOUS | Status: DC
  Administered 2023-02-19: 500 mg via INTRAVENOUS
  Filled 2023-02-19: qty 100

## 2023-02-19 MED ORDER — ROSUVASTATIN CALCIUM 10 MG PO TABS
10.0000 mg | ORAL_TABLET | Freq: Every day | ORAL | Status: DC
  Administered 2023-02-19 – 2023-02-21 (×3): 10 mg via ORAL
  Filled 2023-02-19 (×3): qty 1

## 2023-02-19 MED ORDER — VANCOMYCIN HCL IN DEXTROSE 1-5 GM/200ML-% IV SOLN: 1000.0000 mg | INTRAVENOUS | Status: DC

## 2023-02-19 MED ORDER — LACTATED RINGERS IV BOLUS (SEPSIS)
1000.0000 mL | Freq: Once | INTRAVENOUS | Status: AC
  Administered 2023-02-19: 1000 mL via INTRAVENOUS

## 2023-02-19 MED ORDER — VITAMIN D 25 MCG (1000 UNIT) PO TABS
2000.0000 [IU] | ORAL_TABLET | Freq: Every day | ORAL | Status: DC
  Administered 2023-02-19 – 2023-02-21 (×3): 2000 [IU] via ORAL
  Filled 2023-02-19 (×3): qty 2

## 2023-02-19 MED ORDER — ACETAMINOPHEN 500 MG PO TABS: 1000.0000 mg | ORAL_TABLET | Freq: Four times a day (QID) | ORAL | Status: DC | PRN

## 2023-02-19 MED ORDER — GABAPENTIN 300 MG PO CAPS
600.0000 mg | ORAL_CAPSULE | Freq: Every day | ORAL | Status: DC
  Administered 2023-02-19 – 2023-02-20 (×2): 600 mg via ORAL
  Filled 2023-02-19 (×3): qty 2

## 2023-02-19 MED ORDER — MELATONIN 5 MG PO TABS
10.0000 mg | ORAL_TABLET | Freq: Every day | ORAL | Status: DC
  Administered 2023-02-19 – 2023-02-20 (×2): 10 mg via ORAL
  Filled 2023-02-19 (×2): qty 2

## 2023-02-19 MED ORDER — INSULIN ASPART 100 UNIT/ML IJ SOLN
0.0000 [IU] | Freq: Three times a day (TID) | INTRAMUSCULAR | Status: DC
  Administered 2023-02-19 (×2): 5 [IU] via SUBCUTANEOUS
  Administered 2023-02-20: 8 [IU] via SUBCUTANEOUS
  Administered 2023-02-20: 3 [IU] via SUBCUTANEOUS
  Administered 2023-02-21: 8 [IU] via SUBCUTANEOUS
  Administered 2023-02-21: 3 [IU] via SUBCUTANEOUS
  Filled 2023-02-19 (×6): qty 1

## 2023-02-19 MED ORDER — ACETAMINOPHEN 325 MG PO TABS: 650.0000 mg | ORAL_TABLET | Freq: Four times a day (QID) | ORAL | Status: DC | PRN

## 2023-02-19 MED ORDER — INSULIN GLARGINE-YFGN 100 UNIT/ML ~~LOC~~ SOLN
16.0000 [IU] | Freq: Every day | SUBCUTANEOUS | Status: DC
  Administered 2023-02-19 – 2023-02-21 (×3): 16 [IU] via SUBCUTANEOUS
  Filled 2023-02-19 (×3): qty 0.16

## 2023-02-19 MED ORDER — MOMETASONE FURO-FORMOTEROL FUM 200-5 MCG/ACT IN AERO
2.0000 | INHALATION_SPRAY | Freq: Two times a day (BID) | RESPIRATORY_TRACT | Status: DC
  Administered 2023-02-19 – 2023-02-20 (×2): 2 via RESPIRATORY_TRACT
  Filled 2023-02-19: qty 8.8

## 2023-02-19 MED ORDER — PANTOPRAZOLE SODIUM 40 MG PO TBEC
40.0000 mg | DELAYED_RELEASE_TABLET | Freq: Every day | ORAL | Status: DC
  Administered 2023-02-19 – 2023-02-21 (×3): 40 mg via ORAL
  Filled 2023-02-19 (×3): qty 1

## 2023-02-19 MED ORDER — PANCRELIPASE (LIP-PROT-AMYL) 12000-38000 UNITS PO CPEP
72000.0000 [IU] | ORAL_CAPSULE | Freq: Three times a day (TID) | ORAL | Status: DC
  Administered 2023-02-19 – 2023-02-21 (×8): 72000 [IU] via ORAL
  Filled 2023-02-19 (×8): qty 6

## 2023-02-19 MED ORDER — LOPERAMIDE HCL 2 MG PO CAPS
4.0000 mg | ORAL_CAPSULE | Freq: Every day | ORAL | Status: DC
  Administered 2023-02-19 – 2023-02-21 (×3): 4 mg via ORAL
  Filled 2023-02-19 (×3): qty 2

## 2023-02-19 MED ORDER — BENZONATATE 100 MG PO CAPS
200.0000 mg | ORAL_CAPSULE | Freq: Three times a day (TID) | ORAL | Status: DC
  Administered 2023-02-19 – 2023-02-21 (×7): 200 mg via ORAL
  Filled 2023-02-19 (×7): qty 2

## 2023-02-19 MED ORDER — ENOXAPARIN SODIUM 40 MG/0.4ML IJ SOSY
40.0000 mg | PREFILLED_SYRINGE | INTRAMUSCULAR | Status: DC
  Administered 2023-02-19 – 2023-02-21 (×2): 40 mg via SUBCUTANEOUS
  Filled 2023-02-19 (×3): qty 0.4

## 2023-02-19 MED ORDER — ONDANSETRON HCL 4 MG PO TABS: 4.0000 mg | ORAL_TABLET | Freq: Four times a day (QID) | ORAL | Status: DC | PRN

## 2023-02-19 MED ORDER — VANCOMYCIN HCL 1500 MG/300ML IV SOLN
1500.0000 mg | Freq: Once | INTRAVENOUS | Status: AC
  Administered 2023-02-19: 1500 mg via INTRAVENOUS
  Filled 2023-02-19: qty 300

## 2023-02-19 NOTE — Care Management CC44 (Signed)
Condition Code 44 Documentation Completed  Patient Details  Name: Ashley Gross MRN: [id number] Date of Birth: November 21, 1950   Condition Code 44 given:  Yes Patient signature on Condition Code 44 notice:  Yes Documentation of 2 MD's agreement:  Yes Code 44 added to claim:  Yes    Allena Katz, LCSW 02/19/2023, 9:48 AM

## 2023-02-19 NOTE — Progress Notes (Signed)
Pharmacy Antibiotic Note  Ashley Gross is a 72 y.o. female admitted on 02/18/2023 with sepsis.  Pharmacy has been consulted for Cefepime & Vancomycin dosing for 7 days.  Plan: Cefepime 2 gm q12hr per indication & renal fxn.  Pt given Vancomycin 1500 mg once. Vancomycin 1000 mg IV Q 24 hrs. Goal AUC 400-550. Expected AUC: 471 SCr used: 0.84  Pharmacy will continue to follow and will adjust abx dosing whenever warranted.  Temp (24hrs), Avg:101.9 F (38.8 C), Min:101.9 F (38.8 C), Max:101.9 F (38.8 C)   Recent Labs  Lab 02/18/23 2057 02/19/23 0000  WBC 13.3*  --   CREATININE 0.84  --   LATICACIDVEN  --  1.4    Estimated Creatinine Clearance: 52.3 mL/min (by C-G formula based on SCr of 0.84 mg/dL).    Allergies  Allergen Reactions   Aspirin Other (See Comments)    Increased bleeding   Beta Adrenergic Blockers     3rd degree blockage    Lisinopril Cough   Mtx Support [Cobalamine Combinations] Other (See Comments)    Respiratory symptoms   Nsaids    Vasotec [Enalapril] Cough   Verapamil Other (See Comments)    Heart block   Voltaren [Diclofenac Sodium]     Pt unsure of reaction    Erythromycin Other (See Comments)    Gi intolerance   Indocin [Indomethacin]     Upset stomach   Jardiance [Empagliflozin] Other (See Comments)    Recurrent yeast infections, even with washout and rechallenge    Antimicrobials this admission: 4/18 Levaquin >> x 1 dose 4/19 Cefepime >> x 7 days 4/19 Vancomycin >> x 7 days  Microbiology results: 4/19 BCx: Pending  Thank you for allowing pharmacy to be a part of this patient's care.  Otelia Sergeant, PharmD, [facility] 02/19/2023 1:02 AM

## 2023-02-19 NOTE — ED Notes (Signed)
Reached out to RN who will be taking care of the patient [name] RN, ok for patient to come to the floor.

## 2023-02-19 NOTE — ED Notes (Addendum)
Nt unhooked pt from monitor is use restroom.

## 2023-02-19 NOTE — Sepsis Progress Note (Signed)
Elink monitoring for the code sepsis protocol.  

## 2023-02-19 NOTE — Progress Notes (Signed)
PROGRESS NOTE    Ashley Gross  NFA:[id number] DOB: January 31, 1951 DOA: 02/18/2023 PCP: Dale [place], MD    Brief Narrative:  72 year old female history significant for asthma, hypertension, GERD, hyperlipidemia, history of CVA who presents to the ER approximately 2 weeks after resolution of her pneumonia treated with antibiotics complaining of 2 to 3 days of shortness of breath associated with cough and wheezing.  Treated with Levaquin for pneumonia.  Completed about 1 week prior to admission.  Had one-time fever as well as elevated heart rate.  Chest x-ray negative.  COVID-negative.  Flu negative.  RSV negative.  Initially admitted with this presumptive diagnosis of sepsis with unclear source.  Chest x-ray is negative.  Procalcitonin negative.  Sepsis has been ruled out.  All antibiotics have been discontinued.   Assessment & Plan:   Principal Problem:   Sepsis Active Problems:   Hypertension   GERD (gastroesophageal reflux disease)   Hypercholesterolemia   Type 2 diabetes mellitus with hyperglycemia  Shortness of breath associated with cough and wheezing Sepsis ruled out No clear infectious signs.  Chest x-ray negative urinalysis negative.  Procalcitonin negative.  Discontinue all broad-spectrum antibiotics.  Suspect possible asthma exacerbation in the setting of recent pneumonia.  Also unable to exclude respiratory viral syndrome considering fever. Plan: Discontinue antibiotics Check respiratory viral panel P.o. prednisone 20 mg daily Bronchodilators Respiratory isolation for now Vitals per unit protocol  Type 2 diabetes mellitus Basal bolus regimen Carb modified diet  Essential hypertension Blood pressure controlled Home regimen resumed  Hyperlipidemia PTA statin  GERD PPI  History of asthma Possible asthma exacerbation however wheezing resolved at time of my evaluation.  Will initiate as needed bronchodilator regimen and p.o. prednisone 20 mg daily   DVT  prophylaxis: SQ Lovenox Code Status: Full Family Communication: None today Disposition Plan: Status is: Observation The patient remains OBS appropriate and will d/c before 2 midnights.   Level of care: Telemetry Medical  Consultants:  None  Procedures:  None  Antimicrobials: None    Subjective: Seen and examined.  Reports symptomatic treatment since admission  Objective: Vitals:   02/19/23 0115 02/19/23 0153 02/19/23 0503 02/19/23 0836  BP:  139/64 132/61 (!) 152/66  Pulse:  94 96 88  Resp:  19 19 16   Temp: 98.1 F (36.7 C) 98.3 F (36.8 C) 98.7 F (37.1 C) 98.3 F (36.8 C)  TempSrc: Oral Oral    SpO2:  100% 99% 100%  Weight:      Height:        Intake/Output Summary (Last 24 hours) at 02/19/2023 1239 Last data filed at 02/19/2023 0955 Gross per 24 hour  Intake 1240 ml  Output --  Net 1240 ml   Filed Weights   02/18/23 2047  Weight: 61.7 kg    Examination:  General exam: NAD Respiratory system: Mild bibasilar crackles.  Normal work of breathing.  Room air Cardiovascular system: S1-S2, RRR, no murmurs, no pedal edema Gastrointestinal system: Soft, NT/ND, normal bowel sounds Central nervous system: Alert and oriented. No focal neurological deficits. Extremities: Symmetric 5 x 5 power. Skin: No rashes, lesions or ulcers Psychiatry: Judgement and insight appear normal. Mood & affect appropriate.     Data Reviewed: I have personally reviewed following labs and imaging studies  CBC: Recent Labs  Lab 02/18/23 2057 02/19/23 0519  WBC 13.3* 9.4  NEUTROABS 10.7*  --   HGB 10.6* 10.0*  HCT 32.8* 31.6*  MCV 87.5 87.8  PLT 268 255   Basic Metabolic Panel:  Recent Labs  Lab 02/18/23 2057 02/19/23 0519  NA 136 140  K 3.5 3.2*  CL 105 109  CO2 23 23  GLUCOSE 133* 300*  BUN 11 11  CREATININE 0.84 0.81  CALCIUM 8.0* 8.0*   GFR: Estimated Creatinine Clearance: 54.2 mL/min (by C-G formula based on SCr of 0.81 mg/dL). Liver Function Tests: Recent  Labs  Lab 02/19/23 0519  AST 27  ALT 13  ALKPHOS 119  BILITOT 0.7  PROT 5.4*  ALBUMIN 2.6*   No results for input(s): "LIPASE", "AMYLASE" in the last 168 hours. No results for input(s): "AMMONIA" in the last 168 hours. Coagulation Profile: Recent Labs  Lab 02/19/23 0519  INR 1.1   Cardiac Enzymes: No results for input(s): "CKTOTAL", "CKMB", "CKMBINDEX", "TROPONINI" in the last 168 hours. BNP (last 3 results) No results for input(s): "PROBNP" in the last 8760 hours. HbA1C: No results for input(s): "HGBA1C" in the last 72 hours. CBG: Recent Labs  Lab 02/19/23 0840 02/19/23 1141  GLUCAP 223* 237*   Lipid Profile: No results for input(s): "CHOL", "HDL", "LDLCALC", "TRIG", "CHOLHDL", "LDLDIRECT" in the last 72 hours. Thyroid Function Tests: No results for input(s): "TSH", "T4TOTAL", "FREET4", "T3FREE", "THYROIDAB" in the last 72 hours. Anemia Panel: No results for input(s): "VITAMINB12", "FOLATE", "FERRITIN", "TIBC", "IRON", "RETICCTPCT" in the last 72 hours. Sepsis Labs: Recent Labs  Lab 02/19/23 0000 02/19/23 0519  PROCALCITON  --  <0.10  LATICACIDVEN 1.4  --     Recent Results (from the past 240 hour(s))  SARS Coronavirus 2 by RT PCR (hospital order, performed in [facility] hospital lab) *cepheid single result test* Anterior Nasal Swab     Status: None   Collection Time: 02/18/23  8:57 PM   Specimen: Anterior Nasal Swab  Result Value Ref Range Status   SARS Coronavirus 2 by RT PCR NEGATIVE NEGATIVE Final    Comment: (NOTE) SARS-CoV-2 target nucleic acids are NOT DETECTED.  The SARS-CoV-2 RNA is generally detectable in upper and lower respiratory specimens during the acute phase of infection. The lowest concentration of SARS-CoV-2 viral copies this assay can detect is 250 copies / mL. A negative result does not preclude SARS-CoV-2 infection and should not be used as the sole basis for treatment or other patient management decisions.  A negative result may  occur with improper specimen collection / handling, submission of specimen other than nasopharyngeal swab, presence of viral mutation(s) within the areas targeted by this assay, and inadequate number of viral copies (<250 copies / mL). A negative result must be combined with clinical observations, patient history, and epidemiological information.  Fact Sheet for Patients:   RoadLapTop.co.za  Fact Sheet for Healthcare Providers: http://kim-miller.com/  This test is not yet approved or  cleared by the [place] FDA and has been authorized for detection and/or diagnosis of SARS-CoV-2 by FDA under an Emergency Use Authorization (EUA).  This EUA will remain in effect (meaning this test can be used) for the duration of the COVID-19 declaration under Section 564(b)(1) of the Act, 21 U.S.C. section 360bbb-3(b)(1), unless the authorization is terminated or revoked sooner.  Performed at [name], [address] [place], [place] [address]   Blood Culture (routine x 2)     Status: None (Preliminary result)   Collection Time: 02/18/23 11:59 PM   Specimen: BLOOD  Result Value Ref Range Status   Specimen Description BLOOD RIGHT ARM  Final   Special Requests   Final    BOTTLES DRAWN AEROBIC AND ANAEROBIC Blood  Culture adequate volume   Culture   Final    NO GROWTH < 12 HOURS Performed at [facility], [address] [place], [place] [address]    Report Status PENDING  Incomplete  Blood Culture (routine x 2)     Status: None (Preliminary result)   Collection Time: 02/19/23 12:00 AM   Specimen: BLOOD  Result Value Ref Range Status   Specimen Description BLOOD LEFT ARM  Final   Special Requests   Final    BOTTLES DRAWN AEROBIC AND ANAEROBIC Blood Culture results may not be optimal due to an inadequate volume of blood received in culture bottles   Culture   Final    NO GROWTH < 12 HOURS Performed at [facility], [address] [place], [place] [address]    Report Status PENDING  Incomplete  Resp panel by RT-PCR (RSV, Flu A&B, Covid) Urine, Clean Catch     Status: None   Collection Time: 02/19/23 12:00 AM   Specimen: Urine, Clean Catch; Nasal Swab  Result Value Ref Range Status   SARS Coronavirus 2 by RT PCR NEGATIVE NEGATIVE Final    Comment: (NOTE) SARS-CoV-2 target nucleic acids are NOT DETECTED.  The SARS-CoV-2 RNA is generally detectable in upper respiratory specimens during the acute phase of infection. The lowest concentration of SARS-CoV-2 viral copies this assay can detect is 138 copies/mL. A negative result does not preclude SARS-Cov-2 infection and should not be used as the sole basis for treatment or other patient management decisions. A negative result may occur with  improper specimen collection/handling, submission of specimen other than nasopharyngeal swab, presence of viral mutation(s) within the areas targeted by this assay, and inadequate number of viral copies(<138 copies/mL). A negative result must be combined with clinical observations, patient history, and epidemiological information. The expected result is Negative.  Fact Sheet for Patients:  BloggerCourse.com  Fact Sheet for Healthcare Providers:  SeriousBroker.it  This test is no t yet approved or cleared by the [place] FDA and  has been authorized for detection and/or diagnosis of SARS-CoV-2 by FDA under an Emergency Use Authorization (EUA). This EUA will remain  in effect (meaning this test can be used) for the duration of the COVID-19 declaration under Section 564(b)(1) of the Act, 21 U.S.C.section 360bbb-3(b)(1), unless the authorization is terminated  or revoked sooner.       Influenza A by PCR NEGATIVE NEGATIVE Final   Influenza B by PCR NEGATIVE NEGATIVE Final    Comment: (NOTE) The Xpert Xpress SARS-CoV-2/FLU/RSV plus assay is intended as  an aid in the diagnosis of influenza from Nasopharyngeal swab specimens and should not be used as a sole basis for treatment. Nasal washings and aspirates are unacceptable for Xpert Xpress SARS-CoV-2/FLU/RSV testing.  Fact Sheet for Patients: BloggerCourse.com  Fact Sheet for Healthcare Providers: SeriousBroker.it  This test is not yet approved or cleared by the [place] FDA and has been authorized for detection and/or diagnosis of SARS-CoV-2 by FDA under an Emergency Use Authorization (EUA). This EUA will remain in effect (meaning this test can be used) for the duration of the COVID-19 declaration under Section 564(b)(1) of the Act, 21 U.S.C. section 360bbb-3(b)(1), unless the authorization is terminated or revoked.     Resp Syncytial Virus by PCR NEGATIVE NEGATIVE Final    Comment: (NOTE) Fact Sheet for Patients: BloggerCourse.com  Fact Sheet for Healthcare Providers: SeriousBroker.it  This test is not yet approved or cleared by the [place] FDA and has been  authorized for detection and/or diagnosis of SARS-CoV-2 by FDA under an Emergency Use Authorization (EUA). This EUA will remain in effect (meaning this test can be used) for the duration of the COVID-19 declaration under Section 564(b)(1) of the Act, 21 U.S.C. section 360bbb-3(b)(1), unless the authorization is terminated or revoked.  Performed at [facility], [address] [place], [place] [address]          Radiology Studies: DG Chest 2 View  Result Date: 02/18/2023 CLINICAL DATA:  Cough EXAM: CHEST - 2 VIEW COMPARISON:  None Available. FINDINGS: Lungs are clear.  No pleural effusion or pneumothorax. The heart is normal in size. Visualized osseous structures are within normal limits. IMPRESSION: Normal chest radiographs. Electronically Signed   By: Charline Bills M.D.   On: 02/18/2023  21:15        Scheduled Meds:  amLODipine  5 mg Oral Daily   benzonatate  200 mg Oral TID   cholecalciferol  2,000 Units Oral Daily   enoxaparin (LOVENOX) injection  40 mg Subcutaneous Q24H   gabapentin  600 mg Oral Daily   guaiFENesin  600 mg Oral BID   insulin aspart  0-15 Units Subcutaneous TID WC   insulin aspart  0-5 Units Subcutaneous QHS   insulin glargine-yfgn  16 Units Subcutaneous Daily   lipase/protease/amylase  72,000 Units Oral TID WC   loperamide  4 mg Oral Daily   losartan  100 mg Oral QHS   melatonin  10 mg Oral QHS   mometasone-formoterol  2 puff Inhalation BID   pantoprazole  40 mg Oral Daily   predniSONE  20 mg Oral Q breakfast   rosuvastatin  10 mg Oral Daily   Continuous Infusions:   LOS: 1 day    Tresa Moore, MD [facility]   If 7PM-7AM, please contact night-coverage  02/19/2023, 12:39 PM

## 2023-02-19 NOTE — Care Management Obs Status (Signed)
MEDICARE OBSERVATION STATUS NOTIFICATION   Patient Details  Name: Ashley Gross MRN: [id number] Date of Birth: Nov 27, 1950   Medicare Observation Status Notification Given:  Yes    Jeffrey Graefe, LCSW 02/19/2023, 9:48 AM

## 2023-02-20 DIAGNOSIS — Z86010 Personal history of colonic polyps: Secondary | ICD-10-CM | POA: Diagnosis not present

## 2023-02-20 DIAGNOSIS — Z8673 Personal history of transient ischemic attack (TIA), and cerebral infarction without residual deficits: Secondary | ICD-10-CM | POA: Diagnosis not present

## 2023-02-20 DIAGNOSIS — Z881 Allergy status to other antibiotic agents status: Secondary | ICD-10-CM | POA: Diagnosis not present

## 2023-02-20 DIAGNOSIS — Z8701 Personal history of pneumonia (recurrent): Secondary | ICD-10-CM | POA: Diagnosis not present

## 2023-02-20 DIAGNOSIS — E1165 Type 2 diabetes mellitus with hyperglycemia: Secondary | ICD-10-CM | POA: Diagnosis present

## 2023-02-20 DIAGNOSIS — Z79899 Other long term (current) drug therapy: Secondary | ICD-10-CM | POA: Diagnosis not present

## 2023-02-20 DIAGNOSIS — R0602 Shortness of breath: Secondary | ICD-10-CM | POA: Diagnosis present

## 2023-02-20 DIAGNOSIS — Z833 Family history of diabetes mellitus: Secondary | ICD-10-CM | POA: Diagnosis not present

## 2023-02-20 DIAGNOSIS — Z9049 Acquired absence of other specified parts of digestive tract: Secondary | ICD-10-CM | POA: Diagnosis not present

## 2023-02-20 DIAGNOSIS — J45901 Unspecified asthma with (acute) exacerbation: Secondary | ICD-10-CM | POA: Diagnosis present

## 2023-02-20 DIAGNOSIS — Z886 Allergy status to analgesic agent status: Secondary | ICD-10-CM | POA: Diagnosis not present

## 2023-02-20 DIAGNOSIS — Z90710 Acquired absence of both cervix and uterus: Secondary | ICD-10-CM | POA: Diagnosis not present

## 2023-02-20 DIAGNOSIS — Z794 Long term (current) use of insulin: Secondary | ICD-10-CM | POA: Diagnosis not present

## 2023-02-20 DIAGNOSIS — E7800 Pure hypercholesterolemia, unspecified: Secondary | ICD-10-CM | POA: Diagnosis present

## 2023-02-20 DIAGNOSIS — J9601 Acute respiratory failure with hypoxia: Secondary | ICD-10-CM | POA: Diagnosis present

## 2023-02-20 DIAGNOSIS — Z1152 Encounter for screening for COVID-19: Secondary | ICD-10-CM | POA: Diagnosis not present

## 2023-02-20 DIAGNOSIS — K589 Irritable bowel syndrome without diarrhea: Secondary | ICD-10-CM | POA: Diagnosis present

## 2023-02-20 DIAGNOSIS — Z87442 Personal history of urinary calculi: Secondary | ICD-10-CM | POA: Diagnosis not present

## 2023-02-20 DIAGNOSIS — Z8249 Family history of ischemic heart disease and other diseases of the circulatory system: Secondary | ICD-10-CM | POA: Diagnosis not present

## 2023-02-20 DIAGNOSIS — A419 Sepsis, unspecified organism: Secondary | ICD-10-CM | POA: Diagnosis not present

## 2023-02-20 DIAGNOSIS — K219 Gastro-esophageal reflux disease without esophagitis: Secondary | ICD-10-CM | POA: Diagnosis present

## 2023-02-20 DIAGNOSIS — I10 Essential (primary) hypertension: Secondary | ICD-10-CM | POA: Diagnosis present

## 2023-02-20 DIAGNOSIS — Z888 Allergy status to other drugs, medicaments and biological substances status: Secondary | ICD-10-CM | POA: Diagnosis not present

## 2023-02-20 DIAGNOSIS — Z7951 Long term (current) use of inhaled steroids: Secondary | ICD-10-CM | POA: Diagnosis not present

## 2023-02-20 LAB — CULTURE, BLOOD (ROUTINE X 2)
Culture: NO GROWTH
Special Requests: ADEQUATE

## 2023-02-20 LAB — GLUCOSE, CAPILLARY
Glucose-Capillary: 90 mg/dL (ref 70–99)
Glucose-Capillary: 179 mg/dL — ABNORMAL HIGH (ref 70–99)
Glucose-Capillary: 264 mg/dL — ABNORMAL HIGH (ref 70–99)
Glucose-Capillary: 303 mg/dL — ABNORMAL HIGH (ref 70–99)

## 2023-02-20 MED ORDER — HYDROCORTISONE ACETATE 25 MG RE SUPP
25.0000 mg | Freq: Two times a day (BID) | RECTAL | Status: DC
  Administered 2023-02-20 – 2023-02-21 (×3): 25 mg via RECTAL
  Filled 2023-02-20 (×3): qty 1

## 2023-02-20 MED ORDER — OXYCODONE HCL 5 MG PO TABS: 5.0000 mg | ORAL_TABLET | ORAL | Status: DC | PRN

## 2023-02-20 MED ORDER — ARFORMOTEROL TARTRATE 15 MCG/2ML IN NEBU
15.0000 ug | INHALATION_SOLUTION | Freq: Two times a day (BID) | RESPIRATORY_TRACT | Status: DC
  Administered 2023-02-20 – 2023-02-21 (×3): 15 ug via RESPIRATORY_TRACT
  Filled 2023-02-20 (×4): qty 2

## 2023-02-20 MED ORDER — IPRATROPIUM-ALBUTEROL 0.5-2.5 (3) MG/3ML IN SOLN
3.0000 mL | Freq: Four times a day (QID) | RESPIRATORY_TRACT | Status: DC
  Administered 2023-02-20 – 2023-02-21 (×3): 3 mL via RESPIRATORY_TRACT
  Filled 2023-02-20 (×3): qty 3

## 2023-02-20 MED ORDER — METHYLPREDNISOLONE SODIUM SUCC 40 MG IJ SOLR
40.0000 mg | Freq: Two times a day (BID) | INTRAMUSCULAR | Status: DC
  Administered 2023-02-20 – 2023-02-21 (×3): 40 mg via INTRAVENOUS
  Filled 2023-02-20 (×3): qty 1

## 2023-02-20 MED ORDER — BUDESONIDE 0.25 MG/2ML IN SUSP
0.2500 mg | Freq: Two times a day (BID) | RESPIRATORY_TRACT | Status: DC
  Administered 2023-02-20 – 2023-02-21 (×2): 0.25 mg via RESPIRATORY_TRACT
  Filled 2023-02-20 (×2): qty 2

## 2023-02-20 NOTE — Progress Notes (Signed)
PROGRESS NOTE    Ashley Gross  JXB:[id number] DOB: Apr 16, 1951 DOA: 02/18/2023 PCP: Dale Custer, MD    Brief Narrative:   72 year old female history significant for asthma, hypertension, GERD, hyperlipidemia, history of CVA who presents to the ER approximately 2 weeks after resolution of her pneumonia treated with antibiotics complaining of 2 to 3 days of shortness of breath associated with cough and wheezing.  Treated with Levaquin for pneumonia.  Completed about 1 week prior to admission.  Had one-time fever as well as elevated heart rate.  Chest x-ray negative.  COVID-negative.  Flu negative.  RSV negative.   Initially admitted with this presumptive diagnosis of sepsis with unclear source.  Chest x-ray is negative.  Procalcitonin negative.  Sepsis has been ruled out.  All antibiotics have been discontinued.  4/20: Patient still with significant wheeze, cough, shortness of breath.  Respiratory viral panel negative.   Assessment & Plan:   Principal Problem:   Sepsis Active Problems:   Hypertension   GERD (gastroesophageal reflux disease)   Hypercholesterolemia   Type 2 diabetes mellitus with hyperglycemia  Shortness of breath associated with cough and wheezing Sepsis ruled out Acute exacerbation of asthma No clear infectious signs.  Chest x-ray negative urinalysis negative.  Procalcitonin negative.  Discontinue all broad-spectrum antibiotics.  Suspect possible asthma exacerbation in the setting of recent pneumonia.  Also unable to exclude respiratory viral syndrome considering fever.  Respiratory viral panel negative Plan: Will treat as decompensated asthma exacerbation.  IV steroids.  Scheduled and as needed nebulizers.  No indication for antibiotics.  Can discontinue respiratory isolation precautions.  Type 2 diabetes mellitus Basal bolus regimen Carb modified diet   Essential hypertension Blood pressure controlled Home regimen resumed   Hyperlipidemia PTA statin    GERD PPI  DVT prophylaxis: SQ lovenox Code Status: FULL Family Communication:None Disposition Plan: Status is: Observation The patient will require care spanning > 2 midnights and should be moved to inpatient because: Acute exacerbation of asthma requiring initiation of intravenous steroids   Level of care: Telemetry Medical  Consultants:  None  Procedures:  None  Antimicrobials: None   Subjective: Seen and examined.  Continues to endorse cough and shortness of breath  Objective: Vitals:   02/19/23 1626 02/19/23 2047 02/20/23 0518 02/20/23 0812  BP: (!) 155/61 (!) 140/51 (!) 150/61 (!) 144/76  Pulse: 82 65 62 71  Resp: Temp: 98.3 F (36.8 C) 98.1 F (36.7 C) 97.6 F (36.4 C) 98.2 F (36.8 C)  TempSrc: Oral Oral Oral   SpO2: 97% 98% 100% 100%  Weight:      Height:        Intake/Output Summary (Last 24 hours) at 02/20/2023 1157 Last data filed at 02/19/2023 1855 Gross per 24 hour  Intake 480 ml  Output --  Net 480 ml   Filed Weights   02/18/23 2047  Weight: 61.7 kg    Examination:  General exam: No acute distress Respiratory system: Coarse breath sounds bilaterally.  Normal work of breathing.  Diffuse wheeze.  Room air Cardiovascular system: S1-S2, RRR, no murmurs, no pedal edema Gastrointestinal system: Soft NT/ND, normal bowel sounds Central nervous system: Alert and oriented. No focal neurological deficits. Extremities: Symmetric 5 x 5 power. Skin: No rashes, lesions or ulcers Psychiatry: Judgement and insight appear normal. Mood & affect appropriate.     Data Reviewed: I have personally reviewed following labs and imaging studies  CBC: Recent Labs  Lab 02/18/23 2057 02/19/23 0519  WBC 13.3* 9.4  NEUTROABS 10.7*  --   HGB 10.6* 10.0*  HCT 32.8* 31.6*  MCV 87.5 87.8  PLT 268 255   Basic Metabolic Panel: Recent Labs  Lab 02/18/23 2057 02/19/23 0519  NA 136 140  K 3.5 3.2*  CL 105 109  CO2 23 23  GLUCOSE 133* 300*   BUN 11 11  CREATININE 0.84 0.81  CALCIUM 8.0* 8.0*   GFR: Estimated Creatinine Clearance: 54.2 mL/min (by C-G formula based on SCr of 0.81 mg/dL). Liver Function Tests: Recent Labs  Lab 02/19/23 0519  AST 27  ALT 13  ALKPHOS 119  BILITOT 0.7  PROT 5.4*  ALBUMIN 2.6*   No results for input(s): "LIPASE", "AMYLASE" in the last 168 hours. No results for input(s): "AMMONIA" in the last 168 hours. Coagulation Profile: Recent Labs  Lab 02/19/23 0519  INR 1.1   Cardiac Enzymes: No results for input(s): "CKTOTAL", "CKMB", "CKMBINDEX", "TROPONINI" in the last 168 hours. BNP (last 3 results) No results for input(s): "PROBNP" in the last 8760 hours. HbA1C: No results for input(s): "HGBA1C" in the last 72 hours. CBG: Recent Labs  Lab 02/19/23 0840 02/19/23 1141 02/19/23 1627 02/19/23 2047 02/20/23 0815  GLUCAP 223* 237* 72 142* 90   Lipid Profile: No results for input(s): "CHOL", "HDL", "LDLCALC", "TRIG", "CHOLHDL", "LDLDIRECT" in the last 72 hours. Thyroid Function Tests: No results for input(s): "TSH", "T4TOTAL", "FREET4", "T3FREE", "THYROIDAB" in the last 72 hours. Anemia Panel: No results for input(s): "VITAMINB12", "FOLATE", "FERRITIN", "TIBC", "IRON", "RETICCTPCT" in the last 72 hours. Sepsis Labs: Recent Labs  Lab 02/19/23 0000 02/19/23 0519  PROCALCITON  --  <0.10  LATICACIDVEN 1.4  --     Recent Results (from the past 240 hour(s))  SARS Coronavirus 2 by RT PCR (hospital order, performed in [facility] hospital lab) *cepheid single result test* Anterior Nasal Swab     Status: None   Collection Time: 02/18/23  8:57 PM   Specimen: Anterior Nasal Swab  Result Value Ref Range Status   SARS Coronavirus 2 by RT PCR NEGATIVE NEGATIVE Final    Comment: (NOTE) SARS-CoV-2 target nucleic acids are NOT DETECTED.  The SARS-CoV-2 RNA is generally detectable in upper and lower respiratory specimens during the acute phase of infection. The lowest concentration of  SARS-CoV-2 viral copies this assay can detect is 250 copies / mL. A negative result does not preclude SARS-CoV-2 infection and should not be used as the sole basis for treatment or other patient management decisions.  A negative result may occur with improper specimen collection / handling, submission of specimen other than nasopharyngeal swab, presence of viral mutation(s) within the areas targeted by this assay, and inadequate number of viral copies (<250 copies / mL). A negative result must be combined with clinical observations, patient history, and epidemiological information.  Fact Sheet for Patients:   RoadLapTop.co.za  Fact Sheet for Healthcare Providers: http://kim-miller.com/  This test is not yet approved or  cleared by the [place] FDA and has been authorized for detection and/or diagnosis of SARS-CoV-2 by FDA under an Emergency Use Authorization (EUA).  This EUA will remain in effect (meaning this test can be used) for the duration of the COVID-19 declaration under Section 564(b)(1) of the Act, 21 U.S.C. section 360bbb-3(b)(1), unless the authorization is terminated or revoked sooner.  Performed at [facility], [address] [place], [place] [address]   Blood Culture (routine x 2)     Status: None (Preliminary result)   Collection  Time: 02/18/23 11:59 PM   Specimen: BLOOD  Result Value Ref Range Status   Specimen Description BLOOD RIGHT ARM  Final   Special Requests   Final    BOTTLES DRAWN AEROBIC AND ANAEROBIC Blood Culture adequate volume   Culture   Final    NO GROWTH 1 DAY Performed at [facility], [address], [place], [place] [address]    Report Status PENDING  Incomplete  Blood Culture (routine x 2)     Status: None (Preliminary result)   Collection Time: 02/19/23 12:00 AM   Specimen: BLOOD  Result Value Ref Range Status   Specimen Description BLOOD LEFT ARM  Final   Special  Requests   Final    BOTTLES DRAWN AEROBIC AND ANAEROBIC Blood Culture results may not be optimal due to an inadequate volume of blood received in culture bottles   Culture   Final    NO GROWTH 1 DAY Performed at [facility], [address], [place], [place] [address]    Report Status PENDING  Incomplete  Resp panel by RT-PCR (RSV, Flu A&B, Covid) Urine, Clean Catch     Status: None   Collection Time: 02/19/23 12:00 AM   Specimen: Urine, Clean Catch; Nasal Swab  Result Value Ref Range Status   SARS Coronavirus 2 by RT PCR NEGATIVE NEGATIVE Final    Comment: (NOTE) SARS-CoV-2 target nucleic acids are NOT DETECTED.  The SARS-CoV-2 RNA is generally detectable in upper respiratory specimens during the acute phase of infection. The lowest concentration of SARS-CoV-2 viral copies this assay can detect is 138 copies/mL. A negative result does not preclude SARS-Cov-2 infection and should not be used as the sole basis for treatment or other patient management decisions. A negative result may occur with  improper specimen collection/handling, submission of specimen other than nasopharyngeal swab, presence of viral mutation(s) within the areas targeted by this assay, and inadequate number of viral copies(<138 copies/mL). A negative result must be combined with clinical observations, patient history, and epidemiological information. The expected result is Negative.  Fact Sheet for Patients:  BloggerCourse.com  Fact Sheet for Healthcare Providers:  SeriousBroker.it  This test is no t yet approved or cleared by the [place] FDA and  has been authorized for detection and/or diagnosis of SARS-CoV-2 by FDA under an Emergency Use Authorization (EUA). This EUA will remain  in effect (meaning this test can be used) for the duration of the COVID-19 declaration under Section 564(b)(1) of the Act, 21 U.S.C.section 360bbb-3(b)(1), unless  the authorization is terminated  or revoked sooner.       Influenza A by PCR NEGATIVE NEGATIVE Final   Influenza B by PCR NEGATIVE NEGATIVE Final    Comment: (NOTE) The Xpert Xpress SARS-CoV-2/FLU/RSV plus assay is intended as an aid in the diagnosis of influenza from Nasopharyngeal swab specimens and should not be used as a sole basis for treatment. Nasal washings and aspirates are unacceptable for Xpert Xpress SARS-CoV-2/FLU/RSV testing.  Fact Sheet for Patients: BloggerCourse.com  Fact Sheet for Healthcare Providers: SeriousBroker.it  This test is not yet approved or cleared by the [place] FDA and has been authorized for detection and/or diagnosis of SARS-CoV-2 by FDA under an Emergency Use Authorization (EUA). This EUA will remain in effect (meaning this test can be used) for the duration of the COVID-19 declaration under Section 564(b)(1) of the Act, 21 U.S.C. section 360bbb-3(b)(1), unless the authorization is terminated or revoked.     Resp Syncytial Virus by PCR  NEGATIVE NEGATIVE Final    Comment: (NOTE) Fact Sheet for Patients: BloggerCourse.com  Fact Sheet for Healthcare Providers: SeriousBroker.it  This test is not yet approved or cleared by the [place] FDA and has been authorized for detection and/or diagnosis of SARS-CoV-2 by FDA under an Emergency Use Authorization (EUA). This EUA will remain in effect (meaning this test can be used) for the duration of the COVID-19 declaration under Section 564(b)(1) of the Act, 21 U.S.C. section 360bbb-3(b)(1), unless the authorization is terminated or revoked.  Performed at [facility], [address] [place], [place] [address]   MRSA Next Gen by PCR, Nasal     Status: None   Collection Time: 02/19/23  9:00 AM   Specimen: Nasal Mucosa; Nasal Swab  Result Value Ref Range Status   MRSA by PCR Next  Gen NOT DETECTED NOT DETECTED Final    Comment: (NOTE) The GeneXpert MRSA Assay (FDA approved for NASAL specimens only), is one component of a comprehensive MRSA colonization surveillance program. It is not intended to diagnose MRSA infection nor to guide or monitor treatment for MRSA infections. Test performance is not FDA approved in patients less than 69 years old. Performed at [facility], [address] [place], [place] [address]   Respiratory (~20 pathogens) panel by PCR     Status: None   Collection Time: 02/19/23 10:29 AM   Specimen: Nasopharyngeal Swab; Respiratory  Result Value Ref Range Status   Adenovirus NOT DETECTED NOT DETECTED Final   Coronavirus 229E NOT DETECTED NOT DETECTED Final    Comment: (NOTE) The Coronavirus on the Respiratory Panel, DOES NOT test for the novel  Coronavirus (2019 nCoV)    Coronavirus HKU1 NOT DETECTED NOT DETECTED Final   Coronavirus NL63 NOT DETECTED NOT DETECTED Final   Coronavirus OC43 NOT DETECTED NOT DETECTED Final   Metapneumovirus NOT DETECTED NOT DETECTED Final   Rhinovirus / Enterovirus NOT DETECTED NOT DETECTED Final   Influenza A NOT DETECTED NOT DETECTED Final   Influenza B NOT DETECTED NOT DETECTED Final   Parainfluenza Virus 1 NOT DETECTED NOT DETECTED Final   Parainfluenza Virus 2 NOT DETECTED NOT DETECTED Final   Parainfluenza Virus 3 NOT DETECTED NOT DETECTED Final   Parainfluenza Virus 4 NOT DETECTED NOT DETECTED Final   Respiratory Syncytial Virus NOT DETECTED NOT DETECTED Final   Bordetella pertussis NOT DETECTED NOT DETECTED Final   Bordetella Parapertussis NOT DETECTED NOT DETECTED Final   Chlamydophila pneumoniae NOT DETECTED NOT DETECTED Final   Mycoplasma pneumoniae NOT DETECTED NOT DETECTED Final    Comment: Performed at [facility], [address], [place], [place] [address]         Radiology Studies: DG Chest 2 View  Result Date: 02/18/2023 CLINICAL DATA:  Cough EXAM: CHEST - 2 VIEW  COMPARISON:  None Available. FINDINGS: Lungs are clear.  No pleural effusion or pneumothorax. The heart is normal in size. Visualized osseous structures are within normal limits. IMPRESSION: Normal chest radiographs. Electronically Signed   By: Charline Bills M.D.   On: 02/18/2023 21:15        Scheduled Meds:  amLODipine  5 mg Oral Daily   arformoterol  15 mcg Nebulization BID   benzonatate  200 mg Oral TID   budesonide (PULMICORT) nebulizer solution  0.25 mg Nebulization BID   cholecalciferol  2,000 Units Oral Daily   enoxaparin (LOVENOX) injection  40 mg Subcutaneous Q24H   gabapentin  600 mg Oral Daily   guaiFENesin  600 mg Oral BID   hydrocortisone  25 mg Rectal BID   insulin aspart  0-15 Units Subcutaneous TID WC   insulin aspart  0-5 Units Subcutaneous QHS   insulin glargine-yfgn  16 Units Subcutaneous Daily   ipratropium-albuterol  3 mL Nebulization Q6H   lipase/protease/amylase  72,000 Units Oral TID WC   loperamide  4 mg Oral Daily   losartan  100 mg Oral QHS   melatonin  10 mg Oral QHS   methylPREDNISolone (SOLU-MEDROL) injection  40 mg Intravenous Q12H   pantoprazole  40 mg Oral Daily   rosuvastatin  10 mg Oral Daily   Continuous Infusions:   LOS: 1 day   Tresa Moore, MD [facility]   If 7PM-7AM, please contact night-coverage  02/20/2023, 11:57 AM

## 2023-02-21 DIAGNOSIS — A419 Sepsis, unspecified organism: Secondary | ICD-10-CM | POA: Diagnosis not present

## 2023-02-21 LAB — GLUCOSE, CAPILLARY
Glucose-Capillary: 186 mg/dL — ABNORMAL HIGH (ref 70–99)
Glucose-Capillary: 262 mg/dL — ABNORMAL HIGH (ref 70–99)
Glucose-Capillary: 146 mg/dL — ABNORMAL HIGH (ref 70–99)

## 2023-02-21 LAB — CULTURE, BLOOD (ROUTINE X 2)

## 2023-02-21 MED ORDER — IPRATROPIUM-ALBUTEROL 0.5-2.5 (3) MG/3ML IN SOLN: 3.0000 mL | Freq: Two times a day (BID) | RESPIRATORY_TRACT | Status: DC

## 2023-02-21 MED ORDER — FLUCONAZOLE 50 MG PO TABS
150.0000 mg | ORAL_TABLET | Freq: Once | ORAL | Status: AC
  Administered 2023-02-21: 150 mg via ORAL
  Filled 2023-02-21: qty 1

## 2023-02-21 MED ORDER — GUAIFENESIN ER 600 MG PO TB12: 600.0000 mg | ORAL_TABLET | Freq: Two times a day (BID) | ORAL | 0 refills | Status: AC

## 2023-02-21 MED ORDER — FLUCONAZOLE 100 MG PO TABS: 100.0000 mg | ORAL_TABLET | Freq: Every day | ORAL | 0 refills | Status: AC | PRN

## 2023-02-21 MED ORDER — PREDNISONE 20 MG PO TABS: 20.0000 mg | ORAL_TABLET | Freq: Every day | ORAL | 0 refills | Status: AC

## 2023-02-21 MED ORDER — BENZONATATE 200 MG PO CAPS: 200.0000 mg | ORAL_CAPSULE | Freq: Three times a day (TID) | ORAL | 0 refills | Status: DC | PRN

## 2023-02-21 MED ORDER — IPRATROPIUM-ALBUTEROL 0.5-2.5 (3) MG/3ML IN SOLN
3.0000 mL | Freq: Once | RESPIRATORY_TRACT | Status: AC
  Administered 2023-02-21: 3 mL via RESPIRATORY_TRACT
  Filled 2023-02-21: qty 3

## 2023-02-21 MED ORDER — IPRATROPIUM-ALBUTEROL 0.5-2.5 (3) MG/3ML IN SOLN: 3.0000 mL | RESPIRATORY_TRACT | 0 refills | Status: DC | PRN

## 2023-02-21 NOTE — Discharge Summary (Signed)
Physician Discharge Summary  Ashley Gross RUE:[id number] DOB: 1951/04/13 DOA: 02/18/2023  PCP: Dale Rockingham, MD  Admit date: 02/18/2023 Discharge date: 02/21/2023  Admitted From: Home Disposition:  Home  Recommendations for Outpatient Follow-up:  Follow up with PCP in 1-2 weeks   Home Health: No Equipment/Devices:None   Discharge Condition:Stable  CODE STATUS:FULL  Diet recommendation: Reg  Brief/Interim Summary: 72 year old female history significant for asthma, hypertension, GERD, hyperlipidemia, history of CVA who presents to the ER approximately 2 weeks after resolution of her pneumonia treated with antibiotics complaining of 2 to 3 days of shortness of breath associated with cough and wheezing.  Treated with Levaquin for pneumonia.  Completed about 1 week prior to admission.  Had one-time fever as well as elevated heart rate.  Chest x-ray negative.  COVID-negative.  Flu negative.  RSV negative.   Initially admitted with this presumptive diagnosis of sepsis with unclear source.  Chest x-ray is negative.  Procalcitonin negative.  Sepsis has been ruled out.  All antibiotics have been discontinued.   4/20: Patient still with significant wheeze, cough, shortness of breath.  Respiratory viral panel negative. 4/21: Wheeze cough and shortness of breath markedly improved.  Patient appropriate for discharge home at this time.    Discharge Diagnoses:  Principal Problem:   Sepsis Active Problems:   Hypertension   GERD (gastroesophageal reflux disease)   Hypercholesterolemia   Type 2 diabetes mellitus with hyperglycemia   Asthma exacerbation  Shortness of breath associated with cough and wheezing Sepsis ruled out Acute exacerbation of asthma No clear infectious signs.  Chest x-ray negative urinalysis negative.  Procalcitonin negative.  Discontinue all broad-spectrum antibiotics.  Suspect possible asthma exacerbation in the setting of recent pneumonia.  Also unable to exclude  respiratory viral syndrome considering fever.  Respiratory viral panel negative Plan: Initiated treatment in house for suspected asthma exacerbation.  IV steroids.  Scheduled and as needed bronchodilators.  Patient improved substantially.  Stable for discharge home.  Will recommend to resume home bronchodilator regimen.  As needed DuoNebs prescribed.  Short course of p.o. prednisone 20 mg daily prescribed.  Stable for discharge home at this time.  Discharge Instructions  Discharge Instructions     Diet - low sodium heart healthy   Complete by: As directed    Increase activity slowly   Complete by: As directed       Allergies as of 02/21/2023       Reactions   Aspirin Other (See Comments)   Increased bleeding   Beta Adrenergic Blockers    3rd degree blockage    Lisinopril Cough   Mtx Support [cobalamine Combinations] Other (See Comments)   Respiratory symptoms   Nsaids    Vasotec [enalapril] Cough   Verapamil Other (See Comments)   Heart block   Voltaren [diclofenac Sodium]    Pt unsure of reaction    Erythromycin Other (See Comments)   Gi intolerance   Indocin [indomethacin]    Upset stomach   Jardiance [empagliflozin] Other (See Comments)   Recurrent yeast infections, even with washout and rechallenge        Medication List     STOP taking these medications    levofloxacin 500 MG tablet Commonly known as: LEVAQUIN   nystatin cream Commonly known as: MYCOSTATIN       TAKE these medications    acetaminophen 500 MG tablet Commonly known as: TYLENOL Take 2 tablets (1,000 mg total) by mouth every 6 (six) hours as needed for mild pain.  albuterol 108 (90 Base) MCG/ACT inhaler Commonly known as: VENTOLIN HFA Inhale 2 puffs into the lungs every 6 (six) hours as needed for wheezing or shortness of breath.   amLODipine 5 MG tablet Commonly known as: NORVASC TAKE 1 TABLET(5 MG) BY MOUTH TWICE DAILY   benzonatate 200 MG capsule Commonly known as:  TESSALON Take 1 capsule (200 mg total) by mouth 3 (three) times daily as needed for cough.   budesonide-formoterol 160-4.5 MCG/ACT inhaler Commonly known as: SYMBICORT Inhale 2 puffs into the lungs 2 (two) times daily. What changed:  when to take this reasons to take this   Creon 36000 UNITS Cpep capsule Generic drug: lipase/protease/amylase Take 1-2 capsules (36,000-72,000 Units total) by mouth See admin instructions. Take 720000 units with each meal and 56213 units with snacks   diphenhydramine-acetaminophen 25-500 MG Tabs tablet Commonly known as: TYLENOL PM Take 2 tablets by mouth at bedtime as needed (sleep).   fluconazole 100 MG tablet Commonly known as: Diflucan Take 1 tablet (100 mg total) by mouth daily as needed for up to 2 days (Persistent vaginal irritation). Start taking on: February 22, 2023   fluticasone 110 MCG/ACT inhaler Commonly known as: FLOVENT HFA Inhale 2 puffs into the lungs 2 (two) times daily as needed (shortness of breath).   gabapentin 600 MG tablet Commonly known as: NEURONTIN Take 600 mg by mouth daily.   guaiFENesin 600 MG 12 hr tablet Commonly known as: MUCINEX Take 1 tablet (600 mg total) by mouth 2 (two) times daily for 7 days.   ipratropium-albuterol 0.5-2.5 (3) MG/3ML Soln Commonly known as: DUONEB Take 3 mLs by nebulization every 4 (four) hours as needed.   loperamide 2 MG capsule Commonly known as: IMODIUM Take 4 mg by mouth daily.   losartan 100 MG tablet Commonly known as: COZAAR TAKE 1 TABLET(100 MG) BY MOUTH AT BEDTIME   Melatonin 10 MG Tabs Take 10 mg by mouth at bedtime.   omeprazole 20 MG capsule Commonly known as: PRILOSEC TAKE 1 CAPSULE(20 MG) BY MOUTH TWICE DAILY   ONE TOUCH ULTRA TEST test strip Generic drug: glucose blood PATIENT NEEDS NEW METER STRIPS AND LANCETS FOR ONE TOUCH. HER INSURANCE NO LONGER COVERS ACCUCHEK.   predniSONE 20 MG tablet Commonly known as: DELTASONE Take 1 tablet (20 mg total) by mouth  daily for 5 days. Start taking on: February 22, 2023   rosuvastatin 10 MG tablet Commonly known as: CRESTOR TAKE 1 TABLET(10 MG) BY MOUTH DAILY   Tresiba FlexTouch 100 UNIT/ML FlexTouch Pen Generic drug: insulin degludec Inject 16 Units into the skin daily.   Vitamin D 50 MCG (2000 UT) tablet Take 2,000 Units by mouth daily.        Allergies  Allergen Reactions   Aspirin Other (See Comments)    Increased bleeding   Beta Adrenergic Blockers     3rd degree blockage    Lisinopril Cough   Mtx Support [Cobalamine Combinations] Other (See Comments)    Respiratory symptoms   Nsaids    Vasotec [Enalapril] Cough   Verapamil Other (See Comments)    Heart block   Voltaren [Diclofenac Sodium]     Pt unsure of reaction    Erythromycin Other (See Comments)    Gi intolerance   Indocin [Indomethacin]     Upset stomach   Jardiance [Empagliflozin] Other (See Comments)    Recurrent yeast infections, even with washout and rechallenge    Consultations: None   Procedures/Studies: DG Chest 2 View  Result Date: 02/18/2023  CLINICAL DATA:  Cough EXAM: CHEST - 2 VIEW COMPARISON:  None Available. FINDINGS: Lungs are clear.  No pleural effusion or pneumothorax. The heart is normal in size. Visualized osseous structures are within normal limits. IMPRESSION: Normal chest radiographs. Electronically Signed   By: Charline Bills M.D.   On: 02/18/2023 21:15   VAS [place] LOWER EXTREMITY VENOUS REFLUX  Result Date: 02/08/2023  Lower Venous Reflux Study Patient Name:  VANGIE HENTHORN  Date of Exam:   02/08/2023 Medical Rec #: [id number]         Accession #:    [id number] Date of Birth: 1951-03-30         Patient Gender: F Patient Age:   84 years Exam Location:  [facility] Procedure:      VAS [place] LOWER EXTREMITY VENOUS REFLUX Referring Phys: Heath Lark --------------------------------------------------------------------------------  Indications: Swelling, and Erythema.  Risk Factors: None  identified. Comparison Study: 08/28/16 B/L LE DVT - negative for DVT. Performing Technologist: Lowell Guitar RVT, RDMS  Examination Guidelines: A complete evaluation includes B-mode imaging, spectral Doppler, color Doppler, and power Doppler as needed of all accessible portions of each vessel. Bilateral testing is considered an integral part of a complete examination. Limited examinations for reoccurring indications may be performed as noted. The reflux portion of the exam is performed with the patient in reverse Trendelenburg. Significant venous reflux is defined as >500 ms in the superficial venous system, and >1 second in the deep venous system.  Venous Reflux Times +--------------+---------+------+-----------+------------+--------+ RIGHT         Reflux NoRefluxReflux TimeDiameter cmsComments                         Yes                                  +--------------+---------+------+-----------+------------+--------+ CFV           no                                             +--------------+---------+------+-----------+------------+--------+ FV prox       no                                             +--------------+---------+------+-----------+------------+--------+ FV mid        no                                             +--------------+---------+------+-----------+------------+--------+ FV dist       no                                             +--------------+---------+------+-----------+------------+--------+ Popliteal     no                                             +--------------+---------+------+-----------+------------+--------+  GSV at [facility]              yes    >500 ms      0.74             +--------------+---------+------+-----------+------------+--------+ GSV prox thighno                            0.73             +--------------+---------+------+-----------+------------+--------+ GSV mid thigh no                             0.35             +--------------+---------+------+-----------+------------+--------+ GSV dist thighno                            0.37             +--------------+---------+------+-----------+------------+--------+ GSV at knee             yes    >500 ms      0.34             +--------------+---------+------+-----------+------------+--------+ GSV prox calf           yes    >500 ms      0.26             +--------------+---------+------+-----------+------------+--------+ SSV Pop Fossa no                            0.08             +--------------+---------+------+-----------+------------+--------+ SSV prox calf no                            0.21             +--------------+---------+------+-----------+------------+--------+   Summary: Right: - No evidence of deep vein thrombosis seen in the right lower extremity, from the common femoral through the popliteal veins. - No evidence of superficial venous thrombosis in the right lower extremity.  - Venous reflux is noted in the right sapheno-femoral junction. - Venous reflux is noted in the right greater saphenous vein in the calf.  *See table(s) above for measurements and observations. Electronically signed by Coral Else MD on 02/08/2023 at 4:24:02 PM.    Final       Subjective: Seen and examined on the day of discharge.  Stable no distress.  Appropriate for discharge home.  Discharge Exam: Vitals:   02/21/23 0724 02/21/23 0745  BP:  (!) 147/54  Pulse: 77 99  Resp: 16 18  Temp:  98 F (36.7 C)  SpO2: 97% 100%   Vitals:   02/20/23 2121 02/21/23 0437 02/21/23 0724 02/21/23 0745  BP: (!) 140/57 (!) 131/54  (!) 147/54  Pulse: 65 63 77 99  Resp: 20 18 16 18   Temp: 98.3 F (36.8 C) 98 F (36.7 C)  98 F (36.7 C)  TempSrc:      SpO2: 97% 98% 97% 100%  Weight:      Height:        General: Pt is alert, awake, not in acute distress Cardiovascular: RRR, S1/S2 +, no rubs, no gallops Respiratory: CTA bilaterally, no  wheezing, no rhonchi Abdominal: Soft, NT, ND, bowel sounds + Extremities: no edema, no cyanosis  The results of significant diagnostics from this hospitalization (including imaging, microbiology, ancillary and laboratory) are listed below for reference.     Microbiology: Recent Results (from the past 240 hour(s))  SARS Coronavirus 2 by RT PCR (hospital order, performed in [facility] hospital lab) *cepheid single result test* Anterior Nasal Swab     Status: None   Collection Time: 02/18/23  8:57 PM   Specimen: Anterior Nasal Swab  Result Value Ref Range Status   SARS Coronavirus 2 by RT PCR NEGATIVE NEGATIVE Final    Comment: (NOTE) SARS-CoV-2 target nucleic acids are NOT DETECTED.  The SARS-CoV-2 RNA is generally detectable in upper and lower respiratory specimens during the acute phase of infection. The lowest concentration of SARS-CoV-2 viral copies this assay can detect is 250 copies / mL. A negative result does not preclude SARS-CoV-2 infection and should not be used as the sole basis for treatment or other patient management decisions.  A negative result may occur with improper specimen collection / handling, submission of specimen other than nasopharyngeal swab, presence of viral mutation(s) within the areas targeted by this assay, and inadequate number of viral copies (<250 copies / mL). A negative result must be combined with clinical observations, patient history, and epidemiological information.  Fact Sheet for Patients:   RoadLapTop.co.za  Fact Sheet for Healthcare Providers: http://kim-miller.com/  This test is not yet approved or  cleared by the [place] FDA and has been authorized for detection and/or diagnosis of SARS-CoV-2 by FDA under an Emergency Use Authorization (EUA).  This EUA will remain in effect (meaning this test can be used) for the duration of the COVID-19 declaration under Section 564(b)(1) of  the Act, 21 U.S.C. section 360bbb-3(b)(1), unless the authorization is terminated or revoked sooner.  Performed at [facility], [address] [place], [place] [address]   Blood Culture (routine x 2)     Status: None (Preliminary result)   Collection Time: 02/18/23 11:59 PM   Specimen: BLOOD  Result Value Ref Range Status   Specimen Description BLOOD RIGHT ARM  Final   Special Requests   Final    BOTTLES DRAWN AEROBIC AND ANAEROBIC Blood Culture adequate volume   Culture   Final    NO GROWTH 2 DAYS Performed at [facility], [address], [place], [place] [address]    Report Status PENDING  Incomplete  Blood Culture (routine x 2)     Status: None (Preliminary result)   Collection Time: 02/19/23 12:00 AM   Specimen: BLOOD  Result Value Ref Range Status   Specimen Description BLOOD LEFT ARM  Final   Special Requests   Final    BOTTLES DRAWN AEROBIC AND ANAEROBIC Blood Culture results may not be optimal due to an inadequate volume of blood received in culture bottles   Culture   Final    NO GROWTH 2 DAYS Performed at [facility], [address], [place], [place] [address]    Report Status PENDING  Incomplete  Resp panel by RT-PCR (RSV, Flu A&B, Covid) Urine, Clean Catch     Status: None   Collection Time: 02/19/23 12:00 AM   Specimen: Urine, Clean Catch; Nasal Swab  Result Value Ref Range Status   SARS Coronavirus 2 by RT PCR NEGATIVE NEGATIVE Final    Comment: (NOTE) SARS-CoV-2 target nucleic acids are NOT DETECTED.  The SARS-CoV-2 RNA is generally detectable in upper respiratory specimens during the acute phase of infection. The lowest concentration of SARS-CoV-2 viral copies  this assay can detect is 138 copies/mL. A negative result does not preclude SARS-Cov-2 infection and should not be used as the sole basis for treatment or other patient management decisions. A negative result may occur with  improper specimen collection/handling,  submission of specimen other than nasopharyngeal swab, presence of viral mutation(s) within the areas targeted by this assay, and inadequate number of viral copies(<138 copies/mL). A negative result must be combined with clinical observations, patient history, and epidemiological information. The expected result is Negative.  Fact Sheet for Patients:  BloggerCourse.com  Fact Sheet for Healthcare Providers:  SeriousBroker.it  This test is no t yet approved or cleared by the [place] FDA and  has been authorized for detection and/or diagnosis of SARS-CoV-2 by FDA under an Emergency Use Authorization (EUA). This EUA will remain  in effect (meaning this test can be used) for the duration of the COVID-19 declaration under Section 564(b)(1) of the Act, 21 U.S.C.section 360bbb-3(b)(1), unless the authorization is terminated  or revoked sooner.       Influenza A by PCR NEGATIVE NEGATIVE Final   Influenza B by PCR NEGATIVE NEGATIVE Final    Comment: (NOTE) The Xpert Xpress SARS-CoV-2/FLU/RSV plus assay is intended as an aid in the diagnosis of influenza from Nasopharyngeal swab specimens and should not be used as a sole basis for treatment. Nasal washings and aspirates are unacceptable for Xpert Xpress SARS-CoV-2/FLU/RSV testing.  Fact Sheet for Patients: BloggerCourse.com  Fact Sheet for Healthcare Providers: SeriousBroker.it  This test is not yet approved or cleared by the [place] FDA and has been authorized for detection and/or diagnosis of SARS-CoV-2 by FDA under an Emergency Use Authorization (EUA). This EUA will remain in effect (meaning this test can be used) for the duration of the COVID-19 declaration under Section 564(b)(1) of the Act, 21 U.S.C. section 360bbb-3(b)(1), unless the authorization is terminated or revoked.     Resp Syncytial Virus by PCR NEGATIVE  NEGATIVE Final    Comment: (NOTE) Fact Sheet for Patients: BloggerCourse.com  Fact Sheet for Healthcare Providers: SeriousBroker.it  This test is not yet approved or cleared by the [place] FDA and has been authorized for detection and/or diagnosis of SARS-CoV-2 by FDA under an Emergency Use Authorization (EUA). This EUA will remain in effect (meaning this test can be used) for the duration of the COVID-19 declaration under Section 564(b)(1) of the Act, 21 U.S.C. section 360bbb-3(b)(1), unless the authorization is terminated or revoked.  Performed at [facility], [address] [place], [place] [address]   MRSA Next Gen by PCR, Nasal     Status: None   Collection Time: 02/19/23  9:00 AM   Specimen: Nasal Mucosa; Nasal Swab  Result Value Ref Range Status   MRSA by PCR Next Gen NOT DETECTED NOT DETECTED Final    Comment: (NOTE) The GeneXpert MRSA Assay (FDA approved for NASAL specimens only), is one component of a comprehensive MRSA colonization surveillance program. It is not intended to diagnose MRSA infection nor to guide or monitor treatment for MRSA infections. Test performance is not FDA approved in patients less than 52 years old. Performed at [facility], [address] [place], [place] [address]   Respiratory (~20 pathogens) panel by PCR     Status: None   Collection Time: 02/19/23 10:29 AM   Specimen: Nasopharyngeal Swab; Respiratory  Result Value Ref Range Status   Adenovirus NOT DETECTED NOT DETECTED Final   Coronavirus 229E NOT DETECTED NOT DETECTED Final  Comment: (NOTE) The Coronavirus on the Respiratory Panel, DOES NOT test for the novel  Coronavirus (2019 nCoV)    Coronavirus HKU1 NOT DETECTED NOT DETECTED Final   Coronavirus NL63 NOT DETECTED NOT DETECTED Final   Coronavirus OC43 NOT DETECTED NOT DETECTED Final   Metapneumovirus NOT DETECTED NOT DETECTED Final   Rhinovirus /  Enterovirus NOT DETECTED NOT DETECTED Final   Influenza A NOT DETECTED NOT DETECTED Final   Influenza B NOT DETECTED NOT DETECTED Final   Parainfluenza Virus 1 NOT DETECTED NOT DETECTED Final   Parainfluenza Virus 2 NOT DETECTED NOT DETECTED Final   Parainfluenza Virus 3 NOT DETECTED NOT DETECTED Final   Parainfluenza Virus 4 NOT DETECTED NOT DETECTED Final   Respiratory Syncytial Virus NOT DETECTED NOT DETECTED Final   Bordetella pertussis NOT DETECTED NOT DETECTED Final   Bordetella Parapertussis NOT DETECTED NOT DETECTED Final   Chlamydophila pneumoniae NOT DETECTED NOT DETECTED Final   Mycoplasma pneumoniae NOT DETECTED NOT DETECTED Final    Comment: Performed at [facility], [address] [place], [place] [address]     Labs: BNP (last 3 results) Recent Labs    07/02/22 1144  BNP 192.1*   Basic Metabolic Panel: Recent Labs  Lab 02/18/23 2057 02/19/23 0519  NA 136 140  K 3.5 3.2*  CL 105 109  CO2 23 23  GLUCOSE 133* 300*  BUN 11 11  CREATININE 0.84 0.81  CALCIUM 8.0* 8.0*   Liver Function Tests: Recent Labs  Lab 02/19/23 0519  AST 27  ALT 13  ALKPHOS 119  BILITOT 0.7  PROT 5.4*  ALBUMIN 2.6*   No results for input(s): "LIPASE", "AMYLASE" in the last 168 hours. No results for input(s): "AMMONIA" in the last 168 hours. CBC: Recent Labs  Lab 02/18/23 2057 02/19/23 0519  WBC 13.3* 9.4  NEUTROABS 10.7*  --   HGB 10.6* 10.0*  HCT 32.8* 31.6*  MCV 87.5 87.8  PLT 268 255   Cardiac Enzymes: No results for input(s): "CKTOTAL", "CKMB", "CKMBINDEX", "TROPONINI" in the last 168 hours. BNP: Invalid input(s): "POCBNP" CBG: Recent Labs  Lab 02/20/23 1203 02/20/23 1545 02/20/23 2128 02/21/23 0745 02/21/23 1200  GLUCAP 179* 264* 303* 186* 262*   D-Dimer No results for input(s): "DDIMER" in the last 72 hours. Hgb A1c No results for input(s): "HGBA1C" in the last 72 hours. Lipid Profile No results for input(s): "CHOL", "HDL", "LDLCALC", "TRIG",  "CHOLHDL", "LDLDIRECT" in the last 72 hours. Thyroid function studies No results for input(s): "TSH", "T4TOTAL", "T3FREE", "THYROIDAB" in the last 72 hours.  Invalid input(s): "FREET3" Anemia work up No results for input(s): "VITAMINB12", "FOLATE", "FERRITIN", "TIBC", "IRON", "RETICCTPCT" in the last 72 hours. Urinalysis    Component Value Date/Time   COLORURINE STRAW (A) 02/19/2023 0000   APPEARANCEUR CLEAR (A) 02/19/2023 0000   LABSPEC 1.005 02/19/2023 0000   PHURINE 6.0 02/19/2023 0000   GLUCOSEU NEGATIVE 02/19/2023 0000   GLUCOSEU NEGATIVE 12/21/2022 1025   HGBUR NEGATIVE 02/19/2023 0000   BILIRUBINUR NEGATIVE 02/19/2023 0000   KETONESUR NEGATIVE 02/19/2023 0000   PROTEINUR NEGATIVE 02/19/2023 0000   UROBILINOGEN 0.2 12/21/2022 1025   NITRITE NEGATIVE 02/19/2023 0000   LEUKOCYTESUR TRACE (A) 02/19/2023 0000   Sepsis Labs Recent Labs  Lab 02/18/23 2057 02/19/23 0519  WBC 13.3* 9.4   Microbiology Recent Results (from the past 240 hour(s))  SARS Coronavirus 2 by RT PCR (hospital order, performed in [facility] hospital lab) *cepheid single result test* Anterior Nasal Swab  Status: None   Collection Time: 02/18/23  8:57 PM   Specimen: Anterior Nasal Swab  Result Value Ref Range Status   SARS Coronavirus 2 by RT PCR NEGATIVE NEGATIVE Final    Comment: (NOTE) SARS-CoV-2 target nucleic acids are NOT DETECTED.  The SARS-CoV-2 RNA is generally detectable in upper and lower respiratory specimens during the acute phase of infection. The lowest concentration of SARS-CoV-2 viral copies this assay can detect is 250 copies / mL. A negative result does not preclude SARS-CoV-2 infection and should not be used as the sole basis for treatment or other patient management decisions.  A negative result may occur with improper specimen collection / handling, submission of specimen other than nasopharyngeal swab, presence of viral mutation(s) within the areas targeted by this assay,  and inadequate number of viral copies (<250 copies / mL). A negative result must be combined with clinical observations, patient history, and epidemiological information.  Fact Sheet for Patients:   RoadLapTop.co.za  Fact Sheet for Healthcare Providers: http://kim-miller.com/  This test is not yet approved or  cleared by the [place] FDA and has been authorized for detection and/or diagnosis of SARS-CoV-2 by FDA under an Emergency Use Authorization (EUA).  This EUA will remain in effect (meaning this test can be used) for the duration of the COVID-19 declaration under Section 564(b)(1) of the Act, 21 U.S.C. section 360bbb-3(b)(1), unless the authorization is terminated or revoked sooner.  Performed at [facility], [address] [place], [place] [address]   Blood Culture (routine x 2)     Status: None (Preliminary result)   Collection Time: 02/18/23 11:59 PM   Specimen: BLOOD  Result Value Ref Range Status   Specimen Description BLOOD RIGHT ARM  Final   Special Requests   Final    BOTTLES DRAWN AEROBIC AND ANAEROBIC Blood Culture adequate volume   Culture   Final    NO GROWTH 2 DAYS Performed at [facility], [address] [place], [place] [address]    Report Status PENDING  Incomplete  Blood Culture (routine x 2)     Status: None (Preliminary result)   Collection Time: 02/19/23 12:00 AM   Specimen: BLOOD  Result Value Ref Range Status   Specimen Description BLOOD LEFT ARM  Final   Special Requests   Final    BOTTLES DRAWN AEROBIC AND ANAEROBIC Blood Culture results may not be optimal due to an inadequate volume of blood received in culture bottles   Culture   Final    NO GROWTH 2 DAYS Performed at [facility], [address] [place], [place] [address]    Report Status PENDING  Incomplete  Resp panel by RT-PCR (RSV, Flu A&B, Covid) Urine, Clean Catch     Status: None   Collection Time:  02/19/23 12:00 AM   Specimen: Urine, Clean Catch; Nasal Swab  Result Value Ref Range Status   SARS Coronavirus 2 by RT PCR NEGATIVE NEGATIVE Final    Comment: (NOTE) SARS-CoV-2 target nucleic acids are NOT DETECTED.  The SARS-CoV-2 RNA is generally detectable in upper respiratory specimens during the acute phase of infection. The lowest concentration of SARS-CoV-2 viral copies this assay can detect is 138 copies/mL. A negative result does not preclude SARS-Cov-2 infection and should not be used as the sole basis for treatment or other patient management decisions. A negative result may occur with  improper specimen collection/handling, submission of specimen other than nasopharyngeal swab, presence of viral mutation(s) within the areas targeted  by this assay, and inadequate number of viral copies(<138 copies/mL). A negative result must be combined with clinical observations, patient history, and epidemiological information. The expected result is Negative.  Fact Sheet for Patients:  BloggerCourse.com  Fact Sheet for Healthcare Providers:  SeriousBroker.it  This test is no t yet approved or cleared by the [place] FDA and  has been authorized for detection and/or diagnosis of SARS-CoV-2 by FDA under an Emergency Use Authorization (EUA). This EUA will remain  in effect (meaning this test can be used) for the duration of the COVID-19 declaration under Section 564(b)(1) of the Act, 21 U.S.C.section 360bbb-3(b)(1), unless the authorization is terminated  or revoked sooner.       Influenza A by PCR NEGATIVE NEGATIVE Final   Influenza B by PCR NEGATIVE NEGATIVE Final    Comment: (NOTE) The Xpert Xpress SARS-CoV-2/FLU/RSV plus assay is intended as an aid in the diagnosis of influenza from Nasopharyngeal swab specimens and should not be used as a sole basis for treatment. Nasal washings and aspirates are unacceptable for Xpert  Xpress SARS-CoV-2/FLU/RSV testing.  Fact Sheet for Patients: BloggerCourse.com  Fact Sheet for Healthcare Providers: SeriousBroker.it  This test is not yet approved or cleared by the [place] FDA and has been authorized for detection and/or diagnosis of SARS-CoV-2 by FDA under an Emergency Use Authorization (EUA). This EUA will remain in effect (meaning this test can be used) for the duration of the COVID-19 declaration under Section 564(b)(1) of the Act, 21 U.S.C. section 360bbb-3(b)(1), unless the authorization is terminated or revoked.     Resp Syncytial Virus by PCR NEGATIVE NEGATIVE Final    Comment: (NOTE) Fact Sheet for Patients: BloggerCourse.com  Fact Sheet for Healthcare Providers: SeriousBroker.it  This test is not yet approved or cleared by the [place] FDA and has been authorized for detection and/or diagnosis of SARS-CoV-2 by FDA under an Emergency Use Authorization (EUA). This EUA will remain in effect (meaning this test can be used) for the duration of the COVID-19 declaration under Section 564(b)(1) of the Act, 21 U.S.C. section 360bbb-3(b)(1), unless the authorization is terminated or revoked.  Performed at [facility], [address] [place], [place] [address]   MRSA Next Gen by PCR, Nasal     Status: None   Collection Time: 02/19/23  9:00 AM   Specimen: Nasal Mucosa; Nasal Swab  Result Value Ref Range Status   MRSA by PCR Next Gen NOT DETECTED NOT DETECTED Final    Comment: (NOTE) The GeneXpert MRSA Assay (FDA approved for NASAL specimens only), is one component of a comprehensive MRSA colonization surveillance program. It is not intended to diagnose MRSA infection nor to guide or monitor treatment for MRSA infections. Test performance is not FDA approved in patients less than 70 years old. Performed at [facility], [address] [place], [place] [address]   Respiratory (~20 pathogens) panel by PCR     Status: None   Collection Time: 02/19/23 10:29 AM   Specimen: Nasopharyngeal Swab; Respiratory  Result Value Ref Range Status   Adenovirus NOT DETECTED NOT DETECTED Final   Coronavirus 229E NOT DETECTED NOT DETECTED Final    Comment: (NOTE) The Coronavirus on the Respiratory Panel, DOES NOT test for the novel  Coronavirus (2019 nCoV)    Coronavirus HKU1 NOT DETECTED NOT DETECTED Final   Coronavirus NL63 NOT DETECTED NOT DETECTED Final   Coronavirus OC43 NOT DETECTED NOT DETECTED Final   Metapneumovirus NOT DETECTED NOT DETECTED Final  Rhinovirus / Enterovirus NOT DETECTED NOT DETECTED Final   Influenza A NOT DETECTED NOT DETECTED Final   Influenza B NOT DETECTED NOT DETECTED Final   Parainfluenza Virus 1 NOT DETECTED NOT DETECTED Final   Parainfluenza Virus 2 NOT DETECTED NOT DETECTED Final   Parainfluenza Virus 3 NOT DETECTED NOT DETECTED Final   Parainfluenza Virus 4 NOT DETECTED NOT DETECTED Final   Respiratory Syncytial Virus NOT DETECTED NOT DETECTED Final   Bordetella pertussis NOT DETECTED NOT DETECTED Final   Bordetella Parapertussis NOT DETECTED NOT DETECTED Final   Chlamydophila pneumoniae NOT DETECTED NOT DETECTED Final   Mycoplasma pneumoniae NOT DETECTED NOT DETECTED Final    Comment: Performed at [facility], [address] [place], [place] [address]     Time coordinating discharge: Over 30 minutes  SIGNED:   Tresa Moore, MD  [facility] 02/21/2023, 1:53 PM Pager   If 7PM-7AM, please contact night-coverage

## 2023-02-22 ENCOUNTER — Telehealth: Payer: Self-pay | Admitting: *Deleted

## 2023-02-22 LAB — CULTURE, BLOOD (ROUTINE X 2)

## 2023-02-22 NOTE — Transitions of Care (Post Inpatient/ED Visit) (Signed)
   02/22/2023  Name: Ashley Gross MRN: [id number] DOB: 1950-11-25  Today's TOC FU Call Status: Today's TOC FU Call Status:: Unsuccessul Call (1st Attempt) Unsuccessful Call (1st Attempt) Date: 02/22/23  Attempted to reach the patient regarding the most recent Inpatient/ED visit.  Follow Up Plan: Additional outreach attempts will be made to reach the patient to complete the Transitions of Care (Post Inpatient/ED visit) call.   Gean Maidens BSN RN [facility] [phone]

## 2023-02-23 ENCOUNTER — Telehealth: Payer: Self-pay | Admitting: *Deleted

## 2023-02-23 LAB — CULTURE, BLOOD (ROUTINE X 2)

## 2023-02-23 NOTE — Transitions of Care (Post Inpatient/ED Visit) (Signed)
   02/23/2023  Name: Ashley Gross MRN: [id number] DOB: [birth year]  Today's TOC FU Call Status: Today's TOC FU Call Status:: Successful TOC FU Call Competed TOC FU Call Complete Date: 02/23/23  Transition Care Management Follow-up Telephone Call Date of Discharge: 02/21/23 Discharge Facility: [facility]) Type of Discharge: Inpatient Admission Primary Inpatient Discharge Diagnosis:: sepsis How have you been since you were released from the hospital?: Better Any questions or concerns?: No  Items Reviewed: Did you receive and understand the discharge instructions provided?: Yes Medications obtained and verified?: Yes (Medications Reviewed) Any new allergies since your discharge?: No Dietary orders reviewed?: No Do you have support at home?: Yes People in Home: alone Name of Support/Comfort Primary Source: Taci  Home Care and Equipment/Supplies: Were Home Health Services Ordered?: No Any new equipment or medical supplies ordered?: No  Functional Questionnaire: Do you need assistance with bathing/showering or dressing?: No Do you need assistance with meal preparation?: No Do you need assistance with eating?: No Do you have difficulty maintaining continence: No Do you need assistance with getting out of bed/getting out of a chair/moving?: No Do you have difficulty managing or taking your medications?: No  Follow up appointments reviewed: PCP Follow-up appointment confirmed?: Yes Date of PCP follow-up appointment?: 03/02/23 Follow-up Provider: Dr Dale Provencal 11:00 Specialist [facility] Follow-up appointment confirmed?: NA Do you need transportation to your follow-up appointment?: No Do you understand care options if your condition(s) worsen?: Yes-patient verbalized understanding  SDOH Interventions Today    Flowsheet Row Most Recent Value  SDOH Interventions   Food Insecurity Interventions Intervention Not Indicated  Transportation  Interventions Intervention Not Indicated      Interventions Today    Flowsheet Row Most Recent Value  General Interventions   General Interventions Discussed/Reviewed General Interventions Discussed, General Interventions Reviewed, Doctor Visits  Doctor Visits Discussed/Reviewed Doctor Visits Discussed, Doctor Visits Reviewed  Pharmacy Interventions   Pharmacy Dicussed/Reviewed Pharmacy Topics Discussed, Pharmacy Topics Reviewed      TOC Interventions Today    Flowsheet Row Most Recent Value  TOC Interventions   TOC Interventions Discussed/Reviewed TOC Interventions Discussed, TOC Interventions Reviewed, Arranged PCP follow up less than 12 days/Care Guide scheduled       Gean Maidens BSN RN [facility] [phone]

## 2023-02-24 ENCOUNTER — Telehealth: Payer: Self-pay | Admitting: Gastroenterology

## 2023-02-24 DIAGNOSIS — K645 Perianal venous thrombosis: Secondary | ICD-10-CM

## 2023-02-24 LAB — CULTURE, BLOOD (ROUTINE X 2): Culture: NO GROWTH

## 2023-02-24 NOTE — Telephone Encounter (Signed)
Pt left message having severe hemmorriods have tried cream and ointments nothing is working she would like a call back to discuss an appointmnet in the next week if possible

## 2023-02-25 ENCOUNTER — Ambulatory Visit: Payer: PPO | Admitting: Surgery

## 2023-02-25 ENCOUNTER — Encounter: Payer: Self-pay | Admitting: Surgery

## 2023-02-25 VITALS — BP 153/79 | HR 92 | Temp 98.5°F | Ht 64.0 in | Wt 138.0 lb

## 2023-02-25 DIAGNOSIS — B3789 Other sites of candidiasis: Secondary | ICD-10-CM | POA: Diagnosis not present

## 2023-02-25 DIAGNOSIS — K6289 Other specified diseases of anus and rectum: Secondary | ICD-10-CM | POA: Diagnosis not present

## 2023-02-25 DIAGNOSIS — S30817A Abrasion of anus, initial encounter: Secondary | ICD-10-CM

## 2023-02-25 NOTE — Telephone Encounter (Signed)
Patient states she does not really want to go back to the ER. She would rather do the urgent Referral to general surgery. Placed urgent referral and called general surgery to get patient schedule. They said For patient to head that way now. Patient is going to leave her house now

## 2023-02-25 NOTE — Patient Instructions (Addendum)
Drink plenty of water. Do not use any creams. Use Recticare cream for pain.  If you have any concerns or questions, please feel free to call our office.   Hemorrhoids Hemorrhoids are swollen veins that may form: In the butt (rectum). These are called internal hemorrhoids. Around the opening of the butt (anus). These are called external hemorrhoids. Most hemorrhoids do not cause very bad problems. They often get better with changes to your lifestyle and what you eat. What are the causes? Having trouble pooping (constipation) or watery poop (diarrhea). Pushing too hard when you poop. Pregnancy. Being very overweight (obese). Sitting for too long. Riding a bike for a long time. Heavy lifting or other things that take a lot of effort. Anal sex. What are the signs or symptoms? Pain. Itching or soreness in the butt. Bleeding from the butt. Leaking poop. Swelling. One or more lumps around the opening of your butt. How is this treated? In most cases, hemorrhoids can be treated at home. You may be told to: Change what you eat. Make changes to your lifestyle. If these treatments do not help, you may need to have a procedure done. Your doctor may need to: Place rubber bands at the bottom of the hemorrhoids to make them fall off. Put medicine into the hemorrhoids to shrink them. Shine a type of light on the hemorrhoids to cause them to fall off. Do surgery to get rid of the hemorrhoids. Follow these instructions at home: Medicines Take over-the-counter and prescription medicines only as told by your doctor. Use creams with medicine in them or medicines that you put in your butt as told by your doctor. Eating and drinking  Eat foods that have a lot of fiber in them. These include whole grains, beans, nuts, fruits, and vegetables. Ask your doctor about taking products that have fiber added to them (fibersupplements). Take in less fat. You can do this by: Eating low-fat dairy  products. Eating less red meat. Staying away from processed foods. Drink enough fluid to keep your pee (urine) pale yellow. Managing pain and swelling  Take a warm-water bath (sitz bath) for 20 minutes to ease pain. Do this 3-4 times a day. You may do this in a bathtub. You may also use a portable sitz bath that fits over the toilet. If told, put ice on the painful area. It may help to use ice between your warm baths. Put ice in a plastic bag. Place a towel between your skin and the bag. Leave the ice on for 20 minutes, 2-3 times a day. If your skin turns bright red, take off the ice right away to prevent skin damage. The risk of damage is higher if you cannot feel pain, heat, or cold. General instructions Exercise. Ask your doctor how much and what kind of exercise is best for you. Go to the bathroom when you need to poop. Do not wait. Try not to push too hard when you poop. Keep your butt dry and clean. Use wet toilet paper or moist towelettes after you poop. Do not sit on the toilet for a long time. Contact a doctor if: You have pain and swelling that do not get better with treatment. You have trouble pooping. You cannot poop. You have pain or swelling outside the area of the hemorrhoids. Get help right away if: You have bleeding from the butt that will not stop. This information is not intended to replace advice given to you by your health care provider. Make  sure you discuss any questions you have with your health care provider. Document Revised: 07/01/2022 Document Reviewed: 07/01/2022 Elsevier Patient Education  2023 [employer].

## 2023-02-25 NOTE — Telephone Encounter (Signed)
Called and left a message for call back  

## 2023-02-25 NOTE — Telephone Encounter (Signed)
I am concerned if she has a thrombosed hemorrhoid leading to significant pain.  Can she go to the ER and get checked out?  If there is a clot in the hemorrhoid, they have to evacuate it.  If she does not want to go to ER, not sure if we can get her in to be seen in the surgery clinic this afternoon or tomorrow  RV

## 2023-02-25 NOTE — Addendum Note (Signed)
Addended by: Radene Knee L on: 02/25/2023 01:21 PM   Modules accepted: Orders

## 2023-02-25 NOTE — Telephone Encounter (Signed)
Patient states she has hemorrhoid problems in the past and had banding procedure. Patient states  this is the worse pain she has been with hemorrhoids. She states the pain is a aching throbbing, sharp pain. She states she had pneumonia in April and last week was in the hospital for a secondary infection. From the pneumonia she could not stop coughing. She states they gave her a cream and a suppository to use at discharge but it has not helped. She states Tuesday she cried her self to sleep for 2 hours before she went to sleep. She states she got some whips to use when she is cleaning her self. She states she can not wipe it but has to just pat her self. She states she can not put the suppository up her or the applicator for the cream because it hurts so bad. States she has been just putting the cream on the out side of her rectum. She has had rectal bleeding with bowel movements that fills the toilet and is on the wipe when she wipes.

## 2023-02-25 NOTE — Progress Notes (Signed)
Stenosis patient ID: Ashley Gross, female   DOB: [birth year], 72 y.o.   MRN: [id number]  Chief Complaint: History of hemorrhoids over 4 to 5 years.  History of Present Illness Ashley Gross is a 72 y.o. female with most recent episode of pain beginning earlier this month, specifically April 7.  She has no history of thrombosis.  She has had previous banding by Dr. Allegra Lai with GI.  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  It has been quite constant over time, she takes Imodium, and Creon.  She has had some blood in the stools essentially with wiping is bright red.  Wiping is painful she utilizes some moistened wipes.  She has not employed sitz bath's.  She had a recent hospitalization with an exacerbation of her asthma, was on antibiotics and subsequently developed perineal candidiasis for which she was treating the perineal and perianal area topically with antifungal cream/ointment.  She is also utilize Preparation H suppositories without much help as well.  She has no family history of colon cancer last colonoscopy was in September 2022.  Past Medical History Past Medical History:  Diagnosis Date   Abnormal liver function test 10/06/2012   Anemia 01/25/2022   Anemia, unspecified    Asthma    B12 deficiency 02/11/2019   Bleeding internal hemorrhoids 09/13/2017   Choledocholithiasis 06/29/2012   Formatting of this note might be different from the original. Dilated CBD on abdominal imaging 04/2012   Chronic diarrhea 02/16/2013   Colitis    Complication of anesthesia    asthma attack after shoulder surgery   Diabetes mellitus (HCC)    type II   Esophagitis    GERD (gastroesophageal reflux disease)    Heart murmur    for years- nothing to be concerned about.   History of kidney stones    Hx of arteriovenous malformation (AVM)    Hypertension    IBS (irritable bowel syndrome)    Loss of weight    Pernicious anemia    Personal history of  colonic polyps 02/10/2013   02/08/13 colonoscopy - question of rectal varices, two 3-32mm polyps in the sigmoid colon, mass at the hepatic flexure, one 5mm polyp at the hepatic flexure, nodule at the ileocecal valve, congested mucusa in the entire colon, flattened villi mucosa in the terminal ileum.     Pneumonia    Psoriatic arthritis (HCC)    s/p penicillamine, plaquenil, MTX, sulfasalazine.  s/p Embrel, Humira,.  Iritis.     Pure hypercholesterolemia    Rectal bleeding 07/15/2017   Stroke (HCC) 05/29/2022   per patient      Past Surgical History:  Procedure Laterality Date   ABDOMINAL HYSTERECTOMY  1981   ovaries left in place   ANKLE SURGERY     right   BUNIONECTOMY     CHOLECYSTECTOMY  1989   COLONOSCOPY WITH PROPOFOL N/A 12/27/2017   Procedure: COLONOSCOPY WITH PROPOFOL;  Surgeon: Toney Reil, MD;  Location: [facility] ENDOSCOPY;  Service: Gastroenterology;  Laterality: N/A;   COLONOSCOPY WITH PROPOFOL N/A 01/04/2019   Procedure: COLONOSCOPY WITH PROPOFOL;  Surgeon: Toney Reil, MD;  Location: [facility];  Service: Endoscopy;  Laterality: N/A;  Diabetic - oral and injectable   COLONOSCOPY WITH PROPOFOL N/A 07/24/2021   Procedure: COLONOSCOPY WITH PROPOFOL;  Surgeon: Toney Reil, MD;  Location: [facility]ERY [facility];  Service: Endoscopy;  Laterality: N/A;  Diabetic   ESOPHAGOGASTRODUODENOSCOPY (EGD) WITH PROPOFOL N/A 12/27/2017   Procedure:  ESOPHAGOGASTRODUODENOSCOPY (EGD) WITH PROPOFOL;  Surgeon: Toney Reil, MD;  Location: [facility] ENDOSCOPY;  Service: Gastroenterology;  Laterality: N/A;   HAND SURGERY     right   HEEL SPUR SURGERY     IR 3D INDEPENDENT WKST  10/01/2021   IR ANGIO INTRA EXTRACRAN SEL COM CAROTID INNOMINATE UNI L MOD SED  10/01/2021   IR ANGIO INTRA EXTRACRAN SEL INTERNAL CAROTID UNI R MOD SED  10/01/2021   IR ANGIO INTRA EXTRACRAN SEL INTERNAL CAROTID UNI R MOD SED  05/29/2022   IR ANGIO VERTEBRAL SEL VERTEBRAL BILAT MOD SED  10/01/2021    IR ANGIO VERTEBRAL SEL VERTEBRAL UNI R MOD SED  05/29/2022   IR CT HEAD LTD  10/01/2021   IR TRANSCATH/EMBOLIZ  10/01/2021   IR US GUIDE VASC ACCESS RIGHT  10/01/2021   IR US GUIDE VASC ACCESS RIGHT  05/29/2022   NOSE SURGERY     x2   POLYPECTOMY  01/04/2019   Procedure: POLYPECTOMY;  Surgeon: Toney Reil, MD;  Location: [facility] SUR[facility];  Service: Endoscopy;;   POLYPECTOMY N/A 07/24/2021   Procedure: POLYPECTOMY;  Surgeon: Toney Reil, MD;  Location: [facility];  Service: Endoscopy;  Laterality: N/A;   RADIOLOGY WITH ANESTHESIA N/A 10/01/2021   Procedure: Alfredia Client WITH ANESTHESIA;  Surgeon: Baldemar Lenis, MD;  Location: [facility] OR;  Service: Radiology;  Laterality: N/A;   SHOULDER ARTHROSCOPY WITH OPEN ROTATOR CUFF REPAIR Left 09/20/2018   Procedure: SHOULDER ARTHROSCOPY WITH OPEN ROTATOR CUFF REPAIR;  Surgeon: Christena Flake, MD;  Location: [facility] ORS;  Service: Orthopedics;  Laterality: Left;   SHOULDER CLOSED REDUCTION Left 12/21/2018   Procedure: MANIPULATION UNDER ANESTHESIA WITH STEROID INJECTION;  Surgeon: Christena Flake, MD;  Location: [facility] SURGERY [facility]TR;  Service: Orthopedics;  Laterality: Left;  Diabetic - insulin and oral meds   UMBILICAL HERNIA REPAIR     XI ROBOTIC ASSISTED INGUINAL HERNIA REPAIR WITH MESH Left 12/16/2021   Procedure: XI ROBOTIC ASSISTED INGUINAL HERNIA REPAIR WITH MESH;  Surgeon: [name], MD;  Location: [facility] ORS;  Service: General;  Laterality: Left;    Allergies  Allergen Reactions   Aspirin Other (See Comments)    Increased bleeding   Beta Adrenergic Blockers     3rd degree blockage    Lisinopril Cough   Mtx Support [Cobalamine Combinations] Other (See Comments)    Respiratory symptoms   Nsaids    Vasotec [Enalapril] Cough   Verapamil Other (See Comments)    Heart block   Voltaren [Diclofenac Sodium]     Pt unsure of reaction    Erythromycin Other (See Comments)    Gi intolerance   Indocin  [Indomethacin]     Upset stomach   Jardiance [Empagliflozin] Other (See Comments)    Recurrent yeast infections, even with washout and rechallenge    Current Outpatient Medications  Medication Sig Dispense Refill   acetaminophen (TYLENOL) 500 MG tablet Take 2 tablets (1,000 mg total) by mouth every 6 (six) hours as needed for mild pain.     albuterol (VENTOLIN HFA) 108 (90 Base) MCG/ACT inhaler Inhale 2 puffs into the lungs every 6 (six) hours as needed for wheezing or shortness of breath.     amLODipine (NORVASC) 5 MG tablet TAKE 1 TABLET(5 MG) BY MOUTH TWICE DAILY 180 tablet 1   benzonatate (TESSALON) 200 MG capsule Take 1 capsule (200 mg total) by mouth 3 (three) times daily as needed for cough. 20 capsule 0   budesonide-formoterol (SYMBICORT) 160-4.5 MCG/ACT  inhaler Inhale 2 puffs into the lungs 2 (two) times daily. (Patient taking differently: Inhale 2 puffs into the lungs 2 (two) times daily as needed (shortness of breath).) 3 each 3   Cholecalciferol (VITAMIN D) 50 MCG (2000 UT) tablet Take 2,000 Units by mouth daily.     CREON 36000-114000 units CPEP capsule Take 1-2 capsules (36,000-72,000 Units total) by mouth See admin instructions. Take 720000 units with each meal and 16109 units with snacks 240 capsule 3   diphenhydramine-acetaminophen (TYLENOL PM) 25-500 MG TABS tablet Take 2 tablets by mouth at bedtime as needed (sleep).     fluticasone (FLOVENT HFA) 110 MCG/ACT inhaler Inhale 2 puffs into the lungs 2 (two) times daily as needed (shortness of breath).     gabapentin (NEURONTIN) 600 MG tablet Take 600 mg by mouth daily.     guaiFENesin (MUCINEX) 600 MG 12 hr tablet Take 1 tablet (600 mg total) by mouth 2 (two) times daily for 7 days. 14 tablet 0   insulin degludec (TRESIBA FLEXTOUCH) 100 UNIT/ML FlexTouch Pen Inject 16 Units into the skin daily. 12 mL 2   ipratropium-albuterol (DUONEB) 0.5-2.5 (3) MG/3ML SOLN Take 3 mLs by nebulization every 4 (four) hours as needed. 360 mL 0    loperamide (IMODIUM) 2 MG capsule Take 4 mg by mouth daily.     losartan (COZAAR) 100 MG tablet TAKE 1 TABLET(100 MG) BY MOUTH AT BEDTIME 90 tablet 1   Melatonin 10 MG TABS Take 10 mg by mouth at bedtime.     omeprazole (PRILOSEC) 20 MG capsule TAKE 1 CAPSULE(20 MG) BY MOUTH TWICE DAILY 180 capsule 3   ONE TOUCH ULTRA TEST test strip PATIENT NEEDS NEW METER STRIPS AND LANCETS FOR ONE TOUCH. HER INSURANCE NO LONGER COVERS ACCUCHEK. 100 each PRN   predniSONE (DELTASONE) 20 MG tablet Take 1 tablet (20 mg total) by mouth daily for 5 days. 5 tablet 0   rosuvastatin (CRESTOR) 10 MG tablet TAKE 1 TABLET(10 MG) BY MOUTH DAILY 90 tablet 3   No current facility-administered medications for this visit.    Family History Family History  Problem Relation Age of Onset   Diabetes Other    Hypertension Other    Breast cancer Neg Hx    Colon cancer Neg Hx       Social History Social History   Tobacco Use   Smoking status: Never   Smokeless tobacco: Never  Vaping Use   Vaping Use: Never used  Substance Use Topics   Alcohol use: Never   Drug use: Never        Review of Systems  Constitutional: Negative.   HENT: Negative.    Eyes: Negative.   Respiratory:  Positive for cough.   Cardiovascular:  Positive for leg swelling.  Gastrointestinal:  Positive for blood in stool and diarrhea.  Genitourinary: Negative.   Skin: Negative.   Neurological: Negative.      Physical Exam Blood pressure (!) 153/79, pulse 92, temperature 98.5 F (36.9 C), temperature source Oral, height 5\' 4"  (1.626 m), weight 138 lb (62.6 kg), SpO2 98 %. Last Weight  Most recent update: 02/25/2023  2:10 PM    Weight  62.6 kg (138 lb)             CONSTITUTIONAL: Well developed, and nourished, appropriately responsive and aware without distress.   EYES: Sclera non-icteric.   EARS, NOSE, MOUTH AND THROAT:  The oropharynx is clear. Oral mucosa is pink and moist.   Hearing is intact to  voice.  NECK: Trachea is  midline, and there is no jugular venous distension.  LYMPH NODES:  Lymph nodes in the neck are not appreciated. RESPIRATORY:   Normal respiratory effort without pathologic use of accessory muscles. CARDIOVASCULAR: Heart is regular in rate and rhythm.  Well perfused.  GI: The abdomen is  soft, nontender, and nondistended.  GU: Marylene Land present as chaperone.  In an kneeling position we are able to assess the perianal area it was heavily moistened with cream/ointment remarkably tender externally primarily the perianal skin.  There were some small ulcers of the surrounding skin, minimal irregularity without prominent external hemorrhoid, hemorrhoidal inflammatory changes, or fluctuance or induration.  There is no evidence of anal fissure on exam.  She had minimal internal hemorrhoids on exam without remarkable redundancy.  Clearly no impressive piles, or significant hemorrhoids contributing to her pain.  I suspect her pain is all due to perianal skin irritation, and we proceeded with addressing this in our discussion below. MUSCULOSKELETAL:  Symmetrical muscle tone appreciated in all four extremities.    SKIN: Skin turgor is normal. No pathologic skin lesions appreciated.  NEUROLOGIC:  Motor and sensation appear grossly normal.  Cranial nerves are grossly without defect. PSYCH:  Alert and oriented to person, place and time. Affect is appropriate for situation.  Data Reviewed I have personally reviewed what is currently available of the patient's imaging, recent labs and medical records.   Labs:     Latest Ref Rng & Units 02/19/2023    5:19 AM 02/18/2023    8:57 PM 08/03/2022    3:12 PM  CBC  WBC 4.0 - 10.5 K/uL 9.4  13.3  5.9   Hemoglobin 12.0 - 15.0 g/dL 16.1  09.6  04.5   Hematocrit 36.0 - 46.0 % 31.6  32.8  35.0   Platelets 150 - 400 K/uL 255  268  221.0       Latest Ref Rng & Units 02/19/2023    5:19 AM 02/18/2023    8:57 PM 02/02/2023    3:06 PM  CMP  Glucose 70 - 99 mg/dL 409  811    BUN 8 -  23 mg/dL 11  11    Creatinine 9.14 - 1.00 mg/dL 7.82  9.56    Sodium 213 - 145 mmol/L 140  136    Potassium 3.5 - 5.1 mmol/L 3.2  3.5    Chloride 98 - 111 mmol/L 109  105    CO2 22 - 32 mmol/L 23  23    Calcium 8.9 - 10.3 mg/dL 8.0  8.0    Total Protein 6.5 - 8.1 g/dL 5.4   5.6   Total Bilirubin 0.3 - 1.2 mg/dL 0.7   0.3   Alkaline Phos 38 - 126 U/L 119   105   AST 15 - 41 U/L 27   20   ALT 0 - 44 U/L 13   15       Imaging: Radiological images reviewed:   Within last 24 hrs: No results found.  Assessment    Perianal pain. 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   Ear fullness 05/25/2021   Skin lesion 03/15/2021   Rib pain on left side 08/05/2020   Sleep difficulties 06/08/2020   Swelling of lower extremity 11/26/2019   Healthcare maintenance 09/13/2019   Asthma 12/28/2018   Vitamin D deficiency 11/01/2018   Tendinitis of upper biceps tendon of left shoulder 09/20/2018   Depression, major, single episode, mild (HCC) 09/15/2018   Mild intermittent asthma without complication 09/15/2018   Type 2 diabetes mellitus with hyperglycemia (HCC) 09/15/2018   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  She has a hand-held shower head that she can have installed in use at home.  We discussed the convenience of a bidet and there are an expensive options of a toilet seat bidet that are easily installed.  We discussed blotting dry rather than wiping after use of the bidet.  We also discussed avoiding application of creams and ointments to the perianal area, with the attempt of keeping this area as dry as possible.  I provided a sample and recommendations for application of RectiCare for acute pain control topically.  I also discussed that fiber may play a regulating affect at her bowel function and could not see why a trial of twice daily fiber supplementation could be harmful at this point in time.  She will consider this and perhaps utilize Metamucil on a regular basis and see if this improves her bowel habits.  Will see her back in a month or as needed.  Face-to-face time spent with the patient and accompanying care providers(if present) was 45 minutes, with more than 50% of the time spent counseling, educating, and coordinating care of the patient.    These notes generated with voice recognition software. I apologize for typographical errors.  Campbell Lerner M.D., FACS 02/26/2023, 11:34 AM

## 2023-02-26 DIAGNOSIS — S30817A Abrasion of anus, initial encounter: Secondary | ICD-10-CM | POA: Insufficient documentation

## 2023-03-01 ENCOUNTER — Ambulatory Visit: Payer: PPO | Admitting: Internal Medicine

## 2023-03-02 ENCOUNTER — Encounter: Payer: Self-pay | Admitting: Surgery

## 2023-03-02 ENCOUNTER — Encounter: Payer: Self-pay | Admitting: Internal Medicine

## 2023-03-02 ENCOUNTER — Ambulatory Visit: Payer: PPO | Admitting: Surgery

## 2023-03-02 ENCOUNTER — Ambulatory Visit (INDEPENDENT_AMBULATORY_CARE_PROVIDER_SITE_OTHER): Payer: PPO | Admitting: Internal Medicine

## 2023-03-02 ENCOUNTER — Telehealth: Payer: Self-pay | Admitting: Internal Medicine

## 2023-03-02 VITALS — BP 120/68 | HR 79 | Temp 97.9°F | Resp 16 | Ht 64.0 in | Wt 135.0 lb

## 2023-03-02 VITALS — BP 143/82 | HR 88 | Temp 98.2°F | Ht 64.0 in | Wt 137.0 lb

## 2023-03-02 DIAGNOSIS — E1165 Type 2 diabetes mellitus with hyperglycemia: Secondary | ICD-10-CM

## 2023-03-02 DIAGNOSIS — K51419 Inflammatory polyps of colon with unspecified complications: Secondary | ICD-10-CM | POA: Diagnosis not present

## 2023-03-02 DIAGNOSIS — E7800 Pure hypercholesterolemia, unspecified: Secondary | ICD-10-CM

## 2023-03-02 DIAGNOSIS — L4050 Arthropathic psoriasis, unspecified: Secondary | ICD-10-CM

## 2023-03-02 DIAGNOSIS — I729 Aneurysm of unspecified site: Secondary | ICD-10-CM

## 2023-03-02 DIAGNOSIS — Z794 Long term (current) use of insulin: Secondary | ICD-10-CM

## 2023-03-02 DIAGNOSIS — I671 Cerebral aneurysm, nonruptured: Secondary | ICD-10-CM | POA: Diagnosis not present

## 2023-03-02 DIAGNOSIS — S30817A Abrasion of anus, initial encounter: Secondary | ICD-10-CM

## 2023-03-02 DIAGNOSIS — F320 Major depressive disorder, single episode, mild: Secondary | ICD-10-CM

## 2023-03-02 DIAGNOSIS — D509 Iron deficiency anemia, unspecified: Secondary | ICD-10-CM | POA: Diagnosis not present

## 2023-03-02 DIAGNOSIS — K219 Gastro-esophageal reflux disease without esophagitis: Secondary | ICD-10-CM | POA: Diagnosis not present

## 2023-03-02 DIAGNOSIS — F439 Reaction to severe stress, unspecified: Secondary | ICD-10-CM

## 2023-03-02 DIAGNOSIS — Z8673 Personal history of transient ischemic attack (TIA), and cerebral infarction without residual deficits: Secondary | ICD-10-CM | POA: Diagnosis not present

## 2023-03-02 DIAGNOSIS — J4520 Mild intermittent asthma, uncomplicated: Secondary | ICD-10-CM

## 2023-03-02 DIAGNOSIS — T148XXA Other injury of unspecified body region, initial encounter: Secondary | ICD-10-CM

## 2023-03-02 DIAGNOSIS — I10 Essential (primary) hypertension: Secondary | ICD-10-CM

## 2023-03-02 LAB — IBC + FERRITIN
Ferritin: 28.2 ng/mL (ref 10.0–291.0)
Iron: 28 ug/dL — ABNORMAL LOW (ref 42–145)
Saturation Ratios: 8.6 % — ABNORMAL LOW (ref 20.0–50.0)
TIBC: 324.8 ug/dL (ref 250.0–450.0)
Transferrin: 232 mg/dL (ref 212.0–360.0)

## 2023-03-02 LAB — BASIC METABOLIC PANEL
BUN: 23 mg/dL (ref 6–23)
CO2: 29 mEq/L (ref 19–32)
Calcium: 8 mg/dL — ABNORMAL LOW (ref 8.4–10.5)
Chloride: 106 mEq/L (ref 96–112)
Creatinine, Ser: 1.03 mg/dL (ref 0.40–1.20)
GFR: 54.48 mL/min — ABNORMAL LOW (ref >60.00)
Glucose, Bld: 78 mg/dL (ref 70–99)
Potassium: 4.4 mEq/L (ref 3.5–5.1)
Sodium: 141 mEq/L (ref 135–145)

## 2023-03-02 LAB — VITAMIN B12: Vitamin B-12: 326 pg/mL (ref 211–911)

## 2023-03-02 LAB — CBC WITH DIFFERENTIAL/PLATELET
Basophils Absolute: 0 10*3/uL (ref 0.0–0.1)
Basophils Relative: 0.5 % (ref 0.0–3.0)
Eosinophils Absolute: 0.2 10*3/uL (ref 0.0–0.7)
Eosinophils Relative: 1.9 % (ref 0.0–5.0)
HCT: 34.8 % — ABNORMAL LOW (ref 36.0–46.0)
Hemoglobin: 11.3 g/dL — ABNORMAL LOW (ref 12.0–15.0)
Lymphocytes Relative: 19.6 % (ref 12.0–46.0)
Lymphs Abs: 1.8 10*3/uL (ref 0.7–4.0)
MCHC: 32.6 g/dL (ref 30.0–36.0)
MCV: 86.7 fl (ref 78.0–100.0)
Monocytes Absolute: 0.7 10*3/uL (ref 0.1–1.0)
Monocytes Relative: 7 % (ref 3.0–12.0)
Neutro Abs: 6.7 10*3/uL (ref 1.4–7.7)
Neutrophils Relative %: 71 % (ref 43.0–77.0)
Platelets: 244 10*3/uL (ref 150.0–400.0)
RBC: 4.01 Mil/uL (ref 3.87–5.11)
RDW: 14.9 % (ref 11.5–15.5)
WBC: 9.4 10*3/uL (ref 4.0–10.5)

## 2023-03-02 NOTE — Telephone Encounter (Signed)
Pt called in to let Dr.Scott knows that she went to see Dr. Campbell Lerner for an infection today and he did nothing for her. As per pt, he did not prescribed her nothing for treatment, that it was a waste of time and money. Pt will not go back to see them again.

## 2023-03-02 NOTE — Progress Notes (Signed)
Subjective:    Patient ID: Ashley Gross, female    DOB: 02/12/1951, 72 y.o.   MRN: [id number]  Patient here for  Chief Complaint  Patient presents with   Hospitalization Follow-up    HPI Here for hospital follow up - hospitalized - 02/18/23 - 02/21/23. Presented with cough and sob.  Chest x-ray negative. COVID-negative. Flu negative. RSV negative. Procalcitonin negative.  Respiratory viral panel negative.  Had been treated a couple of weeks earlier for pneumonia (with levaquin).  Sepsis ruled out.  Was having significant wheezing.  Was treated for asthma exacerbation.  IV steroids.  Prn bronchodilators.  Discharged to continue home bronchodilator regimen and prn duonebs.  Short course of po prednisone.  Since being home, breathing is better.  She is making herself eat.  No chest pain.  No increased sob.  No increased cough.  She does report that when she was having the increased cough, noticed problems with hemorrhoid.  Subsequently developed increased pain with bm.  Was given a cream and suppository at discharge, but due to increased pain - unable to use suppository.  Called Dr Verdis Prime office due to the above pain and bleeding with bm.  Referred to surgery.  Wiping - painful. Evaluated by surgery and diagnosed with perianal excoriation.  No thrombosed hemorrhoid.  Was instructed on perianal hygiene - for example blotting dry rather than wiping, using shower head to clean, etc.  She comes in today with continued peri anal pain.  Has been doing all of the above.  Still with irritation and pain.  Has f/u today with surgery to discuss.  Following sugars.  On tresiba.     Past Medical History:  Diagnosis Date   Abnormal liver function test 10/06/2012   Anemia 01/25/2022   Anemia, unspecified    Asthma    B12 deficiency 02/11/2019   Bleeding internal hemorrhoids 09/13/2017   Choledocholithiasis 06/29/2012   Formatting of this note might be different from the original. Dilated CBD on abdominal  imaging 04/2012   Chronic diarrhea 02/16/2013   Colitis    Complication of anesthesia    asthma attack after shoulder surgery   Diabetes mellitus (HCC)    type II   Esophagitis    GERD (gastroesophageal reflux disease)    Heart murmur    for years- nothing to be concerned about.   History of kidney stones    Hx of arteriovenous malformation (AVM)    Hypertension    IBS (irritable bowel syndrome)    Loss of weight    Pernicious anemia    Personal history of colonic polyps 02/10/2013   02/08/13 colonoscopy - question of rectal varices, two 3-35mm polyps in the sigmoid colon, mass at the hepatic flexure, one 5mm polyp at the hepatic flexure, nodule at the ileocecal valve, congested mucusa in the entire colon, flattened villi mucosa in the terminal ileum.     Pneumonia    Psoriatic arthritis (HCC)    s/p penicillamine, plaquenil, MTX, sulfasalazine.  s/p Embrel, Humira,.  Iritis.     Pure hypercholesterolemia    Rectal bleeding 07/15/2017   Stroke (HCC) 05/29/2022   per patient   Past Surgical History:  Procedure Laterality Date   ABDOMINAL HYSTERECTOMY  1981   ovaries left in place   ANKLE SURGERY     right   BUNIONECTOMY     CHOLECYSTECTOMY  1989   COLONOSCOPY WITH PROPOFOL N/A 12/27/2017   Procedure: COLONOSCOPY WITH PROPOFOL;  Surgeon: Toney Reil, MD;  Location: [facility] ENDOSCOPY;  Service: Gastroenterology;  Laterality: N/A;   COLONOSCOPY WITH PROPOFOL N/A 01/04/2019   Procedure: COLONOSCOPY WITH PROPOFOL;  Surgeon: Toney Reil, MD;  Location: [facility];  Service: Endoscopy;  Laterality: N/A;  Diabetic - oral and injectable   COLONOSCOPY WITH PROPOFOL N/A 07/24/2021   Procedure: COLONOSCOPY WITH PROPOFOL;  Surgeon: Toney Reil, MD;  Location: [facility]ERY [facility];  Service: Endoscopy;  Laterality: N/A;  Diabetic   ESOPHAGOGASTRODUODENOSCOPY (EGD) WITH PROPOFOL N/A 12/27/2017   Procedure: ESOPHAGOGASTRODUODENOSCOPY (EGD) WITH PROPOFOL;  Surgeon: Toney Reil, MD;  Location: [facility] ENDOSCOPY;  Service: Gastroenterology;  Laterality: N/A;   HAND SURGERY     right   HEEL SPUR SURGERY     IR 3D INDEPENDENT WKST  10/01/2021   IR ANGIO INTRA EXTRACRAN SEL COM CAROTID INNOMINATE UNI L MOD SED  10/01/2021   IR ANGIO INTRA EXTRACRAN SEL INTERNAL CAROTID UNI R MOD SED  10/01/2021   IR ANGIO INTRA EXTRACRAN SEL INTERNAL CAROTID UNI R MOD SED  05/29/2022   IR ANGIO VERTEBRAL SEL VERTEBRAL BILAT MOD SED  10/01/2021   IR ANGIO VERTEBRAL SEL VERTEBRAL UNI R MOD SED  05/29/2022   IR CT HEAD LTD  10/01/2021   IR TRANSCATH/EMBOLIZ  10/01/2021   IR US GUIDE VASC ACCESS RIGHT  10/01/2021   IR US GUIDE VASC ACCESS RIGHT  05/29/2022   NOSE SURGERY     x2   POLYPECTOMY  01/04/2019   Procedure: POLYPECTOMY;  Surgeon: Toney Reil, MD;  Location: [facility];  Service: Endoscopy;;   POLYPECTOMY N/A 07/24/2021   Procedure: POLYPECTOMY;  Surgeon: Toney Reil, MD;  Location: [facility];  Service: Endoscopy;  Laterality: N/A;   RADIOLOGY WITH ANESTHESIA N/A 10/01/2021   Procedure: Alfredia Client WITH ANESTHESIA;  Surgeon: Baldemar Lenis, MD;  Location: [facility] OR;  Service: Radiology;  Laterality: N/A;   SHOULDER ARTHROSCOPY WITH OPEN ROTATOR CUFF REPAIR Left 09/20/2018   Procedure: SHOULDER ARTHROSCOPY WITH OPEN ROTATOR CUFF REPAIR;  Surgeon: Christena Flake, MD;  Location: [facility] ORS;  Service: Orthopedics;  Laterality: Left;   SHOULDER CLOSED REDUCTION Left 12/21/2018   Procedure: MANIPULATION UNDER ANESTHESIA WITH STEROID INJECTION;  Surgeon: Christena Flake, MD;  Location: [facility];  Service: Orthopedics;  Laterality: Left;  Diabetic - insulin and oral meds   UMBILICAL HERNIA REPAIR     XI ROBOTIC ASSISTED INGUINAL HERNIA REPAIR WITH MESH Left 12/16/2021   Procedure: XI ROBOTIC ASSISTED INGUINAL HERNIA REPAIR WITH MESH;  Surgeon: [name], MD;  Location: [facility] ORS;  Service: General;  Laterality: Left;   Family  History  Problem Relation Age of Onset   Diabetes Other    Hypertension Other    Breast cancer Neg Hx    Colon cancer Neg Hx    Social History   Socioeconomic History   Marital status: Widowed    Spouse name: Not on file   Number of children: 2   Years of education: Not on file   Highest education level: Not on file  Occupational History   Not on file  Tobacco Use   Smoking status: Never    Passive exposure: Never   Smokeless tobacco: Never  Vaping Use   Vaping Use: Never used  Substance and Sexual Activity   Alcohol use: Never   Drug use: Never   Sexual activity: Not Currently  Other Topics Concern   Not on file  Social History Narrative   She is  married and has two children   Grand daughter lives with her.   Social Determinants of Health   Financial Resource Strain: Low Risk  (05/13/2022)   Overall Financial Resource Strain (CARDIA)    Difficulty of Paying Living Expenses: Not hard at all  Food Insecurity: No Food Insecurity (02/23/2023)   Hunger Vital Sign    Worried About Running Out of Food in the Last Year: Never true    Ran Out of Food in the Last Year: Never true  Transportation Needs: No Transportation Needs (02/23/2023)   PRAPARE - Administrator, Civil Service (Medical): No    Lack of Transportation (Non-Medical): No  Physical Activity: Not on file  Stress: No Stress Concern Present (05/13/2022)   [employer] of Occupational Health - Occupational Stress Questionnaire    Feeling of Stress : Not at all  Social Connections: Unknown (01/07/2021)   Social Connection and Isolation Panel [NHANES]    Frequency of Communication with Friends and Family: More than three times a week    Frequency of Social Gatherings with Friends and Family: Not on file    Attends Religious Services: Not on file    Active Member of Clubs or Organizations: Not on file    Attends Banker Meetings: Not on file    Marital Status: Not on file     Review  of Systems  Constitutional:  Negative for fever and unexpected weight change.  HENT:  Negative for congestion and sinus pressure.   Respiratory:  Negative for chest tightness.        No increased cough or congestion.  Breathing better.   Cardiovascular:  Negative for chest pain and palpitations.  Gastrointestinal:  Negative for nausea and vomiting.       Peri rectal pain as outlined.   Genitourinary:  Negative for difficulty urinating and dysuria.  Musculoskeletal:  Negative for joint swelling and myalgias.  Skin:        Peri rectal irritation as outlined.   Neurological:  Negative for dizziness and headaches.  Psychiatric/Behavioral:  Negative for agitation and dysphoric mood.        Increased stress with current medical issues.        Objective:     BP 120/68   Pulse 79   Temp 97.9 F (36.6 C)   Resp 16   Ht 5\' 4"  (1.626 m)   Wt 135 lb (61.2 kg)   SpO2 98%   BMI 23.17 kg/m  Wt Readings from Last 3 Encounters:  03/02/23 137 lb (62.1 kg)  03/02/23 135 lb (61.2 kg)  02/25/23 138 lb (62.6 kg)    Physical Exam Vitals reviewed.  Constitutional:      General: She is not in acute distress.    Appearance: Normal appearance.  HENT:     Head: Normocephalic and atraumatic.     Right Ear: External ear normal.     Left Ear: External ear normal.  Eyes:     General: No scleral icterus.       Right eye: No discharge.        Left eye: No discharge.     Conjunctiva/sclera: Conjunctivae normal.  Neck:     Thyroid: No thyromegaly.  Cardiovascular:     Rate and Rhythm: Normal rate and regular rhythm.  Pulmonary:     Effort: No respiratory distress.     Breath sounds: Normal breath sounds. No wheezing.  Abdominal:     General: Bowel sounds are normal.  Palpations: Abdomen is soft.     Tenderness: There is no abdominal tenderness.  Genitourinary:    Comments: Peri rectal exam - increased erythema and drainage.  Excoriated areas. No evidence of thrombosed hemorrhoid.    Musculoskeletal:        General: No swelling or tenderness.     Cervical back: Neck supple. No tenderness.  Lymphadenopathy:     Cervical: No cervical adenopathy.  Skin:    Findings: No erythema or rash.  Neurological:     Mental Status: She is alert.  Psychiatric:        Mood and Affect: Mood normal.        Behavior: Behavior normal.      Outpatient Encounter Medications as of 03/02/2023  Medication Sig   acetaminophen (TYLENOL) 500 MG tablet Take 2 tablets (1,000 mg total) by mouth every 6 (six) hours as needed for mild pain.   albuterol (VENTOLIN HFA) 108 (90 Base) MCG/ACT inhaler Inhale 2 puffs into the lungs every 6 (six) hours as needed for wheezing or shortness of breath.   amLODipine (NORVASC) 5 MG tablet TAKE 1 TABLET(5 MG) BY MOUTH TWICE DAILY   benzonatate (TESSALON) 200 MG capsule Take 1 capsule (200 mg total) by mouth 3 (three) times daily as needed for cough.   budesonide-formoterol (SYMBICORT) 160-4.5 MCG/ACT inhaler Inhale 2 puffs into the lungs 2 (two) times daily. (Patient taking differently: Inhale 2 puffs into the lungs 2 (two) times daily as needed (shortness of breath).)   Cholecalciferol (VITAMIN D) 50 MCG (2000 UT) tablet Take 2,000 Units by mouth daily.   CREON 36000-114000 units CPEP capsule Take 1-2 capsules (36,000-72,000 Units total) by mouth See admin instructions. Take 720000 units with each meal and 33295 units with snacks   diphenhydramine-acetaminophen (TYLENOL PM) 25-500 MG TABS tablet Take 2 tablets by mouth at bedtime as needed (sleep).   fluticasone (FLOVENT HFA) 110 MCG/ACT inhaler Inhale 2 puffs into the lungs 2 (two) times daily as needed (shortness of breath).   gabapentin (NEURONTIN) 600 MG tablet Take 600 mg by mouth daily.   insulin degludec (TRESIBA FLEXTOUCH) 100 UNIT/ML FlexTouch Pen Inject 16 Units into the skin daily.   ipratropium-albuterol (DUONEB) 0.5-2.5 (3) MG/3ML SOLN Take 3 mLs by nebulization every 4 (four) hours as needed.    loperamide (IMODIUM) 2 MG capsule Take 4 mg by mouth daily.   losartan (COZAAR) 100 MG tablet TAKE 1 TABLET(100 MG) BY MOUTH AT BEDTIME   Melatonin 10 MG TABS Take 10 mg by mouth at bedtime.   omeprazole (PRILOSEC) 20 MG capsule TAKE 1 CAPSULE(20 MG) BY MOUTH TWICE DAILY   ONE TOUCH ULTRA TEST test strip PATIENT NEEDS NEW METER STRIPS AND LANCETS FOR ONE TOUCH. HER INSURANCE NO LONGER COVERS ACCUCHEK.   rosuvastatin (CRESTOR) 10 MG tablet TAKE 1 TABLET(10 MG) BY MOUTH DAILY   No facility-administered encounter medications on file as of 03/02/2023.    Reviewed and reconciled medications with pt.   Lab Results  Component Value Date   WBC 9.4 03/02/2023   HGB 11.3 (L) 03/02/2023   HCT 34.8 (L) 03/02/2023   PLT 244.0 03/02/2023   GLUCOSE 78 03/02/2023   CHOL 127 01/22/2023   TRIG 106.0 01/22/2023   HDL 55.80 01/22/2023   LDLDIRECT 74.0 04/19/2019   LDLCALC 50 01/22/2023   ALT 13 02/19/2023   AST 27 02/19/2023   NA 141 03/02/2023   K 4.4 03/02/2023   CL 106 03/02/2023   CREATININE 1.03 03/02/2023   BUN  23 03/02/2023   CO2 29 03/02/2023   TSH 2.021 07/02/2022   INR 1.1 02/19/2023   HGBA1C 6.4 01/22/2023   MICROALBUR 1.4 04/16/2022    DG Chest 2 View  Result Date: 02/18/2023 CLINICAL DATA:  Cough EXAM: CHEST - 2 VIEW COMPARISON:  None Available. FINDINGS: Lungs are clear.  No pleural effusion or pneumothorax. The heart is normal in size. Visualized osseous structures are within normal limits. IMPRESSION: Normal chest radiographs. Electronically Signed   By: Charline Bills M.D.   On: 02/18/2023 21:15       Assessment & Plan:  Iron deficiency anemia, unspecified iron deficiency anemia type Assessment & Plan: Seeing GI.  entyvio treatments for inflammatory polyposis.  Seeing hematology - iron infusions.  Follow.   Orders: -     CBC with Differential/Platelet -     Vitamin B12 -     IBC + Ferritin  Primary hypertension Assessment & Plan: On amlodipine and losartan.   Follow pressures.  Follow metabolic panel.   Orders: -     Basic metabolic panel  Aneurysm [facility]) Assessment & Plan: Saw Dr Myer Haff [facility] - 01/2023 - At this point, she is asymptomatic. He recommended follow-up imaging. His office will arrange for MRI scan of the brain as well as MR angiogram of the brain in the summer. He will touch base with her via telephone after that is performed. If her findings are stable, she will likely follow with yearly imaging.    Aneurysm of anterior communicating artery Assessment & Plan: Saw Dr Myer Haff [facility] - 01/2023 - At this point, she is asymptomatic. He recommended follow-up imaging. His office will arrange for MRI scan of the brain as well as MR angiogram of the brain in the summer. He will touch base with her via telephone after that is performed. If her findings are stable, she will likely follow with yearly imaging.    Mild intermittent asthma without complication Assessment & Plan: Breathing better.  No increased cough and congestion.  Off prednisone and abx.  Continue symbicort.  Has duonebs if needed.    Depression, major, single episode, mild (HCC) Assessment & Plan: Discussed.  Overall appears to be handling things relatively well.  Will notify if feels needs further intervention.  Follow.    Gastroesophageal reflux disease, unspecified whether esophagitis present Assessment & Plan: Continue omeprazole. No increased acid reflux.     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  MRA head and neck were negative.  ECHO - no evidence of embolic source. With history of significant GI bleed in the past due to AVM, polyp bleed.     Hypercholesterolemia Assessment & Plan: Continue crestor. Follow lipid panel and liver function tests.     Pseudopolyposis of colon with complication, unspecified part of colon  [facility]) Assessment & Plan: S/p colonoscopy 07/24/21 as outlined.  Continues f/u with GI.  Started entyvio. Keep f/u with GI.    Perianal excoriation Assessment & Plan: Increased pain and irritation as outlined.  Exam as outlined.  Keep area as dry as possible.  Seeing surgery today.     Psoriatic arthritis (HCC) Assessment & Plan: Stable.  Has been followed by rheumatology.     Type 2 diabetes mellitus with hyperglycemia, with long-term current use of insulin (HCC) Assessment & Plan: On insulin.  Follow sugars. Follow met b and A1c. Low carb diet and exercise.    Stress  Assessment & Plan: Increased stress with her underlying medical issues. Has good support.  Follow.       Dale Tarrant, MD

## 2023-03-02 NOTE — Telephone Encounter (Signed)
FYI

## 2023-03-02 NOTE — Progress Notes (Unsigned)
Patient recently seen last week, presents today reporting that she had utilized all the advice given, cleansing perianal area with hand-held showerhead, and applying RectiCare topically for pain.  She reports the RectiCare only provided 10 to 15 minutes of pain relief.  She reports she has concerns about green soilage over the weekend.  Felt that this had to be secondary to some form of infection.  She has done sitz bath's which were not advised.  Bowel movements remain painful. On examination today there are excoriated areas around the perianal skin, some with a yellowish exudate.  I do not appreciate any green drainage consistent with the mucus color that the patient described.  She remains exquisitely tender.  And this is all basically just contacting the external perianal skin.  Once again it was noted to be quite moist and I believe this is secondary to the RectiCare that she is applied earlier today.  This is primarily a dermal ulcerations, that I do not believe are going to improve unless she can maintain a dryness to this perianal area.  I do not believe topical creams or medications will be helpful and likely just keep her skin from healing over these excoriations.  She is quite frustrated as I can understand it is quite painful, unfortunately from a surgical perspective I have nothing that will improve her symptoms.  I do not believe she wants oral pain medication.  I strongly advised that she limit her exposure to water but just enough to cleanse the area adequately.  To explain the green discharge she noted, I suspect there may be a degree of mucousy seepage that is unavoidable considering the region.  I do not appreciate that any of this is related to painful hemorrhoids, nor of the skin tags responsible.  However the irregularity of this area may encourage some degree of inadvertent mucousy soilage.  We talked about other efforts to try to control her bowel habits better and limit the amount of  traffic.  She may benefit from evaluation by dermatology, I will be glad to see her again in a month but this is could not be a test of patients in terms of getting her better.

## 2023-03-02 NOTE — Patient Instructions (Addendum)
On today's exam we saw skin excoriation in your perianal area.   Try your best to keep your skin area dry. You may need to change a dry pad several times a day to do this.  Continue to shower after having a bowel movement and dry thoroughly. You may try using a blow dryer.   It may take a few weeks for the skin in the area to grow back and cover this area with healthy tissue.   Let us know if you start to develop any fevers or chills, spreading redness, or sudden increased pain in the area.

## 2023-03-03 ENCOUNTER — Telehealth: Payer: Self-pay

## 2023-03-03 DIAGNOSIS — I10 Essential (primary) hypertension: Secondary | ICD-10-CM

## 2023-03-03 NOTE — Telephone Encounter (Signed)
LMTCB. When she returns call- ok to give results and let her know about surgeon recommendations and urgent dermatology referral has been placed

## 2023-03-03 NOTE — Telephone Encounter (Signed)
Urgent derm referral placed. Will notify patient of below when I call her with labs.

## 2023-03-03 NOTE — Addendum Note (Signed)
Addended by: Rita Ohara D on: 03/03/2023 12:15 PM   Modules accepted: Orders

## 2023-03-03 NOTE — Telephone Encounter (Signed)
Dx - skin excoriation.  In comments - pt with increased dermal ulcerations - perianal region.  Also regarding recommendations the surgeon recommended - "Try your best to keep your skin area dry. You may need to change a dry pad several times a day to do this.  Continue to shower after having a bowel movement and dry thoroughly. You may try using a blow dryer. "

## 2023-03-03 NOTE — Telephone Encounter (Signed)
-----   Message from Dale St[place], MD sent at 03/03/2023  4:59 AM EDT ----- Notify - Ashley Gross that her kidney function is decreased some from last check need to make sure staying hydrated.  We will need to follow.  Also avoid antiinflammatories.  Recheck met b in 10 days.  Hgb improved from last check.  Please confirm if taking any iron and if so, document how taking. B12 level - improved and in normal range.  Also seen phone note.

## 2023-03-03 NOTE — Telephone Encounter (Signed)
Please call her and let her know that he sent me a message and recommended a dermatology referral given the lesions noted on the skin.  Also recommended keeping the area dry and trying to limit the amount of water/moisture - contact to that area.  If agreeable to dermatology referral, can place urgent referral.  See if has preference of which dermatologist she prefers to see.

## 2023-03-03 NOTE — Telephone Encounter (Signed)
Pt agreeable to dermatology referral- has seen Dr Cheree Ditto in the past. I can place referral just need diagnosis code/reason for referral.

## 2023-03-04 NOTE — Telephone Encounter (Signed)
LMTCB

## 2023-03-05 ENCOUNTER — Telehealth: Payer: Self-pay | Admitting: Internal Medicine

## 2023-03-05 NOTE — Addendum Note (Signed)
Addended by: Rita Ohara D on: 03/05/2023 02:21 PM   Modules accepted: Orders

## 2023-03-05 NOTE — Telephone Encounter (Signed)
Met b ordered.  

## 2023-03-05 NOTE — Telephone Encounter (Signed)
Pt have an appt with Dr.Scott on 5/13, 10 days from today. Pt will repeat met b on the same day.

## 2023-03-05 NOTE — Telephone Encounter (Signed)
See other message

## 2023-03-05 NOTE — Telephone Encounter (Signed)
Pt returned [place] LPN call. Note below were read to pt. Pt aware and understood. As per pt, she's not taking any iron at the moment.

## 2023-03-05 NOTE — Telephone Encounter (Signed)
Noted  

## 2023-03-05 NOTE — Telephone Encounter (Signed)
LMTCB

## 2023-03-08 ENCOUNTER — Encounter: Payer: Self-pay | Admitting: Internal Medicine

## 2023-03-08 NOTE — Assessment & Plan Note (Signed)
Continue crestor. Follow lipid panel and liver function tests.   

## 2023-03-08 NOTE — Assessment & Plan Note (Signed)
On insulin.  Follow sugars. Follow met b and A1c. Low carb diet and exercise.  

## 2023-03-08 NOTE — Assessment & Plan Note (Signed)
Saw Dr Yarbrough [facility] - 01/2023 - At this point, she is asymptomatic. He recommended follow-up imaging. His office will arrange for MRI scan of the brain as well as MR angiogram of the brain in the summer. He will touch base with her via telephone after that is performed. If her findings are stable, she will likely follow with yearly imaging.  

## 2023-03-08 NOTE — Assessment & Plan Note (Signed)
S/p colonoscopy 07/24/21 as outlined.  Continues f/u with GI.  Started entyvio. Keep f/u with GI.

## 2023-03-08 NOTE — Assessment & Plan Note (Signed)
Increased pain and irritation as outlined.  Exam as outlined.  Keep area as dry as possible.  Seeing surgery today.

## 2023-03-08 NOTE — Assessment & Plan Note (Signed)
Breathing better.  No increased cough and congestion.  Off prednisone and abx.  Continue symbicort.  Has duonebs if needed.

## 2023-03-08 NOTE — Assessment & Plan Note (Signed)
Discussed.  Overall appears to be handling things relatively well.  Will notify if feels needs further intervention.  Follow.  

## 2023-03-08 NOTE — Assessment & Plan Note (Signed)
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  MRA head and neck were negative.  ECHO - no evidence of embolic source. With history of significant GI bleed in the past due to AVM, polyp bleed.   

## 2023-03-08 NOTE — Assessment & Plan Note (Signed)
Stable.  Has been followed by rheumatology.   

## 2023-03-08 NOTE — Assessment & Plan Note (Signed)
Seeing GI.  entyvio treatments for inflammatory polyposis.  Seeing hematology - iron infusions.  Follow.  

## 2023-03-08 NOTE — Assessment & Plan Note (Signed)
Increased stress with her underlying medical issues. Has good support.  Follow.

## 2023-03-08 NOTE — Assessment & Plan Note (Signed)
On amlodipine and losartan.  Follow pressures.  Follow metabolic panel.  

## 2023-03-08 NOTE — Assessment & Plan Note (Signed)
Continue omeprazole. No increased acid reflux.   

## 2023-03-09 ENCOUNTER — Telehealth: Payer: Self-pay

## 2023-03-09 NOTE — Telephone Encounter (Signed)
Patient states Dr. Dale [place] referred her to a dermatologist, but the earliest Dr. Ebony Cargo will be able to see her is 06/15/2023.  Patient states Dr. Allegra Lai will be able to see her 06/21/2023.  Patient states she would like to know if Dr. Lorin Picket can refer her to a proctologist because the pain is too much for her to endure until August.

## 2023-03-10 NOTE — Telephone Encounter (Signed)
Appt with dermatology moved up to 5/14. Patient is aware.

## 2023-03-15 ENCOUNTER — Encounter: Payer: Self-pay | Admitting: Internal Medicine

## 2023-03-15 ENCOUNTER — Ambulatory Visit (INDEPENDENT_AMBULATORY_CARE_PROVIDER_SITE_OTHER): Payer: PPO | Admitting: Internal Medicine

## 2023-03-15 VITALS — BP 126/70 | HR 80 | Temp 97.9°F | Resp 16 | Ht 64.0 in | Wt 139.8 lb

## 2023-03-15 DIAGNOSIS — S30817A Abrasion of anus, initial encounter: Secondary | ICD-10-CM

## 2023-03-15 DIAGNOSIS — I729 Aneurysm of unspecified site: Secondary | ICD-10-CM | POA: Diagnosis not present

## 2023-03-15 DIAGNOSIS — K51419 Inflammatory polyps of colon with unspecified complications: Secondary | ICD-10-CM

## 2023-03-15 DIAGNOSIS — K219 Gastro-esophageal reflux disease without esophagitis: Secondary | ICD-10-CM | POA: Diagnosis not present

## 2023-03-15 DIAGNOSIS — E7800 Pure hypercholesterolemia, unspecified: Secondary | ICD-10-CM | POA: Diagnosis not present

## 2023-03-15 DIAGNOSIS — I10 Essential (primary) hypertension: Secondary | ICD-10-CM

## 2023-03-15 DIAGNOSIS — F320 Major depressive disorder, single episode, mild: Secondary | ICD-10-CM

## 2023-03-15 DIAGNOSIS — D509 Iron deficiency anemia, unspecified: Secondary | ICD-10-CM

## 2023-03-15 DIAGNOSIS — R944 Abnormal results of kidney function studies: Secondary | ICD-10-CM

## 2023-03-15 DIAGNOSIS — M7989 Other specified soft tissue disorders: Secondary | ICD-10-CM

## 2023-03-15 DIAGNOSIS — E237 Disorder of pituitary gland, unspecified: Secondary | ICD-10-CM

## 2023-03-15 DIAGNOSIS — Z8673 Personal history of transient ischemic attack (TIA), and cerebral infarction without residual deficits: Secondary | ICD-10-CM | POA: Diagnosis not present

## 2023-03-15 DIAGNOSIS — F439 Reaction to severe stress, unspecified: Secondary | ICD-10-CM

## 2023-03-15 DIAGNOSIS — D329 Benign neoplasm of meninges, unspecified: Secondary | ICD-10-CM

## 2023-03-15 DIAGNOSIS — L4050 Arthropathic psoriasis, unspecified: Secondary | ICD-10-CM

## 2023-03-15 DIAGNOSIS — Z794 Long term (current) use of insulin: Secondary | ICD-10-CM

## 2023-03-15 DIAGNOSIS — I671 Cerebral aneurysm, nonruptured: Secondary | ICD-10-CM

## 2023-03-15 DIAGNOSIS — J4520 Mild intermittent asthma, uncomplicated: Secondary | ICD-10-CM | POA: Diagnosis not present

## 2023-03-15 DIAGNOSIS — E1165 Type 2 diabetes mellitus with hyperglycemia: Secondary | ICD-10-CM

## 2023-03-15 MED ORDER — ROSUVASTATIN CALCIUM 10 MG PO TABS: ORAL_TABLET | ORAL | 3 refills | Status: DC

## 2023-03-15 MED ORDER — LOSARTAN POTASSIUM 100 MG PO TABS: ORAL_TABLET | ORAL | 1 refills | Status: DC

## 2023-03-15 MED ORDER — AMLODIPINE BESYLATE 5 MG PO TABS: ORAL_TABLET | ORAL | 1 refills | Status: DC

## 2023-03-15 MED ORDER — OMEPRAZOLE 20 MG PO CPDR: DELAYED_RELEASE_CAPSULE | ORAL | 3 refills | Status: DC

## 2023-03-15 NOTE — Progress Notes (Signed)
Subjective:    Patient ID: Ashley Gross, female    DOB: 09-Nov-1950, 72 y.o.   MRN: [id number]  Patient here for  Chief Complaint  Patient presents with   Medical Management of Chronic Issues    HPI Here for a scheduled follow up.  Here to follow up regarding diabetes and hypertension.  Has also been having problems with perianal excoriation.  Saw surgery.  Trying to keep the area dry.  Due to see dermatology this week.  Bowels moving.  No chest pain or sob reported.  No vomiting.  No abdominal pain.  Bowels relatively stable - on entyvio.  Followed by GI.  Blood sugar this am 59.  Mostly averaging 120-130s.  Handling stress.    Past Medical History:  Diagnosis Date   Abnormal liver function test 10/06/2012   Anemia 01/25/2022   Anemia, unspecified    Asthma    B12 deficiency 02/11/2019   Bleeding internal hemorrhoids 09/13/2017   Choledocholithiasis 06/29/2012   Formatting of this note might be different from the original. Dilated CBD on abdominal imaging 04/2012   Chronic diarrhea 02/16/2013   Colitis    Complication of anesthesia    asthma attack after shoulder surgery   Diabetes mellitus (HCC)    type II   Esophagitis    GERD (gastroesophageal reflux disease)    Heart murmur    for years- nothing to be concerned about.   History of kidney stones    Hx of arteriovenous malformation (AVM)    Hypertension    IBS (irritable bowel syndrome)    Loss of weight    Pernicious anemia    Personal history of colonic polyps 02/10/2013   02/08/13 colonoscopy - question of rectal varices, two 3-66mm polyps in the sigmoid colon, mass at the hepatic flexure, one 5mm polyp at the hepatic flexure, nodule at the ileocecal valve, congested mucusa in the entire colon, flattened villi mucosa in the terminal ileum.     Pneumonia    Psoriatic arthritis (HCC)    s/p penicillamine, plaquenil, MTX, sulfasalazine.  s/p Embrel, Humira,.  Iritis.     Pure hypercholesterolemia    Rectal bleeding  07/15/2017   Stroke (HCC) 05/29/2022   per patient   Past Surgical History:  Procedure Laterality Date   ABDOMINAL HYSTERECTOMY  1981   ovaries left in place   ANKLE SURGERY     right   BUNIONECTOMY     CHOLECYSTECTOMY  1989   COLONOSCOPY WITH PROPOFOL N/A 12/27/2017   Procedure: COLONOSCOPY WITH PROPOFOL;  Surgeon: Toney Reil, MD;  Location: [facility] ENDOSCOPY;  Service: Gastroenterology;  Laterality: N/A;   COLONOSCOPY WITH PROPOFOL N/A 01/04/2019   Procedure: COLONOSCOPY WITH PROPOFOL;  Surgeon: Toney Reil, MD;  Location: [facility] SURGERY CNTR;  Service: Endoscopy;  Laterality: N/A;  Diabetic - oral and injectable   COLONOSCOPY WITH PROPOFOL N/A 07/24/2021   Procedure: COLONOSCOPY WITH PROPOFOL;  Surgeon: Toney Reil, MD;  Location: [facility] SURGERY CNTR;  Service: Endoscopy;  Laterality: N/A;  Diabetic   ESOPHAGOGASTRODUODENOSCOPY (EGD) WITH PROPOFOL N/A 12/27/2017   Procedure: ESOPHAGOGASTRODUODENOSCOPY (EGD) WITH PROPOFOL;  Surgeon: Toney Reil, MD;  Location: [facility] ENDOSCOPY;  Service: Gastroenterology;  Laterality: N/A;   HAND SURGERY     right   HEEL SPUR SURGERY     IR 3D INDEPENDENT WKST  10/01/2021   IR ANGIO INTRA EXTRACRAN SEL COM CAROTID INNOMINATE UNI L MOD SED  10/01/2021   IR ANGIO INTRA EXTRACRAN SEL INTERNAL CAROTID  UNI R MOD SED  10/01/2021   IR ANGIO INTRA EXTRACRAN SEL INTERNAL CAROTID UNI R MOD SED  05/29/2022   IR ANGIO VERTEBRAL SEL VERTEBRAL BILAT MOD SED  10/01/2021   IR ANGIO VERTEBRAL SEL VERTEBRAL UNI R MOD SED  05/29/2022   IR CT HEAD LTD  10/01/2021   IR TRANSCATH/EMBOLIZ  10/01/2021   IR US GUIDE VASC ACCESS RIGHT  10/01/2021   IR US GUIDE VASC ACCESS RIGHT  05/29/2022   NOSE SURGERY     x2   POLYPECTOMY  01/04/2019   Procedure: POLYPECTOMY;  Surgeon: Toney Reil, MD;  Location: [facility] SURGERY [facility];  Service: Endoscopy;;   POLYPECTOMY N/A 07/24/2021   Procedure: POLYPECTOMY;  Surgeon: Toney Reil, MD;  Location:  [facility] SURGERY CNTR;  Service: Endoscopy;  Laterality: N/A;   RADIOLOGY WITH ANESTHESIA N/A 10/01/2021   Procedure: Alfredia Client WITH ANESTHESIA;  Surgeon: Baldemar Lenis, MD;  Location: [facility] OR;  Service: Radiology;  Laterality: N/A;   SHOULDER ARTHROSCOPY WITH OPEN ROTATOR CUFF REPAIR Left 09/20/2018   Procedure: SHOULDER ARTHROSCOPY WITH OPEN ROTATOR CUFF REPAIR;  Surgeon: Christena Flake, MD;  Location: [facility] ORS;  Service: Orthopedics;  Laterality: Left;   SHOULDER CLOSED REDUCTION Left 12/21/2018   Procedure: MANIPULATION UNDER ANESTHESIA WITH STEROID INJECTION;  Surgeon: Christena Flake, MD;  Location: [facility] SURGERY CNTR;  Service: Orthopedics;  Laterality: Left;  Diabetic - insulin and oral meds   UMBILICAL HERNIA REPAIR     XI ROBOTIC ASSISTED INGUINAL HERNIA REPAIR WITH MESH Left 12/16/2021   Procedure: XI ROBOTIC ASSISTED INGUINAL HERNIA REPAIR WITH MESH;  Surgeon: [name], MD;  Location: [facility] ORS;  Service: General;  Laterality: Left;   Family History  Problem Relation Age of Onset   Diabetes Other    Hypertension Other    Breast cancer Neg Hx    Colon cancer Neg Hx    Social History   Socioeconomic History   Marital status: Widowed    Spouse name: Not on file   Number of children: 2   Years of education: Not on file   Highest education level: Associate degree: occupational, Scientist, product/process development, or vocational program  Occupational History   Not on file  Tobacco Use   Smoking status: Never    Passive exposure: Never   Smokeless tobacco: Never  Vaping Use   Vaping Use: Never used  Substance and Sexual Activity   Alcohol use: Never   Drug use: Never   Sexual activity: Not Currently  Other Topics Concern   Not on file  Social History Narrative   She is married and has two children   Grand daughter lives with her.   Social Determinants of Health   Financial Resource Strain: Low Risk  (03/11/2023)   Overall Financial Resource Strain (CARDIA)    Difficulty of  Paying Living Expenses: Not very hard  Food Insecurity: No Food Insecurity (03/11/2023)   Hunger Vital Sign    Worried About Running Out of Food in the Last Year: Never true    Ran Out of Food in the Last Year: Never true  Transportation Needs: No Transportation Needs (03/11/2023)   PRAPARE - Administrator, Civil Service (Medical): No    Lack of Transportation (Non-Medical): No  Physical Activity: Unknown (03/11/2023)   Exercise Vital Sign    Days of Exercise per Week: 0 days    Minutes of Exercise per Session: Not on file  Stress: Stress Concern Present (03/11/2023)   [place]  Institute of Occupational Health - Occupational Stress Questionnaire    Feeling of Stress : Very much  Social Connections: Moderately Integrated (03/11/2023)   Social Connection and Isolation Panel [NHANES]    Frequency of Communication with Friends and Family: More than three times a week    Frequency of Social Gatherings with Friends and Family: More than three times a week    Attends Religious Services: More than 4 times per year    Active Member of [employer] or Organizations: Yes    Attends Banker Meetings: More than 4 times per year    Marital Status: Widowed     Review of Systems  Constitutional:  Negative for appetite change and unexpected weight change.  HENT:  Negative for congestion and sinus pressure.   Respiratory:  Negative for cough, chest tightness and shortness of breath.   Cardiovascular:  Negative for chest pain and palpitations.  Gastrointestinal:  Negative for abdominal pain, nausea and vomiting.       Pain - perianal region.   Genitourinary:  Negative for difficulty urinating and dysuria.  Musculoskeletal:  Negative for joint swelling and myalgias.  Skin:  Negative for color change.       Perianal pain/irritation.   Neurological:  Negative for dizziness and headaches.  Psychiatric/Behavioral:  Negative for agitation and dysphoric mood.        Objective:     BP  126/70   Pulse 80   Temp 97.9 F (36.6 C)   Resp 16   Ht 5\' 4"  (1.626 m)   Wt 139 lb 12.8 oz (63.4 kg)   SpO2 98%   BMI 24.00 kg/m  Wt Readings from Last 3 Encounters:  03/15/23 139 lb 12.8 oz (63.4 kg)  03/02/23 137 lb (62.1 kg)  03/02/23 135 lb (61.2 kg)    Physical Exam Vitals reviewed.  Constitutional:      General: She is not in acute distress.    Appearance: Normal appearance.  HENT:     Head: Normocephalic and atraumatic.     Right Ear: External ear normal.     Left Ear: External ear normal.  Eyes:     General: No scleral icterus.       Right eye: No discharge.        Left eye: No discharge.     Conjunctiva/sclera: Conjunctivae normal.  Neck:     Thyroid: No thyromegaly.  Cardiovascular:     Rate and Rhythm: Normal rate and regular rhythm.  Pulmonary:     Effort: No respiratory distress.     Breath sounds: Normal breath sounds. No wheezing.  Abdominal:     General: Bowel sounds are normal.     Palpations: Abdomen is soft.     Tenderness: There is no abdominal tenderness.  Musculoskeletal:        General: No swelling or tenderness.     Cervical back: Neck supple. No tenderness.  Lymphadenopathy:     Cervical: No cervical adenopathy.  Skin:    Findings: No erythema or rash.  Neurological:     Mental Status: She is alert.  Psychiatric:        Mood and Affect: Mood normal.        Behavior: Behavior normal.      Outpatient Encounter Medications as of 03/15/2023  Medication Sig   acetaminophen (TYLENOL) 500 MG tablet Take 2 tablets (1,000 mg total) by mouth every 6 (six) hours as needed for mild pain.   albuterol (VENTOLIN HFA) 108 (90  Base) MCG/ACT inhaler Inhale 2 puffs into the lungs every 6 (six) hours as needed for wheezing or shortness of breath.   amLODipine (NORVASC) 5 MG tablet TAKE 1 TABLET(5 MG) BY MOUTH TWICE DAILY   budesonide-formoterol (SYMBICORT) 160-4.5 MCG/ACT inhaler Inhale 2 puffs into the lungs 2 (two) times daily. (Patient taking  differently: Inhale 2 puffs into the lungs 2 (two) times daily as needed (shortness of breath).)   Cholecalciferol (VITAMIN D) 50 MCG (2000 UT) tablet Take 2,000 Units by mouth daily.   CREON 36000-114000 units CPEP capsule Take 1-2 capsules (36,000-72,000 Units total) by mouth See admin instructions. Take 720000 units with each meal and 16109 units with snacks   diphenhydramine-acetaminophen (TYLENOL PM) 25-500 MG TABS tablet Take 2 tablets by mouth at bedtime as needed (sleep).   fluticasone (FLOVENT HFA) 110 MCG/ACT inhaler Inhale 2 puffs into the lungs 2 (two) times daily as needed (shortness of breath).   gabapentin (NEURONTIN) 600 MG tablet Take 600 mg by mouth daily.   insulin degludec (TRESIBA FLEXTOUCH) 100 UNIT/ML FlexTouch Pen Inject 16 Units into the skin daily.   ipratropium-albuterol (DUONEB) 0.5-2.5 (3) MG/3ML SOLN Take 3 mLs by nebulization every 4 (four) hours as needed.   loperamide (IMODIUM) 2 MG capsule Take 4 mg by mouth daily.   losartan (COZAAR) 100 MG tablet TAKE 1 TABLET(100 MG) BY MOUTH AT BEDTIME   Melatonin 10 MG TABS Take 10 mg by mouth at bedtime.   omeprazole (PRILOSEC) 20 MG capsule TAKE 1 CAPSULE(20 MG) BY MOUTH TWICE DAILY   ONE TOUCH ULTRA TEST test strip PATIENT NEEDS NEW METER STRIPS AND LANCETS FOR ONE TOUCH. HER INSURANCE NO LONGER COVERS ACCUCHEK.   rosuvastatin (CRESTOR) 10 MG tablet TAKE 1 TABLET(10 MG) BY MOUTH DAILY   [DISCONTINUED] amLODipine (NORVASC) 5 MG tablet TAKE 1 TABLET(5 MG) BY MOUTH TWICE DAILY   [DISCONTINUED] benzonatate (TESSALON) 200 MG capsule Take 1 capsule (200 mg total) by mouth 3 (three) times daily as needed for cough.   [DISCONTINUED] losartan (COZAAR) 100 MG tablet TAKE 1 TABLET(100 MG) BY MOUTH AT BEDTIME   [DISCONTINUED] omeprazole (PRILOSEC) 20 MG capsule TAKE 1 CAPSULE(20 MG) BY MOUTH TWICE DAILY   [DISCONTINUED] rosuvastatin (CRESTOR) 10 MG tablet TAKE 1 TABLET(10 MG) BY MOUTH DAILY   No facility-administered encounter  medications on file as of 03/15/2023.     Lab Results  Component Value Date   WBC 9.4 03/02/2023   HGB 11.3 (L) 03/02/2023   HCT 34.8 (L) 03/02/2023   PLT 244.0 03/02/2023   GLUCOSE 161 (H) 03/15/2023   CHOL 127 01/22/2023   TRIG 106.0 01/22/2023   HDL 55.80 01/22/2023   LDLDIRECT 74.0 04/19/2019   LDLCALC 50 01/22/2023   ALT 13 02/19/2023   AST 27 02/19/2023   NA 140 03/15/2023   K 4.0 03/15/2023   CL 104 03/15/2023   CREATININE 0.92 03/15/2023   BUN 11 03/15/2023   CO2 28 03/15/2023   TSH 2.021 07/02/2022   INR 1.1 02/19/2023   HGBA1C 6.4 01/22/2023   MICROALBUR 1.4 04/16/2022    DG Chest 2 View  Result Date: 02/18/2023 CLINICAL DATA:  Cough EXAM: CHEST - 2 VIEW COMPARISON:  None Available. FINDINGS: Lungs are clear.  No pleural effusion or pneumothorax. The heart is normal in size. Visualized osseous structures are within normal limits. IMPRESSION: Normal chest radiographs. Electronically Signed   By: Charline Bills M.D.   On: 02/18/2023 21:15       Assessment & Plan:  Decreased  GFR -     Basic metabolic panel  Type 2 diabetes mellitus with hyperglycemia, with long-term current use of insulin (HCC) Assessment & Plan: On insulin.  Follow sugars. Follow met b and A1c. Low carb diet and exercise. Have discussed titrating down insulin if low sugars.   Orders: -     AMB Referral to Pharmacy Medication Management -     Hemoglobin A1c; Future -     Basic metabolic panel; Future -     Microalbumin / creatinine urine ratio; Future  Iron deficiency anemia, unspecified iron deficiency anemia type Assessment & Plan: Seeing GI.  entyvio treatments for inflammatory polyposis.  Seeing hematology - iron infusions.  Follow.    Aneurysm [facility]) Assessment & Plan: Saw Dr Myer Haff [facility] - 01/2023 - At this point, she is asymptomatic. He recommended follow-up imaging. His office will arrange for MRI scan of the brain as well as MR angiogram of the brain in the summer. He will  touch base with her via telephone after that is performed. If her findings are stable, she will likely follow with yearly imaging.    Aneurysm of anterior communicating artery Assessment & Plan: Saw Dr Myer Haff [facility] - 01/2023 - At this point, she is asymptomatic. He recommended follow-up imaging. His office will arrange for MRI scan of the brain as well as MR angiogram of the brain in the summer. He will touch base with her via telephone after that is performed. If her findings are stable, she will likely follow with yearly imaging.    Mild intermittent asthma without complication Assessment & Plan: Breathing better.  No increased cough and congestion.  Continue symbicort.  Has duonebs if needed.    Depression, major, single episode, mild (HCC) Assessment & Plan: Discussed.  Overall appears to be handling things relatively well.  Will notify if feels needs further intervention.  Follow.    Gastroesophageal reflux disease, unspecified whether esophagitis present Assessment & Plan: Continue omeprazole. No increased acid reflux.     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  MRA head and neck were negative.  ECHO - no evidence of embolic source. With history of significant GI bleed in the past due to AVM, polyp bleed.     Hypercholesterolemia Assessment & Plan: Continue crestor. Follow lipid panel and liver function tests.    Orders: -     Hepatic function panel; Future -     Lipid panel; Future -     TSH; Future  Primary hypertension Assessment & Plan: On amlodipine and losartan.  Follow pressures.  Follow metabolic panel.    Pseudopolyposis of colon with complication, unspecified part of colon [facility]) Assessment & Plan: S/p colonoscopy 07/24/21 as outlined.  Continues f/u with GI.  On entyvio. Keep f/u with GI.    Meningioma  [facility]) Assessment & Plan: Followed by [facility].    Perianal excoriation Assessment & Plan: Increased pain and irritation as outlined.  Keeping area as dry as possible.  Has seen surgery.  Planning to follow up with dermatology this week.     Pituitary lesion [facility]) Assessment & Plan: Pituitary microadenoma -She was evaluated by neurology ([facility]) Recommended - MRI 6 months.  [facility] - 01/2023 - plan MRI/MRA   Psoriatic arthritis (HCC) Assessment & Plan: Stable.  Has been followed by rheumatology.     Stress Assessment & Plan: Increased stress with her underlying medical issues. Has good support.  Follow.    Swelling of lower extremity Assessment & Plan: Compression hose.  Has been wearing compression hose regularly for months.  Still with increased swelling.  ECHO 05/2022 - EF 65% with grade I DD.  Leg elevation.  Monitor salt intake.  Referred to vascular surgery.    Other orders -     amLODIPine Besylate; TAKE 1 TABLET(5 MG) BY MOUTH TWICE DAILY  Dispense: 180 tablet; Refill: 1 -     Losartan Potassium; TAKE 1 TABLET(100 MG) BY MOUTH AT BEDTIME  Dispense: 90 tablet; Refill: 1 -     Omeprazole; TAKE 1 CAPSULE(20 MG) BY MOUTH TWICE DAILY  Dispense: 180 capsule; Refill: 3 -     Rosuvastatin Calcium; TAKE 1 TABLET(10 MG) BY MOUTH DAILY  Dispense: 90 tablet; Refill: 3     Dale Venetie, MD

## 2023-03-16 DIAGNOSIS — L400 Psoriasis vulgaris: Secondary | ICD-10-CM | POA: Diagnosis not present

## 2023-03-16 LAB — BASIC METABOLIC PANEL
BUN: 11 mg/dL (ref 6–23)
CO2: 28 mEq/L (ref 19–32)
Calcium: 8.5 mg/dL (ref 8.4–10.5)
Chloride: 104 mEq/L (ref 96–112)
Creatinine, Ser: 0.92 mg/dL (ref 0.40–1.20)
GFR: 62.37 mL/min (ref >60.00)
Glucose, Bld: 161 mg/dL — ABNORMAL HIGH (ref 70–99)
Potassium: 4 mEq/L (ref 3.5–5.1)
Sodium: 140 mEq/L (ref 135–145)

## 2023-03-22 ENCOUNTER — Encounter: Payer: Self-pay | Admitting: Internal Medicine

## 2023-03-22 NOTE — Assessment & Plan Note (Signed)
S/p colonoscopy 07/24/21 as outlined.  Continues f/u with GI.  On entyvio. Keep f/u with GI.

## 2023-03-22 NOTE — Assessment & Plan Note (Signed)
Continue omeprazole. No increased acid reflux.   

## 2023-03-22 NOTE — Assessment & Plan Note (Signed)
Stable.  Has been followed by rheumatology.   

## 2023-03-22 NOTE — Assessment & Plan Note (Signed)
On insulin.  Follow sugars. Follow met b and A1c. Low carb diet and exercise. Have discussed titrating down insulin if low sugars.

## 2023-03-22 NOTE — Assessment & Plan Note (Addendum)
Compression hose.  Has been wearing compression hose regularly for months.  Still with increased swelling.  ECHO 05/2022 - EF 65% with grade I DD.  Leg elevation.  Monitor salt intake.  Referred to vascular surgery.

## 2023-03-22 NOTE — Assessment & Plan Note (Signed)
Continue crestor. Follow lipid panel and liver function tests.   

## 2023-03-22 NOTE — Assessment & Plan Note (Signed)
Increased stress with her underlying medical issues. Has good support.  Follow.  

## 2023-03-22 NOTE — Assessment & Plan Note (Signed)
On amlodipine and losartan.  Follow pressures.  Follow metabolic panel.  

## 2023-03-22 NOTE — Assessment & Plan Note (Signed)
Seeing GI.  entyvio treatments for inflammatory polyposis.  Seeing hematology - iron infusions.  Follow.  

## 2023-03-22 NOTE — Assessment & Plan Note (Signed)
Saw Dr Yarbrough [facility] - 01/2023 - At this point, she is asymptomatic. He recommended follow-up imaging. His office will arrange for MRI scan of the brain as well as MR angiogram of the brain in the summer. He will touch base with her via telephone after that is performed. If her findings are stable, she will likely follow with yearly imaging.  

## 2023-03-22 NOTE — Assessment & Plan Note (Signed)
Increased pain and irritation as outlined.  Keeping area as dry as possible.  Has seen surgery.  Planning to follow up with dermatology this week.

## 2023-03-22 NOTE — Assessment & Plan Note (Signed)
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  MRA head and neck were negative.  ECHO - no evidence of embolic source. With history of significant GI bleed in the past due to AVM, polyp bleed.   

## 2023-03-22 NOTE — Assessment & Plan Note (Signed)
Discussed.  Overall appears to be handling things relatively well.  Will notify if feels needs further intervention.  Follow.  

## 2023-03-22 NOTE — Assessment & Plan Note (Signed)
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  [facility] - 01/2023 - plan MRI/MRA 

## 2023-03-22 NOTE — Assessment & Plan Note (Signed)
Followed by [facility].

## 2023-03-22 NOTE — Assessment & Plan Note (Signed)
Breathing better.  No increased cough and congestion.  Continue symbicort.  Has duonebs if needed.

## 2023-03-25 ENCOUNTER — Telehealth: Payer: Self-pay

## 2023-03-25 ENCOUNTER — Ambulatory Visit: Payer: PPO | Admitting: Surgery

## 2023-03-25 NOTE — Telephone Encounter (Signed)
Lm for patient to ask if she has had previous sleep study.  

## 2023-03-26 ENCOUNTER — Telehealth: Payer: Self-pay

## 2023-03-26 ENCOUNTER — Ambulatory Visit
Admission: RE | Admit: 2023-03-26 | Discharge: 2023-03-26 | Disposition: A | Discharge status: Home / Self Care | Payer: PPO | Source: Ambulatory Visit | Attending: Gastroenterology | Admitting: Gastroenterology

## 2023-03-26 DIAGNOSIS — K645 Perianal venous thrombosis: Secondary | ICD-10-CM | POA: Insufficient documentation

## 2023-03-26 DIAGNOSIS — Z01818 Encounter for other preprocedural examination: Secondary | ICD-10-CM | POA: Diagnosis present

## 2023-03-26 MED ORDER — VEDOLIZUMAB 300 MG IV SOLR
300.0000 mg | Freq: Once | INTRAVENOUS | Status: AC
  Administered 2023-03-26: 300 mg via INTRAVENOUS
  Filled 2023-03-26: qty 5

## 2023-03-26 NOTE — Telephone Encounter (Signed)
I reviewed your recent lab results and your sodium and potassium are within normal limits.  Your kidney function has improved.  Let us know if you need anything.   Dr Lorin Picket    [facility] to call back in regards to lab results above.

## 2023-03-26 NOTE — Telephone Encounter (Signed)
Spoke to patient. She stated that she had previous previously sleep study >10y ago. She does not wear cpap. Nothing further needed.

## 2023-03-30 ENCOUNTER — Ambulatory Visit (INDEPENDENT_AMBULATORY_CARE_PROVIDER_SITE_OTHER): Payer: PPO | Admitting: Primary Care

## 2023-03-30 ENCOUNTER — Ambulatory Visit: Payer: PPO | Admitting: Surgery

## 2023-03-30 ENCOUNTER — Encounter: Payer: Self-pay | Admitting: Primary Care

## 2023-03-30 VITALS — BP 122/60 | HR 74 | Temp 97.7°F | Ht 64.0 in | Wt 140.2 lb

## 2023-03-30 DIAGNOSIS — G479 Sleep disorder, unspecified: Secondary | ICD-10-CM

## 2023-03-30 DIAGNOSIS — R400 Somnolence: Secondary | ICD-10-CM

## 2023-03-30 NOTE — Assessment & Plan Note (Addendum)
-   Patient has difficulty falling and staying asleep.  She is currently taking 600 mg of Neurontin and 10 mg of melatonin with inconsistent results.  We will check a sleep study to rule out sleep apnea as cause of sleep disruption.  Otherwise if negative, consider trial of either trazodone or Rozerem along with CBT.

## 2023-03-30 NOTE — Progress Notes (Signed)
Reviewed and agree with assessment/plan.   Jaxtyn Linville, MD  Pulmonary/Critical Care 03/30/2023, 2:41 PM Pager:  [phone]  

## 2023-03-30 NOTE — Patient Instructions (Addendum)
Recommendations: Continue gabapentin and melatonin as directed We will check home sleep study to rule out sleep apnea as cause of sleep disruption If negative we can trial different sleep aid  Avoid late meals, caffeine and screen time 2 hours before bed  Follow-up: Scheduled follow-up 1-2 weeks after sleep study to review results   Insomnia Insomnia is a sleep disorder that makes it difficult to fall asleep or stay asleep. Insomnia can cause fatigue, low energy, difficulty concentrating, mood swings, and poor performance at work or school. There are three different ways to classify insomnia: Difficulty falling asleep. Difficulty staying asleep. Waking up too early in the morning. Any type of insomnia can be long-term (chronic) or short-term (acute). Both are common. Short-term insomnia usually lasts for 3 months or less. Chronic insomnia occurs at least three times a week for longer than 3 months. What are the causes? Insomnia may be caused by another condition, situation, or substance, such as: Having certain mental health conditions, such as anxiety and depression. Using caffeine, alcohol, tobacco, or drugs. Having gastrointestinal conditions, such as gastroesophageal reflux disease (GERD). Having certain medical conditions. These include: Asthma. Alzheimer's disease. Stroke. Chronic pain. An overactive thyroid gland (hyperthyroidism). Other sleep disorders, such as restless legs syndrome and sleep apnea. Menopause. Sometimes, the cause of insomnia may not be known. What increases the risk? Risk factors for insomnia include: Gender. Females are affected more often than males. Age. Insomnia is more common as people get older. Stress and certain medical and mental health conditions. Lack of exercise. Having an irregular work schedule. This may include working night shifts and traveling between different time zones. What are the signs or symptoms? If you have insomnia, the main  symptom is having trouble falling asleep or having trouble staying asleep. This may lead to other symptoms, such as: Feeling tired or having low energy. Feeling nervous about going to sleep. Not feeling rested in the morning. Having trouble concentrating. Feeling irritable, anxious, or depressed. How is this diagnosed? This condition may be diagnosed based on: Your symptoms and medical history. Your health care provider may ask about: Your sleep habits. Any medical conditions you have. Your mental health. A physical exam. How is this treated? Treatment for insomnia depends on the cause. Treatment may focus on treating an underlying condition that is causing the insomnia. Treatment may also include: Medicines to help you sleep. Counseling or therapy. Lifestyle adjustments to help you sleep better. Follow these instructions at home: Eating and drinking  Limit or avoid alcohol, caffeinated beverages, and products that contain nicotine and tobacco, especially close to bedtime. These can disrupt your sleep. Do not eat a large meal or eat spicy foods right before bedtime. This can lead to digestive discomfort that can make it hard for you to sleep. Sleep habits  Keep a sleep diary to help you and your health care provider figure out what could be causing your insomnia. Write down: When you sleep. When you wake up during the night. How well you sleep and how rested you feel the next day. Any side effects of medicines you are taking. What you eat and drink. Make your bedroom a dark, comfortable place where it is easy to fall asleep. Put up shades or blackout curtains to block light from outside. Use a white noise machine to block noise. Keep the temperature cool. Limit screen use before bedtime. This includes: Not watching TV. Not using your smartphone, tablet, or computer. Stick to a routine that includes going to  bed and waking up at the same times every day and night. This can help  you fall asleep faster. Consider making a quiet activity, such as reading, part of your nighttime routine. Try to avoid taking naps during the day so that you sleep better at night. Get out of bed if you are still awake after 15 minutes of trying to sleep. Keep the lights down, but try reading or doing a quiet activity. When you feel sleepy, go back to bed. General instructions Take over-the-counter and prescription medicines only as told by your health care provider. Exercise regularly as told by your health care provider. However, avoid exercising in the hours right before bedtime. Use relaxation techniques to manage stress. Ask your health care provider to suggest some techniques that may work well for you. These may include: Breathing exercises. Routines to release muscle tension. Visualizing peaceful scenes. Make sure that you drive carefully. Do not drive if you feel very sleepy. Keep all follow-up visits. This is important. Contact a health care provider if: You are tired throughout the day. You have trouble in your daily routine due to sleepiness. You continue to have sleep problems, or your sleep problems get worse. Get help right away if: You have thoughts about hurting yourself or someone else. Get help right away if you feel like you may hurt yourself or others, or have thoughts about taking your own life. Go to your nearest emergency room or: Call 911. Call the National Suicide Prevention Lifeline at 501-505-9015 or 988. This is open 24 hours a day. Text the Crisis Text Line at (775)743-7395. Summary Insomnia is a sleep disorder that makes it difficult to fall asleep or stay asleep. Insomnia can be long-term (chronic) or short-term (acute). Treatment for insomnia depends on the cause. Treatment may focus on treating an underlying condition that is causing the insomnia. Keep a sleep diary to help you and your health care provider figure out what could be causing your insomnia. This  information is not intended to replace advice given to you by your health care provider. Make sure you discuss any questions you have with your health care provider. Document Revised: 09/29/2021 Document Reviewed: 09/29/2021 Elsevier Patient Education  2024 [employer].

## 2023-03-30 NOTE — Progress Notes (Signed)
@Patient  ID: Ashley Gross, female    DOB: 03-Sep-1951, 72 y.o.   MRN: [id number]  Chief Complaint  Patient presents with   sleep consult    Sleep study >10y ago. C/o restless sleep and daytime sleepiness.     Referring provider: Dale Rockford, MD  HPI: 72 year old female, never smoked.  Past medical history significant for hypertension, brain aneurysm, mild intermittent asthma, GERD, pituitary lesion, type 2 diabetes, psoriatic arthritis, sleep difficulties, vitamin D deficiency.  03/30/2023 Patient presents today for sleep consult.  She reports symptoms of restless sleep. She has been having more difficulty sleeping last 2 years since her husband passed away. She was accustom to living along and is now living with her daughter and her husband. She is not particularly happy with her living situation. She goes to her bedroom around 7-7:30pm. She has a hard time falling and staying asleep. She does not snore. She has woken up gasping for breath when she has had an upper respiratory infection. Typical bedtime is 8:30pm. It will take her around 2 hours to fall asleep. She wakes up every 1.5-2 hours. She starts her day at 5am. Weight is up 20 lbs. She had a sleep study over 10 years ago. She does not wear CPAP or oxygen. She takes 600mg  gabapentin and 10mg  melatonin for sleep. This regimen sometimes works and other times it does not. Neurology prescribes Neurontin for sleep.  She will get sleepy at work when on computer but does not fall asleep.    Allergies  Allergen Reactions   Aspirin Other (See Comments)    Increased bleeding   Beta Adrenergic Blockers     3rd degree blockage    Lisinopril Cough   Mtx Support [Cobalamine Combinations] Other (See Comments)    Respiratory symptoms   Nsaids    Vasotec [Enalapril] Cough   Verapamil Other (See Comments)    Heart block   Voltaren [Diclofenac Sodium]     Pt unsure of reaction    Erythromycin Other (See Comments)    Gi intolerance    Indocin [Indomethacin]     Upset stomach   Jardiance [Empagliflozin] Other (See Comments)    Recurrent yeast infections, even with washout and rechallenge    Immunization History  Administered Date(s) Administered   Fluad Quad(high Dose 65+) 07/20/2019, 07/23/2020, 07/24/2021, 08/13/2022   Influenza Whole 07/03/2012   Influenza, High Dose Seasonal PF 07/12/2017, 08/01/2018   Influenza, Seasonal, Injecte, Preservative Fre 09/04/2013   Influenza-Unspecified 07/17/2014, 07/31/2015, 07/15/2016   PFIZER Comirnaty(Gray Top)Covid-19 Tri-Sucrose Vaccine 03/25/2021   PFIZER(Purple Top)SARS-COV-2 Vaccination 12/13/2019, 01/03/2020, 08/15/2020   Pneumococcal Conjugate-13 05/17/2017   Pneumococcal Polysaccharide-23 11/13/2020    Past Medical History:  Diagnosis Date   Abnormal liver function test 10/06/2012   Anemia 01/25/2022   Anemia, unspecified    Asthma    B12 deficiency 02/11/2019   Bleeding internal hemorrhoids 09/13/2017   Choledocholithiasis 06/29/2012   Formatting of this note might be different from the original. Dilated CBD on abdominal imaging 04/2012   Chronic diarrhea 02/16/2013   Colitis    Complication of anesthesia    asthma attack after shoulder surgery   Diabetes mellitus (HCC)    type II   Esophagitis    GERD (gastroesophageal reflux disease)    Heart murmur    for years- nothing to be concerned about.   History of kidney stones    Hx of arteriovenous malformation (AVM)    Hypertension    IBS (irritable bowel syndrome)  Loss of weight    Pernicious anemia    Personal history of colonic polyps 02/10/2013   02/08/13 colonoscopy - question of rectal varices, two 3-78mm polyps in the sigmoid colon, mass at the hepatic flexure, one 5mm polyp at the hepatic flexure, nodule at the ileocecal valve, congested mucusa in the entire colon, flattened villi mucosa in the terminal ileum.     Pneumonia    Psoriatic arthritis (HCC)    s/p penicillamine, plaquenil, MTX,  sulfasalazine.  s/p Embrel, Humira,.  Iritis.     Pure hypercholesterolemia    Rectal bleeding 07/15/2017   Stroke (HCC) 05/29/2022   per patient    Tobacco History: Social History   Tobacco Use  Smoking Status Never   Passive exposure: Never  Smokeless Tobacco Never   Counseling given: Not Answered   Outpatient Medications Prior to Visit  Medication Sig Dispense Refill   acetaminophen (TYLENOL) 500 MG tablet Take 2 tablets (1,000 mg total) by mouth every 6 (six) hours as needed for mild pain.     albuterol (VENTOLIN HFA) 108 (90 Base) MCG/ACT inhaler Inhale 2 puffs into the lungs every 6 (six) hours as needed for wheezing or shortness of breath.     amLODipine (NORVASC) 5 MG tablet TAKE 1 TABLET(5 MG) BY MOUTH TWICE DAILY 180 tablet 1   budesonide-formoterol (SYMBICORT) 160-4.5 MCG/ACT inhaler Inhale 2 puffs into the lungs 2 (two) times daily. (Patient taking differently: Inhale 2 puffs into the lungs 2 (two) times daily as needed (shortness of breath).) 3 each 3   Cholecalciferol (VITAMIN D) 50 MCG (2000 UT) tablet Take 2,000 Units by mouth daily.     CREON 36000-114000 units CPEP capsule Take 1-2 capsules (36,000-72,000 Units total) by mouth See admin instructions. Take 720000 units with each meal and 82956 units with snacks 240 capsule 3   diphenhydramine-acetaminophen (TYLENOL PM) 25-500 MG TABS tablet Take 2 tablets by mouth at bedtime as needed (sleep).     fluticasone (FLOVENT HFA) 110 MCG/ACT inhaler Inhale 2 puffs into the lungs 2 (two) times daily as needed (shortness of breath).     gabapentin (NEURONTIN) 600 MG tablet Take 600 mg by mouth daily.     insulin degludec (TRESIBA FLEXTOUCH) 100 UNIT/ML FlexTouch Pen Inject 16 Units into the skin daily. 12 mL 2   ipratropium-albuterol (DUONEB) 0.5-2.5 (3) MG/3ML SOLN Take 3 mLs by nebulization every 4 (four) hours as needed. 360 mL 0   loperamide (IMODIUM) 2 MG capsule Take 4 mg by mouth daily.     losartan (COZAAR) 100 MG  tablet TAKE 1 TABLET(100 MG) BY MOUTH AT BEDTIME 90 tablet 1   Melatonin 10 MG TABS Take 10 mg by mouth at bedtime.     omeprazole (PRILOSEC) 20 MG capsule TAKE 1 CAPSULE(20 MG) BY MOUTH TWICE DAILY 180 capsule 3   ONE TOUCH ULTRA TEST test strip PATIENT NEEDS NEW METER STRIPS AND LANCETS FOR ONE TOUCH. HER INSURANCE NO LONGER COVERS ACCUCHEK. 100 each PRN   rosuvastatin (CRESTOR) 10 MG tablet TAKE 1 TABLET(10 MG) BY MOUTH DAILY 90 tablet 3   No facility-administered medications prior to visit.   Review of Systems  Review of Systems  Constitutional:  Positive for fatigue.  HENT: Negative.    Respiratory: Negative.    Psychiatric/Behavioral:  Positive for sleep disturbance.      Physical Exam  BP 122/60 (BP Location: Left Arm, Cuff Size: Normal)   Pulse 74   Temp 97.7 F (36.5 C) (Temporal)   Ht  5\' 4"  (1.626 m)   Wt 140 lb 3.2 oz (63.6 kg)   SpO2 95%   BMI 24.07 kg/m  Physical Exam Constitutional:      Appearance: Normal appearance.  HENT:     Head: Normocephalic and atraumatic.  Cardiovascular:     Rate and Rhythm: Normal rate and regular rhythm.  Pulmonary:     Effort: Pulmonary effort is normal.     Breath sounds: Normal breath sounds.  Skin:    General: Skin is warm and dry.  Neurological:     General: No focal deficit present.     Mental Status: She is alert and oriented to person, place, and time. Mental status is at baseline.  Psychiatric:        Mood and Affect: Mood normal.        Behavior: Behavior normal.        Thought Content: Thought content normal.        Judgment: Judgment normal.      Lab Results:  CBC    Component Value Date/Time   WBC 9.4 03/02/2023 1154   RBC 4.01 03/02/2023 1154   HGB 11.3 (L) 03/02/2023 1154   HGB 12.2 12/28/2018 1203   HCT 34.8 (L) 03/02/2023 1154   HCT 37.6 12/28/2018 1203   PLT 244.0 03/02/2023 1154   PLT 291 12/28/2018 1203   MCV 86.7 03/02/2023 1154   MCV 91 12/28/2018 1203   MCH 27.8 02/19/2023 0519   MCHC  32.6 03/02/2023 1154   RDW 14.9 03/02/2023 1154   RDW 13.6 12/28/2018 1203   LYMPHSABS 1.8 03/02/2023 1154   MONOABS 0.7 03/02/2023 1154   EOSABS 0.2 03/02/2023 1154   BASOSABS 0.0 03/02/2023 1154    BMET    Component Value Date/Time   NA 140 03/15/2023 1530   K 4.0 03/15/2023 1530   K 4.1 06/12/2013 1657   CL 104 03/15/2023 1530   CO2 28 03/15/2023 1530   GLUCOSE 161 (H) 03/15/2023 1530   BUN 11 03/15/2023 1530   BUN 15 07/17/2021 1635   CREATININE 0.92 03/15/2023 1530   CREATININE 1.00 (H) 11/01/2018 1035   CALCIUM 8.5 03/15/2023 1530   GFRNONAA >60 02/19/2023 0519   GFRAA >60 11/28/2019 0953    BNP    Component Value Date/Time   BNP 192.1 (H) 07/02/2022 1144    ProBNP No results found for: "PROBNP"  Imaging: No results found.   Assessment & Plan:   Daytime somnolence - Patient reports restless and non-restorative sleep. Sleep is fragmented. She has a hard time falling and staying asleep. She has occasionally woken herself up gasping for breath. Epworth score 5. Concern patient could have underlying sleep apnea causing sleep disruption. Needs home sleep study to evaluate.  Reviewed risks of untreated sleep apnea including cardiac arrhythmias, pulmonary hypertension, diabetes and stroke.  We also discussed treatment options including weight loss, oral appliance, CPAP therapy or referral to ENT for possible surgical options.  Encourage side sleeping position or elevate head of bed 30 degrees.  Advised against driving if experiencing excessive daytime sleepiness fatigue.  Follow-up 1 to 2 weeks after sleep study review results and treatment options if needed.  Sleep difficulties - Patient has difficulty falling and staying asleep.  She is currently taking 600 mg of Neurontin and 10 mg of melatonin with inconsistent results.  We will check a sleep study to rule out sleep apnea as cause of sleep disruption.  Otherwise if negative, consider trial of either trazodone or  Rozerem along with CBT.   Glenford Bayley, NP 03/30/2023

## 2023-03-30 NOTE — Assessment & Plan Note (Signed)
-   Patient reports restless and non-restorative sleep. Sleep is fragmented. She has a hard time falling and staying asleep. She has occasionally woken herself up gasping for breath. Epworth score 5. Concern patient could have underlying sleep apnea causing sleep disruption. Needs home sleep study to evaluate.  Reviewed risks of untreated sleep apnea including cardiac arrhythmias, pulmonary hypertension, diabetes and stroke.  We also discussed treatment options including weight loss, oral appliance, CPAP therapy or referral to ENT for possible surgical options.  Encourage side sleeping position or elevate head of bed 30 degrees.  Advised against driving if experiencing excessive daytime sleepiness fatigue.  Follow-up 1 to 2 weeks after sleep study review results and treatment options if needed.

## 2023-04-05 ENCOUNTER — Other Ambulatory Visit: Payer: Self-pay | Admitting: Internal Medicine

## 2023-04-05 ENCOUNTER — Other Ambulatory Visit: Payer: PPO | Admitting: Pharmacist

## 2023-04-05 DIAGNOSIS — E1121 Type 2 diabetes mellitus with diabetic nephropathy: Secondary | ICD-10-CM

## 2023-04-05 DIAGNOSIS — R053 Chronic cough: Secondary | ICD-10-CM

## 2023-04-05 MED ORDER — BUDESONIDE-FORMOTEROL FUMARATE 160-4.5 MCG/ACT IN AERO
2.0000 | INHALATION_SPRAY | Freq: Two times a day (BID) | RESPIRATORY_TRACT | 3 refills | Status: DC | PRN
Start: 2023-04-05 — End: 2023-06-02

## 2023-04-05 MED ORDER — TRESIBA FLEXTOUCH 100 UNIT/ML ~~LOC~~ SOPN: 16.0000 [IU] | PEN_INJECTOR | Freq: Every day | SUBCUTANEOUS | 1 refills | Status: DC

## 2023-04-05 NOTE — Progress Notes (Signed)
04/05/2023 Name: Ashley Gross MRN: [id number] DOB: 1951-10-11  Chief Complaint  Patient presents with   Medication Management   Diabetes    Ashley Gross is a 72 y.o. year old female who presented for a telephone visit.   They were referred to the pharmacist by their PCP for assistance in managing medication access.   Subjective:  Care Team: Primary Care Provider: Dale Magazine, MD ; Next Scheduled Visit: 05/07/23   Medication Access/Adherence  Current Pharmacy:  [facility] [phone] [address] [place] [address] Phone: [phone] Fax: [phone]  [facility] - [place], [place] - [address] [place] [address] Phone: [phone] Fax: [phone]  [id number] - [place], [place] - [address] [place] [address] Phone: [phone] Fax: [phone]   Patient reports affordability concerns with their medications: Yes  Patient reports access/transportation concerns to their pharmacy: No  Patient reports adherence concerns with their medications:  No    Reports she needs to re-enroll in [place] assistance for 2024. Creon assistance was coordinated by Dr. Verdis Gross office. Symbicort assistance program has closed.   Diabetes:  Current medications: Tresiba 16 units daily  Patient denies hypoglycemic s/sx including dizziness, shakiness, sweating. Patient denies hyperglycemic symptoms including polyuria, polydipsia, polyphagia, nocturia, neuropathy, blurred vision.  Hypertension:  Current medications: losartan 100 mg daily, amlodipine 5 mg daily  Hyperlipidemia/ASCVD Risk Reduction  Current lipid lowering medications: rosuvastatin 10 mg daily   Asthma:  Current medications: Symbicort 160/4.5 mg 2 puffs twice daily; needs refill to her pharmacy   Objective:  Lab Results  Component Value Date   HGBA1C 6.4 01/22/2023    Lab Results  Component Value Date   CREATININE  0.92 03/15/2023   BUN 11 03/15/2023   NA 140 03/15/2023   K 4.0 03/15/2023   CL 104 03/15/2023   CO2 28 03/15/2023    Lab Results  Component Value Date   CHOL 127 01/22/2023   HDL 55.80 01/22/2023   LDLCALC 50 01/22/2023   LDLDIRECT 74.0 04/19/2019   TRIG 106.0 01/22/2023   CHOLHDL 2 01/22/2023    Medications Reviewed Today     Reviewed by Ashley Gross, RPH-CPP (Pharmacist) on 04/05/23 at 1507  Med List Status: <None>   Medication Order Taking? Sig Documenting Provider Last Dose Status Informant  acetaminophen (TYLENOL) 500 MG tablet [id number]  Take 2 tablets (1,000 mg total) by mouth every 6 (six) hours as needed for mild pain. Ashley Dodge, MD  Active Self, Multiple Informants  albuterol (VENTOLIN HFA) 108 (90 Base) MCG/ACT inhaler [id number]  Inhale 2 puffs into the lungs every 6 (six) hours as needed for wheezing or shortness of breath. [provider]  Active Self, Multiple Informants  amLODipine (NORVASC) 5 MG tablet [id number] Yes TAKE 1 TABLET(5 MG) BY MOUTH TWICE DAILY Ashley [place], MD Taking Active   budesonide-formoterol [facility]) 160-4.5 MCG/ACT inhaler [id number] Yes Inhale 2 puffs into the lungs 2 (two) times daily.  Patient taking differently: Inhale 2 puffs into the lungs 2 (two) times daily as needed (shortness of breath).   Ashley [place], MD Taking Active Self, Multiple Informants  Cholecalciferol (VITAMIN D) 50 MCG (2000 UT) tablet [id number] Yes Take 2,000 Units by mouth daily. [provider] Taking Active Self, Multiple Informants  CREON 36000-114000 units CPEP capsule [id number] Yes Take 1-2 capsules (36,000-72,000 Units total) by mouth See admin instructions. Take 720000 units with  each meal and 16109 units with snacks Vanga, Ashley Dubonnet, MD Taking Active Multiple Informants  diphenhydramine-acetaminophen (TYLENOL PM) 25-500 MG TABS tablet [id number]  Take 2 tablets by mouth at bedtime as needed (sleep). [provider]   Active Self, Multiple Informants  gabapentin (NEURONTIN) 600 MG tablet [id number] Yes Take 600 mg by mouth daily. [provider] Taking Active Multiple Informants  insulin degludec (TRESIBA FLEXTOUCH) 100 UNIT/ML FlexTouch Pen [id number] Yes Inject 16 Units into the skin daily. Ashley [place], MD Taking Active Multiple Informants           Med Note Ashley Gross Apr 05, 2023  3:04 PM) 14 units daily  ipratropium-albuterol (DUONEB) 0.5-2.5 (3) MG/3ML SOLN [id number]  Take 3 mLs by nebulization every 4 (four) hours as needed. Ashley Moore, MD  Active   loperamide (IMODIUM) 2 MG capsule [id number]  Take 4 mg by mouth daily. [provider]  Active Self, Multiple Informants  losartan (COZAAR) 100 MG tablet [id number] Yes TAKE 1 TABLET(100 MG) BY MOUTH AT BEDTIME Ashley Sipsey, MD Taking Active   Melatonin 10 MG TABS [id number] No Take 10 mg by mouth at bedtime.  Patient not taking: Reported on 04/05/2023   [provider] Not Taking Active Self, Multiple Informants  omeprazole (PRILOSEC) 20 MG capsule [id number] Yes TAKE 1 CAPSULE(20 MG) BY MOUTH TWICE DAILY Ashley Brilliant, MD Taking Active   ONE TOUCH ULTRA TEST test strip [id number] Yes PATIENT NEEDS NEW METER STRIPS AND LANCETS FOR ONE TOUCH. HER INSURANCE NO LONGER COVERS ACCUCHEK. Ashley Ward, MD Taking Active Self, Multiple Informants  rosuvastatin (CRESTOR) 10 MG tablet [id number] Yes TAKE 1 TABLET(10 MG) BY MOUTH DAILY Ashley [place], MD Taking Active               Assessment/Plan:   Diabetes: - Controlled, need for access support - Will collaborate with CPhT team to re-apply for assistance for Tresiba for 2024.   Hypertension: - Controlled - Recommend to continue current regimen at this time  Hyperlipidemia/ASCVD Risk Reduction - Controlled, recommend to continue current regimen  Asthma: - Controlled per patient report - Recommend to continue current regimen at this time. Refill  on Symbicort sent to local pharmacy.   Follow Up Plan: follow up with PCP as scheduled  [name] Eppie Gibson, PharmD, BCACP, CPP [facility] [phone]

## 2023-04-05 NOTE — Patient Instructions (Signed)
Ashley Gross,  It was great talking to you today!  Our pharmacy technician team will outreach to reapply for Tresiba patient assistance.   Please reach out with any questions or concerns!  [name] Clearance Coots, PharmD [phone]

## 2023-04-06 ENCOUNTER — Telehealth: Payer: Self-pay

## 2023-04-06 ENCOUNTER — Encounter: Payer: Self-pay | Admitting: Oncology

## 2023-04-06 ENCOUNTER — Other Ambulatory Visit (HOSPITAL_COMMUNITY): Payer: Self-pay

## 2023-04-06 NOTE — Telephone Encounter (Signed)
Submitted application ELECTRONICALLY for TRESIBA FLEXTOUCH to [employer] for patient assistance.   Phone: [phone]  [facility] Patient Advocate [phone]) [phone]

## 2023-04-06 NOTE — Telephone Encounter (Signed)
-----   Message from [name], RPH-CPP sent at 04/05/2023  3:16 PM EDT ----- Please initiate Ashley Gross re-enrollment for patient through [employer]. Thanks!

## 2023-04-06 NOTE — Telephone Encounter (Signed)
PA needed

## 2023-04-13 ENCOUNTER — Other Ambulatory Visit (HOSPITAL_COMMUNITY): Payer: Self-pay

## 2023-04-13 NOTE — Telephone Encounter (Signed)
Per test claim, Symbicort is not on the formulary. Advair diskus is preferred and according to test claim, would be free of charge to pt, Ashley Gross is also covered but would be $94.00 a month if filled at [facility]. Please change if appropriate or advise. Thank you

## 2023-04-14 NOTE — Telephone Encounter (Signed)
Ok to change inhaler 

## 2023-04-15 NOTE — Telephone Encounter (Signed)
Ok to change. Per note, advair is free. Does she want to change to advair.  If so, ok to change.

## 2023-04-15 NOTE — Telephone Encounter (Signed)
Faxed to you

## 2023-04-15 NOTE — Telephone Encounter (Signed)
No I just copy and pasted in this encounter.

## 2023-04-15 NOTE — Telephone Encounter (Signed)
Received notification from [facility] regarding PENDING  for [facility]. NEED PAT INCOME VERIFICATION.  PLEASE BE ADVISED

## 2023-04-19 ENCOUNTER — Telehealth: Payer: Self-pay | Admitting: Primary Care

## 2023-04-19 NOTE — Telephone Encounter (Signed)
I have spoke with Ashley Gross and she has been scheduled to pick up HST machine 04/20/23 @1 :30pm

## 2023-04-19 NOTE — Telephone Encounter (Signed)
Patient returning call, requesting to speak with Ashley Gross Fail to schedule HST

## 2023-04-20 ENCOUNTER — Ambulatory Visit: Payer: PPO

## 2023-04-20 DIAGNOSIS — R400 Somnolence: Secondary | ICD-10-CM

## 2023-04-20 DIAGNOSIS — G4733 Obstructive sleep apnea (adult) (pediatric): Secondary | ICD-10-CM | POA: Diagnosis not present

## 2023-04-21 NOTE — Telephone Encounter (Signed)
Patient is ok to change to advair. Just wanted to confirm directions should stay the same since switching medications.

## 2023-04-22 ENCOUNTER — Encounter: Payer: Self-pay | Admitting: Oncology

## 2023-04-22 DIAGNOSIS — G4733 Obstructive sleep apnea (adult) (pediatric): Secondary | ICD-10-CM | POA: Diagnosis not present

## 2023-04-22 MED ORDER — FLUTICASONE-SALMETEROL 100-50 MCG/ACT IN AEPB: 1.0000 | INHALATION_SPRAY | Freq: Two times a day (BID) | RESPIRATORY_TRACT | 3 refills | Status: DC

## 2023-04-22 NOTE — Telephone Encounter (Signed)
Notify - Rx for advair sent in to pharmacy - one inhalation bid.  This will be instead of symbicort

## 2023-04-23 ENCOUNTER — Other Ambulatory Visit: Payer: Self-pay | Admitting: Internal Medicine

## 2023-04-28 ENCOUNTER — Ambulatory Visit
Admission: RE | Admit: 2023-04-28 | Discharge: 2023-04-28 | Disposition: A | Discharge status: Home / Self Care | Payer: PPO | Source: Ambulatory Visit | Attending: Neurosurgery | Admitting: Neurosurgery

## 2023-04-28 DIAGNOSIS — I671 Cerebral aneurysm, nonruptured: Secondary | ICD-10-CM | POA: Diagnosis not present

## 2023-04-28 DIAGNOSIS — D329 Benign neoplasm of meninges, unspecified: Secondary | ICD-10-CM

## 2023-04-28 DIAGNOSIS — D320 Benign neoplasm of cerebral meninges: Secondary | ICD-10-CM | POA: Diagnosis not present

## 2023-04-28 DIAGNOSIS — D352 Benign neoplasm of pituitary gland: Secondary | ICD-10-CM | POA: Diagnosis not present

## 2023-04-28 MED ORDER — GADOBUTROL 1 MMOL/ML IV SOLN
6.0000 mL | Freq: Once | INTRAVENOUS | Status: AC | PRN
  Administered 2023-04-28: 6 mL via INTRAVENOUS

## 2023-04-30 ENCOUNTER — Other Ambulatory Visit (INDEPENDENT_AMBULATORY_CARE_PROVIDER_SITE_OTHER): Payer: PPO

## 2023-04-30 DIAGNOSIS — I10 Essential (primary) hypertension: Secondary | ICD-10-CM | POA: Diagnosis not present

## 2023-04-30 DIAGNOSIS — Z794 Long term (current) use of insulin: Secondary | ICD-10-CM | POA: Diagnosis not present

## 2023-04-30 DIAGNOSIS — E1165 Type 2 diabetes mellitus with hyperglycemia: Secondary | ICD-10-CM | POA: Diagnosis not present

## 2023-04-30 DIAGNOSIS — E7800 Pure hypercholesterolemia, unspecified: Secondary | ICD-10-CM

## 2023-04-30 LAB — BASIC METABOLIC PANEL
BUN: 16 mg/dL (ref 6–23)
CO2: 29 mEq/L (ref 19–32)
Calcium: 8.6 mg/dL (ref 8.4–10.5)
Chloride: 108 mEq/L (ref 96–112)
Creatinine, Ser: 0.86 mg/dL (ref 0.40–1.20)
GFR: 67.57 mL/min (ref >60.00)
Glucose, Bld: 102 mg/dL — ABNORMAL HIGH (ref 70–99)
Potassium: 3.8 mEq/L (ref 3.5–5.1)
Sodium: 143 mEq/L (ref 135–145)

## 2023-04-30 LAB — MICROALBUMIN / CREATININE URINE RATIO
Creatinine,U: 47.7 mg/dL
Microalb Creat Ratio: 13.9 mg/g (ref 0.0–30.0)
Microalb, Ur: 6.6 mg/dL — ABNORMAL HIGH (ref 0.0–1.9)

## 2023-04-30 LAB — LIPID PANEL
Cholesterol: 125 mg/dL (ref 0–200)
HDL: 59.1 mg/dL (ref >39.00)
LDL Cholesterol: 53 mg/dL (ref 0–99)
NonHDL: 65.67
Total CHOL/HDL Ratio: 2
Triglycerides: 63 mg/dL (ref 0.0–149.0)
VLDL: 12.6 mg/dL (ref 0.0–40.0)

## 2023-04-30 LAB — HEPATIC FUNCTION PANEL
ALT: 15 U/L (ref 0–35)
AST: 23 U/L (ref 0–37)
Albumin: 3.6 g/dL (ref 3.5–5.2)
Alkaline Phosphatase: 99 U/L (ref 39–117)
Bilirubin, Direct: 0.1 mg/dL (ref 0.0–0.3)
Total Bilirubin: 0.4 mg/dL (ref 0.2–1.2)
Total Protein: 5.9 g/dL — ABNORMAL LOW (ref 6.0–8.3)

## 2023-04-30 LAB — HEMOGLOBIN A1C: Hgb A1c MFr Bld: 6.5 % (ref 4.6–6.5)

## 2023-04-30 LAB — TSH: TSH: 3.28 u[IU]/mL (ref 0.35–5.50)

## 2023-05-03 ENCOUNTER — Ambulatory Visit (INDEPENDENT_AMBULATORY_CARE_PROVIDER_SITE_OTHER): Payer: PPO | Admitting: Primary Care

## 2023-05-03 ENCOUNTER — Encounter: Payer: Self-pay | Admitting: Primary Care

## 2023-05-03 VITALS — BP 130/78 | HR 72 | Temp 97.8°F | Ht 64.0 in | Wt 139.0 lb

## 2023-05-03 DIAGNOSIS — G4733 Obstructive sleep apnea (adult) (pediatric): Secondary | ICD-10-CM | POA: Diagnosis not present

## 2023-05-03 DIAGNOSIS — F5101 Primary insomnia: Secondary | ICD-10-CM | POA: Diagnosis not present

## 2023-05-03 DIAGNOSIS — G4730 Sleep apnea, unspecified: Secondary | ICD-10-CM | POA: Diagnosis not present

## 2023-05-03 MED ORDER — RAMELTEON 8 MG PO TABS: 8.0000 mg | ORAL_TABLET | Freq: Every day | ORAL | 1 refills | Status: DC

## 2023-05-03 NOTE — Patient Instructions (Addendum)
Look at getting wedge pillow for back sleepers with sleep apnea Aim to wear CPAP nightly for 4-6 hours or longer  Do not take gabapentin, tylenol pm or melatonin while using prescription sleep aid Start Rozerum 8mg  at bedtime (take 30-60 min before going to sleep. Do not drive while taking. If having significant daytime grogginess the next day notify office)  Orders: CPAP AUTO 5-15cm h20 with mask of choice (preferably nasal mask)  Follow-up: 6-8 week follow-up for CPAP compliance  Ramelteon Tablets What is this medication? RAMELTEON (ram EL tee on) treats insomnia. It helps you go to sleep faster. This medicine may be used for other purposes; ask your health care provider or pharmacist if you have questions. COMMON BRAND NAME(S): Rozerem What should I tell my care team before I take this medication? They need to know if you have any of these conditions: Liver disease Lung or breathing disease, such as asthma or COPD Mental health condition Substance use disorder Sleep apnea Suicidal thoughts, plans, or attempt by you or a family member An unusual or allergic reaction to ramelteon, other medications, foods, dyes, or preservatives Pregnant or trying to get pregnant Breast-feeding How should I use this medication? Take this medication by mouth with water. Take it as directed on the prescription label and only when you are ready for bed. Do not cut or break this medication. Swallow the tablets whole. Do not take it with or right after a meal. A special MedGuide will be given to you by the pharmacist with each prescription and refill. Be sure to read this information carefully each time. Talk to your care team about the use of this medication in children. It is not approved for use in children. Overdosage: If you think you have taken too much of this medicine contact a poison control center or emergency room at once. NOTE: This medicine is only for you. Do not share this medicine with  others. What if I miss a dose? This does not apply. This medication should only be taken immediately before going to sleep. Do not take double or extra doses. What may interact with this medication? Do not take this medication with any of the following: Fluvoxamine Melatonin Tasimelteon Viloxazine This medication may also interact with the following: Alcohol Certain medications for fungal infections like ketoconazole, fluconazole, or itraconazole Ciprofloxacin Donepezil Doxepin Other medications for sleep Rifampin This list may not describe all possible interactions. Give your health care provider a list of all the medicines, herbs, non-prescription drugs, or dietary supplements you use. Also tell them if you smoke, drink alcohol, or use illegal drugs. Some items may interact with your medicine. What should I watch for while using this medication? Visit your care team for regular checks on your progress. Tell your care team if your symptoms do not start to get better or if they get worse. Plan to go to bed and stay in bed for a full night (7 to 8 hours) after you take this medication. You may still be drowsy the morning after taking this medication. This medication may affect your coordination, reaction time, or judgment. Do not drive or operate machinery until you know how this medication affects you. Sit up or stand slowly to reduce the risk of dizzy or fainting spells. You may do unusual sleep behaviors or activities you do not remember the day after taking this medication. Activities include driving, making or eating food, talking on the phone, sexual activity, or sleep walking. Stop taking this medication  and call your care team right away if you find out you have done activities like this. If you or your family notice any changes in your behavior, such as new or worsening depression, thoughts of harming yourself, anxiety, other unusual or disturbing thoughts, or memory loss, call your  care team right away. After you stop taking this medication, you may have trouble falling asleep. This is called rebound insomnia. This problem usually goes away on its own after 1 or 2 nights. What side effects may I notice from receiving this medication? Side effects that you should report to your care team as soon as possible: Allergic reactions or angioedema--skin rash, itching, hives, swelling of the face, lips, tongue, arms, or legs, trouble swallowing or breathing High prolactin level--unexpected breast tissue growth, discharge from the nipple, change in sex drive or performance, irregular menstrual cycle Mood and behavior changes--anxiety, nervousness, confusion, hallucinations, irritability, hostility, thoughts of suicide or self-harm, worsening mood, feelings of depression Unusual sleep behaviors or activities you do not remember such as driving, eating, or sexual activity Side effects that usually do not require medical attention (report to your care team if they continue or are bothersome): Dizziness Drowsiness the day after use Fatigue This list may not describe all possible side effects. Call your doctor for medical advice about side effects. You may report side effects to FDA at 1-800-FDA-1088. Where should I keep my medication? Keep out of the reach of children and pets. Store at room temperature between 15 and 30 degrees C (59 and 86 degrees F). Protect from light and moisture. Keep the container tightly closed. Get rid of any unused medication after the expiration date. To get rid of medications that are no longer needed or have expired: Take the medication to a medication take-back program. Check with your pharmacy or law enforcement to find a location. If you cannot return the medication, check the label or package insert to see if the medication should be thrown out in the garbage or flushed down the toilet. If you are not sure, ask your care team. If it is safe to put it in  the trash, take the medication out of the container. Mix the medication with cat litter, dirt, coffee grounds, or other unwanted substance. Seal the mixture in a bag or container. Put it in the trash. NOTE: This sheet is a summary. It may not cover all possible information. If you have questions about this medicine, talk to your doctor, pharmacist, or health care provider.  2024 Elsevier/Gold Standard (2021-05-26 00:00:00)  CPAP and BIPAP Information CPAP and BIPAP are methods that use air pressure to keep your airways open and to help you breathe well. CPAP and BIPAP use different amounts of pressure. Your health care provider will tell you whether CPAP or BIPAP would be more helpful for you. CPAP stands for "continuous positive airway pressure." With CPAP, the amount of pressure stays the same while you breathe in (inhale) and out (exhale). BIPAP stands for "bi-level positive airway pressure." With BIPAP, the amount of pressure will be higher when you inhale and lower when you exhale. This allows you to take larger breaths. CPAP or BIPAP may be used in the hospital, or your health care provider may want you to use it at home. You may need to have a sleep study before your health care provider can order a machine for you to use at home. What are the advantages? CPAP or BIPAP can be helpful if you have: Sleep apnea.  Chronic obstructive pulmonary disease (COPD). Heart failure. Medical conditions that cause muscle weakness, including muscular dystrophy or amyotrophic lateral sclerosis (ALS). Other problems that cause breathing to be shallow, weak, abnormal, or difficult. CPAP and BIPAP are most commonly used for obstructive sleep apnea (OSA) to keep the airways from collapsing when the muscles relax during sleep. What are the risks? Generally, this is a safe treatment. However, problems may occur, including: Irritated skin or skin sores if the mask does not fit properly. Dry or stuffy nose or  nosebleeds. Dry mouth. Feeling gassy or bloated. Sinus or lung infection if the equipment is not cleaned properly. When should CPAP or BIPAP be used? In most cases, the mask only needs to be worn during sleep. Generally, the mask needs to be worn throughout the night and during any daytime naps. People with certain medical conditions may also need to wear the mask at other times, such as when they are awake. Follow instructions from your health care provider about when to use the machine. What happens during CPAP or BIPAP?  Both CPAP and BIPAP are provided by a small machine with a flexible plastic tube that attaches to a plastic mask that you wear. Air is blown through the mask into your nose or mouth. The amount of pressure that is used to blow the air can be adjusted on the machine. Your health care provider will set the pressure setting and help you find the best mask for you. Tips for using the mask Because the mask needs to be snug, some people feel trapped or closed-in (claustrophobic) when first using the mask. If you feel this way, you may need to get used to the mask. One way to do this is to hold the mask loosely over your nose or mouth and then gradually apply the mask more snugly. You can also gradually increase the amount of time that you use the mask. Masks are available in various types and sizes. If your mask does not fit well, talk with your health care provider about getting a different one. Some common types of masks include: Full face masks, which fit over the mouth and nose. Nasal masks, which fit over the nose. Nasal pillow or prong masks, which fit into the nostrils. If you are using a mask that fits over your nose and you tend to breathe through your mouth, a chin strap may be applied to help keep your mouth closed. Use a skin barrier to protect your skin as told by your health care provider. Some CPAP and BIPAP machines have alarms that may sound if the mask comes off or  develops a leak. If you have trouble with the mask, it is very important that you talk with your health care provider about finding a way to make the mask easier to tolerate. Do not stop using the mask. There could be a negative impact on your health if you stop using the mask. Tips for using the machine Place your CPAP or BIPAP machine on a secure table or stand near an electrical outlet. Know where the on/off switch is on the machine. Follow instructions from your health care provider about how to set the pressure on your machine and when you should use it. Do not eat or drink while the CPAP or BIPAP machine is on. Food or fluids could get pushed into your lungs by the pressure of the CPAP or BIPAP. For home use, CPAP and BIPAP machines can be rented or purchased through  home health care companies. Many different brands of machines are available. Renting a machine before purchasing may help you find out which particular machine works well for you. Your health insurance company may also decide which machine you may get. Keep the CPAP or BIPAP machine and attachments clean. Ask your health care provider for specific instructions. Check the humidifier if you have a dry stuffy nose or nosebleeds. Make sure it is working correctly. Follow these instructions at home: Take over-the-counter and prescription medicines only as told by your health care provider. Ask if you can take sinus medicine if your sinuses are blocked. Do not use any products that contain nicotine or tobacco. These products include cigarettes, chewing tobacco, and vaping devices, such as e-cigarettes. If you need help quitting, ask your health care provider. Keep all follow-up visits. This is important. Contact a health care provider if: You have redness or pressure sores on your head, face, mouth, or nose from the mask or head gear. You have trouble using the CPAP or BIPAP machine. You cannot tolerate wearing the CPAP or BIPAP  mask. Someone tells you that you snore even when wearing your CPAP or BIPAP. Get help right away if: You have trouble breathing. You feel confused. Summary CPAP and BIPAP are methods that use air pressure to keep your airways open and to help you breathe well. If you have trouble with the mask, it is very important that you talk with your health care provider about finding a way to make the mask easier to tolerate. Do not stop using the mask. There could be a negative impact to your health if you stop using the mask. Follow instructions from your health care provider about when to use the machine. This information is not intended to replace advice given to you by your health care provider. Make sure you discuss any questions you have with your health care provider. Document Revised: 05/28/2021 Document Reviewed: 09/27/2020 Elsevier Patient Education  2023 [employer].

## 2023-05-03 NOTE — Progress Notes (Signed)
@Patient  ID: Ashley Gross, female    DOB: 09/17/1951, 72 y.o.   MRN: [id number]  Chief Complaint  Patient presents with   Follow-up    Sleep study results    Referring provider: Dale Nogal, MD  HPI: 72 year old female, never smoked.  Past medical history significant for hypertension, brain aneurysm, mild intermittent asthma, GERD, pituitary lesion, type 2 diabetes, psoriatic arthritis, sleep difficulties, vitamin D deficiency.  Previous [facility] pulmonary encounter: 03/30/2023 Patient presents today for sleep consult.  She reports symptoms of restless sleep. She has been having more difficulty sleeping last 2 years since her husband passed away. She was accustom to living along and is now living with her daughter and her husband. She is not particularly happy with her living situation. She goes to her bedroom around 7-7:30pm. She has a hard time falling and staying asleep. She does not snore. She has woken up gasping for breath when she has had an upper respiratory infection. Typical bedtime is 8:30pm. It will take her around 2 hours to fall asleep. She wakes up every 1.5-2 hours. She starts her day at 5am. Weight is up 20 lbs. She had a sleep study over 10 years ago. She does not wear CPAP or oxygen. She takes 600mg  gabapentin and 10mg  melatonin for sleep. This regimen sometimes works and other times it does not. Neurology prescribes Neurontin for sleep.  She will get sleepy at work when on computer but does not fall asleep.    05/03/2023- Interim hx  Patient presents today to review sleep study results. She was seen for sleep consult in May for restless sleep and daytime sleepiness. She takes Neurontin, tylenol pm and melatonin without much improvement in insomnia. She has difficulty falling and staying asleep. She primary sleeps on her back. She is unable to sleep on her side due to shoulder pain. HST 04/20/23 showed severe OSA, AHI 33.8/hour with SpO2 low 76%. We reviewed sleep study results  and treatment options, starting auto CPAP due to severity of her sleep apnea.   Allergies  Allergen Reactions   Aspirin Other (See Comments)    Increased bleeding   Beta Adrenergic Blockers     3rd degree blockage    Lisinopril Cough   Mtx Support [Cobalamine Combinations] Other (See Comments)    Respiratory symptoms   Nsaids    Vasotec [Enalapril] Cough   Verapamil Other (See Comments)    Heart block   Voltaren [Diclofenac Sodium]     Pt unsure of reaction    Erythromycin Other (See Comments)    Gi intolerance   Indocin [Indomethacin]     Upset stomach   Jardiance [Empagliflozin] Other (See Comments)    Recurrent yeast infections, even with washout and rechallenge    Immunization History  Administered Date(s) Administered   Fluad Quad(high Dose 65+) 07/20/2019, 07/23/2020, 07/24/2021, 08/13/2022   Influenza Whole 07/03/2012   Influenza, High Dose Seasonal PF 07/12/2017, 08/01/2018   Influenza, Seasonal, Injecte, Preservative Fre 09/04/2013   Influenza-Unspecified 07/17/2014, 07/31/2015, 07/15/2016   PFIZER Comirnaty(Gray Top)Covid-19 Tri-Sucrose Vaccine 03/25/2021   PFIZER(Purple Top)SARS-COV-2 Vaccination 12/13/2019, 01/03/2020, 08/15/2020   Pneumococcal Conjugate-13 05/17/2017   Pneumococcal Polysaccharide-23 11/13/2020    Past Medical History:  Diagnosis Date   Abnormal liver function test 10/06/2012   Anemia 01/25/2022   Anemia, unspecified    Asthma    B12 deficiency 02/11/2019   Bleeding internal hemorrhoids 09/13/2017   Choledocholithiasis 06/29/2012   Formatting of this note might be different from the original. Dilated  CBD on abdominal imaging 04/2012   Chronic diarrhea 02/16/2013   Colitis    Complication of anesthesia    asthma attack after shoulder surgery   Diabetes mellitus (HCC)    type II   Esophagitis    GERD (gastroesophageal reflux disease)    Heart murmur    for years- nothing to be concerned about.   History of kidney stones    Hx of  arteriovenous malformation (AVM)    Hypertension    IBS (irritable bowel syndrome)    Loss of weight    Pernicious anemia    Personal history of colonic polyps 02/10/2013   02/08/13 colonoscopy - question of rectal varices, two 3-41mm polyps in the sigmoid colon, mass at the hepatic flexure, one 5mm polyp at the hepatic flexure, nodule at the ileocecal valve, congested mucusa in the entire colon, flattened villi mucosa in the terminal ileum.     Pneumonia    Psoriatic arthritis (HCC)    s/p penicillamine, plaquenil, MTX, sulfasalazine.  s/p Embrel, Humira,.  Iritis.     Pure hypercholesterolemia    Rectal bleeding 07/15/2017   Stroke (HCC) 05/29/2022   per patient    Tobacco History: Social History   Tobacco Use  Smoking Status Never   Passive exposure: Never  Smokeless Tobacco Never   Counseling given: Not Answered   Outpatient Medications Prior to Visit  Medication Sig Dispense Refill   acetaminophen (TYLENOL) 500 MG tablet Take 2 tablets (1,000 mg total) by mouth every 6 (six) hours as needed for mild pain.     albuterol (VENTOLIN HFA) 108 (90 Base) MCG/ACT inhaler Inhale 2 puffs into the lungs every 6 (six) hours as needed for wheezing or shortness of breath.     amLODipine (NORVASC) 5 MG tablet TAKE 1 TABLET(5 MG) BY MOUTH TWICE DAILY 180 tablet 1   budesonide-formoterol (SYMBICORT) 160-4.5 MCG/ACT inhaler Inhale 2 puffs into the lungs 2 (two) times daily as needed (shortness of breath). 30.6 g 3   Cholecalciferol (VITAMIN D) 50 MCG (2000 UT) tablet Take 2,000 Units by mouth daily.     CREON 36000-114000 units CPEP capsule Take 1-2 capsules (36,000-72,000 Units total) by mouth See admin instructions. Take 720000 units with each meal and 16109 units with snacks 240 capsule 3   diphenhydramine-acetaminophen (TYLENOL PM) 25-500 MG TABS tablet Take 2 tablets by mouth at bedtime as needed (sleep).     fluticasone-salmeterol (ADVAIR DISKUS) 100-50 MCG/ACT AEPB Inhale 1 puff into the  lungs 2 (two) times daily. 1 each 3   gabapentin (NEURONTIN) 600 MG tablet Take 600 mg by mouth daily.     insulin degludec (TRESIBA FLEXTOUCH) 100 UNIT/ML FlexTouch Pen Inject 16 Units into the skin daily. 15 mL 1   ipratropium-albuterol (DUONEB) 0.5-2.5 (3) MG/3ML SOLN Take 3 mLs by nebulization every 4 (four) hours as needed. 360 mL 0   loperamide (IMODIUM) 2 MG capsule Take 4 mg by mouth daily.     losartan (COZAAR) 100 MG tablet TAKE 1 TABLET(100 MG) BY MOUTH AT BEDTIME 90 tablet 1   mometasone (ELOCON) 0.1 % ointment Apply 1 Application topically 2 (two) times daily.     omeprazole (PRILOSEC) 20 MG capsule TAKE 1 CAPSULE(20 MG) BY MOUTH TWICE DAILY 180 capsule 3   ONE TOUCH ULTRA TEST test strip PATIENT NEEDS NEW METER STRIPS AND LANCETS FOR ONE TOUCH. HER INSURANCE NO LONGER COVERS ACCUCHEK. 100 each PRN   Probiotic Product (PROBIOTIC DAILY PO) Take 1 capsule by mouth daily.  rosuvastatin (CRESTOR) 10 MG tablet TAKE 1 TABLET(10 MG) BY MOUTH DAILY 90 tablet 3   Melatonin 10 MG TABS Take 10 mg by mouth at bedtime. (Patient not taking: Reported on 04/05/2023)     No facility-administered medications prior to visit.    Review of Systems  Review of Systems  Constitutional:  Positive for fatigue.  Psychiatric/Behavioral:  Positive for sleep disturbance.      Physical Exam  BP 130/78 (BP Location: Left Arm, Cuff Size: Normal)   Pulse 72   Temp 97.8 F (36.6 C)   Ht 5\' 4"  (1.626 m)   Wt 139 lb (63 kg)   SpO2 97%   BMI 23.86 kg/m  Physical Exam Constitutional:      Appearance: Normal appearance.  HENT:     Head: Normocephalic and atraumatic.  Cardiovascular:     Rate and Rhythm: Normal rate.  Pulmonary:     Effort: Pulmonary effort is normal.     Breath sounds: Normal breath sounds.  Skin:    General: Skin is warm and dry.  Neurological:     General: No focal deficit present.     Mental Status: She is alert and oriented to person, place, and time. Mental status is at  baseline.      Lab Results:  CBC    Component Value Date/Time   WBC 9.4 03/02/2023 1154   RBC 4.01 03/02/2023 1154   HGB 11.3 (L) 03/02/2023 1154   HGB 12.2 12/28/2018 1203   HCT 34.8 (L) 03/02/2023 1154   HCT 37.6 12/28/2018 1203   PLT 244.0 03/02/2023 1154   PLT 291 12/28/2018 1203   MCV 86.7 03/02/2023 1154   MCV 91 12/28/2018 1203   MCH 27.8 02/19/2023 0519   MCHC 32.6 03/02/2023 1154   RDW 14.9 03/02/2023 1154   RDW 13.6 12/28/2018 1203   LYMPHSABS 1.8 03/02/2023 1154   MONOABS 0.7 03/02/2023 1154   EOSABS 0.2 03/02/2023 1154   BASOSABS 0.0 03/02/2023 1154    BMET    Component Value Date/Time   NA 143 04/30/2023 0802   K 3.8 04/30/2023 0802   K 4.1 06/12/2013 1657   CL 108 04/30/2023 0802   CO2 29 04/30/2023 0802   GLUCOSE 102 (H) 04/30/2023 0802   BUN 16 04/30/2023 0802   BUN 15 07/17/2021 1635   CREATININE 0.86 04/30/2023 0802   CREATININE 1.00 (H) 11/01/2018 1035   CALCIUM 8.6 04/30/2023 0802   GFRNONAA >60 02/19/2023 0519   GFRAA >60 11/28/2019 0953    BNP    Component Value Date/Time   BNP 192.1 (H) 07/02/2022 1144    ProBNP No results found for: "PROBNP"  Imaging: No results found.   Assessment & Plan:   No problem-specific Assessment & Plan notes found for this encounter.     Glenford Bayley, NP 05/03/2023

## 2023-05-04 DIAGNOSIS — Z961 Presence of intraocular lens: Secondary | ICD-10-CM | POA: Diagnosis not present

## 2023-05-04 DIAGNOSIS — E119 Type 2 diabetes mellitus without complications: Secondary | ICD-10-CM | POA: Diagnosis not present

## 2023-05-04 LAB — HM DIABETES EYE EXAM

## 2023-05-04 NOTE — Progress Notes (Signed)
Contacted patient. Left voicemail asking to call Joni Reining about proof of income. Will also send MyChart.   [name] Eppie Gibson, PharmD, BCACP, CPP Clinical Pharmacist [facility] [phone]

## 2023-05-04 NOTE — Telephone Encounter (Signed)
Contacted patient. Encouraged to

## 2023-05-05 ENCOUNTER — Telehealth: Payer: Self-pay | Admitting: Primary Care

## 2023-05-05 NOTE — Telephone Encounter (Signed)
Patient states Ramelteon is too expensive. Pharmacy is [facility] [place]. Patient phone number is [phone].

## 2023-05-05 NOTE — Telephone Encounter (Signed)
Spoke to patient.  She stated that Rozerem is not affordable. It's 100 dollars for a 30 day supply. Pharmacy team, can you assist with this? Is there a cheaper alternative?

## 2023-05-07 ENCOUNTER — Ambulatory Visit (INDEPENDENT_AMBULATORY_CARE_PROVIDER_SITE_OTHER): Payer: PPO | Admitting: Internal Medicine

## 2023-05-07 ENCOUNTER — Encounter: Payer: Self-pay | Admitting: Internal Medicine

## 2023-05-07 VITALS — BP 122/70 | HR 76 | Temp 97.9°F | Resp 16 | Ht 64.0 in | Wt 140.6 lb

## 2023-05-07 DIAGNOSIS — F320 Major depressive disorder, single episode, mild: Secondary | ICD-10-CM

## 2023-05-07 DIAGNOSIS — E1165 Type 2 diabetes mellitus with hyperglycemia: Secondary | ICD-10-CM

## 2023-05-07 DIAGNOSIS — I10 Essential (primary) hypertension: Secondary | ICD-10-CM | POA: Diagnosis not present

## 2023-05-07 DIAGNOSIS — D329 Benign neoplasm of meninges, unspecified: Secondary | ICD-10-CM

## 2023-05-07 DIAGNOSIS — I729 Aneurysm of unspecified site: Secondary | ICD-10-CM | POA: Diagnosis not present

## 2023-05-07 DIAGNOSIS — K219 Gastro-esophageal reflux disease without esophagitis: Secondary | ICD-10-CM | POA: Diagnosis not present

## 2023-05-07 DIAGNOSIS — D509 Iron deficiency anemia, unspecified: Secondary | ICD-10-CM

## 2023-05-07 DIAGNOSIS — Z794 Long term (current) use of insulin: Secondary | ICD-10-CM | POA: Diagnosis not present

## 2023-05-07 DIAGNOSIS — K51419 Inflammatory polyps of colon with unspecified complications: Secondary | ICD-10-CM

## 2023-05-07 DIAGNOSIS — Z8673 Personal history of transient ischemic attack (TIA), and cerebral infarction without residual deficits: Secondary | ICD-10-CM

## 2023-05-07 DIAGNOSIS — E7800 Pure hypercholesterolemia, unspecified: Secondary | ICD-10-CM | POA: Diagnosis not present

## 2023-05-07 DIAGNOSIS — J4520 Mild intermittent asthma, uncomplicated: Secondary | ICD-10-CM | POA: Diagnosis not present

## 2023-05-07 DIAGNOSIS — L4050 Arthropathic psoriasis, unspecified: Secondary | ICD-10-CM

## 2023-05-07 DIAGNOSIS — S30817A Abrasion of anus, initial encounter: Secondary | ICD-10-CM

## 2023-05-07 DIAGNOSIS — E237 Disorder of pituitary gland, unspecified: Secondary | ICD-10-CM

## 2023-05-07 DIAGNOSIS — I671 Cerebral aneurysm, nonruptured: Secondary | ICD-10-CM

## 2023-05-07 DIAGNOSIS — Z1231 Encounter for screening mammogram for malignant neoplasm of breast: Secondary | ICD-10-CM

## 2023-05-07 NOTE — Progress Notes (Unsigned)
Subjective:    Patient ID: Ashley Gross, female    DOB: October 05, 1951, 72 y.o.   MRN: [id number]  Patient here for  Chief Complaint  Patient presents with   Medical Management of Chronic Issues    HPI Here to follow up regarding diabetes and hypertension. Had been having problems with perianal excoriation. Saw surgery. Trying to keep the area dry. Also saw dermatology. Diagnosed with psoriasis.  Given a topical cream.  Pain and discomfort better.  She has f/u with GI in 06/2023.  Plans to discuss.  Tries to stay active.  No chest pain or sob. Seeing GI. entyvio treatments for inflammatory polyposis. Bleeding overall is better. Seeing hematology - iron infusions. Saw pulmonary for evaluation - sleep apnea.  HST - severe OSA.  Recommended cpap.  Also placed on rozerum. Unable to get due to cost.     Past Medical History:  Diagnosis Date   Abnormal liver function test 10/06/2012   Anemia 01/25/2022   Anemia, unspecified    Asthma    B12 deficiency 02/11/2019   Bleeding internal hemorrhoids 09/13/2017   Choledocholithiasis 06/29/2012   Formatting of this note might be different from the original. Dilated CBD on abdominal imaging 04/2012   Chronic diarrhea 02/16/2013   Colitis    Complication of anesthesia    asthma attack after shoulder surgery   Diabetes mellitus (HCC)    type II   Esophagitis    GERD (gastroesophageal reflux disease)    Heart murmur    for years- nothing to be concerned about.   History of kidney stones    Hx of arteriovenous malformation (AVM)    Hypertension    IBS (irritable bowel syndrome)    Loss of weight    Pernicious anemia    Personal history of colonic polyps 02/10/2013   02/08/13 colonoscopy - question of rectal varices, two 3-73mm polyps in the sigmoid colon, mass at the hepatic flexure, one 5mm polyp at the hepatic flexure, nodule at the ileocecal valve, congested mucusa in the entire colon, flattened villi mucosa in the terminal ileum.      Pneumonia    Psoriatic arthritis (HCC)    s/p penicillamine, plaquenil, MTX, sulfasalazine.  s/p Embrel, Humira,.  Iritis.     Pure hypercholesterolemia    Rectal bleeding 07/15/2017   Stroke (HCC) 05/29/2022   per patient   Past Surgical History:  Procedure Laterality Date   ABDOMINAL HYSTERECTOMY  1981   ovaries left in place   ANKLE SURGERY     right   BUNIONECTOMY     CHOLECYSTECTOMY  1989   COLONOSCOPY WITH PROPOFOL N/A 12/27/2017   Procedure: COLONOSCOPY WITH PROPOFOL;  Surgeon: Toney Reil, MD;  Location: [facility] ENDOSCOPY;  Service: Gastroenterology;  Laterality: N/A;   COLONOSCOPY WITH PROPOFOL N/A 01/04/2019   Procedure: COLONOSCOPY WITH PROPOFOL;  Surgeon: Toney Reil, MD;  Location: [facility] SURGERY CNTR;  Service: Endoscopy;  Laterality: N/A;  Diabetic - oral and injectable   COLONOSCOPY WITH PROPOFOL N/A 07/24/2021   Procedure: COLONOSCOPY WITH PROPOFOL;  Surgeon: Toney Reil, MD;  Location: [facility] SURGERY CNTR;  Service: Endoscopy;  Laterality: N/A;  Diabetic   ESOPHAGOGASTRODUODENOSCOPY (EGD) WITH PROPOFOL N/A 12/27/2017   Procedure: ESOPHAGOGASTRODUODENOSCOPY (EGD) WITH PROPOFOL;  Surgeon: Toney Reil, MD;  Location: [facility] ENDOSCOPY;  Service: Gastroenterology;  Laterality: N/A;   HAND SURGERY     right   HEEL SPUR SURGERY     IR 3D INDEPENDENT WKST  10/01/2021  IR ANGIO INTRA EXTRACRAN SEL COM CAROTID INNOMINATE UNI L MOD SED  10/01/2021   IR ANGIO INTRA EXTRACRAN SEL INTERNAL CAROTID UNI R MOD SED  10/01/2021   IR ANGIO INTRA EXTRACRAN SEL INTERNAL CAROTID UNI R MOD SED  05/29/2022   IR ANGIO VERTEBRAL SEL VERTEBRAL BILAT MOD SED  10/01/2021   IR ANGIO VERTEBRAL SEL VERTEBRAL UNI R MOD SED  05/29/2022   IR CT HEAD LTD  10/01/2021   IR TRANSCATH/EMBOLIZ  10/01/2021   IR US GUIDE VASC ACCESS RIGHT  10/01/2021   IR US GUIDE VASC ACCESS RIGHT  05/29/2022   NOSE SURGERY     x2   POLYPECTOMY  01/04/2019   Procedure: POLYPECTOMY;  Surgeon: Toney Reil, MD;  Location: [facility] SURGERY [facility];  Service: Endoscopy;;   POLYPECTOMY N/A 07/24/2021   Procedure: POLYPECTOMY;  Surgeon: Toney Reil, MD;  Location: [facility] SURGERY CNTR;  Service: Endoscopy;  Laterality: N/A;   RADIOLOGY WITH ANESTHESIA N/A 10/01/2021   Procedure: Alfredia Client WITH ANESTHESIA;  Surgeon: Baldemar Lenis, MD;  Location: [facility] OR;  Service: Radiology;  Laterality: N/A;   SHOULDER ARTHROSCOPY WITH OPEN ROTATOR CUFF REPAIR Left 09/20/2018   Procedure: SHOULDER ARTHROSCOPY WITH OPEN ROTATOR CUFF REPAIR;  Surgeon: Christena Flake, MD;  Location: [facility] ORS;  Service: Orthopedics;  Laterality: Left;   SHOULDER CLOSED REDUCTION Left 12/21/2018   Procedure: MANIPULATION UNDER ANESTHESIA WITH STEROID INJECTION;  Surgeon: Christena Flake, MD;  Location: [facility] SURGERY CNTR;  Service: Orthopedics;  Laterality: Left;  Diabetic - insulin and oral meds   UMBILICAL HERNIA REPAIR     XI ROBOTIC ASSISTED INGUINAL HERNIA REPAIR WITH MESH Left 12/16/2021   Procedure: XI ROBOTIC ASSISTED INGUINAL HERNIA REPAIR WITH MESH;  Surgeon: [name], MD;  Location: [facility] ORS;  Service: General;  Laterality: Left;   Family History  Problem Relation Age of Onset   Diabetes Other    Hypertension Other    Breast cancer Neg Hx    Colon cancer Neg Hx    Social History   Socioeconomic History   Marital status: Widowed    Spouse name: Not on file   Number of children: 2   Years of education: Not on file   Highest education level: Associate degree: occupational, Scientist, product/process development, or vocational program  Occupational History   Not on file  Tobacco Use   Smoking status: Never    Passive exposure: Never   Smokeless tobacco: Never  Vaping Use   Vaping Use: Never used  Substance and Sexual Activity   Alcohol use: Never   Drug use: Never   Sexual activity: Not Currently  Other Topics Concern   Not on file  Social History Narrative   She is married and has two children   Grand  daughter lives with her.   Social Determinants of Health   Financial Resource Strain: Low Risk  (03/11/2023)   Overall Financial Resource Strain (CARDIA)    Difficulty of Paying Living Expenses: Not very hard  Food Insecurity: No Food Insecurity (03/11/2023)   Hunger Vital Sign    Worried About Running Out of Food in the Last Year: Never true    Ran Out of Food in the Last Year: Never true  Transportation Needs: No Transportation Needs (03/11/2023)   PRAPARE - Administrator, Civil Service (Medical): No    Lack of Transportation (Non-Medical): No  Physical Activity: Unknown (03/11/2023)   Exercise Vital Sign    Days of Exercise per  Week: 0 days    Minutes of Exercise per Session: Not on file  Stress: Stress Concern Present (03/11/2023)   [employer] of Occupational Health - Occupational Stress Questionnaire    Feeling of Stress : Very much  Social Connections: Moderately Integrated (03/11/2023)   Social Connection and Isolation Panel [NHANES]    Frequency of Communication with Friends and Family: More than three times a week    Frequency of Social Gatherings with Friends and Family: More than three times a week    Attends Religious Services: More than 4 times per year    Active Member of [employer] or Organizations: Yes    Attends Banker Meetings: More than 4 times per year    Marital Status: Widowed     Review of Systems  Constitutional:  Negative for appetite change and unexpected weight change.  HENT:  Negative for congestion and sinus pressure.   Respiratory:  Negative for cough, chest tightness and shortness of breath.   Cardiovascular:  Negative for chest pain, palpitations and leg swelling.  Gastrointestinal:  Positive for blood in stool. Negative for abdominal pain, nausea and vomiting.  Genitourinary:  Negative for difficulty urinating and dysuria.  Musculoskeletal:  Negative for joint swelling and myalgias.  Skin:  Negative for color change and rash.   Neurological:  Negative for dizziness and headaches.  Psychiatric/Behavioral:         Increased stress.  Living with her daughter and her husband.         Objective:     BP 122/70   Pulse 76   Temp 97.9 F (36.6 C)   Resp 16   Ht 5\' 4"  (1.626 m)   Wt 140 lb 9.6 oz (63.8 kg)   SpO2 98%   BMI 24.13 kg/m  Wt Readings from Last 3 Encounters:  05/07/23 140 lb 9.6 oz (63.8 kg)  05/03/23 139 lb (63 kg)  03/30/23 140 lb 3.2 oz (63.6 kg)    Physical Exam Vitals reviewed.  Constitutional:      General: She is not in acute distress.    Appearance: Normal appearance.  HENT:     Head: Normocephalic and atraumatic.     Right Ear: External ear normal.     Left Ear: External ear normal.  Eyes:     General: No scleral icterus.       Right eye: No discharge.        Left eye: No discharge.     Conjunctiva/sclera: Conjunctivae normal.  Neck:     Thyroid: No thyromegaly.  Cardiovascular:     Rate and Rhythm: Normal rate and regular rhythm.  Pulmonary:     Effort: No respiratory distress.     Breath sounds: Normal breath sounds. No wheezing.  Abdominal:     General: Bowel sounds are normal.     Palpations: Abdomen is soft.     Tenderness: There is no abdominal tenderness.  Musculoskeletal:        General: No swelling or tenderness.     Cervical back: Neck supple. No tenderness.  Lymphadenopathy:     Cervical: No cervical adenopathy.  Skin:    Findings: No erythema or rash.  Neurological:     Mental Status: She is alert.  Psychiatric:        Mood and Affect: Mood normal.        Behavior: Behavior normal.      Outpatient Encounter Medications as of 05/07/2023  Medication Sig   acetaminophen (TYLENOL) 500  MG tablet Take 2 tablets (1,000 mg total) by mouth every 6 (six) hours as needed for mild pain.   albuterol (VENTOLIN HFA) 108 (90 Base) MCG/ACT inhaler Inhale 2 puffs into the lungs every 6 (six) hours as needed for wheezing or shortness of breath.   amLODipine  (NORVASC) 5 MG tablet TAKE 1 TABLET(5 MG) BY MOUTH TWICE DAILY   budesonide-formoterol (SYMBICORT) 160-4.5 MCG/ACT inhaler Inhale 2 puffs into the lungs 2 (two) times daily as needed (shortness of breath).   Cholecalciferol (VITAMIN D) 50 MCG (2000 UT) tablet Take 2,000 Units by mouth daily.   CREON 36000-114000 units CPEP capsule Take 1-2 capsules (36,000-72,000 Units total) by mouth See admin instructions. Take 720000 units with each meal and 16109 units with snacks   diphenhydramine-acetaminophen (TYLENOL PM) 25-500 MG TABS tablet Take 2 tablets by mouth at bedtime as needed (sleep).   fluticasone-salmeterol (ADVAIR DISKUS) 100-50 MCG/ACT AEPB Inhale 1 puff into the lungs 2 (two) times daily.   gabapentin (NEURONTIN) 600 MG tablet Take 600 mg by mouth daily.   insulin degludec (TRESIBA FLEXTOUCH) 100 UNIT/ML FlexTouch Pen Inject 16 Units into the skin daily.   ipratropium-albuterol (DUONEB) 0.5-2.5 (3) MG/3ML SOLN Take 3 mLs by nebulization every 4 (four) hours as needed.   loperamide (IMODIUM) 2 MG capsule Take 4 mg by mouth daily.   losartan (COZAAR) 100 MG tablet TAKE 1 TABLET(100 MG) BY MOUTH AT BEDTIME   mometasone (ELOCON) 0.1 % ointment Apply 1 Application topically 2 (two) times daily.   omeprazole (PRILOSEC) 20 MG capsule TAKE 1 CAPSULE(20 MG) BY MOUTH TWICE DAILY   ONE TOUCH ULTRA TEST test strip PATIENT NEEDS NEW METER STRIPS AND LANCETS FOR ONE TOUCH. HER INSURANCE NO LONGER COVERS ACCUCHEK.   Probiotic Product (PROBIOTIC DAILY PO) Take 1 capsule by mouth daily.   ramelteon (ROZEREM) 8 MG tablet Take 1 tablet (8 mg total) by mouth at bedtime.   rosuvastatin (CRESTOR) 10 MG tablet TAKE 1 TABLET(10 MG) BY MOUTH DAILY   [DISCONTINUED] Melatonin 10 MG TABS Take 10 mg by mouth at bedtime. (Patient not taking: Reported on 04/05/2023)   No facility-administered encounter medications on file as of 05/07/2023.     Lab Results  Component Value Date   WBC 9.4 03/02/2023   HGB 11.3 (L)  03/02/2023   HCT 34.8 (L) 03/02/2023   PLT 244.0 03/02/2023   GLUCOSE 102 (H) 04/30/2023   CHOL 125 04/30/2023   TRIG 63.0 04/30/2023   HDL 59.10 04/30/2023   LDLDIRECT 74.0 04/19/2019   LDLCALC 53 04/30/2023   ALT 15 04/30/2023   AST 23 04/30/2023   NA 143 04/30/2023   K 3.8 04/30/2023   CL 108 04/30/2023   CREATININE 0.86 04/30/2023   BUN 16 04/30/2023   CO2 29 04/30/2023   TSH 3.28 04/30/2023   INR 1.1 02/19/2023   HGBA1C 6.5 04/30/2023   MICROALBUR 6.6 (H) 04/30/2023    MR BRAIN W WO CONTRAST  Result Date: 05/05/2023 CLINICAL DATA:  Follow-up cerebral aneurysm.  Meningioma follow-up. EXAM: MRI HEAD WITHOUT AND WITH CONTRAST MRA HEAD WITHOUT CONTRAST TECHNIQUE: Multiplanar, multi-echo pulse sequences of the brain and surrounding structures were acquired without and with intravenous contrast. Angiographic images of the Circle of Willis were acquired using MRA technique without intravenous contrast. CONTRAST:  6mL GADAVIST GADOBUTROL 1 MMOL/ML IV SOLN COMPARISON:  05/30/2022 FINDINGS: MRI HEAD FINDINGS Brain: 8 x 4 mm meningioma along the right cerebral convexity at the level of the lower right frontal lobe is  unchanged and does not cause any cerebral mass effect. 8 mm pituitary micro adenoma in the right gland, best seen on axial FLAIR images. No detected change or invasive feature. Chronic lacunar infarct in the right pons. Small chronic right parietal cortically based infarct is stable. No acute hemorrhage, hydrocephalus, or collection. Vascular: Normal flow voids and vascular enhancement Skull and upper cervical spine: Normal marrow signal Sinuses/Orbits: Negative MRA HEAD FINDINGS Treated anterior communicating artery aneurysm. Upward convexity of the parent A-comm is unchanged and non worrisome on coronal reformats generated in PACS. No evidence of new or recurrent aneurysm. The vertebral, basilar, carotid, and major branch vessels are widely patent. IMPRESSION: Brain MRI: 1. Unchanged  8 x 4 mm meningioma along the right cerebral convexity. 2. Unchanged 8 mm pituitary micro adenoma. MRA: No evidence of new or recurrent aneurysm. Electronically Signed   By: Tiburcio Pea M.D.   On: 05/05/2023 13:06   MR ANGIO HEAD WO CONTRAST  Result Date: 05/05/2023 CLINICAL DATA:  Follow-up cerebral aneurysm.  Meningioma follow-up. EXAM: MRI HEAD WITHOUT AND WITH CONTRAST MRA HEAD WITHOUT CONTRAST TECHNIQUE: Multiplanar, multi-echo pulse sequences of the brain and surrounding structures were acquired without and with intravenous contrast. Angiographic images of the Circle of Willis were acquired using MRA technique without intravenous contrast. CONTRAST:  6mL GADAVIST GADOBUTROL 1 MMOL/ML IV SOLN COMPARISON:  05/30/2022 FINDINGS: MRI HEAD FINDINGS Brain: 8 x 4 mm meningioma along the right cerebral convexity at the level of the lower right frontal lobe is unchanged and does not cause any cerebral mass effect. 8 mm pituitary micro adenoma in the right gland, best seen on axial FLAIR images. No detected change or invasive feature. Chronic lacunar infarct in the right pons. Small chronic right parietal cortically based infarct is stable. No acute hemorrhage, hydrocephalus, or collection. Vascular: Normal flow voids and vascular enhancement Skull and upper cervical spine: Normal marrow signal Sinuses/Orbits: Negative MRA HEAD FINDINGS Treated anterior communicating artery aneurysm. Upward convexity of the parent A-comm is unchanged and non worrisome on coronal reformats generated in PACS. No evidence of new or recurrent aneurysm. The vertebral, basilar, carotid, and major branch vessels are widely patent. IMPRESSION: Brain MRI: 1. Unchanged 8 x 4 mm meningioma along the right cerebral convexity. 2. Unchanged 8 mm pituitary micro adenoma. MRA: No evidence of new or recurrent aneurysm. Electronically Signed   By: Tiburcio Pea M.D.   On: 05/05/2023 13:06       Assessment & Plan:  Visit for screening  mammogram -     3D Screening Mammogram, Left and Right; Future  Hypercholesterolemia Assessment & Plan: Continue crestor. Follow lipid panel and liver function tests.    Orders: -     Lipid panel; Future -     Hepatic function panel; Future -     Basic metabolic panel; Future  Type 2 diabetes mellitus with hyperglycemia, with long-term current use of insulin (HCC) Assessment & Plan: On insulin.  Follow sugars. Follow met b and A1c. Low carb diet and exercise. Have discussed titrating down insulin if low sugars.   Orders: -     Hemoglobin A1c; Future  Iron deficiency anemia, unspecified iron deficiency anemia type Assessment & Plan: Seeing GI.  entyvio treatments for inflammatory polyposis.  Seeing hematology - iron infusions.  Follow.    Aneurysm [facility]) Assessment & Plan: Saw Dr Myer Haff [facility] - 01/2023 - At this point, she is asymptomatic. He recommended follow-up imaging. His office will arrange for MRI scan of the brain as  well as MR angiogram of the brain in the summer. He will touch base with her via telephone after that is performed. If her findings are stable, she will likely follow with yearly imaging.    Aneurysm of anterior communicating artery Assessment & Plan: Saw Dr Myer Haff [facility] - 01/2023 - At this point, she is asymptomatic. He recommended follow-up imaging. His office will arrange for MRI scan of the brain as well as MR angiogram of the brain in the summer. He will touch base with her via telephone after that is performed. If her findings are stable, she will likely follow with yearly imaging.    Mild intermittent asthma without complication Assessment & Plan: Breathing stable.  No increased cough and congestion.  Continue symbicort.  Has duonebs if needed.    Depression, major, single episode, mild (HCC) Assessment & Plan: Discussed.  Overall appears to be handling things relatively well.  Will notify if feels needs further intervention.  Follow.     Gastroesophageal reflux disease, unspecified whether esophagitis present Assessment & Plan: Continue omeprazole. No increased acid reflux.     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  MRA head and neck were negative.  ECHO - no evidence of embolic source. With history of significant GI bleed in the past due to AVM, polyp bleed.     Primary hypertension Assessment & Plan: On amlodipine and losartan.  Follow pressures.  Follow metabolic panel.    Pseudopolyposis of colon with complication, unspecified part of colon [facility]) Assessment & Plan: S/p colonoscopy 07/24/21 as outlined.  Continues f/u with GI.  On entyvio. Keep f/u with GI.    Meningioma [facility]) Assessment & Plan: Followed by [facility].    Psoriatic arthritis (HCC) Assessment & Plan: Stable.  Has been followed by rheumatology.     Pituitary lesion [facility]) Assessment & Plan: Pituitary microadenoma -She was evaluated by neurology ([facility]) Recommended - MRI 6 months.  [facility] - 01/2023 - plan MRI/MRA   Perianal excoriation Assessment & Plan: Has seen surgery and dermatology.  Diagnosed with psoriasis.  The pain is much improved.  Plans to f/u with GI      Dale Searles, MD

## 2023-05-10 ENCOUNTER — Telehealth: Payer: Self-pay | Admitting: Neurosurgery

## 2023-05-10 ENCOUNTER — Encounter: Payer: Self-pay | Admitting: Internal Medicine

## 2023-05-10 DIAGNOSIS — L909 Atrophic disorder of skin, unspecified: Secondary | ICD-10-CM | POA: Diagnosis not present

## 2023-05-10 DIAGNOSIS — G4730 Sleep apnea, unspecified: Secondary | ICD-10-CM | POA: Insufficient documentation

## 2023-05-10 DIAGNOSIS — B351 Tinea unguium: Secondary | ICD-10-CM | POA: Diagnosis not present

## 2023-05-10 DIAGNOSIS — G4700 Insomnia, unspecified: Secondary | ICD-10-CM | POA: Insufficient documentation

## 2023-05-10 DIAGNOSIS — M778 Other enthesopathies, not elsewhere classified: Secondary | ICD-10-CM | POA: Diagnosis not present

## 2023-05-10 DIAGNOSIS — E237 Disorder of pituitary gland, unspecified: Secondary | ICD-10-CM

## 2023-05-10 DIAGNOSIS — G4733 Obstructive sleep apnea (adult) (pediatric): Secondary | ICD-10-CM | POA: Insufficient documentation

## 2023-05-10 DIAGNOSIS — I671 Cerebral aneurysm, nonruptured: Secondary | ICD-10-CM

## 2023-05-10 DIAGNOSIS — L600 Ingrowing nail: Secondary | ICD-10-CM | POA: Diagnosis not present

## 2023-05-10 NOTE — Assessment & Plan Note (Signed)
Saw Dr Yarbrough [facility] - 01/2023 - At this point, she is asymptomatic. He recommended follow-up imaging. His office will arrange for MRI scan of the brain as well as MR angiogram of the brain in the summer. He will touch base with her via telephone after that is performed. If her findings are stable, she will likely follow with yearly imaging.  

## 2023-05-10 NOTE — Progress Notes (Signed)
Reviewed and agree with assessment/plan.   Tesneem Dufrane, MD [place] [facility] 05/10/2023, 8:25 AM Pager:  [phone]  

## 2023-05-10 NOTE — Assessment & Plan Note (Signed)
Discussed.  Overall appears to be handling things relatively well.  Will notify if feels needs further intervention.  Follow.  

## 2023-05-10 NOTE — Assessment & Plan Note (Signed)
Seeing GI.  entyvio treatments for inflammatory polyposis.  Seeing hematology - iron infusions.  Follow.  

## 2023-05-10 NOTE — Telephone Encounter (Signed)
Called to review imaging findings with Ashley Gross.  All imaging findings are stable.  We will repeat her MRI and MR angiogram in approximately 1 year.  If her findings are stable at that time, we will discuss ongoing serial imaging versus watchful waiting.

## 2023-05-10 NOTE — Assessment & Plan Note (Signed)
Has seen surgery and dermatology.  Diagnosed with psoriasis.  The pain is much improved.  Plans to f/u with GI

## 2023-05-10 NOTE — Assessment & Plan Note (Signed)
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  [facility] - 01/2023 - plan MRI/MRA 

## 2023-05-10 NOTE — Assessment & Plan Note (Signed)
Stable.  Has been followed by rheumatology.   

## 2023-05-10 NOTE — Assessment & Plan Note (Signed)
S/p colonoscopy 07/24/21 as outlined.  Continues f/u with GI.  On entyvio. Keep f/u with GI.  

## 2023-05-10 NOTE — Assessment & Plan Note (Signed)
On insulin.  Follow sugars. Follow met b and A1c. Low carb diet and exercise. Have discussed titrating down insulin if low sugars.  

## 2023-05-10 NOTE — Assessment & Plan Note (Signed)
Continue omeprazole. No increased acid reflux.   

## 2023-05-10 NOTE — Assessment & Plan Note (Signed)
Breathing stable.  No increased cough and congestion.  Continue symbicort.  Has duonebs if needed.

## 2023-05-10 NOTE — Assessment & Plan Note (Signed)
Continue crestor. Follow lipid panel and liver function tests.   

## 2023-05-10 NOTE — Assessment & Plan Note (Signed)
Followed by [facility].  

## 2023-05-10 NOTE — Assessment & Plan Note (Addendum)
-   Patient has symptoms of restless sleep and daytime sleepiness. HST 6/18//24 showed severe OSA, AHI 33.8/hour with SpO2 low 76%. Recommend patient be started on auto CPAP due severity of sleep apnea and nocturnal hypoxia. We will start her on auto CPAP 5-15CM H20. Patient will aim to wear CPAP nightly for 4-6 hours or longer. Advised she look into getting wedge pillow for back sleepers. FU 6 weeks for compliance check.   Orders: CPAP AUTO 5-15cm h20 with mask of choice (preferably nasal mask)  Follow-up: 6-8 week follow-up for CPAP compliance

## 2023-05-10 NOTE — Assessment & Plan Note (Signed)
-   Patient has difficulty falling and stay asleep. Little to no improvement with Neurontin, tylenol pm or melatonin. Start Rozerum 8mg  at bedtime (take 30-60 min before going to sleep. Do not drive while taking. If having significant daytime grogginess the next day notify office). FU in 6 weeks

## 2023-05-10 NOTE — Assessment & Plan Note (Signed)
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  MRA head and neck were negative.  ECHO - no evidence of embolic source. With history of significant GI bleed in the past due to AVM, polyp bleed.   

## 2023-05-10 NOTE — Assessment & Plan Note (Signed)
On amlodipine and losartan.  Follow pressures.  Follow metabolic panel.  

## 2023-05-11 ENCOUNTER — Other Ambulatory Visit (HOSPITAL_COMMUNITY): Payer: Self-pay

## 2023-05-11 MED ORDER — DOXEPIN HCL 3 MG PO TABS: 3.0000 mg | ORAL_TABLET | Freq: Every evening | ORAL | 0 refills | Status: DC | PRN

## 2023-05-11 NOTE — Telephone Encounter (Signed)
I'll send in a medication called Doxepin, start off with 3mg . We can increased dose if needed. Do not take with tylenol PM, other sedative or alcohol. Rx sent to [facility] on file

## 2023-05-11 NOTE — Telephone Encounter (Signed)
Patient may have a deductible/donut hole to get through before the insurance will pay more on the medication. Patient may have to contact plan for formulary alternatives.

## 2023-05-11 NOTE — Telephone Encounter (Signed)
Lm for patient.  

## 2023-05-11 NOTE — Telephone Encounter (Signed)
Spoke to patient and relayed below message/recommendations.  She is concerned about trying Trazodone, as her daughter took it previously and it caused her to have seizures.   Beth, please advise. Thanks

## 2023-05-11 NOTE — Telephone Encounter (Signed)
Pt returning missed call. 

## 2023-05-11 NOTE — Telephone Encounter (Signed)
Discontinue Rozerem. Please send in trazodone 50mg  a bedtime at bedtime prn insomnia  Instruct her to start of with 1/2 tablet (25mg ) at bedtime, can increased to 50mg  if needed

## 2023-05-11 NOTE — Telephone Encounter (Signed)
I notified the patient. She said her pharmacist suggested sending her in a medication that has been around a little longer. She said they told her this is a fairly new medication and that is why it is so expensive. She wants to know if you can try sending in another medication?

## 2023-05-12 NOTE — Telephone Encounter (Signed)
I relayed Ashley Gross's instructions to PT. She expressed understanding. NFN.

## 2023-05-12 NOTE — Telephone Encounter (Signed)
Lm x1 for patient.  

## 2023-05-13 ENCOUNTER — Telehealth: Payer: Self-pay | Admitting: Internal Medicine

## 2023-05-13 NOTE — Telephone Encounter (Signed)
Copied from CRM [id number]. Topic: Medicare AWV >> May 13, 2023  1:17 PM Payton Doughty wrote: Reason for CRM: LM 05/13/2023 to schedule AWV   Verlee Rossetti; Care Guide Ambulatory Clinical Support [place] [facility] Direct Dial: [phone]

## 2023-05-19 ENCOUNTER — Encounter: Payer: Self-pay | Admitting: Vascular Surgery

## 2023-05-19 ENCOUNTER — Ambulatory Visit: Payer: PPO | Admitting: Vascular Surgery

## 2023-05-19 VITALS — BP 137/58 | HR 66 | Temp 97.2°F | Resp 20 | Ht 64.0 in | Wt 146.0 lb

## 2023-05-19 DIAGNOSIS — M7989 Other specified soft tissue disorders: Secondary | ICD-10-CM

## 2023-05-19 DIAGNOSIS — I872 Venous insufficiency (chronic) (peripheral): Secondary | ICD-10-CM | POA: Diagnosis not present

## 2023-05-19 NOTE — Progress Notes (Signed)
Patient ID: Ashley Gross, female   DOB: 05-23-1951, 72 y.o.   MRN: [id number]  Reason for Consult: Follow-up   Referred by Dale Paragonah, MD  Subjective:     HPI:  Ashley Gross is a 72 y.o. female with history of lower extremity swelling with changes that include blotchy discoloration associated with the swelling.  She states that in the morning she does not have any swelling or skin changes but by the afternoon these are both present.  Swelling is equal on both sides.  She denies any venous interventions in the past and does not have any history of DVT.  She does not have any tissue loss or ulceration has never had venous ulceration in the past.  She is active.  She has not been wearing compression stockings.  Past Medical History:  Diagnosis Date   Abnormal liver function test 10/06/2012   Anemia 01/25/2022   Anemia, unspecified    Asthma    B12 deficiency 02/11/2019   Bleeding internal hemorrhoids 09/13/2017   Choledocholithiasis 06/29/2012   Formatting of this note might be different from the original. Dilated CBD on abdominal imaging 04/2012   Chronic diarrhea 02/16/2013   Colitis    Complication of anesthesia    asthma attack after shoulder surgery   Diabetes mellitus (HCC)    type II   Esophagitis    GERD (gastroesophageal reflux disease)    Heart murmur    for years- nothing to be concerned about.   History of kidney stones    Hx of arteriovenous malformation (AVM)    Hypertension    IBS (irritable bowel syndrome)    Loss of weight    Pernicious anemia    Personal history of colonic polyps 02/10/2013   02/08/13 colonoscopy - question of rectal varices, two 3-73mm polyps in the sigmoid colon, mass at the hepatic flexure, one 5mm polyp at the hepatic flexure, nodule at the ileocecal valve, congested mucusa in the entire colon, flattened villi mucosa in the terminal ileum.     Pneumonia    Psoriatic arthritis (HCC)    s/p penicillamine, plaquenil, MTX,  sulfasalazine.  s/p Embrel, Humira,.  Iritis.     Pure hypercholesterolemia    Rectal bleeding 07/15/2017   Stroke (HCC) 05/29/2022   per patient   Family History  Problem Relation Age of Onset   Diabetes Other    Hypertension Other    Breast cancer Neg Hx    Colon cancer Neg Hx    Past Surgical History:  Procedure Laterality Date   ABDOMINAL HYSTERECTOMY  1981   ovaries left in place   ANKLE SURGERY     right   BUNIONECTOMY     CHOLECYSTECTOMY  1989   COLONOSCOPY WITH PROPOFOL N/A 12/27/2017   Procedure: COLONOSCOPY WITH PROPOFOL;  Surgeon: Toney Reil, MD;  Location: [facility];  Service: Gastroenterology;  Laterality: N/A;   COLONOSCOPY WITH PROPOFOL N/A 01/04/2019   Procedure: COLONOSCOPY WITH PROPOFOL;  Surgeon: Toney Reil, MD;  Location: [facility];  Service: Endoscopy;  Laterality: N/A;  Diabetic - oral and injectable   COLONOSCOPY WITH PROPOFOL N/A 07/24/2021   Procedure: COLONOSCOPY WITH PROPOFOL;  Surgeon: Toney Reil, MD;  Location: [facility];  Service: Endoscopy;  Laterality: N/A;  Diabetic   ESOPHAGOGASTRODUODENOSCOPY (EGD) WITH PROPOFOL N/A 12/27/2017   Procedure: ESOPHAGOGASTRODUODENOSCOPY (EGD) WITH PROPOFOL;  Surgeon: Toney Reil, MD;  Location: [facility];  Service: Gastroenterology;  Laterality: N/A;   HAND SURGERY  right   HEEL SPUR SURGERY     IR 3D INDEPENDENT WKST  10/01/2021   IR ANGIO INTRA EXTRACRAN SEL COM CAROTID INNOMINATE UNI L MOD SED  10/01/2021   IR ANGIO INTRA EXTRACRAN SEL INTERNAL CAROTID UNI R MOD SED  10/01/2021   IR ANGIO INTRA EXTRACRAN SEL INTERNAL CAROTID UNI R MOD SED  05/29/2022   IR ANGIO VERTEBRAL SEL VERTEBRAL BILAT MOD SED  10/01/2021   IR ANGIO VERTEBRAL SEL VERTEBRAL UNI R MOD SED  05/29/2022   IR CT HEAD LTD  10/01/2021   IR TRANSCATH/EMBOLIZ  10/01/2021   IR US GUIDE VASC ACCESS RIGHT  10/01/2021   IR US GUIDE VASC ACCESS RIGHT  05/29/2022   NOSE SURGERY     x2    POLYPECTOMY  01/04/2019   Procedure: POLYPECTOMY;  Surgeon: Toney Reil, MD;  Location: [facility];  Service: Endoscopy;;   POLYPECTOMY N/A 07/24/2021   Procedure: POLYPECTOMY;  Surgeon: Toney Reil, MD;  Location: [facility];  Service: Endoscopy;  Laterality: N/A;   RADIOLOGY WITH ANESTHESIA N/A 10/01/2021   Procedure: Alfredia Client WITH ANESTHESIA;  Surgeon: Baldemar Lenis, MD;  Location: [facility] OR;  Service: Radiology;  Laterality: N/A;   SHOULDER ARTHROSCOPY WITH OPEN ROTATOR CUFF REPAIR Left 09/20/2018   Procedure: SHOULDER ARTHROSCOPY WITH OPEN ROTATOR CUFF REPAIR;  Surgeon: Christena Flake, MD;  Location: [facility];  Service: Orthopedics;  Laterality: Left;   SHOULDER CLOSED REDUCTION Left 12/21/2018   Procedure: MANIPULATION UNDER ANESTHESIA WITH STEROID INJECTION;  Surgeon: Christena Flake, MD;  Location: [facility];  Service: Orthopedics;  Laterality: Left;  Diabetic - insulin and oral meds   UMBILICAL HERNIA REPAIR     XI ROBOTIC ASSISTED INGUINAL HERNIA REPAIR WITH MESH Left 12/16/2021   Procedure: XI ROBOTIC ASSISTED INGUINAL HERNIA REPAIR WITH MESH;  Surgeon: [name], MD;  Location: [facility];  Service: General;  Laterality: Left;    Short Social History:  Social History   Tobacco Use   Smoking status: Never    Passive exposure: Never   Smokeless tobacco: Never  Substance Use Topics   Alcohol use: Never    Allergies  Allergen Reactions   Aspirin Other (See Comments)    Increased bleeding   Beta Adrenergic Blockers     3rd degree blockage    Lisinopril Cough   Mtx Support [Cobalamine Combinations] Other (See Comments)    Respiratory symptoms   Nsaids    Vasotec [Enalapril] Cough   Verapamil Other (See Comments)    Heart block   Voltaren [Diclofenac Sodium]     Pt unsure of reaction    Erythromycin Other (See Comments)    Gi intolerance   Indocin [Indomethacin]     Upset stomach   Jardiance [Empagliflozin] Other  (See Comments)    Recurrent yeast infections, even with washout and rechallenge    Current Outpatient Medications  Medication Sig Dispense Refill   acetaminophen (TYLENOL) 500 MG tablet Take 2 tablets (1,000 mg total) by mouth every 6 (six) hours as needed for mild pain.     albuterol (VENTOLIN HFA) 108 (90 Base) MCG/ACT inhaler Inhale 2 puffs into the lungs every 6 (six) hours as needed for wheezing or shortness of breath.     amLODipine (NORVASC) 5 MG tablet TAKE 1 TABLET(5 MG) BY MOUTH TWICE DAILY 180 tablet 1   budesonide-formoterol (SYMBICORT) 160-4.5 MCG/ACT inhaler Inhale 2 puffs into the lungs 2 (two) times daily as needed (shortness of breath).  30.6 g 3   Cholecalciferol (VITAMIN D) 50 MCG (2000 UT) tablet Take 2,000 Units by mouth daily.     CREON 36000-114000 units CPEP capsule Take 1-2 capsules (36,000-72,000 Units total) by mouth See admin instructions. Take 720000 units with each meal and 19147 units with snacks 240 capsule 3   Doxepin HCl 3 MG TABS Take 1 tablet (3 mg total) by mouth at bedtime as needed (Insomnia). 30 tablet 0   fluticasone-salmeterol (ADVAIR DISKUS) 100-50 MCG/ACT AEPB Inhale 1 puff into the lungs 2 (two) times daily. 1 each 3   gabapentin (NEURONTIN) 600 MG tablet Take 600 mg by mouth daily.     insulin degludec (TRESIBA FLEXTOUCH) 100 UNIT/ML FlexTouch Pen Inject 16 Units into the skin daily. 15 mL 1   ipratropium-albuterol (DUONEB) 0.5-2.5 (3) MG/3ML SOLN Take 3 mLs by nebulization every 4 (four) hours as needed. 360 mL 0   loperamide (IMODIUM) 2 MG capsule Take 4 mg by mouth daily.     losartan (COZAAR) 100 MG tablet TAKE 1 TABLET(100 MG) BY MOUTH AT BEDTIME 90 tablet 1   mometasone (ELOCON) 0.1 % ointment Apply 1 Application topically 2 (two) times daily.     omeprazole (PRILOSEC) 20 MG capsule TAKE 1 CAPSULE(20 MG) BY MOUTH TWICE DAILY 180 capsule 3   ONE TOUCH ULTRA TEST test strip PATIENT NEEDS NEW METER STRIPS AND LANCETS FOR ONE TOUCH. HER INSURANCE NO  LONGER COVERS ACCUCHEK. 100 each PRN   Probiotic Product (PROBIOTIC DAILY PO) Take 1 capsule by mouth daily.     ramelteon (ROZEREM) 8 MG tablet Take 1 tablet (8 mg total) by mouth at bedtime. 30 tablet 1   rosuvastatin (CRESTOR) 10 MG tablet TAKE 1 TABLET(10 MG) BY MOUTH DAILY 90 tablet 3   No current facility-administered medications for this visit.    Review of Systems  Constitutional:  Constitutional negative. HENT: HENT negative.  Eyes: Eyes negative.  Cardiovascular: Positive for leg swelling.  GI: Gastrointestinal negative.  Musculoskeletal: Musculoskeletal negative.  Skin: Positive for rash.  Neurological: Neurological negative. Hematologic: Hematologic/lymphatic negative.  Psychiatric: Psychiatric negative.        Objective:  Objective   Vitals:   05/19/23 1447  BP: (!) 137/58  Pulse: 66  Resp: 20  Temp: (!) 97.2 F (36.2 C)  SpO2: 97%  Weight: 146 lb (66.2 kg)  Height: 5\' 4"  (1.626 m)   Body mass index is 25.06 kg/m.  Physical Exam HENT:     Head: Normocephalic.     Nose: Nose normal.     Mouth/Throat:     Mouth: Mucous membranes are moist.  Eyes:     Pupils: Pupils are equal, round, and reactive to light.  Cardiovascular:     Rate and Rhythm: Normal rate.     Pulses: Normal pulses.  Pulmonary:     Effort: Pulmonary effort is normal.  Abdominal:     General: Abdomen is flat.  Musculoskeletal:     Cervical back: Normal range of motion.     Right lower leg: Edema present.     Left lower leg: Edema present.  Skin:    General: Skin is warm.     Capillary Refill: Capillary refill takes less than 2 seconds.     Findings: Rash present.  Neurological:     General: No focal deficit present.     Mental Status: She is alert.  Psychiatric:        Mood and Affect: Mood normal.  Thought Content: Thought content normal.        Judgment: Judgment normal.     Data: Venous Reflux Times   +--------------+---------+------+-----------+------------+--------+  RIGHT        Reflux NoRefluxReflux TimeDiameter cmsComments                          Yes                                   +--------------+---------+------+-----------+------------+--------+  CFV          no                                              +--------------+---------+------+-----------+------------+--------+  FV prox       no                                              +--------------+---------+------+-----------+------------+--------+  FV mid        no                                              +--------------+---------+------+-----------+------------+--------+  FV dist       no                                              +--------------+---------+------+-----------+------------+--------+  Popliteal    no                                              +--------------+---------+------+-----------+------------+--------+  GSV at SFJ              yes    >500 ms      0.74              +--------------+---------+------+-----------+------------+--------+  GSV prox thighno                            0.73              +--------------+---------+------+-----------+------------+--------+  GSV mid thigh no                            0.35              +--------------+---------+------+-----------+------------+--------+  GSV dist thighno                            0.37              +--------------+---------+------+-----------+------------+--------+  GSV at knee             yes    >500 ms      0.34              +--------------+---------+------+-----------+------------+--------+  GSV prox calf           yes    >500 ms      0.26              +--------------+---------+------+-----------+------------+--------+  SSV Pop Fossa no                            0.08               +--------------+---------+------+-----------+------------+--------+  SSV prox calf no                            0.21              +--------------+---------+------+-----------+------------+--------+         Summary:  Right:  - No evidence of deep vein thrombosis seen in the right lower extremity,  from the common femoral through the popliteal veins.  - No evidence of superficial venous thrombosis in the right lower  extremity.    - Venous reflux is noted in the right sapheno-femoral junction.  - Venous reflux is noted in the right greater saphenous vein in the calf.      Assessment/Plan:    72 year old female with bilateral lower extremity edema and skin changes that appear more like a dermatitis but only occur when she has the swelling which is later in the day.  Given that she has not been wearing compression stockings we will fit her for these today and plan to see her back in 3 months and at that time we will check left lower extremity venous reflux study given that the right was really equivocal at initial evaluation.  She agrees to wear compression stockings as directed and will follow-up in 3 months.     Maeola Harman MD [facility]

## 2023-05-20 MED ORDER — VEDOLIZUMAB 300 MG IV SOLR: 300.0000 mg | Freq: Once | INTRAVENOUS | Status: DC

## 2023-05-21 ENCOUNTER — Ambulatory Visit
Admission: RE | Admit: 2023-05-21 | Discharge: 2023-05-21 | Disposition: A | Discharge status: Home / Self Care | Payer: PPO | Source: Ambulatory Visit | Attending: Gastroenterology | Admitting: Gastroenterology

## 2023-05-21 DIAGNOSIS — D126 Benign neoplasm of colon, unspecified: Secondary | ICD-10-CM | POA: Diagnosis not present

## 2023-05-21 MED ORDER — VEDOLIZUMAB 300 MG IV SOLR
300.0000 mg | Freq: Once | INTRAVENOUS | Status: AC
  Administered 2023-05-21: 300 mg via INTRAVENOUS
  Filled 2023-05-21: qty 5

## 2023-05-21 NOTE — Telephone Encounter (Signed)
RECEIVED PT INCOME AND Submitted application for TRESIBA FLEXTOUCH to [employer] for patient assistance.   Phone: [phone]  PLEASED BE ADVISED

## 2023-05-26 ENCOUNTER — Other Ambulatory Visit: Payer: Self-pay

## 2023-05-26 DIAGNOSIS — I872 Venous insufficiency (chronic) (peripheral): Secondary | ICD-10-CM

## 2023-05-27 DIAGNOSIS — G4733 Obstructive sleep apnea (adult) (pediatric): Secondary | ICD-10-CM | POA: Diagnosis not present

## 2023-05-28 NOTE — Telephone Encounter (Signed)
Office received fax from [employer] regarding [employer] app that provider license number submitted had expired. Can we follow up on this with [employer]? Thanks!

## 2023-05-31 NOTE — Telephone Encounter (Signed)
Received notification from [employer] regarding approval for [facility]. Patient assistance APPROVED from 05/31/2023 to 05/30/2024.  Phone: [phone]   PLEASE BE ADVISED PT CAN CALL NUMBER ABOVE TO CHECK STATUS ON SHIPPING.   Georga [facility] [employer] Patient Advocate [phone]

## 2023-06-02 ENCOUNTER — Encounter: Payer: Self-pay | Admitting: Gastroenterology

## 2023-06-02 ENCOUNTER — Ambulatory Visit: Payer: PPO | Admitting: Gastroenterology

## 2023-06-02 VITALS — BP 154/80 | HR 74 | Temp 98.1°F | Ht 64.0 in | Wt 148.4 lb

## 2023-06-02 DIAGNOSIS — D500 Iron deficiency anemia secondary to blood loss (chronic): Secondary | ICD-10-CM

## 2023-06-02 DIAGNOSIS — K51411 Inflammatory polyps of colon with rectal bleeding: Secondary | ICD-10-CM | POA: Diagnosis not present

## 2023-06-02 NOTE — Progress Notes (Signed)
Arlyss Repress, MD [address]  [address]  [place], [place] [address]  Main: [phone]  Fax: [phone]    Gastroenterology Consultation  Referring Provider:     Dale [place], MD Primary Care Physician:  Dale [place], MD Primary Gastroenterologist:  Dr. Arlyss Repress Reason for Consultation:   Unexplained weight loss, exocrine pancreatic insufficiency, inflammatory polyposis of the colon        HPI:   Ashley Gross is a 72 y.o. white female referred by Dr. Dale [place], MD  for consultation & management of chronic diarrhea. Patient does have history of inflammatory polyposis of the colon with no evidence of underlying IBD since 2014, chronic iron deficiency anemia as well as exocrine pancreatic insufficiency.  With regards to rectal bleeding and inflammatory polyposis, she underwent colonoscopy in September 2022 and large polyps were removed which helped in improvement of anemia.  However, rectal bleeding has recurred lately.  During that time, I discussed with patient regarding the role of Entyvio which has been reported in case series for management of inflammatory polyposis.  However, patient did not receive Entyvio infusion.  Follow-up visit 07/15/2022 Patient is here for follow-up of inflammatory polyposis of the colon and weight loss, chronic diarrhea.  She is started [facility], received 3 induction doses and has been tolerating the infusions well.  She has iron deficiency anemia from chronic blood loss secondary to inflammatory polyposis, received parenteral iron therapy, iron deficiency has improved.  She does have severe B12 deficiency, received B12 injection last month.  She stopped taking Creon because she could not get the medication refilled.  She felt Creon was helping to reduce diarrhea frequency.  She is taking Imodium twice daily, continues to have 4-5 mushy bowel movements daily mixed with blood.  She gained about 12 pounds since last visit.  She is now  moved to live with her children along with her husband, has been eating well.  Follow-up visit 10/14/2022 Ashley Gross is here for follow-up of inflammatory polyposis of the colon, weight loss and chronic diarrhea secondary to EPI.  Patient is on Entyvio every 8 weeks, maintenance monotherapy has been tolerating it well.  She has gained more than 10 pounds within last 1 year.  Now her hemoglobin A1c is at 7.  She reports intermittent episodes of diarrhea for which she takes Imodium.  She denies rectal bleeding on a regular basis, occasional mucousy drainage per rectum only.  She does not have any GI concerns today.  She has not required any parenteral iron therapy in a long time.  Her anemia has been stable.  No evidence of iron deficiency  Follow-up visit 06/02/2023 Ashley Gross continues to regain her weight.  Her BMI is currently 25.  Her main concern today is worsening of diarrhea and rectal bleeding which she noticed that her symptoms worsen 2 weeks before her Entyvio infusion.  This has been happening within last 3 months.  She had an episode of bowel incontinence at work as well.  She reports having up to 4 bowel movements a day on good days.  She denies any abdominal bloating.  She is compliant with Creon 72 K with each meal and 36 K with snack  GI Procedures:  Colonoscopy 07/24/2021 - The examined portion of the ileum was normal. - Three 5 to 7 mm polyps in the transverse colon, in the ascending colon and in the cecum, removed with a cold snare. Resected and retrieved. - Normal mucosa in  the entire examined colon. Biopsied. - Polyposis at the recto-sigmoid colon, in the sigmoid colon and in the ascending colon. DIAGNOSIS:  A.  COLON POLYP, CECUM; COLD SNARE:  - INFLAMMATORY POLYP.  - NEGATIVE FOR DYSPLASIA AND MALIGNANCY.   B.  COLON POLYP, ASCENDING; COLD SNARE:  - INFLAMMATORY POLYP.  - NEGATIVE FOR DYSPLASIA AND MALIGNANCY.   C.  COLON; RANDOM COLD BIOPSY:  - COLONIC MUCOSA  WITH INTACT CRYPT ARCHITECTURE.  - NEGATIVE FOR MICROSCOPIC COLITIS, DYSPLASIA, AND MALIGNANCY.   D.  COLON POLYP, TRANSVERSE; COLD SNARE:  - FECAL FRAGMENTS; NO TISSUE IS IDENTIFIED.   Colonoscopy 01/04/2019 - Hemorrhoids found on perianal exam. - The examined portion of the ileum was normal. - Multiple 10 to 30 mm, recently bleeding polyps in the sigmoid colon and in the descending colon, removed with a hot snare. Polyp resection was incomplete, and the resected tissue was partially retrieved.     Colonoscopy 04/06/2007 Indications:  last colonoscopy 2001, anemia. Impression: -Congested mucosa in the entire colon. -Nodules in the proximal ascending colon, biopsied. -Nodule in the distal ascending colon, biopsied. -Congested erythematous and friable (with contact bleeding) mucosa in the  ascending colon and in the cecum. . "CECUM" (ENDOSCOPIC BIOPSY)     COLONIC MUCOSA WITH NO PATHOLOGIC DIAGNOSIS.     NO ACTIVE OR CHRONIC COLITIS IS SEEN.     NO EVIDENCE OF LYMPHOCYTIC OR COLLAGENOUS COLITIS IS SEEN. B. "RIGHT COLON NODULES" (ENDOSCOPIC BIOPSY):     INFLAMMATORY POLYPS. C. "RECTOSIGMOID COLON" (ENDOSCOPIC BIOPSY):     COLONIC MUCOSA WITH NO PATHOLOGIC DIAGNOSIS.     NO ACTIVE OR CHRONIC COLITIS IS SEEN.     NO EVIDENCE OF LYMPHOCYTIC OR COLLAGENOUS COLITIS IS SEEN. D. "DISTAL ASCENDING COLON" (ENDOSCOPIC BIOPSY):     INFLAMMATORY POLYPS. ------------------------------------------------------------------------------------------------- Colonoscopy 2014  A. "ILEUM" (ENDOSCOPIC BIOPSY):     SMALL INTESTINAL MUCOSA WITH NO PATHOLOGIC DIAGNOSIS.     NO ACTIVE OR CHRONIC ENTERITIS IS SEEN.     NO GRANULOMATOUS INFLAMMATION IS SEEN. B. "COLON, ILEOCECAL VALVE NODULE" (BIOPSY):     COLONIC MUCOSA, WITH REACTIVE CHANGES.     NO ADENOMATOUS MUCOSA IS IDENTIFIED. C. "RIGHT COLON" (ENDOSCOPIC BIOPSY):     COLONIC MUCOSA WITH NO PATHOLOGIC DIAGNOSIS.     NO ACTIVE OR CHRONIC COLITIS IS  SEEN.     NO EVIDENCE OF LYMPHOCYTIC OR COLLAGENOUS COLITIS IS SEEN. D. "HEPATIC FLEXURE MASS" (ENDOSCOPIC BIOPSY):     INFLAMMATORY POLYP.     NO ADENOMATOUS MUCOSA IS SEEN. E. "TRANSVERSE COLON" (ENDOSCOPIC BIOPSY):     COLONIC MUCOSA WITH NO PATHOLOGIC DIAGNOSIS.     NO ACTIVE OR CHRONIC COLITIS IS SEEN.     NO EVIDENCE OF LYMPHOCYTIC OR COLLAGENOUS COLITIS IS SEEN. F. "LEFT COLON" (ENDOSCOPIC BIOPSY):     COLONIC MUCOSA WITH NO PATHOLOGIC DIAGNOSIS.     NO ACTIVE OR CHRONIC COLITIS IS SEEN.     NO EVIDENCE OF LYMPHOCYTIC OR COLLAGENOUS COLITIS IS SEEN.     A MINUTE HYPERPLASTIC POLYP IS ALSO PRESENT. G. "LEFT COLON POLYP(S)" (ENDOSCOPIC POLYPECTOMY):     TUBULAR ADENOMA.     NO HIGH GRADE DYSPLASIA OR CARCINOMA IS SEEN.  Colonoscopy 12/27/2017 - The examined portion of the ileum was normal. Biopsied. - One 12 mm polyp in the ascending colon, removed with a hot snare. Resected and retrieved. - One 40 mm polyp in the transverse colon, removed with a hot snare. Incomplete resection. Resected tissue retrieved. - A few 8 to 15 mm,  non-bleeding polyps in the sigmoid colon. Resection not attempted. - The distal rectum and anal verge are normal on retroflexion view.   Upper endoscopy 12/27/2017 - Normal duodenal bulb and second portion of the duodenum. Biopsied. - Erythematous mucosa in the gastric body and antrum. - Normal cardia, gastric fundus and incisura. Biopsied. - Normal gastroesophageal junction and esophagus.  DIAGNOSIS:  A. DUODENUM; COLD BIOPSY:  - DUODENAL MUCOSA WITH PRESERVED VILLOUS ARCHITECTURE AND BRUNNER'S  GLAND HYPERPLASIA.  - NEGATIVE FOR INTRA-EPITHELIAL LYMPHOCYTOSIS, DYSPLASIA AND MALIGNANCY.   B.  STOMACH; COLD BIOPSY:  - ANTRAL AND OXYNTIC MUCOSA WITH MILD CHRONIC GASTRITIS.  - NEGATIVE FOR H. PYLORI, DYSPLASIA AND MALIGNANCY.   C.  TERMINAL ILEUM; COLD BIOPSY:  - SMALL BOWEL MUCOSA WITH INTACT VILLOUS ARCHITECTURE.  - NEGATIVE FOR INTRAEPITHELIAL  LYMPHOCYTOSIS, DYSPLASIA AND MALIGNANCY.   D.  RANDOM RIGHT COLON; COLD BIOPSY:  - COLONIC MUCOSA NEGATIVE FOR MICROSCOPIC COLITIS, DYSPLASIA AND  MALIGNANCY.    E.  COLON POLYP, ASCENDING; HOT SNARE:  - INFLAMMATORY-TYPE POLYP.   F.  COLON POLYP, TRANSVERSE; HOT SNARE:  - INFLAMMATORY-TYPE POLYP.   G.  RANDOM TRANSVERSE COLON; COLD BIOPSY:  - COLONIC MUCOSA NEGATIVE FOR MICROSCOPIC COLITIS, DYSPLASIA AND  MALIGNANCY.   Past Medical History:  Diagnosis Date   Abnormal liver function test 10/06/2012   Anemia 01/25/2022   Anemia, unspecified    Asthma    B12 deficiency 02/11/2019   Bleeding internal hemorrhoids 09/13/2017   Choledocholithiasis 06/29/2012   Formatting of this note might be different from the original. Dilated CBD on abdominal imaging 04/2012   Chronic diarrhea 02/16/2013   Colitis    Complication of anesthesia    asthma attack after shoulder surgery   Diabetes mellitus (HCC)    type II   Esophagitis    GERD (gastroesophageal reflux disease)    Heart murmur    for years- nothing to be concerned about.   History of kidney stones    Hx of arteriovenous malformation (AVM)    Hypertension    IBS (irritable bowel syndrome)    Loss of weight    Pernicious anemia    Personal history of colonic polyps 02/10/2013   02/08/13 colonoscopy - question of rectal varices, two 3-25mm polyps in the sigmoid colon, mass at the hepatic flexure, one 5mm polyp at the hepatic flexure, nodule at the ileocecal valve, congested mucusa in the entire colon, flattened villi mucosa in the terminal ileum.     Pneumonia    Psoriatic arthritis (HCC)    s/p penicillamine, plaquenil, MTX, sulfasalazine.  s/p Embrel, Humira,.  Iritis.     Pure hypercholesterolemia    Rectal bleeding 07/15/2017   Stroke (HCC) 05/29/2022   per patient    Past Surgical History:  Procedure Laterality Date   ABDOMINAL HYSTERECTOMY  1981   ovaries left in place   ANKLE SURGERY     right   BUNIONECTOMY      CHOLECYSTECTOMY  1989   COLONOSCOPY WITH PROPOFOL N/A 12/27/2017   Procedure: COLONOSCOPY WITH PROPOFOL;  Surgeon: Toney Reil, MD;  Location: [facility] ENDOSCOPY;  Service: Gastroenterology;  Laterality: N/A;   COLONOSCOPY WITH PROPOFOL N/A 01/04/2019   Procedure: COLONOSCOPY WITH PROPOFOL;  Surgeon: Toney Reil, MD;  Location: [facility] SURGERY CNTR;  Service: Endoscopy;  Laterality: N/A;  Diabetic - oral and injectable   COLONOSCOPY WITH PROPOFOL N/A 07/24/2021   Procedure: COLONOSCOPY WITH PROPOFOL;  Surgeon: Toney Reil, MD;  Location: [facility] SURGERY CNTR;  Service: Endoscopy;  Laterality: N/A;  Diabetic   ESOPHAGOGASTRODUODENOSCOPY (EGD) WITH PROPOFOL N/A 12/27/2017   Procedure: ESOPHAGOGASTRODUODENOSCOPY (EGD) WITH PROPOFOL;  Surgeon: Toney Reil, MD;  Location: [facility] ENDOSCOPY;  Service: Gastroenterology;  Laterality: N/A;   HAND SURGERY     right   HEEL SPUR SURGERY     IR 3D INDEPENDENT WKST  10/01/2021   IR ANGIO INTRA EXTRACRAN SEL COM CAROTID INNOMINATE UNI L MOD SED  10/01/2021   IR ANGIO INTRA EXTRACRAN SEL INTERNAL CAROTID UNI R MOD SED  10/01/2021   IR ANGIO INTRA EXTRACRAN SEL INTERNAL CAROTID UNI R MOD SED  05/29/2022   IR ANGIO VERTEBRAL SEL VERTEBRAL BILAT MOD SED  10/01/2021   IR ANGIO VERTEBRAL SEL VERTEBRAL UNI R MOD SED  05/29/2022   IR CT HEAD LTD  10/01/2021   IR TRANSCATH/EMBOLIZ  10/01/2021   IR US GUIDE VASC ACCESS RIGHT  10/01/2021   IR US GUIDE VASC ACCESS RIGHT  05/29/2022   NOSE SURGERY     x2   POLYPECTOMY  01/04/2019   Procedure: POLYPECTOMY;  Surgeon: Toney Reil, MD;  Location: [facility] SURGERY CNTR;  Service: Endoscopy;;   POLYPECTOMY N/A 07/24/2021   Procedure: POLYPECTOMY;  Surgeon: Toney Reil, MD;  Location: [facility] SURGERY CNTR;  Service: Endoscopy;  Laterality: N/A;   RADIOLOGY WITH ANESTHESIA N/A 10/01/2021   Procedure: Alfredia Client WITH ANESTHESIA;  Surgeon: Baldemar Lenis, MD;  Location: [facility] OR;   Service: Radiology;  Laterality: N/A;   SHOULDER ARTHROSCOPY WITH OPEN ROTATOR CUFF REPAIR Left 09/20/2018   Procedure: SHOULDER ARTHROSCOPY WITH OPEN ROTATOR CUFF REPAIR;  Surgeon: Christena Flake, MD;  Location: [facility] ORS;  Service: Orthopedics;  Laterality: Left;   SHOULDER CLOSED REDUCTION Left 12/21/2018   Procedure: MANIPULATION UNDER ANESTHESIA WITH STEROID INJECTION;  Surgeon: Christena Flake, MD;  Location: [facility] SURGERY CNTR;  Service: Orthopedics;  Laterality: Left;  Diabetic - insulin and oral meds   UMBILICAL HERNIA REPAIR     XI ROBOTIC ASSISTED INGUINAL HERNIA REPAIR WITH MESH Left 12/16/2021   Procedure: XI ROBOTIC ASSISTED INGUINAL HERNIA REPAIR WITH MESH;  Surgeon: [name], MD;  Location: [facility] ORS;  Service: General;  Laterality: Left;     Current Outpatient Medications:    acetaminophen (TYLENOL) 500 MG tablet, Take 2 tablets (1,000 mg total) by mouth every 6 (six) hours as needed for mild pain., Disp: , Rfl:    albuterol (VENTOLIN HFA) 108 (90 Base) MCG/ACT inhaler, Inhale 2 puffs into the lungs every 6 (six) hours as needed for wheezing or shortness of breath., Disp: , Rfl:    amLODipine (NORVASC) 5 MG tablet, TAKE 1 TABLET(5 MG) BY MOUTH TWICE DAILY, Disp: 180 tablet, Rfl: 1   budesonide-formoterol (SYMBICORT) 160-4.5 MCG/ACT inhaler, Inhale 2 puffs into the lungs 2 (two) times daily as needed (shortness of breath)., Disp: 30.6 g, Rfl: 3   Cholecalciferol (VITAMIN D) 50 MCG (2000 UT) tablet, Take 2,000 Units by mouth daily., Disp: , Rfl:    CREON 36000-114000 units CPEP capsule, Take 1-2 capsules (36,000-72,000 Units total) by mouth See admin instructions. Take 720000 units with each meal and [address] units with snacks, Disp: 240 capsule, Rfl: 3   fluticasone-salmeterol (ADVAIR DISKUS) 100-50 MCG/ACT AEPB, Inhale 1 puff into the lungs 2 (two) times daily., Disp: 1 each, Rfl: 3   gabapentin (NEURONTIN) 600 MG tablet, Take 600 mg by mouth daily., Disp: , Rfl:    insulin degludec  (TRESIBA FLEXTOUCH) 100 UNIT/ML FlexTouch Pen, Inject 16 Units into the  skin daily., Disp: 15 mL, Rfl: 1   ipratropium-albuterol (DUONEB) 0.5-2.5 (3) MG/3ML SOLN, Take 3 mLs by nebulization every 4 (four) hours as needed., Disp: 360 mL, Rfl: 0   loperamide (IMODIUM) 2 MG capsule, Take 4 mg by mouth daily., Disp: , Rfl:    losartan (COZAAR) 100 MG tablet, TAKE 1 TABLET(100 MG) BY MOUTH AT BEDTIME, Disp: 90 tablet, Rfl: 1   mometasone (ELOCON) 0.1 % ointment, Apply 1 Application topically 2 (two) times daily., Disp: , Rfl:    omeprazole (PRILOSEC) 20 MG capsule, TAKE 1 CAPSULE(20 MG) BY MOUTH TWICE DAILY, Disp: 180 capsule, Rfl: 3   ONE TOUCH ULTRA TEST test strip, PATIENT NEEDS NEW METER STRIPS AND LANCETS FOR ONE TOUCH. HER INSURANCE NO LONGER COVERS ACCUCHEK., Disp: 100 each, Rfl: PRN   Probiotic Product (PROBIOTIC DAILY PO), Take 1 capsule by mouth daily., Disp: , Rfl:    rosuvastatin (CRESTOR) 10 MG tablet, TAKE 1 TABLET(10 MG) BY MOUTH DAILY, Disp: 90 tablet, Rfl: 3   Family History  Problem Relation Age of Onset   Diabetes Other    Hypertension Other    Breast cancer Neg Hx    Colon cancer Neg Hx      Social History   Tobacco Use   Smoking status: Never    Passive exposure: Never   Smokeless tobacco: Never  Vaping Use   Vaping status: Never Used  Substance Use Topics   Alcohol use: Never   Drug use: Never    Allergies as of 06/02/2023 - Review Complete 06/02/2023  Allergen Reaction Noted   Aspirin Other (See Comments) 10/06/2012   Beta adrenergic blockers  10/06/2012   Lisinopril Cough 12/21/2018   Mtx support [cobalamine combinations] Other (See Comments) 10/06/2012   Nsaids  02/18/2023   Vasotec [enalapril] Cough 10/06/2012   Verapamil Other (See Comments) 10/06/2012   Voltaren [diclofenac sodium]  10/06/2012   Erythromycin Other (See Comments) 10/06/2012   Indocin [indomethacin]  10/06/2012   Jardiance [empagliflozin] Other (See Comments) 02/26/2020    Review of  Systems:    All systems reviewed and negative except where noted in HPI.   Physical Exam:  BP (!) 154/80 (BP Location: Right Arm, Patient Position: Sitting, Cuff Size: Normal)   Pulse 74   Temp 98.1 F (36.7 C) (Oral)   Ht 5\' 4"  (1.626 m)   Wt 148 lb 6 oz (67.3 kg)   BMI 25.47 kg/m  No LMP recorded. Patient has had a hysterectomy.  General:   Alert, moderately built, moderately nourished, pleasant and cooperative in NAD Head:  Normocephalic and atraumatic. Eyes:  Sclera clear, no icterus.   Conjunctiva pink. Ears:  Normal auditory acuity. Nose:  No deformity, discharge, or lesions. Mouth:  No deformity or lesions,oropharynx pink & moist. Neck:  Supple; no masses or thyromegaly. Lungs:  Respirations even and unlabored.  Clear throughout to auscultation.   No wheezes, crackles, or rhonchi. No acute distress. Heart:  Regular rate and rhythm; no murmurs, clicks, rubs, or gallops. Abdomen:  Normal bowel sounds.  Obese, Soft, non-tender distended, no hepatosplenomegaly.  No guarding or rebound tenderness.   Rectal: Not done today Msk:  Symmetrical without gross deformities. Good, equal movement & strength bilaterally. Pulses:  Normal pulses noted. Extremities:  No clubbing or edema.  No cyanosis. Neurologic:  Alert and oriented x3;  grossly normal neurologically. Skin:  Intact without significant lesions or rashes. No jaundice. Psych:  Alert and cooperative. Normal mood and affect.  Imaging Studies: Reviewed  Assessment  and Plan:   Ashley Gross is a 72 y.o. white female with h/o psoriatic arthritis, chronic diarrhea, inflammatory polyps in colon presents for f/u of rectal bleeding and chronic diarrhea. She underwent ligation of RA, RP. LL internal hemorrhoids. Had extensive workup in the past which is negative for gastrinoma, IBD, microscopic colitis, celiac disease, chronic infections.  Rectal bleeding had resolved after resection of large inflammatory polyps.  However, she  developed severe weight loss, severe iron deficiency anemia with recurrence of rectal bleeding.  Therefore, she was initiated on Entyvio monotherapy in July 2023.  Now with worsening of diarrhea and bleeding.  Inflammatory polyposis of the colon are inflammatory Polyposis is a rare phenomenon which can lead to rectal bleeding and chronic diarrhea.  This may or may not be associated with inflammatory bowel disease.  Her colon biopsies were negative for inflammatory bowel disease.  Few case reports have been mentioned in the literature that in addition to removal of the large polyps, there might be a role for immunosuppressive therapy such as Biologics.  After discussing about the role of Entyvio, patient has been consented to start Entyvio monotherapy.  Her symptoms have resolved for several months, now with recurrence of diarrhea and rectal bleeding about 2 weeks before she is due for her next Entyvio infusion Check vedolizumab antibodies If antibodies are undetectable, recommended to increase Entyvio to every 4 to 6 weeks  Severe exocrine pancreatic insufficiency: Weight loss has resolved, Normal BMI Fecal elastase level 64 in 07/2021 Continue Creon 36K 2 capsules with each meal and 1 with snack, based on her weight CT abdomen and pelvis with contrast revealed diffuse edema of the large bowel secondary to inflammatory polyposis   Iron and B12 deficiency anemia: Has mild anemia With recurrence of rectal bleeding, recommend to check CBC, iron panel Continue to follow-up with hematology for parenteral iron therapy as needed Over-the-counter B12 supplements 500 to 1,000 mcg daily   Follow up in 3-4 months   Arlyss Repress, MD

## 2023-06-03 ENCOUNTER — Telehealth: Payer: Self-pay

## 2023-06-03 NOTE — Telephone Encounter (Signed)
-----   Message from [facility] sent at 06/03/2023  8:42 AM EDT ----- Please inform patient that her hemoglobin is dropping along with iron Dr. Smith Robert, Can you please arrange iron infusion for her  Thanks RV

## 2023-06-03 NOTE — Telephone Encounter (Signed)
Patient verbalized understanding of results. Informed her if she has not heard anything from Dr. Smith Robert office by Monday to let me know

## 2023-06-07 ENCOUNTER — Other Ambulatory Visit: Payer: Self-pay | Admitting: Oncology

## 2023-06-07 ENCOUNTER — Encounter: Payer: Self-pay | Admitting: Oncology

## 2023-06-07 DIAGNOSIS — D508 Other iron deficiency anemias: Secondary | ICD-10-CM

## 2023-06-07 DIAGNOSIS — D509 Iron deficiency anemia, unspecified: Secondary | ICD-10-CM | POA: Insufficient documentation

## 2023-06-16 ENCOUNTER — Telehealth: Payer: Self-pay

## 2023-06-16 MED ORDER — ENTYVIO 300 MG IV SOLR: 300.0000 mg | INTRAVENOUS | Status: DC

## 2023-06-16 NOTE — Telephone Encounter (Signed)
Called the [facility] connect and they said we needed to get the prescriber enrollment form fax to them with the change and I needed to let the pharmacy know. Transfer to the pharmacy and gave the the verbal change to amber. Faxed the change to [facility] connect and to same day surgery,

## 2023-06-16 NOTE — Telephone Encounter (Signed)
-----   Message from [facility] sent at 06/16/2023  4:34 PM EDT ----- Recommend to increase Entyvio to every 6 weeks  RV

## 2023-06-17 NOTE — Telephone Encounter (Signed)
Called Same day surgery and confirmed they received the order

## 2023-06-17 NOTE — Telephone Encounter (Signed)
Called and left a message for call back  

## 2023-06-17 NOTE — Telephone Encounter (Signed)
Patient verbalized understanding of instructions  

## 2023-06-18 ENCOUNTER — Inpatient Hospital Stay: Payer: PPO | Attending: Oncology | Admitting: Oncology

## 2023-06-18 ENCOUNTER — Telehealth: Payer: Self-pay

## 2023-06-18 ENCOUNTER — Encounter: Payer: Self-pay | Admitting: Oncology

## 2023-06-18 ENCOUNTER — Inpatient Hospital Stay: Payer: PPO

## 2023-06-18 VITALS — BP 147/90 | HR 8

## 2023-06-18 VITALS — BP 136/59 | HR 85 | Temp 96.6°F | Resp 18 | Ht 64.0 in | Wt 148.2 lb

## 2023-06-18 DIAGNOSIS — K625 Hemorrhage of anus and rectum: Secondary | ICD-10-CM | POA: Insufficient documentation

## 2023-06-18 DIAGNOSIS — D509 Iron deficiency anemia, unspecified: Secondary | ICD-10-CM | POA: Diagnosis not present

## 2023-06-18 DIAGNOSIS — D126 Benign neoplasm of colon, unspecified: Secondary | ICD-10-CM | POA: Diagnosis not present

## 2023-06-18 DIAGNOSIS — D500 Iron deficiency anemia secondary to blood loss (chronic): Secondary | ICD-10-CM | POA: Insufficient documentation

## 2023-06-18 DIAGNOSIS — D508 Other iron deficiency anemias: Secondary | ICD-10-CM

## 2023-06-18 MED ORDER — SODIUM CHLORIDE 0.9 % IV SOLN
Freq: Once | INTRAVENOUS | Status: AC
  Filled 2023-06-18: qty 250

## 2023-06-18 MED ORDER — SODIUM CHLORIDE 0.9 % IV SOLN
1000.0000 mg | Freq: Once | INTRAVENOUS | Status: AC
  Administered 2023-06-18: 1000 mg via INTRAVENOUS
  Filled 2023-06-18: qty 1000

## 2023-06-18 NOTE — Progress Notes (Signed)
Hematology/Oncology Consult note [facility]  Telephone:([phone] [phone]  Patient Care Team: Dale Cascade Locks, MD as PCP - General (Internal Medicine) [name], RPH-CPP (Pharmacist) Creig Hines, MD as Consulting Physician (Oncology)   Name of the patient: Ashley Gross  [id number]  06-Dec-1950   Date of visit: 06/18/23  Diagnosis-iron deficiency anemia  Chief complaint/ Reason for visit-routine follow-up of iron deficiency anemia  Heme/Onc history: patient is a 71 year-old female who was referred to GI for symptoms of diarrhea and iron deficiency anemia.  She has a history of inflammatory polyposis of the colon based on colonoscopy in September 2022.  She had some large polyps removed at that time which helped with anemia.  However patient continued to have intermittent rectal bleeding and after seeing Dr. Allegra Lai plan is to try [facility] for improvement of her inflammatory polyposis.      Interval history-patient has had recurrence of rectal bleeding from her inflammatory polyposis.  She is presently getting Entyvio every 6 weeks and states that it is going to be increased to every 4 weeks due to her rectal bleeding symptoms.  ECOG PS- 1 Pain scale- 0 Opioid associated constipation- no  Review of systems- Review of Systems  Constitutional:  Positive for malaise/fatigue. Negative for chills, fever and weight loss.  HENT:  Negative for congestion, ear discharge and nosebleeds.   Eyes:  Negative for blurred vision.  Respiratory:  Negative for cough, hemoptysis, sputum production, shortness of breath and wheezing.   Cardiovascular:  Negative for chest pain, palpitations, orthopnea and claudication.  Gastrointestinal:  Positive for blood in stool. Negative for abdominal pain, constipation, diarrhea, heartburn, melena, nausea and vomiting.  Genitourinary:  Negative for dysuria, flank pain, frequency, hematuria and urgency.   Musculoskeletal:  Negative for back pain, joint pain and myalgias.  Skin:  Negative for rash.  Neurological:  Negative for dizziness, tingling, focal weakness, seizures, weakness and headaches.  Endo/Heme/Allergies:  Does not bruise/bleed easily.  Psychiatric/Behavioral:  Negative for depression and suicidal ideas. The patient does not have insomnia.       Allergies  Allergen Reactions   Aspirin Other (See Comments)    Increased bleeding   Beta Adrenergic Blockers     3rd degree blockage    Lisinopril Cough   Mtx Support [Cobalamine Combinations] Other (See Comments)    Respiratory symptoms   Nsaids    Vasotec [Enalapril] Cough   Verapamil Other (See Comments)    Heart block   Voltaren [Diclofenac Sodium]     Pt unsure of reaction    Erythromycin Other (See Comments)    Gi intolerance   Indocin [Indomethacin]     Upset stomach   Jardiance [Empagliflozin] Other (See Comments)    Recurrent yeast infections, even with washout and rechallenge     Past Medical History:  Diagnosis Date   Abnormal liver function test 10/06/2012   Anemia 01/25/2022   Anemia, unspecified    Asthma    B12 deficiency 02/11/2019   Bleeding internal hemorrhoids 09/13/2017   Choledocholithiasis 06/29/2012   Formatting of this note might be different from the original. Dilated CBD on abdominal imaging 04/2012   Chronic diarrhea 02/16/2013   Colitis    Complication of anesthesia    asthma attack after shoulder surgery   Diabetes mellitus (HCC)    type II   Esophagitis    GERD (gastroesophageal reflux disease)    Heart murmur    for years- nothing to be concerned  about.   History of kidney stones    Hx of arteriovenous malformation (AVM)    Hypertension    IBS (irritable bowel syndrome)    Loss of weight    Pernicious anemia    Personal history of colonic polyps 02/10/2013   02/08/13 colonoscopy - question of rectal varices, two 3-14mm polyps in the sigmoid colon, mass at the hepatic flexure,  one 5mm polyp at the hepatic flexure, nodule at the ileocecal valve, congested mucusa in the entire colon, flattened villi mucosa in the terminal ileum.     Pneumonia    Psoriatic arthritis (HCC)    s/p penicillamine, plaquenil, MTX, sulfasalazine.  s/p Embrel, Humira,.  Iritis.     Pure hypercholesterolemia    Rectal bleeding 07/15/2017   Stroke (HCC) 05/29/2022   per patient     Past Surgical History:  Procedure Laterality Date   ABDOMINAL HYSTERECTOMY  1981   ovaries left in place   ANKLE SURGERY     right   BUNIONECTOMY     CHOLECYSTECTOMY  1989   COLONOSCOPY WITH PROPOFOL N/A 12/27/2017   Procedure: COLONOSCOPY WITH PROPOFOL;  Surgeon: Toney Reil, MD;  Location: [facility] END[facility]COPY;  Service: Gastroenterology;  Laterality: N/A;   COLONOSCOPY WITH PROPOFOL N/A 01/04/2019   Procedure: COLONOSCOPY WITH PROPOFOL;  Surgeon: Toney Reil, MD;  Location: [facility];  Service: Endoscopy;  Laterality: N/A;  Diabetic - oral and injectable   COLONOSCOPY WITH PROPOFOL N/A 07/24/2021   Procedure: COLONOSCOPY WITH PROPOFOL;  Surgeon: Toney Reil, MD;  Location: [facility];  Service: Endoscopy;  Laterality: N/A;  Diabetic   ESOPHAGOGASTRODUODENOSCOPY (EGD) WITH PROPOFOL N/A 12/27/2017   Procedure: ESOPHAGOGASTRODUODENOSCOPY (EGD) WITH PROPOFOL;  Surgeon: Toney Reil, MD;  Location: [facility] ENDOSCOPY;  Service: Gastroenterology;  Laterality: N/A;   HAND SURGERY     right   HEEL SPUR SURGERY     IR 3D INDEPENDENT WKST  10/01/2021   IR ANGIO INTRA EXTRACRAN SEL COM CAROTID INNOMINATE UNI L MOD SED  10/01/2021   IR ANGIO INTRA EXTRACRAN SEL INTERNAL CAROTID UNI R MOD SED  10/01/2021   IR ANGIO INTRA EXTRACRAN SEL INTERNAL CAROTID UNI R MOD SED  05/29/2022   IR ANGIO VERTEBRAL SEL VERTEBRAL BILAT MOD SED  10/01/2021   IR ANGIO VERTEBRAL SEL VERTEBRAL UNI R MOD SED  05/29/2022   IR CT HEAD LTD  10/01/2021   IR TRANSCATH/EMBOLIZ  10/01/2021   IR US GUIDE VASC  ACCESS RIGHT  10/01/2021   IR US GUIDE VASC ACCESS RIGHT  05/29/2022   NOSE SURGERY     x2   POLYPECTOMY  01/04/2019   Procedure: POLYPECTOMY;  Surgeon: Toney Reil, MD;  Location: [facility];  Service: Endoscopy;;   POLYPECTOMY N/A 07/24/2021   Procedure: POLYPECTOMY;  Surgeon: Toney Reil, MD;  Location: [facility];  Service: Endoscopy;  Laterality: N/A;   RADIOLOGY WITH ANESTHESIA N/A 10/01/2021   Procedure: Alfredia Client WITH ANESTHESIA;  Surgeon: Baldemar Lenis, MD;  Location: [facility] OR;  Service: Radiology;  Laterality: N/A;   SHOULDER ARTHROSCOPY WITH OPEN ROTATOR CUFF REPAIR Left 09/20/2018   Procedure: SHOULDER ARTHROSCOPY WITH OPEN ROTATOR CUFF REPAIR;  Surgeon: Christena Flake, MD;  Location: [facility] ORS;  Service: Orthopedics;  Laterality: Left;   SHOULDER CLOSED REDUCTION Left 12/21/2018   Procedure: MANIPULATION UNDER ANESTHESIA WITH STEROID INJECTION;  Surgeon: Christena Flake, MD;  Location: [facility];  Service: Orthopedics;  Laterality: Left;  Diabetic -  insulin and oral meds   UMBILICAL HERNIA REPAIR     XI ROBOTIC ASSISTED INGUINAL HERNIA REPAIR WITH MESH Left 12/16/2021   Procedure: XI ROBOTIC ASSISTED INGUINAL HERNIA REPAIR WITH MESH;  Surgeon: [name], MD;  Location: [facility] ORS;  Service: General;  Laterality: Left;    Social History   Socioeconomic History   Marital status: Widowed    Spouse name: Not on file   Number of children: 2   Years of education: Not on file   Highest education level: Associate degree: occupational, Scientist, product/process development, or vocational program  Occupational History   Not on file  Tobacco Use   Smoking status: Never    Passive exposure: Never   Smokeless tobacco: Never  Vaping Use   Vaping status: Never Used  Substance and Sexual Activity   Alcohol use: Never   Drug use: Never   Sexual activity: Not Currently  Other Topics Concern   Not on file  Social History Narrative   She is married and has  two children   Grand daughter lives with her.   Social Determinants of Health   Financial Resource Strain: Low Risk  (03/11/2023)   Overall Financial Resource Strain (CARDIA)    Difficulty of Paying Living Expenses: Not very hard  Food Insecurity: No Food Insecurity (03/11/2023)   Hunger Vital Sign    Worried About Running Out of Food in the Last Year: Never true    Ran Out of Food in the Last Year: Never true  Transportation Needs: No Transportation Needs (03/11/2023)   PRAPARE - Administrator, Civil Service (Medical): No    Lack of Transportation (Non-Medical): No  Physical Activity: Unknown (03/11/2023)   Exercise Vital Sign    Days of Exercise per Week: 0 days    Minutes of Exercise per Session: Not on file  Stress: Stress Concern Present (03/11/2023)   [employer] of Occupational Health - Occupational Stress Questionnaire    Feeling of Stress : Very much  Social Connections: Moderately Integrated (03/11/2023)   Social Connection and Isolation Panel [NHANES]    Frequency of Communication with Friends and Family: More than three times a week    Frequency of Social Gatherings with Friends and Family: More than three times a week    Attends Religious Services: More than 4 times per year    Active Member of [employer] or Organizations: Yes    Attends Banker Meetings: More than 4 times per year    Marital Status: Widowed  Intimate Partner Violence: Not At Risk (02/19/2023)   Humiliation, Afraid, Rape, and Kick questionnaire    Fear of Current or Ex-Partner: No    Emotionally Abused: No    Physically Abused: No    Sexually Abused: No    Family History  Problem Relation Age of Onset   Diabetes Other    Hypertension Other    Breast cancer Neg Hx    Colon cancer Neg Hx      Current Outpatient Medications:    acetaminophen (TYLENOL) 500 MG tablet, Take 2 tablets (1,000 mg total) by mouth every 6 (six) hours as needed for mild pain., Disp: , Rfl:    albuterol  (VENTOLIN HFA) 108 (90 Base) MCG/ACT inhaler, Inhale 2 puffs into the lungs every 6 (six) hours as needed for wheezing or shortness of breath., Disp: , Rfl:    amLODipine (NORVASC) 5 MG tablet, TAKE 1 TABLET(5 MG) BY MOUTH TWICE DAILY, Disp: 180 tablet, Rfl: 1  Cholecalciferol (VITAMIN D) 50 MCG (2000 UT) tablet, Take 2,000 Units by mouth daily., Disp: , Rfl:    CREON 36000-114000 units CPEP capsule, Take 1-2 capsules (36,000-72,000 Units total) by mouth See admin instructions. Take 720000 units with each meal and 56213 units with snacks, Disp: 240 capsule, Rfl: 3   fluticasone-salmeterol (ADVAIR DISKUS) 100-50 MCG/ACT AEPB, Inhale 1 puff into the lungs 2 (two) times daily., Disp: 1 each, Rfl: 3   gabapentin (NEURONTIN) 600 MG tablet, Take 600 mg by mouth daily., Disp: , Rfl:    insulin degludec (TRESIBA FLEXTOUCH) 100 UNIT/ML FlexTouch Pen, Inject 16 Units into the skin daily., Disp: 15 mL, Rfl: 1   loperamide (IMODIUM) 2 MG capsule, Take 4 mg by mouth daily., Disp: , Rfl:    losartan (COZAAR) 100 MG tablet, TAKE 1 TABLET(100 MG) BY MOUTH AT BEDTIME, Disp: 90 tablet, Rfl: 1   mometasone (ELOCON) 0.1 % ointment, Apply 1 Application topically 2 (two) times daily., Disp: , Rfl:    omeprazole (PRILOSEC) 20 MG capsule, TAKE 1 CAPSULE(20 MG) BY MOUTH TWICE DAILY, Disp: 180 capsule, Rfl: 3   ONE TOUCH ULTRA TEST test strip, PATIENT NEEDS NEW METER STRIPS AND LANCETS FOR ONE TOUCH. HER INSURANCE NO LONGER COVERS ACCUCHEK., Disp: 100 each, Rfl: PRN   Probiotic Product (PROBIOTIC DAILY PO), Take 1 capsule by mouth daily., Disp: , Rfl:    rosuvastatin (CRESTOR) 10 MG tablet, TAKE 1 TABLET(10 MG) BY MOUTH DAILY, Disp: 90 tablet, Rfl: 3   vedolizumab (ENTYVIO) 300 MG injection, Inject 300 mg into the vein every 6 (six) weeks., Disp: , Rfl:   Physical exam:  Vitals:   06/18/23 1419  BP: (!) 136/59  Pulse: 85  Resp: 18  Temp: (!) 96.6 F (35.9 C)  TempSrc: Tympanic  SpO2: 100%  Weight: 148 lb 3.2 oz  (67.2 kg)  Height: 5\' 4"  (1.626 m)   Physical Exam Cardiovascular:     Rate and Rhythm: Normal rate and regular rhythm.     Heart sounds: Normal heart sounds.  Pulmonary:     Effort: Pulmonary effort is normal.     Breath sounds: Normal breath sounds.  Skin:    General: Skin is warm and dry.  Neurological:     Mental Status: She is alert and oriented to person, place, and time.         Latest Ref Rng & Units 04/30/2023    8:02 AM  CMP  Glucose 70 - 99 mg/dL 086   BUN 6 - 23 mg/dL 16   Creatinine 5.78 - 1.20 mg/dL 4.69   Sodium 629 - 528 mEq/L 143   Potassium 3.5 - 5.1 mEq/L 3.8   Chloride 96 - 112 mEq/L 108   CO2 19 - 32 mEq/L 29   Calcium 8.4 - 10.5 mg/dL 8.6   Total Protein 6.0 - 8.3 g/dL 5.9   Total Bilirubin 0.2 - 1.2 mg/dL 0.4   Alkaline Phos 39 - 117 U/L 99   AST 0 - 37 U/L 23   ALT 0 - 35 U/L 15       Latest Ref Rng & Units 06/02/2023    3:02 PM  CBC  WBC 3.4 - 10.8 x10E3/uL 4.6   Hemoglobin 11.1 - 15.9 g/dL 41.3   Hematocrit 24.4 - 46.6 % 32.4   Platelets 150 - 450 x10E3/uL 242       No results found.   Assessment and plan- Patient is a 72 y.o. female here for routine follow-up of  iron deficiency anemia  Iron deficiency anemia secondary to rectal bleeding from inflammatory polyposis.  She follows up with GI for this and is on Entyvio.  We will proceed with 1 dose of Monoferric today.  CBC ferritin and iron studies in 3 and 6 months and I will see her back in 6 months   Visit Diagnosis 1. Iron deficiency anemia, unspecified iron deficiency anemia type      Dr. Owens Shark, MD, MPH [facility] [phone] 06/18/2023 2:53 PM

## 2023-06-18 NOTE — Telephone Encounter (Signed)
2 boxes of pt assistance [place] received. Pt is aware and says she will pick up Monday. Labeled and placed in refrigerator.

## 2023-06-18 NOTE — Patient Instructions (Signed)

## 2023-06-21 ENCOUNTER — Ambulatory Visit
Admission: RE | Admit: 2023-06-21 | Discharge: 2023-06-21 | Disposition: A | Discharge status: Home / Self Care | Payer: PPO | Source: Ambulatory Visit | Attending: Internal Medicine | Admitting: Internal Medicine

## 2023-06-21 DIAGNOSIS — Z1231 Encounter for screening mammogram for malignant neoplasm of breast: Secondary | ICD-10-CM | POA: Insufficient documentation

## 2023-06-21 NOTE — Telephone Encounter (Signed)
Pt picked up 2 boxes of [place] on 8/19 @2 :37 pm.

## 2023-06-23 ENCOUNTER — Telehealth: Payer: Self-pay | Admitting: Internal Medicine

## 2023-06-23 NOTE — Telephone Encounter (Signed)
Copied from CRM [id number]. Topic: Medicare AWV >> Jun 23, 2023 10:18 AM Payton Doughty wrote: Reason for CRM: LM 06/23/2023 to schedule AWV   Verlee Rossetti; Care Guide Ambulatory Clinical Support [place] [facility] Direct Dial: [phone]

## 2023-06-27 DIAGNOSIS — G4733 Obstructive sleep apnea (adult) (pediatric): Secondary | ICD-10-CM | POA: Diagnosis not present

## 2023-07-02 ENCOUNTER — Ambulatory Visit
Admission: RE | Admit: 2023-07-02 | Discharge: 2023-07-02 | Disposition: A | Discharge status: Home / Self Care | Payer: PPO | Source: Ambulatory Visit | Attending: Gastroenterology | Admitting: Gastroenterology

## 2023-07-02 DIAGNOSIS — D126 Benign neoplasm of colon, unspecified: Secondary | ICD-10-CM | POA: Insufficient documentation

## 2023-07-02 DIAGNOSIS — K625 Hemorrhage of anus and rectum: Secondary | ICD-10-CM | POA: Diagnosis not present

## 2023-07-02 MED ORDER — VEDOLIZUMAB 300 MG IV SOLR
300.0000 mg | Freq: Once | INTRAVENOUS | Status: AC
  Administered 2023-07-02: 300 mg via INTRAVENOUS
  Filled 2023-07-02: qty 5

## 2023-07-08 ENCOUNTER — Telehealth (HOSPITAL_COMMUNITY): Payer: Self-pay

## 2023-07-08 NOTE — Telephone Encounter (Signed)
Called pt regarding recent imaging, no answer, left vm. AB  

## 2023-07-08 NOTE — Telephone Encounter (Signed)
Per Dr. Quay Burow, recent mra looks fine, recommends f/u mra in 3 years. Ms. Ohlmann said that she thinks her PCP plans to do another MRA in 2 years to be safe. I will keep a check and have Dr. Quay Burow review when that is done. AB

## 2023-07-16 ENCOUNTER — Ambulatory Visit: Admission: RE | Admit: 2023-07-16 | Payer: PPO | Source: Ambulatory Visit

## 2023-07-19 ENCOUNTER — Ambulatory Visit (INDEPENDENT_AMBULATORY_CARE_PROVIDER_SITE_OTHER): Payer: PPO | Admitting: Family Medicine

## 2023-07-19 ENCOUNTER — Encounter: Payer: Self-pay | Admitting: Family Medicine

## 2023-07-19 VITALS — BP 136/82 | HR 67 | Temp 98.0°F | Ht 64.0 in | Wt 150.0 lb

## 2023-07-19 DIAGNOSIS — J309 Allergic rhinitis, unspecified: Secondary | ICD-10-CM | POA: Diagnosis not present

## 2023-07-19 DIAGNOSIS — R42 Dizziness and giddiness: Secondary | ICD-10-CM | POA: Diagnosis not present

## 2023-07-19 MED ORDER — AZELASTINE HCL 0.1 % NA SOLN
2.0000 | Freq: Two times a day (BID) | NASAL | 0 refills | Status: DC
Start: 2023-07-19 — End: 2023-11-22

## 2023-07-19 NOTE — Assessment & Plan Note (Signed)
This issue has a wide differential.  Discussed that this could be related to an inner ear issue such as BPPV or eustachian tube dysfunction.  Discussed her allergies could be playing a role.  Less likely would be related to an intracranial process with a relatively recent MRI.  Less likely to be related to anemia given her description of the dizziness.  We will check her CBC today.  We will treat her allergies as well.

## 2023-07-19 NOTE — Progress Notes (Signed)
Marikay Alar, MD Phone: [phone]  RHILYN BERTIN is a 72 y.o. female who presents today for same-day visit.  Dizziness: Patient reports this has been going on intermittently for several months.  It occurs most days.  She has some balance issues some of the days that some days the balance is fine.  She notes at times she will have room spinning sensation that lasts for 2 to 3 minutes.  It does not improve if she closes her eyes.  Notably she had an MRI/MRA in July that revealed a stable meningioma and no aneurysms.  She does have a history of allergies though does not take anything consistently for this.  Nasal congestion: Patient notes this has been going on a couple of days.  She has had some eye discharge as well as rhinorrhea, hoarseness, and postnasal drip.  Some sneezing.  She noted a small amount of green discharge from her left eye as well.  Social History   Tobacco Use  Smoking Status Never  . Passive exposure: Never  Smokeless Tobacco Never    Current Outpatient Medications on File Prior to Visit  Medication Sig Dispense Refill  . acetaminophen (TYLENOL) 500 MG tablet Take 2 tablets (1,000 mg total) by mouth every 6 (six) hours as needed for mild pain.    Marland Kitchen albuterol (VENTOLIN HFA) 108 (90 Base) MCG/ACT inhaler Inhale 2 puffs into the lungs every 6 (six) hours as needed for wheezing or shortness of breath.    Marland Kitchen amLODipine (NORVASC) 5 MG tablet TAKE 1 TABLET(5 MG) BY MOUTH TWICE DAILY 180 tablet 1  . Cholecalciferol (VITAMIN D) 50 MCG (2000 UT) tablet Take 2,000 Units by mouth daily.    Marland Kitchen CREON 36000-114000 units CPEP capsule Take 1-2 capsules (36,000-72,000 Units total) by mouth See admin instructions. Take 720000 units with each meal and 65784 units with snacks 240 capsule 3  . fluticasone-salmeterol (ADVAIR DISKUS) 100-50 MCG/ACT AEPB Inhale 1 puff into the lungs 2 (two) times daily. 1 each 3  . gabapentin (NEURONTIN) 600 MG tablet Take 600 mg by mouth daily.    .  insulin degludec (TRESIBA FLEXTOUCH) 100 UNIT/ML FlexTouch Pen Inject 16 Units into the skin daily. 15 mL 1  . loperamide (IMODIUM) 2 MG capsule Take 4 mg by mouth daily.    Marland Kitchen losartan (COZAAR) 100 MG tablet TAKE 1 TABLET(100 MG) BY MOUTH AT BEDTIME 90 tablet 1  . mometasone (ELOCON) 0.1 % ointment Apply 1 Application topically 2 (two) times daily.    Marland Kitchen omeprazole (PRILOSEC) 20 MG capsule TAKE 1 CAPSULE(20 MG) BY MOUTH TWICE DAILY 180 capsule 3  . ONE TOUCH ULTRA TEST test strip PATIENT NEEDS NEW METER STRIPS AND LANCETS FOR ONE TOUCH. HER INSURANCE NO LONGER COVERS ACCUCHEK. 100 each PRN  . Probiotic Product (PROBIOTIC DAILY PO) Take 1 capsule by mouth daily.    . rosuvastatin (CRESTOR) 10 MG tablet TAKE 1 TABLET(10 MG) BY MOUTH DAILY 90 tablet 3  . vedolizumab (ENTYVIO) 300 MG injection Inject 300 mg into the vein every 6 (six) weeks.     No current facility-administered medications on file prior to visit.     ROS see history of present illness  Objective  Physical Exam Vitals:   07/19/23 1337  BP: 136/82  Pulse: 67  Temp: 98 F (36.7 C)  SpO2: 97%    BP Readings from Last 3 Encounters:  07/19/23 136/82  06/18/23 (!) 147/90  06/18/23 (!) 136/59   Wt Readings from Last 3 Encounters:  07/19/23 150 lb (68 kg)  06/18/23 148 lb 3.2 oz (67.2 kg)  06/02/23 148 lb 6 oz (67.3 kg)    Physical Exam Constitutional:      General: She is not in acute distress.    Appearance: She is not diaphoretic.  HENT:     Right Ear: Tympanic membrane normal.     Left Ear: Tympanic membrane normal.     Ears:     Comments: Negative Dix-Hallpike Eyes:     Comments: Bilateral eyes with mild watery discharge, some conjunctival erythema bilateral eyes, no gross corneal changes, PERRL  Cardiovascular:     Rate and Rhythm: Normal rate and regular rhythm.     Heart sounds: Normal heart sounds.  Pulmonary:     Effort: Pulmonary effort is normal.     Breath sounds: Normal breath sounds.  Skin:     General: Skin is warm and dry.  Neurological:     Mental Status: She is alert.     Assessment/Plan: Please see individual problem list.  Dizziness Assessment & Plan: This issue has a wide differential.  Discussed that this could be related to an inner ear issue such as BPPV or eustachian tube dysfunction.  Discussed her allergies could be playing a role.  Less likely would be related to an intracranial process with a relatively recent MRI.  Less likely to be related to anemia given her description of the dizziness.  We will check her CBC today.  We will treat her allergies as well.  Orders: -     CBC -     Basic metabolic panel  Allergic rhinitis, unspecified seasonality, unspecified trigger Assessment & Plan: Suspect upper respiratory symptoms are related to allergies.  She will start Zyrtec.  She will trial Astelin nasal spray.  If not helpful she will let us know.  Orders: -     Azelastine HCl; Place 2 sprays into both nostrils 2 (two) times daily. Use in each nostril as directed  Dispense: 30 mL; Refill: 0    Return if symptoms worsen or fail to improve.   Marikay Alar, MD [facility]

## 2023-07-19 NOTE — Assessment & Plan Note (Signed)
Suspect upper respiratory symptoms are related to allergies.  She will start Zyrtec.  She will trial Astelin nasal spray.  If not helpful she will let us know.

## 2023-07-19 NOTE — Patient Instructions (Signed)
Nice to see you. I would like for you to start on Zyrtec over-the-counter.  We are also going to start you on Astelin nasal spray to see if that helps with your allergy symptoms.  It may be that getting on these medicines helps with your dizziness as well. Will contact you with your lab results.

## 2023-07-20 LAB — BASIC METABOLIC PANEL
BUN: 22 mg/dL (ref 6–23)
CO2: 27 meq/L (ref 19–32)
Calcium: 8.1 mg/dL — ABNORMAL LOW (ref 8.4–10.5)
Chloride: 106 meq/L (ref 96–112)
Creatinine, Ser: 1.1 mg/dL (ref 0.40–1.20)
GFR: 50.21 mL/min — ABNORMAL LOW (ref >60.00)
Glucose, Bld: 73 mg/dL (ref 70–99)
Potassium: 4 meq/L (ref 3.5–5.1)
Sodium: 140 meq/L (ref 135–145)

## 2023-07-20 LAB — CBC
HCT: 38.1 % (ref 36.0–46.0)
Hemoglobin: 12.3 g/dL (ref 12.0–15.0)
MCHC: 32.2 g/dL (ref 30.0–36.0)
MCV: 88.5 fl (ref 78.0–100.0)
Platelets: 237 10*3/uL (ref 150.0–400.0)
RBC: 4.31 Mil/uL (ref 3.87–5.11)
RDW: 18.8 % — ABNORMAL HIGH (ref 11.5–15.5)
WBC: 5.6 10*3/uL (ref 4.0–10.5)

## 2023-07-21 ENCOUNTER — Telehealth: Payer: Self-pay | Admitting: Internal Medicine

## 2023-07-21 NOTE — Telephone Encounter (Signed)
Patient just called and said she looked at her MyChart lab results and she would like for someone to go over it with her. She wants someone to go over her results with her. Her number is [phone]. And what all she can do to improve her results.

## 2023-07-22 ENCOUNTER — Other Ambulatory Visit: Payer: Self-pay | Admitting: Family Medicine

## 2023-07-22 ENCOUNTER — Other Ambulatory Visit: Payer: Self-pay | Admitting: Family

## 2023-07-22 ENCOUNTER — Ambulatory Visit (INDEPENDENT_AMBULATORY_CARE_PROVIDER_SITE_OTHER): Payer: PPO

## 2023-07-22 DIAGNOSIS — N179 Acute kidney failure, unspecified: Secondary | ICD-10-CM

## 2023-07-22 DIAGNOSIS — Z23 Encounter for immunization: Secondary | ICD-10-CM | POA: Diagnosis not present

## 2023-07-22 NOTE — Telephone Encounter (Signed)
Discussed with patient that question has been sent to Dr Birdie Sons and would let her know what his response is. Patient stated this is ok. No rush

## 2023-07-27 ENCOUNTER — Ambulatory Visit: Payer: PPO | Admitting: Gastroenterology

## 2023-07-28 DIAGNOSIS — B351 Tinea unguium: Secondary | ICD-10-CM | POA: Diagnosis not present

## 2023-07-28 DIAGNOSIS — M778 Other enthesopathies, not elsewhere classified: Secondary | ICD-10-CM | POA: Diagnosis not present

## 2023-07-28 DIAGNOSIS — L909 Atrophic disorder of skin, unspecified: Secondary | ICD-10-CM | POA: Diagnosis not present

## 2023-07-28 DIAGNOSIS — G4733 Obstructive sleep apnea (adult) (pediatric): Secondary | ICD-10-CM | POA: Diagnosis not present

## 2023-07-28 DIAGNOSIS — L600 Ingrowing nail: Secondary | ICD-10-CM | POA: Diagnosis not present

## 2023-07-28 DIAGNOSIS — Q6689 Other  specified congenital deformities of feet: Secondary | ICD-10-CM | POA: Diagnosis not present

## 2023-07-29 ENCOUNTER — Other Ambulatory Visit (INDEPENDENT_AMBULATORY_CARE_PROVIDER_SITE_OTHER): Payer: PPO

## 2023-07-29 DIAGNOSIS — N179 Acute kidney failure, unspecified: Secondary | ICD-10-CM

## 2023-07-30 LAB — BASIC METABOLIC PANEL
BUN: 15 mg/dL (ref 6–23)
CO2: 25 meq/L (ref 19–32)
Calcium: 8.4 mg/dL (ref 8.4–10.5)
Chloride: 109 meq/L (ref 96–112)
Creatinine, Ser: 0.87 mg/dL (ref 0.40–1.20)
GFR: 66.52 mL/min (ref >60.00)
Glucose, Bld: 98 mg/dL (ref 70–99)
Potassium: 3.8 meq/L (ref 3.5–5.1)
Sodium: 143 meq/L (ref 135–145)

## 2023-07-30 NOTE — Progress Notes (Signed)
Medication added per Dr. Birdie Sons request see result note.

## 2023-08-03 ENCOUNTER — Telehealth: Payer: Self-pay | Admitting: Internal Medicine

## 2023-08-03 NOTE — Telephone Encounter (Signed)
Copied from CRM [id number]. Topic: Medicare AWV >> Aug 03, 2023 10:24 AM Payton Doughty wrote: Reason for CRM: Called LM 08/03/2023 to schedule AWV   Verlee Rossetti; Care Guide Ambulatory Clinical Support [place] [facility] Direct Dial: [phone]

## 2023-08-12 DIAGNOSIS — Z961 Presence of intraocular lens: Secondary | ICD-10-CM | POA: Diagnosis not present

## 2023-08-12 DIAGNOSIS — E119 Type 2 diabetes mellitus without complications: Secondary | ICD-10-CM | POA: Diagnosis not present

## 2023-08-12 DIAGNOSIS — H43813 Vitreous degeneration, bilateral: Secondary | ICD-10-CM | POA: Diagnosis not present

## 2023-08-12 DIAGNOSIS — H04123 Dry eye syndrome of bilateral lacrimal glands: Secondary | ICD-10-CM | POA: Diagnosis not present

## 2023-08-13 ENCOUNTER — Ambulatory Visit
Admission: RE | Admit: 2023-08-13 | Discharge: 2023-08-13 | Disposition: A | Discharge status: Home / Self Care | Payer: PPO | Source: Ambulatory Visit | Attending: Gastroenterology | Admitting: Gastroenterology

## 2023-08-13 DIAGNOSIS — K635 Polyp of colon: Secondary | ICD-10-CM | POA: Diagnosis not present

## 2023-08-13 DIAGNOSIS — Z79620 Long term (current) use of immunosuppressive biologic: Secondary | ICD-10-CM | POA: Diagnosis not present

## 2023-08-13 MED ORDER — VEDOLIZUMAB 300 MG IV SOLR
300.0000 mg | Freq: Once | INTRAVENOUS | Status: AC
  Administered 2023-08-13: 300 mg via INTRAVENOUS
  Filled 2023-08-13: qty 5

## 2023-08-13 NOTE — Progress Notes (Signed)
Per, Dr. Blake Divine, patient can still get entyvio infusion today 08/13/2023  even if she had Flu vaccine on Sept.19,2024.

## 2023-08-13 NOTE — Progress Notes (Signed)
Entyvio infusion completed . Vital sign stable.Patient tolerated and no signs of acute distress.

## 2023-08-18 ENCOUNTER — Ambulatory Visit: Payer: PPO | Admitting: Vascular Surgery

## 2023-08-18 ENCOUNTER — Encounter: Payer: Self-pay | Admitting: Vascular Surgery

## 2023-08-18 ENCOUNTER — Ambulatory Visit (HOSPITAL_COMMUNITY)
Admission: RE | Admit: 2023-08-18 | Discharge: 2023-08-18 | Disposition: A | Discharge status: Home / Self Care | Payer: PPO | Source: Ambulatory Visit | Attending: Vascular Surgery | Admitting: Vascular Surgery

## 2023-08-18 VITALS — BP 137/81 | HR 84 | Temp 99.0°F | Ht 64.0 in | Wt 151.7 lb

## 2023-08-18 DIAGNOSIS — M7989 Other specified soft tissue disorders: Secondary | ICD-10-CM

## 2023-08-18 DIAGNOSIS — I872 Venous insufficiency (chronic) (peripheral): Secondary | ICD-10-CM | POA: Diagnosis not present

## 2023-08-18 NOTE — Progress Notes (Signed)
Patient ID: Ashley Gross, female   DOB: [birth year], 72 y.o.   MRN: [id number]  Reason for Consult: Follow-up and PAD   Referred by Dale Min[place], MD  Subjective:     HPI:  Ashley Gross is a 72 y.o. female with bilateral lower extremity edema with blotchy discoloration that is associated with the swelling.  She has been wearing thigh-high compression stockings since our last evaluation with minimal improvement.  No previous venous interventions or DVT.  Does not have any venous ulceration.  Past Medical History:  Diagnosis Date   Abnormal liver function test 10/06/2012   Anemia 01/25/2022   Anemia, unspecified    Asthma    B12 deficiency 02/11/2019   Bleeding internal hemorrhoids 09/13/2017   Choledocholithiasis 06/29/2012   Formatting of this note might be different from the original. Dilated CBD on abdominal imaging 04/2012   Chronic diarrhea 02/16/2013   Colitis    Complication of anesthesia    asthma attack after shoulder surgery   Diabetes mellitus (HCC)    type II   Esophagitis    GERD (gastroesophageal reflux disease)    Heart murmur    for years- nothing to be concerned about.   History of kidney stones    Hx of arteriovenous malformation (AVM)    Hypertension    IBS (irritable bowel syndrome)    Loss of weight    Peripheral vascular disease (HCC)    Pernicious anemia    Personal history of colonic polyps 02/10/2013   02/08/13 colonoscopy - question of rectal varices, two 3-65mm polyps in the sigmoid colon, mass at the hepatic flexure, one 5mm polyp at the hepatic flexure, nodule at the ileocecal valve, congested mucusa in the entire colon, flattened villi mucosa in the terminal ileum.     Pneumonia    Psoriatic arthritis (HCC)    s/p penicillamine, plaquenil, MTX, sulfasalazine.  s/p Embrel, Humira,.  Iritis.     Pure hypercholesterolemia    Rectal bleeding 07/15/2017   Stroke (HCC) 05/29/2022   per patient   Family History  Problem Relation Age of  Onset   Diabetes Other    Hypertension Other    Breast cancer Neg Hx    Colon cancer Neg Hx    Past Surgical History:  Procedure Laterality Date   ABDOMINAL HYSTERECTOMY  1981   ovaries left in place   ANKLE SURGERY     right   BUNIONECTOMY     CHOLECYSTECTOMY  1989   COLONOSCOPY WITH PROPOFOL N/A 12/27/2017   Procedure: COLONOSCOPY WITH PROPOFOL;  Surgeon: Toney Reil, MD;  Location: [facility] END[facility];  Service: Gastroenterology;  Laterality: N/A;   COLONOSCOPY WITH PROPOFOL N/A 01/04/2019   Procedure: COLONOSCOPY WITH PROPOFOL;  Surgeon: Toney Reil, MD;  Location: [facility];  Service: Endoscopy;  Laterality: N/A;  Diabetic - oral and injectable   COLONOSCOPY WITH PROPOFOL N/A 07/24/2021   Procedure: COLONOSCOPY WITH PROPOFOL;  Surgeon: Toney Reil, MD;  Location: [facility];  Service: Endoscopy;  Laterality: N/A;  Diabetic   ESOPHAGOGASTRODUODENOSCOPY (EGD) WITH PROPOFOL N/A 12/27/2017   Procedure: ESOPHAGOGASTRODUODENOSCOPY (EGD) WITH PROPOFOL;  Surgeon: Toney Reil, MD;  Location: [facility];  Service: Gastroenterology;  Laterality: N/A;   HAND SURGERY     right   HEEL SPUR SURGERY     IR 3D INDEPENDENT WKST  10/01/2021   IR ANGIO INTRA EXTRACRAN SEL COM CAROTID INNOMINATE UNI L MOD SED  10/01/2021   IR ANGIO INTRA EXTRACRAN  SEL INTERNAL CAROTID UNI R MOD SED  10/01/2021   IR ANGIO INTRA EXTRACRAN SEL INTERNAL CAROTID UNI R MOD SED  05/29/2022   IR ANGIO VERTEBRAL SEL VERTEBRAL BILAT MOD SED  10/01/2021   IR ANGIO VERTEBRAL SEL VERTEBRAL UNI R MOD SED  05/29/2022   IR CT HEAD LTD  10/01/2021   IR TRANSCATH/EMBOLIZ  10/01/2021   IR US GUIDE VASC ACCESS RIGHT  10/01/2021   IR US GUIDE VASC ACCESS RIGHT  05/29/2022   NOSE SURGERY     x2   POLYPECTOMY  01/04/2019   Procedure: POLYPECTOMY;  Surgeon: Toney Reil, MD;  Location: [facility];  Service: Endoscopy;;   POLYPECTOMY N/A 07/24/2021   Procedure: POLYPECTOMY;   Surgeon: Toney Reil, MD;  Location: [facility];  Service: Endoscopy;  Laterality: N/A;   RADIOLOGY WITH ANESTHESIA N/A 10/01/2021   Procedure: Alfredia Client WITH ANESTHESIA;  Surgeon: Baldemar Lenis, MD;  Location: [facility] OR;  Service: Radiology;  Laterality: N/A;   SHOULDER ARTHROSCOPY WITH OPEN ROTATOR CUFF REPAIR Left 09/20/2018   Procedure: SHOULDER ARTHROSCOPY WITH OPEN ROTATOR CUFF REPAIR;  Surgeon: Christena Flake, MD;  Location: [facility] OR[facility];  Service: Orthopedics;  Laterality: Left;   SHOULDER CLOSED REDUCTION Left 12/21/2018   Procedure: MANIPULATION UNDER ANESTHESIA WITH STEROID INJECTION;  Surgeon: Christena Flake, MD;  Location: [facility];  Service: Orthopedics;  Laterality: Left;  Diabetic - insulin and oral meds   UMBILICAL HERNIA REPAIR     XI ROBOTIC ASSISTED INGUINAL HERNIA REPAIR WITH MESH Left 12/16/2021   Procedure: XI ROBOTIC ASSISTED INGUINAL HERNIA REPAIR WITH MESH;  Surgeon: [name], MD;  Location: [facility] ORS;  Service: General;  Laterality: Left;    Short Social History:  Social History   Tobacco Use   Smoking status: Never    Passive exposure: Never   Smokeless tobacco: Never  Substance Use Topics   Alcohol use: Never    Allergies  Allergen Reactions   Aspirin Other (See Comments)    Increased bleeding   Beta Adrenergic Blockers     3rd degree blockage    Lisinopril Cough   Mtx Support [Cobalamine Combinations] Other (See Comments)    Respiratory symptoms   Nsaids    Vasotec [Enalapril] Cough   Verapamil Other (See Comments)    Heart block   Voltaren [Diclofenac Sodium]     Pt unsure of reaction    Erythromycin Other (See Comments)    Gi intolerance   Indocin [Indomethacin]     Upset stomach   Jardiance [Empagliflozin] Other (See Comments)    Recurrent yeast infections, even with washout and rechallenge    Current Outpatient Medications  Medication Sig Dispense Refill   acetaminophen (TYLENOL) 500 MG tablet  Take 2 tablets (1,000 mg total) by mouth every 6 (six) hours as needed for mild pain.     albuterol (VENTOLIN HFA) 108 (90 Base) MCG/ACT inhaler Inhale 2 puffs into the lungs every 6 (six) hours as needed for wheezing or shortness of breath.     amLODipine (NORVASC) 5 MG tablet TAKE 1 TABLET(5 MG) BY MOUTH TWICE DAILY 180 tablet 1   azelastine (ASTELIN) 0.1 % nasal spray Place 2 sprays into both nostrils 2 (two) times daily. Use in each nostril as directed 30 mL 0   cetirizine (ZYRTEC) 10 MG tablet Take 10 mg by mouth daily.     Cholecalciferol (VITAMIN D) 50 MCG (2000 UT) tablet Take 2,000 Units by mouth daily.  CREON 36000-114000 units CPEP capsule Take 1-2 capsules (36,000-72,000 Units total) by mouth See admin instructions. Take 720000 units with each meal and 16109 units with snacks 240 capsule 3   fluticasone-salmeterol (ADVAIR DISKUS) 100-50 MCG/ACT AEPB Inhale 1 puff into the lungs 2 (two) times daily. 1 each 3   gabapentin (NEURONTIN) 600 MG tablet Take 600 mg by mouth daily.     insulin degludec (TRESIBA FLEXTOUCH) 100 UNIT/ML FlexTouch Pen Inject 16 Units into the skin daily. 15 mL 1   loperamide (IMODIUM) 2 MG capsule Take 4 mg by mouth daily.     losartan (COZAAR) 100 MG tablet TAKE 1 TABLET(100 MG) BY MOUTH AT BEDTIME 90 tablet 1   mometasone (ELOCON) 0.1 % ointment Apply 1 Application topically 2 (two) times daily.     omeprazole (PRILOSEC) 20 MG capsule TAKE 1 CAPSULE(20 MG) BY MOUTH TWICE DAILY 180 capsule 3   ONE TOUCH ULTRA TEST test strip PATIENT NEEDS NEW METER STRIPS AND LANCETS FOR ONE TOUCH. HER INSURANCE NO LONGER COVERS ACCUCHEK. 100 each PRN   rosuvastatin (CRESTOR) 10 MG tablet TAKE 1 TABLET(10 MG) BY MOUTH DAILY 90 tablet 3   vedolizumab (ENTYVIO) 300 MG injection Inject 300 mg into the vein every 6 (six) weeks.     Probiotic Product (PROBIOTIC DAILY PO) Take 1 capsule by mouth daily.     No current facility-administered medications for this visit.    Review of  Systems  Constitutional:  Constitutional negative. HENT: HENT negative.  Eyes: Eyes negative.  Respiratory: Respiratory negative.  Cardiovascular: Positive for leg swelling.  GI: Gastrointestinal negative.  Musculoskeletal: Positive for leg pain.  Neurological: Neurological negative. Hematologic: Hematologic/lymphatic negative.  Psychiatric: Psychiatric negative.        Objective:  Objective   Vitals:   08/18/23 1525  BP: 137/81  Pulse: 84  Temp: 99 F (37.2 C)  SpO2: 97%     Physical Exam HENT:     Head: Normocephalic.     Nose: Nose normal.  Eyes:     Pupils: Pupils are equal, round, and reactive to light.  Cardiovascular:     Rate and Rhythm: Tachycardia present.  Pulmonary:     Effort: Pulmonary effort is normal.  Abdominal:     General: Abdomen is flat.  Musculoskeletal:     Cervical back: Normal range of motion.     Right lower leg: Edema present.     Left lower leg: Edema present.  Skin:    General: Skin is warm.     Capillary Refill: Capillary refill takes less than 2 seconds.  Neurological:     General: No focal deficit present.     Mental Status: She is alert.  Psychiatric:        Mood and Affect: Mood normal.        Behavior: Behavior normal.        Thought Content: Thought content normal.        Judgment: Judgment normal.     Data: LEFT          Reflux NoRefluxReflux TimeDiameter cmsComments                               Yes                                        +--------------+---------+------+-----------+------------+-------------+  CFV  yes   >1 second                            +--------------+---------+------+-----------+------------+-------------+  FV prox       no                                                   +--------------+---------+------+-----------+------------+-------------+  FV mid        no                                                    +--------------+---------+------+-----------+------------+-------------+  FV dist       no                                                   +--------------+---------+------+-----------+------------+-------------+  Popliteal    no                                                   +--------------+---------+------+-----------+------------+-------------+  GSV at SFJ              yes    >500 ms     0.766                   +--------------+---------+------+-----------+------------+-------------+  GSV prox thighno                           0.415                   +--------------+---------+------+-----------+------------+-------------+  GSV mid thigh no                            0.31    out of fascia  +--------------+---------+------+-----------+------------+-------------+  GSV dist thighno                           0.338                   +--------------+---------+------+-----------+------------+-------------+  GSV at knee   no                            0.26                   +--------------+---------+------+-----------+------------+-------------+  GSV prox calf no                           0.224                   +--------------+---------+------+-----------+------------+-------------+  SSV Pop Fossa no                           0.333                   +--------------+---------+------+-----------+------------+-------------+  SSV prox calf no                           0.178                   +--------------+---------+------+-----------+------------+-------------+  SSV mid calf  no                           0.201                   +--------------+---------+------+-----------+------------+-------------+        Summary:  Left:  - No evidence of deep vein thrombosis seen in the left lower extremity,  from the common femoral through the popliteal veins.  - No evidence of superficial venous thrombosis in the left lower   extremity.    - Deep vein reflux in the CFV.   - Superficial vein reflux in the SFJ.         Assessment/Plan:    72 year old female with persistent edema of her bilateral lower extremities with concomitant blotchy discoloration without significant reflux for intervention.  I have recommended continued compression stockings and elevation when recumbent.  She demonstrates good understanding can see me on an as-needed basis.     Maeola Harman MD [facility]

## 2023-08-22 ENCOUNTER — Other Ambulatory Visit: Payer: Self-pay | Admitting: Internal Medicine

## 2023-08-26 DIAGNOSIS — G4733 Obstructive sleep apnea (adult) (pediatric): Secondary | ICD-10-CM | POA: Diagnosis not present

## 2023-09-06 ENCOUNTER — Telehealth: Payer: Self-pay

## 2023-09-06 ENCOUNTER — Telehealth: Payer: Self-pay | Admitting: Primary Care

## 2023-09-06 NOTE — Telephone Encounter (Signed)
Needs CPAP follow-up for compliance check, virtual ok

## 2023-09-06 NOTE — Telephone Encounter (Signed)
Patient assistance for creon is expiring 11/02/2023 and need to reapply for next year.Patient will come Wednesday to sign the application

## 2023-09-06 NOTE — Telephone Encounter (Signed)
Patient entyvio patient assistance is expiring at the end of the year 11/02/2023. Called patient and she will come on Wednesday to sign the application and will also bring the W2 forms to make a copy of.

## 2023-09-08 ENCOUNTER — Other Ambulatory Visit (INDEPENDENT_AMBULATORY_CARE_PROVIDER_SITE_OTHER): Payer: PPO

## 2023-09-08 DIAGNOSIS — E1165 Type 2 diabetes mellitus with hyperglycemia: Secondary | ICD-10-CM | POA: Diagnosis not present

## 2023-09-08 DIAGNOSIS — E7800 Pure hypercholesterolemia, unspecified: Secondary | ICD-10-CM

## 2023-09-08 DIAGNOSIS — Z794 Long term (current) use of insulin: Secondary | ICD-10-CM

## 2023-09-08 LAB — HEMOGLOBIN A1C: Hgb A1c MFr Bld: 6.8 % — ABNORMAL HIGH (ref 4.6–6.5)

## 2023-09-08 LAB — HEPATIC FUNCTION PANEL
ALT: 14 U/L (ref 0–35)
AST: 18 U/L (ref 0–37)
Albumin: 3.5 g/dL (ref 3.5–5.2)
Alkaline Phosphatase: 94 U/L (ref 39–117)
Bilirubin, Direct: 0.1 mg/dL (ref 0.0–0.3)
Total Bilirubin: 0.4 mg/dL (ref 0.2–1.2)
Total Protein: 5.8 g/dL — ABNORMAL LOW (ref 6.0–8.3)

## 2023-09-08 LAB — BASIC METABOLIC PANEL
BUN: 23 mg/dL (ref 6–23)
CO2: 27 meq/L (ref 19–32)
Calcium: 8.3 mg/dL — ABNORMAL LOW (ref 8.4–10.5)
Chloride: 107 meq/L (ref 96–112)
Creatinine, Ser: 1.24 mg/dL — ABNORMAL HIGH (ref 0.40–1.20)
GFR: 43.44 mL/min — ABNORMAL LOW (ref >60.00)
Glucose, Bld: 192 mg/dL — ABNORMAL HIGH (ref 70–99)
Potassium: 4.1 meq/L (ref 3.5–5.1)
Sodium: 142 meq/L (ref 135–145)

## 2023-09-08 LAB — LIPID PANEL
Cholesterol: 124 mg/dL (ref 0–200)
HDL: 47.7 mg/dL (ref >39.00)
LDL Cholesterol: 54 mg/dL (ref 0–99)
NonHDL: 76.36
Total CHOL/HDL Ratio: 3
Triglycerides: 112 mg/dL (ref 0.0–149.0)
VLDL: 22.4 mg/dL (ref 0.0–40.0)

## 2023-09-08 NOTE — Telephone Encounter (Signed)
Patient came and sign patient assistance form for Creon faxed form to them and waiting on there response if she has been approved or not.

## 2023-09-08 NOTE — Telephone Encounter (Signed)
Patient came and signed the patient assistance application form and brought her W2 form. Made a copy of w2 form and faxed application the [facility] patient assistance. Waiting on there response for approval.

## 2023-09-10 ENCOUNTER — Ambulatory Visit (INDEPENDENT_AMBULATORY_CARE_PROVIDER_SITE_OTHER): Payer: PPO | Admitting: Internal Medicine

## 2023-09-10 VITALS — BP 118/72 | HR 75 | Temp 98.0°F | Resp 16 | Ht 64.0 in | Wt 151.0 lb

## 2023-09-10 DIAGNOSIS — F320 Major depressive disorder, single episode, mild: Secondary | ICD-10-CM

## 2023-09-10 DIAGNOSIS — K219 Gastro-esophageal reflux disease without esophagitis: Secondary | ICD-10-CM

## 2023-09-10 DIAGNOSIS — F439 Reaction to severe stress, unspecified: Secondary | ICD-10-CM | POA: Diagnosis not present

## 2023-09-10 DIAGNOSIS — K51419 Inflammatory polyps of colon with unspecified complications: Secondary | ICD-10-CM

## 2023-09-10 DIAGNOSIS — G479 Sleep disorder, unspecified: Secondary | ICD-10-CM

## 2023-09-10 DIAGNOSIS — J4520 Mild intermittent asthma, uncomplicated: Secondary | ICD-10-CM

## 2023-09-10 DIAGNOSIS — M549 Dorsalgia, unspecified: Secondary | ICD-10-CM

## 2023-09-10 DIAGNOSIS — R944 Abnormal results of kidney function studies: Secondary | ICD-10-CM | POA: Diagnosis not present

## 2023-09-10 DIAGNOSIS — E7800 Pure hypercholesterolemia, unspecified: Secondary | ICD-10-CM | POA: Diagnosis not present

## 2023-09-10 DIAGNOSIS — M7989 Other specified soft tissue disorders: Secondary | ICD-10-CM

## 2023-09-10 DIAGNOSIS — G4730 Sleep apnea, unspecified: Secondary | ICD-10-CM

## 2023-09-10 DIAGNOSIS — I729 Aneurysm of unspecified site: Secondary | ICD-10-CM

## 2023-09-10 DIAGNOSIS — Z794 Long term (current) use of insulin: Secondary | ICD-10-CM

## 2023-09-10 DIAGNOSIS — E237 Disorder of pituitary gland, unspecified: Secondary | ICD-10-CM | POA: Diagnosis not present

## 2023-09-10 DIAGNOSIS — E1165 Type 2 diabetes mellitus with hyperglycemia: Secondary | ICD-10-CM | POA: Diagnosis not present

## 2023-09-10 DIAGNOSIS — Z0000 Encounter for general adult medical examination without abnormal findings: Secondary | ICD-10-CM

## 2023-09-10 DIAGNOSIS — I10 Essential (primary) hypertension: Secondary | ICD-10-CM | POA: Diagnosis not present

## 2023-09-10 DIAGNOSIS — R011 Cardiac murmur, unspecified: Secondary | ICD-10-CM

## 2023-09-10 DIAGNOSIS — L4050 Arthropathic psoriasis, unspecified: Secondary | ICD-10-CM

## 2023-09-10 DIAGNOSIS — D329 Benign neoplasm of meninges, unspecified: Secondary | ICD-10-CM

## 2023-09-10 DIAGNOSIS — G4733 Obstructive sleep apnea (adult) (pediatric): Secondary | ICD-10-CM | POA: Diagnosis not present

## 2023-09-10 MED ORDER — AMLODIPINE BESYLATE 5 MG PO TABS: ORAL_TABLET | ORAL | 1 refills | Status: DC

## 2023-09-10 MED ORDER — LOSARTAN POTASSIUM 100 MG PO TABS: ORAL_TABLET | ORAL | 1 refills | Status: DC

## 2023-09-10 NOTE — Progress Notes (Unsigned)
Subjective:    Patient ID: Ashley Gross, female    DOB: 11/23/1950, 72 y.o.   MRN: [id number]  Patient here for  Chief Complaint  Patient presents with   Annual Exam   Discussed the use of AI scribe software for clinical note transcription with the patient, who gave verbal consent to proceed.  HPI Here for physical exam.  Had f/u with vascular 08/18/23 - recommended continuing compression hose and leg elevation. Also had f/u podiatry 07/28/23 - recommended pressure relief off of the forefoot areas bilateral.  Debrided out the ingrown borders of nails. Follow up regarding dizziness 07/19/23 - started zyrtec and trial of astelin nasal spray. Seeing hematology - IV iron.  Seeing Dr Allegra Lai on entyvio. Recently changed dosing interval to q 6 weeks. Still with persistent rectal bleeding. Reports that the medication takes about a week to take effect, reducing the bleeding to minor leakage. However, the effects seem to wear off a couple of weeks before the next dose, leading to a recurrence of significant bleeding. Following, may decrease interval to q 4 weeks.   The patient also reports experiencing back pain. First noticed after cleaning a deep fryer and has persisted since. The pain is located in the mid-back region and is exacerbated by standing or being on her feet for extended periods. She has been managing the pain with regular Tylenol, which provides some relief but does not completely alleviate the discomfort.  Has sleep apnea.  Report having tried multiple masks without finding a comfortable and effective fit. The patient has been taping her mouth shut to manage this issue but finds this solution uncomfortable and inconvenient. Planning to try a chin strap.      Past Medical History:  Diagnosis Date   Abnormal liver function test 10/06/2012   Anemia 01/25/2022   Anemia, unspecified    Asthma    B12 deficiency 02/11/2019   Bleeding internal hemorrhoids 09/13/2017   Choledocholithiasis  06/29/2012   Formatting of this note might be different from the original. Dilated CBD on abdominal imaging 04/2012   Chronic diarrhea 02/16/2013   Colitis    Complication of anesthesia    asthma attack after shoulder surgery   Diabetes mellitus (HCC)    type II   Esophagitis    GERD (gastroesophageal reflux disease)    Heart murmur    for years- nothing to be concerned about.   History of kidney stones    Hx of arteriovenous malformation (AVM)    Hypertension    IBS (irritable bowel syndrome)    Loss of weight    Peripheral vascular disease (HCC)    Pernicious anemia    Personal history of colonic polyps 02/10/2013   02/08/13 colonoscopy - question of rectal varices, two 3-42mm polyps in the sigmoid colon, mass at the hepatic flexure, one 5mm polyp at the hepatic flexure, nodule at the ileocecal valve, congested mucusa in the entire colon, flattened villi mucosa in the terminal ileum.     Pneumonia    Psoriatic arthritis (HCC)    s/p penicillamine, plaquenil, MTX, sulfasalazine.  s/p Embrel, Humira,.  Iritis.     Pure hypercholesterolemia    Rectal bleeding 07/15/2017   Stroke (HCC) 05/29/2022   per patient   Past Surgical History:  Procedure Laterality Date   ABDOMINAL HYSTERECTOMY  1981   ovaries left in place   ANKLE SURGERY     right   BUNIONECTOMY     CHOLECYSTECTOMY  1989   COLONOSCOPY WITH  PROPOFOL N/A 12/27/2017   Procedure: COLONOSCOPY WITH PROPOFOL;  Surgeon: Toney Reil, MD;  Location: [facility] ENDOSCOPY;  Service: Gastroenterology;  Laterality: N/A;   COLONOSCOPY WITH PROPOFOL N/A 01/04/2019   Procedure: COLONOSCOPY WITH PROPOFOL;  Surgeon: Toney Reil, MD;  Location: [facility]GERY [facility];  Service: Endoscopy;  Laterality: N/A;  Diabetic - oral and injectable   COLONOSCOPY WITH PROPOFOL N/A 07/24/2021   Procedure: COLONOSCOPY WITH PROPOFOL;  Surgeon: Toney Reil, MD;  Location: [facility] SURGERY [facility];  Service: Endoscopy;  Laterality: N/A;  Diabetic    ESOPHAGOGASTRODUODENOSCOPY (EGD) WITH PROPOFOL N/A 12/27/2017   Procedure: ESOPHAGOGASTRODUODENOSCOPY (EGD) WITH PROPOFOL;  Surgeon: Toney Reil, MD;  Location: [facility] ENDOSCOPY;  Service: Gastroenterology;  Laterality: N/A;   HAND SURGERY     right   HEEL SPUR SURGERY     IR 3D INDEPENDENT WKST  10/01/2021   IR ANGIO INTRA EXTRACRAN SEL COM CAROTID INNOMINATE UNI L MOD SED  10/01/2021   IR ANGIO INTRA EXTRACRAN SEL INTERNAL CAROTID UNI R MOD SED  10/01/2021   IR ANGIO INTRA EXTRACRAN SEL INTERNAL CAROTID UNI R MOD SED  05/29/2022   IR ANGIO VERTEBRAL SEL VERTEBRAL BILAT MOD SED  10/01/2021   IR ANGIO VERTEBRAL SEL VERTEBRAL UNI R MOD SED  05/29/2022   IR CT HEAD LTD  10/01/2021   IR TRANSCATH/EMBOLIZ  10/01/2021   IR US GUIDE VASC ACCESS RIGHT  10/01/2021   IR US GUIDE VASC ACCESS RIGHT  05/29/2022   NOSE SURGERY     x2   POLYPECTOMY  01/04/2019   Procedure: POLYPECTOMY;  Surgeon: Toney Reil, MD;  Location: [facility] SURGERY [facility];  Service: Endoscopy;;   POLYPECTOMY N/A 07/24/2021   Procedure: POLYPECTOMY;  Surgeon: Toney Reil, MD;  Location: [facility];  Service: Endoscopy;  Laterality: N/A;   RADIOLOGY WITH ANESTHESIA N/A 10/01/2021   Procedure: Alfredia Client WITH ANESTHESIA;  Surgeon: Baldemar Lenis, MD;  Location: [facility] OR;  Service: Radiology;  Laterality: N/A;   SHOULDER ARTHROSCOPY WITH OPEN ROTATOR CUFF REPAIR Left 09/20/2018   Procedure: SHOULDER ARTHROSCOPY WITH OPEN ROTATOR CUFF REPAIR;  Surgeon: Christena Flake, MD;  Location: [facility] ORS;  Service: Orthopedics;  Laterality: Left;   SHOULDER CLOSED REDUCTION Left 12/21/2018   Procedure: MANIPULATION UNDER ANESTHESIA WITH STEROID INJECTION;  Surgeon: Christena Flake, MD;  Location: [facility] SURGERY [facility];  Service: Orthopedics;  Laterality: Left;  Diabetic - insulin and oral meds   UMBILICAL HERNIA REPAIR     XI ROBOTIC ASSISTED INGUINAL HERNIA REPAIR WITH MESH Left 12/16/2021   Procedure: XI ROBOTIC  ASSISTED INGUINAL HERNIA REPAIR WITH MESH;  Surgeon: [name], MD;  Location: [facility] ORS;  Service: General;  Laterality: Left;   Family History  Problem Relation Age of Onset   Diabetes Other    Hypertension Other    Breast cancer Neg Hx    Colon cancer Neg Hx    Social History   Socioeconomic History   Marital status: Widowed    Spouse name: Not on file   Number of children: 2   Years of education: Not on file   Highest education level: Associate degree: occupational, Scientist, product/process development, or vocational program  Occupational History   Not on file  Tobacco Use   Smoking status: Never    Passive exposure: Never   Smokeless tobacco: Never  Vaping Use   Vaping status: Never Used  Substance and Sexual Activity   Alcohol use: Never   Drug use: Never   Sexual  activity: Not Currently  Other Topics Concern   Not on file  Social History Narrative   She is married and has two children   Grand daughter lives with her.   Social Determinants of Health   Financial Resource Strain: Low Risk  (03/11/2023)   Overall Financial Resource Strain (CARDIA)    Difficulty of Paying Living Expenses: Not very hard  Food Insecurity: No Food Insecurity (03/11/2023)   Hunger Vital Sign    Worried About Running Out of Food in the Last Year: Never true    Ran Out of Food in the Last Year: Never true  Transportation Needs: No Transportation Needs (03/11/2023)   PRAPARE - Administrator, Civil Service (Medical): No    Lack of Transportation (Non-Medical): No  Physical Activity: Unknown (03/11/2023)   Exercise Vital Sign    Days of Exercise per Week: 0 days    Minutes of Exercise per Session: Not on file  Stress: Stress Concern Present (03/11/2023)   [employer] of Occupational Health - Occupational Stress Questionnaire    Feeling of Stress : Very much  Social Connections: Moderately Integrated (03/11/2023)   Social Connection and Isolation Panel [NHANES]    Frequency of Communication with  Friends and Family: More than three times a week    Frequency of Social Gatherings with Friends and Family: More than three times a week    Attends Religious Services: More than 4 times per year    Active Member of [employer] or Organizations: Yes    Attends Banker Meetings: More than 4 times per year    Marital Status: Widowed     Review of Systems  Constitutional:  Negative for appetite change and fever.  HENT:  Negative for congestion and sinus pressure.   Respiratory:  Negative for cough, chest tightness and shortness of breath.   Cardiovascular:  Negative for chest pain and palpitations.  Gastrointestinal:  Negative for abdominal pain, nausea and vomiting.       Rectal bleeding as outlined.   Genitourinary:  Negative for difficulty urinating and dysuria.  Musculoskeletal:  Negative for joint swelling and myalgias.  Skin:  Negative for color change and rash.  Neurological:  Negative for dizziness and headaches.  Psychiatric/Behavioral:  Negative for agitation and dysphoric mood.        Objective:     BP 118/72   Pulse 75   Temp 98 F (36.7 C)   Resp 16   Ht 5\' 4"  (1.626 m)   Wt 151 lb (68.5 kg)   SpO2 99%   BMI 25.92 kg/m  Wt Readings from Last 3 Encounters:  09/10/23 151 lb (68.5 kg)  08/18/23 151 lb 11.2 oz (68.8 kg)  07/19/23 150 lb (68 kg)    Physical Exam Vitals reviewed.  Constitutional:      General: She is not in acute distress.    Appearance: Normal appearance. She is well-developed.  HENT:     Head: Normocephalic and atraumatic.     Right Ear: External ear normal.     Left Ear: External ear normal.  Eyes:     General: No scleral icterus.       Right eye: No discharge.        Left eye: No discharge.     Conjunctiva/sclera: Conjunctivae normal.  Neck:     Thyroid: No thyromegaly.  Cardiovascular:     Rate and Rhythm: Normal rate and regular rhythm.  Pulmonary:     Effort: No tachypnea,  accessory muscle usage or respiratory distress.      Breath sounds: Normal breath sounds. No decreased breath sounds or wheezing.  Chest:  Breasts:    Right: No inverted nipple, mass, nipple discharge or tenderness (no axillary adenopathy).     Left: No inverted nipple, mass, nipple discharge or tenderness (no axilarry adenopathy).  Abdominal:     General: Bowel sounds are normal.     Palpations: Abdomen is soft.     Tenderness: There is no abdominal tenderness.  Musculoskeletal:        General: No swelling or tenderness.     Cervical back: Neck supple.  Lymphadenopathy:     Cervical: No cervical adenopathy.  Skin:    Findings: No erythema or rash.  Neurological:     Mental Status: She is alert and oriented to person, place, and time.  Psychiatric:        Mood and Affect: Mood normal.        Behavior: Behavior normal.      Outpatient Encounter Medications as of 09/10/2023  Medication Sig   acetaminophen (TYLENOL) 500 MG tablet Take 2 tablets (1,000 mg total) by mouth every 6 (six) hours as needed for mild pain.   albuterol (VENTOLIN HFA) 108 (90 Base) MCG/ACT inhaler Inhale 2 puffs into the lungs every 6 (six) hours as needed for wheezing or shortness of breath.   amLODipine (NORVASC) 5 MG tablet TAKE 1 TABLET(5 MG) BY MOUTH TWICE DAILY   azelastine (ASTELIN) 0.1 % nasal spray Place 2 sprays into both nostrils 2 (two) times daily. Use in each nostril as directed   cetirizine (ZYRTEC) 10 MG tablet Take 10 mg by mouth daily.   Cholecalciferol (VITAMIN D) 50 MCG (2000 UT) tablet Take 2,000 Units by mouth daily.   CREON 36000-114000 units CPEP capsule Take 1-2 capsules (36,000-72,000 Units total) by mouth See admin instructions. Take 720000 units with each meal and 40981 units with snacks   gabapentin (NEURONTIN) 600 MG tablet Take 600 mg by mouth daily.   insulin degludec (TRESIBA FLEXTOUCH) 100 UNIT/ML FlexTouch Pen Inject 16 Units into the skin daily.   loperamide (IMODIUM) 2 MG capsule Take 4 mg by mouth daily.   losartan  (COZAAR) 100 MG tablet TAKE 1 TABLET(100 MG) BY MOUTH AT BEDTIME   mometasone (ELOCON) 0.1 % ointment Apply 1 Application topically 2 (two) times daily.   omeprazole (PRILOSEC) 20 MG capsule TAKE 1 CAPSULE(20 MG) BY MOUTH TWICE DAILY   ONE TOUCH ULTRA TEST test strip PATIENT NEEDS NEW METER STRIPS AND LANCETS FOR ONE TOUCH. HER INSURANCE NO LONGER COVERS ACCUCHEK.   rosuvastatin (CRESTOR) 10 MG tablet TAKE 1 TABLET(10 MG) BY MOUTH DAILY   vedolizumab (ENTYVIO) 300 MG injection Inject 300 mg into the vein every 6 (six) weeks.   WIXELA INHUB 100-50 MCG/ACT AEPB INHALE 1 PUFF INTO THE LUNGS TWICE A DAY   [DISCONTINUED] amLODipine (NORVASC) 5 MG tablet TAKE 1 TABLET(5 MG) BY MOUTH TWICE DAILY   [DISCONTINUED] losartan (COZAAR) 100 MG tablet TAKE 1 TABLET(100 MG) BY MOUTH AT BEDTIME   [DISCONTINUED] Probiotic Product (PROBIOTIC DAILY PO) Take 1 capsule by mouth daily.   No facility-administered encounter medications on file as of 09/10/2023.     Lab Results  Component Value Date   WBC 5.6 07/19/2023   HGB 12.3 07/19/2023   HCT 38.1 07/19/2023   PLT 237.0 07/19/2023   GLUCOSE 133 (H) 09/10/2023   CHOL 124 09/08/2023   TRIG 112.0 09/08/2023   HDL 47.70  09/08/2023   LDLDIRECT 74.0 04/19/2019   LDLCALC 54 09/08/2023   ALT 14 09/08/2023   AST 18 09/08/2023   NA 139 09/10/2023   K 4.3 09/10/2023   CL 104 09/10/2023   CREATININE 1.07 (H) 09/10/2023   BUN 20 09/10/2023   CO2 24 09/10/2023   TSH 3.28 04/30/2023   INR 1.1 02/19/2023   HGBA1C 6.8 (H) 09/08/2023   MICROALBUR 6.6 (H) 04/30/2023    VAS [place] LOWER EXTREMITY VENOUS REFLUX  Result Date: 08/19/2023  Lower Venous Reflux Study Patient Name:  ZAINAB GOECKNER  Date of Exam:   08/18/2023 Medical Rec #: [id number]         Accession #:    [id number] Date of Birth: 1951-10-04         Patient Gender: F Patient Age:   72 years Exam Location:  [facility] Procedure:      VAS [place] LOWER EXTREMITY VENOUS REFLUX Referring Phys:  Lemar Livings --------------------------------------------------------------------------------  Indications: Swelling.  Performing Technologist: Thereasa Parkin RVT  Examination Guidelines: A complete evaluation includes B-mode imaging, spectral Doppler, color Doppler, and power Doppler as needed of all accessible portions of each vessel. Bilateral testing is considered an integral part of a complete examination. Limited examinations for reoccurring indications may be performed as noted. The reflux portion of the exam is performed with the patient in reverse Trendelenburg. Significant venous reflux is defined as >500 ms in the superficial venous system, and >1 second in the deep venous system.  +--------------+---------+------+-----------+------------+-------------+ LEFT          Reflux NoRefluxReflux TimeDiameter cmsComments                              Yes                                       +--------------+---------+------+-----------+------------+-------------+ CFV                     yes   >1 second                           +--------------+---------+------+-----------+------------+-------------+ FV prox       no                                                  +--------------+---------+------+-----------+------------+-------------+ FV mid        no                                                  +--------------+---------+------+-----------+------------+-------------+ FV dist       no                                                  +--------------+---------+------+-----------+------------+-------------+ Popliteal     no                                                  +--------------+---------+------+-----------+------------+-------------+  GSV at [facility]              yes    >500 ms     0.766                  +--------------+---------+------+-----------+------------+-------------+ GSV prox thighno                           0.415                   +--------------+---------+------+-----------+------------+-------------+ GSV mid thigh no                            0.31    out of fascia +--------------+---------+------+-----------+------------+-------------+ GSV dist thighno                           0.338                  +--------------+---------+------+-----------+------------+-------------+ GSV at knee   no                            0.26                  +--------------+---------+------+-----------+------------+-------------+ GSV prox calf no                           0.224                  +--------------+---------+------+-----------+------------+-------------+ SSV Pop Fossa no                           0.333                  +--------------+---------+------+-----------+------------+-------------+ SSV prox calf no                           0.178                  +--------------+---------+------+-----------+------------+-------------+ SSV mid calf  no                           0.201                  +--------------+---------+------+-----------+------------+-------------+   Summary: Left: - No evidence of deep vein thrombosis seen in the left lower extremity, from the common femoral through the popliteal veins. - No evidence of superficial venous thrombosis in the left lower extremity.  - Deep vein reflux in the CFV. - Superficial vein reflux in the SFJ.  *See table(s) above for measurements and observations. Electronically signed by Lemar Livings MD on 08/19/2023 at 11:14:45 AM.    Final        Assessment & Plan:  Decreased GFR -     BASIC METABOLIC PANEL WITH eGFR  Type 2 diabetes mellitus with hyperglycemia, with long-term current use of insulin [facility]) Assessment & Plan: Evaristo Bury.  Discussed low sugars. Discussed eating regular meals and not going long periods without eating.  Follow met b and A1c.   Lab Results  Component Value Date   HGBA1C 6.8 (H) 09/08/2023      Swelling of lower  extremity Assessment & Plan: Saw vascular 05/2023 - recommended compression  hose. F/u 3 months.  F/u vascular surgery ([id number])- compression hose.    Stress Assessment & Plan: Increased stress with her underlying medical issues. Has good support.  Follow.    Sleep difficulties Assessment & Plan: Having trouble with masks - cpap.  Has tried multiple masks. Planning to try chin strap.    Sleep apnea, unspecified type Assessment & Plan: Cpap issues as outlined.  Planning to try chin strap.    Psoriatic arthritis (HCC) Assessment & Plan: Stable.  Has been followed by rheumatology.     Pituitary lesion [facility]) Assessment & Plan: Pituitary microadenoma -She was evaluated by neurology ([facility]) Recommended - MRI 6 months.  [facility] - 01/2023 - plan MRI/MRA.  Per 05/2023 - phone note - stable.  Recommended f/u in one year.    Meningioma [facility]) Assessment & Plan: Followed by [facility] as outlined.    Pseudopolyposis of colon with complication, unspecified part of colon [facility]) Assessment & Plan: S/p colonoscopy 07/24/21 as outlined.  Continues f/u with GI.  On entyvio. Keep f/u with GI. Adjusting dosing frequency.    Primary hypertension Assessment & Plan: On amlodipine and losartan.  Follow pressures.  Follow metabolic panel.    Hypercholesterolemia Assessment & Plan: Continue crestor. Follow lipid panel and liver function tests.     Gastroesophageal reflux disease, unspecified whether esophagitis present Assessment & Plan: Continue omeprazole. No increased acid reflux.     Depression, major, single episode, mild (HCC) Assessment & Plan: Discussed.  Overall appears to be handling things relatively well.  Will notify if feels needs further intervention.  Follow.    Mild intermittent asthma without complication Assessment & Plan: Breathing stable.  No increased cough and congestion.  Continue symbicort.  Has duonebs if needed.    Aneurysm [facility]) Assessment & Plan: Saw Dr Myer Haff  [facility] - 01/2023 - At this point, she is asymptomatic. He recommended follow-up imaging. His office will arrange for MRI scan of the brain as well as MR angiogram of the brain in the summer. F/u telephone call - stable.  Recommended f/u in one year.    Murmur Assessment & Plan: Appear to be more prominent.  Last 2023.  Discussed f/u echo.    Orders: -     ECHOCARDIOGRAM COMPLETE; Future  Acute midline back pain, unspecified back location Assessment & Plan: Back pain as outlined. Noticed after cleaning air fryer.  Taking tylenol. Avoid heavy lifting.  Notify if persistent.    Other orders -     amLODIPine Besylate; TAKE 1 TABLET(5 MG) BY MOUTH TWICE DAILY  Dispense: 180 tablet; Refill: 1 -     Losartan Potassium; TAKE 1 TABLET(100 MG) BY MOUTH AT BEDTIME  Dispense: 90 tablet; Refill: 1     Dale Rosedale, MD

## 2023-09-10 NOTE — Telephone Encounter (Signed)
Left message on VM for patient to call clinic to make follow up OV for CPAP compliance check, virtual okay per [facility].

## 2023-09-11 LAB — BASIC METABOLIC PANEL WITH GFR
BUN/Creatinine Ratio: 19 (calc) (ref 6–22)
BUN: 20 mg/dL (ref 7–25)
CO2: 24 mmol/L (ref 20–32)
Calcium: 8.1 mg/dL — ABNORMAL LOW (ref 8.6–10.4)
Chloride: 104 mmol/L (ref 98–110)
Creat: 1.07 mg/dL — ABNORMAL HIGH (ref 0.60–1.00)
Glucose, Bld: 133 mg/dL — ABNORMAL HIGH (ref 65–99)
Potassium: 4.3 mmol/L (ref 3.5–5.3)
Sodium: 139 mmol/L (ref 135–146)
eGFR: 55 mL/min/{1.73_m2} — ABNORMAL LOW (ref >=60)

## 2023-09-12 ENCOUNTER — Encounter: Payer: Self-pay | Admitting: Internal Medicine

## 2023-09-12 DIAGNOSIS — M549 Dorsalgia, unspecified: Secondary | ICD-10-CM | POA: Insufficient documentation

## 2023-09-12 DIAGNOSIS — R011 Cardiac murmur, unspecified: Secondary | ICD-10-CM | POA: Insufficient documentation

## 2023-09-12 NOTE — Assessment & Plan Note (Signed)
Appear to be more prominent.  Last 2023.  Discussed f/u echo.

## 2023-09-12 NOTE — Assessment & Plan Note (Signed)
Continue crestor.  Follow lipid panel and liver function tests.  

## 2023-09-12 NOTE — Assessment & Plan Note (Signed)
Increased stress with her underlying medical issues. Has good support.  Follow.  

## 2023-09-12 NOTE — Assessment & Plan Note (Signed)
Stable.  Has been followed by rheumatology.   

## 2023-09-12 NOTE — Assessment & Plan Note (Signed)
On amlodipine and losartan.  Follow pressures.  Follow metabolic panel.  

## 2023-09-12 NOTE — Assessment & Plan Note (Signed)
Continue omeprazole. No increased acid reflux.   

## 2023-09-12 NOTE — Assessment & Plan Note (Signed)
Discussed.  Overall appears to be handling things relatively well.  Will notify if feels needs further intervention.  Follow.  

## 2023-09-12 NOTE — Assessment & Plan Note (Signed)
Having trouble with masks - cpap.  Has tried multiple masks. Planning to try chin strap.

## 2023-09-12 NOTE — Assessment & Plan Note (Signed)
Cpap issues as outlined.  Planning to try chin strap.

## 2023-09-12 NOTE — Assessment & Plan Note (Signed)
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  [facility] - 01/2023 - plan MRI/MRA.  Per 05/2023 - phone note - stable.  Recommended f/u in one year.

## 2023-09-12 NOTE — Assessment & Plan Note (Signed)
Breathing stable.  No increased cough and congestion.  Continue symbicort.  Has duonebs if needed.

## 2023-09-12 NOTE — Assessment & Plan Note (Signed)
Ashley Gross.  Discussed low sugars. Discussed eating regular meals and not going long periods without eating.  Follow met b and A1c.   Lab Results  Component Value Date   HGBA1C 6.8 (H) 09/08/2023

## 2023-09-12 NOTE — Assessment & Plan Note (Signed)
Back pain as outlined. Noticed after cleaning air fryer.  Taking tylenol. Avoid heavy lifting.  Notify if persistent.

## 2023-09-12 NOTE — Assessment & Plan Note (Signed)
S/p colonoscopy 07/24/21 as outlined.  Continues f/u with GI.  On entyvio. Keep f/u with GI. Adjusting dosing frequency.

## 2023-09-12 NOTE — Assessment & Plan Note (Signed)
Saw vascular 05/2023 - recommended compression hose. F/u 3 months.  F/u vascular surgery ([id number])- compression hose.

## 2023-09-12 NOTE — Assessment & Plan Note (Signed)
Followed by [facility] as outlined.

## 2023-09-12 NOTE — Assessment & Plan Note (Signed)
Saw Dr Myer Haff [facility] - 01/2023 - At this point, she is asymptomatic. He recommended follow-up imaging. His office will arrange for MRI scan of the brain as well as MR angiogram of the brain in the summer. F/u telephone call - stable.  Recommended f/u in one year.

## 2023-09-13 ENCOUNTER — Telehealth: Payer: Self-pay

## 2023-09-13 ENCOUNTER — Telehealth: Payer: Self-pay | Admitting: Internal Medicine

## 2023-09-13 NOTE — Telephone Encounter (Signed)
Patient has been approved through 11/01/2024 per fax. Called and informed patient and left a detail message

## 2023-09-13 NOTE — Telephone Encounter (Signed)
-----   Message from [place] sent at 09/13/2023  4:21 AM EST ----- Please call and notify - kidney function improved from recent check.  Will continue to follow. Please confirm her back is doing better.

## 2023-09-13 NOTE — Telephone Encounter (Signed)
Lvm for pt to give office a call in regards to labs, see message below

## 2023-09-13 NOTE — Telephone Encounter (Signed)
Lft pt vm to call ofc to sch echo . thanks

## 2023-09-14 ENCOUNTER — Telehealth: Payer: Self-pay

## 2023-09-14 NOTE — Telephone Encounter (Signed)
-----   Message from [place] sent at 09/13/2023  4:21 AM EST ----- Please call and notify - kidney function improved from recent check.  Will continue to follow. Please confirm her back is doing better.

## 2023-09-15 NOTE — Telephone Encounter (Signed)
Called patient.  Patient has an OV on 10/08/2023 at 9:30 am with Rhunette Croft, NP in [place] for CPAP compliance check that was scheduled by [employer].  Patient is aware and verbalized understanding.  Nothing further needed at this time.

## 2023-09-15 NOTE — Telephone Encounter (Signed)
Received a fax that they were missing information from patient. We have requesting information directly from the patient. Once they receive this information they will continue Evaluation  the creon. I printed and fax the patient tax forms she gave for the Entyvio.

## 2023-09-16 DIAGNOSIS — H04123 Dry eye syndrome of bilateral lacrimal glands: Secondary | ICD-10-CM | POA: Diagnosis not present

## 2023-09-16 DIAGNOSIS — G4733 Obstructive sleep apnea (adult) (pediatric): Secondary | ICD-10-CM | POA: Diagnosis not present

## 2023-09-16 DIAGNOSIS — Z961 Presence of intraocular lens: Secondary | ICD-10-CM | POA: Diagnosis not present

## 2023-09-17 ENCOUNTER — Inpatient Hospital Stay: Payer: PPO | Attending: Oncology

## 2023-09-17 DIAGNOSIS — D509 Iron deficiency anemia, unspecified: Secondary | ICD-10-CM | POA: Insufficient documentation

## 2023-09-17 LAB — IRON AND TIBC
Iron: 89 ug/dL (ref 28–170)
Saturation Ratios: 30 % (ref 10.4–31.8)
TIBC: 295 ug/dL (ref 250–450)
UIBC: 206 ug/dL

## 2023-09-17 LAB — CBC
HCT: 37.9 % (ref 36.0–46.0)
Hemoglobin: 12.1 g/dL (ref 12.0–15.0)
MCH: 29.4 pg (ref 26.0–34.0)
MCHC: 31.9 g/dL (ref 30.0–36.0)
MCV: 92.2 fL (ref 80.0–100.0)
Platelets: 199 10*3/uL (ref 150–400)
RBC: 4.11 MIL/uL (ref 3.87–5.11)
RDW: 14.6 % (ref 11.5–15.5)
WBC: 6.5 10*3/uL (ref 4.0–10.5)
nRBC: 0 % (ref 0.0–0.2)

## 2023-09-17 LAB — FERRITIN: Ferritin: 67 ng/mL (ref 11–307)

## 2023-09-20 ENCOUNTER — Other Ambulatory Visit: Payer: PPO

## 2023-09-20 NOTE — Telephone Encounter (Signed)
Patient states she will call her insurance company and find out this information and give them a call when she gets home from work today

## 2023-09-20 NOTE — Telephone Encounter (Signed)
Called [employer] patient assistance and they said they are needing patient to call her insurance company and ask them if the creon is cover on her pharmacy benefits, what is her deductible, does the creon need a prior authorization, what is the copayment, and what is the out of pocket max. They said after this information is found out to give them a call back and they will document the information in the application. Called patient and left a message for call back.

## 2023-09-21 NOTE — Telephone Encounter (Signed)
Patient has been approved for [employer] patient assistance till 11/01/2024.

## 2023-09-24 ENCOUNTER — Ambulatory Visit
Admission: RE | Admit: 2023-09-24 | Discharge: 2023-09-24 | Disposition: A | Discharge status: Home / Self Care | Payer: PPO | Source: Ambulatory Visit | Attending: Gastroenterology | Admitting: Gastroenterology

## 2023-09-24 DIAGNOSIS — K51411 Inflammatory polyps of colon with rectal bleeding: Secondary | ICD-10-CM | POA: Insufficient documentation

## 2023-09-24 MED ORDER — VEDOLIZUMAB 300 MG IV SOLR
300.0000 mg | Freq: Once | INTRAVENOUS | Status: AC
  Administered 2023-09-24: 300 mg via INTRAVENOUS
  Filled 2023-09-24: qty 5

## 2023-10-04 ENCOUNTER — Ambulatory Visit: Payer: PPO | Admitting: Gastroenterology

## 2023-10-05 ENCOUNTER — Telehealth: Payer: Self-pay

## 2023-10-05 NOTE — Telephone Encounter (Signed)
Received a updated order request for same same day for patient entyvio. They are needing patient diagnosis and provider signature and date. Patient no showed appointment on 10/04/2023 called and left a message for patient to see if we could reschedule her appointment.

## 2023-10-08 ENCOUNTER — Telehealth: Payer: Self-pay | Admitting: Internal Medicine

## 2023-10-08 ENCOUNTER — Telehealth: Payer: Self-pay

## 2023-10-08 ENCOUNTER — Encounter: Payer: Self-pay | Admitting: Nurse Practitioner

## 2023-10-08 ENCOUNTER — Ambulatory Visit: Payer: PPO | Attending: Internal Medicine

## 2023-10-08 ENCOUNTER — Ambulatory Visit: Payer: PPO | Admitting: Nurse Practitioner

## 2023-10-08 VITALS — BP 110/62 | HR 69 | Temp 97.3°F | Ht 64.0 in | Wt 154.4 lb

## 2023-10-08 DIAGNOSIS — R011 Cardiac murmur, unspecified: Secondary | ICD-10-CM

## 2023-10-08 DIAGNOSIS — G4733 Obstructive sleep apnea (adult) (pediatric): Secondary | ICD-10-CM | POA: Diagnosis not present

## 2023-10-08 DIAGNOSIS — F5101 Primary insomnia: Secondary | ICD-10-CM

## 2023-10-08 LAB — ECHOCARDIOGRAM COMPLETE
AR max vel: 1.95 cm2
AV Area VTI: 1.96 cm2
AV Area mean vel: 1.97 cm2
AV Mean grad: 5 mm[Hg]
AV Peak grad: 9.7 mm[Hg]
Ao pk vel: 1.56 m/s
Area-P 1/2: 3.53 cm2
Calc EF: 56.9 %
Height: 64 in
S' Lateral: 2.7 cm
Single Plane A2C EF: 52.9 %
Single Plane A4C EF: 59.9 %
Weight: 2470.4 [oz_av]

## 2023-10-08 MED ORDER — ESZOPICLONE 1 MG PO TABS
1.0000 mg | ORAL_TABLET | Freq: Every evening | ORAL | 0 refills | Status: DC | PRN
Start: 2023-10-08 — End: 2023-10-22

## 2023-10-08 NOTE — Telephone Encounter (Signed)
-----   Message from [place] sent at 10/08/2023 12:56 PM EST ----- Notify - ECHO - normal heart function. No significant valve abnormality.  Also see phone note on pt.

## 2023-10-08 NOTE — Assessment & Plan Note (Addendum)
Doxepin and ramelteon not covered. Currently taking 800 mg of gabapentin nightly. Will trial her on low dose Lunesta and adjust dose as needed. Side effect profile reviewed. Medication education discussed. Understands to not drive after taking. Aware to monitor for changes in mood/sleep habits. Understands to stop gabapentin and not take with Lunesta. Will reassess at follow up. Sleep hygiene reviewed.

## 2023-10-08 NOTE — Telephone Encounter (Signed)
LMTCB

## 2023-10-08 NOTE — Assessment & Plan Note (Signed)
Severe OSA on CPAP. Excellent compliance and control. Has difficulties with air leaks and mask fit per her report. Will try switching her to a set pressure of 9 cmH2O and set her up for a mask desensitization study at [facility]. Encouraged her to continue using nightly. Reviewed risks of untreated OSA. Understands proper use/care of device. Safe driving practices reviewed.  Patient Instructions  Continue to use CPAP every night, minimum of 4-6 hours a night.  Change equipment as directed. Wash your tubing with warm soap and water daily, hang to dry. Wash humidifier portion weekly. Use bottled, distilled water and change daily Be aware of reduced alertness and do not drive or operate heavy machinery if experiencing this or drowsiness.  Exercise encouraged, as tolerated. Healthy weight management discussed.  Avoid or decrease alcohol consumption and medications that make you more sleepy, if possible. Notify if persistent daytime sleepiness occurs even with consistent use of PAP therapy.  Change CPAP pressure to set pressure of 9 cmH2O Keep using the nasal mask with chinstrap for now We will send you for a mask fitting at [facility] if you keep having difficulties   Stop gabapentin. Start lunesta 1 mg At bedtime as needed for sleep. Take immediately before bed with plan to be in the bed for 7-8 hours. Apply your CPAP within 5-10 minutes of taking to avoid falling asleep without it as it could cause worsening sleep apnea. Monitor and notify if you develop any changes in sleep habits, such as sleep walking, or mood changes. Do not drive or operate heavy machinery after taking. Do not take additional sedative medications or alcohol.  I typically tell people to try the 1 mg for a few nights and then increase to 2 mg if you're still having difficulties. Let me know if you're still having trouble with 2 mg and we can possibly go up to 3 mg   Follow up in 6-8 weeks with Katie Chaela Branscum,NP in [place] or video visit.  If symptoms do not improve or worsen, please contact office for sooner follow up or seek emergency care.

## 2023-10-08 NOTE — Progress Notes (Signed)
@Patient  ID: Ashley Gross, female    DOB: 1951/04/13, 72 y.o.   MRN: [id number]  Chief Complaint  Patient presents with   Follow-up    CPAP- can not find a mask that fits good.    Referring provider: Dale Stewart, MD  HPI: 72 year old female, never smoker followed for OSA on CPAP and insomnia.  She was last seen by Clent Ridges NP 05/03/2023.  Past medical history significant for hypertension, brain aneurysm, mild intermittent asthma, GERD, pituitary lesion, type 2 diabetes, psoriatic arthritis, vitamin D deficiency.  TEST/EVENTS:  04/20/2023 HST: AHI 33.8/h, SpO2 low 76%  05/03/2023: OV with Clent Ridges NP.  Reviewed sleep study results which revealed severe OSA with AHI 33.8.  Shared decision to move forward with CPAP due to severity.  Currently taking Neurontin, Tylenol PM and melatonin without much improvement in insomnia.  Difficulty falling and staying asleep.  Unable to sleep on side due to shoulder problems.  Advised to start ramelteon 8 mg at bedtime.  10/08/2023: Today-follow-up Patient presents today for overdue follow-up.  Since her last visit, she was started on CPAP.  She does use it nightly but she has been having some difficulties with mask and pressure settings.  She gets a lot of air leaking.  At one point, she was on a fullface mask and the air leak actually caused issues with dry eyes and some vision changes.  She switched back to a nasal mask with a chinstrap but still has problems with the air leaking.  She has also tried a DreamWear fullface mask but she had to pull it so tight to get to fit around her mouth that it was very uncomfortable.  Otherwise, she does feel like the CPAP helps her some.  She is just frustrated with the mask at this point.  Denies any issues with drowsy driving or morning headaches. She has longstanding issue with insomnia.  CPAP has not seem to change this.  Still has trouble falling and staying asleep.  She was unable to afford the ramelteon as it was not  covered by insurance.  At some point, she was prescribed doxepin as well but this was also not covered.  She is back taking Neurontin nightly, 800 mg.  This is the only thing that seems to be able to keep her sleep.  She never tried any alternative sleep medicines.  She denies any sleep parasomnia/paralysis.  She does not have any issues with restless legs or neuropathy for which the gabapentin is used.  09/06/2023-10/05/2023 30/30 days; 97% >4 hr; average use 5 hr 33 min Pressure 95th 9.4 Leaks 95th 13.6, max 54 AHI 1.4  Allergies  Allergen Reactions   Aspirin Other (See Comments)    Increased bleeding   Beta Adrenergic Blockers     3rd degree blockage    Lisinopril Cough   Mtx Support [Cobalamine Combinations] Other (See Comments)    Respiratory symptoms   Nsaids    Vasotec [Enalapril] Cough   Verapamil Other (See Comments)    Heart block   Voltaren [Diclofenac Sodium]     Pt unsure of reaction    Erythromycin Other (See Comments)    Gi intolerance   Indocin [Indomethacin]     Upset stomach   Jardiance [Empagliflozin] Other (See Comments)    Recurrent yeast infections, even with washout and rechallenge    Immunization History  Administered Date(s) Administered   Fluad Quad(high Dose 65+) 07/20/2019, 07/23/2020, 07/24/2021, 08/13/2022   Fluad Trivalent(High Dose 65+) 07/22/2023  Influenza Whole 07/03/2012   Influenza, High Dose Seasonal PF 07/12/2017, 08/01/2018   Influenza, Seasonal, Injecte, Preservative Fre 09/04/2013   Influenza-Unspecified 07/17/2014, 07/31/2015, 07/15/2016   PFIZER Comirnaty(Gray Top)Covid-19 Tri-Sucrose Vaccine 03/25/2021, 08/20/2023   PFIZER(Purple Top)SARS-COV-2 Vaccination 12/13/2019, 01/03/2020, 08/15/2020   Pneumococcal Conjugate-13 05/17/2017   Pneumococcal Polysaccharide-23 11/13/2020    Past Medical History:  Diagnosis Date   Abnormal liver function test 10/06/2012   Anemia 01/25/2022   Anemia, unspecified    Asthma    B12 deficiency  02/11/2019   Bleeding internal hemorrhoids 09/13/2017   Choledocholithiasis 06/29/2012   Formatting of this note might be different from the original. Dilated CBD on abdominal imaging 04/2012   Chronic diarrhea 02/16/2013   Colitis    Complication of anesthesia    asthma attack after shoulder surgery   Diabetes mellitus (HCC)    type II   Esophagitis    GERD (gastroesophageal reflux disease)    Heart murmur    for years- nothing to be concerned about.   History of kidney stones    Hx of arteriovenous malformation (AVM)    Hypertension    IBS (irritable bowel syndrome)    Loss of weight    Peripheral vascular disease (HCC)    Pernicious anemia    Personal history of colonic polyps 02/10/2013   02/08/13 colonoscopy - question of rectal varices, two 3-53mm polyps in the sigmoid colon, mass at the hepatic flexure, one 5mm polyp at the hepatic flexure, nodule at the ileocecal valve, congested mucusa in the entire colon, flattened villi mucosa in the terminal ileum.     Pneumonia    Psoriatic arthritis (HCC)    s/p penicillamine, plaquenil, MTX, sulfasalazine.  s/p Embrel, Humira,.  Iritis.     Pure hypercholesterolemia    Rectal bleeding 07/15/2017   Stroke (HCC) 05/29/2022   per patient    Tobacco History: Social History   Tobacco Use  Smoking Status Never   Passive exposure: Never  Smokeless Tobacco Never   Counseling given: Not Answered   Outpatient Medications Prior to Visit  Medication Sig Dispense Refill   acetaminophen (TYLENOL) 500 MG tablet Take 2 tablets (1,000 mg total) by mouth every 6 (six) hours as needed for mild pain.     albuterol (VENTOLIN HFA) 108 (90 Base) MCG/ACT inhaler Inhale 2 puffs into the lungs every 6 (six) hours as needed for wheezing or shortness of breath.     amLODipine (NORVASC) 5 MG tablet TAKE 1 TABLET(5 MG) BY MOUTH TWICE DAILY 180 tablet 1   azelastine (ASTELIN) 0.1 % nasal spray Place 2 sprays into both nostrils 2 (two) times daily. Use in  each nostril as directed 30 mL 0   cetirizine (ZYRTEC) 10 MG tablet Take 10 mg by mouth daily.     Cholecalciferol (VITAMIN D) 50 MCG (2000 UT) tablet Take 2,000 Units by mouth daily.     CREON 36000-114000 units CPEP capsule Take 1-2 capsules (36,000-72,000 Units total) by mouth See admin instructions. Take 720000 units with each meal and 82956 units with snacks 240 capsule 3   gabapentin (NEURONTIN) 100 MG capsule Take 200 mg by mouth at bedtime.     gabapentin (NEURONTIN) 600 MG tablet Take 600 mg by mouth daily.     insulin degludec (TRESIBA FLEXTOUCH) 100 UNIT/ML FlexTouch Pen Inject 16 Units into the skin daily. (Patient taking differently: Inject 14 Units into the skin daily.) 15 mL 1   loperamide (IMODIUM) 2 MG capsule Take 4 mg by mouth daily.  losartan (COZAAR) 100 MG tablet TAKE 1 TABLET(100 MG) BY MOUTH AT BEDTIME 90 tablet 1   mometasone (ELOCON) 0.1 % ointment Apply 1 Application topically 2 (two) times daily.     omeprazole (PRILOSEC) 20 MG capsule TAKE 1 CAPSULE(20 MG) BY MOUTH TWICE DAILY 180 capsule 3   ONE TOUCH ULTRA TEST test strip PATIENT NEEDS NEW METER STRIPS AND LANCETS FOR ONE TOUCH. HER INSURANCE NO LONGER COVERS ACCUCHEK. 100 each PRN   rosuvastatin (CRESTOR) 10 MG tablet TAKE 1 TABLET(10 MG) BY MOUTH DAILY 90 tablet 3   vedolizumab (ENTYVIO) 300 MG injection Inject 300 mg into the vein every 6 (six) weeks.     WIXELA INHUB 100-50 MCG/ACT AEPB INHALE 1 PUFF INTO THE LUNGS TWICE A DAY 60 each 3   No facility-administered medications prior to visit.     Review of Systems:   Constitutional: No weight loss or gain, night sweats, fevers, chills, or lassitude. +fatigue  HEENT: No headaches, difficulty swallowing, tooth/dental problems, or sore throat. No sneezing, itching, ear ache, nasal congestion, or post nasal drip CV:  No chest pain, orthopnea, PND, swelling in lower extremities, anasarca, dizziness, palpitations, syncope Resp: No shortness of breath with  exertion or at rest. No excess mucus or change in color of mucus. No productive or non-productive. No hemoptysis. No wheezing.  No chest wall deformity GI:  No heartburn, indigestion GU: No nocturia Skin: No rash, lesions, ulcerations Neuro: No dizziness or lightheadedness.  Psych: No depression or anxiety. Mood stable. +sleep disturbance     Physical Exam:  BP 110/62 (BP Location: Right Arm, Cuff Size: Normal)   Pulse 69   Temp (!) 97.3 F (36.3 C)   Ht 5\' 4"  (1.626 m)   Wt 154 lb 6.4 oz (70 kg)   SpO2 97%   BMI 26.50 kg/m   GEN: Pleasant, interactive, well-appearing; in no acute distress HEENT:  Normocephalic and atraumatic. PERRLA. Sclera white. Nasal turbinates pink, moist and patent bilaterally. No rhinorrhea present. Oropharynx pink and moist, without exudate or edema. No lesions, ulcerations, or postnasal drip. Mallampati II NECK:  Supple w/ fair ROM. No JVD present. Normal carotid impulses w/o bruits. Thyroid symmetrical with no goiter or nodules palpated. No lymphadenopathy.   CV: RRR, no m/r/g, no peripheral edema. Pulses intact, +2 bilaterally. No cyanosis, pallor or clubbing. PULMONARY:  Unlabored, regular breathing. Clear bilaterally A&P w/o wheezes/rales/rhonchi. No accessory muscle use.  GI: BS present and normoactive. Soft, non-tender to palpation. No organomegaly or masses detected.  MSK: No erythema, warmth or tenderness. Cap refil <2 sec all extrem. No deformities or joint swelling noted.  Neuro: A/Ox3. No focal deficits noted.   Skin: Warm, no lesions or rashe Psych: Normal affect and behavior. Judgement and thought content appropriate.     Lab Results:  CBC    Component Value Date/Time   WBC 6.5 09/17/2023 1258   RBC 4.11 09/17/2023 1258   HGB 12.1 09/17/2023 1258   HGB 10.3 (L) 06/02/2023 1502   HCT 37.9 09/17/2023 1258   HCT 32.4 (L) 06/02/2023 1502   PLT 199 09/17/2023 1258   PLT 242 06/02/2023 1502   MCV 92.2 09/17/2023 1258   MCV 88 06/02/2023  1502   MCH 29.4 09/17/2023 1258   MCHC 31.9 09/17/2023 1258   RDW 14.6 09/17/2023 1258   RDW 13.4 06/02/2023 1502   LYMPHSABS 1.8 03/02/2023 1154   MONOABS 0.7 03/02/2023 1154   EOSABS 0.2 03/02/2023 1154   BASOSABS 0.0 03/02/2023 1154  BMET    Component Value Date/Time   NA 139 09/10/2023 1350   K 4.3 09/10/2023 1350   K 4.1 06/12/2013 1657   CL 104 09/10/2023 1350   CO2 24 09/10/2023 1350   GLUCOSE 133 (H) 09/10/2023 1350   BUN 20 09/10/2023 1350   BUN 15 07/17/2021 1635   CREATININE 1.07 (H) 09/10/2023 1350   CALCIUM 8.1 (L) 09/10/2023 1350   GFRNONAA >60 02/19/2023 0519   GFRAA >60 11/28/2019 0953    BNP    Component Value Date/Time   BNP 192.1 (H) 07/02/2022 1144     Imaging:  No results found.  Administration History     None           No data to display          No results found for: "NITRICOXIDE"      Assessment & Plan:   Severe obstructive sleep apnea Severe OSA on CPAP. Excellent compliance and control. Has difficulties with air leaks and mask fit per her report. Will try switching her to a set pressure of 9 cmH2O and set her up for a mask desensitization study at [facility]. Encouraged her to continue using nightly. Reviewed risks of untreated OSA. Understands proper use/care of device. Safe driving practices reviewed.  Patient Instructions  Continue to use CPAP every night, minimum of 4-6 hours a night.  Change equipment as directed. Wash your tubing with warm soap and water daily, hang to dry. Wash humidifier portion weekly. Use bottled, distilled water and change daily Be aware of reduced alertness and do not drive or operate heavy machinery if experiencing this or drowsiness.  Exercise encouraged, as tolerated. Healthy weight management discussed.  Avoid or decrease alcohol consumption and medications that make you more sleepy, if possible. Notify if persistent daytime sleepiness occurs even with consistent use of PAP therapy.  Change  CPAP pressure to set pressure of 9 cmH2O Keep using the nasal mask with chinstrap for now We will send you for a mask fitting at [facility] if you keep having difficulties   Stop gabapentin. Start lunesta 1 mg At bedtime as needed for sleep. Take immediately before bed with plan to be in the bed for 7-8 hours. Apply your CPAP within 5-10 minutes of taking to avoid falling asleep without it as it could cause worsening sleep apnea. Monitor and notify if you develop any changes in sleep habits, such as sleep walking, or mood changes. Do not drive or operate heavy machinery after taking. Do not take additional sedative medications or alcohol.  I typically tell people to try the 1 mg for a few nights and then increase to 2 mg if you're still having difficulties. Let me know if you're still having trouble with 2 mg and we can possibly go up to 3 mg   Follow up in 6-8 weeks with Katie Brenlynn Fake,NP in [place] or video visit. If symptoms do not improve or worsen, please contact office for sooner follow up or seek emergency care.    Insomnia Doxepin and ramelteon not covered. Currently taking 800 mg of gabapentin nightly. Will trial her on low dose Lunesta and adjust dose as needed. Side effect profile reviewed. Medication education discussed. Understands to not drive after taking. Aware to monitor for changes in mood/sleep habits. Understands to stop gabapentin and not take with Lunesta. Will reassess at follow up. Sleep hygiene reviewed.    Advised if symptoms do not improve or worsen, to please contact office for sooner follow  up or seek emergency care.   I spent 35 minutes of dedicated to the care of this patient on the date of this encounter to include pre-visit review of records, face-to-face time with the patient discussing conditions above, post visit ordering of testing, clinical documentation with the electronic health record, making appropriate referrals as documented, and communicating necessary  findings to members of the patients care team.  Noemi Chapel, NP 10/08/2023  Pt aware and understands NP's role.

## 2023-10-08 NOTE — Patient Instructions (Addendum)
Continue to use CPAP every night, minimum of 4-6 hours a night.  Change equipment as directed. Wash your tubing with warm soap and water daily, hang to dry. Wash humidifier portion weekly. Use bottled, distilled water and change daily Be aware of reduced alertness and do not drive or operate heavy machinery if experiencing this or drowsiness.  Exercise encouraged, as tolerated. Healthy weight management discussed.  Avoid or decrease alcohol consumption and medications that make you more sleepy, if possible. Notify if persistent daytime sleepiness occurs even with consistent use of PAP therapy.  Change CPAP pressure to set pressure of 9 cmH2O Keep using the nasal mask with chinstrap for now We will send you for a mask fitting at [facility] if you keep having difficulties   Stop gabapentin. Start lunesta 1 mg At bedtime as needed for sleep. Take immediately before bed with plan to be in the bed for 7-8 hours. Apply your CPAP within 5-10 minutes of taking to avoid falling asleep without it as it could cause worsening sleep apnea. Monitor and notify if you develop any changes in sleep habits, such as sleep walking, or mood changes. Do not drive or operate heavy machinery after taking. Do not take additional sedative medications or alcohol.  I typically tell people to try the 1 mg for a few nights and then increase to 2 mg if you're still having difficulties. Let me know if you're still having trouble with 2 mg and we can possibly go up to 3 mg   Follow up in 6-8 weeks with Katie Yulisa Chirico,NP in [place] or video visit. If symptoms do not improve or worsen, please contact office for sooner follow up or seek emergency care.

## 2023-10-08 NOTE — Telephone Encounter (Signed)
Hi Dr Lorin Picket. I just finished an echo on your pt w/o any outstanding issues. It'll be interpreted today. When I brought her back she shared that she had chest heaviness last night and into this morning. She eventually fell asleep but was uncomfortable for 2-3 hrs. Upon awaking this morning she was not having any symptoms and has not since the early morning hrs.  I copied this message that was sent to me.  Please call her to get more information and see how she is doing now.

## 2023-10-12 ENCOUNTER — Telehealth: Payer: Self-pay | Admitting: Physician Assistant

## 2023-10-12 ENCOUNTER — Ambulatory Visit (HOSPITAL_BASED_OUTPATIENT_CLINIC_OR_DEPARTMENT_OTHER): Payer: PPO | Attending: Nurse Practitioner | Admitting: Radiology

## 2023-10-12 DIAGNOSIS — G4733 Obstructive sleep apnea (adult) (pediatric): Secondary | ICD-10-CM

## 2023-10-12 NOTE — Telephone Encounter (Signed)
Pt requesting call back returning call

## 2023-10-12 NOTE — Telephone Encounter (Signed)
Spoke to pt, pt requested a call back from [facility]. Call back # [phone]

## 2023-10-12 NOTE — Telephone Encounter (Signed)
See phone note

## 2023-10-12 NOTE — Telephone Encounter (Signed)
Patient states she has been sick for the last 2 weeks that why she has not return my call. Informed her I was just calling because she had missed her appointment and I was calling to see if she wanted to reschedule it. She said yes she is so sorry she missed it. Reschedule it for 12/21/2022 with Dr. Allegra Lai.

## 2023-10-16 DIAGNOSIS — G4733 Obstructive sleep apnea (adult) (pediatric): Secondary | ICD-10-CM | POA: Diagnosis not present

## 2023-10-19 ENCOUNTER — Telehealth: Payer: Self-pay | Admitting: Nurse Practitioner

## 2023-10-19 DIAGNOSIS — F5101 Primary insomnia: Secondary | ICD-10-CM

## 2023-10-19 NOTE — Telephone Encounter (Signed)
Patient states taking quantity 3 Lunesta at night. States only got 30 day supply,. Will be running out in 3 days. Pharmacy is [facility] [place]. Patient phone number is [phone].

## 2023-10-21 NOTE — Telephone Encounter (Signed)
Patient checking on message for Lunesta. Patient out of medication. Patient phone number is [phone].

## 2023-10-21 NOTE — Telephone Encounter (Signed)
I called the pt to verify what she is taking  There was no answer- LMTCB

## 2023-10-22 ENCOUNTER — Encounter: Payer: Self-pay | Admitting: *Deleted

## 2023-10-22 DIAGNOSIS — Q6689 Other  specified congenital deformities of feet: Secondary | ICD-10-CM | POA: Diagnosis not present

## 2023-10-22 DIAGNOSIS — L909 Atrophic disorder of skin, unspecified: Secondary | ICD-10-CM | POA: Diagnosis not present

## 2023-10-22 DIAGNOSIS — B351 Tinea unguium: Secondary | ICD-10-CM | POA: Diagnosis not present

## 2023-10-22 DIAGNOSIS — M778 Other enthesopathies, not elsewhere classified: Secondary | ICD-10-CM | POA: Diagnosis not present

## 2023-10-22 DIAGNOSIS — L600 Ingrowing nail: Secondary | ICD-10-CM | POA: Diagnosis not present

## 2023-10-22 MED ORDER — ESZOPICLONE 3 MG PO TABS: 3.0000 mg | ORAL_TABLET | Freq: Every day | ORAL | 2 refills | Status: DC

## 2023-10-22 NOTE — Telephone Encounter (Signed)
Patient is calling back. She states she has not received a call. Her number is as listed [phone] . She needs a refill of the Eszopiclone generic for Lunesta. She is completely out.  [facility] [place]

## 2023-10-22 NOTE — Telephone Encounter (Signed)
done

## 2023-10-22 NOTE — Telephone Encounter (Signed)
I refilled Lunesta 3 mg tablets. Please send her a MyChart message to let her know that I updated the prescription to 3 mg tablets so she should only take one tablet. This is specified on the rx as well. She needs to keep her follow up with me 2/7. Thanks!

## 2023-10-22 NOTE — Telephone Encounter (Signed)
Called the pt and there was no answer- LMTCB    

## 2023-10-22 NOTE — Telephone Encounter (Signed)
I called her again and this time someone answered but did not speak  I waited about 30 seconds and kept saying hello and nothing from the pt   Ashley Gross- she told the front staff that she is using 3 tablets of lunesta 1 mg and is now out   I have tried calling her 3 x now without success

## 2023-11-05 ENCOUNTER — Ambulatory Visit
Admission: RE | Admit: 2023-11-05 | Discharge: 2023-11-05 | Disposition: A | Discharge status: Home / Self Care | Payer: PPO | Source: Ambulatory Visit | Attending: Gastroenterology | Admitting: Gastroenterology

## 2023-11-05 DIAGNOSIS — K51411 Inflammatory polyps of colon with rectal bleeding: Secondary | ICD-10-CM | POA: Diagnosis not present

## 2023-11-05 MED ORDER — VEDOLIZUMAB 300 MG IV SOLR
300.0000 mg | Freq: Once | INTRAVENOUS | Status: AC
  Administered 2023-11-05: 300 mg via INTRAVENOUS
  Filled 2023-11-05: qty 5

## 2023-11-16 DIAGNOSIS — G4733 Obstructive sleep apnea (adult) (pediatric): Secondary | ICD-10-CM | POA: Diagnosis not present

## 2023-11-19 ENCOUNTER — Other Ambulatory Visit: Payer: Self-pay | Admitting: Family Medicine

## 2023-11-19 DIAGNOSIS — J309 Allergic rhinitis, unspecified: Secondary | ICD-10-CM

## 2023-12-03 ENCOUNTER — Ambulatory Visit (INDEPENDENT_AMBULATORY_CARE_PROVIDER_SITE_OTHER): Payer: PPO | Admitting: Nurse Practitioner

## 2023-12-03 ENCOUNTER — Ambulatory Visit (INDEPENDENT_AMBULATORY_CARE_PROVIDER_SITE_OTHER)
Admission: RE | Admit: 2023-12-03 | Discharge: 2023-12-03 | Disposition: A | Discharge status: Home / Self Care | Payer: PPO | Source: Ambulatory Visit | Attending: Nurse Practitioner

## 2023-12-03 VITALS — BP 136/72 | HR 85 | Temp 98.5°F | Ht 64.0 in | Wt 141.8 lb

## 2023-12-03 DIAGNOSIS — J029 Acute pharyngitis, unspecified: Secondary | ICD-10-CM | POA: Diagnosis not present

## 2023-12-03 DIAGNOSIS — R509 Fever, unspecified: Secondary | ICD-10-CM

## 2023-12-03 DIAGNOSIS — R051 Acute cough: Secondary | ICD-10-CM

## 2023-12-03 DIAGNOSIS — J4521 Mild intermittent asthma with (acute) exacerbation: Secondary | ICD-10-CM

## 2023-12-03 DIAGNOSIS — R059 Cough, unspecified: Secondary | ICD-10-CM | POA: Diagnosis not present

## 2023-12-03 DIAGNOSIS — J45909 Unspecified asthma, uncomplicated: Secondary | ICD-10-CM | POA: Diagnosis not present

## 2023-12-03 LAB — POC COVID19 BINAXNOW: SARS Coronavirus 2 Ag: NEGATIVE

## 2023-12-03 LAB — POCT RAPID STREP A (OFFICE): Rapid Strep A Screen: NEGATIVE

## 2023-12-03 LAB — POCT FLU A/B STATUS
Influenza A, POC: NEGATIVE
Influenza B, POC: NEGATIVE

## 2023-12-03 MED ORDER — AZITHROMYCIN 250 MG PO TABS
ORAL_TABLET | ORAL | 0 refills | Status: AC
Start: 2023-12-03 — End: 2023-12-08

## 2023-12-03 MED ORDER — PREDNISONE 20 MG PO TABS
40.0000 mg | ORAL_TABLET | Freq: Every day | ORAL | 0 refills | Status: AC
Start: 2023-12-03 — End: 2023-12-08

## 2023-12-03 MED ORDER — BENZONATATE 100 MG PO CAPS
100.0000 mg | ORAL_CAPSULE | Freq: Three times a day (TID) | ORAL | 0 refills | Status: DC | PRN
Start: 2023-12-03 — End: 2023-12-17

## 2023-12-03 NOTE — Assessment & Plan Note (Signed)
Flu and COVID test in office.  Patient continues over-the-counter Delsym will do Tessalon Perles 100 mg 3 times daily as needed.  Pending chest x-ray

## 2023-12-03 NOTE — Progress Notes (Signed)
Acute Office Visit  Subjective:     Patient ID: Ashley Gross, female    DOB: 10/14/1951, 73 y.o.   MRN: [id number]  Chief Complaint  Patient presents with   Cough    Pt complains of chest tightness, prod cough. Raspy/sctrachy throat  that started on Tuesday. Last two nights pt states she had low-grade fever. Has taken two breathing treatments at night last night.      Patient is in today for sick symptoms with a history of Aneurysm, HTN, Asthma, OSA, GERD, DM2, Psoriatic arthritis on an immunocompromising drug  Symptoms started on Tuesday Sick contacts: none that she knows of Covid vaccine: original and boosters Flu vaccine: 07/22/2023  States that she was coughing Tuesday and felt tight in her chest. States on Wednesday she started to loose her voice. She would cough and feel a rattle in her chest. The past 2 night she had a low grade fever. First night was 100.2 and last night it was 100.4  She has been using delysum and tessalon perales. States that it did help with the cough. She did have some tightness, SHOB, and wheezing when she laid flat  She went to eat today and got nauseous and took a zofran      Review of Systems  Constitutional:  Positive for chills, fever and malaise/fatigue.  HENT:  Positive for ear pain (baseline) and sore throat.   Respiratory:  Positive for cough, shortness of breath and wheezing. Negative for sputum production.   Gastrointestinal:  Positive for diarrhea and nausea. Negative for abdominal pain and vomiting.  Musculoskeletal:  Negative for joint pain and myalgias.  Neurological:  Positive for headaches.        Objective:    BP 136/72   Pulse 85   Temp 98.5 F (36.9 C) (Oral)   Ht 5\' 4"  (1.626 m)   Wt 141 lb 12.8 oz (64.3 kg)   SpO2 96%   BMI 24.34 kg/m  BP Readings from Last 3 Encounters:  12/03/23 136/72  10/08/23 110/62  09/10/23 118/72   Wt Readings from Last 3 Encounters:  12/03/23 141 lb 12.8 oz (64.3 kg)   10/12/23 154 lb (69.9 kg)  10/08/23 154 lb 6.4 oz (70 kg)   SpO2 Readings from Last 3 Encounters:  12/03/23 96%  10/08/23 97%  09/10/23 99%      Physical Exam Vitals and nursing note reviewed.  Constitutional:      Appearance: Normal appearance.  HENT:     Right Ear: Tympanic membrane, ear canal and external ear normal.     Left Ear: Tympanic membrane, ear canal and external ear normal.     Nose:     Right Sinus: No maxillary sinus tenderness or frontal sinus tenderness.     Left Sinus: No maxillary sinus tenderness or frontal sinus tenderness.     Mouth/Throat:     Mouth: Mucous membranes are moist.     Pharynx: Oropharynx is clear.  Cardiovascular:     Rate and Rhythm: Normal rate and regular rhythm.     Heart sounds: Normal heart sounds.  Pulmonary:     Effort: Pulmonary effort is normal.     Breath sounds: Normal breath sounds.  Lymphadenopathy:     Cervical: No cervical adenopathy.  Neurological:     Mental Status: She is alert.     Results for orders placed or performed in visit on 12/03/23  POC COVID-19  Result Value Ref Range   SARS Coronavirus 2  Ag Negative Negative  POCT Flu A & B Status  Result Value Ref Range   Influenza A, POC Negative Negative   Influenza B, POC Negative Negative  Rapid Strep A  Result Value Ref Range   Rapid Strep A Screen Negative Negative        Assessment & Plan:   Problem List Items Addressed This Visit       Respiratory   Asthma exacerbation   Given patient's history, all negative viral test and strep test in office.  We will treat for asthma exacerbation prednisone 40 mg daily for 5 days along the Z-Pak.  Prednisone precautions discussed      Relevant Medications   predniSONE (DELTASONE) 20 MG tablet   azithromycin (ZITHROMAX) 250 MG tablet     Other   Acute cough - Primary   Flu and COVID test in office.  Patient continues over-the-counter Delsym will do Tessalon Perles 100 mg 3 times daily as needed.  Pending  chest x-ray      Relevant Medications   benzonatate (TESSALON) 100 MG capsule   Other Relevant Orders   POC COVID-19 (Completed)   POCT Flu A & B Status (Completed)   DG Chest 2 View   Fever   Flu, COVID, strep test in office.  Patient can use over-the-counter analgesics as needed rest drink plenty of fluid      Relevant Orders   POC COVID-19 (Completed)   POCT Flu A & B Status (Completed)   DG Chest 2 View   Sore throat   Strep, COVID, flu test in office.      Relevant Orders   Rapid Strep A (Completed)    Meds ordered this encounter  Medications   predniSONE (DELTASONE) 20 MG tablet    Sig: Take 2 tablets (40 mg total) by mouth daily with breakfast for 5 days.    Dispense:  10 tablet    Refill:  0    Supervising Provider:   Roxy Manns A [[id number]]   azithromycin (ZITHROMAX) 250 MG tablet    Sig: Take 2 tablets on day 1, then 1 tablet daily on days 2 through 5    Dispense:  6 tablet    Refill:  0    Supervising Provider:   Roxy Manns A [[id number]]   benzonatate (TESSALON) 100 MG capsule    Sig: Take 1 capsule (100 mg total) by mouth 3 (three) times daily as needed.    Dispense:  21 capsule    Refill:  0    Supervising Provider:   Roxy Manns A [[id number]]    Return if symptoms worsen or fail to improve.  Audria Nine, NP

## 2023-12-03 NOTE — Assessment & Plan Note (Addendum)
Given patient's history, all negative viral test and strep test in office.  We will treat for asthma exacerbation prednisone 40 mg daily for 5 days along the Z-Pak.  Prednisone precautions discussed

## 2023-12-03 NOTE — Patient Instructions (Signed)
Nice to see you today I have sent in some antibiotics, steroids, and cough pills The steroids will cause your sugar to be high Follow up if you do not start improving

## 2023-12-03 NOTE — Assessment & Plan Note (Signed)
Flu, COVID, strep test in office.  Patient can use over-the-counter analgesics as needed rest drink plenty of fluid

## 2023-12-03 NOTE — Assessment & Plan Note (Signed)
Strep, COVID, flu test in office.

## 2023-12-06 DIAGNOSIS — L400 Psoriasis vulgaris: Secondary | ICD-10-CM | POA: Diagnosis not present

## 2023-12-08 ENCOUNTER — Encounter: Payer: Self-pay | Admitting: Nurse Practitioner

## 2023-12-08 ENCOUNTER — Telehealth: Payer: Self-pay | Admitting: Internal Medicine

## 2023-12-08 DIAGNOSIS — Z794 Long term (current) use of insulin: Secondary | ICD-10-CM

## 2023-12-08 DIAGNOSIS — E7800 Pure hypercholesterolemia, unspecified: Secondary | ICD-10-CM

## 2023-12-08 NOTE — Telephone Encounter (Signed)
 Patient need lab orders.

## 2023-12-10 ENCOUNTER — Ambulatory Visit: Payer: PPO | Admitting: Nurse Practitioner

## 2023-12-10 ENCOUNTER — Encounter: Payer: Self-pay | Admitting: Nurse Practitioner

## 2023-12-10 VITALS — BP 136/78 | HR 71 | Temp 97.6°F | Ht 64.0 in | Wt 139.2 lb

## 2023-12-10 DIAGNOSIS — G4733 Obstructive sleep apnea (adult) (pediatric): Secondary | ICD-10-CM

## 2023-12-10 DIAGNOSIS — F5101 Primary insomnia: Secondary | ICD-10-CM | POA: Diagnosis not present

## 2023-12-10 MED ORDER — ESZOPICLONE 3 MG PO TABS
3.0000 mg | ORAL_TABLET | Freq: Every day | ORAL | 5 refills | Status: DC
Start: 2023-12-20 — End: 2024-06-13

## 2023-12-10 NOTE — Patient Instructions (Signed)
 Continue to use CPAP every night, minimum of 4-6 hours a night.  Change equipment as directed. Wash your tubing with warm soap and water  daily, hang to dry. Wash humidifier portion weekly. Use bottled, distilled water  and change daily Be aware of reduced alertness and do not drive or operate heavy machinery if experiencing this or drowsiness.  Exercise encouraged, as tolerated. Healthy weight management discussed.  Avoid or decrease alcohol consumption and medications that make you more sleepy, if possible. Notify if persistent daytime sleepiness occurs even with consistent use of PAP therapy.   Continue lunesta  3 mg At bedtime as needed for sleep. Take immediately before bed with plan to be in the bed for 7-8 hours. Apply your CPAP within 5-10 minutes of taking to avoid falling asleep without it as it could cause worsening sleep apnea. Monitor and notify if you develop any changes in sleep habits, such as sleep walking, or mood changes. Do not drive or operate heavy machinery after taking. Do not take additional sedative medications or alcohol.   Follow up in 6 months with Ashley Heidi Lemay,NP in [place] or video visit. If symptoms do not improve or worsen, please contact office for sooner follow up or seek emergency care.

## 2023-12-10 NOTE — Telephone Encounter (Signed)
 LABS ORDERED.

## 2023-12-10 NOTE — Addendum Note (Signed)
 Addended by: Victorino Grates D on: 12/10/2023 09:21 AM   Modules accepted: Orders
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
## 2023-12-10 NOTE — Assessment & Plan Note (Signed)
 Persistent insomniac symptoms despite CPAP use. She was previously taking 800 mg of gabapentin  to help her sleep. We discontinued this at her last OV and started her on Lunesta . She has received benefit from use of this. No aberrant behavior. Tolerating well without any side effects. Aware of safety profile. Sleep hygiene reviewed. PDMP reviewed.

## 2023-12-10 NOTE — Assessment & Plan Note (Signed)
 Severe OSA on CPAP. She previously had good compliance and control. Due to special circumstances, has been without her CPAP for around a month. Energy levels do feel negatively impacted without CPAP. She moved into her new home last night and will locate her CPAP today. She is eager to resume therapy. She does receive benefit from use. She switched to a hybrid full face mask, which was working well for her. Aware of risks of untreated OSA. Understands proper care/use of device. Safe driving practices reviewed.

## 2023-12-15 ENCOUNTER — Other Ambulatory Visit (INDEPENDENT_AMBULATORY_CARE_PROVIDER_SITE_OTHER): Payer: PPO

## 2023-12-15 DIAGNOSIS — Z794 Long term (current) use of insulin: Secondary | ICD-10-CM | POA: Diagnosis not present

## 2023-12-15 DIAGNOSIS — E1165 Type 2 diabetes mellitus with hyperglycemia: Secondary | ICD-10-CM

## 2023-12-15 DIAGNOSIS — E7800 Pure hypercholesterolemia, unspecified: Secondary | ICD-10-CM

## 2023-12-15 LAB — HEMOGLOBIN A1C: Hgb A1c MFr Bld: 7.6 % — ABNORMAL HIGH (ref 4.6–6.5)

## 2023-12-15 LAB — HEPATIC FUNCTION PANEL
ALT: 11 U/L (ref 0–35)
AST: 17 U/L (ref 0–37)
Albumin: 3.8 g/dL (ref 3.5–5.2)
Alkaline Phosphatase: 127 U/L — ABNORMAL HIGH (ref 39–117)
Bilirubin, Direct: 0.1 mg/dL (ref 0.0–0.3)
Total Bilirubin: 0.6 mg/dL (ref 0.2–1.2)
Total Protein: 6.3 g/dL (ref 6.0–8.3)

## 2023-12-15 LAB — BASIC METABOLIC PANEL
BUN: 14 mg/dL (ref 6–23)
CO2: 28 meq/L (ref 19–32)
Calcium: 8.6 mg/dL (ref 8.4–10.5)
Chloride: 104 meq/L (ref 96–112)
Creatinine, Ser: 1.1 mg/dL (ref 0.40–1.20)
GFR: 50.07 mL/min — ABNORMAL LOW (ref >60.00)
Glucose, Bld: 238 mg/dL — ABNORMAL HIGH (ref 70–99)
Potassium: 3.4 meq/L — ABNORMAL LOW (ref 3.5–5.1)
Sodium: 140 meq/L (ref 135–145)

## 2023-12-15 LAB — LIPID PANEL
Cholesterol: 157 mg/dL (ref 0–200)
HDL: 56.2 mg/dL (ref >39.00)
LDL Cholesterol: 66 mg/dL (ref 0–99)
NonHDL: 100.32
Total CHOL/HDL Ratio: 3
Triglycerides: 173 mg/dL — ABNORMAL HIGH (ref 0.0–149.0)
VLDL: 34.6 mg/dL (ref 0.0–40.0)

## 2023-12-16 ENCOUNTER — Ambulatory Visit: Payer: PPO | Admitting: Internal Medicine

## 2023-12-17 ENCOUNTER — Ambulatory Visit
Admission: RE | Admit: 2023-12-17 | Discharge: 2023-12-17 | Disposition: A | Discharge status: Home / Self Care | Payer: PPO | Source: Ambulatory Visit | Attending: Gastroenterology | Admitting: Gastroenterology

## 2023-12-17 ENCOUNTER — Ambulatory Visit (INDEPENDENT_AMBULATORY_CARE_PROVIDER_SITE_OTHER): Payer: PPO | Admitting: Internal Medicine

## 2023-12-17 VITALS — BP 140/80 | HR 97 | Temp 98.0°F | Resp 16 | Ht 64.0 in | Wt 140.6 lb

## 2023-12-17 DIAGNOSIS — E7800 Pure hypercholesterolemia, unspecified: Secondary | ICD-10-CM | POA: Diagnosis not present

## 2023-12-17 DIAGNOSIS — E237 Disorder of pituitary gland, unspecified: Secondary | ICD-10-CM | POA: Diagnosis not present

## 2023-12-17 DIAGNOSIS — R748 Abnormal levels of other serum enzymes: Secondary | ICD-10-CM

## 2023-12-17 DIAGNOSIS — E1121 Type 2 diabetes mellitus with diabetic nephropathy: Secondary | ICD-10-CM | POA: Diagnosis not present

## 2023-12-17 DIAGNOSIS — G4733 Obstructive sleep apnea (adult) (pediatric): Secondary | ICD-10-CM | POA: Diagnosis not present

## 2023-12-17 DIAGNOSIS — K51419 Inflammatory polyps of colon with unspecified complications: Secondary | ICD-10-CM

## 2023-12-17 DIAGNOSIS — J4520 Mild intermittent asthma, uncomplicated: Secondary | ICD-10-CM | POA: Diagnosis not present

## 2023-12-17 DIAGNOSIS — L4050 Arthropathic psoriasis, unspecified: Secondary | ICD-10-CM | POA: Diagnosis not present

## 2023-12-17 DIAGNOSIS — I10 Essential (primary) hypertension: Secondary | ICD-10-CM | POA: Diagnosis not present

## 2023-12-17 DIAGNOSIS — E876 Hypokalemia: Secondary | ICD-10-CM

## 2023-12-17 DIAGNOSIS — K219 Gastro-esophageal reflux disease without esophagitis: Secondary | ICD-10-CM

## 2023-12-17 DIAGNOSIS — F320 Major depressive disorder, single episode, mild: Secondary | ICD-10-CM | POA: Diagnosis not present

## 2023-12-17 DIAGNOSIS — F439 Reaction to severe stress, unspecified: Secondary | ICD-10-CM

## 2023-12-17 DIAGNOSIS — K51411 Inflammatory polyps of colon with rectal bleeding: Secondary | ICD-10-CM | POA: Diagnosis not present

## 2023-12-17 DIAGNOSIS — D329 Benign neoplasm of meninges, unspecified: Secondary | ICD-10-CM

## 2023-12-17 DIAGNOSIS — D509 Iron deficiency anemia, unspecified: Secondary | ICD-10-CM

## 2023-12-17 DIAGNOSIS — I729 Aneurysm of unspecified site: Secondary | ICD-10-CM

## 2023-12-17 MED ORDER — VEDOLIZUMAB 300 MG IV SOLR
300.0000 mg | Freq: Once | INTRAVENOUS | Status: AC
  Administered 2023-12-17: 300 mg via INTRAVENOUS
  Filled 2023-12-17: qty 5

## 2023-12-17 NOTE — Progress Notes (Signed)
Subjective:    Patient ID: Ashley Gross, female    DOB: [birth year], 73 y.o.   MRN: [id number]  Patient here for  Chief Complaint  Patient presents with   Medical Management of Chronic Issues    HPI Here for a scheduled follow up - f/u regarding diabetes, hypercholesterolemia and hypertension. Increased stress with family issues. She had to move out of her house and did not have any medications for a couple of weeks. She is back on her medication now. She stayed with her daughter for a while and is now in her own condo. Still with a lot of stress. Discussed. Does not feel needs any further intervention at this time. Discussed labs. A1c elevated. She has not been in her regular routine and increased stress as outlined. Sugars elevated. Saw pulmonary. Breathing stable. Taking lunesta to help with sleep.    Past Medical History:  Diagnosis Date   Abnormal liver function test 10/06/2012   Anemia 01/25/2022   Anemia, unspecified    Asthma    B12 deficiency 02/11/2019   Bleeding internal hemorrhoids 09/13/2017   Choledocholithiasis 06/29/2012   Formatting of this note might be different from the original. Dilated CBD on abdominal imaging 04/2012   Chronic diarrhea 02/16/2013   Colitis    Complication of anesthesia    asthma attack after shoulder surgery   Diabetes mellitus (HCC)    type II   Esophagitis    GERD (gastroesophageal reflux disease)    Heart murmur    for years- nothing to be concerned about.   History of kidney stones    Hx of arteriovenous malformation (AVM)    Hypertension    IBS (irritable bowel syndrome)    Loss of weight    Peripheral vascular disease (HCC)    Pernicious anemia    Personal history of colonic polyps 02/10/2013   02/08/13 colonoscopy - question of rectal varices, two 3-49mm polyps in the sigmoid colon, mass at the hepatic flexure, one 5mm polyp at the hepatic flexure, nodule at the ileocecal valve, congested mucusa in the entire colon, flattened  villi mucosa in the terminal ileum.     Pneumonia    Psoriatic arthritis (HCC)    s/p penicillamine, plaquenil, MTX, sulfasalazine.  s/p Embrel, Humira,.  Iritis.     Pure hypercholesterolemia    Rectal bleeding 07/15/2017   Stroke (HCC) 05/29/2022   per patient   Past Surgical History:  Procedure Laterality Date   ABDOMINAL HYSTERECTOMY  1981   ovaries left in place   ANKLE SURGERY     right   BUNIONECTOMY     CHOLECYSTECTOMY  1989   COLONOSCOPY WITH PROPOFOL N/A 12/27/2017   Procedure: COLONOSCOPY WITH PROPOFOL;  Surgeon: Toney Reil, MD;  Location: [facility] ENDOSCOPY;  Service: Gastroenterology;  Laterality: N/A;   COLONOSCOPY WITH PROPOFOL N/A 01/04/2019   Procedure: COLONOSCOPY WITH PROPOFOL;  Surgeon: Toney Reil, MD;  Location: [facility] SURGERY [facility];  Service: Endoscopy;  Laterality: N/A;  Diabetic - oral and injectable   COLONOSCOPY WITH PROPOFOL N/A 07/24/2021   Procedure: COLONOSCOPY WITH PROPOFOL;  Surgeon: Toney Reil, MD;  Location: [facility] SUR[facility]ERY [facility];  Service: Endoscopy;  Laterality: N/A;  Diabetic   ESOPHAGOGASTRODUODENOSCOPY (EGD) WITH PROPOFOL N/A 12/27/2017   Procedure: ESOPHAGOGASTRODUODENOSCOPY (EGD) WITH PROPOFOL;  Surgeon: Toney Reil, MD;  Location: [facility] ENDOSCOPY;  Service: Gastroenterology;  Laterality: N/A;   HAND SURGERY     right   HEEL SPUR SURGERY     IR  3D INDEPENDENT WKST  10/01/2021   IR ANGIO INTRA EXTRACRAN SEL COM CAROTID INNOMINATE UNI L MOD SED  10/01/2021   IR ANGIO INTRA EXTRACRAN SEL INTERNAL CAROTID UNI R MOD SED  10/01/2021   IR ANGIO INTRA EXTRACRAN SEL INTERNAL CAROTID UNI R MOD SED  05/29/2022   IR ANGIO VERTEBRAL SEL VERTEBRAL BILAT MOD SED  10/01/2021   IR ANGIO VERTEBRAL SEL VERTEBRAL UNI R MOD SED  05/29/2022   IR CT HEAD LTD  10/01/2021   IR TRANSCATH/EMBOLIZ  10/01/2021   IR US GUIDE VASC ACCESS RIGHT  10/01/2021   IR US GUIDE VASC ACCESS RIGHT  05/29/2022   NOSE SURGERY     x2   POLYPECTOMY  01/04/2019    Procedure: POLYPECTOMY;  Surgeon: Toney Reil, MD;  Location: [facility] SURGERY CNTR;  Service: Endoscopy;;   POLYPECTOMY N/A 07/24/2021   Procedure: POLYPECTOMY;  Surgeon: Toney Reil, MD;  Location: [facility] SURGERY CNTR;  Service: Endoscopy;  Laterality: N/A;   RADIOLOGY WITH ANESTHESIA N/A 10/01/2021   Procedure: Alfredia Client WITH ANESTHESIA;  Surgeon: Baldemar Lenis, MD;  Location: [facility] OR;  Service: Radiology;  Laterality: N/A;   SHOULDER ARTHROSCOPY WITH OPEN ROTATOR CUFF REPAIR Left 09/20/2018   Procedure: SHOULDER ARTHROSCOPY WITH OPEN ROTATOR CUFF REPAIR;  Surgeon: Christena Flake, MD;  Location: [facility] ORS;  Service: Orthopedics;  Laterality: Left;   SHOULDER CLOSED REDUCTION Left 12/21/2018   Procedure: MANIPULATION UNDER ANESTHESIA WITH STEROID INJECTION;  Surgeon: Christena Flake, MD;  Location: [facility] SURGERY [facility];  Service: Orthopedics;  Laterality: Left;  Diabetic - insulin and oral meds   UMBILICAL HERNIA REPAIR     XI ROBOTIC ASSISTED INGUINAL HERNIA REPAIR WITH MESH Left 12/16/2021   Procedure: XI ROBOTIC ASSISTED INGUINAL HERNIA REPAIR WITH MESH;  Surgeon: [name], MD;  Location: [facility] ORS;  Service: General;  Laterality: Left;   Family History  Problem Relation Age of Onset   Diabetes Other    Hypertension Other    Breast cancer Neg Hx    Colon cancer Neg Hx    Social History   Socioeconomic History   Marital status: Widowed    Spouse name: Not on file   Number of children: 2   Years of education: Not on file   Highest education level: Associate degree: occupational, Scientist, product/process development, or vocational program  Occupational History   Not on file  Tobacco Use   Smoking status: Never    Passive exposure: Never   Smokeless tobacco: Never  Vaping Use   Vaping status: Never Used  Substance and Sexual Activity   Alcohol use: Never   Drug use: Never   Sexual activity: Not Currently  Other Topics Concern   Not on file  Social History Narrative   She  is married and has two children   Grand daughter lives with her.   Social Drivers of Corporate investment banker Strain: Low Risk  (03/11/2023)   Overall Financial Resource Strain (CARDIA)    Difficulty of Paying Living Expenses: Not very hard  Food Insecurity: No Food Insecurity (03/11/2023)   Hunger Vital Sign    Worried About Running Out of Food in the Last Year: Never true    Ran Out of Food in the Last Year: Never true  Transportation Needs: No Transportation Needs (03/11/2023)   PRAPARE - Administrator, Civil Service (Medical): No    Lack of Transportation (Non-Medical): No  Physical Activity: Unknown (03/11/2023)   Exercise Vital Sign  Days of Exercise per Week: 0 days    Minutes of Exercise per Session: Not on file  Stress: Stress Concern Present (03/11/2023)   [employer] of Occupational Health - Occupational Stress Questionnaire    Feeling of Stress : Very much  Social Connections: Moderately Integrated (03/11/2023)   Social Connection and Isolation Panel [NHANES]    Frequency of Communication with Friends and Family: More than three times a week    Frequency of Social Gatherings with Friends and Family: More than three times a week    Attends Religious Services: More than 4 times per year    Active Member of [employer] or Organizations: Yes    Attends Banker Meetings: More than 4 times per year    Marital Status: Widowed     Review of Systems  Constitutional:  Negative for appetite change and unexpected weight change.  HENT:  Negative for congestion and sinus pressure.   Respiratory:  Negative for cough, chest tightness and shortness of breath.   Cardiovascular:  Negative for chest pain and palpitations.  Gastrointestinal:  Negative for abdominal pain, diarrhea, nausea and vomiting.  Genitourinary:  Negative for difficulty urinating and dysuria.  Musculoskeletal:  Negative for joint swelling and myalgias.  Skin:  Negative for color change and  rash.  Neurological:  Negative for dizziness and headaches.  Psychiatric/Behavioral:  Negative for agitation and dysphoric mood.        Objective:     BP (!) 140/80   Pulse 97   Temp 98 F (36.7 C)   Resp 16   Ht 5\' 4"  (1.626 m)   Wt 140 lb 9.6 oz (63.8 kg)   SpO2 99%   BMI 24.13 kg/m  Wt Readings from Last 3 Encounters:  12/17/23 140 lb 9.6 oz (63.8 kg)  12/10/23 139 lb 3.2 oz (63.1 kg)  12/03/23 141 lb 12.8 oz (64.3 kg)    Physical Exam Vitals reviewed.  Constitutional:      General: She is not in acute distress.    Appearance: Normal appearance.  HENT:     Head: Normocephalic and atraumatic.     Right Ear: External ear normal.     Left Ear: External ear normal.     Mouth/Throat:     Pharynx: No oropharyngeal exudate or posterior oropharyngeal erythema.  Eyes:     General: No scleral icterus.       Right eye: No discharge.        Left eye: No discharge.     Conjunctiva/sclera: Conjunctivae normal.  Neck:     Thyroid: No thyromegaly.  Cardiovascular:     Rate and Rhythm: Normal rate and regular rhythm.  Pulmonary:     Effort: No respiratory distress.     Breath sounds: Normal breath sounds. No wheezing.  Abdominal:     General: Bowel sounds are normal.     Palpations: Abdomen is soft.     Tenderness: There is no abdominal tenderness.  Musculoskeletal:        General: No swelling or tenderness.     Cervical back: Neck supple. No tenderness.  Lymphadenopathy:     Cervical: No cervical adenopathy.  Skin:    Findings: No erythema or rash.  Neurological:     Mental Status: She is alert.  Psychiatric:        Mood and Affect: Mood normal.        Behavior: Behavior normal.         Outpatient Encounter Medications as of  12/17/2023  Medication Sig   acetaminophen (TYLENOL) 500 MG tablet Take 2 tablets (1,000 mg total) by mouth every 6 (six) hours as needed for mild pain.   albuterol (VENTOLIN HFA) 108 (90 Base) MCG/ACT inhaler Inhale 2 puffs into the  lungs every 6 (six) hours as needed for wheezing or shortness of breath.   amLODipine (NORVASC) 5 MG tablet TAKE 1 TABLET(5 MG) BY MOUTH TWICE DAILY   Azelastine HCl 137 MCG/SPRAY SOLN PLACE 2 SPRAYS INTO BOTH NOSTRILS 2 (TWO) TIMES DAILY. USE IN EACH NOSTRIL AS DIRECTED   cetirizine (ZYRTEC) 10 MG tablet Take 10 mg by mouth daily.   Cholecalciferol (VITAMIN D) 50 MCG (2000 UT) tablet Take 2,000 Units by mouth daily.   CREON 36000-114000 units CPEP capsule Take 1-2 capsules (36,000-72,000 Units total) by mouth See admin instructions. Take 720000 units with each meal and 78469 units with snacks   [START ON 12/20/2023] Eszopiclone 3 MG TABS Take 1 tablet (3 mg total) by mouth at bedtime. Take immediately before bedtime   insulin degludec (TRESIBA FLEXTOUCH) 100 UNIT/ML FlexTouch Pen Inject 16 Units into the skin daily. (Patient taking differently: Inject 14 Units into the skin daily.)   loperamide (IMODIUM) 2 MG capsule Take 4 mg by mouth daily.   losartan (COZAAR) 100 MG tablet TAKE 1 TABLET(100 MG) BY MOUTH AT BEDTIME   mometasone (ELOCON) 0.1 % ointment Apply 1 Application topically 2 (two) times daily.   omeprazole (PRILOSEC) 20 MG capsule TAKE 1 CAPSULE(20 MG) BY MOUTH TWICE DAILY   ONE TOUCH ULTRA TEST test strip PATIENT NEEDS NEW METER STRIPS AND LANCETS FOR ONE TOUCH. HER INSURANCE NO LONGER COVERS ACCUCHEK.   rosuvastatin (CRESTOR) 10 MG tablet TAKE 1 TABLET(10 MG) BY MOUTH DAILY   vedolizumab (ENTYVIO) 300 MG injection Inject 300 mg into the vein every 6 (six) weeks.   WIXELA INHUB 100-50 MCG/ACT AEPB INHALE 1 PUFF INTO THE LUNGS TWICE A DAY   [DISCONTINUED] benzonatate (TESSALON) 100 MG capsule Take 1 capsule (100 mg total) by mouth 3 (three) times daily as needed.   [DISCONTINUED] gabapentin (NEURONTIN) 100 MG capsule Take 200 mg by mouth at bedtime. (Patient not taking: Reported on 12/10/2023)   [DISCONTINUED] gabapentin (NEURONTIN) 600 MG tablet Take 600 mg by mouth daily. (Patient not  taking: Reported on 12/03/2023)   No facility-administered encounter medications on file as of 12/17/2023.     Lab Results  Component Value Date   WBC 6.5 09/17/2023   HGB 12.1 09/17/2023   HCT 37.9 09/17/2023   PLT 199 09/17/2023   GLUCOSE 238 (H) 12/15/2023   CHOL 157 12/15/2023   TRIG 173.0 (H) 12/15/2023   HDL 56.20 12/15/2023   LDLDIRECT 74.0 04/19/2019   LDLCALC 66 12/15/2023   ALT 11 12/15/2023   AST 17 12/15/2023   NA 140 12/15/2023   K 3.4 (L) 12/15/2023   CL 104 12/15/2023   CREATININE 1.10 12/15/2023   BUN 14 12/15/2023   CO2 28 12/15/2023   TSH 3.28 04/30/2023   INR 1.1 02/19/2023   HGBA1C 7.6 (H) 12/15/2023   MICROALBUR 6.6 (H) 04/30/2023       Assessment & Plan:  Elevated alkaline phosphatase level Assessment & Plan: Elevated alkaline phos. Discussed.  Will recheck alkaline phos and GGT.     Hypokalemia Assessment & Plan: Recheck potassium.    Iron deficiency anemia, unspecified iron deficiency anemia type Assessment & Plan: Seeing GI.  entyvio treatments for inflammatory polyposis.  Seeing hematology - iron infusions.  Follow.  Aneurysm [facility]) Assessment & Plan: Saw Dr Myer Haff [facility] - 01/2023 - At this point, she is asymptomatic. He recommended follow-up imaging. His office will arrange for MRI scan of the brain as well as MR angiogram of the brain in the summer. F/u telephone call - stable.  Recommended f/u in one year.    Mild intermittent asthma without complication Assessment & Plan: Breathing stable.  No increased cough and congestion.  Continue symbicort.  Has duonebs if needed.    Depression, major, single episode, mild (HCC) Assessment & Plan: Discussed.  Increased stress. Will notify if feels needs further intervention.  Follow.    Gastroesophageal reflux disease, unspecified whether esophagitis present Assessment & Plan: Continue omeprazole. No increased acid reflux reported.    Hypercholesterolemia Assessment &  Plan: Continue crestor. Follow lipid panel and liver function tests.     Primary hypertension Assessment & Plan: On amlodipine and losartan.  Follow pressures.  Follow metabolic panel.    Pseudopolyposis of colon with complication, unspecified part of colon [facility]) Assessment & Plan: S/p colonoscopy 07/24/21 as outlined.  Continues f/u with GI.  On entyvio. Keep f/u with GI.   Meningioma [facility]) Assessment & Plan: Evaluated by [facility].     Pituitary lesion [facility]) Assessment & Plan: Pituitary microadenoma -She was evaluated by neurology ([facility]) Recommended - MRI 6 months.  [facility] - 01/2023 - plan MRI/MRA.  Per 05/2023 - phone note - stable.  Recommended f/u in one year.    Psoriatic arthritis (HCC) Assessment & Plan: Stable.  Has been followed by rheumatology.     Severe obstructive sleep apnea Assessment & Plan: Needs to continue cpap. Seeing pulmonary.    Stress Assessment & Plan: Increased stress. Family stress. Discussed. Notify me if feel needs any further intervention.    Type 2 diabetes mellitus with diabetic nephropathy, without long-term current use of insulin (HCC) Assessment & Plan: On insulin.  Follow sugars. Follow met b and A1c. Low carb diet and exercise. Will hold on changing medication. A1c elevated, but has not been watching diet. Has not been taking medication. Get back on her medications regularly. Follow sugars. Send in readings.       Dale [place], MD

## 2023-12-18 ENCOUNTER — Encounter: Payer: Self-pay | Admitting: Internal Medicine

## 2023-12-18 DIAGNOSIS — E876 Hypokalemia: Secondary | ICD-10-CM | POA: Insufficient documentation

## 2023-12-18 NOTE — Assessment & Plan Note (Signed)
S/p colonoscopy 07/24/21 as outlined.  Continues f/u with GI.  On entyvio. Keep f/u with GI.  

## 2023-12-18 NOTE — Assessment & Plan Note (Signed)
 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  [facility] - 01/2023 - plan MRI/MRA.  Per 05/2023 - phone note - stable.  Recommended f/u in one year.

## 2023-12-18 NOTE — Assessment & Plan Note (Signed)
Evaluated by [facility].

## 2023-12-18 NOTE — Assessment & Plan Note (Signed)
On amlodipine and losartan.  Follow pressures.  Follow metabolic panel.  

## 2023-12-18 NOTE — Assessment & Plan Note (Signed)
On insulin.  Follow sugars. Follow met b and A1c. Low carb diet and exercise. Will hold on changing medication. A1c elevated, but has not been watching diet. Has not been taking medication. Get back on her medications regularly. Follow sugars. Send in readings.

## 2023-12-18 NOTE — Assessment & Plan Note (Signed)
Recheck potassium 

## 2023-12-18 NOTE — Assessment & Plan Note (Signed)
Seeing GI.  entyvio treatments for inflammatory polyposis.  Seeing hematology - iron infusions.  Follow.  

## 2023-12-18 NOTE — Assessment & Plan Note (Signed)
Needs to continue cpap. Seeing pulmonary.

## 2023-12-18 NOTE — Assessment & Plan Note (Signed)
 Continue crestor. Follow lipid panel and liver function tests.

## 2023-12-18 NOTE — Assessment & Plan Note (Signed)
 Saw Dr Myer Haff [facility] - 01/2023 - At this point, she is asymptomatic. He recommended follow-up imaging. His office will arrange for MRI scan of the brain as well as MR angiogram of the brain in the summer. F/u telephone call - stable.  Recommended f/u in one year.

## 2023-12-18 NOTE — Assessment & Plan Note (Signed)
Breathing stable.  No increased cough and congestion.  Continue symbicort.  Has duonebs if needed.

## 2023-12-18 NOTE — Assessment & Plan Note (Signed)
Increased stress. Family stress. Discussed. Notify me if feel needs any further intervention.

## 2023-12-18 NOTE — Assessment & Plan Note (Signed)
Discussed.  Increased stress. Will notify if feels needs further intervention.  Follow.

## 2023-12-18 NOTE — Assessment & Plan Note (Addendum)
Continue omeprazole. No increased acid reflux reported.

## 2023-12-18 NOTE — Assessment & Plan Note (Signed)
Elevated alkaline phos. Discussed.  Will recheck alkaline phos and GGT.

## 2023-12-18 NOTE — Assessment & Plan Note (Signed)
Stable.  Has been followed by rheumatology.   

## 2023-12-21 ENCOUNTER — Encounter: Payer: Self-pay | Admitting: Oncology

## 2023-12-21 ENCOUNTER — Inpatient Hospital Stay: Payer: PPO | Attending: Oncology

## 2023-12-21 ENCOUNTER — Inpatient Hospital Stay: Payer: PPO | Admitting: Oncology

## 2023-12-21 VITALS — BP 142/52 | HR 76 | Temp 98.8°F | Resp 19 | Wt 142.4 lb

## 2023-12-21 DIAGNOSIS — E876 Hypokalemia: Secondary | ICD-10-CM

## 2023-12-21 DIAGNOSIS — D509 Iron deficiency anemia, unspecified: Secondary | ICD-10-CM | POA: Insufficient documentation

## 2023-12-21 DIAGNOSIS — D508 Other iron deficiency anemias: Secondary | ICD-10-CM

## 2023-12-21 DIAGNOSIS — E538 Deficiency of other specified B group vitamins: Secondary | ICD-10-CM | POA: Diagnosis not present

## 2023-12-21 DIAGNOSIS — R748 Abnormal levels of other serum enzymes: Secondary | ICD-10-CM

## 2023-12-21 LAB — IRON AND TIBC
Iron: 89 ug/dL (ref 28–170)
Saturation Ratios: 31 % (ref 10.4–31.8)
TIBC: 290 ug/dL (ref 250–450)
UIBC: 201 ug/dL

## 2023-12-21 LAB — CBC
HCT: 35.9 % — ABNORMAL LOW (ref 36.0–46.0)
Hemoglobin: 11.8 g/dL — ABNORMAL LOW (ref 12.0–15.0)
MCH: 30.3 pg (ref 26.0–34.0)
MCHC: 32.9 g/dL (ref 30.0–36.0)
MCV: 92.1 fL (ref 80.0–100.0)
Platelets: 218 10*3/uL (ref 150–400)
RBC: 3.9 MIL/uL (ref 3.87–5.11)
RDW: 13.2 % (ref 11.5–15.5)
WBC: 6.6 10*3/uL (ref 4.0–10.5)
nRBC: 0 % (ref 0.0–0.2)

## 2023-12-21 LAB — FERRITIN: Ferritin: 67 ng/mL (ref 11–307)

## 2023-12-22 ENCOUNTER — Telehealth: Payer: Self-pay

## 2023-12-22 ENCOUNTER — Other Ambulatory Visit: Payer: PPO

## 2023-12-22 ENCOUNTER — Telehealth: Payer: PPO | Admitting: Gastroenterology

## 2023-12-22 ENCOUNTER — Encounter: Payer: Self-pay | Admitting: Oncology

## 2023-12-22 ENCOUNTER — Encounter: Payer: Self-pay | Admitting: Gastroenterology

## 2023-12-22 ENCOUNTER — Ambulatory Visit: Payer: PPO | Admitting: Oncology

## 2023-12-22 DIAGNOSIS — D500 Iron deficiency anemia secondary to blood loss (chronic): Secondary | ICD-10-CM | POA: Diagnosis not present

## 2023-12-22 DIAGNOSIS — K529 Noninfective gastroenteritis and colitis, unspecified: Secondary | ICD-10-CM

## 2023-12-22 DIAGNOSIS — K8681 Exocrine pancreatic insufficiency: Secondary | ICD-10-CM

## 2023-12-22 DIAGNOSIS — E538 Deficiency of other specified B group vitamins: Secondary | ICD-10-CM

## 2023-12-22 DIAGNOSIS — K51411 Inflammatory polyps of colon with rectal bleeding: Secondary | ICD-10-CM | POA: Diagnosis not present

## 2023-12-22 NOTE — Telephone Encounter (Signed)
Called and made patient a follow up appointment. Patient states she has had her gallbladder taken out. Can she still taking the Viberzi?

## 2023-12-22 NOTE — Progress Notes (Signed)
Hematology/Oncology Consult note [facility]  [phone] [phone]  Patient Care Team: Dale Afton, MD as PCP - General (Internal Medicine) Creig Hines, MD as Consulting Physician (Oncology) Loree Fee, [facility] (Pharmacist) Noemi Chapel, NP as Nurse Practitioner (Nurse Practitioner)   Name of the patient: Ashley Gross  [id number]  12/03/1950   Date of visit: 12/22/23  Diagnosis-iron deficiency anemia  Chief complaint/ Reason for visit-routine follow-up of iron deficiency anemia  Heme/Onc history: patient is a 73 year-old female who was referred to GI for symptoms of diarrhea and iron deficiency anemia.  She has a history of inflammatory polyposis of the colon based on colonoscopy in September 2022.  She had some large polyps removed at that time which helped with anemia.  However patient continued to have intermittent rectal bleeding and after seeing Dr. Allegra Lai plan is to try [facility] for improvement of her inflammatory polyposis.     Interval history-symptoms of inflammatory bowel disease is presently not well-controlled.  She still has occasional episodes of diarrhea sometimes blood-tinged.  She is seeing Dr. Allegra Lai soon.  She remains on Entyvio  ECOG PS- 1 Pain scale- 0   Review of systems- Review of Systems  Constitutional:  Negative for chills, fever, malaise/fatigue and weight loss.  HENT:  Negative for congestion, ear discharge and nosebleeds.   Eyes:  Negative for blurred vision.  Respiratory:  Negative for cough, hemoptysis, sputum production, shortness of breath and wheezing.   Cardiovascular:  Negative for chest pain, palpitations, orthopnea and claudication.  Gastrointestinal:  Positive for diarrhea. Negative for abdominal pain, blood in stool, constipation, heartburn, melena, nausea and vomiting.  Genitourinary:  Negative for dysuria, flank pain, frequency, hematuria and urgency.  Musculoskeletal:  Negative  for back pain, joint pain and myalgias.  Skin:  Negative for rash.  Neurological:  Negative for dizziness, tingling, focal weakness, seizures, weakness and headaches.  Endo/Heme/Allergies:  Does not bruise/bleed easily.  Psychiatric/Behavioral:  Negative for depression and suicidal ideas. The patient does not have insomnia.       Allergies  Allergen Reactions   Aspirin Other (See Comments)    Increased bleeding   Beta Adrenergic Blockers     3rd degree blockage    Lisinopril Cough   Mtx Support [Cobalamine Combinations] Other (See Comments)    Respiratory symptoms   Nsaids    Vasotec [Enalapril] Cough   Verapamil Other (See Comments)    Heart block   Voltaren [Diclofenac Sodium]     Pt unsure of reaction    Erythromycin Other (See Comments)    Gi intolerance   Indocin [Indomethacin]     Upset stomach   Jardiance [Empagliflozin] Other (See Comments)    Recurrent yeast infections, even with washout and rechallenge     Past Medical History:  Diagnosis Date   Abnormal liver function test 10/06/2012   Anemia 01/25/2022   Anemia, unspecified    Asthma    B12 deficiency 02/11/2019   Bleeding internal hemorrhoids 09/13/2017   Choledocholithiasis 06/29/2012   Formatting of this note might be different from the original. Dilated CBD on abdominal imaging 04/2012   Chronic diarrhea 02/16/2013   Colitis    Complication of anesthesia    asthma attack after shoulder surgery   Diabetes mellitus (HCC)    type II   Esophagitis    GERD (gastroesophageal reflux disease)    Heart murmur    for years- nothing to be concerned about.   History of  kidney stones    Hx of arteriovenous malformation (AVM)    Hypertension    IBS (irritable bowel syndrome)    Loss of weight    Peripheral vascular disease (HCC)    Pernicious anemia    Personal history of colonic polyps 02/10/2013   02/08/13 colonoscopy - question of rectal varices, two 3-78mm polyps in the sigmoid colon, mass at the hepatic  flexure, one 5mm polyp at the hepatic flexure, nodule at the ileocecal valve, congested mucusa in the entire colon, flattened villi mucosa in the terminal ileum.     Pneumonia    Psoriatic arthritis (HCC)    s/p penicillamine, plaquenil, MTX, sulfasalazine.  s/p Embrel, Humira,.  Iritis.     Pure hypercholesterolemia    Rectal bleeding 07/15/2017   Stroke (HCC) 05/29/2022   per patient     Past Surgical History:  Procedure Laterality Date   ABDOMINAL HYSTERECTOMY  1981   ovaries left in place   ANKLE SURGERY     right   BUNIONECTOMY     CHOLECYSTECTOMY  1989   COLONOSCOPY WITH PROPOFOL N/A 12/27/2017   Procedure: COLONOSCOPY WITH PROPOFOL;  Surgeon: Toney Reil, MD;  Location: [facility] ENDOSCOPY;  Service: Gastroenterology;  Laterality: N/A;   COLONOSCOPY WITH PROPOFOL N/A 01/04/2019   Procedure: COLONOSCOPY WITH PROPOFOL;  Surgeon: Toney Reil, MD;  Location: [facility];  Service: Endoscopy;  Laterality: N/A;  Diabetic - oral and injectable   COLONOSCOPY WITH PROPOFOL N/A 07/24/2021   Procedure: COLONOSCOPY WITH PROPOFOL;  Surgeon: Toney Reil, MD;  Location: [facility];  Service: Endoscopy;  Laterality: N/A;  Diabetic   ESOPHAGOGASTRODUODENOSCOPY (EGD) WITH PROPOFOL N/A 12/27/2017   Procedure: ESOPHAGOGASTRODUODENOSCOPY (EGD) WITH PROPOFOL;  Surgeon: Toney Reil, MD;  Location: [facility] ENDOSCOPY;  Service: Gastroenterology;  Laterality: N/A;   HAND SURGERY     right   HEEL SPUR SURGERY     IR 3D INDEPENDENT WKST  10/01/2021   IR ANGIO INTRA EXTRACRAN SEL COM CAROTID INNOMINATE UNI L MOD SED  10/01/2021   IR ANGIO INTRA EXTRACRAN SEL INTERNAL CAROTID UNI R MOD SED  10/01/2021   IR ANGIO INTRA EXTRACRAN SEL INTERNAL CAROTID UNI R MOD SED  05/29/2022   IR ANGIO VERTEBRAL SEL VERTEBRAL BILAT MOD SED  10/01/2021   IR ANGIO VERTEBRAL SEL VERTEBRAL UNI R MOD SED  05/29/2022   IR CT HEAD LTD  10/01/2021   IR TRANSCATH/EMBOLIZ  10/01/2021   IR US  GUIDE VASC ACCESS RIGHT  10/01/2021   IR US GUIDE VASC ACCESS RIGHT  05/29/2022   NOSE SURGERY     x2   POLYPECTOMY  01/04/2019   Procedure: POLYPECTOMY;  Surgeon: Toney Reil, MD;  Location: [facility];  Service: Endoscopy;;   POLYPECTOMY N/A 07/24/2021   Procedure: POLYPECTOMY;  Surgeon: Toney Reil, MD;  Location: [facility];  Service: Endoscopy;  Laterality: N/A;   RADIOLOGY WITH ANESTHESIA N/A 10/01/2021   Procedure: Alfredia Client WITH ANESTHESIA;  Surgeon: Baldemar Lenis, MD;  Location: [facility] OR;  Service: Radiology;  Laterality: N/A;   SHOULDER ARTHROSCOPY WITH OPEN ROTATOR CUFF REPAIR Left 09/20/2018   Procedure: SHOULDER ARTHROSCOPY WITH OPEN ROTATOR CUFF REPAIR;  Surgeon: Christena Flake, MD;  Location: [facility] ORS;  Service: Orthopedics;  Laterality: Left;   SHOULDER CLOSED REDUCTION Left 12/21/2018   Procedure: MANIPULATION UNDER ANESTHESIA WITH STEROID INJECTION;  Surgeon: Christena Flake, MD;  Location: [facility];  Service: Orthopedics;  Laterality: Left;  Diabetic - insulin and oral meds   UMBILICAL HERNIA REPAIR     XI ROBOTIC ASSISTED INGUINAL HERNIA REPAIR WITH MESH Left 12/16/2021   Procedure: XI ROBOTIC ASSISTED INGUINAL HERNIA REPAIR WITH MESH;  Surgeon: [name], MD;  Location: [facility] ORS;  Service: General;  Laterality: Left;    Social History   Socioeconomic History   Marital status: Widowed    Spouse name: Not on file   Number of children: 2   Years of education: Not on file   Highest education level: Associate degree: occupational, Scientist, product/process development, or vocational program  Occupational History   Not on file  Tobacco Use   Smoking status: Never    Passive exposure: Never   Smokeless tobacco: Never  Vaping Use   Vaping status: Never Used  Substance and Sexual Activity   Alcohol use: Never   Drug use: Never   Sexual activity: Not Currently  Other Topics Concern   Not on file  Social History Narrative   She is  married and has two children   Grand daughter lives with her.   Social Drivers of Corporate investment banker Strain: Low Risk  (03/11/2023)   Overall Financial Resource Strain (CARDIA)    Difficulty of Paying Living Expenses: Not very hard  Food Insecurity: No Food Insecurity (03/11/2023)   Hunger Vital Sign    Worried About Running Out of Food in the Last Year: Never true    Ran Out of Food in the Last Year: Never true  Transportation Needs: No Transportation Needs (03/11/2023)   PRAPARE - Administrator, Civil Service (Medical): No    Lack of Transportation (Non-Medical): No  Physical Activity: Unknown (03/11/2023)   Exercise Vital Sign    Days of Exercise per Week: 0 days    Minutes of Exercise per Session: Not on file  Stress: Stress Concern Present (03/11/2023)   [employer] of Occupational Health - Occupational Stress Questionnaire    Feeling of Stress : Very much  Social Connections: Moderately Integrated (03/11/2023)   Social Connection and Isolation Panel [NHANES]    Frequency of Communication with Friends and Family: More than three times a week    Frequency of Social Gatherings with Friends and Family: More than three times a week    Attends Religious Services: More than 4 times per year    Active Member of [employer] or Organizations: Yes    Attends Banker Meetings: More than 4 times per year    Marital Status: Widowed  Intimate Partner Violence: Not At Risk (02/19/2023)   Humiliation, Afraid, Rape, and Kick questionnaire    Fear of Current or Ex-Partner: No    Emotionally Abused: No    Physically Abused: No    Sexually Abused: No    Family History  Problem Relation Age of Onset   Diabetes Other    Hypertension Other    Breast cancer Neg Hx    Colon cancer Neg Hx      Current Outpatient Medications:    acetaminophen (TYLENOL) 500 MG tablet, Take 2 tablets (1,000 mg total) by mouth every 6 (six) hours as needed for mild pain., Disp: , Rfl:     albuterol (VENTOLIN HFA) 108 (90 Base) MCG/ACT inhaler, Inhale 2 puffs into the lungs every 6 (six) hours as needed for wheezing or shortness of breath., Disp: , Rfl:    amLODipine (NORVASC) 5 MG tablet, TAKE 1 TABLET(5 MG) BY MOUTH TWICE DAILY, Disp: 180 tablet, Rfl: 1  Azelastine HCl 137 MCG/SPRAY SOLN, PLACE 2 SPRAYS INTO BOTH NOSTRILS 2 (TWO) TIMES DAILY. USE IN EACH NOSTRIL AS DIRECTED, Disp: 90 mL, Rfl: 1   cetirizine (ZYRTEC) 10 MG tablet, Take 10 mg by mouth daily., Disp: , Rfl:    Cholecalciferol (VITAMIN D) 50 MCG (2000 UT) tablet, Take 2,000 Units by mouth daily., Disp: , Rfl:    CREON 36000-114000 units CPEP capsule, Take 1-2 capsules (36,000-72,000 Units total) by mouth See admin instructions. Take 720000 units with each meal and 16109 units with snacks, Disp: 240 capsule, Rfl: 3   Eszopiclone 3 MG TABS, Take 1 tablet (3 mg total) by mouth at bedtime. Take immediately before bedtime, Disp: 30 tablet, Rfl: 5   insulin degludec (TRESIBA FLEXTOUCH) 100 UNIT/ML FlexTouch Pen, Inject 16 Units into the skin daily. (Patient taking differently: Inject 14 Units into the skin daily.), Disp: 15 mL, Rfl: 1   loperamide (IMODIUM) 2 MG capsule, Take 4 mg by mouth daily., Disp: , Rfl:    losartan (COZAAR) 100 MG tablet, TAKE 1 TABLET(100 MG) BY MOUTH AT BEDTIME, Disp: 90 tablet, Rfl: 1   mometasone (ELOCON) 0.1 % ointment, Apply 1 Application topically 2 (two) times daily., Disp: , Rfl:    omeprazole (PRILOSEC) 20 MG capsule, TAKE 1 CAPSULE(20 MG) BY MOUTH TWICE DAILY, Disp: 180 capsule, Rfl: 3   ONE TOUCH ULTRA TEST test strip, PATIENT NEEDS NEW METER STRIPS AND LANCETS FOR ONE TOUCH. HER INSURANCE NO LONGER COVERS ACCUCHEK., Disp: 100 each, Rfl: PRN   rosuvastatin (CRESTOR) 10 MG tablet, TAKE 1 TABLET(10 MG) BY MOUTH DAILY, Disp: 90 tablet, Rfl: 3   vedolizumab (ENTYVIO) 300 MG injection, Inject 300 mg into the vein every 6 (six) weeks., Disp: , Rfl:    WIXELA INHUB 100-50 MCG/ACT AEPB, INHALE 1 PUFF  INTO THE LUNGS TWICE A DAY, Disp: 60 each, Rfl: 3  Physical exam:  Vitals:   12/21/23 1327  BP: (!) 142/52  Pulse: 76  Resp: 19  Temp: 98.8 F (37.1 C)  TempSrc: Tympanic  SpO2: 100%  Weight: 142 lb 6.4 oz (64.6 kg)   Physical Exam Cardiovascular:     Rate and Rhythm: Normal rate and regular rhythm.     Heart sounds: Normal heart sounds.  Pulmonary:     Effort: Pulmonary effort is normal.     Breath sounds: Normal breath sounds.  Skin:    General: Skin is warm and dry.  Neurological:     Mental Status: She is alert and oriented to person, place, and time.         Latest Ref Rng & Units 12/15/2023    9:16 AM  CMP  Glucose 70 - 99 mg/dL 604   BUN 6 - 23 mg/dL 14   Creatinine 5.40 - 1.20 mg/dL 9.81   Sodium 191 - 478 mEq/L 140   Potassium 3.5 - 5.1 mEq/L 3.4   Chloride 96 - 112 mEq/L 104   CO2 19 - 32 mEq/L 28   Calcium 8.4 - 10.5 mg/dL 8.6   Total Protein 6.0 - 8.3 g/dL 6.3   Total Bilirubin 0.2 - 1.2 mg/dL 0.6   Alkaline Phos 39 - 117 U/L 127   AST 0 - 37 U/L 17   ALT 0 - 35 U/L 11       Latest Ref Rng & Units 12/21/2023    1:15 PM  CBC  WBC 4.0 - 10.5 K/uL 6.6   Hemoglobin 12.0 - 15.0 g/dL 29.5   Hematocrit 62.1 -  46.0 % 35.9   Platelets 150 - 400 K/uL 218     No images are attached to the encounter.  DG Chest 2 View Result Date: 12/03/2023 CLINICAL DATA:  Fever, cough and history of asthma. EXAM: CHEST - 2 VIEW COMPARISON:  02/18/2023 FINDINGS: The heart size and mediastinal contours are within normal limits. There is no evidence of pulmonary edema, consolidation, pneumothorax, nodule or pleural fluid. The visualized skeletal structures are unremarkable. IMPRESSION: No active cardiopulmonary disease. Electronically Signed   By: Irish Lack M.D.   On: 12/03/2023 17:05     Assessment and plan- Patient is a 73 y.o. female here for routine follow-up of iron deficiency anemia  Patient has mild anemia with a hemoglobin of 11.8 which is close to her  baseline.  Ferritin levels are normal at 67 although they can be difficult to interpret in the setting of inflammatory bowel disease.  Iron studies are within normal limits with an iron saturation of 31%.  She does not require any IV iron at this time.  Repeat CBC ferritin and iron studies in 4 and 8 months and I will see her back in 8 months   Visit Diagnosis 1. Other iron deficiency anemia   2. B12 deficiency      Dr. Owens Shark, MD, MPH [facility] at [facility] [phone] 12/22/2023 12:08 PM

## 2023-12-22 NOTE — Progress Notes (Unsigned)
Ashley Donath, MD [address]  [address]  [place], [place] [address]  Main: [phone]  Fax: [phone]    Gastroenterology Consultation Video Visit  Referring Provider:     Dale [place], MD Primary Care Physician:  Ashley [place], MD Primary Gastroenterologist:  Dr. Arlyss Gross Reason for Consultation: Inflammatory polyposis, chronic diarrhea, iron deficiency anemia        HPI:   Ashley Gross is a 73 y.o. female referred by Dr. Dale [place], MD  for consultation & management of inflammatory polyposis, chronic diarrhea, iron deficiency anemia  Virtual Visit Video Note  I connected with Ashley Gross on 12/23/23 at  2:00 PM EST by video and verified that I am speaking with the correct person using two identifiers.   I discussed the limitations, risks, security and privacy concerns of performing an evaluation and management service by video and the availability of in person appointments. I also discussed with the patient that there may be a patient responsible charge related to this service. The patient expressed understanding and agreed to proceed.  Location of the Patient: Home  Location of the provider: Office  Persons participating in the visit: Patient and provider only   History of Present Illness: Ashley Gross is a 73 y.o. white female with h/o psoriatic arthritis, chronic diarrhea, inflammatory polyps in colon presents for f/u of rectal bleeding and chronic diarrhea. She underwent ligation of RA, RP. LL internal hemorrhoids. Had extensive workup in the past which is negative for gastrinoma, IBD, microscopic colitis, celiac disease, chronic infections.  Rectal bleeding had resolved after resection of large inflammatory polyps.  However, she developed severe weight loss, severe iron deficiency anemia with recurrence of rectal bleeding.  Therefore, she was initiated on Entyvio monotherapy in July 2023.  Entyvio maintenance therapy increased to 2  every 6 weeks in 06/2023.  Patient states that January was a stressful month for her because of shifting her house, she had flareup of GI symptoms that has resulted in weight loss as well.  She is now settling down and focusing on her health and oral intake.  Her iron deficiency anemia has been stable, recently saw Dr. Smith Gross, iron levels are within normal limits, iron replacement on hold.  Does report intermittent scant rectal bleeding which has improved since January.  She has regained few pounds.  She does not have any specific concerns today.  She continues to take Imodium 2 to 3 pills daily in order to maintain her bowel frequency 3-4 times per day, consistency is still loose/soft.  Continues to take Creon as well.    Past Medical History:  Diagnosis Date   Abnormal liver function test 10/06/2012   Anemia 01/25/2022   Anemia, unspecified    Asthma    B12 deficiency 02/11/2019   Bleeding internal hemorrhoids 09/13/2017   Choledocholithiasis 06/29/2012   Formatting of this note might be different from the original. Dilated CBD on abdominal imaging 04/2012   Chronic diarrhea 02/16/2013   Colitis    Complication of anesthesia    asthma attack after shoulder surgery   Diabetes mellitus (HCC)    type II   Esophagitis    GERD (gastroesophageal reflux disease)    Heart murmur    for years- nothing to be concerned about.   History of kidney stones    Hx of arteriovenous malformation (AVM)    Hypertension    IBS (irritable bowel syndrome)    Loss of weight  Peripheral vascular disease (HCC)    Pernicious anemia    Personal history of colonic polyps 02/10/2013   02/08/13 colonoscopy - question of rectal varices, two 3-68mm polyps in the sigmoid colon, mass at the hepatic flexure, one 5mm polyp at the hepatic flexure, nodule at the ileocecal valve, congested mucusa in the entire colon, flattened villi mucosa in the terminal ileum.     Pneumonia    Psoriatic arthritis (HCC)    s/p penicillamine,  plaquenil, MTX, sulfasalazine.  s/p Embrel, Humira,.  Iritis.     Pure hypercholesterolemia    Rectal bleeding 07/15/2017   Stroke (HCC) 05/29/2022   per patient    Past Surgical History:  Procedure Laterality Date   ABDOMINAL HYSTERECTOMY  1981   ovaries left in place   ANKLE SURGERY     right   BUNIONECTOMY     CHOLECYSTECTOMY  1989   COLONOSCOPY WITH PROPOFOL N/A 12/27/2017   Procedure: COLONOSCOPY WITH PROPOFOL;  Surgeon: Toney Reil, MD;  Location: [facility] ENDOSCOPY;  Service: Gastroenterology;  Laterality: N/A;   COLONOSCOPY WITH PROPOFOL N/A 01/04/2019   Procedure: COLONOSCOPY WITH PROPOFOL;  Surgeon: Toney Reil, MD;  Location: [facility] SURGERY CNTR;  Service: Endoscopy;  Laterality: N/A;  Diabetic - oral and injectable   COLONOSCOPY WITH PROPOFOL N/A 07/24/2021   Procedure: COLONOSCOPY WITH PROPOFOL;  Surgeon: Toney Reil, MD;  Location: [facility] SURGERY CNTR;  Service: Endoscopy;  Laterality: N/A;  Diabetic   ESOPHAGOGASTRODUODENOSCOPY (EGD) WITH PROPOFOL N/A 12/27/2017   Procedure: ESOPHAGOGASTRODUODENOSCOPY (EGD) WITH PROPOFOL;  Surgeon: Toney Reil, MD;  Location: [facility] ENDOSCOPY;  Service: Gastroenterology;  Laterality: N/A;   HAND SURGERY     right   HEEL SPUR SURGERY     IR 3D INDEPENDENT WKST  10/01/2021   IR ANGIO INTRA EXTRACRAN SEL COM CAROTID INNOMINATE UNI L MOD SED  10/01/2021   IR ANGIO INTRA EXTRACRAN SEL INTERNAL CAROTID UNI R MOD SED  10/01/2021   IR ANGIO INTRA EXTRACRAN SEL INTERNAL CAROTID UNI R MOD SED  05/29/2022   IR ANGIO VERTEBRAL SEL VERTEBRAL BILAT MOD SED  10/01/2021   IR ANGIO VERTEBRAL SEL VERTEBRAL UNI R MOD SED  05/29/2022   IR CT HEAD LTD  10/01/2021   IR TRANSCATH/EMBOLIZ  10/01/2021   IR US GUIDE VASC ACCESS RIGHT  10/01/2021   IR US GUIDE VASC ACCESS RIGHT  05/29/2022   NOSE SURGERY     x2   POLYPECTOMY  01/04/2019   Procedure: POLYPECTOMY;  Surgeon: Toney Reil, MD;  Location: [facility] SUR[facility];  Service:  Endoscopy;;   POLYPECTOMY N/A 07/24/2021   Procedure: POLYPECTOMY;  Surgeon: Toney Reil, MD;  Location: [facility] SURGERY CNTR;  Service: Endoscopy;  Laterality: N/A;   RADIOLOGY WITH ANESTHESIA N/A 10/01/2021   Procedure: Alfredia Client WITH ANESTHESIA;  Surgeon: Baldemar Lenis, MD;  Location: [facility] OR;  Service: Radiology;  Laterality: N/A;   SHOULDER ARTHROSCOPY WITH OPEN ROTATOR CUFF REPAIR Left 09/20/2018   Procedure: SHOULDER ARTHROSCOPY WITH OPEN ROTATOR CUFF REPAIR;  Surgeon: Christena Flake, MD;  Location: [facility] ORS;  Service: Orthopedics;  Laterality: Left;   SHOULDER CLOSED REDUCTION Left 12/21/2018   Procedure: MANIPULATION UNDER ANESTHESIA WITH STEROID INJECTION;  Surgeon: Christena Flake, MD;  Location: [facility] SURGERY CNTR;  Service: Orthopedics;  Laterality: Left;  Diabetic - insulin and oral meds   UMBILICAL HERNIA REPAIR     XI ROBOTIC ASSISTED INGUINAL HERNIA REPAIR WITH MESH Left 12/16/2021   Procedure: XI ROBOTIC ASSISTED  INGUINAL HERNIA REPAIR WITH MESH;  Surgeon: [name], MD;  Location: [facility] ORS;  Service: General;  Laterality: Left;     Current Outpatient Medications:    acetaminophen (TYLENOL) 500 MG tablet, Take 2 tablets (1,000 mg total) by mouth every 6 (six) hours as needed for mild pain., Disp: , Rfl:    albuterol (VENTOLIN HFA) 108 (90 Base) MCG/ACT inhaler, Inhale 2 puffs into the lungs every 6 (six) hours as needed for wheezing or shortness of breath., Disp: , Rfl:    amLODipine (NORVASC) 5 MG tablet, TAKE 1 TABLET(5 MG) BY MOUTH TWICE DAILY, Disp: 180 tablet, Rfl: 1   Azelastine HCl 137 MCG/SPRAY SOLN, PLACE 2 SPRAYS INTO BOTH NOSTRILS 2 (TWO) TIMES DAILY. USE IN EACH NOSTRIL AS DIRECTED, Disp: 90 mL, Rfl: 1   cetirizine (ZYRTEC) 10 MG tablet, Take 10 mg by mouth daily., Disp: , Rfl:    Cholecalciferol (VITAMIN D) 50 MCG (2000 UT) tablet, Take 2,000 Units by mouth daily., Disp: , Rfl:    CREON 36000-114000 units CPEP capsule, Take 1-2 capsules  (36,000-72,000 Units total) by mouth See admin instructions. Take 720000 units with each meal and 16109 units with snacks, Disp: 240 capsule, Rfl: 3   diphenoxylate-atropine (LOMOTIL) 2.5-0.025 MG tablet, Take 1 tablet by mouth 4 (four) times daily as needed for diarrhea or loose stools., Disp: 90 tablet, Rfl: 0   Eszopiclone 3 MG TABS, Take 1 tablet (3 mg total) by mouth at bedtime. Take immediately before bedtime, Disp: 30 tablet, Rfl: 5   insulin degludec (TRESIBA FLEXTOUCH) 100 UNIT/ML FlexTouch Pen, Inject 16 Units into the skin daily. (Patient taking differently: Inject 14 Units into the skin daily.), Disp: 15 mL, Rfl: 1   loperamide (IMODIUM) 2 MG capsule, Take 4 mg by mouth daily., Disp: , Rfl:    losartan (COZAAR) 100 MG tablet, TAKE 1 TABLET(100 MG) BY MOUTH AT BEDTIME, Disp: 90 tablet, Rfl: 1   mometasone (ELOCON) 0.1 % ointment, Apply 1 Application topically 2 (two) times daily., Disp: , Rfl:    omeprazole (PRILOSEC) 20 MG capsule, TAKE 1 CAPSULE(20 MG) BY MOUTH TWICE DAILY, Disp: 180 capsule, Rfl: 3   ONE TOUCH ULTRA TEST test strip, PATIENT NEEDS NEW METER STRIPS AND LANCETS FOR ONE TOUCH. HER INSURANCE NO LONGER COVERS ACCUCHEK., Disp: 100 each, Rfl: PRN   rosuvastatin (CRESTOR) 10 MG tablet, TAKE 1 TABLET(10 MG) BY MOUTH DAILY, Disp: 90 tablet, Rfl: 3   vedolizumab (ENTYVIO) 300 MG injection, Inject 300 mg into the vein 4 (four) times a week., Disp: , Rfl:    WIXELA INHUB 100-50 MCG/ACT AEPB, INHALE 1 PUFF INTO THE LUNGS TWICE A DAY, Disp: 60 each, Rfl: 3   Family History  Problem Relation Age of Onset   Diabetes Other    Hypertension Other    Breast cancer Neg Hx    Colon cancer Neg Hx      Social History   Tobacco Use   Smoking status: Never    Passive exposure: Never   Smokeless tobacco: Never  Vaping Use   Vaping status: Never Used  Substance Use Topics   Alcohol use: Never   Drug use: Never    Allergies as of 12/22/2023 - Review Complete 12/22/2023  Allergen  Reaction Noted   Aspirin Other (See Comments) 10/06/2012   Beta adrenergic blockers  10/06/2012   Lisinopril Cough 12/21/2018   Mtx support [cobalamine combinations] Other (See Comments) 10/06/2012   Nsaids  02/18/2023   Vasotec [enalapril] Cough 10/06/2012  Verapamil Other (See Comments) 10/06/2012   Voltaren [diclofenac sodium]  10/06/2012   Erythromycin Other (See Comments) 10/06/2012   Indocin [indomethacin]  10/06/2012   Jardiance [empagliflozin] Other (See Comments) 02/26/2020    Imaging Studies: Reviewed  Assessment and Plan:   Ashley Gross is a 73 y.o. white female with h/o psoriatic arthritis, chronic diarrhea, inflammatory polyposis of colon presents for f/u of rectal bleeding and chronic diarrhea. She underwent ligation of RA, RP. LL internal hemorrhoids. Had extensive workup in the past which is negative for gastrinoma, IBD, microscopic colitis, celiac disease, chronic infections.  Rectal bleeding had resolved after resection of large inflammatory polyps.  However, she developed severe weight loss, severe iron deficiency anemia with recurrence of rectal bleeding.  Therefore, she was initiated on Entyvio monotherapy in July 2023, increased maintenance to every 6 weeks in 06/2023.  Recent exacerbation of symptoms in January 2025, currently resolved    Inflammatory polyposis of the colon are inflammatory Polyposis is a rare phenomenon which can lead to rectal bleeding and chronic diarrhea.  This may or may not be associated with inflammatory bowel disease.  Her colon biopsies were negative for inflammatory bowel disease.  Few case reports have been mentioned in the literature that in addition to removal of the large polyps, there might be a role for immunosuppressive therapy such as Biologics.  After discussing about the role of Entyvio, patient has been consented to start Entyvio monotherapy.  Her symptoms have resolved for several months, then she had breakthrough symptoms of  diarrhea and rectal bleeding about 2 weeks before she is due for her next Entyvio infusion.  Undetectable vedolizumab antibodies, therefore increased frequency to every 6 weeks in 06/2023, has been tolerating it well. Will switch from Imodium to Lomotil due to persistent diarrhea Will switch from Entyvio infusion to subcutaneous injections every 4 weeks   Severe exocrine pancreatic insufficiency: Weight loss has resolved, Normal BMI Fecal elastase level 64 in 07/2021 Continue Creon 36K 2 capsules with each meal and 1 with snack, based on her weight CT abdomen and pelvis with contrast revealed diffuse edema of the large bowel secondary to inflammatory polyposis     Iron and B12 deficiency anemia: Has mild normocytic anemia Iron levels are currently normal Continue to follow-up with hematology for parenteral iron therapy as needed   Follow Up Instructions:   I discussed the assessment and treatment plan with the patient. The patient was provided an opportunity to ask questions and all were answered. The patient agreed with the plan and demonstrated an understanding of the instructions.   The patient was advised to call back or seek an in-person evaluation if the symptoms worsen or if the condition fails to improve as anticipated.  I provided 20 minutes of face-to-face time during this encounter.   Follow up in 3 months   Ashley Repress, MD

## 2023-12-22 NOTE — Telephone Encounter (Signed)
Per Dr. Chauncey Mann apply for Entyvio subcut every 4 weeks, also advised her to pick up Viberzi samples 75mg , start with once a day and then increase to twice daily, follow-up in 3 to 4 months

## 2023-12-23 MED ORDER — ENTYVIO 300 MG IV SOLR: 300.0000 mg | INTRAVENOUS | Status: AC

## 2023-12-23 MED ORDER — DIPHENOXYLATE-ATROPINE 2.5-0.025 MG PO TABS: 1.0000 | ORAL_TABLET | Freq: Four times a day (QID) | ORAL | 0 refills | Status: AC | PRN

## 2023-12-23 NOTE — Telephone Encounter (Signed)
Let's not do Viberzi since she had cholecystectomy Will try Lomotil instead of Imodium Recommend fasting serum gastrin levels and please inform the patient  RV

## 2023-12-23 NOTE — Telephone Encounter (Signed)
Order the lab work and called patient and left a message for call back. Called Entyvio [facility] and they state that just need the prescription form filled out on the patient assistance form and faxed in to them. Sent a new order to [facility]

## 2023-12-23 NOTE — Telephone Encounter (Signed)
Called [facility] and informed them that we faxed the order from every 4 weeks to every 6 weeks. She states that they have the order by fax and will get patient changed

## 2023-12-23 NOTE — Telephone Encounter (Signed)
Informed patient of this information and she verbalized understanding. She will go have fasting lab work done and pick up medication

## 2023-12-23 NOTE — Addendum Note (Signed)
Addended by: Radene Knee L on: 12/23/2023 11:55 AM   Modules accepted: Orders

## 2023-12-29 DIAGNOSIS — K529 Noninfective gastroenteritis and colitis, unspecified: Secondary | ICD-10-CM | POA: Diagnosis not present

## 2023-12-31 LAB — GASTRIN: Gastrin: 267 pg/mL — ABNORMAL HIGH (ref 0–115)

## 2024-01-03 ENCOUNTER — Other Ambulatory Visit: Payer: Self-pay | Admitting: Internal Medicine

## 2024-01-03 ENCOUNTER — Telehealth: Payer: Self-pay

## 2024-01-03 DIAGNOSIS — K529 Noninfective gastroenteritis and colitis, unspecified: Secondary | ICD-10-CM

## 2024-01-03 DIAGNOSIS — K51411 Inflammatory polyps of colon with rectal bleeding: Secondary | ICD-10-CM

## 2024-01-03 NOTE — Telephone Encounter (Signed)
-----   Message from [facility] sent at 01/03/2024 11:27 AM EST ----- Please inform patient that the serum gastrin levels came back mildly elevated.  Gastrin is a type of hormone that increases acid production in the stomach.  She is currently taking omeprazole 20 mg twice daily.  Recommend to stop omeprazole at least for 2 weeks and recheck fasting serum gastrin levels.  She can take Pepcid 20 to 40 mg twice daily during this period  [employer]

## 2024-01-03 NOTE — Telephone Encounter (Signed)
 Patient verbalized understanding of results and instructions. Order the labs for 2 weeks

## 2024-01-12 ENCOUNTER — Telehealth: Payer: Self-pay

## 2024-01-12 ENCOUNTER — Other Ambulatory Visit: Payer: Self-pay

## 2024-01-12 DIAGNOSIS — E876 Hypokalemia: Secondary | ICD-10-CM

## 2024-01-12 NOTE — Telephone Encounter (Signed)
 Patient assistance medications- 2 boxes of [place]. Patient is aware to pick up the medication.

## 2024-01-14 ENCOUNTER — Other Ambulatory Visit (INDEPENDENT_AMBULATORY_CARE_PROVIDER_SITE_OTHER): Payer: PPO

## 2024-01-14 ENCOUNTER — Other Ambulatory Visit: Payer: Self-pay | Admitting: Internal Medicine

## 2024-01-14 DIAGNOSIS — E876 Hypokalemia: Secondary | ICD-10-CM | POA: Diagnosis not present

## 2024-01-14 DIAGNOSIS — G4733 Obstructive sleep apnea (adult) (pediatric): Secondary | ICD-10-CM | POA: Diagnosis not present

## 2024-01-14 LAB — BASIC METABOLIC PANEL
BUN: 15 mg/dL (ref 6–23)
CO2: 25 meq/L (ref 19–32)
Calcium: 8.4 mg/dL (ref 8.4–10.5)
Chloride: 108 meq/L (ref 96–112)
Creatinine, Ser: 0.96 mg/dL (ref 0.40–1.20)
GFR: 58.92 mL/min — ABNORMAL LOW (ref >60.00)
Glucose, Bld: 102 mg/dL — ABNORMAL HIGH (ref 70–99)
Potassium: 3.2 meq/L — ABNORMAL LOW (ref 3.5–5.1)
Sodium: 143 meq/L (ref 135–145)

## 2024-01-14 MED ORDER — POTASSIUM CHLORIDE CRYS ER 10 MEQ PO TBCR: EXTENDED_RELEASE_TABLET | ORAL | 0 refills | Status: DC

## 2024-01-14 NOTE — Telephone Encounter (Signed)
 Patient picked her patient assistance medications up.

## 2024-01-14 NOTE — Progress Notes (Signed)
 Rx sent in for kcl as directed. See lab result note.

## 2024-01-15 ENCOUNTER — Other Ambulatory Visit: Payer: Self-pay | Admitting: Internal Medicine

## 2024-01-15 DIAGNOSIS — E876 Hypokalemia: Secondary | ICD-10-CM

## 2024-01-15 NOTE — Progress Notes (Signed)
Order placed for f/u potassium check.  

## 2024-01-25 ENCOUNTER — Other Ambulatory Visit

## 2024-01-26 ENCOUNTER — Other Ambulatory Visit (INDEPENDENT_AMBULATORY_CARE_PROVIDER_SITE_OTHER)

## 2024-01-26 DIAGNOSIS — E876 Hypokalemia: Secondary | ICD-10-CM

## 2024-01-26 LAB — POTASSIUM: Potassium: 3.4 meq/L — ABNORMAL LOW (ref 3.5–5.1)

## 2024-01-27 ENCOUNTER — Other Ambulatory Visit: Payer: Self-pay

## 2024-01-27 MED ORDER — POTASSIUM CHLORIDE CRYS ER 10 MEQ PO TBCR: EXTENDED_RELEASE_TABLET | ORAL | 0 refills | Status: DC

## 2024-01-28 ENCOUNTER — Ambulatory Visit
Admission: RE | Admit: 2024-01-28 | Discharge: 2024-01-28 | Disposition: A | Discharge status: Home / Self Care | Payer: PPO | Source: Ambulatory Visit | Attending: Gastroenterology | Admitting: Gastroenterology

## 2024-01-28 DIAGNOSIS — K51411 Inflammatory polyps of colon with rectal bleeding: Secondary | ICD-10-CM | POA: Diagnosis not present

## 2024-01-28 MED ORDER — VEDOLIZUMAB 300 MG IV SOLR
300.0000 mg | Freq: Once | INTRAVENOUS | Status: AC
  Administered 2024-01-28: 300 mg via INTRAVENOUS
  Filled 2024-01-28: qty 5

## 2024-02-01 ENCOUNTER — Ambulatory Visit: Admitting: Family Medicine

## 2024-02-01 ENCOUNTER — Encounter: Payer: Self-pay | Admitting: Family Medicine

## 2024-02-01 ENCOUNTER — Ambulatory Visit: Payer: Self-pay

## 2024-02-01 VITALS — BP 126/68 | HR 64 | Temp 98.3°F | Resp 20 | Ht 64.0 in | Wt 139.2 lb

## 2024-02-01 DIAGNOSIS — R197 Diarrhea, unspecified: Secondary | ICD-10-CM | POA: Diagnosis not present

## 2024-02-01 DIAGNOSIS — E876 Hypokalemia: Secondary | ICD-10-CM

## 2024-02-01 LAB — CBC WITH DIFFERENTIAL/PLATELET
Basophils Absolute: 0 10*3/uL (ref 0.0–0.1)
Basophils Relative: 0.8 % (ref 0.0–3.0)
Eosinophils Absolute: 0.1 10*3/uL (ref 0.0–0.7)
Eosinophils Relative: 1.2 % (ref 0.0–5.0)
HCT: 39.2 % (ref 36.0–46.0)
Hemoglobin: 12.7 g/dL (ref 12.0–15.0)
Lymphocytes Relative: 37.4 % (ref 12.0–46.0)
Lymphs Abs: 2.2 10*3/uL (ref 0.7–4.0)
MCHC: 32.4 g/dL (ref 30.0–36.0)
MCV: 93.2 fl (ref 78.0–100.0)
Monocytes Absolute: 0.4 10*3/uL (ref 0.1–1.0)
Monocytes Relative: 6.2 % (ref 3.0–12.0)
Neutro Abs: 3.2 10*3/uL (ref 1.4–7.7)
Neutrophils Relative %: 54.4 % (ref 43.0–77.0)
Platelets: 227 10*3/uL (ref 150.0–400.0)
RBC: 4.21 Mil/uL (ref 3.87–5.11)
RDW: 14.7 % (ref 11.5–15.5)
WBC: 5.9 10*3/uL (ref 4.0–10.5)

## 2024-02-01 LAB — COMPREHENSIVE METABOLIC PANEL WITH GFR
ALT: 13 U/L (ref 0–35)
AST: 17 U/L (ref 0–37)
Albumin: 4 g/dL (ref 3.5–5.2)
Alkaline Phosphatase: 90 U/L (ref 39–117)
BUN: 13 mg/dL (ref 6–23)
CO2: 27 meq/L (ref 19–32)
Calcium: 8.7 mg/dL (ref 8.4–10.5)
Chloride: 107 meq/L (ref 96–112)
Creatinine, Ser: 0.93 mg/dL (ref 0.40–1.20)
GFR: 61.18 mL/min (ref >60.00)
Glucose, Bld: 82 mg/dL (ref 70–99)
Potassium: 3.6 meq/L (ref 3.5–5.1)
Sodium: 141 meq/L (ref 135–145)
Total Bilirubin: 0.4 mg/dL (ref 0.2–1.2)
Total Protein: 5.9 g/dL — ABNORMAL LOW (ref 6.0–8.3)

## 2024-02-01 LAB — MAGNESIUM: Magnesium: 1.9 mg/dL (ref 1.5–2.5)

## 2024-02-01 NOTE — Telephone Encounter (Signed)
 Pt is scheduled to see Dr. Clent Ridges today.

## 2024-02-01 NOTE — Patient Instructions (Addendum)
 It was a pleasure meeting you today. Thank you for allowing me to take part in your health care.  Our goals for today as we discussed include:  We will get some labs today.  If they are abnormal or we need to do something about them, I will call you.  If they are normal, I will send you a message on MyChart (if it is active) or a letter in the mail.  If you don't hear from [place] in 2 weeks, please call the office at the number below.   Stop potassium Increase potassium rich foods in diet if tolerated  If remains low today and if needing replacement will notify you    This is a list of the screening recommended for you and due dates:  Health Maintenance  Topic Date Due   Medicare Annual Wellness Visit  05/14/2023   Complete foot exam   10/17/2023   Zoster (Shingles) Vaccine (1 of 2) 03/15/2024*   COVID-19 Vaccine (6 - 2024-25 season) 12/02/2024*   Hepatitis C Screening  12/16/2024*   Yearly kidney health urinalysis for diabetes  04/29/2024   Eye exam for diabetics  05/03/2024   Flu Shot  06/02/2024   Hemoglobin A1C  06/13/2024   Yearly kidney function blood test for diabetes  01/13/2025   Mammogram  06/20/2025   Colon Cancer Screening  07/25/2031   Pneumonia Vaccine  Completed   DEXA scan (bone density measurement)  Completed   HPV Vaccine  Aged Out   DTaP/Tdap/Td vaccine  Discontinued  *Topic was postponed. The date shown is not the original due date.     If you have any questions or concerns, please do not hesitate to call the office at [phone].  I look forward to our next visit and until then take care and stay safe.  Regards,   Dana Allan, MD   [facility]

## 2024-02-01 NOTE — Progress Notes (Signed)
 SUBJECTIVE:   Chief Complaint  Patient presents with   Diarrhea    Worse then normal the last 3 days   HPI Presents for acute visit  Discussed the use of AI scribe software for clinical note transcription with the patient, who gave verbal consent to proceed.  History of Present Illness Ashley Gross "Ashley Gross" is a 73 year old female with chronic diarrhea who presents with worsening diarrhea.  She has a history of chronic diarrhea, typically experiencing daily episodes managed with prescription Imodium, taking two tablets (4 mg) three to four times a day. Over the past three days, she has experienced an increase in diarrhea frequency, with Imodium providing no relief. On Sunday, the diarrhea was so excessive that she was unable to attend church, and on Monday, she had to manage frequent episodes while at work. No recent changes in stool characteristics aside from the increased frequency. No new abdominal pain aside from cramping associated with frequent bowel movements. She supplements her prescription Imodium with over-the-counter Imodium as needed.  She has a history of low potassium levels and has been on potassium supplements. Recently, her potassium dosage was increased from one to two tablets daily due to a drop in her levels. She associates the increase in diarrhea with the higher potassium dose, as her diarrhea was less excessive when she was off potassium for a period four months ago.  She has a history of diabetes managed with insulin. She previously tried metformin and glipizide but was switched to insulin due to kidney function concerns. Her blood glucose levels are generally well-controlled, with occasional fluctuations based on dietary intake.  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 This condition contributes to her need for insulin therapy.  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 She  drinks zero sugar 7-Up to stay hydrated, as water sometimes feels heavy on her stomach. She has a small dog that she cares for, which is affected by her frequent bathroom trips.  No recent fever, new abdominal pain, or changes in stool characteristics aside from increased frequency. She reports chronic blood in her stool due to bleeding polyps.    PERTINENT PMH / PSH: As above  OBJECTIVE:  BP 126/68   Pulse 64   Temp 98.3 F (36.8 C)   Resp 20   Ht 5\' 4"  (1.626 m)   Wt 139 lb 4 oz (63.2 kg)   SpO2 99%   BMI 23.90 kg/m    Physical Exam Vitals reviewed.  Constitutional:      General: She is not in acute distress.    Appearance: She is not ill-appearing.  HENT:     Head: Normocephalic.     Right Ear: Tympanic membrane, ear canal and external ear normal.     Left Ear: Tympanic membrane, ear canal and external ear normal.     Nose: Nose normal.     Mouth/Throat:     Mouth: Mucous membranes are moist.  Eyes:     Extraocular Movements: Extraocular movements intact.     Conjunctiva/sclera: Conjunctivae normal.     Pupils: Pupils are equal, round, and reactive to light.  Neck:     Vascular: No carotid bruit.  Cardiovascular:     Rate and Rhythm: Normal rate and regular rhythm.     Pulses: Normal pulses.     Heart sounds: Normal heart sounds.  Pulmonary:     Effort: Pulmonary effort is normal.  Breath sounds: Normal breath sounds.  Abdominal:     General: Bowel sounds are normal. There is no distension.     Palpations: Abdomen is soft.     Tenderness: There is no abdominal tenderness. There is no right CVA tenderness, left CVA tenderness, guarding or rebound.  Musculoskeletal:        General: Normal range of motion.     Cervical back: Normal range of motion.     Right lower leg: No edema.     Left lower leg: No edema.  Lymphadenopathy:     Cervical: No cervical adenopathy.  Skin:    Capillary Refill: Capillary refill takes less than 2 seconds.  Neurological:      General: No focal deficit present.     Mental Status: She is alert and oriented to person, place, and time. Mental status is at baseline.     Motor: No weakness.  Psychiatric:        Mood and Affect: Mood normal.        Behavior: Behavior normal.        Thought Content: Thought content normal.        Judgment: Judgment normal.           02/01/2024   11:51 AM 12/03/2023    4:28 PM 09/10/2023    1:11 PM 07/19/2023    1:39 PM 05/07/2023    9:16 AM  Depression screen PHQ 2/9  Decreased Interest 0 1 1 0 0  Down, Depressed, Hopeless 0 1 2 0 0  PHQ - 2 Score 0 2 3 0 0  Altered sleeping 3 1 3  0 3  Tired, decreased energy 2  1 0 2  Change in appetite 1 1 0 0 1  Feeling bad or failure about yourself  0 1 0 0 0  Trouble concentrating 0 1 0 0 0  Moving slowly or fidgety/restless 0 0 0 0 0  Suicidal thoughts 0 0 0 0 0  PHQ-9 Score 6 6 7  0 6  Difficult doing work/chores Somewhat difficult Not difficult at all Not difficult at all Not difficult at all Not difficult at all      02/01/2024   11:51 AM 12/03/2023    4:28 PM 07/19/2023    1:39 PM 01/22/2022    3:10 PM  GAD 7 : Generalized Anxiety Score  Nervous, Anxious, on Edge 0 2 0 0  Control/stop worrying 0 2 0 0  Worry too much - different things 0 2 0 0  Trouble relaxing 2 2 0 0  Restless 0 1 0 0  Easily annoyed or irritable 0 0 0 0  Afraid - awful might happen 0 2 0 0  Total GAD 7 Score 2 11 0 0  Anxiety Difficulty Not difficult at all Not difficult at all Not difficult at all Not difficult at all    ASSESSMENT/PLAN:  Diarrhea, unspecified type Assessment & Plan: Chronic diarrhea managed with Imodium has worsened over the past three days, with increased frequency and severity. Current regimen of two tablets three to four times daily is ineffective. Diarrhea likely contributes to hypokalemia due to potassium loss. History of bleeding polyps may contribute to hematochezia. Potassium supplementation may exacerbate diarrhea unless  administered intravenously. Hemodynamically stable, no abdominal pain. - Check potassium and magnesium levels to assess current status. If improved can resume lower dose of potassium - Adjust potassium supplementation to balance diarrhea management and potassium levels.  Orders: -     Comprehensive metabolic panel  with GFR -     CBC with Differential/Platelet -     Magnesium  Hypokalemia Assessment & Plan: Persistent hypokalemia despite increased potassium supplementation to two tablets daily. Diarrhea exacerbation likely contributes to low potassium levels. Not on diuretics. Long-term insulin use without recent dosage changes. Losartan may counteract potassium-lowering effects of insulin and diarrhea. - Check potassium and magnesium levels to assess current status. - Follows with GI  Orders: -     Comprehensive metabolic panel with GFR -     Magnesium     PDMP reviewed  Return if symptoms worsen or fail to improve, for PCP.  Dana Allan, MD

## 2024-02-01 NOTE — Telephone Encounter (Signed)
 This RN made the first attempt to contact this patient. Patient did not answer, RN LVM with a callback number.

## 2024-02-01 NOTE — Telephone Encounter (Signed)
 Copied From CRM [id number]. Reason for Triage: Patient has been having diarrhea very frequently, taking potassium, thinks that may be the reason, wants to know if she should not take that or if there is something else she can take.    Chief Complaint: diarrha Symptoms: getting worse Frequency: constant Pertinent Negatives: Patient denies fever Disposition: [] ED /[] Urgent Care (no appt availability in office) / [x] Appointment(In office/virtual)/ []  [place] [facility]/ [] Home Care/ [] Refused Recommended Disposition /[] [place] [facility]/ []  Follow-up with PCP Additional Notes: Reports hx of chronic diarrhea, getting worse after increase of potassium  Reason for Disposition  [1] SEVERE diarrhea (e.g., 7 or more times / day more than normal) AND [2] present > 24 hours (1 day)  Answer Assessment - Initial Assessment Questions 1. DIARRHEA SEVERITY: "How bad is the diarrhea?" "How many more stools have you had in the past 24 hours than normal?"    - NO DIARRHEA (SCALE 0)   - MILD (SCALE 1-3): Few loose or mushy BMs; increase of 1-3 stools over normal daily number of stools; mild increase in ostomy output.   -  MODERATE (SCALE 4-7): Increase of 4-6 stools daily over normal; moderate increase in ostomy output.   -  SEVERE (SCALE 8-10; OR "WORST POSSIBLE"): Increase of 7 or more stools daily over normal; moderate increase in ostomy output; incontinence.     Severe, 10-12 episode  2. ONSET: "When did the diarrhea begin?"      Reports chronic diarrhea, but getting worse after increasing potassium  3. BM CONSISTENCY: "How loose or watery is the diarrhea?"      Very loose and sometimes watery  4. VOMITING: "Are you also vomiting?" If Yes, ask: "How many times in the past 24 hours?"      No  5. ABDOMEN PAIN: "Are you having any abdomen pain?" If Yes, ask: "What does it feel like?" (e.g., crampy, dull, intermittent, constant)      Cramping  6. ABDOMEN PAIN SEVERITY: If present, ask: "How bad  is the pain?"  (e.g., Scale 1-10; mild, moderate, or severe)   - MILD (1-3): doesn't interfere with normal activities, abdomen soft and not tender to touch    - MODERATE (4-7): interferes with normal activities or awakens from sleep, abdomen tender to touch    - SEVERE (8-10): excruciating pain, doubled over, unable to do any normal activities       Mild to moderate  7. ORAL INTAKE: If vomiting, "Have you been able to drink liquids?" "How much liquids have you had in the past 24 hours?"     Has been able to drink  8. HYDRATION: "Any signs of dehydration?" (e.g., dry mouth [not just dry lips], too weak to stand, dizziness, new weight loss) "When did you last urinate?"     Drinking okay, does not feel dehydrated  9. EXPOSURE: "Have you traveled to a foreign country recently?" "Have you been exposed to anyone with diarrhea?" "Could you have eaten any food that was spoiled?"     No  10. ANTIBIOTIC USE: "Are you taking antibiotics now or have you taken antibiotics in the past 2 months?"       No  11. OTHER SYMPTOMS: "Do you have any other symptoms?" (e.g., fever, blood in stool)       Blood in stools, reports bleeding polyps  12. PREGNANCY: "Is there any chance you are pregnant?" "When was your last menstrual period?"       No  Protocols used: [facility]

## 2024-02-06 ENCOUNTER — Encounter: Payer: Self-pay | Admitting: Family Medicine

## 2024-02-06 DIAGNOSIS — R197 Diarrhea, unspecified: Secondary | ICD-10-CM | POA: Insufficient documentation

## 2024-02-06 NOTE — Assessment & Plan Note (Signed)
 Persistent hypokalemia despite increased potassium supplementation to two tablets daily. Diarrhea exacerbation likely contributes to low potassium levels. Not on diuretics. Long-term insulin use without recent dosage changes. Losartan may counteract potassium-lowering effects of insulin and diarrhea. - Check potassium and magnesium levels to assess current status. - Follows with GI

## 2024-02-06 NOTE — Assessment & Plan Note (Addendum)
 Chronic diarrhea managed with Imodium has worsened over the past three days, with increased frequency and severity. Current regimen of two tablets three to four times daily is ineffective. Diarrhea likely contributes to hypokalemia due to potassium loss. History of bleeding polyps may contribute to hematochezia. Potassium supplementation may exacerbate diarrhea unless administered intravenously. Hemodynamically stable, no abdominal pain. - Check potassium and magnesium levels to assess current status. If improved can resume lower dose of potassium - Adjust potassium supplementation to balance diarrhea management and potassium levels.

## 2024-02-09 ENCOUNTER — Other Ambulatory Visit: Payer: Self-pay | Admitting: Internal Medicine

## 2024-02-14 ENCOUNTER — Encounter: Payer: Self-pay | Admitting: Internal Medicine

## 2024-02-14 ENCOUNTER — Ambulatory Visit (INDEPENDENT_AMBULATORY_CARE_PROVIDER_SITE_OTHER): Payer: PPO | Admitting: Internal Medicine

## 2024-02-14 VITALS — BP 124/70 | HR 75 | Temp 98.0°F | Resp 16 | Ht 64.0 in | Wt 140.4 lb

## 2024-02-14 DIAGNOSIS — I10 Essential (primary) hypertension: Secondary | ICD-10-CM | POA: Diagnosis not present

## 2024-02-14 DIAGNOSIS — Z794 Long term (current) use of insulin: Secondary | ICD-10-CM

## 2024-02-14 DIAGNOSIS — J4520 Mild intermittent asthma, uncomplicated: Secondary | ICD-10-CM | POA: Diagnosis not present

## 2024-02-14 DIAGNOSIS — E1165 Type 2 diabetes mellitus with hyperglycemia: Secondary | ICD-10-CM

## 2024-02-14 DIAGNOSIS — R197 Diarrhea, unspecified: Secondary | ICD-10-CM | POA: Diagnosis not present

## 2024-02-14 DIAGNOSIS — I729 Aneurysm of unspecified site: Secondary | ICD-10-CM

## 2024-02-14 DIAGNOSIS — D509 Iron deficiency anemia, unspecified: Secondary | ICD-10-CM

## 2024-02-14 DIAGNOSIS — G4733 Obstructive sleep apnea (adult) (pediatric): Secondary | ICD-10-CM

## 2024-02-14 DIAGNOSIS — D329 Benign neoplasm of meninges, unspecified: Secondary | ICD-10-CM

## 2024-02-14 DIAGNOSIS — F320 Major depressive disorder, single episode, mild: Secondary | ICD-10-CM | POA: Diagnosis not present

## 2024-02-14 DIAGNOSIS — L4050 Arthropathic psoriasis, unspecified: Secondary | ICD-10-CM | POA: Diagnosis not present

## 2024-02-14 DIAGNOSIS — K219 Gastro-esophageal reflux disease without esophagitis: Secondary | ICD-10-CM | POA: Diagnosis not present

## 2024-02-14 DIAGNOSIS — E876 Hypokalemia: Secondary | ICD-10-CM | POA: Diagnosis not present

## 2024-02-14 DIAGNOSIS — Z8673 Personal history of transient ischemic attack (TIA), and cerebral infarction without residual deficits: Secondary | ICD-10-CM

## 2024-02-14 DIAGNOSIS — E7800 Pure hypercholesterolemia, unspecified: Secondary | ICD-10-CM | POA: Diagnosis not present

## 2024-02-14 DIAGNOSIS — F439 Reaction to severe stress, unspecified: Secondary | ICD-10-CM

## 2024-02-14 DIAGNOSIS — K51419 Inflammatory polyps of colon with unspecified complications: Secondary | ICD-10-CM

## 2024-02-14 NOTE — Progress Notes (Signed)
 Subjective:    Patient ID: Ashley Gross, female    DOB: 02-24-1951, 73 y.o.   MRN: [id number]  Patient here for  Chief Complaint  Patient presents with   Medical Management of Chronic Issues    HPI Here for a scheduled follow up - f/u regarding diabetes, hypercholesterolemia and hypertension. Increased stress with family issues.  Stress is better. She is doing some better related to this. Seeing Dr Randy Buttery for IDA.  Last 12/21/23 - hgb and iron stable. Sees Dr Baldomero Bone - f/u inflammatory polyposis of the colon. Receiving entyvio  injections. Was instructed to change imodium  to lomotil . Is continued on creon . Did not feel imodium  worked any better than lamotil. Has f/u in May with Dr Baldomero Bone. Was evaluated recently for increased diarrhea. Was questioning if her potassium was contributing. Bowels are baseline now. No chest pain or sob reported.    Past Medical History:  Diagnosis Date   Abnormal liver function test 10/06/2012   Anemia 01/25/2022   Anemia, unspecified    Asthma    B12 deficiency 02/11/2019   Bleeding internal hemorrhoids 09/13/2017   Choledocholithiasis 06/29/2012   Formatting of this note might be different from the original. Dilated CBD on abdominal imaging 04/2012   Chronic diarrhea 02/16/2013   Colitis    Complication of anesthesia    asthma attack after shoulder surgery   Diabetes mellitus (HCC)    type II   Esophagitis    GERD (gastroesophageal reflux disease)    Heart murmur    for years- nothing to be concerned about.   History of kidney stones    Hx of arteriovenous malformation (AVM)    Hypertension    IBS (irritable bowel syndrome)    Loss of weight    Peripheral vascular disease (HCC)    Pernicious anemia    Personal history of colonic polyps 02/10/2013   02/08/13 colonoscopy - question of rectal varices, two 3-59mm polyps in the sigmoid colon, mass at the hepatic flexure, one 5mm polyp at the hepatic flexure, nodule at the ileocecal valve, congested mucusa  in the entire colon, flattened villi mucosa in the terminal ileum.     Pneumonia    Psoriatic arthritis (HCC)    s/p penicillamine, plaquenil, MTX, sulfasalazine.  s/p Embrel, Humira,.  Iritis.     Pure hypercholesterolemia    Rectal bleeding 07/15/2017   Stroke (HCC) 05/29/2022   per patient   Past Surgical History:  Procedure Laterality Date   ABDOMINAL HYSTERECTOMY  1981   ovaries left in place   ANKLE SURGERY     right   BUNIONECTOMY     CHOLECYSTECTOMY  1989   COLONOSCOPY WITH PROPOFOL  N/A 12/27/2017   Procedure: COLONOSCOPY WITH PROPOFOL ;  Surgeon: Selena Daily, MD;  Location: [facility] ENDOSCOPY;  Service: Gastroenterology;  Laterality: N/A;   COLONOSCOPY WITH PROPOFOL  N/A 01/04/2019   Procedure: COLONOSCOPY WITH PROPOFOL ;  Surgeon: Selena Daily, MD;  Location: [facility] SURGERY CNTR;  Service: Endoscopy;  Laterality: N/A;  Diabetic - oral and injectable   COLONOSCOPY WITH PROPOFOL  N/A 07/24/2021   Procedure: COLONOSCOPY WITH PROPOFOL ;  Surgeon: Selena Daily, MD;  Location: [facility] SURGERY CNTR;  Service: Endoscopy;  Laterality: N/A;  Diabetic   ESOPHAGOGASTRODUODENOSCOPY (EGD) WITH PROPOFOL  N/A 12/27/2017   Procedure: ESOPHAGOGASTRODUODENOSCOPY (EGD) WITH PROPOFOL ;  Surgeon: Selena Daily, MD;  Location: [facility] ENDOSCOPY;  Service: Gastroenterology;  Laterality: N/A;   HAND SURGERY     right   HEEL SPUR SURGERY  IR 3D INDEPENDENT WKST  10/01/2021   IR ANGIO INTRA EXTRACRAN SEL COM CAROTID INNOMINATE UNI L MOD SED  10/01/2021   IR ANGIO INTRA EXTRACRAN SEL INTERNAL CAROTID UNI R MOD SED  10/01/2021   IR ANGIO INTRA EXTRACRAN SEL INTERNAL CAROTID UNI R MOD SED  05/29/2022   IR ANGIO VERTEBRAL SEL VERTEBRAL BILAT MOD SED  10/01/2021   IR ANGIO VERTEBRAL SEL VERTEBRAL UNI R MOD SED  05/29/2022   IR CT HEAD LTD  10/01/2021   IR TRANSCATH/EMBOLIZ  10/01/2021   IR US  GUIDE VASC ACCESS RIGHT  10/01/2021   IR US  GUIDE VASC ACCESS RIGHT  05/29/2022   NOSE SURGERY      x2   POLYPECTOMY  01/04/2019   Procedure: POLYPECTOMY;  Surgeon: Selena Daily, MD;  Location: [facility] SURGERY CNTR;  Service: Endoscopy;;   POLYPECTOMY N/A 07/24/2021   Procedure: POLYPECTOMY;  Surgeon: Selena Daily, MD;  Location: [facility] SURGERY CNTR;  Service: Endoscopy;  Laterality: N/A;   RADIOLOGY WITH ANESTHESIA N/A 10/01/2021   Procedure: Vania Genin WITH ANESTHESIA;  Surgeon: de Macedo Rodrigues, Katyucia, MD;  Location: [facility] OR;  Service: Radiology;  Laterality: N/A;   SHOULDER ARTHROSCOPY WITH OPEN ROTATOR CUFF REPAIR Left 09/20/2018   Procedure: SHOULDER ARTHROSCOPY WITH OPEN ROTATOR CUFF REPAIR;  Surgeon: Elner Hahn, MD;  Location: [facility] ORS;  Service: Orthopedics;  Laterality: Left;   SHOULDER CLOSED REDUCTION Left 12/21/2018   Procedure: MANIPULATION UNDER ANESTHESIA WITH STEROID INJECTION;  Surgeon: Elner Hahn, MD;  Location: [facility] SURGERY CNTR;  Service: Orthopedics;  Laterality: Left;  Diabetic - insulin  and oral meds   UMBILICAL HERNIA REPAIR     XI ROBOTIC ASSISTED INGUINAL HERNIA REPAIR WITH MESH Left 12/16/2021   Procedure: XI ROBOTIC ASSISTED INGUINAL HERNIA REPAIR WITH MESH;  Surgeon: [name] Hare, MD;  Location: [facility] ORS;  Service: General;  Laterality: Left;   Family History  Problem Relation Age of Onset   Diabetes Other    Hypertension Other    Breast cancer Neg Hx    Colon cancer Neg Hx    Social History   Socioeconomic History   Marital status: Widowed    Spouse name: Not on file   Number of children: 2   Years of education: Not on file   Highest education level: Associate degree: occupational, Scientist, product/process development, or vocational program  Occupational History   Not on file  Tobacco Use   Smoking status: Never    Passive exposure: Never   Smokeless tobacco: Never  Vaping Use   Vaping status: Never Used  Substance and Sexual Activity   Alcohol use: Never   Drug use: Never   Sexual activity: Not Currently  Other Topics Concern   Not on file   Social History Narrative   She is married and has two children   Grand daughter lives with her.   Social Drivers of Health   Financial Resource Strain: Medium Risk (02/10/2024)   Overall Financial Resource Strain (CARDIA)    Difficulty of Paying Living Expenses: Somewhat hard  Food Insecurity: No Food Insecurity (02/10/2024)   Hunger Vital Sign    Worried About Running Out of Food in the Last Year: Never true    Ran Out of Food in the Last Year: Never true  Transportation Needs: No Transportation Needs (02/10/2024)   PRAPARE - Administrator, Civil Service (Medical): No    Lack of Transportation (Non-Medical): No  Physical Activity: Unknown (02/10/2024)   Exercise Vital Sign  Days of Exercise per Week: 0 days    Minutes of Exercise per Session: Not on file  Stress: Stress Concern Present (02/10/2024)   [employer] of Occupational Health - Occupational Stress Questionnaire    Feeling of Stress : To some extent  Social Connections: Moderately Integrated (02/10/2024)   Social Connection and Isolation Panel [NHANES]    Frequency of Communication with Friends and Family: More than three times a week    Frequency of Social Gatherings with Friends and Family: Once a week    Attends Religious Services: More than 4 times per year    Active Member of [employer] or Organizations: Yes    Attends Banker Meetings: More than 4 times per year    Marital Status: Widowed     Review of Systems  Constitutional:  Negative for appetite change and unexpected weight change.  HENT:  Negative for congestion and sinus pressure.   Respiratory:  Negative for cough, chest tightness and shortness of breath.   Cardiovascular:  Negative for chest pain, palpitations and leg swelling.  Gastrointestinal:  Positive for diarrhea. Negative for abdominal pain, nausea and vomiting.  Genitourinary:  Negative for difficulty urinating and dysuria.  Musculoskeletal:  Negative for joint swelling  and myalgias.  Skin:  Negative for color change and rash.  Neurological:  Negative for dizziness and headaches.  Psychiatric/Behavioral:  Negative for agitation and dysphoric mood.        Objective:     BP 124/70   Pulse 75   Temp 98 F (36.7 C)   Resp 16   Ht 5\' 4"  (1.626 m)   Wt 140 lb 6.4 oz (63.7 kg)   SpO2 98%   BMI 24.10 kg/m  Wt Readings from Last 3 Encounters:  02/14/24 140 lb 6.4 oz (63.7 kg)  02/01/24 139 lb 4 oz (63.2 kg)  12/21/23 142 lb 6.4 oz (64.6 kg)    Physical Exam Vitals reviewed.  Constitutional:      General: She is not in acute distress.    Appearance: Normal appearance.  HENT:     Head: Normocephalic and atraumatic.     Right Ear: External ear normal.     Left Ear: External ear normal.     Mouth/Throat:     Pharynx: No oropharyngeal exudate or posterior oropharyngeal erythema.  Eyes:     General: No scleral icterus.       Right eye: No discharge.        Left eye: No discharge.     Conjunctiva/sclera: Conjunctivae normal.  Neck:     Thyroid : No thyromegaly.  Cardiovascular:     Rate and Rhythm: Normal rate and regular rhythm.  Pulmonary:     Effort: No respiratory distress.     Breath sounds: Normal breath sounds. No wheezing.  Abdominal:     General: Bowel sounds are normal.     Palpations: Abdomen is soft.     Tenderness: There is no abdominal tenderness.  Musculoskeletal:        General: No swelling or tenderness.     Cervical back: Neck supple. No tenderness.  Lymphadenopathy:     Cervical: No cervical adenopathy.  Skin:    Findings: No erythema or rash.  Neurological:     Mental Status: She is alert.  Psychiatric:        Mood and Affect: Mood normal.        Behavior: Behavior normal.         Outpatient Encounter Medications as  of 02/14/2024  Medication Sig   acetaminophen  (TYLENOL ) 500 MG tablet Take 2 tablets (1,000 mg total) by mouth every 6 (six) hours as needed for mild pain.   albuterol  (VENTOLIN  HFA) 108 (90  Base) MCG/ACT inhaler Inhale 2 puffs into the lungs every 6 (six) hours as needed for wheezing or shortness of breath.   amLODipine  (NORVASC ) 5 MG tablet TAKE 1 TABLET(5 MG) BY MOUTH TWICE DAILY   Azelastine  HCl 137 MCG/SPRAY SOLN PLACE 2 SPRAYS INTO BOTH NOSTRILS 2 (TWO) TIMES DAILY. USE IN EACH NOSTRIL AS DIRECTED   cetirizine (ZYRTEC) 10 MG tablet Take 10 mg by mouth daily.   Cholecalciferol  (VITAMIN D ) 50 MCG (2000 UT) tablet Take 2,000 Units by mouth daily.   CREON  36000-114000 units CPEP capsule Take 1-2 capsules (36,000-72,000 Units total) by mouth See admin instructions. Take 720000 units with each meal and 36000 units with snacks   Eszopiclone  3 MG TABS Take 1 tablet (3 mg total) by mouth at bedtime. Take immediately before bedtime   insulin  degludec (TRESIBA  FLEXTOUCH) 100 UNIT/ML FlexTouch Pen Inject 16 Units into the skin daily. (Patient taking differently: Inject 14 Units into the skin daily.)   loperamide  (IMODIUM ) 2 MG capsule Take 4 mg by mouth daily.   losartan  (COZAAR ) 100 MG tablet TAKE 1 TABLET(100 MG) BY MOUTH AT BEDTIME   mometasone  (ELOCON ) 0.1 % ointment Apply 1 Application topically 2 (two) times daily.   omeprazole  (PRILOSEC) 20 MG capsule TAKE 1 CAPSULE(20 MG) BY MOUTH TWICE DAILY   ONE TOUCH ULTRA TEST test strip PATIENT NEEDS NEW METER STRIPS AND LANCETS FOR ONE TOUCH. HER INSURANCE NO LONGER COVERS ACCUCHEK.   potassium chloride  (KLOR-CON  M) 10 MEQ tablet Take 1 tablet  po q day. (Patient taking differently: 10 mEq daily. Take 1 tablet  po q day.)   rosuvastatin  (CRESTOR ) 10 MG tablet TAKE 1 TABLET(10 MG) BY MOUTH DAILY   vedolizumab  (ENTYVIO ) 300 MG injection Inject 300 mg into the vein 4 (four) times a week.   WIXELA INHUB 100-50 MCG/ACT AEPB INHALE 1 PUFF INTO THE LUNGS TWICE A DAY   No facility-administered encounter medications on file as of 02/14/2024.     Lab Results  Component Value Date   WBC 5.9 02/01/2024   HGB 12.7 02/01/2024   HCT 39.2 02/01/2024    PLT 227.0 02/01/2024   GLUCOSE 82 02/01/2024   CHOL 157 12/15/2023   TRIG 173.0 (H) 12/15/2023   HDL 56.20 12/15/2023   LDLDIRECT 74.0 04/19/2019   LDLCALC 66 12/15/2023   ALT 13 02/01/2024   AST 17 02/01/2024   NA 141 02/01/2024   K 3.6 02/01/2024   CL 107 02/01/2024   CREATININE 0.93 02/01/2024   BUN 13 02/01/2024   CO2 27 02/01/2024   TSH 3.28 04/30/2023   INR 1.1 02/19/2023   HGBA1C 7.6 (H) 12/15/2023   MICROALBUR 6.6 (H) 04/30/2023       Assessment & Plan:  Iron deficiency anemia, unspecified iron deficiency anemia type Assessment & Plan: Seeing GI.  entyvio  treatments for inflammatory polyposis.  Seeing hematology - iron infusions.  Follow cbc.    Hypercholesterolemia Assessment & Plan: Continue crestor . Follow lipid panel and liver function tests.    Orders: -     Lipid panel; Future -     Hepatic function panel; Future -     Basic metabolic panel with GFR; Future -     TSH; Future  Type 2 diabetes mellitus with hyperglycemia, with long-term current use of  insulin  [facility]) Assessment & Plan: Continues on tresiba . Follow met b and A1c.  Follow sugars.  Need readings to review - adjustment in medication.  Lab Results  Component Value Date   HGBA1C 7.6 (H) 12/15/2023     Orders: -     Hemoglobin A1c; Future  Aneurysm [facility]) Assessment & Plan: Saw Dr Mont Antis [facility] - 01/2023 - At this point, she is asymptomatic. He recommended follow-up imaging. F/u MrI 05/2023 stable.  Recommended f/u MRI scan of the brain as well as MR angiogram of the brain in one year.    Mild intermittent asthma without complication Assessment & Plan: Breathing stable.  No increased cough and congestion.  Continue wixela.  Has duonebs if needed.    Depression, major, single episode, mild (HCC) Assessment & Plan: Stress is better. Overall doing better. Feels better. Follow.    Diarrhea, unspecified type Assessment & Plan: Improved from recent evaluation. Still has persistent loose  stools.  Followed by GI as outlined. Continues imodium .    Gastroesophageal reflux disease, unspecified whether esophagitis present Assessment & Plan: Continues omeprazole . No upper symptoms reported.    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  MRA head and neck were negative.  ECHO - no evidence of embolic source. With history of significant GI bleed in the past due to AVM, polyp bleed.     Primary hypertension Assessment & Plan: On amlodipine  and losartan .  Follow pressures.  Follow metabolic panel. No changes today.    Hypokalemia Assessment & Plan: Recent check wnl. Continue potassium supplements.    Pseudopolyposis of colon with complication, unspecified part of colon [facility]) Assessment & Plan: S/p colonoscopy 07/24/21 as outlined.  Continues f/u with GI.  On entyvio . Keep f/u with GI.   Meningioma [facility]) Assessment & Plan: Evaluated by [facility].    Psoriatic arthritis (HCC) Assessment & Plan: Stable.  Has been followed by rheumatology.     Severe obstructive sleep apnea Assessment & Plan: Needs to continue cpap. Seeing pulmonary.    Stress Assessment & Plan: Stress is better. Doing better. Follow.       Dellar Fenton, MD

## 2024-02-19 ENCOUNTER — Encounter: Payer: Self-pay | Admitting: Internal Medicine

## 2024-02-19 NOTE — Assessment & Plan Note (Signed)
 Evaluated by [facility].

## 2024-02-19 NOTE — Assessment & Plan Note (Signed)
 Improved from recent evaluation. Still has persistent loose stools.  Followed by GI as outlined. Continues imodium .

## 2024-02-19 NOTE — Assessment & Plan Note (Signed)
 Needs to continue cpap. Seeing pulmonary.

## 2024-02-19 NOTE — Assessment & Plan Note (Signed)
 Recent check wnl. Continue potassium supplements.

## 2024-02-19 NOTE — Assessment & Plan Note (Signed)
S/p colonoscopy 07/24/21 as outlined.  Continues f/u with GI.  On entyvio. Keep f/u with GI.  

## 2024-02-19 NOTE — Assessment & Plan Note (Signed)
 Continue crestor. Follow lipid panel and liver function tests.

## 2024-02-19 NOTE — Assessment & Plan Note (Signed)
 Saw Dr Mont Antis [facility] - 01/2023 - At this point, she is asymptomatic. He recommended follow-up imaging. F/u MrI 05/2023 stable.  Recommended f/u MRI scan of the brain as well as MR angiogram of the brain in one year.

## 2024-02-19 NOTE — Assessment & Plan Note (Signed)
Stable.  Has been followed by rheumatology.   

## 2024-02-19 NOTE — Assessment & Plan Note (Signed)
 On amlodipine  and losartan .  Follow pressures.  Follow metabolic panel. No changes today.

## 2024-02-19 NOTE — Assessment & Plan Note (Signed)
 Stress is better. Overall doing better. Feels better. Follow.

## 2024-02-19 NOTE — Assessment & Plan Note (Signed)
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  MRA head and neck were negative.  ECHO - no evidence of embolic source. With history of significant GI bleed in the past due to AVM, polyp bleed.   

## 2024-02-19 NOTE — Assessment & Plan Note (Signed)
 Breathing stable.  No increased cough and congestion.  Continue wixela.  Has duonebs if needed.

## 2024-02-19 NOTE — Assessment & Plan Note (Signed)
 Continues omeprazole . No upper symptoms reported.

## 2024-02-19 NOTE — Assessment & Plan Note (Signed)
Stress is better.  Doing better.  Follow.

## 2024-02-19 NOTE — Assessment & Plan Note (Addendum)
 Continues on tresiba . Follow met b and A1c.  Follow sugars.  Need readings to review - adjustment in medication.  Lab Results  Component Value Date   HGBA1C 7.6 (H) 12/15/2023

## 2024-02-19 NOTE — Assessment & Plan Note (Signed)
 Seeing GI.  entyvio  treatments for inflammatory polyposis.  Seeing hematology - iron infusions.  Follow cbc.

## 2024-03-10 ENCOUNTER — Ambulatory Visit
Admission: RE | Admit: 2024-03-10 | Discharge: 2024-03-10 | Disposition: A | Discharge status: Home / Self Care | Source: Ambulatory Visit | Attending: Gastroenterology

## 2024-03-10 DIAGNOSIS — K51411 Inflammatory polyps of colon with rectal bleeding: Secondary | ICD-10-CM | POA: Diagnosis not present

## 2024-03-10 MED ORDER — VEDOLIZUMAB 300 MG IV SOLR
300.0000 mg | Freq: Once | INTRAVENOUS | Status: AC
  Administered 2024-03-10: 300 mg via INTRAVENOUS
  Filled 2024-03-10: qty 5

## 2024-03-12 ENCOUNTER — Other Ambulatory Visit: Payer: Self-pay | Admitting: Internal Medicine

## 2024-03-13 NOTE — Telephone Encounter (Signed)
 Refilled: 01/27/2024 Last OV: 02/14/2024 Next OV: 04/07/2024 Last Potassium level: 3.6 on 02/01/2024

## 2024-03-14 ENCOUNTER — Ambulatory Visit
Admission: EM | Admit: 2024-03-14 | Discharge: 2024-03-14 | Disposition: A | Discharge status: Home / Self Care | Attending: Emergency Medicine | Admitting: Emergency Medicine

## 2024-03-14 ENCOUNTER — Ambulatory Visit: Payer: Self-pay

## 2024-03-14 DIAGNOSIS — J4521 Mild intermittent asthma with (acute) exacerbation: Secondary | ICD-10-CM

## 2024-03-14 LAB — POC COVID19/FLU A&B COMBO
Covid Antigen, POC: NEGATIVE
Influenza A Antigen, POC: NEGATIVE
Influenza B Antigen, POC: NEGATIVE

## 2024-03-14 MED ORDER — AZITHROMYCIN 250 MG PO TABS: 250.0000 mg | ORAL_TABLET | Freq: Every day | ORAL | 0 refills | Status: DC

## 2024-03-14 MED ORDER — PREDNISONE 10 MG PO TABS: 40.0000 mg | ORAL_TABLET | Freq: Every day | ORAL | 0 refills | Status: AC

## 2024-03-14 NOTE — ED Provider Notes (Signed)
 Arlander Bellman    CSN: [id number] Arrival date & time: 03/14/24  1128      History   Chief Complaint Chief Complaint  Patient presents with   Cough   Fever    HPI Ashley Gross is a 73 y.o. female.  Patient presents with 4-day history of fever, headache, congestion, cough, tightness with breathing.  Tmax 102.3.  No shortness of breath or chest pain.  She used her albuterol  nebulizer earlier this morning and has been taking Tylenol .  Her medical history includes asthma.  The history is provided by the patient and medical records.    Past Medical History:  Diagnosis Date   Abnormal liver function test 10/06/2012   Anemia 01/25/2022   Anemia, unspecified    Asthma    B12 deficiency 02/11/2019   Bleeding internal hemorrhoids 09/13/2017   Choledocholithiasis 06/29/2012   Formatting of this note might be different from the original. Dilated CBD on abdominal imaging 04/2012   Chronic diarrhea 02/16/2013   Colitis    Complication of anesthesia    asthma attack after shoulder surgery   Diabetes mellitus (HCC)    type II   Esophagitis    GERD (gastroesophageal reflux disease)    Heart murmur    for years- nothing to be concerned about.   History of kidney stones    Hx of arteriovenous malformation (AVM)    Hypertension    IBS (irritable bowel syndrome)    Loss of weight    Peripheral vascular disease (HCC)    Pernicious anemia    Personal history of colonic polyps 02/10/2013   02/08/13 colonoscopy - question of rectal varices, two 3-67mm polyps in the sigmoid colon, mass at the hepatic flexure, one 5mm polyp at the hepatic flexure, nodule at the ileocecal valve, congested mucusa in the entire colon, flattened villi mucosa in the terminal ileum.     Pneumonia    Psoriatic arthritis (HCC)    s/p penicillamine, plaquenil, MTX, sulfasalazine.  s/p Embrel, Humira,.  Iritis.     Pure hypercholesterolemia    Rectal bleeding 07/15/2017   Stroke (HCC) 05/29/2022   per  patient    Patient Active Problem List   Diagnosis Date Noted   Diarrhea 02/06/2024   Hypokalemia 12/18/2023   Fever 12/03/2023   Sore throat 12/03/2023   Murmur 09/12/2023   Back pain 09/12/2023   Dizziness 07/19/2023   Allergic rhinitis 07/19/2023   Iron deficiency anemia 06/07/2023   Severe obstructive sleep apnea 05/10/2023   Insomnia 05/10/2023   Daytime somnolence 03/30/2023   Perianal excoriation 02/26/2023   Asthma exacerbation 02/20/2023   Sepsis (HCC) 02/18/2023   Acute cough 02/15/2023   Urinary frequency 12/28/2022   Aneurysm (HCC) 11/28/2022   Type 2 diabetes mellitus with diabetic nephropathy, without long-term current use of insulin  (HCC) 11/28/2022   History of CVA (cerebrovascular accident) 05/30/2022   Anemia 01/25/2022   Left inguinal hernia    Meningioma (HCC) 11/05/2021   Inguinal hernia 10/31/2021   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HCC) 10/31/2021   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   Ear fullness 05/25/2021   Skin lesion 03/15/2021   Rib pain on left side 08/05/2020   Sleep difficulties 06/08/2020   Swelling of lower extremity 11/26/2019   Healthcare maintenance 09/13/2019   Asthma 12/28/2018   Vitamin D  deficiency 11/01/2018   Tendinitis of upper biceps tendon of left shoulder 09/20/2018   Depression, major, single episode, mild (HCC) 09/15/2018   Mild  intermittent asthma without complication 09/15/2018   Type 2 diabetes mellitus with hyperglycemia (HCC) 09/15/2018   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   Traumatic incomplete tear of left rotator cuff 09/09/2018   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   Low back pain 03/29/2016   Stress 12/12/2014   Inflammatory polyps of colon (HCC) 02/16/2013   History of colonic polyps 02/10/2013   Hypertension 10/06/2012   GERD (gastroesophageal reflux disease) 10/06/2012   Hypercholesterolemia 10/06/2012   Elevated alkaline phosphatase level 06/29/2012    Psoriatic arthritis (HCC) 06/29/2012    Past Surgical History:  Procedure Laterality Date   ABDOMINAL HYSTERECTOMY  1981   ovaries left in place   ANKLE SURGERY     right   BUNIONECTOMY     CHOLECYSTECTOMY  1989   COLONOSCOPY WITH PROPOFOL  N/A 12/27/2017   Procedure: COLONOSCOPY WITH PROPOFOL ;  Surgeon: Selena Daily, MD;  Location: [facility] ENDOSCOPY;  Service: Gastroenterology;  Laterality: N/A;   COLONOSCOPY WITH PROPOFOL  N/A 01/04/2019   Procedure: COLONOSCOPY WITH PROPOFOL ;  Surgeon: Selena Daily, MD;  Location: [facility];  Service: Endoscopy;  Laterality: N/A;  Diabetic - oral and injectable   COLONOSCOPY WITH PROPOFOL  N/A 07/24/2021   Procedure: COLONOSCOPY WITH PROPOFOL ;  Surgeon: Selena Daily, MD;  Location: [facility];  Service: Endoscopy;  Laterality: N/A;  Diabetic   ESOPHAGOGASTRODUODENOSCOPY (EGD) WITH PROPOFOL  N/A 12/27/2017   Procedure: ESOPHAGOGASTRODUODENOSCOPY (EGD) WITH PROPOFOL ;  Surgeon: Selena Daily, MD;  Location: [facility] ENDOSCOPY;  Service: Gastroenterology;  Laterality: N/A;   HAND SURGERY     right   HEEL SPUR SURGERY     IR 3D INDEPENDENT WKST  10/01/2021   IR ANGIO INTRA EXTRACRAN SEL COM CAROTID INNOMINATE UNI L MOD SED  10/01/2021   IR ANGIO INTRA EXTRACRAN SEL INTERNAL CAROTID UNI R MOD SED  10/01/2021   IR ANGIO INTRA EXTRACRAN SEL INTERNAL CAROTID UNI R MOD SED  05/29/2022   IR ANGIO VERTEBRAL SEL VERTEBRAL BILAT MOD SED  10/01/2021   IR ANGIO VERTEBRAL SEL VERTEBRAL UNI R MOD SED  05/29/2022   IR CT HEAD LTD  10/01/2021   IR TRANSCATH/EMBOLIZ  10/01/2021   IR US  GUIDE VASC ACCESS RIGHT  10/01/2021   IR US  GUIDE VASC ACCESS RIGHT  05/29/2022   NOSE SURGERY     x2   POLYPECTOMY  01/04/2019   Procedure: POLYPECTOMY;  Surgeon: Selena Daily, MD;  Location: [facility] SURGERY [facility];  Service: Endoscopy;;   POLYPECTOMY N/A 07/24/2021   Procedure: POLYPECTOMY;  Surgeon: Selena Daily, MD;  Location: [facility]  [facility];  Service: Endoscopy;  Laterality: N/A;   RADIOLOGY WITH ANESTHESIA N/A 10/01/2021   Procedure: Vania Genin WITH ANESTHESIA;  Surgeon: de Macedo Rodrigues, Katyucia, MD;  Location: [facility] OR;  Service: Radiology;  Laterality: N/A;   SHOULDER ARTHROSCOPY WITH OPEN ROTATOR CUFF REPAIR Left 09/20/2018   Procedure: SHOULDER ARTHROSCOPY WITH OPEN ROTATOR CUFF REPAIR;  Surgeon: Elner Hahn, MD;  Location: [facility] ORS;  Service: Orthopedics;  Laterality: Left;   SHOULDER CLOSED REDUCTION Left 12/21/2018   Procedure: MANIPULATION UNDER ANESTHESIA WITH STEROID INJECTION;  Surgeon: Elner Hahn, MD;  Location: [facility];  Service: Orthopedics;  Laterality: Left;  Diabetic - insulin  and oral meds   UMBILICAL HERNIA REPAIR     XI ROBOTIC ASSISTED INGUINAL HERNIA REPAIR WITH MESH Left 12/16/2021   Procedure: XI ROBOTIC ASSISTED INGUINAL HERNIA REPAIR WITH MESH;  Surgeon: [name] Hare, MD;  Location: [facility] ORS;  Service: General;  Laterality: Left;    OB History   No obstetric history on file.      Home Medications    Prior to Admission medications   Medication Sig Start Date End Date Taking? Authorizing Provider  azithromycin  (ZITHROMAX ) 250 MG tablet Take 1 tablet (250 mg total) by mouth daily. Take first 2 tablets together, then 1 every day until finished. 03/14/24  Yes Wellington Half, NP  predniSONE  (DELTASONE ) 10 MG tablet Take 4 tablets (40 mg total) by mouth daily for 5 days. 03/14/24 03/19/24 Yes Wellington Half, NP  acetaminophen  (TYLENOL ) 500 MG tablet Take 2 tablets (1,000 mg total) by mouth every 6 (six) hours as needed for mild pain. 12/16/21   [name] Hare, MD  albuterol  (VENTOLIN  HFA) 108 (90 Base) MCG/ACT inhaler Inhale 2 puffs into the lungs every 6 (six) hours as needed for wheezing or shortness of breath.    [provider]  amLODipine  (NORVASC ) 5 MG tablet TAKE 1 TABLET(5 MG) BY MOUTH TWICE DAILY 09/10/23   Dellar Fenton, MD  Azelastine  HCl 137 MCG/SPRAY SOLN PLACE 2  SPRAYS INTO BOTH NOSTRILS 2 (TWO) TIMES DAILY. USE IN EACH NOSTRIL AS DIRECTED 11/22/23   Kent Pear, MD  cetirizine (ZYRTEC) 10 MG tablet Take 10 mg by mouth daily.    [provider]  Cholecalciferol  (VITAMIN D ) 50 MCG (2000 UT) tablet Take 2,000 Units by mouth daily.    [provider]  CREON  36000-114000 units CPEP capsule Take 1-2 capsules (36,000-72,000 Units total) by mouth See admin instructions. Take 720000 units with each meal and 36000 units with snacks 07/15/22   Vanga, Rohini Reddy, MD  Eszopiclone  3 MG TABS Take 1 tablet (3 mg total) by mouth at bedtime. Take immediately before bedtime 12/20/23   Cobb, Katherine V, NP  insulin  degludec (TRESIBA  FLEXTOUCH) 100 UNIT/ML FlexTouch Pen Inject 16 Units into the skin daily. Patient taking differently: Inject 14 Units into the skin daily. 04/05/23   Dellar Fenton, MD  loperamide  (IMODIUM ) 2 MG capsule Take 4 mg by mouth daily.    [provider]  losartan  (COZAAR ) 100 MG tablet TAKE 1 TABLET(100 MG) BY MOUTH AT BEDTIME 01/03/24   Dellar Fenton, MD  mometasone  (ELOCON ) 0.1 % ointment Apply 1 Application topically 2 (two) times daily. 03/16/23   [provider]  omeprazole  (PRILOSEC) 20 MG capsule TAKE 1 CAPSULE(20 MG) BY MOUTH TWICE DAILY 03/15/23   Dellar Fenton, MD  ONE TOUCH ULTRA TEST test strip PATIENT NEEDS NEW METER STRIPS AND LANCETS FOR ONE TOUCH. HER INSURANCE NO LONGER COVERS ACCUCHEK. 07/31/15   Dellar Fenton, MD  potassium chloride  (KLOR-CON  M) 10 MEQ tablet TAKE 1 TABLET BY MOUTH EVERY DAY 03/14/24   Dellar Fenton, MD  rosuvastatin  (CRESTOR ) 10 MG tablet TAKE 1 TABLET(10 MG) BY MOUTH DAILY 04/23/23   Dellar Fenton, MD  vedolizumab  (ENTYVIO ) 300 MG injection Inject 300 mg into the vein 4 (four) times a week. 12/23/23   Selena Daily, MD  Zachery Hermes INHUB 100-50 MCG/ACT AEPB INHALE 1 PUFF INTO THE LUNGS TWICE A DAY 08/25/23   Dellar Fenton, MD    Family History Family History   Problem Relation Age of Onset   Diabetes Other    Hypertension Other    Breast cancer Neg Hx    Colon cancer Neg Hx     Social History Social History   Tobacco Use   Smoking status: Never    Passive exposure: Never   Smokeless tobacco: Never  Vaping  Use   Vaping status: Never Used  Substance Use Topics   Alcohol use: Never   Drug use: Never     Allergies   Aspirin , Beta adrenergic blockers, Lisinopril , Mtx support [cobalamine combinations], Nsaids, Vasotec [enalapril], Verapamil , Voltaren [diclofenac sodium], Erythromycin , Indocin [indomethacin], and Jardiance  [empagliflozin ]   Review of Systems Review of Systems  Constitutional:  Positive for fever. Negative for chills.  HENT:  Positive for congestion. Negative for ear pain and sore throat.   Respiratory:  Positive for cough and chest tightness. Negative for shortness of breath and wheezing.   Cardiovascular:  Negative for chest pain and palpitations.  Neurological:  Positive for headaches.     Physical Exam Triage Vital Signs ED Triage Vitals  Encounter Vitals Group     BP 03/14/24 1158 (!) 141/82     Systolic BP Percentile --      Diastolic BP Percentile --      Pulse Rate 03/14/24 1158 65     Resp 03/14/24 1158 20     Temp 03/14/24 1158 98.6 F (37 C)     Temp src --      SpO2 03/14/24 1158 98 %     Weight --      Height --      Head Circumference --      Peak Flow --      Pain Score 03/14/24 1151 0     Pain Loc --      Pain Education --      Exclude from Growth Chart --    No data found.  Updated Vital Signs BP (!) 141/82   Pulse 65   Temp 98.6 F (37 C)   Resp 20   SpO2 98%   Visual Acuity Right Eye Distance:   Left Eye Distance:   Bilateral Distance:    Right Eye Near:   Left Eye Near:    Bilateral Near:     Physical Exam Constitutional:      General: She is not in acute distress. HENT:     Right Ear: Tympanic membrane normal.     Left Ear: Tympanic membrane normal.     Nose:  Nose normal.     Mouth/Throat:     Mouth: Mucous membranes are moist.     Pharynx: Oropharynx is clear.  Cardiovascular:     Rate and Rhythm: Normal rate and regular rhythm.     Heart sounds: Normal heart sounds.  Pulmonary:     Effort: Pulmonary effort is normal. No respiratory distress.     Breath sounds: Normal breath sounds. No wheezing.  Neurological:     Mental Status: She is alert.      UC Treatments / Results  Labs (all labs ordered are listed, but only abnormal results are displayed) Labs Reviewed  POC COVID19/FLU A&B COMBO - Normal    EKG   Radiology No results found.  Procedures Procedures (including critical care time)  Medications Ordered in UC Medications - No data to display  Initial Impression / Assessment and Plan / UC Course  I have reviewed the triage vital signs and the nursing notes.  Pertinent labs & imaging results that were available during my care of the patient were reviewed by me and considered in my medical decision making (see chart for details).    Asthma exacerbation.  Afebrile and vital signs are stable.  Lungs are clear and O2 sat is 98% on room air.  Patient has needed to use her albuterol  more  often and feels tightness when she is coughing.  Her shortness of breath is at baseline.  Rapid flu and COVID-negative.  Treating today with Zithromax  and prednisone .  Instructed patient to continue using her albuterol  as directed.  Instructed her to follow-up with her PCP tomorrow.  ED precautions given.  Education provided on adult asthma.  She agrees to plan of care.  Final Clinical Impressions(s) / UC Diagnoses   Final diagnoses:  Mild intermittent asthma with acute exacerbation     Discharge Instructions      Follow up with your primary care provider tomorrow.  Go to the emergency department if you have worsening symptoms.    Take the Zithromax  and prednisone  as directed.  Use your albuterol  inhaler or nebulizer treatment as  directed.   ED Prescriptions     Medication Sig Dispense Auth. Provider   azithromycin  (ZITHROMAX ) 250 MG tablet Take 1 tablet (250 mg total) by mouth daily. Take first 2 tablets together, then 1 every day until finished. 6 tablet Wellington Half, NP   predniSONE  (DELTASONE ) 10 MG tablet Take 4 tablets (40 mg total) by mouth daily for 5 days. 20 tablet Wellington Half, NP      PDMP not reviewed this encounter.   Wellington Half, NP 03/14/24 1224

## 2024-03-14 NOTE — Telephone Encounter (Signed)
 Called patient to let her know given her symptoms, she will need to go to urgent care. Patient says she will get ready to go to [facility]

## 2024-03-14 NOTE — ED Triage Notes (Addendum)
 Patient to Urgent Care with complaints of head and nasal congestion/ fevers (max 102.3)/ headaches/ cough and tightness.   Symptoms started on Friday.  Taking tylenol  (1hr PTA)/ increased usage of her albuterol  neb (last use 5:15am).

## 2024-03-14 NOTE — Discharge Instructions (Addendum)
 Follow up with your primary care provider tomorrow.  Go to the emergency department if you have worsening symptoms.    Take the Zithromax  and prednisone  as directed.  Use your albuterol  inhaler or nebulizer treatment as directed.

## 2024-03-14 NOTE — Telephone Encounter (Signed)
  Chief Complaint: Cough Symptoms: cough, chest congestion, fever Frequency: since Friday night into Saturday Pertinent Negatives: Patient denies CP, SOB Disposition: [] ED /[] Urgent Care (no appt availability in office) / [] Appointment(In office/virtual)/ []  [place] [facility]/ [] Home Care/ [] Refused Recommended Disposition /[] [place] [facility]/ [x]  Follow-up with PCP Additional Notes: patient called with concerns for nonproductive cough, sinus congestion with yellow mucus and fever since Friday night going into Saturday morning. Patient endorses highest fever of 102.3 on Sunday-responded to Tylenol. Patient states she has been running a fever on and off. Per protocol, patient is recommended to be seen today. Patient is asking to be seen by PCP. No availability via decision tree until May 15. Patient is needing a follow up call from PCP.      Copied from CRM #[id number]. Topic: Clinical - Red Word Triage >> Mar 14, 2024  8:35 AM Adrionna Y wrote: Red Word that prompted transfer to Nurse Triage: fever   Patient believes she has a upper respiratory infection and has been running a fever for a week Reason for Disposition  [1] Fever > 101 F (38.3 C) AND [2] age > 60 years  Answer Assessment - Initial Assessment Questions 1. ONSET: "When did the cough begin?"      Started on past Friday evening 2. SEVERITY: "How bad is the cough today?"      5 out of 10 3. SPUTUM: "Describe the color of your sputum" (none, dry cough; clear, white, yellow, green)     dry 4. HEMOPTYSIS: "Are you coughing up any blood?" If so ask: "How much?" (flecks, streaks, tablespoons, etc.)     no 5. DIFFICULTY BREATHING: "Are you having difficulty breathing?" If Yes, ask: "How bad is it?" (e.g., mild, moderate, severe)    - MILD: No SOB at rest, mild SOB with walking, speaks normally in sentences, can lie down, no retractions, pulse < 100.    - MODERATE: SOB at rest, SOB with minimal exertion and prefers to sit,  cannot lie down flat, speaks in phrases, mild retractions, audible wheezing, pulse 100-120.    - SEVERE: Very SOB at rest, speaks in single words, struggling to breathe, sitting hunched forward, retractions, pulse > 120    NO   6. FEVER: "Do you have a fever?" If Yes, ask: "What is your temperature, how was it measured, and when did it start?"     10 2.3 on Sunday. Temperature running from 100-101 7. CARDIAC HISTORY: "Do you have any history of heart disease?" (e.g., heart attack, congestive heart failure)      Hx of heart murmur, high HR 8. LUNG HISTORY: "Do you have any history of lung disease?"  (e.g., pulmonary embolus, asthma, emphysema)     asthma 9. PE RISK FACTORS: "Do you have a history of blood clots?" (or: recent major surgery, recent prolonged travel, bedridden)     no 10. OTHER SYMPTOMS: "Do you have any other symptoms?" (e.g., runny nose, wheezing, chest pain)       Chest tightness 12. TRAVEL: "Have you traveled out of the country in the last month?" (e.g., travel history, exposures)       no  Protocols used: Cough - Acute Non-Productive-A-AH

## 2024-03-15 DIAGNOSIS — G4733 Obstructive sleep apnea (adult) (pediatric): Secondary | ICD-10-CM | POA: Diagnosis not present

## 2024-03-20 ENCOUNTER — Ambulatory Visit: Payer: Self-pay | Admitting: *Deleted

## 2024-03-20 ENCOUNTER — Ambulatory Visit: Payer: Self-pay

## 2024-03-20 ENCOUNTER — Encounter: Payer: Self-pay | Admitting: Internal Medicine

## 2024-03-20 ENCOUNTER — Ambulatory Visit

## 2024-03-20 ENCOUNTER — Ambulatory Visit (INDEPENDENT_AMBULATORY_CARE_PROVIDER_SITE_OTHER): Admitting: Internal Medicine

## 2024-03-20 VITALS — BP 134/82 | HR 65 | Temp 97.9°F | Ht 64.0 in | Wt 146.0 lb

## 2024-03-20 DIAGNOSIS — R051 Acute cough: Secondary | ICD-10-CM | POA: Diagnosis not present

## 2024-03-20 DIAGNOSIS — R053 Chronic cough: Secondary | ICD-10-CM | POA: Diagnosis not present

## 2024-03-20 DIAGNOSIS — J4520 Mild intermittent asthma, uncomplicated: Secondary | ICD-10-CM

## 2024-03-20 DIAGNOSIS — J0100 Acute maxillary sinusitis, unspecified: Secondary | ICD-10-CM

## 2024-03-20 DIAGNOSIS — R509 Fever, unspecified: Secondary | ICD-10-CM | POA: Diagnosis not present

## 2024-03-20 MED ORDER — AMOXICILLIN-POT CLAVULANATE 875-125 MG PO TABS: 1.0000 | ORAL_TABLET | Freq: Two times a day (BID) | ORAL | 0 refills | Status: DC

## 2024-03-20 NOTE — Telephone Encounter (Signed)
  Chief Complaint: Seen at urgent care 03/14/2024.   Antibiotic and prednisone  given.  A little better but Sat. Fever returned and having nasal and chest congestion.    Has asthma Symptoms: non productive cough, sinus congestion and fever.   This started a week ago Friday.   Seen at urgent care, antibiotic and prednisone  given, got better, no longer wheezing or coughing up anything but the Sat. Fever returned, has dry hacking cough and sinus congestion.    "I usually need 2 rounds of antibiotics to get rid of this stuff.   Frequency: Fever returned on Sat.  Pertinent Negatives: Patient denies wheezing or having to use her nebulizer now.   Feels like she has congestion in her chest but no chest tightness or shortness of breath. Disposition: [] ED /[] Urgent Care (no appt availability in office) / [x] Appointment(In office/virtual)/ []  [place] [facility]/ [] Home Care/ [] Refused Recommended Disposition /[] [place] [facility]/ []  Follow-up with PCP Additional Notes: Appt made with Dr. Norendra for today.  No appts with Dr. Geralyn Knee until June 2025.

## 2024-03-20 NOTE — Assessment & Plan Note (Signed)
-   Patient has had 10 days of persistent cough in association with upper respiratory tract infection including nasal congestion and sinus pressure -Symptoms initially improved with prednisone  and azithromycin  -However, over the last few days she has had recurrence of her low grade fevers and worsening cough without sputum production -Given the worsening of her symptoms I am concerned for possible underlying pneumonia and will obtain chest x-ray at this time -No further workup for now

## 2024-03-20 NOTE — Progress Notes (Signed)
 Acute Office Visit  Subjective:     Patient ID: Ashley Gross, female    DOB: 1950/11/18, 73 y.o.   MRN: [id number]  Chief Complaint  Patient presents with   Cough    Dry cough, drainage & chest tightness    Cough Associated symptoms include a fever, headaches and shortness of breath. Pertinent negatives include no wheezing.   Patient is in today for persistent cough and low-grade fevers.  Patient states that approximately 10 days that she developed a cough, nasal congestion with associated wheezing and chest tightness.  She was seen in an urgent care on 03/14/24.  She was prescribed azithromycin  and prednisone  (for a mild asthma exacerbation).  Her flu and COVID test were negative at urgent care.  Patient states that her symptoms initially improved slightly but over the last few days she has had worsening sinus congestion, headaches as well as recurrent fevers (100.1 F and 100.3 F).  She also complains of worsening dry cough.  She states that her wheezing has improved.  No chest pain or tightness currently.  Review of Systems  Constitutional:  Positive for fever and malaise/fatigue.  HENT:  Positive for congestion and sinus pain.   Respiratory:  Positive for cough and shortness of breath. Negative for sputum production and wheezing.   Cardiovascular: Negative.   Gastrointestinal: Negative.   Neurological:  Positive for headaches.  Psychiatric/Behavioral: Negative.          Objective:    BP 134/82   Pulse 65   Temp 97.9 F (36.6 C)   Ht 5\' 4"  (1.626 m)   Wt 146 lb (66.2 kg)   SpO2 98%   BMI 25.06 kg/m    Physical Exam Constitutional:      Appearance: Normal appearance.  HENT:     Head: Normocephalic and atraumatic.     Right Ear: Tympanic membrane, ear canal and external ear normal.     Left Ear: Tympanic membrane, ear canal and external ear normal.     Nose:     Right Sinus: No maxillary sinus tenderness or frontal sinus tenderness.     Left Sinus: Maxillary  sinus tenderness present. No frontal sinus tenderness.     Mouth/Throat:     Mouth: Mucous membranes are moist.     Pharynx: Oropharynx is clear. Posterior oropharyngeal erythema present. No oropharyngeal exudate.     Comments: Mild pharyngeal erythema noted Cardiovascular:     Rate and Rhythm: Normal rate and regular rhythm.     Heart sounds: Normal heart sounds.  Pulmonary:     Effort: Pulmonary effort is normal.     Breath sounds: Normal breath sounds. No wheezing, rhonchi or rales.  Neurological:     Mental Status: She is alert.     No results found for any visits on 03/20/24.      Assessment & Plan:   Problem List Items Addressed This Visit       Respiratory   Acute sinusitis - Primary   - Patient presented to urgent care last week with a dry cough, chest tightness, wheezing and low-grade fevers as well as rhinorrhea and nasal congestion. -Patient was prescribed prednisone  and azithromycin  -Patient states that the symptoms initially improved but then over the last few days she has noted worsening sinus congestion, headaches as well as recurrence of her low-grade fevers (100.1 F as well as 100.3 F) -Given that her symptoms initially improved and then worsened I am concerned for superimposed bacterial infection likely sinusitis  given her maxillary sinus tenderness and sinus headaches -Will treat the patient with Augmentin  to complete a 7-day course -Given her persistent cough and low-grade fevers would also obtain a chest x-ray to rule out a pneumonia -Patient structured to continue with conservative measures including DayQuil/NyQuil for her cough, warm showers with steam inhalation as well as saline nasal rinses and Nettie pot use -Return precautions given to the patient      Relevant Medications   amoxicillin -clavulanate (AUGMENTIN ) 875-125 MG tablet   Asthma   - Patient was seen in urgent care last week (03/14/2024) for cough, chest tightness, wheezing and shortness of  breath - She was treated for an asthma exacerbation with prednisone .  She also treated for possible underlying bacterial infection with azithromycin  -Patient states that her symptoms improved slightly with improvement of her wheezing and shortness of breath but has had persistence of her cough -On exam today, her lungs are clear to auscultation with no wheezes noted -I suspect that her asthma exacerbation has now resolved but she does have a persistent infection -Continue with Wixela and DuoNebs as needed -No further treatment for her asthma required at this time.        Other   Cough   - Patient has had 10 days of persistent cough in association with upper respiratory tract infection including nasal congestion and sinus pressure -Symptoms initially improved with prednisone  and azithromycin  -However, over the last few days she has had recurrence of her low grade fevers and worsening cough without sputum production -Given the worsening of her symptoms I am concerned for possible underlying pneumonia and will obtain chest x-ray at this time -No further workup for now      Relevant Orders   DG Chest 2 View    Meds ordered this encounter  Medications   amoxicillin -clavulanate (AUGMENTIN ) 875-125 MG tablet    Sig: Take 1 tablet by mouth 2 (two) times daily.    Dispense:  14 tablet    Refill:  0    No follow-ups on file.  Jamirra Curnow, MD

## 2024-03-20 NOTE — Telephone Encounter (Signed)
 This RN made first attempt to reach patient. LVM for call back to [phone]  Copied from CRM #[id number]. Topic: Clinical - Pink Word Triage >> Mar 20, 2024  8:14 AM Freya Jesus wrote: Reason for Triage: Patient states she went to urgent care last week and was given some medication and she feels it did not help much, she's still feeling congested and experiencing a low grade fever. Patient would like to know what she should do and if another round of antibiotics may help.

## 2024-03-20 NOTE — Assessment & Plan Note (Signed)
-   Patient presented to urgent care last week with a dry cough, chest tightness, wheezing and low-grade fevers as well as rhinorrhea and nasal congestion. -Patient was prescribed prednisone  and azithromycin  -Patient states that the symptoms initially improved but then over the last few days she has noted worsening sinus congestion, headaches as well as recurrence of her low-grade fevers (100.1 F as well as 100.3 F) -Given that her symptoms initially improved and then worsened I am concerned for superimposed bacterial infection likely sinusitis given her maxillary sinus tenderness and sinus headaches -Will treat the patient with Augmentin  to complete a 7-day course -Given her persistent cough and low-grade fevers would also obtain a chest x-ray to rule out a pneumonia -Patient structured to continue with conservative measures including DayQuil/NyQuil for her cough, warm showers with steam inhalation as well as saline nasal rinses and Nettie pot use -Return precautions given to the patient

## 2024-03-20 NOTE — Telephone Encounter (Signed)
 Message from Freya Jesus sent at 03/20/2024  8:19 AM EDT  Copied From CRM [id number]. Reason for Triage: Patient states she went to urgent care last week and was given some medication and she feels it did not help much, she's still feeling congested and experiencing a low grade fever. Patient would like to know what she should do and if another round of antibiotics may help.    Call History  Contact Date/Time Type Contact Phone/Fax By  03/20/2024 08:07 AM EDT Phone (Incoming) [facility] [phone] Arlys Lamer   Reason for Disposition  [1] Continuous (nonstop) coughing interferes with work or school AND [2] no improvement using cough treatment per Care Advice  Answer Assessment - Initial Assessment Questions 1. ONSET: "When did the cough begin?"      I have a non productive cough.  I'm taking Nyquil and Dayquil and cough drops.   I first got sick a week ago Friday evening.   Tues. There were no openings.  So I went to the urgent care on Tues 03/14/2024.    They put me on antibiotic and prednisone .  Not using nebulizer now.    Still having congestion in my nose and my chest.    The antibiotic helped a little.   Sat. Night the fever came back.    2. SEVERITY: "How bad is the cough today?"      Still coughing 3. SPUTUM: "Describe the color of your sputum" (none, dry cough; clear, white, yellow, green)     No   dry cough 4. HEMOPTYSIS: "Are you coughing up any blood?" If so ask: "How much?" (flecks, streaks, tablespoons, etc.)     Not asked 5. DIFFICULTY BREATHING: "Are you having difficulty breathing?" If Yes, ask: "How bad is it?" (e.g., mild, moderate, severe)    - MILD: No SOB at rest, mild SOB with walking, speaks normally in sentences, can lie down, no retractions, pulse < 100.    - MODERATE: SOB at rest, SOB with minimal exertion and prefers to sit, cannot lie down flat, speaks in phrases, mild retractions, audible wheezing, pulse 100-120.    - SEVERE: Very SOB at rest, speaks in  single words, struggling to breathe, sitting hunched forward, retractions, pulse > 120      I'm not wheezing anymore.   No tightness in my chest but I can still feel like something in there making me cough.   My sinuses are very congested too 6. FEVER: "Do you have a fever?" If Yes, ask: "What is your temperature, how was it measured, and when did it start?"     Yes 7. CARDIAC HISTORY: "Do you have any history of heart disease?" (e.g., heart attack, congestive heart failure)      Not asked 8. LUNG HISTORY: "Do you have any history of lung disease?"  (e.g., pulmonary embolus, asthma, emphysema)     asthma 9. PE RISK FACTORS: "Do you have a history of blood clots?" (or: recent major surgery, recent prolonged travel, bedridden)     Not asked 10. OTHER SYMPTOMS: "Do you have any other symptoms?" (e.g., runny nose, wheezing, chest pain)       See above 11. PREGNANCY: "Is there any chance you are pregnant?" "When was your last menstrual period?"       N/A 12. TRAVEL: "Have you traveled out of the country in the last month?" (e.g., travel history, exposures)       N/A  Protocols used: Cough - Acute Non-Productive-A-AH

## 2024-03-20 NOTE — Patient Instructions (Signed)
-   It was a pleasure meeting you today -Given that your symptoms are worsening over the last few days I am concerned for superimposed bacterial infection and will prescribe an antibiotic for you (Augmentin  for 7-day course) -We will also obtain a chest x-ray to rule out an underlying pneumonia given your cough and low-grade fevers -Please continue conservative measures like warm showers with steam inhalation, DayQuil/NyQuil or Mucinex , saline nasal rinses and Nettie pot -Please contact us  with any questions or concerns  - If her symptoms do not improve or persist despite the above measures please follow-up with us  for further evaluation

## 2024-03-20 NOTE — Telephone Encounter (Signed)
 See alternate Nurse Triage note.

## 2024-03-20 NOTE — Assessment & Plan Note (Signed)
-   Patient was seen in urgent care last week (03/14/2024) for cough, chest tightness, wheezing and shortness of breath - She was treated for an asthma exacerbation with prednisone .  She also treated for possible underlying bacterial infection with azithromycin  -Patient states that her symptoms improved slightly with improvement of her wheezing and shortness of breath but has had persistence of her cough -On exam today, her lungs are clear to auscultation with no wheezes noted -I suspect that her asthma exacerbation has now resolved but she does have a persistent infection -Continue with Wixela and DuoNebs as needed -No further treatment for her asthma required at this time.

## 2024-03-21 ENCOUNTER — Ambulatory Visit (INDEPENDENT_AMBULATORY_CARE_PROVIDER_SITE_OTHER): Payer: PPO | Admitting: Gastroenterology

## 2024-03-21 ENCOUNTER — Ambulatory Visit: Payer: Self-pay

## 2024-03-21 VITALS — BP 155/84 | HR 82 | Temp 97.9°F | Wt 145.0 lb

## 2024-03-21 DIAGNOSIS — K51411 Inflammatory polyps of colon with rectal bleeding: Secondary | ICD-10-CM

## 2024-03-21 DIAGNOSIS — K529 Noninfective gastroenteritis and colitis, unspecified: Secondary | ICD-10-CM

## 2024-03-21 DIAGNOSIS — K8681 Exocrine pancreatic insufficiency: Secondary | ICD-10-CM

## 2024-03-21 DIAGNOSIS — D500 Iron deficiency anemia secondary to blood loss (chronic): Secondary | ICD-10-CM

## 2024-03-21 NOTE — Progress Notes (Signed)
 Ashley Oz, MD [address]  [address]  [place], [place] [address]  Main: [phone]  Fax: [phone]    Gastroenterology Consultation  Referring Provider:     Dellar Fenton, MD Primary Care Physician:  Dellar Fenton, MD Primary Gastroenterologist:  Dr. Karma Gross Reason for Consultation:    Inflammatory polyposis, chronic diarrhea, iron deficiency anemia       HPI:   Ashley Gross is a 73 y.o. white female referred by Dr. Dellar Fenton, MD  for consultation & management of chronic diarrhea. white female with h/o psoriatic arthritis, chronic diarrhea, inflammatory polyps in colon presents for f/u of rectal bleeding and chronic diarrhea. She underwent ligation of RA, RP. LL internal hemorrhoids. Had extensive workup in the past which is negative for gastrinoma, IBD, microscopic colitis, celiac disease, chronic infections.  Rectal bleeding had resolved after resection of large inflammatory polyps.  However, she developed severe weight loss, severe iron deficiency anemia with recurrence of rectal bleeding.  Therefore, she was initiated on Entyvio  monotherapy in July 2023.  Entyvio  maintenance therapy increased to 2 every 6 weeks in 06/2023.  Patient states that January was a stressful month for her because of shifting her house, she had flareup of GI symptoms that has resulted in weight loss as well.  She is now settling down and focusing on her health and oral intake.  Her iron deficiency anemia has been stable, recently saw Dr. Randy Buttery, iron levels are within normal limits, iron replacement on hold.  Anemia has resolved, does report intermittent scant rectal bleeding which has improved since January.  She has regained few pounds.  Her diarrhea has significantly improved. She does not have any specific concerns today. Continues to take Creon  as well.  I have increased her Entyvio  infusion to every 4 weeks 6 weeks, however she has been still receiving every 6 weeks interval  only.  GI Procedures:  Colonoscopy 07/24/2021 - The examined portion of the ileum was normal. - Three 5 to 7 mm polyps in the transverse colon, in the ascending colon and in the cecum, removed with a cold snare. Resected and retrieved. - Normal mucosa in the entire examined colon. Biopsied. - Polyposis at the recto-sigmoid colon, in the sigmoid colon and in the ascending colon. DIAGNOSIS:  A.  COLON POLYP, CECUM; COLD SNARE:  - INFLAMMATORY POLYP.  - NEGATIVE FOR DYSPLASIA AND MALIGNANCY.   B.  COLON POLYP, ASCENDING; COLD SNARE:  - INFLAMMATORY POLYP.  - NEGATIVE FOR DYSPLASIA AND MALIGNANCY.   C.  COLON; RANDOM COLD BIOPSY:  - COLONIC MUCOSA WITH INTACT CRYPT ARCHITECTURE.  - NEGATIVE FOR MICROSCOPIC COLITIS, DYSPLASIA, AND MALIGNANCY.   D.  COLON POLYP, TRANSVERSE; COLD SNARE:  - FECAL FRAGMENTS; NO TISSUE IS IDENTIFIED.   Colonoscopy 01/04/2019 - Hemorrhoids found on perianal exam. - The examined portion of the ileum was normal. - Multiple 10 to 30 mm, recently bleeding polyps in the sigmoid colon and in the descending colon, removed with a hot snare. Polyp resection was incomplete, and the resected tissue was partially retrieved.     Colonoscopy 04/06/2007 Indications:  last colonoscopy 2001, anemia. Impression: -Congested mucosa in the entire colon. -Nodules in the proximal ascending colon, biopsied. -Nodule in the distal ascending colon, biopsied. -Congested erythematous and friable (with contact bleeding) mucosa in the  ascending colon and in the cecum. . "CECUM" (ENDOSCOPIC BIOPSY)     COLONIC MUCOSA WITH NO PATHOLOGIC DIAGNOSIS.     NO ACTIVE OR CHRONIC  COLITIS IS SEEN.     NO EVIDENCE OF LYMPHOCYTIC OR COLLAGENOUS COLITIS IS SEEN. B. "RIGHT COLON NODULES" (ENDOSCOPIC BIOPSY):     INFLAMMATORY POLYPS. C. "RECTOSIGMOID COLON" (ENDOSCOPIC BIOPSY):     COLONIC MUCOSA WITH NO PATHOLOGIC DIAGNOSIS.     NO ACTIVE OR CHRONIC COLITIS IS SEEN.     NO EVIDENCE OF  LYMPHOCYTIC OR COLLAGENOUS COLITIS IS SEEN. D. "DISTAL ASCENDING COLON" (ENDOSCOPIC BIOPSY):     INFLAMMATORY POLYPS. ------------------------------------------------------------------------------------------------- Colonoscopy 2014  A. "ILEUM" (ENDOSCOPIC BIOPSY):     SMALL INTESTINAL MUCOSA WITH NO PATHOLOGIC DIAGNOSIS.     NO ACTIVE OR CHRONIC ENTERITIS IS SEEN.     NO GRANULOMATOUS INFLAMMATION IS SEEN. B. "COLON, ILEOCECAL VALVE NODULE" (BIOPSY):     COLONIC MUCOSA, WITH REACTIVE CHANGES.     NO ADENOMATOUS MUCOSA IS IDENTIFIED. C. "RIGHT COLON" (ENDOSCOPIC BIOPSY):     COLONIC MUCOSA WITH NO PATHOLOGIC DIAGNOSIS.     NO ACTIVE OR CHRONIC COLITIS IS SEEN.     NO EVIDENCE OF LYMPHOCYTIC OR COLLAGENOUS COLITIS IS SEEN. D. "HEPATIC FLEXURE MASS" (ENDOSCOPIC BIOPSY):     INFLAMMATORY POLYP.     NO ADENOMATOUS MUCOSA IS SEEN. E. "TRANSVERSE COLON" (ENDOSCOPIC BIOPSY):     COLONIC MUCOSA WITH NO PATHOLOGIC DIAGNOSIS.     NO ACTIVE OR CHRONIC COLITIS IS SEEN.     NO EVIDENCE OF LYMPHOCYTIC OR COLLAGENOUS COLITIS IS SEEN. F. "LEFT COLON" (ENDOSCOPIC BIOPSY):     COLONIC MUCOSA WITH NO PATHOLOGIC DIAGNOSIS.     NO ACTIVE OR CHRONIC COLITIS IS SEEN.     NO EVIDENCE OF LYMPHOCYTIC OR COLLAGENOUS COLITIS IS SEEN.     A MINUTE HYPERPLASTIC POLYP IS ALSO PRESENT. G. "LEFT COLON POLYP(S)" (ENDOSCOPIC POLYPECTOMY):     TUBULAR ADENOMA.     NO HIGH GRADE DYSPLASIA OR CARCINOMA IS SEEN.  Colonoscopy 12/27/2017 - The examined portion of the ileum was normal. Biopsied. - One 12 mm polyp in the ascending colon, removed with a hot snare. Resected and retrieved. - One 40 mm polyp in the transverse colon, removed with a hot snare. Incomplete resection. Resected tissue retrieved. - A few 8 to 15 mm, non-bleeding polyps in the sigmoid colon. Resection not attempted. - The distal rectum and anal verge are normal on retroflexion view.   Upper endoscopy 12/27/2017 - Normal duodenal bulb and second  portion of the duodenum. Biopsied. - Erythematous mucosa in the gastric body and antrum. - Normal cardia, gastric fundus and incisura. Biopsied. - Normal gastroesophageal junction and esophagus.  DIAGNOSIS:  A. DUODENUM; COLD BIOPSY:  - DUODENAL MUCOSA WITH PRESERVED VILLOUS ARCHITECTURE AND BRUNNER'S  GLAND HYPERPLASIA.  - NEGATIVE FOR INTRA-EPITHELIAL LYMPHOCYTOSIS, DYSPLASIA AND MALIGNANCY.   B.  STOMACH; COLD BIOPSY:  - ANTRAL AND OXYNTIC MUCOSA WITH MILD CHRONIC GASTRITIS.  - NEGATIVE FOR H. PYLORI, DYSPLASIA AND MALIGNANCY.   C.  TERMINAL ILEUM; COLD BIOPSY:  - SMALL BOWEL MUCOSA WITH INTACT VILLOUS ARCHITECTURE.  - NEGATIVE FOR INTRAEPITHELIAL LYMPHOCYTOSIS, DYSPLASIA AND MALIGNANCY.   D.  RANDOM RIGHT COLON; COLD BIOPSY:  - COLONIC MUCOSA NEGATIVE FOR MICROSCOPIC COLITIS, DYSPLASIA AND  MALIGNANCY.    E.  COLON POLYP, ASCENDING; HOT SNARE:  - INFLAMMATORY-TYPE POLYP.   F.  COLON POLYP, TRANSVERSE; HOT SNARE:  - INFLAMMATORY-TYPE POLYP.   G.  RANDOM TRANSVERSE COLON; COLD BIOPSY:  - COLONIC MUCOSA NEGATIVE FOR MICROSCOPIC COLITIS, DYSPLASIA AND  MALIGNANCY.   Past Medical History:  Diagnosis Date   Abnormal liver function test 10/06/2012  Anemia 01/25/2022   Anemia, unspecified    Asthma    B12 deficiency 02/11/2019   Bleeding internal hemorrhoids 09/13/2017   Choledocholithiasis 06/29/2012   Formatting of this note might be different from the original. Dilated CBD on abdominal imaging 04/2012   Chronic diarrhea 02/16/2013   Colitis    Complication of anesthesia    asthma attack after shoulder surgery   Diabetes mellitus (HCC)    type II   Esophagitis    GERD (gastroesophageal reflux disease)    Heart murmur    for years- nothing to be concerned about.   History of kidney stones    Hx of arteriovenous malformation (AVM)    Hypertension    IBS (irritable bowel syndrome)    Loss of weight    Peripheral vascular disease (HCC)    Pernicious anemia     Personal history of colonic polyps 02/10/2013   02/08/13 colonoscopy - question of rectal varices, two 3-41mm polyps in the sigmoid colon, mass at the hepatic flexure, one 5mm polyp at the hepatic flexure, nodule at the ileocecal valve, congested mucusa in the entire colon, flattened villi mucosa in the terminal ileum.     Pneumonia    Psoriatic arthritis (HCC)    s/p penicillamine, plaquenil, MTX, sulfasalazine.  s/p Embrel, Humira,.  Iritis.     Pure hypercholesterolemia    Rectal bleeding 07/15/2017   Stroke (HCC) 05/29/2022   per patient    Past Surgical History:  Procedure Laterality Date   ABDOMINAL HYSTERECTOMY  1981   ovaries left in place   ANKLE SURGERY     right   BUNIONECTOMY     CHOLECYSTECTOMY  1989   COLONOSCOPY WITH PROPOFOL  N/A 12/27/2017   Procedure: COLONOSCOPY WITH PROPOFOL ;  Surgeon: Selena Daily, MD;  Location: [facility];  Service: Gastroenterology;  Laterality: N/A;   COLONOSCOPY WITH PROPOFOL  N/A 01/04/2019   Procedure: COLONOSCOPY WITH PROPOFOL ;  Surgeon: Selena Daily, MD;  Location: [facility];  Service: Endoscopy;  Laterality: N/A;  Diabetic - oral and injectable   COLONOSCOPY WITH PROPOFOL  N/A 07/24/2021   Procedure: COLONOSCOPY WITH PROPOFOL ;  Surgeon: Selena Daily, MD;  Location: [facility];  Service: Endoscopy;  Laterality: N/A;  Diabetic   ESOPHAGOGASTRODUODENOSCOPY (EGD) WITH PROPOFOL  N/A 12/27/2017   Procedure: ESOPHAGOGASTRODUODENOSCOPY (EGD) WITH PROPOFOL ;  Surgeon: Selena Daily, MD;  Location: [facility];  Service: Gastroenterology;  Laterality: N/A;   HAND SURGERY     right   HEEL SPUR SURGERY     IR 3D INDEPENDENT WKST  10/01/2021   IR ANGIO INTRA EXTRACRAN SEL COM CAROTID INNOMINATE UNI L MOD SED  10/01/2021   IR ANGIO INTRA EXTRACRAN SEL INTERNAL CAROTID UNI R MOD SED  10/01/2021   IR ANGIO INTRA EXTRACRAN SEL INTERNAL CAROTID UNI R MOD SED  05/29/2022   IR ANGIO VERTEBRAL SEL VERTEBRAL BILAT MOD  SED  10/01/2021   IR ANGIO VERTEBRAL SEL VERTEBRAL UNI R MOD SED  05/29/2022   IR CT HEAD LTD  10/01/2021   IR TRANSCATH/EMBOLIZ  10/01/2021   IR US  GUIDE VASC ACCESS RIGHT  10/01/2021   IR US  GUIDE VASC ACCESS RIGHT  05/29/2022   NOSE SURGERY     x2   POLYPECTOMY  01/04/2019   Procedure: POLYPECTOMY;  Surgeon: Selena Daily, MD;  Location: [facility];  Service: Endoscopy;;   POLYPECTOMY N/A 07/24/2021   Procedure: POLYPECTOMY;  Surgeon: Selena Daily, MD;  Location: [facility];  Service: Endoscopy;  Laterality: N/A;   RADIOLOGY WITH ANESTHESIA N/A 10/01/2021   Procedure: Vania Genin WITH ANESTHESIA;  Surgeon: de Macedo Rodrigues, Katyucia, MD;  Location: [facility];  Service: Radiology;  Laterality: N/A;   SHOULDER ARTHROSCOPY WITH OPEN ROTATOR CUFF REPAIR Left 09/20/2018   Procedure: SHOULDER ARTHROSCOPY WITH OPEN ROTATOR CUFF REPAIR;  Surgeon: Elner Hahn, MD;  Location: [facility];  Service: Orthopedics;  Laterality: Left;   SHOULDER CLOSED REDUCTION Left 12/21/2018   Procedure: MANIPULATION UNDER ANESTHESIA WITH STEROID INJECTION;  Surgeon: Elner Hahn, MD;  Location: [facility];  Service: Orthopedics;  Laterality: Left;  Diabetic - insulin  and oral meds   UMBILICAL HERNIA REPAIR     XI ROBOTIC ASSISTED INGUINAL HERNIA REPAIR WITH MESH Left 12/16/2021   Procedure: XI ROBOTIC ASSISTED INGUINAL HERNIA REPAIR WITH MESH;  Surgeon: [name] Hare, MD;  Location: [facility];  Service: General;  Laterality: Left;     Current Outpatient Medications:    acetaminophen  (TYLENOL ) 500 MG tablet, Take 2 tablets (1,000 mg total) by mouth every 6 (six) hours as needed for mild pain., Disp: , Rfl:    albuterol  (VENTOLIN  HFA) 108 (90 Base) MCG/ACT inhaler, Inhale 2 puffs into the lungs every 6 (six) hours as needed for wheezing or shortness of breath., Disp: , Rfl:    amLODipine  (NORVASC ) 5 MG tablet, TAKE 1 TABLET(5 MG) BY MOUTH TWICE DAILY, Disp: 180 tablet, Rfl: 1    amoxicillin -clavulanate (AUGMENTIN ) 875-125 MG tablet, Take 1 tablet by mouth 2 (two) times daily., Disp: 14 tablet, Rfl: 0   Azelastine  HCl 137 MCG/SPRAY SOLN, PLACE 2 SPRAYS INTO BOTH NOSTRILS 2 (TWO) TIMES DAILY. USE IN EACH NOSTRIL AS DIRECTED, Disp: 90 mL, Rfl: 1   cetirizine (ZYRTEC) 10 MG tablet, Take 10 mg by mouth daily., Disp: , Rfl:    Cholecalciferol  (VITAMIN D ) 50 MCG (2000 UT) tablet, Take 2,000 Units by mouth daily., Disp: , Rfl:    CREON  36000-114000 units CPEP capsule, Take 1-2 capsules (36,000-72,000 Units total) by mouth See admin instructions. Take 720000 units with each meal and 36000 units with snacks, Disp: 240 capsule, Rfl: 3   Eszopiclone  3 MG TABS, Take 1 tablet (3 mg total) by mouth at bedtime. Take immediately before bedtime, Disp: 30 tablet, Rfl: 5   insulin  degludec (TRESIBA  FLEXTOUCH) 100 UNIT/ML FlexTouch Pen, Inject 16 Units into the skin daily. (Patient taking differently: Inject 14 Units into the skin daily.), Disp: 15 mL, Rfl: 1   loperamide  (IMODIUM ) 2 MG capsule, Take 4 mg by mouth daily., Disp: , Rfl:    losartan  (COZAAR ) 100 MG tablet, TAKE 1 TABLET(100 MG) BY MOUTH AT BEDTIME, Disp: 90 tablet, Rfl: 3   mometasone  (ELOCON ) 0.1 % ointment, Apply 1 Application topically 2 (two) times daily., Disp: , Rfl:    omeprazole  (PRILOSEC) 20 MG capsule, TAKE 1 CAPSULE(20 MG) BY MOUTH TWICE DAILY, Disp: 180 capsule, Rfl: 3   ONE TOUCH ULTRA TEST test strip, PATIENT NEEDS NEW METER STRIPS AND LANCETS FOR ONE TOUCH. HER INSURANCE NO LONGER COVERS ACCUCHEK., Disp: 100 each, Rfl: PRN   potassium chloride  (KLOR-CON  M) 10 MEQ tablet, TAKE 1 TABLET BY MOUTH EVERY DAY, Disp: 30 tablet, Rfl: 0   rosuvastatin  (CRESTOR ) 10 MG tablet, TAKE 1 TABLET(10 MG) BY MOUTH DAILY, Disp: 90 tablet, Rfl: 3   vedolizumab  (ENTYVIO ) 300 MG injection, Inject 300 mg into the vein 4 (four) times a week., Disp: , Rfl:    WIXELA INHUB 100-50 MCG/ACT AEPB, INHALE 1 PUFF INTO THE LUNGS  TWICE A DAY, Disp: 60  each, Rfl: 3   Family History  Problem Relation Age of Onset   Diabetes Other    Hypertension Other    Breast cancer Neg Hx    Colon cancer Neg Hx      Social History   Tobacco Use   Smoking status: Never    Passive exposure: Never   Smokeless tobacco: Never  Vaping Use   Vaping status: Never Used  Substance Use Topics   Alcohol use: Never   Drug use: Never    Allergies as of 03/21/2024 - Review Complete 03/21/2024  Allergen Reaction Noted   Aspirin  Other (See Comments) 10/06/2012   Beta adrenergic blockers  10/06/2012   Lisinopril  Cough 12/21/2018   Mtx support [cobalamine combinations] Other (See Comments) 10/06/2012   Nsaids  02/18/2023   Vasotec [enalapril] Cough 10/06/2012   Verapamil  Other (See Comments) 10/06/2012   Voltaren [diclofenac sodium]  10/06/2012   Erythromycin  Other (See Comments) 10/06/2012   Indocin [indomethacin]  10/06/2012   Jardiance  [empagliflozin ] Other (See Comments) 02/26/2020    Review of Systems:    All systems reviewed and negative except where noted in HPI.   Physical Exam:  BP (!) 155/84 (BP Location: Left Arm, Patient Position: Sitting, Cuff Size: Normal)   Pulse 82   Temp 97.9 F (36.6 C) (Oral)   Wt 145 lb (65.8 kg)   BMI 24.89 kg/m  No LMP recorded. Patient has had a hysterectomy.  General:   Alert, moderately built, moderately nourished, pleasant and cooperative in NAD Head:  Normocephalic and atraumatic. Eyes:  Sclera clear, no icterus.   Conjunctiva pink. Ears:  Normal auditory acuity. Nose:  No deformity, discharge, or lesions. Mouth:  No deformity or lesions,oropharynx pink & moist. Neck:  Supple; no masses or thyromegaly. Lungs:  Respirations even and unlabored.  Clear throughout to auscultation.   No wheezes, crackles, or rhonchi. No acute distress. Heart:  Regular rate and rhythm; no murmurs, clicks, rubs, or gallops. Abdomen:  Normal bowel sounds.  Obese, Soft, non-tender distended, no hepatosplenomegaly.  No  guarding or rebound tenderness.   Rectal: Not done today Msk:  Symmetrical without gross deformities. Good, equal movement & strength bilaterally. Pulses:  Normal pulses noted. Extremities:  No clubbing or edema.  No cyanosis. Neurologic:  Alert and oriented x3;  grossly normal neurologically. Skin:  Intact without significant lesions or rashes. No jaundice. Psych:  Alert and cooperative. Normal mood and affect.  Imaging Studies: Reviewed  Assessment and Plan:   KATENA PETITJEAN is a 73 y.o. white female with h/o psoriatic arthritis, chronic diarrhea, inflammatory polyps in colon presents for f/u of rectal bleeding and chronic diarrhea. She underwent ligation of RA, RP. LL internal hemorrhoids. Had extensive workup in the past which is negative for gastrinoma, IBD, microscopic colitis, celiac disease, chronic infections.  Rectal bleeding had resolved after resection of large inflammatory polyps.  However, she developed severe weight loss, severe iron deficiency anemia with recurrence of rectal bleeding.  Therefore, she was initiated on Entyvio  monotherapy in July 2023.  Now with worsening of diarrhea and bleeding.  However, she developed severe weight loss, severe iron deficiency anemia with recurrence of rectal bleeding.  Therefore, she was initiated on Entyvio  monotherapy in July 2023, increased maintenance to every 6 weeks in 06/2023.  Recent exacerbation of symptoms in January 2025, currently resolved    Inflammatory polyposis of the colon are inflammatory Polyposis is a rare phenomenon which can lead to rectal bleeding and  chronic diarrhea.  This may or may not be associated with inflammatory bowel disease.  Her colon biopsies were negative for inflammatory bowel disease.  Few case reports have been mentioned in the literature that in addition to removal of the large polyps, there might be a role for immunosuppressive therapy such as Biologics.  After discussing about the role of Entyvio ,  patient has been consented to start Entyvio  monotherapy.  Her symptoms have resolved for several months, then she had breakthrough symptoms of diarrhea and rectal bleeding about 2 weeks before she is due for her next Entyvio  infusion.  Undetectable vedolizumab  antibodies, therefore increased frequency to every 6 weeks in 06/2023, has been tolerating it well. Cannot do Viberzi because she had gallbladder taken out Continue Imodium  as needed Will switch from Entyvio  infusion to subcutaneous injections every 4 weeks   Severe exocrine pancreatic insufficiency: Weight loss has resolved, Normal BMI Fecal elastase level 64 in 07/2021 Continue Creon  36K 2 capsules with each meal and 1 with snack, based on her weight CT abdomen and pelvis with contrast revealed diffuse edema of the large bowel secondary to inflammatory polyposis     Iron and B12 deficiency anemia: Resolved Iron levels are currently normal Continue to follow-up with hematology for parenteral iron therapy as needed  Follow up in 4 months   Ashley Oz, MD

## 2024-03-22 ENCOUNTER — Encounter: Payer: Self-pay | Admitting: Gastroenterology

## 2024-03-22 DIAGNOSIS — M778 Other enthesopathies, not elsewhere classified: Secondary | ICD-10-CM | POA: Diagnosis not present

## 2024-03-22 DIAGNOSIS — L909 Atrophic disorder of skin, unspecified: Secondary | ICD-10-CM | POA: Diagnosis not present

## 2024-03-22 DIAGNOSIS — B351 Tinea unguium: Secondary | ICD-10-CM | POA: Diagnosis not present

## 2024-03-22 DIAGNOSIS — L600 Ingrowing nail: Secondary | ICD-10-CM | POA: Diagnosis not present

## 2024-03-24 LAB — GASTRIN: Gastrin: 68 pg/mL (ref 0–115)

## 2024-03-28 ENCOUNTER — Ambulatory Visit: Payer: Self-pay | Admitting: Gastroenterology

## 2024-03-28 ENCOUNTER — Telehealth: Payer: Self-pay

## 2024-03-28 NOTE — Telephone Encounter (Signed)
 Patient assistance PPW faxed to [employer] Entyvio ... Awaiting response

## 2024-03-31 DIAGNOSIS — G4733 Obstructive sleep apnea (adult) (pediatric): Secondary | ICD-10-CM | POA: Diagnosis not present

## 2024-04-01 ENCOUNTER — Other Ambulatory Visit: Payer: Self-pay | Admitting: Internal Medicine

## 2024-04-05 ENCOUNTER — Other Ambulatory Visit (INDEPENDENT_AMBULATORY_CARE_PROVIDER_SITE_OTHER)

## 2024-04-05 DIAGNOSIS — Z794 Long term (current) use of insulin: Secondary | ICD-10-CM

## 2024-04-05 DIAGNOSIS — E1165 Type 2 diabetes mellitus with hyperglycemia: Secondary | ICD-10-CM

## 2024-04-05 DIAGNOSIS — E7800 Pure hypercholesterolemia, unspecified: Secondary | ICD-10-CM

## 2024-04-05 LAB — LIPID PANEL
Cholesterol: 161 mg/dL (ref 0–200)
HDL: 48.9 mg/dL (ref >39.00)
LDL Cholesterol: 86 mg/dL (ref 0–99)
NonHDL: 111.88
Total CHOL/HDL Ratio: 3
Triglycerides: 131 mg/dL (ref 0.0–149.0)
VLDL: 26.2 mg/dL (ref 0.0–40.0)

## 2024-04-05 LAB — TSH: TSH: 2.94 u[IU]/mL (ref 0.35–5.50)

## 2024-04-05 LAB — HEMOGLOBIN A1C: Hgb A1c MFr Bld: 6.3 % (ref 4.6–6.5)

## 2024-04-05 LAB — HEPATIC FUNCTION PANEL
ALT: 11 U/L (ref 0–35)
AST: 19 U/L (ref 0–37)
Albumin: 3.8 g/dL (ref 3.5–5.2)
Alkaline Phosphatase: 75 U/L (ref 39–117)
Bilirubin, Direct: 0.1 mg/dL (ref 0.0–0.3)
Total Bilirubin: 0.5 mg/dL (ref 0.2–1.2)
Total Protein: 5.9 g/dL — ABNORMAL LOW (ref 6.0–8.3)

## 2024-04-05 LAB — BASIC METABOLIC PANEL WITH GFR
BUN: 17 mg/dL (ref 6–23)
CO2: 26 meq/L (ref 19–32)
Calcium: 8.3 mg/dL — ABNORMAL LOW (ref 8.4–10.5)
Chloride: 107 meq/L (ref 96–112)
Creatinine, Ser: 0.95 mg/dL (ref 0.40–1.20)
GFR: 59.57 mL/min — ABNORMAL LOW (ref >60.00)
Glucose, Bld: 134 mg/dL — ABNORMAL HIGH (ref 70–99)
Potassium: 3.7 meq/L (ref 3.5–5.1)
Sodium: 140 meq/L (ref 135–145)

## 2024-04-06 ENCOUNTER — Ambulatory Visit: Payer: Self-pay | Admitting: Internal Medicine

## 2024-04-07 ENCOUNTER — Ambulatory Visit: Admitting: Internal Medicine

## 2024-04-07 ENCOUNTER — Encounter: Payer: Self-pay | Admitting: Internal Medicine

## 2024-04-07 VITALS — BP 118/74 | HR 76 | Temp 97.9°F | Resp 16 | Ht 64.0 in | Wt 147.0 lb

## 2024-04-07 DIAGNOSIS — F439 Reaction to severe stress, unspecified: Secondary | ICD-10-CM | POA: Diagnosis not present

## 2024-04-07 DIAGNOSIS — D509 Iron deficiency anemia, unspecified: Secondary | ICD-10-CM | POA: Diagnosis not present

## 2024-04-07 DIAGNOSIS — E1165 Type 2 diabetes mellitus with hyperglycemia: Secondary | ICD-10-CM

## 2024-04-07 DIAGNOSIS — K51419 Inflammatory polyps of colon with unspecified complications: Secondary | ICD-10-CM

## 2024-04-07 DIAGNOSIS — Z794 Long term (current) use of insulin: Secondary | ICD-10-CM

## 2024-04-07 DIAGNOSIS — I10 Essential (primary) hypertension: Secondary | ICD-10-CM | POA: Diagnosis not present

## 2024-04-07 DIAGNOSIS — I729 Aneurysm of unspecified site: Secondary | ICD-10-CM

## 2024-04-07 DIAGNOSIS — L4050 Arthropathic psoriasis, unspecified: Secondary | ICD-10-CM | POA: Diagnosis not present

## 2024-04-07 DIAGNOSIS — D329 Benign neoplasm of meninges, unspecified: Secondary | ICD-10-CM

## 2024-04-07 DIAGNOSIS — J4520 Mild intermittent asthma, uncomplicated: Secondary | ICD-10-CM | POA: Diagnosis not present

## 2024-04-07 DIAGNOSIS — E7800 Pure hypercholesterolemia, unspecified: Secondary | ICD-10-CM | POA: Diagnosis not present

## 2024-04-07 DIAGNOSIS — Z0000 Encounter for general adult medical examination without abnormal findings: Secondary | ICD-10-CM | POA: Diagnosis not present

## 2024-04-07 DIAGNOSIS — K219 Gastro-esophageal reflux disease without esophagitis: Secondary | ICD-10-CM

## 2024-04-07 DIAGNOSIS — Z8673 Personal history of transient ischemic attack (TIA), and cerebral infarction without residual deficits: Secondary | ICD-10-CM

## 2024-04-07 LAB — HM DIABETES FOOT EXAM

## 2024-04-07 MED ORDER — AMLODIPINE BESYLATE 5 MG PO TABS: ORAL_TABLET | ORAL | 1 refills | Status: DC

## 2024-04-07 NOTE — Assessment & Plan Note (Addendum)
 phyisical today 04/07/24.  Mammogram 06/21/23 - Birads I. Colonoscopy 07/2021 - three (5-79mm) polyps in the transverse colon, polyposis - recto sigmoid, sigmoid and ascending colon.

## 2024-04-07 NOTE — Progress Notes (Signed)
 Subjective:    Patient ID: Ashley Gross, female    DOB: [birth year], 73 y.o.   MRN: [id number]  Patient here for  Chief Complaint  Patient presents with   Annual Exam    HPI Here for a physical exam. Was seen UC - 03/14/24 - asthma exacerbation. Treated with azithromycin  and prednisone . Flu and covid negative. Reevaluated 03/20/24 - diagnosed with sinusitis. Treated with augmentin . CXR - ok. Symptoms resolved. Had f/u with Dr Baldomero Bone 03/21/24 - changed entyvio  infusion to subcutaneous injections. Trying to get authorized. Recommended to continue creon . Seeing Dr Randy Buttery for IDA. Last 12/21/23 - hgb and iron stable. Increased stress - family stress and work stress. Discussed. Breathing stable. No increased cough or congestion.    Past Medical History:  Diagnosis Date   Abnormal liver function test 10/06/2012   Anemia 01/25/2022   Anemia, unspecified    Asthma    B12 deficiency 02/11/2019   Bleeding internal hemorrhoids 09/13/2017   Choledocholithiasis 06/29/2012   Formatting of this note might be different from the original. Dilated CBD on abdominal imaging 04/2012   Chronic diarrhea 02/16/2013   Colitis    Complication of anesthesia    asthma attack after shoulder surgery   Diabetes mellitus (HCC)    type II   Esophagitis    GERD (gastroesophageal reflux disease)    Heart murmur    for years- nothing to be concerned about.   History of kidney stones    Hx of arteriovenous malformation (AVM)    Hypertension    IBS (irritable bowel syndrome)    Loss of weight    Peripheral vascular disease (HCC)    Pernicious anemia    Personal history of colonic polyps 02/10/2013   02/08/13 colonoscopy - question of rectal varices, two 3-57mm polyps in the sigmoid colon, mass at the hepatic flexure, one 5mm polyp at the hepatic flexure, nodule at the ileocecal valve, congested mucusa in the entire colon, flattened villi mucosa in the terminal ileum.     Pneumonia    Psoriatic arthritis (HCC)    s/p  penicillamine, plaquenil, MTX, sulfasalazine.  s/p Embrel, Humira,.  Iritis.     Pure hypercholesterolemia    Rectal bleeding 07/15/2017   Stroke (HCC) 05/29/2022   per patient   Past Surgical History:  Procedure Laterality Date   ABDOMINAL HYSTERECTOMY  1981   ovaries left in place   ANKLE SURGERY     right   BUNIONECTOMY     CHOLECYSTECTOMY  1989   COLONOSCOPY WITH PROPOFOL  N/A 12/27/2017   Procedure: COLONOSCOPY WITH PROPOFOL ;  Surgeon: Selena Daily, MD;  Location: [facility];  Service: Gastroenterology;  Laterality: N/A;   COLONOSCOPY WITH PROPOFOL  N/A 01/04/2019   Procedure: COLONOSCOPY WITH PROPOFOL ;  Surgeon: Selena Daily, MD;  Location: [facility];  Service: Endoscopy;  Laterality: N/A;  Diabetic - oral and injectable   COLONOSCOPY WITH PROPOFOL  N/A 07/24/2021   Procedure: COLONOSCOPY WITH PROPOFOL ;  Surgeon: Selena Daily, MD;  Location: [facility];  Service: Endoscopy;  Laterality: N/A;  Diabetic   ESOPHAGOGASTRODUODENOSCOPY (EGD) WITH PROPOFOL  N/A 12/27/2017   Procedure: ESOPHAGOGASTRODUODENOSCOPY (EGD) WITH PROPOFOL ;  Surgeon: Selena Daily, MD;  Location: [facility] END[facility]COPY;  Service: Gastroenterology;  Laterality: N/A;   HAND SURGERY     right   HEEL SPUR SURGERY     IR 3D INDEPENDENT WKST  10/01/2021   IR ANGIO INTRA EXTRACRAN SEL COM CAROTID INNOMINATE UNI L MOD SED  10/01/2021  IR ANGIO INTRA EXTRACRAN SEL INTERNAL CAROTID UNI R MOD SED  10/01/2021   IR ANGIO INTRA EXTRACRAN SEL INTERNAL CAROTID UNI R MOD SED  05/29/2022   IR ANGIO VERTEBRAL SEL VERTEBRAL BILAT MOD SED  10/01/2021   IR ANGIO VERTEBRAL SEL VERTEBRAL UNI R MOD SED  05/29/2022   IR CT HEAD LTD  10/01/2021   IR TRANSCATH/EMBOLIZ  10/01/2021   IR US  GUIDE VASC ACCESS RIGHT  10/01/2021   IR US  GUIDE VASC ACCESS RIGHT  05/29/2022   NOSE SURGERY     x2   POLYPECTOMY  01/04/2019   Procedure: POLYPECTOMY;  Surgeon: Selena Daily, MD;  Location: [facility];   Service: Endoscopy;;   POLYPECTOMY N/A 07/24/2021   Procedure: POLYPECTOMY;  Surgeon: Selena Daily, MD;  Location: [facility];  Service: Endoscopy;  Laterality: N/A;   RADIOLOGY WITH ANESTHESIA N/A 10/01/2021   Procedure: Vania Genin WITH ANESTHESIA;  Surgeon: de Macedo Rodrigues, Katyucia, MD;  Location: [facility] OR;  Service: Radiology;  Laterality: N/A;   SHOULDER ARTHROSCOPY WITH OPEN ROTATOR CUFF REPAIR Left 09/20/2018   Procedure: SHOULDER ARTHROSCOPY WITH OPEN ROTATOR CUFF REPAIR;  Surgeon: Elner Hahn, MD;  Location: [facility] ORS;  Service: Orthopedics;  Laterality: Left;   SHOULDER CLOSED REDUCTION Left 12/21/2018   Procedure: MANIPULATION UNDER ANESTHESIA WITH STEROID INJECTION;  Surgeon: Elner Hahn, MD;  Location: [facility];  Service: Orthopedics;  Laterality: Left;  Diabetic - insulin  and oral meds   UMBILICAL HERNIA REPAIR     XI ROBOTIC ASSISTED INGUINAL HERNIA REPAIR WITH MESH Left 12/16/2021   Procedure: XI ROBOTIC ASSISTED INGUINAL HERNIA REPAIR WITH MESH;  Surgeon: [name] Hare, MD;  Location: [facility] ORS;  Service: General;  Laterality: Left;   Family History  Problem Relation Age of Onset   Diabetes Other    Hypertension Other    Breast cancer Neg Hx    Colon cancer Neg Hx    Social History   Socioeconomic History   Marital status: Widowed    Spouse name: Not on file   Number of children: 2   Years of education: Not on file   Highest education level: Associate degree: occupational, Scientist, product/process development, or vocational program  Occupational History   Not on file  Tobacco Use   Smoking status: Never    Passive exposure: Never   Smokeless tobacco: Never  Vaping Use   Vaping status: Never Used  Substance and Sexual Activity   Alcohol use: Never   Drug use: Never   Sexual activity: Not Currently  Other Topics Concern   Not on file  Social History Narrative   She is married and has two children   Grand daughter lives with her.   Social Drivers of  Health   Financial Resource Strain: Medium Risk (02/10/2024)   Overall Financial Resource Strain (CARDIA)    Difficulty of Paying Living Expenses: Somewhat hard  Food Insecurity: No Food Insecurity (02/10/2024)   Hunger Vital Sign    Worried About Running Out of Food in the Last Year: Never true    Ran Out of Food in the Last Year: Never true  Transportation Needs: No Transportation Needs (02/10/2024)   PRAPARE - Administrator, Civil Service (Medical): No    Lack of Transportation (Non-Medical): No  Physical Activity: Unknown (02/10/2024)   Exercise Vital Sign    Days of Exercise per Week: 0 days    Minutes of Exercise per Session: Not on file  Stress: Stress Concern  Present (02/10/2024)   [employer] of Occupational Health - Occupational Stress Questionnaire    Feeling of Stress : To some extent  Social Connections: Moderately Integrated (02/10/2024)   Social Connection and Isolation Panel [NHANES]    Frequency of Communication with Friends and Family: More than three times a week    Frequency of Social Gatherings with Friends and Family: Once a week    Attends Religious Services: More than 4 times per year    Active Member of [employer] or Organizations: Yes    Attends Banker Meetings: More than 4 times per year    Marital Status: Widowed     Review of Systems  Constitutional:  Negative for appetite change and unexpected weight change.  HENT:  Negative for congestion, sinus pressure and sore throat.   Eyes:  Negative for pain and visual disturbance.  Respiratory:  Negative for cough, chest tightness and shortness of breath.   Cardiovascular:  Negative for chest pain, palpitations and leg swelling.  Gastrointestinal:  Negative for nausea and vomiting.       Persistent bowel issues as outlined.   Genitourinary:  Negative for difficulty urinating and dysuria.  Musculoskeletal:  Negative for joint swelling and myalgias.  Skin:  Negative for color change and  rash.  Neurological:  Negative for dizziness and headaches.  Hematological:  Negative for adenopathy. Does not bruise/bleed easily.  Psychiatric/Behavioral:  Negative for decreased concentration and dysphoric mood.        Objective:     BP 118/74   Pulse 76   Temp 97.9 F (36.6 C)   Resp 16   Ht 5\' 4"  (1.626 m)   Wt 147 lb (66.7 kg)   SpO2 98%   BMI 25.23 kg/m  Wt Readings from Last 3 Encounters:  04/07/24 147 lb (66.7 kg)  03/21/24 145 lb (65.8 kg)  03/20/24 146 lb (66.2 kg)    Physical Exam Vitals reviewed.  Constitutional:      General: She is not in acute distress.    Appearance: Normal appearance. She is well-developed.  HENT:     Head: Normocephalic and atraumatic.     Right Ear: External ear normal.     Left Ear: External ear normal.     Mouth/Throat:     Pharynx: No oropharyngeal exudate or posterior oropharyngeal erythema.  Eyes:     General: No scleral icterus.       Right eye: No discharge.        Left eye: No discharge.     Conjunctiva/sclera: Conjunctivae normal.  Neck:     Thyroid : No thyromegaly.  Cardiovascular:     Rate and Rhythm: Normal rate and regular rhythm.  Pulmonary:     Effort: No tachypnea, accessory muscle usage or respiratory distress.     Breath sounds: Normal breath sounds. No decreased breath sounds or wheezing.  Chest:  Breasts:    Right: No inverted nipple, mass, nipple discharge or tenderness (no axillary adenopathy).     Left: No inverted nipple, mass, nipple discharge or tenderness (no axilarry adenopathy).  Abdominal:     General: Bowel sounds are normal.     Palpations: Abdomen is soft.     Tenderness: There is no abdominal tenderness.  Musculoskeletal:        General: No swelling or tenderness.     Cervical back: Neck supple.  Lymphadenopathy:     Cervical: No cervical adenopathy.  Skin:    Findings: No erythema or rash.  Neurological:  Mental Status: She is alert and oriented to person, place, and time.   Psychiatric:        Mood and Affect: Mood normal.        Behavior: Behavior normal.         Outpatient Encounter Medications as of 04/07/2024  Medication Sig   acetaminophen  (TYLENOL ) 500 MG tablet Take 2 tablets (1,000 mg total) by mouth every 6 (six) hours as needed for mild pain.   albuterol  (VENTOLIN  HFA) 108 (90 Base) MCG/ACT inhaler Inhale 2 puffs into the lungs every 6 (six) hours as needed for wheezing or shortness of breath.   Azelastine  HCl 137 MCG/SPRAY SOLN PLACE 2 SPRAYS INTO BOTH NOSTRILS 2 (TWO) TIMES DAILY. USE IN EACH NOSTRIL AS DIRECTED   cetirizine (ZYRTEC) 10 MG tablet Take 10 mg by mouth daily.   Cholecalciferol  (VITAMIN D ) 50 MCG (2000 UT) tablet Take 2,000 Units by mouth daily.   CREON  36000-114000 units CPEP capsule Take 1-2 capsules (36,000-72,000 Units total) by mouth See admin instructions. Take 720000 units with each meal and 36000 units with snacks   Eszopiclone  3 MG TABS Take 1 tablet (3 mg total) by mouth at bedtime. Take immediately before bedtime   insulin  degludec (TRESIBA  FLEXTOUCH) 100 UNIT/ML FlexTouch Pen Inject 16 Units into the skin daily. (Patient taking differently: Inject 14 Units into the skin daily.)   loperamide  (IMODIUM ) 2 MG capsule Take 4 mg by mouth daily.   losartan  (COZAAR ) 100 MG tablet TAKE 1 TABLET(100 MG) BY MOUTH AT BEDTIME   mometasone  (ELOCON ) 0.1 % ointment Apply 1 Application topically 2 (two) times daily.   omeprazole  (PRILOSEC) 20 MG capsule TAKE 1 CAPSULE(20 MG) BY MOUTH TWICE DAILY   ONE TOUCH ULTRA TEST test strip PATIENT NEEDS NEW METER STRIPS AND LANCETS FOR ONE TOUCH. HER INSURANCE NO LONGER COVERS ACCUCHEK.   potassium chloride  (KLOR-CON  M) 10 MEQ tablet TAKE 1 TABLET BY MOUTH EVERY DAY   rosuvastatin  (CRESTOR ) 10 MG tablet TAKE 1 TABLET BY MOUTH EVERY DAY   vedolizumab  (ENTYVIO ) 300 MG injection Inject 300 mg into the vein 4 (four) times a week.   WIXELA INHUB 100-50 MCG/ACT AEPB INHALE 1 PUFF INTO THE LUNGS TWICE A DAY    [DISCONTINUED] amLODipine  (NORVASC ) 5 MG tablet TAKE 1 TABLET(5 MG) BY MOUTH TWICE DAILY   amLODipine  (NORVASC ) 5 MG tablet TAKE 1 TABLET(5 MG) BY MOUTH TWICE DAILY   [DISCONTINUED] amoxicillin -clavulanate (AUGMENTIN ) 875-125 MG tablet Take 1 tablet by mouth 2 (two) times daily.   No facility-administered encounter medications on file as of 04/07/2024.     Lab Results  Component Value Date   WBC 5.9 02/01/2024   HGB 12.7 02/01/2024   HCT 39.2 02/01/2024   PLT 227.0 02/01/2024   GLUCOSE 134 (H) 04/05/2024   CHOL 161 04/05/2024   TRIG 131.0 04/05/2024   HDL 48.90 04/05/2024   LDLDIRECT 74.0 04/19/2019   LDLCALC 86 04/05/2024   ALT 11 04/05/2024   AST 19 04/05/2024   NA 140 04/05/2024   K 3.7 04/05/2024   CL 107 04/05/2024   CREATININE 0.95 04/05/2024   BUN 17 04/05/2024   CO2 26 04/05/2024   TSH 2.94 04/05/2024   INR 1.1 02/19/2023   HGBA1C 6.3 04/05/2024   MICROALBUR 6.6 (H) 04/30/2023    No results found.     Assessment & Plan:  Routine general medical examination at a health care facility  Healthcare maintenance Assessment & Plan: phyisical today 04/07/24.  Mammogram 06/21/23 - Birads I.  Colonoscopy 07/2021 - three (5-66mm) polyps in the transverse colon, polyposis - recto sigmoid, sigmoid and ascending colon.    Hypercholesterolemia Assessment & Plan: Continue crestor . Follow lipid panel and liver function tests.  Lab Results  Component Value Date   CHOL 161 04/05/2024   HDL 48.90 04/05/2024   LDLCALC 86 04/05/2024   LDLDIRECT 74.0 04/19/2019   TRIG 131.0 04/05/2024   CHOLHDL 3 04/05/2024       Orders: -     Lipid panel; Future -     Hepatic function panel; Future -     Basic metabolic panel with GFR; Future  Type 2 diabetes mellitus with hyperglycemia, with long-term current use of insulin  [facility]) Assessment & Plan: Continues on tresiba . Follow met b and A1c.  Reviewed recent labs.  A1c improved.  Lab Results  Component Value Date   HGBA1C 6.3  04/05/2024     Orders: -     Hemoglobin A1c; Future  Iron deficiency anemia, unspecified iron deficiency anemia type Assessment & Plan: Seeing GI.  entyvio  treatments for inflammatory polyposis.  Seeing hematology - iron infusions.  Follow cbc.    Aneurysm [facility]) Assessment & Plan: Saw Dr Mont Antis [facility] - 01/2023 - At this point, she is asymptomatic. He recommended follow-up imaging. F/u MrI 05/2023 stable.  Recommended f/u MRI scan of the brain as well as MR angiogram of the brain in one year.    Mild intermittent asthma without complication Assessment & Plan: Breathing stable.  No increased cough and congestion.  Continue wixela.  Has duonebs if needed.    Gastroesophageal reflux disease, unspecified whether esophagitis present Assessment & Plan: Continue omeprazole .    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  MRA head and neck were negative.  ECHO - no evidence of embolic source. With history of significant GI bleed in the past due to AVM, polyp bleed.     Primary hypertension Assessment & Plan: On amlodipine  and losartan .  Follow pressures.  Follow metabolic panel. No changes made.    Pseudopolyposis of colon with complication, unspecified part of colon [facility]) Assessment & Plan: S/p colonoscopy 07/24/21 as outlined.  Continues f/u with GI.  On entyvio . Planning to change to injections. Continue f/u with GI.    Meningioma [facility]) Assessment & Plan: Evaluated by [facility].    Psoriatic arthritis (HCC) Assessment & Plan: Has been followed by rheumatology.  Stable.    Stress Assessment & Plan: Increased family and work stress. Discussed. Will notify me if feels needs further intervention.    Other orders -     amLODIPine  Besylate; TAKE 1 TABLET(5 MG) BY MOUTH TWICE DAILY  Dispense: 180 tablet; Refill: 1     Dellar Fenton, MD

## 2024-04-08 ENCOUNTER — Encounter: Payer: Self-pay | Admitting: Internal Medicine

## 2024-04-08 NOTE — Assessment & Plan Note (Signed)
 S/p colonoscopy 07/24/21 as outlined.  Continues f/u with GI.  On entyvio . Planning to change to injections. Continue f/u with GI.

## 2024-04-08 NOTE — Assessment & Plan Note (Signed)
 Seeing GI.  entyvio  treatments for inflammatory polyposis.  Seeing hematology - iron infusions.  Follow cbc.

## 2024-04-08 NOTE — Assessment & Plan Note (Signed)
 Saw Dr Mont Antis [facility] - 01/2023 - At this point, she is asymptomatic. He recommended follow-up imaging. F/u MrI 05/2023 stable.  Recommended f/u MRI scan of the brain as well as MR angiogram of the brain in one year.

## 2024-04-08 NOTE — Assessment & Plan Note (Signed)
 Continues on tresiba . Follow met b and A1c.  Reviewed recent labs.  A1c improved.  Lab Results  Component Value Date   HGBA1C 6.3 04/05/2024

## 2024-04-08 NOTE — Assessment & Plan Note (Signed)
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  MRA head and neck were negative.  ECHO - no evidence of embolic source. With history of significant GI bleed in the past due to AVM, polyp bleed.   

## 2024-04-08 NOTE — Assessment & Plan Note (Signed)
 Continue omeprazole

## 2024-04-08 NOTE — Assessment & Plan Note (Signed)
 Evaluated by [facility].

## 2024-04-08 NOTE — Assessment & Plan Note (Signed)
 On amlodipine  and losartan .  Follow pressures.  Follow metabolic panel. No changes made.

## 2024-04-08 NOTE — Assessment & Plan Note (Addendum)
 Continue crestor . Follow lipid panel and liver function tests.  Lab Results  Component Value Date   CHOL 161 04/05/2024   HDL 48.90 04/05/2024   LDLCALC 86 04/05/2024   LDLDIRECT 74.0 04/19/2019   TRIG 131.0 04/05/2024   CHOLHDL 3 04/05/2024

## 2024-04-08 NOTE — Assessment & Plan Note (Signed)
 Increased family and work stress. Discussed. Will notify me if feels needs further intervention.

## 2024-04-08 NOTE — Assessment & Plan Note (Signed)
 Breathing stable.  No increased cough and congestion.  Continue wixela.  Has duonebs if needed.

## 2024-04-08 NOTE — Assessment & Plan Note (Signed)
 Has been followed by rheumatology.  Stable.

## 2024-04-10 ENCOUNTER — Other Ambulatory Visit: Payer: Self-pay | Admitting: Internal Medicine

## 2024-04-10 NOTE — Telephone Encounter (Unsigned)
 Copied from CRM [id number]. Topic: Clinical - Prescription Issue >> Apr 10, 2024 11:11 AM [place] E wrote: Reason for CRM: Patient went into pharmacy on 6/7 to pick up her rosuvastatin  (CRESTOR ) 10 MG tablet, pharmacy stated they did not have this medication when it was sent in on 6/3. Transmission to pharmacy failed on 6/3, but was re-sent that same day. Callback number for patient is [phone].

## 2024-04-11 ENCOUNTER — Other Ambulatory Visit: Payer: Self-pay

## 2024-04-11 ENCOUNTER — Telehealth: Payer: Self-pay

## 2024-04-11 MED ORDER — ROSUVASTATIN CALCIUM 10 MG PO TABS: 10.0000 mg | ORAL_TABLET | Freq: Every day | ORAL | 3 refills | Status: AC

## 2024-04-11 NOTE — Telephone Encounter (Signed)
 Copied from CRM [id number]. Topic: Clinical - Prescription Issue >> Apr 10, 2024 11:11 AM [place] E wrote: Reason for CRM: Patient went into pharmacy on 6/7 to pick up her rosuvastatin  (CRESTOR ) 10 MG tablet, pharmacy stated they did not have this medication when it was sent in on 6/3. Transmission to pharmacy failed on 6/3, but was re-sent that same day. Callback number for patient is [phone]. >> Apr 11, 2024 11:58 AM Magdalene School wrote: Patient calling to check on status of prescription and stated that she will be going out of town on Thursday 04/13/24 and needs this medication filled before then. She stated that she contacted the pharmacy and they still do not have the prescription and she has been out of this medication for over a week.

## 2024-04-11 NOTE — Telephone Encounter (Signed)
 Resent rx for cholesterol medication. Patient is aware.

## 2024-04-15 DIAGNOSIS — G4733 Obstructive sleep apnea (adult) (pediatric): Secondary | ICD-10-CM | POA: Diagnosis not present

## 2024-04-19 ENCOUNTER — Other Ambulatory Visit: Payer: PPO

## 2024-04-21 ENCOUNTER — Ambulatory Visit
Admission: RE | Admit: 2024-04-21 | Discharge: 2024-04-21 | Disposition: A | Discharge status: Home / Self Care | Source: Ambulatory Visit | Attending: Gastroenterology | Admitting: Gastroenterology

## 2024-04-21 ENCOUNTER — Inpatient Hospital Stay: Payer: PPO | Attending: Gastroenterology

## 2024-04-21 DIAGNOSIS — K51419 Inflammatory polyps of colon with unspecified complications: Secondary | ICD-10-CM | POA: Diagnosis not present

## 2024-04-21 MED ORDER — VEDOLIZUMAB 300 MG IV SOLR
300.0000 mg | Freq: Once | INTRAVENOUS | Status: AC
  Administered 2024-04-21: 300 mg via INTRAVENOUS
  Filled 2024-04-21: qty 5

## 2024-04-26 ENCOUNTER — Ambulatory Visit
Admission: RE | Admit: 2024-04-26 | Discharge: 2024-04-26 | Disposition: A | Discharge status: Home / Self Care | Source: Ambulatory Visit | Attending: Neurosurgery | Admitting: Neurosurgery

## 2024-04-26 ENCOUNTER — Encounter: Payer: Self-pay | Admitting: Oncology

## 2024-04-26 DIAGNOSIS — D352 Benign neoplasm of pituitary gland: Secondary | ICD-10-CM | POA: Diagnosis not present

## 2024-04-26 DIAGNOSIS — E237 Disorder of pituitary gland, unspecified: Secondary | ICD-10-CM

## 2024-04-26 DIAGNOSIS — I671 Cerebral aneurysm, nonruptured: Secondary | ICD-10-CM

## 2024-04-26 DIAGNOSIS — D320 Benign neoplasm of cerebral meninges: Secondary | ICD-10-CM | POA: Diagnosis not present

## 2024-04-26 MED ORDER — GADOPICLENOL 0.5 MMOL/ML IV SOLN
7.5000 mL | Freq: Once | INTRAVENOUS | Status: AC | PRN
  Administered 2024-04-26: 7.5 mL via INTRAVENOUS

## 2024-04-28 ENCOUNTER — Ambulatory Visit (INDEPENDENT_AMBULATORY_CARE_PROVIDER_SITE_OTHER): Admitting: *Deleted

## 2024-04-28 VITALS — Ht 64.0 in | Wt 148.0 lb

## 2024-04-28 DIAGNOSIS — Z0000 Encounter for general adult medical examination without abnormal findings: Secondary | ICD-10-CM | POA: Diagnosis not present

## 2024-04-28 DIAGNOSIS — Z780 Asymptomatic menopausal state: Secondary | ICD-10-CM

## 2024-04-28 NOTE — Progress Notes (Signed)
 Subjective:   Ashley Gross is a 73 y.o. who presents for a Medicare Wellness preventive visit.  As a reminder, Annual Wellness Visits don't include a physical exam, and some assessments may be limited, especially if this visit is performed virtually. We may recommend an in-person follow-up visit with your provider if needed.  Visit Complete: Virtual I connected with  Ashley Gross on 04/28/24 by a audio enabled telemedicine application and verified that I am speaking with the correct person using two identifiers.  Patient Location: Home  Provider Location: Home Office  I discussed the limitations of evaluation and management by telemedicine. The patient expressed understanding and agreed to proceed.  Vital Signs: Because this visit was a virtual/telehealth visit, some criteria may be missing or patient reported. Any vitals not documented were not able to be obtained and vitals that have been documented are patient reported.  VideoDeclined- This patient declined Librarian, academic. Therefore the visit was completed with audio only.  Persons Participating in Visit: Patient.  AWV Questionnaire: Yes: Patient Medicare AWV questionnaire was completed by the patient on 04/24/24; I have confirmed that all information answered by patient is correct and no changes since this date.  Cardiac Risk Factors include: advanced age (>79men, >42 women);diabetes mellitus;dyslipidemia;hypertension     Objective:    Today's Vitals   04/28/24 0814  Weight: 148 lb (67.1 kg)  Height: 5' 4 (1.626 m)   Body mass index is 25.4 kg/m.     04/28/2024    8:27 AM 03/14/2024   11:51 AM 12/21/2023    1:24 PM 06/18/2023    2:20 PM 02/19/2023    3:59 AM 02/18/2023    8:48 PM 07/02/2022   11:43 AM  Advanced Directives  Does Patient Have a Medical Advance Directive? No No No No  No Yes  Type of Theme park manager;Living will   Healthcare Power of  Attorney  Does patient want to make changes to medical advance directive?   No - Patient declined      Would patient like information on creating a medical advance directive? No - Patient declined  No - Patient declined No - Patient declined No - Patient declined      Current Medications (verified) Outpatient Encounter Medications as of 04/28/2024  Medication Sig   acetaminophen  (TYLENOL ) 500 MG tablet Take 2 tablets (1,000 mg total) by mouth every 6 (six) hours as needed for mild pain.   albuterol  (VENTOLIN  HFA) 108 (90 Base) MCG/ACT inhaler Inhale 2 puffs into the lungs every 6 (six) hours as needed for wheezing or shortness of breath.   amLODipine  (NORVASC ) 5 MG tablet TAKE 1 TABLET(5 MG) BY MOUTH TWICE DAILY   Azelastine  HCl 137 MCG/SPRAY SOLN PLACE 2 SPRAYS INTO BOTH NOSTRILS 2 (TWO) TIMES DAILY. USE IN EACH NOSTRIL AS DIRECTED   cetirizine (ZYRTEC) 10 MG tablet Take 10 mg by mouth daily.   Cholecalciferol  (VITAMIN D ) 50 MCG (2000 UT) tablet Take 2,000 Units by mouth daily.   CREON  36000-114000 units CPEP capsule Take 1-2 capsules (36,000-72,000 Units total) by mouth See admin instructions. Take 720000 units with each meal and 36000 units with snacks   Eszopiclone  3 MG TABS Take 1 tablet (3 mg total) by mouth at bedtime. Take immediately before bedtime   insulin  degludec (TRESIBA  FLEXTOUCH) 100 UNIT/ML FlexTouch Pen Inject 16 Units into the skin daily.   loperamide  (IMODIUM ) 2 MG capsule Take 4 mg by mouth daily.  losartan  (COZAAR ) 100 MG tablet TAKE 1 TABLET(100 MG) BY MOUTH AT BEDTIME   mometasone  (ELOCON ) 0.1 % ointment Apply 1 Application topically 2 (two) times daily.   omeprazole  (PRILOSEC) 20 MG capsule TAKE 1 CAPSULE(20 MG) BY MOUTH TWICE DAILY   ONE TOUCH ULTRA TEST test strip PATIENT NEEDS NEW METER STRIPS AND LANCETS FOR ONE TOUCH. HER INSURANCE NO LONGER COVERS ACCUCHEK.   potassium chloride  (KLOR-CON  M) 10 MEQ tablet TAKE 1 TABLET BY MOUTH EVERY DAY   rosuvastatin  (CRESTOR )  10 MG tablet Take 1 tablet (10 mg total) by mouth daily.   vedolizumab  (ENTYVIO ) 300 MG injection Inject 300 mg into the vein 4 (four) times a week.   WIXELA INHUB 100-50 MCG/ACT AEPB INHALE 1 PUFF INTO THE LUNGS TWICE A DAY   No facility-administered encounter medications on file as of 04/28/2024.    Allergies (verified) Aspirin , Beta adrenergic blockers, Lisinopril , Mtx support [cobalamine combinations], Nsaids, Vasotec [enalapril], Verapamil , Voltaren [diclofenac sodium], Erythromycin , Indocin [indomethacin], and Jardiance  [empagliflozin ]   History: Past Medical History:  Diagnosis Date   Abnormal liver function test 10/06/2012   Anemia 01/25/2022   Anemia, unspecified    Asthma    B12 deficiency 02/11/2019   Bleeding internal hemorrhoids 09/13/2017   Choledocholithiasis 06/29/2012   Formatting of this note might be different from the original. Dilated CBD on abdominal imaging 04/2012   Chronic diarrhea 02/16/2013   Colitis    Complication of anesthesia    asthma attack after shoulder surgery   Diabetes mellitus (HCC)    type II   Esophagitis    GERD (gastroesophageal reflux disease)    Heart murmur    for years- nothing to be concerned about.   History of kidney stones    Hx of arteriovenous malformation (AVM)    Hypertension    IBS (irritable bowel syndrome)    Loss of weight    Peripheral vascular disease (HCC)    Pernicious anemia    Personal history of colonic polyps 02/10/2013   02/08/13 colonoscopy - question of rectal varices, two 3-66mm polyps in the sigmoid colon, mass at the hepatic flexure, one 5mm polyp at the hepatic flexure, nodule at the ileocecal valve, congested mucusa in the entire colon, flattened villi mucosa in the terminal ileum.     Pneumonia    Psoriatic arthritis (HCC)    s/p penicillamine, plaquenil, MTX, sulfasalazine.  s/p Embrel, Humira,.  Iritis.     Pure hypercholesterolemia    Rectal bleeding 07/15/2017   Stroke (HCC) 05/29/2022   per  patient   Past Surgical History:  Procedure Laterality Date   ABDOMINAL HYSTERECTOMY  1981   ovaries left in place   ANKLE SURGERY     right   BUNIONECTOMY     CHOLECYSTECTOMY  1989   COLONOSCOPY WITH PROPOFOL  N/A 12/27/2017   Procedure: COLONOSCOPY WITH PROPOFOL ;  Surgeon: Unk Corinn Skiff, MD;  Location: [facility];  Service: Gastroenterology;  Laterality: N/A;   COLONOSCOPY WITH PROPOFOL  N/A 01/04/2019   Procedure: COLONOSCOPY WITH PROPOFOL ;  Surgeon: Unk Corinn Skiff, MD;  Location: [facility];  Service: Endoscopy;  Laterality: N/A;  Diabetic - oral and injectable   COLONOSCOPY WITH PROPOFOL  N/A 07/24/2021   Procedure: COLONOSCOPY WITH PROPOFOL ;  Surgeon: Unk Corinn Skiff, MD;  Location: [facility];  Service: Endoscopy;  Laterality: N/A;  Diabetic   ESOPHAGOGASTRODUODENOSCOPY (EGD) WITH PROPOFOL  N/A 12/27/2017   Procedure: ESOPHAGOGASTRODUODENOSCOPY (EGD) WITH PROPOFOL ;  Surgeon: Unk Corinn Skiff, MD;  Location: [facility] END[facility]COPY;  Service:  Gastroenterology;  Laterality: N/A;   HAND SURGERY     right   HEEL SPUR SURGERY     IR 3D INDEPENDENT WKST  10/01/2021   IR ANGIO INTRA EXTRACRAN SEL COM CAROTID INNOMINATE UNI L MOD SED  10/01/2021   IR ANGIO INTRA EXTRACRAN SEL INTERNAL CAROTID UNI R MOD SED  10/01/2021   IR ANGIO INTRA EXTRACRAN SEL INTERNAL CAROTID UNI R MOD SED  05/29/2022   IR ANGIO VERTEBRAL SEL VERTEBRAL BILAT MOD SED  10/01/2021   IR ANGIO VERTEBRAL SEL VERTEBRAL UNI R MOD SED  05/29/2022   IR CT HEAD LTD  10/01/2021   IR TRANSCATH/EMBOLIZ  10/01/2021   IR US  GUIDE VASC ACCESS RIGHT  10/01/2021   IR US  GUIDE VASC ACCESS RIGHT  05/29/2022   NOSE SURGERY     x2   POLYPECTOMY  01/04/2019   Procedure: POLYPECTOMY;  Surgeon: Unk Corinn Skiff, MD;  Location: [facility];  Service: Endoscopy;;   POLYPECTOMY N/A 07/24/2021   Procedure: POLYPECTOMY;  Surgeon: Unk Corinn Skiff, MD;  Location: [facility];  Service: Endoscopy;   Laterality: N/A;   RADIOLOGY WITH ANESTHESIA N/A 10/01/2021   Procedure: HENDERSON WITH ANESTHESIA;  Surgeon: de Macedo Rodrigues, Katyucia, MD;  Location[facility];  Service: Radiology;  Laterality: N/A;   SHOULDER ARTHROSCOPY WITH OPEN ROTATOR CUFF REPAIR Left 09/20/2018   Procedure: SHOULDER ARTHROSCOPY WITH OPEN ROTATOR CUFF REPAIR;  Surgeon: Edie Norleen PARAS, MD;  Location: [facility] ORS;  Service: Orthopedics;  Laterality: Left;   SHOULDER CLOSED REDUCTION Left 12/21/2018   Procedure: MANIPULATION UNDER ANESTHESIA WITH STEROID INJECTION;  Surgeon: Edie Norleen PARAS, MD;  Location: [facility];  Service: Orthopedics;  Laterality: Left;  Diabetic - insulin  and oral meds   UMBILICAL HERNIA REPAIR     XI ROBOTIC ASSISTED INGUINAL HERNIA REPAIR WITH MESH Left 12/16/2021   Procedure: XI ROBOTIC ASSISTED INGUINAL HERNIA REPAIR WITH MESH;  Surgeon: Desiderio Schanz, MD;  Location: [facility] ORS;  Service: General;  Laterality: Left;   Family History  Problem Relation Age of Onset   Diabetes Other    Hypertension Other    Breast cancer Neg Hx    Colon cancer Neg Hx    Social History   Socioeconomic History   Marital status: Widowed    Spouse name: Not on file   Number of children: 2   Years of education: Not on file   Highest education level: Associate degree: occupational, Scientist, product/process development, or vocational program  Occupational History   Not on file  Tobacco Use   Smoking status: Never    Passive exposure: Never   Smokeless tobacco: Never  Vaping Use   Vaping status: Never Used  Substance and Sexual Activity   Alcohol use: Never   Drug use: Never   Sexual activity: Not Currently  Other Topics Concern   Not on file  Social History Narrative   She is married and has two children   Grand daughter lives with her.   Social Drivers of Health   Financial Resource Strain: Medium Risk (04/24/2024)   Overall Financial Resource Strain (CARDIA)    Difficulty of Paying Living Expenses: Somewhat hard  Food  Insecurity: Food Insecurity Present (04/24/2024)   Hunger Vital Sign    Worried About Running Out of Food in the Last Year: Sometimes true    Ran Out of Food in the Last Year: Never true  Transportation Needs: No Transportation Needs (04/24/2024)   PRAPARE - Transportation    Lack of  Transportation (Medical): No    Lack of Transportation (Non-Medical): No  Physical Activity: Insufficiently Active (04/24/2024)   Exercise Vital Sign    Days of Exercise per Week: 2 days    Minutes of Exercise per Session: 20 min  Stress: Stress Concern Present (04/24/2024)   [employer] of Occupational Health - Occupational Stress Questionnaire    Feeling of Stress: To some extent  Social Connections: Moderately Integrated (04/24/2024)   Social Connection and Isolation Panel    Frequency of Communication with Friends and Family: More than three times a week    Frequency of Social Gatherings with Friends and Family: More than three times a week    Attends Religious Services: More than 4 times per year    Active Member of [employer] or Organizations: Yes    Attends Banker Meetings: More than 4 times per year    Marital Status: Widowed    Tobacco Counseling Counseling given: Not Answered    Clinical Intake:  Pre-visit preparation completed: Yes  Pain : No/denies pain     BMI - recorded: 25.4 Nutritional Status: BMI 25 -29 Overweight Nutritional Risks: None Diabetes: Yes CBG done?: No Did pt. bring in CBG monitor from home?: No  Lab Results  Component Value Date   HGBA1C 6.3 04/05/2024   HGBA1C 7.6 (H) 12/15/2023   HGBA1C 6.8 (H) 09/08/2023     How often do you need to have someone help you when you read instructions, pamphlets, or other written materials from your doctor or pharmacy?: 1 - Never  Interpreter Needed?: No  Information entered by :: R. Collen Vincent LPN   Activities of Daily Living     04/28/2024    8:16 AM 04/28/2024    8:15 AM  In your present state of health,  do you have any difficulty performing the following activities:  Hearing? 0 0  Vision? 0 0  Difficulty concentrating or making decisions? 0 0  Walking or climbing stairs? 0 0  Dressing or bathing? 0 0  Doing errands, shopping? 0 0  Preparing Food and eating ? N N  Using the Toilet? N N  In the past six months, have you accidently leaked urine? Y N  Do you have problems with loss of bowel control? Y N  Managing your Medications? N N  Managing your Finances? N N  Housekeeping or managing your Housekeeping? N N    Patient Care Team: Glendia Shad, MD as PCP - General (Internal Medicine) Melanee Annah BROCKS, MD as Consulting Physician (Oncology) Geronimo Manuelita SAUNDERS, [facility] (Pharmacist) Malachy Comer GAILS, NP as Nurse Practitioner (Nurse Practitioner)  I have updated your Care Teams any recent Medical Services you may have received from other providers in the past year.     Assessment:   This is a routine wellness examination for Ashley Gross.  Hearing/Vision screen Hearing Screening - Comments:: No issues Vision Screening - Comments:: glasses   Goals Addressed             This Visit's Progress    Patient Stated       Wants to keep her job       Depression Screen     04/28/2024    8:22 AM 03/20/2024    1:20 PM 02/01/2024   11:51 AM 12/03/2023    4:28 PM 09/10/2023    1:11 PM 07/19/2023    1:39 PM 05/07/2023    9:16 AM  PHQ 2/9 Scores  PHQ - 2 Score 1 0 0 2  3 0 0  PHQ- 9 Score 5 5 6 6 7  0 6    Fall Risk     04/28/2024    8:17 AM 03/20/2024    1:20 PM 02/01/2024   11:51 AM 12/03/2023    4:28 PM 09/10/2023    1:10 PM  Fall Risk   Falls in the past year? 1 0 0 0 0  Comment tripped over raised tile      Number falls in past yr: 0 0 0 0 0  Injury with Fall? 0 0 0 0 0  Risk for fall due to : History of fall(s);Impaired balance/gait No Fall Risks No Fall Risks No Fall Risks No Fall Risks  Follow up Falls evaluation completed;Falls prevention discussed Falls evaluation completed Falls  evaluation completed;Education provided Falls evaluation completed Falls evaluation completed    MEDICARE RISK AT HOME:  Medicare Risk at Home Any stairs in or around the home?: Yes If so, are there any without handrails?: No Home free of loose throw rugs in walkways, pet beds, electrical cords, etc?: Yes Adequate lighting in your home to reduce risk of falls?: Yes Life alert?: No Use of a cane, walker or w/c?: No Grab bars in the bathroom?: Yes Shower chair or bench in shower?: No Elevated toilet seat or a handicapped toilet?: No  TIMED UP AND GO:  Was the test performed?  No  Cognitive Function: 6CIT completed    05/17/2017   11:50 AM  MMSE - Mini Mental State Exam  Orientation to time 5   Orientation to Place 5   Registration 3   Attention/ Calculation 5   Recall 3   Language- name 2 objects 2   Language- repeat 1  Language- follow 3 step command 3   Language- read & follow direction 1   Write a sentence 1   Copy design 1   Total score 30      Data saved with a previous flowsheet row definition        04/28/2024    8:28 AM  6CIT Screen  What Year? 0 points  What month? 0 points  What time? 0 points  Count back from 20 0 points  Months in reverse 0 points  Repeat phrase 0 points  Total Score 0 points    Immunizations Immunization History  Administered Date(s) Administered   Fluad Quad(high Dose 65+) 07/20/2019, 07/23/2020, 07/24/2021, 08/13/2022   Fluad Trivalent(High Dose 65+) 07/22/2023   Influenza Whole 07/03/2012   Influenza, High Dose Seasonal PF 07/12/2017, 08/01/2018   Influenza, Seasonal, Injecte, Preservative Fre 09/04/2013   Influenza-Unspecified 07/17/2014, 07/31/2015, 07/15/2016   PFIZER Comirnaty(Gray Top)Covid-19 Tri-Sucrose Vaccine 03/25/2021, 08/20/2023   PFIZER(Purple Top)SARS-COV-2 Vaccination 12/13/2019, 01/03/2020, 08/15/2020   Pneumococcal Conjugate-13 05/17/2017   Pneumococcal Polysaccharide-23 11/13/2020    Screening  Tests Health Maintenance  Topic Date Due   Zoster Vaccines- Shingrix (1 of 2) Never done   Diabetic kidney evaluation - Urine ACR  04/29/2024   COVID-19 Vaccine (6 - 2024-25 season) 12/02/2024 (Originally 10/15/2023)   Hepatitis C Screening  12/16/2024 (Originally 01/24/1969)   OPHTHALMOLOGY EXAM  05/03/2024   INFLUENZA VACCINE  06/02/2024   HEMOGLOBIN A1C  10/05/2024   Diabetic kidney evaluation - eGFR measurement  04/05/2025   FOOT EXAM  04/07/2025   Medicare Annual Wellness (AWV)  04/28/2025   MAMMOGRAM  06/20/2025   Colonoscopy  07/25/2031   Pneumococcal Vaccine: 50+ Years  Completed   DEXA SCAN  Completed   Hepatitis B Vaccines  Aged  Out   HPV VACCINES  Aged Out   Meningococcal B Vaccine  Aged Out   DTaP/Tdap/Td  Discontinued    Health Maintenance  Health Maintenance Due  Topic Date Due   Zoster Vaccines- Shingrix (1 of 2) Never done   Diabetic kidney evaluation - Urine ACR  04/29/2024   Health Maintenance Items Addressed: DEXA ordered Discussed the need to update shingles and tetanus (Tdap) vaccines  Additional Screening:  Vision Screening: Recommended annual ophthalmology exams for early detection of glaucoma and other disorders of the eye. Up to date [facility] Would you like a referral to an eye doctor? No    Dental Screening: Recommended annual dental exams for proper oral hygiene  Community Resource Referral / Chronic Care Management: CRR required this visit?  No   CCM required this visit?  No   Plan:    I have personally reviewed and noted the following in the patient's chart:   Medical and social history Use of alcohol, tobacco or illicit drugs  Current medications and supplements including opioid prescriptions. Patient is not currently taking opioid prescriptions. Functional ability and status Nutritional status Physical activity Advanced directives List of other physicians Hospitalizations, surgeries, and ER visits in previous 12  months Vitals Screenings to include cognitive, depression, and falls Referrals and appointments  In addition, I have reviewed and discussed with patient certain preventive protocols, quality metrics, and best practice recommendations. A written personalized care plan for preventive services as well as general preventive health recommendations were provided to patient.   Angeline Fredericks, LPN   [id number]/72/[id number]   After Visit Summary: (MyChart) Due to this being a telephonic visit, the after visit summary with patients personalized plan was offered to patient via MyChart   Notes: Nothing significant to report at this time.

## 2024-04-28 NOTE — Patient Instructions (Signed)
 Ashley Gross , Thank you for taking time out of your busy schedule to complete your Annual Wellness Visit with me. I enjoyed our conversation and look forward to speaking with you again next year. I, as well as your care team,  appreciate your ongoing commitment to your health goals. Please review the following plan we discussed and let me know if I can assist you in the future. Your Game plan/ To Do List    Referrals: If you haven't heard from the office you've been referred to, please reach out to them at the phone provided.  Order has been placed for your Dexa/Bone density test. Remember to update your shingles and Tetanus (Tdap) vaccines at your pharmacy. You have an order for:  []   2D Mammogram  []   3D Mammogram  [x]   Bone Density     Please call for appointment:  [facility]  [address] [place] Ashley Gross [phone]     Make sure to wear two-piece clothing.  No lotions, powders, or deodorants the day of the appointment. Make sure to bring picture ID and insurance card.  Bring list of medications you are currently taking including any supplements.   Follow up Visits: Next Medicare AWV with our clinical staff: 05/11/25 @ 8:50   Have you seen your provider in the last 6 months (3 months if uncontrolled diabetes)? Yes Next Office Visit with your provider: 08/11/24  Clinician Recommendations:  Aim for 30 minutes of exercise or brisk walking, 6-8 glasses of water , and 5 servings of fruits and vegetables each day.       This is a list of the screening recommended for you and due dates:  Health Maintenance  Topic Date Due   Zoster (Shingles) Vaccine (1 of 2) Never done   Yearly kidney health urinalysis for diabetes  04/29/2024   COVID-19 Vaccine (6 - 2024-25 season) 12/02/2024*   Hepatitis C Screening  12/16/2024*   Eye exam for diabetics  05/03/2024   Flu Shot  06/02/2024   Hemoglobin A1C  10/05/2024   Yearly kidney  function blood test for diabetes  04/05/2025   Complete foot exam   04/07/2025   Medicare Annual Wellness Visit  04/28/2025   Mammogram  06/20/2025   Colon Cancer Screening  07/25/2031   Pneumococcal Vaccine for age over 36  Completed   DEXA scan (bone density measurement)  Completed   Hepatitis B Vaccine  Aged Out   HPV Vaccine  Aged Out   Meningitis B Vaccine  Aged Out   DTaP/Tdap/Td vaccine  Discontinued  *Topic was postponed. The date shown is not the original due date.    Advanced directives: (Declined) Advance directive discussed with you today. Even though you declined this today, please call our office should you change your mind, and we can give you the proper paperwork for you to fill out. Wants to get paperwork at the office. Advance Care Planning is important because it:  [x]  Makes sure you receive the medical care that is consistent with your values, goals, and preferences  [x]  It provides guidance to your family and loved ones and reduces their decisional burden about whether or not they are making the right decisions based on your wishes.

## 2024-04-29 ENCOUNTER — Encounter (HOSPITAL_COMMUNITY): Payer: Self-pay | Admitting: Interventional Radiology

## 2024-05-01 ENCOUNTER — Telehealth: Payer: Self-pay

## 2024-05-01 NOTE — Telephone Encounter (Signed)
 PAP: Patient assistance application for Tresiba  through [employer] has been mailed to pt's home address on file. Provider portion of application will be faxed to provider's office. Patient portion e-filed. Current enrollment ends 05/29/24

## 2024-05-03 ENCOUNTER — Other Ambulatory Visit: Payer: Self-pay

## 2024-05-03 DIAGNOSIS — E1121 Type 2 diabetes mellitus with diabetic nephropathy: Secondary | ICD-10-CM

## 2024-05-04 ENCOUNTER — Emergency Department: Admission: EM | Admit: 2024-05-04 | Discharge: 2024-05-04 | Disposition: A | Discharge status: Home / Self Care

## 2024-05-04 ENCOUNTER — Emergency Department

## 2024-05-04 ENCOUNTER — Encounter: Payer: Self-pay | Admitting: *Deleted

## 2024-05-04 ENCOUNTER — Ambulatory Visit: Payer: Self-pay

## 2024-05-04 ENCOUNTER — Ambulatory Visit: Admission: EM | Admit: 2024-05-04 | Discharge: 2024-05-04

## 2024-05-04 ENCOUNTER — Other Ambulatory Visit: Payer: Self-pay

## 2024-05-04 DIAGNOSIS — Z8673 Personal history of transient ischemic attack (TIA), and cerebral infarction without residual deficits: Secondary | ICD-10-CM

## 2024-05-04 DIAGNOSIS — I10 Essential (primary) hypertension: Secondary | ICD-10-CM | POA: Insufficient documentation

## 2024-05-04 DIAGNOSIS — R531 Weakness: Secondary | ICD-10-CM

## 2024-05-04 DIAGNOSIS — I671 Cerebral aneurysm, nonruptured: Secondary | ICD-10-CM | POA: Insufficient documentation

## 2024-05-04 DIAGNOSIS — R519 Headache, unspecified: Secondary | ICD-10-CM

## 2024-05-04 DIAGNOSIS — E119 Type 2 diabetes mellitus without complications: Secondary | ICD-10-CM | POA: Insufficient documentation

## 2024-05-04 DIAGNOSIS — I6529 Occlusion and stenosis of unspecified carotid artery: Secondary | ICD-10-CM | POA: Diagnosis not present

## 2024-05-04 DIAGNOSIS — G4489 Other headache syndrome: Secondary | ICD-10-CM | POA: Diagnosis not present

## 2024-05-04 DIAGNOSIS — E236 Other disorders of pituitary gland: Secondary | ICD-10-CM | POA: Diagnosis not present

## 2024-05-04 LAB — BASIC METABOLIC PANEL WITH GFR
Anion gap: 11 (ref 5–15)
BUN: 19 mg/dL (ref 8–23)
CO2: 25 mmol/L (ref 22–32)
Calcium: 8.8 mg/dL — ABNORMAL LOW (ref 8.9–10.3)
Chloride: 106 mmol/L (ref 98–111)
Creatinine, Ser: 0.83 mg/dL (ref 0.44–1.00)
GFR, Estimated: >60 mL/min (ref >60)
Glucose, Bld: 115 mg/dL — ABNORMAL HIGH (ref 70–99)
Potassium: 4.1 mmol/L (ref 3.5–5.1)
Sodium: 142 mmol/L (ref 135–145)

## 2024-05-04 LAB — CBC WITH DIFFERENTIAL/PLATELET
Abs Immature Granulocytes: 0.01 10*3/uL (ref 0.00–0.07)
Basophils Absolute: 0.1 10*3/uL (ref 0.0–0.1)
Basophils Relative: 1 %
Eosinophils Absolute: 0.1 10*3/uL (ref 0.0–0.5)
Eosinophils Relative: 2 %
HCT: 43.8 % (ref 36.0–46.0)
Hemoglobin: 14 g/dL (ref 12.0–15.0)
Immature Granulocytes: 0 %
Lymphocytes Relative: 39 %
Lymphs Abs: 2.5 10*3/uL (ref 0.7–4.0)
MCH: 29.5 pg (ref 26.0–34.0)
MCHC: 32 g/dL (ref 30.0–36.0)
MCV: 92.2 fL (ref 80.0–100.0)
Monocytes Absolute: 0.5 10*3/uL (ref 0.1–1.0)
Monocytes Relative: 7 %
Neutro Abs: 3.3 10*3/uL (ref 1.7–7.7)
Neutrophils Relative %: 51 %
Platelets: 250 10*3/uL (ref 150–400)
RBC: 4.75 MIL/uL (ref 3.87–5.11)
RDW: 12.4 % (ref 11.5–15.5)
WBC: 6.4 10*3/uL (ref 4.0–10.5)
nRBC: 0 % (ref 0.0–0.2)

## 2024-05-04 LAB — TROPONIN I (HIGH SENSITIVITY)
Troponin I (High Sensitivity): 8 ng/L (ref <18)
Troponin I (High Sensitivity): 8 ng/L (ref <18)

## 2024-05-04 LAB — PROTIME-INR
INR: 0.9 (ref 0.8–1.2)
Prothrombin Time: 13.1 s (ref 11.4–15.2)

## 2024-05-04 MED ORDER — IOHEXOL 350 MG/ML SOLN
75.0000 mL | Freq: Once | INTRAVENOUS | Status: AC | PRN
  Administered 2024-05-04: 75 mL via INTRAVENOUS

## 2024-05-04 MED ORDER — METOCLOPRAMIDE HCL 5 MG/ML IJ SOLN
10.0000 mg | Freq: Once | INTRAMUSCULAR | Status: AC
  Administered 2024-05-04: 10 mg via INTRAVENOUS
  Filled 2024-05-04: qty 2

## 2024-05-04 MED ORDER — DIPHENHYDRAMINE HCL 50 MG/ML IJ SOLN
25.0000 mg | Freq: Once | INTRAMUSCULAR | Status: AC
  Administered 2024-05-04: 25 mg via INTRAVENOUS
  Filled 2024-05-04: qty 1

## 2024-05-04 NOTE — ED Triage Notes (Signed)
 Patient states she has had a headache for over 1 week that's frontal and her BP has been elevated for nearly 1 week.  Today BP was 181/89 HR 58 at home today, normally she runs 120s-130s/60s/70s.  No missed med doses but under more stress

## 2024-05-04 NOTE — Telephone Encounter (Signed)
 Copied from CRM [id number]. Topic: Clinical - Red Word Triage >> May 04, 2024  2:36 PM Thersia C wrote: [facility] that prompted transfer to Nurse Triage:Patient called in stated she took blood pressure, 181/89 , has a bad headache all day and pulse was 88 Answer Assessment - Initial Assessment Questions 1. BLOOD PRESSURE: What is the blood pressure? Did you take at least two measurements 5 minutes apart?     181/89 2. ONSET: When did you take your blood pressure?     today 3. HOW: How did you take your blood pressure? (e.g., automatic home BP monitor, visiting nurse)     Home cuff 4. HISTORY: Do you have a history of high blood pressure?     Yes 5. MEDICINES: Are you taking any medicines for blood pressure? Have you missed any doses recently?     no 6. OTHER SYMPTOMS: Do you have any symptoms? (e.g., blurred vision, chest pain, difficulty breathing, headache, weakness)     Headache 7. PREGNANCY: Is there any chance you are pregnant? When was your last menstrual period?     no  Protocols used: Blood Pressure - High-A-AH  Reason for Disposition . [1] Systolic BP  >= 180 OR Diastolic >= 110 AND [2] missed most recent dose of blood pressure medication  Answer Assessment - Initial Assessment Questions 1. BLOOD PRESSURE: What is the blood pressure? Did you take at least two measurements 5 minutes apart?     181/89 2. ONSET: When did you take your blood pressure?     today 3. HOW: How did you take your blood pressure? (e.g., automatic home BP monitor, visiting nurse)     Home cuff 4. HISTORY: Do you have a history of high blood pressure?     Yes 5. MEDICINES: Are you taking any medicines for blood pressure? Have you missed any doses recently?     no 6. OTHER SYMPTOMS: Do you have any symptoms? (e.g., blurred vision, chest pain, difficulty breathing, headache, weakness)     Headache 7. PREGNANCY: Is there any chance you are pregnant? When was your last  menstrual period?     no  Protocols used: Blood Pressure - High-A-AH  Answer Assessment - Initial Assessment Questions 1. BLOOD PRESSURE: What is the blood pressure? Did you take at least two measurements 5 minutes apart?     181/89 2. ONSET: When did you take your blood pressure?     today 3. HOW: How did you take your blood pressure? (e.g., automatic home BP monitor, visiting nurse)     Home cuff 4. HISTORY: Do you have a history of high blood pressure?     Yes 5. MEDICINES: Are you taking any medicines for blood pressure? Have you missed any doses recently?     no 6. OTHER SYMPTOMS: Do you have any symptoms? (e.g., blurred vision, chest pain, difficulty breathing, headache, weakness)     Headache 7. PREGNANCY: Is there any chance you are pregnant? When was your last menstrual period?     no  Protocols used: Blood Pressure - High-A-AH  Reason for Disposition . Systolic BP  >= 180 OR Diastolic >= 110  Answer Assessment - Initial Assessment Questions 1. BLOOD PRESSURE: What is the blood pressure? Did you take at least two measurements 5 minutes apart?     181/89 2. ONSET: When did you take your blood pressure?     today 3. HOW: How did you take your blood pressure? (e.g., automatic home BP monitor,  visiting nurse)     Home cuff 4. HISTORY: Do you have a history of high blood pressure?     Yes 5. MEDICINES: Are you taking any medicines for blood pressure? Have you missed any doses recently?     no 6. OTHER SYMPTOMS: Do you have any symptoms? (e.g., blurred vision, chest pain, difficulty breathing, headache, weakness)     Headache 7. PREGNANCY: Is there any chance you are pregnant? When was your last menstrual period?     no  Protocols used: Blood Pressure - High-A-AH

## 2024-05-04 NOTE — ED Provider Notes (Signed)
 [facility] Provider Note    Event Date/Time   First MD Initiated Contact with Patient 05/04/24 1755     (approximate)   History   Hypertension  Pt BIB [facility] from Urgent care. Pt went to UC due to HTN. Pt c/o headache x 2 days. Pt has hx stroke. Axo and ambulatory to stretcher.  206/89 66 HR 118 CBG   HPI Ashley Gross is a 73 y.o. female PMH T2DM, hypertension, ACA aneurysm status postembolization (2022), meningioma, iron deficiency anemia, chronic headaches, asthma, prior ischemic stroke (05/2022) presents for evaluation of headache - Present for about 3 days, gradual onset, 8/10 across her forehead.  No preceding trauma.  Not on blood thinners.  No vision change or focal weakness or paresthesias.  Has been refractory to Tylenol , last dose around 2:30 PM today. - Was seen in urgent care, thought to have possible mild left-sided facial droop so was sent to ED for eval. - Collateral offered by patient's family member, has not noticed any droop, has also not noticed any difficulty speaking   Per chart review, patient was seen in clinic today complaining of headache.  Thought to have possible mild left-sided facial droop and left hand weakness, sent to ED for further eval.     Physical Exam   Triage Vital Signs: ED Triage Vitals  Encounter Vitals Group     BP 05/04/24 1759 (!) 217/144     Girls Systolic BP Percentile --      Girls Diastolic BP Percentile --      Boys Systolic BP Percentile --      Boys Diastolic BP Percentile --      Pulse Rate 05/04/24 1759 64     Resp 05/04/24 1759 20     Temp 05/04/24 1802 98.1 F (36.7 C)     Temp Source 05/04/24 1802 Oral     SpO2 05/04/24 1759 100 %     Weight 05/04/24 1759 148 lb (67.1 kg)     Height 05/04/24 1759 5' 4 (1.626 m)     Head Circumference --      Peak Flow --      Pain Score 05/04/24 1759 8     Pain Loc --      Pain Education --      Exclude from Growth Chart --     Most recent  vital signs: Vitals:   05/04/24 1802 05/04/24 1830  BP:  (!) 169/67  Pulse:  (!) 55  Resp:  16  Temp: 98.1 F (36.7 C)   SpO2:  97%     General: Awake, no distress.  CV:  Good peripheral perfusion. RRR, RP 2+ Resp:  Normal effort. CTAB Abd:  No distention. Nontender to deep palpation throughout Other:  Aox4, CN II-XII intact, FNF wnl, finger taps fast b/l, 5/5 strength in bilateral finger extension/grip, arm flexion/extension, EHL/FHL. BUE AG 10+ sec no drift, BLE AG 5+ sec no drift. Ambulates with steady gait. SILT. Negative Rhomberg.  1a  Level of consciousness: 0=alert; keenly responsive  1b. LOC questions:  0=Performs both tasks correctly  1c. LOC commands: 0=Performs both tasks correctly  2.  Best Gaze: 0=normal  3.  Visual: 0=No visual loss  4. Facial Palsy: 0=Normal symmetric movement  5a.  Motor left arm: 0=No drift, limb holds 90 (or 45) degrees for full 10 seconds  5b.  Motor right arm: 0=No drift, limb holds 90 (or 45) degrees for full 10 seconds  6a. motor  left leg: 0=No drift, limb holds 90 (or 45) degrees for full 10 seconds  6b  Motor right leg:  0=No drift, limb holds 90 (or 45) degrees for full 10 seconds  7. Limb Ataxia: 0=Absent  8.  Sensory: 0=Normal; no sensory loss  9. Best Language:  0=No aphasia, normal  10. Dysarthria: 0=Normal  11. Extinction and Inattention: 0=No abnormality  12. Distal motor function: 0=Normal   Total:   0       ED Results / Procedures / Treatments   Labs (all labs ordered are listed, but only abnormal results are displayed) Labs Reviewed  BASIC METABOLIC PANEL WITH GFR - Abnormal; Notable for the following components:      Result Value   Glucose, Bld 115 (*)    Calcium  8.8 (*)    All other components within normal limits  CBC WITH DIFFERENTIAL/PLATELET  PROTIME-INR  TROPONIN I (HIGH SENSITIVITY)  TROPONIN I (HIGH SENSITIVITY)     EKG  Ecg = nsr, rate 64, no ste/std, no repol abnl, normal axis, mildly prolonged PR  at 209. Qtc prolonged at 562.  No evidence of ischemia no arrhythmia on my interpretation.   RADIOLOGY Radiology interpreted by myself and radiology report reviewed.  No intracranial hemorrhage identified.    PROCEDURES:  Critical Care performed: No  Procedures   MEDICATIONS ORDERED IN ED: Medications  metoCLOPramide  (REGLAN ) injection 10 mg (10 mg Intravenous Given 05/04/24 1847)  diphenhydrAMINE  (BENADRYL ) injection 25 mg (25 mg Intravenous Given 05/04/24 1846)  iohexol  (OMNIPAQUE ) 350 MG/ML injection 75 mL (75 mLs Intravenous Contrast Given 05/04/24 1854)     IMPRESSION / MDM / ASSESSMENT AND PLAN / ED COURSE  I reviewed the triage vital signs and the nursing notes.                              DDX/MDM/AP: Differential diagnosis includes, but is not limited to, migraine or tension headache, consider aneurysmal rupture though patient is very well-appearing here with nonfocal neurologic exam.  Do not clinically suspect ischemic CVA with nonfocal neurologic exam here and patient with no related subjective complaints.  Patient was hypertensive on arrival though is significantly improved to 160s over 60s by time of my eval without intervention --no indication for aggressive BP management at this time.  Plan: - Labs - EKG - Reglan , Benadryl  - CTA head - Reassess  Patient's presentation is most consistent with acute presentation with potential threat to life or bodily function.  The patient is on the cardiac monitor to evaluate for evidence of arrhythmia and/or significant heart rate changes.  ED course below.  Workup unremarkable except for some contrast visualized in the aneurysmal sac.  Discussed with neurosurgery who recommends outpatient discussion with IR for possible repeat angiogram.  All symptoms resolved with treatment of headache.  Remains with nonfocal neuroexam here.  Presentation overall most consistent with benign headache type such as migraine.  Recommend Tylenol  as  needed.  Plan for PMD follow-up.  ED return precautions in place.  Patient agrees with plan.  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 Contrast noted within the peripheral aspect of the residual aneurysm sac. Maximum dimensions of the aneurysm sac measuring 5 x 4 x 5 mm.  Patent intracranial arterial vasculature.  Mild atherosclerosis of the carotid siphons without high-grade stenosis.   [MM]  2008 Labs reviewed,  unremarkable [MM]  2211 D/w CTA findings w/ Dr. Claudene States no need for any urgent intervention now, will discuss with IR and potentially have patient follow-up with them for consideration of angiogram [MM]  2213 Patient reevaluated, feeling very well, headache is entirely resolved.  Discussed CTA findings and possible recommendation for angiogram--patient is unsure whether she would like to pursue this at her age, will continue discussion as outpatient.  Plan for PMD follow-up.  ED return precautions in place.  Patient agrees with plan. [MM]    Clinical Course User Index [MM] Clarine Ozell LABOR, MD     FINAL CLINICAL IMPRESSION(S) / ED DIAGNOSES   Final diagnoses:  Nonintractable headache, unspecified chronicity pattern, unspecified headache type     Rx / DC Orders   ED Discharge Orders     None        Note:  This document was prepared using Dragon voice recognition software and may include unintentional dictation errors.   Clarine Ozell LABOR, MD 05/04/24 2215

## 2024-05-04 NOTE — ED Notes (Signed)
 Patient is being discharged from the Urgent Care and sent to the Emergency Department via EMS . Per NP Corlis, patient is in need of higher level of care due to headaches, elevated BP and possible left side face droop. Patient is aware and verbalizes understanding of plan of care.  Vitals:   05/04/24 1651 05/04/24 1700  BP: (!) 175/92 (!) 164/80  Pulse: 65   Resp: 18   Temp: 97.7 F (36.5 C)   SpO2: 97%

## 2024-05-04 NOTE — ED Triage Notes (Signed)
 Pt BIB [facility] from Urgent care. Pt went to UC due to HTN. Pt c/o headache x 2 days. Pt has hx stroke. Axo and ambulatory to stretcher.  206/89 66 HR 118 CBG

## 2024-05-04 NOTE — Discharge Instructions (Signed)
 Your evaluation in the emergency department was reassuring.  As discussed, there was a slight abnormality on the CTA of your head, and this was discussed with the neurosurgeon.  They will discuss with the interventional radiology team, as they may recommend an angiogram to further evaluate this.  Please do follow-up with your primary care provider for reevaluation, and return to the emergency department with any new or worsening symptoms.

## 2024-05-04 NOTE — ED Provider Notes (Signed)
 Ashley Gross    CSN: [id number] Arrival date & time: 05/04/24  1538      History   Chief Complaint Chief Complaint  Patient presents with   Hypertension    HPI Ashley Gross is a 73 y.o. female.  Patient presents with 1 week history of headache.  She took her blood pressure today and it was 181/89 at home.  She takes amlodipine  and losartan  for hypertension.  She reports that has been elevated for the last 3 days due to job stress.  She denies chest pain, shortness of breath, numbness, dizziness.  Her medical history includes CVA which she reports initially affected her left side but she reports she has no residual deficits.  Her medical history includes aneurysm, CVA, diabetes, hypertension.   The history is provided by the patient and medical records.    Past Medical History:  Diagnosis Date   Abnormal liver function test 10/06/2012   Anemia 01/25/2022   Anemia, unspecified    Asthma    B12 deficiency 02/11/2019   Bleeding internal hemorrhoids 09/13/2017   Choledocholithiasis 06/29/2012   Formatting of this note might be different from the original. Dilated CBD on abdominal imaging 04/2012   Chronic diarrhea 02/16/2013   Colitis    Complication of anesthesia    asthma attack after shoulder surgery   Diabetes mellitus (HCC)    type II   Esophagitis    GERD (gastroesophageal reflux disease)    Heart murmur    for years- nothing to be concerned about.   History of kidney stones    Hx of arteriovenous malformation (AVM)    Hypertension    IBS (irritable bowel syndrome)    Loss of weight    Peripheral vascular disease (HCC)    Pernicious anemia    Personal history of colonic polyps 02/10/2013   02/08/13 colonoscopy - question of rectal varices, two 3-42mm polyps in the sigmoid colon, mass at the hepatic flexure, one 5mm polyp at the hepatic flexure, nodule at the ileocecal valve, congested mucusa in the entire colon, flattened villi mucosa in the terminal ileum.      Pneumonia    Psoriatic arthritis (HCC)    s/p penicillamine, plaquenil, MTX, sulfasalazine.  s/p Embrel, Humira,.  Iritis.     Pure hypercholesterolemia    Rectal bleeding 07/15/2017   Stroke (HCC) 05/29/2022   per patient    Patient Active Problem List   Diagnosis Date Noted   Diarrhea 02/06/2024   Hypokalemia 12/18/2023   Fever 12/03/2023   Sore throat 12/03/2023   Murmur 09/12/2023   Back pain 09/12/2023   Dizziness 07/19/2023   Allergic rhinitis 07/19/2023   Iron deficiency anemia 06/07/2023   Severe obstructive sleep apnea 05/10/2023   Insomnia 05/10/2023   Daytime somnolence 03/30/2023   Perianal excoriation 02/26/2023   Asthma exacerbation 02/20/2023   Sepsis (HCC) 02/18/2023   Cough 02/15/2023   Urinary frequency 12/28/2022   Aneurysm (HCC) 11/28/2022   Type 2 diabetes mellitus with diabetic nephropathy, without long-term current use of insulin  (HCC) 11/28/2022   History of CVA (cerebrovascular accident) 05/30/2022   Anemia 01/25/2022   Left inguinal hernia    Meningioma (HCC) 11/05/2021   Inguinal hernia 10/31/2021   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HCC) 10/31/2021   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   Ear fullness 05/25/2021   Skin lesion 03/15/2021   Rib pain on left side 08/05/2020   Sleep difficulties 06/08/2020   Swelling of lower extremity  11/26/2019   Healthcare maintenance 09/13/2019   Asthma 12/28/2018   Vitamin D  deficiency 11/01/2018   Tendinitis of upper biceps tendon of left shoulder 09/20/2018   Depression, major, single episode, mild (HCC) 09/15/2018   Mild intermittent asthma without complication 09/15/2018   Type 2 diabetes mellitus with hyperglycemia (HCC) 09/15/2018   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   Traumatic incomplete tear of left rotator cuff 09/09/2018   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   Low back pain 03/29/2016   Acute sinusitis 12/19/2015   Stress 12/12/2014    Inflammatory polyps of colon (HCC) 02/16/2013   History of colonic polyps 02/10/2013   Hypertension 10/06/2012   GERD (gastroesophageal reflux disease) 10/06/2012   Hypercholesterolemia 10/06/2012   Elevated alkaline phosphatase level 06/29/2012   Psoriatic arthritis (HCC) 06/29/2012    Past Surgical History:  Procedure Laterality Date   ABDOMINAL HYSTERECTOMY  1981   ovaries left in place   ANKLE SURGERY     right   BUNIONECTOMY     CHOLECYSTECTOMY  1989   COLONOSCOPY WITH PROPOFOL  N/A 12/27/2017   Procedure: COLONOSCOPY WITH PROPOFOL ;  Surgeon: Unk Corinn Skiff, MD;  Location: [facility] ENDOSCOPY;  Service: Gastroenterology;  Laterality: N/A;   COLONOSCOPY WITH PROPOFOL  N/A 01/04/2019   Procedure: COLONOSCOPY WITH PROPOFOL ;  Surgeon: Unk Corinn Skiff, MD;  Location: [facility];  Service: Endoscopy;  Laterality: N/A;  Diabetic - oral and injectable   COLONOSCOPY WITH PROPOFOL  N/A 07/24/2021   Procedure: COLONOSCOPY WITH PROPOFOL ;  Surgeon: Unk Corinn Skiff, MD;  Location: [facility];  Service: Endoscopy;  Laterality: N/A;  Diabetic   ESOPHAGOGASTRODUODENOSCOPY (EGD) WITH PROPOFOL  N/A 12/27/2017   Procedure: ESOPHAGOGASTRODUODENOSCOPY (EGD) WITH PROPOFOL ;  Surgeon: Unk Corinn Skiff, MD;  Location: [facility] ENDOSCOPY;  Service: Gastroenterology;  Laterality: N/A;   HAND SURGERY     right   HEEL SPUR SURGERY     IR 3D INDEPENDENT WKST  10/01/2021   IR ANGIO INTRA EXTRACRAN SEL COM CAROTID INNOMINATE UNI L MOD SED  10/01/2021   IR ANGIO INTRA EXTRACRAN SEL INTERNAL CAROTID UNI R MOD SED  10/01/2021   IR ANGIO INTRA EXTRACRAN SEL INTERNAL CAROTID UNI R MOD SED  05/29/2022   IR ANGIO VERTEBRAL SEL VERTEBRAL BILAT MOD SED  10/01/2021   IR ANGIO VERTEBRAL SEL VERTEBRAL UNI R MOD SED  05/29/2022   IR CT HEAD LTD  10/01/2021   IR TRANSCATH/EMBOLIZ  10/01/2021   IR US  GUIDE VASC ACCESS RIGHT  10/01/2021   IR US  GUIDE VASC ACCESS RIGHT  05/29/2022   NOSE SURGERY     x2    POLYPECTOMY  01/04/2019   Procedure: POLYPECTOMY;  Surgeon: Unk Corinn Skiff, MD;  Location: [facility];  Service: Endoscopy;;   POLYPECTOMY N/A 07/24/2021   Procedure: POLYPECTOMY;  Surgeon: Unk Corinn Skiff, MD;  Location: [facility];  Service: Endoscopy;  Laterality: N/A;   RADIOLOGY WITH ANESTHESIA N/A 10/01/2021   Procedure: HENDERSON WITH ANESTHESIA;  Surgeon: de Macedo Rodrigues, Katyucia, MD;  Location: [facility] OR;  Service: Radiology;  Laterality: N/A;   SHOULDER ARTHROSCOPY WITH OPEN ROTATOR CUFF REPAIR Left 09/20/2018   Procedure: SHOULDER ARTHROSCOPY WITH OPEN ROTATOR CUFF REPAIR;  Surgeon: Edie Norleen PARAS, MD;  Location: [facility] ORS;  Service: Orthopedics;  Laterality: Left;   SHOULDER CLOSED REDUCTION Left 12/21/2018   Procedure: MANIPULATION UNDER ANESTHESIA WITH STEROID INJECTION;  Surgeon: Edie Norleen PARAS, MD;  Location: [facility];  Service: Orthopedics;  Laterality: Left;  Diabetic - insulin  and oral  meds   UMBILICAL HERNIA REPAIR     XI ROBOTIC ASSISTED INGUINAL HERNIA REPAIR WITH MESH Left 12/16/2021   Procedure: XI ROBOTIC ASSISTED INGUINAL HERNIA REPAIR WITH MESH;  Surgeon: Desiderio Schanz, MD;  Location: [facility] ORS;  Service: General;  Laterality: Left;    OB History   No obstetric history on file.      Home Medications    Prior to Admission medications   Medication Sig Start Date End Date Taking? Authorizing Provider  acetaminophen  (TYLENOL ) 500 MG tablet Take 2 tablets (1,000 mg total) by mouth every 6 (six) hours as needed for mild pain. 12/16/21   Desiderio Schanz, MD  albuterol  (VENTOLIN  HFA) 108 (90 Base) MCG/ACT inhaler Inhale 2 puffs into the lungs every 6 (six) hours as needed for wheezing or shortness of breath.    [provider]  amLODipine  (NORVASC ) 5 MG tablet TAKE 1 TABLET(5 MG) BY MOUTH TWICE DAILY 04/07/24   Glendia Shad, MD  Azelastine  HCl 137 MCG/SPRAY SOLN PLACE 2 SPRAYS INTO BOTH NOSTRILS 2 (TWO) TIMES DAILY. USE IN EACH  NOSTRIL AS DIRECTED 11/22/23   Maribeth Camellia MATSU, MD  cetirizine (ZYRTEC) 10 MG tablet Take 10 mg by mouth daily.    [provider]  Cholecalciferol  (VITAMIN D ) 50 MCG (2000 UT) tablet Take 2,000 Units by mouth daily.    [provider]  CREON  36000-114000 units CPEP capsule Take 1-2 capsules (36,000-72,000 Units total) by mouth See admin instructions. Take 720000 units with each meal and 36000 units with snacks 07/15/22   Vanga, Rohini Reddy, MD  Eszopiclone  3 MG TABS Take 1 tablet (3 mg total) by mouth at bedtime. Take immediately before bedtime 12/20/23   Cobb, Katherine V, NP  insulin  degludec (TRESIBA  FLEXTOUCH) 100 UNIT/ML FlexTouch Pen Inject 16 Units into the skin daily. 04/05/23   Glendia Shad, MD  loperamide  (IMODIUM ) 2 MG capsule Take 4 mg by mouth daily.    [provider]  losartan  (COZAAR ) 100 MG tablet TAKE 1 TABLET(100 MG) BY MOUTH AT BEDTIME 01/03/24   Glendia Shad, MD  mometasone  (ELOCON ) 0.1 % ointment Apply 1 Application topically 2 (two) times daily. 03/16/23   [provider]  omeprazole  (PRILOSEC) 20 MG capsule TAKE 1 CAPSULE(20 MG) BY MOUTH TWICE DAILY 03/15/23   Glendia Shad, MD  ONE TOUCH ULTRA TEST test strip PATIENT NEEDS NEW METER STRIPS AND LANCETS FOR ONE TOUCH. HER INSURANCE NO LONGER COVERS ACCUCHEK. 07/31/15   Glendia Shad, MD  potassium chloride  (KLOR-CON  M) 10 MEQ tablet TAKE 1 TABLET BY MOUTH EVERY DAY 03/14/24   Glendia Shad, MD  rosuvastatin  (CRESTOR ) 10 MG tablet Take 1 tablet (10 mg total) by mouth daily. 04/11/24   Glendia Shad, MD  vedolizumab  (ENTYVIO ) 300 MG injection Inject 300 mg into the vein 4 (four) times a week. 12/23/23   Unk Corinn Skiff, MD  NAPOLEON INHUB 100-50 MCG/ACT AEPB INHALE 1 PUFF INTO THE LUNGS TWICE A DAY 08/25/23   Glendia Shad, MD    Family History Family History  Problem Relation Age of Onset   Diabetes Other    Hypertension Other    Breast cancer Neg Hx    Colon cancer Neg Hx      Social History Social History   Tobacco Use   Smoking status: Never    Passive exposure: Never   Smokeless tobacco: Never  Vaping Use   Vaping status: Never Used  Substance Use Topics   Alcohol use: Never   Drug use: Never  Allergies   Aspirin , Beta adrenergic blockers, Lisinopril , Mtx support [cobalamine combinations], Nsaids, Vasotec [enalapril], Verapamil , Voltaren [diclofenac sodium], Erythromycin , Indocin [indomethacin], and Jardiance  [empagliflozin ]   Review of Systems Review of Systems  Constitutional:  Negative for chills and fever.  Respiratory:  Negative for cough and shortness of breath.   Cardiovascular:  Negative for chest pain and palpitations.  Neurological:  Positive for headaches. Negative for dizziness, syncope, facial asymmetry, speech difficulty, weakness, light-headedness and numbness.     Physical Exam Triage Vital Signs ED Triage Vitals  Encounter Vitals Group     BP 05/04/24 1651 (!) 175/92     Girls Systolic BP Percentile --      Girls Diastolic BP Percentile --      Boys Systolic BP Percentile --      Boys Diastolic BP Percentile --      Pulse Rate 05/04/24 1651 65     Resp 05/04/24 1651 18     Temp 05/04/24 1651 97.7 F (36.5 C)     Temp src --      SpO2 05/04/24 1651 97 %     Weight 05/04/24 1657 148 lb (67.1 kg)     Height 05/04/24 1657 5' 4 (1.626 m)     Head Circumference --      Peak Flow --      Pain Score 05/04/24 1656 8     Pain Loc --      Pain Education --      Exclude from Growth Chart --    No data found.  Updated Vital Signs BP (!) 164/80 (BP Location: Left Arm)   Pulse 65   Temp 97.7 F (36.5 C)   Resp 18   Ht 5' 4 (1.626 m)   Wt 148 lb (67.1 kg)   SpO2 97%   BMI 25.40 kg/m   Visual Acuity Right Eye Distance:   Left Eye Distance:   Bilateral Distance:    Right Eye Near:   Left Eye Near:    Bilateral Near:     Physical Exam Constitutional:      General: She is not in acute distress. HENT:      Mouth/Throat:     Mouth: Mucous membranes are moist.  Cardiovascular:     Rate and Rhythm: Normal rate and regular rhythm.     Heart sounds: Normal heart sounds.  Pulmonary:     Effort: Pulmonary effort is normal. No respiratory distress.     Breath sounds: Normal breath sounds.  Skin:    General: Skin is warm and dry.  Neurological:     Mental Status: She is alert and oriented to person, place, and time.     Sensory: No sensory deficit.     Motor: Weakness present.     Gait: Gait normal.     Comments: Minimal left side facial weakness.  Left hand grip slightly weak compared to right.  Psychiatric:        Mood and Affect: Mood normal.        Behavior: Behavior normal.      UC Treatments / Results  Labs (all labs ordered are listed, but only abnormal results are displayed) Labs Reviewed - No data to display  EKG   Radiology No results found.  Procedures Procedures (including critical care time)  Medications Ordered in UC Medications - No data to display  Initial Impression / Assessment and Plan / UC Course  I have reviewed the triage vital signs and the nursing notes.  Pertinent labs & imaging results that were available during my care of the patient were reviewed by me and considered in my medical decision making (see chart for details).    Headache, elevated blood pressure reading with hypertension, history of stroke, left-sided weakness.  Patient had an aneurysm in 2022 and stroke in 2023.  She reports temporary left side weakness initially but reports she recovered complete function of her left side.  In looking at her most recent wellness exam on 04/27/2024, no left-sided weakness noted.  Patient has very slight left facial droop and decreased strength of left hand grip today.  Her blood pressure today is 175/92 and repeated at 164/80.  She denies chest pain, dizziness, or shortness of breath.  EKG shows sinus rhythm, rate 65, no ST elevation, compared to  previous from 02/22/2023.  She has had an acute severe headache this week which she attributes to severe stress at work.  Given her history and current exam, sending to the ED via EMS.   Final Clinical Impressions(s) / UC Diagnoses   Final diagnoses:  Acute intractable headache, unspecified headache type  Elevated blood pressure reading in office with diagnosis of hypertension  History of stroke  Left-sided weakness   Discharge Instructions   None    ED Prescriptions   None    PDMP not reviewed this encounter.   Corlis Burnard DEL, NP 05/04/24 1739

## 2024-05-10 ENCOUNTER — Telehealth: Payer: Self-pay | Admitting: Internal Medicine

## 2024-05-10 NOTE — Telephone Encounter (Signed)
 Pt stopped by and dropped off [employer] pt assistance paperwork to be completed by Dr Glendia. Had some questions about the process as she says she's never had the application mailed to her before (?) If anything further is needed from pt, please reach out to her via phone call. [facility]

## 2024-05-11 NOTE — Telephone Encounter (Signed)
 Reviewed. Agree with need for monitoring blood pressure and needs to be seen if persistent elevation.

## 2024-05-11 NOTE — Telephone Encounter (Signed)
 FYI for you  Paper work completed for signature. Called patient to let her know we received prescriber portion via e-fax. While on phone, patient pt voiced that she was recently sent to the ED for elevated BP. Pt says she was taken out of work by ED MD due to a tremendous amount of stress at work. MD recommended that she stay home until BP is better controlled. Patient stated that they did not give her a letter but her job has never asked for one before. She is going in to work a couple days early AM to avoid coworkers and get some work done. She is going to let me know if she needs anything further regarding this. She is going to keep a check on her BP (advised to monitor daily.) She has not rechecked this week but she is not having headaches like she was and stress level is down. She was advised to return to ED if stays significantly elevated. Advised patient she could monitor pressures and we could set up earlier f/u with PCP.

## 2024-05-12 ENCOUNTER — Ambulatory Visit: Payer: Self-pay

## 2024-05-12 DIAGNOSIS — D329 Benign neoplasm of meninges, unspecified: Secondary | ICD-10-CM

## 2024-05-12 DIAGNOSIS — I671 Cerebral aneurysm, nonruptured: Secondary | ICD-10-CM

## 2024-05-12 DIAGNOSIS — E237 Disorder of pituitary gland, unspecified: Secondary | ICD-10-CM

## 2024-05-12 NOTE — Telephone Encounter (Signed)
 Patient is going to monitor pressures and call with update

## 2024-05-15 DIAGNOSIS — G4733 Obstructive sleep apnea (adult) (pediatric): Secondary | ICD-10-CM | POA: Diagnosis not present

## 2024-05-16 NOTE — Telephone Encounter (Signed)
 Faxed Provider portion again to office for review. Current enrollment ends 05/29/2024

## 2024-05-17 NOTE — Telephone Encounter (Signed)
 PAP: Application for Ashley Gross has been submitted to [employer], via fax

## 2024-05-19 ENCOUNTER — Inpatient Hospital Stay: Admission: RE | Admit: 2024-05-19 | Source: Ambulatory Visit

## 2024-05-23 NOTE — Telephone Encounter (Signed)
 PAP: Patient assistance application for Tresiba  has been approved by PAP Companies: [employer] from 05/26/2024 to 05/21/2025. Medication should be delivered to PAP Delivery: Provider's office. For further shipping updates, please contact [employer] at [phone]. Patient ID is: [id number]

## 2024-06-08 ENCOUNTER — Ambulatory Visit: Admitting: Neurosurgery

## 2024-06-13 ENCOUNTER — Other Ambulatory Visit: Payer: Self-pay | Admitting: Nurse Practitioner

## 2024-06-13 ENCOUNTER — Ambulatory Visit: Admitting: Neurosurgery

## 2024-06-13 DIAGNOSIS — F5101 Primary insomnia: Secondary | ICD-10-CM

## 2024-06-13 NOTE — Telephone Encounter (Signed)
 Copied from CRM [id number]. Topic: Clinical - Medication Refill >> Jun 13, 2024  1:03 PM Rozanna G wrote: Medication: Eszopiclone  3 MG TABS   Has the patient contacted their pharmacy? Yes (Agent: If no, request that the patient contact the pharmacy for the refill. If patient does not wish to contact the pharmacy document the reason why and proceed with request.) (Agent: If yes, when and what did the pharmacy advise?)  This is the patient's preferred pharmacy:  [id number] [place], [place] - [address] [place] [address] Phone: [phone] Fax: [phone]   Is this the correct pharmacy for this prescription? Yes If no, delete pharmacy and type the correct one.   Has the prescription been filled recently? Yes  Is the patient out of the medication? No  Has the patient been seen for an appointment in the last year OR does the patient have an upcoming appointment? Yes  Can we respond through MyChart? Yes  Agent: Please be advised that Rx refills may take up to 3 business days. We ask that you follow-up with your pharmacy.

## 2024-06-14 ENCOUNTER — Encounter: Payer: Self-pay | Admitting: Neurosurgery

## 2024-06-14 ENCOUNTER — Ambulatory Visit (INDEPENDENT_AMBULATORY_CARE_PROVIDER_SITE_OTHER): Admitting: Neurosurgery

## 2024-06-14 ENCOUNTER — Telehealth: Payer: Self-pay | Admitting: Neurosurgery

## 2024-06-14 VITALS — BP 170/89 | HR 70 | Ht 64.0 in | Wt 150.0 lb

## 2024-06-14 DIAGNOSIS — D352 Benign neoplasm of pituitary gland: Secondary | ICD-10-CM

## 2024-06-14 DIAGNOSIS — D329 Benign neoplasm of meninges, unspecified: Secondary | ICD-10-CM | POA: Diagnosis not present

## 2024-06-14 DIAGNOSIS — E237 Disorder of pituitary gland, unspecified: Secondary | ICD-10-CM

## 2024-06-14 DIAGNOSIS — I671 Cerebral aneurysm, nonruptured: Secondary | ICD-10-CM | POA: Diagnosis not present

## 2024-06-14 NOTE — Telephone Encounter (Signed)
 Dr Rosslyn wants patient to come back in 2 years for follow up with MRI prior. I placed patient on recall list for 2 years for now so we can schedule follow up. This message is only a reminder about MRI needed before.

## 2024-06-14 NOTE — Progress Notes (Signed)
 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 Evern here at [facility] in 2022.  In 2023, she had a follow-up angiogram and unfortunately had some neurological deficits after that from which she has completely recovered.  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  She returns for follow-up.  The CT angiogram was recently done because the MR angiogram suggested recurrence however follow-up imaging confirmed that this was an artifact from the web device.  The aneurysm is completely excluded.  The meningioma and the pituitary adenoma are also unchanged.  I shared the good news with her and she is very happy to hear this.  We decided to see each other back in 2 years with a follow-up MR angiogram and an MRI of the brain without contrast [size assessment cannot be done without contrast administration] of the brain with pituitary protocol.  She is having headaches which she suspects are from her blood pressure which is sustained over 160.  She is going to call her primary care doctor to get medication adjustment.  She voiced understanding that if she starts having any visual problems to let me know.

## 2024-06-15 DIAGNOSIS — G4733 Obstructive sleep apnea (adult) (pediatric): Secondary | ICD-10-CM | POA: Diagnosis not present

## 2024-06-15 MED ORDER — ESZOPICLONE 3 MG PO TABS
3.0000 mg | ORAL_TABLET | Freq: Every day | ORAL | 0 refills | Status: DC
Start: 2024-06-15 — End: 2024-07-17

## 2024-06-15 NOTE — Telephone Encounter (Signed)
 Controlled substance. Has to be seen every 6 months. Last OV was in February. I will send one refill but need to move up her OV to be seen in next 4 weeks in [place]. Thanks.

## 2024-06-21 DIAGNOSIS — G4733 Obstructive sleep apnea (adult) (pediatric): Secondary | ICD-10-CM | POA: Diagnosis not present

## 2024-06-26 ENCOUNTER — Telehealth: Payer: Self-pay

## 2024-06-26 NOTE — Telephone Encounter (Signed)
 Copied from CRM #[id number]. Topic: Clinical - Red Word Triage >> Jun 26, 2024 10:37 AM Henretta I wrote: Red Word that prompted transfer to Nurse Triage: Patient has been having some high blood pressure. Patient spoke with nuero last week who stated that she needed to be seen. Offered NT but pt doesn't want to speak to NT as she states they are just going to tell her to go to the ER and she wants to actually be seen .Patient would like a call to get scheduled with Dr. Glendia as soon as possible to be checked for bp concerns.

## 2024-06-26 NOTE — Telephone Encounter (Signed)
 Patient stated that her blood pressure has been running high. She went to ED in July and was told to let us  know if persistent elevation. Patient has been scheduled for acute visit for medication adjustment. Confirmed no acute issues other than she has a mild headache that she has had since July. Will go to ED or UC if any worsening symptoms.

## 2024-06-27 ENCOUNTER — Encounter: Payer: Self-pay | Admitting: Family

## 2024-06-27 ENCOUNTER — Ambulatory Visit (INDEPENDENT_AMBULATORY_CARE_PROVIDER_SITE_OTHER): Admitting: Family

## 2024-06-27 VITALS — BP 158/68 | HR 73 | Temp 97.6°F | Resp 20 | Ht 64.0 in | Wt 149.2 lb

## 2024-06-27 DIAGNOSIS — I10 Essential (primary) hypertension: Secondary | ICD-10-CM

## 2024-06-27 DIAGNOSIS — J019 Acute sinusitis, unspecified: Secondary | ICD-10-CM

## 2024-06-27 DIAGNOSIS — B9689 Other specified bacterial agents as the cause of diseases classified elsewhere: Secondary | ICD-10-CM

## 2024-06-27 MED ORDER — AMLODIPINE BESYLATE 10 MG PO TABS: 10.0000 mg | ORAL_TABLET | Freq: Every day | ORAL | 3 refills | Status: DC

## 2024-06-27 MED ORDER — AMOXICILLIN 500 MG PO CAPS
500.0000 mg | ORAL_CAPSULE | Freq: Three times a day (TID) | ORAL | 0 refills | Status: AC
Start: 2024-06-27 — End: 2024-07-04

## 2024-06-27 NOTE — Progress Notes (Signed)
 Acute Office Visit  Subjective:     Patient ID: Ashley Gross, female    DOB: 1951-09-28, 73 y.o.   MRN: [id number]  Chief Complaint  Patient presents with   Hypertension    High since July    HPI Patient is in today with concerns of persistent, elevated blood pressure. She was seen in the ED July 3rd with a blood pressure of 217/144. She was stabilized and is currently taking Norvasc , Cozaar  100mg . She is tolerating it well. She saw Neurology last week and her blood pressure was in the 160s. They suggested she be seen by her PCP.   Patient also reports sinus pressure, congestion, watery eyes x 1 week. She has been taking Claritin and a steroid nasal spray that appeared to be helping initially but no longer working well. She denies fever.   Review of Systems  Constitutional:  Negative for chills and fever.  HENT:  Positive for congestion and sinus pain.   Respiratory:  Positive for cough. Negative for shortness of breath.   Cardiovascular:  Negative for chest pain, palpitations and leg swelling.       Elevated blood pressure  Gastrointestinal: Negative.   Genitourinary: Negative.   Musculoskeletal: Negative.   Neurological: Negative.   Endo/Heme/Allergies: Negative.   Psychiatric/Behavioral: Negative.    All other systems reviewed and are negative.  Past Medical History:  Diagnosis Date   Abnormal liver function test 10/06/2012   Allergy 1986   Anemia 01/25/2022   Anemia, unspecified    Asthma 1989   B12 deficiency 02/11/2019   Bleeding internal hemorrhoids 09/13/2017   Cataract 2009   Choledocholithiasis 06/29/2012   Formatting of this note might be different from the original. Dilated CBD on abdominal imaging 04/2012   Chronic diarrhea 02/16/2013   Clotting disorder (HCC) 2019   Colitis    Complication of anesthesia    asthma attack after shoulder surgery   COPD (chronic obstructive pulmonary disease) (HCC) 2016   Diabetes mellitus (HCC)    type II    Esophagitis    GERD (gastroesophageal reflux disease)    Heart murmur 1952   for years- nothing to be concerned about.   History of kidney stones    Hx of arteriovenous malformation (AVM)    Hypertension    IBS (irritable bowel syndrome)    Loss of weight    Neuromuscular disorder (HCC) 1988   Peripheral vascular disease (HCC)    Pernicious anemia    Personal history of colonic polyps 02/10/2013   02/08/13 colonoscopy - question of rectal varices, two 3-28mm polyps in the sigmoid colon, mass at the hepatic flexure, one 5mm polyp at the hepatic flexure, nodule at the ileocecal valve, congested mucusa in the entire colon, flattened villi mucosa in the terminal ileum.     Pneumonia    Psoriatic arthritis (HCC)    s/p penicillamine, plaquenil, MTX, sulfasalazine.  s/p Embrel, Humira,.  Iritis.     Pure hypercholesterolemia    Rectal bleeding 07/15/2017   Sleep apnea 04/2023   Stroke [facility]) 2023   per patient    Social History   Socioeconomic History   Marital status: Widowed    Spouse name: Not on file   Number of children: 2   Years of education: Not on file   Highest education level: Associate degree: occupational, Scientist, product/process development, or vocational program  Occupational History   Not on file  Tobacco Use   Smoking status: Never    Passive exposure: Never  Smokeless tobacco: Never  Vaping Use   Vaping status: Never Used  Substance and Sexual Activity   Alcohol use: Never   Drug use: Never   Sexual activity: Not Currently  Other Topics Concern   Not on file  Social History Narrative   She is married and has two children   Grand daughter lives with her.   Social Drivers of Health   Financial Resource Strain: Medium Risk (04/24/2024)   Overall Financial Resource Strain (CARDIA)    Difficulty of Paying Living Expenses: Somewhat hard  Food Insecurity: Food Insecurity Present (04/24/2024)   Hunger Vital Sign    Worried About Running Out of Food in the Last Year: Sometimes true     Ran Out of Food in the Last Year: Never true  Transportation Needs: No Transportation Needs (04/24/2024)   PRAPARE - Administrator, Civil Service (Medical): No    Lack of Transportation (Non-Medical): No  Physical Activity: Insufficiently Active (04/24/2024)   Exercise Vital Sign    Days of Exercise per Week: 2 days    Minutes of Exercise per Session: 20 min  Stress: Stress Concern Present (04/24/2024)   [employer] of Occupational Health - Occupational Stress Questionnaire    Feeling of Stress: To some extent  Social Connections: Moderately Integrated (04/24/2024)   Social Connection and Isolation Panel    Frequency of Communication with Friends and Family: More than three times a week    Frequency of Social Gatherings with Friends and Family: More than three times a week    Attends Religious Services: More than 4 times per year    Active Member of [employer] or Organizations: Yes    Attends Banker Meetings: More than 4 times per year    Marital Status: Widowed  Intimate Partner Violence: Not At Risk (04/28/2024)   Humiliation, Afraid, Rape, and Kick questionnaire    Fear of Current or Ex-Partner: No    Emotionally Abused: No    Physically Abused: No    Sexually Abused: No    Past Surgical History:  Procedure Laterality Date   ABDOMINAL HYSTERECTOMY  11/03/1979   ovaries left in place   ANKLE SURGERY     right   BRAIN SURGERY  November 2023   BUNIONECTOMY     CHOLECYSTECTOMY  11/03/1987   COLONOSCOPY WITH PROPOFOL  N/A 12/27/2017   Procedure: COLONOSCOPY WITH PROPOFOL ;  Surgeon: Unk Corinn Skiff, MD;  Location: [facility] END[facility];  Service: Gastroenterology;  Laterality: N/A;   COLONOSCOPY WITH PROPOFOL  N/A 01/04/2019   Procedure: COLONOSCOPY WITH PROPOFOL ;  Surgeon: Unk Corinn Skiff, MD;  Location: [facility];  Service: Endoscopy;  Laterality: N/A;  Diabetic - oral and injectable   COLONOSCOPY WITH PROPOFOL  N/A 07/24/2021   Procedure:  COLONOSCOPY WITH PROPOFOL ;  Surgeon: Unk Corinn Skiff, MD;  Location: [facility];  Service: Endoscopy;  Laterality: N/A;  Diabetic   ESOPHAGOGASTRODUODENOSCOPY (EGD) WITH PROPOFOL  N/A 12/27/2017   Procedure: ESOPHAGOGASTRODUODENOSCOPY (EGD) WITH PROPOFOL ;  Surgeon: Unk Corinn Skiff, MD;  Location: [facility] ENDOSCOPY;  Service: Gastroenterology;  Laterality: N/A;   EYE SURGERY  2011 2019   HAND SURGERY     right   HEEL SPUR SURGERY     HERNIA REPAIR  1977& 2023   IR 3D INDEPENDENT WKST  10/01/2021   IR ANGIO INTRA EXTRACRAN SEL COM CAROTID INNOMINATE UNI L MOD SED  10/01/2021   IR ANGIO INTRA EXTRACRAN SEL INTERNAL CAROTID UNI R MOD SED  10/01/2021   IR ANGIO  INTRA EXTRACRAN SEL INTERNAL CAROTID UNI R MOD SED  05/29/2022   IR ANGIO VERTEBRAL SEL VERTEBRAL BILAT MOD SED  10/01/2021   IR ANGIO VERTEBRAL SEL VERTEBRAL UNI R MOD SED  05/29/2022   IR CT HEAD LTD  10/01/2021   IR TRANSCATH/EMBOLIZ  10/01/2021   IR US  GUIDE VASC ACCESS RIGHT  10/01/2021   IR US  GUIDE VASC ACCESS RIGHT  05/29/2022   NOSE SURGERY     x2   POLYPECTOMY  01/04/2019   Procedure: POLYPECTOMY;  Surgeon: Unk Corinn Skiff, MD;  Location: [facility];  Service: Endoscopy;;   POLYPECTOMY N/A 07/24/2021   Procedure: POLYPECTOMY;  Surgeon: Unk Corinn Skiff, MD;  Location: [facility];  Service: Endoscopy;  Laterality: N/A;   RADIOLOGY WITH ANESTHESIA N/A 10/01/2021   Procedure: HENDERSON WITH ANESTHESIA;  Surgeon: de Macedo Rodrigues, Katyucia, MD;  Location: [facility];  Service: Radiology;  Laterality: N/A;   SHOULDER ARTHROSCOPY WITH OPEN ROTATOR CUFF REPAIR Left 09/20/2018   Procedure: SHOULDER ARTHROSCOPY WITH OPEN ROTATOR CUFF REPAIR;  Surgeon: Edie Norleen PARAS, MD;  Location: [facility] ORS;  Service: Orthopedics;  Laterality: Left;   SHOULDER CLOSED REDUCTION Left 12/21/2018   Procedure: MANIPULATION UNDER ANESTHESIA WITH STEROID INJECTION;  Surgeon: Edie Norleen PARAS, MD;  Location: [facility];  Service: Orthopedics;  Laterality: Left;  Diabetic - insulin  and oral meds   UMBILICAL HERNIA REPAIR     XI ROBOTIC ASSISTED INGUINAL HERNIA REPAIR WITH MESH Left 12/16/2021   Procedure: XI ROBOTIC ASSISTED INGUINAL HERNIA REPAIR WITH MESH;  Surgeon: Desiderio Schanz, MD;  Location: [facility] ORS;  Service: General;  Laterality: Left;    Family History  Problem Relation Age of Onset   Diabetes Mother    Hypertension Mother    Arthritis Father    Diabetes Father    Heart disease Father    Hypertension Father    Diabetes Other    Hypertension Other    ADD / ADHD Daughter    Asthma Daughter    ADD / ADHD Daughter    Asthma Daughter    Breast cancer Neg Hx    Colon cancer Neg Hx     Allergies  Allergen Reactions   Aspirin  Other (See Comments)    Increased bleeding   Beta Adrenergic Blockers     3rd degree blockage    Cobalamin Combinations Other (See Comments)    Respiratory symptoms  Respiratory symptoms   Lisinopril  Cough   Mtx Support [Cobalamine Combinations] Other (See Comments)    Respiratory symptoms   Nsaids    Vasotec [Enalapril] Cough   Verapamil  Other (See Comments)    Heart block   Voltaren [Diclofenac Sodium]     Pt unsure of reaction    Erythromycin  Other (See Comments)    Gi intolerance   Indocin [Indomethacin]     Upset stomach   Jardiance  [Empagliflozin ] Other (See Comments)    Recurrent yeast infections, even with washout and rechallenge    Current Outpatient Medications on File Prior to Visit  Medication Sig Dispense Refill   acetaminophen  (TYLENOL ) 500 MG tablet Take 2 tablets (1,000 mg total) by mouth every 6 (six) hours as needed for mild pain.     albuterol  (VENTOLIN  HFA) 108 (90 Base) MCG/ACT inhaler Inhale 2 puffs into the lungs every 6 (six) hours as needed for wheezing or shortness of breath.     Azelastine  HCl 137 MCG/SPRAY SOLN PLACE 2 SPRAYS INTO BOTH NOSTRILS 2 (TWO) TIMES DAILY. USE IN [facility]  NOSTRIL AS DIRECTED 90 mL 1   cetirizine  (ZYRTEC) 10 MG tablet Take 10 mg by mouth daily.     Cholecalciferol  (VITAMIN D ) 50 MCG (2000 UT) tablet Take 2,000 Units by mouth daily.     CREON  36000-114000 units CPEP capsule Take 1-2 capsules (36,000-72,000 Units total) by mouth See admin instructions. Take 720000 units with each meal and 36000 units with snacks 240 capsule 3   Eszopiclone  3 MG TABS Take 1 tablet (3 mg total) by mouth at bedtime. Take immediately before bedtime 30 tablet 0   insulin  degludec (TRESIBA  FLEXTOUCH) 100 UNIT/ML FlexTouch Pen Inject 16 Units into the skin daily. 15 mL 1   loperamide  (IMODIUM ) 2 MG capsule Take 4 mg by mouth daily.     losartan  (COZAAR ) 100 MG tablet TAKE 1 TABLET(100 MG) BY MOUTH AT BEDTIME 90 tablet 3   mometasone  (ELOCON ) 0.1 % ointment Apply 1 Application topically 2 (two) times daily.     omeprazole  (PRILOSEC) 20 MG capsule TAKE 1 CAPSULE(20 MG) BY MOUTH TWICE DAILY 180 capsule 3   ONE TOUCH ULTRA TEST test strip PATIENT NEEDS NEW METER STRIPS AND LANCETS FOR ONE TOUCH. HER INSURANCE NO LONGER COVERS ACCUCHEK. 100 each PRN   potassium chloride  (KLOR-CON  M) 10 MEQ tablet TAKE 1 TABLET BY MOUTH EVERY DAY 30 tablet 0   rosuvastatin  (CRESTOR ) 10 MG tablet Take 1 tablet (10 mg total) by mouth daily. 90 tablet 3   vedolizumab  (ENTYVIO ) 300 MG injection Inject 300 mg into the vein 4 (four) times a week.     WIXELA INHUB 100-50 MCG/ACT AEPB INHALE 1 PUFF INTO THE LUNGS TWICE A DAY 60 each 3   No current facility-administered medications on file prior to visit.    BP (!) 158/68   Pulse 73   Temp 97.6 F (36.4 C)   Resp 20   Ht 5' 4 (1.626 m)   Wt 149 lb 4 oz (67.7 kg)   SpO2 97%   BMI 25.62 kg/m chart     Objective:    BP (!) 158/68   Pulse 73   Temp 97.6 F (36.4 C)   Resp 20   Ht 5' 4 (1.626 m)   Wt 149 lb 4 oz (67.7 kg)   SpO2 97%   BMI 25.62 kg/m    Physical Exam Vitals and nursing note reviewed.  Constitutional:      Appearance: Normal appearance. She is normal  weight.  HENT:     Right Ear: Tympanic membrane, ear canal and external ear normal.     Left Ear: Tympanic membrane, ear canal and external ear normal.     Nose: Congestion and rhinorrhea present.     Comments: Maxillary sinus tenderness    Mouth/Throat:     Pharynx: No oropharyngeal exudate.  Cardiovascular:     Rate and Rhythm: Normal rate and regular rhythm.     Pulses: Normal pulses.     Heart sounds: Normal heart sounds.  Pulmonary:     Effort: Pulmonary effort is normal.     Breath sounds: Normal breath sounds.  Musculoskeletal:        General: Normal range of motion.  Skin:    General: Skin is warm and dry.  Neurological:     General: No focal deficit present.     Mental Status: She is alert and oriented to person, place, and time. Mental status is at baseline.  Psychiatric:        Mood and Affect: Mood  normal.        Behavior: Behavior normal.     No results found for any visits on 06/27/24.      Assessment & Plan:   Problem List Items Addressed This Visit   None Visit Diagnoses       Hypertension, uncontrolled    -  Primary   Relevant Medications   amLODipine  (NORVASC ) 10 MG tablet     Acute bacterial sinusitis       Relevant Medications   amoxicillin  (AMOXIL ) 500 MG capsule       Meds ordered this encounter  Medications   amoxicillin  (AMOXIL ) 500 MG capsule    Sig: Take 1 capsule (500 mg total) by mouth 3 (three) times daily for 7 days.    Dispense:  21 capsule    Refill:  0   amLODipine  (NORVASC ) 10 MG tablet    Sig: Take 1 tablet (10 mg total) by mouth daily.    Dispense:  30 tablet    Refill:  3   Increased Amlodipine  to 10 mg daily. Patient will send her blood pressure readings to us  by mychart or call in 2 weeks. We will also watch for swelling with the increase in Norvasc  dose. Will also treat with amoxil  for acute sinusitis. Call the office if symptoms worsen or persist. Recheck as scheduled and sooner as needed.  No follow-ups on  file.  Temika Sutphin B Danniela Mcbrearty, FNP

## 2024-07-10 DIAGNOSIS — B351 Tinea unguium: Secondary | ICD-10-CM | POA: Diagnosis not present

## 2024-07-10 DIAGNOSIS — M778 Other enthesopathies, not elsewhere classified: Secondary | ICD-10-CM | POA: Diagnosis not present

## 2024-07-10 DIAGNOSIS — L600 Ingrowing nail: Secondary | ICD-10-CM | POA: Diagnosis not present

## 2024-07-10 DIAGNOSIS — L909 Atrophic disorder of skin, unspecified: Secondary | ICD-10-CM | POA: Diagnosis not present

## 2024-07-13 ENCOUNTER — Telehealth: Payer: Self-pay

## 2024-07-13 NOTE — Telephone Encounter (Signed)
 Patient assistance medication arrived.  2 boxes of Tresiba  ready for pick up.  Patient called and is aware.  Will pick up today.

## 2024-07-13 NOTE — Telephone Encounter (Signed)
 Patient also has boxes of needles at the front desk.

## 2024-07-13 NOTE — Telephone Encounter (Signed)
Pt has picked up medication.  

## 2024-07-17 ENCOUNTER — Other Ambulatory Visit: Payer: Self-pay | Admitting: Nurse Practitioner

## 2024-07-17 DIAGNOSIS — F5101 Primary insomnia: Secondary | ICD-10-CM

## 2024-07-17 NOTE — Telephone Encounter (Unsigned)
 Copied from CRM [id number]. Topic: Clinical - Medication Refill >> Jul 17, 2024  1:15 PM Daniela A wrote: Medication: Eszopiclone  3 MG TABS  Has the patient contacted their pharmacy? No (Agent: If no, request that the patient contact the pharmacy for the refill. If patient does not wish to contact the pharmacy document the reason why and proceed with request.) (Agent: If yes, when and what did the pharmacy advise?)  This is the patient's preferred pharmacy:  [id number] [place], [place] - [address] [place] [address] Phone: [phone] Fax: [phone]  Is this the correct pharmacy for this prescription? Yes If no, delete pharmacy and type the correct one.   Has the prescription been filled recently? Yes - 06/16/24  Is the patient out of the medication? Yes - has two left  Has the patient been seen for an appointment in the last year OR does the patient have an upcoming appointment? Yes - patient has appt with Comer Rouleau on 09/29, would like a supply to last her until her appointment   Can we respond through MyChart? Yes  Agent: Please be advised that Rx refills may take up to 3 business days. We ask that you follow-up with your pharmacy.

## 2024-07-18 MED ORDER — ESZOPICLONE 3 MG PO TABS: 3.0000 mg | ORAL_TABLET | Freq: Every day | ORAL | 0 refills | Status: DC

## 2024-07-18 NOTE — Telephone Encounter (Signed)
**Note De-identified  [place] Obfuscation** Please advise 

## 2024-07-20 ENCOUNTER — Ambulatory Visit (INDEPENDENT_AMBULATORY_CARE_PROVIDER_SITE_OTHER)

## 2024-07-20 ENCOUNTER — Ambulatory Visit
Admission: RE | Admit: 2024-07-20 | Discharge: 2024-07-20 | Disposition: A | Discharge status: Home / Self Care | Source: Ambulatory Visit

## 2024-07-20 VITALS — BP 130/66 | HR 76 | Temp 98.2°F | Resp 18

## 2024-07-20 DIAGNOSIS — I700 Atherosclerosis of aorta: Secondary | ICD-10-CM | POA: Diagnosis not present

## 2024-07-20 DIAGNOSIS — J0100 Acute maxillary sinusitis, unspecified: Secondary | ICD-10-CM

## 2024-07-20 DIAGNOSIS — R0989 Other specified symptoms and signs involving the circulatory and respiratory systems: Secondary | ICD-10-CM | POA: Diagnosis not present

## 2024-07-20 MED ORDER — AMOXICILLIN-POT CLAVULANATE 875-125 MG PO TABS: 1.0000 | ORAL_TABLET | Freq: Two times a day (BID) | ORAL | 0 refills | Status: DC

## 2024-07-20 NOTE — ED Provider Notes (Signed)
 [facility]  Note:  This document was prepared using Conservation officer, historic buildings and may include unintentional dictation errors.  MRN: [id number] DOB: January 30, 1951  Subjective:   Ashley Gross is a 73 y.o. female presenting for nasal congestion, mild cough, chest congestion, sinus pressure x 9 days.  Patient reports that she was previously seen by her primary care provider who put her on amoxicillin  but after these symptoms initially improved they then came back worse than before.  Patient reports maxillary sinus pressure and pain and bilateral ear pressure.  Patient denies any known fever, production with cough, body aches, fatigue, shortness of breath, chest pain.  Patient reports using some over-the-counter cough and cold medication with minimal improvement.  No current facility-administered medications for this encounter.  Current Outpatient Medications:    acetaminophen  (TYLENOL ) 500 MG tablet, Take 2 tablets (1,000 mg total) by mouth every 6 (six) hours as needed for mild pain., Disp: , Rfl:    albuterol  (VENTOLIN  HFA) 108 (90 Base) MCG/ACT inhaler, Inhale 2 puffs into the lungs every 6 (six) hours as needed for wheezing or shortness of breath., Disp: , Rfl:    amLODipine  (NORVASC ) 10 MG tablet, Take 1 tablet (10 mg total) by mouth daily., Disp: 30 tablet, Rfl: 3   amoxicillin -clavulanate (AUGMENTIN ) 875-125 MG tablet, Take 1 tablet by mouth every 12 (twelve) hours., Disp: 14 tablet, Rfl: 0   Azelastine  HCl 137 MCG/SPRAY SOLN, PLACE 2 SPRAYS INTO BOTH NOSTRILS 2 (TWO) TIMES DAILY. USE IN EACH NOSTRIL AS DIRECTED, Disp: 90 mL, Rfl: 1   cetirizine (ZYRTEC) 10 MG tablet, Take 10 mg by mouth daily., Disp: , Rfl:    Cholecalciferol  (VITAMIN D ) 50 MCG (2000 UT) tablet, Take 2,000 Units by mouth daily., Disp: , Rfl:    CREON  36000-114000 units CPEP capsule, Take 1-2 capsules (36,000-72,000 Units total) by mouth See admin instructions. Take 720000 units with each meal and  36000 units with snacks, Disp: 240 capsule, Rfl: 3   Eszopiclone  3 MG TABS, Take 1 tablet (3 mg total) by mouth at bedtime. Take immediately before bedtime, Disp: 30 tablet, Rfl: 0   insulin  degludec (TRESIBA  FLEXTOUCH) 100 UNIT/ML FlexTouch Pen, Inject 16 Units into the skin daily., Disp: 15 mL, Rfl: 1   loperamide  (IMODIUM ) 2 MG capsule, Take 4 mg by mouth daily., Disp: , Rfl:    losartan  (COZAAR ) 100 MG tablet, TAKE 1 TABLET(100 MG) BY MOUTH AT BEDTIME, Disp: 90 tablet, Rfl: 3   mometasone  (ELOCON ) 0.1 % ointment, Apply 1 Application topically 2 (two) times daily., Disp: , Rfl:    omeprazole  (PRILOSEC) 20 MG capsule, TAKE 1 CAPSULE(20 MG) BY MOUTH TWICE DAILY, Disp: 180 capsule, Rfl: 3   ONE TOUCH ULTRA TEST test strip, PATIENT NEEDS NEW METER STRIPS AND LANCETS FOR ONE TOUCH. HER INSURANCE NO LONGER COVERS ACCUCHEK., Disp: 100 each, Rfl: PRN   potassium chloride  (KLOR-CON  M) 10 MEQ tablet, TAKE 1 TABLET BY MOUTH EVERY DAY, Disp: 30 tablet, Rfl: 0   rosuvastatin  (CRESTOR ) 10 MG tablet, Take 1 tablet (10 mg total) by mouth daily., Disp: 90 tablet, Rfl: 3   vedolizumab  (ENTYVIO ) 300 MG injection, Inject 300 mg into the vein 4 (four) times a week., Disp: , Rfl:    WIXELA INHUB 100-50 MCG/ACT AEPB, INHALE 1 PUFF INTO THE LUNGS TWICE A DAY, Disp: 60 each, Rfl: 3   Allergies  Allergen Reactions   Aspirin  Other (See Comments)    Increased bleeding   Beta Adrenergic Blockers  3rd degree blockage    Cobalamin Combinations Other (See Comments)    Respiratory symptoms  Respiratory symptoms   Lisinopril  Cough   Mtx Support [Cobalamine Combinations] Other (See Comments)    Respiratory symptoms   Nsaids    Vasotec [Enalapril] Cough   Verapamil  Other (See Comments)    Heart block   Voltaren [Diclofenac Sodium]     Pt unsure of reaction    Erythromycin  Other (See Comments)    Gi intolerance   Indocin [Indomethacin]     Upset stomach   Jardiance  [Empagliflozin ] Other (See Comments)     Recurrent yeast infections, even with washout and rechallenge    Past Medical History:  Diagnosis Date   Abnormal liver function test 10/06/2012   Allergy 1986   Anemia 01/25/2022   Anemia, unspecified    Asthma 1989   B12 deficiency 02/11/2019   Bleeding internal hemorrhoids 09/13/2017   Cataract 2009   Choledocholithiasis 06/29/2012   Formatting of this note might be different from the original. Dilated CBD on abdominal imaging 04/2012   Chronic diarrhea 02/16/2013   Clotting disorder (HCC) 2019   Colitis    Complication of anesthesia    asthma attack after shoulder surgery   COPD (chronic obstructive pulmonary disease) (HCC) 2016   Diabetes mellitus (HCC)    type II   Esophagitis    GERD (gastroesophageal reflux disease)    Heart murmur 1952   for years- nothing to be concerned about.   History of kidney stones    Hx of arteriovenous malformation (AVM)    Hypertension    IBS (irritable bowel syndrome)    Loss of weight    Neuromuscular disorder (HCC) 1988   Peripheral vascular disease (HCC)    Pernicious anemia    Personal history of colonic polyps 02/10/2013   02/08/13 colonoscopy - question of rectal varices, two 3-43mm polyps in the sigmoid colon, mass at the hepatic flexure, one 5mm polyp at the hepatic flexure, nodule at the ileocecal valve, congested mucusa in the entire colon, flattened villi mucosa in the terminal ileum.     Pneumonia    Psoriatic arthritis (HCC)    s/p penicillamine, plaquenil, MTX, sulfasalazine.  s/p Embrel, Humira,.  Iritis.     Pure hypercholesterolemia    Rectal bleeding 07/15/2017   Sleep apnea 04/2023   Stroke [facility]) 2023   per patient     Past Surgical History:  Procedure Laterality Date   ABDOMINAL HYSTERECTOMY  11/03/1979   ovaries left in place   ANKLE SURGERY     right   BRAIN SURGERY  November 2023   BUNIONECTOMY     CHOLECYSTECTOMY  11/03/1987   COLONOSCOPY WITH PROPOFOL  N/A 12/27/2017   Procedure: COLONOSCOPY WITH  PROPOFOL ;  Surgeon: Unk Corinn Skiff, MD;  Location: [facility] ENDOSCOPY;  Service: Gastroenterology;  Laterality: N/A;   COLONOSCOPY WITH PROPOFOL  N/A 01/04/2019   Procedure: COLONOSCOPY WITH PROPOFOL ;  Surgeon: Unk Corinn Skiff, MD;  Location: [facility];  Service: Endoscopy;  Laterality: N/A;  Diabetic - oral and injectable   COLONOSCOPY WITH PROPOFOL  N/A 07/24/2021   Procedure: COLONOSCOPY WITH PROPOFOL ;  Surgeon: Unk Corinn Skiff, MD;  Location: [facility];  Service: Endoscopy;  Laterality: N/A;  Diabetic   ESOPHAGOGASTRODUODENOSCOPY (EGD) WITH PROPOFOL  N/A 12/27/2017   Procedure: ESOPHAGOGASTRODUODENOSCOPY (EGD) WITH PROPOFOL ;  Surgeon: Unk Corinn Skiff, MD;  Location: [facility] ENDOSCOPY;  Service: Gastroenterology;  Laterality: N/A;   EYE SURGERY  2011 2019   HAND SURGERY  right   HEEL SPUR SURGERY     HERNIA REPAIR  1977& 2023   IR 3D INDEPENDENT WKST  10/01/2021   IR ANGIO INTRA EXTRACRAN SEL COM CAROTID INNOMINATE UNI L MOD SED  10/01/2021   IR ANGIO INTRA EXTRACRAN SEL INTERNAL CAROTID UNI R MOD SED  10/01/2021   IR ANGIO INTRA EXTRACRAN SEL INTERNAL CAROTID UNI R MOD SED  05/29/2022   IR ANGIO VERTEBRAL SEL VERTEBRAL BILAT MOD SED  10/01/2021   IR ANGIO VERTEBRAL SEL VERTEBRAL UNI R MOD SED  05/29/2022   IR CT HEAD LTD  10/01/2021   IR TRANSCATH/EMBOLIZ  10/01/2021   IR US  GUIDE VASC ACCESS RIGHT  10/01/2021   IR US  GUIDE VASC ACCESS RIGHT  05/29/2022   NOSE SURGERY     x2   POLYPECTOMY  01/04/2019   Procedure: POLYPECTOMY;  Surgeon: Unk Corinn Skiff, MD;  Location: [facility];  Service: Endoscopy;;   POLYPECTOMY N/A 07/24/2021   Procedure: POLYPECTOMY;  Surgeon: Unk Corinn Skiff, MD;  Location: [facility];  Service: Endoscopy;  Laterality: N/A;   RADIOLOGY WITH ANESTHESIA N/A 10/01/2021   Procedure: HENDERSON WITH ANESTHESIA;  Surgeon: de Macedo Rodrigues, Katyucia, MD;  Location: [facility] OR;  Service: Radiology;  Laterality:  N/A;   SHOULDER ARTHROSCOPY WITH OPEN ROTATOR CUFF REPAIR Left 09/20/2018   Procedure: SHOULDER ARTHROSCOPY WITH OPEN ROTATOR CUFF REPAIR;  Surgeon: Edie Norleen PARAS, MD;  Location: [facility] ORS;  Service: Orthopedics;  Laterality: Left;   SHOULDER CLOSED REDUCTION Left 12/21/2018   Procedure: MANIPULATION UNDER ANESTHESIA WITH STEROID INJECTION;  Surgeon: Edie Norleen PARAS, MD;  Location: [facility];  Service: Orthopedics;  Laterality: Left;  Diabetic - insulin  and oral meds   UMBILICAL HERNIA REPAIR     XI ROBOTIC ASSISTED INGUINAL HERNIA REPAIR WITH MESH Left 12/16/2021   Procedure: XI ROBOTIC ASSISTED INGUINAL HERNIA REPAIR WITH MESH;  Surgeon: Desiderio Schanz, MD;  Location: [facility] ORS;  Service: General;  Laterality: Left;    Family History  Problem Relation Age of Onset   Diabetes Mother    Hypertension Mother    Arthritis Father    Diabetes Father    Heart disease Father    Hypertension Father    Diabetes Other    Hypertension Other    ADD / ADHD Daughter    Asthma Daughter    ADD / ADHD Daughter    Asthma Daughter    Breast cancer Neg Hx    Colon cancer Neg Hx     Social History   Tobacco Use   Smoking status: Never    Passive exposure: Never   Smokeless tobacco: Never  Vaping Use   Vaping status: Never Used  Substance Use Topics   Alcohol use: Never   Drug use: Never    ROS Refer to HPI for ROS details.  Objective:   Vitals: BP 130/66 (BP Location: Left Arm)   Pulse 76   Temp 98.2 F (36.8 C) (Oral)   Resp 18   SpO2 96%   Physical Exam Vitals and nursing note reviewed.  Constitutional:      General: She is not in acute distress.    Appearance: Normal appearance. She is well-developed. She is not ill-appearing or toxic-appearing.  HENT:     Head: Normocephalic and atraumatic.     Nose: Congestion present. No rhinorrhea.     Mouth/Throat:     Mouth: Mucous membranes are moist.     Pharynx: Oropharynx is clear.  Cardiovascular:  Rate and Rhythm:  Normal rate and regular rhythm.     Heart sounds: Normal heart sounds. No murmur heard. Pulmonary:     Effort: Pulmonary effort is normal. No respiratory distress.     Breath sounds: Normal breath sounds. No stridor. No wheezing, rhonchi or rales.  Chest:     Chest wall: No tenderness.  Skin:    General: Skin is warm and dry.  Neurological:     General: No focal deficit present.     Mental Status: She is alert and oriented to person, place, and time.  Psychiatric:        Mood and Affect: Mood normal.        Behavior: Behavior normal.     Procedures  No results found. However, due to the size of the patient record, not all encounters were searched. Please check Results Review for a complete set of results.  No results found.   Assessment and Plan :     Discharge Instructions       1. Acute non-recurrent maxillary sinusitis (Primary) - DG Chest 2 View x-ray completed in UC shows no acute cardiopulmonary processes, no sign of consolidation or pneumonia.  Clear chest x-ray. - amoxicillin -clavulanate (AUGMENTIN ) 875-125 MG tablet; Take 1 tablet by mouth every 12 (twelve) hours.  Dispense: 14 tablet; Refill: 0 -Continue to monitor symptoms for any change in severity if there is any escalation of current symptoms or development of new symptoms follow-up in ER for further evaluation and management.      Alice Burnside B Cherryl Babin   Brentley [place], [place] B, [place] 07/20/24 1459

## 2024-07-20 NOTE — Discharge Instructions (Addendum)
  1. Acute non-recurrent maxillary sinusitis (Primary) - DG Chest 2 View x-ray completed in UC shows no acute cardiopulmonary processes, no sign of consolidation or pneumonia.  Clear chest x-ray. - amoxicillin -clavulanate (AUGMENTIN ) 875-125 MG tablet; Take 1 tablet by mouth every 12 (twelve) hours.  Dispense: 14 tablet; Refill: 0 -Continue to monitor symptoms for any change in severity if there is any escalation of current symptoms or development of new symptoms follow-up in ER for further evaluation and management.

## 2024-07-20 NOTE — ED Triage Notes (Signed)
 Patient reports nasal congestion, cough and head feels full. Patient was seen at primary on last 07/11/24 and was placed on Amoxicillin . Patient completed antibiotics and said symptoms returned.

## 2024-07-31 ENCOUNTER — Ambulatory Visit: Admitting: Nurse Practitioner

## 2024-08-05 DIAGNOSIS — G4733 Obstructive sleep apnea (adult) (pediatric): Secondary | ICD-10-CM | POA: Diagnosis not present

## 2024-08-08 NOTE — Progress Notes (Signed)
 Ashley Gross                                          MRN: [id number]   08/08/2024   The [facility] Quality Team Specialist reviewed this patient medical record for the purposes of chart review for care gap closure. The following were reviewed: chart review for care gap closure-kidney health evaluation for diabetes:eGFR  and uACR.    [facility] Quality Team

## 2024-08-09 ENCOUNTER — Other Ambulatory Visit (INDEPENDENT_AMBULATORY_CARE_PROVIDER_SITE_OTHER)

## 2024-08-09 DIAGNOSIS — E1165 Type 2 diabetes mellitus with hyperglycemia: Secondary | ICD-10-CM | POA: Diagnosis not present

## 2024-08-09 DIAGNOSIS — E1121 Type 2 diabetes mellitus with diabetic nephropathy: Secondary | ICD-10-CM

## 2024-08-09 DIAGNOSIS — E7800 Pure hypercholesterolemia, unspecified: Secondary | ICD-10-CM | POA: Diagnosis not present

## 2024-08-09 DIAGNOSIS — Z794 Long term (current) use of insulin: Secondary | ICD-10-CM | POA: Diagnosis not present

## 2024-08-09 LAB — BASIC METABOLIC PANEL WITH GFR
BUN: 13 mg/dL (ref 6–23)
CO2: 31 meq/L (ref 19–32)
Calcium: 8.6 mg/dL (ref 8.4–10.5)
Chloride: 106 meq/L (ref 96–112)
Creatinine, Ser: 1.01 mg/dL (ref 0.40–1.20)
GFR: 55.21 mL/min — ABNORMAL LOW (ref >60.00)
Glucose, Bld: 70 mg/dL (ref 70–99)
Potassium: 4.2 meq/L (ref 3.5–5.1)
Sodium: 144 meq/L (ref 135–145)

## 2024-08-09 LAB — HEPATIC FUNCTION PANEL
ALT: 12 U/L (ref 0–35)
AST: 19 U/L (ref 0–37)
Albumin: 3.9 g/dL (ref 3.5–5.2)
Alkaline Phosphatase: 67 U/L (ref 39–117)
Bilirubin, Direct: 0.1 mg/dL (ref 0.0–0.3)
Total Bilirubin: 0.4 mg/dL (ref 0.2–1.2)
Total Protein: 5.8 g/dL — ABNORMAL LOW (ref 6.0–8.3)

## 2024-08-09 LAB — LIPID PANEL
Cholesterol: 132 mg/dL (ref 0–200)
HDL: 52 mg/dL (ref >39.00)
LDL Cholesterol: 66 mg/dL (ref 0–99)
NonHDL: 80.28
Total CHOL/HDL Ratio: 3
Triglycerides: 72 mg/dL (ref 0.0–149.0)
VLDL: 14.4 mg/dL (ref 0.0–40.0)

## 2024-08-09 LAB — MICROALBUMIN / CREATININE URINE RATIO
Creatinine,U: 105.2 mg/dL
Microalb Creat Ratio: 85.6 mg/g — ABNORMAL HIGH (ref 0.0–30.0)
Microalb, Ur: 9 mg/dL — ABNORMAL HIGH (ref 0.0–1.9)

## 2024-08-09 LAB — HEMOGLOBIN A1C: Hgb A1c MFr Bld: 6.1 % (ref 4.6–6.5)

## 2024-08-10 ENCOUNTER — Ambulatory Visit: Payer: Self-pay | Admitting: Internal Medicine

## 2024-08-11 ENCOUNTER — Ambulatory Visit: Admitting: Internal Medicine

## 2024-08-11 ENCOUNTER — Encounter: Payer: Self-pay | Admitting: Internal Medicine

## 2024-08-11 VITALS — BP 138/72 | HR 76 | Ht 64.0 in | Wt 147.0 lb

## 2024-08-11 DIAGNOSIS — J4520 Mild intermittent asthma, uncomplicated: Secondary | ICD-10-CM

## 2024-08-11 DIAGNOSIS — K219 Gastro-esophageal reflux disease without esophagitis: Secondary | ICD-10-CM

## 2024-08-11 DIAGNOSIS — E7800 Pure hypercholesterolemia, unspecified: Secondary | ICD-10-CM | POA: Diagnosis not present

## 2024-08-11 DIAGNOSIS — G4733 Obstructive sleep apnea (adult) (pediatric): Secondary | ICD-10-CM | POA: Diagnosis not present

## 2024-08-11 DIAGNOSIS — F439 Reaction to severe stress, unspecified: Secondary | ICD-10-CM | POA: Diagnosis not present

## 2024-08-11 DIAGNOSIS — F320 Major depressive disorder, single episode, mild: Secondary | ICD-10-CM

## 2024-08-11 DIAGNOSIS — D509 Iron deficiency anemia, unspecified: Secondary | ICD-10-CM

## 2024-08-11 DIAGNOSIS — E1165 Type 2 diabetes mellitus with hyperglycemia: Secondary | ICD-10-CM

## 2024-08-11 DIAGNOSIS — Z23 Encounter for immunization: Secondary | ICD-10-CM | POA: Diagnosis not present

## 2024-08-11 DIAGNOSIS — Z794 Long term (current) use of insulin: Secondary | ICD-10-CM | POA: Diagnosis not present

## 2024-08-11 DIAGNOSIS — Z1231 Encounter for screening mammogram for malignant neoplasm of breast: Secondary | ICD-10-CM

## 2024-08-11 DIAGNOSIS — M7989 Other specified soft tissue disorders: Secondary | ICD-10-CM | POA: Diagnosis not present

## 2024-08-11 DIAGNOSIS — I10 Essential (primary) hypertension: Secondary | ICD-10-CM | POA: Diagnosis not present

## 2024-08-11 DIAGNOSIS — K51419 Inflammatory polyps of colon with unspecified complications: Secondary | ICD-10-CM | POA: Diagnosis not present

## 2024-08-11 DIAGNOSIS — E237 Disorder of pituitary gland, unspecified: Secondary | ICD-10-CM

## 2024-08-11 DIAGNOSIS — L4050 Arthropathic psoriasis, unspecified: Secondary | ICD-10-CM

## 2024-08-11 DIAGNOSIS — D329 Benign neoplasm of meninges, unspecified: Secondary | ICD-10-CM

## 2024-08-11 MED ORDER — OMEPRAZOLE 20 MG PO CPDR: DELAYED_RELEASE_CAPSULE | ORAL | 3 refills | Status: DC

## 2024-08-11 MED ORDER — FLUTICASONE-SALMETEROL 100-50 MCG/ACT IN AEPB: INHALATION_SPRAY | RESPIRATORY_TRACT | 3 refills | Status: AC

## 2024-08-11 MED ORDER — HYDRALAZINE HCL 10 MG PO TABS: 10.0000 mg | ORAL_TABLET | Freq: Three times a day (TID) | ORAL | 2 refills | Status: AC

## 2024-08-11 MED ORDER — AMLODIPINE BESYLATE 5 MG PO TABS: 5.0000 mg | ORAL_TABLET | Freq: Every day | ORAL | 1 refills | Status: DC

## 2024-08-11 NOTE — Progress Notes (Signed)
 Subjective:    Patient ID: Ashley Gross, female    DOB: 1951/02/09, 73 y.o.   MRN: [id number]  Patient here for  Chief Complaint  Patient presents with   Medical Management of Chronic Issues    HPI Here for a scheduled follow up - follow up regarding diabetes, IDA, hypertension and hypercholesterolemia. Was seen 07/20/24 - diagnosed sinus infection - treated with augmentin . Evaluated 06/27/24 - acute visit - f/u elevated blood pressure. Amlodipine  was increased to 10mg  q day. Continues on losartan . 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 In 2023, she had a follow-up angiogram and unfortunately had some neurological deficits after that from which she has completely recovered. 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 The CT angiogram was recently done because the MR angiogram suggested recurrence however follow-up imaging confirmed that this was an artifact from the web device. The aneurysm is completely excluded. The meningioma and the pituitary adenoma are also unchanged. Recommended f/u MRA and MRI brain in 2 years.  Had f/u with Dr Unk 03/21/24 - changed entyvio  infusion to subcutaneous injections. Tolerating. Recommended to continue creon .  Seeing Dr Melanee for IDA. Last 12/21/23 - hgb and iron stable. Does report increased ankle swelling. Noticed more recently - increased dose of amlodipine . Breathing stable. Increased stress. Discussed. Does not feel needs psychiatry referral for therapist. No SI.    Past Medical History:  Diagnosis Date   Abnormal liver function test 10/06/2012   Allergy 1986   Anemia 01/25/2022   Anemia, unspecified    Asthma 1989   B12 deficiency 02/11/2019   Bleeding internal hemorrhoids 09/13/2017   Cataract 2009   Choledocholithiasis 06/29/2012   Formatting of this note might be different from the original. Dilated CBD on abdominal imaging 04/2012   Chronic  diarrhea 02/16/2013   Clotting disorder 2019   Colitis    Complication of anesthesia    asthma attack after shoulder surgery   COPD (chronic obstructive pulmonary disease) (HCC) 2016   Diabetes mellitus (HCC)    type II   Esophagitis    GERD (gastroesophageal reflux disease)    Heart murmur 1952   for years- nothing to be concerned about.   History of kidney stones    Hx of arteriovenous malformation (AVM)    Hypertension    IBS (irritable bowel syndrome)    Loss of weight    Neuromuscular disorder (HCC) 1988   Peripheral vascular disease    Pernicious anemia    Personal history of colonic polyps 02/10/2013   02/08/13 colonoscopy - question of rectal varices, two 3-2mm polyps in the sigmoid colon, mass at the hepatic flexure, one 5mm polyp at the hepatic flexure, nodule at the ileocecal valve, congested mucusa in the entire colon, flattened villi mucosa in the terminal ileum.     Pneumonia    Psoriatic arthritis (HCC)    s/p penicillamine, plaquenil, MTX, sulfasalazine.  s/p Embrel, Humira,.  Iritis.     Pure hypercholesterolemia    Rectal bleeding 07/15/2017   Sleep apnea 04/2023   Stroke [facility]) 2023   per patient   Past Surgical History:  Procedure Laterality Date   ABDOMINAL HYSTERECTOMY  1981   ovaries left in place   ANKLE SURGERY     right   BRAIN SURGERY  November 2023   BUNIONECTOMY     CHOLECYSTECTOMY  1989   COLONOSCOPY WITH PROPOFOL  N/A 12/27/2017  Procedure: COLONOSCOPY WITH PROPOFOL ;  Surgeon: Unk Corinn Skiff, MD;  Location: [facility] ENDOSCOPY;  Service: Gastroenterology;  Laterality: N/A;   COLONOSCOPY WITH PROPOFOL  N/A 01/04/2019   Procedure: COLONOSCOPY WITH PROPOFOL ;  Surgeon: Unk Corinn Skiff, MD;  Location: [facility];  Service: Endoscopy;  Laterality: N/A;  Diabetic - oral and injectable   COLONOSCOPY WITH PROPOFOL  N/A 07/24/2021   Procedure: COLONOSCOPY WITH PROPOFOL ;  Surgeon: Unk Corinn Skiff, MD;  Location: [facility];   Service: Endoscopy;  Laterality: N/A;  Diabetic   ESOPHAGOGASTRODUODENOSCOPY (EGD) WITH PROPOFOL  N/A 12/27/2017   Procedure: ESOPHAGOGASTRODUODENOSCOPY (EGD) WITH PROPOFOL ;  Surgeon: Unk Corinn Skiff, MD;  Location: [facility] ENDOSCOPY;  Service: Gastroenterology;  Laterality: N/A;   EYE SURGERY  2011 2019   HAND SURGERY     right   HEEL SPUR SURGERY     HERNIA REPAIR  1977& 2023   IR 3D INDEPENDENT WKST  10/01/2021   IR ANGIO INTRA EXTRACRAN SEL COM CAROTID INNOMINATE UNI L MOD SED  10/01/2021   IR ANGIO INTRA EXTRACRAN SEL INTERNAL CAROTID UNI R MOD SED  10/01/2021   IR ANGIO INTRA EXTRACRAN SEL INTERNAL CAROTID UNI R MOD SED  05/29/2022   IR ANGIO VERTEBRAL SEL VERTEBRAL BILAT MOD SED  10/01/2021   IR ANGIO VERTEBRAL SEL VERTEBRAL UNI R MOD SED  05/29/2022   IR CT HEAD LTD  10/01/2021   IR TRANSCATH/EMBOLIZ  10/01/2021   IR US  GUIDE VASC ACCESS RIGHT  10/01/2021   IR US  GUIDE VASC ACCESS RIGHT  05/29/2022   NOSE SURGERY     x2   POLYPECTOMY  01/04/2019   Procedure: POLYPECTOMY;  Surgeon: Unk Corinn Skiff, MD;  Location: [facility] SUR[facility]ERY [facility];  Service: Endoscopy;;   POLYPECTOMY N/A 07/24/2021   Procedure: POLYPECTOMY;  Surgeon: Unk Corinn Skiff, MD;  Location: [facility]ERY [facility];  Service: Endoscopy;  Laterality: N/A;   RADIOLOGY WITH ANESTHESIA N/A 10/01/2021   Procedure: HENDERSON WITH ANESTHESIA;  Surgeon: de Macedo Rodrigues, Katyucia, MD;  Location: [facility] OR;  Service: Radiology;  Laterality: N/A;   SHOULDER ARTHROSCOPY WITH OPEN ROTATOR CUFF REPAIR Left 09/20/2018   Procedure: SHOULDER ARTHROSCOPY WITH OPEN ROTATOR CUFF REPAIR;  Surgeon: Edie Norleen PARAS, MD;  Location: [facility] ORS;  Service: Orthopedics;  Laterality: Left;   SHOULDER CLOSED REDUCTION Left 12/21/2018   Procedure: MANIPULATION UNDER ANESTHESIA WITH STEROID INJECTION;  Surgeon: Edie Norleen PARAS, MD;  Location: [facility]ERY [facility];  Service: Orthopedics;  Laterality: Left;  Diabetic - insulin  and oral meds   UMBILICAL  HERNIA REPAIR     XI ROBOTIC ASSISTED INGUINAL HERNIA REPAIR WITH MESH Left 12/16/2021   Procedure: XI ROBOTIC ASSISTED INGUINAL HERNIA REPAIR WITH MESH;  Surgeon: Desiderio Schanz, MD;  Location: [facility] ORS;  Service: General;  Laterality: Left;   Family History  Problem Relation Age of Onset   Diabetes Mother    Hypertension Mother    Arthritis Father    Diabetes Father    Heart disease Father    Hypertension Father    Diabetes Other    Hypertension Other    ADD / ADHD Daughter    Asthma Daughter    ADD / ADHD Daughter    Asthma Daughter    Breast cancer Neg Hx    Colon cancer Neg Hx    Social History   Socioeconomic History   Marital status: Widowed    Spouse name: Not on file   Number of children: 2   Years of education: Not on file  Highest education level: Associate degree: occupational, Scientist, product/process development, or vocational program  Occupational History   Not on file  Tobacco Use   Smoking status: Never    Passive exposure: Never   Smokeless tobacco: Never  Vaping Use   Vaping status: Never Used  Substance and Sexual Activity   Alcohol use: Never   Drug use: Never   Sexual activity: Not Currently  Other Topics Concern   Not on file  Social History Narrative   She is married and has two children   Grand daughter lives with her.   Social Drivers of Health   Financial Resource Strain: Medium Risk (04/24/2024)   Overall Financial Resource Strain (CARDIA)    Difficulty of Paying Living Expenses: Somewhat hard  Food Insecurity: Food Insecurity Present (04/24/2024)   Hunger Vital Sign    Worried About Running Out of Food in the Last Year: Sometimes true    Ran Out of Food in the Last Year: Never true  Transportation Needs: No Transportation Needs (04/24/2024)   PRAPARE - Administrator, Civil Service (Medical): No    Lack of Transportation (Non-Medical): No  Physical Activity: Insufficiently Active (04/24/2024)   Exercise Vital Sign    Days of Exercise per Week: 2  days    Minutes of Exercise per Session: 20 min  Stress: Stress Concern Present (04/24/2024)   [employer] of Occupational Health - Occupational Stress Questionnaire    Feeling of Stress: To some extent  Social Connections: Moderately Integrated (04/24/2024)   Social Connection and Isolation Panel    Frequency of Communication with Friends and Family: More than three times a week    Frequency of Social Gatherings with Friends and Family: More than three times a week    Attends Religious Services: More than 4 times per year    Active Member of [employer] or Organizations: Yes    Attends Banker Meetings: More than 4 times per year    Marital Status: Widowed     Review of Systems  Constitutional:  Negative for appetite change and unexpected weight change.  HENT:  Negative for congestion and sinus pressure.   Respiratory:  Negative for cough, chest tightness and shortness of breath.   Cardiovascular:  Negative for chest pain and palpitations.       Reports ankle swelling as outlined.   Gastrointestinal:  Negative for abdominal pain and vomiting.  Genitourinary:  Negative for difficulty urinating and dysuria.  Musculoskeletal:  Negative for joint swelling and myalgias.  Skin:  Negative for color change and rash.  Neurological:  Negative for dizziness and headaches.  Psychiatric/Behavioral:  Negative for agitation and dysphoric mood.        Increased stress as outlined.        Objective:     BP 138/72   Pulse 76   Ht 5' 4 (1.626 m)   Wt 147 lb (66.7 kg)   SpO2 95%   BMI 25.23 kg/m  Wt Readings from Last 3 Encounters:  08/11/24 147 lb (66.7 kg)  06/27/24 149 lb 4 oz (67.7 kg)  06/14/24 150 lb (68 kg)    Physical Exam Vitals reviewed.  Constitutional:      General: She is not in acute distress.    Appearance: Normal appearance.  HENT:     Head: Normocephalic and atraumatic.     Right Ear: External ear normal.     Left Ear: External ear normal.      Mouth/Throat:  Pharynx: No oropharyngeal exudate or posterior oropharyngeal erythema.  Eyes:     General: No scleral icterus.       Right eye: No discharge.        Left eye: No discharge.     Conjunctiva/sclera: Conjunctivae normal.  Neck:     Thyroid : No thyromegaly.  Cardiovascular:     Rate and Rhythm: Normal rate and regular rhythm.     Comments: 1-2/6 systolic murmur.  Pulmonary:     Effort: No respiratory distress.     Breath sounds: Normal breath sounds. No wheezing.  Abdominal:     General: Bowel sounds are normal.     Palpations: Abdomen is soft.     Tenderness: There is no abdominal tenderness.  Musculoskeletal:        General: No swelling or tenderness.     Cervical back: Neck supple. No tenderness.  Lymphadenopathy:     Cervical: No cervical adenopathy.  Skin:    Findings: No erythema or rash.  Neurological:     Mental Status: She is alert.  Psychiatric:        Mood and Affect: Mood normal.        Behavior: Behavior normal.         Outpatient Encounter Medications as of 08/11/2024  Medication Sig   acetaminophen  (TYLENOL ) 500 MG tablet Take 2 tablets (1,000 mg total) by mouth every 6 (six) hours as needed for mild pain.   albuterol  (VENTOLIN  HFA) 108 (90 Base) MCG/ACT inhaler Inhale 2 puffs into the lungs every 6 (six) hours as needed for wheezing or shortness of breath.   amLODipine  (NORVASC ) 5 MG tablet Take 1 tablet (5 mg total) by mouth daily.   Azelastine  HCl 137 MCG/SPRAY SOLN PLACE 2 SPRAYS INTO BOTH NOSTRILS 2 (TWO) TIMES DAILY. USE IN EACH NOSTRIL AS DIRECTED   cetirizine (ZYRTEC) 10 MG tablet Take 10 mg by mouth daily.   Cholecalciferol  (VITAMIN D ) 50 MCG (2000 UT) tablet Take 2,000 Units by mouth daily.   CREON  36000-114000 units CPEP capsule Take 1-2 capsules (36,000-72,000 Units total) by mouth See admin instructions. Take 720000 units with each meal and 36000 units with snacks   Eszopiclone  3 MG TABS Take 1 tablet (3 mg total) by mouth at  bedtime. Take immediately before bedtime   hydrALAZINE (APRESOLINE) 10 MG tablet Take 1 tablet (10 mg total) by mouth 3 (three) times daily.   insulin  degludec (TRESIBA  FLEXTOUCH) 100 UNIT/ML FlexTouch Pen Inject 16 Units into the skin daily.   loperamide  (IMODIUM ) 2 MG capsule Take 4 mg by mouth daily.   losartan  (COZAAR ) 100 MG tablet TAKE 1 TABLET(100 MG) BY MOUTH AT BEDTIME   mometasone  (ELOCON ) 0.1 % ointment Apply 1 Application topically 2 (two) times daily.   ONE TOUCH ULTRA TEST test strip PATIENT NEEDS NEW METER STRIPS AND LANCETS FOR ONE TOUCH. HER INSURANCE NO LONGER COVERS ACCUCHEK.   potassium chloride  (KLOR-CON  M) 10 MEQ tablet TAKE 1 TABLET BY MOUTH EVERY DAY   rosuvastatin  (CRESTOR ) 10 MG tablet Take 1 tablet (10 mg total) by mouth daily.   vedolizumab  (ENTYVIO ) 300 MG injection Inject 300 mg into the vein 4 (four) times a week.   [DISCONTINUED] amLODipine  (NORVASC ) 10 MG tablet Take 1 tablet (10 mg total) by mouth daily.   fluticasone -salmeterol (WIXELA INHUB) 100-50 MCG/ACT AEPB INHALE 1 PUFF INTO THE LUNGS TWICE A DAY   omeprazole  (PRILOSEC) 20 MG capsule TAKE 1 CAPSULE(20 MG) BY MOUTH TWICE DAILY   [DISCONTINUED] amoxicillin -clavulanate (AUGMENTIN )  875-125 MG tablet Take 1 tablet by mouth every 12 (twelve) hours.   [DISCONTINUED] omeprazole  (PRILOSEC) 20 MG capsule TAKE 1 CAPSULE(20 MG) BY MOUTH TWICE DAILY   [DISCONTINUED] WIXELA INHUB 100-50 MCG/ACT AEPB INHALE 1 PUFF INTO THE LUNGS TWICE A DAY   No facility-administered encounter medications on file as of 08/11/2024.     Lab Results  Component Value Date   WBC 6.4 05/04/2024   HGB 14.0 05/04/2024   HCT 43.8 05/04/2024   PLT 250 05/04/2024   GLUCOSE 70 08/09/2024   CHOL 132 08/09/2024   TRIG 72.0 08/09/2024   HDL 52.00 08/09/2024   LDLDIRECT 74.0 04/19/2019   LDLCALC 66 08/09/2024   ALT 12 08/09/2024   AST 19 08/09/2024   NA 144 08/09/2024   K 4.2 08/09/2024   CL 106 08/09/2024   CREATININE 1.01 08/09/2024    BUN 13 08/09/2024   CO2 31 08/09/2024   TSH 2.94 04/05/2024   INR 0.9 05/04/2024   HGBA1C 6.1 08/09/2024   MICROALBUR 9.0 (H) 08/09/2024    DG Chest 2 View Result Date: 07/20/2024 CLINICAL DATA:  Chest congestion. EXAM: CHEST - 2 VIEW COMPARISON:  Chest radiograph dated 03/20/2024. FINDINGS: No focal consolidation, pleural effusion or pneumothorax. The cardiac silhouette is within normal limits. Atherosclerotic calcification of the aortic arch. No acute osseous pathology. IMPRESSION: No active cardiopulmonary disease. Electronically Signed   By: Vanetta Chou M.D.   On: 07/20/2024 15:04       Assessment & Plan:  Type 2 diabetes mellitus with hyperglycemia, with long-term current use of insulin  (HCC) Assessment & Plan: Continues on tresiba . Follow met b and A1c.  Reviewed recent labs.  A1c improved.  Lab Results  Component Value Date   HGBA1C 6.1 08/09/2024     Orders: -     Hemoglobin A1c; Future -     Basic metabolic panel with GFR; Future  Hypercholesterolemia Assessment & Plan: Continue crestor . Follow lipid panel and liver function tests.  Lab Results  Component Value Date   CHOL 132 08/09/2024   HDL 52.00 08/09/2024   LDLCALC 66 08/09/2024   LDLDIRECT 74.0 04/19/2019   TRIG 72.0 08/09/2024   CHOLHDL 3 08/09/2024       Orders: -     Hepatic function panel; Future -     Lipid panel; Future  Encounter for screening mammogram for malignant neoplasm of breast -     3D Screening Mammogram, Left and Right; Future  Need for influenza vaccination -     Flu vaccine HIGH DOSE PF(Fluzone Trivalent)  Swelling of lower extremity Assessment & Plan: Lower extremity swelling. Recently increased amlodipine  to 10mg  q day. Increased swelling as outlined. Will decrease amlodipine  10mg  to 5mg  q day. Add hydralazine. Conitnue losartan . Follow pressures. Compression hose.    Stress Assessment & Plan: Increased stress. Discussed. She does not feel needs any further  intervention at this tie. Follow.    Severe obstructive sleep apnea Assessment & Plan: Continue cpap.    Iron deficiency anemia, unspecified iron deficiency anemia type Assessment & Plan:  Seeing Dr Melanee for IDA. Last 12/21/23 - hgb and iron stable   Mild intermittent asthma without complication Assessment & Plan: Breathing stable.    Depression, major, single episode, mild Assessment & Plan: Increased stress. Discussed. She does not feel needs further intervention.    Gastroesophageal reflux disease, unspecified whether esophagitis present Assessment & Plan: Continue prilosec.    Primary hypertension Assessment & Plan: On amlodipine  10mg . Also on losartan .  Given increased swelling, will decrease amlodipine  to 5mg  q day. Add hydralazine 10mg  tid. Follow pressures. Follow metabolic panel.    Pseudopolyposis of colon with complication, unspecified part of colon [facility]) Assessment & Plan: S/p colonoscopy 07/24/21 as outlined.  Continues f/u with GI.  On entyvio . Planning to change to injections. Continue f/u with GI.    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 In 2023, she had a follow-up angiogram and unfortunately had some neurological deficits after that from which she has completely recovered. 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 The CT angiogram was recently done because the MR angiogram suggested recurrence however follow-up imaging confirmed that this was an artifact from the web device. The aneurysm is completely excluded. The meningioma and the pituitary adenoma are also unchanged. Recommended f/u MRA and MRI brain in 2 years.   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 In 2023, she had a follow-up angiogram  and unfortunately had some neurological deficits after that from which she has completely recovered. 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 The CT angiogram was recently done because the MR angiogram suggested recurrence however follow-up imaging confirmed that this was an artifact from the web device. The aneurysm is completely excluded. The meningioma and the pituitary adenoma are also unchanged. Recommended f/u MRA and MRI brain in 2 years.   Psoriatic arthritis (HCC) Assessment & Plan: Followed by rheumatology. Stable.    Other orders -     Omeprazole ; TAKE 1 CAPSULE(20 MG) BY MOUTH TWICE DAILY  Dispense: 180 capsule; Refill: 3 -     Fluticasone -Salmeterol; INHALE 1 PUFF INTO THE LUNGS TWICE A DAY  Dispense: 60 each; Refill: 3 -     amLODIPine  Besylate; Take 1 tablet (5 mg total) by mouth daily.  Dispense: 90 tablet; Refill: 1 -     hydrALAZINE HCl; Take 1 tablet (10 mg total) by mouth 3 (three) times daily.  Dispense: 90 tablet; Refill: 2     Allena Hamilton, MD

## 2024-08-11 NOTE — Patient Instructions (Signed)
 Decrease amlodipine  to 5mg  per day.   Start hydralazine 10mg  - one tablet three times per day.

## 2024-08-12 ENCOUNTER — Encounter: Payer: Self-pay | Admitting: Internal Medicine

## 2024-08-12 NOTE — Assessment & Plan Note (Signed)
 Increased stress. Discussed. She does not feel needs any further intervention at this tie. Follow.

## 2024-08-12 NOTE — Assessment & Plan Note (Signed)
Followed by rheumatology.  Stable.  

## 2024-08-12 NOTE — Assessment & Plan Note (Signed)
 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 In 2023, she had a follow-up angiogram and unfortunately had some neurological deficits after that from which she has completely recovered. 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 The CT angiogram was recently done because the MR angiogram suggested recurrence however follow-up imaging confirmed that this was an artifact from the web device. The aneurysm is completely excluded. The meningioma and the pituitary adenoma are also unchanged. Recommended f/u MRA and MRI brain in 2 years.

## 2024-08-12 NOTE — Assessment & Plan Note (Signed)
 Continue cpap.

## 2024-08-12 NOTE — Assessment & Plan Note (Signed)
 Breathing stable.

## 2024-08-12 NOTE — Assessment & Plan Note (Signed)
 Increased stress. Discussed. She does not feel needs further intervention.

## 2024-08-12 NOTE — Assessment & Plan Note (Signed)
 S/p colonoscopy 07/24/21 as outlined.  Continues f/u with GI.  On entyvio . Planning to change to injections. Continue f/u with GI.

## 2024-08-12 NOTE — Assessment & Plan Note (Signed)
 Continue crestor . Follow lipid panel and liver function tests.  Lab Results  Component Value Date   CHOL 132 08/09/2024   HDL 52.00 08/09/2024   LDLCALC 66 08/09/2024   LDLDIRECT 74.0 04/19/2019   TRIG 72.0 08/09/2024   CHOLHDL 3 08/09/2024

## 2024-08-12 NOTE — Assessment & Plan Note (Signed)
 Continue prilosec

## 2024-08-12 NOTE — Assessment & Plan Note (Signed)
 Lower extremity swelling. Recently increased amlodipine  to 10mg  q day. Increased swelling as outlined. Will decrease amlodipine  10mg  to 5mg  q day. Add hydralazine. Conitnue losartan . Follow pressures. Compression hose.

## 2024-08-12 NOTE — Assessment & Plan Note (Signed)
 Seeing Dr Melanee for IDA. Last 12/21/23 - hgb and iron stable

## 2024-08-12 NOTE — Assessment & Plan Note (Signed)
 On amlodipine  10mg . Also on losartan . Given increased swelling, will decrease amlodipine  to 5mg  q day. Add hydralazine 10mg  tid. Follow pressures. Follow metabolic panel.

## 2024-08-12 NOTE — Assessment & Plan Note (Signed)
 Continues on tresiba . Follow met b and A1c.  Reviewed recent labs.  A1c improved.  Lab Results  Component Value Date   HGBA1C 6.1 08/09/2024

## 2024-08-12 NOTE — Assessment & Plan Note (Addendum)
 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 In 2023, she had a follow-up angiogram and unfortunately had some neurological deficits after that from which she has completely recovered. 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 The CT angiogram was recently done because the MR angiogram suggested recurrence however follow-up imaging confirmed that this was an artifact from the web device. The aneurysm is completely excluded. The meningioma and the pituitary adenoma are also unchanged. Recommended f/u MRA and MRI brain in 2 years.

## 2024-08-18 ENCOUNTER — Inpatient Hospital Stay: Payer: PPO | Attending: Internal Medicine

## 2024-08-18 ENCOUNTER — Encounter: Payer: Self-pay | Admitting: Oncology

## 2024-08-18 ENCOUNTER — Inpatient Hospital Stay: Payer: PPO | Admitting: Oncology

## 2024-08-18 ENCOUNTER — Other Ambulatory Visit: Payer: PPO

## 2024-08-18 ENCOUNTER — Ambulatory Visit: Payer: PPO | Admitting: Oncology

## 2024-08-21 ENCOUNTER — Encounter: Payer: Self-pay | Admitting: Nurse Practitioner

## 2024-08-21 ENCOUNTER — Ambulatory Visit (INDEPENDENT_AMBULATORY_CARE_PROVIDER_SITE_OTHER): Admitting: Nurse Practitioner

## 2024-08-21 ENCOUNTER — Ambulatory Visit: Admitting: Nurse Practitioner

## 2024-08-21 VITALS — BP 138/78 | HR 72 | Temp 97.7°F | Ht 64.0 in | Wt 143.0 lb

## 2024-08-21 DIAGNOSIS — G4733 Obstructive sleep apnea (adult) (pediatric): Secondary | ICD-10-CM | POA: Diagnosis not present

## 2024-08-21 DIAGNOSIS — F5101 Primary insomnia: Secondary | ICD-10-CM

## 2024-08-21 MED ORDER — BELSOMRA 5 MG PO TABS: 5.0000 mg | ORAL_TABLET | Freq: Every day | ORAL | 0 refills | Status: DC

## 2024-08-21 NOTE — Assessment & Plan Note (Signed)
 Severe OSA on CPAP. Suboptimal compliance related to sinusitis and insomnia. Aware of risks of untreated OSA. Encouraged to work on increasing usage with goal of 6/h minimum a night. Sleep hygiene and change insomnia management reviewed. Understands proper care/use of device. Safe driving practices reviewed.   Patient Instructions  Continue to use CPAP every night, minimum of 4-6 hours a night.  Change equipment as directed. Wash your tubing with warm soap and water  daily, hang to dry. Wash humidifier portion weekly. Use bottled, distilled water  and change daily Be aware of reduced alertness and do not drive or operate heavy machinery if experiencing this or drowsiness.  Exercise encouraged, as tolerated. Healthy weight management discussed.  Avoid or decrease alcohol consumption and medications that make you more sleepy, if possible. Notify if persistent daytime sleepiness occurs even with consistent use of PAP therapy.   Stop lunesta . Start Belsomra 5 mg At bedtime At bedtime as needed for sleep. Take immediately before bed with plan to be in the bed for 7-8 hours. If this isn't helping you stay asleep during the night after a few nights, you can go up to 2 tablets (which is 10 mg). Apply your CPAP within 5-10 minutes of taking to avoid falling asleep without it as it could cause worsening sleep apnea. Monitor and notify if you develop any changes in sleep habits, such as sleep walking, or mood changes. Do not drive or operate heavy machinery after taking. Do not take additional sedative medications or alcohol.    Follow up in 6 weeks with Ashley Jacari Iannello,NP in [place] or video visit. If symptoms do not improve or worsen, please contact office for sooner follow up or seek emergency care.

## 2024-08-21 NOTE — Assessment & Plan Note (Signed)
 Ongoing insomniac symptoms despite lunesta . Diphenhydramine /OTC sleep aids not recommended long term. Will d/c and trial change to Belsomra. Side effect/safety profile reviewed. Sleep hygiene reviewed. Close follow up to assess efficacy.
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
## 2024-08-21 NOTE — Patient Instructions (Addendum)
 Continue to use CPAP every night, minimum of 4-6 hours a night.  Change equipment as directed. Wash your tubing with warm soap and water  daily, hang to dry. Wash humidifier portion weekly. Use bottled, distilled water  and change daily Be aware of reduced alertness and do not drive or operate heavy machinery if experiencing this or drowsiness.  Exercise encouraged, as tolerated. Healthy weight management discussed.  Avoid or decrease alcohol consumption and medications that make you more sleepy, if possible. Notify if persistent daytime sleepiness occurs even with consistent use of PAP therapy.   Stop lunesta . Start Belsomra 5 mg At bedtime At bedtime as needed for sleep. Take immediately before bed with plan to be in the bed for 7-8 hours. If this isn't helping you stay asleep during the night after a few nights, you can go up to 2 tablets (which is 10 mg). Apply your CPAP within 5-10 minutes of taking to avoid falling asleep without it as it could cause worsening sleep apnea. Monitor and notify if you develop any changes in sleep habits, such as sleep walking, or mood changes. Do not drive or operate heavy machinery after taking. Do not take additional sedative medications or alcohol.    Follow up in 6 weeks with Katie Janaiah Vetrano,NP in [place] or video visit. If symptoms do not improve or worsen, please contact office for sooner follow up or seek emergency care.

## 2024-08-25 ENCOUNTER — Encounter: Payer: Self-pay | Admitting: Emergency Medicine

## 2024-08-25 ENCOUNTER — Telehealth: Payer: Self-pay

## 2024-08-25 ENCOUNTER — Ambulatory Visit
Admission: EM | Admit: 2024-08-25 | Discharge: 2024-08-25 | Disposition: A | Discharge status: Home / Self Care | Attending: Emergency Medicine | Admitting: Emergency Medicine

## 2024-08-25 DIAGNOSIS — J069 Acute upper respiratory infection, unspecified: Secondary | ICD-10-CM

## 2024-08-25 LAB — POC COVID19/FLU A&B COMBO
Covid Antigen, POC: NEGATIVE
Influenza A Antigen, POC: NEGATIVE
Influenza B Antigen, POC: NEGATIVE

## 2024-08-25 MED ORDER — BENZONATATE 100 MG PO CAPS: 100.0000 mg | ORAL_CAPSULE | Freq: Three times a day (TID) | ORAL | 0 refills | Status: DC

## 2024-08-25 MED ORDER — PROMETHAZINE-DM 6.25-15 MG/5ML PO SYRP: 5.0000 mL | ORAL_SOLUTION | Freq: Every evening | ORAL | 0 refills | Status: DC | PRN

## 2024-08-25 MED ORDER — PREDNISONE 10 MG (21) PO TBPK: ORAL_TABLET | Freq: Every day | ORAL | 0 refills | Status: DC

## 2024-08-25 MED ORDER — IPRATROPIUM BROMIDE 0.03 % NA SOLN: 2.0000 | Freq: Two times a day (BID) | NASAL | 0 refills | Status: AC

## 2024-08-25 MED ORDER — AMOXICILLIN-POT CLAVULANATE 875-125 MG PO TABS: 1.0000 | ORAL_TABLET | Freq: Two times a day (BID) | ORAL | 0 refills | Status: DC

## 2024-08-25 NOTE — Telephone Encounter (Signed)
 Copied from CRM [id number]. Topic: Clinical - Medical Advice >> Aug 25, 2024  8:51 AM Isabell A wrote: Reason for CRM: Patient states Suvorexant (BELSOMRA) 5 MG TABS [[id number]] does not make her drowsy or sleepy at all - it is not helping her at all, each day she is operating from 2-4 hours of sleep.   Callback number: [phone]

## 2024-08-25 NOTE — Telephone Encounter (Signed)
 Patient reports she took one tablet first night. Then 2 tablets. And the past 2 nights she has taken 3 tablets. Patient is requesting new medication.

## 2024-08-25 NOTE — ED Triage Notes (Signed)
 Patient reports nasal congestion, dry cough, and sinus pressure x 3 days. Patient took mucinex  with no relief.  Rates pain 7/10.

## 2024-08-25 NOTE — Telephone Encounter (Signed)
 She's tried and failed multiple agents including lunesta , trazodone , and now Belsomra. She was able to get about 4-5 hours with Lunesta . Can resume this at 3 mg. Discontinue Belsomra. Do not take more than one sleep aid. Please set her up with an appt with Dr. Neda in [place] (new pt) to address persistent insomniac symptoms.

## 2024-08-25 NOTE — ED Provider Notes (Signed)
 [name]    CSN: [id number] Arrival date & time: 08/25/24  1406      History   Chief Complaint Chief Complaint  Patient presents with   Cough   Nasal Congestion   Facial Pain    HPI Ashley Gross is a 73 y.o. female.   Patient presents for evaluation of fever peaking at 99.2, nasal congestion, sinus pressure behind the right eye, intermittent headaches, scratchy throat, postnasal drip present for 2 days.  Began to experience a nonproductive cough today.  No known sick contact prior.  Tolerable to food and liquids but appetite is decreased.  Has attempted use of Mucinex .    Past Medical History:  Diagnosis Date   Abnormal liver function test 10/06/2012   Allergy 1986   Anemia 01/25/2022   Anemia, unspecified    Asthma 1989   B12 deficiency 02/11/2019   Bleeding internal hemorrhoids 09/13/2017   Cataract 2009   Choledocholithiasis 06/29/2012   Formatting of this note might be different from the original. Dilated CBD on abdominal imaging 04/2012   Chronic diarrhea 02/16/2013   Clotting disorder 2019   Colitis    Complication of anesthesia    asthma attack after shoulder surgery   COPD (chronic obstructive pulmonary disease) (HCC) 2016   Diabetes mellitus (HCC)    type II   Esophagitis    GERD (gastroesophageal reflux disease)    Heart murmur 1952   for years- nothing to be concerned about.   History of kidney stones    Hx of arteriovenous malformation (AVM)    Hypertension    IBS (irritable bowel syndrome)    Loss of weight    Neuromuscular disorder (HCC) 1988   Peripheral vascular disease    Pernicious anemia    Personal history of colonic polyps 02/10/2013   02/08/13 colonoscopy - question of rectal varices, two 3-14mm polyps in the sigmoid colon, mass at the hepatic flexure, one 5mm polyp at the hepatic flexure, nodule at the ileocecal valve, congested mucusa in the entire colon, flattened villi mucosa in the terminal ileum.     Pneumonia     Psoriatic arthritis (HCC)    s/p penicillamine, plaquenil, MTX, sulfasalazine.  s/p Embrel, Humira,.  Iritis.     Pure hypercholesterolemia    Rectal bleeding 07/15/2017   Sleep apnea 04/2023   Stroke (HCC) 2023   per patient    Patient Active Problem List   Diagnosis Date Noted   Diarrhea 02/06/2024   Hypokalemia 12/18/2023   Fever 12/03/2023   Sore throat 12/03/2023   Murmur 09/12/2023   Back pain 09/12/2023   Dizziness 07/19/2023   Allergic rhinitis 07/19/2023   Iron deficiency anemia 06/07/2023   Severe obstructive sleep apnea 05/10/2023   Insomnia 05/10/2023   Daytime somnolence 03/30/2023   Perianal excoriation 02/26/2023   Asthma exacerbation 02/20/2023   Sepsis (HCC) 02/18/2023   Cough 02/15/2023   Urinary frequency 12/28/2022   Aneurysm 11/28/2022   Type 2 diabetes mellitus with diabetic nephropathy, without long-term current use of insulin  (HCC) 11/28/2022   History of CVA (cerebrovascular accident) 05/30/2022   Anemia 01/25/2022   Left inguinal hernia    Meningioma (HCC) 11/05/2021   Inguinal hernia 10/31/2021   Pituitary lesion 10/31/2021   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   Ear fullness 05/25/2021   Skin lesion 03/15/2021   Rib pain on left side 08/05/2020   Sleep difficulties 06/08/2020   Swelling of lower extremity 11/26/2019  Healthcare maintenance 09/13/2019   Asthma 12/28/2018   Vitamin D  deficiency 11/01/2018   Tendinitis of upper biceps tendon of left shoulder 09/20/2018   Depression, major, single episode, mild 09/15/2018   Mild intermittent asthma without complication 09/15/2018   Type 2 diabetes mellitus with hyperglycemia (HCC) 09/15/2018   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   Traumatic incomplete tear of left rotator cuff 09/09/2018   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   Low back pain 03/29/2016   Acute sinusitis 12/19/2015   Stress 12/12/2014   Inflammatory polyps of colon  (HCC) 02/16/2013   History of colonic polyps 02/10/2013   Hypertension 10/06/2012   GERD (gastroesophageal reflux disease) 10/06/2012   Hypercholesterolemia 10/06/2012   Elevated alkaline phosphatase level 06/29/2012   Psoriatic arthritis (HCC) 06/29/2012    Past Surgical History:  Procedure Laterality Date   ABDOMINAL HYSTERECTOMY  1981   ovaries left in place   ANKLE SURGERY     right   BRAIN SURGERY  November 2023   BUNIONECTOMY     CHOLECYSTECTOMY  1989   COLONOSCOPY WITH PROPOFOL  N/A 12/27/2017   Procedure: COLONOSCOPY WITH PROPOFOL ;  Surgeon: Unk Corinn Skiff, MD;  Location: [facility] END[facility]COPY;  Service: Gastroenterology;  Laterality: N/A;   COLONOSCOPY WITH PROPOFOL  N/A 01/04/2019   Procedure: COLONOSCOPY WITH PROPOFOL ;  Surgeon: Unk Corinn Skiff, MD;  Location: [facility];  Service: Endoscopy;  Laterality: N/A;  Diabetic - oral and injectable   COLONOSCOPY WITH PROPOFOL  N/A 07/24/2021   Procedure: COLONOSCOPY WITH PROPOFOL ;  Surgeon: Unk Corinn Skiff, MD;  Location: [facility];  Service: Endoscopy;  Laterality: N/A;  Diabetic   ESOPHAGOGASTRODUODENOSCOPY (EGD) WITH PROPOFOL  N/A 12/27/2017   Procedure: ESOPHAGOGASTRODUODENOSCOPY (EGD) WITH PROPOFOL ;  Surgeon: Unk Corinn Skiff, MD;  Location: [facility] ENDOSCOPY;  Service: Gastroenterology;  Laterality: N/A;   EYE SURGERY  2011 2019   HAND SURGERY     right   HEEL SPUR SURGERY     HERNIA REPAIR  1977& 2023   IR 3D INDEPENDENT WKST  10/01/2021   IR ANGIO INTRA EXTRACRAN SEL COM CAROTID INNOMINATE UNI L MOD SED  10/01/2021   IR ANGIO INTRA EXTRACRAN SEL INTERNAL CAROTID UNI R MOD SED  10/01/2021   IR ANGIO INTRA EXTRACRAN SEL INTERNAL CAROTID UNI R MOD SED  05/29/2022   IR ANGIO VERTEBRAL SEL VERTEBRAL BILAT MOD SED  10/01/2021   IR ANGIO VERTEBRAL SEL VERTEBRAL UNI R MOD SED  05/29/2022   IR CT HEAD LTD  10/01/2021   IR TRANSCATH/EMBOLIZ  10/01/2021   IR US  GUIDE VASC ACCESS RIGHT  10/01/2021   IR US   GUIDE VASC ACCESS RIGHT  05/29/2022   NOSE SURGERY     x2   POLYPECTOMY  01/04/2019   Procedure: POLYPECTOMY;  Surgeon: Unk Corinn Skiff, MD;  Location: [facility];  Service: Endoscopy;;   POLYPECTOMY N/A 07/24/2021   Procedure: POLYPECTOMY;  Surgeon: Unk Corinn Skiff, MD;  Location: [facility];  Service: Endoscopy;  Laterality: N/A;   RADIOLOGY WITH ANESTHESIA N/A 10/01/2021   Procedure: HENDERSON WITH ANESTHESIA;  Surgeon: de Macedo Rodrigues, Katyucia, MD;  Location: [facility] OR;  Service: Radiology;  Laterality: N/A;   SHOULDER ARTHROSCOPY WITH OPEN ROTATOR CUFF REPAIR Left 09/20/2018   Procedure: SHOULDER ARTHROSCOPY WITH OPEN ROTATOR CUFF REPAIR;  Surgeon: Edie Norleen PARAS, MD;  Location: [facility] ORS;  Service: Orthopedics;  Laterality: Left;   SHOULDER CLOSED REDUCTION Left 12/21/2018   Procedure: MANIPULATION UNDER ANESTHESIA WITH STEROID INJECTION;  Surgeon: Edie Norleen PARAS, MD;  Location: [facility];  Service: Orthopedics;  Laterality: Left;  Diabetic - insulin  and oral meds   UMBILICAL HERNIA REPAIR     XI ROBOTIC ASSISTED INGUINAL HERNIA REPAIR WITH MESH Left 12/16/2021   Procedure: XI ROBOTIC ASSISTED INGUINAL HERNIA REPAIR WITH MESH;  Surgeon: Desiderio Schanz, MD;  Location: [facility] ORS;  Service: General;  Laterality: Left;    OB History   No obstetric history on file.      Home Medications    Prior to Admission medications   Medication Sig Start Date End Date Taking? Authorizing Provider  amoxicillin -clavulanate (AUGMENTIN ) 875-125 MG tablet Take 1 tablet by mouth every 12 (twelve) hours. 08/30/24  Yes Exavior Kimmons R, NP  benzonatate  (TESSALON ) 100 MG capsule Take 1 capsule (100 mg total) by mouth every 8 (eight) hours. 08/25/24  Yes Chenoa Luddy R, NP  ipratropium (ATROVENT) 0.03 % nasal spray Place 2 sprays into both nostrils every 12 (twelve) hours. 08/25/24  Yes Bernetta Sutley R, NP  predniSONE  (STERAPRED UNI-PAK 21 TAB) 10 MG (21) TBPK  tablet Take by mouth daily. Take 6 tabs by mouth daily  for 1 days, then 5 tabs for 1 days, then 4 tabs for 1 days, then 3 tabs for 1 days, 2 tabs for 1 days, then 1 tab by mouth daily for 1 days 08/25/24  Yes Lennis Rader R, NP  promethazine-dextromethorphan (PROMETHAZINE-DM) 6.25-15 MG/5ML syrup Take 5 mLs by mouth at bedtime as needed. 08/25/24  Yes Lataja Newland R, NP  acetaminophen  (TYLENOL ) 500 MG tablet Take 2 tablets (1,000 mg total) by mouth every 6 (six) hours as needed for mild pain. 12/16/21   Desiderio Schanz, MD  albuterol  (VENTOLIN  HFA) 108 (90 Base) MCG/ACT inhaler Inhale 2 puffs into the lungs every 6 (six) hours as needed for wheezing or shortness of breath.    [provider]  amLODipine  (NORVASC ) 5 MG tablet Take 1 tablet (5 mg total) by mouth daily. 08/11/24   Glendia Shad, MD  Azelastine  HCl 137 MCG/SPRAY SOLN PLACE 2 SPRAYS INTO BOTH NOSTRILS 2 (TWO) TIMES DAILY. USE IN EACH NOSTRIL AS DIRECTED 11/22/23   Maribeth Camellia MATSU, MD  cetirizine (ZYRTEC) 10 MG tablet Take 10 mg by mouth daily.    [provider]  Cholecalciferol  (VITAMIN D ) 50 MCG (2000 UT) tablet Take 2,000 Units by mouth daily.    [provider]  CREON  36000-114000 units CPEP capsule Take 1-2 capsules (36,000-72,000 Units total) by mouth See admin instructions. Take 720000 units with each meal and 36000 units with snacks 07/15/22   Vanga, Rohini Reddy, MD  fluticasone -salmeterol (WIXELA INHUB) 100-50 MCG/ACT AEPB INHALE 1 PUFF INTO THE LUNGS TWICE A DAY 08/11/24   Glendia Shad, MD  hydrALAZINE (APRESOLINE) 10 MG tablet Take 1 tablet (10 mg total) by mouth 3 (three) times daily. 08/11/24   Glendia Shad, MD  insulin  degludec (TRESIBA  FLEXTOUCH) 100 UNIT/ML FlexTouch Pen Inject 16 Units into the skin daily. 04/05/23   Glendia Shad, MD  loperamide  (IMODIUM ) 2 MG capsule Take 4 mg by mouth daily.    [provider]  losartan  (COZAAR ) 100 MG tablet TAKE 1 TABLET(100 MG) BY MOUTH  AT BEDTIME 01/03/24   Glendia Shad, MD  mometasone  (ELOCON ) 0.1 % ointment Apply 1 Application topically 2 (two) times daily. 03/16/23   [provider]  omeprazole  (PRILOSEC) 20 MG capsule TAKE 1 CAPSULE(20 MG) BY MOUTH TWICE DAILY 08/11/24   Glendia Shad, MD  ONE TOUCH ULTRA TEST test strip PATIENT NEEDS NEW  METER STRIPS AND LANCETS FOR ONE TOUCH. HER INSURANCE NO LONGER COVERS ACCUCHEK. 07/31/15   Glendia Shad, MD  potassium chloride  (KLOR-CON  M) 10 MEQ tablet TAKE 1 TABLET BY MOUTH EVERY DAY 03/14/24   Glendia Shad, MD  rosuvastatin  (CRESTOR ) 10 MG tablet Take 1 tablet (10 mg total) by mouth daily. 04/11/24   Glendia Shad, MD  Suvorexant (BELSOMRA) 5 MG TABS Take 1 tablet (5 mg total) by mouth at bedtime. 08/21/24   Cobb, Comer GAILS, NP  vedolizumab  (ENTYVIO ) 300 MG injection Inject 300 mg into the vein 4 (four) times a week. 12/23/23   Unk Corinn Skiff, MD    Family History Family History  Problem Relation Age of Onset   Diabetes Mother    Hypertension Mother    Arthritis Father    Diabetes Father    Heart disease Father    Hypertension Father    Diabetes Other    Hypertension Other    ADD / ADHD Daughter    Asthma Daughter    ADD / ADHD Daughter    Asthma Daughter    Breast cancer Neg Hx    Colon cancer Neg Hx     Social History Social History   Tobacco Use   Smoking status: Never    Passive exposure: Never   Smokeless tobacco: Never  Vaping Use   Vaping status: Never Used  Substance Use Topics   Alcohol use: Never   Drug use: Never     Allergies   Aspirin , Beta adrenergic blockers, Cobalamin combinations, Lisinopril , Mtx support [cobalamine combinations], Nsaids, Vasotec [enalapril], Verapamil , Voltaren [diclofenac sodium], Erythromycin , Indocin [indomethacin], and Jardiance  [empagliflozin ]   Review of Systems Review of Systems   Physical Exam Triage Vital Signs ED Triage Vitals  Encounter Vitals Group     BP 08/25/24 1452 (!) 165/79      Girls Systolic BP Percentile --      Girls Diastolic BP Percentile --      Boys Systolic BP Percentile --      Boys Diastolic BP Percentile --      Pulse Rate 08/25/24 1452 64     Resp 08/25/24 1452 17     Temp 08/25/24 1452 98 F (36.7 C)     Temp Source 08/25/24 1452 Oral     SpO2 08/25/24 1452 96 %     Weight --      Height --      Head Circumference --      Peak Flow --      Pain Score 08/25/24 1509 7     Pain Loc --      Pain Education --      Exclude from Growth Chart --    No data found.  Updated Vital Signs BP (!) 165/79 (BP Location: Left Arm) Comment: Pt didn't take BP med this morning.  Pulse 64   Temp 98 F (36.7 C) (Oral)   Resp 17   SpO2 96%   Visual Acuity Right Eye Distance:   Left Eye Distance:   Bilateral Distance:    Right Eye Near:   Left Eye Near:    Bilateral Near:     Physical Exam Constitutional:      Appearance: Normal appearance.  HENT:     Head: Normocephalic.     Right Ear: Tympanic membrane, ear canal and external ear normal.     Left Ear: Tympanic membrane, ear canal and external ear normal.     Nose: Congestion present.  Mouth/Throat:     Mouth: Mucous membranes are moist.     Pharynx: Oropharynx is clear. Posterior oropharyngeal erythema (mild) present.  Eyes:     Extraocular Movements: Extraocular movements intact.  Cardiovascular:     Rate and Rhythm: Normal rate and regular rhythm.     Pulses: Normal pulses.     Heart sounds: Normal heart sounds.  Pulmonary:     Effort: Pulmonary effort is normal.     Breath sounds: Normal breath sounds.  Musculoskeletal:     Cervical back: Normal range of motion and neck supple.  Neurological:     Mental Status: She is alert and oriented to person, place, and time. Mental status is at baseline.      UC Treatments / Results  Labs (all labs ordered are listed, but only abnormal results are displayed) Labs Reviewed  POC COVID19/FLU A&B COMBO - Normal     EKG   Radiology No results found.  Procedures Procedures (including critical care time)  Medications Ordered in UC Medications - No data to display  Initial Impression / Assessment and Plan / UC Course  I have reviewed the triage vital signs and the nursing notes.  Pertinent labs & imaging results that were available during my care of the patient were reviewed by me and considered in my medical decision making (see chart for details).  Viral URI with cough  Patient is in no signs of distress nor toxic appearing.  Vital signs are stable.  Low suspicion for pneumonia, pneumothorax or bronchitis and therefore will defer imaging.  Prescribed prednisone , Tessalon , Promethazine DM and Atrovent nasal spray, watch and wait antibiotic placed at pharmacy. May use additional over-the-counter medications as needed for supportive care.  May follow-up with urgent care as needed if symptoms persist or worsen.   Final Clinical Impressions(s) / UC Diagnoses   Final diagnoses:  Viral URI with cough     Discharge Instructions      Your symptoms today are most likely being caused by a virus and should steadily improve in time it can take up to 7 to 10 days before you truly start to see a turnaround however things will get better, if no improvement in your symptoms by Wednesday may pick up antibiotics from the pharmacy  COVID and flu test negative  May use Tessalon  pill every 8 hours as needed for cough May use cough syrup at bedtime to allow for rest  Begin prednisone  every morning with food as directed to help reduce sinus pressure, avoid ibuprofen while taking the may use Tylenol     You can take Tylenol   as needed for fever reduction and pain relief.   For cough: honey 1/2 to 1 teaspoon (you can dilute the honey in water  or another fluid).  You can also use guaifenesin  and dextromethorphan for cough. You can use a humidifier for chest congestion and cough.  If you don't have a  humidifier, you can sit in the bathroom with the hot shower running.      For sore throat: try warm salt water  gargles, cepacol lozenges, throat spray, warm tea or water  with lemon/honey, popsicles or ice, or OTC cold relief medicine for throat discomfort.   For congestion: take a daily anti-histamine like Zyrtec, Claritin, and a oral decongestant, such as pseudoephedrine.  You can also use Flonase  1-2 sprays in each nostril daily.   It is important to stay hydrated: drink plenty of fluids (water , gatorade/powerade/pedialyte, juices, or teas) to keep your throat moisturized  and help further relieve irritation/discomfort.    ED Prescriptions     Medication Sig Dispense Auth. Provider   predniSONE  (STERAPRED UNI-PAK 21 TAB) 10 MG (21) TBPK tablet Take by mouth daily. Take 6 tabs by mouth daily  for 1 days, then 5 tabs for 1 days, then 4 tabs for 1 days, then 3 tabs for 1 days, 2 tabs for 1 days, then 1 tab by mouth daily for 1 days 21 tablet Martez Weiand R, NP   benzonatate  (TESSALON ) 100 MG capsule Take 1 capsule (100 mg total) by mouth every 8 (eight) hours. 21 capsule Abhiram Criado R, NP   promethazine-dextromethorphan (PROMETHAZINE-DM) 6.25-15 MG/5ML syrup Take 5 mLs by mouth at bedtime as needed. 118 mL Quin Mcpherson R, NP   ipratropium (ATROVENT) 0.03 % nasal spray Place 2 sprays into both nostrils every 12 (twelve) hours. 30 mL Kirston Luty R, NP   amoxicillin -clavulanate (AUGMENTIN ) 875-125 MG tablet Take 1 tablet by mouth every 12 (twelve) hours. 14 tablet Tykera Skates R, NP      PDMP not reviewed this encounter.   Teresa Shelba SAUNDERS, NP 08/25/24 [phone]

## 2024-08-25 NOTE — Telephone Encounter (Signed)
 She can try increasing to 10 mg and see if that helps. If not, will have to consider alternative agent. Thanks

## 2024-08-25 NOTE — Discharge Instructions (Signed)
 Your symptoms today are most likely being caused by a virus and should steadily improve in time it can take up to 7 to 10 days before you truly start to see a turnaround however things will get better, if no improvement in your symptoms by Wednesday may pick up antibiotics from the pharmacy  COVID and flu test negative  May use Tessalon  pill every 8 hours as needed for cough May use cough syrup at bedtime to allow for rest  Begin prednisone  every morning with food as directed to help reduce sinus pressure, avoid ibuprofen while taking the may use Tylenol     You can take Tylenol   as needed for fever reduction and pain relief.   For cough: honey 1/2 to 1 teaspoon (you can dilute the honey in water  or another fluid).  You can also use guaifenesin  and dextromethorphan for cough. You can use a humidifier for chest congestion and cough.  If you don't have a humidifier, you can sit in the bathroom with the hot shower running.      For sore throat: try warm salt water  gargles, cepacol lozenges, throat spray, warm tea or water  with lemon/honey, popsicles or ice, or OTC cold relief medicine for throat discomfort.   For congestion: take a daily anti-histamine like Zyrtec, Claritin, and a oral decongestant, such as pseudoephedrine.  You can also use Flonase  1-2 sprays in each nostril daily.   It is important to stay hydrated: drink plenty of fluids (water , gatorade/powerade/pedialyte, juices, or teas) to keep your throat moisturized and help further relieve irritation/discomfort.

## 2024-08-25 NOTE — Telephone Encounter (Signed)
 Patient advised, and agreed to seeing DR. Olalere. NFN.

## 2024-08-25 NOTE — Progress Notes (Signed)
 Ashley Gross                                          MRN: [id number]   08/25/2024   The [facility] Quality Team Specialist reviewed this patient medical record for the purposes of chart review for care gap closure. The following were reviewed: abstraction for care gap closure-controlling blood pressure.    [facility] Quality Team

## 2024-08-29 ENCOUNTER — Encounter: Payer: Self-pay | Admitting: Pulmonary Disease

## 2024-08-29 ENCOUNTER — Ambulatory Visit (INDEPENDENT_AMBULATORY_CARE_PROVIDER_SITE_OTHER): Admitting: Pulmonary Disease

## 2024-08-29 VITALS — BP 134/80 | HR 74 | Ht 64.0 in | Wt 147.8 lb

## 2024-08-29 DIAGNOSIS — G4700 Insomnia, unspecified: Secondary | ICD-10-CM | POA: Diagnosis not present

## 2024-08-29 DIAGNOSIS — G4733 Obstructive sleep apnea (adult) (pediatric): Secondary | ICD-10-CM | POA: Diagnosis not present

## 2024-08-29 DIAGNOSIS — F5101 Primary insomnia: Secondary | ICD-10-CM

## 2024-08-29 MED ORDER — ZOLPIDEM TARTRATE ER 6.25 MG PO TBCR
6.2500 mg | EXTENDED_RELEASE_TABLET | Freq: Every evening | ORAL | 0 refills | Status: DC | PRN
Start: 2024-08-29 — End: 2024-09-14

## 2024-08-29 NOTE — Patient Instructions (Signed)
 I have put in a prescription for Ambien at 6.25 Lets give it about 1 to 2 weeks to see whether it works for you, if it is not working we can go to a higher dose at 12.5 of the extended release  If Ambien does not work we can try [employer], in the same class as Belsomra-these medications do not have generic versions  Doxepin  at 3 to 6 mg is also approved for sleep onset and sleep maintenance insomnia  Make sure you do some research on cognitive behavioral therapy -This is the first-line treatment for insomnia -Not associated with any side effects -Improvement achieved with behavioral therapy are more long-lasting  Follow-up in 6 to 8 weeks

## 2024-08-29 NOTE — Progress Notes (Signed)
 Ashley Gross    [id number]    12-10-1950  Primary Care Physician:Scott, Allena, MD  Referring Physician: Glendia Allena, MD [address] [place],  [place] [address]  Chief complaint:    Patient being seen for insomnia HPI:  She has chronic insomnia Symptoms worsened following losing her spouse  Has used multiple medications to try to address the insomnia  Recently was on Lunesta  which stopped working after a while Transition to Belsomra which according to her is not working  She has sometimes combined Lunesta  with Tylenol  PM to help her get some sleep  She did ask about trying Ambien and we agreed on trying Ambien at 6.25 to be increased to 12.5 if suboptimal benefits  Discussed about cognitive behavioral therapy as the mainstay of treatment for insomnia  Discussed about the risks with medications for insomnia  She does have obstructive sleep apnea and uses CPAP regularly, benefit from CPAP use  She does have a history of hypertension, brain aneurysm, intermittent asthma, GERD, type 2 diabetes, psoriatic arthritis  Outpatient Encounter Medications as of 08/29/2024  Medication Sig   acetaminophen  (TYLENOL ) 500 MG tablet Take 2 tablets (1,000 mg total) by mouth every 6 (six) hours as needed for mild pain.   albuterol  (VENTOLIN  HFA) 108 (90 Base) MCG/ACT inhaler Inhale 2 puffs into the lungs every 6 (six) hours as needed for wheezing or shortness of breath.   amLODipine  (NORVASC ) 5 MG tablet Take 1 tablet (5 mg total) by mouth daily.   [START ON 08/30/2024] amoxicillin -clavulanate (AUGMENTIN ) 875-125 MG tablet Take 1 tablet by mouth every 12 (twelve) hours.   Azelastine  HCl 137 MCG/SPRAY SOLN PLACE 2 SPRAYS INTO BOTH NOSTRILS 2 (TWO) TIMES DAILY. USE IN EACH NOSTRIL AS DIRECTED   benzonatate  (TESSALON ) 100 MG capsule Take 1 capsule (100 mg total) by mouth every 8 (eight) hours.   cetirizine (ZYRTEC) 10 MG tablet Take 10 mg by mouth daily.    Cholecalciferol  (VITAMIN D ) 50 MCG (2000 UT) tablet Take 2,000 Units by mouth daily.   CREON  36000-114000 units CPEP capsule Take 1-2 capsules (36,000-72,000 Units total) by mouth See admin instructions. Take 720000 units with each meal and 36000 units with snacks   fluticasone -salmeterol (WIXELA INHUB) 100-50 MCG/ACT AEPB INHALE 1 PUFF INTO THE LUNGS TWICE A DAY   hydrALAZINE (APRESOLINE) 10 MG tablet Take 1 tablet (10 mg total) by mouth 3 (three) times daily.   insulin  degludec (TRESIBA  FLEXTOUCH) 100 UNIT/ML FlexTouch Pen Inject 16 Units into the skin daily.   ipratropium (ATROVENT) 0.03 % nasal spray Place 2 sprays into both nostrils every 12 (twelve) hours.   loperamide  (IMODIUM ) 2 MG capsule Take 4 mg by mouth daily.   losartan  (COZAAR ) 100 MG tablet TAKE 1 TABLET(100 MG) BY MOUTH AT BEDTIME   mometasone  (ELOCON ) 0.1 % ointment Apply 1 Application topically 2 (two) times daily.   omeprazole  (PRILOSEC) 20 MG capsule TAKE 1 CAPSULE(20 MG) BY MOUTH TWICE DAILY   ONE TOUCH ULTRA TEST test strip PATIENT NEEDS NEW METER STRIPS AND LANCETS FOR ONE TOUCH. HER INSURANCE NO LONGER COVERS ACCUCHEK.   potassium chloride  (KLOR-CON  M) 10 MEQ tablet TAKE 1 TABLET BY MOUTH EVERY DAY   predniSONE  (STERAPRED UNI-PAK 21 TAB) 10 MG (21) TBPK tablet Take by mouth daily. Take 6 tabs by mouth daily  for 1 days, then 5 tabs for 1 days, then 4 tabs for 1 days, then 3 tabs for 1 days, 2 tabs  for 1 days, then 1 tab by mouth daily for 1 days   promethazine-dextromethorphan (PROMETHAZINE-DM) 6.25-15 MG/5ML syrup Take 5 mLs by mouth at bedtime as needed.   rosuvastatin  (CRESTOR ) 10 MG tablet Take 1 tablet (10 mg total) by mouth daily.   Suvorexant (BELSOMRA) 5 MG TABS Take 1 tablet (5 mg total) by mouth at bedtime.   vedolizumab  (ENTYVIO ) 300 MG injection Inject 300 mg into the vein 4 (four) times a week.   zolpidem (AMBIEN CR) 6.25 MG CR tablet Take 1 tablet (6.25 mg total) by mouth at bedtime as needed for sleep.    No facility-administered encounter medications on file as of 08/29/2024.    Allergies as of 08/29/2024 - Review Complete 08/29/2024  Allergen Reaction Noted   Aspirin  Other (See Comments) 10/06/2012   Beta adrenergic blockers  10/06/2012   Cobalamin combinations Other (See Comments) 10/06/2012   Lisinopril  Cough 12/21/2018   Mtx support [cobalamine combinations] Other (See Comments) 10/06/2012   Nsaids  02/18/2023   Vasotec [enalapril] Cough 10/06/2012   Verapamil  Other (See Comments) 10/06/2012   Voltaren [diclofenac sodium]  10/06/2012   Erythromycin  Other (See Comments) 10/06/2012   Indocin [indomethacin]  10/06/2012   Jardiance  [empagliflozin ] Other (See Comments) 02/26/2020    Past Medical History:  Diagnosis Date   Abnormal liver function test 10/06/2012   Allergy 1986   Anemia 01/25/2022   Anemia, unspecified    Asthma 1989   B12 deficiency 02/11/2019   Bleeding internal hemorrhoids 09/13/2017   Cataract 2009   Choledocholithiasis 06/29/2012   Formatting of this note might be different from the original. Dilated CBD on abdominal imaging 04/2012   Chronic diarrhea 02/16/2013   Clotting disorder 2019   Colitis    Complication of anesthesia    asthma attack after shoulder surgery   COPD (chronic obstructive pulmonary disease) (HCC) 2016   Diabetes mellitus (HCC)    type II   Esophagitis    GERD (gastroesophageal reflux disease)    Heart murmur 1952   for years- nothing to be concerned about.   History of kidney stones    Hx of arteriovenous malformation (AVM)    Hypertension    IBS (irritable bowel syndrome)    Loss of weight    Neuromuscular disorder (HCC) 1988   Peripheral vascular disease    Pernicious anemia    Personal history of colonic polyps 02/10/2013   02/08/13 colonoscopy - question of rectal varices, two 3-64mm polyps in the sigmoid colon, mass at the hepatic flexure, one 5mm polyp at the hepatic flexure, nodule at the ileocecal valve, congested  mucusa in the entire colon, flattened villi mucosa in the terminal ileum.     Pneumonia    Psoriatic arthritis (HCC)    s/p penicillamine, plaquenil, MTX, sulfasalazine.  s/p Embrel, Humira,.  Iritis.     Pure hypercholesterolemia    Rectal bleeding 07/15/2017   Sleep apnea 04/2023   Stroke [facility]) 2023   per patient    Past Surgical History:  Procedure Laterality Date   ABDOMINAL HYSTERECTOMY  1981   ovaries left in place   ANKLE SURGERY     right   BRAIN SURGERY  November 2023   BUNIONECTOMY     CHOLECYSTECTOMY  1989   COLONOSCOPY WITH PROPOFOL  N/A 12/27/2017   Procedure: COLONOSCOPY WITH PROPOFOL ;  Surgeon: Unk Corinn Skiff, MD;  Location: [facility] ENDOSCOPY;  Service: Gastroenterology;  Laterality: N/A;   COLONOSCOPY WITH PROPOFOL  N/A 01/04/2019   Procedure: COLONOSCOPY WITH PROPOFOL ;  Surgeon:  Unk Corinn Skiff, MD;  Location: [facility];  Service: Endoscopy;  Laterality: N/A;  Diabetic - oral and injectable   COLONOSCOPY WITH PROPOFOL  N/A 07/24/2021   Procedure: COLONOSCOPY WITH PROPOFOL ;  Surgeon: Unk Corinn Skiff, MD;  Location: [facility] SUR[facility]ERY [facility];  Service: Endoscopy;  Laterality: N/A;  Diabetic   ESOPHAGOGASTRODUODENOSCOPY (EGD) WITH PROPOFOL  N/A 12/27/2017   Procedure: ESOPHAGOGASTRODUODENOSCOPY (EGD) WITH PROPOFOL ;  Surgeon: Unk Corinn Skiff, MD;  Location: [facility] ENDOSCOPY;  Service: Gastroenterology;  Laterality: N/A;   EYE SURGERY  2011 2019   HAND SURGERY     right   HEEL SPUR SURGERY     HERNIA REPAIR  1977& 2023   IR 3D INDEPENDENT WKST  10/01/2021   IR ANGIO INTRA EXTRACRAN SEL COM CAROTID INNOMINATE UNI L MOD SED  10/01/2021   IR ANGIO INTRA EXTRACRAN SEL INTERNAL CAROTID UNI R MOD SED  10/01/2021   IR ANGIO INTRA EXTRACRAN SEL INTERNAL CAROTID UNI R MOD SED  05/29/2022   IR ANGIO VERTEBRAL SEL VERTEBRAL BILAT MOD SED  10/01/2021   IR ANGIO VERTEBRAL SEL VERTEBRAL UNI R MOD SED  05/29/2022   IR CT HEAD LTD  10/01/2021   IR TRANSCATH/EMBOLIZ   10/01/2021   IR US  GUIDE VASC ACCESS RIGHT  10/01/2021   IR US  GUIDE VASC ACCESS RIGHT  05/29/2022   NOSE SURGERY     x2   POLYPECTOMY  01/04/2019   Procedure: POLYPECTOMY;  Surgeon: Unk Corinn Skiff, MD;  Location: [facility];  Service: Endoscopy;;   POLYPECTOMY N/A 07/24/2021   Procedure: POLYPECTOMY;  Surgeon: Unk Corinn Skiff, MD;  Location: [facility];  Service: Endoscopy;  Laterality: N/A;   RADIOLOGY WITH ANESTHESIA N/A 10/01/2021   Procedure: HENDERSON WITH ANESTHESIA;  Surgeon: de Macedo Rodrigues, Katyucia, MD;  Location: [facility] OR;  Service: Radiology;  Laterality: N/A;   SHOULDER ARTHROSCOPY WITH OPEN ROTATOR CUFF REPAIR Left 09/20/2018   Procedure: SHOULDER ARTHROSCOPY WITH OPEN ROTATOR CUFF REPAIR;  Surgeon: Edie Norleen PARAS, MD;  Location: [facility] ORS;  Service: Orthopedics;  Laterality: Left;   SHOULDER CLOSED REDUCTION Left 12/21/2018   Procedure: MANIPULATION UNDER ANESTHESIA WITH STEROID INJECTION;  Surgeon: Edie Norleen PARAS, MD;  Location: [facility];  Service: Orthopedics;  Laterality: Left;  Diabetic - insulin  and oral meds   UMBILICAL HERNIA REPAIR     XI ROBOTIC ASSISTED INGUINAL HERNIA REPAIR WITH MESH Left 12/16/2021   Procedure: XI ROBOTIC ASSISTED INGUINAL HERNIA REPAIR WITH MESH;  Surgeon: Desiderio Schanz, MD;  Location: [facility] ORS;  Service: General;  Laterality: Left;    Family History  Problem Relation Age of Onset   Diabetes Mother    Hypertension Mother    Arthritis Father    Diabetes Father    Heart disease Father    Hypertension Father    Diabetes Other    Hypertension Other    ADD / ADHD Daughter    Asthma Daughter    ADD / ADHD Daughter    Asthma Daughter    Breast cancer Neg Hx    Colon cancer Neg Hx     Social History   Socioeconomic History   Marital status: Widowed    Spouse name: Not on file   Number of children: 2   Years of education: Not on file   Highest education level: Associate degree: occupational,  scientist, product/process development, or vocational program  Occupational History   Not on file  Tobacco Use   Smoking status: Never    Passive exposure: Never  Smokeless tobacco: Never  Vaping Use   Vaping status: Never Used  Substance and Sexual Activity   Alcohol use: Never   Drug use: Never   Sexual activity: Not Currently  Other Topics Concern   Not on file  Social History Narrative   She is married and has two children   Grand daughter lives with her.   Social Drivers of Health   Financial Resource Strain: Medium Risk (04/24/2024)   Overall Financial Resource Strain (CARDIA)    Difficulty of Paying Living Expenses: Somewhat hard  Food Insecurity: Food Insecurity Present (04/24/2024)   Hunger Vital Sign    Worried About Running Out of Food in the Last Year: Sometimes true    Ran Out of Food in the Last Year: Never true  Transportation Needs: No Transportation Needs (04/24/2024)   PRAPARE - Administrator, Civil Service (Medical): No    Lack of Transportation (Non-Medical): No  Physical Activity: Insufficiently Active (04/24/2024)   Exercise Vital Sign    Days of Exercise per Week: 2 days    Minutes of Exercise per Session: 20 min  Stress: Stress Concern Present (04/24/2024)   [employer] of Occupational Health - Occupational Stress Questionnaire    Feeling of Stress: To some extent  Social Connections: Moderately Integrated (04/24/2024)   Social Connection and Isolation Panel    Frequency of Communication with Friends and Family: More than three times a week    Frequency of Social Gatherings with Friends and Family: More than three times a week    Attends Religious Services: More than 4 times per year    Active Member of [employer] or Organizations: Yes    Attends Banker Meetings: More than 4 times per year    Marital Status: Widowed  Intimate Partner Violence: Not At Risk (04/28/2024)   Humiliation, Afraid, Rape, and Kick questionnaire    Fear of Current or Ex-Partner:  No    Emotionally Abused: No    Physically Abused: No    Sexually Abused: No    Review of Systems  Respiratory:  Positive for apnea.   Psychiatric/Behavioral:  Positive for sleep disturbance.     Vitals:   08/29/24 1040  BP: 134/80  Pulse: 74  SpO2: 97%     Physical Exam Constitutional:      Appearance: Normal appearance.  HENT:     Head: Normocephalic.     Mouth/Throat:     Mouth: Mucous membranes are moist.  Eyes:     General: No scleral icterus. Cardiovascular:     Rate and Rhythm: Normal rate and regular rhythm.     Heart sounds: No murmur heard.    No friction rub.  Pulmonary:     Effort: No respiratory distress.     Breath sounds: No stridor. No wheezing or rhonchi.  Musculoskeletal:     Cervical back: No rigidity or tenderness.  Neurological:     Mental Status: She is alert.  Psychiatric:        Mood and Affect: Mood normal.    Data Reviewed: Previous sleep study noted  CPAP compliance noted  Assessment/Plan: Patient with severe insomnia Has tried multiple medications and continues to have difficulty with sleep onset and sleep maintenance  We agreed on trying Ambien CR, will start at 6.25 and if suboptimal effects, will increase to 12.5 noting side effect profile of Ambien  She will discontinue order for sleep aids  Obstructive sleep apnea - Encouraged to continue using CPAP  Informational material regarding cognitive behavioral therapy was provided to patient, list of locally available behavioral therapist for CBT-I provided  I encouraged her to do more research on CBT-I  Tentative follow-up in about 3 months  I encouraged her to give us  a call in a couple of weeks if the low-dose of Ambien is not working  If Ambien does not work overall, trial with Quviviq or doxepin  will be entertained    Jennet Epley MD [facility] 08/29/2024, 7:44 PM  CC: Glendia Shad, MD

## 2024-09-11 ENCOUNTER — Telehealth: Payer: Self-pay | Admitting: *Deleted

## 2024-09-11 NOTE — Telephone Encounter (Signed)
 Copied from CRM #[id number]. Topic: Clinical - Prescription Issue >> Sep 11, 2024  9:22 AM Ashley Gross wrote: Reason for CRM: Patient states Ambien 6.25mg  is not working - she is requesting 12.5mg  extended release.   Callback number: [phone]  I called and spoke with the pt  Ambien 6.25 mg helps some  She is able to sleep forf 2-3 hours She would like to try the 12.5 mg extended release that Dr Neda mentioned at last ov  Please send to [facility] [address] [place] Thanks!

## 2024-09-12 DIAGNOSIS — K5140 Inflammatory polyps of colon without complications: Secondary | ICD-10-CM | POA: Diagnosis not present

## 2024-09-13 ENCOUNTER — Telehealth: Payer: Self-pay | Admitting: Pulmonary Disease

## 2024-09-13 NOTE — Telephone Encounter (Unsigned)
 Copied from CRM #[id number]. Topic: Clinical - Medication Refill >> Sep 13, 2024 10:48 AM Rozanna MATSU wrote: Medication: zolpidem (AMBIEN CR) 6.25 MG CR tablet  Has the patient contacted their pharmacy? No (Agent: If no, request that the patient contact the pharmacy for the refill. If patient does not wish to contact the pharmacy document the reason why and proceed with request.) (Agent: If yes, when and what did the pharmacy advise?)  This is the patient's preferred pharmacy:  [id number] [place], [place] - [address] [place] [address] Phone: [phone] Fax: [phone]  Is this the correct pharmacy for this prescription? Yes If no, delete pharmacy and type the correct one.   Has the prescription been filled recently? Yes  Is the patient out of the medication? Yes  Has the patient been seen for an appointment in the last year OR does the patient have an upcoming appointment? Yes  Can we respond through MyChart? No  Agent: Please be advised that Rx refills may take up to 3 business days. We ask that you follow-up with your pharmacy.

## 2024-09-13 NOTE — Telephone Encounter (Signed)
 Callback number: [phone] >> Sep 13, 2024 10:54 AM Rozanna MATSU wrote: Pt called back about the prescription advised her I would send message and sent med refill also  Dr MALVA, please see msg below and advise Upping to urgent as she is calling back again  Thanks

## 2024-09-14 ENCOUNTER — Other Ambulatory Visit: Payer: Self-pay | Admitting: Pulmonary Disease

## 2024-09-14 MED ORDER — ZOLPIDEM TARTRATE ER 12.5 MG PO TBCR: 12.5000 mg | EXTENDED_RELEASE_TABLET | Freq: Every evening | ORAL | 1 refills | Status: AC | PRN

## 2024-09-14 NOTE — Progress Notes (Signed)
Prescription for Ambien 12.5 sent to pharmacy

## 2024-09-22 ENCOUNTER — Encounter: Payer: Self-pay | Admitting: Internal Medicine

## 2024-09-22 ENCOUNTER — Ambulatory Visit (INDEPENDENT_AMBULATORY_CARE_PROVIDER_SITE_OTHER): Admitting: Internal Medicine

## 2024-09-22 VITALS — BP 122/60 | HR 72 | Temp 97.6°F | Ht 64.0 in | Wt 141.4 lb

## 2024-09-22 DIAGNOSIS — J4520 Mild intermittent asthma, uncomplicated: Secondary | ICD-10-CM | POA: Diagnosis not present

## 2024-09-22 DIAGNOSIS — E7800 Pure hypercholesterolemia, unspecified: Secondary | ICD-10-CM | POA: Diagnosis not present

## 2024-09-22 DIAGNOSIS — Z8673 Personal history of transient ischemic attack (TIA), and cerebral infarction without residual deficits: Secondary | ICD-10-CM

## 2024-09-22 DIAGNOSIS — Z794 Long term (current) use of insulin: Secondary | ICD-10-CM

## 2024-09-22 DIAGNOSIS — G479 Sleep disorder, unspecified: Secondary | ICD-10-CM

## 2024-09-22 DIAGNOSIS — E1165 Type 2 diabetes mellitus with hyperglycemia: Secondary | ICD-10-CM

## 2024-09-22 DIAGNOSIS — I729 Aneurysm of unspecified site: Secondary | ICD-10-CM

## 2024-09-22 DIAGNOSIS — E237 Disorder of pituitary gland, unspecified: Secondary | ICD-10-CM | POA: Diagnosis not present

## 2024-09-22 DIAGNOSIS — D509 Iron deficiency anemia, unspecified: Secondary | ICD-10-CM

## 2024-09-22 DIAGNOSIS — F320 Major depressive disorder, single episode, mild: Secondary | ICD-10-CM

## 2024-09-22 DIAGNOSIS — L4050 Arthropathic psoriasis, unspecified: Secondary | ICD-10-CM

## 2024-09-22 DIAGNOSIS — R0981 Nasal congestion: Secondary | ICD-10-CM | POA: Diagnosis not present

## 2024-09-22 DIAGNOSIS — E559 Vitamin D deficiency, unspecified: Secondary | ICD-10-CM | POA: Diagnosis not present

## 2024-09-22 DIAGNOSIS — D329 Benign neoplasm of meninges, unspecified: Secondary | ICD-10-CM

## 2024-09-22 DIAGNOSIS — K51419 Inflammatory polyps of colon with unspecified complications: Secondary | ICD-10-CM

## 2024-09-22 DIAGNOSIS — F439 Reaction to severe stress, unspecified: Secondary | ICD-10-CM

## 2024-09-22 DIAGNOSIS — I10 Essential (primary) hypertension: Secondary | ICD-10-CM | POA: Diagnosis not present

## 2024-09-22 DIAGNOSIS — K219 Gastro-esophageal reflux disease without esophagitis: Secondary | ICD-10-CM

## 2024-09-22 MED ORDER — CEFDINIR 300 MG PO CAPS: 300.0000 mg | ORAL_CAPSULE | Freq: Two times a day (BID) | ORAL | 0 refills | Status: DC

## 2024-09-22 NOTE — Progress Notes (Unsigned)
 Subjective:    Patient ID: Ashley Gross, female    DOB: 01-28-1951, 73 y.o.   MRN: [id number]  Patient here for  Chief Complaint  Patient presents with   Medical Management of Chronic Issues    7 week f/u   URI    C/O stuffy/runny nose, HA, PND, facial pressure, slight cough, scratchy throat since Monday. Taking mucinex  and has helped some    HPI Here for a scheduled follow up - follow up regarding diabetes, IDA, hypertension and hypercholesterolemia. Evaluated 06/27/24 - acute visit - f/u elevated blood pressure. Amlodipine  was increased to 10mg  q day. Continues on losartan . 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 In 2023, she had a follow-up angiogram and unfortunately had some neurological deficits after that from which she has completely recovered. 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 The CT angiogram was recently done because the MR angiogram suggested recurrence however follow-up imaging confirmed that this was an artifact from the web device. The aneurysm is completely excluded. The meningioma and the pituitary adenoma are also unchanged. Recommended f/u MRA and MRI brain in 2 years. Had f/u with GI 09/12/24 - stable. No changes made. She reports that over the last couple of months, she had had persistent increased congestion and cough. Symptoms began in 07/2024. Treated with augmentin . Symptoms improved, but never completely resolved. Reevaluated 08/25/24 - treated with prednisone  and tessalon  perles. Reports that symptoms have been persistent. Reports that symptoms worsened again this past week. Reports increased nasal congestion, sinus pressure. Some headache - related to sinus. Increased cough - she relates to drainage. No chest congestion or chest tightness. No sob. Living in a new home. Discussed possible exposures or possible triggers. Given persistent symptoms despite  medications, discussed ENT referral.    Past Medical History:  Diagnosis Date   Abnormal liver function test 10/06/2012   Allergy 1986   Anemia 01/25/2022   Anemia, unspecified    Asthma 1989   B12 deficiency 02/11/2019   Bleeding internal hemorrhoids 09/13/2017   Cataract 2009   Choledocholithiasis 06/29/2012   Formatting of this note might be different from the original. Dilated CBD on abdominal imaging 04/2012   Chronic diarrhea 02/16/2013   Clotting disorder 2019   Colitis    Complication of anesthesia    asthma attack after shoulder surgery   COPD (chronic obstructive pulmonary disease) (HCC) 2016   Diabetes mellitus (HCC)    type II   Esophagitis    GERD (gastroesophageal reflux disease)    Heart murmur 1952   for years- nothing to be concerned about.   History of kidney stones    Hx of arteriovenous malformation (AVM)    Hypertension    IBS (irritable bowel syndrome)    Loss of weight    Neuromuscular disorder (HCC) 1988   Peripheral vascular disease    Pernicious anemia    Personal history of colonic polyps 02/10/2013   02/08/13 colonoscopy - question of rectal varices, two 3-30mm polyps in the sigmoid colon, mass at the hepatic flexure, one 5mm polyp at the hepatic flexure, nodule at the ileocecal valve, congested mucusa in the entire colon, flattened villi mucosa in the terminal ileum.     Pneumonia    Psoriatic arthritis (HCC)    s/p penicillamine, plaquenil, MTX, sulfasalazine.  s/p Embrel, Humira,.  Iritis.     Pure hypercholesterolemia    Rectal bleeding  07/15/2017   Sleep apnea 04/2023   Stroke [facility]) 2023   per patient   Past Surgical History:  Procedure Laterality Date   ABDOMINAL HYSTERECTOMY  1981   ovaries left in place   ANKLE SURGERY     right   BRAIN SURGERY  November 2023   BUNIONECTOMY     CHOLECYSTECTOMY  1989   COLONOSCOPY WITH PROPOFOL  N/A 12/27/2017   Procedure: COLONOSCOPY WITH PROPOFOL ;  Surgeon: Unk Corinn Skiff, MD;  Location: [facility]  ENDOSCOPY;  Service: Gastroenterology;  Laterality: N/A;   COLONOSCOPY WITH PROPOFOL  N/A 01/04/2019   Procedure: COLONOSCOPY WITH PROPOFOL ;  Surgeon: Unk Corinn Skiff, MD;  Location: [facility] SURGERY CNTR;  Service: Endoscopy;  Laterality: N/A;  Diabetic - oral and injectable   COLONOSCOPY WITH PROPOFOL  N/A 07/24/2021   Procedure: COLONOSCOPY WITH PROPOFOL ;  Surgeon: Unk Corinn Skiff, MD;  Location: [facility] SURGERY CNTR;  Service: Endoscopy;  Laterality: N/A;  Diabetic   ESOPHAGOGASTRODUODENOSCOPY (EGD) WITH PROPOFOL  N/A 12/27/2017   Procedure: ESOPHAGOGASTRODUODENOSCOPY (EGD) WITH PROPOFOL ;  Surgeon: Unk Corinn Skiff, MD;  Location: [facility] ENDOSCOPY;  Service: Gastroenterology;  Laterality: N/A;   EYE SURGERY  2011 2019   HAND SURGERY     right   HEEL SPUR SURGERY     HERNIA REPAIR  1977& 2023   IR 3D INDEPENDENT WKST  10/01/2021   IR ANGIO INTRA EXTRACRAN SEL COM CAROTID INNOMINATE UNI L MOD SED  10/01/2021   IR ANGIO INTRA EXTRACRAN SEL INTERNAL CAROTID UNI R MOD SED  10/01/2021   IR ANGIO INTRA EXTRACRAN SEL INTERNAL CAROTID UNI R MOD SED  05/29/2022   IR ANGIO VERTEBRAL SEL VERTEBRAL BILAT MOD SED  10/01/2021   IR ANGIO VERTEBRAL SEL VERTEBRAL UNI R MOD SED  05/29/2022   IR CT HEAD LTD  10/01/2021   IR TRANSCATH/EMBOLIZ  10/01/2021   IR US  GUIDE VASC ACCESS RIGHT  10/01/2021   IR US  GUIDE VASC ACCESS RIGHT  05/29/2022   NOSE SURGERY     x2   POLYPECTOMY  01/04/2019   Procedure: POLYPECTOMY;  Surgeon: Unk Corinn Skiff, MD;  Location: [facility] SURGERY [facility]TR;  Service: Endoscopy;;   POLYPECTOMY N/A 07/24/2021   Procedure: POLYPECTOMY;  Surgeon: Unk Corinn Skiff, MD;  Location: [facility] SURGERY CNTR;  Service: Endoscopy;  Laterality: N/A;   RADIOLOGY WITH ANESTHESIA N/A 10/01/2021   Procedure: HENDERSON WITH ANESTHESIA;  Surgeon: de Macedo Rodrigues, Katyucia, MD;  Location: [facility] OR;  Service: Radiology;  Laterality: N/A;   SHOULDER ARTHROSCOPY WITH OPEN ROTATOR CUFF REPAIR Left  09/20/2018   Procedure: SHOULDER ARTHROSCOPY WITH OPEN ROTATOR CUFF REPAIR;  Surgeon: Edie Norleen PARAS, MD;  Location: [facility] ORS;  Service: Orthopedics;  Laterality: Left;   SHOULDER CLOSED REDUCTION Left 12/21/2018   Procedure: MANIPULATION UNDER ANESTHESIA WITH STEROID INJECTION;  Surgeon: Edie Norleen PARAS, MD;  Location: [facility] SURGERY CNTR;  Service: Orthopedics;  Laterality: Left;  Diabetic - insulin  and oral meds   UMBILICAL HERNIA REPAIR     XI ROBOTIC ASSISTED INGUINAL HERNIA REPAIR WITH MESH Left 12/16/2021   Procedure: XI ROBOTIC ASSISTED INGUINAL HERNIA REPAIR WITH MESH;  Surgeon: Desiderio Schanz, MD;  Location: [facility] ORS;  Service: General;  Laterality: Left;   Family History  Problem Relation Age of Onset   Diabetes Mother    Hypertension Mother    Arthritis Father    Diabetes Father    Heart disease Father    Hypertension Father    Diabetes Other    Hypertension Other    ADD /  ADHD Daughter    Asthma Daughter    ADD / ADHD Daughter    Asthma Daughter    Breast cancer Neg Hx    Colon cancer Neg Hx    Social History   Socioeconomic History   Marital status: Widowed    Spouse name: Not on file   Number of children: 2   Years of education: Not on file   Highest education level: Associate degree: occupational, scientist, product/process development, or vocational program  Occupational History   Not on file  Tobacco Use   Smoking status: Never    Passive exposure: Never   Smokeless tobacco: Never  Vaping Use   Vaping status: Never Used  Substance and Sexual Activity   Alcohol use: Never   Drug use: Never   Sexual activity: Not Currently  Other Topics Concern   Not on file  Social History Narrative   She is married and has two children   Grand daughter lives with her.   Social Drivers of Corporate Investment Banker Strain: Low Risk  (09/12/2024)   Received [facility]   Overall Financial Resource Strain (CARDIA)    Difficulty of Paying Living Expenses: Not hard at all   Food Insecurity: No Food Insecurity (09/12/2024)   Received from [facility]   Hunger Vital Sign    Within the past 12 months, you worried that your food would run out before you got the money to buy more.: Never true    Within the past 12 months, the food you bought just didn't last and you didn't have money to get more.: Never true  Transportation Needs: No Transportation Needs (09/12/2024)   Received from [name] - Transportation    In the past 12 months, has lack of transportation kept you from medical appointments or from getting medications?: No    Lack of Transportation (Non-Medical): No  Physical Activity: Insufficiently Active (04/24/2024)   Exercise Vital Sign    Days of Exercise per Week: 2 days    Minutes of Exercise per Session: 20 min  Stress: Stress Concern Present (04/24/2024)   [employer] of Occupational Health - Occupational Stress Questionnaire    Feeling of Stress: To some extent  Social Connections: Moderately Integrated (04/24/2024)   Social Connection and Isolation Panel    Frequency of Communication with Friends and Family: More than three times a week    Frequency of Social Gatherings with Friends and Family: More than three times a week    Attends Religious Services: More than 4 times per year    Active Member of [employer] or Organizations: Yes    Attends Banker Meetings: More than 4 times per year    Marital Status: Widowed     Review of Systems  Constitutional:  Negative for fever and unexpected weight change.       Some decreased appetite.   HENT:  Positive for congestion, postnasal drip and sinus pressure.   Respiratory:  Positive for cough. Negative for chest tightness and shortness of breath.   Cardiovascular:  Negative for chest pain, palpitations and leg swelling.  Gastrointestinal:  Negative for vomiting.       GI issues overall improved. Still some flares, but improved.    Genitourinary:  Negative for difficulty urinating and dysuria.  Musculoskeletal:  Negative for joint swelling and myalgias.  Skin:  Negative for color change and rash.  Neurological:  Negative for dizziness and headaches.  Psychiatric/Behavioral:  Negative for agitation and dysphoric mood.        Objective:     BP 122/60   Pulse 72   Temp 97.6 F (36.4 C) (Oral)   Ht 5' 4 (1.626 m)   Wt 141 lb 6 oz (64.1 kg)   SpO2 97%   BMI 24.27 kg/m  Wt Readings from Last 3 Encounters:  09/22/24 141 lb 6 oz (64.1 kg)  08/29/24 147 lb 12.8 oz (67 kg)  08/21/24 143 lb (64.9 kg)    Physical Exam Vitals reviewed.  Constitutional:      General: She is not in acute distress.    Appearance: Normal appearance.  HENT:     Head: Normocephalic and atraumatic.     Right Ear: External ear normal.     Left Ear: External ear normal.     Mouth/Throat:     Pharynx: No oropharyngeal exudate or posterior oropharyngeal erythema.  Eyes:     General: No scleral icterus.       Right eye: No discharge.        Left eye: No discharge.     Conjunctiva/sclera: Conjunctivae normal.  Neck:     Thyroid : No thyromegaly.  Cardiovascular:     Rate and Rhythm: Normal rate and regular rhythm.  Pulmonary:     Effort: No respiratory distress.     Breath sounds: Normal breath sounds.     Comments: Increased air movement.  Abdominal:     General: Bowel sounds are normal.     Palpations: Abdomen is soft.     Tenderness: There is no abdominal tenderness.  Musculoskeletal:        General: No swelling or tenderness.     Cervical back: Neck supple. No tenderness.  Lymphadenopathy:     Cervical: No cervical adenopathy.  Skin:    Findings: No erythema or rash.  Neurological:     Mental Status: She is alert.  Psychiatric:        Mood and Affect: Mood normal.        Behavior: Behavior normal.         Outpatient Encounter Medications as of 09/22/2024  Medication Sig   acetaminophen  (TYLENOL ) 500 MG  tablet Take 2 tablets (1,000 mg total) by mouth every 6 (six) hours as needed for mild pain.   albuterol  (VENTOLIN  HFA) 108 (90 Base) MCG/ACT inhaler Inhale 2 puffs into the lungs every 6 (six) hours as needed for wheezing or shortness of breath.   amLODipine  (NORVASC ) 5 MG tablet Take 1 tablet (5 mg total) by mouth daily.   Azelastine  HCl 137 MCG/SPRAY SOLN PLACE 2 SPRAYS INTO BOTH NOSTRILS 2 (TWO) TIMES DAILY. USE IN EACH NOSTRIL AS DIRECTED   benzonatate  (TESSALON ) 100 MG capsule Take 1 capsule (100 mg total) by mouth every 8 (eight) hours.   cefdinir  (OMNICEF ) 300 MG capsule Take 1 capsule (300 mg total) by mouth 2 (two) times daily.   Cholecalciferol  (VITAMIN D ) 50 MCG (2000 UT) tablet Take 2,000 Units by mouth daily.   CREON  36000-114000 units CPEP capsule Take 1-2 capsules (36,000-72,000 Units total) by mouth See admin instructions. Take 720000 units with each meal and 36000 units with snacks   fluticasone -salmeterol (WIXELA INHUB) 100-50 MCG/ACT AEPB INHALE 1 PUFF INTO THE LUNGS TWICE A DAY   hydrALAZINE  (APRESOLINE ) 10 MG tablet Take 1 tablet (10 mg total) by mouth 3 (three) times daily.   insulin  degludec (TRESIBA  FLEXTOUCH) 100 UNIT/ML FlexTouch Pen Inject 16 Units into the skin  daily.   ipratropium (ATROVENT ) 0.03 % nasal spray Place 2 sprays into both nostrils every 12 (twelve) hours.   loperamide  (IMODIUM ) 2 MG capsule Take 4 mg by mouth daily.   losartan  (COZAAR ) 100 MG tablet TAKE 1 TABLET(100 MG) BY MOUTH AT BEDTIME   mometasone  (ELOCON ) 0.1 % ointment Apply 1 Application topically 2 (two) times daily.   omeprazole  (PRILOSEC) 20 MG capsule TAKE 1 CAPSULE(20 MG) BY MOUTH TWICE DAILY   ONE TOUCH ULTRA TEST test strip PATIENT NEEDS NEW METER STRIPS AND LANCETS FOR ONE TOUCH. HER INSURANCE NO LONGER COVERS ACCUCHEK.   potassium chloride  (KLOR-CON  M) 10 MEQ tablet TAKE 1 TABLET BY MOUTH EVERY DAY   promethazine -dextromethorphan (PROMETHAZINE -DM) 6.25-15 MG/5ML syrup Take 5 mLs by mouth  at bedtime as needed.   rosuvastatin  (CRESTOR ) 10 MG tablet Take 1 tablet (10 mg total) by mouth daily.   vedolizumab  (ENTYVIO ) 300 MG injection Inject 300 mg into the vein 4 (four) times a week.   zolpidem  (AMBIEN  CR) 12.5 MG CR tablet Take 1 tablet (12.5 mg total) by mouth at bedtime as needed for sleep.   [DISCONTINUED] amoxicillin -clavulanate (AUGMENTIN ) 875-125 MG tablet Take 1 tablet by mouth every 12 (twelve) hours. (Patient not taking: Reported on 09/22/2024)   [DISCONTINUED] cetirizine (ZYRTEC) 10 MG tablet Take 10 mg by mouth daily. (Patient not taking: Reported on 09/22/2024)   [DISCONTINUED] predniSONE  (STERAPRED UNI-PAK 21 TAB) 10 MG (21) TBPK tablet Take by mouth daily. Take 6 tabs by mouth daily  for 1 days, then 5 tabs for 1 days, then 4 tabs for 1 days, then 3 tabs for 1 days, 2 tabs for 1 days, then 1 tab by mouth daily for 1 days (Patient not taking: Reported on 09/22/2024)   [DISCONTINUED] Suvorexant  (BELSOMRA ) 5 MG TABS Take 1 tablet (5 mg total) by mouth at bedtime. (Patient not taking: Reported on 09/22/2024)   No facility-administered encounter medications on file as of 09/22/2024.     Lab Results  Component Value Date   WBC 6.4 05/04/2024   HGB 14.0 05/04/2024   HCT 43.8 05/04/2024   PLT 250 05/04/2024   GLUCOSE 70 08/09/2024   CHOL 132 08/09/2024   TRIG 72.0 08/09/2024   HDL 52.00 08/09/2024   LDLDIRECT 74.0 04/19/2019   LDLCALC 66 08/09/2024   ALT 12 08/09/2024   AST 19 08/09/2024   NA 144 08/09/2024   K 4.2 08/09/2024   CL 106 08/09/2024   CREATININE 1.01 08/09/2024   BUN 13 08/09/2024   CO2 31 08/09/2024   TSH 2.94 04/05/2024   INR 0.9 05/04/2024   HGBA1C 6.1 08/09/2024   MICROALBUR 9.0 (H) 08/09/2024       Assessment & Plan:  Vitamin D  deficiency Assessment & Plan: Plan to check vitamin D  level with next labs.    Type 2 diabetes mellitus with hyperglycemia, with long-term current use of insulin  [facility]) Assessment & Plan: Continues on tresiba .  Overall has been tolerating. Will need to follow symptoms. Follow met b and A1c.  Reviewed recent labs.  Lab Results  Component Value Date   HGBA1C 6.1 08/09/2024      Stress Assessment & Plan: Increased stress. Will notify me if she feels she needs further intervention. Follow.    Sleep difficulties Assessment & Plan: Seeing pulmonary. Continue cpap. They have started her on ambien . She feels this is helping. Follow.    Psoriatic arthritis (HCC) Assessment & Plan: Followed by rheumatology. Stable.    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 In 2023, she had a follow-up angiogram and unfortunately had some neurological deficits after that from which she has completely recovered. 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 The CT angiogram was recently done because the MR angiogram suggested recurrence however follow-up imaging confirmed that this was an artifact from the web device. The aneurysm is completely excluded. The meningioma and the pituitary adenoma are also unchanged. Recommended f/u MRA and MRI brain in 2 years.   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 In 2023, she had a follow-up angiogram and unfortunately had some neurological deficits after that from which she has completely recovered. 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 The CT angiogram was recently done because the MR angiogram suggested recurrence however follow-up imaging confirmed that this was an artifact from the web device. The aneurysm is completely excluded. The meningioma and the pituitary adenoma are also unchanged. Recommended f/u MRA and MRI brain in 2 years.   Pseudopolyposis of colon with complication, unspecified  part of colon [facility]) Assessment & Plan: S/p colonoscopy 07/24/21 as outlined.  Continues f/u with GI.  On entyvio  injections now. Overall improved. Continue f/u with GI.    Primary hypertension Assessment & Plan: Continues on losartan  and amlodipine  and hydralazine . Blood pressure as outlined. Continue current medication regimen. Follow pressures. Follow metabolic panel.    Hypercholesterolemia Assessment & Plan: Continue crestor . Follow lipid panel and liver function tests. No change today.  Lab Results  Component Value Date   CHOL 132 08/09/2024   HDL 52.00 08/09/2024   LDLCALC 66 08/09/2024   LDLDIRECT 74.0 04/19/2019   TRIG 72.0 08/09/2024   CHOLHDL 3 08/09/2024        Iron deficiency anemia, unspecified iron deficiency anemia type Assessment & Plan:  Seeing Dr Melanee for IDA. Last 12/21/23 - hgb and iron stable.  Continue f/u with hematology.    Aneurysm Assessment & Plan: Saw Dr Clois NSU - 01/2023 - At this point, she is asymptomatic. He recommended follow-up imaging. F/u MrI 05/2023 stable.  Recommended f/u MRI scan of the brain as well as MR angiogram of the brain in one year. Had f/u 04/2024 - recommended f/u with Dr Rosslyn. Had f/u 06/2024. Reviewed MRI - unchanged. Recommended f/u in 2 years.    Mild intermittent asthma without complication Assessment & Plan: Breathing stable. Treat current infection as outlined.    Depression, major, single episode, mild Assessment & Plan: Increased stress. Discussed. Will notify me if feels needs further intervention. Follow.    Gastroesophageal reflux disease, unspecified whether esophagitis present Assessment & Plan: Continues on prilosec.    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  MRA head and neck were negative.  ECHO - no evidence of embolic source. With history of significant GI  bleed in the past due to AVM, polyp bleed - not anticoagulated.    Sinus congestion Assessment & Plan: Increased sinus congestion as outlined. Increased nasal congestion, sinus pressure, drainage and cough - felt to be related to drainage. Persistent problem as outlined. Worsened recently. Treat with omnicef  as directed. Continue steroid nasal spray and astelin  nasal spray as directed. Continues mucinex .  Currently living in a different place now. Discussed possible exposures/triggers. Given persistent symptoms despite medication, discussed referral to ENT.   Orders: -     Ambulatory referral to ENT  Other orders -     Cefdinir ; Take 1 capsule (300 mg total) by mouth 2 (two) times daily.  Dispense: 14 capsule; Refill: 0     Allena Hamilton, MD

## 2024-09-24 ENCOUNTER — Encounter: Payer: Self-pay | Admitting: Internal Medicine

## 2024-09-24 NOTE — Assessment & Plan Note (Signed)
Increased stress.  Discussed.  Will notify me if feels needs further intervention.  Follow.

## 2024-09-24 NOTE — Assessment & Plan Note (Signed)
 Increased sinus congestion as outlined. Increased nasal congestion, sinus pressure, drainage and cough - felt to be related to drainage. Persistent problem as outlined. Worsened recently. Treat with omnicef  as directed. Continue steroid nasal spray and astelin  nasal spray as directed. Continues mucinex . Currently living in a different place now. Discussed possible exposures/triggers. Given persistent symptoms despite medication, discussed referral to ENT.

## 2024-09-24 NOTE — Assessment & Plan Note (Signed)
 Seeing pulmonary. Continue cpap. They have started her on ambien . She feels this is helping. Follow.

## 2024-09-24 NOTE — Assessment & Plan Note (Signed)
 Continues on tresiba . Overall has been tolerating. Will need to follow symptoms. Follow met b and A1c.  Reviewed recent labs.  Lab Results  Component Value Date   HGBA1C 6.1 08/09/2024

## 2024-09-24 NOTE — Assessment & Plan Note (Signed)
 Continue crestor . Follow lipid panel and liver function tests. No change today.  Lab Results  Component Value Date   CHOL 132 08/09/2024   HDL 52.00 08/09/2024   LDLCALC 66 08/09/2024   LDLDIRECT 74.0 04/19/2019   TRIG 72.0 08/09/2024   CHOLHDL 3 08/09/2024

## 2024-09-24 NOTE — Assessment & Plan Note (Signed)
 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  MRA head and neck were negative.  ECHO - no evidence of embolic source. With history of significant GI bleed in the past due to AVM, polyp bleed - not anticoagulated.

## 2024-09-24 NOTE — Assessment & Plan Note (Signed)
 Continues on losartan  and amlodipine  and hydralazine . Blood pressure as outlined. Continue current medication regimen. Follow pressures. Follow metabolic panel.

## 2024-09-24 NOTE — Assessment & Plan Note (Signed)
 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 In 2023, she had a follow-up angiogram and unfortunately had some neurological deficits after that from which she has completely recovered. 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 The CT angiogram was recently done because the MR angiogram suggested recurrence however follow-up imaging confirmed that this was an artifact from the web device. The aneurysm is completely excluded. The meningioma and the pituitary adenoma are also unchanged. Recommended f/u MRA and MRI brain in 2 years.

## 2024-09-24 NOTE — Assessment & Plan Note (Signed)
 Saw Dr Clois NSU - 01/2023 - At this point, she is asymptomatic. He recommended follow-up imaging. F/u MrI 05/2023 stable.  Recommended f/u MRI scan of the brain as well as MR angiogram of the brain in one year. Had f/u 04/2024 - recommended f/u with Dr Rosslyn. Had f/u 06/2024. Reviewed MRI - unchanged. Recommended f/u in 2 years.

## 2024-09-24 NOTE — Assessment & Plan Note (Signed)
 Seeing Dr Melanee for IDA. Last 12/21/23 - hgb and iron stable.  Continue f/u with hematology.

## 2024-09-24 NOTE — Assessment & Plan Note (Signed)
Continues on prilosec.  

## 2024-09-24 NOTE — Assessment & Plan Note (Signed)
 Breathing stable. Treat current infection as outlined.

## 2024-09-24 NOTE — Assessment & Plan Note (Signed)
 S/p colonoscopy 07/24/21 as outlined.  Continues f/u with GI.  On entyvio  injections now. Overall improved. Continue f/u with GI.

## 2024-09-24 NOTE — Assessment & Plan Note (Signed)
Followed by rheumatology.  Stable.  

## 2024-09-24 NOTE — Assessment & Plan Note (Signed)
 Plan to check vitamin D  level with next labs.

## 2024-09-24 NOTE — Assessment & Plan Note (Signed)
 Increased stress. Will notify me if she feels she needs further intervention. Follow.

## 2024-09-25 ENCOUNTER — Ambulatory Visit: Admitting: Nurse Practitioner

## 2024-09-25 DIAGNOSIS — E119 Type 2 diabetes mellitus without complications: Secondary | ICD-10-CM | POA: Diagnosis not present

## 2024-09-25 DIAGNOSIS — Z961 Presence of intraocular lens: Secondary | ICD-10-CM | POA: Diagnosis not present

## 2024-09-25 DIAGNOSIS — H43813 Vitreous degeneration, bilateral: Secondary | ICD-10-CM | POA: Diagnosis not present

## 2024-09-25 LAB — OPHTHALMOLOGY REPORT-SCANNED

## 2024-09-27 DIAGNOSIS — G4733 Obstructive sleep apnea (adult) (pediatric): Secondary | ICD-10-CM | POA: Diagnosis not present

## 2024-09-30 ENCOUNTER — Other Ambulatory Visit: Payer: Self-pay | Admitting: Family

## 2024-10-06 ENCOUNTER — Other Ambulatory Visit: Payer: Self-pay | Admitting: Internal Medicine

## 2024-10-06 ENCOUNTER — Emergency Department

## 2024-10-06 ENCOUNTER — Telehealth: Payer: Self-pay

## 2024-10-06 ENCOUNTER — Emergency Department
Admission: EM | Admit: 2024-10-06 | Discharge: 2024-10-07 | Disposition: A | Attending: Emergency Medicine | Admitting: Emergency Medicine

## 2024-10-06 DIAGNOSIS — I10 Essential (primary) hypertension: Secondary | ICD-10-CM | POA: Insufficient documentation

## 2024-10-06 DIAGNOSIS — J449 Chronic obstructive pulmonary disease, unspecified: Secondary | ICD-10-CM | POA: Diagnosis not present

## 2024-10-06 DIAGNOSIS — Z8673 Personal history of transient ischemic attack (TIA), and cerebral infarction without residual deficits: Secondary | ICD-10-CM | POA: Diagnosis not present

## 2024-10-06 DIAGNOSIS — Z79899 Other long term (current) drug therapy: Secondary | ICD-10-CM | POA: Diagnosis not present

## 2024-10-06 DIAGNOSIS — R079 Chest pain, unspecified: Secondary | ICD-10-CM | POA: Diagnosis not present

## 2024-10-06 DIAGNOSIS — E119 Type 2 diabetes mellitus without complications: Secondary | ICD-10-CM | POA: Insufficient documentation

## 2024-10-06 DIAGNOSIS — R0789 Other chest pain: Secondary | ICD-10-CM | POA: Diagnosis not present

## 2024-10-06 LAB — CBC WITH DIFFERENTIAL/PLATELET
Abs Immature Granulocytes: 0.01 K/uL (ref 0.00–0.07)
Basophils Absolute: 0.1 K/uL (ref 0.0–0.1)
Basophils Relative: 2 %
Eosinophils Absolute: 0.1 K/uL (ref 0.0–0.5)
Eosinophils Relative: 2 %
HCT: 38.5 % (ref 36.0–46.0)
Hemoglobin: 12.7 g/dL (ref 12.0–15.0)
Immature Granulocytes: 0 %
Lymphocytes Relative: 37 %
Lymphs Abs: 2.4 K/uL (ref 0.7–4.0)
MCH: 29.4 pg (ref 26.0–34.0)
MCHC: 33 g/dL (ref 30.0–36.0)
MCV: 89.1 fL (ref 80.0–100.0)
Monocytes Absolute: 0.7 K/uL (ref 0.1–1.0)
Monocytes Relative: 10 %
Neutro Abs: 3.3 K/uL (ref 1.7–7.7)
Neutrophils Relative %: 49 %
Platelets: 382 K/uL (ref 150–400)
RBC: 4.32 MIL/uL (ref 3.87–5.11)
RDW: 13.2 % (ref 11.5–15.5)
WBC: 6.6 K/uL (ref 4.0–10.5)
nRBC: 0 % (ref 0.0–0.2)

## 2024-10-06 MED ORDER — OMEPRAZOLE 20 MG PO CPDR: DELAYED_RELEASE_CAPSULE | ORAL | 3 refills | Status: AC

## 2024-10-06 MED ORDER — ALUM & MAG HYDROXIDE-SIMETH 200-200-20 MG/5ML PO SUSP
30.0000 mL | Freq: Once | ORAL | Status: AC
  Administered 2024-10-07: 30 mL via ORAL
  Filled 2024-10-06: qty 30

## 2024-10-06 MED ORDER — AMLODIPINE BESYLATE 5 MG PO TABS: 5.0000 mg | ORAL_TABLET | Freq: Every day | ORAL | 3 refills | Status: DC

## 2024-10-06 NOTE — ED Triage Notes (Signed)
 Patient BIB EMS c/o chest pain described as indigestion in the center of her chest. Symptoms began yesterday morning, took antiacids, but today no relief. Declined aspirin  in routes, states I can't take it. PMHx Stroke, HTN, COPD, and PVD.

## 2024-10-06 NOTE — Telephone Encounter (Signed)
 Reached back out to patient to clarify out how she is currently taking her Amlodipine . Patient states she has been taking 1 tablet in the morning and 1 tablet in the afternoon. Patient states her blood pressure has been in the normal range with the current regimen. Patient was informed to continue with the regimen. Patient verbalized understanding. Patient states she would need a new prescription in the future when she is closer to running out of medication for Amlodipine  5 mg BID. Patient states she will call our office closer to the time of being due for a refill. Patient states she was unable to refill her Omeprazole  due to refill too soon. Patient states she has had to increase taking the medication as she has had some increased indigestion or chest pain. Patient was advised to go to the ED or local Urgent Care to be seen as our office was about to close. Patient states she was going to do that anyway, as she was concerned and didn't want anything to be wrong with her hear. Patient verbalized understanding.

## 2024-10-06 NOTE — Telephone Encounter (Unsigned)
 Copied from CRM [id number]. Topic: Clinical - Medication Question >> Oct 06, 2024  3:49 PM Aisha D wrote: Reason for CRM: Pt is wanting to confirm if she should be taking the amLODipine  (NORVASC ) 5 MG tablet twice daily or once daily. Pt stated that she usually takes the medication twice daily but on the new refill it states to take it once daily. Pt would like a callback with an update.

## 2024-10-06 NOTE — Telephone Encounter (Signed)
 Copied from CRM #[id number]. Topic: Clinical - Medication Refill >> Oct 06, 2024 11:22 AM Viola F wrote: Medication:  omeprazole  (PRILOSEC) 20 MG capsule [[id number]] amLODipine  (NORVASC ) 5 MG tablet [[id number]]  Has the patient contacted their pharmacy? Yes (Agent: If no, request that the patient contact the pharmacy for the refill. If patient does not wish to contact the pharmacy document the reason why and proceed with request.) (Agent: If yes, when and what did the pharmacy advise?)  This is the patient's preferred pharmacy:   [id number] [place], [place] - [address] [address] Phone: [phone] Fax: [phone]   Is this the correct pharmacy for this prescription? Yes If no, delete pharmacy and type the correct one.   Has the prescription been filled recently? Yes  Is the patient out of the medication? Yes  Has the patient been seen for an appointment in the last year OR does the patient have an upcoming appointment? Yes  Can we respond through MyChart? Yes  Agent: Please be advised that Rx refills may take up to 3 business days. We ask that you follow-up with your pharmacy.

## 2024-10-06 NOTE — ED Provider Notes (Signed)
 [facility] Provider Note    Event Date/Time   First MD Initiated Contact with Patient 10/06/24 2317     (approximate)   History   Chest Pain   HPI  Ashley Gross is a 73 y.o. female with history of COPD, hypertension, diabetes, hyperlipidemia, stroke, AVM with chronic rectal bleeding not requiring blood transfusion, psoriatic arthritis who presents to the emergency department concerns for chest pain.  States symptoms started yesterday.  Started after eating a ham and cheese sandwich from [employer].  She describes it as a pressure in her chest but thinks this feels like indigestion.  She has taken Tums and Prilosec at home with some relief but still continued to have pain which concerned her daughter who recommended she call 911.  She sees Dr. Florencio with cardiology.  She does not have a history of CAD that she is aware of, CHF, PE or DVT.  No calf tenderness or calf swelling.  She denies any fevers or cough.  No other aggravating or alleviating factors.  Denies any abdominal pain.  States she always has rectal bleeding that is unchanged.  No melena.   History provided by patient, daughter, EMS.    Past Medical History:  Diagnosis Date   Abnormal liver function test 10/06/2012   Allergy 1986   Anemia 01/25/2022   Anemia, unspecified    Asthma 1989   B12 deficiency 02/11/2019   Bleeding internal hemorrhoids 09/13/2017   Cataract 2009   Choledocholithiasis 06/29/2012   Formatting of this note might be different from the original. Dilated CBD on abdominal imaging 04/2012   Chronic diarrhea 02/16/2013   Clotting disorder 2019   Colitis    Complication of anesthesia    asthma attack after shoulder surgery   COPD (chronic obstructive pulmonary disease) (HCC) 2016   Diabetes mellitus (HCC)    type II   Esophagitis    GERD (gastroesophageal reflux disease)    Heart murmur 1952   for years- nothing to be concerned about.   History of kidney stones     Hx of arteriovenous malformation (AVM)    Hypertension    IBS (irritable bowel syndrome)    Loss of weight    Neuromuscular disorder (HCC) 1988   Peripheral vascular disease    Pernicious anemia    Personal history of colonic polyps 02/10/2013   02/08/13 colonoscopy - question of rectal varices, two 3-62mm polyps in the sigmoid colon, mass at the hepatic flexure, one 5mm polyp at the hepatic flexure, nodule at the ileocecal valve, congested mucusa in the entire colon, flattened villi mucosa in the terminal ileum.     Pneumonia    Psoriatic arthritis (HCC)    s/p penicillamine, plaquenil, MTX, sulfasalazine.  s/p Embrel, Humira,.  Iritis.     Pure hypercholesterolemia    Rectal bleeding 07/15/2017   Sleep apnea 04/2023   Stroke [facility]) 2023   per patient    Past Surgical History:  Procedure Laterality Date   ABDOMINAL HYSTERECTOMY  1981   ovaries left in place   ANKLE SURGERY     right   BRAIN SURGERY  November 2023   BUNIONECTOMY     CHOLECYSTECTOMY  1989   COLONOSCOPY WITH PROPOFOL  N/A 12/27/2017   Procedure: COLONOSCOPY WITH PROPOFOL ;  Surgeon: Unk Corinn Skiff, MD;  Location: [facility] ENDOSCOPY;  Service: Gastroenterology;  Laterality: N/A;   COLONOSCOPY WITH PROPOFOL  N/A 01/04/2019   Procedure: COLONOSCOPY WITH PROPOFOL ;  Surgeon: Unk Corinn Skiff, MD;  Location:  [facility];  Service: Endoscopy;  Laterality: N/A;  Diabetic - oral and injectable   COLONOSCOPY WITH PROPOFOL  N/A 07/24/2021   Procedure: COLONOSCOPY WITH PROPOFOL ;  Surgeon: Unk Corinn Skiff, MD;  Location: [facility];  Service: Endoscopy;  Laterality: N/A;  Diabetic   ESOPHAGOGASTRODUODENOSCOPY (EGD) WITH PROPOFOL  N/A 12/27/2017   Procedure: ESOPHAGOGASTRODUODENOSCOPY (EGD) WITH PROPOFOL ;  Surgeon: Unk Corinn Skiff, MD;  Location: [facility] ENDOSCOPY;  Service: Gastroenterology;  Laterality: N/A;   EYE SURGERY  2011 2019   HAND SURGERY     right   HEEL SPUR SURGERY     HERNIA REPAIR  1977& 2023    IR 3D INDEPENDENT WKST  10/01/2021   IR ANGIO INTRA EXTRACRAN SEL COM CAROTID INNOMINATE UNI L MOD SED  10/01/2021   IR ANGIO INTRA EXTRACRAN SEL INTERNAL CAROTID UNI R MOD SED  10/01/2021   IR ANGIO INTRA EXTRACRAN SEL INTERNAL CAROTID UNI R MOD SED  05/29/2022   IR ANGIO VERTEBRAL SEL VERTEBRAL BILAT MOD SED  10/01/2021   IR ANGIO VERTEBRAL SEL VERTEBRAL UNI R MOD SED  05/29/2022   IR CT HEAD LTD  10/01/2021   IR TRANSCATH/EMBOLIZ  10/01/2021   IR US  GUIDE VASC ACCESS RIGHT  10/01/2021   IR US  GUIDE VASC ACCESS RIGHT  05/29/2022   NOSE SURGERY     x2   POLYPECTOMY  01/04/2019   Procedure: POLYPECTOMY;  Surgeon: Unk Corinn Skiff, MD;  Location: [facility];  Service: Endoscopy;;   POLYPECTOMY N/A 07/24/2021   Procedure: POLYPECTOMY;  Surgeon: Unk Corinn Skiff, MD;  Location: [facility];  Service: Endoscopy;  Laterality: N/A;   RADIOLOGY WITH ANESTHESIA N/A 10/01/2021   Procedure: HENDERSON WITH ANESTHESIA;  Surgeon: de Macedo Rodrigues, Katyucia, MD;  Location: [facility];  Service: Radiology;  Laterality: N/A;   SHOULDER ARTHROSCOPY WITH OPEN ROTATOR CUFF REPAIR Left 09/20/2018   Procedure: SHOULDER ARTHROSCOPY WITH OPEN ROTATOR CUFF REPAIR;  Surgeon: Edie Norleen PARAS, MD;  Location: [facility];  Service: Orthopedics;  Laterality: Left;   SHOULDER CLOSED REDUCTION Left 12/21/2018   Procedure: MANIPULATION UNDER ANESTHESIA WITH STEROID INJECTION;  Surgeon: Edie Norleen PARAS, MD;  Location: [facility];  Service: Orthopedics;  Laterality: Left;  Diabetic - insulin  and oral meds   UMBILICAL HERNIA REPAIR     XI ROBOTIC ASSISTED INGUINAL HERNIA REPAIR WITH MESH Left 12/16/2021   Procedure: XI ROBOTIC ASSISTED INGUINAL HERNIA REPAIR WITH MESH;  Surgeon: Desiderio Schanz, MD;  Location: [facility];  Service: General;  Laterality: Left;    MEDICATIONS:  Prior to Admission medications   Medication Sig Start Date End Date Taking? Authorizing Provider  acetaminophen  (TYLENOL )  500 MG tablet Take 2 tablets (1,000 mg total) by mouth every 6 (six) hours as needed for mild pain. 12/16/21   Desiderio Schanz, MD  albuterol  (VENTOLIN  HFA) 108 (90 Base) MCG/ACT inhaler Inhale 2 puffs into the lungs every 6 (six) hours as needed for wheezing or shortness of breath.    [provider]  amLODipine  (NORVASC ) 5 MG tablet Take 1 tablet (5 mg total) by mouth daily. 10/06/24   Glendia Shad, MD  Azelastine  HCl 137 MCG/SPRAY SOLN PLACE 2 SPRAYS INTO BOTH NOSTRILS 2 (TWO) TIMES DAILY. USE IN EACH NOSTRIL AS DIRECTED 11/22/23   Maribeth Camellia MATSU, MD  benzonatate  (TESSALON ) 100 MG capsule Take 1 capsule (100 mg total) by mouth every 8 (eight) hours. 08/25/24   White, Shelba SAUNDERS, NP  cefdinir  (OMNICEF ) 300 MG capsule Take 1 capsule (300  mg total) by mouth 2 (two) times daily. 09/22/24   Glendia Shad, MD  Cholecalciferol  (VITAMIN D ) 50 MCG (2000 UT) tablet Take 2,000 Units by mouth daily.    [provider]  CREON  36000-114000 units CPEP capsule Take 1-2 capsules (36,000-72,000 Units total) by mouth See admin instructions. Take 720000 units with each meal and 36000 units with snacks 07/15/22   Vanga, Rohini Reddy, MD  fluticasone -salmeterol (WIXELA INHUB) 100-50 MCG/ACT AEPB INHALE 1 PUFF INTO THE LUNGS TWICE A DAY 08/11/24   Glendia Shad, MD  hydrALAZINE  (APRESOLINE ) 10 MG tablet Take 1 tablet (10 mg total) by mouth 3 (three) times daily. 08/11/24   Glendia Shad, MD  insulin  degludec (TRESIBA  FLEXTOUCH) 100 UNIT/ML FlexTouch Pen Inject 16 Units into the skin daily. 04/05/23   Glendia Shad, MD  ipratropium (ATROVENT ) 0.03 % nasal spray Place 2 sprays into both nostrils every 12 (twelve) hours. 08/25/24   White, Shelba SAUNDERS, NP  loperamide  (IMODIUM ) 2 MG capsule Take 4 mg by mouth daily.    [provider]  losartan  (COZAAR ) 100 MG tablet TAKE 1 TABLET(100 MG) BY MOUTH AT BEDTIME 01/03/24   Glendia Shad, MD  mometasone  (ELOCON ) 0.1 % ointment Apply 1 Application  topically 2 (two) times daily. 03/16/23   [provider]  omeprazole  (PRILOSEC) 20 MG capsule TAKE 1 CAPSULE(20 MG) BY MOUTH TWICE DAILY 10/06/24   Glendia Shad, MD  ONE TOUCH ULTRA TEST test strip PATIENT NEEDS NEW METER STRIPS AND LANCETS FOR ONE TOUCH. HER INSURANCE NO LONGER COVERS ACCUCHEK. 07/31/15   Glendia Shad, MD  potassium chloride  (KLOR-CON  M) 10 MEQ tablet TAKE 1 TABLET BY MOUTH EVERY DAY 03/14/24   Glendia Shad, MD  promethazine -dextromethorphan (PROMETHAZINE -DM) 6.25-15 MG/5ML syrup Take 5 mLs by mouth at bedtime as needed. 08/25/24   White, Shelba SAUNDERS, NP  rosuvastatin  (CRESTOR ) 10 MG tablet Take 1 tablet (10 mg total) by mouth daily. 04/11/24   Glendia Shad, MD  vedolizumab  (ENTYVIO ) 300 MG injection Inject 300 mg into the vein 4 (four) times a week. 12/23/23   Unk Corinn Skiff, MD  zolpidem  (AMBIEN  CR) 12.5 MG CR tablet Take 1 tablet (12.5 mg total) by mouth at bedtime as needed for sleep. 09/14/24   Neda Jennet LABOR, MD    Physical Exam   Triage Vital Signs: ED Triage Vitals  Encounter Vitals Group     BP 10/06/24 2323 (!) 170/73     Girls Systolic BP Percentile --      Girls Diastolic BP Percentile --      Boys Systolic BP Percentile --      Boys Diastolic BP Percentile --      Pulse Rate 10/06/24 2323 71     Resp 10/06/24 2323 14     Temp --      Temp src --      SpO2 10/06/24 2319 98 %     Weight 10/06/24 2319 139 lb 8.8 oz (63.3 kg)     Height 10/06/24 2319 5' 4 (1.626 m)     Head Circumference --      Peak Flow --      Pain Score 10/06/24 2323 7     Pain Loc --      Pain Education --      Exclude from Growth Chart --     Most recent vital signs: Vitals:   10/07/24 0200 10/07/24 0230  BP: (!) 162/83 (!) 170/80  Pulse: 67 80  Resp: 19 20  Temp:  SpO2: 95% 99%    CONSTITUTIONAL: Alert, responds appropriately to questions. Well-appearing; well-nourished, elderly, pleasant HEAD: Normocephalic, atraumatic EYES: Conjunctivae  clear, pupils appear equal, sclera nonicteric ENT: normal nose; moist mucous membranes NECK: Supple, normal ROM CARD: RRR; S1 and S2 appreciated RESP: Normal chest excursion without splinting or tachypnea; breath sounds clear and equal bilaterally; no wheezes, no rhonchi, no rales, no hypoxia or respiratory distress, speaking full sentences ABD/GI: Non-distended; soft, non-tender, no rebound, no guarding, no peritoneal signs BACK: The back appears normal EXT: Normal ROM in all joints; no deformity noted, no edema, no calf tenderness or calf swelling SKIN: Normal color for age and race; warm; no rash on exposed skin NEURO: Moves all extremities equally, normal speech PSYCH: The patient's mood and manner are appropriate.   ED Results / Procedures / Treatments   LABS: (all labs ordered are listed, but only abnormal results are displayed) Labs Reviewed  COMPREHENSIVE METABOLIC PANEL WITH GFR - Abnormal; Notable for the following components:      Result Value   Glucose, Bld 123 (*)    Total Protein 6.1 (*)    All other components within normal limits  CBC WITH DIFFERENTIAL/PLATELET  TROPONIN T, HIGH SENSITIVITY  TROPONIN T, HIGH SENSITIVITY     EKG:  EKG Interpretation Date/Time:  Friday October 06 2024 23:22:11 EST Ventricular Rate:  64 PR Interval:  166 QRS Duration:  103 QT Interval:  421 QTC Calculation: 435 R Axis:   24  Text Interpretation: Sinus rhythm Abnormal R-wave progression, early transition Confirmed by Neomi Neptune [phone]) on 10/06/2024 11:55:45 PM         RADIOLOGY: My personal review and interpretation of imaging: Chest x-ray clear.  I have personally reviewed all radiology reports.   DG Chest 2 View Result Date: 10/07/2024 EXAM: 2 VIEW(S) XRAY OF THE CHEST 10/06/2024 11:54:24 PM COMPARISON: 07/20/2024 CLINICAL HISTORY: CP CP FINDINGS: LUNGS AND PLEURA: No focal pulmonary opacity. No pleural effusion. No pneumothorax. HEART AND MEDIASTINUM: No acute  abnormality of the cardiac and mediastinal silhouettes. BONES AND SOFT TISSUES: No acute osseous abnormality. IMPRESSION: 1. No acute cardiopulmonary process. Electronically signed by: Franky Crease MD 10/07/2024 12:01 AM EST RP Workstation: [id number]     PROCEDURES:  Critical Care performed: No     Procedures    IMPRESSION / MDM / ASSESSMENT AND PLAN / ED COURSE  I reviewed the triage vital signs and the nursing notes.    Patient here with chest pain.  Does have risk factors for ACS however pain seems atypical in nature.  States it has improved somewhat with omeprazole  and Tums.  The patient is on the cardiac monitor to evaluate for evidence of arrhythmia and/or significant heart rate changes.   DIFFERENTIAL DIAGNOSIS (includes but not limited to):   ACS, esophagitis, esophageal spasm, GERD, less likely CHF, pneumonia, PE, pneumothorax, doubt dissection or esophageal rupture   Patient's presentation is most consistent with acute presentation with potential threat to life or bodily function.   PLAN: Will obtain cardiac labs, chest x-ray.  EKG nonischemic.  Will give Maalox for symptomatic relief and reassess.   MEDICATIONS GIVEN IN ED: Medications  alum & mag hydroxide-simeth (MAALOX/MYLANTA) 200-200-20 MG/5ML suspension 30 mL (30 mLs Oral Given 10/07/24 0015)     ED COURSE: Hemoglobin normal.  Electrolytes normal.  Normal LFTs.  Troponin x 2 negative.    Chest x-ray reviewed and interpreted by myself and radiologist and is unremarkable.  Patient reports feeling much better after Mylanta.  We  discussed that symptoms could be GI in nature.  Daughter is asking for something stronger to send her home on.  She is on omeprazole  20 mg twice daily.  Will change this to Protonix  40 mg twice daily and recommended over-the-counter medication such as Tums, Mylanta or Maalox, Pepcid as needed and recommended diet changes.  We did discuss that she does have risk factors for ACS and I  have recommended close follow-up with Dr. Florencio and to return to the emergency department if any symptoms recur.  She is comfortable with this plan and both her and daughter verbalized understanding.  CONSULTS: Admission considered but pain seems atypical with nonischemic EKG and 2 negative troponins.  I feel she is safe for outpatient management.   OUTSIDE RECORDS REVIEWED: Reviewed recent pulmonology, family medicine notes.       FINAL CLINICAL IMPRESSION(S) / ED DIAGNOSES   Final diagnoses:  Nonspecific chest pain     Rx / DC Orders   ED Discharge Orders          Ordered    pantoprazole  (PROTONIX ) 40 MG tablet  2 times daily before meals        10/07/24 0231             Note:  This document was prepared using Dragon voice recognition software and may include unintentional dictation errors.   Montoya Brandel, Josette SAILOR, DO 10/07/24 [phone]

## 2024-10-06 NOTE — Telephone Encounter (Signed)
 In reviewing the chart, it appears that Ashley Gross changed her amlodipine  to 10mg  q day. I saw her 08/11/24 - per note, she was having increased lower extremity swelling and we decreased her to 5mg  q day). Need to confirm what she is currently taking (dose currently taking) and how her blood pressures are doing. If she is taking 5mg  bid and tolerating and blood pressure ok - then ok to refill 5mg  bid.

## 2024-10-07 LAB — COMPREHENSIVE METABOLIC PANEL WITH GFR
ALT: 14 U/L (ref 0–44)
AST: 20 U/L (ref 15–41)
Albumin: 3.9 g/dL (ref 3.5–5.0)
Alkaline Phosphatase: 118 U/L (ref 38–126)
Anion gap: 12 (ref 5–15)
BUN: 12 mg/dL (ref 8–23)
CO2: 26 mmol/L (ref 22–32)
Calcium: 9.9 mg/dL (ref 8.9–10.3)
Chloride: 102 mmol/L (ref 98–111)
Creatinine, Ser: 0.98 mg/dL (ref 0.44–1.00)
GFR, Estimated: >60 mL/min (ref >60)
Glucose, Bld: 123 mg/dL — ABNORMAL HIGH (ref 70–99)
Potassium: 3.5 mmol/L (ref 3.5–5.1)
Sodium: 140 mmol/L (ref 135–145)
Total Bilirubin: 0.4 mg/dL (ref 0.0–1.2)
Total Protein: 6.1 g/dL — ABNORMAL LOW (ref 6.5–8.1)

## 2024-10-07 LAB — TROPONIN T, HIGH SENSITIVITY
Troponin T High Sensitivity: 19 ng/L (ref 0–19)
Troponin T High Sensitivity: 18 ng/L (ref 0–19)

## 2024-10-07 MED ORDER — AMLODIPINE BESYLATE 5 MG PO TABS: 5.0000 mg | ORAL_TABLET | Freq: Two times a day (BID) | ORAL | 1 refills | Status: AC

## 2024-10-07 MED ORDER — PANTOPRAZOLE SODIUM 40 MG PO TBEC: 40.0000 mg | DELAYED_RELEASE_TABLET | Freq: Two times a day (BID) | ORAL | 1 refills | Status: AC

## 2024-10-07 NOTE — Discharge Instructions (Addendum)
 Please avoid NSAIDs such as aspirin  (Goody powders), ibuprofen (Motrin, Advil), naproxen (Aleve) as these may worsen your symptoms.  Tylenol  1000 mg every 6 hours is safe to take as long as you have no history of liver problems (heavy alcohol use, cirrhosis, hepatitis).  Please avoid spicy, acidic (citrus fruits, tomato based sauces, salsa), greasy, fatty foods.  Please avoid caffeine and alcohol.  Smoking can also make GERD/acid reflux worse.  Over the counter medications such as TUMS, Maalox or Mylanta, pepcid, Prilosec or Nexium may help with your symptoms.  Do not take Prilosec or Nexium if you are already prescribed a proton pump inhibitor.   Please stop taking your Prilosec and begin taking Protonix  twice daily.   Please call Dr. Florencio with cardiology for close follow-up.

## 2024-10-07 NOTE — Addendum Note (Signed)
 Addended by: GLENDIA ALLENA RAMAN on: 10/07/2024 02:56 PM   Modules accepted: Orders

## 2024-10-07 NOTE — Telephone Encounter (Signed)
 Pt evaluated in ED. Rx sent in for amlodipine  with directions 5mg  bid.

## 2024-10-13 ENCOUNTER — Ambulatory Visit: Payer: Self-pay

## 2024-10-13 NOTE — Telephone Encounter (Signed)
 Spoke with pt and she stated that she is not suicidal and has no thoughts of harming herself in any way. Pt is okay with waiting until next to discuss treatment options. She was advised that if her symptoms worsen that she needs to go the ED for treatment. Pt gave a verbal understanding.

## 2024-10-13 NOTE — Telephone Encounter (Signed)
 Please confirm no suicidal ideation. See if ok to wait until next week for me to work her in to discuss treatment.

## 2024-10-13 NOTE — Telephone Encounter (Signed)
 FYI Only or Action Required?: Action required by provider: clinical question for provider and update on patient condition.  Patient was last seen in primary care on 09/22/2024 by Glendia Shad, MD.  Called Nurse Triage reporting Depression.  Symptoms began several weeks ago.  Interventions attempted: Nothing.  Symptoms are: gradually worsening.  Triage Disposition: See Physician Within 24 Hours  Patient/caregiver understands and will follow disposition?: Yes  Copied from CRM #[id number]. Topic: Clinical - Red Word Triage >> Oct 13, 2024  2:27 PM Victoria A wrote: [facility] that prompted transfer to Nurse Triage: Patient called said she is depressed and feeling down. Reason for Disposition  [1] Depression AND [2] getting worse (e.g., sleeping poorly, less able to do activities of daily living)  Answer Assessment - Initial Assessment Questions Pt called in to f/u on visit with PCP 11/21. States PCP offered her an anti-depressant but she declined at the time, was told to call in if anything changed. Pt states she would like to try anti-depressant at this time. Pt tearful, stating this time of year is difficult d/t death of her husband and she retired in July so she is not busy. States she does take Ambien  to sleep so no sleep disturbances, no changes in appetite, but decreased energy level. Please advise.     1. CONCERN: What happened that made you call today?     Pt states at her appt with PCP 11/21, she offered medication for depression but pt didn't feel that she needed anything at the time. Pt states she would like rx now. States that this time of year is difficult d/t loss of spouse, pt retired in July so she isn't as busy and her Daughter lives in [place]. Pt states she lives alone and is very tearful  2. DEPRESSION SYMPTOM SCREENING: How are you feeling overall? (e.g., decreased energy, increased sleeping or difficulty sleeping, difficulty concentrating, feelings of sadness,  guilt, hopelessness, or worthlessness)     Decreased energy, states this past week she just hasn't felt herself. Feeling sadness and lonely   5. FUNCTIONAL IMPAIRMENT: How have things been going for you overall? Have you had more difficulty than usual doing your normal daily activities?  (e.g., better, same, worse; self-care, school, work, interactions)     Pt states she really has not stuck to a routine this week. States she retired in July so she is not busy and her thoughts are just sad. Misses her husband   6. SUPPORT: Who is with you now? Who do you live with? Do you have family or friends who you can talk to?      Lives alone; daughter in [place]   7. THERAPIST: Do you have a counselor or therapist? If Yes, ask: What is their name?     No   8. STRESSORS: Has there been any new stress or recent changes in your life?     No new; states this time of year is hard d/t anniversary of the death of her husband    51. OTHER: Do you have any other physical symptoms right now? (e.g., fever)       No  Protocols used: Depression-A-AH

## 2024-10-13 NOTE — Telephone Encounter (Signed)
 Pt called and stated that she is still depressed and feeling down. She stated that at her last appt her and Dr. Glendia discussed starting an anti depressant medication but pt decided not to at the time. Pt would now like to start a medication. She stated that she is taking the Ambien  at night so she does not have any trouble sleeping.

## 2024-10-14 NOTE — Telephone Encounter (Signed)
 See if she can come in 10/20/24 at 9:00. Thanks.

## 2024-10-16 NOTE — Telephone Encounter (Signed)
 Appt made.

## 2024-10-20 ENCOUNTER — Encounter: Payer: Self-pay | Admitting: Internal Medicine

## 2024-10-20 ENCOUNTER — Ambulatory Visit (INDEPENDENT_AMBULATORY_CARE_PROVIDER_SITE_OTHER): Admitting: Internal Medicine

## 2024-10-20 VITALS — BP 130/70 | HR 91 | Temp 98.4°F | Ht 64.0 in | Wt 135.2 lb

## 2024-10-20 DIAGNOSIS — I10 Essential (primary) hypertension: Secondary | ICD-10-CM

## 2024-10-20 DIAGNOSIS — L4050 Arthropathic psoriasis, unspecified: Secondary | ICD-10-CM | POA: Diagnosis not present

## 2024-10-20 DIAGNOSIS — Z794 Long term (current) use of insulin: Secondary | ICD-10-CM

## 2024-10-20 DIAGNOSIS — E7800 Pure hypercholesterolemia, unspecified: Secondary | ICD-10-CM

## 2024-10-20 DIAGNOSIS — E1165 Type 2 diabetes mellitus with hyperglycemia: Secondary | ICD-10-CM

## 2024-10-20 DIAGNOSIS — F322 Major depressive disorder, single episode, severe without psychotic features: Secondary | ICD-10-CM | POA: Diagnosis not present

## 2024-10-20 DIAGNOSIS — F439 Reaction to severe stress, unspecified: Secondary | ICD-10-CM | POA: Diagnosis not present

## 2024-10-20 DIAGNOSIS — G479 Sleep disorder, unspecified: Secondary | ICD-10-CM | POA: Diagnosis not present

## 2024-10-20 MED ORDER — HYDRALAZINE HCL 10 MG PO TABS: 10.0000 mg | ORAL_TABLET | Freq: Three times a day (TID) | ORAL | 2 refills | Status: AC

## 2024-10-20 MED ORDER — ESCITALOPRAM OXALATE 5 MG PO TABS: 5.0000 mg | ORAL_TABLET | Freq: Every day | ORAL | 2 refills | Status: DC

## 2024-10-20 NOTE — Progress Notes (Signed)
 "  Subjective:    Patient ID: Ashley Gross, female    DOB: 09-27-1951, 73 y.o.   MRN: [id number]  Patient here for  Chief Complaint  Patient presents with   Medical Management of Chronic Issues    HPI Work in appt - work in to discuss increased stress and starting medication. Evaluated 10/06/24 - chest pain. CXR unremarkable. Troponin x 2 negative. Normal LFTs. Given mylanta. Prilosec changed to protonix  40mg  bid. Pepcid prn. She is accompanied by her daughter Shary). History obtained from both of them. Reports increased stress. Increased depression. Sleeping in. Staying in her pajamas. Not wanting to go out. Not wanting to do things she was previously interested in. No SI. Discussed eating. Discussed medications and sleep issues.    Past Medical History:  Diagnosis Date   Abnormal liver function test 10/06/2012   Allergy 1986   Anemia 01/25/2022   Anemia, unspecified    Asthma 1989   B12 deficiency 02/11/2019   Bleeding internal hemorrhoids 09/13/2017   Cataract 2009   Choledocholithiasis 06/29/2012   Formatting of this note might be different from the original. Dilated CBD on abdominal imaging 04/2012   Chronic diarrhea 02/16/2013   Clotting disorder 2019   Colitis    Complication of anesthesia    asthma attack after shoulder surgery   COPD (chronic obstructive pulmonary disease) (HCC) 2016   Diabetes mellitus (HCC)    type II   Esophagitis    GERD (gastroesophageal reflux disease)    Heart murmur 1952   for years- nothing to be concerned about.   History of kidney stones    Hx of arteriovenous malformation (AVM)    Hypertension    IBS (irritable bowel syndrome)    Loss of weight    Neuromuscular disorder (HCC) 1988   Peripheral vascular disease    Pernicious anemia    Personal history of colonic polyps 02/10/2013   02/08/13 colonoscopy - question of rectal varices, two 3-51mm polyps in the sigmoid colon, mass at the hepatic flexure, one 5mm polyp at the hepatic  flexure, nodule at the ileocecal valve, congested mucusa in the entire colon, flattened villi mucosa in the terminal ileum.     Pneumonia    Psoriatic arthritis (HCC)    s/p penicillamine, plaquenil, MTX, sulfasalazine.  s/p Embrel, Humira,.  Iritis.     Pure hypercholesterolemia    Rectal bleeding 07/15/2017   Sleep apnea 04/2023   Stroke [facility]) 2023   per patient   Past Surgical History:  Procedure Laterality Date   ABDOMINAL HYSTERECTOMY  1981   ovaries left in place   ANKLE SURGERY     right   BRAIN SURGERY  November 2023   BUNIONECTOMY     CHOLECYSTECTOMY  1989   COLONOSCOPY WITH PROPOFOL  N/A 12/27/2017   Procedure: COLONOSCOPY WITH PROPOFOL ;  Surgeon: Unk Corinn Skiff, MD;  Location: [facility] ENDOSCOPY;  Service: Gastroenterology;  Laterality: N/A;   COLONOSCOPY WITH PROPOFOL  N/A 01/04/2019   Procedure: COLONOSCOPY WITH PROPOFOL ;  Surgeon: Unk Corinn Skiff, MD;  Location: [facility] SURGERY CNTR;  Service: Endoscopy;  Laterality: N/A;  Diabetic - oral and injectable   COLONOSCOPY WITH PROPOFOL  N/A 07/24/2021   Procedure: COLONOSCOPY WITH PROPOFOL ;  Surgeon: Unk Corinn Skiff, MD;  Location: [facility] SURGERY [facility]TR;  Service: Endoscopy;  Laterality: N/A;  Diabetic   ESOPHAGOGASTRODUODENOSCOPY (EGD) WITH PROPOFOL  N/A 12/27/2017   Procedure: ESOPHAGOGASTRODUODENOSCOPY (EGD) WITH PROPOFOL ;  Surgeon: Unk Corinn Skiff, MD;  Location: [facility] ENDOSCOPY;  Service: Gastroenterology;  Laterality: N/A;  EYE SURGERY  2011 2019   HAND SURGERY     right   HEEL SPUR SURGERY     HERNIA REPAIR  1977& 2023   IR 3D INDEPENDENT WKST  10/01/2021   IR ANGIO INTRA EXTRACRAN SEL COM CAROTID INNOMINATE UNI L MOD SED  10/01/2021   IR ANGIO INTRA EXTRACRAN SEL INTERNAL CAROTID UNI R MOD SED  10/01/2021   IR ANGIO INTRA EXTRACRAN SEL INTERNAL CAROTID UNI R MOD SED  05/29/2022   IR ANGIO VERTEBRAL SEL VERTEBRAL BILAT MOD SED  10/01/2021   IR ANGIO VERTEBRAL SEL VERTEBRAL UNI R MOD SED  05/29/2022   IR CT  HEAD LTD  10/01/2021   IR TRANSCATH/EMBOLIZ  10/01/2021   IR US  GUIDE VASC ACCESS RIGHT  10/01/2021   IR US  GUIDE VASC ACCESS RIGHT  05/29/2022   NOSE SURGERY     x2   POLYPECTOMY  01/04/2019   Procedure: POLYPECTOMY;  Surgeon: Unk Corinn Skiff, MD;  Location: [facility] SURGERY CNTR;  Service: Endoscopy;;   POLYPECTOMY N/A 07/24/2021   Procedure: POLYPECTOMY;  Surgeon: Unk Corinn Skiff, MD;  Location: [facility] SURGERY [facility];  Service: Endoscopy;  Laterality: N/A;   RADIOLOGY WITH ANESTHESIA N/A 10/01/2021   Procedure: HENDERSON WITH ANESTHESIA;  Surgeon: de Macedo Rodrigues, Katyucia, MD;  Location: [facility] OR;  Service: Radiology;  Laterality: N/A;   SHOULDER ARTHROSCOPY WITH OPEN ROTATOR CUFF REPAIR Left 09/20/2018   Procedure: SHOULDER ARTHROSCOPY WITH OPEN ROTATOR CUFF REPAIR;  Surgeon: Edie Norleen PARAS, MD;  Location: [facility] ORS;  Service: Orthopedics;  Laterality: Left;   SHOULDER CLOSED REDUCTION Left 12/21/2018   Procedure: MANIPULATION UNDER ANESTHESIA WITH STEROID INJECTION;  Surgeon: Edie Norleen PARAS, MD;  Location: [facility] SURGERY [facility]TR;  Service: Orthopedics;  Laterality: Left;  Diabetic - insulin  and oral meds   UMBILICAL HERNIA REPAIR     XI ROBOTIC ASSISTED INGUINAL HERNIA REPAIR WITH MESH Left 12/16/2021   Procedure: XI ROBOTIC ASSISTED INGUINAL HERNIA REPAIR WITH MESH;  Surgeon: Desiderio Schanz, MD;  Location: [facility] ORS;  Service: General;  Laterality: Left;   Family History  Problem Relation Age of Onset   Diabetes Mother    Hypertension Mother    Arthritis Father    Diabetes Father    Heart disease Father    Hypertension Father    Diabetes Other    Hypertension Other    ADD / ADHD Daughter    Asthma Daughter    ADD / ADHD Daughter    Asthma Daughter    Breast cancer Neg Hx    Colon cancer Neg Hx    Social History   Socioeconomic History   Marital status: Widowed    Spouse name: Not on file   Number of children: 2   Years of education: Not on file   Highest education  level: Associate degree: occupational, scientist, product/process development, or vocational program  Occupational History   Not on file  Tobacco Use   Smoking status: Never    Passive exposure: Never   Smokeless tobacco: Never  Vaping Use   Vaping status: Never Used  Substance and Sexual Activity   Alcohol use: Never   Drug use: Never   Sexual activity: Not Currently  Other Topics Concern   Not on file  Social History Narrative   She is married and has two children   Grand daughter lives with her.   Social Drivers of Health   Tobacco Use: Low Risk (10/29/2024)   Patient History    Smoking Tobacco Use: Never  Smokeless Tobacco Use: Never    Passive Exposure: Never  Financial Resource Strain: Low Risk  (09/12/2024)   Received from [facility]   Overall Financial Resource Strain (CARDIA)    Difficulty of Paying Living Expenses: Not hard at all  Food Insecurity: No Food Insecurity (09/12/2024)   Received from [facility]   Epic    Within the past 12 months, you worried that your food would run out before you got the money to buy more.: Never true    Within the past 12 months, the food you bought just didn't last and you didn't have money to get more.: Never true  Transportation Needs: No Transportation Needs (09/12/2024)   Received from [facility] - Transportation    In the past 12 months, has lack of transportation kept you from medical appointments or from getting medications?: No    Lack of Transportation (Non-Medical): No  Physical Activity: Insufficiently Active (04/24/2024)   Exercise Vital Sign    Days of Exercise per Week: 2 days    Minutes of Exercise per Session: 20 min  Stress: Stress Concern Present (04/24/2024)   [employer] of [employer]ational [employer] - Occupational Stress Questionnaire    Feeling of Stress: To some extent  Social Connections: Moderately Integrated (04/24/2024)   Social Connection and Isolation Panel     Frequency of Communication with Friends and Family: More than three times a week    Frequency of Social Gatherings with Friends and Family: More than three times a week    Attends Religious Services: More than 4 times per year    Active Member of [employer] or Organizations: Yes    Attends Banker Meetings: More than 4 times per year    Marital Status: Widowed  Depression (PHQ2-9): High Risk (10/20/2024)   Depression (PHQ2-9)    PHQ-2 Score: 18  Alcohol Screen: Low Risk (04/24/2024)   Alcohol Screen    Last Alcohol Screening Score (AUDIT): 0  Housing: Unknown (09/12/2024)   Received from [facility]   Epic    In the last 12 months, was there a time when you were not able to pay the mortgage or rent on time?: No    Number of Times Moved in the Last Year: Not on file    At any time in the past 12 months, were you homeless or living in a shelter (including now)?: No  Utilities: Not At Risk (09/12/2024)   Received from [facility]   Epic    In the past 12 months has the electric, gas, oil, or water  company threatened to shut off services in your home?: No  Health Literacy: Adequate Health Literacy (04/28/2024)   B1300 Health Literacy    Frequency of need for help with medical instructions: Never     Review of Systems  Constitutional:  Positive for fatigue. Negative for unexpected weight change.  HENT:  Negative for congestion and sinus pressure.   Respiratory:  Negative for cough, chest tightness and shortness of breath.   Cardiovascular:  Negative for chest pain and palpitations.       No increased swelling.   Gastrointestinal:  Negative for abdominal pain and vomiting.  Genitourinary:  Negative for difficulty urinating and dysuria.  Musculoskeletal:  Negative for joint swelling and myalgias.  Skin:  Negative for color change and rash.  Neurological:  Negative for dizziness and headaches.  Psychiatric/Behavioral:  Negative for agitation.  Increased stress and depression as outlined.        Objective:     BP 130/70   Pulse 91   Temp 98.4 F (36.9 C) (Oral)   Ht 5' 4 (1.626 m)   Wt 135 lb 3.2 oz (61.3 kg)   SpO2 96%   BMI 23.21 kg/m  Wt Readings from Last 3 Encounters:  10/21/24 138 lb (62.6 kg)  10/20/24 135 lb 3.2 oz (61.3 kg)  10/06/24 139 lb 8.8 oz (63.3 kg)    Physical Exam Vitals reviewed.  Constitutional:      General: She is not in acute distress.    Appearance: Normal appearance.  HENT:     Head: Normocephalic and atraumatic.     Right Ear: External ear normal.     Left Ear: External ear normal.     Mouth/Throat:     Pharynx: No oropharyngeal exudate or posterior oropharyngeal erythema.  Eyes:     General: No scleral icterus.       Right eye: No discharge.        Left eye: No discharge.     Conjunctiva/sclera: Conjunctivae normal.  Neck:     Thyroid : No thyromegaly.  Cardiovascular:     Rate and Rhythm: Normal rate and regular rhythm.  Pulmonary:     Effort: No respiratory distress.     Breath sounds: Normal breath sounds. No wheezing.  Abdominal:     General: Bowel sounds are normal.     Palpations: Abdomen is soft.     Tenderness: There is no abdominal tenderness.  Musculoskeletal:        General: No swelling or tenderness.     Cervical back: Neck supple. No tenderness.  Lymphadenopathy:     Cervical: No cervical adenopathy.  Skin:    Findings: No erythema or rash.  Neurological:     Mental Status: She is alert.  Psychiatric:        Mood and Affect: Mood normal.        Behavior: Behavior normal.         Outpatient Encounter Medications as of 10/20/2024  Medication Sig   acetaminophen  (TYLENOL ) 500 MG tablet Take 2 tablets (1,000 mg total) by mouth every 6 (six) hours as needed for mild pain.   albuterol  (VENTOLIN  HFA) 108 (90 Base) MCG/ACT inhaler Inhale 2 puffs into the lungs every 6 (six) hours as needed for wheezing or shortness of breath.   amLODipine   (NORVASC ) 5 MG tablet Take 1 tablet (5 mg total) by mouth 2 (two) times daily.   Azelastine  HCl 137 MCG/SPRAY SOLN PLACE 2 SPRAYS INTO BOTH NOSTRILS 2 (TWO) TIMES DAILY. USE IN EACH NOSTRIL AS DIRECTED   benzonatate  (TESSALON ) 100 MG capsule Take 1 capsule (100 mg total) by mouth every 8 (eight) hours.   Cholecalciferol  (VITAMIN D ) 50 MCG (2000 UT) tablet Take 2,000 Units by mouth daily.   CREON  36000-114000 units CPEP capsule Take 1-2 capsules (36,000-72,000 Units total) by mouth See admin instructions. Take 720000 units with each meal and 36000 units with snacks   escitalopram  (LEXAPRO ) 5 MG tablet Take 1 tablet (5 mg total) by mouth daily.   fluticasone -salmeterol (WIXELA INHUB) 100-50 MCG/ACT AEPB INHALE 1 PUFF INTO THE LUNGS TWICE A DAY   insulin  degludec (TRESIBA  FLEXTOUCH) 100 UNIT/ML FlexTouch Pen Inject 16 Units into the skin daily.   ipratropium (ATROVENT ) 0.03 % nasal spray Place 2 sprays into both nostrils every 12 (twelve) hours.   loperamide  (IMODIUM ) 2 MG capsule Take  4 mg by mouth daily.   losartan  (COZAAR ) 100 MG tablet TAKE 1 TABLET(100 MG) BY MOUTH AT BEDTIME   mometasone  (ELOCON ) 0.1 % ointment Apply 1 Application topically 2 (two) times daily.   omeprazole  (PRILOSEC) 20 MG capsule TAKE 1 CAPSULE(20 MG) BY MOUTH TWICE DAILY   ONE TOUCH ULTRA TEST test strip PATIENT NEEDS NEW METER STRIPS AND LANCETS FOR ONE TOUCH. HER INSURANCE NO LONGER COVERS ACCUCHEK.   pantoprazole  (PROTONIX ) 40 MG tablet Take 1 tablet (40 mg total) by mouth 2 (two) times daily before a meal.   potassium chloride  (KLOR-CON  M) 10 MEQ tablet TAKE 1 TABLET BY MOUTH EVERY DAY   rosuvastatin  (CRESTOR ) 10 MG tablet Take 1 tablet (10 mg total) by mouth daily.   vedolizumab  (ENTYVIO ) 300 MG injection Inject 300 mg into the vein 4 (four) times a week.   zolpidem  (AMBIEN  CR) 12.5 MG CR tablet Take 1 tablet (12.5 mg total) by mouth at bedtime as needed for sleep.   hydrALAZINE  (APRESOLINE ) 10 MG tablet Take 1 tablet  (10 mg total) by mouth 3 (three) times daily.   [DISCONTINUED] cefdinir  (OMNICEF ) 300 MG capsule Take 1 capsule (300 mg total) by mouth 2 (two) times daily.   [DISCONTINUED] hydrALAZINE  (APRESOLINE ) 10 MG tablet Take 1 tablet (10 mg total) by mouth 3 (three) times daily.   [DISCONTINUED] promethazine -dextromethorphan (PROMETHAZINE -DM) 6.25-15 MG/5ML syrup Take 5 mLs by mouth at bedtime as needed.   No facility-administered encounter medications on file as of 10/20/2024.     Lab Results  Component Value Date   WBC 7.2 10/21/2024   HGB 12.9 10/21/2024   HCT 39.0 10/21/2024   PLT 275 10/21/2024   GLUCOSE 159 (H) 10/21/2024   CHOL 132 08/09/2024   TRIG 72.0 08/09/2024   HDL 52.00 08/09/2024   LDLDIRECT 74.0 04/19/2019   LDLCALC 66 08/09/2024   ALT 14 10/06/2024   AST 20 10/06/2024   NA 142 10/21/2024   K 3.1 (L) 10/21/2024   CL 103 10/21/2024   CREATININE 1.03 (H) 10/21/2024   BUN 11 10/21/2024   CO2 22 10/21/2024   TSH 2.94 04/05/2024   INR 0.9 05/04/2024   HGBA1C 6.1 08/09/2024   MICROALBUR 9.0 (H) 08/09/2024    DG Chest 2 View Result Date: 10/07/2024 EXAM: 2 VIEW(S) XRAY OF THE CHEST 10/06/2024 11:54:24 PM COMPARISON: 07/20/2024 CLINICAL HISTORY: CP CP FINDINGS: LUNGS AND PLEURA: No focal pulmonary opacity. No pleural effusion. No pneumothorax. HEART AND MEDIASTINUM: No acute abnormality of the cardiac and mediastinal silhouettes. BONES AND SOFT TISSUES: No acute osseous abnormality. IMPRESSION: 1. No acute cardiopulmonary process. Electronically signed by: Franky Crease MD 10/07/2024 12:01 AM EST RP Workstation: [id number]       Assessment & Plan:  Type 2 diabetes mellitus with hyperglycemia, with long-term current use of insulin  (HCC) Assessment & Plan: Continues on tresiba . Follow met b and A1c.  Lab Results  Component Value Date   HGBA1C 6.1 08/09/2024      Stress Assessment & Plan: Increased stress. Discussed. Associated depression. Start lexapro  as outlined.  Follow.    Sleep difficulties Assessment & Plan: Continue cpap. Hold ambien . She feels is causing her to be off balance. Discussed magnesium glycinate. Follow.    Psoriatic arthritis (HCC) Assessment & Plan: Followed by rheumatology. Stable.    Primary hypertension Assessment & Plan: Continues on losartan  and amlodipine  and hydralazine . Blood pressure as outlined. Continue current medication regimen. Follow pressures. Follow metabolic panel.    Hypercholesterolemia Assessment & Plan: Continue crestor .  Follow lipid panel.  Lab Results  Component Value Date   CHOL 132 08/09/2024   HDL 52.00 08/09/2024   LDLCALC 66 08/09/2024   LDLDIRECT 74.0 04/19/2019   TRIG 72.0 08/09/2024   CHOLHDL 3 08/09/2024        Moderately severe major depression (HCC) Assessment & Plan: Discussed today. Not going out as much. Not wanting to do things she previously enjoyed. Staying in her pajamas, etc. Discussed treatment options. Discussed counseling/psychiatry referral. Start lexapro  as directed. Follow. Get her back in soon to reassess. Denies SI.    Other orders -     hydrALAZINE  HCl; Take 1 tablet (10 mg total) by mouth 3 (three) times daily.  Dispense: 90 tablet; Refill: 2 -     Escitalopram  Oxalate; Take 1 tablet (5 mg total) by mouth daily.  Dispense: 30 tablet; Refill: 2     Allena Hamilton, MD "

## 2024-10-20 NOTE — Patient Instructions (Signed)
 Magnesium glycinate - before bed

## 2024-10-21 ENCOUNTER — Other Ambulatory Visit: Payer: Self-pay

## 2024-10-21 ENCOUNTER — Emergency Department
Admission: EM | Admit: 2024-10-21 | Discharge: 2024-10-21 | Disposition: A | Discharge status: Home / Self Care | Attending: Emergency Medicine | Admitting: Emergency Medicine

## 2024-10-21 ENCOUNTER — Emergency Department

## 2024-10-21 DIAGNOSIS — J449 Chronic obstructive pulmonary disease, unspecified: Secondary | ICD-10-CM | POA: Diagnosis not present

## 2024-10-21 DIAGNOSIS — E876 Hypokalemia: Secondary | ICD-10-CM | POA: Diagnosis not present

## 2024-10-21 DIAGNOSIS — I10 Essential (primary) hypertension: Secondary | ICD-10-CM | POA: Diagnosis not present

## 2024-10-21 DIAGNOSIS — R002 Palpitations: Secondary | ICD-10-CM

## 2024-10-21 DIAGNOSIS — R0789 Other chest pain: Secondary | ICD-10-CM | POA: Diagnosis present

## 2024-10-21 LAB — CBC
HCT: 39 % (ref 36.0–46.0)
Hemoglobin: 12.9 g/dL (ref 12.0–15.0)
MCH: 29.1 pg (ref 26.0–34.0)
MCHC: 33.1 g/dL (ref 30.0–36.0)
MCV: 88 fL (ref 80.0–100.0)
Platelets: 275 K/uL (ref 150–400)
RBC: 4.43 MIL/uL (ref 3.87–5.11)
RDW: 13.9 % (ref 11.5–15.5)
WBC: 7.2 K/uL (ref 4.0–10.5)
nRBC: 0 % (ref 0.0–0.2)

## 2024-10-21 LAB — BASIC METABOLIC PANEL WITH GFR
Anion gap: 17 — ABNORMAL HIGH (ref 5–15)
BUN: 11 mg/dL (ref 8–23)
CO2: 22 mmol/L (ref 22–32)
Calcium: 9.3 mg/dL (ref 8.9–10.3)
Chloride: 103 mmol/L (ref 98–111)
Creatinine, Ser: 1.03 mg/dL — ABNORMAL HIGH (ref 0.44–1.00)
GFR, Estimated: 57 mL/min — ABNORMAL LOW
Glucose, Bld: 159 mg/dL — ABNORMAL HIGH (ref 70–99)
Potassium: 3.1 mmol/L — ABNORMAL LOW (ref 3.5–5.1)
Sodium: 142 mmol/L (ref 135–145)

## 2024-10-21 LAB — TROPONIN T, HIGH SENSITIVITY
Troponin T High Sensitivity: 24 ng/L — ABNORMAL HIGH (ref 0–19)
Troponin T High Sensitivity: 20 ng/L — ABNORMAL HIGH (ref 0–19)

## 2024-10-21 NOTE — Discharge Instructions (Addendum)
 Please hold your Lexapro  until follow-up with your primary care physician

## 2024-10-21 NOTE — ED Provider Notes (Signed)
 "  [facility] Provider Note   Event Date/Time   First MD Initiated Contact with Patient 10/21/24 [phone]     (approximate) History  Palpitations  HPI Ashley Gross is a 73 y.o. female with a past medical history of anemia, IBS, psoriatic arthritis, COPD, anxiety/depression, and hypertension who presents complaining of palpitations and chest discomfort after starting Lexapro  last night.  Patient states that she looked at the side effects of Lexapro  and one of them is tachycardia.  Patient also reports a history of intermittent sinus tachycardia.  Patient states some chest heaviness but denies any pain.  Patient denies any worsening with exertion.  Patient denies any associated shortness of breath. ROS: Patient currently denies any vision changes, tinnitus, difficulty speaking, facial droop, sore throat, abdominal pain, nausea/vomiting/diarrhea, dysuria, or weakness/numbness/paresthesias in any extremity   Physical Exam  Triage Vital Signs: ED Triage Vitals  Encounter Vitals Group     BP 10/21/24 0225 (!) 158/79     Girls Systolic BP Percentile --      Girls Diastolic BP Percentile --      Boys Systolic BP Percentile --      Boys Diastolic BP Percentile --      Pulse Rate 10/21/24 0225 85     Resp 10/21/24 0225 20     Temp 10/21/24 0225 97.6 F (36.4 C)     Temp Source 10/21/24 0225 Oral     SpO2 10/21/24 0225 100 %     Weight 10/21/24 0228 138 lb (62.6 kg)     Height 10/21/24 0228 5' 4 (1.626 m)     Head Circumference --      Peak Flow --      Pain Score 10/21/24 0228 2     Pain Loc --      Pain Education --      Exclude from Growth Chart --    Most recent vital signs: Vitals:   10/21/24 0225 10/21/24 0448  BP: (!) 158/79 (!) 161/67  Pulse: 85 61  Resp: 20 16  Temp: 97.6 F (36.4 C) 97.6 F (36.4 C)  SpO2: 100% 99%   General: Awake, oriented x4. CV:  Good peripheral perfusion. Resp:  Normal effort. Abd:  No distention. Other:  Elderly  well-developed, well-nourished Caucasian female resting comfortably in no acute distress ED Results / Procedures / Treatments  Labs (all labs ordered are listed, but only abnormal results are displayed) Labs Reviewed  BASIC METABOLIC PANEL WITH GFR - Abnormal; Notable for the following components:      Result Value   Potassium 3.1 (*)    Glucose, Bld 159 (*)    Creatinine, Ser 1.03 (*)    GFR, Estimated 57 (*)    Anion gap 17 (*)    All other components within normal limits  TROPONIN T, HIGH SENSITIVITY - Abnormal; Notable for the following components:   Troponin T High Sensitivity 24 (*)    All other components within normal limits  TROPONIN T, HIGH SENSITIVITY - Abnormal; Notable for the following components:   Troponin T High Sensitivity 20 (*)    All other components within normal limits  CBC   EKG ED ECG REPORT I, Artist MARLA Kerns, the attending physician, personally viewed and interpreted this ECG. Date: 10/21/2024 EKG Time: 0232 Rate: 87 Rhythm: normal sinus rhythm QRS Axis: normal Intervals: normal ST/T Wave abnormalities: normal Narrative Interpretation: no evidence of acute ischemia RADIOLOGY ED MD interpretation: 2 view chest x-ray interpreted by me  shows no evidence of acute abnormalities including no pneumonia, pneumothorax, or widened mediastinum - All radiology independently interpreted and agree with radiology assessment Official radiology report(s): DG Chest 2 View Result Date: 10/21/2024 CLINICAL DATA:  Chest pain EXAM: CHEST - 2 VIEW COMPARISON:  10/06/2024 FINDINGS: The heart size and mediastinal contours are within normal limits. Both lungs are clear. The visualized skeletal structures are unremarkable. IMPRESSION: No active cardiopulmonary disease. Electronically Signed   By: Oneil Devonshire M.D.   On: 10/21/2024 03:04   PROCEDURES: Critical Care performed: No Procedures MEDICATIONS ORDERED IN ED: Medications - No data to display IMPRESSION / MDM /  ASSESSMENT AND PLAN / ED COURSE  I reviewed the triage vital signs and the nursing notes.                             The patient is on the cardiac monitor to evaluate for evidence of arrhythmia and/or significant heart rate changes. Patient's presentation is most consistent with acute presentation with potential threat to life or bodily function. Patient is a 73 year old female who presents complaining of palpitations and chest discomfort after starting Lexapro  last night. DDx: ACS, arrhythmia, pneumonia, pneumothorax, pericarditis Plan: CBC, BMP, troponin, chest x-ray, EKG  Patient has mildly elevated troponin at 24 that is down trended from 24-20.  Given patient's significant hypertension with systolics in the 200s and route, suspect possible mild myocardial injury.  EKG shows no signs of ischemia and patient states that her pain is improving.  No evidence of arrhythmia.  Discussed with patient the importance of holding her Lexapro  until follow-up with her primary care physician.  Patient also informed of mild hypokalemia with a potassium of 3.1.  I offered patient repletion here however states that she feels comfortable going home and continuing supplementation with multivitamin and electrolyte solution.  Patient shows no red flag symptomatology and is stable for discharge at this time.  Patient agrees with discharge and outpatient follow-up  Dispo: Discharge home with PCP follow-up   FINAL CLINICAL IMPRESSION(S) / ED DIAGNOSES   Final diagnoses:  Palpitations  Hypertension, unspecified type   Rx / DC Orders   ED Discharge Orders     None      Note:  This document was prepared using Dragon voice recognition software and may include unintentional dictation errors.   Krystalyn Kubota K, MD 10/21/24 0730  "

## 2024-10-21 NOTE — ED Notes (Signed)
 See triage note  Presents with some palpations  Also states her b/p was elevated

## 2024-10-21 NOTE — ED Triage Notes (Signed)
 Pt presents for palpitations and increased BP at home (reported systolic of over 799) after starting on Lexipro tonight. Denies chest pain but states there is a weird feeling in my heart that makes me feel like Im running. Endorsing anxiety, denies nausea and vomiting.   Past Medical History:  Diagnosis Date   Abnormal liver function test 10/06/2012   Allergy 1986   Anemia 01/25/2022   Anemia, unspecified    Asthma 1989   B12 deficiency 02/11/2019   Bleeding internal hemorrhoids 09/13/2017   Cataract 2009   Choledocholithiasis 06/29/2012   Formatting of this note might be different from the original. Dilated CBD on abdominal imaging 04/2012   Chronic diarrhea 02/16/2013   Clotting disorder 2019   Colitis    Complication of anesthesia    asthma attack after shoulder surgery   COPD (chronic obstructive pulmonary disease) (HCC) 2016   Diabetes mellitus (HCC)    type II   Esophagitis    GERD (gastroesophageal reflux disease)    Heart murmur 1952   for years- nothing to be concerned about.   History of kidney stones    Hx of arteriovenous malformation (AVM)    Hypertension    IBS (irritable bowel syndrome)    Loss of weight    Neuromuscular disorder (HCC) 1988   Peripheral vascular disease    Pernicious anemia    Personal history of colonic polyps 02/10/2013   02/08/13 colonoscopy - question of rectal varices, two 3-75mm polyps in the sigmoid colon, mass at the hepatic flexure, one 5mm polyp at the hepatic flexure, nodule at the ileocecal valve, congested mucusa in the entire colon, flattened villi mucosa in the terminal ileum.     Pneumonia    Psoriatic arthritis (HCC)    s/p penicillamine, plaquenil, MTX, sulfasalazine.  s/p Embrel, Humira,.  Iritis.     Pure hypercholesterolemia    Rectal bleeding 07/15/2017   Sleep apnea 04/2023   Stroke [facility]) 2023   per patient

## 2024-10-29 ENCOUNTER — Encounter: Payer: Self-pay | Admitting: Internal Medicine

## 2024-10-29 DIAGNOSIS — F322 Major depressive disorder, single episode, severe without psychotic features: Secondary | ICD-10-CM | POA: Insufficient documentation

## 2024-10-29 NOTE — Assessment & Plan Note (Signed)
Followed by rheumatology.  Stable.  

## 2024-10-29 NOTE — Assessment & Plan Note (Signed)
 Continues on losartan  and amlodipine  and hydralazine . Blood pressure as outlined. Continue current medication regimen. Follow pressures. Follow metabolic panel.

## 2024-10-29 NOTE — Assessment & Plan Note (Signed)
 Increased stress. Discussed. Associated depression. Start lexapro  as outlined. Follow.

## 2024-10-29 NOTE — Assessment & Plan Note (Signed)
 Discussed today. Not going out as much. Not wanting to do things she previously enjoyed. Staying in her pajamas, etc. Discussed treatment options. Discussed counseling/psychiatry referral. Start lexapro  as directed. Follow. Get her back in soon to reassess. Denies SI.

## 2024-10-29 NOTE — Assessment & Plan Note (Signed)
 Continue crestor . Follow lipid panel.  Lab Results  Component Value Date   CHOL 132 08/09/2024   HDL 52.00 08/09/2024   LDLCALC 66 08/09/2024   LDLDIRECT 74.0 04/19/2019   TRIG 72.0 08/09/2024   CHOLHDL 3 08/09/2024

## 2024-10-29 NOTE — Assessment & Plan Note (Signed)
 Continue cpap. Hold ambien . She feels is causing her to be off balance. Discussed magnesium glycinate. Follow.

## 2024-10-29 NOTE — Assessment & Plan Note (Signed)
 Continues on tresiba . Follow met b and A1c.  Lab Results  Component Value Date   HGBA1C 6.1 08/09/2024

## 2024-10-30 ENCOUNTER — Encounter: Payer: Self-pay | Admitting: Emergency Medicine

## 2024-10-30 ENCOUNTER — Ambulatory Visit
Admission: EM | Admit: 2024-10-30 | Discharge: 2024-10-30 | Disposition: A | Discharge status: Home / Self Care | Attending: Emergency Medicine | Admitting: Emergency Medicine

## 2024-10-30 DIAGNOSIS — Z20828 Contact with and (suspected) exposure to other viral communicable diseases: Secondary | ICD-10-CM

## 2024-10-30 DIAGNOSIS — J4521 Mild intermittent asthma with (acute) exacerbation: Secondary | ICD-10-CM | POA: Diagnosis not present

## 2024-10-30 MED ORDER — PREDNISONE 10 MG (21) PO TBPK: ORAL_TABLET | Freq: Every day | ORAL | 0 refills | Status: DC

## 2024-10-30 NOTE — ED Triage Notes (Signed)
 Patient reports cough and sore throat x 5 days. Patient reports daughter was diagnosed with Flu  A today. Patient has taken mucinex  -DM, Nighttime and daytime cough medication with mild relief.

## 2024-10-30 NOTE — ED Provider Notes (Signed)
 " [name]    CSN: [id number] Arrival date & time: 10/30/24  1330      History   Chief Complaint Chief Complaint  Patient presents with   Cough   Sore Throat    HPI Ashley Gross is a 73 y.o. female.   Patient presents for evaluation of fever peaking at 100.8, nasal congestion, rhinorrhea, nonproductive cough, shortness of breath with coughing and wheezing beginning 5 days ago.  Has heard a rattle to the chest as well.  Associated scratchy throat but denies presence of pain.  Decreased appetite but tolerable to food and liquids.  Family member says tested positive for influenza today.  Has been using albuterol  inhaler and nebulizer without relief.  History of asthma.     Past Medical History:  Diagnosis Date   Abnormal liver function test 10/06/2012   Allergy 1986   Anemia 01/25/2022   Anemia, unspecified    Asthma 1989   B12 deficiency 02/11/2019   Bleeding internal hemorrhoids 09/13/2017   Cataract 2009   Choledocholithiasis 06/29/2012   Formatting of this note might be different from the original. Dilated CBD on abdominal imaging 04/2012   Chronic diarrhea 02/16/2013   Clotting disorder 2019   Colitis    Complication of anesthesia    asthma attack after shoulder surgery   COPD (chronic obstructive pulmonary disease) (HCC) 2016   Diabetes mellitus (HCC)    type II   Esophagitis    GERD (gastroesophageal reflux disease)    Heart murmur 1952   for years- nothing to be concerned about.   History of kidney stones    Hx of arteriovenous malformation (AVM)    Hypertension    IBS (irritable bowel syndrome)    Loss of weight    Neuromuscular disorder (HCC) 1988   Peripheral vascular disease    Pernicious anemia    Personal history of colonic polyps 02/10/2013   02/08/13 colonoscopy - question of rectal varices, two 3-59mm polyps in the sigmoid colon, mass at the hepatic flexure, one 5mm polyp at the hepatic flexure, nodule at the ileocecal valve, congested  mucusa in the entire colon, flattened villi mucosa in the terminal ileum.     Pneumonia    Psoriatic arthritis (HCC)    s/p penicillamine, plaquenil, MTX, sulfasalazine.  s/p Embrel, Humira,.  Iritis.     Pure hypercholesterolemia    Rectal bleeding 07/15/2017   Sleep apnea 04/2023   Stroke (HCC) 2023   per patient    Patient Active Problem List   Diagnosis Date Noted   Moderately severe major depression (HCC) 10/29/2024   Diarrhea 02/06/2024   Hypokalemia 12/18/2023   Fever 12/03/2023   Sore throat 12/03/2023   Murmur 09/12/2023   Back pain 09/12/2023   Dizziness 07/19/2023   Allergic rhinitis 07/19/2023   Iron deficiency anemia 06/07/2023   Severe obstructive sleep apnea 05/10/2023   Insomnia 05/10/2023   Daytime somnolence 03/30/2023   Perianal excoriation 02/26/2023   Asthma exacerbation 02/20/2023   Cough 02/15/2023   Urinary frequency 12/28/2022   Aneurysm 11/28/2022   Type 2 diabetes mellitus with diabetic nephropathy, without long-term current use of insulin  (HCC) 11/28/2022   History of CVA (cerebrovascular accident) 05/30/2022   Anemia 01/25/2022   Left inguinal hernia    Meningioma (HCC) 11/05/2021   Inguinal hernia 10/31/2021   Pituitary lesion 10/31/2021   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   Ear fullness 05/25/2021   Skin lesion 03/15/2021  Sleep difficulties 06/08/2020   Swelling of lower extremity 11/26/2019   Healthcare maintenance 09/13/2019   Sinus congestion 02/11/2019   Asthma 12/28/2018   Vitamin D  deficiency 11/01/2018   Tendinitis of upper biceps tendon of left shoulder 09/20/2018   Depression, major, single episode, mild 09/15/2018   Mild intermittent asthma without complication 09/15/2018   Type 2 diabetes mellitus with hyperglycemia (HCC) 09/15/2018   Depression 09/15/2018   Controlled type 2 diabetes mellitus with complication, with long-term current use of insulin  (HCC) 09/15/2018    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   Traumatic incomplete tear of left rotator cuff 09/09/2018   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   Low back pain 03/29/2016   Acute sinusitis 12/19/2015   Stress 12/12/2014   Inflammatory polyps of colon (HCC) 02/16/2013   History of colonic polyps 02/10/2013   Hypertension 10/06/2012   GERD (gastroesophageal reflux disease) 10/06/2012   Hypercholesterolemia 10/06/2012   Elevated alkaline phosphatase level 06/29/2012   Psoriatic arthritis (HCC) 06/29/2012    Past Surgical History:  Procedure Laterality Date   ABDOMINAL HYSTERECTOMY  1981   ovaries left in place   ANKLE SURGERY     right   BRAIN SURGERY  November 2023   BUNIONECTOMY     CHOLECYSTECTOMY  1989   COLONOSCOPY WITH PROPOFOL  N/A 12/27/2017   Procedure: COLONOSCOPY WITH PROPOFOL ;  Surgeon: Unk Corinn Skiff, MD;  Location: [facility] ENDOSCOPY;  Service: Gastroenterology;  Laterality: N/A;   COLONOSCOPY WITH PROPOFOL  N/A 01/04/2019   Procedure: COLONOSCOPY WITH PROPOFOL ;  Surgeon: Unk Corinn Skiff, MD;  Location: [facility] SURGERY [facility]TR;  Service: Endoscopy;  Laterality: N/A;  Diabetic - oral and injectable   COLONOSCOPY WITH PROPOFOL  N/A 07/24/2021   Procedure: COLONOSCOPY WITH PROPOFOL ;  Surgeon: Unk Corinn Skiff, MD;  Location: [facility] SURGERY CNTR;  Service: Endoscopy;  Laterality: N/A;  Diabetic   ESOPHAGOGASTRODUODENOSCOPY (EGD) WITH PROPOFOL  N/A 12/27/2017   Procedure: ESOPHAGOGASTRODUODENOSCOPY (EGD) WITH PROPOFOL ;  Surgeon: Unk Corinn Skiff, MD;  Location: [facility] ENDOSCOPY;  Service: Gastroenterology;  Laterality: N/A;   EYE SURGERY  2011 2019   HAND SURGERY     right   HEEL SPUR SURGERY     HERNIA REPAIR  1977& 2023   IR 3D INDEPENDENT WKST  10/01/2021   IR ANGIO INTRA EXTRACRAN SEL COM CAROTID INNOMINATE UNI L MOD SED  10/01/2021   IR ANGIO INTRA EXTRACRAN SEL INTERNAL CAROTID UNI R MOD SED  10/01/2021   IR ANGIO INTRA EXTRACRAN SEL INTERNAL CAROTID UNI R MOD  SED  05/29/2022   IR ANGIO VERTEBRAL SEL VERTEBRAL BILAT MOD SED  10/01/2021   IR ANGIO VERTEBRAL SEL VERTEBRAL UNI R MOD SED  05/29/2022   IR CT HEAD LTD  10/01/2021   IR TRANSCATH/EMBOLIZ  10/01/2021   IR US  GUIDE VASC ACCESS RIGHT  10/01/2021   IR US  GUIDE VASC ACCESS RIGHT  05/29/2022   NOSE SURGERY     x2   POLYPECTOMY  01/04/2019   Procedure: POLYPECTOMY;  Surgeon: Unk Corinn Skiff, MD;  Location: [facility] SURGERY [facility]TR;  Service: Endoscopy;;   POLYPECTOMY N/A 07/24/2021   Procedure: POLYPECTOMY;  Surgeon: Unk Corinn Skiff, MD;  Location: [facility] SURGERY [facility];  Service: Endoscopy;  Laterality: N/A;   RADIOLOGY WITH ANESTHESIA N/A 10/01/2021   Procedure: HENDERSON WITH ANESTHESIA;  Surgeon: de Macedo Rodrigues, Katyucia, MD;  Location: [facility] OR;  Service: Radiology;  Laterality: N/A;   SHOULDER ARTHROSCOPY WITH OPEN ROTATOR CUFF REPAIR Left 09/20/2018   Procedure: SHOULDER ARTHROSCOPY WITH OPEN ROTATOR CUFF REPAIR;  Surgeon: Edie Norleen PARAS, MD;  Location: [facility] ORS;  Service: Orthopedics;  Laterality: Left;   SHOULDER CLOSED REDUCTION Left 12/21/2018   Procedure: MANIPULATION UNDER ANESTHESIA WITH STEROID INJECTION;  Surgeon: Edie Norleen PARAS, MD;  Location: [facility];  Service: Orthopedics;  Laterality: Left;  Diabetic - insulin  and oral meds   UMBILICAL HERNIA REPAIR     XI ROBOTIC ASSISTED INGUINAL HERNIA REPAIR WITH MESH Left 12/16/2021   Procedure: XI ROBOTIC ASSISTED INGUINAL HERNIA REPAIR WITH MESH;  Surgeon: Desiderio Schanz, MD;  Location: [facility] ORS;  Service: General;  Laterality: Left;    OB History   No obstetric history on file.      Home Medications    Prior to Admission medications  Medication Sig Start Date End Date Taking? Authorizing Provider  acetaminophen  (TYLENOL ) 500 MG tablet Take 2 tablets (1,000 mg total) by mouth every 6 (six) hours as needed for mild pain. 12/16/21   Desiderio Schanz, MD  albuterol  (VENTOLIN  HFA) 108 (90 Base) MCG/ACT inhaler Inhale  2 puffs into the lungs every 6 (six) hours as needed for wheezing or shortness of breath.    [provider]  amLODipine  (NORVASC ) 5 MG tablet Take 1 tablet (5 mg total) by mouth 2 (two) times daily. 10/07/24   Glendia Shad, MD  Azelastine  HCl 137 MCG/SPRAY SOLN PLACE 2 SPRAYS INTO BOTH NOSTRILS 2 (TWO) TIMES DAILY. USE IN EACH NOSTRIL AS DIRECTED 11/22/23   Maribeth Camellia MATSU, MD  benzonatate  (TESSALON ) 100 MG capsule Take 1 capsule (100 mg total) by mouth every 8 (eight) hours. 08/25/24   Teresa Shelba SAUNDERS, NP  Cholecalciferol  (VITAMIN D ) 50 MCG (2000 UT) tablet Take 2,000 Units by mouth daily.    [provider]  CREON  36000-114000 units CPEP capsule Take 1-2 capsules (36,000-72,000 Units total) by mouth See admin instructions. Take 720000 units with each meal and 36000 units with snacks 07/15/22   Vanga, Rohini Reddy, MD  escitalopram  (LEXAPRO ) 5 MG tablet Take 1 tablet (5 mg total) by mouth daily. 10/20/24   Glendia Shad, MD  fluticasone -salmeterol (WIXELA INHUB) 100-50 MCG/ACT AEPB INHALE 1 PUFF INTO THE LUNGS TWICE A DAY 08/11/24   Glendia Shad, MD  hydrALAZINE  (APRESOLINE ) 10 MG tablet Take 1 tablet (10 mg total) by mouth 3 (three) times daily. 10/20/24   Glendia Shad, MD  insulin  degludec (TRESIBA  FLEXTOUCH) 100 UNIT/ML FlexTouch Pen Inject 16 Units into the skin daily. 04/05/23   Scott, Charlene, MD  ipratropium (ATROVENT ) 0.03 % nasal spray Place 2 sprays into both nostrils every 12 (twelve) hours. 08/25/24   Cassady Turano, Shelba SAUNDERS, NP  loperamide  (IMODIUM ) 2 MG capsule Take 4 mg by mouth daily.    [provider]  losartan  (COZAAR ) 100 MG tablet TAKE 1 TABLET(100 MG) BY MOUTH AT BEDTIME 01/03/24   Glendia Shad, MD  mometasone  (ELOCON ) 0.1 % ointment Apply 1 Application topically 2 (two) times daily. 03/16/23   [provider]  omeprazole  (PRILOSEC) 20 MG capsule TAKE 1 CAPSULE(20 MG) BY MOUTH TWICE DAILY 10/06/24   Glendia Shad, MD  ONE TOUCH ULTRA  TEST test strip PATIENT NEEDS NEW METER STRIPS AND LANCETS FOR ONE TOUCH. HER INSURANCE NO LONGER COVERS ACCUCHEK. 07/31/15   Glendia Shad, MD  pantoprazole  (PROTONIX ) 40 MG tablet Take 1 tablet (40 mg total) by mouth 2 (two) times daily before a meal. 10/07/24 12/06/24  Ward, Josette SAILOR, DO  potassium chloride  (KLOR-CON  M) 10 MEQ tablet TAKE 1 TABLET BY MOUTH EVERY DAY 03/14/24  Glendia Shad, MD  rosuvastatin  (CRESTOR ) 10 MG tablet Take 1 tablet (10 mg total) by mouth daily. 04/11/24   Glendia Shad, MD  vedolizumab  (ENTYVIO ) 300 MG injection Inject 300 mg into the vein 4 (four) times a week. 12/23/23   Unk Corinn Skiff, MD  zolpidem  (AMBIEN  CR) 12.5 MG CR tablet Take 1 tablet (12.5 mg total) by mouth at bedtime as needed for sleep. 09/14/24   Neda Jennet LABOR, MD    Family History Family History  Problem Relation Age of Onset   Diabetes Mother    Hypertension Mother    Arthritis Father    Diabetes Father    Heart disease Father    Hypertension Father    Diabetes Other    Hypertension Other    ADD / ADHD Daughter    Asthma Daughter    ADD / ADHD Daughter    Asthma Daughter    Breast cancer Neg Hx    Colon cancer Neg Hx     Social History Social History[1]   Allergies   Aspirin , Beta adrenergic blockers, Cobalamin combinations, Lisinopril , Mtx support [cobalamine combinations], Nsaids, Vasotec [enalapril], Verapamil , Voltaren [diclofenac sodium], Erythromycin , Indocin [indomethacin], and Jardiance  [empagliflozin ]   Review of Systems Review of Systems  Constitutional:  Positive for fever. Negative for activity change, appetite change, chills, diaphoresis, fatigue and unexpected weight change.  HENT:  Positive for rhinorrhea. Negative for congestion, dental problem, drooling, ear discharge, ear pain, facial swelling, hearing loss, mouth sores, nosebleeds, postnasal drip, sinus pressure, sinus pain, sneezing, sore throat, tinnitus, trouble swallowing and voice change.    Respiratory:  Positive for cough, shortness of breath and wheezing. Negative for apnea, choking, chest tightness and stridor.      Physical Exam Triage Vital Signs ED Triage Vitals  Encounter Vitals Group     BP 10/30/24 1717 139/85     Girls Systolic BP Percentile --      Girls Diastolic BP Percentile --      Boys Systolic BP Percentile --      Boys Diastolic BP Percentile --      Pulse Rate 10/30/24 1717 75     Resp 10/30/24 1717 17     Temp 10/30/24 1717 98.7 F (37.1 C)     Temp Source 10/30/24 1717 Oral     SpO2 10/30/24 1717 95 %     Weight --      Height --      Head Circumference --      Peak Flow --      Pain Score 10/30/24 1715 7     Pain Loc --      Pain Education --      Exclude from Growth Chart --    No data found.  Updated Vital Signs BP 139/85 (BP Location: Right Arm)   Pulse 75   Temp 98.7 F (37.1 C) (Oral)   Resp 17   SpO2 95%   Visual Acuity Right Eye Distance:   Left Eye Distance:   Bilateral Distance:    Right Eye Near:   Left Eye Near:    Bilateral Near:     Physical Exam Constitutional:      Appearance: Normal appearance.  HENT:     Head: Normocephalic.     Right Ear: Tympanic membrane, ear canal and external ear normal.     Left Ear: Tympanic membrane, ear canal and external ear normal.     Nose: Congestion present.     Mouth/Throat:  Pharynx: No oropharyngeal exudate or posterior oropharyngeal erythema.  Eyes:     Extraocular Movements: Extraocular movements intact.  Cardiovascular:     Rate and Rhythm: Normal rate and regular rhythm.     Pulses: Normal pulses.     Heart sounds: Normal heart sounds.  Pulmonary:     Effort: Pulmonary effort is normal.     Breath sounds: Wheezing present.  Neurological:     Mental Status: She is alert and oriented to person, place, and time.      UC Treatments / Results  Labs (all labs ordered are listed, but only abnormal results are displayed) Labs Reviewed - No data to  display  EKG   Radiology No results found.  Procedures Procedures (including critical care time)  Medications Ordered in UC Medications - No data to display  Initial Impression / Assessment and Plan / UC Course  I have reviewed the triage vital signs and the nursing notes.  Pertinent labs & imaging results that were available during my care of the patient were reviewed by me and considered in my medical decision making (see chart for details).  Exposure to the flu, mild intermittent asthma with acute exacerbation  Patient is in no signs of distress nor toxic appearing.  Vital signs are stable.  Low suspicion for pneumonia, pneumothorax or bronchitis and therefore will defer imaging.  Viral testing deferred due to timeline of illness, past window for Tamiflu  therefore deferred usage, etiology most likely viral as other household members are ill, prescribed prednisone  for management. May use additional over-the-counter medications as needed for supportive care.  May follow-up with urgent care as needed if symptoms persist or worsen.   Final Clinical Impressions(s) / UC Diagnoses   Final diagnoses:  None   Discharge Instructions   None    ED Prescriptions   None    PDMP not reviewed this encounter.     [1]  Social History Tobacco Use   Smoking status: Never    Passive exposure: Never   Smokeless tobacco: Never  Vaping Use   Vaping status: Never Used  Substance Use Topics   Alcohol use: Never   Drug use: Never     Teresa Shelba SAUNDERS, NP 10/30/24 1756  "

## 2024-10-30 NOTE — Discharge Instructions (Signed)
 They are most likely being caused by influenza A/B, your symptoms are consistent with the current presentation, influenza is a virus and will steadily improve with time  Unfortunately as you have been ill for 5 days we are unable to use Tamiflu  however despite this she should get better with time  Begin prednisone  every morning with food as directed to reduce inflammation and help open and relax the airway  Continue use of inhalers    You can take Tylenol  and/or Ibuprofen as needed for fever reduction and pain relief.   For cough: honey 1/2 to 1 teaspoon (you can dilute the honey in water  or another fluid).  You can also use guaifenesin  and dextromethorphan for cough. You can use a humidifier for chest congestion and cough.  If you don't have a humidifier, you can sit in the bathroom with the hot shower running.      For sore throat: try warm salt water  gargles, cepacol lozenges, throat spray, warm tea or water  with lemon/honey, popsicles or ice, or OTC cold relief medicine for throat discomfort.   For congestion: take a daily anti-histamine like Zyrtec, Claritin, and a oral decongestant, such as pseudoephedrine.  You can also use Flonase  1-2 sprays in each nostril daily.   It is important to stay hydrated: drink plenty of fluids (water , gatorade/powerade/pedialyte, juices, or teas) to keep your throat moisturized and help further relieve irritation/discomfort.

## 2024-10-31 ENCOUNTER — Ambulatory Visit: Payer: Self-pay

## 2024-11-11 ENCOUNTER — Other Ambulatory Visit: Payer: Self-pay | Admitting: Internal Medicine

## 2024-11-12 ENCOUNTER — Other Ambulatory Visit: Payer: Self-pay

## 2024-11-12 ENCOUNTER — Emergency Department

## 2024-11-12 ENCOUNTER — Inpatient Hospital Stay
Admission: EM | Admit: 2024-11-12 | Discharge: 2024-11-22 | DRG: 194 | Disposition: A | Discharge status: Skilled Nursing Facility | Attending: Internal Medicine | Admitting: Internal Medicine

## 2024-11-12 DIAGNOSIS — Z794 Long term (current) use of insulin: Secondary | ICD-10-CM

## 2024-11-12 DIAGNOSIS — Z8673 Personal history of transient ischemic attack (TIA), and cerebral infarction without residual deficits: Secondary | ICD-10-CM

## 2024-11-12 DIAGNOSIS — J440 Chronic obstructive pulmonary disease with acute lower respiratory infection: Secondary | ICD-10-CM | POA: Diagnosis present

## 2024-11-12 DIAGNOSIS — E119 Type 2 diabetes mellitus without complications: Secondary | ICD-10-CM

## 2024-11-12 DIAGNOSIS — K8681 Exocrine pancreatic insufficiency: Secondary | ICD-10-CM

## 2024-11-12 DIAGNOSIS — Z881 Allergy status to other antibiotic agents status: Secondary | ICD-10-CM

## 2024-11-12 DIAGNOSIS — J111 Influenza due to unidentified influenza virus with other respiratory manifestations: Secondary | ICD-10-CM

## 2024-11-12 DIAGNOSIS — Z8261 Family history of arthritis: Secondary | ICD-10-CM | POA: Diagnosis not present

## 2024-11-12 DIAGNOSIS — Z860100 Personal history of colon polyps, unspecified: Secondary | ICD-10-CM

## 2024-11-12 DIAGNOSIS — J1000 Influenza due to other identified influenza virus with unspecified type of pneumonia: Secondary | ICD-10-CM

## 2024-11-12 DIAGNOSIS — Z825 Family history of asthma and other chronic lower respiratory diseases: Secondary | ICD-10-CM | POA: Diagnosis not present

## 2024-11-12 DIAGNOSIS — E7800 Pure hypercholesterolemia, unspecified: Secondary | ICD-10-CM | POA: Diagnosis present

## 2024-11-12 DIAGNOSIS — Z90710 Acquired absence of both cervix and uterus: Secondary | ICD-10-CM

## 2024-11-12 DIAGNOSIS — A419 Sepsis, unspecified organism: Secondary | ICD-10-CM | POA: Insufficient documentation

## 2024-11-12 DIAGNOSIS — Z833 Family history of diabetes mellitus: Secondary | ICD-10-CM | POA: Diagnosis not present

## 2024-11-12 DIAGNOSIS — Z87442 Personal history of urinary calculi: Secondary | ICD-10-CM

## 2024-11-12 DIAGNOSIS — G8929 Other chronic pain: Secondary | ICD-10-CM | POA: Diagnosis present

## 2024-11-12 DIAGNOSIS — J159 Unspecified bacterial pneumonia: Secondary | ICD-10-CM | POA: Diagnosis present

## 2024-11-12 DIAGNOSIS — Z9049 Acquired absence of other specified parts of digestive tract: Secondary | ICD-10-CM

## 2024-11-12 DIAGNOSIS — Z888 Allergy status to other drugs, medicaments and biological substances status: Secondary | ICD-10-CM | POA: Diagnosis not present

## 2024-11-12 DIAGNOSIS — R5381 Other malaise: Secondary | ICD-10-CM | POA: Diagnosis present

## 2024-11-12 DIAGNOSIS — D649 Anemia, unspecified: Secondary | ICD-10-CM | POA: Diagnosis present

## 2024-11-12 DIAGNOSIS — N179 Acute kidney failure, unspecified: Secondary | ICD-10-CM | POA: Diagnosis present

## 2024-11-12 DIAGNOSIS — R0781 Pleurodynia: Secondary | ICD-10-CM

## 2024-11-12 DIAGNOSIS — E1165 Type 2 diabetes mellitus with hyperglycemia: Secondary | ICD-10-CM | POA: Diagnosis present

## 2024-11-12 DIAGNOSIS — Z79899 Other long term (current) drug therapy: Secondary | ICD-10-CM

## 2024-11-12 DIAGNOSIS — E876 Hypokalemia: Secondary | ICD-10-CM | POA: Diagnosis present

## 2024-11-12 DIAGNOSIS — J1008 Influenza due to other identified influenza virus with other specified pneumonia: Secondary | ICD-10-CM | POA: Diagnosis present

## 2024-11-12 DIAGNOSIS — K219 Gastro-esophageal reflux disease without esophagitis: Secondary | ICD-10-CM | POA: Diagnosis present

## 2024-11-12 DIAGNOSIS — E1151 Type 2 diabetes mellitus with diabetic peripheral angiopathy without gangrene: Secondary | ICD-10-CM | POA: Diagnosis present

## 2024-11-12 DIAGNOSIS — J189 Pneumonia, unspecified organism: Principal | ICD-10-CM

## 2024-11-12 DIAGNOSIS — Z8249 Family history of ischemic heart disease and other diseases of the circulatory system: Secondary | ICD-10-CM | POA: Diagnosis not present

## 2024-11-12 DIAGNOSIS — I10 Essential (primary) hypertension: Secondary | ICD-10-CM | POA: Diagnosis present

## 2024-11-12 DIAGNOSIS — E1121 Type 2 diabetes mellitus with diabetic nephropathy: Secondary | ICD-10-CM

## 2024-11-12 LAB — RESP PANEL BY RT-PCR (RSV, FLU A&B, COVID)  RVPGX2
Influenza A by PCR: POSITIVE — AB
Influenza B by PCR: NEGATIVE
Resp Syncytial Virus by PCR: NEGATIVE
SARS Coronavirus 2 by RT PCR: NEGATIVE

## 2024-11-12 LAB — PRO BRAIN NATRIURETIC PEPTIDE: Pro Brain Natriuretic Peptide: 1157 pg/mL — ABNORMAL HIGH

## 2024-11-12 LAB — BASIC METABOLIC PANEL WITH GFR
Anion gap: 14 (ref 5–15)
BUN: 25 mg/dL — ABNORMAL HIGH (ref 8–23)
CO2: 26 mmol/L (ref 22–32)
Calcium: 8.6 mg/dL — ABNORMAL LOW (ref 8.9–10.3)
Chloride: 93 mmol/L — ABNORMAL LOW (ref 98–111)
Creatinine, Ser: 1.57 mg/dL — ABNORMAL HIGH (ref 0.44–1.00)
GFR, Estimated: 34 mL/min — ABNORMAL LOW
Glucose, Bld: 211 mg/dL — ABNORMAL HIGH (ref 70–99)
Potassium: 3.4 mmol/L — ABNORMAL LOW (ref 3.5–5.1)
Sodium: 133 mmol/L — ABNORMAL LOW (ref 135–145)

## 2024-11-12 LAB — PROCALCITONIN: Procalcitonin: 100 ng/mL

## 2024-11-12 LAB — CBC
HCT: 37.2 % (ref 36.0–46.0)
Hemoglobin: 12.3 g/dL (ref 12.0–15.0)
MCH: 28.9 pg (ref 26.0–34.0)
MCHC: 33.1 g/dL (ref 30.0–36.0)
MCV: 87.5 fL (ref 80.0–100.0)
Platelets: 282 K/uL (ref 150–400)
RBC: 4.25 MIL/uL (ref 3.87–5.11)
RDW: 15.9 % — ABNORMAL HIGH (ref 11.5–15.5)
WBC: 22.7 K/uL — ABNORMAL HIGH (ref 4.0–10.5)
nRBC: 0 % (ref 0.0–0.2)

## 2024-11-12 LAB — TROPONIN T, HIGH SENSITIVITY: Troponin T High Sensitivity: 28 ng/L — ABNORMAL HIGH (ref 0–19)

## 2024-11-12 LAB — CBG MONITORING, ED
Glucose-Capillary: 164 mg/dL — ABNORMAL HIGH (ref 70–99)
Glucose-Capillary: 98 mg/dL (ref 70–99)

## 2024-11-12 LAB — LACTIC ACID, PLASMA: Lactic Acid, Venous: 1.5 mmol/L (ref 0.5–1.9)

## 2024-11-12 LAB — MAGNESIUM: Magnesium: 1.9 mg/dL (ref 1.7–2.4)

## 2024-11-12 MED ORDER — ROSUVASTATIN CALCIUM 10 MG PO TABS
10.0000 mg | ORAL_TABLET | Freq: Every day | ORAL | Status: DC
  Administered 2024-11-13 – 2024-11-22 (×10): 10 mg via ORAL
  Filled 2024-11-12 (×10): qty 1

## 2024-11-12 MED ORDER — SODIUM CHLORIDE 0.9 % IV SOLN: 2.0000 g | INTRAVENOUS | Status: DC

## 2024-11-12 MED ORDER — ACETAMINOPHEN 650 MG RE SUPP: 650.0000 mg | Freq: Four times a day (QID) | RECTAL | Status: DC | PRN

## 2024-11-12 MED ORDER — SODIUM CHLORIDE 0.9 % IV SOLN: 500.0000 mg | Freq: Once | INTRAVENOUS | Status: DC

## 2024-11-12 MED ORDER — HYDRALAZINE HCL 10 MG PO TABS
10.0000 mg | ORAL_TABLET | Freq: Three times a day (TID) | ORAL | Status: DC
  Administered 2024-11-12 – 2024-11-22 (×29): 10 mg via ORAL
  Filled 2024-11-12 (×29): qty 1

## 2024-11-12 MED ORDER — ALBUTEROL SULFATE (2.5 MG/3ML) 0.083% IN NEBU
3.0000 mL | INHALATION_SOLUTION | Freq: Four times a day (QID) | RESPIRATORY_TRACT | Status: DC | PRN
  Administered 2024-11-17 – 2024-11-20 (×2): 3 mL via RESPIRATORY_TRACT
  Filled 2024-11-12 (×4): qty 3

## 2024-11-12 MED ORDER — ONDANSETRON HCL 4 MG/2ML IJ SOLN
4.0000 mg | Freq: Four times a day (QID) | INTRAMUSCULAR | Status: DC | PRN
  Administered 2024-11-12: 4 mg via INTRAVENOUS
  Filled 2024-11-12: qty 2

## 2024-11-12 MED ORDER — SODIUM CHLORIDE 0.9 % IV SOLN
100.0000 mg | Freq: Two times a day (BID) | INTRAVENOUS | Status: DC
  Administered 2024-11-12 – 2024-11-14 (×4): 100 mg via INTRAVENOUS
  Filled 2024-11-12 (×5): qty 100

## 2024-11-12 MED ORDER — ESCITALOPRAM OXALATE 10 MG PO TABS
5.0000 mg | ORAL_TABLET | Freq: Every day | ORAL | Status: DC
  Administered 2024-11-13 – 2024-11-22 (×10): 5 mg via ORAL
  Filled 2024-11-12 (×11): qty 1

## 2024-11-12 MED ORDER — SODIUM CHLORIDE 0.9 % IV SOLN: INTRAVENOUS | Status: AC

## 2024-11-12 MED ORDER — INSULIN GLARGINE 100 UNIT/ML ~~LOC~~ SOLN
10.0000 [IU] | Freq: Every day | SUBCUTANEOUS | Status: DC
  Administered 2024-11-12 – 2024-11-14 (×3): 10 [IU] via SUBCUTANEOUS
  Filled 2024-11-12 (×3): qty 0.1

## 2024-11-12 MED ORDER — ONDANSETRON HCL 4 MG PO TABS: 4.0000 mg | ORAL_TABLET | Freq: Four times a day (QID) | ORAL | Status: DC | PRN

## 2024-11-12 MED ORDER — GUAIFENESIN-CODEINE 100-10 MG/5ML PO SOLN
5.0000 mL | Freq: Once | ORAL | Status: AC
  Administered 2024-11-12: 5 mL via ORAL
  Filled 2024-11-12: qty 5

## 2024-11-12 MED ORDER — POTASSIUM CHLORIDE CRYS ER 20 MEQ PO TBCR
40.0000 meq | EXTENDED_RELEASE_TABLET | Freq: Once | ORAL | Status: AC
  Administered 2024-11-12: 40 meq via ORAL
  Filled 2024-11-12: qty 2

## 2024-11-12 MED ORDER — IPRATROPIUM BROMIDE 0.03 % NA SOLN: 2.0000 | Freq: Two times a day (BID) | NASAL | Status: DC | PRN

## 2024-11-12 MED ORDER — RISAQUAD PO CAPS
2.0000 | ORAL_CAPSULE | Freq: Three times a day (TID) | ORAL | Status: DC
  Administered 2024-11-12 – 2024-11-22 (×29): 2 via ORAL
  Filled 2024-11-12 (×30): qty 2

## 2024-11-12 MED ORDER — LIDOCAINE 5 % EX PTCH
1.0000 | MEDICATED_PATCH | CUTANEOUS | Status: DC
  Administered 2024-11-12 – 2024-11-21 (×10): 1 via TRANSDERMAL
  Filled 2024-11-12 (×10): qty 1

## 2024-11-12 MED ORDER — PANTOPRAZOLE SODIUM 40 MG PO TBEC
40.0000 mg | DELAYED_RELEASE_TABLET | Freq: Every day | ORAL | Status: DC
  Administered 2024-11-12 – 2024-11-22 (×11): 40 mg via ORAL
  Filled 2024-11-12 (×11): qty 1

## 2024-11-12 MED ORDER — FLUTICASONE FUROATE-VILANTEROL 100-25 MCG/ACT IN AEPB
1.0000 | INHALATION_SPRAY | Freq: Every day | RESPIRATORY_TRACT | Status: DC
  Administered 2024-11-13 – 2024-11-22 (×10): 1 via RESPIRATORY_TRACT
  Filled 2024-11-12: qty 28

## 2024-11-12 MED ORDER — SODIUM CHLORIDE 0.9 % IV SOLN
1.0000 g | Freq: Once | INTRAVENOUS | Status: AC
  Administered 2024-11-12: 1 g via INTRAVENOUS
  Filled 2024-11-12: qty 10

## 2024-11-12 MED ORDER — PANCRELIPASE (LIP-PROT-AMYL) 36000-114000 UNITS PO CPEP: 36000.0000 [IU] | ORAL_CAPSULE | ORAL | Status: DC

## 2024-11-12 MED ORDER — HYDROMORPHONE HCL 1 MG/ML IJ SOLN
0.5000 mg | INTRAMUSCULAR | Status: DC | PRN
  Administered 2024-11-12 – 2024-11-21 (×6): 0.5 mg via INTRAVENOUS
  Filled 2024-11-12 (×10): qty 0.5

## 2024-11-12 MED ORDER — HEPARIN SODIUM (PORCINE) 5000 UNIT/ML IJ SOLN
5000.0000 [IU] | Freq: Two times a day (BID) | INTRAMUSCULAR | Status: DC
  Administered 2024-11-12 – 2024-11-22 (×20): 5000 [IU] via SUBCUTANEOUS
  Filled 2024-11-12 (×21): qty 1

## 2024-11-12 MED ORDER — GUAIFENESIN ER 600 MG PO TB12
600.0000 mg | ORAL_TABLET | Freq: Two times a day (BID) | ORAL | Status: DC
  Administered 2024-11-12 – 2024-11-13 (×2): 600 mg via ORAL
  Filled 2024-11-12 (×3): qty 1

## 2024-11-12 MED ORDER — BENZONATATE 100 MG PO CAPS
100.0000 mg | ORAL_CAPSULE | Freq: Three times a day (TID) | ORAL | Status: DC | PRN
  Administered 2024-11-13 (×2): 100 mg via ORAL
  Filled 2024-11-12 (×2): qty 1

## 2024-11-12 MED ORDER — PIPERACILLIN-TAZOBACTAM 3.375 G IVPB
3.3750 g | Freq: Three times a day (TID) | INTRAVENOUS | Status: DC
  Administered 2024-11-13: 3.375 g via INTRAVENOUS
  Filled 2024-11-12: qty 50

## 2024-11-12 MED ORDER — ACETAMINOPHEN 325 MG PO TABS
650.0000 mg | ORAL_TABLET | Freq: Four times a day (QID) | ORAL | Status: DC | PRN
  Administered 2024-11-13 – 2024-11-19 (×7): 650 mg via ORAL
  Filled 2024-11-12 (×7): qty 2

## 2024-11-12 MED ORDER — IPRATROPIUM-ALBUTEROL 0.5-2.5 (3) MG/3ML IN SOLN
3.0000 mL | Freq: Four times a day (QID) | RESPIRATORY_TRACT | Status: DC
  Administered 2024-11-12 – 2024-11-14 (×6): 3 mL via RESPIRATORY_TRACT
  Filled 2024-11-12 (×7): qty 3

## 2024-11-12 MED ORDER — INSULIN ASPART 100 UNIT/ML IJ SOLN
0.0000 [IU] | Freq: Three times a day (TID) | INTRAMUSCULAR | Status: DC
  Administered 2024-11-12: 2 [IU] via SUBCUTANEOUS
  Administered 2024-11-13 – 2024-11-17 (×3): 1 [IU] via SUBCUTANEOUS
  Administered 2024-11-19: 2 [IU] via SUBCUTANEOUS
  Administered 2024-11-20 – 2024-11-21 (×2): 1 [IU] via SUBCUTANEOUS
  Filled 2024-11-12: qty 1
  Filled 2024-11-12: qty 2
  Filled 2024-11-12: qty 1
  Filled 2024-11-12: qty 2
  Filled 2024-11-12: qty 1
  Filled 2024-11-12: qty 2
  Filled 2024-11-12: qty 1

## 2024-11-12 MED ORDER — MORPHINE SULFATE (PF) 4 MG/ML IV SOLN
4.0000 mg | Freq: Once | INTRAVENOUS | Status: AC
  Administered 2024-11-12: 4 mg via INTRAVENOUS
  Filled 2024-11-12: qty 1

## 2024-11-12 MED ORDER — ZOLPIDEM TARTRATE 5 MG PO TABS
5.0000 mg | ORAL_TABLET | Freq: Every evening | ORAL | Status: DC | PRN
  Administered 2024-11-15 – 2024-11-21 (×7): 5 mg via ORAL
  Filled 2024-11-12 (×7): qty 1

## 2024-11-12 MED ORDER — SODIUM CHLORIDE 0.9 % IV BOLUS
500.0000 mL | Freq: Once | INTRAVENOUS | Status: AC
  Administered 2024-11-12: 500 mL via INTRAVENOUS

## 2024-11-12 MED ORDER — SODIUM CHLORIDE 0.9 % IV SOLN
100.0000 mg | Freq: Once | INTRAVENOUS | Status: AC
  Administered 2024-11-12: 100 mg via INTRAVENOUS
  Filled 2024-11-12: qty 100

## 2024-11-12 MED ORDER — SENNOSIDES-DOCUSATE SODIUM 8.6-50 MG PO TABS: 1.0000 | ORAL_TABLET | Freq: Every evening | ORAL | Status: DC | PRN

## 2024-11-12 MED ORDER — LOPERAMIDE HCL 2 MG PO CAPS
2.0000 mg | ORAL_CAPSULE | ORAL | Status: DC | PRN
  Administered 2024-11-14 – 2024-11-15 (×2): 2 mg via ORAL
  Filled 2024-11-12 (×2): qty 1

## 2024-11-12 NOTE — ED Provider Notes (Signed)
 "  [facility] Provider Note    Event Date/Time   First MD Initiated Contact with Patient 11/12/24 1140     (approximate)   History   Weakness   HPI  Ashley Gross is a 74 y.o. female presenting with concern of weakness and shortness of breath and chest discomfort.  Over 2 weeks of symptoms of increased weakness poor p.o. intake and increased shortness of breath.  Was seen at urgent care and diagnosed with the flu it seems that she was given a course of steroids at that time she also does have history of underlying COPD.  She completed the course of the steroids without much improvement in symptoms continues to have increased weakness and shortness of breath causing her to present at this time.  Patient having trouble recalling her prior medical history, has not noticed any increased swelling in her extremities.  Endorses chest pain with coughing primarily.       Physical Exam   Triage Vital Signs: ED Triage Vitals  Encounter Vitals Group     BP 11/12/24 1050 133/70     Girls Systolic BP Percentile --      Girls Diastolic BP Percentile --      Boys Systolic BP Percentile --      Boys Diastolic BP Percentile --      Pulse Rate 11/12/24 1050 98     Resp 11/12/24 1050 20     Temp 11/12/24 1050 98 F (36.7 C)     Temp Source 11/12/24 1050 Oral     SpO2 11/12/24 1050 96 %     Weight 11/12/24 1055 138 lb (62.6 kg)     Height 11/12/24 1055 5' 4 (1.626 m)     Head Circumference --      Peak Flow --      Pain Score 11/12/24 1055 10     Pain Loc --      Pain Education --      Exclude from Growth Chart --     Most recent vital signs: Vitals:   11/12/24 1050 11/12/24 1120  BP: 133/70   Pulse: 98   Resp: 20   Temp: 98 F (36.7 C)   SpO2: 96% 98%     General: Awake, appears uncomfortable CV:  Good peripheral perfusion.  No extremity swelling noted Resp:  Normal effort.  Speaks in short sentences, tachypneic Abd:  No distention.  Soft  nontender Other:     ED Results / Procedures / Treatments   Labs (all labs ordered are listed, but only abnormal results are displayed) Labs Reviewed  RESP PANEL BY RT-PCR (RSV, FLU A&B, COVID)  RVPGX2 - Abnormal; Notable for the following components:      Result Value   Influenza A by PCR POSITIVE (*)    All other components within normal limits  CBC - Abnormal; Notable for the following components:   WBC 22.7 (*)    RDW 15.9 (*)    All other components within normal limits  BASIC METABOLIC PANEL WITH GFR - Abnormal; Notable for the following components:   Sodium 133 (*)    Potassium 3.4 (*)    Chloride 93 (*)    Glucose, Bld 211 (*)    BUN 25 (*)    Creatinine, Ser 1.57 (*)    Calcium  8.6 (*)    GFR, Estimated 34 (*)    All other components within normal limits  PRO BRAIN NATRIURETIC PEPTIDE - Abnormal; Notable for  the following components:   Pro Brain Natriuretic Peptide 1,157.0 (*)    All other components within normal limits  TROPONIN T, HIGH SENSITIVITY - Abnormal; Notable for the following components:   Troponin T High Sensitivity 28 (*)    All other components within normal limits     EKG  My independent interpretation appears to be sinus rhythm with rate of about 100, axis of -10, intervals appear to be within normal limits, no obvious ischemia appreciated on this EKG   RADIOLOGY On my independent interpretation, does appear to have an infiltrate in the left lower lobe likely consistent with pneumonia  PROCEDURES:  Critical Care performed: No  Procedures   MEDICATIONS ORDERED IN ED: Medications  doxycycline  (VIBRAMYCIN ) 100 mg in sodium chloride  0.9 % 250 mL IVPB (100 mg Intravenous New Bag/Given 11/12/24 1416)  HYDROmorphone  (DILAUDID ) injection 0.5 mg (has no administration in time range)  benzonatate  (TESSALON ) capsule 100 mg (has no administration in time range)  guaiFENesin  (MUCINEX ) 12 hr tablet 600 mg (has no administration in time range)   cefTRIAXone  (ROCEPHIN ) 1 g in sodium chloride  0.9 % 100 mL IVPB (0 g Intravenous Stopped 11/12/24 1303)  guaiFENesin -codeine  100-10 MG/5ML solution 5 mL (5 mLs Oral Given 11/12/24 1259)  sodium chloride  0.9 % bolus 500 mL (500 mLs Intravenous New Bag/Given 11/12/24 1524)  morphine  (PF) 4 MG/ML injection 4 mg (4 mg Intravenous Given 11/12/24 1524)     IMPRESSION / MDM / ASSESSMENT AND PLAN / ED COURSE  I reviewed the triage vital signs and the nursing notes.                               Patient's presentation is most consistent with acute presentation with potential threat to life or bodily function.  74 year old female presents today with concern of worsening weakness in the setting of recent flu positive test.  She does appear to be tachypneic and very uncomfortable on exam room.  Found to be hypoxic by triage and EMS and was placed on 2 L nasal cannula does not usually use oxygen at baseline.  X-ray demonstrating evidence of developing pneumonia, she does have a significant leukocytosis however this may be attributed to her recent steroid therapy as well.  Regardless we will start antibiotics at this time and plan for admission to the medicine service.   Clinical Course as of 11/12/24 1527  [place] Nov 12, 2024  1526 Spoke with Dr. Laurita given her increased work of breathing plan for admission to medicine service. [SK]    Clinical Course User Index [SK] Fernand Rossie HERO, MD     FINAL CLINICAL IMPRESSION(S) / ED DIAGNOSES   Final diagnoses:  Pneumonia of left lower lobe due to infectious organism  Flu     Rx / DC Orders   ED Discharge Orders     None        Note:  This document was prepared using Dragon voice recognition software and may include unintentional dictation errors.   Fernand Rossie HERO, MD 11/12/24 1527  "

## 2024-11-12 NOTE — H&P (Signed)
 " History and Physical    Ashley Gross:[id number] DOB: 11-13-1950 DOA: 11/12/2024  PCP: Glendia Shad, MD (Confirm with patient/family/NH records and if not entered, this has to be entered at [facility] point of entry) Patient coming from: Home  I have personally briefly reviewed patient's old medical records in [facility] Link  Chief Complaint: Cough, left-sided chest pain  HPI: Ashley Gross is a 74 y.o. female with medical history significant of chronic pain diarrhea, chronic pancreatic insufficiency, IDDM, HTN, HLD, COPD, presented with worsening of cough shortness of breath and left-sided chest pain.  Symptoms started 1 week ago when patient had flulike symptoms including runny nose sore throat headache congestion and cough, last few days the cough has turned productive with yellowish sputum production however patient also developed severe sharp like left-sided chest pain worsening with cough and deep breath she has had episode chills but no fever.  Since yesterday she has also developed watery diarrhea 4-5 times a day but denied any abdominal plain no nausea vomiting.  ED Course: Afebrile, nontachycardic blood pressure 130/70 saturation 96% on 2 L.  Chest x-ray showed left lower lobe infiltrate/consolidation, bilateral extremities WBC 22, BUN 25 creatinine 1.5 compared to baseline 1.0 glucose 199.,  K3.4.  Patient was given doxycycline  and ceftriaxone  in the ED.  Review of Systems: As per HPI otherwise 14 point reiew of systems negative.   Past Medical History:  Diagnosis Date   Abnormal liver function test 10/06/2012   Allergy 1986   Anemia 01/25/2022   Anemia, unspecified    Asthma 1989   B12 deficiency 02/11/2019   Bleeding internal hemorrhoids 09/13/2017   Cataract 2009   Choledocholithiasis 06/29/2012   Formatting of this note might be different from the original. Dilated CBD on abdominal imaging 04/2012   Chronic diarrhea 02/16/2013   Clotting disorder 2019    Colitis    Complication of anesthesia    asthma attack after shoulder surgery   COPD (chronic obstructive pulmonary disease) (HCC) 2016   Diabetes mellitus (HCC)    type II   Esophagitis    GERD (gastroesophageal reflux disease)    Heart murmur 1952   for years- nothing to be concerned about.   History of kidney stones    Hx of arteriovenous malformation (AVM)    Hypertension    IBS (irritable bowel syndrome)    Loss of weight    Neuromuscular disorder (HCC) 1988   Peripheral vascular disease    Pernicious anemia    Personal history of colonic polyps 02/10/2013   02/08/13 colonoscopy - question of rectal varices, two 3-34mm polyps in the sigmoid colon, mass at the hepatic flexure, one 5mm polyp at the hepatic flexure, nodule at the ileocecal valve, congested mucusa in the entire colon, flattened villi mucosa in the terminal ileum.     Pneumonia    Psoriatic arthritis (HCC)    s/p penicillamine, plaquenil, MTX, sulfasalazine.  s/p Embrel, Humira,.  Iritis.     Pure hypercholesterolemia    Rectal bleeding 07/15/2017   Sleep apnea 04/2023   Stroke [facility]) 2023   per patient    Past Surgical History:  Procedure Laterality Date   ABDOMINAL HYSTERECTOMY  1981   ovaries left in place   ANKLE SURGERY     right   BRAIN SURGERY  November 2023   BUNIONECTOMY     CHOLECYSTECTOMY  1989   COLONOSCOPY WITH PROPOFOL  N/A 12/27/2017   Procedure: COLONOSCOPY WITH PROPOFOL ;  Surgeon: Unk Corinn Skiff, MD;  Location: [facility] ENDOSCOPY;  Service: Gastroenterology;  Laterality: N/A;   COLONOSCOPY WITH PROPOFOL  N/A 01/04/2019   Procedure: COLONOSCOPY WITH PROPOFOL ;  Surgeon: Unk Corinn Skiff, MD;  Location: [facility];  Service: Endoscopy;  Laterality: N/A;  Diabetic - oral and injectable   COLONOSCOPY WITH PROPOFOL  N/A 07/24/2021   Procedure: COLONOSCOPY WITH PROPOFOL ;  Surgeon: Unk Corinn Skiff, MD;  Location: [facility];  Service: Endoscopy;  Laterality: N/A;  Diabetic    ESOPHAGOGASTRODUODENOSCOPY (EGD) WITH PROPOFOL  N/A 12/27/2017   Procedure: ESOPHAGOGASTRODUODENOSCOPY (EGD) WITH PROPOFOL ;  Surgeon: Unk Corinn Skiff, MD;  Location: [facility] ENDOSCOPY;  Service: Gastroenterology;  Laterality: N/A;   EYE SURGERY  2011 2019   HAND SURGERY     right   HEEL SPUR SURGERY     HERNIA REPAIR  1977& 2023   IR 3D INDEPENDENT WKST  10/01/2021   IR ANGIO INTRA EXTRACRAN SEL COM CAROTID INNOMINATE UNI L MOD SED  10/01/2021   IR ANGIO INTRA EXTRACRAN SEL INTERNAL CAROTID UNI R MOD SED  10/01/2021   IR ANGIO INTRA EXTRACRAN SEL INTERNAL CAROTID UNI R MOD SED  05/29/2022   IR ANGIO VERTEBRAL SEL VERTEBRAL BILAT MOD SED  10/01/2021   IR ANGIO VERTEBRAL SEL VERTEBRAL UNI R MOD SED  05/29/2022   IR CT HEAD LTD  10/01/2021   IR TRANSCATH/EMBOLIZ  10/01/2021   IR US  GUIDE VASC ACCESS RIGHT  10/01/2021   IR US  GUIDE VASC ACCESS RIGHT  05/29/2022   NOSE SURGERY     x2   POLYPECTOMY  01/04/2019   Procedure: POLYPECTOMY;  Surgeon: Unk Corinn Skiff, MD;  Location: [facility];  Service: Endoscopy;;   POLYPECTOMY N/A 07/24/2021   Procedure: POLYPECTOMY;  Surgeon: Unk Corinn Skiff, MD;  Location: [facility];  Service: Endoscopy;  Laterality: N/A;   RADIOLOGY WITH ANESTHESIA N/A 10/01/2021   Procedure: HENDERSON WITH ANESTHESIA;  Surgeon: de Macedo Rodrigues, Katyucia, MD;  Location: [facility] OR;  Service: Radiology;  Laterality: N/A;   SHOULDER ARTHROSCOPY WITH OPEN ROTATOR CUFF REPAIR Left 09/20/2018   Procedure: SHOULDER ARTHROSCOPY WITH OPEN ROTATOR CUFF REPAIR;  Surgeon: Edie Norleen PARAS, MD;  Location: [facility] ORS;  Service: Orthopedics;  Laterality: Left;   SHOULDER CLOSED REDUCTION Left 12/21/2018   Procedure: MANIPULATION UNDER ANESTHESIA WITH STEROID INJECTION;  Surgeon: Edie Norleen PARAS, MD;  Location: [facility];  Service: Orthopedics;  Laterality: Left;  Diabetic - insulin  and oral meds   UMBILICAL HERNIA REPAIR     XI ROBOTIC ASSISTED INGUINAL  HERNIA REPAIR WITH MESH Left 12/16/2021   Procedure: XI ROBOTIC ASSISTED INGUINAL HERNIA REPAIR WITH MESH;  Surgeon: Desiderio Schanz, MD;  Location: [facility] ORS;  Service: General;  Laterality: Left;     reports that she has never smoked. She has never been exposed to tobacco smoke. She has never used smokeless tobacco. She reports that she does not drink alcohol and does not use drugs.  Allergies[1]  Family History  Problem Relation Age of Onset   Diabetes Mother    Hypertension Mother    Arthritis Father    Diabetes Father    Heart disease Father    Hypertension Father    Diabetes Other    Hypertension Other    ADD / ADHD Daughter    Asthma Daughter    ADD / ADHD Daughter    Asthma Daughter    Breast cancer Neg Hx    Colon cancer Neg Hx    Vomiting,  Prior to Admission medications  Medication  Sig Start Date End Date Taking? Authorizing Provider  acetaminophen  (TYLENOL ) 500 MG tablet Take 2 tablets (1,000 mg total) by mouth every 6 (six) hours as needed for mild pain. 12/16/21   Desiderio Schanz, MD  albuterol  (VENTOLIN  HFA) 108 (90 Base) MCG/ACT inhaler Inhale 2 puffs into the lungs every 6 (six) hours as needed for wheezing or shortness of breath.    [provider]  amLODipine  (NORVASC ) 5 MG tablet Take 1 tablet (5 mg total) by mouth 2 (two) times daily. 10/07/24   Glendia Shad, MD  Azelastine  HCl 137 MCG/SPRAY SOLN PLACE 2 SPRAYS INTO BOTH NOSTRILS 2 (TWO) TIMES DAILY. USE IN EACH NOSTRIL AS DIRECTED 11/22/23   Maribeth Camellia MATSU, MD  benzonatate  (TESSALON ) 100 MG capsule Take 1 capsule (100 mg total) by mouth every 8 (eight) hours. 08/25/24   Teresa Shelba SAUNDERS, NP  Cholecalciferol  (VITAMIN D ) 50 MCG (2000 UT) tablet Take 2,000 Units by mouth daily.    [provider]  CREON  36000-114000 units CPEP capsule Take 1-2 capsules (36,000-72,000 Units total) by mouth See admin instructions. Take 720000 units with each meal and 36000 units with snacks 07/15/22   Vanga, Rohini  Reddy, MD  escitalopram  (LEXAPRO ) 5 MG tablet Take 1 tablet (5 mg total) by mouth daily. 10/20/24   Glendia Shad, MD  fluticasone -salmeterol (WIXELA INHUB) 100-50 MCG/ACT AEPB INHALE 1 PUFF INTO THE LUNGS TWICE A DAY 08/11/24   Glendia Shad, MD  hydrALAZINE  (APRESOLINE ) 10 MG tablet Take 1 tablet (10 mg total) by mouth 3 (three) times daily. 10/20/24   Glendia Shad, MD  insulin  degludec (TRESIBA  FLEXTOUCH) 100 UNIT/ML FlexTouch Pen Inject 16 Units into the skin daily. 04/05/23   Glendia Shad, MD  ipratropium (ATROVENT ) 0.03 % nasal spray Place 2 sprays into both nostrils every 12 (twelve) hours. 08/25/24   White, Shelba SAUNDERS, NP  loperamide  (IMODIUM ) 2 MG capsule Take 4 mg by mouth daily.    [provider]  losartan  (COZAAR ) 100 MG tablet TAKE 1 TABLET(100 MG) BY MOUTH AT BEDTIME 01/03/24   Glendia Shad, MD  mometasone  (ELOCON ) 0.1 % ointment Apply 1 Application topically 2 (two) times daily. 03/16/23   [provider]  omeprazole  (PRILOSEC) 20 MG capsule TAKE 1 CAPSULE(20 MG) BY MOUTH TWICE DAILY 10/06/24   Glendia Shad, MD  ONE TOUCH ULTRA TEST test strip PATIENT NEEDS NEW METER STRIPS AND LANCETS FOR ONE TOUCH. HER INSURANCE NO LONGER COVERS ACCUCHEK. 07/31/15   Glendia Shad, MD  pantoprazole  (PROTONIX ) 40 MG tablet Take 1 tablet (40 mg total) by mouth 2 (two) times daily before a meal. 10/07/24 12/06/24  Ward, Josette SAILOR, DO  potassium chloride  (KLOR-CON  M) 10 MEQ tablet TAKE 1 TABLET BY MOUTH EVERY DAY 03/14/24   Glendia Shad, MD  predniSONE  (STERAPRED UNI-PAK 21 TAB) 10 MG (21) TBPK tablet Take by mouth daily. Take 6 tabs by mouth daily  for 1 days, then 5 tabs for 1 days, then 4 tabs for 1 days, then 3 tabs for 1 days, 2 tabs for 1 days, then 1 tab by mouth daily for 1 days 10/30/24   Teresa Shelba SAUNDERS, NP  rosuvastatin  (CRESTOR ) 10 MG tablet Take 1 tablet (10 mg total) by mouth daily. 04/11/24   Glendia Shad, MD  vedolizumab  (ENTYVIO ) 300 MG injection Inject 300  mg into the vein 4 (four) times a week. 12/23/23   Unk Corinn Skiff, MD  zolpidem  (AMBIEN  CR) 12.5 MG CR tablet Take 1 tablet (12.5 mg total) by mouth  at bedtime as needed for sleep. 09/14/24   Neda Jennet LABOR, MD    Physical Exam: Vitals:   11/12/24 1050 11/12/24 1055 11/12/24 1120  BP: 133/70    Pulse: 98    Resp: 20    Temp: 98 F (36.7 C)    TempSrc: Oral    SpO2: 96%  98%  Weight:  62.6 kg   Height:  5' 4 (1.626 m)     Constitutional: NAD, calm, comfortable Vitals:   11/12/24 1050 11/12/24 1055 11/12/24 1120  BP: 133/70    Pulse: 98    Resp: 20    Temp: 98 F (36.7 C)    TempSrc: Oral    SpO2: 96%  98%  Weight:  62.6 kg   Height:  5' 4 (1.626 m)    Eyes: PERRL, lids and conjunctivae normal ENMT: Mucous membranes are moist. Posterior pharynx clear of any exudate or lesions.Normal dentition.  Neck: normal, supple, no masses, no thyromegaly Respiratory: Diminished breathing sound on left lateral fields, increasing crackles on left side, no wheezing, increasing respiratory effort. No accessory muscle use.  Cardiovascular: Regular rate and rhythm, no murmurs / rubs / gallops. No extremity edema. 2+ pedal pulses. No carotid bruits.  Abdomen: no tenderness, no masses palpated. No hepatosplenomegaly. Bowel sounds positive.  Musculoskeletal: no clubbing / cyanosis. No joint deformity upper and lower extremities. Good ROM, no contractures. Normal muscle tone.  Skin: no rashes, lesions, ulcers. No induration Neurologic: CN 2-12 grossly intact. Sensation intact, DTR normal. Strength 5/5 in all 4.  Psychiatric: Normal judgment and insight. Alert and oriented x 3. Normal mood.  [place] on Admission: I have personally reviewed following labs and imaging studies  CBC: Recent Labs  Lab 11/12/24 1057  WBC 22.7*  HGB 12.3  HCT 37.2  MCV 87.5  PLT 282   Basic Metabolic Panel: Recent Labs  Lab 11/12/24 1051  NA 133*  K 3.4*  CL 93*  CO2 26  GLUCOSE 211*  BUN  25*  CREATININE 1.57*  CALCIUM  8.6*   GFR: Estimated Creatinine Clearance: 27.6 mL/min (A) (by C-G formula based on SCr of 1.57 mg/dL (H)). Liver Function Tests: No results for input(s): AST, ALT, ALKPHOS, BILITOT, PROT, ALBUMIN in the last 168 hours. No results for input(s): LIPASE, AMYLASE in the last 168 hours. No results for input(s): AMMONIA in the last 168 hours. Coagulation Profile: No results for input(s): INR, PROTIME in the last 168 hours. Cardiac Enzymes: No results for input(s): CKTOTAL, CKMB, CKMBINDEX, TROPONINI in the last 168 hours. BNP (last 3 results) Recent Labs    11/12/24 1051  PROBNP 1,157.0*   HbA1C: No results for input(s): HGBA1C in the last 72 hours. CBG: No results for input(s): GLUCAP in the last 168 hours. Lipid Profile: No results for input(s): CHOL, HDL, LDLCALC, TRIG, CHOLHDL, LDLDIRECT in the last 72 hours. Thyroid  Function Tests: No results for input(s): TSH, T4TOTAL, FREET4, T3FREE, THYROIDAB in the last 72 hours. Anemia Panel: No results for input(s): VITAMINB12, FOLATE, FERRITIN, TIBC, IRON, RETICCTPCT in the last 72 hours. Urine analysis:    Component Value Date/Time   COLORURINE STRAW (A) 02/19/2023 0000   APPEARANCEUR CLEAR (A) 02/19/2023 0000   LABSPEC 1.005 02/19/2023 0000   PHURINE 6.0 02/19/2023 0000   GLUCOSEU NEGATIVE 02/19/2023 0000   GLUCOSEU NEGATIVE 12/21/2022 1025   HGBUR NEGATIVE 02/19/2023 0000   BILIRUBINUR NEGATIVE 02/19/2023 0000   KETONESUR NEGATIVE 02/19/2023 0000   PROTEINUR NEGATIVE 02/19/2023 0000   UROBILINOGEN 0.2 12/21/2022  1025   NITRITE NEGATIVE 02/19/2023 0000   LEUKOCYTESUR TRACE (A) 02/19/2023 0000    Radiological Exams on Admission: CT CHEST WO CONTRAST Result Date: 11/12/2024 CLINICAL DATA:  Pneumonia. EXAM: CT CHEST WITHOUT CONTRAST TECHNIQUE: Multidetector CT imaging of the chest was performed following the standard protocol  without IV contrast. RADIATION DOSE REDUCTION: This exam was performed according to the departmental dose-optimization program which includes automated exposure control, adjustment of the mA and/or kV according to patient size and/or use of iterative reconstruction technique. COMPARISON:  09/11/2004. FINDINGS: Cardiovascular: Atherosclerotic calcification of the aorta, aortic valve and coronary arteries. Heart is at the upper limits of normal in size to mildly enlarged. Small pericardial effusion. Mediastinum/Nodes: No pathologically enlarged mediastinal or axillary lymph nodes. Hilar regions are difficult to definitively evaluate without IV contrast. Air in the esophagus can be seen with dysmotility. Lungs/Pleura: Left lower lobe consolidation. Peribronchovascular nodularity with mucoid impaction and volume loss in the posterior segment right upper lobe and right lower lobe. Small left pleural effusion. Airway is unremarkable. Upper Abdomen: Liver is decreased in attenuation diffusely. Cholecystectomy. Elevated left hemidiaphragm. Visualized portions of the liver, adrenal glands, left kidney, spleen, pancreas, stomach and bowel are otherwise grossly unremarkable. No upper abdominal adenopathy. Musculoskeletal: Degenerative changes in the spine. IMPRESSION: 1. Multi lobar pneumonia, worse in the left lower lobe. Followup PA and lateral chest X-ray is recommended in 3-4 weeks following trial of antibiotic therapy to ensure resolution and exclude underlying malignancy. 2. Small left pleural effusion. 3. Small pericardial effusion. 4. Hepatic steatosis. 5. Aortic atherosclerosis (ICD10-I70.0). Coronary artery calcification. Electronically Signed   By: Newell Eke M.D.   On: 11/12/2024 16:14   DG Chest 2 View Result Date: 11/12/2024 CLINICAL DATA:  Flu like symptoms x1 month with left-sided abdominal pain, cough and shortness of breath. EXAM: CHEST - 2 VIEW COMPARISON:  None Available. FINDINGS: The heart size  and mediastinal contours are within normal limits. Mild to moderate severity infiltrate is seen within the left lung base. There is mild elevation of the left hemidiaphragm. A very small left pleural effusion is suspected. No pneumothorax is identified. The visualized skeletal structures are unremarkable. IMPRESSION: 1. Mild to moderate severity left basilar infiltrate. 2. Very small left pleural effusion. Electronically Signed   By: Suzen Dials M.D.   On: 11/12/2024 11:32    EKG: Independently reviewed.  Sinus rhythm, no acute ST changes, LVH.  Assessment/Plan Active Problems:   CAP (community acquired pneumonia)   Sepsis (HCC)  (please populate well all problems here in Problem List. (For example, if patient is on BP meds at home and you resume or decide to hold them, it is a problem that needs to be her. Same for CAD, COPD, HLD and so on)  Multifocal pneumonia - Continue Zosyn  and doxycycline  - CT chest showed no abscess or empyema - Sputum culture - Pain control - Incentive spirometry and flutter valve  AKI - Probably prerenal secondary to poor oral intake and diarrhea - IV fluid, recheck kidney function tomorrow  Hypokalemia - P.o. replacement - Check magnesium level  Pleural chest pain - No empyema or pleural effusions on CT chest. - Other DDx, low suspicion for ACS given the location and nature of the chest pain. - As needed Dilaudid  and lidocaine  patch for pain control  Acute on chronic diarrhea History of pancreatic insufficiency History of colon polypsis - Appears to be a flareup of her chronic GI conditions - Continue Creon  -Add probiotic  IDDM with hyperglycemia -  SSI for now  COPD - Stable, continue DuoNebs and as needed albuterol  -ICS and LABA  GERD - PPI  DVT prophylaxis: Heparin  subcu Code Status: Full code Family Communication: None at bedside Disposition Plan: Patient sick with pneumonia, AKI, requiring IV antibiotics IV fluid resuscitation,  expect more than 2 midnight hospital stay. Consults called: None Admission status: PCU admit   Cort ONEIDA Mana MD [facility] Pager [phone]  11/12/2024, 4:23 PM       [1]  Allergies Allergen Reactions   Aspirin  Other (See Comments)    Increased bleeding   Beta Adrenergic Blockers     3rd degree blockage    Cobalamin Combinations Other (See Comments)    Respiratory symptoms  Respiratory symptoms   Lisinopril  Cough   Mtx Support [Cobalamine Combinations] Other (See Comments)    Respiratory symptoms   Nsaids    Vasotec [Enalapril] Cough   Verapamil  Other (See Comments)    Heart block   Voltaren [Diclofenac Sodium]     Pt unsure of reaction    Erythromycin  Other (See Comments)    Gi intolerance   Indocin [Indomethacin]     Upset stomach   Jardiance  [Empagliflozin ] Other (See Comments)    Recurrent yeast infections, even with washout and rechallenge   "

## 2024-11-12 NOTE — ED Triage Notes (Signed)
 First Nurse Note:  Pt via [facility] from home. Pt c/o flu like symptoms x1 month, recently has had poor intake and generalized weakness. Pt also c/o L sided abd pain when she coughs. Also reports some SOB. Pt has a hx of DM and COPD. Pt is A&Ox4 and NAD  EMS reports:  141/84 BP  90% on RA, 95% on 3L  90 HR  15 RR 226 CBG  20G R AC, EMS giving 500mL LR bolus at this time.

## 2024-11-13 ENCOUNTER — Ambulatory Visit: Admitting: Pulmonary Disease

## 2024-11-13 ENCOUNTER — Encounter: Payer: Self-pay | Admitting: Pulmonary Disease

## 2024-11-13 DIAGNOSIS — J159 Unspecified bacterial pneumonia: Secondary | ICD-10-CM

## 2024-11-13 DIAGNOSIS — J1000 Influenza due to other identified influenza virus with unspecified type of pneumonia: Secondary | ICD-10-CM

## 2024-11-13 DIAGNOSIS — N179 Acute kidney failure, unspecified: Secondary | ICD-10-CM | POA: Diagnosis not present

## 2024-11-13 DIAGNOSIS — Z794 Long term (current) use of insulin: Secondary | ICD-10-CM | POA: Diagnosis not present

## 2024-11-13 DIAGNOSIS — E1121 Type 2 diabetes mellitus with diabetic nephropathy: Secondary | ICD-10-CM | POA: Diagnosis not present

## 2024-11-13 DIAGNOSIS — E119 Type 2 diabetes mellitus without complications: Secondary | ICD-10-CM | POA: Diagnosis not present

## 2024-11-13 DIAGNOSIS — R0781 Pleurodynia: Secondary | ICD-10-CM | POA: Diagnosis not present

## 2024-11-13 DIAGNOSIS — J189 Pneumonia, unspecified organism: Principal | ICD-10-CM

## 2024-11-13 LAB — BASIC METABOLIC PANEL WITH GFR
Anion gap: 9 (ref 5–15)
BUN: 30 mg/dL — ABNORMAL HIGH (ref 8–23)
CO2: 24 mmol/L (ref 22–32)
Calcium: 7.6 mg/dL — ABNORMAL LOW (ref 8.9–10.3)
Chloride: 101 mmol/L (ref 98–111)
Creatinine, Ser: 1.25 mg/dL — ABNORMAL HIGH (ref 0.44–1.00)
GFR, Estimated: 45 mL/min — ABNORMAL LOW
Glucose, Bld: 72 mg/dL (ref 70–99)
Potassium: 3.8 mmol/L (ref 3.5–5.1)
Sodium: 133 mmol/L — ABNORMAL LOW (ref 135–145)

## 2024-11-13 LAB — CBG MONITORING, ED
Glucose-Capillary: 74 mg/dL (ref 70–99)
Glucose-Capillary: 105 mg/dL — ABNORMAL HIGH (ref 70–99)
Glucose-Capillary: 139 mg/dL — ABNORMAL HIGH (ref 70–99)

## 2024-11-13 LAB — CBC
HCT: 29.8 % — ABNORMAL LOW (ref 36.0–46.0)
Hemoglobin: 9.8 g/dL — ABNORMAL LOW (ref 12.0–15.0)
MCH: 28.7 pg (ref 26.0–34.0)
MCHC: 32.9 g/dL (ref 30.0–36.0)
MCV: 87.4 fL (ref 80.0–100.0)
Platelets: 206 K/uL (ref 150–400)
RBC: 3.41 MIL/uL — ABNORMAL LOW (ref 3.87–5.11)
RDW: 16.3 % — ABNORMAL HIGH (ref 11.5–15.5)
WBC: 17.2 K/uL — ABNORMAL HIGH (ref 4.0–10.5)
nRBC: 0 % (ref 0.0–0.2)

## 2024-11-13 LAB — MRSA NEXT GEN BY PCR, NASAL: MRSA by PCR Next Gen: NOT DETECTED

## 2024-11-13 LAB — LACTIC ACID, PLASMA: Lactic Acid, Venous: 0.8 mmol/L (ref 0.5–1.9)

## 2024-11-13 LAB — STREP PNEUMONIAE URINARY ANTIGEN: Strep Pneumo Urinary Antigen: NEGATIVE

## 2024-11-13 MED ORDER — OSELTAMIVIR PHOSPHATE 30 MG PO CAPS
30.0000 mg | ORAL_CAPSULE | Freq: Every day | ORAL | Status: DC
  Administered 2024-11-13: 30 mg via ORAL
  Filled 2024-11-13: qty 1

## 2024-11-13 MED ORDER — OSELTAMIVIR PHOSPHATE 30 MG PO CAPS
30.0000 mg | ORAL_CAPSULE | Freq: Two times a day (BID) | ORAL | Status: AC
  Administered 2024-11-13 – 2024-11-17 (×9): 30 mg via ORAL
  Filled 2024-11-13 (×9): qty 1

## 2024-11-13 MED ORDER — SODIUM CHLORIDE 0.9 % IV SOLN
2.0000 g | INTRAVENOUS | Status: DC
  Administered 2024-11-13 – 2024-11-18 (×6): 2 g via INTRAVENOUS
  Filled 2024-11-13 (×7): qty 20

## 2024-11-13 MED ORDER — COLCHICINE 0.6 MG PO TABS
0.6000 mg | ORAL_TABLET | Freq: Every day | ORAL | Status: AC
  Administered 2024-11-13 – 2024-11-15 (×3): 0.6 mg via ORAL
  Filled 2024-11-13 (×3): qty 1

## 2024-11-13 MED ORDER — GUAIFENESIN-CODEINE 100-10 MG/5ML PO SOLN
5.0000 mL | Freq: Four times a day (QID) | ORAL | Status: DC | PRN
  Administered 2024-11-13 – 2024-11-22 (×21): 5 mL via ORAL
  Filled 2024-11-13 (×23): qty 5

## 2024-11-13 NOTE — Hospital Course (Signed)
 74 y.o. female with medical history significant of chronic pain diarrhea, chronic pancreatic insufficiency, IDDM, HTN, HLD, COPD, presented with worsening of cough shortness of breath and left-sided chest pain.    Assessment and Plan:   Influenza A with concern for bacterial superinfection - Multifocal pneumonia noted on imaging.  Coarse cough with thick sputum production.  Rapid swab positive for influenza A.  Procalcitonin 100.  Profound leukocytosis.  Initiated on Tamiflu  empirically.  Was initiated on Zosyn  subsequently transition to ceftriaxone  2 g every 24 hours plus doxycycline .  MRSA nares negative.  Monitor respiratory function closely.  Leukocytosis showing continued improvement.  Will recheck CBC in AM.   Pleuritic chest pain - Coinciding with pneumonia above, CT imaging noting worse than the left lower lobe.  Initiated on colchicine  1.2 mg daily.  Will attempt IV Toradol  x 1.  Reported allergy to NSAIDs however patient states she takes ibuprofen at home.  Kidney function improved this morning as well.  Monitor response for now.   Acute kidney injury - Creatinine 1.57 on presentation (baseline 0.9).  Showing improvement this morning after IV fluid hydration, creatinine down to 1.14.  Will continue to monitor urine output and recheck BMP in AM.   Normocytic anemia - Hemoglobin less than 10 this morning with stable, likely result of volume dilution.  No active bleeding appreciated.  Will recheck CBC in AM.   Insulin -dependent diabetes mellitus - Insulin  sliding scale on board.   Acute on chronic diarrhea - Appears to be resolved at this time.  History of pancreatic insufficiency, follows with GI in the outpatient setting.   COPD - No wheezing appreciated.  Nebulizers on board.

## 2024-11-13 NOTE — Telephone Encounter (Signed)
 In reviewing the medication list, it states not taking. Please confirm she is taking lexapro 

## 2024-11-13 NOTE — Progress Notes (Signed)
 " Progress Note   Patient: Ashley Gross FMW:[id number] DOB: [birth year] DOA: 11/12/2024  DOS: the patient was seen and examined on 11/13/2024   Brief hospital course:  74 y.o. female with medical history significant of chronic pain diarrhea, chronic pancreatic insufficiency, IDDM, HTN, HLD, COPD, presented with worsening of cough shortness of breath and left-sided chest pain.   Assessment and Plan:  Influenza A with concern for bacterial superinfection - Multifocal pneumonia noted on imaging.  Coarse cough with thick sputum production.  Rapid swab positive for influenza A.  Procalcitonin 100.  Profound leukocytosis.  Initiated on Tamiflu  empirically.  Was initiated on Zosyn  empirically as well.  Will transition to ceftriaxone  2 g every 24 hours.  Continue doxycycline .  Will order MRSA nares as well.  Monitor respiratory function closely.  Will recheck CBC in AM.  Pleuritic chest pain - Coinciding with pneumonia above, CT imaging noting worse than the left lower lobe.  Reported allergies to NSAIDs, likely to affect renal function anyway.  Will order colchicine  1.2 mg daily.  Monitor response for now.  Acute kidney injury - Creatinine 1.57 on presentation (baseline 0.9).  Showing improvement this morning after IV fluid hydration, creatinine down to 1.5.  Will continue to monitor urine output and recheck BMP in AM.  Normocytic anemia - Hemoglobin less than 10 this morning, likely result of volume dilution.  No active bleeding appreciated.  Will recheck CBC in AM.  Insulin -dependent diabetes mellitus - Insulin  sliding scale on board.  Acute on chronic diarrhea - Appears to be resolved at this time.  History of pancreatic insufficiency, follows with GI in the outpatient setting.  COPD - No wheezing appreciated.  Nebulizers on board.   Subjective: Patient resting comfortably this morning however in profound discomfort with coughing.  Has thick coarse sounding cough.  Denies worsening  shortness of breath, fever, nausea, vomiting, abdominal pain.  No loose stools since yesterday.  Only 1 time yesterday.  Physical Exam:  Vitals:   11/13/24 0742 11/13/24 0806 11/13/24 0909 11/13/24 1108  BP:  (!) 143/53 114/76 (!) 111/52  Pulse:  88  82  Resp:  (!) 25  (!) 24  Temp: 99.4 F (37.4 C)     TempSrc: Oral     SpO2:  98%  93%  Weight:      Height:        GENERAL:  Alert, pleasant, malaise HEENT:  EOMI CARDIOVASCULAR:  RRR, no murmurs appreciated RESPIRATORY: Coarse cough with sputum production, rhonchi bilaterally GASTROINTESTINAL:  Soft, nontender, nondistended EXTREMITIES:  No LE edema bilaterally NEURO:  No new focal deficits appreciated SKIN:  No rashes noted PSYCH:  Appropriate mood and affect     Data Reviewed:  Imaging Studies: CT CHEST WO CONTRAST Result Date: 11/12/2024 CLINICAL DATA:  Pneumonia. EXAM: CT CHEST WITHOUT CONTRAST TECHNIQUE: Multidetector CT imaging of the chest was performed following the standard protocol without IV contrast. RADIATION DOSE REDUCTION: This exam was performed according to the departmental dose-optimization program which includes automated exposure control, adjustment of the mA and/or kV according to patient size and/or use of iterative reconstruction technique. COMPARISON:  09/11/2004. FINDINGS: Cardiovascular: Atherosclerotic calcification of the aorta, aortic valve and coronary arteries. Heart is at the upper limits of normal in size to mildly enlarged. Small pericardial effusion. Mediastinum/Nodes: No pathologically enlarged mediastinal or axillary lymph nodes. Hilar regions are difficult to definitively evaluate without IV contrast. Air in the esophagus can be seen with dysmotility. Lungs/Pleura: Left lower lobe consolidation. Peribronchovascular nodularity with  mucoid impaction and volume loss in the posterior segment right upper lobe and right lower lobe. Small left pleural effusion. Airway is unremarkable. Upper Abdomen:  Liver is decreased in attenuation diffusely. Cholecystectomy. Elevated left hemidiaphragm. Visualized portions of the liver, adrenal glands, left kidney, spleen, pancreas, stomach and bowel are otherwise grossly unremarkable. No upper abdominal adenopathy. Musculoskeletal: Degenerative changes in the spine. IMPRESSION: 1. Multi lobar pneumonia, worse in the left lower lobe. Followup PA and lateral chest X-ray is recommended in 3-4 weeks following trial of antibiotic therapy to ensure resolution and exclude underlying malignancy. 2. Small left pleural effusion. 3. Small pericardial effusion. 4. Hepatic steatosis. 5. Aortic atherosclerosis (ICD10-I70.0). Coronary artery calcification. Electronically Signed   By: Newell Eke M.D.   On: 11/12/2024 16:14   DG Chest 2 View Result Date: 11/12/2024 CLINICAL DATA:  Flu like symptoms x1 month with left-sided abdominal pain, cough and shortness of breath. EXAM: CHEST - 2 VIEW COMPARISON:  None Available. FINDINGS: The heart size and mediastinal contours are within normal limits. Mild to moderate severity infiltrate is seen within the left lung base. There is mild elevation of the left hemidiaphragm. A very small left pleural effusion is suspected. No pneumothorax is identified. The visualized skeletal structures are unremarkable. IMPRESSION: 1. Mild to moderate severity left basilar infiltrate. 2. Very small left pleural effusion. Electronically Signed   By: Suzen Dials M.D.   On: 11/12/2024 11:32   DG Chest 2 View Result Date: 10/21/2024 CLINICAL DATA:  Chest pain EXAM: CHEST - 2 VIEW COMPARISON:  10/06/2024 FINDINGS: The heart size and mediastinal contours are within normal limits. Both lungs are clear. The visualized skeletal structures are unremarkable. IMPRESSION: No active cardiopulmonary disease. Electronically Signed   By: Oneil Devonshire M.D.   On: 10/21/2024 03:04    There are no new results to review at this time.  Previous records (including but  not limited to H&P, progress notes, nursing notes, TOC management) were reviewed in assessment of this patient.  Labs: CBC: Recent Labs  Lab 11/12/24 1057 11/13/24 0736  WBC 22.7* 17.2*  HGB 12.3 9.8*  HCT 37.2 29.8*  MCV 87.5 87.4  PLT 282 206   Basic Metabolic Panel: Recent Labs  Lab 11/12/24 1051 11/12/24 1145 11/13/24 0736  NA 133*  --  133*  K 3.4*  --  3.8  CL 93*  --  101  CO2 26  --  24  GLUCOSE 211*  --  72  BUN 25*  --  30*  CREATININE 1.57*  --  1.25*  CALCIUM  8.6*  --  7.6*  MG  --  1.9  --    Liver Function Tests: No results for input(s): AST, ALT, ALKPHOS, BILITOT, PROT, ALBUMIN in the last 168 hours. CBG: Recent Labs  Lab 11/12/24 1830 11/12/24 2203 11/13/24 0801  GLUCAP 164* 98 74    Scheduled Meds:  acidophilus  2 capsule Oral TID   colchicine   0.6 mg Oral Daily   escitalopram   5 mg Oral Daily   fluticasone  furoate-vilanterol  1 puff Inhalation Daily   guaiFENesin   600 mg Oral BID   heparin   5,000 Units Subcutaneous Q12H   hydrALAZINE   10 mg Oral TID   insulin  aspart  0-9 Units Subcutaneous TID WC   insulin  glargine  10 Units Subcutaneous Daily   ipratropium-albuterol   3 mL Nebulization Q6H   lidocaine   1 patch Transdermal Q24H   oseltamivir   30 mg Oral Daily   pantoprazole   40 mg Oral  Daily   rosuvastatin   10 mg Oral Daily   Continuous Infusions:  doxycycline  (VIBRAMYCIN ) IV Stopped (11/13/24 0050)   piperacillin -tazobactam (ZOSYN )  IV Stopped (11/13/24 1002)   PRN Meds:.acetaminophen  **OR** acetaminophen , albuterol , benzonatate , HYDROmorphone  (DILAUDID ) injection, ipratropium, loperamide , ondansetron  **OR** ondansetron  (ZOFRAN ) IV, senna-docusate, zolpidem   Family Communication: None at bedside  Disposition: Status is: Inpatient Remains inpatient appropriate because: Pneumonia     Time spent: 40 minutes  Length of inpatient stay: 1 days  Author: Carliss LELON Canales, DO 11/13/2024 11:22 AM  For on call review  [email or web address]   "

## 2024-11-13 NOTE — Progress Notes (Signed)
 PHARMACY NOTE:  ANTIMICROBIAL RENAL DOSAGE ADJUSTMENT  Current antimicrobial regimen includes a mismatch between antimicrobial dosage and estimated renal function.  As per policy approved by the Pharmacy & Therapeutics and Medical Executive Committees, the antimicrobial dosage will be adjusted accordingly.  Current antimicrobial dosage:  Tamiflu  30 mg daily x 5 days  Indication: Influenza A  Renal Function:  Estimated Creatinine Clearance: 34.6 mL/min (A) (by C-G formula based on SCr of 1.25 mg/dL (H)). []      On intermittent HD, scheduled: []      On CRRT    Antimicrobial dosage has been changed to:  Tamiflu  30 mg twice daily x 5 days  Additional comments:   Thank you for allowing pharmacy to be a part of this patient's care.  Lum DEL [place] 11/13/2024 2:03 PM

## 2024-11-14 DIAGNOSIS — E119 Type 2 diabetes mellitus without complications: Secondary | ICD-10-CM

## 2024-11-14 DIAGNOSIS — J189 Pneumonia, unspecified organism: Secondary | ICD-10-CM | POA: Diagnosis not present

## 2024-11-14 DIAGNOSIS — J1000 Influenza due to other identified influenza virus with unspecified type of pneumonia: Secondary | ICD-10-CM | POA: Diagnosis not present

## 2024-11-14 DIAGNOSIS — N179 Acute kidney failure, unspecified: Secondary | ICD-10-CM | POA: Diagnosis not present

## 2024-11-14 DIAGNOSIS — Z794 Long term (current) use of insulin: Secondary | ICD-10-CM

## 2024-11-14 LAB — BASIC METABOLIC PANEL WITH GFR
Anion gap: 8 (ref 5–15)
BUN: 27 mg/dL — ABNORMAL HIGH (ref 8–23)
CO2: 23 mmol/L (ref 22–32)
Calcium: 7.7 mg/dL — ABNORMAL LOW (ref 8.9–10.3)
Chloride: 103 mmol/L (ref 98–111)
Creatinine, Ser: 1.14 mg/dL — ABNORMAL HIGH (ref 0.44–1.00)
GFR, Estimated: 51 mL/min — ABNORMAL LOW
Glucose, Bld: 64 mg/dL — ABNORMAL LOW (ref 70–99)
Potassium: 3 mmol/L — ABNORMAL LOW (ref 3.5–5.1)
Sodium: 134 mmol/L — ABNORMAL LOW (ref 135–145)

## 2024-11-14 LAB — GLUCOSE, CAPILLARY
Glucose-Capillary: 99 mg/dL (ref 70–99)
Glucose-Capillary: 116 mg/dL — ABNORMAL HIGH (ref 70–99)
Glucose-Capillary: 150 mg/dL — ABNORMAL HIGH (ref 70–99)

## 2024-11-14 LAB — CBC
HCT: 27.8 % — ABNORMAL LOW (ref 36.0–46.0)
Hemoglobin: 9.3 g/dL — ABNORMAL LOW (ref 12.0–15.0)
MCH: 29.3 pg (ref 26.0–34.0)
MCHC: 33.5 g/dL (ref 30.0–36.0)
MCV: 87.7 fL (ref 80.0–100.0)
Platelets: 201 K/uL (ref 150–400)
RBC: 3.17 MIL/uL — ABNORMAL LOW (ref 3.87–5.11)
RDW: 16.3 % — ABNORMAL HIGH (ref 11.5–15.5)
WBC: 11.9 K/uL — ABNORMAL HIGH (ref 4.0–10.5)
nRBC: 0 % (ref 0.0–0.2)

## 2024-11-14 MED ORDER — DOXYCYCLINE HYCLATE 100 MG PO TABS
100.0000 mg | ORAL_TABLET | Freq: Two times a day (BID) | ORAL | Status: DC
  Administered 2024-11-14 – 2024-11-19 (×10): 100 mg via ORAL
  Filled 2024-11-14 (×11): qty 1

## 2024-11-14 MED ORDER — IPRATROPIUM-ALBUTEROL 0.5-2.5 (3) MG/3ML IN SOLN
3.0000 mL | Freq: Three times a day (TID) | RESPIRATORY_TRACT | Status: DC
  Administered 2024-11-14 – 2024-11-15 (×3): 3 mL via RESPIRATORY_TRACT
  Filled 2024-11-14 (×2): qty 3

## 2024-11-14 MED ORDER — KETOROLAC TROMETHAMINE 15 MG/ML IJ SOLN
15.0000 mg | Freq: Once | INTRAMUSCULAR | Status: AC
  Administered 2024-11-14: 15 mg via INTRAVENOUS
  Filled 2024-11-14: qty 1

## 2024-11-14 NOTE — Progress Notes (Signed)
 PT Cancellation Note  Patient Details Name: Ashley Gross MRN: [id number] DOB: Feb 14, 1951   Cancelled Treatment:    Reason Eval/Treat Not Completed: Medical issues which prohibited therapy Per OT, patient is not able to participate with PT today. Will re-attempt tomorrow.   Caitlynn Ju 11/14/2024, 12:57 PM

## 2024-11-14 NOTE — Progress Notes (Signed)
 PHARMACIST - PHYSICIAN COMMUNICATION DR:   TRH CONCERNING: Antibiotic IV to Oral Route Change Policy  RECOMMENDATION: This patient is receiving Doxycycline  by the intravenous route.  Based on criteria approved by the Pharmacy and Therapeutics Committee, the antibiotic(s) is/are being converted to the equivalent oral dose form(s).   DESCRIPTION: These criteria include: Patient being treated for a respiratory tract infection, urinary tract infection, cellulitis or clostridium difficile associated diarrhea if on metronidazole  The patient is not neutropenic and does not exhibit a GI malabsorption state The patient is eating (either orally or via tube) and/or has been taking other orally administered medications for a least 24 hours The patient is improving clinically and has a Tmax < 100.5  If you have questions about this conversion, please contact the Pharmacy Department  []   [phone] )  [facility] [x]   [phone] )  [facility] []   [phone] )  [facility] []   [phone] )  [facility] []   [phone] )  [facility]   Doxycycline  IV shortage  Celestine Slovak, PharmD, BCPS, [place] Work Cell: [phone] 11/14/2024 1:40 PM

## 2024-11-14 NOTE — Progress Notes (Signed)
 " Progress Note   Patient: Ashley Gross FMW:[id number] DOB: 1951-04-14 DOA: 11/12/2024  DOS: the patient was seen and examined on 11/14/2024   Brief hospital course:  74 y.o. female with medical history significant of chronic pain diarrhea, chronic pancreatic insufficiency, IDDM, HTN, HLD, COPD, presented with worsening of cough shortness of breath and left-sided chest pain.    Assessment and Plan:   Influenza A with concern for bacterial superinfection - Multifocal pneumonia noted on imaging.  Coarse cough with thick sputum production.  Rapid swab positive for influenza A.  Procalcitonin 100.  Profound leukocytosis.  Initiated on Tamiflu  empirically.  Was initiated on Zosyn  subsequently transition to ceftriaxone  2 g every 24 hours plus doxycycline .  MRSA nares negative.  Monitor respiratory function closely.  Leukocytosis showing continued improvement.  Will recheck CBC in AM.   Pleuritic chest pain - Coinciding with pneumonia above, CT imaging noting worse than the left lower lobe.  Initiated on colchicine  1.2 mg daily.  Will attempt IV Toradol  x 1.  Reported allergy to NSAIDs however patient states she takes ibuprofen at home.  Kidney function improved this morning as well.  Monitor response for now.   Acute kidney injury - Creatinine 1.57 on presentation (baseline 0.9).  Showing improvement this morning after IV fluid hydration, creatinine down to 1.14.  Will continue to monitor urine output and recheck BMP in AM.   Normocytic anemia - Hemoglobin less than 10 this morning with stable, likely result of volume dilution.  No active bleeding appreciated.  Will recheck CBC in AM.   Insulin -dependent diabetes mellitus - Insulin  sliding scale on board.   Acute on chronic diarrhea - Appears to be resolved at this time.  History of pancreatic insufficiency, follows with GI in the outpatient setting.   COPD - No wheezing appreciated.  Nebulizers on board.   Subjective: Patient resting  comfortably this morning.  Still complaining of left-sided chest pain exacerbated with deep breaths and coughing.  Still having productive cough.  Denies worsening shortness of breath, fever, nausea, vomiting, abdominal pain.  Physical Exam:  Vitals:   11/14/24 0230 11/14/24 0439 11/14/24 0744 11/14/24 0834  BP:  (!) 151/56 (!) 158/62   Pulse:  79 75   Resp:  17 17   Temp:  98.5 F (36.9 C) 98.1 F (36.7 C)   TempSrc:      SpO2: 95% 95% 100% 98%  Weight:      Height:        GENERAL:  Alert, pleasant, malaise HEENT:  EOMI CARDIOVASCULAR:  RRR, no murmurs appreciated RESPIRATORY: Coarse cough with sputum production, rhonchi bilaterally GASTROINTESTINAL:  Soft, nontender, nondistended EXTREMITIES:  No LE edema bilaterally NEURO:  No new focal deficits appreciated SKIN:  No rashes noted PSYCH:  Appropriate mood and affect    Data Reviewed:  Imaging Studies: CT CHEST WO CONTRAST Result Date: 11/12/2024 CLINICAL DATA:  Pneumonia. EXAM: CT CHEST WITHOUT CONTRAST TECHNIQUE: Multidetector CT imaging of the chest was performed following the standard protocol without IV contrast. RADIATION DOSE REDUCTION: This exam was performed according to the departmental dose-optimization program which includes automated exposure control, adjustment of the mA and/or kV according to patient size and/or use of iterative reconstruction technique. COMPARISON:  09/11/2004. FINDINGS: Cardiovascular: Atherosclerotic calcification of the aorta, aortic valve and coronary arteries. Heart is at the upper limits of normal in size to mildly enlarged. Small pericardial effusion. Mediastinum/Nodes: No pathologically enlarged mediastinal or axillary lymph nodes. Hilar regions are difficult to definitively evaluate without  IV contrast. Air in the esophagus can be seen with dysmotility. Lungs/Pleura: Left lower lobe consolidation. Peribronchovascular nodularity with mucoid impaction and volume loss in the posterior segment  right upper lobe and right lower lobe. Small left pleural effusion. Airway is unremarkable. Upper Abdomen: Liver is decreased in attenuation diffusely. Cholecystectomy. Elevated left hemidiaphragm. Visualized portions of the liver, adrenal glands, left kidney, spleen, pancreas, stomach and bowel are otherwise grossly unremarkable. No upper abdominal adenopathy. Musculoskeletal: Degenerative changes in the spine. IMPRESSION: 1. Multi lobar pneumonia, worse in the left lower lobe. Followup PA and lateral chest X-ray is recommended in 3-4 weeks following trial of antibiotic therapy to ensure resolution and exclude underlying malignancy. 2. Small left pleural effusion. 3. Small pericardial effusion. 4. Hepatic steatosis. 5. Aortic atherosclerosis (ICD10-I70.0). Coronary artery calcification. Electronically Signed   By: Newell Eke M.D.   On: 11/12/2024 16:14   DG Chest 2 View Result Date: 11/12/2024 CLINICAL DATA:  Flu like symptoms x1 month with left-sided abdominal pain, cough and shortness of breath. EXAM: CHEST - 2 VIEW COMPARISON:  None Available. FINDINGS: The heart size and mediastinal contours are within normal limits. Mild to moderate severity infiltrate is seen within the left lung base. There is mild elevation of the left hemidiaphragm. A very small left pleural effusion is suspected. No pneumothorax is identified. The visualized skeletal structures are unremarkable. IMPRESSION: 1. Mild to moderate severity left basilar infiltrate. 2. Very small left pleural effusion. Electronically Signed   By: Suzen Dials M.D.   On: 11/12/2024 11:32   DG Chest 2 View Result Date: 10/21/2024 CLINICAL DATA:  Chest pain EXAM: CHEST - 2 VIEW COMPARISON:  10/06/2024 FINDINGS: The heart size and mediastinal contours are within normal limits. Both lungs are clear. The visualized skeletal structures are unremarkable. IMPRESSION: No active cardiopulmonary disease. Electronically Signed   By: Oneil Devonshire M.D.   On:  10/21/2024 03:04    There are no new results to review at this time.  Previous records (including but not limited to H&P, progress notes, nursing notes, TOC management) were reviewed in assessment of this patient.  Labs: CBC: Recent Labs  Lab 11/12/24 1057 11/13/24 0736 11/14/24 0531  WBC 22.7* 17.2* 11.9*  HGB 12.3 9.8* 9.3*  HCT 37.2 29.8* 27.8*  MCV 87.5 87.4 87.7  PLT 282 206 201   Basic Metabolic Panel: Recent Labs  Lab 11/12/24 1051 11/12/24 1145 11/13/24 0736 11/14/24 0531  NA 133*  --  133* 134*  K 3.4*  --  3.8 3.0*  CL 93*  --  101 103  CO2 26  --  24 23  GLUCOSE 211*  --  72 64*  BUN 25*  --  30* 27*  CREATININE 1.57*  --  1.25* 1.14*  CALCIUM  8.6*  --  7.6* 7.7*  MG  --  1.9  --   --    Liver Function Tests: No results for input(s): AST, ALT, ALKPHOS, BILITOT, PROT, ALBUMIN in the last 168 hours. CBG: Recent Labs  Lab 11/12/24 1830 11/12/24 2203 11/13/24 0801 11/13/24 1234 11/13/24 1704  GLUCAP 164* 98 74 105* 139*    Scheduled Meds:  acidophilus  2 capsule Oral TID   colchicine   0.6 mg Oral Daily   escitalopram   5 mg Oral Daily   fluticasone  furoate-vilanterol  1 puff Inhalation Daily   heparin   5,000 Units Subcutaneous Q12H   hydrALAZINE   10 mg Oral TID   insulin  aspart  0-9 Units Subcutaneous TID WC   insulin   glargine  10 Units Subcutaneous Daily   ipratropium-albuterol   3 mL Nebulization TID   lidocaine   1 patch Transdermal Q24H   oseltamivir   30 mg Oral BID   pantoprazole   40 mg Oral Daily   rosuvastatin   10 mg Oral Daily   Continuous Infusions:  cefTRIAXone  (ROCEPHIN )  IV 2 g (11/14/24 1033)   doxycycline  (VIBRAMYCIN ) IV 100 mg (11/14/24 1034)   PRN Meds:.acetaminophen  **OR** acetaminophen , albuterol , guaiFENesin -codeine , HYDROmorphone  (DILAUDID ) injection, ipratropium, loperamide , ondansetron  **OR** ondansetron  (ZOFRAN ) IV, senna-docusate, zolpidem   Family Communication: None at bedside  Disposition: Status is:  Inpatient Remains inpatient appropriate because: Pneumonia     Time spent: 38 minutes  Length of inpatient stay: 2 days  Author: Carliss LELON Canales, DO 11/14/2024 11:59 AM  For on call review [email or web address].   "

## 2024-11-14 NOTE — Evaluation (Signed)
 Occupational Therapy Evaluation Patient Details Name: Ashley Gross MRN: [id number] DOB: [birth year] Today's Date: 11/14/2024   History of Present Illness   Pt is a 74 y.o. female presented with worsening of cough shortness of breath and left-sided chest pain. Admitted with flu a, multifocal PNA and AKI. PMH of chronic pain diarrhea, chronic pancreatic insufficiency, IDDM, HTN, HLD, COPD, CVA     Clinical Impressions Pt was seen for OT evaluation this date. PTA, pt reports she lived alone and was indep without AD use for all tasks, driving and community mobile. Reports her daughter lives in the area, but has her own health issues. Pt presents with deficits in strength, activity tolerance and pain limiting their ability to perform ADL management at baseline level. Pt currently requires CGA/SBA for rolling in bed with frequent coughing and diarrhea. She required total assist for bed level peri-care x3 during session and Mod A x2 to reposition to [facility]. Sp02 on 2L with activity 92% and back up to 96% at rest. Pt with L sided chest/under breast pain with NT aware and to notify nurse. Pt declined OOB activity d/t continued diarrhea whenever she coughs. She is fatigued after rolling and frequent Bms.  Pt would benefit from skilled OT services to address noted impairments and functional limitations to maximize safety and independence while minimizing future risk of falls, injury, and readmission. Do anticipate the need for follow up OT services upon acute hospital DC.      If plan is discharge home, recommend the following:   A lot of help with bathing/dressing/bathroom;A lot of help with walking and/or transfers;Assistance with cooking/housework;Help with stairs or ramp for entrance     Functional Status Assessment   Patient has had a recent decline in their functional status and demonstrates the ability to make significant improvements in function in a reasonable and predictable amount of  time.     Equipment Recommendations   Other (comment) (defer to next venue)     Recommendations for Other Services         Precautions/Restrictions   Precautions Precautions: Fall Recall of Precautions/Restrictions: Intact Restrictions Weight Bearing Restrictions Per Provider Order: No     Mobility Bed Mobility Overal bed mobility: Needs Assistance Bed Mobility: Rolling Rolling: Contact guard assist, Used rails, Supervision         General bed mobility comments: rolls well to both sides with cues for initiation, able to hold in sidelying, limited by pain in chest/under L breast    Transfers                   General transfer comment: deferred d/t weakness and frequent diarrhea/pt tolerance      Balance                                           ADL either performed or assessed with clinical judgement   ADL Overall ADL's : Needs assistance/impaired                             Toileting- Clothing Manipulation and Hygiene: Total assistance;Bed level         General ADL Comments: pt with frequent diarrhea during session requiring rolling in bed for cleanup x3 at bed level with pain in pain and SOB/coughing and declined further activity     Vision  Perception         Praxis         Pertinent Vitals/Pain Pain Assessment Pain Assessment: 0-10 Pain Location: L breast/chest pain and burning buttocks Pain Descriptors / Indicators: Aching, Burning Pain Intervention(s): Monitored during session, Repositioned     Extremity/Trunk Assessment Upper Extremity Assessment Upper Extremity Assessment: Generalized weakness   Lower Extremity Assessment Lower Extremity Assessment: Generalized weakness       Communication Communication Communication: No apparent difficulties   Cognition Arousal: Alert Behavior During Therapy: WFL for tasks assessed/performed               OT - Cognition Comments: A  and O x4                         Cueing  General Comments      sp02 desat to 92% on 2L during session; NT to notify nurse of pain, IV site bleeding, and request for immodium   Exercises Other Exercises Other Exercises: Edu on role of OT in acute setting.   Shoulder Instructions      Home Living Family/patient expects to be discharged to:: Private residence Living Arrangements: Alone Available Help at Discharge: Family;Available PRN/intermittently Type of Home: House Home Access: Stairs to enter [employer] of Steps: 3-4 Entrance Stairs-Rails: Can reach both Home Layout: One level     Bathroom Shower/Tub: Chief Strategy Officer: Standard     Home Equipment: [employer] (2 wheels)          Prior Functioning/Environment Prior Level of Function : Independent/Modified Independent;Driving             Mobility Comments: no AD use, household and community distances prior to getting sick; has not driven the last month d/t being sick ADLs Comments: indep, retired in July 2025    OT Problem List: Decreased activity tolerance;Decreased strength;Pain;Impaired balance (sitting and/or standing)   OT Treatment/Interventions: Self-care/ADL training;Therapeutic exercise;Patient/family education;Balance training;Energy conservation;Therapeutic activities      OT Goals(Current goals can be found in the care plan section)   Acute Rehab OT Goals Patient Stated Goal: improve pain and diarrhea OT Goal Formulation: With patient Time For Goal Achievement: 11/28/24 Potential to Achieve Goals: Fair ADL Goals Pt Will Perform Grooming: with set-up;sitting Pt Will Perform Lower Body Dressing: with contact guard assist;sit to/from stand;sitting/lateral leans Pt Will Transfer to Toilet: with contact guard assist;with min assist;ambulating Additional ADL Goal #1: Pt will demo implementation of 1 learned ECS during ADL performance 2/2 trials to  maximize safety/indep and prevent overexertion.   OT Frequency:  Min 2X/week    Co-evaluation              AM-PAC OT 6 Clicks Daily Activity     Outcome Measure Help from another person eating meals?: A Little Help from another person taking care of personal grooming?: A Little Help from another person toileting, which includes using toliet, bedpan, or urinal?: A Lot Help from another person bathing (including washing, rinsing, drying)?: A Lot Help from another person to put on and taking off regular upper body clothing?: A Lot Help from another person to put on and taking off regular lower body clothing?: A Lot 6 Click Score: 14   End of Session Equipment Utilized During Treatment: Oxygen Nurse Communication: Mobility status  Activity Tolerance: Patient limited by fatigue Patient left: in bed;with call bell/phone within reach;with bed alarm set  OT Visit Diagnosis: Other abnormalities of gait and  mobility (R26.89);Muscle weakness (generalized) (M62.81)                Time: 1110-1135 OT Time Calculation (min): 25 min Charges:  OT General Charges $OT Visit: 1 Visit OT Evaluation $OT Eval Moderate Complexity: 1 Mod OT Treatments $Self Care/Home Management : 8-22 mins  Katrinna Travieso Chrismon, OTR/L 11/14/2024, 12:47 PM  Joell Usman E Chrismon 11/14/2024, 12:44 PM

## 2024-11-14 NOTE — Care Management Important Message (Signed)
 Important Message  Patient Details  Name: ADISYNN SULEIMAN MRN: [id number] Date of Birth: 1951-07-30   Important Message Given:  Yes - Medicare IM     Arath Kaigler W, CMA 11/14/2024, 11:34 AM

## 2024-11-15 DIAGNOSIS — J1000 Influenza due to other identified influenza virus with unspecified type of pneumonia: Secondary | ICD-10-CM

## 2024-11-15 LAB — BASIC METABOLIC PANEL WITH GFR
Anion gap: 11 (ref 5–15)
BUN: 17 mg/dL (ref 8–23)
CO2: 20 mmol/L — ABNORMAL LOW (ref 22–32)
Calcium: 7.7 mg/dL — ABNORMAL LOW (ref 8.9–10.3)
Chloride: 103 mmol/L (ref 98–111)
Creatinine, Ser: 0.85 mg/dL (ref 0.44–1.00)
GFR, Estimated: >60 mL/min
Glucose, Bld: 51 mg/dL — ABNORMAL LOW (ref 70–99)
Potassium: 3.4 mmol/L — ABNORMAL LOW (ref 3.5–5.1)
Sodium: 134 mmol/L — ABNORMAL LOW (ref 135–145)

## 2024-11-15 LAB — GLUCOSE, CAPILLARY
Glucose-Capillary: 106 mg/dL — ABNORMAL HIGH (ref 70–99)
Glucose-Capillary: 98 mg/dL (ref 70–99)
Glucose-Capillary: 110 mg/dL — ABNORMAL HIGH (ref 70–99)

## 2024-11-15 LAB — CBC
HCT: 30.6 % — ABNORMAL LOW (ref 36.0–46.0)
Hemoglobin: 10.4 g/dL — ABNORMAL LOW (ref 12.0–15.0)
MCH: 29.5 pg (ref 26.0–34.0)
MCHC: 34 g/dL (ref 30.0–36.0)
MCV: 86.7 fL (ref 80.0–100.0)
Platelets: 237 K/uL (ref 150–400)
RBC: 3.53 MIL/uL — ABNORMAL LOW (ref 3.87–5.11)
RDW: 16.2 % — ABNORMAL HIGH (ref 11.5–15.5)
WBC: 10.4 K/uL (ref 4.0–10.5)
nRBC: 0 % (ref 0.0–0.2)

## 2024-11-15 MED ORDER — POTASSIUM CHLORIDE CRYS ER 20 MEQ PO TBCR
40.0000 meq | EXTENDED_RELEASE_TABLET | Freq: Once | ORAL | Status: AC
  Administered 2024-11-15: 40 meq via ORAL
  Filled 2024-11-15: qty 2

## 2024-11-15 MED ORDER — IPRATROPIUM-ALBUTEROL 0.5-2.5 (3) MG/3ML IN SOLN
3.0000 mL | Freq: Two times a day (BID) | RESPIRATORY_TRACT | Status: DC
  Administered 2024-11-15 – 2024-11-16 (×2): 3 mL via RESPIRATORY_TRACT
  Filled 2024-11-15 (×2): qty 3

## 2024-11-15 MED ORDER — INSULIN GLARGINE 100 UNIT/ML ~~LOC~~ SOLN
5.0000 [IU] | Freq: Every day | SUBCUTANEOUS | Status: DC
  Administered 2024-11-15: 5 [IU] via SUBCUTANEOUS
  Filled 2024-11-15: qty 0.05

## 2024-11-15 NOTE — Evaluation (Signed)
 Physical Therapy Evaluation Patient Details Name: Ashley Gross MRN: [id number] DOB: Sep 26, 1951 Today's Date: 11/15/2024  History of Present Illness  Pt is a 74 y.o. female presented with worsening of cough shortness of breath and left-sided chest pain. Admitted with flu a, multifocal PNA and AKI. PMH of chronic pain diarrhea, chronic pancreatic insufficiency, IDDM, HTN, HLD, COPD, CVA  Clinical Impression  Patient received in bed sleeping. She is agreeable to PT assessment. Patient is mod I with bed mobility. She is able to stand with cga and ambulated to recliner ~ 4 feet with cga. Some unsteadiness and weakness with mobility. Patient also reporting sob with this activity. O2 sats remained in mid 90%s throughout on supplemental O2. She will continue to benefit from skilled PT to improve strength, endurance and safety with mobility.         If plan is discharge home, recommend the following: A little help with walking and/or transfers;A little help with bathing/dressing/bathroom;Help with stairs or ramp for entrance;Assist for transportation   Can travel by private vehicle    yes    Equipment Recommendations None recommended by PT  Recommendations for Other Services       Functional Status Assessment Patient has had a recent decline in their functional status and demonstrates the ability to make significant improvements in function in a reasonable and predictable amount of time.     Precautions / Restrictions Precautions Precautions: Fall Recall of Precautions/Restrictions: Intact Restrictions Weight Bearing Restrictions Per Provider Order: No      Mobility  Bed Mobility Overal bed mobility: Modified Independent, Needs Assistance Bed Mobility: Supine to Sit Rolling: Supervision         General bed mobility comments: patient able to get to edge of bed without assistance.    Transfers Overall transfer level: Needs assistance Equipment used: None Transfers: Sit  to/from Stand, Bed to chair/wheelchair/BSC Sit to Stand: Contact guard assist, Supervision           General transfer comment: patient had soiled bed and required assist clean up prior to getting into recliner ( unaware) She was able to stand with cga    Ambulation/Gait Ambulation/Gait assistance: Contact guard assist Gait Distance (Feet): 4 Feet Assistive device: None Gait Pattern/deviations: Step-to pattern, Decreased step length - right, Decreased step length - left, Decreased stride length Gait velocity: decr     General Gait Details: fatigued with ambulation short distance to recliner. SOB. O2 sats remained in the mid 90%s throughout session on supplemental O2.  Stairs            Wheelchair Mobility     Tilt Bed    Modified Rankin (Stroke Patients Only)       Balance Overall balance assessment: Needs assistance Sitting-balance support: Feet supported Sitting balance-Leahy Scale: Good     Standing balance support: During functional activity Standing balance-Leahy Scale: Fair Standing balance comment: some unsteadiness with mobility.                             Pertinent Vitals/Pain Pain Assessment Pain Assessment: Faces Pain Location: Chest/rib pain from coughing Pain Descriptors / Indicators: Aching, Burning Pain Intervention(s): Monitored during session, Repositioned    Home Living Family/patient expects to be discharged to:: Private residence Living Arrangements: Alone Available Help at Discharge: Family;Available PRN/intermittently (planning to go to daughter's home at discharge) Type of Home: House Home Access: Stairs to enter Entrance Stairs-Rails: Can reach both Entrance Stairs-Number of Steps:  3-4   Home Layout: One level Home Equipment: [employer] (2 wheels)      Prior Function Prior Level of Function : Independent/Modified Independent;Driving             Mobility Comments: no AD use, household and community  distances prior to getting sick; has not driven the last month d/t being sick ADLs Comments: indep, retired in July 2025     Extremity/Trunk Assessment   Upper Extremity Assessment Upper Extremity Assessment: Generalized weakness    Lower Extremity Assessment Lower Extremity Assessment: Generalized weakness    Cervical / Trunk Assessment Cervical / Trunk Assessment: Normal  Communication   Communication Communication: No apparent difficulties    Cognition Arousal: Alert Behavior During Therapy: WFL for tasks assessed/performed   PT - Cognitive impairments: No apparent impairments                         Following commands: Intact       Cueing Cueing Techniques: Verbal cues     General Comments      Exercises     Assessment/Plan    PT Assessment Patient needs continued PT services  PT Problem List Decreased strength;Decreased activity tolerance;Decreased balance;Decreased mobility;Cardiopulmonary status limiting activity       PT Treatment Interventions DME instruction;Gait training;Stair training;Functional mobility training;Therapeutic activities;Therapeutic exercise;Balance training;Patient/family education    PT Goals (Current goals can be found in the Care Plan section)  Acute Rehab PT Goals Patient Stated Goal: to improve PT Goal Formulation: With patient Time For Goal Achievement: 11/22/24 Potential to Achieve Goals: Good    Frequency Min 2X/week     Co-evaluation               AM-PAC PT 6 Clicks Mobility  Outcome Measure Help needed turning from your back to your side while in a flat bed without using bedrails?: None Help needed moving from lying on your back to sitting on the side of a flat bed without using bedrails?: None Help needed moving to and from a bed to a chair (including a wheelchair)?: A Little Help needed standing up from a chair using your arms (e.g., wheelchair or bedside chair)?: A Little Help needed to walk  in hospital room?: A Lot Help needed climbing 3-5 steps with a railing? : A Lot 6 Click Score: 18    End of Session Equipment Utilized During Treatment: Oxygen Activity Tolerance: Patient limited by fatigue Patient left: in chair;with call bell/phone within reach;with chair alarm set Nurse Communication: Mobility status PT Visit Diagnosis: Other abnormalities of gait and mobility (R26.89);Muscle weakness (generalized) (M62.81);Difficulty in walking, not elsewhere classified (R26.2);Unsteadiness on feet (R26.81)    Time: 8885-8866 PT Time Calculation (min) (ACUTE ONLY): 19 min   Charges:   PT Evaluation $PT Eval Low Complexity: 1 Low PT Treatments $Therapeutic Activity: 8-22 mins PT General Charges $$ ACUTE PT VISIT: 1 Visit        Kailyn Vanderslice, PT, GCS 11/15/2024,11:52 AM

## 2024-11-15 NOTE — Telephone Encounter (Signed)
 Lvm for pt to return call. Need to confirm if pt is currently taking this med or not. Okay to relay. If relayed please notify office

## 2024-11-15 NOTE — Inpatient Diabetes Management (Signed)
 Inpatient Diabetes Program Recommendations  AACE/ADA: New Consensus Statement on Inpatient Glycemic Control   Target Ranges:  Prepandial:   less than 140 mg/dL      Peak postprandial:   less than 180 mg/dL (1-2 hours)      Critically ill patients:  140 - 180 mg/dL    Latest Reference Range & Units 11/15/24 04:37  Glucose 70 - 99 mg/dL 51 (L)    Latest Reference Range & Units 11/13/24 08:01 11/13/24 12:34 11/13/24 17:04 11/14/24 12:33 11/14/24 16:40 11/14/24 21:26  Glucose-Capillary 70 - 99 mg/dL 74 894 (H) 860 (H) 99 883 (H) 150 (H)   Review of Glycemic Control  Diabetes history: DM2 Outpatient Diabetes medications: Tresiba  16 units daily Current orders for Inpatient glycemic control: Lantus  10 units daily, Novolog  0-9 units TID with meals  Inpatient Diabetes Program Recommendations:    Insulin : Lab glucose 51 mg/dl today. Please decrease Lantus  to 5 units daily.  Thanks, Earnie Gainer, RN, MSN, CDCES Diabetes Coordinator Inpatient Diabetes Program [phone] (Team Pager from 8am to 5pm)

## 2024-11-15 NOTE — Progress Notes (Signed)
" °  Progress Note   Patient: Ashley Gross FMW:[id number] DOB: 1951-07-30 DOA: 11/12/2024     3 DOS: the patient was seen and examined on 11/15/2024   Brief hospital course:   74 y.o. female with medical history significant of chronic pain diarrhea, chronic pancreatic insufficiency, IDDM, HTN, HLD, COPD, presented with worsening of cough shortness of breath and left-sided chest pain.    Assessment and Plan:   Influenza A with concern for bacterial superinfection - Multifocal pneumonia noted on imaging.  Coarse cough with thick sputum production.  Rapid swab positive for influenza A.  Procalcitonin 100.  Profound leukocytosis. Continue Tamiflu  Continue current antibiotics  Pleuritic chest pain-improving   Acute kidney injury Patient received IV fluid Monitor renal function   Normocytic anemia No indication for blood transfusion at this time    Insulin -dependent diabetes mellitus - Insulin  sliding scale on board.   Acute on chronic diarrhea - Appears to be resolved at this time.  History of pancreatic insufficiency, follows with GI in the outpatient setting.   COPD - No wheezing appreciated.  Nebulizers on board.     Subjective:  Patient still on 2 L of oxygen Did have some left-sided chest pain but improving   Physical Exam:    GENERAL:  Alert, pleasant, malaise HEENT:  EOMI CARDIOVASCULAR:  RRR, no murmurs appreciated RESPIRATORY: Coarse cough with sputum production, rhonchi bilaterally GASTROINTESTINAL:  Soft, nontender, nondistended EXTREMITIES:  No LE edema bilaterally NEURO:  No new focal deficits appreciated SKIN:  No rashes noted PSYCH:  Appropriate mood and affect      Data Reviewed:     Vitals:   11/14/24 2124 11/15/24 0336 11/15/24 0809 11/15/24 1144  BP: (!) 162/66 (!) 162/98    Pulse: (!) 108 (!) 104    Resp: 20 16    Temp: 99.5 F (37.5 C) 98.3 F (36.8 C)    TempSrc:      SpO2: 99% 97% 96% 96%  Weight:      Height:          Latest Ref  Rng & Units 11/15/2024    4:37 AM 11/14/2024    5:31 AM 11/13/2024    7:36 AM  CBC  WBC 4.0 - 10.5 K/uL 10.4  11.9  17.2   Hemoglobin 12.0 - 15.0 g/dL 89.5  9.3  9.8   Hematocrit 36.0 - 46.0 % 30.6  27.8  29.8   Platelets 150 - 400 K/uL 237  201  206        Latest Ref Rng & Units 11/15/2024    4:37 AM 11/14/2024    5:31 AM 11/13/2024    7:36 AM  BMP  Glucose 70 - 99 mg/dL 51  64  72   BUN 8 - 23 mg/dL 17  27  30    Creatinine 0.44 - 1.00 mg/dL 9.14  8.85  8.74   Sodium 135 - 145 mmol/L 134  134  133   Potassium 3.5 - 5.1 mmol/L 3.4  3.0  3.8   Chloride 98 - 111 mmol/L 103  103  101   CO2 22 - 32 mmol/L 20  23  24    Calcium  8.9 - 10.3 mg/dL 7.7  7.7  7.6      Author: Drue ONEIDA Potter, MD 11/15/2024 4:05 PM  For on call review [email or web address].  "

## 2024-11-16 DIAGNOSIS — J1000 Influenza due to other identified influenza virus with unspecified type of pneumonia: Secondary | ICD-10-CM | POA: Diagnosis not present

## 2024-11-16 LAB — BASIC METABOLIC PANEL WITH GFR
Anion gap: 9 (ref 5–15)
BUN: 13 mg/dL (ref 8–23)
CO2: 22 mmol/L (ref 22–32)
Calcium: 8 mg/dL — ABNORMAL LOW (ref 8.9–10.3)
Chloride: 103 mmol/L (ref 98–111)
Creatinine, Ser: 0.72 mg/dL (ref 0.44–1.00)
GFR, Estimated: >60 mL/min
Glucose, Bld: 58 mg/dL — ABNORMAL LOW (ref 70–99)
Potassium: 3.6 mmol/L (ref 3.5–5.1)
Sodium: 134 mmol/L — ABNORMAL LOW (ref 135–145)

## 2024-11-16 LAB — CBC WITH DIFFERENTIAL/PLATELET
Abs Immature Granulocytes: 0.09 K/uL — ABNORMAL HIGH (ref 0.00–0.07)
Basophils Absolute: 0 K/uL (ref 0.0–0.1)
Basophils Relative: 0 %
Eosinophils Absolute: 0 K/uL (ref 0.0–0.5)
Eosinophils Relative: 0 %
HCT: 31.3 % — ABNORMAL LOW (ref 36.0–46.0)
Hemoglobin: 10.3 g/dL — ABNORMAL LOW (ref 12.0–15.0)
Immature Granulocytes: 1 %
Lymphocytes Relative: 14 %
Lymphs Abs: 1.2 K/uL (ref 0.7–4.0)
MCH: 28.9 pg (ref 26.0–34.0)
MCHC: 32.9 g/dL (ref 30.0–36.0)
MCV: 87.7 fL (ref 80.0–100.0)
Monocytes Absolute: 0.7 K/uL (ref 0.1–1.0)
Monocytes Relative: 8 %
Neutro Abs: 6.7 K/uL (ref 1.7–7.7)
Neutrophils Relative %: 77 %
Platelets: 241 K/uL (ref 150–400)
RBC: 3.57 MIL/uL — ABNORMAL LOW (ref 3.87–5.11)
RDW: 16.2 % — ABNORMAL HIGH (ref 11.5–15.5)
WBC: 8.7 K/uL (ref 4.0–10.5)
nRBC: 0 % (ref 0.0–0.2)

## 2024-11-16 LAB — GLUCOSE, CAPILLARY
Glucose-Capillary: 56 mg/dL — ABNORMAL LOW (ref 70–99)
Glucose-Capillary: 179 mg/dL — ABNORMAL HIGH (ref 70–99)
Glucose-Capillary: 121 mg/dL — ABNORMAL HIGH (ref 70–99)
Glucose-Capillary: 108 mg/dL — ABNORMAL HIGH (ref 70–99)
Glucose-Capillary: 134 mg/dL — ABNORMAL HIGH (ref 70–99)

## 2024-11-16 MED ORDER — IPRATROPIUM-ALBUTEROL 0.5-2.5 (3) MG/3ML IN SOLN
3.0000 mL | Freq: Four times a day (QID) | RESPIRATORY_TRACT | Status: AC | PRN
  Filled 2024-11-16: qty 3

## 2024-11-16 MED ORDER — DEXTROSE 50 % IV SOLN
50.0000 mL | Freq: Once | INTRAVENOUS | Status: AC
  Administered 2024-11-16: 50 mL via INTRAVENOUS
  Filled 2024-11-16: qty 50

## 2024-11-16 NOTE — Inpatient Diabetes Management (Signed)
 Inpatient Diabetes Program Recommendations  AACE/ADA: New Consensus Statement on Inpatient Glycemic Control   Target Ranges:  Prepandial:   less than 140 mg/dL      Peak postprandial:   less than 180 mg/dL (1-2 hours)      Critically ill patients:  140 - 180 mg/dL    Latest Reference Range & Units 11/16/24 05:36  Glucose 70 - 99 mg/dL 58 (L)    Latest Reference Range & Units 11/13/24 08:01 11/13/24 12:34 11/13/24 17:04 11/14/24 12:33 11/14/24 16:40 11/14/24 21:26 11/15/24 11:56 11/15/24 15:50 11/15/24 21:29  Glucose-Capillary 70 - 99 mg/dL 74 894 (H) 860 (H) 99 883 (H) 150 (H) 106 (H) 98 110 (H)   Review of Glycemic Control  Diabetes history: DM2 Outpatient Diabetes medications: Tresiba  16 units daily Current orders for Inpatient glycemic control: Lantus  5 units daily, Novolog  0-9 units TID with meals   Inpatient Diabetes Program Recommendations:     Insulin : Lab glucose 58 mg/dl today. Please discontinue Lantus .  Thanks, Earnie Gainer, RN, MSN, CDCES Diabetes Coordinator Inpatient Diabetes Program [phone] (Team Pager from 8am to 5pm)

## 2024-11-16 NOTE — Progress Notes (Signed)
 Physical Therapy Treatment Patient Details Name: Ashley Gross MRN: [id number] DOB: 1951/03/02 Today's Date: 11/16/2024   History of Present Illness Pt is a 74 y.o. female presented with worsening of cough shortness of breath and left-sided chest pain. Admitted with flu a, multifocal PNA and AKI. PMH of chronic pain diarrhea, chronic pancreatic insufficiency, IDDM, HTN, HLD, COPD, CVA    PT Comments  Pt was pleasant and motivated to participate during the session and put forth good effort throughout. Pt required min A for BLE and trunk control during log roll training to address abdominal pain during standard sit to sup.  Pt required cues for proper sequencing and did report a decrease in pain using log roll technique.  Pt was generally steady with transfers from multiple height surfaces as well as with amb with a RW but presented with very poor activity tolerance with 10 feet being her self-selected max distance.  Pt's SpO2 and HR were both WNL on 2LO2/min.  Pt will benefit from continued PT services upon discharge to safely address deficits listed in patient problem list for decreased caregiver assistance and eventual return to PLOF.      If plan is discharge home, recommend the following: A little help with walking and/or transfers;A little help with bathing/dressing/bathroom;Help with stairs or ramp for entrance;Assist for transportation;Assistance with cooking/housework   Can travel by private vehicle     Yes  Equipment Recommendations  None recommended by PT    Recommendations for Other Services       Precautions / Restrictions Precautions Precautions: Fall Recall of Precautions/Restrictions: Intact Restrictions Weight Bearing Restrictions Per Provider Order: No     Mobility  Bed Mobility Overal bed mobility: Needs Assistance Bed Mobility: Supine to Sit, Rolling, Sidelying to Sit, Sit to Sidelying Rolling: Min assist Sidelying to sit: Min assist Supine to sit:  Supervision   Sit to sidelying: Min assist General bed mobility comments: Pt initially self-selected standard sit to sup to get back into bed but reported significant abdomial soreness with the effort and had poor eccentric control lowing her trunk down due to pain; educated pt on log roll sequencing and practiced getting back into sitting and then back to supine using log roll method with pt reporting decreased pain with this method; min A and cuing for sequencing required    Transfers Overall transfer level: Needs assistance Equipment used: Rolling walker (2 wheels) Transfers: Sit to/from Stand Sit to Stand: Supervision           General transfer comment: Min extra time and effort to come to standing but no physical assist needed    Ambulation/Gait Ambulation/Gait assistance: Contact guard assist Gait Distance (Feet): 10 Feet Assistive device: Rolling walker (2 wheels) Gait Pattern/deviations: Decreased step length - right, Decreased step length - left, Step-through pattern Gait velocity: decreased     General Gait Details: Slow cadence with very short bil step length but generally steady with no overt LOB; pt reported 10 feet as being her max distance due to weakness/fatigue and declined a second bout after a seated rest break   Stairs             Wheelchair Mobility     Tilt Bed    Modified Rankin (Stroke Patients Only)       Balance Overall balance assessment: Needs assistance Sitting-balance support: Feet supported Sitting balance-Leahy Scale: Good     Standing balance support: During functional activity, Bilateral upper extremity supported Standing balance-Leahy Scale: Good  Communication Communication Communication: No apparent difficulties  Cognition Arousal: Alert Behavior During Therapy: WFL for tasks assessed/performed   PT - Cognitive impairments: No apparent impairments                          Following commands: Intact      Cueing Cueing Techniques: Verbal cues  Exercises Total Joint Exercises Ankle Circles/Pumps: AROM, Strengthening, Both, 10 reps Quad Sets: Strengthening, Both, 10 reps Gluteal Sets: Strengthening, Both, 10 reps Long Arc Quad: Strengthening, AROM, Both, 10 reps Knee Flexion: AROM, Strengthening, Both, 10 reps Other Exercises Other Exercises: HEP education and review for BLE APs, QS, and GS x 10 every 1-2 hours, BLE LAQs x 10 whenever in position to perform    General Comments        Pertinent Vitals/Pain Pain Assessment Pain Assessment: No/denies pain Pain Location: No pain at rest, chest/rib pain with coughing Pain Intervention(s): Repositioned, Monitored during session    Home Living                          Prior Function            PT Goals (current goals can now be found in the care plan section) Progress towards PT goals: Progressing toward goals    Frequency    Min 2X/week      PT Plan      Co-evaluation              AM-PAC PT 6 Clicks Mobility   Outcome Measure  Help needed turning from your back to your side while in a flat bed without using bedrails?: A Little Help needed moving from lying on your back to sitting on the side of a flat bed without using bedrails?: A Little Help needed moving to and from a bed to a chair (including a wheelchair)?: A Little Help needed standing up from a chair using your arms (e.g., wheelchair or bedside chair)?: A Little Help needed to walk in hospital room?: A Little Help needed climbing 3-5 steps with a railing? : A Lot 6 Click Score: 17    End of Session Equipment Utilized During Treatment: Oxygen;Gait belt Activity Tolerance: Patient tolerated treatment well Patient left: in bed;with call bell/phone within reach;with bed alarm set Nurse Communication: Mobility status PT Visit Diagnosis: Other abnormalities of gait and mobility (R26.89);Muscle weakness  (generalized) (M62.81);Difficulty in walking, not elsewhere classified (R26.2);Unsteadiness on feet (R26.81)     Time: 8890-8860 PT Time Calculation (min) (ACUTE ONLY): 30 min  Charges:    $Therapeutic Exercise: 8-22 mins $Therapeutic Activity: 8-22 mins PT General Charges $$ ACUTE PT VISIT: 1 Visit                     D. Scott Camella Seim PT, DPT 11/16/24, 12:08 PM

## 2024-11-16 NOTE — Progress Notes (Signed)
" °  Progress Note   Patient: Ashley Gross FMW:[id number] DOB: 1951/02/21 DOA: 11/12/2024     4 DOS: the patient was seen and examined on 11/16/2024    Brief hospital course:    74 y.o. female with medical history significant of chronic pain diarrhea, chronic pancreatic insufficiency, IDDM, HTN, HLD, COPD, presented with worsening of cough shortness of breath and left-sided chest pain.    Assessment and Plan:   Influenza A with concern for bacterial superinfection - Multifocal pneumonia noted on imaging.  Coarse cough with thick sputum production.  Rapid swab positive for influenza A.  Procalcitonin 100.  Profound leukocytosis. Continue Tamiflu  Continue current antibiotics   Pleuritic chest pain-improving   Acute kidney injury Patient received IV fluid Monitor renal function   Normocytic anemia No indication for blood transfusion at this time     Insulin -dependent diabetes mellitus - Insulin  sliding scale on board.   Acute on chronic diarrhea - Appears to be resolved at this time.  History of pancreatic insufficiency, follows with GI in the outpatient setting.   COPD - No wheezing appreciated.  Nebulizers on board.     Subjective:  Patient on 2 L of intranasal oxygen Denies nausea vomiting abdominal pain   Physical Exam:    GENERAL:  Alert, pleasant, malaise HEENT:  EOMI CARDIOVASCULAR:  RRR, no murmurs appreciated RESPIRATORY: Coarse cough with sputum production, rhonchi bilaterally GASTROINTESTINAL:  Soft, nontender, nondistended EXTREMITIES:  No LE edema bilaterally NEURO:  No new focal deficits appreciated SKIN:  No rashes noted PSYCH:  Appropriate mood and affect      Data Reviewed:        Latest Ref Rng & Units 11/16/2024    5:36 AM 11/15/2024    4:37 AM 11/14/2024    5:31 AM  CBC  WBC 4.0 - 10.5 K/uL 8.7  10.4  11.9   Hemoglobin 12.0 - 15.0 g/dL 89.6  89.5  9.3   Hematocrit 36.0 - 46.0 % 31.3  30.6  27.8   Platelets 150 - 400 K/uL 241  237  201         Latest Ref Rng & Units 11/16/2024    5:36 AM 11/15/2024    4:37 AM 11/14/2024    5:31 AM  BMP  Glucose 70 - 99 mg/dL 58  51  64   BUN 8 - 23 mg/dL 13  17  27    Creatinine 0.44 - 1.00 mg/dL 9.27  9.14  8.85   Sodium 135 - 145 mmol/L 134  134  134   Potassium 3.5 - 5.1 mmol/L 3.6  3.4  3.0   Chloride 98 - 111 mmol/L 103  103  103   CO2 22 - 32 mmol/L 22  20  23    Calcium  8.9 - 10.3 mg/dL 8.0  7.7  7.7       Vitals:   11/15/24 2056 11/16/24 0452 11/16/24 0636 11/16/24 0751  BP: (!) 151/63 (!) 178/75  (!) 127/59  Pulse: (!) 101 88    Resp: 20 20  18   Temp: 99.4 F (37.4 C) 99.3 F (37.4 C)  98.6 F (37 C)  TempSrc:  Oral  Oral  SpO2: 93% 99% 98%   Weight:      Height:        Author: Drue ONEIDA Potter, MD 11/16/2024 5:10 PM  For on call review [email or web address].  "

## 2024-11-16 NOTE — Progress Notes (Signed)
 Date and time results received: 11/16/24 at 0810 (use smartphrase .now to insert current time)  Test: Blood glucose  Critical Value: BG 56  Name of Provider Notified: Dr Dorinda   Orders Received? Or Actions Taken?:   To give 50% Dextrose  injection through IV and IM lantus  order was discontinue

## 2024-11-16 NOTE — Progress Notes (Signed)
 Occupational Therapy Treatment Patient Details Name: Ashley Gross MRN: [id number] DOB: [birth year] Today's Date: 11/16/2024   History of present illness Pt is a 74 y.o. female presented with worsening of cough shortness of breath and left-sided chest pain. Admitted with flu a, multifocal PNA and AKI. PMH of chronic pain diarrhea, chronic pancreatic insufficiency, IDDM, HTN, HLD, COPD, CVA   OT comments  Pt is supine in bed on arrival. Easily arousable and agreeable to OT session. She continues to have L sided chest pain when coughing. Edu on use of pillow to brace herself, which seemed to help some during session. Bed mobility and STS performed with SBA and increased time/effort. Functional mobility/ADL transfer from bed>bathroom using RW for toileting tasks and hand hygiene with CGA. Pt with desat to 93% sp02 on 2L with activity and HR up to 118 with pt notably fatigued with minimal activity. She required standing rest break to ambulate ~12 ft  from bathroom to recliner in room d/t fatigue/weakness. Pt left seated in recliner with all needs in place and will cont to require skilled acute OT services to maximize her safety and IND to return to PLOF.        If plan is discharge home, recommend the following:  Assistance with cooking/housework;Help with stairs or ramp for entrance;A little help with bathing/dressing/bathroom;A little help with walking and/or transfers   Equipment Recommendations  Other (comment) (defer to next venue)    Recommendations for Other Services      Precautions / Restrictions Precautions Precautions: Fall Recall of Precautions/Restrictions: Intact Restrictions Weight Bearing Restrictions Per Provider Order: No       Mobility Bed Mobility Overal bed mobility: Needs Assistance Bed Mobility: Supine to Sit     Supine to sit: Supervision     General bed mobility comments: no assist to reach EOB, increased time/effort    Transfers Overall transfer  level: Needs assistance Equipment used: Rolling walker (2 wheels) Transfers: Sit to/from Stand Sit to Stand: Supervision           General transfer comment: increased time/effort to stand, ambulates with CGA to bathroom and around bed to recliner; fatigues very easily with minimal activity     Balance Overall balance assessment: Needs assistance Sitting-balance support: Feet supported Sitting balance-Leahy Scale: Good     Standing balance support: During functional activity, Bilateral upper extremity supported Standing balance-Leahy Scale: Good                             ADL either performed or assessed with clinical judgement   ADL Overall ADL's : Needs assistance/impaired     Grooming: Wash/dry hands;Standing;Contact guard assist                   Toilet Transfer: Contact guard assist;Comfort height toilet;Rolling walker (2 wheels);Ambulation   Toileting- Clothing Manipulation and Hygiene: Contact guard assist;Sit to/from stand;Minimal assistance Toileting - Clothing Manipulation Details (indicate cue type and reason): after cont BM and urine on toilet     Functional mobility during ADLs: Rolling walker (2 wheels);Contact guard assist General ADL Comments: continues to be limited by pain and fatigue    Extremity/Trunk Assessment              Vision       Perception     Praxis     Communication Communication Communication: No apparent difficulties   Cognition Arousal: Alert Behavior During Therapy: [facility] for tasks assessed/performed  Following commands: Intact        Cueing   Cueing Techniques: Verbal cues  Exercises      Shoulder Instructions       General Comments      Pertinent Vitals/ Pain       Pain Assessment Pain Assessment: Faces Faces Pain Scale: Hurts a little bit Pain Location: chest rib/pain with coughing; none at rest Pain Descriptors / Indicators: Aching,  Sore Pain Intervention(s): Monitored during session, Repositioned  Home Living                                          Prior Functioning/Environment              Frequency  Min 2X/week        Progress Toward Goals  OT Goals(current goals can now be found in the care plan section)  Progress towards OT goals: Progressing toward goals  Acute Rehab OT Goals Patient Stated Goal: feel better OT Goal Formulation: With patient Time For Goal Achievement: 11/28/24 Potential to Achieve Goals: Fair  Plan      Co-evaluation                 AM-PAC OT 6 Clicks Daily Activity     Outcome Measure   Help from another person eating meals?: A Little Help from another person taking care of personal grooming?: A Little Help from another person toileting, which includes using toliet, bedpan, or urinal?: A Little Help from another person bathing (including washing, rinsing, drying)?: A Lot Help from another person to put on and taking off regular upper body clothing?: A Little Help from another person to put on and taking off regular lower body clothing?: A Lot 6 Click Score: 16    End of Session Equipment Utilized During Treatment: Oxygen;Rolling walker (2 wheels)  OT Visit Diagnosis: Other abnormalities of gait and mobility (R26.89);Muscle weakness (generalized) (M62.81)   Activity Tolerance Patient limited by fatigue;Patient tolerated treatment well   Patient Left with call bell/phone within reach;in chair;with chair alarm set   Nurse Communication Mobility status        Time: 9065-9045 OT Time Calculation (min): 20 min  Charges: OT General Charges $OT Visit: 1 Visit OT Treatments $Self Care/Home Management : 8-22 mins  Kimia Finan Chrismon, OTR/L  11/16/24, 1:35 PM   Megyn Leng E Chrismon 11/16/2024, 1:31 PM

## 2024-11-17 DIAGNOSIS — J1000 Influenza due to other identified influenza virus with unspecified type of pneumonia: Secondary | ICD-10-CM | POA: Diagnosis not present

## 2024-11-17 LAB — BASIC METABOLIC PANEL WITH GFR
Anion gap: 8 (ref 5–15)
BUN: 14 mg/dL (ref 8–23)
CO2: 22 mmol/L (ref 22–32)
Calcium: 7.7 mg/dL — ABNORMAL LOW (ref 8.9–10.3)
Chloride: 103 mmol/L (ref 98–111)
Creatinine, Ser: 0.73 mg/dL (ref 0.44–1.00)
GFR, Estimated: >60 mL/min
Glucose, Bld: 109 mg/dL — ABNORMAL HIGH (ref 70–99)
Potassium: 3.9 mmol/L (ref 3.5–5.1)
Sodium: 134 mmol/L — ABNORMAL LOW (ref 135–145)

## 2024-11-17 LAB — CBC WITH DIFFERENTIAL/PLATELET
Abs Immature Granulocytes: 0.08 K/uL — ABNORMAL HIGH (ref 0.00–0.07)
Basophils Absolute: 0 K/uL (ref 0.0–0.1)
Basophils Relative: 0 %
Eosinophils Absolute: 0 K/uL (ref 0.0–0.5)
Eosinophils Relative: 0 %
HCT: 31.5 % — ABNORMAL LOW (ref 36.0–46.0)
Hemoglobin: 10.5 g/dL — ABNORMAL LOW (ref 12.0–15.0)
Immature Granulocytes: 1 %
Lymphocytes Relative: 13 %
Lymphs Abs: 1 K/uL (ref 0.7–4.0)
MCH: 28.9 pg (ref 26.0–34.0)
MCHC: 33.3 g/dL (ref 30.0–36.0)
MCV: 86.8 fL (ref 80.0–100.0)
Monocytes Absolute: 0.5 K/uL (ref 0.1–1.0)
Monocytes Relative: 7 %
Neutro Abs: 6.5 K/uL (ref 1.7–7.7)
Neutrophils Relative %: 79 %
Platelets: 253 K/uL (ref 150–400)
RBC: 3.63 MIL/uL — ABNORMAL LOW (ref 3.87–5.11)
RDW: 16 % — ABNORMAL HIGH (ref 11.5–15.5)
WBC: 8.1 K/uL (ref 4.0–10.5)
nRBC: 0 % (ref 0.0–0.2)

## 2024-11-17 LAB — GLUCOSE, CAPILLARY
Glucose-Capillary: 106 mg/dL — ABNORMAL HIGH (ref 70–99)
Glucose-Capillary: 139 mg/dL — ABNORMAL HIGH (ref 70–99)
Glucose-Capillary: 105 mg/dL — ABNORMAL HIGH (ref 70–99)
Glucose-Capillary: 108 mg/dL — ABNORMAL HIGH (ref 70–99)

## 2024-11-17 LAB — CULTURE, BLOOD (ROUTINE X 2)
Culture: NO GROWTH
Culture: NO GROWTH
Special Requests: ADEQUATE
Special Requests: ADEQUATE

## 2024-11-17 NOTE — Progress Notes (Signed)
 Physical Therapy Treatment Patient Details Name: Ashley Gross MRN: [id number] DOB: 1951-03-12 Today's Date: 11/17/2024   History of Present Illness Pt is a 74 y.o. female presented with worsening of cough shortness of breath and left-sided chest pain. Admitted with flu a, multifocal PNA and AKI. PMH of chronic pain diarrhea, chronic pancreatic insufficiency, IDDM, HTN, HLD, COPD, CVA    PT Comments  Pt was pleasant and motivated to participate during the session and put forth good effort throughout. Pt found on 2LO2/min with SpO2 in the upper 90s measured frequently including pre and post ambulation with HR WNL throughout.  Pt required no physical assistance with functional tasks this session needing only min cuing for general sequencing with transfers and gait.  Pt ambulated with very slow, effortful cadence with 15 feet being her max self-selected distance before fatiguing and needing to return to sitting but was able to perform a second bout of amb after resting this session which she was unable to do during the previous session.  Pt will benefit from continued PT services upon discharge to safely address deficits listed in patient problem list for decreased caregiver assistance and eventual return to PLOF.      If plan is discharge home, recommend the following: A little help with walking and/or transfers;A little help with bathing/dressing/bathroom;Help with stairs or ramp for entrance;Assist for transportation;Assistance with cooking/housework   Can travel by private vehicle     Yes  Equipment Recommendations  None recommended by PT    Recommendations for Other Services       Precautions / Restrictions Precautions Precautions: Fall Recall of Precautions/Restrictions: Intact Restrictions Weight Bearing Restrictions Per Provider Order: No     Mobility  Bed Mobility Overal bed mobility: Needs Assistance Bed Mobility: Supine to Sit     Supine to sit: Supervision      General bed mobility comments: Increased time and effort but no physical assist needed    Transfers Overall transfer level: Needs assistance Equipment used: Rolling walker (2 wheels) Transfers: Sit to/from Stand Sit to Stand: Supervision           General transfer comment: Min increased time and effort and use of BUEs to come to standing    Ambulation/Gait Ambulation/Gait assistance: Contact guard assist Gait Distance (Feet): 15 Feet x 2 Assistive device: Rolling walker (2 wheels) Gait Pattern/deviations: Decreased step length - right, Decreased step length - left, Step-through pattern Gait velocity: decreased     General Gait Details: Slow cadence with very short bil step length but generally steady with no overt LOB; Pt able to perform 2 bouts of ambulation this session with short seated therapeutic rest break   Stairs             Wheelchair Mobility     Tilt Bed    Modified Rankin (Stroke Patients Only)       Balance Overall balance assessment: Needs assistance Sitting-balance support: Feet supported Sitting balance-Leahy Scale: Good     Standing balance support: During functional activity, Bilateral upper extremity supported, Reliant on assistive device for balance Standing balance-Leahy Scale: Good                              Communication Communication Communication: No apparent difficulties  Cognition Arousal: Alert Behavior During Therapy: WFL for tasks assessed/performed   PT - Cognitive impairments: No apparent impairments  Following commands: Intact      Cueing Cueing Techniques: Verbal cues  Exercises Total Joint Exercises Long Arc Quad: Strengthening, AROM, Both, 10 reps Knee Flexion: AROM, Strengthening, Both, 10 reps    General Comments        Pertinent Vitals/Pain Pain Assessment Pain Assessment: No/denies pain Pain Location: chest rib/pain with coughing, no pain at  rest Pain Descriptors / Indicators: Aching, Sore Pain Intervention(s): Monitored during session    Home Living                          Prior Function            PT Goals (current goals can now be found in the care plan section) Progress towards PT goals: Progressing toward goals    Frequency    Min 2X/week      PT Plan      Co-evaluation              AM-PAC PT 6 Clicks Mobility   Outcome Measure  Help needed turning from your back to your side while in a flat bed without using bedrails?: A Little Help needed moving from lying on your back to sitting on the side of a flat bed without using bedrails?: A Little Help needed moving to and from a bed to a chair (including a wheelchair)?: A Little Help needed standing up from a chair using your arms (e.g., wheelchair or bedside chair)?: A Little Help needed to walk in hospital room?: A Little Help needed climbing 3-5 steps with a railing? : A Lot 6 Click Score: 17    End of Session Equipment Utilized During Treatment: Oxygen;Gait belt Activity Tolerance: Patient tolerated treatment well Patient left: Other (comment) (Pt left in bathroom for BM with CNA present) Nurse Communication: Mobility status PT Visit Diagnosis: Other abnormalities of gait and mobility (R26.89);Muscle weakness (generalized) (M62.81);Difficulty in walking, not elsewhere classified (R26.2);Unsteadiness on feet (R26.81)     Time: 8664-8641 PT Time Calculation (min) (ACUTE ONLY): 23 min  Charges:    $Gait Training: 8-22 mins $Therapeutic Activity: 8-22 mins PT General Charges $$ ACUTE PT VISIT: 1 Visit                     D. Scott Adaiah Morken PT, DPT 11/17/24, 2:55 PM

## 2024-11-17 NOTE — [place] FL2 (Signed)
 " [place]  MEDICAID FL2 LEVEL OF CARE FORM     IDENTIFICATION  Patient Name: Ashley Gross Birthdate: 1951/01/19 Sex: female Admission Date (Current Location): 11/12/2024  [facility] and [place] Number:  Chiropodist and Address:         Provider Number: [id number]  Attending Physician Name and Address:  Dorinda Drue DASEN, MD  Relative Name and Phone Number:       Current Level of Care: Hospital Recommended Level of Care: Skilled Nursing Facility Prior Approval Number:    Date Approved/Denied:   PASRR Number: [id number]  Discharge Plan: SNF    Current Diagnoses: Patient Active Problem List   Diagnosis Date Noted   Pneumonia due to influenza A virus 11/13/2024   Bacterial pneumonia 11/13/2024   AKI (acute kidney injury) 11/13/2024   Pleuritic chest pain 11/13/2024   Pneumonia of left lower lobe due to infectious organism 11/13/2024   CAP (community acquired pneumonia) 11/12/2024   Sepsis (HCC) 11/12/2024   Moderately severe major depression (HCC) 10/29/2024   Diarrhea 02/06/2024   Hypokalemia 12/18/2023   Fever 12/03/2023   Sore throat 12/03/2023   Murmur 09/12/2023   Back pain 09/12/2023   Dizziness 07/19/2023   Allergic rhinitis 07/19/2023   Iron deficiency anemia 06/07/2023   Severe obstructive sleep apnea 05/10/2023   Insomnia 05/10/2023   Daytime somnolence 03/30/2023   Perianal excoriation 02/26/2023   Asthma exacerbation 02/20/2023   Cough 02/15/2023   Urinary frequency 12/28/2022   Aneurysm 11/28/2022   Type 2 diabetes mellitus with diabetic nephropathy, without long-term current use of insulin  (HCC) 11/28/2022   History of CVA (cerebrovascular accident) 05/30/2022   Anemia 01/25/2022   Left inguinal hernia    Meningioma (HCC) 11/05/2021   Inguinal hernia 10/31/2021   Pituitary lesion 10/31/2021   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   Ear fullness 05/25/2021   Skin lesion 03/15/2021   Sleep  difficulties 06/08/2020   Swelling of lower extremity 11/26/2019   Healthcare maintenance 09/13/2019   Sinus congestion 02/11/2019   Asthma 12/28/2018   Vitamin D  deficiency 11/01/2018   Tendinitis of upper biceps tendon of left shoulder 09/20/2018   Depression, major, single episode, mild 09/15/2018   Mild intermittent asthma without complication 09/15/2018   Type 2 diabetes mellitus with hyperglycemia (HCC) 09/15/2018   Depression 09/15/2018   Insulin  dependent type 2 diabetes mellitus (HCC) 09/15/2018   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   Traumatic incomplete tear of left rotator cuff 09/09/2018   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   Low back pain 03/29/2016   Acute sinusitis 12/19/2015   Stress 12/12/2014   Inflammatory polyps of colon (HCC) 02/16/2013   History of colonic polyps 02/10/2013   Hypertension 10/06/2012   GERD (gastroesophageal reflux disease) 10/06/2012   Hypercholesterolemia 10/06/2012   Elevated alkaline phosphatase level 06/29/2012   Psoriatic arthritis (HCC) 06/29/2012    Orientation RESPIRATION BLADDER Height & Weight     Self, Time, Situation, Place  O2 (2L [place]) Incontinent Weight: 62.6 kg Height:  5' 4 (162.6 cm)  BEHAVIORAL SYMPTOMS/MOOD NEUROLOGICAL BOWEL NUTRITION STATUS      Incontinent Diet (heart healthy carb modified)  AMBULATORY STATUS COMMUNICATION OF NEEDS Skin   Limited Assist Verbally Skin abrasions, Bruising                       Personal Care Assistance Level of Assistance  Functional Limitations Info             SPECIAL CARE FACTORS FREQUENCY  PT (By licensed PT), OT (By licensed OT)                    Contractures Contractures Info: Not present    Additional Factors Info  Code Status, Allergies, Isolation Precautions Code Status Info: Full Allergies Info: Aspirin , Beta Adrenergic Blockers, Cobalamin Combinations, Lisinopril , Mtx Support (Cobalamine Combinations), Nsaids,  Vasotec (Enalapril), Verapamil , Voltaren (Diclofenac Sodium), Erythromycin , Indocin (Indomethacin), Jardiance  (Empagliflozin )     Isolation Precautions Info: Droplet     Current Medications (11/17/2024):  This is the current hospital active medication list Current Facility-Administered Medications  Medication Dose Route Frequency Provider Last Rate Last Admin   acetaminophen  (TYLENOL ) tablet 650 mg  650 mg Oral Q6H PRN Laurita Manor T, MD   650 mg at 11/17/24 1026   Or   acetaminophen  (TYLENOL ) suppository 650 mg  650 mg Rectal Q6H PRN Laurita Manor T, MD       acidophilus (RISAQUAD) capsule 2 capsule  2 capsule Oral TID Laurita Manor T, MD   2 capsule at 11/17/24 0945   albuterol  (PROVENTIL ) (2.5 MG/3ML) 0.083% nebulizer solution 3 mL  3 mL Inhalation Q6H PRN Laurita Manor T, MD   3 mL at 11/17/24 0818   cefTRIAXone  (ROCEPHIN ) 2 g in sodium chloride  0.9 % 100 mL IVPB  2 g Intravenous Q24H Arlon Honey W, DO 200 mL/hr at 11/17/24 1055 2 g at 11/17/24 1055   doxycycline  (VIBRA -TABS) tablet 100 mg  100 mg Oral Q12H Zeigler, Dustin G, RPH   100 mg at 11/17/24 0946   escitalopram  (LEXAPRO ) tablet 5 mg  5 mg Oral Daily Laurita Manor T, MD   5 mg at 11/17/24 0946   fluticasone  furoate-vilanterol (BREO ELLIPTA ) 100-25 MCG/ACT 1 puff  1 puff Inhalation Daily Laurita Manor T, MD   1 puff at 11/17/24 0810   guaiFENesin -codeine  100-10 MG/5ML solution 5 mL  5 mL Oral Q6H PRN Arlon Honey ORN, DO   5 mL at 11/17/24 0810   heparin  injection 5,000 Units  5,000 Units Subcutaneous Q12H Laurita Manor T, MD   5,000 Units at 11/17/24 0946   hydrALAZINE  (APRESOLINE ) tablet 10 mg  10 mg Oral TID Laurita Manor T, MD   10 mg at 11/17/24 0946   HYDROmorphone  (DILAUDID ) injection 0.5 mg  0.5 mg Intravenous Q4H PRN Laurita Manor T, MD   0.5 mg at 11/16/24 1045   insulin  aspart (novoLOG ) injection 0-9 Units  0-9 Units Subcutaneous TID WC Laurita Manor T, MD   1 Units at 11/17/24 1231   ipratropium (ATROVENT ) 0.03 % nasal spray 2 spray  2  spray Each Nare Q12H PRN Laurita Manor DASEN, MD       ipratropium-albuterol  (DUONEB) 0.5-2.5 (3) MG/3ML nebulizer solution 3 mL  3 mL Nebulization Q6H PRN Dorinda Drue DASEN, MD       lidocaine  (LIDODERM ) 5 % 1 patch  1 patch Transdermal Q24H Laurita Manor T, MD   1 patch at 11/16/24 1457   loperamide  (IMODIUM ) capsule 2 mg  2 mg Oral PRN Zhang, Ping T, MD   2 mg at 11/15/24 1310   ondansetron  (ZOFRAN ) tablet 4 mg  4 mg Oral Q6H PRN Laurita Manor T, MD       Or   ondansetron  (ZOFRAN ) injection 4 mg  4 mg Intravenous Q6H PRN Laurita Manor T, MD   4 mg at 11/12/24 1650  oseltamivir  (TAMIFLU ) capsule 30 mg  30 mg Oral BID Hunt, Madison H, RPH   30 mg at 11/17/24 9053   pantoprazole  (PROTONIX ) EC tablet 40 mg  40 mg Oral Daily Zhang, Ping T, MD   40 mg at 11/17/24 9053   rosuvastatin  (CRESTOR ) tablet 10 mg  10 mg Oral Daily Laurita Manor T, MD   10 mg at 11/17/24 0946   senna-docusate (Senokot-S) tablet 1 tablet  1 tablet Oral QHS PRN Laurita Manor DASEN, MD       zolpidem  (AMBIEN ) tablet 5 mg  5 mg Oral QHS PRN Laurita Manor DASEN, MD   5 mg at 11/16/24 2211     Discharge Medications: Please see discharge summary for a list of discharge medications.  Relevant Imaging Results:  Relevant Lab Results:   Additional Information ss [id number]  Corean DASEN Haddock, RN     "

## 2024-11-17 NOTE — Discharge Instructions (Signed)
 Rent/Utility/Housing  Agency Name: [facility] Agency Address: [address] [place], [place] [address] Phone: [phone] Email: [email or web address] Website: [email or web address] Service(s) Offered: Housing services, self-sufficiency, congregate meal program, weatherization program, Field seismologist program, emergency food assistance,  housing counseling, home ownership program, wheels -towork program.  Agency Name: [facility] Address: [address] [place], [place] [address] Phone: [phone] (8a-4p) [phone] (8p- 10p) Email: [email or web address] Website: [email or web address] Service(s) Offered: A program for homeless and/or needy men that includes one-on-one counseling, life skills training and job rehabilitation.  Agency Name: [facility] [place] Address: [address] [place], [place] [address] Phone: [phone] Website: www.[email or web address] Service(s) Offered: Assistance to needy in emergency with utility bills, heating fuel, and prescriptions. Shelter for homeless 7pm-7am. February 25, 2017 15  Agency Name: [facility] [place] [facility] Address: [address], [place], [place] [address] Phone: [phone] Contact Person: Cathleen Corti Email: [email or web address] Website: [email or web address] Service(s) Offered: Helps individuals with developmental disabilities move from housing that is more restrictive to homes where they  can achieve greater independence and have more  opportunities.  Agency Name: [facility] Address: [address], [place], [place] [address] Phone: [phone] Email: [email or web address] Website: [email or web address].org Service(s) Offered: Provides affordable housing for low-income families, elderly, and disabled individuals. Offer a wide range of  programs and services, from financial planning to  afterschool and summer programs.  Agency Name: [facility] Address: [address] [place], [place] [address] Phone: [phone] Service(s) Offered: Child support services; child welfare services; food stamps; Medicaid; work first family assistance; and aid with fuel,  rent, food and medicine.  Agency Name: [facility] [place] [employer] Address: [facility] [address], [place], [place]  [address] Phone: [phone] Website: www.familyabuseservices.org Service(s) Offered: 24 hour Crisis Line: [phone]; 24 hour Emergency Shelter; Transitional Housing; Support Groups; Scientist, physiological; [employer]; Hispanic Outreach: [phone];  Visitation Center: [phone].  Agency Name: [facility] [place]. Address: [address] [place], [place] [address] Phone: [phone] Service(s) Offered: CAP Services; Home and [employer]; Individual or Group Supports; Respite Care Non-Institutional Nursing;  Residential Supports; Respite Care and Personal Care Services; Transportation; Family and Friends Night; Recreational Activities; Three Nutritious Meals/Snacks; Consultation with Registered Dietician; Twenty-four hour Registered Nurse Access; Daily and [employer]; Camp [employer] Leaves; [place] for the [employer] (During Summer Months) Bingo Night (Every  Wednesday Night); Special Populations Dance Night  (Every Tuesday Night); Professional Hair Care Services.  Agency Name: [facility] Address: [address], [place], [place] [address] Phone: [phone] Contact Person: Jabier Mutton Website: [email or web address].Physicist, medical) Offered: Residential treatment facility for women; food and  clothing, educational & employment development and  transportation to work; Counsellor of financial skills;  parenting and family reunification; emotional and spiritual  support;  transitional housing for program graduates.  Agency Name: [facility] Address: [address] [place], [place] [address] Phone: [phone] Email: [email or web address] Website: [email or web address].es Service(s) Offered: Public housing units for elderly, disabled, and low income people; housing choice vouchers for income eligible  applicants; shelter plus care vouchers; and Psychologist, clinical.  Agency Name: [facility] [employer] Address: [address] [place], [place] [address] Phone: [phone] Email: [email or web address] Website: www.habitatalamance.org Service(s) Offered: Build houses for families in need of decent housing. Each adult in the family must invest 200 hours of labor on  someone else's house, work with volunteers to build their own house, attend classes  on budgeting, home maintenance, yard care, and attend homeowner association meetings.  Agency Name: [facility] Address: [address], [place], [place] [address] Phone: [phone] Website: www.rsli.org Service(s) Offered: Intermediate care facilities for intellectually delayed, Supervised Living in group homes for adults with developmental disabilities, Supervised Living for people who have dual diagnoses (MRMI), Independent Living, Supported Living, respite and a variety of CAP services, pre-vocational services, day supports, and [employer].  Agency Name: [place] [employer] Phone: [phone] Website: www.NCForeclosurePrevention.gov Service(s) Offered: Zero-interest, deferred loans to homeowners struggling to pay their mortgage. Call for more information.

## 2024-11-17 NOTE — Progress Notes (Signed)
" °  Progress Note   Patient: Ashley Gross FMW:[id number] DOB: 13-Sep-1951 DOA: 11/12/2024     5 DOS: the patient was seen and examined on 11/17/2024    Brief hospital course:    74 y.o. female with medical history significant of chronic pain diarrhea, chronic pancreatic insufficiency, IDDM, HTN, HLD, COPD, presented with worsening of cough shortness of breath and left-sided chest pain.    Assessment and Plan:   Influenza A with concern for bacterial superinfection - Multifocal pneumonia noted on imaging.  Coarse cough with thick sputum production.  Rapid swab positive for influenza A.  Procalcitonin 100.  Profound leukocytosis. Continue Tamiflu  Continue current antibiotics   Pleuritic chest pain-improving   Acute kidney injury Patient received IV fluid Monitor renal function   Normocytic anemia No indication for blood transfusion at this time     Insulin -dependent diabetes mellitus - Insulin  sliding scale on board.   Acute on chronic diarrhea - Appears to be resolved at this time.  History of pancreatic insufficiency, follows with GI in the outpatient setting.   COPD - No wheezing appreciated.  Nebulizers on board.     Subjective:  Patient on 2 L of intranasal oxygen Denies nausea vomiting abdominal pain Did complain of some headache   Physical Exam:    GENERAL:  Alert, pleasant, malaise HEENT:  EOMI CARDIOVASCULAR:  RRR, no murmurs appreciated RESPIRATORY: Coarse cough with sputum production, rhonchi bilaterally GASTROINTESTINAL:  Soft, nontender, nondistended EXTREMITIES:  No LE edema bilaterally NEURO:  No new focal deficits appreciated SKIN:  No rashes noted PSYCH:  Appropriate mood and affect      Data Reviewed:   Vitals:   11/17/24 0803 11/17/24 0820 11/17/24 1451 11/17/24 1611  BP: (!) 167/76   (!) 140/73  Pulse: (!) 106   93  Resp: 18   17  Temp: 99.4 F (37.4 C)   98.2 F (36.8 C)  TempSrc:      SpO2: 90% 92% 93% 94%  Weight:      Height:           Latest Ref Rng & Units 11/17/2024    5:18 AM 11/16/2024    5:36 AM 11/15/2024    4:37 AM  CBC  WBC 4.0 - 10.5 K/uL 8.1  8.7  10.4   Hemoglobin 12.0 - 15.0 g/dL 89.4  89.6  89.5   Hematocrit 36.0 - 46.0 % 31.5  31.3  30.6   Platelets 150 - 400 K/uL 253  241  237        Latest Ref Rng & Units 11/17/2024    5:18 AM 11/16/2024    5:36 AM 11/15/2024    4:37 AM  BMP  Glucose 70 - 99 mg/dL 890  58  51   BUN 8 - 23 mg/dL 14  13  17    Creatinine 0.44 - 1.00 mg/dL 9.26  9.27  9.14   Sodium 135 - 145 mmol/L 134  134  134   Potassium 3.5 - 5.1 mmol/L 3.9  3.6  3.4   Chloride 98 - 111 mmol/L 103  103  103   CO2 22 - 32 mmol/L 22  22  20    Calcium  8.9 - 10.3 mg/dL 7.7  8.0  7.7      Author: Drue ONEIDA Potter, MD 11/17/2024 5:43 PM  For on call review [email or web address]  "

## 2024-11-18 DIAGNOSIS — J1000 Influenza due to other identified influenza virus with unspecified type of pneumonia: Secondary | ICD-10-CM | POA: Diagnosis not present

## 2024-11-18 LAB — GLUCOSE, CAPILLARY
Glucose-Capillary: 142 mg/dL — ABNORMAL HIGH (ref 70–99)
Glucose-Capillary: 126 mg/dL — ABNORMAL HIGH (ref 70–99)
Glucose-Capillary: 126 mg/dL — ABNORMAL HIGH (ref 70–99)
Glucose-Capillary: 134 mg/dL — ABNORMAL HIGH (ref 70–99)

## 2024-11-18 NOTE — Progress Notes (Addendum)
" °  Progress Note   Patient: Ashley Gross FMW:[id number] DOB: [birth year] DOA: 11/12/2024     6 DOS: the patient was seen and examined on 11/18/2024     Brief hospital course:    74 y.o. female with medical history significant of chronic pain diarrhea, chronic pancreatic insufficiency, IDDM, HTN, HLD, COPD, presented with worsening of cough shortness of breath and left-sided chest pain.    Assessment and Plan:   Influenza A with concern for bacterial superinfection - Multifocal pneumonia noted on imaging.  Coarse cough with thick sputum production.  Rapid swab positive for influenza A.  Procalcitonin 100.  Profound leukocytosis. Continue Tamiflu  Continue current antibiotics   Pleuritic chest pain-improving   Acute kidney injury Patient received IV fluid Monitor renal function   Normocytic anemia No indication for blood transfusion at this time     Insulin -dependent diabetes mellitus - Insulin  sliding scale on board.   Acute on chronic diarrhea - Appears to be resolved at this time.  History of pancreatic insufficiency, follows with GI in the outpatient setting.   COPD - No wheezing appreciated.  Nebulizers on board.     Subjective:  Patient on 2 L of intranasal oxygen Denies nausea vomiting abdominal pain Headache has resolved   Physical Exam:    GENERAL:  Alert, pleasant, malaise HEENT:  EOMI CARDIOVASCULAR:  RRR, no murmurs appreciated RESPIRATORY: Coarse cough with sputum production, rhonchi bilaterally GASTROINTESTINAL:  Soft, nontender, nondistended EXTREMITIES:  No LE edema bilaterally NEURO:  No new focal deficits appreciated SKIN:  No rashes noted PSYCH:  Appropriate mood and affect      Data Reviewed:  Vitals:   11/17/24 1611 11/17/24 2033 11/18/24 0458 11/18/24 0907  BP: (!) 140/73 (!) 169/76 (!) 149/74 (!) 155/65  Pulse: 93 99 98 (!) 105  Resp: 17 16 16 20   Temp: 98.2 F (36.8 C) 98.9 F (37.2 C) 100.1 F (37.8 C) 99.3 F (37.4 C)   TempSrc:   Oral Oral  SpO2: 94% 99% 97% 96%  Weight:      Height:          Latest Ref Rng & Units 11/17/2024    5:18 AM 11/16/2024    5:36 AM 11/15/2024    4:37 AM  CBC  WBC 4.0 - 10.5 K/uL 8.1  8.7  10.4   Hemoglobin 12.0 - 15.0 g/dL 89.4  89.6  89.5   Hematocrit 36.0 - 46.0 % 31.5  31.3  30.6   Platelets 150 - 400 K/uL 253  241  237        Latest Ref Rng & Units 11/17/2024    5:18 AM 11/16/2024    5:36 AM 11/15/2024    4:37 AM  BMP  Glucose 70 - 99 mg/dL 890  58  51   BUN 8 - 23 mg/dL 14  13  17    Creatinine 0.44 - 1.00 mg/dL 9.26  9.27  9.14   Sodium 135 - 145 mmol/L 134  134  134   Potassium 3.5 - 5.1 mmol/L 3.9  3.6  3.4   Chloride 98 - 111 mmol/L 103  103  103   CO2 22 - 32 mmol/L 22  22  20    Calcium  8.9 - 10.3 mg/dL 7.7  8.0  7.7     Disposition: Pending placement  Author: Drue ONEIDA Potter, MD 11/18/2024 2:13 PM  For on call review [email or web address]  "

## 2024-11-19 DIAGNOSIS — J1000 Influenza due to other identified influenza virus with unspecified type of pneumonia: Secondary | ICD-10-CM | POA: Diagnosis not present

## 2024-11-19 LAB — GLUCOSE, CAPILLARY
Glucose-Capillary: 113 mg/dL — ABNORMAL HIGH (ref 70–99)
Glucose-Capillary: 168 mg/dL — ABNORMAL HIGH (ref 70–99)
Glucose-Capillary: 89 mg/dL (ref 70–99)
Glucose-Capillary: 134 mg/dL — ABNORMAL HIGH (ref 70–99)

## 2024-11-19 MED ORDER — DM-GUAIFENESIN ER 30-600 MG PO TB12
1.0000 | ORAL_TABLET | Freq: Two times a day (BID) | ORAL | Status: DC
  Administered 2024-11-19 – 2024-11-22 (×7): 1 via ORAL
  Filled 2024-11-19: qty 2
  Filled 2024-11-19 (×6): qty 1

## 2024-11-19 NOTE — Progress Notes (Signed)
 Mobility Specialist Progress Note:    11/19/24 1627  Mobility  Activity Ambulated with assistance  Level of Assistance Contact guard assist, steadying assist  Assistive Device Other (Comment) (1 HHA)  Distance Ambulated (ft) 18 ft  Range of Motion/Exercises Active;All extremities  Activity Response Tolerated well  Mobility visit 1 Mobility  Mobility Specialist Start Time (ACUTE ONLY) 1603  Mobility Specialist Stop Time (ACUTE ONLY) 1616  Mobility Specialist Time Calculation (min) (ACUTE ONLY) 13 min   Pt required CGA with one person hand-held assist to stand and ambulate. Tolerated well, asx throughout. Returned pt fowlers, alarm on and belongings in reach. All needs met.  Sherrilee Ditty Mobility Specialist Please contact via Special Educational Needs Teacher or  Rehab office at [phone]

## 2024-11-19 NOTE — Progress Notes (Signed)
" °  Progress Note   Patient: Ashley Gross FMW:[id number] DOB: 1951-08-15 DOA: 11/12/2024     7 DOS: the patient was seen and examined on 11/19/2024   Brief hospital course: 74 y.o. female with medical history significant of chronic pain diarrhea, chronic pancreatic insufficiency, IDDM, HTN, HLD, COPD, presented with worsening of cough shortness of breath and left-sided chest pain.    Assessment and Plan:   Influenza A with concern for bacterial superinfection - Multifocal pneumonia noted on imaging.  Coarse cough with thick sputum production.  Rapid swab positive for influenza A.  Procalcitonin 100.  Profound leukocytosis. Continue Tamiflu  Continue current antibiotics Long acting antitussive  added   Pleuritic chest pain-improving Continue as needed pain medication  Acute kidney injury Patient received IV fluid Monitor renal function   Normocytic anemia No indication for blood transfusion at this time     Insulin -dependent diabetes mellitus - Insulin  sliding scale on board.   Acute on chronic diarrhea - Appears to be resolved at this time.  History of pancreatic insufficiency, follows with GI in the outpatient setting.   COPD - No wheezing appreciated.  Nebulizers on board.     Subjective:  Patient on 2 L of intranasal oxygen Denies nausea vomiting abdominal pain Did have some cough   Physical Exam:    GENERAL:  Alert, pleasant, malaise HEENT:  EOMI CARDIOVASCULAR:  RRR, no murmurs appreciated RESPIRATORY: Coarse cough with sputum production, rhonchi bilaterally GASTROINTESTINAL:  Soft, nontender, nondistended EXTREMITIES:  No LE edema bilaterally NEURO:  No new focal deficits appreciated SKIN:  No rashes noted PSYCH:  Appropriate mood and affect      Data Reviewed:     Latest Ref Rng & Units 11/17/2024    5:18 AM 11/16/2024    5:36 AM 11/15/2024    4:37 AM  CBC  WBC 4.0 - 10.5 K/uL 8.1  8.7  10.4   Hemoglobin 12.0 - 15.0 g/dL 89.4  89.6  89.5   Hematocrit  36.0 - 46.0 % 31.5  31.3  30.6   Platelets 150 - 400 K/uL 253  241  237        Latest Ref Rng & Units 11/17/2024    5:18 AM 11/16/2024    5:36 AM 11/15/2024    4:37 AM  BMP  Glucose 70 - 99 mg/dL 890  58  51   BUN 8 - 23 mg/dL 14  13  17    Creatinine 0.44 - 1.00 mg/dL 9.26  9.27  9.14   Sodium 135 - 145 mmol/L 134  134  134   Potassium 3.5 - 5.1 mmol/L 3.9  3.6  3.4   Chloride 98 - 111 mmol/L 103  103  103   CO2 22 - 32 mmol/L 22  22  20    Calcium  8.9 - 10.3 mg/dL 7.7  8.0  7.7     Vitals:   11/18/24 2133 11/19/24 0432 11/19/24 0821 11/19/24 1449  BP: (!) 170/71 (!) 161/67 (!) 169/73 (!) 154/68  Pulse: 96 97 96 82  Resp: 20  18 16   Temp: 98.1 F (36.7 C) 98.9 F (37.2 C) 98 F (36.7 C) 98.2 F (36.8 C)  TempSrc: Oral Oral    SpO2: 98% 96% 98% 96%  Weight:      Height:         Author: Drue ONEIDA Potter, MD 11/19/2024 3:35 PM  For on call review [email or web address]  "

## 2024-11-19 NOTE — Plan of Care (Signed)
  Problem: Skin Integrity: Goal: Risk for impaired skin integrity will decrease Outcome: Progressing   Problem: Activity: Goal: Ability to tolerate increased activity will improve Outcome: Progressing   Problem: Respiratory: Goal: Ability to maintain adequate ventilation will improve Outcome: Progressing

## 2024-11-20 DIAGNOSIS — J1000 Influenza due to other identified influenza virus with unspecified type of pneumonia: Secondary | ICD-10-CM | POA: Diagnosis not present

## 2024-11-20 LAB — GLUCOSE, CAPILLARY
Glucose-Capillary: 106 mg/dL — ABNORMAL HIGH (ref 70–99)
Glucose-Capillary: 149 mg/dL — ABNORMAL HIGH (ref 70–99)
Glucose-Capillary: 143 mg/dL — ABNORMAL HIGH (ref 70–99)
Glucose-Capillary: 173 mg/dL — ABNORMAL HIGH (ref 70–99)

## 2024-11-20 LAB — CBC WITH DIFFERENTIAL/PLATELET
Abs Immature Granulocytes: 0.22 K/uL — ABNORMAL HIGH (ref 0.00–0.07)
Basophils Absolute: 0 K/uL (ref 0.0–0.1)
Basophils Relative: 0 %
Eosinophils Absolute: 0 K/uL (ref 0.0–0.5)
Eosinophils Relative: 0 %
HCT: 28.4 % — ABNORMAL LOW (ref 36.0–46.0)
Hemoglobin: 9.1 g/dL — ABNORMAL LOW (ref 12.0–15.0)
Immature Granulocytes: 2 %
Lymphocytes Relative: 12 %
Lymphs Abs: 1.7 K/uL (ref 0.7–4.0)
MCH: 28.7 pg (ref 26.0–34.0)
MCHC: 32 g/dL (ref 30.0–36.0)
MCV: 89.6 fL (ref 80.0–100.0)
Monocytes Absolute: 0.8 K/uL (ref 0.1–1.0)
Monocytes Relative: 6 %
Neutro Abs: 11 K/uL — ABNORMAL HIGH (ref 1.7–7.7)
Neutrophils Relative %: 80 %
Platelets: 294 K/uL (ref 150–400)
RBC: 3.17 MIL/uL — ABNORMAL LOW (ref 3.87–5.11)
RDW: 16.3 % — ABNORMAL HIGH (ref 11.5–15.5)
WBC: 13.7 K/uL — ABNORMAL HIGH (ref 4.0–10.5)
nRBC: 0 % (ref 0.0–0.2)

## 2024-11-20 LAB — BASIC METABOLIC PANEL WITH GFR
Anion gap: 11 (ref 5–15)
BUN: 18 mg/dL (ref 8–23)
CO2: 22 mmol/L (ref 22–32)
Calcium: 7.9 mg/dL — ABNORMAL LOW (ref 8.9–10.3)
Chloride: 105 mmol/L (ref 98–111)
Creatinine, Ser: 0.71 mg/dL (ref 0.44–1.00)
GFR, Estimated: >60 mL/min
Glucose, Bld: 95 mg/dL (ref 70–99)
Potassium: 3.8 mmol/L (ref 3.5–5.1)
Sodium: 138 mmol/L (ref 135–145)

## 2024-11-20 NOTE — Progress Notes (Signed)
 SPIRITUAL CARE AND COUNSELING CONSULT NOTE   VISIT SUMMARY Chaplain provided spiritual/emotional support to Ashley Gross while rounding on unit. Chaplain consulted with nurse team.   Ashley Gross                                                                                                                                                                      Type of Visit: Initial Care provided to:: Patient Referral source: Chaplain assessment Reason for visit: Routine spiritual support OnCall Visit: No   SPIRITUAL FRAMEWORK  Presenting Themes: Impactful experiences and emotions, Values and beliefs, Community and relationships Community/Connection: Family, Friend(s), Faith community, Significant other Patient Stress Factors: Loss, Family relationships Family Stress Factors: None identified   GOALS   Self/Personal Goals: healing, continued connection to faith community Clinical Care Goals: healing, continued connection to faith community   INTERVENTIONS   Spiritual Care Interventions Made: Compassionate presence, Meaning making, Prayer, Supported grief process    INTERVENTION OUTCOMES   Outcomes: Awareness around self/spiritual resourses, Connection to values and goals of care, Reduced isolation  Chaplain provided compassionate presence, meaning oriented conversation, and open ended question to elicit Ashley Gross's feelings about current health status, treatment plan, [employer] faith tradition and community, family support, and desired outcomes. Chaplain provided prayer upon request.   SPIRITUAL CARE PLAN   Spiritual Care Issues Still Outstanding: Chaplain will continue to follow    If immediate needs arise, please contact [facility] 24 hour on call [phone]   Barabara Chess, Chaplain  11/20/2024 12:26 PM

## 2024-11-20 NOTE — TOC Progression Note (Signed)
 Transition of Care [facility]) - Progression Note    Patient Details  Name: Ashley Gross MRN: [id number] Date of Birth: 1951/02/05  Transition of [facility]) CM/SW Contact  Nathanael CHRISTELLA Ring, RN Phone Number: 11/20/2024, 4:03 PM  Clinical Narrative:    Insurance authorization started, spoke with Tammy at [facility].  Asked for approval for SNF and EMS with [facility].  [name] says they cannot take her till she is off droplet which 10 days will be 1/21.    Expected Discharge Plan: Skilled Nursing Facility Barriers to Discharge: Continued Medical Work up               Expected Discharge Plan and Services   Discharge Planning Services: CM Consult Post Acute Care Choice: Skilled Nursing Facility Living arrangements for the past 2 months: Apartment                 DME Arranged: N/A                     Social Drivers of Health (SDOH) Interventions SDOH Screenings   Food Insecurity: No Food Insecurity (11/15/2024)  Housing: High Risk (11/15/2024)  Transportation Needs: No Transportation Needs (11/15/2024)  Utilities: Not At Risk (11/15/2024)  Alcohol Screen: Low Risk (04/24/2024)  Depression (PHQ2-9): High Risk (10/20/2024)  Financial Resource Strain: Low Risk  (09/12/2024)   Received from [facility]  Physical Activity: Insufficiently Active (04/24/2024)  Social Connections: Moderately Integrated (11/15/2024)  Stress: Stress Concern Present (04/24/2024)  Tobacco Use: Low Risk (11/12/2024)  Health Literacy: Adequate Health Literacy (04/28/2024)    Readmission Risk Interventions    11/17/2024    2:36 PM  Readmission Risk Prevention Plan  Transportation Screening Complete  HRI or Home Care Consult --  Palliative Care Screening Not Applicable  Medication Review (RN Care Manager) Complete

## 2024-11-20 NOTE — TOC Progression Note (Signed)
 Transition of Care [facility]) - Progression Note    Patient Details  Name: Ashley Gross MRN: [id number] Date of Birth: 12/29/1950  Transition of Care [facility]) CM/SW Contact  Nathanael CHRISTELLA Ring, RN Phone Number: 11/20/2024, 3:27 PM  Clinical Narrative:    Called and left a message with [employer], need to start auth for [facility] and Rehab.    Expected Discharge Plan: Skilled Nursing Facility Barriers to Discharge: Continued Medical Work up               Expected Discharge Plan and Services   Discharge Planning Services: CM Consult Post Acute Care Choice: Skilled Nursing Facility Living arrangements for the past 2 months: Apartment                 DME Arranged: N/A                     Social Drivers of Health (SDOH) Interventions SDOH Screenings   Food Insecurity: No Food Insecurity (11/15/2024)  Housing: High Risk (11/15/2024)  Transportation Needs: No Transportation Needs (11/15/2024)  Utilities: Not At Risk (11/15/2024)  Alcohol Screen: Low Risk (04/24/2024)  Depression (PHQ2-9): High Risk (10/20/2024)  Financial Resource Strain: Low Risk  (09/12/2024)   Received from [facility]  Physical Activity: Insufficiently Active (04/24/2024)  Social Connections: Moderately Integrated (11/15/2024)  Stress: Stress Concern Present (04/24/2024)  Tobacco Use: Low Risk (11/12/2024)  Health Literacy: Adequate Health Literacy (04/28/2024)    Readmission Risk Interventions    11/17/2024    2:36 PM  Readmission Risk Prevention Plan  Transportation Screening Complete  HRI or Home Care Consult --  Palliative Care Screening Not Applicable  Medication Review (RN Care Manager) Complete

## 2024-11-20 NOTE — TOC Initial Note (Signed)
 Transition of Care [facility]) - Initial/Assessment Note    Patient Details  Name: Ashley Gross MRN: [id number] Date of Birth: 1951-04-05  Transition of Care [facility]) CM/SW Contact:    Nathanael CHRISTELLA Ring, RN Phone Number: 11/20/2024, 12:49 PM  Clinical Narrative:                 CM met with patient at the bedside, she appears weak and tired very weak cough and says that she needs something for her cough- CM will let the nurse know.  Talked with her about SNF for STR, she says that she needs to talk to her daughter, she gives me permission to speak with her daughter.   Called and spoke with daughter, Warren, she is also at home sick with the flu.  She agrees to [employer] and chooses [facility] over the other 2 choices.  CM will go back this afternoon to speak with the patient again.   Expected Discharge Plan: Skilled Nursing Facility Barriers to Discharge: Continued Medical Work up   Patient Goals and CMS Choice Patient states their goals for this hospitalization and ongoing recovery are:: Agrees to STR CMS Medicare.gov Compare Post Acute Care list provided to:: Patient Choice offered to / list presented to : Patient, Adult Children      Expected Discharge Plan and Services   Discharge Planning Services: CM Consult Post Acute Care Choice: Skilled Nursing Facility Living arrangements for the past 2 months: Apartment                 DME Arranged: N/A                    Prior Living Arrangements/Services Living arrangements for the past 2 months: Apartment Lives with:: Self Patient language and need for interpreter reviewed:: Yes Do you feel safe going back to the place where you live?: Yes      Need for Family Participation in Patient Care: Yes (Comment) Care giver support system in place?: Yes (comment)   Criminal Activity/Legal Involvement Pertinent to Current Situation/Hospitalization: No - Comment as needed  Activities of Daily Living   ADL Screening (condition at time of  admission) Independently performs ADLs?: Yes (appropriate for developmental age)  Permission Sought/Granted Permission sought to share information with : Facility Medical Sales Representative, Family Supports Permission granted to share information with : Yes, Verbal Permission Granted  Share Information with NAME: Warren Level  Permission granted to share info w AGENCY: SNFs  Permission granted to share info w Relationship: daughter  Permission granted to share info w Contact Information: [phone]  Emotional Assessment Appearance:: Appears stated age Attitude/Demeanor/Rapport: Engaged Affect (typically observed): Accepting Orientation: : Oriented to Self, Oriented to Place, Oriented to  Time, Oriented to Situation Alcohol / Substance Use: Not Applicable Psych Involvement: No (comment)  Admission diagnosis:  Flu [J11.1] 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] Sepsis (HCC) [A41.9] Pneumonia of left lower lobe due to infectious organism [J18.9] Type 2 diabetes mellitus with diabetic nephropathy, without long-term current use of insulin  (HCC) [E11.21] Patient Active Problem List   Diagnosis Date Noted   Pneumonia due to influenza A virus 11/13/2024   Bacterial pneumonia 11/13/2024   AKI (acute kidney injury) 11/13/2024   Pleuritic chest pain 11/13/2024   Pneumonia of left lower lobe due to infectious organism 11/13/2024   CAP (community acquired pneumonia) 11/12/2024   Sepsis (HCC) 11/12/2024   Moderately severe major depression (HCC) 10/29/2024   Diarrhea 02/06/2024   Hypokalemia 12/18/2023   Fever 12/03/2023  Sore throat 12/03/2023   Murmur 09/12/2023   Back pain 09/12/2023   Dizziness 07/19/2023   Allergic rhinitis 07/19/2023   Iron deficiency anemia 06/07/2023   Severe obstructive sleep apnea 05/10/2023   Insomnia 05/10/2023   Daytime somnolence 03/30/2023   Perianal excoriation 02/26/2023   Asthma exacerbation 02/20/2023   Cough 02/15/2023   Urinary frequency  12/28/2022   Aneurysm 11/28/2022   Type 2 diabetes mellitus with diabetic nephropathy, without long-term current use of insulin  (HCC) 11/28/2022   History of CVA (cerebrovascular accident) 05/30/2022   Anemia 01/25/2022   Left inguinal hernia    Meningioma (HCC) 11/05/2021   Inguinal hernia 10/31/2021   Pituitary lesion 10/31/2021   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   Ear fullness 05/25/2021   Skin lesion 03/15/2021   Sleep difficulties 06/08/2020   Swelling of lower extremity 11/26/2019   Healthcare maintenance 09/13/2019   Sinus congestion 02/11/2019   Asthma 12/28/2018   Vitamin D  deficiency 11/01/2018   Tendinitis of upper biceps tendon of left shoulder 09/20/2018   Depression, major, single episode, mild 09/15/2018   Mild intermittent asthma without complication 09/15/2018   Type 2 diabetes mellitus with hyperglycemia (HCC) 09/15/2018   Depression 09/15/2018   Insulin  dependent type 2 diabetes mellitus (HCC) 09/15/2018   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   Traumatic incomplete tear of left rotator cuff 09/09/2018   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 [place] [address] Phone: [phone] Fax: [phone]  [id number] - [place], [place] - [address] [place] [address] Phone: [phone] Fax: [phone]     Social Drivers of Health (SDOH) Social History: SDOH Screenings   Food Insecurity: No Food Insecurity  (11/15/2024)  Housing: High Risk (11/15/2024)  Transportation Needs: No Transportation Needs (11/15/2024)  Utilities: Not At Risk (11/15/2024)  Alcohol Screen: Low Risk (04/24/2024)  Depression (PHQ2-9): High Risk (10/20/2024)  Financial Resource Strain: Low Risk  (09/12/2024)   Received from [facility]  Physical Activity: Insufficiently Active (04/24/2024)  Social Connections: Moderately Integrated (11/15/2024)  Stress: Stress Concern Present (04/24/2024)  Tobacco Use: Low Risk (11/12/2024)  Health Literacy: Adequate Health Literacy (04/28/2024)   SDOH Interventions:     Readmission Risk Interventions    11/17/2024    2:36 PM  Readmission Risk Prevention Plan  Transportation Screening Complete  HRI or Home Care Consult --  Palliative Care Screening Not Applicable  Medication Review (RN Care Manager) Complete

## 2024-11-20 NOTE — Progress Notes (Signed)
" °  Progress Note   Patient: Ashley Gross FMW:[id number] DOB: 1951/09/28 DOA: 11/12/2024     8 DOS: the patient was seen and examined on 11/20/2024     Brief hospital course: 74 y.o. female with medical history significant of chronic pain diarrhea, chronic pancreatic insufficiency, IDDM, HTN, HLD, COPD, presented with worsening of cough shortness of breath and left-sided chest pain.    Assessment and Plan:   Influenza A with concern for bacterial superinfection - Multifocal pneumonia noted on imaging.  Coarse cough with thick sputum production.  Rapid swab positive for influenza A.  Procalcitonin 100.  Profound leukocytosis. Was completed Tamiflu  Continue current antibiotics Long acting antitussive  added   Pleuritic chest pain-improving Continue as needed pain medication   Acute kidney injury Patient received IV fluid Monitor renal function   Normocytic anemia No indication for blood transfusion at this time     Insulin -dependent diabetes mellitus - Insulin  sliding scale on board.   Acute on chronic diarrhea - Appears to be resolved at this time.  History of pancreatic insufficiency, follows with GI in the outpatient setting.   COPD - No wheezing appreciated.  Nebulizers on board.     Subjective:  Patient requiring 2 L of oxygen Respiratory function slowly improving Still has some underlying cough   Physical Exam:    GENERAL:  Alert, pleasant, malaise HEENT:  EOMI CARDIOVASCULAR:  RRR, no murmurs appreciated RESPIRATORY: Coarse cough with sputum production, rhonchi bilaterally GASTROINTESTINAL:  Soft, nontender, nondistended EXTREMITIES:  No LE edema bilaterally NEURO:  No new focal deficits appreciated SKIN:  No rashes noted PSYCH:  Appropriate mood and affect      Data Reviewed:  Vitals:   11/19/24 1449 11/19/24 2026 11/20/24 0901 11/20/24 1503  BP: (!) 154/68 (!) 153/67 (!) 163/69 (!) 167/73  Pulse: 82 87 99 97  Resp: 16 17 16 16   Temp: 98.2 F (36.8  C) 99 F (37.2 C) 98.2 F (36.8 C) 98.2 F (36.8 C)  TempSrc:   Oral Oral  SpO2: 96% 95% 97% 98%  Weight:      Height:          Latest Ref Rng & Units 11/20/2024    5:30 AM 11/17/2024    5:18 AM 11/16/2024    5:36 AM  BMP  Glucose 70 - 99 mg/dL 95  890  58   BUN 8 - 23 mg/dL 18  14  13    Creatinine 0.44 - 1.00 mg/dL 9.28  9.26  9.27   Sodium 135 - 145 mmol/L 138  134  134   Potassium 3.5 - 5.1 mmol/L 3.8  3.9  3.6   Chloride 98 - 111 mmol/L 105  103  103   CO2 22 - 32 mmol/L 22  22  22    Calcium  8.9 - 10.3 mg/dL 7.9  7.7  8.0        Latest Ref Rng & Units 11/20/2024    5:30 AM 11/17/2024    5:18 AM 11/16/2024    5:36 AM  CBC  WBC 4.0 - 10.5 K/uL 13.7  8.1  8.7   Hemoglobin 12.0 - 15.0 g/dL 9.1  89.4  89.6   Hematocrit 36.0 - 46.0 % 28.4  31.5  31.3   Platelets 150 - 400 K/uL 294  253  241       Author: Drue ONEIDA Potter, MD 11/20/2024 5:07 PM  For on call review [email or web address].  "

## 2024-11-20 NOTE — Progress Notes (Signed)
 Physical Therapy Treatment Patient Details Name: Ashley Gross MRN: [id number] DOB: [birth year] Today's Date: 11/20/2024   History of Present Illness Pt is a 74 y.o. female presented with worsening of cough shortness of breath and left-sided chest pain. Admitted with flu a, multifocal PNA and AKI. PMH of chronic pain diarrhea, chronic pancreatic insufficiency, IDDM, HTN, HLD, COPD, CVA    PT Comments  Pt was pleasant and motivated to participate during the session and put forth good effort throughout. Pt required noted extra time and effort to perform bed mobility tasks and transfers along with cueing for proper hand placement during transfers but required no physical assist.  Pt was able to perform two bouts of amb per below with seated therapeutic rest break between bouts with slow cadence and short B step length but was steady with no overt LOB.  Pt reported no adverse symptoms during the session with SpO2 WNL and HR in the upper 120's with exertion on supplemental O2.  Pt will benefit from continued PT services upon discharge to safely address deficits listed in patient problem list for decreased caregiver assistance and eventual return to PLOF.     If plan is discharge home, recommend the following: A little help with walking and/or transfers;A little help with bathing/dressing/bathroom;Help with stairs or ramp for entrance;Assist for transportation;Assistance with cooking/housework   Can travel by private vehicle     Yes  Equipment Recommendations  None recommended by PT    Recommendations for Other Services       Precautions / Restrictions Precautions Precautions: Fall Recall of Precautions/Restrictions: Intact Restrictions Weight Bearing Restrictions Per Provider Order: No     Mobility  Bed Mobility Overal bed mobility: Needs Assistance Bed Mobility: Supine to Sit, Sit to Supine     Supine to sit: Supervision, Used rails Sit to supine: Supervision, Used rails    General bed mobility comments: Increased time and effort and use of the bed rail only    Transfers Overall transfer level: Needs assistance Equipment used: Rolling walker (2 wheels) Transfers: Sit to/from Stand Sit to Stand: Supervision           General transfer comment: Min verbal cues for hand placement with notable effort required to come to standing but no physical assist needed    Ambulation/Gait Ambulation/Gait assistance: Contact guard assist, Supervision Gait Distance (Feet): 15 Feet x 2 Assistive device: Rolling walker (2 wheels) Gait Pattern/deviations: Decreased step length - right, Decreased step length - left, Step-through pattern, Trunk flexed Gait velocity: decreased     General Gait Details: Mild trunk flexion with very slow, effortful cadence and min to mod lean on the RW for support but no overt LOB noted   Stairs             Wheelchair Mobility     Tilt Bed    Modified Rankin (Stroke Patients Only)       Balance Overall balance assessment: Needs assistance Sitting-balance support: Feet supported Sitting balance-Leahy Scale: Good     Standing balance support: During functional activity, Bilateral upper extremity supported, Reliant on assistive device for balance Standing balance-Leahy Scale: Good                              Communication Communication Communication: No apparent difficulties  Cognition Arousal: Alert Behavior During Therapy: WFL for tasks assessed/performed   PT - Cognitive impairments: No apparent impairments  Following commands: Intact      Cueing Cueing Techniques: Verbal cues  Exercises Total Joint Exercises Ankle Circles/Pumps: AROM, Strengthening, Both, 10 reps, 5 reps (with manual resistance) Quad Sets: Strengthening, Both, 10 reps Hip ABduction/ADduction: Strengthening, Both, 10 reps Straight Leg Raises: Strengthening, Both, 10 reps Long Arc Quad:  Strengthening, AROM, Both, 10 reps Knee Flexion: AROM, Strengthening, Both, 10 reps Other Exercises Other Exercises: HEP education and review for BLE APs, QS, and GS x 10 every 1-2 hours, BLE LAQs x 10 whenever in position to perform Other Exercises: Pt education on proper breathing sequencing during therex Other Exercises: pt education on physiological benefits of activity and time upright in chair vs in bed    General Comments        Pertinent Vitals/Pain Pain Assessment Pain Assessment: No/denies pain Pain Location: Pain with coughing only    Home Living                          Prior Function            PT Goals (current goals can now be found in the care plan section) Progress towards PT goals: Progressing toward goals    Frequency    Min 2X/week      PT Plan      Co-evaluation              AM-PAC PT 6 Clicks Mobility   Outcome Measure  Help needed turning from your back to your side while in a flat bed without using bedrails?: A Little Help needed moving from lying on your back to sitting on the side of a flat bed without using bedrails?: A Little Help needed moving to and from a bed to a chair (including a wheelchair)?: A Little Help needed standing up from a chair using your arms (e.g., wheelchair or bedside chair)?: A Little Help needed to walk in hospital room?: A Little Help needed climbing 3-5 steps with a railing? : A Lot 6 Click Score: 17    End of Session Equipment Utilized During Treatment: Oxygen;Gait belt Activity Tolerance: Patient tolerated treatment well Patient left: in bed;with call bell/phone within reach;with bed alarm set;Other (comment) (Pt declined chair) Nurse Communication: Mobility status PT Visit Diagnosis: Other abnormalities of gait and mobility (R26.89);Muscle weakness (generalized) (M62.81);Difficulty in walking, not elsewhere classified (R26.2);Unsteadiness on feet (R26.81)     Time: 8468-8395 PT Time  Calculation (min) (ACUTE ONLY): 33 min  Charges:    $Gait Training: 8-22 mins $Therapeutic Exercise: 8-22 mins PT General Charges $$ ACUTE PT VISIT: 1 Visit                    D. Scott Mikhayla Phillis PT, DPT 11/20/24, 4:20 PM

## 2024-11-20 NOTE — Plan of Care (Signed)
  Problem: Safety: Goal: Ability to remain free from injury will improve Outcome: Progressing   Problem: Skin Integrity: Goal: Risk for impaired skin integrity will decrease Outcome: Progressing   Problem: Respiratory: Goal: Ability to maintain adequate ventilation will improve Outcome: Progressing

## 2024-11-21 ENCOUNTER — Ambulatory Visit: Admitting: Internal Medicine

## 2024-11-21 DIAGNOSIS — J1000 Influenza due to other identified influenza virus with unspecified type of pneumonia: Secondary | ICD-10-CM | POA: Diagnosis not present

## 2024-11-21 LAB — CBC WITH DIFFERENTIAL/PLATELET
Abs Immature Granulocytes: 0.22 K/uL — ABNORMAL HIGH (ref 0.00–0.07)
Basophils Absolute: 0 K/uL (ref 0.0–0.1)
Basophils Relative: 0 %
Eosinophils Absolute: 0 K/uL (ref 0.0–0.5)
Eosinophils Relative: 0 %
HCT: 27.4 % — ABNORMAL LOW (ref 36.0–46.0)
Hemoglobin: 8.9 g/dL — ABNORMAL LOW (ref 12.0–15.0)
Immature Granulocytes: 2 %
Lymphocytes Relative: 14 %
Lymphs Abs: 1.7 K/uL (ref 0.7–4.0)
MCH: 28.4 pg (ref 26.0–34.0)
MCHC: 32.5 g/dL (ref 30.0–36.0)
MCV: 87.5 fL (ref 80.0–100.0)
Monocytes Absolute: 0.7 K/uL (ref 0.1–1.0)
Monocytes Relative: 6 %
Neutro Abs: 9.3 K/uL — ABNORMAL HIGH (ref 1.7–7.7)
Neutrophils Relative %: 78 %
Platelets: 301 K/uL (ref 150–400)
RBC: 3.13 MIL/uL — ABNORMAL LOW (ref 3.87–5.11)
RDW: 16.5 % — ABNORMAL HIGH (ref 11.5–15.5)
WBC: 11.9 K/uL — ABNORMAL HIGH (ref 4.0–10.5)
nRBC: 0 % (ref 0.0–0.2)

## 2024-11-21 LAB — GLUCOSE, CAPILLARY
Glucose-Capillary: 110 mg/dL — ABNORMAL HIGH (ref 70–99)
Glucose-Capillary: 142 mg/dL — ABNORMAL HIGH (ref 70–99)
Glucose-Capillary: 117 mg/dL — ABNORMAL HIGH (ref 70–99)
Glucose-Capillary: 116 mg/dL — ABNORMAL HIGH (ref 70–99)

## 2024-11-21 LAB — BASIC METABOLIC PANEL WITH GFR
Anion gap: 9 (ref 5–15)
BUN: 15 mg/dL (ref 8–23)
CO2: 23 mmol/L (ref 22–32)
Calcium: 7.8 mg/dL — ABNORMAL LOW (ref 8.9–10.3)
Chloride: 104 mmol/L (ref 98–111)
Creatinine, Ser: 0.68 mg/dL (ref 0.44–1.00)
GFR, Estimated: >60 mL/min
Glucose, Bld: 110 mg/dL — ABNORMAL HIGH (ref 70–99)
Potassium: 3.9 mmol/L (ref 3.5–5.1)
Sodium: 136 mmol/L (ref 135–145)

## 2024-11-21 MED ORDER — GUAIFENESIN-CODEINE 100-10 MG/5ML PO SOLN
5.0000 mL | Freq: Once | ORAL | Status: AC
  Administered 2024-11-21: 5 mL via ORAL
  Filled 2024-11-21: qty 5

## 2024-11-21 NOTE — Progress Notes (Signed)
" °  Progress Note   Patient: Ashley Gross FMW:[id number] DOB: June 08, 1951 DOA: 11/12/2024     9 DOS: the patient was seen and examined on 11/21/2024     Brief hospital course: 74 y.o. female with medical history significant of chronic pain diarrhea, chronic pancreatic insufficiency, IDDM, HTN, HLD, COPD, presented with worsening of cough shortness of breath and left-sided chest pain.    Assessment and Plan:   Influenza A with concern for bacterial superinfection - Multifocal pneumonia noted on imaging.  Coarse cough with thick sputum production.  Rapid swab positive for influenza A.  Procalcitonin 100.  Profound leukocytosis on presentation Was completed Tamiflu  Completed a course of empiric antibiotics Continue antitussives   Pleuritic chest pain-improving Continue as needed pain medication   Acute kidney injury-resolved Patient received IV fluid Monitor renal function   Normocytic anemia No indication for blood transfusion at this time     Insulin -dependent diabetes mellitus - Insulin  sliding scale on board.   Acute on chronic diarrhea - Appears to be resolved at this time.  History of pancreatic insufficiency, follows with GI in the outpatient setting.   COPD Not in acute exacerbation Continue as needed nebulization     Subjective:  Patient requiring 2 L of oxygen Respiratory function improved Still has some underlying cough TOC working on placement   Physical Exam:    GENERAL:  Alert, pleasant, malaise HEENT:  EOMI CARDIOVASCULAR:  RRR, no murmurs appreciated RESPIRATORY: Coarse cough with sputum production, rhonchi bilaterally GASTROINTESTINAL:  Soft, nontender, nondistended EXTREMITIES:  No LE edema bilaterally NEURO:  No new focal deficits appreciated SKIN:  No rashes noted PSYCH:  Appropriate mood and affect      Data Reviewed:    Latest Ref Rng & Units 11/21/2024    4:18 AM 11/20/2024    5:30 AM 11/17/2024    5:18 AM  CBC  WBC 4.0 - 10.5 K/uL 11.9   13.7  8.1   Hemoglobin 12.0 - 15.0 g/dL 8.9  9.1  89.4   Hematocrit 36.0 - 46.0 % 27.4  28.4  31.5   Platelets 150 - 400 K/uL 301  294  253        Latest Ref Rng & Units 11/21/2024    4:18 AM 11/20/2024    5:30 AM 11/17/2024    5:18 AM  BMP  Glucose 70 - 99 mg/dL 889  95  890   BUN 8 - 23 mg/dL 15  18  14    Creatinine 0.44 - 1.00 mg/dL 9.31  9.28  9.26   Sodium 135 - 145 mmol/L 136  138  134   Potassium 3.5 - 5.1 mmol/L 3.9  3.8  3.9   Chloride 98 - 111 mmol/L 104  105  103   CO2 22 - 32 mmol/L 23  22  22    Calcium  8.9 - 10.3 mg/dL 7.8  7.9  7.7      Vitals:   11/20/24 2115 11/21/24 0147 11/21/24 0407 11/21/24 0740  BP: (!) 158/73  (!) 159/73 (!) 163/74  Pulse: 99 (!) 106 87 92  Resp: 18  16 16   Temp: 98.4 F (36.9 C)  98.4 F (36.9 C) 98.4 F (36.9 C)  TempSrc: Oral  Oral Oral  SpO2: 97% 96% 97% 95%  Weight:      Height:       Disposition: Patient pending SNF placement  Author: Drue ONEIDA Potter, MD 11/21/2024 3:53 PM  For on call review [email or web address].  "

## 2024-11-21 NOTE — Telephone Encounter (Signed)
 Per note, med list - states - not taking. In hospital.

## 2024-11-21 NOTE — TOC Progression Note (Addendum)
 Transition of Care [facility]) - Progression Note    Patient Details  Name: Ashley Gross MRN: [id number] Date of Birth: Dec 15, 1950  [facility]) CM/SW Contact  Corean ONEIDA Haddock, RN Phone Number: 11/21/2024, 12:03 PM  Clinical Narrative:     Per Nathanael Ring  [employer] approved for 7 days- auth number [id number]- EMS auth under medical review   Notified that [facility] has approved EMS transport through [employer]  Auth # [id number]   Expected Discharge Plan: Skilled Nursing Facility Barriers to Discharge: Continued Medical Work up               Expected Discharge Plan and Services   Discharge Planning Services: CM Consult Post Acute Care Choice: Skilled Nursing Facility Living arrangements for the past 2 months: Apartment                 DME Arranged: N/A                     Social Drivers of Health (SDOH) Interventions SDOH Screenings   Food Insecurity: No Food Insecurity (11/15/2024)  Housing: High Risk (11/15/2024)  Transportation Needs: No Transportation Needs (11/15/2024)  Utilities: Not At Risk (11/15/2024)  Alcohol Screen: Low Risk (04/24/2024)  Depression (PHQ2-9): High Risk (10/20/2024)  Financial Resource Strain: Low Risk  (09/12/2024)   Received from [facility]  Physical Activity: Insufficiently Active (04/24/2024)  Social Connections: Moderately Integrated (11/15/2024)  Stress: Stress Concern Present (04/24/2024)  Tobacco Use: Low Risk (11/12/2024)  Health Literacy: Adequate Health Literacy (04/28/2024)    Readmission Risk Interventions    11/17/2024    2:36 PM  Readmission Risk Prevention Plan  Transportation Screening Complete  HRI or Home Care Consult --  Palliative Care Screening Not Applicable  Medication Review (RN Care Manager) Complete

## 2024-11-21 NOTE — Progress Notes (Signed)
 Mobility Specialist - Progress Note   11/21/24 1028  Mobility  Activity Pivoted/transferred from chair to bed  Level of Assistance Standby assist, set-up cues, supervision of patient - no hands on  Assistive Device Front wheel walker  Distance Ambulated (ft) 2 ft  Activity Response Tolerated well  Mobility visit 1 Mobility  Mobility Specialist Start Time (ACUTE ONLY) 1016  Mobility Specialist Stop Time (ACUTE ONLY) 1023  Mobility Specialist Time Calculation (min) (ACUTE ONLY) 7 min   Pt sitting in the recliner upon entry, utilizing RA. Pt required CGA to stand from the recliner, transferring to bed via SPT w/ sup--- tolerated well. Pt left semi fowler with alarm set and needs within reach.  America Silvan Mobility Specialist 11/21/24 10:31 AM

## 2024-11-21 NOTE — Progress Notes (Signed)
 Occupational Therapy Treatment Patient Details Name: Ashley Gross MRN: [id number] DOB: [birth year] Today's Date: 11/21/2024   History of present illness Pt is a 74 y.o. female presented with worsening of cough shortness of breath and left-sided chest pain. Admitted with flu a, multifocal PNA and AKI. PMH of chronic pain diarrhea, chronic pancreatic insufficiency, IDDM, HTN, HLD, COPD, CVA   OT comments  Pt is supine in bed on arrival. Fatigued and still continues to c/o weakness and pain from coughing, but agreeable to OT session with encouragement. Pt performed bed mobility with Min A via log roll technique and use of bed rails. Pt required CGA for LB dressing to don mesh underwear without UE support in standing, edu on use of unilateral support on RW to prevent LOB. ADL transfer performed ~15 ft to perform standing grooming tasks at sink with unilateral support to maintain stability. Pt fatigues easily with minimal activity and required increased encouragement to sit up in recliner to promote pulmonary toileting. Set up assist to eat her breakfast once seated in recliner. Sp02 stable on 2L and HR up to 123 with activity.  Pt left with all needs in place and will cont to require skilled acute OT services to maximize her safety and IND to return to PLOF. She remains far from her baseline function with DC recommendation remaining appropriate.       If plan is discharge home, recommend the following:  Assistance with cooking/housework;Help with stairs or ramp for entrance;A little help with bathing/dressing/bathroom;A little help with walking and/or transfers   Equipment Recommendations  Other (comment) (defer)    Recommendations for Other Services      Precautions / Restrictions Precautions Precautions: Fall Recall of Precautions/Restrictions: Intact Restrictions Weight Bearing Restrictions Per Provider Order: No       Mobility Bed Mobility Overal bed mobility: Needs Assistance Bed  Mobility: Rolling, Sidelying to Sit Rolling: Contact guard assist, Used rails Sidelying to sit: Min assist       General bed mobility comments: increased time, effort, use of bed rail and cues for log roll technique to minimze chest pain    Transfers Overall transfer level: Needs assistance Equipment used: Rolling walker (2 wheels) Transfers: Sit to/from Stand Sit to Stand: Supervision           General transfer comment: cues for hand placement to stand from EOB to RW, ambulates within the room ~15 ft using RW with CGA     Balance Overall balance assessment: Needs assistance Sitting-balance support: Feet supported Sitting balance-Leahy Scale: Good     Standing balance support: During functional activity, No upper extremity supported Standing balance-Leahy Scale: Fair Standing balance comment: standing to donn mesh underwear, edu on use of unilateral support to maintain stability                           ADL either performed or assessed with clinical judgement   ADL Overall ADL's : Needs assistance/impaired     Grooming: Standing;Contact guard assist;Wash/dry face               Lower Body Dressing: Contact guard assist;Sitting/lateral leans;Sit to/from stand Lower Body Dressing Details (indicate cue type and reason): donn mesh underwear             Functional mobility during ADLs: Rolling walker (2 wheels);Contact guard assist      Extremity/Trunk Assessment              Vision  Perception     Praxis     Communication Communication Communication: No apparent difficulties   Cognition Arousal: Alert Behavior During Therapy: WFL for tasks assessed/performed                                 Following commands: Intact        Cueing   Cueing Techniques: Verbal cues  Exercises      Shoulder Instructions       General Comments HR up to 123 after activity, sp02 stable 2L throughout session    Pertinent  Vitals/ Pain       Pain Assessment Pain Assessment: Faces Faces Pain Scale: Hurts a little bit Pain Location: Pain with coughing only Pain Descriptors / Indicators: Aching, Sore Pain Intervention(s): Monitored during session, Repositioned  Home Living                                          Prior Functioning/Environment              Frequency  Min 2X/week        Progress Toward Goals  OT Goals(current goals can now be found in the care plan section)  Progress towards OT goals: Progressing toward goals  Acute Rehab OT Goals Patient Stated Goal: feel better OT Goal Formulation: With patient Time For Goal Achievement: 11/28/24 Potential to Achieve Goals: Fair  Plan      Co-evaluation                 AM-PAC OT 6 Clicks Daily Activity     Outcome Measure   Help from another person eating meals?: A Little Help from another person taking care of personal grooming?: A Little Help from another person toileting, which includes using toliet, bedpan, or urinal?: A Little Help from another person bathing (including washing, rinsing, drying)?: A Lot Help from another person to put on and taking off regular upper body clothing?: A Little Help from another person to put on and taking off regular lower body clothing?: A Little 6 Click Score: 17    End of Session Equipment Utilized During Treatment: Oxygen;Rolling walker (2 wheels)  OT Visit Diagnosis: Other abnormalities of gait and mobility (R26.89);Muscle weakness (generalized) (M62.81)   Activity Tolerance Patient limited by fatigue;Patient tolerated treatment well   Patient Left with call bell/phone within reach;in chair;with chair alarm set   Nurse Communication Mobility status        Time: [phone] OT Time Calculation (min): 27 min  Charges: OT General Charges $OT Visit: 1 Visit OT Treatments $Self Care/Home Management : 23-37 mins  Ashley Gross, OTR/L  11/21/24, 11:16  AM   Emiliano Welshans E Gross 11/21/2024, 11:13 AM

## 2024-11-21 NOTE — Progress Notes (Signed)
 Physical Therapy Treatment Patient Details Name: Ashley Gross MRN: [id number] DOB: [birth year] Today's Date: 11/21/2024   History of Present Illness Pt is a 74 y.o. female presented with worsening of cough shortness of breath and left-sided chest pain. Admitted with flu a, multifocal PNA and AKI. PMH of chronic pain diarrhea, chronic pancreatic insufficiency, IDDM, HTN, HLD, COPD, CVA    PT Comments  Pt was pleasant and motivated to participate during the session and put forth good effort throughout. Pt required extra time, effort, and use of bed rails during bed mobility training with log rolling sequencing reviewed and with pt demonstrating good carryover.  Pt was able to perform multiple sit to/from stand transfers from various height surfaces with min cues for hand placement with pt needed notable time and effort to come to full upright standing but no physical assist.  Pt was able to perform multiple bouts of amb this session with seated therapeutic rest breaks between bouts and demonstrated grossly improved activity tolerance compared to prior sessions with SpO2 and HR WNL on 2LO2/min.  Pt will benefit from continued PT services upon discharge to safely address deficits listed in patient problem list for decreased caregiver assistance and eventual return to PLOF.      If plan is discharge home, recommend the following: A little help with walking and/or transfers;A little help with bathing/dressing/bathroom;Help with stairs or ramp for entrance;Assist for transportation;Assistance with cooking/housework   Can travel by private vehicle     Yes  Equipment Recommendations  None recommended by PT    Recommendations for Other Services       Precautions / Restrictions Precautions Precautions: Fall Recall of Precautions/Restrictions: Intact Restrictions Weight Bearing Restrictions Per Provider Order: No     Mobility  Bed Mobility Overal bed mobility: Needs Assistance   Rolling:  Supervision, Used rails Sidelying to sit: Supervision, Used rails     Sit to sidelying: Supervision, Used rails General bed mobility comments: increased time, effort, and use of bed rails; good carryover of proper log roll sequencing techniques    Transfers Overall transfer level: Needs assistance Equipment used: Rolling walker (2 wheels)   Sit to Stand: Supervision           General transfer comment: Min to mod verbal cues for hand placement with min extra time and effort to come to standing but no physical assist needed    Ambulation/Gait Ambulation/Gait assistance: Supervision Gait Distance (Feet): 15 Feet x 2, 45 Feet x 1  Assistive device: Rolling walker (2 wheels) Gait Pattern/deviations: Decreased step length - right, Decreased step length - left, Step-through pattern, Trunk flexed Gait velocity: decreased     General Gait Details: Mild trunk flexion with slow cadence but grossly improved from prior sessions with activity tolerance also slowly progressing   Stairs             Wheelchair Mobility     Tilt Bed    Modified Rankin (Stroke Patients Only)       Balance Overall balance assessment: Needs assistance Sitting-balance support: Feet supported Sitting balance-Leahy Scale: Good     Standing balance support: During functional activity, Bilateral upper extremity supported Standing balance-Leahy Scale: Good                              Communication Communication Communication: No apparent difficulties  Cognition Arousal: Alert Behavior During Therapy: WFL for tasks assessed/performed   PT - Cognitive impairments: No apparent  impairments                         Following commands: Intact      Cueing Cueing Techniques: Verbal cues  Exercises Other Exercises Other Exercises: Pt education/review on physiological benefits of activity and time upright in chair vs in bed    General Comments        Pertinent  Vitals/Pain Pain Assessment Pain Assessment: No/denies pain Pain Location: Pain with coughing only Pain Intervention(s): Monitored during session    Home Living                          Prior Function            PT Goals (current goals can now be found in the care plan section) Progress towards PT goals: Progressing toward goals    Frequency    Min 2X/week      PT Plan      Co-evaluation              AM-PAC PT 6 Clicks Mobility   Outcome Measure  Help needed turning from your back to your side while in a flat bed without using bedrails?: A Little Help needed moving from lying on your back to sitting on the side of a flat bed without using bedrails?: A Little Help needed moving to and from a bed to a chair (including a wheelchair)?: A Little Help needed standing up from a chair using your arms (e.g., wheelchair or bedside chair)?: A Little Help needed to walk in hospital room?: A Little Help needed climbing 3-5 steps with a railing? : A Lot 6 Click Score: 17    End of Session Equipment Utilized During Treatment: Oxygen;Gait belt Activity Tolerance: Patient tolerated treatment well Patient left: in bed;with call bell/phone within reach;with bed alarm set;Other (comment) (declined up in chair) Nurse Communication: Mobility status PT Visit Diagnosis: Other abnormalities of gait and mobility (R26.89);Muscle weakness (generalized) (M62.81);Difficulty in walking, not elsewhere classified (R26.2);Unsteadiness on feet (R26.81)     Time: [id number] PT Time Calculation (min) (ACUTE ONLY): 24 min  Charges:    $Gait Training: 8-22 mins $Therapeutic Activity: 8-22 mins PT General Charges $$ ACUTE PT VISIT: 1 Visit                     D. Scott Nathaly Dawkins PT, DPT 11/21/24, 5:03 PM

## 2024-11-22 DIAGNOSIS — J1000 Influenza due to other identified influenza virus with unspecified type of pneumonia: Secondary | ICD-10-CM | POA: Diagnosis not present

## 2024-11-22 LAB — CBC WITH DIFFERENTIAL/PLATELET
Abs Immature Granulocytes: 0.23 K/uL — ABNORMAL HIGH (ref 0.00–0.07)
Basophils Absolute: 0 K/uL (ref 0.0–0.1)
Basophils Relative: 0 %
Eosinophils Absolute: 0 K/uL (ref 0.0–0.5)
Eosinophils Relative: 0 %
HCT: 28.2 % — ABNORMAL LOW (ref 36.0–46.0)
Hemoglobin: 9.2 g/dL — ABNORMAL LOW (ref 12.0–15.0)
Immature Granulocytes: 2 %
Lymphocytes Relative: 13 %
Lymphs Abs: 1.5 K/uL (ref 0.7–4.0)
MCH: 28.5 pg (ref 26.0–34.0)
MCHC: 32.6 g/dL (ref 30.0–36.0)
MCV: 87.3 fL (ref 80.0–100.0)
Monocytes Absolute: 0.7 K/uL (ref 0.1–1.0)
Monocytes Relative: 6 %
Neutro Abs: 9 K/uL — ABNORMAL HIGH (ref 1.7–7.7)
Neutrophils Relative %: 79 %
Platelets: 380 K/uL (ref 150–400)
RBC: 3.23 MIL/uL — ABNORMAL LOW (ref 3.87–5.11)
RDW: 16.1 % — ABNORMAL HIGH (ref 11.5–15.5)
WBC: 11.5 K/uL — ABNORMAL HIGH (ref 4.0–10.5)
nRBC: 0 % (ref 0.0–0.2)

## 2024-11-22 LAB — BASIC METABOLIC PANEL WITH GFR
Anion gap: 8 (ref 5–15)
BUN: 12 mg/dL (ref 8–23)
CO2: 24 mmol/L (ref 22–32)
Calcium: 8.2 mg/dL — ABNORMAL LOW (ref 8.9–10.3)
Chloride: 105 mmol/L (ref 98–111)
Creatinine, Ser: 0.62 mg/dL (ref 0.44–1.00)
GFR, Estimated: >60 mL/min
Glucose, Bld: 104 mg/dL — ABNORMAL HIGH (ref 70–99)
Potassium: 3.7 mmol/L (ref 3.5–5.1)
Sodium: 137 mmol/L (ref 135–145)

## 2024-11-22 LAB — GLUCOSE, CAPILLARY: Glucose-Capillary: 106 mg/dL — ABNORMAL HIGH (ref 70–99)

## 2024-11-22 MED ORDER — HYDROCOD POLI-CHLORPHE POLI ER 10-8 MG/5ML PO SUER: 5.0000 mL | Freq: Two times a day (BID) | ORAL | 0 refills | Status: AC | PRN

## 2024-11-22 MED ORDER — TRESIBA FLEXTOUCH 100 UNIT/ML ~~LOC~~ SOPN: 8.0000 [IU] | PEN_INJECTOR | Freq: Every day | SUBCUTANEOUS | Status: AC

## 2024-11-22 MED ORDER — GUAIFENESIN-DM 100-10 MG/5ML PO SYRP: 5.0000 mL | ORAL_SOLUTION | ORAL | Status: AC | PRN

## 2024-11-22 NOTE — TOC Transition Note (Signed)
 Transition of Care [facility]) - Discharge Note   Patient Details  Name: Ashley Gross MRN: [id number] Date of Birth: [birth year]  [facility]) CM/SW Contact:  Corean ONEIDA Haddock, RN Phone Number: 11/22/2024, 10:19 AM   Clinical Narrative:      Patient will DC to: [name] Anticipated DC date: 11/22/24  Family notified: VM left for daughter Warren per patient request Transport by: [facility]  Per MD patient ready for DC to . RN, patient,  and facility notified of DC. Discharge Summary sent to facility. RN given number for report. DC packet on chart. Ambulance transport requested for patient.   TOC signing off.     Barriers to Discharge: Continued Medical Work up   Patient Goals and CMS Choice Patient states their goals for this hospitalization and ongoing recovery are:: Agrees to STR CMS Medicare.gov Compare Post Acute Care list provided to:: Patient Choice offered to / list presented to : Patient, Adult Children      Discharge Placement                       Discharge Plan and Services Additional resources added to the After Visit Summary for     Discharge Planning Services: CM Consult Post Acute Care Choice: Skilled Nursing Facility          DME Arranged: N/A                    Social Drivers of Health (SDOH) Interventions SDOH Screenings   Food Insecurity: No Food Insecurity (11/15/2024)  Housing: High Risk (11/15/2024)  Transportation Needs: No Transportation Needs (11/15/2024)  Utilities: Not At Risk (11/15/2024)  Alcohol Screen: Low Risk (04/24/2024)  Depression (PHQ2-9): High Risk (10/20/2024)  Financial Resource Strain: Low Risk  (09/12/2024)   Received from [facility]  Physical Activity: Insufficiently Active (04/24/2024)  Social Connections: Moderately Integrated (11/15/2024)  Stress: Stress Concern Present (04/24/2024)  Tobacco Use: Low Risk (11/12/2024)  Health Literacy: Adequate Health Literacy (04/28/2024)      Readmission Risk Interventions    11/17/2024    2:36 PM  Readmission Risk Prevention Plan  Transportation Screening Complete  HRI or Home Care Consult --  Palliative Care Screening Not Applicable  Medication Review (RN Care Manager) Complete

## 2024-11-22 NOTE — Discharge Summary (Signed)
 Physician Discharge Summary  Ashley Gross FMW:[id number] DOB: 03/01/1951 DOA: 11/12/2024  PCP: Glendia Shad, MD  Admit date: 11/12/2024 Discharge date: 11/22/2024  Admitted From: Home Disposition:  SNF  Recommendations for Outpatient Follow-up:  Follow up with PCP in 1-2 weeks   Home Health:No  Equipment/Devices:Oxygen 2L via [place]   Discharge Condition:Stable CODE STATUS:FULL  Diet recommendation: Heart/carb  Brief/Interim Summary: 74 y.o. female with medical history significant of chronic pain diarrhea, chronic pancreatic insufficiency, IDDM, HTN, HLD, COPD, presented with worsening of cough shortness of breath and left-sided chest pain.        Discharge Diagnoses:  Principal Problem:   Pneumonia due to influenza A virus Active Problems:   Insulin  dependent type 2 diabetes mellitus (HCC)   CAP (community acquired pneumonia)   Bacterial pneumonia   AKI (acute kidney injury)   Pleuritic chest pain   Pneumonia of left lower lobe due to infectious organism  Influenza A with concern for bacterial superinfection - Multifocal pneumonia noted on imaging.  Coarse cough with thick sputum production.  Rapid swab positive for influenza A.  Procalcitonin 100.  Profound leukocytosis on presentation Completed Tamiflu  Completed a course of empiric antibiotics Plan: Stable for dc to SNF Wean oxygen as tolerated Encourage IS and flutter use Continue antitussives Mobility as tolerated   Pleuritic chest pain-improving Continue as needed pain medication   Acute kidney injury-resolved    Normocytic anemia No indication for blood transfusion at this time     Insulin -dependent diabetes mellitus SSI and half home dose of Tresiba  (decreased 16U to 8U daily)   Acute on chronic diarrhea Resolved   COPD Not in acute exacerbation Continue as needed nebulization Will make recovery challenging  HTN Home regimen restarted with adjustments  Discharge  Instructions  Discharge Instructions     Increase activity slowly   Complete by: As directed       Allergies as of 11/22/2024       Reactions   Aspirin  Other (See Comments)   Increased bleeding   Beta Adrenergic Blockers    3rd degree blockage    Cobalamin Combinations Other (See Comments)   Respiratory symptoms  Respiratory symptoms   Lisinopril  Cough   Mtx Support [cobalamine Combinations] Other (See Comments)   Respiratory symptoms   Nsaids    Vasotec [enalapril] Cough   Verapamil  Other (See Comments)   Heart block   Voltaren [diclofenac Sodium]    Pt unsure of reaction    Erythromycin  Other (See Comments)   Gi intolerance   Indocin [indomethacin]    Upset stomach   Jardiance  [empagliflozin ] Other (See Comments)   Recurrent yeast infections, even with washout and rechallenge        Medication List     STOP taking these medications    benzonatate  100 MG capsule Commonly known as: TESSALON    escitalopram  5 MG tablet Commonly known as: Lexapro    predniSONE  10 MG (21) Tbpk tablet Commonly known as: STERAPRED UNI-PAK 21 TAB       TAKE these medications    acetaminophen  500 MG tablet Commonly known as: TYLENOL  Take 2 tablets (1,000 mg total) by mouth every 6 (six) hours as needed for mild pain.   albuterol  108 (90 Base) MCG/ACT inhaler Commonly known as: VENTOLIN  HFA Inhale 2 puffs into the lungs every 6 (six) hours as needed for wheezing or shortness of breath.   amLODipine  5 MG tablet Commonly known as: NORVASC  Take 1 tablet (5 mg total) by mouth 2 (two) times daily.  Azelastine  HCl 137 MCG/SPRAY Soln PLACE 2 SPRAYS INTO BOTH NOSTRILS 2 (TWO) TIMES DAILY. USE IN EACH NOSTRIL AS DIRECTED   chlorpheniramine-HYDROcodone  10-8 MG/5ML Commonly known as: TUSSIONEX Take 5 mLs by mouth every 12 (twelve) hours as needed for up to 10 days for cough.   Creon  36000-114000 units Cpep capsule Generic drug: lipase/protease/amylase Take 1-2 capsules  (36,000-72,000 Units total) by mouth See admin instructions. Take 720000 units with each meal and 36000 units with snacks   Entyvio  300 MG injection Generic drug: vedolizumab  Inject 300 mg into the vein 4 (four) times a week.   fluticasone -salmeterol 100-50 MCG/ACT Aepb Commonly known as: Wixela Inhub INHALE 1 PUFF INTO THE LUNGS TWICE A DAY   guaiFENesin -dextromethorphan  100-10 MG/5ML syrup Commonly known as: ROBITUSSIN DM Take 5 mLs by mouth every 4 (four) hours as needed for cough.   hydrALAZINE  10 MG tablet Commonly known as: APRESOLINE  Take 1 tablet (10 mg total) by mouth 3 (three) times daily.   ipratropium 0.03 % nasal spray Commonly known as: ATROVENT  Place 2 sprays into both nostrils every 12 (twelve) hours.   loperamide  2 MG capsule Commonly known as: IMODIUM  Take 4 mg by mouth daily.   losartan  100 MG tablet Commonly known as: COZAAR  TAKE 1 TABLET(100 MG) BY MOUTH AT BEDTIME   mometasone  0.1 % ointment Commonly known as: ELOCON  Apply 1 Application topically 2 (two) times daily.   omeprazole  20 MG capsule Commonly known as: PRILOSEC TAKE 1 CAPSULE(20 MG) BY MOUTH TWICE DAILY   ONE TOUCH ULTRA TEST test strip Generic drug: glucose blood PATIENT NEEDS NEW METER STRIPS AND LANCETS FOR ONE TOUCH. HER INSURANCE NO LONGER COVERS ACCUCHEK.   pantoprazole  40 MG tablet Commonly known as: Protonix  Take 1 tablet (40 mg total) by mouth 2 (two) times daily before a meal.   potassium chloride  10 MEQ tablet Commonly known as: KLOR-CON  M TAKE 1 TABLET BY MOUTH EVERY DAY   rosuvastatin  10 MG tablet Commonly known as: CRESTOR  Take 1 tablet (10 mg total) by mouth daily.   Tresiba  FlexTouch 100 UNIT/ML FlexTouch Pen Generic drug: insulin  degludec Inject 8 Units into the skin daily. What changed: how much to take   Vitamin D  50 MCG (2000 UT) tablet Take 2,000 Units by mouth daily.   zolpidem  12.5 MG CR tablet Commonly known as: Ambien  CR Take 1 tablet (12.5 mg  total) by mouth at bedtime as needed for sleep.        Contact information for after-discharge care     Destination     [facility] .   Service: Skilled Nursing Contact information: [address] [place]  [address] [phone]                    Allergies[1]  Consultations: None   Procedures/Studies: CT CHEST WO CONTRAST Result Date: 11/12/2024 CLINICAL DATA:  Pneumonia. EXAM: CT CHEST WITHOUT CONTRAST TECHNIQUE: Multidetector CT imaging of the chest was performed following the standard protocol without IV contrast. RADIATION DOSE REDUCTION: This exam was performed according to the departmental dose-optimization program which includes automated exposure control, adjustment of the mA and/or kV according to patient size and/or use of iterative reconstruction technique. COMPARISON:  09/11/2004. FINDINGS: Cardiovascular: Atherosclerotic calcification of the aorta, aortic valve and coronary arteries. Heart is at the upper limits of normal in size to mildly enlarged. Small pericardial effusion. Mediastinum/Nodes: No pathologically enlarged mediastinal or axillary lymph nodes. Hilar regions are difficult to definitively evaluate without IV  contrast. Air in the esophagus can be seen with dysmotility. Lungs/Pleura: Left lower lobe consolidation. Peribronchovascular nodularity with mucoid impaction and volume loss in the posterior segment right upper lobe and right lower lobe. Small left pleural effusion. Airway is unremarkable. Upper Abdomen: Liver is decreased in attenuation diffusely. Cholecystectomy. Elevated left hemidiaphragm. Visualized portions of the liver, adrenal glands, left kidney, spleen, pancreas, stomach and bowel are otherwise grossly unremarkable. No upper abdominal adenopathy. Musculoskeletal: Degenerative changes in the spine. IMPRESSION: 1. Multi lobar pneumonia, worse in the left lower lobe. Followup PA and lateral chest  X-ray is recommended in 3-4 weeks following trial of antibiotic therapy to ensure resolution and exclude underlying malignancy. 2. Small left pleural effusion. 3. Small pericardial effusion. 4. Hepatic steatosis. 5. Aortic atherosclerosis (ICD10-I70.0). Coronary artery calcification. Electronically Signed   By: Newell Eke M.D.   On: 11/12/2024 16:14   DG Chest 2 View Result Date: 11/12/2024 CLINICAL DATA:  Flu like symptoms x1 month with left-sided abdominal pain, cough and shortness of breath. EXAM: CHEST - 2 VIEW COMPARISON:  None Available. FINDINGS: The heart size and mediastinal contours are within normal limits. Mild to moderate severity infiltrate is seen within the left lung base. There is mild elevation of the left hemidiaphragm. A very small left pleural effusion is suspected. No pneumothorax is identified. The visualized skeletal structures are unremarkable. IMPRESSION: 1. Mild to moderate severity left basilar infiltrate. 2. Very small left pleural effusion. Electronically Signed   By: Suzen Dials M.D.   On: 11/12/2024 11:32      Subjective: Seen and examined on day of DC Stable, no distress Still with cough On 2L [place]  Discharge Exam: Vitals:   11/22/24 0505 11/22/24 0918  BP: (!) 157/83 (!) 139/91  Pulse: (!) 108 (!) 107  Resp: 19 18  Temp: 98.2 F (36.8 C) 98 F (36.7 C)  SpO2: 94% 96%   Vitals:   11/21/24 1850 11/21/24 2155 11/22/24 0505 11/22/24 0918  BP: (!) 160/73 (!) 161/61 (!) 157/83 (!) 139/91  Pulse: 100 89 (!) 108 (!) 107  Resp: 20 17 19 18   Temp: 98.1 F (36.7 C) 98.3 F (36.8 C) 98.2 F (36.8 C) 98 F (36.7 C)  TempSrc:  Oral Oral   SpO2: 94% 97% 94% 96%  Weight:      Height:        General: Pt is alert, awake, not in acute distress Cardiovascular: RRR, S1/S2 +, no rubs, no gallops Respiratory: Cough, with overall clear lungs.  2L Abdominal: Soft, NT, ND, bowel sounds + Extremities: no edema, no cyanosis    The results of significant  diagnostics from this hospitalization (including imaging, microbiology, ancillary and laboratory) are listed below for reference.     Microbiology: Recent Results (from the past 240 hours)  Resp panel by RT-PCR (RSV, Flu A&B, Covid) Anterior Nasal Swab     Status: Abnormal   Collection Time: 11/12/24 10:57 AM   Specimen: Anterior Nasal Swab  Result Value Ref Range Status   SARS Coronavirus 2 by RT PCR NEGATIVE NEGATIVE Final    Comment: (NOTE) SARS-CoV-2 target nucleic acids are NOT DETECTED.  The SARS-CoV-2 RNA is generally detectable in upper respiratory specimens during the acute phase of infection. The lowest concentration of SARS-CoV-2 viral copies this assay can detect is 138 copies/mL. A negative result does not preclude SARS-Cov-2 infection and should not be used as the sole basis for treatment or other patient management decisions. A negative result may occur with  improper specimen collection/handling, submission of specimen other than nasopharyngeal swab, presence of viral mutation(s) within the areas targeted by this assay, and inadequate number of viral copies(<138 copies/mL). A negative result must be combined with clinical observations, patient history, and epidemiological information. The expected result is Negative.  Fact Sheet for Patients:  bloggercourse.com  Fact Sheet for Healthcare Providers:  seriousbroker.it  This test is no t yet approved or cleared by the [place]  FDA and  has been authorized for detection and/or diagnosis of SARS-CoV-2 by FDA under an Emergency Use Authorization (EUA). This EUA will remain  in effect (meaning this test can be used) for the duration of the COVID-19 declaration under Section 564(b)(1) of the Act, 21 U.S.C.section 360bbb-3(b)(1), unless the authorization is terminated  or revoked sooner.       Influenza A by PCR POSITIVE (A) NEGATIVE Final   Influenza B by PCR  NEGATIVE NEGATIVE Final    Comment: (NOTE) The Xpert Xpress SARS-CoV-2/FLU/RSV plus assay is intended as an aid in the diagnosis of influenza from Nasopharyngeal swab specimens and should not be used as a sole basis for treatment. Nasal washings and aspirates are unacceptable for Xpert Xpress SARS-CoV-2/FLU/RSV testing.  Fact Sheet for Patients: bloggercourse.com  Fact Sheet for Healthcare Providers: seriousbroker.it  This test is not yet approved or cleared by the [place]  FDA and has been authorized for detection and/or diagnosis of SARS-CoV-2 by FDA under an Emergency Use Authorization (EUA). This EUA will remain in effect (meaning this test can be used) for the duration of the COVID-19 declaration under Section 564(b)(1) of the Act, 21 U.S.C. section 360bbb-3(b)(1), unless the authorization is terminated or revoked.     Resp Syncytial Virus by PCR NEGATIVE NEGATIVE Final    Comment: (NOTE) Fact Sheet for Patients: bloggercourse.com  Fact Sheet for Healthcare Providers: seriousbroker.it  This test is not yet approved or cleared by the [place]  FDA and has been authorized for detection and/or diagnosis of SARS-CoV-2 by FDA under an Emergency Use Authorization (EUA). This EUA will remain in effect (meaning this test can be used) for the duration of the COVID-19 declaration under Section 564(b)(1) of the Act, 21 U.S.C. section 360bbb-3(b)(1), unless the authorization is terminated or revoked.  Performed at [facility], [address] [place], [place] [address]   Culture, blood (Routine X 2) w Reflex to ID Panel     Status: None   Collection Time: 11/12/24  5:32 PM   Specimen: BLOOD  Result Value Ref Range Status   Specimen Description BLOOD LEFT ANTECUBITAL  Final   Special Requests   Final    BOTTLES DRAWN AEROBIC AND ANAEROBIC Blood Culture adequate  volume   Culture   Final    NO GROWTH 5 DAYS Performed at [facility], [address], [place], [place] [address]    Report Status 11/17/2024 FINAL  Final  Culture, blood (Routine X 2) w Reflex to ID Panel     Status: None   Collection Time: 11/12/24  5:32 PM   Specimen: BLOOD  Result Value Ref Range Status   Specimen Description BLOOD RIGHT ANTECUBITAL  Final   Special Requests   Final    BOTTLES DRAWN AEROBIC AND ANAEROBIC Blood Culture adequate volume   Culture   Final    NO GROWTH 5 DAYS Performed at [facility], [address], [place], [place] [address]    Report Status 11/17/2024 FINAL  Final  MRSA Next Gen by PCR, Nasal  Status: None   Collection Time: 11/13/24  2:09 PM  Result Value Ref Range Status   MRSA by PCR Next Gen NOT DETECTED NOT DETECTED Final    Comment: (NOTE) The GeneXpert MRSA Assay (FDA approved for NASAL specimens only), is one component of a comprehensive MRSA colonization surveillance program. It is not intended to diagnose MRSA infection nor to guide or monitor treatment for MRSA infections. Test performance is not FDA approved in patients less than 64 years old. Performed at [facility], [address] [place], [place] [address]      Labs: BNP (last 3 results) No results for input(s): BNP in the last 8760 hours. Basic Metabolic Panel: Recent Labs  Lab 11/16/24 0536 11/17/24 0518 11/20/24 0530 11/21/24 0418 11/22/24 0604  NA 134* 134* 138 136 137  K 3.6 3.9 3.8 3.9 3.7  CL 103 103 105 104 105  CO2 22 22 22 23 24   GLUCOSE 58* 109* 95 110* 104*  BUN 13 14 18 15 12   CREATININE 0.72 0.73 0.71 0.68 0.62  CALCIUM  8.0* 7.7* 7.9* 7.8* 8.2*   Liver Function Tests: No results for input(s): AST, ALT, ALKPHOS, BILITOT, PROT, ALBUMIN in the last 168 hours. No results for input(s): LIPASE, AMYLASE in the last 168 hours. No results for input(s): AMMONIA in the last 168 hours. CBC: Recent  Labs  Lab 11/16/24 0536 11/17/24 0518 11/20/24 0530 11/21/24 0418 11/22/24 0604  WBC 8.7 8.1 13.7* 11.9* 11.5*  NEUTROABS 6.7 6.5 11.0* 9.3* 9.0*  HGB 10.3* 10.5* 9.1* 8.9* 9.2*  HCT 31.3* 31.5* 28.4* 27.4* 28.2*  MCV 87.7 86.8 89.6 87.5 87.3  PLT 241 253 294 301 380   Cardiac Enzymes: No results for input(s): CKTOTAL, CKMB, CKMBINDEX, TROPONINI in the last 168 hours. BNP: Invalid input(s): POCBNP CBG: Recent Labs  Lab 11/21/24 0736 11/21/24 1220 11/21/24 1733 11/21/24 2157 11/22/24 0854  GLUCAP 110* 142* 117* 116* 106*   D-Dimer No results for input(s): DDIMER in the last 72 hours. Hgb A1c No results for input(s): HGBA1C in the last 72 hours. Lipid Profile No results for input(s): CHOL, HDL, LDLCALC, TRIG, CHOLHDL, LDLDIRECT in the last 72 hours. Thyroid  function studies No results for input(s): TSH, T4TOTAL, T3FREE, THYROIDAB in the last 72 hours.  Invalid input(s): FREET3 Anemia work up No results for input(s): VITAMINB12, FOLATE, FERRITIN, TIBC, IRON, RETICCTPCT in the last 72 hours. Urinalysis    Component Value Date/Time   COLORURINE STRAW (A) 02/19/2023 0000   APPEARANCEUR CLEAR (A) 02/19/2023 0000   LABSPEC 1.005 02/19/2023 0000   PHURINE 6.0 02/19/2023 0000   GLUCOSEU NEGATIVE 02/19/2023 0000   GLUCOSEU NEGATIVE 12/21/2022 1025   HGBUR NEGATIVE 02/19/2023 0000   BILIRUBINUR NEGATIVE 02/19/2023 0000   KETONESUR NEGATIVE 02/19/2023 0000   PROTEINUR NEGATIVE 02/19/2023 0000   UROBILINOGEN 0.2 12/21/2022 1025   NITRITE NEGATIVE 02/19/2023 0000   LEUKOCYTESUR TRACE (A) 02/19/2023 0000   Sepsis Labs Recent Labs  Lab 11/17/24 0518 11/20/24 0530 11/21/24 0418 11/22/24 0604  WBC 8.1 13.7* 11.9* 11.5*   Microbiology Recent Results (from the past 240 hours)  Resp panel by RT-PCR (RSV, Flu A&B, Covid) Anterior Nasal Swab     Status: Abnormal   Collection Time: 11/12/24 10:57 AM   Specimen: Anterior  Nasal Swab  Result Value Ref Range Status   SARS Coronavirus 2 by RT PCR NEGATIVE NEGATIVE Final    Comment: (NOTE) SARS-CoV-2 target nucleic acids are NOT DETECTED.  The SARS-CoV-2 RNA is generally detectable in upper  respiratory specimens during the acute phase of infection. The lowest concentration of SARS-CoV-2 viral copies this assay can detect is 138 copies/mL. A negative result does not preclude SARS-Cov-2 infection and should not be used as the sole basis for treatment or other patient management decisions. A negative result may occur with  improper specimen collection/handling, submission of specimen other than nasopharyngeal swab, presence of viral mutation(s) within the areas targeted by this assay, and inadequate number of viral copies(<138 copies/mL). A negative result must be combined with clinical observations, patient history, and epidemiological information. The expected result is Negative.  Fact Sheet for Patients:  bloggercourse.com  Fact Sheet for Healthcare Providers:  seriousbroker.it  This test is no t yet approved or cleared by the [place]  FDA and  has been authorized for detection and/or diagnosis of SARS-CoV-2 by FDA under an Emergency Use Authorization (EUA). This EUA will remain  in effect (meaning this test can be used) for the duration of the COVID-19 declaration under Section 564(b)(1) of the Act, 21 U.S.C.section 360bbb-3(b)(1), unless the authorization is terminated  or revoked sooner.       Influenza A by PCR POSITIVE (A) NEGATIVE Final   Influenza B by PCR NEGATIVE NEGATIVE Final    Comment: (NOTE) The Xpert Xpress SARS-CoV-2/FLU/RSV plus assay is intended as an aid in the diagnosis of influenza from Nasopharyngeal swab specimens and should not be used as a sole basis for treatment. Nasal washings and aspirates are unacceptable for Xpert Xpress SARS-CoV-2/FLU/RSV testing.  Fact Sheet  for Patients: bloggercourse.com  Fact Sheet for Healthcare Providers: seriousbroker.it  This test is not yet approved or cleared by the [place]  FDA and has been authorized for detection and/or diagnosis of SARS-CoV-2 by FDA under an Emergency Use Authorization (EUA). This EUA will remain in effect (meaning this test can be used) for the duration of the COVID-19 declaration under Section 564(b)(1) of the Act, 21 U.S.C. section 360bbb-3(b)(1), unless the authorization is terminated or revoked.     Resp Syncytial Virus by PCR NEGATIVE NEGATIVE Final    Comment: (NOTE) Fact Sheet for Patients: bloggercourse.com  Fact Sheet for Healthcare Providers: seriousbroker.it  This test is not yet approved or cleared by the [place]  FDA and has been authorized for detection and/or diagnosis of SARS-CoV-2 by FDA under an Emergency Use Authorization (EUA). This EUA will remain in effect (meaning this test can be used) for the duration of the COVID-19 declaration under Section 564(b)(1) of the Act, 21 U.S.C. section 360bbb-3(b)(1), unless the authorization is terminated or revoked.  Performed at [facility], [address] [place], [place] [address]   Culture, blood (Routine X 2) w Reflex to ID Panel     Status: None   Collection Time: 11/12/24  5:32 PM   Specimen: BLOOD  Result Value Ref Range Status   Specimen Description BLOOD LEFT ANTECUBITAL  Final   Special Requests   Final    BOTTLES DRAWN AEROBIC AND ANAEROBIC Blood Culture adequate volume   Culture   Final    NO GROWTH 5 DAYS Performed at [facility], [address], [place], [place] [address]    Report Status 11/17/2024 FINAL  Final  Culture, blood (Routine X 2) w Reflex to ID Panel     Status: None   Collection Time: 11/12/24  5:32 PM   Specimen: BLOOD  Result Value Ref Range Status   Specimen  Description BLOOD RIGHT ANTECUBITAL  Final   Special Requests   Final  BOTTLES DRAWN AEROBIC AND ANAEROBIC Blood Culture adequate volume   Culture   Final    NO GROWTH 5 DAYS Performed at [facility], [address] [place], [place] [address]    Report Status 11/17/2024 FINAL  Final  MRSA Next Gen by PCR, Nasal     Status: None   Collection Time: 11/13/24  2:09 PM  Result Value Ref Range Status   MRSA by PCR Next Gen NOT DETECTED NOT DETECTED Final    Comment: (NOTE) The GeneXpert MRSA Assay (FDA approved for NASAL specimens only), is one component of a comprehensive MRSA colonization surveillance program. It is not intended to diagnose MRSA infection nor to guide or monitor treatment for MRSA infections. Test performance is not FDA approved in patients less than 26 years old. Performed at [facility], [address] [place], [place] [address]      Time coordinating discharge: 40 min   SIGNED:   Calvin KATHEE Robson, MD  [facility] 11/22/2024, 9:50 AM Pager   If 7PM-7AM, please contact night-coverage     [1]  Allergies Allergen Reactions   Aspirin  Other (See Comments)    Increased bleeding   Beta Adrenergic Blockers     3rd degree blockage    Cobalamin Combinations Other (See Comments)    Respiratory symptoms  Respiratory symptoms   Lisinopril  Cough   Mtx Support [Cobalamine Combinations] Other (See Comments)    Respiratory symptoms   Nsaids    Vasotec [Enalapril] Cough   Verapamil  Other (See Comments)    Heart block   Voltaren [Diclofenac Sodium]     Pt unsure of reaction    Erythromycin  Other (See Comments)    Gi intolerance   Indocin [Indomethacin]     Upset stomach   Jardiance  [Empagliflozin ] Other (See Comments)    Recurrent yeast infections, even with washout and rechallenge

## 2024-11-22 NOTE — Progress Notes (Signed)
 Mobility Specialist - Progress Note   11/22/24 1221  Mobility  Activity Ambulated with assistance  Level of Assistance Contact guard assist, steadying assist  Assistive Device Front wheel walker  Distance Ambulated (ft) 24 ft  Activity Response Tolerated well  Mobility visit 1 Mobility   America Silvan Mobility Specialist 11/22/24 12:25 PM

## 2024-12-22 ENCOUNTER — Other Ambulatory Visit

## 2024-12-26 ENCOUNTER — Ambulatory Visit: Admitting: Internal Medicine

## 2025-05-11 ENCOUNTER — Ambulatory Visit
